# Patient Record
Sex: Female | Born: 1935
Health system: Southern US, Community
[De-identification: ages and names within clinical notes are randomized; demographics above are authoritative.]

## PROBLEM LIST (undated history)

## (undated) DIAGNOSIS — K279 Peptic ulcer, site unspecified, unspecified as acute or chronic, without hemorrhage or perforation: Secondary | ICD-10-CM

## (undated) DIAGNOSIS — M5136 Other intervertebral disc degeneration, lumbar region: Secondary | ICD-10-CM

## (undated) DIAGNOSIS — Z860100 Personal history of colon polyps, unspecified: Secondary | ICD-10-CM

## (undated) DIAGNOSIS — Z9889 Other specified postprocedural states: Secondary | ICD-10-CM

## (undated) DIAGNOSIS — F329 Major depressive disorder, single episode, unspecified: Secondary | ICD-10-CM

## (undated) DIAGNOSIS — T7840XA Allergy, unspecified, initial encounter: Secondary | ICD-10-CM

## (undated) DIAGNOSIS — F419 Anxiety disorder, unspecified: Secondary | ICD-10-CM

## (undated) DIAGNOSIS — Z9289 Personal history of other medical treatment: Secondary | ICD-10-CM

## (undated) DIAGNOSIS — E781 Pure hyperglyceridemia: Secondary | ICD-10-CM

## (undated) DIAGNOSIS — E538 Deficiency of other specified B group vitamins: Secondary | ICD-10-CM

## (undated) DIAGNOSIS — M797 Fibromyalgia: Secondary | ICD-10-CM

## (undated) DIAGNOSIS — F32A Depression, unspecified: Secondary | ICD-10-CM

## (undated) DIAGNOSIS — I2699 Other pulmonary embolism without acute cor pulmonale: Secondary | ICD-10-CM

## (undated) DIAGNOSIS — I5032 Chronic diastolic (congestive) heart failure: Secondary | ICD-10-CM

## (undated) DIAGNOSIS — R7301 Impaired fasting glucose: Secondary | ICD-10-CM

## (undated) DIAGNOSIS — R112 Nausea with vomiting, unspecified: Secondary | ICD-10-CM

## (undated) DIAGNOSIS — I10 Essential (primary) hypertension: Secondary | ICD-10-CM

## (undated) DIAGNOSIS — M169 Osteoarthritis of hip, unspecified: Secondary | ICD-10-CM

## (undated) DIAGNOSIS — I872 Venous insufficiency (chronic) (peripheral): Secondary | ICD-10-CM

## (undated) DIAGNOSIS — M1990 Unspecified osteoarthritis, unspecified site: Secondary | ICD-10-CM

## (undated) DIAGNOSIS — D649 Anemia, unspecified: Secondary | ICD-10-CM

## (undated) DIAGNOSIS — M47818 Spondylosis without myelopathy or radiculopathy, sacral and sacrococcygeal region: Secondary | ICD-10-CM

## (undated) HISTORY — DX: Other specified postprocedural states: Z98.890

## (undated) HISTORY — PX: ABDOMINAL HYSTERECTOMY: SHX81

## (undated) HISTORY — DX: Personal history of other medical treatment: Z92.89

## (undated) HISTORY — PX: JOINT REPLACEMENT: SHX530

## (undated) HISTORY — PX: FRACTURE SURGERY: SHX138

## (undated) HISTORY — PX: CHOLECYSTECTOMY: SHX55

## (undated) HISTORY — DX: Essential (primary) hypertension: I10

## (undated) HISTORY — DX: Anemia, unspecified: D64.9

## (undated) HISTORY — DX: Unspecified osteoarthritis, unspecified site: M19.90

## (undated) HISTORY — DX: Personal history of colon polyps, unspecified: Z86.0100

## (undated) HISTORY — PX: EXTERNAL FIXATION ANKLE FRACTURE: SHX1548

## (undated) HISTORY — DX: Venous insufficiency (chronic) (peripheral): I87.2

## (undated) HISTORY — PX: TONSILLECTOMY: SUR1361

## (undated) HISTORY — PX: TONSILLECTOMY: SHX5217

## (undated) HISTORY — DX: Peptic ulcer, site unspecified, unspecified as acute or chronic, without hemorrhage or perforation: K27.9

## (undated) HISTORY — DX: Other pulmonary embolism without acute cor pulmonale: I26.99

## (undated) HISTORY — DX: Personal history of colonic polyps: Z86.010

## (undated) HISTORY — PX: COLONOSCOPY W/ POLYPECTOMY: SHX1380

## (undated) HISTORY — DX: Deficiency of other specified B group vitamins: E53.8

## (undated) HISTORY — PX: EYE SURGERY: SHX253

## (undated) HISTORY — DX: Pure hyperglyceridemia: E78.1

## (undated) HISTORY — DX: Allergy, unspecified, initial encounter: T78.40XA

## (undated) HISTORY — DX: Impaired fasting glucose: R73.01

## (undated) MED FILL — Sodium Chloride IV Soln 0.9%: INTRAVENOUS | Qty: 250 | Status: AC

---

## 1983-07-07 HISTORY — PX: EXTERNAL FIXATION WRIST FRACTURE: SHX1553

## 2003-10-05 HISTORY — PX: CATARACT EXTRACTION: SUR2

## 2005-02-26 ENCOUNTER — Encounter: Payer: Self-pay | Admitting: Internal Medicine

## 2005-04-28 ENCOUNTER — Ambulatory Visit: Payer: Self-pay | Admitting: Internal Medicine

## 2005-05-13 ENCOUNTER — Ambulatory Visit: Payer: Self-pay | Admitting: Internal Medicine

## 2005-06-11 ENCOUNTER — Ambulatory Visit: Payer: Self-pay | Admitting: Internal Medicine

## 2005-06-30 ENCOUNTER — Ambulatory Visit: Payer: Self-pay | Admitting: Internal Medicine

## 2005-07-13 ENCOUNTER — Ambulatory Visit: Payer: Self-pay | Admitting: Internal Medicine

## 2005-08-14 ENCOUNTER — Ambulatory Visit: Payer: Self-pay | Admitting: Internal Medicine

## 2005-09-17 ENCOUNTER — Ambulatory Visit: Payer: Self-pay | Admitting: Internal Medicine

## 2005-12-16 ENCOUNTER — Ambulatory Visit: Payer: Self-pay | Admitting: Internal Medicine

## 2006-03-09 ENCOUNTER — Ambulatory Visit: Payer: Self-pay | Admitting: Internal Medicine

## 2006-03-29 ENCOUNTER — Ambulatory Visit: Payer: Self-pay | Admitting: Internal Medicine

## 2006-04-08 ENCOUNTER — Ambulatory Visit: Payer: Self-pay | Admitting: Internal Medicine

## 2006-05-14 ENCOUNTER — Ambulatory Visit: Payer: Self-pay | Admitting: Family Medicine

## 2006-07-26 ENCOUNTER — Ambulatory Visit: Payer: Self-pay | Admitting: Gynecology

## 2006-08-24 ENCOUNTER — Ambulatory Visit: Payer: Self-pay | Admitting: Family Medicine

## 2006-09-05 DIAGNOSIS — F4389 Other reactions to severe stress: Secondary | ICD-10-CM | POA: Insufficient documentation

## 2006-09-05 DIAGNOSIS — F438 Other reactions to severe stress: Secondary | ICD-10-CM

## 2006-09-10 ENCOUNTER — Ambulatory Visit: Payer: Self-pay | Admitting: Cardiology

## 2006-09-13 ENCOUNTER — Ambulatory Visit: Payer: Self-pay | Admitting: Internal Medicine

## 2006-09-30 ENCOUNTER — Encounter: Payer: Self-pay | Admitting: Internal Medicine

## 2006-09-30 DIAGNOSIS — E781 Pure hyperglyceridemia: Secondary | ICD-10-CM

## 2006-09-30 DIAGNOSIS — J301 Allergic rhinitis due to pollen: Secondary | ICD-10-CM

## 2006-09-30 DIAGNOSIS — I10 Essential (primary) hypertension: Secondary | ICD-10-CM

## 2006-09-30 DIAGNOSIS — E538 Deficiency of other specified B group vitamins: Secondary | ICD-10-CM

## 2006-09-30 DIAGNOSIS — Z8719 Personal history of other diseases of the digestive system: Secondary | ICD-10-CM

## 2006-10-06 ENCOUNTER — Encounter: Payer: Self-pay | Admitting: Internal Medicine

## 2006-10-06 ENCOUNTER — Ambulatory Visit: Payer: Self-pay

## 2006-10-07 ENCOUNTER — Ambulatory Visit: Payer: Self-pay

## 2006-10-12 ENCOUNTER — Ambulatory Visit: Payer: Self-pay | Admitting: Internal Medicine

## 2006-10-19 ENCOUNTER — Ambulatory Visit: Payer: Self-pay | Admitting: Gynecology

## 2006-10-19 ENCOUNTER — Inpatient Hospital Stay (HOSPITAL_COMMUNITY): Admission: RE | Admit: 2006-10-19 | Discharge: 2006-10-22 | Payer: Self-pay | Admitting: Gynecology

## 2006-10-19 ENCOUNTER — Encounter (INDEPENDENT_AMBULATORY_CARE_PROVIDER_SITE_OTHER): Payer: Self-pay | Admitting: Specialist

## 2006-10-25 ENCOUNTER — Ambulatory Visit: Payer: Self-pay | Admitting: Gynecology

## 2006-11-01 ENCOUNTER — Ambulatory Visit: Payer: Self-pay | Admitting: Gynecology

## 2006-11-15 ENCOUNTER — Ambulatory Visit: Payer: Self-pay | Admitting: Internal Medicine

## 2006-11-22 ENCOUNTER — Ambulatory Visit: Payer: Self-pay | Admitting: Gynecology

## 2006-11-30 ENCOUNTER — Encounter: Payer: Self-pay | Admitting: Internal Medicine

## 2006-12-03 ENCOUNTER — Encounter: Admission: RE | Admit: 2006-12-03 | Discharge: 2006-12-03 | Payer: Self-pay | Admitting: Orthopedic Surgery

## 2006-12-21 ENCOUNTER — Encounter: Admission: RE | Admit: 2006-12-21 | Discharge: 2006-12-21 | Payer: Self-pay | Admitting: Orthopedic Surgery

## 2007-05-20 ENCOUNTER — Ambulatory Visit: Payer: Self-pay | Admitting: Internal Medicine

## 2007-05-20 DIAGNOSIS — Z86010 Personal history of colonic polyps: Secondary | ICD-10-CM | POA: Insufficient documentation

## 2007-05-23 LAB — CONVERTED CEMR LAB
Albumin: 3.7 g/dL (ref 3.5–5.2)
BUN: 12 mg/dL (ref 6–23)
Calcium: 9.3 mg/dL (ref 8.4–10.5)
Eosinophils Absolute: 0.2 10*3/uL (ref 0.0–0.6)
Eosinophils Relative: 2.6 % (ref 0.0–5.0)
GFR calc Af Amer: 91 mL/min
GFR calc non Af Amer: 75 mL/min
Lymphocytes Relative: 19.2 % (ref 12.0–46.0)
MCV: 90.6 fL (ref 78.0–100.0)
Monocytes Relative: 6.1 % (ref 3.0–11.0)
Neutro Abs: 7 10*3/uL (ref 1.4–7.7)
Phosphorus: 3.4 mg/dL (ref 2.3–4.6)
Platelets: 278 10*3/uL (ref 150–400)
Potassium: 3.3 meq/L — ABNORMAL LOW (ref 3.5–5.1)
RBC: 4.12 M/uL (ref 3.87–5.11)
TSH: 0.91 microintl units/mL (ref 0.35–5.50)
WBC: 9.6 10*3/uL (ref 4.5–10.5)

## 2007-08-11 ENCOUNTER — Ambulatory Visit: Payer: Self-pay | Admitting: Internal Medicine

## 2007-08-11 DIAGNOSIS — M159 Polyosteoarthritis, unspecified: Secondary | ICD-10-CM

## 2007-08-11 DIAGNOSIS — I872 Venous insufficiency (chronic) (peripheral): Secondary | ICD-10-CM | POA: Insufficient documentation

## 2007-08-11 DIAGNOSIS — R7301 Impaired fasting glucose: Secondary | ICD-10-CM | POA: Insufficient documentation

## 2007-08-11 LAB — CONVERTED CEMR LAB
Albumin: 3.6 g/dL (ref 3.5–5.2)
BUN: 7 mg/dL (ref 6–23)
CO2: 34 meq/L — ABNORMAL HIGH (ref 19–32)
Creatinine, Ser: 0.8 mg/dL (ref 0.4–1.2)
Eosinophils Absolute: 0.3 10*3/uL (ref 0.0–0.6)
GFR calc non Af Amer: 75 mL/min
Hemoglobin: 12.5 g/dL (ref 12.0–15.0)
Lymphocytes Relative: 19 % (ref 12.0–46.0)
MCV: 87.7 fL (ref 78.0–100.0)
Monocytes Absolute: 0.7 10*3/uL (ref 0.2–0.7)
Monocytes Relative: 7.6 % (ref 3.0–11.0)
Neutro Abs: 6.8 10*3/uL (ref 1.4–7.7)
Phosphorus: 3.4 mg/dL (ref 2.3–4.6)
Platelets: 375 10*3/uL (ref 150–400)
Potassium: 3.2 meq/L — ABNORMAL LOW (ref 3.5–5.1)
Sodium: 144 meq/L (ref 135–145)

## 2007-08-12 ENCOUNTER — Ambulatory Visit: Payer: Self-pay | Admitting: Cardiology

## 2007-09-04 HISTORY — PX: TOTAL KNEE ARTHROPLASTY: SHX125

## 2007-09-14 ENCOUNTER — Encounter: Payer: Self-pay | Admitting: Internal Medicine

## 2007-09-22 ENCOUNTER — Inpatient Hospital Stay (HOSPITAL_COMMUNITY): Admission: RE | Admit: 2007-09-22 | Discharge: 2007-09-27 | Payer: Self-pay | Admitting: Orthopedic Surgery

## 2007-09-23 ENCOUNTER — Ambulatory Visit: Payer: Self-pay | Admitting: *Deleted

## 2007-09-23 ENCOUNTER — Encounter (INDEPENDENT_AMBULATORY_CARE_PROVIDER_SITE_OTHER): Payer: Self-pay | Admitting: Orthopedic Surgery

## 2007-09-27 ENCOUNTER — Encounter: Payer: Self-pay | Admitting: Internal Medicine

## 2007-10-10 ENCOUNTER — Ambulatory Visit: Payer: Self-pay | Admitting: Internal Medicine

## 2007-10-24 ENCOUNTER — Telehealth: Payer: Self-pay | Admitting: Internal Medicine

## 2007-10-25 ENCOUNTER — Ambulatory Visit: Payer: Self-pay | Admitting: Internal Medicine

## 2007-10-25 DIAGNOSIS — G479 Sleep disorder, unspecified: Secondary | ICD-10-CM | POA: Insufficient documentation

## 2007-10-26 LAB — CONVERTED CEMR LAB
ALT: 13 units/L (ref 0–35)
AST: 20 units/L (ref 0–37)
Alkaline Phosphatase: 88 units/L (ref 39–117)
BUN: 12 mg/dL (ref 6–23)
Bilirubin, Direct: 0.1 mg/dL (ref 0.0–0.3)
Creatinine, Ser: 1 mg/dL (ref 0.4–1.2)
Eosinophils Relative: 2.6 % (ref 0.0–5.0)
Glucose, Bld: 117 mg/dL — ABNORMAL HIGH (ref 70–99)
Lymphocytes Relative: 17.6 % (ref 12.0–46.0)
Monocytes Absolute: 0.7 10*3/uL (ref 0.1–1.0)
Monocytes Relative: 7.1 % (ref 3.0–12.0)
Neutrophils Relative %: 72.6 % (ref 43.0–77.0)
Phosphorus: 3.6 mg/dL (ref 2.3–4.6)
Platelets: 302 10*3/uL (ref 150–400)
Potassium: 3.8 meq/L (ref 3.5–5.1)
RDW: 13.6 % (ref 11.5–14.6)
Total Bilirubin: 0.7 mg/dL (ref 0.3–1.2)
WBC: 10.1 10*3/uL (ref 4.5–10.5)

## 2007-11-21 ENCOUNTER — Encounter: Admission: RE | Admit: 2007-11-21 | Discharge: 2007-11-21 | Payer: Self-pay | Admitting: Internal Medicine

## 2007-11-21 ENCOUNTER — Ambulatory Visit: Payer: Self-pay | Admitting: Internal Medicine

## 2007-11-21 DIAGNOSIS — R042 Hemoptysis: Secondary | ICD-10-CM | POA: Insufficient documentation

## 2007-11-21 LAB — CONVERTED CEMR LAB
BUN: 8 mg/dL (ref 6–23)
Basophils Absolute: 0 10*3/uL (ref 0.0–0.1)
Basophils Relative: 0.2 % (ref 0.0–1.0)
Calcium: 9.2 mg/dL (ref 8.4–10.5)
Creatinine, Ser: 0.9 mg/dL (ref 0.4–1.2)
Eosinophils Absolute: 0.2 10*3/uL (ref 0.0–0.7)
Eosinophils Relative: 2.6 % (ref 0.0–5.0)
HCT: 36.5 % (ref 36.0–46.0)
MCHC: 33.3 g/dL (ref 30.0–36.0)
MCV: 88.2 fL (ref 78.0–100.0)
Monocytes Absolute: 0.6 10*3/uL (ref 0.1–1.0)
Phosphorus: 3.6 mg/dL (ref 2.3–4.6)
Platelets: 339 10*3/uL (ref 150–400)
RBC: 4.13 M/uL (ref 3.87–5.11)
WBC: 9.1 10*3/uL (ref 4.5–10.5)

## 2007-12-22 ENCOUNTER — Ambulatory Visit: Payer: Self-pay | Admitting: Internal Medicine

## 2008-01-05 ENCOUNTER — Ambulatory Visit: Payer: Self-pay | Admitting: Internal Medicine

## 2008-01-09 LAB — CONVERTED CEMR LAB
Albumin: 3.3 g/dL — ABNORMAL LOW (ref 3.5–5.2)
BUN: 14 mg/dL (ref 6–23)
CO2: 30 meq/L (ref 19–32)
Calcium: 9.3 mg/dL (ref 8.4–10.5)
Chloride: 103 meq/L (ref 96–112)
Creatinine, Ser: 0.8 mg/dL (ref 0.4–1.2)
GFR calc Af Amer: 91 mL/min
GFR calc non Af Amer: 75 mL/min
Glucose, Bld: 116 mg/dL — ABNORMAL HIGH (ref 70–99)
Phosphorus: 4.2 mg/dL (ref 2.3–4.6)
Potassium: 3.3 meq/L — ABNORMAL LOW (ref 3.5–5.1)
Sodium: 141 meq/L (ref 135–145)

## 2008-02-03 ENCOUNTER — Ambulatory Visit (HOSPITAL_COMMUNITY): Admission: RE | Admit: 2008-02-03 | Discharge: 2008-02-03 | Payer: Self-pay | Admitting: Orthopedic Surgery

## 2008-02-03 ENCOUNTER — Encounter (INDEPENDENT_AMBULATORY_CARE_PROVIDER_SITE_OTHER): Payer: Self-pay | Admitting: Orthopedic Surgery

## 2008-02-03 ENCOUNTER — Ambulatory Visit: Payer: Self-pay | Admitting: Vascular Surgery

## 2008-05-25 ENCOUNTER — Ambulatory Visit: Payer: Self-pay | Admitting: Internal Medicine

## 2008-05-28 LAB — CONVERTED CEMR LAB
AST: 21 units/L (ref 0–37)
Albumin: 3.4 g/dL — ABNORMAL LOW (ref 3.5–5.2)
Alkaline Phosphatase: 63 units/L (ref 39–117)
Basophils Absolute: 0 10*3/uL (ref 0.0–0.1)
Basophils Relative: 0.4 % (ref 0.0–3.0)
CO2: 32 meq/L (ref 19–32)
Calcium: 9 mg/dL (ref 8.4–10.5)
Creatinine, Ser: 0.8 mg/dL (ref 0.4–1.2)
Eosinophils Relative: 6 % — ABNORMAL HIGH (ref 0.0–5.0)
GFR calc non Af Amer: 75 mL/min
Glucose, Bld: 121 mg/dL — ABNORMAL HIGH (ref 70–99)
HDL: 40.7 mg/dL (ref >39.0)
Lymphocytes Relative: 21.7 % (ref 12.0–46.0)
MCHC: 35.2 g/dL (ref 30.0–36.0)
Neutrophils Relative %: 65.8 % (ref 43.0–77.0)
RBC: 3.99 M/uL (ref 3.87–5.11)
Total CHOL/HDL Ratio: 4.3
VLDL: 46 mg/dL — ABNORMAL HIGH (ref 0–40)
WBC: 7.9 10*3/uL (ref 4.5–10.5)

## 2008-10-19 DIAGNOSIS — I517 Cardiomegaly: Secondary | ICD-10-CM

## 2008-10-22 ENCOUNTER — Ambulatory Visit: Payer: Self-pay | Admitting: Cardiovascular Disease

## 2008-10-22 ENCOUNTER — Encounter: Payer: Self-pay | Admitting: Cardiovascular Disease

## 2008-10-22 DIAGNOSIS — R0602 Shortness of breath: Secondary | ICD-10-CM

## 2008-11-06 ENCOUNTER — Encounter: Payer: Self-pay | Admitting: Cardiovascular Disease

## 2008-11-06 ENCOUNTER — Ambulatory Visit: Payer: Self-pay

## 2008-11-22 ENCOUNTER — Ambulatory Visit: Payer: Self-pay | Admitting: Internal Medicine

## 2008-11-26 LAB — CONVERTED CEMR LAB
CO2: 32 meq/L (ref 19–32)
Calcium: 9.2 mg/dL (ref 8.4–10.5)
Chloride: 107 meq/L (ref 96–112)
Potassium: 4 meq/L (ref 3.5–5.1)
Sodium: 142 meq/L (ref 135–145)

## 2009-05-27 ENCOUNTER — Ambulatory Visit: Payer: Self-pay | Admitting: Internal Medicine

## 2009-05-29 LAB — CONVERTED CEMR LAB
ALT: 17 units/L (ref 0–35)
AST: 23 units/L (ref 0–37)
Basophils Relative: 0.3 % (ref 0.0–3.0)
Bilirubin, Direct: 0.1 mg/dL (ref 0.0–0.3)
CO2: 32 meq/L (ref 19–32)
Calcium: 8.9 mg/dL (ref 8.4–10.5)
Chloride: 104 meq/L (ref 96–112)
Eosinophils Relative: 3.5 % (ref 0.0–5.0)
HCT: 39.6 % (ref 36.0–46.0)
Lymphs Abs: 1.8 10*3/uL (ref 0.7–4.0)
MCV: 94.4 fL (ref 78.0–100.0)
Monocytes Absolute: 0.6 10*3/uL (ref 0.1–1.0)
Monocytes Relative: 7.8 % (ref 3.0–12.0)
Potassium: 4 meq/L (ref 3.5–5.1)
RBC: 4.2 M/uL (ref 3.87–5.11)
Sodium: 143 meq/L (ref 135–145)
Total Protein: 6.2 g/dL (ref 6.0–8.3)
WBC: 8.3 10*3/uL (ref 4.5–10.5)

## 2009-06-05 HISTORY — PX: HEMIARTHROPLASTY SHOULDER FRACTURE: SUR653

## 2009-06-05 HISTORY — PX: FEMUR FRACTURE SURGERY: SHX633

## 2009-06-08 ENCOUNTER — Inpatient Hospital Stay (HOSPITAL_COMMUNITY): Admission: EM | Admit: 2009-06-08 | Discharge: 2009-06-14 | Payer: Self-pay | Admitting: Emergency Medicine

## 2009-06-10 ENCOUNTER — Telehealth: Payer: Self-pay | Admitting: Internal Medicine

## 2009-08-22 ENCOUNTER — Ambulatory Visit: Payer: Self-pay | Admitting: Internal Medicine

## 2009-08-22 DIAGNOSIS — S7290XA Unspecified fracture of unspecified femur, initial encounter for closed fracture: Secondary | ICD-10-CM | POA: Insufficient documentation

## 2009-10-21 ENCOUNTER — Encounter: Payer: Self-pay | Admitting: Internal Medicine

## 2009-11-18 ENCOUNTER — Ambulatory Visit: Payer: Self-pay | Admitting: Internal Medicine

## 2009-11-18 DIAGNOSIS — R21 Rash and other nonspecific skin eruption: Secondary | ICD-10-CM

## 2009-11-19 LAB — CONVERTED CEMR LAB
Albumin: 3.8 g/dL (ref 3.5–5.2)
Alkaline Phosphatase: 119 units/L — ABNORMAL HIGH (ref 39–117)
Basophils Absolute: 0 10*3/uL (ref 0.0–0.1)
Basophils Relative: 0.4 % (ref 0.0–3.0)
Bilirubin, Direct: 0.2 mg/dL (ref 0.0–0.3)
Creatinine, Ser: 0.8 mg/dL (ref 0.4–1.2)
Eosinophils Absolute: 0.5 10*3/uL (ref 0.0–0.7)
Glucose, Bld: 104 mg/dL — ABNORMAL HIGH (ref 70–99)
Lymphocytes Relative: 22.9 % (ref 12.0–46.0)
MCHC: 33.5 g/dL (ref 30.0–36.0)
MCV: 90.1 fL (ref 78.0–100.0)
Monocytes Absolute: 0.7 10*3/uL (ref 0.1–1.0)
Neutrophils Relative %: 64 % (ref 43.0–77.0)
Phosphorus: 3.5 mg/dL (ref 2.3–4.6)
Platelets: 282 10*3/uL (ref 150.0–400.0)
Potassium: 3.9 meq/L (ref 3.5–5.1)
RBC: 4.24 M/uL (ref 3.87–5.11)
Sodium: 142 meq/L (ref 135–145)
Total Bilirubin: 0.5 mg/dL (ref 0.3–1.2)

## 2010-01-13 ENCOUNTER — Ambulatory Visit: Payer: Self-pay | Admitting: Internal Medicine

## 2010-01-13 LAB — CONVERTED CEMR LAB
Bilirubin Urine: NEGATIVE
Blood in Urine, dipstick: NEGATIVE
Ketones, urine, test strip: NEGATIVE
Nitrite: NEGATIVE
Protein, U semiquant: NEGATIVE
Urobilinogen, UA: 0.2

## 2010-04-10 ENCOUNTER — Ambulatory Visit: Payer: Self-pay | Admitting: Cardiovascular Disease

## 2010-04-16 ENCOUNTER — Encounter: Payer: Self-pay | Admitting: Internal Medicine

## 2010-05-23 ENCOUNTER — Ambulatory Visit: Payer: Self-pay | Admitting: Internal Medicine

## 2010-06-03 ENCOUNTER — Encounter: Admission: RE | Admit: 2010-06-03 | Discharge: 2010-06-03 | Payer: Self-pay | Admitting: Orthopedic Surgery

## 2010-07-16 ENCOUNTER — Ambulatory Visit
Admission: RE | Admit: 2010-07-16 | Discharge: 2010-07-16 | Payer: Self-pay | Source: Home / Self Care | Attending: Family Medicine | Admitting: Family Medicine

## 2010-08-05 NOTE — Assessment & Plan Note (Signed)
Summary: 6 MONTH FOLLOW UP/[facility]   Vital Signs:  Patient profile:   76 year old female Weight:      256 pounds O2 Sat:      95 % on Room air Temp:     98.2 degrees F oral Pulse rate:   93 / minute Pulse rhythm:   regular BP sitting:   130 / 60  (left arm) Cuff size:   large  Vitals Entered By: Mervin Hack CMA Duncan Dull) (May 23, 2010 12:07 PM)  O2 Flow:  Room air CC: 6 month follow-up   History of Present Illness: "as good as I am going to get" Seeing Dr Carola Frost soon--will be getting right shoulder hardware out--should help mobility Still with pain in left femur Limited but thinks she is as good as it  is going to get Does try to walk around the house and walks her [employer]  No chest pain  No SOB edema is better  Chronic insomnia for a long time sleeps best in morning  5AM to 11AM or so temazepam does help some--uses every night  Allergies: 1)  Codeine Sulfate (Codeine Sulfate) 2)  Celebrex (Celecoxib) 3)  Erythromycin Base (Erythromycin Base) 4)  * Gold Shots 5)  * Pain Medications  Past History:  Past medical, surgical, family and social histories (including risk factors) reviewed for relevance to current acute and chronic problems.  Past Medical History: Reviewed history from 11/22/2008 and no changes required. Left ventricular hypertrophy Chronic venous insufficiency Hypertriglyceridemia Hypertension Impaired fasting glucose Osteoarthritis---------------------------------------------Dr Rendall Colonic polyps Gastric ulcer from NSAID Vitamin B12 deficiency Allergic rhinitis Sleep disorder  Past Surgical History: Reviewed history from 08/22/2009 and no changes required. Cholecystectomy 1977 Tonsillectomy 1952 Fx.  left wrist-fixation with pins 1985 Fx.  left ankle-fixation with pins later removed sec to infection 1985 Cataract removal OD 10/2003 LE doppler studies neg 12/06/02 Echo- EF 60% LVH 4/05 Adenosine Myo EF 70% Hysterectomy done 4/08  for breakthrough bleeding 3/09 Left TKR (Rendall) 12/10  Fractured right shoulder and left femur  Family History: Reviewed history from 05/20/2007 and no changes required. Dad died @74 ---COPD, asbestosis Mom died @84 --dementia CAD and HTN in family DM--maternal aunts Prostate cancer--brother--died 12/06/2006 Depression-mother Uterine cancer--mom (age 39)  2 brothers---1 has arthritis and asthma, hepatitis  Social History: Reviewed history from 05/20/2007 and no changes required. Retired  crossing Energy manager daughters Former Smoker--quit around Dec 05, 1993 Alcohol use-yes---rare Enjoys arts and crafts  Review of Systems       appetite is too good weight is up a few pounds  Physical Exam  General:  alert and normal appearance.   Neck:  supple, no masses, no thyromegaly, and no cervical lymphadenopathy.   Lungs:  normal respiratory effort, no intercostal retractions, no accessory muscle use, and normal breath sounds.   Heart:  normal rate, regular rhythm, no murmur, and no gallop.   Abdomen:  soft and non-tender.   Extremities:  no edema Psych:  normally interactive, good eye contact, not anxious appearing, and not depressed appearing.     Impression & Recommendations:  Problem # 1:  OSTEOARTHRITIS (ICD-715.90) Assessment Unchanged ongoing pain and disability discussed increasing her regular activity  The following medications were removed from the medication list:    Pentazocine-naloxone Hcl 50-0.5 Mg Tabs (Pentazocine-naloxone) .Marland Kitchen... As needed Her updated medication list for this problem includes:    Tramadol Hcl 50 Mg Tabs (Tramadol hcl) .Marland Kitchen... 1 by mouth three times a day as needed for pain    Tylenol Extra  Strength 500 Mg Tabs (Acetaminophen) .Marland Kitchen... Take 1-2 by mouth once daily as needed  Problem # 2:  HYPERTENSION (ICD-401.9) Assessment: Unchanged good control labs next time  Her updated medication list for this problem includes:    Triamterene-hctz 37.5-25 Mg  Tabs (Triamterene-hctz) .Marland Kitchen... Take 1 tablet by mouth once a day  BP today: 130/60 Prior BP: 122/70 (04/10/2010)  Prior 10 Yr Risk Heart Disease: Not enough information (11/15/2006)  Labs Reviewed: K+: 3.9 (11/18/2009) Creat: : 0.8 (11/18/2009)   Chol: 175 (05/25/2008)   HDL: 40.7 (05/25/2008)   LDL: DEL (05/25/2008)   TG: 232 (05/25/2008)  Problem # 3:  SLEEP DISORDER (ICD-780.50) Assessment: Comment Only does fair with the temazepam  Complete Medication List: 1)  Klor-con M20 20 Meq Tbcr (Potassium chloride crys cr) .... Take 2 by mouth every am 2)  Triamterene-hctz 37.5-25 Mg Tabs (Triamterene-hctz) .... Take 1 tablet by mouth once a day 3)  Tramadol Hcl 50 Mg Tabs (Tramadol hcl) .Marland Kitchen.. 1 by mouth three times a day as needed for pain 4)  Tylenol Extra Strength 500 Mg Tabs (Acetaminophen) .... Take 1-2 by mouth once daily as needed 5)  Temazepam 30 Mg Caps (Temazepam) .Marland Kitchen.. 1 cap at bedtime as needed 6)  Allegra Allergy 180 Mg Tabs (Fexofenadine hcl) .... Take 1 by mouth once daily  Patient Instructions: 1)  Please schedule a follow-up appointment in 6 months .  Prescriptions: ALLEGRA ALLERGY 180 MG TABS (FEXOFENADINE HCL) take 1 by mouth once daily  #90 x 3   Entered by:   Mervin Hack CMA (AAMA)   Authorized by:   Cindee Salt MD   Signed by:   Mervin Hack CMA (AAMA) on 05/23/2010   Method used:   [facility]        [facility] [id number]       [address]       [place], [place]  [address]       Ph: [phone] or [phone]       Fax: [phone]   RxID:   [id number]    Orders Added: 1)  Est. Patient Level IV [10932]    Current Allergies (reviewed today): CODEINE SULFATE (CODEINE SULFATE) CELEBREX (CELECOXIB) ERYTHROMYCIN BASE (ERYTHROMYCIN BASE) * GOLD SHOTS * PAIN MEDICATIONS

## 2010-08-05 NOTE — Assessment & Plan Note (Signed)
Summary: rov   Visit Type:  rov Primary Gamal Todisco:  Cindee Salt MD  CC:  None.  History of Present Illness: 76 yo WF with history of obesity, HTN and chronic lower extremity edema here today for a routine visit.   She has a remote history of tobacco abuse. She has had a normal myoview stress test in April of 2008. I saw her last spring and she had complaints of lower ext edema. Her echo showed normal LV function.   She is here today for follow up. She has chronic lower extremity edema but this is actually improved. She has not needed to use her Lasix. She has had no chest pain, SOB or palpitations. In December, she fell and broke her right shoulder and left femur. She has recovered from this. Echo May 2010 with normal LV function and no significant valvular abnormalities.   Current Medications (verified): 1)  Klor-Con M20 20 Meq Tbcr (Potassium Chloride Crys Cr) .... Take 2 By Mouth Every Am 2)  Triamterene-Hctz 37.5-25 Mg  Tabs (Triamterene-Hctz) .... Take 1 Tablet By Mouth Once A Day 3)  Fexofenadine Hcl 180 Mg Tabs (Fexofenadine Hcl) .Marland Kitchen.. 1 Daily As Needed For Allergies 4)  Tramadol Hcl 50 Mg Tabs (Tramadol Hcl) .Marland Kitchen.. 1 By Mouth Three Times A Day As Needed For Pain 5)  Pentazocine-Naloxone Hcl 50-0.5 Mg Tabs (Pentazocine-Naloxone) .... As Needed 6)  Tylenol Extra Strength 500 Mg Tabs (Acetaminophen) .... Take 1-2 By Mouth Once Daily As Needed 7)  Temazepam 30 Mg Caps (Temazepam) .Marland Kitchen.. 1 Cap At Bedtime As Needed  Allergies: 1)  Codeine Sulfate (Codeine Sulfate) 2)  Celebrex (Celecoxib) 3)  Erythromycin Base (Erythromycin Base) 4)  * Gold Shots 5)  * Pain Medications  Past History:  Past Medical History: Reviewed history from 11/22/2008 and no changes required. Left ventricular hypertrophy Chronic venous insufficiency Hypertriglyceridemia Hypertension Impaired fasting glucose Osteoarthritis---------------------------------------------Dr Rendall Colonic polyps Gastric  ulcer from NSAID Vitamin B12 deficiency Allergic rhinitis Sleep disorder  Social History: Reviewed history from 05/20/2007 and no changes required. Retired  crossing Energy manager daughters Former Smoker--quit around [employer] Alcohol use-yes---rare Enjoys arts and crafts  Review of Systems  The patient denies fatigue, malaise, fever, weight gain/loss, vision loss, decreased hearing, hoarseness, chest pain, palpitations, shortness of breath, prolonged cough, wheezing, sleep apnea, coughing up blood, abdominal pain, blood in stool, nausea, vomiting, diarrhea, heartburn, incontinence, blood in urine, muscle weakness, joint pain, leg swelling, rash, skin lesions, headache, fainting, dizziness, depression, anxiety, enlarged lymph nodes, easy bruising or bleeding, and environmental allergies.    Vital Signs:  Patient profile:   76 year old female Height:      62 inches Weight:      250.8 pounds BMI:     46.04 Pulse rate:   80 / minute Pulse rhythm:   irregular BP sitting:   122 / 70  (left arm) Cuff size:   large  Vitals Entered By: Danielle Rankin, CMA (April 10, 2010 3:37 PM)  Physical Exam  General:  General: Well developed, well nourished, NAD Musculoskeletal: Muscle strength 5/5 all ext Psychiatric: Mood and affect normal Neck: No JVD, no carotid bruits, no thyromegaly, no lymphadenopathy. Lungs:Clear bilaterally, no wheezes, rhonci, crackles CV: RRR no murmurs, gallops rubs Abdomen: soft, NT, ND, BS present Extremities: 1+ bilateral lower ext  edema, pulses 2+.    EKG  Procedure date:  04/10/2010  Findings:      NSR, rate 80 bpm. Poor R wave progression.   Impression &  Recommendations:  Problem # 1:  HYPERTENSION (ICD-401.9) BP is well controlled. She has stable lower ext edema. No further workup at this time.   Her updated medication list for this problem includes:    Triamterene-hctz 37.5-25 Mg Tabs (Triamterene-hctz) .Marland Kitchen... Take 1 tablet by mouth once a  day  Patient Instructions: 1)  Your physician recommends that you schedule a follow-up appointment in: 1 year 2)  Your physician recommends that you continue on your current medications as directed. Please refer to the Current Medication list given to you today.

## 2010-08-05 NOTE — Assessment & Plan Note (Signed)
Summary: 6 M F/U DLO   Vital Signs:  Patient profile:   76 year old female Weight:      261 pounds Temp:     98.1 degrees F oral Pulse rate:   76 / minute Pulse rhythm:   regular BP sitting:   110 / 60  (left arm) Cuff size:   large  Vitals Entered By: Mervin Hack CMA Duncan Dull) (Nov 18, 2009 12:14 PM) CC: 6 month follow-up   History of Present Illness: doesn't feel she has recovered from leg or shoulder fractures Ortho is considering going back in and removing hardware--limiting motion and may be adding to ongoing pain Taking tramadol mainly. Rarely uses stronger med (mostly at night)  Rough time lately at night generally does okay until past 2 nights  Having terrible rash on arms and legs very itchy cortisone creams no help has had same rash in past and prednisone has cleared (diagnosed with psoriaform rash)  No trouble with BP No chest pain No SOB  Allergies: 1)  Codeine Sulfate (Codeine Sulfate) 2)  Celebrex (Celecoxib) 3)  Erythromycin Base (Erythromycin Base) 4)  * Gold Shots 5)  * Pain Medications  Past History:  Past medical, surgical, family and social histories (including risk factors) reviewed for relevance to current acute and chronic problems.  Past Medical History: Reviewed history from 11/22/2008 and no changes required. Left ventricular hypertrophy Chronic venous insufficiency Hypertriglyceridemia Hypertension Impaired fasting glucose Osteoarthritis---------------------------------------------Dr Rendall Colonic polyps Gastric ulcer from NSAID Vitamin B12 deficiency Allergic rhinitis Sleep disorder  Past Surgical History: Reviewed history from 08/22/2009 and no changes required. Cholecystectomy 1977 Tonsillectomy 1952 Fx.  left wrist-fixation with pins 1985 Fx.  left ankle-fixation with pins later removed sec to infection 1985 Cataract removal OD 10/2003 LE doppler studies neg 12-03-02 Echo- EF 60% LVH 4/05 Adenosine Myo EF  70% Hysterectomy done 4/08 for breakthrough bleeding 3/09 Left TKR (Rendall) 12/10  Fractured right shoulder and left femur  Family History: Reviewed history from 05/20/2007 and no changes required. Dad died @74 ---COPD, asbestosis Mom died @84 --dementia CAD and HTN in family DM--maternal aunts Prostate cancer--brother--died 12-03-06 Depression-mother Uterine cancer--mom (age 4)  2 brothers---1 has arthritis and asthma, hepatitis  Social History: Reviewed history from 05/20/2007 and no changes required. 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   Neck:  supple, no masses, no thyromegaly, no carotid bruits, and no cervical lymphadenopathy.   Lungs:  normal respiratory effort and normal breath sounds.   Heart:  normal rate, regular rhythm, no murmur, and no gallop.   Extremities:  no sig edema Skin:  excoriated lesions on left arm and right leg very pruritic Psych:  normally interactive, good eye contact, not anxious appearing, and not depressed appearing.     Impression & Recommendations:  Problem # 1:  RASH AND OTHER NONSPECIFIC SKIN ERUPTION (ICD-782.1) Assessment New most likely neurodermatitis but I checked husband due to pruritic rash with widespread papules  my concern is scabies though given their both being involved  if permethrin doesn't work, will need derm eval will treat with prednisone for the itching and in case it is neuroderm  Problem # 2:  HYPERTENSION (ICD-401.9) Assessment: Unchanged  good control due for labs  The following medications were  removed from the medication list:    Furosemide 40 Mg Tabs (Furosemide) .Marland Kitchen... 1 daily as needed in morning for swelling in feet Her updated medication list  for this problem includes:    Triamterene-hctz 37.5-25 Mg Tabs (Triamterene-hctz) .Marland Kitchen... Take 1 tablet by mouth once a day  BP today: 110/60 Prior BP: 148/80 (08/22/2009)  Prior 10 Yr Risk Heart Disease: Not enough information (11/15/2006)  Labs Reviewed: K+: 4.0 (05/27/2009) Creat: : 1.0 (05/27/2009)   Chol: 175 (05/25/2008)   HDL: 40.7 (05/25/2008)   LDL: DEL (05/25/2008)   TG: 232 (05/25/2008)  Orders: TLB-Renal Function Panel (80069-RENAL) TLB-CBC Platelet - w/Differential (85025-CBCD) TLB-Hepatic/Liver Function Pnl (80076-HEPATIC) TLB-TSH (Thyroid Stimulating Hormone) (84443-TSH) Venipuncture (75643)  Problem # 3:  OSTEOARTHRITIS (ICD-715.90) Assessment: Deteriorated more ongoing pain from fractures follow up with ortho  Her updated medication list for this problem includes:    Tylenol Extra Strength 500 Mg Tabs (Acetaminophen) .Marland Kitchen... Take 1-2 by mouth once daily as needed    Tramadol Hcl 50 Mg Tabs (Tramadol hcl) .Marland Kitchen... 1 by mouth three times a day as needed for pain    Pentazocine-naloxone Hcl 50-0.5 Mg Tabs (Pentazocine-naloxone) .Marland Kitchen... Pt not sure of strength  Problem # 4:  VENOUS INSUFFICIENCY, CHRONIC (ICD-459.81) Assessment: Unchanged edema reasonably controlled  Complete Medication List: 1)  Klor-con M20 20 Meq Tbcr (Potassium chloride crys cr) .... Take 2 by mouth every am 2)  Triamterene-hctz 37.5-25 Mg Tabs (Triamterene-hctz) .... Take 1 tablet by mouth once a day 3)  Tylenol Extra Strength 500 Mg Tabs (Acetaminophen) .... Take 1-2 by mouth once daily as needed 4)  Fexofenadine Hcl 180 Mg Tabs (Fexofenadine hcl) .Marland Kitchen.. 1 daily as needed for allergies 5)  Tramadol Hcl 50 Mg Tabs (Tramadol hcl) .Marland Kitchen.. 1 by mouth three times a day as needed for pain 6)  Pentazocine-naloxone Hcl 50-0.5 Mg Tabs (Pentazocine-naloxone) .... Pt not sure of strength 7)  Permethrin 5 % Crea (Permethrin) .... Apply from neck down tonight and wash off tomorrow morning 8)  Prednisone 20 Mg Tabs  (Prednisone) .... 2 tablets daily for 1 week, then 1 tablet daily for 1 week for itching  Patient Instructions: 1)  Please schedule a follow-up appointment in 6 months .  Prescriptions: PREDNISONE 20 MG TABS (PREDNISONE) 2 tablets daily for 1 week, then 1 tablet daily for 1 week for itching  #21 x 0   Entered and Authorized by:   Cindee Salt MD   Signed by:   Cindee Salt MD on 11/18/2009   Method used:   [facility]        [facility] [id number]       [address]       [place], [place]  [address]       Ph: [phone] or [phone]       Fax: [phone]   RxID:   [id number] PERMETHRIN 5 % CREA (PERMETHRIN) apply from neck down tonight and wash off tomorrow morning  #1 bottle x 1   Entered and Authorized by:   Cindee Salt MD   Signed by:   Cindee Salt MD on 11/18/2009   Method used:   [facility]        [facility] [facility]ail)       [address]       [place], [place]  [address]       Ph: [phone] or [phone]       Fax: [phone]   RxID:  [id number]   Current Allergies (reviewed today): CODEINE SULFATE (CODEINE SULFATE) CELEBREX (CELECOXIB) ERYTHROMYCIN BASE (ERYTHROMYCIN BASE) * GOLD SHOTS * PAIN MEDICATIONS

## 2010-08-05 NOTE — Letter (Signed)
Summary: CMN for Bed Wheelchair & Commode/[facility]  CMN for Bed Wheelchair & Commode/[facility]   Imported By: Lanelle Bal 10/24/2009 08:16:33  _____________________________________________________________________  External Attachment:    Type:   Image     Comment:   External Document

## 2010-08-05 NOTE — Medication Information (Signed)
Summary: Order for Heavy Duty Wheelchair/[facility]  Order for Heavy Duty Wheelchair/[facility]   Imported By: Maryln Gottron 04/23/2010 14:57:34  _____________________________________________________________________  External Attachment:    Type:   Image     Comment:   External Document

## 2010-08-05 NOTE — Assessment & Plan Note (Signed)
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 Repaired by Dr Carola Frost to [facility] for rehab 1/9 - 1/28  Now getting home rehab through [facility] Not able to walk much Stands to tranfer Independent transferring onto toilet Dresses herself Can't shower because it is up the stairs at her home has hospital bed and bedside cammode  satisfied with tramadol for pain  Allergies: 1)  Codeine Sulfate (Codeine Sulfate) 2)  Celebrex (Celecoxib) 3)  Erythromycin Base (Erythromycin Base) 4)  * Gold Shots 5)  * Pain Medications  Past History:  Past medical, surgical, family and social histories (including risk factors) reviewed, and no changes noted (except as noted below).  Past Medical History: Reviewed history from 11/22/2008 and no changes required. Left ventricular hypertrophy Chronic venous insufficiency Hypertriglyceridemia Hypertension Impaired fasting glucose Osteoarthritis---------------------------------------------Dr Rendall Colonic polyps Gastric ulcer from NSAID Vitamin B12 deficiency Allergic rhinitis Sleep disorder  Past Surgical History: Cholecystectomy 1977 Tonsillectomy 1952 Fx.  left wrist-fixation with pins 1985 Fx.  left ankle-fixation with pins later removed sec to infection 1985 Cataract removal OD 10/2003 LE doppler studies neg 11/17/2002 Echo- EF 60% LVH 4/05 Adenosine Myo EF 70% Hysterectomy done 4/08 for breakthrough bleeding 3/09 Left TKR (Rendall) 12/10  Fractured right shoulder and left  femur  Family History: Reviewed history from 05/20/2007 and no changes required. Dad died @74 ---COPD, asbestosis Mom died @84 --dementia CAD and HTN in family DM--maternal aunts Prostate cancer--brother--died 11/17/06 Depression-mother Uterine cancer--mom (age 55)  2 brothers---1 has arthritis and asthma, hepatitis  Social History: Reviewed history from 05/20/2007 and no changes required. Retired  crossing Energy manager daughters Former Smoker--quit around 1993/11/16 Alcohol use-yes---rare Enjoys arts and crafts  Review of Systems       appetite is okay Lost 16# with the hospital and rehab stay some trouble sleeping ---hospital bed not comfortable  Physical Exam  General:  alert and normal appearance.   Neck:  supple, no masses, and no thyromegaly.   Lungs:  normal respiratory effort and normal breath sounds.   Heart:  normal rate, regular rhythm, no murmur, and no gallop.   Extremities:  thick calves without pitting Skin:  slight redness in distal part of left 3rd toe not warm or tender Psych:  normally interactive and good eye contact.     Impression & Recommendations:  Problem # 1:  FRACTURE, FEMUR (ICD-821.00) Assessment New still on home rehab slow improvement tramadol for pain   Problem # 2:  HYPERTENSION (ICD-401.9) Assessment: Unchanged reasonable control  no changes needed  Her updated medication list for this problem includes:    Triamterene-hctz 37.5-25 Mg Tabs (Triamterene-hctz) .Marland Kitchen... Take 1 tablet by mouth once a day    Furosemide 40 Mg Tabs (Furosemide) .Marland Kitchen... 1 daily as needed in morning for swelling in feet  BP today: 148/80 Prior BP: 150/88 (05/27/2009)  Prior 10 Yr Risk Heart Disease: Not enough information (11/15/2006)  Labs Reviewed:  K+: 4.0 (05/27/2009) Creat: : 1.0 (05/27/2009)   Chol: 175 (05/25/2008)   HDL: 40.7 (05/25/2008)   LDL: DEL (05/25/2008)   TG: 232 (05/25/2008)  Problem # 3:  VENOUS INSUFFICIENCY, CHRONIC  (ICD-459.81) Assessment: Unchanged stable mild edema  Complete Medication List: 1)  Klor-con M20 20 Meq Tbcr (Potassium chloride crys cr) .... Take 2 by mouth every am 2)  Triamterene-hctz 37.5-25 Mg Tabs (Triamterene-hctz) .... Take 1 tablet by mouth once a day 3)  Temazepam 15 Mg Caps (Temazepam) .Marland Kitchen.. 1-2 at bedtime as needed to help sleep 4)  Furosemide 40 Mg Tabs (Furosemide) .Marland Kitchen.. 1 daily as needed in morning for swelling in feet 5)  Tylenol Extra Strength 500 Mg Tabs (Acetaminophen) .... Take 1-2 by mouth once daily as needed 6)  Fexofenadine Hcl 180 Mg Tabs (Fexofenadine hcl) .Marland Kitchen.. 1 daily as needed for allergies 7)  Tramadol Hcl 50 Mg Tabs (Tramadol hcl) .Marland Kitchen.. 1 by mouth three times a day as needed for pain  Patient Instructions: 1)  Please schedule a follow-up appointment in 4 months .  2)  Okay to give new patient appt to grandson Ronaldo Miyamoto (he is on Medicaid)  Current Allergies (reviewed today): CODEINE SULFATE (CODEINE SULFATE) CELEBREX (CELECOXIB) ERYTHROMYCIN BASE (ERYTHROMYCIN BASE) * GOLD SHOTS * PAIN MEDICATIONS

## 2010-08-05 NOTE — Assessment & Plan Note (Signed)
Summary: SICK SINCE FRI/DLO   Vital Signs:  Patient profile:   76 year old female Weight:      251 pounds O2 Sat:      97 % on Room air Temp:     98.5 degrees F tympanic Pulse rate:   78 / minute Pulse rhythm:   regular BP sitting:   128 / 60  (left arm) Cuff size:   large  Vitals Entered By: Mervin Hack CMA (AAMA) (January 13, 2010 11:21 AM)  O2 Flow:  Room air CC: weak, nausea   History of Present Illness: "I just don't feel good" Weak, and doesn't want to do anything Nausea but still can eat Started 3 days ago  Fever--low grade--at first No cough or SOB Notes slight restriction in taking deep breath but can take on  No dysuria same urgency though  No rashes  Does not red mark on back of right ankle  No diarrhea but stomach feels off  Allergies: 1)  Codeine Sulfate (Codeine Sulfate) 2)  Celebrex (Celecoxib) 3)  Erythromycin Base (Erythromycin Base) 4)  * Gold Shots 5)  * Pain Medications  Past History:  Past medical, surgical, family and social histories (including risk factors) reviewed for relevance to current acute and chronic problems.  Past Medical History: Reviewed history from 11/22/2008 and no changes required. Left ventricular hypertrophy Chronic venous insufficiency Hypertriglyceridemia Hypertension Impaired fasting glucose Osteoarthritis---------------------------------------------Dr Rendall Colonic polyps Gastric ulcer from NSAID Vitamin B12 deficiency Allergic rhinitis Sleep disorder  Past Surgical History: Reviewed history from 08/22/2009 and no changes required. Cholecystectomy 1977 Tonsillectomy 1952 Fx.  left wrist-fixation with pins 1985 Fx.  left ankle-fixation with pins later removed sec to infection 1985 Cataract removal OD 10/2003 LE doppler studies neg 12-10-2002 Echo- EF 60% LVH 4/05 Adenosine Myo EF 70% Hysterectomy done 4/08 for breakthrough bleeding 3/09 Left TKR (Rendall) 12/10  Fractured right shoulder and left  femur  Family History: Reviewed history from 05/20/2007 and no changes required. Dad died @74 ---COPD, asbestosis Mom died @84 --dementia CAD and HTN in family DM--maternal aunts Prostate cancer--brother--died 2006-12-10 Depression-mother Uterine cancer--mom (age 77)  2 brothers---1 has arthritis and asthma, hepatitis  Social History: Reviewed history from 05/20/2007 and no changes required. Retired  crossing Energy manager daughters Former Smoker--quit around [employer] Alcohol use-yes---rare Enjoys arts and crafts  Review of Systems       Has lost 10# in the past 2 months---trying to limit sweets sleeping okay---still a night owl  Physical Exam  General:  alert.  NAD Mouth:  no erythema and no exudates.   Neck:  supple, no masses, and no cervical lymphadenopathy.   Lungs:  normal respiratory effort, no intercostal retractions, no accessory muscle use, and normal breath sounds.   Abdomen:  soft, non-tender, and normal bowel sounds.   Msk:  low back pain--chronic difficult to lie down on table Skin:  mild redness posterior right ankle---not infected Psych:  normally interactive, good eye contact, not anxious appearing, and not depressed appearing.     Impression & Recommendations:  Problem # 1:  NAUSEA (ICD-787.02) Assessment New  and generally feeling poorly exam is unrevealing urine negative onset 3 days ago---seems like self limited illness---there has been some intestinal infecitons locally  P: advised on supportive care     no Rx for now     consider reeval if not improving over the next couple of days  Orders: UA Dipstick w/o Micro (manual) (57846)  Complete Medication List: 1)  Klor-con M20 20 Meq Tbcr (  Potassium chloride crys cr) .... Take 2 by mouth every am 2)  Triamterene-hctz 37.5-25 Mg Tabs (Triamterene-hctz) .... Take 1 tablet by mouth once a day 3)  Fexofenadine Hcl 180 Mg Tabs (Fexofenadine hcl) .Marland Kitchen.. 1 daily as needed for allergies 4)  Tramadol Hcl 50  Mg Tabs (Tramadol hcl) .Marland Kitchen.. 1 by mouth three times a day as needed for pain 5)  Pentazocine-naloxone Hcl 50-0.5 Mg Tabs (Pentazocine-naloxone) .... Pt not sure of strength 6)  Tylenol Extra Strength 500 Mg Tabs (Acetaminophen) .... Take 1-2 by mouth once daily as needed  Patient Instructions: 1)  Please schedule a follow-up appointment as needed .   Current Allergies (reviewed today): CODEINE SULFATE (CODEINE SULFATE) CELEBREX (CELECOXIB) ERYTHROMYCIN BASE (ERYTHROMYCIN BASE) * GOLD SHOTS * PAIN MEDICATIONS  Laboratory Results   Urine Tests  Date/Time Received: January 13, 2010 12:04 PM Date/Time Reported: January 13, 2010 12:04 PM  Routine Urinalysis   Color: yellow Appearance: Hazy Glucose: negative   (Normal Range: Negative) Bilirubin: negative   (Normal Range: Negative) Ketone: negative   (Normal Range: Negative) Spec. Gravity: 1.015   (Normal Range: 1.003-1.035) Blood: negative   (Normal Range: Negative) pH: 6.5   (Normal Range: 5.0-8.0) Protein: negative   (Normal Range: Negative) Urobilinogen: 0.2   (Normal Range: 0-1) Nitrite: negative   (Normal Range: Negative) Leukocyte Esterace: negative   (Normal Range: Negative)

## 2010-08-07 NOTE — Assessment & Plan Note (Signed)
Summary: ??bronchitis/alc   Vital Signs:  Patient profile:   76 year old female Weight:      253.50 pounds O2 Sat:      96 % on Room air Temp:     97.9 degrees F oral Pulse rate:   80 / minute Pulse rhythm:   regular BP sitting:   134 / 62  (left arm) Cuff size:   large  Vitals Entered By: Selena Batten Dance CMA (AAMA) (July 16, 2010 9:33 AM)  O2 Flow:  Room air CC: ? Bronchitis x3 weeks   History of Present Illness: CC: recurrent bronchitis?  3wk h/o recurrent bronchitis, Started with chest congestion and nausea with some sinus pressure.  Also with chest tightness and chills.   myalgias.  s/p predpack she had laying at home, finished yesterday.  Also taking tylenol and tramadol for joint pains.  cough productive of green sputum.  + dull sinus headache.  delsym  No fevers, abd pain, v/d, rashes.  No ear pain/tooth pain.  + sick contacts at home - daughter, grandson  No smokers at home, no h/o asthma/COPD  Current Medications (verified): 1)  Klor-Con M20 20 Meq Tbcr (Potassium Chloride Crys Cr) .... Take 2 By Mouth Every Am 2)  Triamterene-Hctz 37.5-25 Mg  Tabs (Triamterene-Hctz) .... Take 1 Tablet By Mouth Once A Day 3)  Tramadol Hcl 50 Mg Tabs (Tramadol Hcl) .Marland Kitchen.. 1 By Mouth Three Times A Day As Needed For Pain 4)  Tylenol Extra Strength 500 Mg Tabs (Acetaminophen) .... Take 1-2 By Mouth Once Daily As Needed 5)  Temazepam 30 Mg Caps (Temazepam) .Marland Kitchen.. 1 Cap At Bedtime As Needed 6)  Allegra Allergy 180 Mg Tabs (Fexofenadine Hcl) .... Take 1 By Mouth Once Daily  Allergies: 1)  Codeine Sulfate (Codeine Sulfate) 2)  Celebrex (Celecoxib) 3)  Erythromycin Base (Erythromycin Base) 4)  * Gold Shots 5)  * Pain Medications  Past History:  Past Medical History: Last updated: 11/22/2008 Left ventricular hypertrophy Chronic venous insufficiency Hypertriglyceridemia Hypertension Impaired fasting glucose Osteoarthritis---------------------------------------------Dr Rendall Colonic  polyps Gastric ulcer from NSAID Vitamin B12 deficiency Allergic rhinitis Sleep disorder  Social History: Last updated: 05/20/2007 Retired  crossing Energy manager daughters Former Smoker--quit around 1995 Alcohol use-yes---rare Enjoys arts and crafts  Review of Systems       per HPI  Physical Exam  General:  alert Head:  Normocephalic and atraumatic without obvious abnormalities.  Eyes:  No corneal or conjunctival inflammation noted. EOMI. Perrla.  Ears:  TMs clear bilaterally Nose:  nares mildly congested Mouth:  MMM, no pharyngeal erythema/edema Neck:  supple, no masses, and no cervical lymphadenopathy.   Lungs:  normal respiratory effort, no intercostal retractions, no accessory muscle use, and normal breath sounds.   Heart:  normal rate, regular rhythm, no murmur, and no gallop.   Pulses:  2+ rad pulses, brisk cap refill   Impression & Recommendations:  Problem # 1:  ACUTE BRONCHITIS (ICD-466.0) Assessment New Take antibiotics.  Encouraged to push clear liquids, get enough rest, and take acetaminophen as needed.  To be seen in 5-7 days if no improvement, sooner if worse.  advised not to self medicate with left over meds such as steroids.  given recent steroids and duration of illness, treat with zpack.  Her updated medication list for this problem includes:    Zithromax Z-pak 250 Mg Tabs (Azithromycin) ..... Use as directed  Complete Medication List: 1)  Klor-con M20 20 Meq Tbcr (Potassium chloride crys cr) .... Take 2 by mouth  every am 2)  Triamterene-hctz 37.5-25 Mg Tabs (Triamterene-hctz) .... Take 1 tablet by mouth once a day 3)  Tramadol Hcl 50 Mg Tabs (Tramadol hcl) .Marland Kitchen.. 1 by mouth three times a day as needed for pain 4)  Tylenol Extra Strength 500 Mg Tabs (Acetaminophen) .... Take 1-2 by mouth once daily as needed 5)  Temazepam 30 Mg Caps (Temazepam) .Marland Kitchen.. 1 cap at bedtime as needed 6)  Allegra Allergy 180 Mg Tabs (Fexofenadine hcl) .... Take 1 by mouth once  daily 7)  Zithromax Z-pak 250 Mg Tabs (Azithromycin) .... Use as directed  Patient Instructions: 1)  Sounds like bronchitis. 2)  Treat with zpack as well as push fluids and plenty of rest. 3)  continue delsym for cough. 4)  return if fevers >101.5, worsening cough, shortness of breath. 5)  Good to see you todya, call clinci with quesitons. Prescriptions: ZITHROMAX Z-PAK 250 MG TABS (AZITHROMYCIN) use as directed  #1 x 0   Entered and Authorized by:   Eustaquio Boyden  MD   Signed by:   Eustaquio Boyden  MD on 07/16/2010   Method used:   [facility] to        [facility] [id number]       [address]       [place], [place]  [address]       Ph: [phone] or [phone]       Fax: [phone]   RxID:   [id number]    Orders Added: 1)  Est. Patient Level III [38756]    Current Allergies (reviewed today): CODEINE SULFATE (CODEINE SULFATE) CELEBREX (CELECOXIB) ERYTHROMYCIN BASE (ERYTHROMYCIN BASE) * GOLD SHOTS * PAIN MEDICATIONS  Appended Document: ??bronchitis/alc pt states has taken zpack prior without adverse event despite allergy to erythromycin  Appended Document: Orders Update    Clinical Lists Changes  Orders: Added new Service order of Prescription Created Electronically [phone]) - Signed      Appended Document: ??bronchitis/alc correction to [facility]: [facility]

## 2010-09-26 ENCOUNTER — Other Ambulatory Visit: Payer: Self-pay | Admitting: Internal Medicine

## 2010-10-07 LAB — URINE CULTURE: Colony Count: >=100000

## 2010-10-07 LAB — BASIC METABOLIC PANEL
BUN: 13 mg/dL (ref 6–23)
CO2: 27 mEq/L (ref 19–32)
CO2: 28 mEq/L (ref 19–32)
Calcium: 7.1 mg/dL — ABNORMAL LOW (ref 8.4–10.5)
Calcium: 7.6 mg/dL — ABNORMAL LOW (ref 8.4–10.5)
Calcium: 8.8 mg/dL (ref 8.4–10.5)
Chloride: 102 mEq/L (ref 96–112)
Creatinine, Ser: 0.65 mg/dL (ref 0.4–1.2)
Creatinine, Ser: 0.88 mg/dL (ref 0.4–1.2)
GFR calc Af Amer: >60 mL/min (ref >60)
GFR calc Af Amer: >60 mL/min (ref >60)
GFR calc non Af Amer: >60 mL/min (ref >60)
GFR calc non Af Amer: >60 mL/min (ref >60)
GFR calc non Af Amer: >60 mL/min (ref >60)
Glucose, Bld: 108 mg/dL — ABNORMAL HIGH (ref 70–99)
Glucose, Bld: 97 mg/dL (ref 70–99)
Potassium: 3.8 mEq/L (ref 3.5–5.1)
Sodium: 135 mEq/L (ref 135–145)
Sodium: 136 mEq/L (ref 135–145)
Sodium: 139 mEq/L (ref 135–145)

## 2010-10-07 LAB — TYPE AND SCREEN
ABO/RH(D): B POS
Antibody Screen: NEGATIVE

## 2010-10-07 LAB — POCT I-STAT 4, (NA,K, GLUC, HGB,HCT)
HCT: 28 % — ABNORMAL LOW (ref 36.0–46.0)
Sodium: 140 mEq/L (ref 135–145)

## 2010-10-07 LAB — CBC
HCT: 24.6 % — ABNORMAL LOW (ref 36.0–46.0)
HCT: 25.1 % — ABNORMAL LOW (ref 36.0–46.0)
HCT: 25.2 % — ABNORMAL LOW (ref 36.0–46.0)
HCT: 26.7 % — ABNORMAL LOW (ref 36.0–46.0)
HCT: 35.3 % — ABNORMAL LOW (ref 36.0–46.0)
Hemoglobin: 12.2 g/dL (ref 12.0–15.0)
Hemoglobin: 13.6 g/dL (ref 12.0–15.0)
Hemoglobin: 8.4 g/dL — ABNORMAL LOW (ref 12.0–15.0)
Hemoglobin: 8.5 g/dL — ABNORMAL LOW (ref 12.0–15.0)
Hemoglobin: 8.6 g/dL — ABNORMAL LOW (ref 12.0–15.0)
Hemoglobin: 9.1 g/dL — ABNORMAL LOW (ref 12.0–15.0)
MCHC: 34 g/dL (ref 30.0–36.0)
MCHC: 34.3 g/dL (ref 30.0–36.0)
MCHC: 34.4 g/dL (ref 30.0–36.0)
MCHC: 34.5 g/dL (ref 30.0–36.0)
MCV: 93.1 fL (ref 78.0–100.0)
MCV: 93.6 fL (ref 78.0–100.0)
Platelets: 167 10*3/uL (ref 150–400)
Platelets: 192 10*3/uL (ref 150–400)
Platelets: 207 10*3/uL (ref 150–400)
Platelets: 214 10*3/uL (ref 150–400)
RBC: 3.81 MIL/uL — ABNORMAL LOW (ref 3.87–5.11)
RBC: 4.27 MIL/uL (ref 3.87–5.11)
RDW: 14.1 % (ref 11.5–15.5)
RDW: 14.2 % (ref 11.5–15.5)
RDW: 14.3 % (ref 11.5–15.5)
RDW: 14.3 % (ref 11.5–15.5)
WBC: 10.7 10*3/uL — ABNORMAL HIGH (ref 4.0–10.5)
WBC: 14 10*3/uL — ABNORMAL HIGH (ref 4.0–10.5)
WBC: 16 10*3/uL — ABNORMAL HIGH (ref 4.0–10.5)

## 2010-10-07 LAB — GLUCOSE, CAPILLARY: Glucose-Capillary: 107 mg/dL — ABNORMAL HIGH (ref 70–99)

## 2010-10-07 LAB — COMPREHENSIVE METABOLIC PANEL
ALT: 24 U/L (ref 0–35)
ALT: 37 U/L — ABNORMAL HIGH (ref 0–35)
Albumin: 2.1 g/dL — ABNORMAL LOW (ref 3.5–5.2)
Alkaline Phosphatase: 65 U/L (ref 39–117)
Alkaline Phosphatase: 90 U/L (ref 39–117)
BUN: 13 mg/dL (ref 6–23)
BUN: 18 mg/dL (ref 6–23)
CO2: 27 mEq/L (ref 19–32)
Calcium: 7 mg/dL — ABNORMAL LOW (ref 8.4–10.5)
Calcium: 8.5 mg/dL (ref 8.4–10.5)
Chloride: 101 mEq/L (ref 96–112)
GFR calc non Af Amer: >60 mL/min (ref >60)
Glucose, Bld: 119 mg/dL — ABNORMAL HIGH (ref 70–99)
Glucose, Bld: 130 mg/dL — ABNORMAL HIGH (ref 70–99)
Glucose, Bld: 141 mg/dL — ABNORMAL HIGH (ref 70–99)
Potassium: 3.4 mEq/L — ABNORMAL LOW (ref 3.5–5.1)
Sodium: 132 mEq/L — ABNORMAL LOW (ref 135–145)
Sodium: 140 mEq/L (ref 135–145)
Total Bilirubin: 0.8 mg/dL (ref 0.3–1.2)
Total Protein: 4.2 g/dL — ABNORMAL LOW (ref 6.0–8.3)
Total Protein: 4.6 g/dL — ABNORMAL LOW (ref 6.0–8.3)

## 2010-10-07 LAB — PROTIME-INR
INR: 1.03 (ref 0.00–1.49)
INR: 2.04 — ABNORMAL HIGH (ref 0.00–1.49)
Prothrombin Time: 13.4 seconds (ref 11.6–15.2)
Prothrombin Time: 20.9 seconds — ABNORMAL HIGH (ref 11.6–15.2)
Prothrombin Time: 22.9 seconds — ABNORMAL HIGH (ref 11.6–15.2)

## 2010-10-07 LAB — APTT: aPTT: 26 seconds (ref 24–37)

## 2010-10-07 LAB — VITAMIN D 1,25 DIHYDROXY
Vitamin D 1, 25 (OH)2 Total: 57 pg/mL (ref 18–72)
Vitamin D3 1, 25 (OH)2: 16 pg/mL

## 2010-10-07 LAB — URINALYSIS, ROUTINE W REFLEX MICROSCOPIC
Bilirubin Urine: NEGATIVE
Glucose, UA: NEGATIVE mg/dL
Hgb urine dipstick: NEGATIVE
Nitrite: NEGATIVE
Specific Gravity, Urine: 1.024 (ref 1.005–1.030)
pH: 5 (ref 5.0–8.0)

## 2010-10-07 LAB — IRON AND TIBC
Iron: 25 ug/dL — ABNORMAL LOW (ref 42–135)
Saturation Ratios: 13 % — ABNORMAL LOW (ref 20–55)
TIBC: 194 ug/dL — ABNORMAL LOW (ref 250–470)
UIBC: 169 ug/dL

## 2010-10-07 LAB — FERRITIN: Ferritin: 134 ng/mL (ref 10–291)

## 2010-10-07 LAB — ABO/RH: ABO/RH(D): B POS

## 2010-10-07 LAB — FOLATE: Folate: 4 ng/mL

## 2010-10-07 LAB — RETICULOCYTES: RBC.: 2.85 MIL/uL — ABNORMAL LOW (ref 3.87–5.11)

## 2010-10-28 ENCOUNTER — Telehealth: Payer: Self-pay | Admitting: *Deleted

## 2010-10-28 NOTE — Telephone Encounter (Signed)
Pt has been taking her husbands temazepam 1 mg and now he is out.  Insurance will not pay for an early refill. Pt is asking if she can get her own script [facility], wants a 90 day supply.  She takes one at bedtime.

## 2010-10-29 NOTE — Telephone Encounter (Signed)
I have her listed on the 30mg  temazepam and him on the 15. Has she been taking one of his, in addition to hers??? She is already on the maximum dose and should not take more than 30  If she is out, and does okay with just her husband's 15mg , okay to change hers to 15mg ,   1-2 at bedtime prn to help sleep #180 x 0

## 2010-10-30 MED ORDER — TEMAZEPAM 15 MG PO CAPS: ORAL_CAPSULE | ORAL | Status: DC

## 2010-10-30 NOTE — Telephone Encounter (Signed)
rx was called into the pharmacy, spoke with patient and advised results.

## 2010-11-18 NOTE — Discharge Summary (Signed)
NAMEANALISSE, RANDLE               ACCOUNT NO.:  [id number]   MEDICAL RECORD NO.:  [id number]          PATIENT TYPE:  INP   LOCATION:  1520                         FACILITY:  [facility]   PHYSICIAN:  John L. Rendall, M.D.  DATE OF BIRTH:  02/25/1935   DATE OF ADMISSION:  09/22/2007  DATE OF DISCHARGE:  09/27/2007                               DISCHARGE SUMMARY   ADMISSION DIAGNOSES:  1. End-stage osteoarthritis left knee.  2. History of urinary incontinence.  3. Obesity.  4. Peripheral edema.   DISCHARGE DIAGNOSES:  1. End-stage osteoarthritis left knee status post left total knee      arthroplasty.  2. Acute blood loss anemia secondary to surgery.  3. Hypokalemia now resolved.  4. Left calf pain with negative Doppler.  5. Constipation now resolved.  6. Slow physical therapy progress.  7. History of urinary incontinence.  8. Obesity.  9. Peripheral edema.   SURGICAL PROCEDURES:  On September 22, 2007, Ms. Fortner underwent a left  total knee arthroplasty with computer navigation by Dr. Jonny Ruiz L.  Rendall  assisted by Arnoldo Morale, PA-C.  She had an LCS complete primary femoral  component cemented size standard left with an LCS complete RP insert  size standard 12.5 mm thickness.  A DePuy NBT keel tibial tray cemented  size 3 and LCS complete metal back patella size standard.   COMPLICATIONS:  None.   CONSULTANTS:  1. Physical therapy consult September 23, 2007.  2. Occupational therapy consult September 24, 2007.  3. Case management consult September 26, 2007.   HISTORY OF PRESENT ILLNESS:  This 76 year old white female patient  presented Dr. Priscille Kluver with a history of a painful left knee for quite a  while.  The pain has been a severe constant aching and stabbing with  swelling.  It has gotten worse and bothers her at night.  She has failed  conservative treatment and x-rays show end-stage arthritic changes of  the knee.  Because of this, she is presenting for a left knee  replacement.   HOSPITAL COURSE:  Ms. Rhodus tolerated her surgical procedure well  without immediate postoperative complications.  She was transferred to  the orthopedic floor.  Postop day #1, T-max was 97.7, vitals were  stable.  Hemoglobin 11, hematocrit 32.5, white count was elevated at  15.9.  Leg was neurovascular intact.  Pain was fairly well controlled  although she was having calf pain that was checked with a Doppler which  was negative for DVT.  Her potassium was low that was supplemented and  she was started on therapy per protocol.   Postop day #2, T-max 99.7, vitals stable.  Hemoglobin 10.6, hematocrit  31.1, white count 15.9.  She was switched to p.o. pain medications,  continued on therapy.   Over the next several days, she did make slow progress with therapy.  She remained afebrile, vitals were stable.  Hemoglobin/hematocrit  remained stable.  She had some difficulty with constipation that was  treated effectively with a laxative, but her white count has stayed a  little bit elevated at 11.9.   On March  24, we feel she is ready for transfer to skilled facility for  more rehab prior to going home.  She remains with a T-max of 99.7,  vitals stable.  Hemoglobin 9.3, hematocrit 26.9, white count 11.9.  Incisions well-approximated with staples, mild erythema, and she will be  started on Keflex due to the erythema and small amount of serous  drainage.  She is ready for transfer to the skilled facility later  today.   MEDICATIONS:  1. Lyrica 50 mg p.o. b.i.d. to be continued for one more week and then      discontinued.  2. Colace 100 mg p.o. b.i.d..  3. Senokot one tablet p.o. b.i.d. a.c..  4. Arixtra 2.5 mg subcu q. 8 p.m. with a last dose to be on September 28, 2007.  On March 26 she is to start one baby aspirin 81 mg p.o.      q.a.m..  5. Tenormin 25 mg p.o. q.a.m..  6. Maxzide 37.5/25 mg one tablet p.o. q.a.m.Marland Kitchen  7. K-Dur 20 mEq p.o. b.i.d. which should be continued for one more  day      and then discontinued.  8. Reglan 10 mg p.o. q.8 h p.r.n. for nausea.  9. Dilaudid 2 mg tabs 1-2 tablets p.o. q.4 h p.r.n. for pain.  10.Robaxin 500 mg 1-2 tablets p.o. q.6 h p.r.n. spasms.  11.Keflex 500 mg one tablet p.o. q.i.d. for a total of 5 days started      on March 24.   DISCHARGE INSTRUCTIONS:  1. Diet:  She can resume her regular diet.  2. Activity:  She can be out of bed weightbearing as tolerated on the      left leg with the use a walker.  She is to have home CPM 0-90      degrees 6-8 hours a day for a total of 2 weeks after surgery.  She      is to have home health PT per total knee rehab protocol.  3. Wound care:  Please clean the incision with Betadine once a day and      apply dry dressing.  Notify Dr. Priscille Kluver if temperature greater than      101.5, chills, pain unrelieved by pain medications or foul-smelling      drainage from the wound.  4. Follow-up:  She needs to follow up with Dr. Priscille Kluver in our office      on Tuesday October 04, 2007, and needs to call [phone] for that      appointment.   LABORATORY DATA:  On March 11, white count was 7.3, hemoglobin 12.5,  hematocrit 36.7 and platelets 289.  Went to  a hemoglobin low of 9.3,  hematocrit 26.9.  White count went to a high of 15.9 on March 20, and a  low of 11.9 on March 24.   Potassium dropped to a low of 3.4 on March 20.  It was then within  normal limits.  Glucose ranged from 110 on March 24 to a high of 155 on  March 11.  Albumin was low at 3.2 on March 11, total protein was low at  5.9.  These studies were not repeated during her hospitalization.   Calcium ranged from 8.4 on March 20 to 8.2 on March 22.  Urine culture  done on March 11 showed greater than 100,000 colonies per mil of  multiple bacterial and morphotypes.  Repeat urine culture showed no  growth for final report.  UA  done on March 19 showed negative nitrate,  small leukocyte esterase, 3-6 white cells, 0-2 red cells, rare   epithelials.  All other laboratory studies were within normal limits.      Legrand Pitts Duffy, P.A.      John L. Rendall, M.D.  Electronically Signed    KED/MEDQ  D:  09/27/2007  T:  09/27/2007  Job:  [id number]   cc:   [name], MD  [address] [place], [place] [address]

## 2010-11-18 NOTE — Op Note (Signed)
NAMEALITHEA, Chelsea Keller               ACCOUNT NO.:  [id number]   MEDICAL RECORD NO.:  [id number]          PATIENT TYPE:  INP   LOCATION:  1620                         FACILITY:  [facility]   PHYSICIAN:  John L. Rendall, M.D.  DATE OF BIRTH:  December 18, 1934   DATE OF PROCEDURE:  09/22/2007  DATE OF DISCHARGE:                               OPERATIVE REPORT   PREOPERATIVE DIAGNOSIS:  Osteoarthritis left knee.   POSTOPERATIVE DIAGNOSIS:  Osteoarthritis left knee.   SURGICAL PROCEDURES:  Left LCS total knee replacement with computer  navigation assistance.   SURGEON:  John L. Rendall, M.D.   ASSISTANT:  Legrand Pitts. Duffy, P.A.C.   ANESTHESIA:  General with femoral nerve block.   PATHOLOGY:  The patient has bare bone on the medial femoral condyle and  the undersurface of the patella with spurring on both sides of the  femoral condyle, medial and lateral.  She has tried all conservative  measures without success and has chronic pain.   PROCEDURE:  Under general anesthesia, the left leg was prepared with  DuraPrep and draped as a sterile field.  A proximal thigh sterile  tourniquet is used.  Leg is wrapped out with the Esmarch and the  tourniquet is used at 350 mm.  Midline incision is made.  The patella is  everted.  The knee is exposed.  A large ganglion is present at the  anterior aspect of the knee joint, right in front of the inner condylar  notch.  All of this is debrided in preparation for computer mapping.  Once debridement is done Schanz pins are placed in the superior medial  tibia through accessory punctures and in the inferior medial femur.  The  arrays are set up.  The femoral head is identified.  The medial and  lateral malleoli are identified.  Tibial mapping and then femoral  mapping are done and verified within 0.5 mm of reproducible accuracy.  Once this is completed, it is noted that the knee is 8 degrees of  flexion and 4.5 degree of varus at the start of the procedure.  Once  this is done, the computer is used to assist in planning the procedure.  A proximal tibial resection is carried out and verified within 1 degree  of anatomic accuracy.  The ligament balancing is then done and once that  is completed, anterior and posterior flare of the distal femur are  resected, again within 1 degree of accuracy.  The distal femoral cut is  then made and flexion and extension gaps are matched at 10 mm.  Debridement of the posterior recesses is then carried out and remnants  of the menisci and cruciates are removed as well as spurs off the back  of the condyle.  Once this is completed, the recessing guide is used.  The proximal tibia then was exposed with McHale and Homan retractors.  The tibia was sized at a 3 whereas the femur was a standard.  Once this  center hole with fins has been inserted on a trial basis, the 10 bearing  is inserted and the standard femur.  There is slight hyperextension to  the knee, but excellent alignment.  A 12.5 bearing gets rid of  hyperextension and the knee now is within 1 degree of anatomic alignment  and extends fully.  At this point, the patella is osteotomized and three  peg holes placed, the arrays are taken down.  Permanent components are  obtained and pressure spraying of the cortical surfaces is done.  Once  this is completed, the components are cemented in place.  Cement excess  is removed and the tourniquet is let down at an hour and 11 minutes when  the cement hardened.  The knee is then  flexed.  Excess cement about the joint is removed.  Minor debridement of  the joint is carried out.  Tourniquet let down did not stimulate a lot  of bleeding, but several vessels are cauterized.  Medium Hemovac drain  is then inserted and the knee is closed in layers with #1 Tycron, #1  Vicryl, 2-0 Vicryl and skin clips.      John L. Rendall, M.D.  Electronically Signed     JLR/MEDQ  D:  09/22/2007  T:  09/22/2007  Job:  [id number]

## 2010-11-18 NOTE — Assessment & Plan Note (Signed)
[facility]                            CARDIOLOGY OFFICE NOTE   TAYLORE, HINDE                        MRN:          [id number]  DATE:08/12/2007                            DOB:          30-Jul-1934    PRIMARY CARE PHYSICIAN:  Dr. Tillman Abide   REASON FOR VISIT:  Preoperative evaluation.   HISTORY OF PRESENT ILLNESS:  Ms. Sultan comes in for a preoperative  evaluation prior to planned left total knee replacement.  This will be  under general anesthesia and has not yet been scheduled.  I saw Ms.  Cryan back in March of last year also for a preoperative assessment  prior to a planned hysterectomy.  She has no history of obstructive  coronary artery disease although risk factors including hypertension,  previous tobacco use, and obesity.  I referred her for a Myoview which  was performed in April 2008 and was normal, demonstrating no ischemia  and an ejection fraction of 78%.  At this time Ms. Ledwith denies any  significant exertional chest pain.  She has stable NYHA class II dyspnea  on exertion.  Her electrocardiogram shows sinus rhythm with a single  premature ventricular complex, leftward axis, and nonspecific T-wave  changes.  No major changes noted in comparison to the previous tracing  from March of last year.   ALLERGIES:  CODEINE.   PRESENT MEDICATIONS:  1. Potassium 20 mg p.o. daily.  2. Triamterene/hydrochlorothiazide 37.5/25 mg p.o. daily.  3. Skelaxin 500 mg p.o. b.i.d. to t.i.d.  4. Tylenol p.r.n.   REVIEW OF SYSTEMS:  Those described in the history of present illness.  No palpitations or syncope.  No orthopnea.  She is limited by knee and  leg discomfort.   EXAMINATION:  Blood pressure 152/86, heart rate is 80, weight is 259  pounds which is down from 267 last year.  This is an obese woman in no  acute distress.  HEENT:  Conjunctivae and lids normal.  Oropharynx clear.  NECK:  Supple.  No elevated jugular venous  pressure, no loud bruits, no  thyromegaly is noted.  LUNGS:  Clear without labored breathing.  CARDIAC EXAM:  Reveals a regular rate and rhythm, somewhat distant heart  sounds.  No pericardial rub.  No S3 gallop or loud murmur.  ABDOMEN:  Obese.  Unable to palpate liver edge.  Bowel sounds present.  EXTREMITIES:  Exhibit trace edema.  No significant pitting edema.  Distal pulses 1+.  MUSCULOSKELETAL:  No kyphosis noted.  NEUROPSYCHIATRIC:  The patient alert and oriented x3.  Affect is  appropriate.   IMPRESSION AND RECOMMENDATIONS:  Preoperative assessment in a 76-year-  old woman with history of obesity, hypertension and no obvious  obstructive coronary artery disease based on available information.  She  had a reassuring Myoview in April 2008 demonstrating no ischemia and an  ejection fraction of 78%.  She is not reporting any angina or  progressive breathlessness.  I expect that she should be able to proceed  with planned knee replacement with an acceptable perioperative cardiac  risk.  No other additional cardiac  testing is planned at this time.  I  would like to have her on a perioperative beta blocker for general risk  reduction and will be starting atenolol 25 mg daily to be started  approximately a week before her surgery is planned and continue until  the prescription runs out.  If there are any cardiac issues around the  time of surgery, we can certainly see the patient in consultation as an  inpatient.     Jonelle Sidle, MD  Electronically Signed    SGM/MedQ  DD: 08/12/2007  DT: 08/13/2007  Job #: [id number]   cc:   [name], MD  Carlisle Beers. Rendall, M.D.

## 2010-11-20 ENCOUNTER — Encounter: Payer: Self-pay | Admitting: Internal Medicine

## 2010-11-21 NOTE — Assessment & Plan Note (Signed)
[facility]                            CARDIOLOGY OFFICE NOTE   Chelsea Keller, Chelsea Keller                        MRN:          [id number]  DATE:09/10/2006                            DOB:          [birth year]    PRIMARY CARE PHYSICIAN:  Dr. Tillman Abide.   REASON FOR VISIT:  Preoperative evaluation.   HISTORY OF PRESENT ILLNESS:  Chelsea Keller is a pleasant 75 year old woman  with no previously documented history of obstructive coronary artery  disease or myocardial infarction.  She has actually been evaluated in  the past by a cardiologist, Dr. Erskine Speed in [place], and back  in 2005 underwent both an echocardiogram demonstrating normal left  ventricular systolic function with mild left ventricular hypertrophy and  diastolic function as well as an adenosine nuclear stress test showing  no ischemia with an ejection fraction of 70%.  She was felt to have  fairly atypical and likely noncardiac chest comfort at that time and has  not subsequent evaluation.  Additional history includes hypertension but  no documented type 2 diabetes mellitus or hyperlipidemia.  She is being  considered for a hysterectomy, at this point not clear whether this will  be an abdominal hysterectomy or vaginal hysterectomy.  She has had  problems with menorrhagia.  From the perspective of symptoms she has a  very occasional fleeting, sharp, right-sided chest pain that is very  atypical in description as well as chronic stable NYHA Class II dyspnea  on exertion.  Her electrocardiogram today shows sinus rhythm with a left  rear axis.   ALLERGIES:  CODEINE AND PAIN MEDICATIONS.   PRESENT MEDICINES:  1. Triamterine hydrochlorothiazide 37.5/25 mg p.o. daily.  2. Klor-Con 20 mEq p.o. daily.  3. Lasix 40 mg p.o. daily p.r.n.   PAST MEDICAL HISTORY:  Is as outlined above.  The patient is also status  post left ankle surgery, cholecystectomy, and left wrist surgery.   SOCIAL  HISTORY:  The patient is married (her husband is a patient of  mine).  She has her prior tobacco use history but quit smoking 13 years  ago.  She drinks alcohol rarely.  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   FAMILY HISTORY:  Reviewed and noncontributory for premature  cardiovascular disease.  Her mother died at age 60 with dementia and  heart failure.  Father died age 5 with emphysema.  She has a brother  with colon cancer.   REVIEW OF SYSTEMS:  As described in history of present illness.  She  does have problems with anxiety.  She states that she has arthritic  discomfort and some urinary problems.  She has seasonal allergies.  She  has had no orthopnea or PND.  She states that she is under a substantial  amount of psychosocial stress due to some family problems.   EXAMINATION:  Blood pressure elevated at 170/77 today, although patient  states it is typically much better controlled.  Heart rate 82, weight is  267 pounds, height  is 5 feet 2 inches.  This is an obese woman in no  acute distress without active chest pain.  HEENT:  Conjunctivae, lids normal.  Oropharynx is clear.  NECK:  Supple without elevated jugular venous pressure or loud bruits,  no thyromegaly was noted.  LUNGS:  Clear without labored breathing.  CARDIO:  Reveals a regular rate and rhythm, no loud murmur or S3 gallop.  No pericardial rub.  ABDOMEN:  Obese, liver edge not palpable, bowel sounds present.  EXTREMITIES:  Exhibit trace edema, no marked pitting edema.  Distal  pulses are 1+.  SKIN:  Warm and dry.  MUSCULOSKELETAL:  No kyphosis noted.  NEUROPSYCHIATRIC:  The patient is alert and oriented x3.   IMPRESSION AND RECOMMENDATIONS:  1. Preoperative evaluation in a 76 year old woman with obesity,      hypertension, and prior tobacco use history.  She underwent what      sounds like a reassuring cardiac assessment in 2005, also in the       setting of a preoperative evaluation.  She has not had any followup      testing since that time.  Her electrocardiogram shows sinus rhythm      with a leftward axis.  I discussed this with her today and      recommended a 2-day (due to weight/size) adenosine Myoview for      further risk stratification.  She did not want to proceed with this      testing and refused to have anything done at this point.  I      attempted to explain to her the rationale behind the testing and      the methods involved, but she did not want to proceed at this      point.  I suspect that overall her perioperative cardiac risk would      most likely be relatively low based on her prior evaluations,      although this assessment is limited somewhat by the fact that she      has not had any follow-up ischemic testing in the last three years.      I have asked her to reconsider the matter and let us know.  We can      certainly schedule her testing if she agrees to proceed.  2. Hypertension.  Elevated today.  The patient states she is under a      lot of stress.  I have asked her to follow up with Dr. Alphonsus Sias for      this.     Jonelle Sidle, MD  Electronically Signed    SGM/MedQ  DD: 09/10/2006  DT: 09/10/2006  Job #: [id number]   cc:   Ginger Carne, MD  [name], MD

## 2010-11-21 NOTE — Discharge Summary (Signed)
NAMEFRANZISKA, Chelsea Keller               ACCOUNT NO.:  [id number]   MEDICAL RECORD NO.:  [id number]          PATIENT TYPE:  INP   LOCATION:  9320                          [facility]   PHYSICIAN:  Ginger Carne, MD  DATE OF BIRTH:  12-Jan-1935   DATE OF ADMISSION:  10/19/2006  DATE OF DISCHARGE:  10/22/2006                               DISCHARGE SUMMARY   REASON FOR HOSPITALIZATION:  Postmenopausal bleeding, genuine urinary  stress incontinence.   IN-HOSPITAL PROCEDURES:  Supracervical hysterectomy with preservation of  both tubes and ovaries and terminated Burch colposuspension.   FINAL DIAGNOSES:  1. Postmenopausal bleeding.  2. Genuine urinary stress incontinence.   HOSPITAL COURSE:  This patient is a 76 year old multiparous Caucasian  female with a 10-year history of postmenopausal bleeding which  demonstrated complex nonatypical hyperplasia of the endometrium.  The  patient is morbidly obese, weighing 274 pounds.  The patient has had a  longstanding history with appropriate workup of genuine urinary stress  incontinence.  Approximately one year prior to her surgery, the patient  was seen at which time she was advised that weight-loss would be  appropriate before attempting a Burch colposuspension.  Due to her obese  anatomy, it was inadvisable to perform a tension-free vaginal tape  procedure vaginally.  The patient was told prior to her surgery that an  attempt will be made at a Burch colposuspension; however, the anatomy  will dictate whether this procedure is possible.   The patient underwent aforementioned surgery on October 19, 2006.  Due to  the patient's obesity, a supracervical hysterectomy was safely  performed.  However, despite multiple attempts, a Burch colposuspension  could not be performed.  The patient's space of Retzius was extremely  deep, and it was difficult to safely access the demarcation of the  bladder and the vagina, in addition to concerns related  to bleeding that  may not be checked due to limitation in the ability to visualize said  space.  For this reason, the procedure was not performed.  Postoperatively, she did well.  She was afebrile, voided well.  Her  discharge hemoglobin and hematocrit was 30.5 and 10.2.  A Jackson-Pratt  drain in the subcutaneous tissue had minimal drainage on discharge.  Calves without tenderness.  Lungs clear.  The patient had a mild  productive cough which on chest x-ray revealed a focal alveolar density  in the right lower lobe.  Her white count was normal on discharge.   The patient was discharged with Dilaudid 2 mg one every 4 to 6 hours as  needed for pain, Augmentin 500 mg twice a day for community-acquired  pneumonia and Zofran 8 mg 1 every 4 to 6 hours as needed for nausea and  vomiting.  In addition, the patient will continue her home medications  including triamterene hydrochlorothiazide 37.5/25 one daily, potassium  tablets 1 daily (dose unknown).  She was advised to contact the office  for temperature elevation above 100.4 degrees Fahrenheit, increasing  incisional pain, abdominal pain, vaginal bleeding, genitourinary or  gastrointestinal complaints.  The patient was advised for care related  to her Jackson-Pratt drain.  She will return on April 21 to have said  drain removed and then 1 week later for her staples and retention  sutures to be removed.  All  questions answered to the satisfaction of said patient.  The patient  verbalized understanding of said instructions.  The patient also  verbalized this appointment and inability to perform Burch  colposuspension but was advised that due to her obesity it was unsafe to  pursue this part of the operation.      Ginger Carne, MD  Electronically Signed     SHB/MEDQ  D:  10/22/2006  T:  10/22/2006  Job:  [id number]

## 2010-11-21 NOTE — Op Note (Signed)
Chelsea Keller, Chelsea Keller               ACCOUNT NO.:  [id number]   MEDICAL RECORD NO.:  [id number]          PATIENT TYPE:  AMB   LOCATION:  SDC                           [facility]   PHYSICIAN:  Ginger Carne, MD  DATE OF BIRTH:  1935-05-09   DATE OF PROCEDURE:  10/19/2006  DATE OF DISCHARGE:                               OPERATIVE REPORT   PREOPERATIVE DIAGNOSIS:  Postmenopausal bleeding and genuine urinary  stress incontinence.   POSTOPERATIVE DIAGNOSIS:  Postmenopausal bleeding and genuine urinary  stress incontinence.   PROCEDURE:  Supracervical hysterectomy and aborted Burch colposuspension  secondary to difficult visualization due to obesity.   SURGEON:  Blima Rich, M.D.   ASSISTANTMichele Mcalpine D. Rose, M.D.   ESTIMATED BLOOD LOSS:  600 mL.   COMPLICATIONS:  None immediate.   ANESTHESIA:  General.   SPECIMEN:  Uterus and portion of cervix sent to pathology.   OPERATIVE FINDINGS:  The patient is morbidly obese with significant  central obesity.  A supracervical hysterectomy had to be performed due  to anatomical considerations.  Although the space of Retzius could be  visualized, there was not sufficient room, and the level of safety to  deal with potential bleeding was not present.  Although the space was  narrow and partially visualized, it was not visualized sufficiently to  account for any potential venous or arterial bleeding that would need to  be checked.  For this reason, it was determined to abandon the procedure  than take the risk of possible harm to the patient.   OPERATIVE PROCEDURE:  The patient was prepped and draped in usual  fashion and placed in a supine position.  Betadine solution was used for  antiseptic, and the patient was catheterized prior to procedure.  After  adequate general anesthesia, a vertical infraumbilical incision was made  and the abdomen opened.  Self-retaining retractors using a Bookwalter  system was utilized.  The ureters  were identified bilaterally.  The  round ligaments were clamped and ligated 0 Vicryl suture.  Left and  right infundibulopelvic ligaments were clamped and ligated with 0 Vicryl  suture.  The anterior and posterior peritoneal reflections were  dissected off the uterus using a standard Richardson fashion.  The  uterine vasculature was clamped and ligated with 0 Vicryl suture.  This  extended to the level of the cervix where the uterus was removed.  Due  to anatomical considerations and difficulty with visualization, this was  the safest approach to avoid heavy bleeding.  Following this, bleeding  points hemostatically checked and over closure of the cervix after  cauterizing its interior with a series of figure-of-eight 0 Vicryl  sutures.  Bleeding points hemostatically checked.  Blood clots removed.  An attempt at Burch colposuspension was made, but as described above,  due to safety considerations, the procedure was terminated.  No sutures  were placed on either side of the urethra.  Closure of the abdomen was  with a double loop PDS running suture incorporating the peritoneum and  fascia and then a series of through-and-through skin through  subcutaneous tissue  with 1-inch red Robinson tubing  was utilized to close the skin including skin staples.  A Jackson-Pratt  drain was also utilized for subcutaneous drainage.  The patient  tolerated the procedure well and returned to post-anesthesia recovery  room in excellent condition.      Ginger Carne, MD  Electronically Signed     SHB/MEDQ  D:  10/19/2006  T:  10/19/2006  Job:  [id number]

## 2010-11-21 NOTE — H&P (Signed)
Chelsea Keller, Chelsea Keller               ACCOUNT NO.:  [id number]   MEDICAL RECORD NO.:  [id number]          PATIENT TYPE:  AMB   LOCATION:  SDC                           [facility]   PHYSICIAN:  Chelsea Carne, MD  DATE OF BIRTH:  [birth year]   DATE OF ADMISSION:  DATE OF DISCHARGE:                              HISTORY & PHYSICAL   CHIEF COMPLAINT:  Metrorrhagia/slash postmenopausal bleeding and genuine  urinary stress incontinence.   HISTORY OF PRESENT ILLNESS:  This patient is a 76 year old Caucasian  female, gravida 4, para 4-0-0-4 with a 10-year history of postmenopausal  bleeding and genuine urinary stress incontinence.  She has had 2 or 3  dilation and curettage procedures in [place] as late as 2 years ago  demonstrating benign findings.  An endometrial biopsy performed in  January 2008 demonstrated an endometrial hyperplasia without atypia.  Her intrauterine cavity appeared normal on SIS transvaginal ultrasound  without polyps or without evidence for submucosal fibroids.  The patient  takes no medications to enhance her bleeding propensity and has no  personal family history of bleeding diaphyses.   Her incontinence has been longstanding and worsening over the past 8-10  years.  Her history is negative for symptoms of an overactive bladder,  urgency, frequent urination or postvoid dribbling.  She does not have  nocturia.  She has never had urological or bladder surgery in the past.  She does not have fecal incontinence.  She has no neurological,  musculoskeletal or disease states that would enhance her propensity for  incontinence and takes no medications that would worsen her incontinence  or cause incontinence.   OB-GYN HISTORY:  The patient has had four vaginal deliveries in the  past, the latest when she was at age 55.  She entered menopause about  79.  There is a normal Pap smear with past 12 months.   ALLERGIES:  CODEINE and ERYTHROMYCIN.   CURRENT  MEDICATIONS:  1. Hydrochlorothiazide one 25 mg tablet daily.  2. Potassium supplement.  3. K-Dur and Tylenol as needed.   PAST MEDICAL HISTORY:  1. Hypertension.  2. Obesity.  3. Arthritis.  4. One episode of pneumonia.   PAST SURGICAL HISTORY:  1. In 1978 she had a cholecystectomy.  2. She had pins in her ankle and fractured wrist in 1985 and 1980      respectively.   SOCIAL HISTORY:  The patient is a nonsmoker.  Denies alcohol or illicit  drug abuse.   REVIEW OF SYSTEMS:  His 14-point comprehensive review of systems is  negative.   FAMILY HISTORY:  Her mother has had ovarian cancer and hypertension.  Her father has had a myocardial infarction.   PHYSICAL EXAMINATION:  GENERAL:  This is a pleasant female in no acute  distress.  VITAL SIGNS:  Weight is 274 pounds, 5 feet 2 inches.  Blood pressure  147/85.  Pulse 88 and regular.  HEENT:  Grossly normal.  BREASTS:  Breast exam without masses, discharge, thickenings or  tenderness.  CHEST:  Clear to percussion and auscultation.  CARDIOVASCULAR:  Without murmurs or enlargements.  Regular rate and  rhythm.  EXTREMITIES, LYMPHATICS, SKIN, NEUROLOGICAL AND MUSCULOSKELETAL:  Within  normal limits.  ABDOMEN:  Demonstrates central obesity without gross hepatosplenomegaly.  PELVIC:  Normal Pap smear within the past 12 months.  EXTERNAL GENITALIA:  Vulva and vagina normal.  Cervix smooth without  erosions or lesions.  Uterus is small, anteverted and flexed.  Both  adnexa palpable, found to be normal.  There is no evidence of prolapse,  enterocele, anterior, or posterior vaginal compartment defects.   LABORATORY DATA:  Urinalysis was negative.  Residual urine volume 16 mL.  Positive cough stress test noted.   IMPRESSION:  Is postmenopausal bleeding and genuine urinary stress  incontinence.   PLAN:  The patient was given the option of having an endometrial  ablation technique which she declined.  She does not wish to live with   said bleeding in spite of her benign endometrial biopsy.  Because of the  patient's obesity and particularly long and narrow vagina, it  was  deemed to have unsafe to try and attempt a total vaginal hysterectomy  with bilateral salpingo-oophorectomy.  Nature of the abdominal  hysterectomy discussed in detail including complications.  Risks  including injuries to ureter, bowel and bladder, hemorrhage possibly  requiring blood transfusion, infection postoperatively of the skin, or  cuff cellulitis were discussed with the patient.  She understands  because of her size,  it is a possibility that she may develop a  pulmonary embolism related to same.  Appropriate precautions will be  taken, however.   Patient is a candidate for a Burch colposuspension, the nature of said  procedure discussed in detail.  Risks including possible injury to  bladder, postoperative urinary retention, urgency or failure of said  procedure in the near or far term were discussed and understood by said  patient.  She understands that her weight is an issue insofar as lower  success rate.  However, she is willing to take this risk.  The patient  understands that may be a potential for wound infection in the space of  Retzius and hemorrhage.  Ashby Dawes of said procedure again discussed in  detail.  All questions answered to satisfaction of said patient and the  patient verbalized understanding of same   The patient was cleared by Dr. Nona Dell for surgery.  She had a  normal stress nuclear study on October 07, 2006.      Chelsea Carne, MD  Electronically Signed     SHB/MEDQ  D:  10/17/2006  T:  10/17/2006  Job:  [id number]   cc:   Chelsea Schwalbe, MD  [address] [place], [place] [address]

## 2010-11-24 ENCOUNTER — Encounter: Payer: Self-pay | Admitting: Internal Medicine

## 2010-11-24 ENCOUNTER — Ambulatory Visit (INDEPENDENT_AMBULATORY_CARE_PROVIDER_SITE_OTHER): Payer: Medicare Other | Admitting: Internal Medicine

## 2010-11-24 VITALS — BP 148/76 | HR 88 | Temp 98.3°F | Wt 263.5 lb

## 2010-11-24 DIAGNOSIS — I872 Venous insufficiency (chronic) (peripheral): Secondary | ICD-10-CM

## 2010-11-24 DIAGNOSIS — I10 Essential (primary) hypertension: Secondary | ICD-10-CM

## 2010-11-24 DIAGNOSIS — L408 Other psoriasis: Secondary | ICD-10-CM

## 2010-11-24 DIAGNOSIS — G479 Sleep disorder, unspecified: Secondary | ICD-10-CM

## 2010-11-24 DIAGNOSIS — L409 Psoriasis, unspecified: Secondary | ICD-10-CM

## 2010-11-24 DIAGNOSIS — M1990 Unspecified osteoarthritis, unspecified site: Secondary | ICD-10-CM

## 2010-11-24 LAB — CBC WITH DIFFERENTIAL/PLATELET
Basophils Relative: 0.4 % (ref 0.0–3.0)
Eosinophils Relative: 3.7 % (ref 0.0–5.0)
HCT: 38.2 % (ref 36.0–46.0)
Hemoglobin: 13.1 g/dL (ref 12.0–15.0)
Lymphs Abs: 1.9 10*3/uL (ref 0.7–4.0)
MCV: 91.3 fl (ref 78.0–100.0)
Monocytes Absolute: 0.6 10*3/uL (ref 0.1–1.0)
Monocytes Relative: 6.2 % (ref 3.0–12.0)
Neutro Abs: 6.8 10*3/uL (ref 1.4–7.7)
Platelets: 268 10*3/uL (ref 150.0–400.0)
RBC: 4.18 Mil/uL (ref 3.87–5.11)
WBC: 9.7 10*3/uL (ref 4.5–10.5)

## 2010-11-24 LAB — HEPATIC FUNCTION PANEL
Albumin: 3.4 g/dL — ABNORMAL LOW (ref 3.5–5.2)
Total Bilirubin: 1 mg/dL (ref 0.3–1.2)

## 2010-11-24 LAB — BASIC METABOLIC PANEL
BUN: 18 mg/dL (ref 6–23)
Calcium: 9 mg/dL (ref 8.4–10.5)
Creatinine, Ser: 0.9 mg/dL (ref 0.4–1.2)
GFR: 67.63 mL/min (ref >60.00)

## 2010-11-24 LAB — TSH: TSH: 1.23 u[IU]/mL (ref 0.35–5.50)

## 2010-11-24 MED ORDER — TRIAMCINOLONE ACETONIDE 0.1 % EX CREA: TOPICAL_CREAM | Freq: Two times a day (BID) | CUTANEOUS | Status: DC

## 2010-11-24 NOTE — Progress Notes (Signed)
Subjective:    Patient ID: Chelsea Keller, female    DOB: 1935-02-14, 76 y.o.   MRN: [id number]  HPI Doing okay Ongoing pain in leg and shoulder "I think I am learning to live with it" Ibuprofen helps more than tylenol---400mg --"it gets me through the day" Does her shopping, cooks, housework. Daughter helps with cleaning and dishes due to her hand arthritis  Sleeps okay with the temazepam  Usually only needs 15mg   Psoriasis is "the worst I have had in my life" Has lots of sauves from the past Has tried some alcohol to dry it off  BP has been okay in general Doesn't check --"I can usually tell" No chest pain No SOB  Chronic edema--no worse  Current outpatient prescriptions:acetaminophen (TYLENOL) 500 MG tablet, Take 500-1,000 mg by mouth daily as needed.  , Disp: , Rfl: ;  fexofenadine (ALLEGRA) 180 MG tablet, Take 180 mg by mouth daily as needed. , Disp: , Rfl: ;  Ibuprofen (ADVIL) 200 MG CAPS, Take by mouth. Take 2 by mouth daily as needed , Disp: , Rfl: ;  potassium chloride SA (KLOR-CON M20) 20 MEQ tablet, Take 20 mEq by mouth 2 (two) times daily.  , Disp: , Rfl:  temazepam (RESTORIL) 15 MG capsule, Take 1-2 once daily at bedtime as needed to help sleep, Disp: 180 capsule, Rfl: 0;  traMADol (ULTRAM) 50 MG tablet, Take 50 mg by mouth 3 (three) times daily as needed.  , Disp: , Rfl: ;  triamterene-hydrochlorothiazide (MAXZIDE-25) 37.5-25 MG per tablet, TAKE 1 TABLET BY MOUTH ONCE A DAY, Disp: 90 tablet, Rfl: 3  Past Medical History  Diagnosis Date  . Left ventricular hypertrophy   . Chronic venous insufficiency   . Hypertriglyceridemia   . Hypertension   . Arthritis   . Allergy   . Impaired fasting glucose   . Hx of colonic polyps   . Gastric ulcer   . Vitamin B12 deficiency   . Sleep disorder     Past Surgical History  Procedure Date  . Cholecystectomy   . Tonsillectomy   . External fixation wrist fracture 1985    left with pins  . External fixation ankle fracture       Fx.  left ankle-fixation with pins later removed sec to infection 1985  . Cataract extraction 10/2003    OD  . Abdominal hysterectomy   . Hemiarthroplasty shoulder fracture 06/2009  . Femur fracture surgery 06/2009  . Total knee arthroplasty 03/09    left    Family History  Problem Relation Age of Onset  . Depression Mother   . Cancer Mother     uterine cancer  . Cancer Brother     prostate cancer  . Diabetes Maternal Aunt   . Arthritis Brother   . Asthma Brother     History   Social History  . Marital Status: Married    Spouse Name: N/A    Number of Children: 4  . Years of Education: N/A   Occupational History  . retired crossing guard    Social History Main Topics  . Smoking status: Former Smoker    Quit date: 07/06/1993  . Smokeless tobacco: Never Used  . Alcohol Use: Yes     rare  . Drug Use: No  . Sexually Active: Not on file   Other Topics Concern  . Not on file   Social History Narrative   Enjoys arts and crafts   Review of Systems Appetite is great Weight up a  few pounds Walks in grocery store and [employer]---not much exercise otherwise Plays with dog in yard     Objective:   Physical Exam  Constitutional: She appears well-developed and well-nourished. No distress.  Neck: Normal range of motion. Neck supple. No thyromegaly present.  Cardiovascular: Normal rate, regular rhythm and normal heart sounds.  Exam reveals no gallop.   No murmur heard.      Faint pedal pulses  Pulmonary/Chest: Effort normal and breath sounds normal. No respiratory distress. She has no wheezes. She has no rales.  Musculoskeletal: She exhibits no tenderness.       1-2+ edema to ankles  Lymphadenopathy:    She has no cervical adenopathy.  Neurological:       No focal weakness Trouble getting off table  Skin:       Psoriatic rash on lower right leg  Psychiatric: She has a normal mood and affect. Her behavior is normal. Judgment and thought content normal.           Assessment & Plan:

## 2010-12-16 ENCOUNTER — Other Ambulatory Visit: Payer: Self-pay | Admitting: Internal Medicine

## 2010-12-16 DIAGNOSIS — I10 Essential (primary) hypertension: Secondary | ICD-10-CM

## 2010-12-18 ENCOUNTER — Other Ambulatory Visit (INDEPENDENT_AMBULATORY_CARE_PROVIDER_SITE_OTHER): Payer: Medicare Other

## 2010-12-18 DIAGNOSIS — I10 Essential (primary) hypertension: Secondary | ICD-10-CM

## 2010-12-18 LAB — POTASSIUM: Potassium: 3.9 mEq/L (ref 3.5–5.1)

## 2011-02-13 ENCOUNTER — Other Ambulatory Visit: Payer: Self-pay | Admitting: Internal Medicine

## 2011-03-30 LAB — BASIC METABOLIC PANEL
BUN: 13
BUN: 14
BUN: 15
CO2: 25
CO2: 31
Calcium: 8.3 — ABNORMAL LOW
Calcium: 8.4
Chloride: 102
Chloride: 103
Chloride: 104
Creatinine, Ser: 0.75
Creatinine, Ser: 0.82
Creatinine, Ser: 0.94
GFR calc Af Amer: >60
GFR calc Af Amer: >60
GFR calc Af Amer: >60
GFR calc non Af Amer: 59 — ABNORMAL LOW
GFR calc non Af Amer: >60
Glucose, Bld: 145 — ABNORMAL HIGH
Potassium: 4
Potassium: 4.2

## 2011-03-30 LAB — CBC
HCT: 27 — ABNORMAL LOW
HCT: 31.1 — ABNORMAL LOW
MCHC: 34.2
MCHC: 34.4
MCHC: 34.5
MCV: 88.9
MCV: 88
MCV: 89
MCV: 89.5
Platelets: 289
Platelets: 223
Platelets: 238
Platelets: 245
RBC: 3.04 — ABNORMAL LOW
RBC: 3.47 — ABNORMAL LOW
RDW: 13.8
RDW: 14.2
WBC: 7.3
WBC: 12.7 — ABNORMAL HIGH
WBC: 15.9 — ABNORMAL HIGH
WBC: 15.9 — ABNORMAL HIGH

## 2011-03-30 LAB — URINALYSIS, ROUTINE W REFLEX MICROSCOPIC
Bilirubin Urine: NEGATIVE
Glucose, UA: NEGATIVE
Ketones, ur: NEGATIVE
Nitrite: NEGATIVE
Protein, ur: NEGATIVE
Specific Gravity, Urine: 1.023
Urobilinogen, UA: 1
pH: 5.5

## 2011-03-30 LAB — URINE MICROSCOPIC-ADD ON

## 2011-03-30 LAB — CROSSMATCH

## 2011-03-30 LAB — URINE CULTURE
Colony Count: >=100000
Colony Count: NO GROWTH
Colony Count: NO GROWTH
Culture: NO GROWTH
Culture: NO GROWTH
Special Requests: NEGATIVE
Special Requests: NEGATIVE
Special Requests: POSITIVE

## 2011-03-30 LAB — COMPREHENSIVE METABOLIC PANEL
ALT: 16
Alkaline Phosphatase: 72
CO2: 28
Chloride: 104
GFR calc non Af Amer: >60
Glucose, Bld: 155 — ABNORMAL HIGH
Potassium: 3.5
Sodium: 140

## 2011-03-30 LAB — PROTIME-INR
INR: 1
Prothrombin Time: 13.5

## 2011-03-30 LAB — DIFFERENTIAL
Basophils Relative: 0
Eosinophils Absolute: 0.3
Neutrophils Relative %: 70

## 2011-03-30 LAB — ABO/RH: ABO/RH(D): B POS

## 2011-06-01 ENCOUNTER — Encounter: Payer: Self-pay | Admitting: Internal Medicine

## 2011-06-01 ENCOUNTER — Ambulatory Visit (INDEPENDENT_AMBULATORY_CARE_PROVIDER_SITE_OTHER): Payer: Medicare Other | Admitting: Internal Medicine

## 2011-06-01 DIAGNOSIS — R609 Edema, unspecified: Secondary | ICD-10-CM

## 2011-06-01 DIAGNOSIS — M1990 Unspecified osteoarthritis, unspecified site: Secondary | ICD-10-CM

## 2011-06-01 DIAGNOSIS — I10 Essential (primary) hypertension: Secondary | ICD-10-CM

## 2011-06-01 DIAGNOSIS — F438 Other reactions to severe stress: Secondary | ICD-10-CM

## 2011-06-01 DIAGNOSIS — G479 Sleep disorder, unspecified: Secondary | ICD-10-CM

## 2011-06-01 NOTE — Assessment & Plan Note (Signed)
Stable with ulcers No changes

## 2011-06-01 NOTE — Progress Notes (Signed)
Subjective:    Patient ID: Chelsea Keller, female    DOB: 04/11/1935, 76 y.o.   MRN: [id number]  HPI Doing fair Ongoing right shoulder pain---the plates have been removed Plates remain in left femur Still has trouble getting around---feels it may be her weight Has not been careful with diet----is a regular stress eater  No headaches No chest pain No SOB Still does housework---no dyspnea with typical tasks  Mild edema---nothing striking Better in AM No ulcers---but still has psoriasis Tried cortisone cream--helped briefly Clindamycin cream has helped more (is alternating them)  Ongoing sleep problems Unable to get off the temazepam--tried off and then had a hard time Back to using one each night--but does try to skip nights at times  Current Outpatient Prescriptions on File Prior to Visit  Medication Sig Dispense Refill  . acetaminophen (TYLENOL) 500 MG tablet Take 500-1,000 mg by mouth daily as needed.        . fexofenadine (ALLEGRA) 180 MG tablet Take 180 mg by mouth daily as needed.       . Ibuprofen (ADVIL) 200 MG CAPS Take by mouth. Take 2 by mouth daily as needed       . KLOR-CON M20 20 MEQ tablet TAKE 1 TABLET TWICE DAILY  180 tablet  2  . temazepam (RESTORIL) 15 MG capsule Take 1-2 once daily at bedtime as needed to help sleep  180 capsule  0  . traMADol (ULTRAM) 50 MG tablet Take 100 mg by mouth daily.       Marland Kitchen triamterene-hydrochlorothiazide (MAXZIDE-25) 37.5-25 MG per tablet TAKE 1 TABLET BY MOUTH ONCE A DAY  90 tablet  3    Allergies  Allergen Reactions  . Celecoxib   . Codeine Sulfate   . Erythromycin Base     Past Medical History  Diagnosis Date  . Left ventricular hypertrophy   . Chronic venous insufficiency   . Hypertriglyceridemia   . Hypertension   . Arthritis   . Allergy   . Impaired fasting glucose   . Hx of colonic polyps   . Gastric ulcer   . Vitamin B12 deficiency   . Sleep disorder     Past Surgical History  Procedure Date  .  Cholecystectomy   . Tonsillectomy   . External fixation wrist fracture 1985    left with pins  . External fixation ankle fracture     Fx.  left ankle-fixation with pins later removed sec to infection 1985  . Cataract extraction 10/2003    OD  . Abdominal hysterectomy   . Hemiarthroplasty shoulder fracture 06/2009  . Femur fracture surgery 06/2009  . Total knee arthroplasty 03/09    left    Family History  Problem Relation Age of Onset  . Depression Mother   . Cancer Mother     uterine cancer  . Cancer Brother     prostate cancer  . Diabetes Maternal Aunt   . Arthritis Brother   . Asthma Brother     History   Social History  . Marital Status: Married    Spouse Name: N/A    Number of Children: 4  . Years of Education: N/A   Occupational History  . retired crossing guard    Social History Main Topics  . Smoking status: Former Smoker    Quit date: 07/06/1993  . Smokeless tobacco: Never Used  . Alcohol Use: Yes     rare  . Drug Use: No  . Sexually Active: Not on file  Other Topics Concern  . Not on file   Social History Narrative   Enjoys arts and crafts    Review of Systems Weight is up 6#--discussed Not depressed    Objective:   Physical Exam  Constitutional: Chelsea Keller appears well-developed. No distress.  Neck: Normal range of motion. Neck supple. No thyromegaly present.  Cardiovascular: Normal rate, regular rhythm and normal heart sounds.  Exam reveals no gallop.   No murmur heard. Pulmonary/Chest: Effort normal and breath sounds normal. No respiratory distress. Chelsea Keller has no wheezes.  Musculoskeletal: Chelsea Keller exhibits edema.       2+ tense edema  Lymphadenopathy:    Chelsea Keller has no cervical adenopathy.  Skin: Rash noted.       No ulcers Classic psoriatic rash on calves (and slight on scalp)  Psychiatric: Chelsea Keller has a normal mood and affect. Her behavior is normal. Judgment and thought content normal.          Assessment & Plan:

## 2011-06-01 NOTE — Assessment & Plan Note (Signed)
Still needs the temazepam regularly Discussed trying to skip days regularly

## 2011-06-01 NOTE — Assessment & Plan Note (Signed)
BP Readings from Last 3 Encounters:  06/01/11 156/74  11/24/10 148/76  07/16/10 134/62   Not optimal control but has been okay No changes for now  Lab Results  Component Value Date   CREATININE 0.9 11/24/2010

## 2011-06-01 NOTE — Assessment & Plan Note (Signed)
Ongoing issues Now worried about husband's aortic aneurysm which may not be able to be fixed Continues to stress eat---no easy answer

## 2011-06-01 NOTE — Assessment & Plan Note (Signed)
Uses the tramadol prn Ongoing pain in multiple sites

## 2011-06-04 ENCOUNTER — Ambulatory Visit (INDEPENDENT_AMBULATORY_CARE_PROVIDER_SITE_OTHER): Payer: Medicare Other | Admitting: Cardiovascular Disease

## 2011-06-04 ENCOUNTER — Other Ambulatory Visit: Payer: Self-pay

## 2011-06-04 ENCOUNTER — Encounter: Payer: Self-pay | Admitting: Cardiovascular Disease

## 2011-06-04 VITALS — BP 145/77 | HR 80 | Ht 62.0 in | Wt 269.0 lb

## 2011-06-04 DIAGNOSIS — R609 Edema, unspecified: Secondary | ICD-10-CM

## 2011-06-04 DIAGNOSIS — I10 Essential (primary) hypertension: Secondary | ICD-10-CM

## 2011-06-04 NOTE — Assessment & Plan Note (Signed)
Stable. She will use Lasix prn. Her LV function is known to be normal .

## 2011-06-04 NOTE — Assessment & Plan Note (Signed)
BP is elevated today. She does not want to add a new medication. She will follow her BP at home and let us know if it remains elevated.

## 2011-06-04 NOTE — Progress Notes (Signed)
History of Present Illness: 76 yo WF with history of obesity, HTN and chronic lower extremity edema here today for a routine visit.   She has a remote history of tobacco abuse. She has had a normal myoview stress test in April of 2008. I saw her last in October 2011. She has had prior complaints of lower ext edema. Her echo in May 2010 showed normal LV function and no significant valvular issues.   She is here today for follow up. She has chronic lower extremity edema but this is actually improved. She has not needed to use her Lasix. She has had no chest pain, SOB or palpitations.  Past Medical History  Diagnosis Date  . Left ventricular hypertrophy   . Chronic venous insufficiency   . Hypertriglyceridemia   . Hypertension   . Arthritis   . Allergy   . Impaired fasting glucose   . Hx of colonic polyps   . Gastric ulcer   . Vitamin B12 deficiency   . Sleep disorder     Past Surgical History  Procedure Date  . Cholecystectomy   . Tonsillectomy   . External fixation wrist fracture 1985    left with pins  . External fixation ankle fracture     Fx.  left ankle-fixation with pins later removed sec to infection 1985  . Cataract extraction 10/2003    OD  . Abdominal hysterectomy   . Hemiarthroplasty shoulder fracture 06/2009  . Femur fracture surgery 06/2009  . Total knee arthroplasty 03/09    left    Current Outpatient Prescriptions  Medication Sig Dispense Refill  . acetaminophen (TYLENOL) 500 MG tablet Take 500-1,000 mg by mouth daily as needed.        . clindamycin (CLEOCIN T) 1 % external solution Apply 1 application topically 2 (two) times daily.        . fexofenadine (ALLEGRA) 180 MG tablet Take 180 mg by mouth daily as needed.       . Ibuprofen (ADVIL) 200 MG CAPS Take by mouth. Take 2 by mouth daily as needed       . potassium chloride SA (KLOR-CON M20) 20 MEQ tablet        . temazepam (RESTORIL) 15 MG capsule Take 1-2 once daily at bedtime as needed to help sleep   180 capsule  0  . traMADol (ULTRAM) 50 MG tablet Take 100 mg by mouth daily.       Marland Kitchen triamterene-hydrochlorothiazide (MAXZIDE-25) 37.5-25 MG per tablet TAKE 1 TABLET BY MOUTH ONCE A DAY  90 tablet  3    Allergies  Allergen Reactions  . Celecoxib   . Codeine Sulfate   . Erythromycin Base     History   Social History  . Marital Status: Married    Spouse Name: N/A    Number of Children: 4  . Years of Education: N/A   Occupational History  . retired crossing guard    Social History Main Topics  . Smoking status: Former Smoker    Quit date: 07/06/1993  . Smokeless tobacco: Never Used  . Alcohol Use: Yes     rare  . Drug Use: No  . Sexually Active: Not on file   Other Topics Concern  . Not on file   Social History Narrative   Enjoys arts and crafts    Family History  Problem Relation Age of Onset  . Depression Mother   . Cancer Mother     uterine cancer  . Cancer  Brother     prostate cancer  . Diabetes Maternal Aunt   . Arthritis Brother   . Asthma Brother     Review of Systems:  As stated in the HPI and otherwise negative.   BP 145/77  Pulse 80  Ht 5\' 2"  (1.575 m)  Wt 269 lb (122.018 kg)  BMI 49.20 kg/m2  Physical Examination: General: Well developed, well nourished, NAD HEENT: OP clear, mucus membranes moist SKIN: warm, dry. No rashes. Neuro: No focal deficits Musculoskeletal: Muscle strength 5/5 all ext Psychiatric: Mood and affect normal Neck: No JVD, no carotid bruits, no thyromegaly, no lymphadenopathy. Lungs:Clear bilaterally, no wheezes, rhonci, crackles Cardiovascular: Regular rate and rhythm. No murmurs, gallops or rubs. Abdomen:Soft. Bowel sounds present. Non-tender.  Extremities: Trace bilateral lower extremity edema. Pulses are 2 + in the bilateral DP/PT.  EKG:NSR, rate 73 bpm. LAD. Low voltage.

## 2011-06-04 NOTE — Patient Instructions (Signed)
Your physician wants you to follow-up in: 12 months.  You will receive a reminder letter in the mail two months in advance. If you don't receive a letter, please call our office to schedule the follow-up appointment.  Your physician recommends that you continue on your current medications as directed. Please refer to the Current Medication list given to you today.   

## 2011-06-04 NOTE — Telephone Encounter (Signed)
Pt said she was seen by Dr Alphonsus Sias on 06/01/11 and thought Tramadol and Clindamycin external solution was going to be refilled at [facility]. Med was not sent in and pt request refill. Pt can be reached at [phone]. Pt said Friday would be OK for refill.

## 2011-06-05 ENCOUNTER — Telehealth: Payer: Self-pay | Admitting: Internal Medicine

## 2011-06-05 MED ORDER — CLINDAMYCIN PHOSPHATE 1 % EX SOLN: 1.0000 "application " | Freq: Two times a day (BID) | CUTANEOUS | Status: DC

## 2011-06-05 MED ORDER — TRAMADOL HCL 50 MG PO TABS: 50.0000 mg | ORAL_TABLET | Freq: Every day | ORAL | Status: DC

## 2011-06-05 NOTE — Telephone Encounter (Signed)
Patient advised as instructed via telephone.  Patient verified that she takes one tablet by mouth daily, sometimes two if she needs it but not often.

## 2011-06-05 NOTE — Telephone Encounter (Signed)
Please let her know that the prescriptions have been sent Verify the dose on the tramadol---I did not see a previous prescription

## 2011-06-05 NOTE — Telephone Encounter (Signed)
Left message on machine at home for patient to return call. 

## 2011-06-05 NOTE — Telephone Encounter (Signed)
See previous phone note.  This has already been taken care of.

## 2011-06-05 NOTE — Telephone Encounter (Signed)
Patient returned call from [facility] nurse.  Needs med today.  Call back is [phone]

## 2011-06-16 ENCOUNTER — Ambulatory Visit: Payer: Medicare Other | Admitting: Cardiovascular Disease

## 2011-06-19 ENCOUNTER — Ambulatory Visit (INDEPENDENT_AMBULATORY_CARE_PROVIDER_SITE_OTHER): Payer: Medicare Other | Admitting: Family Medicine

## 2011-06-19 ENCOUNTER — Encounter: Payer: Self-pay | Admitting: Family Medicine

## 2011-06-19 VITALS — BP 140/64 | HR 96 | Temp 98.8°F | Wt 272.0 lb

## 2011-06-19 DIAGNOSIS — J019 Acute sinusitis, unspecified: Secondary | ICD-10-CM

## 2011-06-19 MED ORDER — AMOXICILLIN-POT CLAVULANATE 875-125 MG PO TABS: 1.0000 | ORAL_TABLET | Freq: Two times a day (BID) | ORAL | Status: AC

## 2011-06-19 NOTE — Patient Instructions (Signed)
You have a sinus infection. Take medicine as prescribed: augmentin twice daily for 10 days Push fluids and plenty of rest. Nasal saline irrigation or neti pot to help drain sinuses. May use simple mucinex with plenty of fluid to help mobilize mucous. Let us know if fever >101.5, trouble opening/closing mouth, difficulty swallowing, or worsening - you may need to be seen again. 

## 2011-06-19 NOTE — Progress Notes (Signed)
  Subjective:    Patient ID: Chelsea Keller, female    DOB: 04/24/1935, 76 y.o.   MRN: [id number]  HPI CC: cough, congestion  2 wk h/o sinus congestion, cough.  Not significant chest congestion.  Cough overall dry.  + facial pressure and pain.  Blowing nose with green/yellow mucous.  + HA.  + PNdrainage and chills.  + body aches.  So far has tried Care[employer] adviser as well as tylenol cold.  No fevers.  No abd pain, nausea/vomiting, diarrhea.  No ST, fevers/chills.  + sick contacts at home.  No smokers at home.  No h/o asthma/copd.  Review of Systems Per HPI    Objective:   Physical Exam  Vitals reviewed. Constitutional: She appears well-developed and well-nourished. No distress.       Evidently congested  HENT:  Head: Normocephalic and atraumatic.  Right Ear: Hearing, tympanic membrane, external ear and ear canal normal.  Left Ear: Hearing, tympanic membrane, external ear and ear canal normal.  Nose: No mucosal edema or rhinorrhea. Right sinus exhibits no maxillary sinus tenderness and no frontal sinus tenderness. Left sinus exhibits no maxillary sinus tenderness and no frontal sinus tenderness.  Mouth/Throat: Uvula is midline, oropharynx is clear and moist and mucous membranes are normal. No oropharyngeal exudate, posterior oropharyngeal edema, posterior oropharyngeal erythema or tonsillar abscesses.       Mild sinus congestion  Eyes: Conjunctivae and EOM are normal. Pupils are equal, round, and reactive to light. No scleral icterus.  Neck: Normal range of motion. Neck supple.  Cardiovascular: Normal rate, regular rhythm and intact distal pulses.   Murmur (2/6 SEM) heard. Pulmonary/Chest: Effort normal and breath sounds normal. No respiratory distress. She has no wheezes. She has no rales.  Lymphadenopathy:    She has no cervical adenopathy.  Skin: Skin is warm and dry. No rash noted.       Assessment & Plan:

## 2011-06-19 NOTE — Assessment & Plan Note (Signed)
Anticipate bacterial given duration. Treat with augmentin. Update [place] if not improving as expected.

## 2011-07-06 ENCOUNTER — Encounter: Payer: Self-pay | Admitting: Internal Medicine

## 2011-07-06 ENCOUNTER — Ambulatory Visit (INDEPENDENT_AMBULATORY_CARE_PROVIDER_SITE_OTHER): Payer: Medicare Other | Admitting: Internal Medicine

## 2011-07-06 VITALS — BP 150/80 | HR 66 | Temp 97.9°F | Ht 62.0 in | Wt 269.0 lb

## 2011-07-06 DIAGNOSIS — J019 Acute sinusitis, unspecified: Secondary | ICD-10-CM

## 2011-07-06 MED ORDER — AMOXICILLIN-POT CLAVULANATE 875-125 MG PO TABS: 1.0000 | ORAL_TABLET | Freq: Two times a day (BID) | ORAL | Status: AC

## 2011-07-06 NOTE — Assessment & Plan Note (Signed)
Not clearly a recurrence May just be tired, etc after holidays, travel Discussed symptomatic care Will give Rx to [facility] if worsens---unlikely to need

## 2011-07-06 NOTE — Progress Notes (Signed)
Subjective:    Patient ID: Chelsea Keller, female    DOB: 1935/01/19, 76 y.o.   MRN: [id number]  HPI Did start to feel a little better before trip to [place] (12/23) Now starting to feel bad again Throat is scratchy but not sore Feels "washed out"  No sig sinus pressure or headache Doesn't have the heaviness but some stuffiness "Migraine in right eye" No cough Chronic post nasal drip  No SOB No fever  Current Outpatient Prescriptions on File Prior to Visit  Medication Sig Dispense Refill  . acetaminophen (TYLENOL) 500 MG tablet Take 500-1,000 mg by mouth daily as needed.        . clindamycin (CLEOCIN T) 1 % external solution Apply 1 application topically 2 (two) times daily.  30 mL  2  . fexofenadine (ALLEGRA) 180 MG tablet Take 180 mg by mouth daily as needed.       . Ibuprofen (ADVIL) 200 MG CAPS Take by mouth as needed.       . potassium chloride SA (KLOR-CON M20) 20 MEQ tablet Take 20 mEq by mouth daily.       . temazepam (RESTORIL) 15 MG capsule Take 1-2 once daily at bedtime as needed to help sleep  180 capsule  0  . traMADol (ULTRAM) 50 MG tablet Take 1 tablet (50 mg total) by mouth daily.  60 tablet  0  . triamterene-hydrochlorothiazide (MAXZIDE-25) 37.5-25 MG per tablet TAKE 1 TABLET BY MOUTH ONCE A DAY  90 tablet  3    Allergies  Allergen Reactions  . Celecoxib   . Codeine Sulfate   . Erythromycin Base     Past Medical History  Diagnosis Date  . Left ventricular hypertrophy   . Chronic venous insufficiency   . Hypertriglyceridemia   . Hypertension   . Arthritis   . Allergy   . Impaired fasting glucose   . Hx of colonic polyps   . Gastric ulcer   . Vitamin B12 deficiency   . Sleep disorder     Past Surgical History  Procedure Date  . Cholecystectomy   . Tonsillectomy   . External fixation wrist fracture 1985    left with pins  . External fixation ankle fracture     Fx.  left ankle-fixation with pins later removed sec to infection 1985  . Cataract  extraction 10/2003    OD  . Abdominal hysterectomy   . Hemiarthroplasty shoulder fracture 06/2009  . Femur fracture surgery 06/2009  . Total knee arthroplasty 03/09    left    Family History  Problem Relation Age of Onset  . Depression Mother   . Cancer Mother     uterine cancer  . Cancer Brother     prostate cancer  . Diabetes Maternal Aunt   . Arthritis Brother   . Asthma Brother     History   Social History  . Marital Status: Married    Spouse Name: N/A    Number of Children: 4  . Years of Education: N/A   Occupational History  . retired crossing guard    Social History Main Topics  . Smoking status: Former Smoker    Quit date: 07/06/1993  . Smokeless tobacco: Never Used  . Alcohol Use: Yes     rare  . Drug Use: No  . Sexually Active: Not on file   Other Topics Concern  . Not on file   Social History Narrative   Enjoys arts and crafts   Review of Systems  No vomiting Did have some nausea which has improved Eating fair now---appetite had been off     Objective:   Physical Exam  Constitutional: She appears well-developed and well-nourished. No distress.  HENT:  Mouth/Throat: Oropharynx is clear and moist. No oropharyngeal exudate.       No sinus tenderness Mild nasal inflammation Bleeding spot along left septum TMs normal  Neck: Normal range of motion.  Pulmonary/Chest: Effort normal and breath sounds normal. No respiratory distress. She has no wheezes. She has no rales.  Lymphadenopathy:    She has no cervical adenopathy.          Assessment & Plan:

## 2011-08-03 ENCOUNTER — Other Ambulatory Visit: Payer: Self-pay | Admitting: Internal Medicine

## 2011-08-03 NOTE — Telephone Encounter (Signed)
Pt called, would like Rx for Tramadol , 90 day supply.... [facility]. Pt call back # [phone]

## 2011-08-04 MED ORDER — TRAMADOL HCL 50 MG PO TABS: 50.0000 mg | ORAL_TABLET | Freq: Two times a day (BID) | ORAL | Status: DC | PRN

## 2011-08-04 NOTE — Telephone Encounter (Signed)
Okay #[id number] x 0 

## 2011-08-04 NOTE — Telephone Encounter (Signed)
rx sent to pharmacy by e-script  

## 2011-09-30 ENCOUNTER — Other Ambulatory Visit: Payer: Self-pay | Admitting: Internal Medicine

## 2011-09-30 ENCOUNTER — Other Ambulatory Visit: Payer: Self-pay

## 2011-09-30 NOTE — Telephone Encounter (Signed)
Last refilled 10/30/10

## 2011-09-30 NOTE — Telephone Encounter (Signed)
[facility] faxed request Temazepam 15 mg. Pt last seen 07/06/11.

## 2011-10-01 MED ORDER — TEMAZEPAM 15 MG PO CAPS: ORAL_CAPSULE | ORAL | Status: DC

## 2011-10-01 NOTE — Telephone Encounter (Signed)
Okay #[id number] x 0 

## 2011-10-01 NOTE — Telephone Encounter (Signed)
rx called into pharmacy

## 2011-10-13 ENCOUNTER — Ambulatory Visit (INDEPENDENT_AMBULATORY_CARE_PROVIDER_SITE_OTHER): Payer: Medicare Other | Admitting: Internal Medicine

## 2011-10-13 ENCOUNTER — Encounter: Payer: Self-pay | Admitting: Internal Medicine

## 2011-10-13 VITALS — BP 140/70 | HR 79 | Temp 98.3°F | Wt 249.0 lb

## 2011-10-13 DIAGNOSIS — M549 Dorsalgia, unspecified: Secondary | ICD-10-CM

## 2011-10-13 DIAGNOSIS — S239XXA Sprain of unspecified parts of thorax, initial encounter: Secondary | ICD-10-CM | POA: Diagnosis not present

## 2011-10-13 LAB — POCT URINALYSIS DIPSTICK
Ketones, UA: NEGATIVE
Leukocytes, UA: NEGATIVE
Nitrite, UA: NEGATIVE
Protein, UA: NEGATIVE
Urobilinogen, UA: NEGATIVE
pH, UA: 7

## 2011-10-13 NOTE — Progress Notes (Signed)
Subjective:    Patient ID: Chelsea Keller, female    DOB: [birth year], 77 y.o.   MRN: [id number]  HPI Having some pain along left back and flank since about 2 weeks ago No obvious trauma "gripping pain"--notices grabbing if she turns Tried 2 tylenol and tramadol tid to keep pain under control No rash there Radiates to other side---had pain along lateral right leg (but this has resolved) No radiation up back or down to left leg  Ongoing problems with rash Medications not helpful Psoriatic rash on right leg mostly Needs to see Dr Gwen Pounds again about this  Current Outpatient Prescriptions on File Prior to Visit  Medication Sig Dispense Refill  . acetaminophen (TYLENOL) 500 MG tablet Take 500-1,000 mg by mouth daily as needed.        . clindamycin (CLEOCIN T) 1 % external solution Apply 1 application topically 2 (two) times daily.  30 mL  2  . fexofenadine (ALLEGRA) 180 MG tablet Take 180 mg by mouth daily as needed.       . Ibuprofen (ADVIL) 200 MG CAPS Take by mouth as needed.       . potassium chloride SA (KLOR-CON M20) 20 MEQ tablet Take 20 mEq by mouth daily.       . temazepam (RESTORIL) 15 MG capsule Take 1-2 once daily at bedtime as needed to help sleep  180 capsule  0  . traMADol (ULTRAM) 50 MG tablet Take 1 tablet (50 mg total) by mouth 2 (two) times daily as needed.  180 tablet  0  . triamterene-hydrochlorothiazide (MAXZIDE-25) 37.5-25 MG per tablet TAKE 1 TABLET BY MOUTH ONCE A DAY  90 tablet  3    Allergies  Allergen Reactions  . Celecoxib   . Codeine Sulfate   . Erythromycin Base     Past Medical History  Diagnosis Date  . Left ventricular hypertrophy   . Chronic venous insufficiency   . Hypertriglyceridemia   . Hypertension   . Arthritis   . Allergy   . Impaired fasting glucose   . Hx of colonic polyps   . Gastric ulcer   . Vitamin B12 deficiency   . Sleep disorder     Past Surgical History  Procedure Date  . Cholecystectomy   . Tonsillectomy   .  External fixation wrist fracture 1985    left with pins  . External fixation ankle fracture     Fx.  left ankle-fixation with pins later removed sec to infection 1985  . Cataract extraction 10/2003    OD  . Abdominal hysterectomy   . Hemiarthroplasty shoulder fracture 06/2009  . Femur fracture surgery 06/2009  . Total knee arthroplasty 03/09    left    Family History  Problem Relation Age of Onset  . Depression Mother   . Cancer Mother     uterine cancer  . Cancer Brother     prostate cancer  . Diabetes Maternal Aunt   . Arthritis Brother   . Asthma Brother     History   Social History  . Marital Status: Married    Spouse Name: N/A    Number of Children: 4  . Years of Education: N/A   Occupational History  . retired crossing guard    Social History Main Topics  . Smoking status: Former Smoker    Quit date: 07/06/1993  . Smokeless tobacco: Never Used  . Alcohol Use: Yes     rare  . Drug Use: No  . Sexually Active:  Not on file   Other Topics Concern  . Not on file   Social History Narrative   Enjoys arts and crafts   Review of Systems No fever No dysuria, urgency or frequency Does note congestion in chest and postnasal drip Some wheezing    Objective:   Physical Exam  Constitutional: She appears well-developed and well-nourished. No distress.  Cardiovascular: Normal rate, regular rhythm and normal heart sounds.   Pulmonary/Chest: Effort normal and breath sounds normal. No respiratory distress. She has no wheezes. She has no rales.  Musculoskeletal:       Tenderness along T10-11 at left posterior axillary line No rib abnormalities Normal passive ROM at left shoulder  Skin:       No trunk rash          Assessment & Plan:

## 2011-10-13 NOTE — Assessment & Plan Note (Addendum)
Seems to be soft tissue injury She will try heat Continue tylenol Try to use tramadol only at night Will refer to PT if not better soon Urinalysis benign

## 2011-10-13 NOTE — Patient Instructions (Signed)
Please call for referral to physical therapy if your back is not getting better

## 2011-11-28 ENCOUNTER — Other Ambulatory Visit: Payer: Self-pay | Admitting: Internal Medicine

## 2011-12-02 ENCOUNTER — Encounter: Payer: Self-pay | Admitting: Internal Medicine

## 2011-12-02 ENCOUNTER — Ambulatory Visit (INDEPENDENT_AMBULATORY_CARE_PROVIDER_SITE_OTHER): Payer: Medicare Other | Admitting: Internal Medicine

## 2011-12-02 VITALS — BP 122/70 | HR 80 | Temp 98.4°F | Ht 62.0 in | Wt 272.0 lb

## 2011-12-02 DIAGNOSIS — I10 Essential (primary) hypertension: Secondary | ICD-10-CM | POA: Diagnosis not present

## 2011-12-02 DIAGNOSIS — Z6841 Body Mass Index (BMI) 40.0 and over, adult: Secondary | ICD-10-CM | POA: Insufficient documentation

## 2011-12-02 DIAGNOSIS — M1990 Unspecified osteoarthritis, unspecified site: Secondary | ICD-10-CM

## 2011-12-02 DIAGNOSIS — G478 Other sleep disorders: Secondary | ICD-10-CM

## 2011-12-02 DIAGNOSIS — R7301 Impaired fasting glucose: Secondary | ICD-10-CM | POA: Diagnosis not present

## 2011-12-02 DIAGNOSIS — E781 Pure hyperglyceridemia: Secondary | ICD-10-CM | POA: Diagnosis not present

## 2011-12-02 DIAGNOSIS — E538 Deficiency of other specified B group vitamins: Secondary | ICD-10-CM

## 2011-12-02 DIAGNOSIS — G479 Sleep disorder, unspecified: Secondary | ICD-10-CM

## 2011-12-02 LAB — CBC WITH DIFFERENTIAL/PLATELET
Basophils Relative: 0.4 % (ref 0.0–3.0)
Eosinophils Relative: 3.8 % (ref 0.0–5.0)
HCT: 39.6 % (ref 36.0–46.0)
Hemoglobin: 13.1 g/dL (ref 12.0–15.0)
Lymphs Abs: 2 10*3/uL (ref 0.7–4.0)
MCV: 92 fl (ref 78.0–100.0)
Monocytes Absolute: 0.7 10*3/uL (ref 0.1–1.0)
Monocytes Relative: 8 % (ref 3.0–12.0)
Neutro Abs: 5.2 10*3/uL (ref 1.4–7.7)
Platelets: 241 10*3/uL (ref 150.0–400.0)
RBC: 4.3 Mil/uL (ref 3.87–5.11)
WBC: 8.2 10*3/uL (ref 4.5–10.5)

## 2011-12-02 LAB — HEPATIC FUNCTION PANEL
ALT: 16 U/L (ref 0–35)
AST: 21 U/L (ref 0–37)
Albumin: 3.6 g/dL (ref 3.5–5.2)
Alkaline Phosphatase: 60 U/L (ref 39–117)

## 2011-12-02 LAB — LIPID PANEL
LDL Cholesterol: 88 mg/dL (ref 0–99)
Total CHOL/HDL Ratio: 3

## 2011-12-02 LAB — TSH: TSH: 0.82 u[IU]/mL (ref 0.35–5.50)

## 2011-12-02 LAB — BASIC METABOLIC PANEL
Chloride: 105 mEq/L (ref 96–112)
GFR: 74.3 mL/min (ref >60.00)
Potassium: 3.7 mEq/L (ref 3.5–5.1)
Sodium: 143 mEq/L (ref 135–145)

## 2011-12-02 LAB — HEMOGLOBIN A1C: Hgb A1c MFr Bld: 5.7 % (ref 4.6–6.5)

## 2011-12-02 MED ORDER — TRAMADOL HCL 50 MG PO TABS: 50.0000 mg | ORAL_TABLET | Freq: Two times a day (BID) | ORAL | Status: DC | PRN

## 2011-12-02 NOTE — Assessment & Plan Note (Signed)
BP Readings from Last 3 Encounters:  12/02/11 122/70  10/13/11 140/70  07/06/11 150/80   Okay control now Will check labs

## 2011-12-02 NOTE — Assessment & Plan Note (Signed)
Urged her to start [employer] on her smart phone

## 2011-12-02 NOTE — Patient Instructions (Signed)
Please start [employer]---you can do it on your smart phone

## 2011-12-02 NOTE — Assessment & Plan Note (Signed)
Uses the tramadol bid

## 2011-12-02 NOTE — Assessment & Plan Note (Signed)
Off supplements Will check level

## 2011-12-02 NOTE — Assessment & Plan Note (Signed)
Urged her to use as little temazepam as possible

## 2011-12-02 NOTE — Assessment & Plan Note (Signed)
Doesn't want meds LDL not bad Will recheck

## 2011-12-02 NOTE — Progress Notes (Signed)
Subjective:    Patient ID: Chelsea Keller, female    DOB: 1935-07-05, 77 y.o.   MRN: [id number]  HPI Here for follow up Reviewed temazepam----uses it 5/7 nights per week Discussed trying to cut down some  Back pain is better Knows she has arthritis there---and other places Uses tramadol prn--usually uses bid  Had 2 tick bites recently --about 2 weeks ago Worried because daughter may have had RMSF No rash and not sick Discussed that testing for Lyme not indicated  Doesn't check BP No headaches No chest pain No SOB No edema lately  Current Outpatient Prescriptions on File Prior to Visit  Medication Sig Dispense Refill  . acetaminophen (TYLENOL) 500 MG tablet Take 500-1,000 mg by mouth daily as needed.        . clindamycin (CLEOCIN T) 1 % external solution Apply 1 application topically 2 (two) times daily.  30 mL  2  . fexofenadine (ALLEGRA) 180 MG tablet Take 180 mg by mouth daily as needed.       . Ibuprofen (ADVIL) 200 MG CAPS Take by mouth as needed.       Marland Kitchen KLOR-CON M20 20 MEQ tablet TAKE 1 TABLET TWICE DAILY  180 tablet  2  . temazepam (RESTORIL) 15 MG capsule Take 1-2 once daily at bedtime as needed to help sleep  180 capsule  0  . triamterene-hydrochlorothiazide (MAXZIDE-25) 37.5-25 MG per tablet TAKE 1 TABLET BY MOUTH ONCE A DAY  90 tablet  3  . DISCONTD: potassium chloride SA (KLOR-CON M20) 20 MEQ tablet Take 20 mEq by mouth daily.         Allergies  Allergen Reactions  . Celecoxib   . Codeine Sulfate   . Erythromycin Base     Past Medical History  Diagnosis Date  . Left ventricular hypertrophy   . Chronic venous insufficiency   . Hypertriglyceridemia   . Hypertension   . Arthritis   . Allergy   . Impaired fasting glucose   . Hx of colonic polyps   . Gastric ulcer   . Vitamin B12 deficiency   . Sleep disorder     Past Surgical History  Procedure Date  . Cholecystectomy   . Tonsillectomy   . External fixation wrist fracture 1985    left with pins    . External fixation ankle fracture     Fx.  left ankle-fixation with pins later removed sec to infection 1985  . Cataract extraction 10/2003    OD  . Abdominal hysterectomy   . Hemiarthroplasty shoulder fracture 06/2009  . Femur fracture surgery 06/2009  . Total knee arthroplasty 03/09    left    Family History  Problem Relation Age of Onset  . Depression Mother   . Cancer Mother     uterine cancer  . Cancer Brother     prostate cancer  . Diabetes Maternal Aunt   . Arthritis Brother   . Asthma Brother     History   Social History  . Marital Status: Married    Spouse Name: N/A    Number of Children: 4  . Years of Education: N/A   Occupational History  . retired crossing guard    Social History Main Topics  . Smoking status: Former Smoker    Quit date: 07/06/1993  . Smokeless tobacco: Never Used  . Alcohol Use: Yes     rare  . Drug Use: No  . Sexually Active: Not on file   Other Topics Concern  .  Not on file   Social History Narrative   Enjoys arts and crafts   Review of Systems Weight has been stable---discussed obesity    Objective:   Physical Exam  Constitutional: She appears well-developed and well-nourished. No distress.  Neck: Normal range of motion. Neck supple.  Cardiovascular: Normal rate, regular rhythm and normal heart sounds.  Exam reveals no gallop.   No murmur heard. Pulmonary/Chest: Effort normal and breath sounds normal. No respiratory distress. She has no wheezes. She has no rales.  Musculoskeletal: She exhibits no edema and no tenderness.  Lymphadenopathy:    She has no cervical adenopathy.  Skin: No rash noted.  Psychiatric: She has a normal mood and affect. Her behavior is normal. Thought content normal.          Assessment & Plan:

## 2011-12-04 ENCOUNTER — Encounter: Payer: Self-pay | Admitting: *Deleted

## 2012-01-05 ENCOUNTER — Other Ambulatory Visit: Payer: Self-pay | Admitting: *Deleted

## 2012-01-05 MED ORDER — TRIAMTERENE-HCTZ 37.5-25 MG PO TABS: 1.0000 | ORAL_TABLET | Freq: Every day | ORAL | Status: DC

## 2012-02-10 DIAGNOSIS — M12519 Traumatic arthropathy, unspecified shoulder: Secondary | ICD-10-CM | POA: Diagnosis not present

## 2012-02-10 DIAGNOSIS — M19019 Primary osteoarthritis, unspecified shoulder: Secondary | ICD-10-CM | POA: Diagnosis not present

## 2012-02-17 DIAGNOSIS — M19019 Primary osteoarthritis, unspecified shoulder: Secondary | ICD-10-CM | POA: Diagnosis not present

## 2012-02-17 DIAGNOSIS — M12519 Traumatic arthropathy, unspecified shoulder: Secondary | ICD-10-CM | POA: Diagnosis not present

## 2012-02-17 DIAGNOSIS — T847XXA Infection and inflammatory reaction due to other internal orthopedic prosthetic devices, implants and grafts, initial encounter: Secondary | ICD-10-CM | POA: Diagnosis not present

## 2012-02-23 ENCOUNTER — Other Ambulatory Visit: Payer: Self-pay

## 2012-02-23 MED ORDER — POTASSIUM CHLORIDE CRYS ER 20 MEQ PO TBCR: 20.0000 meq | EXTENDED_RELEASE_TABLET | Freq: Two times a day (BID) | ORAL | Status: DC

## 2012-02-23 NOTE — Telephone Encounter (Signed)
Pt [facility] due to [facility] coverage. Pt notified while on phone done.

## 2012-03-10 ENCOUNTER — Telehealth: Payer: Self-pay | Admitting: Internal Medicine

## 2012-03-10 MED ORDER — LORAZEPAM 0.5 MG PO TABS: 0.2500 mg | ORAL_TABLET | Freq: Three times a day (TID) | ORAL | Status: DC | PRN

## 2012-03-10 NOTE — Telephone Encounter (Signed)
Pt's daughter is calling to let us know that Chelsea Keller has passed away and [name] was wanting to know if there was anything she could take to calm her down??

## 2012-03-10 NOTE — Telephone Encounter (Signed)
Okay lorazepam 0.5mg  1/2-1 tid prn for nerves #30 x 0

## 2012-03-10 NOTE — Telephone Encounter (Signed)
Spoke with patient and advised results rx called into pharmacy  

## 2012-03-16 DIAGNOSIS — M25569 Pain in unspecified knee: Secondary | ICD-10-CM | POA: Diagnosis not present

## 2012-04-05 ENCOUNTER — Telehealth: Payer: Self-pay | Admitting: Internal Medicine

## 2012-04-05 MED ORDER — TRIAMTERENE-HCTZ 37.5-25 MG PO TABS: 1.0000 | ORAL_TABLET | Freq: Every day | ORAL | Status: DC

## 2012-04-05 NOTE — Telephone Encounter (Signed)
The pt called the triage line hoping to get a refill of Triamterene-hydrochlorothiazide [facility].

## 2012-04-05 NOTE — Telephone Encounter (Signed)
rx sent to pharmacy by [facility] Left message that [facility]

## 2012-04-29 ENCOUNTER — Encounter: Payer: Self-pay | Admitting: Internal Medicine

## 2012-04-29 ENCOUNTER — Ambulatory Visit (INDEPENDENT_AMBULATORY_CARE_PROVIDER_SITE_OTHER): Payer: Medicare Other | Admitting: Internal Medicine

## 2012-04-29 VITALS — BP 160/80 | HR 85 | Temp 98.1°F | Wt 281.0 lb

## 2012-04-29 DIAGNOSIS — I10 Essential (primary) hypertension: Secondary | ICD-10-CM | POA: Diagnosis not present

## 2012-04-29 DIAGNOSIS — IMO0001 Reserved for inherently not codable concepts without codable children: Secondary | ICD-10-CM | POA: Diagnosis not present

## 2012-04-29 DIAGNOSIS — M1990 Unspecified osteoarthritis, unspecified site: Secondary | ICD-10-CM

## 2012-04-29 DIAGNOSIS — M791 Myalgia, unspecified site: Secondary | ICD-10-CM

## 2012-04-29 DIAGNOSIS — G479 Sleep disorder, unspecified: Secondary | ICD-10-CM

## 2012-04-29 MED ORDER — TRAMADOL HCL 50 MG PO TABS: 50.0000 mg | ORAL_TABLET | Freq: Three times a day (TID) | ORAL | Status: DC | PRN

## 2012-04-29 NOTE — Progress Notes (Signed)
Subjective:    Patient ID: Chelsea Keller, female    DOB: [birth year], 77 y.o.   MRN: [id number]  HPI Husband died almost 2 months ago She feels she is doing okay Trouble sleeping---but she relates this to "the pain factor" Pain in her entire body--but right shoulder is worst Saw Dr Beverlyn Roux recommended surgery for bone spurs. She is considering Pain in back and legs--wonders about fibromyalgia Tramadol does help the pain somewhat---uses 2 tylenol at the same time Now using tid regularly Feels it in muscles--but points just at right deltoids/biceps Doesn't feel she has problems with grieving  No chest pain Gets cough and SOB due to nasal congestion---when she lies down No problems getting up steps---other than her weight Gets regular dizziness----rotatory vertigo when supine No syncope  Current Outpatient Prescriptions on File Prior to Visit  Medication Sig Dispense Refill  . acetaminophen (TYLENOL) 500 MG tablet Take 500-1,000 mg by mouth daily as needed.        . fexofenadine (ALLEGRA) 180 MG tablet Take 180 mg by mouth daily as needed.       . Ibuprofen (ADVIL) 200 MG CAPS Take by mouth as needed.       . potassium chloride SA (KLOR-CON M20) 20 MEQ tablet Take 1 tablet (20 mEq total) by mouth 2 (two) times daily.  180 tablet  2  . traMADol (ULTRAM) 50 MG tablet Take 1 tablet (50 mg total) by mouth 2 (two) times daily as needed.  180 tablet  0  . triamterene-hydrochlorothiazide (MAXZIDE-25) 37.5-25 MG per tablet Take 1 each (1 tablet total) by mouth daily.  90 tablet  3    Allergies  Allergen Reactions  . Celecoxib   . Codeine Sulfate   . Erythromycin Base     Past Medical History  Diagnosis Date  . Left ventricular hypertrophy   . Chronic venous insufficiency   . Hypertriglyceridemia   . Hypertension   . Arthritis   . Allergy   . Impaired fasting glucose   . Hx of colonic polyps   . Gastric ulcer   . Vitamin B12 deficiency   . Sleep disorder     Past Surgical  History  Procedure Date  . Cholecystectomy   . Tonsillectomy   . External fixation wrist fracture 1985    left with pins  . External fixation ankle fracture     Fx.  left ankle-fixation with pins later removed sec to infection 1985  . Cataract extraction 10/2003    OD  . Abdominal hysterectomy   . Hemiarthroplasty shoulder fracture 06/2009  . Femur fracture surgery 06/2009  . Total knee arthroplasty 03/09    left    Family History  Problem Relation Age of Onset  . Depression Mother   . Cancer Mother     uterine cancer  . Cancer Brother     prostate cancer  . Diabetes Maternal Aunt   . Arthritis Brother   . Asthma Brother     History   Social History  . Marital Status: Widowed    Spouse Name: N/A    Number of Children: 4  . Years of Education: N/A   Occupational History  . retired crossing guard    Social History Main Topics  . Smoking status: Former Smoker    Quit date: 07/06/1993  . Smokeless tobacco: Never Used  . Alcohol Use: Yes     rare  . Drug Use: No  . Sexually Active: Not on file   Other Topics  Concern  . Not on file   Social History Narrative   Enjoys arts and crafts  '   Review of Systems No fevers Has gained a lot of weight--she eats when she is up at night No unilateral vision loss but gets ocular migraines No jaw claudication    Objective:   Physical Exam  Constitutional: No distress.       obese  Neck: Normal range of motion. Neck supple.  Cardiovascular: Normal rate, regular rhythm and normal heart sounds.  Exam reveals no gallop.   No murmur heard. Pulmonary/Chest: Effort normal and breath sounds normal. No respiratory distress. She has no wheezes. She has no rales.  Abdominal: Soft. There is no tenderness.  Musculoskeletal:       Limited abduction, no external rotation, decreased internal rotation of right shoulder  Thick calves without pitting  Lymphadenopathy:    She has no cervical adenopathy.  Psychiatric: She has a  normal mood and affect. Her behavior is normal.          Assessment & Plan:

## 2012-04-29 NOTE — Assessment & Plan Note (Signed)
Vague and mostly in right arm (and I think it may be secondary to the shoulder arthritis) Will check ESR though I don't think PMR is likely Needs to walk regularly if possible

## 2012-04-29 NOTE — Assessment & Plan Note (Signed)
Mostly in shoulder May need surgery on right shoulder

## 2012-04-29 NOTE — Assessment & Plan Note (Signed)
BP Readings from Last 3 Encounters:  04/29/12 160/80  12/02/11 122/70  10/13/11 140/70   Up today Had been okay No changes for now

## 2012-04-29 NOTE — Assessment & Plan Note (Signed)
Stopped temazepam due to concerns about becoming dependent Sleep issues are probably adding to myalgias Recommended she use it every other night

## 2012-04-30 LAB — SEDIMENTATION RATE: Sed Rate: 12 mm/hr (ref 0–22)

## 2012-05-02 ENCOUNTER — Encounter: Payer: Self-pay | Admitting: *Deleted

## 2012-06-14 ENCOUNTER — Encounter: Payer: Self-pay | Admitting: Cardiovascular Disease

## 2012-06-14 ENCOUNTER — Ambulatory Visit (INDEPENDENT_AMBULATORY_CARE_PROVIDER_SITE_OTHER): Payer: Medicare Other | Admitting: Cardiovascular Disease

## 2012-06-14 ENCOUNTER — Encounter: Payer: Self-pay | Admitting: Internal Medicine

## 2012-06-14 ENCOUNTER — Ambulatory Visit (INDEPENDENT_AMBULATORY_CARE_PROVIDER_SITE_OTHER): Payer: Medicare Other | Admitting: Internal Medicine

## 2012-06-14 VITALS — BP 158/80 | HR 101 | Temp 98.0°F | Wt 280.0 lb

## 2012-06-14 VITALS — BP 160/80 | HR 79 | Ht 62.0 in | Wt 278.0 lb

## 2012-06-14 DIAGNOSIS — I10 Essential (primary) hypertension: Secondary | ICD-10-CM | POA: Diagnosis not present

## 2012-06-14 DIAGNOSIS — E781 Pure hyperglyceridemia: Secondary | ICD-10-CM | POA: Diagnosis not present

## 2012-06-14 DIAGNOSIS — R609 Edema, unspecified: Secondary | ICD-10-CM | POA: Diagnosis not present

## 2012-06-14 DIAGNOSIS — Z0000 Encounter for general adult medical examination without abnormal findings: Secondary | ICD-10-CM | POA: Diagnosis not present

## 2012-06-14 DIAGNOSIS — R6889 Other general symptoms and signs: Secondary | ICD-10-CM | POA: Diagnosis not present

## 2012-06-14 DIAGNOSIS — R52 Pain, unspecified: Secondary | ICD-10-CM | POA: Diagnosis not present

## 2012-06-14 DIAGNOSIS — E538 Deficiency of other specified B group vitamins: Secondary | ICD-10-CM | POA: Diagnosis not present

## 2012-06-14 DIAGNOSIS — R4584 Anhedonia: Secondary | ICD-10-CM

## 2012-06-14 DIAGNOSIS — R7301 Impaired fasting glucose: Secondary | ICD-10-CM

## 2012-06-14 DIAGNOSIS — R600 Localized edema: Secondary | ICD-10-CM

## 2012-06-14 DIAGNOSIS — G479 Sleep disorder, unspecified: Secondary | ICD-10-CM

## 2012-06-14 LAB — HEMOGLOBIN A1C: Hgb A1c MFr Bld: 5.9 % (ref 4.6–6.5)

## 2012-06-14 LAB — CBC WITH DIFFERENTIAL/PLATELET
Basophils Absolute: 0 10*3/uL (ref 0.0–0.1)
Eosinophils Absolute: 0.4 10*3/uL (ref 0.0–0.7)
HCT: 39.1 % (ref 36.0–46.0)
Hemoglobin: 12.7 g/dL (ref 12.0–15.0)
Lymphs Abs: 3 10*3/uL (ref 0.7–4.0)
MCHC: 32.4 g/dL (ref 30.0–36.0)
Neutro Abs: 5.8 10*3/uL (ref 1.4–7.7)
RDW: 14.9 % — ABNORMAL HIGH (ref 11.5–14.6)

## 2012-06-14 LAB — BASIC METABOLIC PANEL
CO2: 28 mEq/L (ref 19–32)
Glucose, Bld: 111 mg/dL — ABNORMAL HIGH (ref 70–99)
Potassium: 4 mEq/L (ref 3.5–5.1)
Sodium: 140 mEq/L (ref 135–145)

## 2012-06-14 LAB — LIPID PANEL: HDL: 41.3 mg/dL (ref >39.00)

## 2012-06-14 LAB — HEPATIC FUNCTION PANEL
Albumin: 3.8 g/dL (ref 3.5–5.2)
Alkaline Phosphatase: 68 U/L (ref 39–117)
Total Protein: 6.7 g/dL (ref 6.0–8.3)

## 2012-06-14 MED ORDER — TEMAZEPAM 15 MG PO CAPS: 15.0000 mg | ORAL_CAPSULE | Freq: Every evening | ORAL | Status: DC | PRN

## 2012-06-14 MED ORDER — FUROSEMIDE 40 MG PO TABS: 40.0000 mg | ORAL_TABLET | Freq: Every day | ORAL | Status: DC

## 2012-06-14 NOTE — Progress Notes (Signed)
Subjective:    Patient ID: Chelsea Keller, female    DOB: [birth year], 77 y.o.   MRN: [id number]  HPI Here for Wellness visit and follow up Just sees Chelsea Keller Reviewed advanced directives No falls Has had mood problems Independent with ADLs and instrumental ADLs Hearing and vision are okay Doesn't note any memory issues  Working through things with husband's estate Had will from [place] so troubles with this Some mood issues---clearly anhedonic. Even stopped her arts and crafts Will go out but not alone Overeating, sleeping too much PHQ-9  -- 15 Discussed bereavement counseling from hospice SIL has just unexpectedly died also  No chest pain Stable DOE---very limited No palpitations  Ongoing arthritis in hands Uses just tylenol Limits her with her crafts Current Outpatient Prescriptions on File Prior to Visit  Medication Sig Dispense Refill  . acetaminophen (TYLENOL) 500 MG tablet Take 500-1,000 mg by mouth daily as needed.        . fexofenadine (ALLEGRA) 180 MG tablet Take 180 mg by mouth daily as needed.       . Ibuprofen (ADVIL) 200 MG CAPS Take by mouth as needed.       . potassium chloride SA (KLOR-CON M20) 20 MEQ tablet Take 1 tablet (20 mEq total) by mouth 2 (two) times daily.  180 tablet  2  . temazepam (RESTORIL) 15 MG capsule Take 15 mg by mouth at bedtime as needed.      . traMADol (ULTRAM) 50 MG tablet Take 1 tablet (50 mg total) by mouth 3 (three) times daily as needed.  270 tablet  0  . triamterene-hydrochlorothiazide (MAXZIDE-25) 37.5-25 MG per tablet Take 1 each (1 tablet total) by mouth daily.  90 tablet  3    Allergies  Allergen Reactions  . Celecoxib   . Codeine Sulfate   . Erythromycin Base     Past Medical History  Diagnosis Date  . Left ventricular hypertrophy   . Chronic venous insufficiency   . Hypertriglyceridemia   . Hypertension   . Arthritis   . Allergy   . Impaired fasting glucose   . Hx of colonic polyps   . Gastric ulcer   .  Vitamin B12 deficiency   . Sleep disorder     Past Surgical History  Procedure Date  . Cholecystectomy   . Tonsillectomy   . External fixation wrist fracture 1985    left with pins  . External fixation ankle fracture     Fx.  left ankle-fixation with pins later removed sec to infection 1985  . Cataract extraction 10/2003    OD  . Abdominal hysterectomy   . Hemiarthroplasty shoulder fracture 06/2009  . Femur fracture surgery 06/2009  . Total knee arthroplasty 03/09    left    Family History  Problem Relation Age of Onset  . Depression Mother   . Cancer Mother     uterine cancer  . Cancer Brother     prostate cancer  . Diabetes Maternal Aunt   . Arthritis Brother   . Asthma Brother     History   Social History  . Marital Status: Widowed    Spouse Name: N/A    Number of Children: 4  . Years of Education: N/A   Occupational History  . retired crossing guard    Social History Main Topics  . Smoking status: Former Smoker    Quit date: 07/06/1993  . Smokeless tobacco: Never Used  . Alcohol Use: Yes  Comment: rare  . Drug Use: No  . Sexually Active: Not on file   Other Topics Concern  . Not on file   Social History Narrative   No living willPlans to do health care POA forms---wants daughter CindyWould accept resuscitation attempts---no prolonged ventilationAbsolutely no feeding tube   Review of Systems Sleeps too much and often doesn't fall asleep till 4AM---then sleeps to 1PM Weight continues to rise Still with some muscle pain     Objective:   Physical Exam  Constitutional: She is oriented to person, place, and time. No distress.       obese  Neck: Normal range of motion. No thyromegaly present.  Cardiovascular: Normal rate, regular rhythm, normal heart sounds and intact distal pulses.  Exam reveals no gallop.   No murmur heard. Pulmonary/Chest: Effort normal and breath sounds normal. No respiratory distress. She has no wheezes. She has no rales.   Musculoskeletal: She exhibits edema. She exhibits no tenderness.       2+ non pitting edema  Lymphadenopathy:    She has no cervical adenopathy.  Neurological: She is alert and oriented to person, place, and time.       President-- "Obama, Bush, Clinton" [id number] [phone] Recall 2/3  Psychiatric: Thought content normal.       Normal speech No depression in visit          Assessment & Plan:

## 2012-06-14 NOTE — Assessment & Plan Note (Signed)
Uses the temazepam but tries to skip days

## 2012-06-14 NOTE — Assessment & Plan Note (Signed)
BP Readings from Last 3 Encounters:  06/14/12 158/80  04/29/12 160/80  12/02/11 122/70   Still up some Will work on fitness before increasing meds

## 2012-06-14 NOTE — Progress Notes (Signed)
History of Present Illness: 77 yo WF with history of obesity, HTN and chronic lower extremity edema here today for a routine visit. Chelsea Keller has a remote history of tobacco abuse. Chelsea Keller has had a normal myoview stress test in April of 2008. I saw her last in November 2012. Her echo in May 2010 showed normal LV function and no significant valvular issues.   Chelsea Keller is here today for follow up. Chelsea Keller has chronic lower extremity edema. Chelsea Keller has been eating poorly since her husbands death and has gained weight. Chelsea Keller has not been taking Lasix as prescribed.  Chelsea Keller has had no chest pain, SOB or palpitations.  Primary Care Physician: Alphonsus Sias  Last Lipid Profile:Lipid Panel     Component Value Date/Time   CHOL 183 06/14/2012 1034   TRIG 134.0 06/14/2012 1034   HDL 41.30 06/14/2012 1034   CHOLHDL 4 06/14/2012 1034   VLDL 26.8 06/14/2012 1034   LDLCALC 115* 06/14/2012 1034     Past Medical History  Diagnosis Date  . Left ventricular hypertrophy   . Chronic venous insufficiency   . Hypertriglyceridemia   . Hypertension   . Arthritis   . Allergy   . Impaired fasting glucose   . Hx of colonic polyps   . Gastric ulcer   . Vitamin B12 deficiency   . Sleep disorder     Past Surgical History  Procedure Date  . Cholecystectomy   . Tonsillectomy   . External fixation wrist fracture 1985    left with pins  . External fixation ankle fracture     Fx.  left ankle-fixation with pins later removed sec to infection 1985  . Cataract extraction 10/2003    OD  . Abdominal hysterectomy   . Hemiarthroplasty shoulder fracture 06/2009  . Femur fracture surgery 06/2009  . Total knee arthroplasty 03/09    left    Current Outpatient Prescriptions  Medication Sig Dispense Refill  . acetaminophen (TYLENOL) 500 MG tablet Take 500-1,000 mg by mouth daily as needed.        . fexofenadine (ALLEGRA) 180 MG tablet Take 180 mg by mouth daily as needed.       . Ibuprofen (ADVIL) 200 MG CAPS Take by mouth as needed.         . potassium chloride SA (KLOR-CON M20) 20 MEQ tablet Take 1 tablet (20 mEq total) by mouth 2 (two) times daily.  180 tablet  2  . temazepam (RESTORIL) 15 MG capsule Take 1 capsule (15 mg total) by mouth at bedtime as needed.  90 capsule  0  . traMADol (ULTRAM) 50 MG tablet Take 1 tablet (50 mg total) by mouth 3 (three) times daily as needed.  270 tablet  0  . triamterene-hydrochlorothiazide (MAXZIDE-25) 37.5-25 MG per tablet Take 1 each (1 tablet total) by mouth daily.  90 tablet  3    Allergies  Allergen Reactions  . Celecoxib   . Codeine Sulfate   . Erythromycin Base     History   Social History  . Marital Status: Widowed    Spouse Name: N/A    Number of Children: 4  . Years of Education: N/A   Occupational History  . retired crossing guard    Social History Main Topics  . Smoking status: Former Smoker    Quit date: 07/06/1993  . Smokeless tobacco: Never Used  . Alcohol Use: Yes     Comment: rare  . Drug Use: No  . Sexually Active: Not on file  Other Topics Concern  . Not on file   Social History Narrative   No living willPlans to do health care POA forms---wants daughter CindyWould accept resuscitation attempts---no prolonged ventilationAbsolutely no feeding tube    Family History  Problem Relation Age of Onset  . Depression Mother   . Cancer Mother     uterine cancer  . Cancer Brother     prostate cancer  . Diabetes Maternal Aunt   . Arthritis Brother   . Asthma Brother     Review of Systems:  As stated in the HPI and otherwise negative.   BP 160/80  Pulse 79  Ht 5\' 2"  (1.575 m)  Wt 278 lb (126.1 kg)  BMI 50.85 kg/m2  Physical Examination: General: Well developed, well nourished, NAD HEENT: OP clear, mucus membranes moist SKIN: warm, dry. No rashes. Neuro: No focal deficits Musculoskeletal: Muscle strength 5/5 all ext Psychiatric: Mood and affect normal Neck: No JVD, no carotid bruits, no thyromegaly, no lymphadenopathy. Lungs:Clear  bilaterally, no wheezes, rhonci, crackles Cardiovascular: Regular rate and rhythm. No murmurs, gallops or rubs. Abdomen:Soft. Bowel sounds present. Non-tender.  Extremities: 1+ bilateral lower extremity edema. Pulses are hard to palpate bilaterally secondary to edema.   EKG: NSR, rate 79 bpm. LAD. Poor R wave progression.   Assessment and Plan:   1. Lower extremity edema: Likely dependent edema. Bilateral. Will start Lasix 40 mg po Qdaily.   2. HTN: BP elevated today. Chelsea Keller does not wish to change her meds despite my attempts to do this. Chelsea Keller will check at home and let us know if it remains elevated.

## 2012-06-14 NOTE — Assessment & Plan Note (Signed)
I have personally reviewed the Medicare Annual Wellness questionnaire and have noted 1. The patient's medical and social history 2. Their use of alcohol, tobacco or illicit drugs 3. Their current medications and supplements 4. The patient's functional ability including ADL's, fall risks, home safety risks and hearing or visual             impairment. 5. Diet and physical activities 6. Evidence for depression or mood disorders  The patients weight, height, BMI and visual acuity have been recorded in the chart I have made referrals, counseling and provided education to the patient based review of the above and I have provided the pt with a written personalized care plan for preventive services.  I have provided you with a copy of your personalized plan for preventive services. Please take the time to review along with your updated medication list.  Doesn't want imms or cancer screening Discussed fitness---info given

## 2012-06-14 NOTE — Patient Instructions (Signed)
Please call hospice to set up appointment with a bereavement counselor.

## 2012-06-14 NOTE — Patient Instructions (Addendum)
Your physician wants you to follow-up in:6 months.  You will receive a reminder letter in the mail two months in advance. If you don't receive a letter, please call our office to schedule the follow-up appointment.   Your physician has recommended you make the following change in your medication:  Start furosemide 40 mg by mouth daily     

## 2012-06-14 NOTE — Assessment & Plan Note (Signed)
Stable but persistent

## 2012-06-14 NOTE — Assessment & Plan Note (Signed)
Will recheck labs 

## 2012-06-14 NOTE — Assessment & Plan Note (Signed)
Seems to be part of her adjustment to husband's death Advised her to call hospice for couseling

## 2012-07-07 ENCOUNTER — Encounter: Payer: Self-pay | Admitting: *Deleted

## 2012-07-13 ENCOUNTER — Telehealth: Payer: Self-pay | Admitting: Internal Medicine

## 2012-07-13 NOTE — Telephone Encounter (Signed)
Chelsea Keller received letter about lab results. Home phone not a working number.  Please call on mobile number.     Relayed information per EMR to pt.  Pt is not taking a supplement and she will start an oral supplement today and follow up in 6 wks.  No triage.

## 2012-07-14 NOTE — Telephone Encounter (Signed)
Spoke with patient and advised results  Lab appt scheduled  

## 2012-07-14 NOTE — Telephone Encounter (Signed)
Please set up the repeat B12 level in about 6 weeks

## 2012-08-16 ENCOUNTER — Other Ambulatory Visit (INDEPENDENT_AMBULATORY_CARE_PROVIDER_SITE_OTHER): Payer: Medicare Other

## 2012-08-16 DIAGNOSIS — E538 Deficiency of other specified B group vitamins: Secondary | ICD-10-CM

## 2012-09-23 ENCOUNTER — Encounter: Payer: Self-pay | Admitting: Internal Medicine

## 2012-09-23 ENCOUNTER — Ambulatory Visit (INDEPENDENT_AMBULATORY_CARE_PROVIDER_SITE_OTHER): Payer: Medicare Other | Admitting: Internal Medicine

## 2012-09-23 VITALS — BP 160/80 | HR 92 | Temp 98.2°F | Wt 282.0 lb

## 2012-09-23 DIAGNOSIS — I10 Essential (primary) hypertension: Secondary | ICD-10-CM

## 2012-09-23 MED ORDER — TEMAZEPAM 15 MG PO CAPS: 15.0000 mg | ORAL_CAPSULE | Freq: Every evening | ORAL | Status: DC | PRN

## 2012-09-23 MED ORDER — LOSARTAN POTASSIUM-HCTZ 100-25 MG PO TABS: 1.0000 | ORAL_TABLET | Freq: Every day | ORAL | Status: DC

## 2012-09-23 NOTE — Assessment & Plan Note (Signed)
BP Readings from Last 3 Encounters:  09/23/12 160/80  06/14/12 160/80  06/14/12 158/80   suboptimal control and having symptoms Will add ARB Watch potassium (labs next time)

## 2012-09-23 NOTE — Patient Instructions (Signed)
I recommend Drs Scientist, clinical (histocompatibility and immunogenetics) for dermatology (in [place])

## 2012-09-23 NOTE — Progress Notes (Signed)
Subjective:    Patient ID: Chelsea Keller, female    DOB: 1935/06/14, 78 y.o.   MRN: [id number]  HPI Concerned about her blood pressure Did feel the furosemide helps Now just using prn  Has some light headedness---wonders if this is related to BP Gets pulsating feeling in ears Gets headaches No chest pain DOE which is stable  Hasn't been checking BP  Current Outpatient Prescriptions on File Prior to Visit  Medication Sig Dispense Refill  . acetaminophen (TYLENOL) 500 MG tablet Take 500-1,000 mg by mouth daily as needed.        . fexofenadine (ALLEGRA) 180 MG tablet Take 180 mg by mouth daily as needed.       . Ibuprofen (ADVIL) 200 MG CAPS Take by mouth as needed.       . potassium chloride SA (KLOR-CON M20) 20 MEQ tablet Take 1 tablet (20 mEq total) by mouth 2 (two) times daily.  180 tablet  2  . temazepam (RESTORIL) 15 MG capsule Take 1 capsule (15 mg total) by mouth at bedtime as needed.  90 capsule  0  . traMADol (ULTRAM) 50 MG tablet Take 1 tablet (50 mg total) by mouth 3 (three) times daily as needed.  270 tablet  0  . triamterene-hydrochlorothiazide (MAXZIDE-25) 37.5-25 MG per tablet Take 1 each (1 tablet total) by mouth daily.  90 tablet  3   No current facility-administered medications on file prior to visit.    Allergies  Allergen Reactions  . Celecoxib   . Codeine Sulfate   . Erythromycin Base     Past Medical History  Diagnosis Date  . Left ventricular hypertrophy   . Chronic venous insufficiency   . Hypertriglyceridemia   . Hypertension   . Arthritis   . Allergy   . Impaired fasting glucose   . Hx of colonic polyps   . Gastric ulcer   . Vitamin B12 deficiency   . Sleep disorder     Past Surgical History  Procedure Laterality Date  . Cholecystectomy    . Tonsillectomy    . External fixation wrist fracture  1985    left with pins  . External fixation ankle fracture      Fx.  left ankle-fixation with pins later removed sec to infection 1985  .  Cataract extraction  10/2003    OD  . Abdominal hysterectomy    . Hemiarthroplasty shoulder fracture  06/2009  . Femur fracture surgery  06/2009  . Total knee arthroplasty  03/09    left    Family History  Problem Relation Age of Onset  . Depression Mother   . Cancer Mother     uterine cancer  . Cancer Brother     prostate cancer  . Diabetes Maternal Aunt   . Arthritis Brother   . Asthma Brother     History   Social History  . Marital Status: Widowed    Spouse Name: N/A    Number of Children: 4  . Years of Education: N/A   Occupational History  . retired crossing guard    Social History Main Topics  . Smoking status: Former Smoker    Quit date: 07/06/1993  . Smokeless tobacco: Never Used  . Alcohol Use: Yes     Comment: rare  . Drug Use: No  . Sexually Active: Not on file   Other Topics Concern  . Not on file   Social History Narrative   No living will   Plans to do  health care POA forms---wants daughter Arline Asp   Would accept resuscitation attempts---no prolonged ventilation   Absolutely no feeding tube   Review of Systems Doesn't sleep well--ongoing problem for many years. Night owl (often sleeps 8AM to 2PM) Weight is fairly stable Has tried to stop snacking at night     Objective:   Physical Exam  Constitutional: She appears well-developed and well-nourished. No distress.  Neck: Normal range of motion. No thyromegaly present.  Cardiovascular: Normal rate, regular rhythm and normal heart sounds.  Exam reveals no gallop.   No murmur heard. Pulmonary/Chest: Effort normal and breath sounds normal. No respiratory distress. She has no wheezes. She has no rales.  Musculoskeletal: She exhibits edema.  Thick 4+ edema without pitting (right) 3+ on left  Lymphadenopathy:    She has no cervical adenopathy.  Skin:  Psoriatic plaques on legs          Assessment & Plan:

## 2012-10-10 ENCOUNTER — Telehealth: Payer: Self-pay

## 2012-10-10 NOTE — Telephone Encounter (Signed)
Pt left v/m; she had not [facility] about new BP med. When I called pt back pt had [facility] with [facility] and med question taken care of.

## 2012-10-24 ENCOUNTER — Ambulatory Visit (INDEPENDENT_AMBULATORY_CARE_PROVIDER_SITE_OTHER): Payer: Medicare Other | Admitting: Internal Medicine

## 2012-10-24 ENCOUNTER — Encounter: Payer: Self-pay | Admitting: Internal Medicine

## 2012-10-24 VITALS — BP 128/70 | HR 91 | Temp 97.4°F | Wt 287.5 lb

## 2012-10-24 DIAGNOSIS — I10 Essential (primary) hypertension: Secondary | ICD-10-CM | POA: Diagnosis not present

## 2012-10-24 DIAGNOSIS — R51 Headache: Secondary | ICD-10-CM | POA: Diagnosis not present

## 2012-10-24 DIAGNOSIS — R519 Headache, unspecified: Secondary | ICD-10-CM | POA: Insufficient documentation

## 2012-10-24 LAB — BASIC METABOLIC PANEL
BUN: 17 mg/dL (ref 6–23)
Calcium: 9 mg/dL (ref 8.4–10.5)
Creatinine, Ser: 1 mg/dL (ref 0.4–1.2)
GFR: 60.06 mL/min (ref >60.00)

## 2012-10-24 MED ORDER — TRAMADOL HCL 50 MG PO TABS: 50.0000 mg | ORAL_TABLET | Freq: Three times a day (TID) | ORAL | Status: DC | PRN

## 2012-10-24 NOTE — Progress Notes (Signed)
Subjective:    Patient ID: Chelsea Keller, female    DOB: Aug 24, 1934, 78 y.o.   MRN: [id number]  HPI Has had a bad pounding in her ears for the past few days--worse at night Headache on top of head Hasn't checked BP at home--no cuff Concerned that her BP was too high Takes a real effort for her to walk---needs the rollator again Nauseated in past couple of days Not sleeping--- tired Falls asleep at 8AM and then sleeps into the afternoon Ran out of the temazepam--still waiting for it in the mail  Some eye itching and sneezing--relates to allergies Ran out of zyrtec  Current Outpatient Prescriptions on File Prior to Visit  Medication Sig Dispense Refill  . acetaminophen (TYLENOL) 500 MG tablet Take 500-1,000 mg by mouth daily as needed.        . Ibuprofen (ADVIL) 200 MG CAPS Take by mouth as needed.       Marland Kitchen losartan-hydrochlorothiazide (HYZAAR) 100-25 MG per tablet Take 1 tablet by mouth daily.  90 tablet  3  . potassium chloride SA (KLOR-CON M20) 20 MEQ tablet Take 1 tablet (20 mEq total) by mouth 2 (two) times daily.  180 tablet  2  . fexofenadine (ALLEGRA) 180 MG tablet Take 180 mg by mouth daily as needed.       . furosemide (LASIX) 40 MG tablet Take 40 mg by mouth daily as needed.      . temazepam (RESTORIL) 15 MG capsule Take 1 capsule (15 mg total) by mouth at bedtime as needed.  90 capsule  0  . traMADol (ULTRAM) 50 MG tablet Take 1 tablet (50 mg total) by mouth 3 (three) times daily as needed.  270 tablet  0   No current facility-administered medications on file prior to visit.    Allergies  Allergen Reactions  . Celecoxib   . Codeine Sulfate   . Erythromycin Base     Past Medical History  Diagnosis Date  . Left ventricular hypertrophy   . Chronic venous insufficiency   . Hypertriglyceridemia   . Hypertension   . Arthritis   . Allergy   . Impaired fasting glucose   . Hx of colonic polyps   . Gastric ulcer   . Vitamin B12 deficiency   . Sleep disorder      Past Surgical History  Procedure Laterality Date  . Cholecystectomy    . Tonsillectomy    . External fixation wrist fracture  1985    left with pins  . External fixation ankle fracture      Fx.  left ankle-fixation with pins later removed sec to infection 1985  . Cataract extraction  10/2003    OD  . Abdominal hysterectomy    . Hemiarthroplasty shoulder fracture  06/2009  . Femur fracture surgery  06/2009  . Total knee arthroplasty  03/09    left    Family History  Problem Relation Age of Onset  . Depression Mother   . Cancer Mother     uterine cancer  . Cancer Brother     prostate cancer  . Diabetes Maternal Aunt   . Arthritis Brother   . Asthma Brother     History   Social History  . Marital Status: Widowed    Spouse Name: N/A    Number of Children: 4  . Years of Education: N/A   Occupational History  . retired crossing guard    Social History Main Topics  . Smoking status: Former Smoker  Quit date: 07/06/1993  . Smokeless tobacco: Never Used  . Alcohol Use: Yes     Comment: rare  . Drug Use: No  . Sexually Active: Not on file   Other Topics Concern  . Not on file   Social History Narrative   No living will   Plans to do health care POA forms---wants daughter Arline Asp   Would accept resuscitation attempts---no prolonged ventilation   Absolutely no feeding tube   Review of Systems No chest pain Stable DOE---has gained more weight and relates to this    Objective:   Physical Exam  Constitutional: She appears well-developed. No distress.  Eyes: Conjunctivae are normal. Pupils are equal, round, and reactive to light.  Neck: Normal range of motion. Neck supple.  Cardiovascular: Normal rate, regular rhythm and normal heart sounds.  Exam reveals no gallop.   No murmur heard. Pulmonary/Chest: Effort normal and breath sounds normal. No respiratory distress. She has no wheezes. She has no rales.  Lymphadenopathy:    She has no cervical adenopathy.   Neurological:  Not ataxic but walks bent over No focal weakness          Assessment & Plan:

## 2012-10-24 NOTE — Assessment & Plan Note (Signed)
No clear etiology ?from allergies---will have her restart the zyrtec Waiting for the temazepam---she needs to get back to normal sleep schedule BP is now okay

## 2012-10-24 NOTE — Assessment & Plan Note (Signed)
BP Readings from Last 3 Encounters:  10/24/12 128/70  09/23/12 160/80  06/14/12 160/80   Much better now Will check labs

## 2012-11-07 ENCOUNTER — Ambulatory Visit (INDEPENDENT_AMBULATORY_CARE_PROVIDER_SITE_OTHER): Payer: Medicare Other | Admitting: Internal Medicine

## 2012-11-07 ENCOUNTER — Encounter: Payer: Self-pay | Admitting: Internal Medicine

## 2012-11-07 VITALS — BP 140/80 | HR 90 | Temp 98.3°F | Wt 289.0 lb

## 2012-11-07 DIAGNOSIS — G479 Sleep disorder, unspecified: Secondary | ICD-10-CM

## 2012-11-07 NOTE — Patient Instructions (Signed)
[facility] other [facility] there[facility]

## 2012-11-07 NOTE — Assessment & Plan Note (Signed)
Doing better again with sleep Still has mood issues but not MDD Not excited about meds Discussed trying her exercises from PT, [facility]

## 2012-11-07 NOTE — Progress Notes (Signed)
Subjective:    Patient ID: Chelsea Keller, female    DOB: [birth year], 78 y.o.   MRN: [id number]  HPI Headache did go away  Did finally get the temazepam Sleeping better---awakens by 10-10:30AM-----working towards earlier awakening Initiates sleep around 2:30AM Has always been a night owl Gets tired by 1-1:30AM but knows she won't go to sleep so she waits to go to bed  Winds up eating at night Weight up another 2# Some degree of boredom and depression Has busy summer coming up--- going to [employer] for granddaughter's 8th grade graduation Then has 2 weddings this summer (brother getting remarried in [place]) [place] graduating high school also  Not really exercising Trouble walking--since fall Legs and right arm are painful Tries to do some of the exercises from past PT  Current Outpatient Prescriptions on File Prior to Visit  Medication Sig Dispense Refill  . acetaminophen (TYLENOL) 500 MG tablet Take 500-1,000 mg by mouth daily as needed.        . fexofenadine (ALLEGRA) 180 MG tablet Take 180 mg by mouth daily as needed.       . furosemide (LASIX) 40 MG tablet Take 40 mg by mouth daily as needed.      . Ibuprofen (ADVIL) 200 MG CAPS Take by mouth as needed.       Marland Kitchen losartan-hydrochlorothiazide (HYZAAR) 100-25 MG per tablet Take 1 tablet by mouth daily.  90 tablet  3  . potassium chloride SA (KLOR-CON M20) 20 MEQ tablet Take 1 tablet (20 mEq total) by mouth 2 (two) times daily.  180 tablet  2  . temazepam (RESTORIL) 15 MG capsule Take 1 capsule (15 mg total) by mouth at bedtime as needed.  90 capsule  0  . traMADol (ULTRAM) 50 MG tablet Take 1 tablet (50 mg total) by mouth 3 (three) times daily as needed.  270 tablet  0  . vitamin B-12 (CYANOCOBALAMIN) 1000 MCG tablet Take 1,000 mcg by mouth daily.       No current facility-administered medications on file prior to visit.    Allergies  Allergen Reactions  . Celecoxib   . Codeine Sulfate   . Erythromycin Base     Past  Medical History  Diagnosis Date  . Left ventricular hypertrophy   . Chronic venous insufficiency   . Hypertriglyceridemia   . Hypertension   . Arthritis   . Allergy   . Impaired fasting glucose   . Hx of colonic polyps   . Gastric ulcer   . Vitamin B12 deficiency   . Sleep disorder     Past Surgical History  Procedure Laterality Date  . Cholecystectomy    . Tonsillectomy    . External fixation wrist fracture  1985    left with pins  . External fixation ankle fracture      Fx.  left ankle-fixation with pins later removed sec to infection 1985  . Cataract extraction  10/2003    OD  . Abdominal hysterectomy    . Hemiarthroplasty shoulder fracture  06/2009  . Femur fracture surgery  06/2009  . Total knee arthroplasty  03/09    left    Family History  Problem Relation Age of Onset  . Depression Mother   . Cancer Mother     uterine cancer  . Cancer Brother     prostate cancer  . Diabetes Maternal Aunt   . Arthritis Brother   . Asthma Brother     History   Social History  . Marital  Status: Widowed    Spouse Name: N/A    Number of Children: 4  . Years of Education: N/A   Occupational History  . retired crossing guard    Social History Main Topics  . Smoking status: Former Smoker    Quit date: 07/06/1993  . Smokeless tobacco: Never Used  . Alcohol Use: Yes     Comment: rare  . Drug Use: No  . Sexually Active: Not on file   Other Topics Concern  . Not on file   Social History Narrative   No living will   Plans to do health care POA forms---wants daughter Arline Asp   Would accept resuscitation attempts---no prolonged ventilation   Absolutely no feeding tube   Review of Systems Does have some depression---still misses [place] Frustrated with grandsons---some degree of anhedonia still Not clearly grieving anymore    Objective:   Physical Exam  Constitutional: She appears well-developed and well-nourished. No distress.  Psychiatric: She has a normal  mood and affect. Her behavior is normal.  Not overtly depressed          Assessment & Plan:

## 2012-12-13 ENCOUNTER — Ambulatory Visit: Payer: Medicare Other | Admitting: Internal Medicine

## 2012-12-13 ENCOUNTER — Other Ambulatory Visit: Payer: Self-pay

## 2012-12-13 MED ORDER — POTASSIUM CHLORIDE CRYS ER 20 MEQ PO TBCR: 20.0000 meq | EXTENDED_RELEASE_TABLET | Freq: Two times a day (BID) | ORAL | Status: DC

## 2012-12-13 NOTE — Telephone Encounter (Signed)
Pt left v/m requesting [facility]ill [facility];pt notified done.

## 2013-01-17 ENCOUNTER — Other Ambulatory Visit: Payer: Self-pay

## 2013-01-17 MED ORDER — TEMAZEPAM 15 MG PO CAPS: 15.0000 mg | ORAL_CAPSULE | Freq: Every evening | ORAL | Status: DC | PRN

## 2013-01-17 NOTE — Telephone Encounter (Signed)
Pt left v/m requesting [facility].Please advise.

## 2013-01-17 NOTE — Telephone Encounter (Signed)
Okay #[phone] x [phone] Will it fax there? If not, will have to print

## 2013-01-17 NOTE — Telephone Encounter (Signed)
Spoke with patient and advised rx ready for pick-up and it will be at the front desk. Yes it must be printed

## 2013-02-08 ENCOUNTER — Ambulatory Visit (INDEPENDENT_AMBULATORY_CARE_PROVIDER_SITE_OTHER): Payer: Medicare Other | Admitting: Family Medicine

## 2013-02-08 ENCOUNTER — Encounter: Payer: Self-pay | Admitting: Family Medicine

## 2013-02-08 VITALS — BP 150/70 | HR 107 | Temp 98.7°F | Wt 287.0 lb

## 2013-02-08 DIAGNOSIS — J329 Chronic sinusitis, unspecified: Secondary | ICD-10-CM | POA: Insufficient documentation

## 2013-02-08 MED ORDER — AMOXICILLIN-POT CLAVULANATE 875-125 MG PO TABS: 1.0000 | ORAL_TABLET | Freq: Two times a day (BID) | ORAL | Status: AC

## 2013-02-08 NOTE — Assessment & Plan Note (Signed)
Anticipate bacterial given duration and sxs progresion. Treat with augmentin 10d course, mucinex or guaifenesin. Update if not improving as expected.

## 2013-02-08 NOTE — Patient Instructions (Signed)
You have a sinus infection. Take medicine as prescribed: augmentin twice daily for 10 days Push fluids and plenty of rest. Nasal saline irrigation or neti pot to help drain sinuses. May use plain mucinex or immediate release guaifenesin with plenty of fluid to help mobilize mucous. Let us know if fever >101.5, trouble opening/closing mouth, difficulty swallowing, or worsening - you may need to be seen again. 

## 2013-02-08 NOTE — Progress Notes (Signed)
  Subjective:    Patient ID: Chelsea Keller, female    DOB: April 20, 1935, 78 y.o.   MRN: [id number]  HPI CC: congested, cough  1.5 wk h/o congestion/cough.  Chest discomfort with cough.  Fatigued.  Cough productive of green sputum.  Cheek pain on right.  Staying some dizziness.  Some PNDrainage.  Short winded and wheezing - attributes to weight.  Head > chest congestion.  No fevers/chills, ear or tooth pain, headache, ST.  So far has tried allergy med.  H/o allergic rhinitis. No asthma, COPD. No smokers at home.  Pt ex smoker - quit 1990s  No sick contacts at home.  Past Medical History  Diagnosis Date  . Left ventricular hypertrophy   . Chronic venous insufficiency   . Hypertriglyceridemia   . Hypertension   . Arthritis   . Allergy   . Impaired fasting glucose   . Hx of colonic polyps   . Gastric ulcer   . Vitamin B12 deficiency   . Sleep disorder      Review of Systems Per HPI    Objective:   Physical Exam  Nursing note and vitals reviewed. Constitutional: She appears well-developed and well-nourished. No distress.  Tired, nontoxic  HENT:  Head: Normocephalic and atraumatic.  Right Ear: Hearing, tympanic membrane, external ear and ear canal normal.  Left Ear: Hearing, tympanic membrane, external ear and ear canal normal.  Nose: No mucosal edema or rhinorrhea. Right sinus exhibits maxillary sinus tenderness. Right sinus exhibits no frontal sinus tenderness. Left sinus exhibits no maxillary sinus tenderness and no frontal sinus tenderness.  Mouth/Throat: Uvula is midline, oropharynx is clear and moist and mucous membranes are normal. No oropharyngeal exudate, posterior oropharyngeal edema, posterior oropharyngeal erythema or tonsillar abscesses.  Evidently congested  Eyes: Conjunctivae and EOM are normal. Pupils are equal, round, and reactive to light. No scleral icterus.  Neck: Normal range of motion. Neck supple.  Cardiovascular: Normal rate, regular rhythm and intact  distal pulses.   Murmur (2/6 SEM at LUSB) heard. Pulmonary/Chest: Effort normal and breath sounds normal. No respiratory distress. She has no wheezes. She has no rales.  Musculoskeletal: She exhibits no edema.  Lymphadenopathy:    She has cervical adenopathy (mild R AC LAD).  Skin: Skin is warm and dry. No rash noted.       Assessment & Plan:

## 2013-02-14 DIAGNOSIS — H40059 Ocular hypertension, unspecified eye: Secondary | ICD-10-CM | POA: Diagnosis not present

## 2013-02-21 ENCOUNTER — Encounter: Payer: Self-pay | Admitting: Internal Medicine

## 2013-02-21 ENCOUNTER — Encounter: Payer: Self-pay | Admitting: Radiology

## 2013-02-21 ENCOUNTER — Ambulatory Visit (INDEPENDENT_AMBULATORY_CARE_PROVIDER_SITE_OTHER): Payer: Medicare Other | Admitting: Internal Medicine

## 2013-02-21 VITALS — BP 150/60 | HR 106 | Temp 97.8°F | Wt 286.0 lb

## 2013-02-21 DIAGNOSIS — L408 Other psoriasis: Secondary | ICD-10-CM

## 2013-02-21 DIAGNOSIS — L409 Psoriasis, unspecified: Secondary | ICD-10-CM

## 2013-02-21 MED ORDER — TRIAMCINOLONE ACETONIDE 0.1 % EX CREA: TOPICAL_CREAM | Freq: Two times a day (BID) | CUTANEOUS | Status: AC

## 2013-02-21 NOTE — Patient Instructions (Signed)
Please use a moisturizing soap instead of the Safeguard. Use a lotion like cerave on your skin after bathing. Use the triamcinolone for healing and to control the itch. If the rash is not better in the next few weeks, set up an appointment with Dr Roseanne Kaufman (or [facility]) in [place] or call for a referral to another dermatologist.

## 2013-02-21 NOTE — Assessment & Plan Note (Signed)
With secondary neurodermatitis Has appearance that could be scabies but distribution suggests against  Please use moisturizing soap Cerave or other lotion after Triamcinolone cream To derm if can't control

## 2013-02-21 NOTE — Progress Notes (Signed)
Subjective:    Patient ID: Chelsea Keller, female    DOB: 03-Oct-1934, 78 y.o.   MRN: [id number]  HPI Has had a rash on her arms for a couple of weeks Started on left, then spread to right Intensely itchy  Doesn't use moisturizers on skin Safeguard soap  Some psoriasis on legs Has behind ears and in scalp  Current Outpatient Prescriptions on File Prior to Visit  Medication Sig Dispense Refill  . acetaminophen (TYLENOL) 500 MG tablet Take 500-1,000 mg by mouth daily as needed.        . fexofenadine (ALLEGRA) 180 MG tablet Take 180 mg by mouth daily as needed.       . furosemide (LASIX) 40 MG tablet Take 40 mg by mouth daily as needed.      . Ibuprofen (ADVIL) 200 MG CAPS Take by mouth as needed.       Marland Kitchen losartan-hydrochlorothiazide (HYZAAR) 100-25 MG per tablet Take 1 tablet by mouth daily.  90 tablet  3  . potassium chloride SA (KLOR-CON M20) 20 MEQ tablet Take 1 tablet (20 mEq total) by mouth 2 (two) times daily.  180 tablet  1  . temazepam (RESTORIL) 15 MG capsule Take 1 capsule (15 mg total) by mouth at bedtime as needed.  90 capsule  0  . traMADol (ULTRAM) 50 MG tablet Take 1 tablet (50 mg total) by mouth 3 (three) times daily as needed.  270 tablet  0  . vitamin B-12 (CYANOCOBALAMIN) 1000 MCG tablet Take 1,000 mcg by mouth daily.       No current facility-administered medications on file prior to visit.    Allergies  Allergen Reactions  . Celecoxib   . Codeine Sulfate   . Erythromycin Base     Past Medical History  Diagnosis Date  . Left ventricular hypertrophy   . Chronic venous insufficiency   . Hypertriglyceridemia   . Hypertension   . Arthritis   . Allergy   . Impaired fasting glucose   . Hx of colonic polyps   . Gastric ulcer   . Vitamin B12 deficiency   . Sleep disorder     Past Surgical History  Procedure Laterality Date  . Cholecystectomy    . Tonsillectomy    . External fixation wrist fracture  1985    left with pins  . External fixation ankle  fracture      Fx.  left ankle-fixation with pins later removed sec to infection 1985  . Cataract extraction  10/2003    OD  . Abdominal hysterectomy    . Hemiarthroplasty shoulder fracture  06/2009  . Femur fracture surgery  06/2009  . Total knee arthroplasty  03/09    left    Family History  Problem Relation Age of Onset  . Depression Mother   . Cancer Mother     uterine cancer  . Cancer Brother     prostate cancer  . Diabetes Maternal Aunt   . Arthritis Brother   . Asthma Brother     History   Social History  . Marital Status: Widowed    Spouse Name: N/A    Number of Children: 4  . Years of Education: N/A   Occupational History  . retired crossing guard    Social History Main Topics  . Smoking status: Former Smoker    Quit date: 07/06/1993  . Smokeless tobacco: Never Used  . Alcohol Use: Yes     Comment: rare  . Drug Use: No  .  Sexual Activity: Not on file   Other Topics Concern  . Not on file   Social History Narrative   No living will   Plans to do health care POA forms---wants daughter Arline Asp   Would accept resuscitation attempts---no prolonged ventilation   Absolutely no feeding tube   Review of Systems No fever No sick    Objective:   Physical Exam  Constitutional: She appears well-developed and well-nourished. No distress.  Skin:  Dry skin and excoriations on both forearms Slight excoriations on right hip Psoriatic plaques--mostly right leg          Assessment & Plan:

## 2013-02-24 ENCOUNTER — Ambulatory Visit: Payer: Medicare Other | Admitting: Internal Medicine

## 2013-03-10 ENCOUNTER — Ambulatory Visit (INDEPENDENT_AMBULATORY_CARE_PROVIDER_SITE_OTHER): Payer: Medicare Other | Admitting: Internal Medicine

## 2013-03-10 ENCOUNTER — Encounter: Payer: Self-pay | Admitting: Internal Medicine

## 2013-03-10 VITALS — BP 122/64 | HR 85 | Temp 98.0°F | Wt 283.0 lb

## 2013-03-10 DIAGNOSIS — J309 Allergic rhinitis, unspecified: Secondary | ICD-10-CM

## 2013-03-10 DIAGNOSIS — G479 Sleep disorder, unspecified: Secondary | ICD-10-CM

## 2013-03-10 DIAGNOSIS — M159 Polyosteoarthritis, unspecified: Secondary | ICD-10-CM

## 2013-03-10 DIAGNOSIS — I10 Essential (primary) hypertension: Secondary | ICD-10-CM | POA: Diagnosis not present

## 2013-03-10 DIAGNOSIS — Z6841 Body Mass Index (BMI) 40.0 and over, adult: Secondary | ICD-10-CM

## 2013-03-10 MED ORDER — TRIAMTERENE-HCTZ 37.5-25 MG PO TABS: 1.0000 | ORAL_TABLET | Freq: Every day | ORAL | Status: DC

## 2013-03-10 NOTE — Assessment & Plan Note (Signed)
Pain seems to her major issue She is going to go back to her orthopedist We want to avoid escalating narcotics at this point

## 2013-03-10 NOTE — Assessment & Plan Note (Signed)
Doing a little better with the temazepam

## 2013-03-10 NOTE — Assessment & Plan Note (Signed)
BP Readings from Last 3 Encounters:  03/10/13 122/64  02/21/13 150/60  02/08/13 150/70   Better May be having problems with the losartan Will try back on the dyazide

## 2013-03-10 NOTE — Progress Notes (Signed)
Subjective:    Patient ID: Chelsea Keller, female    DOB: 1934-10-21, 78 y.o.   MRN: [id number]  HPI Skin is not better She plans to call Dr Roseanne Kaufman for dermatology  Still never feels good Pain, allergies acting up, etc Thinks she needs to go back to Dr Marcello Fennel the orthopedist Hips and back hurt also Trouble walkiing Tramadol not helping pain--uses with tylenol at night  Still with DOE---relates to her obesity  Does sleep with a tramadol and temazepam (for 3 hours or so) Lots of daytime somnolence  Hears thump in her ears Concerned about the BP med---started after the losartan Wonders if hyzaar is causing the rash  Current Outpatient Prescriptions on File Prior to Visit  Medication Sig Dispense Refill  . acetaminophen (TYLENOL) 500 MG tablet Take 500-1,000 mg by mouth daily as needed.        . fexofenadine (ALLEGRA) 180 MG tablet Take 180 mg by mouth daily as needed.       . furosemide (LASIX) 40 MG tablet Take 40 mg by mouth daily as needed.      . Ibuprofen (ADVIL) 200 MG CAPS Take by mouth as needed.       . latanoprost (XALATAN) 0.005 % ophthalmic solution Place 1 drop into the right eye at bedtime.       Marland Kitchen losartan-hydrochlorothiazide (HYZAAR) 100-25 MG per tablet Take 1 tablet by mouth daily.  90 tablet  3  . potassium chloride SA (KLOR-CON M20) 20 MEQ tablet Take 1 tablet (20 mEq total) by mouth 2 (two) times daily.  180 tablet  1  . temazepam (RESTORIL) 15 MG capsule Take 1 capsule (15 mg total) by mouth at bedtime as needed.  90 capsule  0  . traMADol (ULTRAM) 50 MG tablet Take 1 tablet (50 mg total) by mouth 3 (three) times daily as needed.  270 tablet  0  . triamcinolone cream (KENALOG) 0.1 % Apply topically 2 (two) times daily.  45 g  3  . vitamin B-12 (CYANOCOBALAMIN) 1000 MCG tablet Take 1,000 mcg by mouth daily.       No current facility-administered medications on file prior to visit.    Allergies  Allergen Reactions  . Celecoxib   . Codeine Sulfate   .  Erythromycin Base     Past Medical History  Diagnosis Date  . Left ventricular hypertrophy   . Chronic venous insufficiency   . Hypertriglyceridemia   . Hypertension   . Arthritis   . Allergy   . Impaired fasting glucose   . Hx of colonic polyps   . Gastric ulcer   . Vitamin B12 deficiency   . Sleep disorder     Past Surgical History  Procedure Laterality Date  . Cholecystectomy    . Tonsillectomy    . External fixation wrist fracture  1985    left with pins  . External fixation ankle fracture      Fx.  left ankle-fixation with pins later removed sec to infection 1985  . Cataract extraction  10/2003    OD  . Abdominal hysterectomy    . Hemiarthroplasty shoulder fracture  06/2009  . Femur fracture surgery  06/2009  . Total knee arthroplasty  03/09    left    Family History  Problem Relation Age of Onset  . Depression Mother   . Cancer Mother     uterine cancer  . Cancer Brother     prostate cancer  . Diabetes Maternal Aunt   .  Arthritis Brother   . Asthma Brother     History   Social History  . Marital Status: Widowed    Spouse Name: N/A    Number of Children: 4  . Years of Education: N/A   Occupational History  . retired crossing guard    Social History Main Topics  . Smoking status: Former Smoker    Quit date: 07/06/1993  . Smokeless tobacco: Never Used  . Alcohol Use: Yes     Comment: rare  . Drug Use: No  . Sexual Activity: Not on file   Other Topics Concern  . Not on file   Social History Narrative   No living will   Plans to do health care POA forms---wants daughter Arline Asp   Would accept resuscitation attempts---no prolonged ventilation   Absolutely no feeding tube   Review of Systems Can't sleep on right side due to her right shoulder Trouble doing any exercise---not really trying Appetite is okay    Objective:   Physical Exam  Constitutional: She appears well-developed. No distress.  Neck: Normal range of motion. Neck supple.   Cardiovascular: Normal rate, regular rhythm and normal heart sounds.  Exam reveals no gallop.   No murmur heard. Pulmonary/Chest: Effort normal and breath sounds normal. No respiratory distress. She has no wheezes. She has no rales.  Abdominal: Soft. There is no tenderness.  Musculoskeletal: She exhibits edema.  2+ reedy edema in both calves  Lymphadenopathy:    She has no cervical adenopathy.  Psychiatric:  Mildly depressed and frustrated          Assessment & Plan:

## 2013-03-10 NOTE — Assessment & Plan Note (Signed)
Feels this is the cause of her DOE Nothing to clearly indicated CAD

## 2013-03-10 NOTE — Assessment & Plan Note (Signed)
Needs to go back on the fexofenadine

## 2013-03-22 DIAGNOSIS — M19019 Primary osteoarthritis, unspecified shoulder: Secondary | ICD-10-CM | POA: Diagnosis not present

## 2013-03-22 DIAGNOSIS — M12519 Traumatic arthropathy, unspecified shoulder: Secondary | ICD-10-CM | POA: Diagnosis not present

## 2013-03-22 DIAGNOSIS — M25569 Pain in unspecified knee: Secondary | ICD-10-CM | POA: Diagnosis not present

## 2013-04-11 ENCOUNTER — Other Ambulatory Visit: Payer: Self-pay

## 2013-04-11 NOTE — Telephone Encounter (Signed)
Pt left v/m requesting rx for temazepam and tramadol. Pt request cb when rx ready for pick up.

## 2013-04-12 DIAGNOSIS — H40009 Preglaucoma, unspecified, unspecified eye: Secondary | ICD-10-CM | POA: Diagnosis not present

## 2013-04-12 MED ORDER — TRAMADOL HCL 50 MG PO TABS: 50.0000 mg | ORAL_TABLET | Freq: Three times a day (TID) | ORAL | Status: DC | PRN

## 2013-04-12 MED ORDER — TEMAZEPAM 15 MG PO CAPS: 15.0000 mg | ORAL_CAPSULE | Freq: Every evening | ORAL | Status: DC | PRN

## 2013-04-12 NOTE — Telephone Encounter (Signed)
Spoke with patient and advised rx ready for pick-up and it will be at the front desk.  

## 2013-04-13 ENCOUNTER — Other Ambulatory Visit: Payer: Self-pay | Admitting: Orthopedic Surgery

## 2013-04-13 ENCOUNTER — Ambulatory Visit
Admission: RE | Admit: 2013-04-13 | Discharge: 2013-04-13 | Disposition: A | Discharge status: Home / Self Care | Payer: Medicare Other | Source: Ambulatory Visit | Attending: Orthopedic Surgery | Admitting: Orthopedic Surgery

## 2013-04-13 DIAGNOSIS — R222 Localized swelling, mass and lump, trunk: Secondary | ICD-10-CM

## 2013-05-08 ENCOUNTER — Encounter: Payer: Self-pay | Admitting: Internal Medicine

## 2013-05-08 ENCOUNTER — Ambulatory Visit (INDEPENDENT_AMBULATORY_CARE_PROVIDER_SITE_OTHER): Payer: Medicare Other | Admitting: Internal Medicine

## 2013-05-08 VITALS — BP 140/80 | HR 90 | Temp 97.5°F | Wt 285.0 lb

## 2013-05-08 DIAGNOSIS — R42 Dizziness and giddiness: Secondary | ICD-10-CM

## 2013-05-08 NOTE — Progress Notes (Signed)
Subjective:    Patient ID: Chelsea Keller, female    DOB: 1934-12-03, 78 y.o.   MRN: [id number]  HPI Here with daughter  Feels lightheaded---ongoing and "chalks it up to the allergies" When in [employer] this weekend--- "felt like I was sitting in a black hole"--everything was black for a split second Able to sit down and it passed Was leaning on cart and then stood up straight Had been up and shopping for some time Better after eating----hadn't eaten all day (this was at 6PM--though she usually doesn't get up for good until 3PM)  Right side of face feels funny--goes back months  Ongoing headaches-- not new. Like a dull ache Legs are weakness---more obvious on left but fairly long standing No trouble speaking  No chest pain Can feel her pulse in ears----going on for a long time (not fast) Same edema without change  Current Outpatient Prescriptions on File Prior to Visit  Medication Sig Dispense Refill  . acetaminophen (TYLENOL) 500 MG tablet Take 500-1,000 mg by mouth daily as needed.        . latanoprost (XALATAN) 0.005 % ophthalmic solution Place 1 drop into the right eye at bedtime.       . potassium chloride SA (KLOR-CON M20) 20 MEQ tablet Take 1 tablet (20 mEq total) by mouth 2 (two) times daily.  180 tablet  1  . temazepam (RESTORIL) 15 MG capsule Take 1 capsule (15 mg total) by mouth at bedtime as needed.  90 capsule  0  . traMADol (ULTRAM) 50 MG tablet Take 1 tablet (50 mg total) by mouth 3 (three) times daily as needed.  270 tablet  0  . triamcinolone cream (KENALOG) 0.1 % Apply topically 2 (two) times daily.  45 g  3  . triamterene-hydrochlorothiazide (MAXZIDE-25) 37.5-25 MG per tablet Take 1 tablet by mouth daily.  90 tablet  3  . vitamin B-12 (CYANOCOBALAMIN) 1000 MCG tablet Take 1,000 mcg by mouth daily.       No current facility-administered medications on file prior to visit.    Allergies  Allergen Reactions  . Celecoxib   . Codeine Sulfate   . Erythromycin Base      Past Medical History  Diagnosis Date  . Left ventricular hypertrophy   . Chronic venous insufficiency   . Hypertriglyceridemia   . Hypertension   . Arthritis   . Allergy   . Impaired fasting glucose   . Hx of colonic polyps   . Gastric ulcer   . Vitamin B12 deficiency   . Sleep disorder     Past Surgical History  Procedure Laterality Date  . Cholecystectomy    . Tonsillectomy    . External fixation wrist fracture  1985    left with pins  . External fixation ankle fracture      Fx.  left ankle-fixation with pins later removed sec to infection 1985  . Cataract extraction  10/2003    OD  . Abdominal hysterectomy    . Hemiarthroplasty shoulder fracture  06/2009  . Femur fracture surgery  06/2009  . Total knee arthroplasty  03/09    left    Family History  Problem Relation Age of Onset  . Depression Mother   . Cancer Mother     uterine cancer  . Cancer Brother     prostate cancer  . Diabetes Maternal Aunt   . Arthritis Brother   . Asthma Brother     History   Social History  . Marital  Status: Widowed    Spouse Name: N/A    Number of Children: 4  . Years of Education: N/A   Occupational History  . retired crossing guard    Social History Main Topics  . Smoking status: Former Smoker    Quit date: 07/06/1993  . Smokeless tobacco: Never Used  . Alcohol Use: Yes     Comment: rare  . Drug Use: No  . Sexual Activity: Not on file   Other Topics Concern  . Not on file   Social History Narrative   No living will   Plans to do health care POA forms---wants daughter Arline Asp   Would accept resuscitation attempts---no prolonged ventilation   Absolutely no feeding tube   Review of Systems Occasional gets spinning sensation--not during the episode Still not sleeping well--just can't get to sleep. Then up at 8AM briefly, then back to sleep Recent good time in [place] and visiting daughter in [place]    Objective:   Physical Exam  Constitutional:  She appears well-developed and well-nourished. No distress.  HENT:  Mouth/Throat: Oropharynx is clear and moist. No oropharyngeal exudate.  Eyes: Conjunctivae and EOM are normal. Pupils are equal, round, and reactive to light.  Neck: Normal range of motion. Neck supple. No thyromegaly present.  Cardiovascular: Normal rate, regular rhythm and normal heart sounds.  Exam reveals no gallop.   No murmur heard. Pulmonary/Chest: Effort normal and breath sounds normal. No respiratory distress. She has no wheezes. She has no rales.  Musculoskeletal: She exhibits edema.  2+ firm edema  Lymphadenopathy:    She has no cervical adenopathy.  Neurological: No cranial nerve deficit. She exhibits normal muscle tone. Coordination normal.  No focal weakness  Psychiatric: She has a normal mood and affect. Her behavior is normal.          Assessment & Plan:

## 2013-05-08 NOTE — Assessment & Plan Note (Signed)
Doesn't sound like TIA or neurologic event Nothing to suggest orthostasis or coronary ischemia Probably just related to not having eaten  Reassured about the benign findings Will observe only

## 2013-05-11 ENCOUNTER — Other Ambulatory Visit: Payer: Self-pay

## 2013-05-19 ENCOUNTER — Other Ambulatory Visit: Payer: Self-pay | Admitting: Internal Medicine

## 2013-06-13 ENCOUNTER — Ambulatory Visit: Payer: Medicare Other | Admitting: Internal Medicine

## 2013-06-30 ENCOUNTER — Other Ambulatory Visit: Payer: Self-pay

## 2013-06-30 MED ORDER — TEMAZEPAM 15 MG PO CAPS: 15.0000 mg | ORAL_CAPSULE | Freq: Every evening | ORAL | Status: DC | PRN

## 2013-06-30 NOTE — Telephone Encounter (Signed)
Spoke with patient and advised rx ready for pick-up and it will be at the front desk.  

## 2013-06-30 NOTE — Telephone Encounter (Signed)
Pt left v/m requesting written rx for temazepam. Call when ready for pick up.

## 2013-06-30 NOTE — Telephone Encounter (Signed)
Okay #90 x 0 

## 2013-08-24 ENCOUNTER — Other Ambulatory Visit: Payer: Self-pay

## 2013-08-24 NOTE — Telephone Encounter (Signed)
Okay #270 x 0 Can't be sent electronically ---does she want to mail it or does she have a form for [place] to fax it?

## 2013-08-24 NOTE — Telephone Encounter (Signed)
Pt left v/m [facility].Please advise. Pt request cb when refilled.

## 2013-08-25 MED ORDER — TRAMADOL HCL 50 MG PO TABS: 50.0000 mg | ORAL_TABLET | Freq: Three times a day (TID) | ORAL | Status: DC | PRN

## 2013-08-25 NOTE — Telephone Encounter (Signed)
Status?

## 2013-08-25 NOTE — Telephone Encounter (Signed)
Spoke with patient and advised rx ready for pick-up and it will be at the front desk.  

## 2013-08-28 ENCOUNTER — Encounter: Payer: Self-pay | Admitting: Internal Medicine

## 2013-09-04 ENCOUNTER — Encounter: Payer: Self-pay | Admitting: Internal Medicine

## 2013-09-04 ENCOUNTER — Ambulatory Visit (INDEPENDENT_AMBULATORY_CARE_PROVIDER_SITE_OTHER): Payer: Medicare Other | Admitting: Internal Medicine

## 2013-09-04 VITALS — BP 138/70 | HR 79 | Temp 98.5°F | Ht 62.0 in | Wt 279.0 lb

## 2013-09-04 DIAGNOSIS — R7301 Impaired fasting glucose: Secondary | ICD-10-CM

## 2013-09-04 DIAGNOSIS — Z0000 Encounter for general adult medical examination without abnormal findings: Secondary | ICD-10-CM

## 2013-09-04 DIAGNOSIS — E781 Pure hyperglyceridemia: Secondary | ICD-10-CM | POA: Diagnosis not present

## 2013-09-04 DIAGNOSIS — R42 Dizziness and giddiness: Secondary | ICD-10-CM | POA: Diagnosis not present

## 2013-09-04 DIAGNOSIS — Z79899 Other long term (current) drug therapy: Secondary | ICD-10-CM | POA: Diagnosis not present

## 2013-09-04 DIAGNOSIS — M159 Polyosteoarthritis, unspecified: Secondary | ICD-10-CM | POA: Diagnosis not present

## 2013-09-04 DIAGNOSIS — G479 Sleep disorder, unspecified: Secondary | ICD-10-CM

## 2013-09-04 DIAGNOSIS — I10 Essential (primary) hypertension: Secondary | ICD-10-CM

## 2013-09-04 LAB — COMPREHENSIVE METABOLIC PANEL
ALT: 13 U/L (ref 0–35)
AST: 19 U/L (ref 0–37)
Albumin: 3.6 g/dL (ref 3.5–5.2)
Alkaline Phosphatase: 68 U/L (ref 39–117)
BUN: 14 mg/dL (ref 6–23)
CHLORIDE: 103 meq/L (ref 96–112)
CO2: 29 mEq/L (ref 19–32)
Calcium: 9.2 mg/dL (ref 8.4–10.5)
Creatinine, Ser: 0.8 mg/dL (ref 0.4–1.2)
GFR: 71.88 mL/min (ref >60.00)
Glucose, Bld: 96 mg/dL (ref 70–99)
Potassium: 3.8 mEq/L (ref 3.5–5.1)
SODIUM: 140 meq/L (ref 135–145)
TOTAL PROTEIN: 6.6 g/dL (ref 6.0–8.3)
Total Bilirubin: 0.8 mg/dL (ref 0.3–1.2)

## 2013-09-04 LAB — LIPID PANEL
Cholesterol: 166 mg/dL (ref 0–200)
HDL: 46.1 mg/dL (ref >39.00)
LDL Cholesterol: 91 mg/dL (ref 0–99)
Total CHOL/HDL Ratio: 4
Triglycerides: 145 mg/dL (ref 0.0–149.0)
VLDL: 29 mg/dL (ref 0.0–40.0)

## 2013-09-04 LAB — CBC WITH DIFFERENTIAL/PLATELET
Basophils Absolute: 0.1 10*3/uL (ref 0.0–0.1)
Basophils Relative: 0.6 % (ref 0.0–3.0)
EOS ABS: 0.5 10*3/uL (ref 0.0–0.7)
Eosinophils Relative: 3.9 % (ref 0.0–5.0)
HEMATOCRIT: 34.8 % — AB (ref 36.0–46.0)
Hemoglobin: 10.8 g/dL — ABNORMAL LOW (ref 12.0–15.0)
LYMPHS ABS: 3.1 10*3/uL (ref 0.7–4.0)
LYMPHS PCT: 26.4 % (ref 12.0–46.0)
MCHC: 31.1 g/dL (ref 30.0–36.0)
MCV: 83.1 fl (ref 78.0–100.0)
MONOS PCT: 6.8 % (ref 3.0–12.0)
Monocytes Absolute: 0.8 10*3/uL (ref 0.1–1.0)
Neutro Abs: 7.3 10*3/uL (ref 1.4–7.7)
Neutrophils Relative %: 62.3 % (ref 43.0–77.0)
PLATELETS: 359 10*3/uL (ref 150.0–400.0)
RBC: 4.19 Mil/uL (ref 3.87–5.11)
RDW: 16.3 % — ABNORMAL HIGH (ref 11.5–14.6)
WBC: 11.7 10*3/uL — ABNORMAL HIGH (ref 4.5–10.5)

## 2013-09-04 LAB — HEMOGLOBIN A1C: Hgb A1c MFr Bld: 6.1 % (ref 4.6–6.5)

## 2013-09-04 LAB — TSH: TSH: 2.6 u[IU]/mL (ref 0.35–5.50)

## 2013-09-04 LAB — T4, FREE: FREE T4: 0.7 ng/dL (ref 0.60–1.60)

## 2013-09-04 MED ORDER — TEMAZEPAM 15 MG PO CAPS: 15.0000 mg | ORAL_CAPSULE | Freq: Every evening | ORAL | Status: DC | PRN

## 2013-09-04 NOTE — Progress Notes (Signed)
Subjective:    Patient ID: Chelsea Keller, female    DOB: 1935/01/31, 79 y.o.   MRN: [id number]  HPI Here for Medicare wellness and follow up Didn't do the form Sees Dr Chelsea Keller for her eyes, Dr Chelsea Keller (orthopedist) due to leg problems, and is due to see Dr Chelsea Keller Has occasional depressed mood--mostly from limitations with finances and physical restrictions. 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 Rare alcohol No exercise---having trouble even walking so going to ortho Still independent with ADLs---she helps her daughter out a little with household tasks Has trouble with vision on right--- ?eyelash, glaucoma Hearing is fine No apparent cognitive issues of note Reviewed advanced directives, meds, etc Requests no immunizations or cancer screening  Concerned about the controlled substance agreement Has been out of the tramadol Ongoing pain and does use it when she has it Has been increasing her dose of the temazepam---taking 2 a night at times Discussed my concerns about this Can just use tylenol extra strength as well---encouraged this  Has had some chest pain Feels on the surface just at left costal margin at T5 or so No palpitations--but can feel pulse in ears Some breathing problems---relates to nasal blockage Limited exercise tolerance but no change Still gets dizziness--describes rotatory vertigo (has some ear problems)  Uses the tramadol every night Also sometimes in the day---mostly knee pain  Current Outpatient Prescriptions on File Prior to Visit  Medication Sig Dispense Refill  . acetaminophen (TYLENOL) 500 MG tablet Take 500-1,000 mg by mouth daily as needed.        Marland Kitchen KLOR-CON M20 20 MEQ tablet TAKE 1 TABLET TWICE A DAY  180 tablet  3  . latanoprost (XALATAN) 0.005 % ophthalmic solution Place 1 drop into the right eye at bedtime.       . temazepam (RESTORIL) 15 MG capsule Take 1 capsule (15 mg total) by mouth at bedtime as  needed.  90 capsule  0  . traMADol (ULTRAM) 50 MG tablet Take 1 tablet (50 mg total) by mouth 3 (three) times daily as needed.  270 tablet  0  . triamcinolone cream (KENALOG) 0.1 % Apply topically 2 (two) times daily.  45 g  3  . triamterene-hydrochlorothiazide (MAXZIDE-25) 37.5-25 MG per tablet Take 1 tablet by mouth daily.  90 tablet  3  . vitamin B-12 (CYANOCOBALAMIN) 1000 MCG tablet Take 1,000 mcg by mouth daily.       No current facility-administered medications on file prior to visit.    Allergies  Allergen Reactions  . Celecoxib   . Codeine Sulfate   . Erythromycin Base     Past Medical History  Diagnosis Date  . Left ventricular hypertrophy   . Chronic venous insufficiency   . Hypertriglyceridemia   . Hypertension   . Arthritis   . Allergy   . Impaired fasting glucose   . Hx of colonic polyps   . Gastric ulcer   . Vitamin B12 deficiency   . Sleep disorder     Past Surgical History  Procedure Laterality Date  . Cholecystectomy    . Tonsillectomy    . External fixation wrist fracture  1985    left with pins  . External fixation ankle fracture      Fx.  left ankle-fixation with pins later removed sec to infection 1985  . Cataract extraction  10/2003    OD  . Abdominal hysterectomy    . Hemiarthroplasty shoulder fracture  06/2009  .  Femur fracture surgery  06/2009  . Total knee arthroplasty  03/09    left    Family History  Problem Relation Age of Onset  . Depression Mother   . Cancer Mother     uterine cancer  . Cancer Brother     prostate cancer  . Diabetes Maternal Aunt   . Arthritis Brother   . Asthma Brother     History   Social History  . Marital Status: Widowed    Spouse Name: N/A    Number of Children: 4  . 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 Smoking status: Former Smoker    Quit date: 07/06/1993  . Smokeless tobacco: Never  Used  . Alcohol Use: Yes     Comment: rare  . Drug Use: No  . Sexual Activity: Not on file   Other Topics Concern  . Not on file   Social History Narrative   No living will   Plans to do health care POA forms---wants daughter Chelsea Keller   Would accept resuscitation attempts---no prolonged ventilation   Absolutely no feeding tube   Review of Systems Still with sleep problems Voids okay No trouble with bowels Appetite is fine Weight is down a few pounds    Objective:   Physical Exam  Constitutional: She is oriented to person, place, and time. She appears well-developed. No distress.  HENT:  Mouth/Throat: Oropharynx is clear and moist. No oropharyngeal exudate.  Eyes: Conjunctivae are normal. Pupils are equal, round, and reactive to light.  Neck: Normal range of motion. Neck supple. No thyromegaly present.  Cardiovascular: Normal rate, regular rhythm, normal heart sounds and intact distal pulses.  Exam reveals no gallop.   No murmur heard. Pulmonary/Chest: Effort normal and breath sounds normal. No respiratory distress. She has no wheezes. She has no rales. She exhibits tenderness.  Tender ~T5 at costal margin  Abdominal: Soft. There is no tenderness.  Musculoskeletal:  Thick calves without pitting  Lymphadenopathy:    She has no cervical adenopathy.  Neurological: She is alert and oriented to person, place, and time.  President-- "Ramonita Lab, [place]" [phone] [id number] Recall 3/3  Skin:  Scattered psoriatic plaques--esp on legs  Psychiatric: She has a normal mood and affect. Her behavior is normal.          Assessment & Plan:

## 2013-09-04 NOTE — Assessment & Plan Note (Signed)
Mostly her knees Uses the tramadol Seeing Dr Ginette Pitman

## 2013-09-04 NOTE — Assessment & Plan Note (Signed)
I have personally reviewed the Medicare Annual Wellness questionnaire and have noted 1. The patient's medical and social history 2. Their use of alcohol, tobacco or illicit drugs 3. Their current medications and supplements 4. The patient's functional ability including ADL's, fall risks, home safety risks and hearing or visual             impairment. 5. Diet and physical activities 6. Evidence for depression or mood disorders  The patients weight, height, BMI and visual acuity have been recorded in the chart I have made referrals, counseling and provided education to the patient based review of the above and I have provided the pt with a written personalized care plan for preventive services.  I have provided you with a copy of your personalized plan for preventive services. Please take the time to review along with your updated medication list.  Prefers no preventative care

## 2013-09-04 NOTE — Assessment & Plan Note (Signed)
Urged her to limit the temazepam use

## 2013-09-04 NOTE — Assessment & Plan Note (Signed)
Sounds like vertigo Does have cardiology eval coming up though

## 2013-09-04 NOTE — Assessment & Plan Note (Signed)
BP Readings from Last 3 Encounters:  09/04/13 138/70  05/08/13 140/80  03/10/13 122/64   Good control No changes needed

## 2013-09-04 NOTE — Progress Notes (Signed)
Pre visit review using our clinic review tool, if applicable. No additional management support is needed unless otherwise documented below in the visit note. 

## 2013-09-05 ENCOUNTER — Telehealth: Payer: Self-pay | Admitting: Internal Medicine

## 2013-09-05 DIAGNOSIS — H40009 Preglaucoma, unspecified, unspecified eye: Secondary | ICD-10-CM | POA: Diagnosis not present

## 2013-09-05 NOTE — Telephone Encounter (Signed)
Relevant patient education assigned to patient using Emmi. ° °

## 2013-09-06 DIAGNOSIS — M25569 Pain in unspecified knee: Secondary | ICD-10-CM | POA: Diagnosis not present

## 2013-09-06 DIAGNOSIS — T847XXA Infection and inflammatory reaction due to other internal orthopedic prosthetic devices, implants and grafts, initial encounter: Secondary | ICD-10-CM | POA: Diagnosis not present

## 2013-09-09 DIAGNOSIS — H169 Unspecified keratitis: Secondary | ICD-10-CM | POA: Diagnosis not present

## 2013-09-11 ENCOUNTER — Encounter: Payer: Medicare Other | Admitting: Internal Medicine

## 2013-09-20 ENCOUNTER — Encounter: Payer: Self-pay | Admitting: Internal Medicine

## 2013-09-25 ENCOUNTER — Encounter (HOSPITAL_COMMUNITY): Payer: Self-pay | Admitting: Pharmacy Technician

## 2013-09-26 NOTE — Pre-Procedure Instructions (Signed)
Chelsea Keller  09/26/2013   Your procedure is scheduled on:  Thurs, April 2 @ 8:00 AM  Report to [facility]  at 6:00 AM.  Call this number if you have problems the morning of surgery: [phone]   Remember:   Do not eat food or drink liquids after midnight.   Take these medicines the morning of surgery with A SIP OF WATER: Tramadol(Ultram-if needed)               No Goody's,BC's,Aleve,Aspirin,Ibuprofen,Fish Oil,or any Herbal Medications   Do not wear jewelry, make-up or nail polish.  Do not wear lotions, powders, or perfumes. You may wear deodorant.  Do not shave 48 hours prior to surgery.   Do not bring valuables to the hospital.  [facility] is not responsible                  for any belongings or valuables.               Contacts, dentures or bridgework may not be worn into surgery.  Leave suitcase in the car. After surgery it may be brought to your room.  For patients admitted to the hospital, discharge time is determined by your                treatment team.               Patients discharged the day of surgery will not be allowed to drive  home.    Special Instructions:  [facility] - Preparing for Surgery  Before surgery, you can play an important role.  Because skin is not sterile, your skin needs to be as free of germs as possible.  You can reduce the number of germs on you skin by washing with CHG (chlorahexidine gluconate) soap before surgery.  CHG is an antiseptic cleaner which kills germs and bonds with the skin to continue killing germs even after washing.  Please DO NOT use if you have an allergy to CHG or antibacterial soaps.  If your skin becomes reddened/irritated stop using the CHG and inform your nurse when you arrive at Short Stay.  Do not shave (including legs and underarms) for at least 48 hours prior to the first CHG shower.  You may shave your face.  Please follow these instructions carefully:   1.  Shower with CHG Soap the night before  surgery and the                                morning of Surgery.  2.  If you choose to wash your hair, wash your hair first as usual with your       normal shampoo.  3.  After you shampoo, rinse your hair and body thoroughly to remove the                      Shampoo.  4.  Use CHG as you would any other liquid soap.  You can apply chg directly       to the skin and wash gently with scrungie or a clean washcloth.  5.  Apply the CHG Soap to your body ONLY FROM THE NECK DOWN.        Do not use on open wounds or open sores.  Avoid contact with your eyes,       ears, mouth and genitals (private parts).  Wash genitals (private parts)       with your normal soap.  6.  Wash thoroughly, paying special attention to the area where your surgery        will be performed.  7.  Thoroughly rinse your body with warm water from the neck down.  8.  DO NOT shower/wash with your normal soap after using and rinsing off       the CHG Soap.  9.  Pat yourself dry with a clean towel.            10.  Wear clean pajamas.            11.  Place clean sheets on your bed the night of your first shower and do not        sleep with pets.  Day of Surgery  Do not apply any lotions/deoderants the morning of surgery.  Please wear clean clothes to the hospital/surgery center.     Please read over the following fact sheets that you were given: Pain Booklet, Coughing and Deep Breathing and Surgical Site Infection Prevention

## 2013-09-27 ENCOUNTER — Encounter (HOSPITAL_COMMUNITY): Payer: Self-pay

## 2013-09-27 ENCOUNTER — Encounter (HOSPITAL_COMMUNITY)
Admission: RE | Admit: 2013-09-27 | Discharge: 2013-09-27 | Disposition: A | Discharge status: Home / Self Care | Payer: Medicare Other | Source: Ambulatory Visit | Attending: Orthopedic Surgery | Admitting: Orthopedic Surgery

## 2013-09-27 ENCOUNTER — Encounter (HOSPITAL_COMMUNITY)
Admission: RE | Admit: 2013-09-27 | Discharge: 2013-09-27 | Disposition: A | Discharge status: Home / Self Care | Payer: Medicare Other | Source: Ambulatory Visit | Attending: Anesthesiology | Admitting: Anesthesiology

## 2013-09-27 DIAGNOSIS — Z01812 Encounter for preprocedural laboratory examination: Secondary | ICD-10-CM | POA: Insufficient documentation

## 2013-09-27 DIAGNOSIS — Z01810 Encounter for preprocedural cardiovascular examination: Secondary | ICD-10-CM | POA: Diagnosis not present

## 2013-09-27 DIAGNOSIS — Z01818 Encounter for other preprocedural examination: Secondary | ICD-10-CM | POA: Insufficient documentation

## 2013-09-27 HISTORY — DX: Fibromyalgia: M79.7

## 2013-09-27 HISTORY — DX: Other specified postprocedural states: Z98.890

## 2013-09-27 HISTORY — DX: Other specified postprocedural states: R11.2

## 2013-09-27 LAB — CBC
HEMATOCRIT: 34.9 % — AB (ref 36.0–46.0)
HEMOGLOBIN: 10.7 g/dL — AB (ref 12.0–15.0)
MCH: 25.8 pg — AB (ref 26.0–34.0)
MCHC: 30.7 g/dL (ref 30.0–36.0)
MCV: 84.1 fL (ref 78.0–100.0)
Platelets: 330 10*3/uL (ref 150–400)
RBC: 4.15 MIL/uL (ref 3.87–5.11)
RDW: 16.5 % — AB (ref 11.5–15.5)
WBC: 11 10*3/uL — ABNORMAL HIGH (ref 4.0–10.5)

## 2013-09-27 LAB — BASIC METABOLIC PANEL
BUN: 15 mg/dL (ref 6–23)
CHLORIDE: 102 meq/L (ref 96–112)
CO2: 29 meq/L (ref 19–32)
CREATININE: 0.88 mg/dL (ref 0.50–1.10)
Calcium: 9.4 mg/dL (ref 8.4–10.5)
GFR calc Af Amer: 72 mL/min — ABNORMAL LOW (ref >90)
GFR calc non Af Amer: 62 mL/min — ABNORMAL LOW (ref >90)
GLUCOSE: 111 mg/dL — AB (ref 70–99)
Potassium: 4 mEq/L (ref 3.7–5.3)
Sodium: 144 mEq/L (ref 137–147)

## 2013-10-04 NOTE — H&P (Signed)
[facility] H&P   Chief Complaint: symptomatic HW L femur  HPI:   79 y/o female well known to OTS after fixation of periprosthetic L distal femur fx. Pt has done well since surgery but does have symptomatic HW. Presents today for [facility].   Past Medical History  Diagnosis Date  . Left ventricular hypertrophy   . Chronic venous insufficiency   . Hypertriglyceridemia   . Hypertension   . Arthritis   . Allergy   . Impaired fasting glucose   . Hx of colonic polyps   . Gastric ulcer   . Vitamin B12 deficiency   . Sleep disorder   . PONV (postoperative nausea and vomiting)   . Shortness of breath     due to weigh  . Blood dyscrasia     bleed freely, hard to stop  . Fibromyalgia     constant pain    Past Surgical History  Procedure Laterality Date  . Cholecystectomy    . Tonsillectomy    . External fixation wrist fracture  1985    left with pins  . External fixation ankle fracture      Fx.  left ankle-fixation with pins later removed sec to infection 1985  . Cataract extraction  10/2003    OD  . Abdominal hysterectomy    . Hemiarthroplasty shoulder fracture  06/2009  . Femur fracture surgery  06/2009  . Total knee arthroplasty  03/09    left  . Tonsillectomy    . Fracture surgery    . Eye surgery    . Joint replacement    . Colonoscopy w/ polypectomy      Family History  Problem Relation Age of Onset  . Depression Mother   . Cancer Mother     uterine cancer  . Cancer Brother     prostate cancer  . Diabetes Maternal Aunt   . Arthritis Brother   . Asthma Brother    Social History:  reports that she quit smoking about 20 years ago. Her smoking use included Cigarettes. She has a 40 pack-year smoking history. She has never used smokeless tobacco. She reports that she drinks alcohol. She reports that she does not use illicit drugs.  Allergies:  Allergies  Allergen Reactions  . Codeine Sulfate Shortness Of Breath  . Celecoxib Other (See Comments)     Pt bled out  . Erythromycin Base Nausea And Vomiting   Current Outpatient Prescriptions on File Prior to Visit   Medication  Sig  Dispense  Refill   .  acetaminophen (TYLENOL) 500 MG tablet  Take 500-1,000 mg by mouth daily as needed.           Marland Kitchen  KLOR-CON M20 20 MEQ tablet  TAKE 1 TABLET TWICE A DAY   180 tablet   3   .  latanoprost (XALATAN) 0.005 % ophthalmic solution  Place 1 drop into the right eye at bedtime.          .  temazepam (RESTORIL) 15 MG capsule  Take 1 capsule (15 mg total) by mouth at bedtime as needed.   90 capsule   0   .  traMADol (ULTRAM) 50 MG tablet  Take 1 tablet (50 mg total) by mouth 3 (three) times daily as needed.   270 tablet   0   .  triamcinolone cream (KENALOG) 0.1 %  Apply topically 2 (two) times daily.   45 g   3   .  triamterene-hydrochlorothiazide (MAXZIDE-25) 37.5-25 MG  per tablet  Take 1 tablet by mouth daily.   90 tablet   3   .  vitamin B-12 (CYANOCOBALAMIN) 1000 MCG tablet  Take 1,000 mcg by mouth daily.            No results found for this or any previous visit (from the past 48 hour(s)). No results found.  Review of Systems  Constitutional: Negative for fever and chills.  Respiratory: Negative for shortness of breath.   Cardiovascular: Negative for chest pain and palpitations.  Gastrointestinal: Negative for nausea and vomiting.  Musculoskeletal:       L leg pain  R knee pain   Neurological: Negative for headaches.  Psychiatric/Behavioral: Positive for depression. The patient has insomnia.    AF VSS at office   Physical Exam  Constitutional: She is cooperative.  Cardiovascular: Normal rate, regular rhythm, S1 normal and S2 normal.   Respiratory: Effort normal and breath sounds normal. She has no wheezes. She has no rhonchi.  GI:  Obese, soft, NTND  Musculoskeletal:  Left Lower Extremity   Surgical wounds well healed   Distal motor and sensory functions intact   Ext warm   + DP pulse    No DCT    Good knee ROM       Assessment/Plan  79 y/o female s/p ORIF periprosthetic L distal femur fx with symptomatic HW   OR for ROM L distal femur Outpt vs overnight observation Risks and benefits of surgery reviewed, pt wishes to proceed  Jari Pigg, PA-C [facility] [phone] (P)  10/04/2013, 9:47 PM

## 2013-10-05 ENCOUNTER — Observation Stay (HOSPITAL_COMMUNITY)
Admission: RE | Admit: 2013-10-05 | Discharge: 2013-10-06 | Disposition: A | Discharge status: Home / Self Care | Payer: Medicare Other | Source: Ambulatory Visit | Attending: Orthopedic Surgery | Admitting: Orthopedic Surgery

## 2013-10-05 ENCOUNTER — Encounter (HOSPITAL_COMMUNITY)
Admission: RE | Disposition: A | Discharge status: Home / Self Care | Payer: Self-pay | Source: Ambulatory Visit | Attending: Orthopedic Surgery

## 2013-10-05 ENCOUNTER — Ambulatory Visit (HOSPITAL_COMMUNITY): Payer: Medicare Other

## 2013-10-05 ENCOUNTER — Encounter (HOSPITAL_COMMUNITY): Payer: Medicare Other | Admitting: Anesthesiology

## 2013-10-05 ENCOUNTER — Ambulatory Visit (HOSPITAL_COMMUNITY): Payer: Medicare Other | Admitting: Anesthesiology

## 2013-10-05 ENCOUNTER — Encounter (HOSPITAL_COMMUNITY): Payer: Self-pay | Admitting: *Deleted

## 2013-10-05 DIAGNOSIS — I739 Peripheral vascular disease, unspecified: Secondary | ICD-10-CM | POA: Insufficient documentation

## 2013-10-05 DIAGNOSIS — Z8711 Personal history of peptic ulcer disease: Secondary | ICD-10-CM | POA: Diagnosis not present

## 2013-10-05 DIAGNOSIS — Z87891 Personal history of nicotine dependence: Secondary | ICD-10-CM | POA: Insufficient documentation

## 2013-10-05 DIAGNOSIS — I872 Venous insufficiency (chronic) (peripheral): Secondary | ICD-10-CM | POA: Insufficient documentation

## 2013-10-05 DIAGNOSIS — I517 Cardiomegaly: Secondary | ICD-10-CM | POA: Diagnosis not present

## 2013-10-05 DIAGNOSIS — Y831 Surgical operation with implant of artificial internal device as the cause of abnormal reaction of the patient, or of later complication, without mention of misadventure at the time of the procedure: Secondary | ICD-10-CM | POA: Insufficient documentation

## 2013-10-05 DIAGNOSIS — Z96619 Presence of unspecified artificial shoulder joint: Secondary | ICD-10-CM | POA: Insufficient documentation

## 2013-10-05 DIAGNOSIS — IMO0001 Reserved for inherently not codable concepts without codable children: Secondary | ICD-10-CM | POA: Insufficient documentation

## 2013-10-05 DIAGNOSIS — Z860100 Personal history of colon polyps, unspecified: Secondary | ICD-10-CM | POA: Insufficient documentation

## 2013-10-05 DIAGNOSIS — Z6841 Body Mass Index (BMI) 40.0 and over, adult: Secondary | ICD-10-CM | POA: Insufficient documentation

## 2013-10-05 DIAGNOSIS — T8484XA Pain due to internal orthopedic prosthetic devices, implants and grafts, initial encounter: Secondary | ICD-10-CM | POA: Diagnosis present

## 2013-10-05 DIAGNOSIS — Z96659 Presence of unspecified artificial knee joint: Secondary | ICD-10-CM | POA: Insufficient documentation

## 2013-10-05 DIAGNOSIS — M159 Polyosteoarthritis, unspecified: Secondary | ICD-10-CM | POA: Diagnosis present

## 2013-10-05 DIAGNOSIS — M1710 Unilateral primary osteoarthritis, unspecified knee: Secondary | ICD-10-CM | POA: Insufficient documentation

## 2013-10-05 DIAGNOSIS — E538 Deficiency of other specified B group vitamins: Secondary | ICD-10-CM | POA: Diagnosis present

## 2013-10-05 DIAGNOSIS — E781 Pure hyperglyceridemia: Secondary | ICD-10-CM | POA: Diagnosis present

## 2013-10-05 DIAGNOSIS — I10 Essential (primary) hypertension: Secondary | ICD-10-CM | POA: Diagnosis not present

## 2013-10-05 DIAGNOSIS — IMO0002 Reserved for concepts with insufficient information to code with codable children: Secondary | ICD-10-CM | POA: Diagnosis not present

## 2013-10-05 DIAGNOSIS — T8489XA Other specified complication of internal orthopedic prosthetic devices, implants and grafts, initial encounter: Secondary | ICD-10-CM | POA: Diagnosis not present

## 2013-10-05 DIAGNOSIS — Z472 Encounter for removal of internal fixation device: Secondary | ICD-10-CM | POA: Diagnosis not present

## 2013-10-05 DIAGNOSIS — M25569 Pain in unspecified knee: Secondary | ICD-10-CM | POA: Diagnosis not present

## 2013-10-05 DIAGNOSIS — T847XXA Infection and inflammatory reaction due to other internal orthopedic prosthetic devices, implants and grafts, initial encounter: Secondary | ICD-10-CM | POA: Diagnosis not present

## 2013-10-05 HISTORY — PX: HARDWARE REMOVAL: SHX979

## 2013-10-05 HISTORY — PX: STERIOD INJECTION: SHX5046

## 2013-10-05 SURGERY — REMOVAL, HARDWARE
Anesthesia: General | Site: Leg Upper | Laterality: Right

## 2013-10-05 MED ORDER — CEFAZOLIN SODIUM 1-5 GM-% IV SOLN
INTRAVENOUS | Status: AC
  Filled 2013-10-05: qty 50

## 2013-10-05 MED ORDER — TEMAZEPAM 15 MG PO CAPS: 15.0000 mg | ORAL_CAPSULE | Freq: Every evening | ORAL | Status: DC | PRN

## 2013-10-05 MED ORDER — METOCLOPRAMIDE HCL 5 MG/ML IJ SOLN: 5.0000 mg | Freq: Three times a day (TID) | INTRAMUSCULAR | Status: DC | PRN

## 2013-10-05 MED ORDER — CEFAZOLIN SODIUM 1-5 GM-% IV SOLN
1.0000 g | Freq: Four times a day (QID) | INTRAVENOUS | Status: AC
  Administered 2013-10-05 – 2013-10-06 (×3): 1 g via INTRAVENOUS
  Filled 2013-10-05 (×3): qty 50

## 2013-10-05 MED ORDER — FENTANYL CITRATE 0.05 MG/ML IJ SOLN
INTRAMUSCULAR | Status: AC
  Filled 2013-10-05: qty 5

## 2013-10-05 MED ORDER — POTASSIUM CHLORIDE CRYS ER 20 MEQ PO TBCR
20.0000 meq | EXTENDED_RELEASE_TABLET | Freq: Two times a day (BID) | ORAL | Status: DC
  Administered 2013-10-05 – 2013-10-06 (×2): 20 meq via ORAL
  Filled 2013-10-05 (×4): qty 1

## 2013-10-05 MED ORDER — OXYCODONE HCL 5 MG PO TABS: 5.0000 mg | ORAL_TABLET | ORAL | Status: DC | PRN

## 2013-10-05 MED ORDER — LIDOCAINE HCL (CARDIAC) 20 MG/ML IV SOLN
INTRAVENOUS | Status: AC
  Filled 2013-10-05: qty 5

## 2013-10-05 MED ORDER — METHYLPREDNISOLONE ACETATE 40 MG/ML IJ SUSP
INTRAMUSCULAR | Status: AC
  Filled 2013-10-05: qty 1

## 2013-10-05 MED ORDER — ONDANSETRON HCL 4 MG/2ML IJ SOLN
INTRAMUSCULAR | Status: DC | PRN
  Administered 2013-10-05: 4 mg via INTRAVENOUS

## 2013-10-05 MED ORDER — LATANOPROST 0.005 % OP SOLN
1.0000 [drp] | Freq: Every day | OPHTHALMIC | Status: DC
  Administered 2013-10-05: 1 [drp] via OPHTHALMIC
  Filled 2013-10-05: qty 2.5

## 2013-10-05 MED ORDER — BUPIVACAINE-EPINEPHRINE PF 0.5-1:200000 % IJ SOLN
INTRAMUSCULAR | Status: DC | PRN
  Administered 2013-10-05: 30 mL

## 2013-10-05 MED ORDER — NEOSTIGMINE METHYLSULFATE 1 MG/ML IJ SOLN
INTRAMUSCULAR | Status: AC
  Filled 2013-10-05: qty 10

## 2013-10-05 MED ORDER — CEFAZOLIN SODIUM-DEXTROSE 2-3 GM-% IV SOLR
INTRAVENOUS | Status: AC
Start: 2013-10-05 — End: 2013-10-05
  Administered 2013-10-05: 3 g via INTRAVENOUS
  Filled 2013-10-05: qty 50

## 2013-10-05 MED ORDER — ROCURONIUM BROMIDE 100 MG/10ML IV SOLN
INTRAVENOUS | Status: DC | PRN
  Administered 2013-10-05: 50 mg via INTRAVENOUS

## 2013-10-05 MED ORDER — PROPOFOL 10 MG/ML IV BOLUS
INTRAVENOUS | Status: DC | PRN
  Administered 2013-10-05: 200 mg via INTRAVENOUS

## 2013-10-05 MED ORDER — NEOSTIGMINE METHYLSULFATE 1 MG/ML IJ SOLN
INTRAMUSCULAR | Status: DC | PRN
  Administered 2013-10-05: 5 mg via INTRAVENOUS

## 2013-10-05 MED ORDER — POTASSIUM CHLORIDE IN NACL 20-0.9 MEQ/L-% IV SOLN
INTRAVENOUS | Status: DC
  Administered 2013-10-05: 20 mL/h via INTRAVENOUS
  Filled 2013-10-05: qty 1000

## 2013-10-05 MED ORDER — ONDANSETRON HCL 4 MG PO TABS: 4.0000 mg | ORAL_TABLET | Freq: Four times a day (QID) | ORAL | Status: DC | PRN

## 2013-10-05 MED ORDER — TRAMADOL HCL 50 MG PO TABS: 50.0000 mg | ORAL_TABLET | Freq: Three times a day (TID) | ORAL | Status: DC | PRN

## 2013-10-05 MED ORDER — VITAMIN B-12 1000 MCG PO TABS
1000.0000 ug | ORAL_TABLET | Freq: Every day | ORAL | Status: DC
  Administered 2013-10-05 – 2013-10-06 (×2): 1000 ug via ORAL
  Filled 2013-10-05 (×2): qty 1

## 2013-10-05 MED ORDER — METHYLPREDNISOLONE ACETATE 40 MG/ML IJ SUSP
INTRAMUSCULAR | Status: DC | PRN
  Administered 2013-10-05 (×2): 40 mg

## 2013-10-05 MED ORDER — FENTANYL CITRATE 0.05 MG/ML IJ SOLN
INTRAMUSCULAR | Status: AC
  Filled 2013-10-05: qty 2

## 2013-10-05 MED ORDER — PROPOFOL 10 MG/ML IV BOLUS
INTRAVENOUS | Status: AC
  Filled 2013-10-05: qty 20

## 2013-10-05 MED ORDER — MIDAZOLAM HCL 2 MG/2ML IJ SOLN
INTRAMUSCULAR | Status: AC
  Filled 2013-10-05: qty 2

## 2013-10-05 MED ORDER — GLYCOPYRROLATE 0.2 MG/ML IJ SOLN
INTRAMUSCULAR | Status: AC
  Filled 2013-10-05: qty 3

## 2013-10-05 MED ORDER — MIDAZOLAM HCL 5 MG/5ML IJ SOLN
INTRAMUSCULAR | Status: DC | PRN
  Administered 2013-10-05: 2 mg via INTRAVENOUS

## 2013-10-05 MED ORDER — GLYCOPYRROLATE 0.2 MG/ML IJ SOLN
INTRAMUSCULAR | Status: DC | PRN
  Administered 2013-10-05: .8 mg via INTRAVENOUS

## 2013-10-05 MED ORDER — ALBUTEROL SULFATE HFA 108 (90 BASE) MCG/ACT IN AERS
INHALATION_SPRAY | RESPIRATORY_TRACT | Status: DC | PRN
  Administered 2013-10-05 (×2): 4 via RESPIRATORY_TRACT

## 2013-10-05 MED ORDER — METHOCARBAMOL 100 MG/ML IJ SOLN
500.0000 mg | Freq: Four times a day (QID) | INTRAVENOUS | Status: DC | PRN
  Filled 2013-10-05: qty 5

## 2013-10-05 MED ORDER — ONDANSETRON HCL 4 MG/2ML IJ SOLN
INTRAMUSCULAR | Status: AC
  Filled 2013-10-05: qty 2

## 2013-10-05 MED ORDER — METOCLOPRAMIDE HCL 10 MG PO TABS: 5.0000 mg | ORAL_TABLET | Freq: Three times a day (TID) | ORAL | Status: DC | PRN

## 2013-10-05 MED ORDER — ONDANSETRON HCL 4 MG/2ML IJ SOLN: 4.0000 mg | Freq: Four times a day (QID) | INTRAMUSCULAR | Status: DC | PRN

## 2013-10-05 MED ORDER — TRAMADOL HCL 50 MG PO TABS
ORAL_TABLET | ORAL | Status: AC
Start: 2013-10-05 — End: 2013-10-06
  Filled 2013-10-05: qty 1

## 2013-10-05 MED ORDER — MORPHINE SULFATE 2 MG/ML IJ SOLN: 1.0000 mg | INTRAMUSCULAR | Status: DC | PRN

## 2013-10-05 MED ORDER — FENTANYL CITRATE 0.05 MG/ML IJ SOLN
INTRAMUSCULAR | Status: DC | PRN
  Administered 2013-10-05: 50 ug via INTRAVENOUS
  Administered 2013-10-05: 100 ug via INTRAVENOUS
  Administered 2013-10-05 (×2): 50 ug via INTRAVENOUS

## 2013-10-05 MED ORDER — LIDOCAINE HCL (CARDIAC) 20 MG/ML IV SOLN
INTRAVENOUS | Status: DC | PRN
  Administered 2013-10-05: 80 mg via INTRAVENOUS
  Administered 2013-10-05: 20 mg via INTRAVENOUS

## 2013-10-05 MED ORDER — TRIAMTERENE-HCTZ 37.5-25 MG PO TABS
1.0000 | ORAL_TABLET | Freq: Every day | ORAL | Status: DC
  Administered 2013-10-05 – 2013-10-06 (×2): 1 via ORAL
  Filled 2013-10-05 (×2): qty 1

## 2013-10-05 MED ORDER — LACTATED RINGERS IV SOLN
INTRAVENOUS | Status: DC | PRN
  Administered 2013-10-05: 07:00:00 via INTRAVENOUS

## 2013-10-05 MED ORDER — 0.9 % SODIUM CHLORIDE (POUR BTL) OPTIME
TOPICAL | Status: DC | PRN
  Administered 2013-10-05: 1000 mL

## 2013-10-05 MED ORDER — ROCURONIUM BROMIDE 50 MG/5ML IV SOLN
INTRAVENOUS | Status: AC
  Filled 2013-10-05: qty 1

## 2013-10-05 MED ORDER — BUPIVACAINE-EPINEPHRINE (PF) 0.5% -1:200000 IJ SOLN
INTRAMUSCULAR | Status: AC
  Filled 2013-10-05: qty 10

## 2013-10-05 MED ORDER — FENTANYL CITRATE 0.05 MG/ML IJ SOLN
25.0000 ug | INTRAMUSCULAR | Status: DC | PRN
  Administered 2013-10-05: 50 ug via INTRAVENOUS
  Administered 2013-10-05: 25 ug via INTRAVENOUS
  Administered 2013-10-05: 50 ug via INTRAVENOUS
  Administered 2013-10-05: 25 ug via INTRAVENOUS

## 2013-10-05 MED ORDER — METHOCARBAMOL 500 MG PO TABS
500.0000 mg | ORAL_TABLET | Freq: Four times a day (QID) | ORAL | Status: DC | PRN
  Filled 2013-10-05: qty 1

## 2013-10-05 MED ORDER — TRAMADOL HCL 50 MG PO TABS
50.0000 mg | ORAL_TABLET | Freq: Once | ORAL | Status: AC
  Administered 2013-10-05: 50 mg via ORAL

## 2013-10-05 SURGICAL SUPPLY — 68 items
BANDAGE ELASTIC 4 VELCRO ST LF (GAUZE/BANDAGES/DRESSINGS) ×4 IMPLANT
BANDAGE ELASTIC 6 VELCRO ST LF (GAUZE/BANDAGES/DRESSINGS) ×4 IMPLANT
BANDAGE ESMARK 6X9 LF (GAUZE/BANDAGES/DRESSINGS) ×2 IMPLANT
BANDAGE GAUZE ELAST BULKY 4 IN (GAUZE/BANDAGES/DRESSINGS) ×4 IMPLANT
BNDG CMPR 9X6 STRL LF SNTH (GAUZE/BANDAGES/DRESSINGS) ×2
BNDG COHESIVE 6X5 TAN STRL LF (GAUZE/BANDAGES/DRESSINGS) ×4 IMPLANT
BNDG ESMARK 6X9 LF (GAUZE/BANDAGES/DRESSINGS) ×4
BRUSH SCRUB DISP (MISCELLANEOUS) ×8 IMPLANT
CLEANER TIP ELECTROSURG 2X2 (MISCELLANEOUS) ×4 IMPLANT
CLOSURE WOUND 1/2 X4 (GAUZE/BANDAGES/DRESSINGS)
COVER SURGICAL LIGHT HANDLE (MISCELLANEOUS) ×8 IMPLANT
CUFF TOURNIQUET SINGLE 18IN (TOURNIQUET CUFF) IMPLANT
CUFF TOURNIQUET SINGLE 24IN (TOURNIQUET CUFF) IMPLANT
CUFF TOURNIQUET SINGLE 34IN LL (TOURNIQUET CUFF) IMPLANT
DRAPE C-ARM 42X72 X-RAY (DRAPES) IMPLANT
DRAPE C-ARMOR (DRAPES) ×4 IMPLANT
DRAPE OEC MINIVIEW 54X84 (DRAPES) ×4 IMPLANT
DRAPE U-SHAPE 47X51 STRL (DRAPES) ×4 IMPLANT
DRSG ADAPTIC 3X8 NADH LF (GAUZE/BANDAGES/DRESSINGS) ×2 IMPLANT
DRSG MEPILEX BORDER 4X12 (GAUZE/BANDAGES/DRESSINGS) ×2 IMPLANT
ELECT REM PT RETURN 9FT ADLT (ELECTROSURGICAL) ×4
ELECTRODE REM PT RTRN 9FT ADLT (ELECTROSURGICAL) ×2 IMPLANT
EVACUATOR 1/8 PVC DRAIN (DRAIN) IMPLANT
GLOVE BIO SURGEON STRL SZ7 (GLOVE) ×4 IMPLANT
GLOVE BIO SURGEON STRL SZ7.5 (GLOVE) ×4 IMPLANT
GLOVE BIO SURGEON STRL SZ8 (GLOVE) ×4 IMPLANT
GLOVE BIOGEL PI IND STRL 7.5 (GLOVE) ×2 IMPLANT
GLOVE BIOGEL PI IND STRL 8 (GLOVE) ×2 IMPLANT
GLOVE BIOGEL PI INDICATOR 7.5 (GLOVE) ×6
GLOVE BIOGEL PI INDICATOR 8 (GLOVE) ×2
GOWN STRL REUS W/ TWL LRG LVL3 (GOWN DISPOSABLE) ×4 IMPLANT
GOWN STRL REUS W/ TWL XL LVL3 (GOWN DISPOSABLE) ×2 IMPLANT
GOWN STRL REUS W/TWL LRG LVL3 (GOWN DISPOSABLE) ×12
GOWN STRL REUS W/TWL XL LVL3 (GOWN DISPOSABLE) ×4
KIT BASIN OR (CUSTOM PROCEDURE TRAY) ×4 IMPLANT
KIT ROOM TURNOVER OR (KITS) ×4 IMPLANT
MANIFOLD NEPTUNE II (INSTRUMENTS) ×4 IMPLANT
NDL HYPO 25GX1X1/2 BEV (NEEDLE) IMPLANT
NEEDLE 22X1 1/2 (OR ONLY) (NEEDLE) IMPLANT
NEEDLE HYPO 25GX1X1/2 BEV (NEEDLE) ×8 IMPLANT
NS IRRIG 1000ML POUR BTL (IV SOLUTION) ×4 IMPLANT
PACK ORTHO EXTREMITY (CUSTOM PROCEDURE TRAY) ×4 IMPLANT
PAD ARMBOARD 7.5X6 YLW CONV (MISCELLANEOUS) ×8 IMPLANT
PADDING CAST COTTON 6X4 STRL (CAST SUPPLIES) ×6 IMPLANT
SPONGE GAUZE 4X4 12PLY (GAUZE/BANDAGES/DRESSINGS) ×2 IMPLANT
SPONGE LAP 18X18 X RAY DECT (DISPOSABLE) ×4 IMPLANT
SPONGE SCRUB IODOPHOR (GAUZE/BANDAGES/DRESSINGS) ×4 IMPLANT
STAPLER VISISTAT 35W (STAPLE) IMPLANT
STOCKINETTE IMPERVIOUS LG (DRAPES) ×4 IMPLANT
STRIP CLOSURE SKIN 1/2X4 (GAUZE/BANDAGES/DRESSINGS) IMPLANT
SUCTION FRAZIER TIP 10 FR DISP (SUCTIONS) IMPLANT
SUT ETHILON 2 0 PSLX (SUTURE) ×4 IMPLANT
SUT ETHILON 3 0 PS 1 (SUTURE) ×2 IMPLANT
SUT PDS AB 2-0 CT1 27 (SUTURE) IMPLANT
SUT VIC AB 0 CT1 27 (SUTURE) ×4
SUT VIC AB 0 CT1 27XBRD ANBCTR (SUTURE) IMPLANT
SUT VIC AB 1 CT1 27 (SUTURE) ×4
SUT VIC AB 1 CT1 27XBRD ANBCTR (SUTURE) IMPLANT
SUT VIC AB 2-0 CT1 27 (SUTURE) ×4
SUT VIC AB 2-0 CT1 TAPERPNT 27 (SUTURE) IMPLANT
SYR CONTROL 10ML LL (SYRINGE) ×4 IMPLANT
TOWEL OR 17X24 6PK STRL BLUE (TOWEL DISPOSABLE) ×8 IMPLANT
TOWEL OR 17X26 10 PK STRL BLUE (TOWEL DISPOSABLE) ×8 IMPLANT
TUBE CONNECTING 12'X1/4 (SUCTIONS) ×1
TUBE CONNECTING 12X1/4 (SUCTIONS) ×3 IMPLANT
UNDERPAD 30X30 INCONTINENT (UNDERPADS AND DIAPERS) ×4 IMPLANT
WATER STERILE IRR 1000ML POUR (IV SOLUTION) ×4 IMPLANT
YANKAUER SUCT BULB TIP NO VENT (SUCTIONS) ×4 IMPLANT

## 2013-10-05 NOTE — Op Note (Signed)
NAMESOUA, LENK               ACCOUNT NO.:  [id number]  MEDICAL RECORD NO.:  [id number]  LOCATION:  [facility]                         FACILITY:  [place]  PHYSICIAN:  Astrid Divine. Marcelino Scot, M.D. DATE OF BIRTH:  [birth year]  DATE OF PROCEDURE:  10/05/2013 DATE OF DISCHARGE:                              OPERATIVE REPORT   PREOPERATIVE DIAGNOSES: 1. Symptomatic hardware, left femur. 2. Right knee arthritis.  POSTOPERATIVE DIAGNOSES: 1. Symptomatic hardware, left femur. 2. Right knee arthritis.  PROCEDURE: 1. Hardware removal, left distal femur. 2. Steroid injection, right knee.  SURGEON:  Astrid Divine. Marcelino Scot, MD  ASSISTANT:  None.  ANESTHESIA:  General.  COMPLICATIONS:  None.  TOURNIQUET:  None.  DISPOSITION:  To PACU.  CONDITION:  Stable.  BRIEF SUMMARY AND INDICATION FOR PROCEDURE:  Chelsea Keller is a very pleasant 79 year old female, who underwent repair of a peri implant distal femur fracture after TKA.  Despite years of attempted management of her lateral knee pain over the plate, the patient had persistent symptoms and difficulty with everyday activities secondary to this.  She also has right knee arthritis and has requested intra-articular injection while she is asleep.  She understood the risks of procedure include failure to eliminate symptoms, possibility of occult nonunion or new fracture that could occur through the old hardware on holes.  She also understood the risk of infection, nerve injury, vessel injury, DVT, loss of motion, and others.  She did wish to proceed.  BRIEF DESCRIPTION OF PROCEDURE:  [name] was given 2 g of Ancef preoperatively and taken to the operating room where general anesthesia was induced.  The right knee was scrubbed and painted, and then an intra- articular injection of 80 mg of Kenalog and 4 mL of Marcaine injected. The left lower extremity was then prepped and draped in usual sterile fashion.  The old lateral incision was remade  where a separate percutaneous incision extended proximally.  The new incision was curved to incorporate.  Dissection was carried down to the plate.  All screw heads were exposed and then the screws were removed without complication.  C-arm confirmed removal of all hardware with no evidence of occult nonunion or other bony defect.  Wound was irrigated thoroughly and closed in standard layered fashion with #1 Vicryl, 0 Vicryl, 2-0 Vicryl, 3-0 nylon using combination of horizontal mattress and simple sutures.  The patient then had an application of a sterile gently compressive dressing, was taken to PACU in stable condition.  PROGNOSIS:  [name] will be weightbearing as tolerated bilaterally. She is planning to undergo right total knee arthroplasty once she recovers from removal of hardware on the left, and I anticipate something in the next 6 months or so.  We are hopeful that injection will provide adequate relief to recover her activities of daily living.     Astrid Divine. Marcelino Scot, M.D.     MHH/MEDQ  D:  10/05/2013  T:  10/05/2013  Job:  [id number]

## 2013-10-05 NOTE — Progress Notes (Signed)
Report given to [employer] as caregiver

## 2013-10-05 NOTE — Anesthesia Postprocedure Evaluation (Signed)
  Anesthesia Post-op Note  Patient: Chelsea Keller  Procedure(s) Performed: Procedure(s): HARDWARE REMOVAL LEFT DISTAL FEMUR (Left) STEROID INJECTION (Right)  Patient Location: PACU  Anesthesia Type:General  Level of Consciousness: awake  Airway and Oxygen Therapy: Patient Spontanous Breathing  Post-op Pain: mild  Post-op Assessment: Post-op Vital signs reviewed  Post-op Vital Signs: Reviewed  Complications: No apparent anesthesia complications

## 2013-10-05 NOTE — H&P (Signed)
I have seen and examined the patient. I agree with the findings above.  Symptomatic hardware left femur, endstage DJD right knee for [facility] and injection, respectively.  I discussed with the patient the risks and benefits of surgery, including the possibility of infection, nerve injury, vessel injury, wound breakdown, DVT/ PE, loss of motion, failure to resolve symptoms, and need for further surgery among others.  She understood these risks and wished to proceed.   Rozanna Box, MD 10/05/2013 7:54 AM

## 2013-10-05 NOTE — Transfer of Care (Signed)
Immediate Anesthesia Transfer of Care Note  Patient: Chelsea Keller  Procedure(s) Performed: Procedure(s): HARDWARE REMOVAL LEFT DISTAL FEMUR (Left) STEROID INJECTION (Right)  Patient Location: PACU  Anesthesia Type:General  Level of Consciousness: awake, alert  and oriented  Airway & Oxygen Therapy: Patient Spontanous Breathing and Patient connected to face mask oxygen  Post-op Assessment: Report given to PACU RN and Post -op Vital signs reviewed and stable  Post vital signs: Reviewed and stable  Complications: No apparent anesthesia complications

## 2013-10-05 NOTE — Addendum Note (Signed)
Addendum created 10/05/13 1444 by Finis Bud, MD   Modules edited: Clinical Notes   Clinical Notes:  File: [id number]

## 2013-10-05 NOTE — Brief Op Note (Signed)
10/05/2013  10:21 AM  PATIENT:  Chelsea Keller  79 y.o. female  PRE-OPERATIVE DIAGNOSIS:  Symptomatic hardware left femur, right knee arthritis  POST-OPERATIVE DIAGNOSIS:  Symptomatic hardware left femur, right knee arthritis  PROCEDURE:  Procedure(s): 1. HARDWARE REMOVAL LEFT DISTAL FEMUR (Left) 2. STEROID INJECTION (Right)  SURGEON:  Surgeon(s) and Role:    * Rozanna Box, MD - Primary  ANESTHESIA:   general  I/O:  Total I/O In: 1000 [I.V.:1000] Out: 70 [Blood:70]  SPECIMEN:  No Specimen  TOURNIQUET:  * No tourniquets in log *  DICTATION: .Other Dictation: Dictation Number [id number]

## 2013-10-05 NOTE — Anesthesia Preprocedure Evaluation (Addendum)
Anesthesia Evaluation  Patient identified by MRN, date of birth, ID band Patient awake    Reviewed: Allergy & Precautions, H&P , NPO status , Patient's Chart, lab work & pertinent test results  History of Anesthesia Complications (+) PONV and history of anesthetic complications  Airway Mallampati: II TM Distance: >3 FB Neck ROM: Full    Dental  (+) Dental Advisory Given, Edentulous Upper, Edentulous Lower   Pulmonary shortness of breath and with exertion, former smoker,  breath sounds clear to auscultation        Cardiovascular hypertension, Pt. on medications + Peripheral Vascular Disease Rhythm:Regular Rate:Normal     Neuro/Psych negative psych ROS   GI/Hepatic negative GI ROS, Neg liver ROS, PUD,   Endo/Other  negative endocrine ROSMorbid obesity  Renal/GU negative Renal ROS     Musculoskeletal  (+) Fibromyalgia -  Abdominal   Peds  Hematology   Anesthesia Other Findings   Reproductive/Obstetrics                        Anesthesia Physical Anesthesia Plan  ASA: III  Anesthesia Plan: General   Post-op Pain Management:    Induction: Intravenous  Airway Management Planned: Oral ETT  Additional Equipment:   Intra-op Plan:   Post-operative Plan: Extubation in OR  Informed Consent: I have reviewed the patients History and Physical, chart, labs and discussed the procedure including the risks, benefits and alternatives for the proposed anesthesia with the patient or authorized representative who has indicated his/her understanding and acceptance.   Dental advisory given  Plan Discussed with: CRNA, Anesthesiologist and Surgeon  Anesthesia Plan Comments:         Anesthesia Quick Evaluation

## 2013-10-05 NOTE — Discharge Instructions (Signed)
Orthopaedic Trauma Service Discharge Instructions   General Discharge Instructions  WEIGHT BEARING STATUS: Weightbear as tolerated Bilaterally   RANGE OF MOTION/ACTIVITY: Range of motion as tolerated L knee, activity as tolerated   Wound Care: daily dressing changes starting on 10/08/2013. See instructions below   Diet: as you were eating previously.  Can use over the counter stool softeners and bowel preparations, such as Miralax, to help with bowel movements.  Narcotics can be constipating.  Be sure to drink plenty of fluids  STOP SMOKING OR USING NICOTINE PRODUCTS!!!!  As discussed nicotine severely impairs your body's ability to heal surgical and traumatic wounds but also impairs bone healing.  Wounds and bone heal by forming microscopic blood vessels (angiogenesis) and nicotine is a vasoconstrictor (essentially, shrinks blood vessels).  Therefore, if vasoconstriction occurs to these microscopic blood vessels they essentially disappear and are unable to deliver necessary nutrients to the healing tissue.  This is one modifiable factor that you can do to dramatically increase your chances of healing your injury.    (This means no smoking, no nicotine gum, patches, etc)  DO NOT USE NONSTEROIDAL ANTI-INFLAMMATORY DRUGS (NSAID'S)  Using products such as Advil (ibuprofen), Aleve (naproxen), Motrin (ibuprofen) for additional pain control during fracture healing can delay and/or prevent the healing response.  If you would like to take over the counter (OTC) medication, Tylenol (acetaminophen) is ok.  However, some narcotic medications that are given for pain control contain acetaminophen as well. Therefore, you should not exceed more than 4000 mg of tylenol in a day if you do not have liver disease.  Also note that there are may OTC medicines, such as cold medicines and allergy medicines that my contain tylenol as well.  If you have any questions about medications and/or interactions please ask your  doctor/PA or your pharmacist.   PAIN MEDICATION USE AND EXPECTATIONS  You have likely been given narcotic medications to help control your pain.  After a traumatic event that results in an fracture (broken bone) with or without surgery, it is ok to use narcotic pain medications to help control one's pain.  We understand that everyone responds to pain differently and each individual patient will be evaluated on a regular basis for the continued need for narcotic medications. Ideally, narcotic medication use should last no more than 6-8 weeks (coinciding with fracture healing).   As a patient it is your responsibility as well to monitor narcotic medication use and report the amount and frequency you use these medications when you come to your office visit.   We would also advise that if you are using narcotic medications, you should take a dose prior to therapy to maximize you participation.  IF YOU ARE ON NARCOTIC MEDICATIONS IT IS NOT PERMISSIBLE TO OPERATE A MOTOR VEHICLE (MOTORCYCLE/CAR/TRUCK/MOPED) OR HEAVY MACHINERY DO NOT MIX NARCOTICS WITH OTHER CNS (CENTRAL NERVOUS SYSTEM) DEPRESSANTS SUCH AS ALCOHOL       ICE AND ELEVATE INJURED/OPERATIVE EXTREMITY  Using ice and elevating the injured extremity above your heart can help with swelling and pain control.  Icing in a pulsatile fashion, such as 20 minutes on and 20 minutes off, can be followed.    Do not place ice directly on skin. Make sure there is a barrier between to skin and the ice pack.    Using frozen items such as frozen peas works well as the conform nicely to the are that needs to be iced.  USE AN ACE WRAP OR TED HOSE FOR SWELLING  CONTROL  In addition to icing and elevation, Ace wraps or TED hose are used to help limit and resolve swelling.  It is recommended to use Ace wraps or TED hose until you are informed to stop.    When using Ace Wraps start the wrapping distally (farthest away from the body) and wrap proximally (closer to  the body)   Example: If you had surgery on your leg or thing and you do not have a splint on, start the ace wrap at the toes and work your way up to the thigh        If you had surgery on your upper extremity and do not have a splint on, start the ace wrap at your fingers and work your way up to the upper arm  IF YOU ARE IN A SPLINT OR CAST DO NOT [place]   If your splint gets wet for any reason please contact the office immediately. You may shower in your splint or cast as long as you keep it dry.  This can be done by wrapping in a cast cover or garbage back (or similar)  Do Not stick any thing down your splint or cast such as pencils, money, or hangers to try and scratch yourself with.  If you feel itchy take benadryl as prescribed on the bottle for itching  IF YOU ARE IN A CAM BOOT (BLACK BOOT)  You may remove boot periodically. Perform daily dressing changes as noted below.  Wash the liner of the boot regularly and wear a sock when wearing the boot. It is recommended that you sleep in the boot until told otherwise  CALL THE OFFICE WITH ANY QUESTIONS OR CONCERTS: [phone]     Discharge Pin Site Instructions  Dress pins daily with Kerlix roll starting on POD 2. Wrap the Kerlix so that it tamps the skin down around the pin-skin interface to prevent/limit motion of the skin relative to the pin.  (Pin-skin motion is the primary cause of pain and infection related to external fixator pin sites).  Remove any crust or coagulum that may obstruct drainage with a saline moistened gauze or soap and water.  After POD 3, if there is no discernable drainage on the pin site dressing, the interval for change can by increased to every other day.  You may shower with the fixator, cleaning all pin sites gently with soap and water.  If you have a surgical wound this needs to be completely dry and without drainage before showering.  The extremity can be lifted by the fixator to facilitate  wound care and transfers.  Notify the office/Doctor if you experience increasing drainage, redness, or pain from a pin site, or if you notice purulent (thick, snot-like) drainage.  Discharge Wound Care Instructions  Do NOT apply any ointments, solutions or lotions to pin sites or surgical wounds.  These prevent needed drainage and even though solutions like hydrogen peroxide kill bacteria, they also damage cells lining the pin sites that help fight infection.  Applying lotions or ointments can keep the wounds moist and can cause them to breakdown and open up as well. This can increase the risk for infection. When in doubt call the office.  Surgical incisions should be dressed daily.  If any drainage is noted, use one layer of adaptic, then gauze, Kerlix, and an ace wrap.  Once the incision is completely dry and without drainage, it may be left open to air out.  Showering may  begin 36-48 hours later.  Cleaning gently with soap and water.  Traumatic wounds should be dressed daily as well.    One layer of adaptic, gauze, Kerlix, then ace wrap.  The adaptic can be discontinued once the draining has ceased    If you have a wet to dry dressing: wet the gauze with saline the squeeze as much saline out so the gauze is moist (not soaking wet), place moistened gauze over wound, then place a dry gauze over the moist one, followed by Kerlix wrap, then ace wrap.

## 2013-10-05 NOTE — Progress Notes (Signed)
Orthopedic Tech Progress Note Patient Details:  Chelsea Keller 11-22-1934 [id number] Patient stayed she was unable to use frame. Patient ID: Chelsea Keller, female   DOB: 1934-07-09, 79 y.o.   MRN: [id number]   Chelsea Keller 10/05/2013, 8:13 PM

## 2013-10-06 NOTE — Progress Notes (Signed)
Patient discharged home accompanied by daughter. Discharge instructions reviewed with patient and understanding stated. Will follow up as instructed.

## 2013-10-06 NOTE — Evaluation (Signed)
Physical Therapy Evaluation Patient Details Name: Chelsea Keller MRN: [id number] DOB: 10-Jan-1935 Today's Date: 10/06/2013   History of Present Illness  79 y/o female well known to OTS after fixation of periprosthetic L distal femur fx. Pt has done well since surgery but does have symptomatic HW.  Now s/p ROH LLE and injection Rt knee.   Clinical Impression  Patient is functioning at supervision level with mobility and gait.  Will have 24 hour assist at home.  Has all equipment needed.  No further acute PT needs identified - patient ready for discharge from PT perspective.    Follow Up Recommendations No PT follow up;Supervision/Assistance - 24 hour    Equipment Recommendations  None recommended by PT    Recommendations for Other Services       Precautions / Restrictions Precautions Precautions: Fall Restrictions Weight Bearing Restrictions: Yes RLE Weight Bearing: Weight bearing as tolerated LLE Weight Bearing: Weight bearing as tolerated      Mobility  Bed Mobility Overal bed mobility: Modified Independent             General bed mobility comments: Use of bed rail  Transfers Overall transfer level: Modified independent Equipment used: Rolling walker (2 wheeled)             General transfer comment: Patient using proper hand placement and technique from bed and toilet.  Good balance in standing  Ambulation/Gait Ambulation/Gait assistance: Supervision Ambulation Distance (Feet): 32 Feet Assistive device: Rolling walker (2 wheeled) Gait Pattern/deviations: Step-through pattern Gait velocity: slow Gait velocity interpretation: Below normal speed for age/gender General Gait Details: Verbal cues to stand upright and stay close to RW.  Patient places RW too far ahead of herself at times.  Stairs            Wheelchair Mobility    Modified Rankin (Stroke Patients Only)       Balance                                              Pertinent Vitals/Pain     Home Living Family/patient expects to be discharged to:: Private residence Living Arrangements: Children Available Help at Discharge: Family;Available 24 hours/day Type of Home: House Home Access: Level entry     Home Layout: Two level;Bed/bath upstairs Home Equipment: Walker - 4 wheels;Cane - single point;Tub bench;Bedside commode      Prior Function Level of Independence: Independent with assistive device(s)         Comments: Uses cane     Hand Dominance        Extremity/Trunk Assessment   Upper Extremity Assessment: Overall WFL for tasks assessed           Lower Extremity Assessment: Generalized weakness;LLE deficits/detail   LLE Deficits / Details: Strength grossly 3-/5     Communication   Communication: No difficulties  Cognition Arousal/Alertness: Awake/alert Behavior During Therapy: WFL for tasks assessed/performed Overall Cognitive Status: Within Functional Limits for tasks assessed                      General Comments      Exercises        Assessment/Plan    PT Assessment Patent does not need any further PT services  PT Diagnosis     PT Problem List    PT Treatment Interventions     PT Goals (  Current goals can be found in the Care Plan section)      Frequency     Barriers to discharge        Co-evaluation               End of Session Equipment Utilized During Treatment: Gait belt Activity Tolerance: Patient limited by fatigue Patient left: in bed;with call bell/phone within reach Nurse Communication: Mobility status (Ready for discharge)    Functional Assessment Tool Used: Clinical judgement Functional Limitation: Mobility: Walking and moving around Mobility: Walking and Moving Around Current Status (Q7341): At least 1 percent but less than 20 percent impaired, limited or restricted Mobility: Walking and Moving Around Goal Status [phone]): At least 1 percent but less than 20  percent impaired, limited or restricted Mobility: Walking and Moving Around Discharge Status [phone]): At least 1 percent but less than 20 percent impaired, limited or restricted    Time: 0942-1007 PT Time Calculation (min): 25 min   Charges:   PT Evaluation $Initial PT Evaluation Tier I: 1 Procedure PT Treatments $Gait Training: 8-22 mins   PT G Codes:   Functional Assessment Tool Used: Clinical judgement Functional Limitation: Mobility: Walking and moving around    Despina Pole 10/06/2013, 10:30 AM Carita Pian. Sanjuana Kava, [place] Pager [phone]

## 2013-10-06 NOTE — Progress Notes (Signed)
[facility]edic [facility]uma [facility] Progress Note  Subjective  Doing well Ready to go home No issues with breathing    Objective   BP 155/60  Pulse 82  Temp(Src) 98.7 F (37.1 C) (Oral)  Resp 18  Wt 127.461 kg (281 lb)  SpO2 99%  Intake/Output     04/02 0701 - 04/03 0700 04/03 0701 - 04/04 0700   P.O. 240    I.V. (mL/kg) 1240 (9.7)    Total Intake(mL/kg) 1480 (11.6)    Blood 70    Total Output 70     Net +1410          Urine Occurrence 2 x        Exam  Gen: awake and alert, NAD, eating breakfast Lungs: clear, unlabored Cardiac: RRR Abd: soft, NTND, +BS Ext:      Left Lower Extremity  Dressings C/D/I  Distal motor and sensory functions intact  Ext warm  + DP pulse  Swelling stable    Assessment and Plan   POD/HD#: 1   79 y/o female s/p ROH L distal femur and R knee steroid injection for endstage DJD  Pt admitted overnight for observation due to desats in Pacu Much better this am Stable Ready to go home WBAT B LEX Dressing changes starting in 2 days Follow up in 2 weeks with ortho Resume home meds     Jari Pigg, PA-C [facility] [phone] (P) 10/06/2013 8:40 AM

## 2013-10-06 NOTE — Discharge Summary (Signed)
Orthopaedic Trauma Service (OTS)  Patient ID: Chelsea Keller MRN: [id number] DOB/AGE: 1935-01-16 79 y.o.  Admit date: 10/05/2013 Discharge date: 10/06/2013  Admission Diagnoses: Symptomatic hardware left femur DJD right knee B12 deficiency Morbid obesity HTN Left ventricular hypertrophy Hypertriglyceridemia  Discharge Diagnoses:  Active Problems:   B12 DEFICIENCY   HYPERTRIGLYCERIDEMIA   HYPERTENSION   VENTRICULAR HYPERTROPHY, LEFT   Osteoarthritis, multiple sites   Morbid obesity with BMI of 40.0-44.9, adult   Painful orthopaedic hardware   Procedures Performed: 10/05/2013- Dr. Marcelino Scot  1. Hardware removal, left distal femur. 2. Steroid injection, right knee.   Discharged Condition: good  Hospital Course:   Admitted overnight for pain control and observation due to desaturations in the PACU. Patient did well overnight without any issues. In the morning she was doing very well and stable for discharge to home.  Consults: None  Significant Diagnostic Studies: None  Treatments: IV hydration, antibiotics: Ancef, analgesia: Morphine and oxycodone, Ultram, therapies: PT and RN and surgery: As above  Discharge Exam:    Orthopaedic Trauma Service Progress Note  Subjective  Doing well Ready to go home No issues with breathing    Objective   BP 155/60  Pulse 82  Temp(Src) 98.7 F (37.1 C) (Oral)  Resp 18  Wt 127.461 kg (281 lb)  SpO2 99%  Intake/Output     04/02 0701 - 04/03 0700 04/03 0701 - 04/04 0700    P.O. 240     I.V. (mL/kg) 1240 (9.7)     Total Intake(mL/kg) 1480 (11.6)     Blood 70     Total Output 70      Net +1410            Urine Occurrence 2 x         Exam  Gen: awake and alert, NAD, eating breakfast Lungs: clear, unlabored Cardiac: RRR Abd: soft, NTND, +BS Ext:       Left Lower Extremity             Dressings C/D/I             Distal motor and sensory functions intact             Ext warm             + DP pulse     Swelling stable    Assessment and Plan   POD/HD#: 1   79 y/o female s/p ROH L distal femur and R knee steroid injection for endstage DJD  Pt admitted overnight for observation due to desats in Pacu Much better this am Stable Ready to go home WBAT B LEX Dressing changes starting in 2 days Follow up in 2 weeks with ortho Resume home meds     Jari Pigg, PA-C [facility] [phone] (P) 10/06/2013 8:40 AM   Disposition:   Discharge Orders   Future Appointments Provider Department Dept Phone   03/07/2014 12:00 PM Venia Carbon, MD [facility] [phone]   Future Orders Complete By Expires   Call MD / Call 911  As directed    Comments:     If you experience chest pain or shortness of breath, CALL 911 and be transported to the hospital emergency room.  If you develope a fever above 101 F, pus (white drainage) or increased drainage or redness at the wound, or calf pain, call your surgeon's office.   Constipation Prevention  As directed    Comments:  Drink plenty of fluids.  Prune juice may be helpful.  You may use a stool softener, such as Colace (over the counter) 100 mg twice a day.  Use MiraLax (over the counter) for constipation as needed.   Diet - low sodium heart healthy  As directed    Discharge instructions  As directed    Comments:     Orthopaedic Trauma Service Discharge Instructions   General Discharge Instructions  WEIGHT BEARING STATUS: Weightbear as tolerated Bilaterally   RANGE OF MOTION/ACTIVITY: Range of motion as tolerated L knee, activity as tolerated   Wound Care: daily dressing changes starting on 10/08/2013. See instructions below   Diet: as you were eating previously.  Can use over the counter stool softeners and bowel preparations, such as Miralax, to help with bowel movements.  Narcotics can be constipating.  Be sure to drink plenty of fluids  STOP SMOKING OR USING NICOTINE PRODUCTS!!!!  As  discussed nicotine severely impairs your body's ability to heal surgical and traumatic wounds but also impairs bone healing.  Wounds and bone heal by forming microscopic blood vessels (angiogenesis) and nicotine is a vasoconstrictor (essentially, shrinks blood vessels).  Therefore, if vasoconstriction occurs to these microscopic blood vessels they essentially disappear and are unable to deliver necessary nutrients to the healing tissue.  This is one modifiable factor that you can do to dramatically increase your chances of healing your injury.    (This means no smoking, no nicotine gum, patches, etc)  DO NOT USE NONSTEROIDAL ANTI-INFLAMMATORY DRUGS (NSAID'S)  Using products such as Advil (ibuprofen), Aleve (naproxen), Motrin (ibuprofen) for additional pain control during fracture healing can delay and/or prevent the healing response.  If you would like to take over the counter (OTC) medication, Tylenol (acetaminophen) is ok.  However, some narcotic medications that are given for pain control contain acetaminophen as well. Therefore, you should not exceed more than 4000 mg of tylenol in a day if you do not have liver disease.  Also note that there are may OTC medicines, such as cold medicines and allergy medicines that my contain tylenol as well.  If you have any questions about medications and/or interactions please ask your doctor/PA or your pharmacist.   PAIN MEDICATION USE AND EXPECTATIONS  You have likely been given narcotic medications to help control your pain.  After a traumatic event that results in an fracture (broken bone) with or without surgery, it is ok to use narcotic pain medications to help control one's pain.  We understand that everyone responds to pain differently and each individual patient will be evaluated on a regular basis for the continued need for narcotic medications. Ideally, narcotic medication use should last no more than 6-8 weeks (coinciding with fracture healing).   As a  patient it is your responsibility as well to monitor narcotic medication use and report the amount and frequency you use these medications when you come to your office visit.   We would also advise that if you are using narcotic medications, you should take a dose prior to therapy to maximize you participation.  IF YOU ARE ON NARCOTIC MEDICATIONS IT IS NOT PERMISSIBLE TO OPERATE A MOTOR VEHICLE (MOTORCYCLE/CAR/TRUCK/MOPED) OR HEAVY MACHINERY DO NOT MIX NARCOTICS WITH OTHER CNS (CENTRAL NERVOUS SYSTEM) DEPRESSANTS SUCH AS ALCOHOL       ICE AND ELEVATE INJURED/OPERATIVE EXTREMITY  Using ice and elevating the injured extremity above your heart can help with swelling and pain control.  Icing in a pulsatile fashion, such as 20  minutes on and 20 minutes off, can be followed.    Do not place ice directly on skin. Make sure there is a barrier between to skin and the ice pack.    Using frozen items such as frozen peas works well as the conform nicely to the are that needs to be iced.  USE AN ACE WRAP OR TED HOSE FOR SWELLING CONTROL  In addition to icing and elevation, Ace wraps or TED hose are used to help limit and resolve swelling.  It is recommended to use Ace wraps or TED hose until you are informed to stop.    When using Ace Wraps start the wrapping distally (farthest away from the body) and wrap proximally (closer to the body)   Example: If you had surgery on your leg or thing and you do not have a splint on, start the ace wrap at the toes and work your way up to the thigh        If you had surgery on your upper extremity and do not have a splint on, start the ace wrap at your fingers and work your way up to the upper arm  IF YOU ARE IN A SPLINT OR CAST DO NOT [place]   If your splint gets wet for any reason please contact the office immediately. You may shower in your splint or cast as long as you keep it dry.  This can be done by wrapping in a cast cover or garbage back (or  similar)  Do Not stick any thing down your splint or cast such as pencils, money, or hangers to try and scratch yourself with.  If you feel itchy take benadryl as prescribed on the bottle for itching  IF YOU ARE IN A CAM BOOT (BLACK BOOT)  You may remove boot periodically. Perform daily dressing changes as noted below.  Wash the liner of the boot regularly and wear a sock when wearing the boot. It is recommended that you sleep in the boot until told otherwise  CALL THE OFFICE WITH ANY QUESTIONS OR CONCERTS: [phone]     Discharge Pin Site Instructions  Dress pins daily with Kerlix roll starting on POD 2. Wrap the Kerlix so that it tamps the skin down around the pin-skin interface to prevent/limit motion of the skin relative to the pin.  (Pin-skin motion is the primary cause of pain and infection related to external fixator pin sites).  Remove any crust or coagulum that may obstruct drainage with a saline moistened gauze or soap and water.  After POD 3, if there is no discernable drainage on the pin site dressing, the interval for change can by increased to every other day.  You may shower with the fixator, cleaning all pin sites gently with soap and water.  If you have a surgical wound this needs to be completely dry and without drainage before showering.  The extremity can be lifted by the fixator to facilitate wound care and transfers.  Notify the office/Doctor if you experience increasing drainage, redness, or pain from a pin site, or if you notice purulent (thick, snot-like) drainage.  Discharge Wound Care Instructions  Do NOT apply any ointments, solutions or lotions to pin sites or surgical wounds.  These prevent needed drainage and even though solutions like hydrogen peroxide kill bacteria, they also damage cells lining the pin sites that help fight infection.  Applying lotions or ointments can keep the wounds moist and can cause them to  breakdown and open up as well. This can  increase the risk for infection. When in doubt call the office.  Surgical incisions should be dressed daily.  If any drainage is noted, use one layer of adaptic, then gauze, Kerlix, and an ace wrap.  Once the incision is completely dry and without drainage, it may be left open to air out.  Showering may begin 36-48 hours later.  Cleaning gently with soap and water.  Traumatic wounds should be dressed daily as well.    One layer of adaptic, gauze, Kerlix, then ace wrap.  The adaptic can be discontinued once the draining has ceased    If you have a wet to dry dressing: wet the gauze with saline the squeeze as much saline out so the gauze is moist (not soaking wet), place moistened gauze over wound, then place a dry gauze over the moist one, followed by Kerlix wrap, then ace wrap.   Discharge patient  As directed    Increase activity slowly as tolerated  As directed    Weight bearing as tolerated  As directed    Questions:     Laterality:     Extremity:         Medication List         acetaminophen 500 MG tablet  Commonly known as:  TYLENOL  Take 500-1,000 mg by mouth daily as needed for mild pain.     latanoprost 0.005 % ophthalmic solution  Commonly known as:  XALATAN  Place 1 drop into the right eye at bedtime.     oxyCODONE 5 MG immediate release tablet  Commonly known as:  ROXICODONE  Take 1-2 tablets (5-10 mg total) by mouth every 3 (three) hours as needed for breakthrough pain.     potassium chloride SA 20 MEQ tablet  Commonly known as:  K-DUR,KLOR-CON  Take 20 mEq by mouth 2 (two) times daily.     temazepam 15 MG capsule  Commonly known as:  RESTORIL  Take 15-30 mg by mouth at bedtime as needed for sleep.     traMADol 50 MG tablet  Commonly known as:  ULTRAM  Take 50 mg by mouth 3 (three) times daily as needed for moderate pain.     triamcinolone cream 0.1 %  Commonly known as:  KENALOG  Apply topically 2 (two) times daily.     triamterene-hydrochlorothiazide  37.5-25 MG per tablet  Commonly known as:  MAXZIDE-25  Take 1 tablet by mouth daily.     vitamin B-12 1000 MCG tablet  Commonly known as:  CYANOCOBALAMIN  Take 1,000 mcg by mouth daily.           Follow-up Information   Follow up with HANDY,MICHAEL H, MD. Schedule an appointment as soon as possible for a visit in 2 weeks.   Specialty:  Orthopedic Surgery   Contact information:   [address] [place] [address] [phone]       Discharge Instructions and Plan:  Patient has no formal restrictions Weight-bear as tolerated bilateral lower extremities Dressing changes starting in 2 days, instructions include discharge paperwork Patient will followup with orthopedics in 2 weeks for reevaluation and removal of sutures. The plan would be for total knee replacement to the right knee in June and possibly longer if the steroid injection provides her adequate relief. She will be referred out to a total joint specialist for this. Patient can resume prehospital diet Patient does not require any pharmacologic DVT prophylaxis as  she is ambulatory  Signed:  Jari Pigg, PA-C [facility] [phone] (P) 10/06/2013, 8:46 AM

## 2013-10-09 ENCOUNTER — Encounter (HOSPITAL_COMMUNITY): Payer: Self-pay | Admitting: Orthopedic Surgery

## 2013-10-09 ENCOUNTER — Telehealth: Payer: Self-pay | Admitting: *Deleted

## 2013-10-09 NOTE — Telephone Encounter (Signed)
Spoke with patient and she's doing ok she's now taking showers and changing bandages everyday

## 2013-10-09 NOTE — Telephone Encounter (Signed)
Okay, good to hear 

## 2013-10-09 NOTE — Telephone Encounter (Signed)
From: Viviana Simpler I              Sent: Friday October 06, 2013           Please check on her on Monday Had orthopedic surgery and was briefly in the hospital   ----- Message -----    From: Rozanna Box, MD    Sent: 10/06/2013   9:14 AM      To: Venia Carbon, MD                     Please check on her on Monday      Had orthopedic surgery and was briefly in the hospital      ----- Message -----         From: Viviana Simpler I         Sent: 10/06/2013   9:14 AM           To: Venia Carbon, MD

## 2013-10-31 DIAGNOSIS — IMO0002 Reserved for concepts with insufficient information to code with codable children: Secondary | ICD-10-CM | POA: Diagnosis not present

## 2013-10-31 DIAGNOSIS — M25569 Pain in unspecified knee: Secondary | ICD-10-CM | POA: Diagnosis not present

## 2013-10-31 DIAGNOSIS — M25469 Effusion, unspecified knee: Secondary | ICD-10-CM | POA: Diagnosis not present

## 2013-10-31 DIAGNOSIS — M1710 Unilateral primary osteoarthritis, unspecified knee: Secondary | ICD-10-CM | POA: Diagnosis not present

## 2013-11-03 ENCOUNTER — Encounter: Payer: Self-pay | Admitting: Internal Medicine

## 2013-11-03 ENCOUNTER — Ambulatory Visit (INDEPENDENT_AMBULATORY_CARE_PROVIDER_SITE_OTHER): Payer: Medicare Other | Admitting: Internal Medicine

## 2013-11-03 VITALS — BP 140/68 | HR 91 | Temp 98.3°F | Wt 281.0 lb

## 2013-11-03 DIAGNOSIS — F39 Unspecified mood [affective] disorder: Secondary | ICD-10-CM | POA: Insufficient documentation

## 2013-11-03 DIAGNOSIS — F329 Major depressive disorder, single episode, unspecified: Secondary | ICD-10-CM | POA: Diagnosis not present

## 2013-11-03 MED ORDER — FLUOXETINE HCL 10 MG PO CAPS: 10.0000 mg | ORAL_CAPSULE | Freq: Every day | ORAL | Status: DC

## 2013-11-03 NOTE — Progress Notes (Signed)
Pre visit review using our clinic review tool, if applicable. No additional management support is needed unless otherwise documented below in the visit note. 

## 2013-11-03 NOTE — Patient Instructions (Signed)
Please start the fluoxetine. You may feel a little jittery or anxious at first--but this should fade away. Stop if you have severe irritability or notice any suicidal thoughts.

## 2013-11-03 NOTE — Assessment & Plan Note (Signed)
She is not interested in trying counseling Discussed at length---she is concerned about meds but willing to try  Will try low dose fluoxetine-- 10mg 

## 2013-11-03 NOTE — Progress Notes (Signed)
Subjective:    Patient ID: Chelsea Keller, female    DOB: 02-18-1935, 79 y.o.   MRN: [id number]  HPI Had surgery earlier this month to remove plate from left thigh  Recent visit with Dr Chelsea Keller to discuss right TKR He told her she needs to lose 50# before doing this Still with psoriasis over the knee--so can't make incision over it  "I can't remember ever feeling this bad in my life" Wonders if she might be gluten sensitive Told her she didn't have symptoms of sprue Might benefit from [employer] to depression "I am not where I want to be....lonely here" --- despite living with daughter No friends like in Chelsea Keller think she is grieving Misses her "Chelsea Keller" from church No sig thoughts of death and no suicidal ideation Still not sleeping--off and on the temazepam  Current Outpatient Prescriptions on File Prior to Visit  Medication Sig Dispense Refill  . acetaminophen (TYLENOL) 500 MG tablet Take 500-1,000 mg by mouth daily as needed for mild pain.       Marland Kitchen latanoprost (XALATAN) 0.005 % ophthalmic solution Place 1 drop into the right eye at bedtime.       Marland Kitchen oxyCODONE (ROXICODONE) 5 MG immediate release tablet Take 1-2 tablets (5-10 mg total) by mouth every 3 (three) hours as needed for breakthrough pain.  30 tablet  0  . potassium chloride SA (K-DUR,KLOR-CON) 20 MEQ tablet Take 20 mEq by mouth 2 (two) times daily.      . temazepam (RESTORIL) 15 MG capsule Take 15-30 mg by mouth at bedtime as needed for sleep.      . traMADol (ULTRAM) 50 MG tablet Take 50 mg by mouth 3 (three) times daily as needed for moderate pain.      Marland Kitchen triamcinolone cream (KENALOG) 0.1 % Apply topically 2 (two) times daily.  45 g  3  . triamterene-hydrochlorothiazide (MAXZIDE-25) 37.5-25 MG per tablet Take 1 tablet by mouth daily.  90 tablet  3  . vitamin B-12 (CYANOCOBALAMIN) 1000 MCG tablet Take 1,000 mcg by mouth daily.       No current facility-administered medications on file prior to  visit.    Allergies  Allergen Reactions  . Codeine Sulfate Shortness Of Breath  . Celecoxib Other (See Comments)    Pt bled out  . Erythromycin Base Nausea And Vomiting    Past Medical History  Diagnosis Date  . Left ventricular hypertrophy   . Chronic venous insufficiency   . Hypertriglyceridemia   . Hypertension   . Arthritis   . Allergy   . Impaired fasting glucose   . Hx of colonic polyps   . Gastric ulcer   . Vitamin B12 deficiency   . Sleep disorder   . PONV (postoperative nausea and vomiting)   . Shortness of breath     due to weigh  . Blood dyscrasia     bleed freely, hard to stop  . Fibromyalgia     constant pain    Past Surgical History  Procedure Laterality Date  . Cholecystectomy    . Tonsillectomy    . External fixation wrist fracture  1985    left with pins  . External fixation ankle fracture      Fx.  left ankle-fixation with pins later removed sec to infection 1985  . Cataract extraction  10/2003    OD  . Abdominal hysterectomy    . Hemiarthroplasty shoulder fracture  06/2009  . Femur fracture surgery  06/2009  . Total knee arthroplasty  03/09    left  . Tonsillectomy    . Fracture surgery    . Eye surgery    . Joint replacement    . Colonoscopy w/ polypectomy    . Hardware removal Left 10/05/2013    Procedure: HARDWARE REMOVAL LEFT DISTAL FEMUR;  Surgeon: Rozanna Box, MD;  Location: [facility];  Service: Orthopedics;  Laterality: Left;  . Steriod injection Right 10/05/2013    Procedure: STEROID INJECTION;  Surgeon: Rozanna Box, MD;  Location: [facility];  Service: Orthopedics;  Laterality: Right;    Family History  Problem Relation Age of Onset  . Depression Mother   . Cancer Mother     uterine cancer  . Cancer Brother     prostate cancer  . Diabetes Maternal Aunt   . Arthritis Brother   . Asthma Brother     History   Social History  . Marital Status: Widowed    Spouse Name: N/A    Number of Children: 4  . Years of Education: N/A    Occupational History  . 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 Smoking status: Former Smoker -- 1.00 packs/day for 40 years    Types: Cigarettes    Quit date: 07/06/1993  . Smokeless tobacco: Never Used  . Alcohol Use: Yes     Comment: rare  . Drug Use: No  . Sexual Activity: Not on file   Other Topics Concern  . Not on file   Social History Narrative   No living will   Plans to do health care POA forms---wants daughter Chelsea Keller   Would accept resuscitation attempts---no prolonged ventilation   Absolutely no feeding tube   Review of Systems Appetite is still fine Weight stable or down slightly    Objective:   Physical Exam  Psychiatric:  Some depressed mood Appropriate affect Normal appearance and though processes          Assessment & Plan:

## 2013-11-22 ENCOUNTER — Encounter: Payer: Self-pay | Admitting: Internal Medicine

## 2013-11-22 ENCOUNTER — Ambulatory Visit (INDEPENDENT_AMBULATORY_CARE_PROVIDER_SITE_OTHER): Payer: Medicare Other | Admitting: Internal Medicine

## 2013-11-22 VITALS — BP 148/70 | HR 100 | Temp 98.2°F | Wt 281.0 lb

## 2013-11-22 DIAGNOSIS — L409 Psoriasis, unspecified: Secondary | ICD-10-CM

## 2013-11-22 DIAGNOSIS — L408 Other psoriasis: Secondary | ICD-10-CM | POA: Diagnosis not present

## 2013-11-22 DIAGNOSIS — F329 Major depressive disorder, single episode, unspecified: Secondary | ICD-10-CM | POA: Diagnosis not present

## 2013-11-22 MED ORDER — TEMAZEPAM 15 MG PO CAPS: 15.0000 mg | ORAL_CAPSULE | Freq: Every evening | ORAL | Status: DC | PRN

## 2013-11-22 NOTE — Progress Notes (Signed)
Subjective:    Patient ID: Chelsea Keller, female    DOB: 10-13-1934, 79 y.o.   MRN: [id number]  HPI Doing some better Not sure she likes the way she feels on the med--- "like I don't care...lethargic" Still tired Hasn't looked into the [place] yet  Aches and pains are better  Current Outpatient Prescriptions on File Prior to Visit  Medication Sig Dispense Refill  . acetaminophen (TYLENOL) 500 MG tablet Take 500-1,000 mg by mouth daily as needed for mild pain.       Marland Kitchen FLUoxetine (PROZAC) 10 MG capsule Take 1 capsule (10 mg total) by mouth daily.  30 capsule  3  . latanoprost (XALATAN) 0.005 % ophthalmic solution Place 1 drop into the right eye at bedtime.       Marland Kitchen oxyCODONE (ROXICODONE) 5 MG immediate release tablet Take 1-2 tablets (5-10 mg total) by mouth every 3 (three) hours as needed for breakthrough pain.  30 tablet  0  . potassium chloride SA (K-DUR,KLOR-CON) 20 MEQ tablet Take 20 mEq by mouth 2 (two) times daily.      . temazepam (RESTORIL) 15 MG capsule Take 15-30 mg by mouth at bedtime as needed for sleep.      . traMADol (ULTRAM) 50 MG tablet Take 50 mg by mouth 3 (three) times daily as needed for moderate pain.      Marland Kitchen triamcinolone cream (KENALOG) 0.1 % Apply topically 2 (two) times daily.  45 g  3  . triamterene-hydrochlorothiazide (MAXZIDE-25) 37.5-25 MG per tablet Take 1 tablet by mouth daily.  90 tablet  3  . vitamin B-12 (CYANOCOBALAMIN) 1000 MCG tablet Take 1,000 mcg by mouth daily.       No current facility-administered medications on file prior to visit.    Allergies  Allergen Reactions  . Codeine Sulfate Shortness Of Breath  . Celecoxib Other (See Comments)    Pt bled out  . Erythromycin Base Nausea And Vomiting    Past Medical History  Diagnosis Date  . Left ventricular hypertrophy   . Chronic venous insufficiency   . Hypertriglyceridemia   . Hypertension   . Arthritis   . Allergy   . Impaired fasting glucose   . Hx of colonic polyps   .  Gastric ulcer   . Vitamin B12 deficiency   . Sleep disorder   . PONV (postoperative nausea and vomiting)   . Shortness of breath     due to weigh  . Blood dyscrasia     bleed freely, hard to stop  . Fibromyalgia     constant pain    Past Surgical History  Procedure Laterality Date  . Cholecystectomy    . Tonsillectomy    . External fixation wrist fracture  1985    left with pins  . External fixation ankle fracture      Fx.  left ankle-fixation with pins later removed sec to infection 1985  . Cataract extraction  10/2003    OD  . Abdominal hysterectomy    . Hemiarthroplasty shoulder fracture  06/2009  . Femur fracture surgery  06/2009  . Total knee arthroplasty  03/09    left  . Tonsillectomy    . Fracture surgery    . Eye surgery    . Joint replacement    . Colonoscopy w/ polypectomy    . Hardware removal Left 10/05/2013    Procedure: HARDWARE REMOVAL LEFT DISTAL FEMUR;  Surgeon: Rozanna Box, MD;  Location: [facility];  Service: Orthopedics;  Laterality: Left;  . Steriod injection Right 10/05/2013    Procedure: STEROID INJECTION;  Surgeon: Rozanna Box, MD;  Location: [facility];  Service: Orthopedics;  Laterality: Right;    Family History  Problem Relation Age of Onset  . Depression Mother   . Cancer Mother     uterine cancer  . Cancer Brother     prostate cancer  . Diabetes Maternal Aunt   . Arthritis Brother   . Asthma Brother     History   Social History  . Marital Status: Widowed    Spouse Name: N/A    Number of Children: 4  . 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 Smoking status: Former Smoker -- 1.00 packs/day for 40 years    Types: Cigarettes    Quit date: 07/06/1993  . Smokeless tobacco: Never Used  . Alcohol Use: Yes     Comment: rare  . Drug Use: No  . Sexual Activity: Not on file   Other Topics Concern  . Not on file   Social History  Narrative   No living will   Plans to do health care POA forms---wants daughter [name]   Would accept resuscitation attempts---no prolonged ventilation   Absolutely no feeding tube   Review of Systems Still likes to stay up late and watch TV. Gets up late-- 11AM usually. Discussed getting out of bed at regular time Appetite is about the same--weight is stable    Objective:   Physical Exam  Psychiatric:  Brighter now Appropriate affect Speech is normal          Assessment & Plan:

## 2013-11-22 NOTE — Progress Notes (Signed)
Pre visit review using our clinic review tool, if applicable. No additional management support is needed unless otherwise documented below in the visit note. 

## 2013-11-22 NOTE — Assessment & Plan Note (Signed)
Inconsistent with steroid cream Does respond

## 2013-11-22 NOTE — Assessment & Plan Note (Signed)
Some improvement Feels different on the med but not clearly a side effect Will not increase med for now Recheck 2 months

## 2013-11-30 ENCOUNTER — Other Ambulatory Visit: Payer: Self-pay

## 2013-11-30 MED ORDER — FLUOXETINE HCL 10 MG PO CAPS: 10.0000 mg | ORAL_CAPSULE | Freq: Every day | ORAL | Status: DC

## 2013-11-30 NOTE — Telephone Encounter (Signed)
Pt left v/m [facility]; pt has refills available at local pharmacy; pt already has appt scheduled with Dr Silvio Pate 01/24/14. Pt presently has 3 pills. Pt request cb.

## 2013-11-30 NOTE — Telephone Encounter (Signed)
rx sent to pharmacy by e-script  

## 2014-01-18 ENCOUNTER — Other Ambulatory Visit: Payer: Self-pay | Admitting: Internal Medicine

## 2014-01-24 ENCOUNTER — Encounter: Payer: Self-pay | Admitting: Internal Medicine

## 2014-01-24 ENCOUNTER — Ambulatory Visit (INDEPENDENT_AMBULATORY_CARE_PROVIDER_SITE_OTHER): Payer: Medicare Other | Admitting: Internal Medicine

## 2014-01-24 VITALS — BP 140/70 | HR 102 | Temp 98.2°F | Wt 284.0 lb

## 2014-01-24 DIAGNOSIS — IMO0002 Reserved for concepts with insufficient information to code with codable children: Secondary | ICD-10-CM | POA: Diagnosis not present

## 2014-01-24 DIAGNOSIS — F324 Major depressive disorder, single episode, in partial remission: Secondary | ICD-10-CM

## 2014-01-24 NOTE — Assessment & Plan Note (Signed)
Finally seems to be in remission Will continue the medication for now Consider wean at next visit March 2016

## 2014-01-24 NOTE — Progress Notes (Signed)
Pre visit review using our clinic review tool, if applicable. No additional management support is needed unless otherwise documented below in the visit note. 

## 2014-01-24 NOTE — Progress Notes (Signed)
Subjective:    Patient ID: Chelsea Keller, female    DOB: 05-09-1935, 79 y.o.   MRN: [id number]  HPI Mood is clearly better Still stressed with living with daughter and her family---financially tied there for now Things are better there Daughter does make sure she gets out at times  Would like to travel--- [place] for friends, [place] for brother  Occasional severe arthritis pain Will rarely need oxycodone  Tends to dwell on the past Will bear a grudge Tries to work through this Current Outpatient Prescriptions on File Prior to Visit  Medication Sig Dispense Refill  . acetaminophen (TYLENOL) 500 MG tablet Take 500-1,000 mg by mouth daily as needed for mild pain.       Marland Kitchen FLUoxetine (PROZAC) 10 MG capsule Take 1 capsule (10 mg total) by mouth daily.  90 capsule  0  . latanoprost (XALATAN) 0.005 % ophthalmic solution Place 1 drop into the right eye at bedtime.       Marland Kitchen oxyCODONE (ROXICODONE) 5 MG immediate release tablet Take 1-2 tablets (5-10 mg total) by mouth every 3 (three) hours as needed for breakthrough pain.  30 tablet  0  . potassium chloride SA (K-DUR,KLOR-CON) 20 MEQ tablet Take 20 mEq by mouth 2 (two) times daily.      . temazepam (RESTORIL) 15 MG capsule Take 1-2 capsules (15-30 mg total) by mouth at bedtime as needed for sleep.  90 capsule  0  . traMADol (ULTRAM) 50 MG tablet Take 50 mg by mouth 3 (three) times daily as needed for moderate pain.      Marland Kitchen triamcinolone cream (KENALOG) 0.1 % Apply topically 2 (two) times daily.  45 g  3  . triamterene-hydrochlorothiazide (MAXZIDE-25) 37.5-25 MG per tablet TAKE 1 TABLET DAILY  90 tablet  1  . vitamin B-12 (CYANOCOBALAMIN) 1000 MCG tablet Take 1,000 mcg by mouth daily.       No current facility-administered medications on file prior to visit.    Allergies  Allergen Reactions  . Codeine Sulfate Shortness Of Breath  . Celecoxib Other (See Comments)    Pt bled out  . Erythromycin Base Nausea And Vomiting    Past Medical History   Diagnosis Date  . Left ventricular hypertrophy   . Chronic venous insufficiency   . Hypertriglyceridemia   . Hypertension   . Arthritis   . Allergy   . Impaired fasting glucose   . Hx of colonic polyps   . Gastric ulcer   . Vitamin B12 deficiency   . Sleep disorder   . PONV (postoperative nausea and vomiting)   . Shortness of breath     due to weigh  . Blood dyscrasia     bleed freely, hard to stop  . Fibromyalgia     constant pain    Past Surgical History  Procedure Laterality Date  . Cholecystectomy    . Tonsillectomy    . External fixation wrist fracture  1985    left with pins  . External fixation ankle fracture      Fx.  left ankle-fixation with pins later removed sec to infection 1985  . Cataract extraction  10/2003    OD  . Abdominal hysterectomy    . Hemiarthroplasty shoulder fracture  06/2009  . Femur fracture surgery  06/2009  . Total knee arthroplasty  03/09    left  . Tonsillectomy    . Fracture surgery    . Eye surgery    . Joint replacement    .  Colonoscopy w/ polypectomy    . Hardware removal Left 10/05/2013    Procedure: HARDWARE REMOVAL LEFT DISTAL FEMUR;  Surgeon: Rozanna Box, MD;  Location: [facility];  Service: Orthopedics;  Laterality: Left;  . Steriod injection Right 10/05/2013    Procedure: STEROID INJECTION;  Surgeon: Rozanna Box, MD;  Location: [facility];  Service: Orthopedics;  Laterality: Right;    Family History  Problem Relation Age of Onset  . Depression Mother   . Cancer Mother     uterine cancer  . Cancer Brother     prostate cancer  . Diabetes Maternal Aunt   . Arthritis Brother   . Asthma Brother     History   Social History  . Marital Status: Widowed    Spouse Name: N/A    Number of Children: 4  . 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 Smoking status: Former Smoker -- 1.00 packs/day for 40 years    Types:  Cigarettes    Quit date: 07/06/1993  . Smokeless tobacco: Never Used  . Alcohol Use: Yes     Comment: rare  . Drug Use: No  . Sexual Activity: Not on file   Other Topics Concern  . Not on file   Social History Narrative   No living will   Plans to do health care POA forms---wants daughter [name]   Would accept resuscitation attempts---no prolonged ventilation   Absolutely no feeding tube   Review of Systems Appetite is fine Weight is up 3# Sleeping fairly well--- does need the temazepam though. Did poorly for a while when off it Trying to take every other night-- uses tylenol at times    Objective:   Physical Exam  Constitutional: She appears well-developed and well-nourished. No distress.  Psychiatric:  Smiling Mood is good Appropriate affect          Assessment & Plan:

## 2014-02-09 ENCOUNTER — Other Ambulatory Visit: Payer: Self-pay | Admitting: Internal Medicine

## 2014-03-07 ENCOUNTER — Ambulatory Visit: Payer: Medicare Other | Admitting: Internal Medicine

## 2014-03-10 ENCOUNTER — Encounter: Payer: Self-pay | Admitting: Family Medicine

## 2014-03-10 ENCOUNTER — Ambulatory Visit (INDEPENDENT_AMBULATORY_CARE_PROVIDER_SITE_OTHER): Payer: Medicare Other | Admitting: Family Medicine

## 2014-03-10 VITALS — BP 120/60 | HR 104 | Temp 98.1°F | Ht 62.0 in | Wt 283.5 lb

## 2014-03-10 DIAGNOSIS — L02419 Cutaneous abscess of limb, unspecified: Secondary | ICD-10-CM

## 2014-03-10 DIAGNOSIS — R609 Edema, unspecified: Secondary | ICD-10-CM

## 2014-03-10 DIAGNOSIS — R7301 Impaired fasting glucose: Secondary | ICD-10-CM | POA: Diagnosis not present

## 2014-03-10 DIAGNOSIS — L03119 Cellulitis of unspecified part of limb: Secondary | ICD-10-CM

## 2014-03-10 DIAGNOSIS — I872 Venous insufficiency (chronic) (peripheral): Secondary | ICD-10-CM

## 2014-03-10 DIAGNOSIS — L03116 Cellulitis of left lower limb: Secondary | ICD-10-CM | POA: Insufficient documentation

## 2014-03-10 DIAGNOSIS — Z6841 Body Mass Index (BMI) 40.0 and over, adult: Secondary | ICD-10-CM

## 2014-03-10 LAB — GLUCOSE, FINGERSTICK (STAT): GLUCOSE, FINGERSTICK: 186

## 2014-03-10 MED ORDER — CEPHALEXIN 500 MG PO CAPS: 500.0000 mg | ORAL_CAPSULE | Freq: Four times a day (QID) | ORAL | Status: DC

## 2014-03-10 NOTE — Progress Notes (Signed)
   Subjective:    Patient ID: Chelsea Keller, female    DOB: Aug 24, 1934, 79 y.o.   MRN: [id number]  HPI Chelsea Keller is a 79 year old female who comes into the Saturday clinic accompanied by her daughter for evaluation of a infection in her left second toe  Patient has a history of underlying, glucose intolerance,,,... Blood sugar today 186.... Obesity venous insufficiency  She traumatized the great toe 3 days ago and noticed some swelling and redness in the second toe 2 days ago.   Review of Systems    no previous history of infection or trauma of her feet Objective:   Physical Exam Well-developed well-nourished female morbidly obese 283 pounds 5 foot 2 inches tall. Both legs are swollen venous insufficiency. Trauma to the left great nail  with a subungual hematoma.  There is redness of the dorsum of the second toe. No pus       Assessment & Plan:  Cellulitis left second toe in the setting of underlying obesity venous insufficiency and diabetes...Marland KitchenMarland Kitchen Plan....... Strict diet, elevation of her left foot, oral antibiotics, followup with Dr. Carlean Jews. On Tuesday

## 2014-03-10 NOTE — Progress Notes (Signed)
Pre visit review using our clinic review tool, if applicable. No additional management support is needed unless otherwise documented below in the visit note. 

## 2014-03-10 NOTE — Patient Instructions (Signed)
Rest at home  Elevation of your left foot  Keflex 500 mg..... 2 twice daily  Call and see your doctor on Tuesday for followup  Strict diet....... No sugar no fat

## 2014-03-14 ENCOUNTER — Encounter: Payer: Self-pay | Admitting: Internal Medicine

## 2014-03-14 ENCOUNTER — Ambulatory Visit (INDEPENDENT_AMBULATORY_CARE_PROVIDER_SITE_OTHER): Payer: Medicare Other | Admitting: Internal Medicine

## 2014-03-14 VITALS — HR 78 | Temp 97.8°F | Wt 278.0 lb

## 2014-03-14 DIAGNOSIS — L02419 Cutaneous abscess of limb, unspecified: Secondary | ICD-10-CM

## 2014-03-14 DIAGNOSIS — L03116 Cellulitis of left lower limb: Secondary | ICD-10-CM

## 2014-03-14 DIAGNOSIS — L03119 Cellulitis of unspecified part of limb: Secondary | ICD-10-CM

## 2014-03-14 MED ORDER — TEMAZEPAM 15 MG PO CAPS: 15.0000 mg | ORAL_CAPSULE | Freq: Every evening | ORAL | Status: DC | PRN

## 2014-03-14 MED ORDER — LEVOFLOXACIN 500 MG PO TABS: 500.0000 mg | ORAL_TABLET | Freq: Every day | ORAL | Status: DC

## 2014-03-14 NOTE — Progress Notes (Signed)
Pre visit review using our clinic review tool, if applicable. No additional management support is needed unless otherwise documented below in the visit note. 

## 2014-03-14 NOTE — Assessment & Plan Note (Signed)
Not better Will change to levaquin Consider adding augmentin if it doesn't respond X-ray to check bones if persists

## 2014-03-14 NOTE — Progress Notes (Signed)
Subjective:    Patient ID: Chelsea Keller, female    DOB: April 18, 1935, 79 y.o.   MRN: [id number]  HPI Here with daughter  Reviewed the note The lead (line) for dog got caught under right great toe about 3 weeks ago Nail damaged 2nd toe started getting painful and red about 1 week ago No fever No chills or sweats  Redness may be spreading to great toe also Some bleeding at great toe nail (that was damaged in incident)  Current Outpatient Prescriptions on File Prior to Visit  Medication Sig Dispense Refill  . acetaminophen (TYLENOL) 500 MG tablet Take 500-1,000 mg by mouth daily as needed for mild pain.       . cephALEXin (KEFLEX) 500 MG capsule Take 1 capsule (500 mg total) by mouth 4 (four) times daily.  40 capsule  1  . FLUoxetine (PROZAC) 10 MG capsule TAKE 1 CAPSULE DAILY  90 capsule  0  . latanoprost (XALATAN) 0.005 % ophthalmic solution Place 1 drop into the right eye at bedtime.       Marland Kitchen oxyCODONE (ROXICODONE) 5 MG immediate release tablet Take 1-2 tablets (5-10 mg total) by mouth every 3 (three) hours as needed for breakthrough pain.  30 tablet  0  . potassium chloride SA (K-DUR,KLOR-CON) 20 MEQ tablet Take 20 mEq by mouth 2 (two) times daily.      . temazepam (RESTORIL) 15 MG capsule Take 1-2 capsules (15-30 mg total) by mouth at bedtime as needed for sleep.  90 capsule  0  . traMADol (ULTRAM) 50 MG tablet Take 50 mg by mouth 3 (three) times daily as needed for moderate pain.      Marland Kitchen triamterene-hydrochlorothiazide (MAXZIDE-25) 37.5-25 MG per tablet TAKE 1 TABLET DAILY  90 tablet  1  . vitamin B-12 (CYANOCOBALAMIN) 1000 MCG tablet Take 1,000 mcg by mouth daily.       No current facility-administered medications on file prior to visit.    Allergies  Allergen Reactions  . Codeine Sulfate Shortness Of Breath  . Celecoxib Other (See Comments)    Pt bled out  . Erythromycin Base Nausea And Vomiting    Past Medical History  Diagnosis Date  . Left ventricular hypertrophy     . Chronic venous insufficiency   . Hypertriglyceridemia   . Hypertension   . Arthritis   . Allergy   . Impaired fasting glucose   . Hx of colonic polyps   . Gastric ulcer   . Vitamin B12 deficiency   . Sleep disorder   . PONV (postoperative nausea and vomiting)   . Shortness of breath     due to weigh  . Blood dyscrasia     bleed freely, hard to stop  . Fibromyalgia     constant pain    Past Surgical History  Procedure Laterality Date  . Cholecystectomy    . Tonsillectomy    . External fixation wrist fracture  1985    left with pins  . External fixation ankle fracture      Fx.  left ankle-fixation with pins later removed sec to infection 1985  . Cataract extraction  10/2003    OD  . Abdominal hysterectomy    . Hemiarthroplasty shoulder fracture  06/2009  . Femur fracture surgery  06/2009  . Total knee arthroplasty  03/09    left  . Tonsillectomy    . Fracture surgery    . Eye surgery    . Joint replacement    . Colonoscopy  w/ polypectomy    . Hardware removal Left 10/05/2013    Procedure: HARDWARE REMOVAL LEFT DISTAL FEMUR;  Surgeon: Rozanna Box, MD;  Location: [facility];  Service: Orthopedics;  Laterality: Left;  . Steriod injection Right 10/05/2013    Procedure: STEROID INJECTION;  Surgeon: Rozanna Box, MD;  Location: [facility];  Service: Orthopedics;  Laterality: Right;    Family History  Problem Relation Age of Onset  . Depression Mother   . Cancer Mother     uterine cancer  . Cancer Brother     prostate cancer  . Diabetes Maternal Aunt   . Arthritis Brother   . Asthma Brother     History   Social History  . Marital Status: Widowed    Spouse Name: N/A    Number of Children: 4  . 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 Smoking status: Former Smoker -- 1.00 packs/day for 40 years    Types: Cigarettes    Quit date: 07/06/1993  . Smokeless  tobacco: Never Used  . Alcohol Use: Yes     Comment: rare  . Drug Use: No  . Sexual Activity: Not on file   Other Topics Concern  . Not on file   Social History Narrative   No living will   Plans to do health care POA forms---wants daughter Chelsea Keller   Would accept resuscitation attempts---no prolonged ventilation   Absolutely no feeding tube   Review of Systems Some loose stools Appetite is fine-- has tried to improve her eating     Objective:   Physical Exam  Constitutional: She appears well-developed and well-nourished. No distress.  Skin:  Left 2nd toe is red and warm. Mildly tender Some pus under left great toenail but not really inflamed          Assessment & Plan:

## 2014-03-20 ENCOUNTER — Telehealth: Payer: Self-pay

## 2014-03-20 NOTE — Telephone Encounter (Signed)
Pt left v/m; pt said toe is doing better but not completely cleared up; definite improvement and pt is almost out of med. Pt request cb.Please advise. [facility]

## 2014-03-21 MED ORDER — LEVOFLOXACIN 500 MG PO TABS: 500.0000 mg | ORAL_TABLET | Freq: Every day | ORAL | Status: DC

## 2014-03-21 NOTE — Telephone Encounter (Signed)
She wasn't on prednisone, she was on Levaquin extend that one? Or just send in prednisone?

## 2014-03-21 NOTE — Telephone Encounter (Signed)
Spoke with patient and advised results rx sent to pharmacy by e-script appt scheduled 

## 2014-03-21 NOTE — Telephone Encounter (Signed)
Have her extend the prednisone at 20mg  daily for another 10 days #10 x 0 Have her set up another appt next week for me to recheck and decide on ongoing Rx

## 2014-03-21 NOTE — Telephone Encounter (Signed)
Sorry, got her mixed up with similar situation where there was gout also Go ahead and give another #7 of the levaquin Set her up for follow up next week

## 2014-03-27 ENCOUNTER — Ambulatory Visit (INDEPENDENT_AMBULATORY_CARE_PROVIDER_SITE_OTHER): Payer: Medicare Other | Admitting: Internal Medicine

## 2014-03-27 ENCOUNTER — Encounter: Payer: Self-pay | Admitting: Internal Medicine

## 2014-03-27 VITALS — BP 140/51 | HR 83 | Temp 97.5°F | Wt 283.0 lb

## 2014-03-27 DIAGNOSIS — L03119 Cellulitis of unspecified part of limb: Secondary | ICD-10-CM | POA: Diagnosis not present

## 2014-03-27 DIAGNOSIS — F320 Major depressive disorder, single episode, mild: Secondary | ICD-10-CM

## 2014-03-27 DIAGNOSIS — L02419 Cutaneous abscess of limb, unspecified: Secondary | ICD-10-CM | POA: Diagnosis not present

## 2014-03-27 DIAGNOSIS — L03116 Cellulitis of left lower limb: Secondary | ICD-10-CM

## 2014-03-27 NOTE — Progress Notes (Signed)
Subjective:    Patient ID: Chelsea Keller, female    DOB: 01-Aug-1934, 79 y.o.   MRN: [id number]  HPI Finally better Had to take an extra week of the levaquin  2nd toe still hurts at times--sharp pain when putting weight on it Great toe no longer has pus No pain  No fever  Stopped fluoxetine about 4 days ago Wasn't feeling right More energy now and feels better Not feeling depressed regularly--more like mild dissatisfaction with things Unhappy about her lack of energy  Current Outpatient Prescriptions on File Prior to Visit  Medication Sig Dispense Refill  . acetaminophen (TYLENOL) 500 MG tablet Take 500-1,000 mg by mouth daily as needed for mild pain.       Marland Kitchen FLUoxetine (PROZAC) 10 MG capsule TAKE 1 CAPSULE DAILY  90 capsule  0  . latanoprost (XALATAN) 0.005 % ophthalmic solution Place 1 drop into the right eye at bedtime.       Marland Kitchen oxyCODONE (ROXICODONE) 5 MG immediate release tablet Take 1-2 tablets (5-10 mg total) by mouth every 3 (three) hours as needed for breakthrough pain.  30 tablet  0  . potassium chloride SA (K-DUR,KLOR-CON) 20 MEQ tablet Take 20 mEq by mouth 2 (two) times daily.      . temazepam (RESTORIL) 15 MG capsule Take 1-2 capsules (15-30 mg total) by mouth at bedtime as needed for sleep.  90 capsule  0  . traMADol (ULTRAM) 50 MG tablet Take 50 mg by mouth 3 (three) times daily as needed for moderate pain.      Marland Kitchen triamterene-hydrochlorothiazide (MAXZIDE-25) 37.5-25 MG per tablet TAKE 1 TABLET DAILY  90 tablet  1  . vitamin B-12 (CYANOCOBALAMIN) 1000 MCG tablet Take 1,000 mcg by mouth daily.       No current facility-administered medications on file prior to visit.    Allergies  Allergen Reactions  . Codeine Sulfate Shortness Of Breath  . Celecoxib Other (See Comments)    Pt bled out  . Erythromycin Base Nausea And Vomiting    Past Medical History  Diagnosis Date  . Left ventricular hypertrophy   . Chronic venous insufficiency   . Hypertriglyceridemia     . Hypertension   . Arthritis   . Allergy   . Impaired fasting glucose   . Hx of colonic polyps   . Gastric ulcer   . Vitamin B12 deficiency   . Sleep disorder   . PONV (postoperative nausea and vomiting)   . Shortness of breath     due to weigh  . Blood dyscrasia     bleed freely, hard to stop  . Fibromyalgia     constant pain    Past Surgical History  Procedure Laterality Date  . Cholecystectomy    . Tonsillectomy    . External fixation wrist fracture  1985    left with pins  . External fixation ankle fracture      Fx.  left ankle-fixation with pins later removed sec to infection 1985  . Cataract extraction  10/2003    OD  . Abdominal hysterectomy    . Hemiarthroplasty shoulder fracture  06/2009  . Femur fracture surgery  06/2009  . Total knee arthroplasty  03/09    left  . Tonsillectomy    . Fracture surgery    . Eye surgery    . Joint replacement    . Colonoscopy w/ polypectomy    . Hardware removal Left 10/05/2013    Procedure: HARDWARE REMOVAL LEFT DISTAL FEMUR;  Surgeon: Rozanna Box, MD;  Location: [facility];  Service: Orthopedics;  Laterality: Left;  . Steriod injection Right 10/05/2013    Procedure: STEROID INJECTION;  Surgeon: Rozanna Box, MD;  Location: [facility];  Service: Orthopedics;  Laterality: Right;    Family History  Problem Relation Age of Onset  . Depression Mother   . Cancer Mother     uterine cancer  . Cancer Brother     prostate cancer  . Diabetes Maternal Aunt   . Arthritis Brother   . Asthma Brother     History   Social History  . Marital Status: Widowed    Spouse Name: N/A    Number of Children: 4  . 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 Smoking status: Former Smoker -- 1.00 packs/day for 40 years    Types: Cigarettes    Quit date: 07/06/1993  . Smokeless tobacco: Never Used  . Alcohol Use: Yes     Comment: rare  .  Drug Use: No  . Sexual Activity: Not on file   Other Topics Concern  . Not on file   Social History Narrative   No living will   Plans to do health care POA forms---wants daughter [name]   Would accept resuscitation attempts---no prolonged ventilation   Absolutely no feeding tube   Review of Systems Did have mild loose stools with the antibiotic Appetite is okay      Objective:   Physical Exam  Skin:  Fungal nail bed in left 1st toenail but no inflammation, pus or tenderness 2nd toe is normal  Psychiatric: She has a normal mood and affect. Her behavior is normal.          Assessment & Plan:

## 2014-03-27 NOTE — Progress Notes (Signed)
Pre visit review using our clinic review tool, if applicable. No additional management support is needed unless otherwise documented below in the visit note. 

## 2014-03-27 NOTE — Assessment & Plan Note (Signed)
Resolved Will just observe

## 2014-03-27 NOTE — Assessment & Plan Note (Signed)
Still ruminates about husband's infidelity around 1979---wonders if he really loved her, etc Now he is gone The MDD symptoms are better---more like dysthymia now She stopped the fluoxetine (sooner than I would have recommended)--- we will just see how she does without it at this point

## 2014-04-20 ENCOUNTER — Other Ambulatory Visit: Payer: Self-pay

## 2014-04-21 ENCOUNTER — Other Ambulatory Visit: Payer: Self-pay | Admitting: Internal Medicine

## 2014-05-09 ENCOUNTER — Ambulatory Visit (INDEPENDENT_AMBULATORY_CARE_PROVIDER_SITE_OTHER): Payer: Medicare Other | Admitting: Internal Medicine

## 2014-05-09 ENCOUNTER — Ambulatory Visit (INDEPENDENT_AMBULATORY_CARE_PROVIDER_SITE_OTHER)
Admission: RE | Admit: 2014-05-09 | Discharge: 2014-05-09 | Disposition: A | Discharge status: Home / Self Care | Payer: Medicare Other | Source: Ambulatory Visit | Attending: Internal Medicine | Admitting: Internal Medicine

## 2014-05-09 ENCOUNTER — Encounter: Payer: Self-pay | Admitting: Internal Medicine

## 2014-05-09 VITALS — BP 132/68 | HR 87 | Temp 97.9°F | Resp 18 | Wt 290.8 lb

## 2014-05-09 DIAGNOSIS — R918 Other nonspecific abnormal finding of lung field: Secondary | ICD-10-CM | POA: Diagnosis not present

## 2014-05-09 DIAGNOSIS — R0609 Other forms of dyspnea: Secondary | ICD-10-CM | POA: Insufficient documentation

## 2014-05-09 DIAGNOSIS — I5032 Chronic diastolic (congestive) heart failure: Secondary | ICD-10-CM | POA: Insufficient documentation

## 2014-05-09 DIAGNOSIS — I5031 Acute diastolic (congestive) heart failure: Secondary | ICD-10-CM

## 2014-05-09 DIAGNOSIS — R0600 Dyspnea, unspecified: Secondary | ICD-10-CM | POA: Diagnosis not present

## 2014-05-09 NOTE — Patient Instructions (Signed)
Restart the furosemide 40mg  daily

## 2014-05-09 NOTE — Assessment & Plan Note (Addendum)
Subacute over 3 weeks or so No fever or clear signs of infection EKG doesn't show ischemia CXR doesn't show infection --- will await official read

## 2014-05-09 NOTE — Assessment & Plan Note (Signed)
2010 echo showed normal EF and no signs of ischemia now I don't think her symptoms are pulmonary Will have her restart the furosemide 40 daily I will recheck her in 2 weeks but she should check back in with Dr Angelena Form

## 2014-05-09 NOTE — Progress Notes (Signed)
Subjective:    Patient ID: Chelsea Keller, female    DOB: 02/18/1935, 79 y.o.   MRN: [id number]  HPI Here with daughter "I can't breathe"  SOB just walking a few feet Started around 3 weeks ago Some congestion in chest and wheezing--nothing new No fever  Gets a brief pain along lower sternum that goes to back ("like a dot") Only with cough  Feels her leg swelling is worse Does prop herself on pillows at night No PND Doesn't sleep soundly--will sleep 2 hours, then is "up" Hasn't taken the lasix, that Dr Angelena Form gave her, in some time  Current Outpatient Prescriptions on File Prior to Visit  Medication Sig Dispense Refill  . acetaminophen (TYLENOL) 500 MG tablet Take 500-1,000 mg by mouth daily as needed for mild pain.     Marland Kitchen KLOR-CON M20 20 MEQ tablet TAKE 1 TABLET TWICE A DAY 180 tablet 2  . latanoprost (XALATAN) 0.005 % ophthalmic solution Place 1 drop into the right eye at bedtime.     Marland Kitchen oxyCODONE (ROXICODONE) 5 MG immediate release tablet Take 1-2 tablets (5-10 mg total) by mouth every 3 (three) hours as needed for breakthrough pain. 30 tablet 0  . temazepam (RESTORIL) 15 MG capsule Take 1-2 capsules (15-30 mg total) by mouth at bedtime as needed for sleep. 90 capsule 0  . traMADol (ULTRAM) 50 MG tablet Take 50 mg by mouth 3 (three) times daily as needed for moderate pain.    Marland Kitchen triamterene-hydrochlorothiazide (MAXZIDE-25) 37.5-25 MG per tablet TAKE 1 TABLET DAILY 90 tablet 1  . vitamin B-12 (CYANOCOBALAMIN) 1000 MCG tablet Take 1,000 mcg by mouth daily.     No current facility-administered medications on file prior to visit.    Allergies  Allergen Reactions  . Codeine Sulfate Shortness Of Breath  . Celecoxib Other (See Comments)    Pt bled out  . Erythromycin Base Nausea And Vomiting    Past Medical History  Diagnosis Date  . Left ventricular hypertrophy   . Chronic venous insufficiency   . Hypertriglyceridemia   . Hypertension   . Arthritis   . Allergy   .  Impaired fasting glucose   . Hx of colonic polyps   . Gastric ulcer   . Vitamin B12 deficiency   . Sleep disorder   . PONV (postoperative nausea and vomiting)   . Shortness of breath     due to weigh  . Blood dyscrasia     bleed freely, hard to stop  . Fibromyalgia     constant pain    Past Surgical History  Procedure Laterality Date  . Cholecystectomy    . Tonsillectomy    . External fixation wrist fracture  1985    left with pins  . External fixation ankle fracture      Fx.  left ankle-fixation with pins later removed sec to infection 1985  . Cataract extraction  10/2003    OD  . Abdominal hysterectomy    . Hemiarthroplasty shoulder fracture  06/2009  . Femur fracture surgery  06/2009  . Total knee arthroplasty  03/09    left  . Tonsillectomy    . Fracture surgery    . Eye surgery    . Joint replacement    . Colonoscopy w/ polypectomy    . Hardware removal Left 10/05/2013    Procedure: HARDWARE REMOVAL LEFT DISTAL FEMUR;  Surgeon: Rozanna Box, MD;  Location: [facility];  Service: Orthopedics;  Laterality: Left;  . Steriod injection Right  10/05/2013    Procedure: STEROID INJECTION;  Surgeon: Rozanna Box, MD;  Location: [facility];  Service: Orthopedics;  Laterality: Right;    Family History  Problem Relation Age of Onset  . Depression Mother   . Cancer Mother     uterine cancer  . Cancer Brother     prostate cancer  . Diabetes Maternal Aunt   . Arthritis Brother   . Asthma Brother     History   Social History  . Marital Status: Widowed    Spouse Name: N/A    Number of Children: 4  . 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 Smoking status: Former Smoker -- 1.00 packs/day for 40 years    Types: Cigarettes    Quit date: 07/06/1993  . Smokeless tobacco: Never Used  . Alcohol Use: Yes     Comment: rare  . Drug Use: No  . Sexual Activity: Not on file    Other Topics Concern  . Not on file   Social History Narrative   No living will   Plans to do health care POA forms---wants daughter Chelsea Keller   Would accept resuscitation attempts---no prolonged ventilation   Absolutely no feeding tube   Review of Systems No vomiting or diarrhea No acid reflux symptoms--but some nausea Still eating okay---weight is up some more    Objective:   Physical Exam  Constitutional: She appears well-developed. No distress.  Neck: Normal range of motion. Neck supple. No thyromegaly present.  Cardiovascular: Normal rate, regular rhythm and normal heart sounds.  Exam reveals no gallop.   No murmur heard. Pulmonary/Chest: Effort normal and breath sounds normal. No respiratory distress. She has no wheezes. She has no rales.  No dullness  Musculoskeletal:  Loose left great toenail removed without incident  3+ tense edema in calves No pitting  Lymphadenopathy:    She has no cervical adenopathy.  Psychiatric: She has a normal mood and affect. Her behavior is normal.          Assessment & Plan:

## 2014-05-09 NOTE — Progress Notes (Signed)
Pre visit review using our clinic review tool, if applicable. No additional management support is needed unless otherwise documented below in the visit note. 

## 2014-05-11 ENCOUNTER — Telehealth: Payer: Self-pay | Admitting: *Deleted

## 2014-05-11 ENCOUNTER — Other Ambulatory Visit: Payer: Self-pay | Admitting: *Deleted

## 2014-05-11 NOTE — Telephone Encounter (Signed)
Received a refill from [facility] for fluoxetine. Med was removed from med list on 05/09/14 with a note that pt had not taken med in over 30 days. I called pt for verification, and she said she is no longer taken med. Refill request denied electronically.

## 2014-05-11 NOTE — Telephone Encounter (Signed)
Received message from Dr. Angelena Form requesting I call pt to see how she is feeling.  Pt recently saw Dr. Silvio Pate with complaints of shortness of breath.  I placed call to pt and left message to call back.

## 2014-05-16 ENCOUNTER — Other Ambulatory Visit: Payer: Self-pay | Admitting: *Deleted

## 2014-05-16 NOTE — Telephone Encounter (Signed)
Ok to fill? Wasn't on active med list. Please advise

## 2014-05-16 NOTE — Telephone Encounter (Signed)
.  left message to have patient return my call.  

## 2014-05-16 NOTE — Telephone Encounter (Signed)
She told me she stopped it a few months ago If she restarted, it is okay to refill x 1 year

## 2014-05-16 NOTE — Telephone Encounter (Signed)
Spoke with patient and she does not take this medication anymore

## 2014-05-22 ENCOUNTER — Telehealth: Payer: Self-pay | Admitting: Cardiovascular Disease

## 2014-05-22 NOTE — Telephone Encounter (Signed)
Left another message for pt to call office.

## 2014-05-22 NOTE — Telephone Encounter (Signed)
Spoke with pt. She reports shortness of breath has improved some but is not back to normal. She is following up with Dr. Silvio Pate tomorrow. She is past due for follow up with Dr. Angelena Form and would like to schedule appt.  Dr. Angelena Form does not have any openings until January. I offered pt earlier appt with PA but she would like to wait until January to see Dr. Angelena Form.  She is unable to schedule at this time and will check her schedule and call us tomorrow to make appt.

## 2014-05-23 ENCOUNTER — Ambulatory Visit (INDEPENDENT_AMBULATORY_CARE_PROVIDER_SITE_OTHER): Payer: Medicare Other | Admitting: Internal Medicine

## 2014-05-23 ENCOUNTER — Encounter: Payer: Self-pay | Admitting: Internal Medicine

## 2014-05-23 VITALS — BP 140/60 | HR 96 | Temp 97.7°F | Wt 282.0 lb

## 2014-05-23 DIAGNOSIS — I5032 Chronic diastolic (congestive) heart failure: Secondary | ICD-10-CM | POA: Diagnosis not present

## 2014-05-23 LAB — RENAL FUNCTION PANEL
Albumin: 3.5 g/dL (ref 3.5–5.2)
BUN: 16 mg/dL (ref 6–23)
CHLORIDE: 105 meq/L (ref 96–112)
CO2: 28 meq/L (ref 19–32)
Calcium: 8.8 mg/dL (ref 8.4–10.5)
Creatinine, Ser: 0.9 mg/dL (ref 0.4–1.2)
GFR: 62.82 mL/min (ref >60.00)
Glucose, Bld: 113 mg/dL — ABNORMAL HIGH (ref 70–99)
Phosphorus: 3.5 mg/dL (ref 2.3–4.6)
Potassium: 4.1 mEq/L (ref 3.5–5.1)
Sodium: 141 mEq/L (ref 135–145)
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
## 2014-05-23 NOTE — Progress Notes (Signed)
Pre visit review using our clinic review tool, if applicable. No additional management support is needed unless otherwise documented below in the visit note. 

## 2014-05-23 NOTE — Patient Instructions (Addendum)
Please check your weight daily and be sure to take the furosemide if you are up more than 3-4 pounds from your usual.  Low-Sodium Eating Plan Sodium raises blood pressure and causes water to be held in the body. Getting less sodium from food will help lower your blood pressure, reduce any swelling, and protect your heart, liver, and kidneys. We get sodium by adding salt (sodium chloride) to food. Most of our sodium comes from canned, boxed, and frozen foods. Restaurant foods, fast foods, and pizza are also very high in sodium. Even if you take medicine to lower your blood pressure or to reduce fluid in your body, getting less sodium from your food is important. WHAT IS MY PLAN? Most people should limit their sodium intake to 2,300 mg a day. Your health care provider recommends that you limit your sodium intake to __________ a day.  WHAT DO I NEED TO KNOW ABOUT THIS EATING PLAN? For the low-sodium eating plan, you will follow these general guidelines:  Choose foods with a % Daily Value for sodium of less than 5% (as listed on the food label).   Use salt-free seasonings or herbs instead of table salt or sea salt.   Check with your health care provider or pharmacist before using salt substitutes.   Eat fresh foods.  Eat more vegetables and fruits.  Limit canned vegetables. If you do use them, rinse them well to decrease the sodium.   Limit cheese to 1 oz (28 g) per day.   Eat lower-sodium products, often labeled as "lower sodium" or "no salt added."  Avoid foods that contain monosodium glutamate (MSG). MSG is sometimes added to [place] food and some canned foods.  Check food labels (Nutrition Facts labels) on foods to learn how much sodium is in one serving.  Eat more home-cooked food and less restaurant, buffet, and fast food.  When eating at a restaurant, ask that your food be prepared with less salt or none, if possible.  HOW DO I READ FOOD LABELS FOR SODIUM  INFORMATION? The Nutrition Facts label lists the amount of sodium in one serving of the food. If you eat more than one serving, you must multiply the listed amount of sodium by the number of servings. Food labels may also identify foods as:  Sodium free--Less than 5 mg in a serving.  Very low sodium--35 mg or less in a serving.  Low sodium--140 mg or less in a serving.  Light in sodium--50% less sodium in a serving. For example, if a food that usually has 300 mg of sodium is changed to become light in sodium, it will have 150 mg of sodium.  Reduced sodium--25% less sodium in a serving. For example, if a food that usually has 400 mg of sodium is changed to reduced sodium, it will have 300 mg of sodium. WHAT FOODS CAN I EAT? Grains Low-sodium cereals, including oats, puffed wheat and rice, and shredded wheat cereals. Low-sodium crackers. Unsalted rice and pasta. Lower-sodium bread.  Vegetables Frozen or fresh vegetables. Low-sodium or reduced-sodium canned vegetables. Low-sodium or reduced-sodium tomato sauce and paste. Low-sodium or reduced-sodium tomato and vegetable juices.  Fruits Fresh, frozen, and canned fruit. Fruit juice.  Meat and Other Protein Products Low-sodium canned tuna and salmon. Fresh or frozen meat, poultry, seafood, and fish. Lamb. Unsalted nuts. Dried beans, peas, and lentils without added salt. Unsalted canned beans. Homemade soups without salt. Eggs.  Dairy Milk. Soy milk. Ricotta cheese. Low-sodium or reduced-sodium cheeses. Yogurt.  Condiments  Fresh and dried herbs and spices. Salt-free seasonings. Onion and garlic powders. Low-sodium varieties of mustard and ketchup. Lemon juice.  Fats and Oils Reduced-sodium salad dressings. Unsalted butter.  Other Unsalted popcorn and pretzels.  The items listed above may not be a complete list of recommended foods or beverages. Contact your dietitian for more options. WHAT FOODS ARE NOT  RECOMMENDED? Grains Instant hot cereals. Bread stuffing, pancake, and biscuit mixes. Croutons. Seasoned rice or pasta mixes. Noodle soup cups. Boxed or frozen macaroni and cheese. Self-rising flour. Regular salted crackers. Vegetables Regular canned vegetables. Regular canned tomato sauce and paste. Regular tomato and vegetable juices. Frozen vegetables in sauces. Salted french fries. Olives. Angie Fava. Relishes. Sauerkraut. Salsa. Meat and Other Protein Products Salted, canned, smoked, spiced, or pickled meats, seafood, or fish. Bacon, ham, sausage, hot dogs, corned beef, chipped beef, and packaged luncheon meats. Salt pork. Jerky. Pickled herring. Anchovies, regular canned tuna, and sardines. Salted nuts. Dairy Processed cheese and cheese spreads. Cheese curds. Blue cheese and cottage cheese. Buttermilk.  Condiments Onion and garlic salt, seasoned salt, table salt, and sea salt. Canned and packaged gravies. Worcestershire sauce. Tartar sauce. Barbecue sauce. Teriyaki sauce. Soy sauce, including reduced sodium. Steak sauce. Fish sauce. Oyster sauce. Cocktail sauce. Horseradish. Regular ketchup and mustard. Meat flavorings and tenderizers. Bouillon cubes. Hot sauce. Tabasco sauce. Marinades. Taco seasonings. Relishes. Fats and Oils Regular salad dressings. Salted butter. Margarine. Ghee. Bacon fat.  Other Potato and tortilla chips. Corn chips and puffs. Salted popcorn and pretzels. Canned or dried soups. Pizza. Frozen entrees and pot pies.  The items listed above may not be a complete list of foods and beverages to avoid. Contact your dietitian for more information. Document Released: 12/12/2001 Document Revised: 06/27/2013 Document Reviewed: 04/26/2013 [facility] Patient Information 2015 [place], [place]. This information is not intended to replace advice given to you by your health care provider. Make sure you discuss any questions you have with your health care provider.

## 2014-05-23 NOTE — Assessment & Plan Note (Signed)
Better with some furosemide Hard for her to take and go out--but needs to be more regular with it Asked her tom onitor her weight also

## 2014-07-17 ENCOUNTER — Other Ambulatory Visit: Payer: Self-pay | Admitting: Internal Medicine

## 2014-07-23 ENCOUNTER — Ambulatory Visit (INDEPENDENT_AMBULATORY_CARE_PROVIDER_SITE_OTHER): Payer: Medicare Other | Admitting: Internal Medicine

## 2014-07-23 ENCOUNTER — Encounter: Payer: Self-pay | Admitting: Internal Medicine

## 2014-07-23 VITALS — BP 144/84 | HR 68 | Temp 98.2°F | Ht 62.0 in | Wt 275.0 lb

## 2014-07-23 DIAGNOSIS — J0100 Acute maxillary sinusitis, unspecified: Secondary | ICD-10-CM

## 2014-07-23 DIAGNOSIS — F321 Major depressive disorder, single episode, moderate: Secondary | ICD-10-CM

## 2014-07-23 DIAGNOSIS — I5032 Chronic diastolic (congestive) heart failure: Secondary | ICD-10-CM | POA: Diagnosis not present

## 2014-07-23 DIAGNOSIS — J301 Allergic rhinitis due to pollen: Secondary | ICD-10-CM

## 2014-07-23 MED ORDER — TEMAZEPAM 15 MG PO CAPS: 15.0000 mg | ORAL_CAPSULE | Freq: Every evening | ORAL | Status: DC | PRN

## 2014-07-23 MED ORDER — AMOXICILLIN 500 MG PO TABS: 1000.0000 mg | ORAL_TABLET | Freq: Two times a day (BID) | ORAL | Status: DC

## 2014-07-23 NOTE — Patient Instructions (Signed)
Please try fexofenadine 180mg  daily or cetirizine 10mg  daily for the allergies.  Make sure you take the furosemide every day!!

## 2014-07-23 NOTE — Assessment & Plan Note (Signed)
Will set her up with counselor here

## 2014-07-23 NOTE — Progress Notes (Signed)
Pre visit review using our clinic review tool, if applicable. No additional management support is needed unless otherwise documented below in the visit note. 

## 2014-07-23 NOTE — Progress Notes (Signed)
Subjective:    Patient ID: Chelsea Keller, female    DOB: 10-Oct-1934, 80 y.o.   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 Hasn't been on nasal steroid due to nosebleeds Only takes occasional benedryl---discussed that this is not a good option  Depression continues Is ready to speak to a counselor Needs referral  Current Outpatient Prescriptions on File Prior to Visit  Medication Sig Dispense Refill  . acetaminophen (TYLENOL) 500 MG tablet Take 500-1,000 mg by mouth daily as needed for mild pain.     . furosemide (LASIX) 40 MG tablet Take 40 mg by mouth.    Marland Kitchen KLOR-CON M20 20 MEQ tablet TAKE 1 TABLET TWICE A DAY 180 tablet 2  . temazepam (RESTORIL) 15 MG capsule Take 1-2 capsules (15-30 mg total) by mouth at bedtime as needed for sleep. 90 capsule 0  . traMADol (ULTRAM) 50 MG tablet Take 50 mg by mouth 3 (three) times daily as needed for moderate pain.    Marland Kitchen triamterene-hydrochlorothiazide (MAXZIDE-25) 37.5-25 MG per tablet TAKE 1 TABLET DAILY 90 tablet 0  . vitamin B-12 (CYANOCOBALAMIN) 1000 MCG tablet Take 1,000 mcg by mouth daily.    Marland Kitchen latanoprost (XALATAN) 0.005 % ophthalmic solution Place 1 drop into the right eye at bedtime.      No current facility-administered medications on file prior to visit.    Allergies  Allergen Reactions  . Codeine Sulfate Shortness Of Breath  . Celecoxib Other (See Comments)    Pt bled out  . Erythromycin Base Nausea And Vomiting    Past Medical History  Diagnosis Date    . Left ventricular hypertrophy   . Chronic venous insufficiency   . Hypertriglyceridemia   . Hypertension   . Arthritis   . Allergy   . Impaired fasting glucose   . Hx of colonic polyps   . Gastric ulcer   . Vitamin B12 deficiency   . Sleep disorder   . PONV (postoperative nausea and vomiting)   . Shortness of breath     due to weigh  . Blood dyscrasia     bleed freely, hard to stop  . Fibromyalgia     constant pain    Past Surgical History  Procedure Laterality Date  . Cholecystectomy    . Tonsillectomy    . External fixation wrist fracture  1985    left with pins  . External fixation ankle fracture      Fx.  left ankle-fixation with pins later removed sec to infection 1985  . Cataract extraction  10/2003    OD  . Abdominal hysterectomy    . Hemiarthroplasty shoulder fracture  06/2009  . Femur fracture surgery  06/2009  . Total knee arthroplasty  03/09    left  . Tonsillectomy    . Fracture surgery    . Eye surgery    . Joint replacement    . Colonoscopy w/ polypectomy    . Hardware removal Left 10/05/2013    Procedure: HARDWARE REMOVAL LEFT DISTAL FEMUR;  Surgeon: Rozanna Box, MD;  Location: [place];  Service: Orthopedics;  Laterality: Left;  . Steriod injection Right 10/05/2013    Procedure: STEROID INJECTION;  Surgeon: Rozanna Box, MD;  Location: [place];  Service: Orthopedics;  Laterality: Right;    Family History  Problem Relation Age of Onset  . Depression Mother   . Cancer Mother     uterine cancer  . Cancer Brother     prostate cancer  . Diabetes Maternal Aunt   . Arthritis Brother   . Asthma Brother     History   Social History  . Marital Status: Widowed    Spouse Name: N/A    Number of Children: 4  . 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 Smoking status: Former Smoker -- 1.00 packs/day for 40 years    Types: Cigarettes    Quit  date: 07/06/1993  . Smokeless tobacco: Never Used  . Alcohol Use: 0.0 oz/week    0 Not specified per week     Comment: rare  . Drug Use: No  . Sexual Activity: Not on file   Other Topics Concern  . Not on file   Social History Narrative   No living will   Plans to do health care POA forms---wants daughter [name]   Would accept resuscitation attempts---no prolonged ventilation   Absolutely no feeding tube   Review of Systems Weight seems stable Does have edema--- hasn't been taking the furosemide regularly (because it keeps her going to bathroom even at night after AM dose)    Objective:   Physical Exam  Constitutional: She appears well-developed. No distress.  HENT:  Mouth/Throat: Oropharynx is clear and moist. No oropharyngeal exudate.  Mild maxillary tenderness Moderate nasal inflammation TMs normal  Neck: Normal range of motion. Neck supple.  Cardiovascular: Normal rate, regular rhythm and normal heart sounds.  Exam reveals no gallop.   No murmur heard. Pulmonary/Chest: Effort normal and breath sounds normal. No respiratory distress. She has no wheezes. She has no rales.  Musculoskeletal:  1-2+ tense edema in calves  Lymphadenopathy:    She has no cervical adenopathy.          Assessment & Plan:

## 2014-07-23 NOTE — Assessment & Plan Note (Signed)
Infection is not clear cut but ongoing cough and drainage Will try amoxil

## 2014-07-23 NOTE — Assessment & Plan Note (Signed)
Really needs to take the furosemide Discussed taking it early and dealing with the polyuria

## 2014-07-23 NOTE — Assessment & Plan Note (Signed)
Discussed better choices in antihistamines Should consider montelukast

## 2014-08-03 ENCOUNTER — Observation Stay (HOSPITAL_COMMUNITY)
Admission: AD | Admit: 2014-08-03 | Discharge: 2014-08-07 | Disposition: A | Discharge status: Home / Self Care | Payer: Medicare Other | Source: Ambulatory Visit | Attending: Cardiovascular Disease | Admitting: Cardiovascular Disease

## 2014-08-03 ENCOUNTER — Encounter: Payer: Self-pay | Admitting: Cardiovascular Disease

## 2014-08-03 ENCOUNTER — Encounter (HOSPITAL_COMMUNITY): Payer: Self-pay | Admitting: *Deleted

## 2014-08-03 ENCOUNTER — Observation Stay (HOSPITAL_COMMUNITY): Payer: Medicare Other

## 2014-08-03 ENCOUNTER — Ambulatory Visit (INDEPENDENT_AMBULATORY_CARE_PROVIDER_SITE_OTHER): Payer: Medicare Other | Admitting: Cardiovascular Disease

## 2014-08-03 VITALS — BP 118/56 | HR 99 | Ht 62.0 in | Wt 264.0 lb

## 2014-08-03 DIAGNOSIS — F329 Major depressive disorder, single episode, unspecified: Secondary | ICD-10-CM | POA: Diagnosis not present

## 2014-08-03 DIAGNOSIS — I517 Cardiomegaly: Secondary | ICD-10-CM | POA: Diagnosis not present

## 2014-08-03 DIAGNOSIS — M797 Fibromyalgia: Secondary | ICD-10-CM | POA: Insufficient documentation

## 2014-08-03 DIAGNOSIS — I509 Heart failure, unspecified: Secondary | ICD-10-CM | POA: Diagnosis not present

## 2014-08-03 DIAGNOSIS — I872 Venous insufficiency (chronic) (peripheral): Secondary | ICD-10-CM | POA: Diagnosis present

## 2014-08-03 DIAGNOSIS — Z6841 Body Mass Index (BMI) 40.0 and over, adult: Secondary | ICD-10-CM | POA: Diagnosis not present

## 2014-08-03 DIAGNOSIS — E876 Hypokalemia: Secondary | ICD-10-CM | POA: Diagnosis not present

## 2014-08-03 DIAGNOSIS — M1990 Unspecified osteoarthritis, unspecified site: Secondary | ICD-10-CM | POA: Diagnosis not present

## 2014-08-03 DIAGNOSIS — I5033 Acute on chronic diastolic (congestive) heart failure: Secondary | ICD-10-CM

## 2014-08-03 DIAGNOSIS — I10 Essential (primary) hypertension: Secondary | ICD-10-CM

## 2014-08-03 DIAGNOSIS — D509 Iron deficiency anemia, unspecified: Secondary | ICD-10-CM

## 2014-08-03 DIAGNOSIS — R42 Dizziness and giddiness: Secondary | ICD-10-CM | POA: Diagnosis not present

## 2014-08-03 DIAGNOSIS — R0600 Dyspnea, unspecified: Secondary | ICD-10-CM

## 2014-08-03 DIAGNOSIS — F32A Depression, unspecified: Secondary | ICD-10-CM

## 2014-08-03 DIAGNOSIS — M159 Polyosteoarthritis, unspecified: Secondary | ICD-10-CM | POA: Diagnosis present

## 2014-08-03 HISTORY — DX: Anxiety disorder, unspecified: F41.9

## 2014-08-03 HISTORY — DX: Depression, unspecified: F32.A

## 2014-08-03 HISTORY — DX: Chronic diastolic (congestive) heart failure: I50.32

## 2014-08-03 HISTORY — DX: Major depressive disorder, single episode, unspecified: F32.9

## 2014-08-03 LAB — BASIC METABOLIC PANEL
Anion gap: 11 (ref 5–15)
BUN: 30 mg/dL — ABNORMAL HIGH (ref 6–23)
CO2: 31 mmol/L (ref 19–32)
CREATININE: 1.31 mg/dL — AB (ref 0.50–1.10)
Calcium: 8.8 mg/dL (ref 8.4–10.5)
Chloride: 92 mmol/L — ABNORMAL LOW (ref 96–112)
GFR calc Af Amer: 44 mL/min — ABNORMAL LOW (ref >90)
GFR, EST NON AFRICAN AMERICAN: 38 mL/min — AB (ref >90)
Glucose, Bld: 121 mg/dL — ABNORMAL HIGH (ref 70–99)
POTASSIUM: 2.8 mmol/L — AB (ref 3.5–5.1)
Sodium: 134 mmol/L — ABNORMAL LOW (ref 135–145)

## 2014-08-03 LAB — CBC WITH DIFFERENTIAL/PLATELET
Basophils Absolute: 0.1 10*3/uL (ref 0.0–0.1)
Basophils Relative: 1 % (ref 0–1)
Eosinophils Absolute: 0.2 10*3/uL (ref 0.0–0.7)
Eosinophils Relative: 2 % (ref 0–5)
HEMATOCRIT: 30.8 % — AB (ref 36.0–46.0)
HEMOGLOBIN: 8.8 g/dL — AB (ref 12.0–15.0)
LYMPHS PCT: 21 % (ref 12–46)
Lymphs Abs: 2.5 10*3/uL (ref 0.7–4.0)
MCH: 19.6 pg — AB (ref 26.0–34.0)
MCHC: 28.6 g/dL — ABNORMAL LOW (ref 30.0–36.0)
MCV: 68.8 fL — ABNORMAL LOW (ref 78.0–100.0)
Monocytes Absolute: 1.1 10*3/uL — ABNORMAL HIGH (ref 0.1–1.0)
Monocytes Relative: 9 % (ref 3–12)
NEUTROS ABS: 7.9 10*3/uL — AB (ref 1.7–7.7)
Neutrophils Relative %: 67 % (ref 43–77)
PLATELETS: 373 10*3/uL (ref 150–400)
RBC: 4.48 MIL/uL (ref 3.87–5.11)
RDW: 19 % — AB (ref 11.5–15.5)
WBC: 11.8 10*3/uL — AB (ref 4.0–10.5)

## 2014-08-03 LAB — COMPREHENSIVE METABOLIC PANEL
ALT: 14 U/L (ref 0–35)
AST: 30 U/L (ref 0–37)
Albumin: 3.5 g/dL (ref 3.5–5.2)
Alkaline Phosphatase: 89 U/L (ref 39–117)
Anion gap: 11 (ref 5–15)
BUN: 26 mg/dL — ABNORMAL HIGH (ref 6–23)
CO2: 32 mmol/L (ref 19–32)
Calcium: 9.5 mg/dL (ref 8.4–10.5)
Chloride: 93 mmol/L — ABNORMAL LOW (ref 96–112)
Creatinine, Ser: 1.32 mg/dL — ABNORMAL HIGH (ref 0.50–1.10)
GFR calc Af Amer: 44 mL/min — ABNORMAL LOW (ref >90)
GFR, EST NON AFRICAN AMERICAN: 38 mL/min — AB (ref >90)
GLUCOSE: 114 mg/dL — AB (ref 70–99)
Potassium: 2.8 mmol/L — ABNORMAL LOW (ref 3.5–5.1)
Sodium: 136 mmol/L (ref 135–145)
TOTAL PROTEIN: 6.9 g/dL (ref 6.0–8.3)
Total Bilirubin: 1 mg/dL (ref 0.3–1.2)

## 2014-08-03 LAB — TROPONIN I
Troponin I: <0.03 ng/mL (ref <0.031)
Troponin I: <0.03 ng/mL (ref <0.031)

## 2014-08-03 LAB — BRAIN NATRIURETIC PEPTIDE: B NATRIURETIC PEPTIDE 5: 42.5 pg/mL (ref 0.0–100.0)

## 2014-08-03 LAB — D-DIMER, QUANTITATIVE: D-Dimer, Quant: 2.19 ug/mL-FEU — ABNORMAL HIGH (ref 0.00–0.48)

## 2014-08-03 LAB — TSH: TSH: 1.544 u[IU]/mL (ref 0.350–4.500)

## 2014-08-03 MED ORDER — POTASSIUM CHLORIDE CRYS ER 20 MEQ PO TBCR
20.0000 meq | EXTENDED_RELEASE_TABLET | Freq: Two times a day (BID) | ORAL | Status: DC
  Administered 2014-08-03: 20 meq via ORAL
  Filled 2014-08-03 (×3): qty 1

## 2014-08-03 MED ORDER — POTASSIUM CHLORIDE CRYS ER 20 MEQ PO TBCR
40.0000 meq | EXTENDED_RELEASE_TABLET | Freq: Once | ORAL | Status: AC
  Administered 2014-08-04: 40 meq via ORAL
  Filled 2014-08-03: qty 2

## 2014-08-03 MED ORDER — LATANOPROST 0.005 % OP SOLN
1.0000 [drp] | Freq: Every day | OPHTHALMIC | Status: DC
  Filled 2014-08-03: qty 2.5

## 2014-08-03 MED ORDER — TEMAZEPAM 15 MG PO CAPS: 15.0000 mg | ORAL_CAPSULE | Freq: Every evening | ORAL | Status: DC | PRN

## 2014-08-03 MED ORDER — ENOXAPARIN SODIUM 40 MG/0.4ML ~~LOC~~ SOLN
40.0000 mg | SUBCUTANEOUS | Status: DC
  Administered 2014-08-03 – 2014-08-06 (×4): 40 mg via SUBCUTANEOUS
  Filled 2014-08-03 (×5): qty 0.4

## 2014-08-03 MED ORDER — TRIAMTERENE-HCTZ 37.5-25 MG PO TABS
1.0000 | ORAL_TABLET | Freq: Every day | ORAL | Status: DC
  Filled 2014-08-03: qty 1

## 2014-08-03 MED ORDER — ONDANSETRON HCL 4 MG/2ML IJ SOLN: 4.0000 mg | Freq: Four times a day (QID) | INTRAMUSCULAR | Status: DC | PRN

## 2014-08-03 MED ORDER — ACETAMINOPHEN 325 MG PO TABS
650.0000 mg | ORAL_TABLET | ORAL | Status: DC | PRN
  Administered 2014-08-03 – 2014-08-06 (×4): 650 mg via ORAL
  Filled 2014-08-03 (×4): qty 2

## 2014-08-03 MED ORDER — FUROSEMIDE 40 MG PO TABS
40.0000 mg | ORAL_TABLET | Freq: Two times a day (BID) | ORAL | Status: DC
  Administered 2014-08-03 – 2014-08-05 (×4): 40 mg via ORAL
  Filled 2014-08-03 (×11): qty 1

## 2014-08-03 MED ORDER — LIVING BETTER WITH HEART FAILURE BOOK
Freq: Once | Status: AC
  Administered 2014-08-03: 15:00:00

## 2014-08-03 NOTE — Progress Notes (Signed)
History of Present Illness: 80 yo WF with history of obesity, HTN and chronic lower extremity edema here today for a routine visit. She has a remote history of tobacco abuse. She has had a normal myoview stress test in April of 2008. Her echo in May 2010 showed normal LV function and no significant valvular issues. She was last seen here in 2013.   She is here today for follow up. She tells me that she has had progressive failure to thrive with weakness, dizziness, dyspnea for the last 3 months. She was seen in primary care but chest x-ray was ok. She has chronic lower extremity edema and had been off of her Lasix. She restarted one week ago and has been losing weight but is feeling worse. She notes occasional chest pains but this is not daily. She endorses a cough productive of clear sputum. She becomes dyspneic with minimal exertion. No energy. She feels dizzy and has been near syncopal. No episodes of tachycardia or racing heart.   Primary Care Physician: Silvio Pate  Last Lipid Profile:Lipid Panel     Component Value Date/Time   CHOL 166 09/04/2013 1134   TRIG 145.0 09/04/2013 1134   HDL 46.10 09/04/2013 1134   CHOLHDL 4 09/04/2013 1134   VLDL 29.0 09/04/2013 1134   [place] 91 09/04/2013 1134    Past Medical History  Diagnosis Date  . Left ventricular hypertrophy   . Chronic venous insufficiency   . Hypertriglyceridemia   . Hypertension   . Arthritis   . Allergy   . Impaired fasting glucose   . Hx of colonic polyps   . Gastric ulcer   . Vitamin B12 deficiency   . Sleep disorder   . PONV (postoperative nausea and vomiting)   . Shortness of breath     due to weigh  . Blood dyscrasia     bleed freely, hard to stop  . Fibromyalgia     constant pain    Past Surgical History  Procedure Laterality Date  . Cholecystectomy    . Tonsillectomy    . External fixation wrist fracture  1985    left with pins  . External fixation ankle fracture      Fx.  left ankle-fixation with  pins later removed sec to infection 1985  . Cataract extraction  10/2003    OD  . Abdominal hysterectomy    . Hemiarthroplasty shoulder fracture  06/2009  . Femur fracture surgery  06/2009  . Total knee arthroplasty  03/09    left  . Tonsillectomy    . Fracture surgery    . Eye surgery    . Joint replacement    . Colonoscopy w/ polypectomy    . Hardware removal Left 10/05/2013    Procedure: HARDWARE REMOVAL LEFT DISTAL FEMUR;  Surgeon: Rozanna Box, MD;  Location: [place];  Service: Orthopedics;  Laterality: Left;  . Steriod injection Right 10/05/2013    Procedure: STEROID INJECTION;  Surgeon: Rozanna Box, MD;  Location: [place];  Service: Orthopedics;  Laterality: Right;    Current Outpatient Prescriptions  Medication Sig Dispense Refill  . acetaminophen (TYLENOL) 500 MG tablet Take 500-1,000 mg by mouth daily as needed for mild pain.     Marland Kitchen amoxicillin (AMOXIL) 500 MG tablet Take 2 tablets (1,000 mg total) by mouth 2 (two) times daily. 40 tablet 0  . furosemide (LASIX) 40 MG tablet Take 40 mg by mouth.    Marland Kitchen KLOR-CON M20 20 MEQ tablet TAKE  1 TABLET TWICE A DAY 180 tablet 2  . latanoprost (XALATAN) 0.005 % ophthalmic solution Place 1 drop into the right eye at bedtime.     . temazepam (RESTORIL) 15 MG capsule Take 1-2 capsules (15-30 mg total) by mouth at bedtime as needed for sleep. 90 capsule 0  . traMADol (ULTRAM) 50 MG tablet Take 50 mg by mouth 3 (three) times daily as needed for moderate pain.    Marland Kitchen triamterene-hydrochlorothiazide (MAXZIDE-25) 37.5-25 MG per tablet TAKE 1 TABLET DAILY 90 tablet 0  . vitamin B-12 (CYANOCOBALAMIN) 1000 MCG tablet Take 1,000 mcg by mouth daily.     No current facility-administered medications for this visit.    Allergies  Allergen Reactions  . Codeine Sulfate Shortness Of Breath  . Celecoxib Other (See Comments)    Pt bled out  . Erythromycin Base Nausea And Vomiting    History   Social History  . Marital Status: Widowed    Spouse Name:  N/A    Number of Children: 4  . 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 Smoking status: Former Smoker -- 1.00 packs/day for 40 years    Types: Cigarettes    Quit date: 07/06/1993  . Smokeless tobacco: Never Used  . Alcohol Use: 0.0 oz/week    0 Not specified per week     Comment: rare  . Drug Use: No  . Sexual Activity: Not on file   Other Topics Concern  . Not on file   Social History Narrative   No living will   Plans to do health care POA forms---wants daughter [name]   Would accept resuscitation attempts---no prolonged ventilation   Absolutely no feeding tube    Family History  Problem Relation Age of Onset  . Depression Mother   . Cancer Mother     uterine cancer  . Cancer Brother     prostate cancer  . Diabetes Maternal Aunt   . Arthritis Brother   . Asthma Brother     Review of Systems:  As stated in the HPI and otherwise negative.   BP 118/56 mmHg  Pulse 99  Ht 5\' 2"  (1.575 m)  Wt 264 lb (119.75 kg)  BMI 48.27 kg/m2  SpO2 91%  Physical Examination: General: Well developed, well nourished, NAD HEENT: OP clear, mucus membranes moist SKIN: warm, dry. No rashes. Neuro: No focal deficits Musculoskeletal: Muscle strength 5/5 all ext Psychiatric: Mood and affect normal Neck: No JVD, no carotid bruits, no thyromegaly, no lymphadenopathy. Lungs:Clear bilaterally, no wheezes, rhonci, crackles Cardiovascular: Regular rate and rhythm. No murmurs, gallops or rubs. Abdomen:Soft. Bowel sounds present. Non-tender.  Extremities: 1+ bilateral lower extremity edema. Pulses are hard to palpate bilaterally secondary to edema.    Assessment and Plan:   1. Dyspnea/fatigue/chest pain/Dizziness: She tells me today that she feels "terrible like I am going to die". She is adamant that she cannot go home today. Her BP is in a normal range. She is actually down 11 lbs in  last 10 days since resuming Lasix. Her lungs are clear. She is having some chest pain but this does not seem to be her main complaint. She is unable to ambulate without severe dyspnea and dizziness. Will admit to telemetry unit and follow on the monitor. Will check CMET, CBC, TSH, BNP, d-dimer. If her d-dimer is elevated, will need CTA to exclude  PE. Will check chest x-ray. Repeat echocardiogram. I do not think this is ACS. Will cycle cardiac markers. I do not think a cath is indicated.   2. Acute on chronic diastolic CHF: Will continue Lasix for now. I think she is volume overloaded but this does not explain her symptoms. Repeat echo as above.   1. Lower extremity edema: Likely dependent edema. Continue Lasix 40 mg po Qdaily.   2. HTN: BP elevated today. She does not wish to change her meds despite my attempts to do this. She will check at home and let us know if it remains elevated.   3. HTN: BP controlled.   4. Fibromyalgia: She has been depressed for several years following her husbands death. Some of this may be related to her depression and fibromyalgia.

## 2014-08-03 NOTE — H&P (Signed)
Expand All Collapse All      History of Present Illness: 80 yo WF with history of obesity, HTN and chronic lower extremity edema here today for a routine visit. She has a remote history of tobacco abuse. She has had a normal myoview stress test in April of 2008. Her echo in May 2010 showed normal LV function and no significant valvular issues. She was last seen here in 2013.   She is here today for follow up. She tells me that she has had progressive failure to thrive with weakness, dizziness, dyspnea for the last 3 months. She was seen in primary care but chest x-ray was ok. She has chronic lower extremity edema and had been off of her Lasix. She restarted one week ago and has been losing weight but is feeling worse. She notes occasional chest pains but this is not daily. She endorses a cough productive of clear sputum. She becomes dyspneic with minimal exertion. No energy. She feels dizzy and has been near syncopal. No episodes of tachycardia or racing heart.   Primary Care Physician: Silvio Pate  Last Lipid Profile:Lipid Panel   Labs (Brief)       Component Value Date/Time   CHOL 166 09/04/2013 1134   TRIG 145.0 09/04/2013 1134   HDL 46.10 09/04/2013 1134   CHOLHDL 4 09/04/2013 1134   VLDL 29.0 09/04/2013 1134   [place] 91 09/04/2013 1134      Past Medical History  Diagnosis Date  . Left ventricular hypertrophy   . Chronic venous insufficiency   . Hypertriglyceridemia   . Hypertension   . Arthritis   . Allergy   . Impaired fasting glucose   . Hx of colonic polyps   . Gastric ulcer   . Vitamin B12 deficiency   . Sleep disorder   . PONV (postoperative nausea and vomiting)   . Shortness of breath     due to weigh  . Blood dyscrasia     bleed freely, hard to stop  . Fibromyalgia     constant pain    Past Surgical History  Procedure Laterality Date  . Cholecystectomy      . Tonsillectomy    . External fixation wrist fracture  1985    left with pins  . External fixation ankle fracture      Fx. left ankle-fixation with pins later removed sec to infection 1985  . Cataract extraction  10/2003    OD  . Abdominal hysterectomy    . Hemiarthroplasty shoulder fracture  06/2009  . Femur fracture surgery  06/2009  . Total knee arthroplasty  03/09    left  . Tonsillectomy    . Fracture surgery    . Eye surgery    . Joint replacement    . Colonoscopy w/ polypectomy    . Hardware removal Left 10/05/2013    Procedure: HARDWARE REMOVAL LEFT DISTAL FEMUR; Surgeon: Rozanna Box, MD; Location: [place]; Service: Orthopedics; Laterality: Left;  . Steriod injection Right 10/05/2013    Procedure: STEROID INJECTION; Surgeon: Rozanna Box, MD; Location: [place]; Service: Orthopedics; Laterality: Right;    Current Outpatient Prescriptions  Medication Sig Dispense Refill  . acetaminophen (TYLENOL) 500 MG tablet Take 500-1,000 mg by mouth daily as needed for mild pain.     Marland Kitchen amoxicillin (AMOXIL) 500 MG tablet Take 2 tablets (1,000 mg total) by mouth 2 (two) times daily. 40 tablet 0  . furosemide (LASIX) 40 MG tablet Take 40 mg by mouth.    Marland Kitchen  KLOR-CON M20 20 MEQ tablet TAKE 1 TABLET TWICE A DAY 180 tablet 2  . latanoprost (XALATAN) 0.005 % ophthalmic solution Place 1 drop into the right eye at bedtime.     . temazepam (RESTORIL) 15 MG capsule Take 1-2 capsules (15-30 mg total) by mouth at bedtime as needed for sleep. 90 capsule 0  . traMADol (ULTRAM) 50 MG tablet Take 50 mg by mouth 3 (three) times daily as needed for moderate pain.    Marland Kitchen triamterene-hydrochlorothiazide (MAXZIDE-25) 37.5-25 MG per tablet TAKE 1 TABLET DAILY 90 tablet 0  . vitamin B-12 (CYANOCOBALAMIN) 1000 MCG tablet Take 1,000 mcg by mouth daily.     No current  facility-administered medications for this visit.    Allergies  Allergen Reactions  . Codeine Sulfate Shortness Of Breath  . Celecoxib Other (See Comments)    Pt bled out  . Erythromycin Base Nausea And Vomiting    History   Social History  . Marital Status: Widowed    Spouse Name: N/A    Number of Children: 4  . 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 Smoking status: Former Smoker -- 1.00 packs/day for 40 years    Types: Cigarettes    Quit date: 07/06/1993  . Smokeless tobacco: Never Used  . Alcohol Use: 0.0 oz/week    0 Not specified per week     Comment: rare  . Drug Use: No  . Sexual Activity: Not on file   Other Topics Concern  . Not on file   Social History Narrative   No living will   Plans to do health care POA forms---wants daughter [name]   Would accept resuscitation attempts---no prolonged ventilation   Absolutely no feeding tube    Family History  Problem Relation Age of Onset  . Depression Mother   . Cancer Mother     uterine cancer  . Cancer Brother     prostate cancer  . Diabetes Maternal Aunt   . Arthritis Brother   . Asthma Brother     Review of Systems: As stated in the HPI and otherwise negative.   BP 118/56 mmHg  Pulse 99  Ht 5\' 2"  (1.575 m)  Wt 264 lb (119.75 kg)  BMI 48.27 kg/m2  SpO2 91%  Physical Examination: General: Well developed, well nourished, NAD  HEENT: OP clear, mucus membranes moist  SKIN: warm, dry. No rashes. Neuro: No focal deficits  Musculoskeletal: Muscle strength 5/5 all ext  Psychiatric: Mood and affect normal  Neck: No JVD, no carotid bruits, no thyromegaly, no lymphadenopathy.  Lungs:Clear bilaterally, no wheezes, rhonci, crackles Cardiovascular: Regular rate and  rhythm. No murmurs, gallops or rubs. Abdomen:Soft. Bowel sounds present. Non-tender.  Extremities: 1+ bilateral lower extremity edema. Pulses are hard to palpate bilaterally secondary to edema.    Assessment and Plan:   1. Dyspnea/fatigue/chest pain/Dizziness: She tells me today that she feels "terrible like I am going to die". She is adamant that she cannot go home today. Her BP is in a normal range. She is actually down 11 lbs in last 10 days since resuming Lasix. Her lungs are clear. She is having some chest pain but this does not seem to be her main complaint. She is unable to ambulate without severe dyspnea and dizziness. Will admit to telemetry unit and follow on the monitor. Will check CMET, CBC, TSH, BNP,  d-dimer. If her d-dimer is elevated, will need CTA to exclude PE. Will check chest x-ray. Repeat echocardiogram. I do not think this is ACS. Will cycle cardiac markers. I do not think a cath is indicated.   2. Acute on chronic diastolic CHF: Will continue Lasix for now. I think she is volume overloaded but this does not explain her symptoms. Repeat echo as above.   1. Lower extremity edema: Likely dependent edema. Continue Lasix 40 mg po Qdaily.   2. HTN: BP elevated today. She does not wish to change her meds despite my attempts to do this. She will check at home and let us know if it remains elevated.   3. HTN: BP controlled.   4. Fibromyalgia: She has been depressed for several years following her husbands death. Some of this may be related to her depression and fibromyalgia.

## 2014-08-03 NOTE — Progress Notes (Signed)
Chaplain initiated initial visit.  Patient's main concern was finding out cause of current health issues. Lyn Henri Chaplain  08/03/2014 3:42 PM

## 2014-08-03 NOTE — Patient Instructions (Signed)
You are being admitted to [place] through [facility] [place] [facility]) and they will direct you to the admitting office.

## 2014-08-03 NOTE — Progress Notes (Signed)
Pt arrived to unit at 1240 as a direct admit from Dr Camillia Herter office. Pt was greeted by unit NT Cari and other unit personal.

## 2014-08-04 DIAGNOSIS — E876 Hypokalemia: Secondary | ICD-10-CM | POA: Diagnosis not present

## 2014-08-04 DIAGNOSIS — Z6841 Body Mass Index (BMI) 40.0 and over, adult: Secondary | ICD-10-CM | POA: Diagnosis not present

## 2014-08-04 DIAGNOSIS — I5033 Acute on chronic diastolic (congestive) heart failure: Secondary | ICD-10-CM | POA: Diagnosis not present

## 2014-08-04 DIAGNOSIS — I10 Essential (primary) hypertension: Secondary | ICD-10-CM | POA: Diagnosis not present

## 2014-08-04 DIAGNOSIS — F329 Major depressive disorder, single episode, unspecified: Secondary | ICD-10-CM | POA: Diagnosis not present

## 2014-08-04 LAB — BASIC METABOLIC PANEL
ANION GAP: 9 (ref 5–15)
BUN: 29 mg/dL — AB (ref 6–23)
CO2: 33 mmol/L — ABNORMAL HIGH (ref 19–32)
Calcium: 9.3 mg/dL (ref 8.4–10.5)
Chloride: 94 mmol/L — ABNORMAL LOW (ref 96–112)
Creatinine, Ser: 1.29 mg/dL — ABNORMAL HIGH (ref 0.50–1.10)
GFR, EST AFRICAN AMERICAN: 45 mL/min — AB (ref >90)
GFR, EST NON AFRICAN AMERICAN: 39 mL/min — AB (ref >90)
GLUCOSE: 144 mg/dL — AB (ref 70–99)
Potassium: 2.8 mmol/L — ABNORMAL LOW (ref 3.5–5.1)
SODIUM: 136 mmol/L (ref 135–145)

## 2014-08-04 LAB — MAGNESIUM: Magnesium: 1.8 mg/dL (ref 1.5–2.5)

## 2014-08-04 LAB — CBC
HEMATOCRIT: 30.7 % — AB (ref 36.0–46.0)
Hemoglobin: 8.7 g/dL — ABNORMAL LOW (ref 12.0–15.0)
MCH: 19.8 pg — ABNORMAL LOW (ref 26.0–34.0)
MCHC: 28.3 g/dL — AB (ref 30.0–36.0)
MCV: 69.9 fL — ABNORMAL LOW (ref 78.0–100.0)
Platelets: 371 10*3/uL (ref 150–400)
RBC: 4.39 MIL/uL (ref 3.87–5.11)
RDW: 18.4 % — ABNORMAL HIGH (ref 11.5–15.5)
WBC: 12.6 10*3/uL — ABNORMAL HIGH (ref 4.0–10.5)

## 2014-08-04 LAB — TROPONIN I

## 2014-08-04 MED ORDER — POTASSIUM CHLORIDE CRYS ER 20 MEQ PO TBCR
40.0000 meq | EXTENDED_RELEASE_TABLET | Freq: Two times a day (BID) | ORAL | Status: AC
  Administered 2014-08-04 (×2): 40 meq via ORAL
  Filled 2014-08-04: qty 2

## 2014-08-04 MED ORDER — SPIRONOLACTONE 25 MG PO TABS
25.0000 mg | ORAL_TABLET | Freq: Every day | ORAL | Status: DC
  Administered 2014-08-04 – 2014-08-07 (×4): 25 mg via ORAL
  Filled 2014-08-04 (×4): qty 1

## 2014-08-04 NOTE — Progress Notes (Signed)
Patient Name: Chelsea Keller Date of Encounter: 08/04/2014  Active Problems:   Acute on chronic diastolic CHF (congestive heart failure)   Hypokalemia   Length of Stay: 1  SUBJECTIVE  A little better since admission, less dyspneic. Oxygen helped a lot. Edema essentially gone. Weak and dizzy when she gets up to bedside commode. K remains very low.  CURRENT MEDS . enoxaparin (LOVENOX) injection  40 mg Subcutaneous Q24H  . furosemide  40 mg Oral BID  . latanoprost  1 drop Right Eye QHS  . potassium chloride SA  40 mEq Oral BID  . spironolactone  25 mg Oral Daily    OBJECTIVE   Intake/Output Summary (Last 24 hours) at 08/04/14 0915 Last data filed at 08/04/14 0845  Gross per 24 hour  Intake    820 ml  Output   2150 ml  Net  -1330 ml   Filed Weights   08/03/14 1250 08/04/14 0606  Weight: 260 lb 12.9 oz (118.3 kg) 259 lb 14.2 oz (117.883 kg)    PHYSICAL EXAM Filed Vitals:   08/03/14 1250 08/03/14 2151 08/04/14 0203 08/04/14 0606  BP: 138/66 151/95 146/67 144/62  Pulse: 102 102 100 98  Temp: 97.5 F (36.4 C) 97.6 F (36.4 C) 97.9 F (36.6 C) 97.5 F (36.4 C)  TempSrc: Oral Oral Oral Oral  Resp: 22 24 20 20   Height: 5' (1.524 m)     Weight: 260 lb 12.9 oz (118.3 kg)   259 lb 14.2 oz (117.883 kg)  SpO2: 99% 100% 100% 100%   General: Alert, oriented x3, no distress, morbidly obese Head: no evidence of trauma, PERRL, EOMI, no exophtalmos or lid lag, no myxedema, no xanthelasma; normal ears, nose and oropharynx Neck: normal jugular venous pulsations and no hepatojugular reflux; brisk carotid pulses without delay and no carotid bruits Chest: clear to auscultation, no signs of consolidation by percussion or palpation, normal fremitus, symmetrical and full respiratory excursions Cardiovascular: normal position and quality of the apical impulse, regular rhythm, normal first and second heart sounds, no rubs or gallops, no murmur Abdomen: no tenderness or distention, no  masses by palpation, no abnormal pulsatility or arterial bruits, normal bowel sounds, no hepatosplenomegaly Extremities: no clubbing, cyanosis or edema; 2+ radial, ulnar and brachial pulses bilaterally; 2+ right femoral, posterior tibial and dorsalis pedis pulses; 2+ left femoral, posterior tibial and dorsalis pedis pulses; no subclavian or femoral bruits Neurological: grossly nonfocal  LABS  CBC  Recent Labs  08/03/14 1251 08/04/14 0033  WBC 11.8* 12.6*  NEUTROABS 7.9*  --   HGB 8.8* 8.7*  HCT 30.8* 30.7*  MCV 68.8* 69.9*  PLT 373 469   Basic Metabolic Panel  Recent Labs  08/03/14 2218 08/04/14 0033  NA 134* 136  K 2.8* 2.8*  CL 92* 94*  CO2 31 33*  GLUCOSE 121* 144*  BUN 30* 29*  CREATININE 1.31* 1.29*  CALCIUM 8.8 9.3   Liver Function Tests  Recent Labs  08/03/14 1251  AST 30  ALT 14  ALKPHOS 89  BILITOT 1.0  PROT 6.9  ALBUMIN 3.5   No results for input(s): LIPASE, AMYLASE in the last 72 hours. Cardiac Enzymes  Recent Labs  08/03/14 1251 08/03/14 1851 08/04/14 0033  TROPONINI <0.03 <0.03 <0.03   BNP Invalid input(s): POCBNP D-Dimer  Recent Labs  08/03/14 1251  DDIMER 2.19*   Hemoglobin A1C No results for input(s): HGBA1C in the last 72 hours. Fasting Lipid Panel No results for input(s): CHOL, HDL, LDLCALC, TRIG, CHOLHDL, LDLDIRECT  in the last 72 hours. Thyroid Function Tests  Recent Labs  08/03/14 1330  TSH 1.544    Radiology Studies Imaging results have been reviewed and Dg Chest 2 View  08/03/2014   CLINICAL DATA:  Congestive heart failure.  EXAM: CHEST  2 VIEW  COMPARISON:  05/09/2014  FINDINGS: Moderate lateral view degradation secondary to patient size and overlying soft tissues. Marked degenerative changes of the right glenohumeral joint. Numerous leads and wires project over the chest. Midline trachea. Cardiomegaly accentuated by AP portable technique. Atherosclerosis in the transverse aorta. No pleural effusion or pneumothorax.  No congestive failure. Low lung volumes with resultant pulmonary interstitial prominence. No lobar consolidation.  IMPRESSION: Cardiomegaly and low lung volumes, without congestive failure or acute disease.   Electronically Signed   By: Abigail Miyamoto M.D.   On: 08/03/2014 16:06    TELE NSR, PACs  ECG NSR, nonspecific ST changes  ASSESSMENT AND PLAN 1. Acute on chronic diastolic HF - improved, probably at/near euvolemia. Echo today. 2. Severe hypokalemia - probable cause of her weakness and "feeling like I am gonna die". Replace. Stop triamterene HCTZ. Start spironolactone. Borderline abnormal renal function (GFR around 40-50). 3. HTN - BP still rather high. Will likely fluctuate a lot with diuretic changes.   Sanda Klein, MD, [facility] [phone] office [phone] pager 08/04/2014 9:15 AM

## 2014-08-04 NOTE — Progress Notes (Signed)
UR completed 

## 2014-08-04 NOTE — Progress Notes (Signed)
  Echocardiogram 2D Echocardiogram has been performed.  Chelsea Keller 08/04/2014, 3:41 PM

## 2014-08-05 DIAGNOSIS — I10 Essential (primary) hypertension: Secondary | ICD-10-CM | POA: Diagnosis not present

## 2014-08-05 DIAGNOSIS — F329 Major depressive disorder, single episode, unspecified: Secondary | ICD-10-CM | POA: Diagnosis not present

## 2014-08-05 DIAGNOSIS — Z6841 Body Mass Index (BMI) 40.0 and over, adult: Secondary | ICD-10-CM | POA: Diagnosis not present

## 2014-08-05 DIAGNOSIS — I5033 Acute on chronic diastolic (congestive) heart failure: Secondary | ICD-10-CM | POA: Diagnosis not present

## 2014-08-05 DIAGNOSIS — E876 Hypokalemia: Secondary | ICD-10-CM | POA: Diagnosis not present

## 2014-08-05 LAB — BASIC METABOLIC PANEL
ANION GAP: 10 (ref 5–15)
BUN: 36 mg/dL — ABNORMAL HIGH (ref 6–23)
CO2: 31 mmol/L (ref 19–32)
Calcium: 8.9 mg/dL (ref 8.4–10.5)
Chloride: 96 mmol/L (ref 96–112)
Creatinine, Ser: 1.32 mg/dL — ABNORMAL HIGH (ref 0.50–1.10)
GFR calc non Af Amer: 38 mL/min — ABNORMAL LOW (ref >90)
GFR, EST AFRICAN AMERICAN: 44 mL/min — AB (ref >90)
Glucose, Bld: 157 mg/dL — ABNORMAL HIGH (ref 70–99)
POTASSIUM: 2.9 mmol/L — AB (ref 3.5–5.1)
Sodium: 137 mmol/L (ref 135–145)

## 2014-08-05 LAB — POTASSIUM: POTASSIUM: 3.8 mmol/L (ref 3.5–5.1)

## 2014-08-05 MED ORDER — POTASSIUM CHLORIDE CRYS ER 20 MEQ PO TBCR
40.0000 meq | EXTENDED_RELEASE_TABLET | Freq: Three times a day (TID) | ORAL | Status: AC
  Administered 2014-08-05 (×3): 40 meq via ORAL
  Filled 2014-08-05 (×2): qty 2

## 2014-08-05 MED ORDER — POTASSIUM CHLORIDE CRYS ER 20 MEQ PO TBCR
40.0000 meq | EXTENDED_RELEASE_TABLET | Freq: Two times a day (BID) | ORAL | Status: AC
  Administered 2014-08-05 (×2): 40 meq via ORAL
  Filled 2014-08-05: qty 2

## 2014-08-05 NOTE — Progress Notes (Signed)
Patient Name: Chelsea Keller Date of Encounter: 08/05/2014  Active Problems:   Acute on chronic diastolic CHF (congestive heart failure)   Hypokalemia   Length of Stay: 2  SUBJECTIVE Not really feeling any better. No orthopnea. Very weak. Remains severely hypokalemic despite potassium supplementation and spironolactone. Normal magnesium level. Echo shows preserved left ventricular ejection fraction and Doppler analysis does not show evidence of residual fluid overload  CURRENT MEDS . enoxaparin (LOVENOX) injection  40 mg Subcutaneous Q24H  . furosemide  40 mg Oral BID  . latanoprost  1 drop Right Eye QHS  . potassium chloride  40 mEq Oral BID  . spironolactone  25 mg Oral Daily    OBJECTIVE   Intake/Output Summary (Last 24 hours) at 08/05/14 0815 Last data filed at 08/05/14 0645  Gross per 24 hour  Intake    930 ml  Output    950 ml  Net    -20 ml   Filed Weights   08/03/14 1250 08/04/14 0606 08/05/14 0640  Weight: 260 lb 12.9 oz (118.3 kg) 259 lb 14.2 oz (117.883 kg) 258 lb 9.6 oz (117.3 kg)    PHYSICAL EXAM Filed Vitals:   08/04/14 2000 08/04/14 2102 08/05/14 0217 08/05/14 0640  BP:  132/59 121/45 134/46  Pulse:  99 95 95  Temp:  97.9 F (36.6 C) 98.2 F (36.8 C) 97.7 F (36.5 C)  TempSrc:  Oral Oral Oral  Resp: 19 20 20 20   Height:      Weight:    258 lb 9.6 oz (117.3 kg)  SpO2:  98% 100% 93%   General: Alert, oriented x3, no distress, morbidly obese Head: no evidence of trauma, PERRL, EOMI, no exophtalmos or lid lag, no myxedema, no xanthelasma; normal ears, nose and oropharynx Neck: normal jugular venous pulsations and no hepatojugular reflux; brisk carotid pulses without delay and no carotid bruits Chest: clear to auscultation, no signs of consolidation by percussion or palpation, normal fremitus, symmetrical and full respiratory excursions Cardiovascular: normal position and quality of the apical impulse, regular rhythm, normal first and second heart  sounds, no rubs or gallops, no murmur Abdomen: no tenderness or distention, no masses by palpation, no abnormal pulsatility or arterial bruits, normal bowel sounds, no hepatosplenomegaly Extremities: no clubbing, cyanosis or edema; 2+ radial, ulnar and brachial pulses bilaterally; 2+ right femoral, posterior tibial and dorsalis pedis pulses; 2+ left femoral, posterior tibial and dorsalis pedis pulses; no subclavian or femoral bruits Neurological: grossly nonfocal  LABS  CBC  Recent Labs  08/03/14 1251 08/04/14 0033  WBC 11.8* 12.6*  NEUTROABS 7.9*  --   HGB 8.8* 8.7*  HCT 30.8* 30.7*  MCV 68.8* 69.9*  PLT 373 720   Basic Metabolic Panel  Recent Labs  08/04/14 0033 08/05/14 0323  NA 136 137  K 2.8* 2.9*  CL 94* 96  CO2 33* 31  GLUCOSE 144* 157*  BUN 29* 36*  CREATININE 1.29* 1.32*  CALCIUM 9.3 8.9  MG 1.8  --    Liver Function Tests  Recent Labs  08/03/14 1251  AST 30  ALT 14  ALKPHOS 89  BILITOT 1.0  PROT 6.9  ALBUMIN 3.5   No results for input(s): LIPASE, AMYLASE in the last 72 hours. Cardiac Enzymes  Recent Labs  08/03/14 1251 08/03/14 1851 08/04/14 0033  TROPONINI <0.03 <0.03 <0.03   BNP Invalid input(s): POCBNP D-Dimer  Recent Labs  08/03/14 1251  DDIMER 2.19*   Hemoglobin A1C No results for input(s): HGBA1C in the last  72 hours. Fasting Lipid Panel No results for input(s): CHOL, HDL, LDLCALC, TRIG, CHOLHDL, LDLDIRECT in the last 72 hours. Thyroid Function Tests  Recent Labs  08/03/14 1330  TSH 1.544    Radiology Studies Dg Chest 2 View  08/03/2014   CLINICAL DATA:  Congestive heart failure.  EXAM: CHEST  2 VIEW  COMPARISON:  05/09/2014  FINDINGS: Moderate lateral view degradation secondary to patient size and overlying soft tissues. Marked degenerative changes of the right glenohumeral joint. Numerous leads and wires project over the chest. Midline trachea. Cardiomegaly accentuated by AP portable technique. Atherosclerosis in the  transverse aorta. No pleural effusion or pneumothorax. No congestive failure. Low lung volumes with resultant pulmonary interstitial prominence. No lobar consolidation.  IMPRESSION: Cardiomegaly and low lung volumes, without congestive failure or acute disease.   Electronically Signed   By: Abigail Miyamoto M.D.   On: 08/03/2014 16:06    TELE NSR  ASSESSMENT AND PLAN Discontinue furosemide temporarily, continue potassium supplementation and daily checks on potassium. Can probably allow 3-4 pounds of fluid gain, taking into account mild worsening of renal function and the echocardiographic findings.   Sanda Klein, MD, [facility] [phone] office [phone] pager 08/05/2014 8:15 AM

## 2014-08-05 NOTE — Progress Notes (Signed)
Utilization Review Completed.   Corde Antonini, RN, BSN Nurse Case Manager  

## 2014-08-06 ENCOUNTER — Observation Stay (HOSPITAL_COMMUNITY): Payer: Medicare Other

## 2014-08-06 ENCOUNTER — Encounter (HOSPITAL_COMMUNITY): Payer: Self-pay | Admitting: Physician Assistant

## 2014-08-06 DIAGNOSIS — I10 Essential (primary) hypertension: Secondary | ICD-10-CM | POA: Diagnosis not present

## 2014-08-06 DIAGNOSIS — R600 Localized edema: Secondary | ICD-10-CM

## 2014-08-06 DIAGNOSIS — I5033 Acute on chronic diastolic (congestive) heart failure: Secondary | ICD-10-CM | POA: Diagnosis not present

## 2014-08-06 DIAGNOSIS — F329 Major depressive disorder, single episode, unspecified: Secondary | ICD-10-CM | POA: Diagnosis not present

## 2014-08-06 DIAGNOSIS — E876 Hypokalemia: Secondary | ICD-10-CM | POA: Diagnosis not present

## 2014-08-06 DIAGNOSIS — Z6841 Body Mass Index (BMI) 40.0 and over, adult: Secondary | ICD-10-CM | POA: Diagnosis not present

## 2014-08-06 DIAGNOSIS — R0600 Dyspnea, unspecified: Secondary | ICD-10-CM | POA: Diagnosis not present

## 2014-08-06 LAB — BASIC METABOLIC PANEL
Anion gap: 8 (ref 5–15)
BUN: 37 mg/dL — ABNORMAL HIGH (ref 6–23)
CALCIUM: 8.9 mg/dL (ref 8.4–10.5)
CO2: 28 mmol/L (ref 19–32)
Chloride: 101 mmol/L (ref 96–112)
Creatinine, Ser: 1.26 mg/dL — ABNORMAL HIGH (ref 0.50–1.10)
GFR calc Af Amer: 46 mL/min — ABNORMAL LOW (ref >90)
GFR calc non Af Amer: 40 mL/min — ABNORMAL LOW (ref >90)
Glucose, Bld: 128 mg/dL — ABNORMAL HIGH (ref 70–99)
Potassium: 4.1 mmol/L (ref 3.5–5.1)
Sodium: 137 mmol/L (ref 135–145)

## 2014-08-06 MED ORDER — ONDANSETRON HCL 8 MG PO TABS
8.0000 mg | ORAL_TABLET | Freq: Once | ORAL | Status: AC
  Administered 2014-08-06: 8 mg via ORAL
  Filled 2014-08-06: qty 2
  Filled 2014-08-06: qty 1

## 2014-08-06 MED ORDER — IOHEXOL 350 MG/ML SOLN
80.0000 mL | Freq: Once | INTRAVENOUS | Status: AC | PRN
  Administered 2014-08-06: 80 mL via INTRAVENOUS

## 2014-08-06 MED ORDER — PREDNISONE 50 MG PO TABS
60.0000 mg | ORAL_TABLET | Freq: Every day | ORAL | Status: DC
  Filled 2014-08-06: qty 1

## 2014-08-06 MED ORDER — ONDANSETRON HCL 8 MG PO TABS: 16.0000 mg | ORAL_TABLET | Freq: Once | ORAL | Status: DC

## 2014-08-06 NOTE — Progress Notes (Signed)
UR completed 

## 2014-08-06 NOTE — Care Management Note (Unsigned)
    Page 1 of 1   08/06/2014     4:39:59 PM CARE MANAGEMENT NOTE 08/06/2014  Patient:  Chelsea Keller, Chelsea Keller   Account Number:  [id number]  Date Initiated:  08/06/2014  Documentation initiated by:  Skyy Nilan  Subjective/Objective Assessment:   Pt adm on 08/03/14 with CHF.  PTA, pt resides at home with granddaughter and 2 sons.     Action/Plan:   Will follow for dc needs as pt progresses.   Anticipated DC Date:  08/08/2014   Anticipated DC Plan:  [place]  CM consult      Choice offered to / List presented to:             Status of service:  In process, will continue to follow Medicare Important Message given?  YES (If response is "NO", the following Medicare IM given date fields will be blank) Date Medicare IM given:  08/06/2014 Medicare IM given by:  Elka Satterfield Date Additional Medicare IM given:   Additional Medicare IM given by:    Discharge Disposition:    Per UR Regulation:  Reviewed for med. necessity/level of care/duration of stay  If discussed at [place] of Stay Meetings, dates discussed:    Comments:

## 2014-08-06 NOTE — Progress Notes (Signed)
Patient Name: Chelsea Keller Date of Encounter: 08/06/2014     Active Problems:   Acute on chronic diastolic CHF (congestive heart failure)   Hypokalemia    SUBJECTIVE  No further CP or SOB. Some DOE and dizziness with ambulation.   CURRENT MEDS . enoxaparin (LOVENOX) injection  40 mg Subcutaneous Q24H  . furosemide  40 mg Oral BID  . latanoprost  1 drop Right Eye QHS  . spironolactone  25 mg Oral Daily    OBJECTIVE  Filed Vitals:   08/05/14 0640 08/05/14 1408 08/05/14 2019 08/06/14 0615  BP: 134/46 161/44 108/43 148/60  Pulse: 95 96 99 83  Temp: 97.7 F (36.5 C) 97.8 F (36.6 C) 97.9 F (36.6 C) 98 F (36.7 C)  TempSrc: Oral Oral Oral Oral  Resp: 20 18 18 17   Height:      Weight: 258 lb 9.6 oz (117.3 kg)   262 lb 2 oz (118.9 kg)  SpO2: 93% 100% 100% 100%    Intake/Output Summary (Last 24 hours) at 08/06/14 1224 Last data filed at 08/06/14 0817  Gross per 24 hour  Intake   1230 ml  Output    500 ml  Net    730 ml   Filed Weights   08/04/14 0606 08/05/14 0640 08/06/14 0615  Weight: 259 lb 14.2 oz (117.883 kg) 258 lb 9.6 oz (117.3 kg) 262 lb 2 oz (118.9 kg)    PHYSICAL EXAM  General: Pleasant, NAD. Obese  Neuro: Alert and oriented X 3. Moves all extremities spontaneously. Psych: Normal affect. HEENT:  Normal  Neck: Supple without bruits or JVD. Lungs:  Resp regular and unlabored, CTA. Heart: RRR no s3, s4, or murmurs. Abdomen: Soft, non-tender, non-distended, BS + x 4.  Extremities: No clubbing, cyanosis. DP/PT/Radials 2+ and equal bilaterally. 1+ edema. Chronic venous stasis   Accessory Clinical Findings  CBC  Recent Labs  08/03/14 1251 08/04/14 0033  WBC 11.8* 12.6*  NEUTROABS 7.9*  --   HGB 8.8* 8.7*  HCT 30.8* 30.7*  MCV 68.8* 69.9*  PLT 373 244   Basic Metabolic Panel  Recent Labs  08/04/14 0033 08/05/14 0323 08/05/14 1243 08/06/14 0351  NA 136 137  --  137  K 2.8* 2.9* 3.8 4.1  CL 94* 96  --  101  CO2 33* 31  --  28    GLUCOSE 144* 157*  --  128*  BUN 29* 36*  --  37*  CREATININE 1.29* 1.32*  --  1.26*  CALCIUM 9.3 8.9  --  8.9  MG 1.8  --   --   --    Liver Function Tests  Recent Labs  08/03/14 1251  AST 30  ALT 14  ALKPHOS 89  BILITOT 1.0  PROT 6.9  ALBUMIN 3.5   No results for input(s): LIPASE, AMYLASE in the last 72 hours. Cardiac Enzymes  Recent Labs  08/03/14 1251 08/03/14 1851 08/04/14 0033  TROPONINI <0.03 <0.03 <0.03   BNP Invalid input(s): POCBNP D-Dimer  Recent Labs  08/03/14 1251  DDIMER 2.19*    Thyroid Function Tests  Recent Labs  08/03/14 1330  TSH 1.544    TELE  NSR  With freq PVCS  Radiology/Studies  Dg Chest 2 View  08/03/2014   CLINICAL DATA:  Congestive heart failure.  EXAM: CHEST  2 VIEW  COMPARISON:  05/09/2014  FINDINGS: Moderate lateral view degradation secondary to patient size and overlying soft tissues. Marked degenerative changes of the right glenohumeral joint. Numerous leads and  wires project over the chest. Midline trachea. Cardiomegaly accentuated by AP portable technique. Atherosclerosis in the transverse aorta. No pleural effusion or pneumothorax. No congestive failure. Low lung volumes with resultant pulmonary interstitial prominence. No lobar consolidation.  IMPRESSION: Cardiomegaly and low lung volumes, without congestive failure or acute disease.   Electronically Signed   By: Abigail Miyamoto M.D.   On: 08/03/2014 16:06    ASSESSMENT AND PLAN  Chelsea Keller is a 80 y.o. female with a history of HTN, chronic diastolic CHF, LE edema, fibromyalgia and depression who was admitted from the office on 08/03/14 for further evaluation of dyspnea/fatigue/chest pain/dizziness   Dyspnea/fatigue/chest pain/dizziness:  she felt "terrible like I am going to die" and was adamant that she needed to be admitted. Still with dyspnea on exertion and dizziness.   Elevated DDimer- 2.19. Will need a CTA to r/o PE in the setting of continue SOB. Creat 1.28.  She states that she has a contrast allergy as it causes her to throw up. Will give her zofran prior to contrast material.  Acute on chronic diastolic CHF: However, BNP normal and no evidence of CHF on CXR -- Echo shows preserved left ventricular ejection fraction and Doppler analysis does not show evidence of residual fluid overload -- Lasix was held due to rising creat and ECHO with no evidence of residual fluid overload.  Lower extremity edema: Chronic and likely dependent edema.   HTN: better controlled today.   Severe hypokalemia - probable cause of her weakness and "feeling like I am gonna die". Replaced.  -- Triamterene HCTZ discontinued and started on spironolactone. Now WNL and she is feeling better.   Fibromyalgia: She has been depressed for several years following her husbands death. Some of this may be related to her depression and fibromyalgia.     Chelsea Pimple PA-C  Pager [phone]    Patient seen and examined. Agree with assessment and plan. Echo with normal LV systolic function with grade 1 diastolic dysfunction.  D dimer increased.  No evidence for S1Q3 on ECG.  With significant dyspnea will plan for CT chest. Doubt contrast allergy ? If prior nausea related to a nuclear myoview study, not contrast.  Also c/o leg cramping; tense edema LLE in calf and less on R; will order LE venous doppler. Pt has significant microcytic anemia with MCV 68; check iron studies ? hemoglobinaphy ? thallasemia trait    Chelsea Sine, MD, [facility] 08/06/2014 1:15 PM

## 2014-08-07 ENCOUNTER — Telehealth: Payer: Self-pay | Admitting: Cardiovascular Disease

## 2014-08-07 DIAGNOSIS — E876 Hypokalemia: Secondary | ICD-10-CM | POA: Diagnosis not present

## 2014-08-07 DIAGNOSIS — I872 Venous insufficiency (chronic) (peripheral): Secondary | ICD-10-CM

## 2014-08-07 DIAGNOSIS — I5033 Acute on chronic diastolic (congestive) heart failure: Secondary | ICD-10-CM | POA: Diagnosis not present

## 2014-08-07 DIAGNOSIS — Z6841 Body Mass Index (BMI) 40.0 and over, adult: Secondary | ICD-10-CM | POA: Diagnosis not present

## 2014-08-07 DIAGNOSIS — I10 Essential (primary) hypertension: Secondary | ICD-10-CM

## 2014-08-07 DIAGNOSIS — D509 Iron deficiency anemia, unspecified: Secondary | ICD-10-CM

## 2014-08-07 DIAGNOSIS — F329 Major depressive disorder, single episode, unspecified: Secondary | ICD-10-CM

## 2014-08-07 DIAGNOSIS — F32A Depression, unspecified: Secondary | ICD-10-CM

## 2014-08-07 LAB — BASIC METABOLIC PANEL
Anion gap: 9 (ref 5–15)
BUN: 27 mg/dL — AB (ref 6–23)
CALCIUM: 8.7 mg/dL (ref 8.4–10.5)
CHLORIDE: 103 mmol/L (ref 96–112)
CO2: 27 mmol/L (ref 19–32)
Creatinine, Ser: 1.13 mg/dL — ABNORMAL HIGH (ref 0.50–1.10)
GFR calc Af Amer: 53 mL/min — ABNORMAL LOW (ref >90)
GFR, EST NON AFRICAN AMERICAN: 46 mL/min — AB (ref >90)
GLUCOSE: 107 mg/dL — AB (ref 70–99)
Potassium: 4 mmol/L (ref 3.5–5.1)
Sodium: 139 mmol/L (ref 135–145)

## 2014-08-07 LAB — RETICULOCYTES
RBC.: 4.31 MIL/uL (ref 3.87–5.11)
Retic Count, Absolute: 125 10*3/uL (ref 19.0–186.0)
Retic Ct Pct: 2.9 % (ref 0.4–3.1)

## 2014-08-07 LAB — CBC
HCT: 31.3 % — ABNORMAL LOW (ref 36.0–46.0)
Hemoglobin: 8.6 g/dL — ABNORMAL LOW (ref 12.0–15.0)
MCH: 20 pg — ABNORMAL LOW (ref 26.0–34.0)
MCHC: 27.5 g/dL — ABNORMAL LOW (ref 30.0–36.0)
MCV: 72.6 fL — AB (ref 78.0–100.0)
Platelets: 375 10*3/uL (ref 150–400)
RBC: 4.31 MIL/uL (ref 3.87–5.11)
RDW: 19.3 % — ABNORMAL HIGH (ref 11.5–15.5)
WBC: 13 10*3/uL — ABNORMAL HIGH (ref 4.0–10.5)

## 2014-08-07 LAB — IRON AND TIBC
IRON: 23 ug/dL — AB (ref 42–145)
SATURATION RATIOS: 6 % — AB (ref 20–55)
TIBC: 410 ug/dL (ref 250–470)
UIBC: 387 ug/dL (ref 125–400)

## 2014-08-07 LAB — VITAMIN B12: Vitamin B-12: 1902 pg/mL — ABNORMAL HIGH (ref 211–911)

## 2014-08-07 LAB — FOLATE: Folate: 7.8 ng/mL

## 2014-08-07 LAB — FERRITIN: Ferritin: 8 ng/mL — ABNORMAL LOW (ref 10–291)

## 2014-08-07 MED ORDER — FUROSEMIDE 40 MG PO TABS: 40.0000 mg | ORAL_TABLET | Freq: Every day | ORAL | Status: DC

## 2014-08-07 MED ORDER — SPIRONOLACTONE 25 MG PO TABS: 25.0000 mg | ORAL_TABLET | Freq: Every day | ORAL | Status: DC

## 2014-08-07 NOTE — Discharge Summary (Signed)
Discharge Summary   Patient ID: Chelsea Keller,  MRN: [id number], DOB/AGE: [birth year] 80 y.o.  Admit date: 08/03/2014 Discharge date: 08/07/2014  Primary Care Provider: Viviana Keller Primary Cardiologist: C. Angelena Form, MD   Discharge Diagnoses Principal Problem:   Acute on chronic diastolic CHF (congestive heart failure)  **Discharge wt of 263 lbs.  Active Problems:   Hypokalemia  **Spironolactone added this admission with K+ of 4.0 on discharge.   Essential hypertension   Morbid obesity with BMI of 40.0-44.9, adult   Depression   Microcytic hypochromic anemia  **Iron studies pending @ discharge.   Venous (peripheral) insufficiency   Osteoarthritis, multiple sites  Allergies Allergies  Allergen Reactions  . Codeine Sulfate Shortness Of Breath  . Celecoxib Other (See Comments)    Pt bled out  . Contrast Media [Iodinated Diagnostic Agents] Other (See Comments)    "Went numb, nausea and vomiting"  . Erythromycin Base Nausea And Vomiting   Procedures  2D Echocardiogram 1.30.2016  Study Conclusions  - Left ventricle: The cavity size was normal. There was mild   concentric hypertrophy. Systolic function was normal. Wall motion   was normal; there were no regional wall motion abnormalities.   Doppler parameters are consistent with abnormal left ventricular   relaxation (grade 1 diastolic dysfunction). - Mitral valve: Calcified annulus. _____________  CT Angio of the Chest with Contrast  2.1.2016  IMPRESSION: Limited exam due to body habitus and respiratory motion. No large or central pulmonary emboli identified as discussed above. Single minimally enlarged subcarinal lymph node. _____________   History of Present Illness  80 y/o female with a h/o morbid obesity, HTN, and chronic venous insufficiency, who was seen in clinic on 1/29 with complaints of weakness, dyspnea, and failure to thrive. She had been taking lasix at home with weight reduction but this did not  improve her dyspnea any.  She also reported occasional chest pain.  She was admitted for further evaluation.  Hospital Course  Following admission, she was found to have mild renal insufficiency with a creatinine of 1.32 and also hypokalemia with a potassium of 2.8.  Her lasix was initially continued but then held beginning on 1/31.  Spironolactone was added with stabilization of potassium levels.  Echocardiography was performed on 1/30 and showed normal LV function with grade 1 diastolic dysfunction.  Her d dimer was elevated at 2.19 and given dyspnea, CTA of the chest with contrast was undertaken and was negative for PE.  Chelsea Keller has been found to have significant microcytic/hypochromic anemia with a hemoglobin of 8.7, Hct of 30.7, MCV of 69.9, and MCHC of 28.3.  She denies a recent h/o BRBPR or melena.  Her last colonoscopy was > 10 yrs ago.  We have sent off iron studies and these are pending at the time of discharge.    She initially complained of dizziness upon ambulating however this has improved.  Heart rate and blood pressure have been stable.  We will plan to discharge her today in good condition.  We are resuming lasix 40 mg daily at discharge and have arranged for early follow-up next week at which time we'll plan to re-evaluate her blood chemistry and f/u iron studies that were ordered this morning.  Discharge Vitals Blood pressure 121/56, pulse 87, temperature 97.9 F (36.6 C), temperature source Oral, resp. rate 17, height 5' (1.524 m), weight 263 lb 14.3 oz (119.7 kg), SpO2 95 %.  Filed Weights   08/05/14 0640 08/06/14 0615 08/07/14 0601  Weight: 258 lb 9.6  oz (117.3 kg) 262 lb 2 oz (118.9 kg) 263 lb 14.3 oz (119.7 kg)   Labs  CBC Lab Results  Component Value Date   WBC 13.0* 08/07/2014   HGB 8.6* 08/07/2014   HCT 31.3* 08/07/2014   MCV 72.6* 08/07/2014   PLT 375 00/92/3300    Basic Metabolic Panel  Recent Labs  08/06/14 0351 08/07/14 0346  NA 137 139  K 4.1 4.0   CL 101 103  CO2 28 27  GLUCOSE 128* 107*  BUN 37* 27*  CREATININE 1.26* 1.13*  CALCIUM 8.9 8.7   Liver Function Tests Lab Results  Component Value Date   ALT 14 08/03/2014   AST 30 08/03/2014   ALKPHOS 89 08/03/2014   BILITOT 1.0 08/03/2014    Cardiac Enzymes Lab Results  Component Value Date   TROPONINI <0.03 08/04/2014   D-Dimer Lab Results  Component Value Date   DDIMER 2.19* 08/03/2014   Thyroid Function Tests Lab Results  Component Value Date   TSH 1.544 08/03/2014    Disposition  Pt is being discharged home today in good condition.  Follow-up Plans & Appointments      Follow-up Information    Follow up with Chelsea Simpler, MD On 78/22/2016.   Specialties:  Internal Medicine, Pediatrics   Why:  8:30 AM   Contact information:   [place] [address] [phone]       Follow up with Chelsea Dopp, PA-C On 08/14/2014.   Specialty:  Physician Assistant   Why:  8:00 AM - Dr. Camillia Herter PA.   Contact information:   [address] [place] [address] [place] [address] [phone]      Discharge Medications    Medication List    STOP taking these medications        KLOR-CON M20 20 MEQ tablet  Generic drug:  potassium chloride SA     triamterene-hydrochlorothiazide 37.5-25 MG per tablet  Commonly known as:  MAXZIDE-25      TAKE these medications        acetaminophen 500 MG tablet  Commonly known as:  TYLENOL  Take 1,000 mg by mouth at bedtime as needed for mild pain or moderate pain.     furosemide 40 MG tablet  Commonly known as:  LASIX  Take 1 tablet (40 mg total) by mouth daily.     spironolactone 25 MG tablet  Commonly known as:  ALDACTONE  Take 1 tablet (25 mg total) by mouth daily.     temazepam 15 MG capsule  Commonly known as:  RESTORIL  Take 1-2 capsules (15-30 mg total) by mouth at bedtime as needed for sleep.     traMADol 50 MG tablet  Commonly known as:  ULTRAM  Take 50 mg by mouth at bedtime as  needed for moderate pain or severe pain.     vitamin B-12 1000 MCG tablet  Commonly known as:  CYANOCOBALAMIN  Take 1,000 mcg by mouth daily.       Outstanding Labs/Studies  BMET in 1 week.  F/U iron studies ordered this morning.  Duration of Discharge Encounter   Greater than 30 minutes including physician time.  Signed, Murray Hodgkins NP 08/07/2014, 11:53 AM

## 2014-08-07 NOTE — Progress Notes (Signed)
Discharge instructions given and explained to pt.  Pt verbalized understanding of all orders/instructions.  Pt denies any questions at this time.  IV removed. VSS. Pt awaiting ride from family. Graceann Congress

## 2014-08-07 NOTE — Progress Notes (Signed)
Patient Name: Chelsea Keller Date of Encounter: 08/07/2014   Principal Problem:   Acute on chronic diastolic CHF (congestive heart failure) Active Problems:   Hypokalemia   Essential hypertension   Morbid obesity with BMI of 40.0-44.9, adult   Depression   Microcytic hypochromic anemia   Venous (peripheral) insufficiency   Osteoarthritis, multiple sites    SUBJECTIVE  No dyspnea or chest pain.  Hasn't been sleeping well - bed uncomfortable, disruptions, etc.  Eager to go home.  CURRENT MEDS . enoxaparin (LOVENOX) injection  40 mg Subcutaneous Q24H  . furosemide  40 mg Oral BID  . latanoprost  1 drop Right Eye QHS  . spironolactone  25 mg Oral Daily   OBJECTIVE  Filed Vitals:   08/06/14 1518 08/06/14 2135 08/06/14 2150 08/07/14 0601  BP: 155/64 160/55 118/59 121/56  Pulse: 93 95 78 87  Temp: 98.1 F (36.7 C) 97.9 F (36.6 C) 98.1 F (36.7 C) 97.9 F (36.6 C)  TempSrc: Oral Oral Oral Oral  Resp: 18 18 18 17   Height:      Weight:    263 lb 14.3 oz (119.7 kg)  SpO2: 98% 95% 96% 95%    Intake/Output Summary (Last 24 hours) at 08/07/14 0952 Last data filed at 08/07/14 0916  Gross per 24 hour  Intake   1620 ml  Output    600 ml  Net   1020 ml   Filed Weights   08/05/14 0640 08/06/14 0615 08/07/14 0601  Weight: 258 lb 9.6 oz (117.3 kg) 262 lb 2 oz (118.9 kg) 263 lb 14.3 oz (119.7 kg)   PHYSICAL EXAM  General: Pleasant, NAD. Neuro: Alert and oriented X 3. Moves all extremities spontaneously. Psych: Normal affect. HEENT:  Normal  Neck: Supple without bruits.  Difficult to assess jvp 2/2 girth. Lungs:  Resp regular and unlabored, CTA. Heart: RRR no s3, s4, or murmurs. Abdomen: Soft, non-tender, non-distended, BS + x 4.  Extremities: No clubbing, cyanosis.  Trace to 1+ bilat LE edema. DP/PT/Radials 2+ and equal bilaterally.  Accessory Clinical Findings  CBC Lab Results  Component Value Date   WBC 12.6* 08/04/2014   HGB 8.7* 08/04/2014   HCT 30.7*  08/04/2014   MCV 69.9* 08/04/2014   PLT 371 29/52/8413    Basic Metabolic Panel  Recent Labs  08/06/14 0351 08/07/14 0346  NA 137 139  K 4.1 4.0  CL 101 103  CO2 28 27  GLUCOSE 128* 107*  BUN 37* 27*  CREATININE 1.26* 1.13*  CALCIUM 8.9 8.7   TELE  rsr  Radiology/Studies  Dg Chest 2 View  08/03/2014   CLINICAL DATA:  Congestive heart failure.  EXAM: CHEST  2 VIEW  COMPARISON:  05/09/2014  FINDINGS: Moderate lateral view degradation secondary to patient size and overlying soft tissues. Marked degenerative changes of the right glenohumeral joint. Numerous leads and wires project over the chest. Midline trachea. Cardiomegaly accentuated by AP portable technique. Atherosclerosis in the transverse aorta. No pleural effusion or pneumothorax. No congestive failure. Low lung volumes with resultant pulmonary interstitial prominence. No lobar consolidation.  IMPRESSION: Cardiomegaly and low lung volumes, without congestive failure or acute disease.   Electronically Signed   By: Abigail Miyamoto M.D.   On: 08/03/2014 16:06   Ct Angio Chest Pe W/cm &/or Wo Cm  08/06/2014   CLINICAL DATA:  Dyspnea, question pulmonary embolism, history hypertension, chronic diastolic CHF, chronic lower extremity edema, former smoker  EXAM: CT ANGIOGRAPHY CHEST WITH CONTRAST  TECHNIQUE: Multidetector CT  imaging of the chest was performed using the standard protocol during bolus administration of intravenous contrast. Multiplanar CT image reconstructions and MIPs were obtained to evaluate the vascular anatomy.  CONTRAST:  61mL OMNIPAQUE IOHEXOL 350 MG/ML SOLN IV  COMPARISON:  04/13/2013 CT chest  FINDINGS: Image quality degraded secondary to body habitus and respiratory motion.  Aorta normal caliber without gross aneurysm or dissection.  Pulmonary arteries with adequately opacified.  No large or central pulmonary emboli seen.  Due to degraded image quality, unable to exclude small and peripheral emboli by this exam.   Minimally enlarged subcarinal lymph node 11 mm short axis.  No additional thoracic adenopathy.  Post cholecystectomy.  Remaining visualized portion of upper abdomen unremarkable.  Scattered respiratory motion artifacts.  No gross infiltrate, pleural effusion or pneumothorax.  Endplate spur formation thoracic spine.  Review of the MIP images confirms the above findings.  IMPRESSION: Limited exam due to body habitus and respiratory motion.  No large or central pulmonary emboli identified as discussed above.  Single minimally enlarged subcarinal lymph node.   Electronically Signed   By: Lavonia Dana M.D.   On: 08/06/2014 17:00   2D Echocardiogram 1.30.2016  Study Conclusions  - Left ventricle: The cavity size was normal. There was mild   concentric hypertrophy. Systolic function was normal. Wall motion   was normal; there were no regional wall motion abnormalities.   Doppler parameters are consistent with abnormal left ventricular   relaxation (grade 1 diastolic dysfunction). - Mitral valve: Calcified annulus. _____________   ASSESSMENT AND PLAN  1.  Acute on chronic diastolic chf:  Nl LV fxn by echo this admission.  Volume stable, though somewhat difficult to judge in setting of morbid obesity.  She has been ambulating and says that her breathing has been improved.  Denies any further dizziness this AM.  K stable this morning.  Spironolactone added 1/30.  Lasix held since 1/31 PM.  Prev home dose of triamterene hctz d/c'd.  No longer on K supplementation.  HR stable.  BP improved.  Ambulate this AM with plan for d/c and early f/u next week.  Cont spiro.  Resume lasix 40mg  daily (prev home dose).    2.  HTN:  BP better this am. Cont spiro.  Resume lasix.  3.  Hypokalemia:  Stable on spironolactone.  Will need early f/u in setting of resumption of lasix.  4.  Morbid Obesity:  Likely a major factor behind her symptoms of dyspnea and deconditioning.  We discussed the importance of increasing daily  activities and exercise along with calorie restriction.  5.  Microcytic/hypochromic anemia:  Hgb 8.7 on 1/30.  Was in 10 range in 09/2013 and 12-13 in 2012/2013, and 8-9 range in 06/2009 (documented ABL anemia following hip surgery).  She denies melena or brbpr.  She says her last colonoscopy was > 10 yrs ago in [place].  She has not seen GI here.  She will need an outpt referral.  Check iron studies.  6.  Elevated d dimer:  CTA neg for PE.  Signed, Murray Hodgkins NP  Patient seen and examined. Agree with assessment and plan. Breathing well. F/u CBC being drawn and iron studies. If iron studies are normal recommend assessment for hemoglobinathy ? Thal trait.  Will start oral lasix daily. F/U lab next week and PA f/u with transition of care.   Troy Sine, MD, [facility] 08/07/2014 10:37 AM

## 2014-08-07 NOTE — Progress Notes (Signed)
Pt discharged to home via w/c volunteer in no s/s of distress. Graceann Congress

## 2014-08-07 NOTE — Telephone Encounter (Signed)
New Msg          Pt has TCM appt on 08/14/14 with Richardson Dopp per Ignacia Bayley.

## 2014-08-07 NOTE — Discharge Instructions (Signed)
***  PLEASE REMEMBER TO BRING ALL OF YOUR MEDICATIONS TO EACH OF YOUR FOLLOW-UP OFFICE VISITS.  

## 2014-08-08 ENCOUNTER — Other Ambulatory Visit: Payer: Self-pay | Admitting: Internal Medicine

## 2014-08-08 DIAGNOSIS — D509 Iron deficiency anemia, unspecified: Secondary | ICD-10-CM

## 2014-08-08 NOTE — Telephone Encounter (Signed)
Patient contacted regarding discharge from [facility] on August 07, 2014.  Patient understands to follow up with provider Ignacia Bayley, NP on February 9, 16 at 8:00 AM at [facility] location. Patient understands discharge instructions? yes Patient understands medications and regiment? yes Patient understands to bring all medications to this visit? yes

## 2014-08-13 NOTE — Progress Notes (Signed)
Cardiology Office Note   Date:  08/14/2014   ID:  Chelsea Keller, DOB Mar 28, 1935, MRN [id number]  PCP:  Chelsea Simpler, MD  Cardiologist:  Dr. Lauree Chandler     Chief Complaint  Patient presents with  . Hospitalization Follow-up  . Congestive Heart Failure  . Anemia     History of Present Illness: Chelsea Keller is a 80 y.o. female with a hx of obesity, HTN and chronic lower extremity edema. She has a remote history of tobacco abuse. She has had a normal myoview stress test in April of 2008. Her echo in May 2010 showed normal LV function and no significant valvular issues.   Seen by Dr. Lauree Chandler in 2013.  She was then admitted from the office 1/29 with c/o progressive failure to thrive with weakness, dizziness, dyspnea for the last 3 months. She had a sensation of impending doom.  She appeared to be volume overloaded and was diuresed.  She had an elevated creatinine and low K+ (2.8).  K+ was supplemented and she was placed on Spironolactone.  BNP was normal (42.5).  CEs remained negative.  Echo demonstrated normal LVF.  DDimer was elevated but the CT was negative for PE.   She was found to be anemic with Hgb of 8.7 (microcytic hypochromic).  Fe studies were pending at DC (Fe 23, TIBC 410, Ferritin 8, Folate 7.8, B12 1902).    She tells me that she feels the same as she did when she went to the hospital last week.   She remains short of breath with minimal activity. She is NYHA 3. She sleeps on 2 pillows chronically without change. She denies PND. She has chronic LE edema. This is unchanged. It is usually better in the mornings. She  has chest discomfort. This is described as a tightness. She's had symptoms for over a year without significant change. She denies exertional symptoms, associated radiation, nausea, diaphoresis. She denies syncope.   She denies melena, hematochezia.   Studies/Reports Reviewed Today: Echocardiogram 08/04/14 - Mild concentric hypertrophy.  Systolic function was normal. Wall motion was normal. Grade 1 diastolic dysfunction - Mitral valve: Calcified annulus.  Chest CTA 08/06/14 Limited exam due to body habitus and respiratory motion. No large or central pulmonary emboli identified as discussed above. Single minimally enlarged subcarinal lymph node.   Past Medical History  Diagnosis Date  . Chronic venous insufficiency     chronic LE edema  . Hypertriglyceridemia   . Hypertension   . Arthritis   . Allergy   . Impaired fasting glucose   . Hx of colonic polyps   . PUD (peptic ulcer disease)     hx of gastric ulcer  . Vitamin B12 deficiency   . Fibromyalgia     constant pain  . Anxiety   . Depression   . Chronic diastolic CHF (congestive heart failure)     a. Echo 1/16:  mild LVH, EF normal, grade 1 DD, MAC  . Hx of cardiovascular stress test     a. Nuclear study in 2008 normal  . Hx of cardiac catheterization     a. LHC in [place] "ok" per patient with mild plaque in a single vessel - records not available    Past Surgical History  Procedure Laterality Date  . Cholecystectomy    . Tonsillectomy    . External fixation wrist fracture  1985    left with pins  . External fixation ankle fracture      Fx.  left  ankle-fixation with pins later removed sec to infection 1985  . Cataract extraction  10/2003    OD  . Abdominal hysterectomy    . Hemiarthroplasty shoulder fracture  06/2009  . Femur fracture surgery  06/2009  . Total knee arthroplasty  03/09    left  . Tonsillectomy    . Fracture surgery    . Eye surgery    . Joint replacement    . Colonoscopy w/ polypectomy    . Hardware removal Left 10/05/2013    Procedure: HARDWARE REMOVAL LEFT DISTAL FEMUR;  Surgeon: Rozanna Box, MD;  Location: [place];  Service: Orthopedics;  Laterality: Left;  . Steriod injection Right 10/05/2013    Procedure: STEROID INJECTION;  Surgeon: Rozanna Box, MD;  Location: [place];  Service: Orthopedics;  Laterality: Right;     Current  Outpatient Prescriptions  Medication Sig Dispense Refill  . acetaminophen (TYLENOL) 500 MG tablet Take 1,000 mg by mouth at bedtime as needed for mild pain or moderate pain.     . furosemide (LASIX) 40 MG tablet Take 1 tablet (40 mg total) by mouth daily. 30 tablet   . spironolactone (ALDACTONE) 25 MG tablet Take 1 tablet (25 mg total) by mouth daily. 30 tablet 6  . temazepam (RESTORIL) 15 MG capsule Take 1-2 capsules (15-30 mg total) by mouth at bedtime as needed for sleep. 90 capsule 0  . traMADol (ULTRAM) 50 MG tablet Take 50 mg by mouth at bedtime as needed for moderate pain or severe pain.     . vitamin B-12 (CYANOCOBALAMIN) 1000 MCG tablet Take 1,000 mcg by mouth daily.     No current facility-administered medications for this visit.    Allergies:   Codeine sulfate; Celecoxib; Contrast media; and Erythromycin base    Social History:  The patient  reports that she quit smoking about 21 years ago. Her smoking use included Cigarettes. She has a 40 pack-year smoking history. She has never used smokeless tobacco. She reports that she does not drink alcohol or use illicit drugs.   Family History:  The patient's family history includes Arthritis in her brother; Asthma in her brother; Cancer in her brother and mother; Depression in her mother; Diabetes in her maternal aunt; Heart attack in her father; Stroke in her maternal grandmother.    ROS:  Please see the history of present illness.   Otherwise, review of systems are positive for fatigue, depression, back pain, myalgias, psoriasis plaques, easy bruising, dizziness.   All other systems are reviewed and negative.    PHYSICAL EXAM: VS:  BP 132/60 mmHg  Pulse 84  Ht 5' (1.524 m)  Wt 276 lb (125.193 kg)  BMI 53.90 kg/m2    Wt Readings from Last 3 Encounters:  08/14/14 276 lb (125.193 kg)  08/07/14 263 lb 14.3 oz (119.7 kg)  08/03/14 264 lb (119.75 kg)     GEN: Well nourished, well developed, in no acute distress HEENT:  normal Neck: I cannot appreciate JVD, no carotid bruits, no masses Cardiac:  Normal S1/S2, RRR; no murmur, no rubs or gallops, 1+ edema  Respiratory:  clear to auscultation bilaterally, no wheezing, rhonchi or rales. GI: soft, nontender, nondistended, + BS MS: no deformity or atrophy Skin: warm and dry  Neuro:  CNs II-XII intact, Strength and sensation are intact Psych: Normal affect   EKG:  EKG is ordered today.  It demonstrates:   NSR, HR 84, left axis deviation, low voltage, no change from prior tracing   Recent  Labs: 08/03/2014: ALT 14; B Natriuretic Peptide 42.5; TSH 1.544 08/04/2014: Magnesium 1.8 08/07/2014: BUN 27*; Creatinine 1.13*; Hemoglobin 8.6*; Platelets 375; Potassium 4.0; Sodium 139    Lipid Panel    Component Value Date/Time   CHOL 166 09/04/2013 1134   TRIG 145.0 09/04/2013 1134   HDL 46.10 09/04/2013 1134   CHOLHDL 4 09/04/2013 1134   VLDL 29.0 09/04/2013 1134   LDLCALC 91 09/04/2013 1134   LDLDIRECT 86.2 05/25/2008 1115      ASSESSMENT AND PLAN:  1.  Chronic Diastolic CHF:  Overall, her volume appears to be stable. I believe that her shortness of breath is likely attributed to her significant anemia. Weights in our office appear to be unreliable. She is now on Lasix 40 mg and spironolactone 25 mg daily.    -  Labs today: Basic metabolic panel, BNP    -  If BNP significantly elevated, adjust Lasix. 2.  Iron Deficiency Anemia:  In looking at her hemoglobin, this is significantly changed since 2012.    -  Start iron sulfate 325 mg 3 times a day.    -  Start Protonix 40 mg daily.    -  Refer to GI.  Defer decision regarding whether to continue PPI long-term.    -  Check follow-up CBC. 3.  Hypokalemia:  Check basic metabolic panel.  Continue spironolactone. 4.  Hypertension:  Controlled. 5.  Chest pain: Atypical. She tells me she had a cardiac catheterization in [place] in 2004. She was told she had minimal plaque. She had a negative nuclear stress test in 2008.  Aggressive invasive evaluation would be limited by her current anemia.  I am suspicious of a GI etiology.  I would hold off on any further testing at this time. Start PPI as outlined above.  Current medicines are reviewed at length with the patient today.  The patient does not have concerns regarding medicines.  The following changes have been made:  As above.    Labs/ tests ordered today include:  Orders Placed This Encounter  Procedures  . Basic Metabolic Panel (BMET)  . CBC w/Diff  . B Nat Peptide  . Ambulatory referral to Gastroenterology  . EKG 12-Lead     Disposition:   FU with Dr. Lauree Chandler  in 3 weeks.   Signed, Chelsea Keller, MHS 08/14/2014 8:45 AM    [facility] [place], [place],   [address] Phone: [phone]; Fax: [phone]

## 2014-08-14 ENCOUNTER — Encounter: Payer: Self-pay | Admitting: Physician Assistant

## 2014-08-14 ENCOUNTER — Telehealth: Payer: Self-pay | Admitting: *Deleted

## 2014-08-14 ENCOUNTER — Ambulatory Visit (INDEPENDENT_AMBULATORY_CARE_PROVIDER_SITE_OTHER): Payer: Medicare Other | Admitting: Physician Assistant

## 2014-08-14 VITALS — BP 132/60 | HR 84 | Ht 60.0 in | Wt 276.0 lb

## 2014-08-14 DIAGNOSIS — I10 Essential (primary) hypertension: Secondary | ICD-10-CM

## 2014-08-14 DIAGNOSIS — I5032 Chronic diastolic (congestive) heart failure: Secondary | ICD-10-CM

## 2014-08-14 DIAGNOSIS — R0602 Shortness of breath: Secondary | ICD-10-CM | POA: Diagnosis not present

## 2014-08-14 DIAGNOSIS — E876 Hypokalemia: Secondary | ICD-10-CM | POA: Diagnosis not present

## 2014-08-14 DIAGNOSIS — D509 Iron deficiency anemia, unspecified: Secondary | ICD-10-CM

## 2014-08-14 DIAGNOSIS — R079 Chest pain, unspecified: Secondary | ICD-10-CM

## 2014-08-14 LAB — BASIC METABOLIC PANEL
BUN: 16 mg/dL (ref 6–23)
CO2: 30 mEq/L (ref 19–32)
Calcium: 9 mg/dL (ref 8.4–10.5)
Chloride: 102 mEq/L (ref 96–112)
Creatinine, Ser: 0.86 mg/dL (ref 0.40–1.20)
GFR: 67.86 mL/min (ref >60.00)
Glucose, Bld: 107 mg/dL — ABNORMAL HIGH (ref 70–99)
POTASSIUM: 3.1 meq/L — AB (ref 3.5–5.1)
Sodium: 139 mEq/L (ref 135–145)

## 2014-08-14 LAB — CBC WITH DIFFERENTIAL/PLATELET
BASOS ABS: 0.1 10*3/uL (ref 0.0–0.1)
Basophils Relative: 0.5 % (ref 0.0–3.0)
EOS ABS: 0.6 10*3/uL (ref 0.0–0.7)
Eosinophils Relative: 5.1 % — ABNORMAL HIGH (ref 0.0–5.0)
HEMATOCRIT: 26.1 % — AB (ref 36.0–46.0)
LYMPHS ABS: 2.2 10*3/uL (ref 0.7–4.0)
Lymphocytes Relative: 19.9 % (ref 12.0–46.0)
MCHC: 31.1 g/dL (ref 30.0–36.0)
MCV: 66.1 fl — ABNORMAL LOW (ref 78.0–100.0)
Monocytes Absolute: 1 10*3/uL (ref 0.1–1.0)
Monocytes Relative: 9.4 % (ref 3.0–12.0)
NEUTROS ABS: 7.2 10*3/uL (ref 1.4–7.7)
Neutrophils Relative %: 65.1 % (ref 43.0–77.0)
Platelets: 426 10*3/uL — ABNORMAL HIGH (ref 150.0–400.0)
RBC: 3.95 Mil/uL (ref 3.87–5.11)
RDW: 20.8 % — AB (ref 11.5–15.5)
WBC: 11 10*3/uL — ABNORMAL HIGH (ref 4.0–10.5)

## 2014-08-14 LAB — BRAIN NATRIURETIC PEPTIDE: Pro B Natriuretic peptide (BNP): 58 pg/mL (ref 0.0–100.0)

## 2014-08-14 MED ORDER — POTASSIUM CHLORIDE CRYS ER 20 MEQ PO TBCR: 20.0000 meq | EXTENDED_RELEASE_TABLET | Freq: Two times a day (BID) | ORAL | Status: DC

## 2014-08-14 MED ORDER — IRON 325 (65 FE) MG PO TABS: 325.0000 mg | ORAL_TABLET | Freq: Three times a day (TID) | ORAL | Status: DC

## 2014-08-14 MED ORDER — PANTOPRAZOLE SODIUM 40 MG PO TBEC: 40.0000 mg | DELAYED_RELEASE_TABLET | Freq: Every day | ORAL | Status: DC

## 2014-08-14 NOTE — Telephone Encounter (Signed)
pt notified about lab results and to start K+ 20 meq bid, bmet 2/17, make sure to keep her f/u w/GI. Pt verbalized understanding to plan of care.

## 2014-08-14 NOTE — Patient Instructions (Signed)
Your physician has recommended you make the following change in your medication:  1. START IRON SUPPLEMENT 325 MG 1 TABLET THREE TIMES DAILY 2. START PROTONIX 40 MG 1 CAP DAILY # 30 X 1 , YOU WILL NEED TO DISCUSS WITH GI IN REGARDS IF YOU WILL NEED TO CONTINUE ON PROTONIX  You have been referred to DR. DANIEL JACOBS WITH GASTROENTEROLOGY ASAP PER SCOTT WEAVER, PAC; DX IRON DEF ANEMIA  LAB WORK TODAY; BMET, CBC W/DIFF, BNP  FOLLOW UP WITH DR. Angelena Form IN ABOUT 3 WEEKS OR SCOTT WEAVER,PAC SAME DAY DR. Angelena Form IS IN THE OFFICE

## 2014-08-16 ENCOUNTER — Other Ambulatory Visit: Payer: Self-pay

## 2014-08-16 ENCOUNTER — Encounter: Payer: Self-pay | Admitting: Nurse Practitioner

## 2014-08-16 ENCOUNTER — Ambulatory Visit (HOSPITAL_COMMUNITY)
Admission: RE | Admit: 2014-08-16 | Discharge: 2014-08-16 | Disposition: A | Discharge status: Home / Self Care | Payer: Medicare Other | Source: Ambulatory Visit | Attending: Gastroenterology | Admitting: Gastroenterology

## 2014-08-16 ENCOUNTER — Ambulatory Visit (INDEPENDENT_AMBULATORY_CARE_PROVIDER_SITE_OTHER): Payer: Medicare Other | Admitting: Nurse Practitioner

## 2014-08-16 VITALS — BP 152/57 | HR 89 | Temp 98.0°F | Resp 24

## 2014-08-16 DIAGNOSIS — Z86010 Personal history of colonic polyps: Secondary | ICD-10-CM

## 2014-08-16 DIAGNOSIS — Z6841 Body Mass Index (BMI) 40.0 and over, adult: Secondary | ICD-10-CM | POA: Insufficient documentation

## 2014-08-16 DIAGNOSIS — D62 Acute posthemorrhagic anemia: Secondary | ICD-10-CM | POA: Insufficient documentation

## 2014-08-16 DIAGNOSIS — D509 Iron deficiency anemia, unspecified: Secondary | ICD-10-CM | POA: Diagnosis not present

## 2014-08-16 LAB — PREPARE RBC (CROSSMATCH)

## 2014-08-16 MED ORDER — FUROSEMIDE 10 MG/ML IJ SOLN
20.0000 mg | Freq: Once | INTRAMUSCULAR | Status: AC
  Administered 2014-08-16: 20 mg via INTRAVENOUS
  Filled 2014-08-16: qty 2

## 2014-08-16 MED ORDER — SODIUM CHLORIDE 0.9 % IV SOLN
Freq: Once | INTRAVENOUS | Status: AC
  Administered 2014-08-16: 11:00:00 via INTRAVENOUS

## 2014-08-16 NOTE — Progress Notes (Signed)
Patient ID: Chelsea Keller, female   DOB: March 29, 1935, 80 y.o.   MRN: [id number]  Diagnosis of anemia. Pt arrived via wheelchair. Alert, reports weakness and dyspnea; IV access obtained, type and cross done.  Two units of blood transfused. Lasix given in between units of blood with positive result.   Pt tolerated procedure well, VS stable through out procedure and post-procedure.   Upon completion discharged to home. [name], Eustaquio Maize

## 2014-08-16 NOTE — Progress Notes (Signed)
HPI :  Patient is a 80 year old female referred by Cardiology, Richardson Dopp, P.A for anemia.   Patient has a history of congestive heart failure. She was hospitalized late last month with weakness and dyspnea. D-dimer was elevated but CTA of the chest with contrast was negative for pulmonary embolism. Echocardiogram showed a normal LV function with grade 1 diastolic dysfunction. During that admission patient was found to have microcytic anemia.  Her hgb had dropped from 10.7 in March 2015 to 8.8 with an MCV of 68. At hospital follow-up few days until cardiology started patient on a PPI and iron. Most recent hemoglobin stable at 8.6.   Patient had no overt bleeding or black stools. She has a history of colon polyps for which she underwent several colonoscopies while living in [place]. Her last colonoscopy was 12-15 years ago. Patient moved [facility] proximally 10 years ago, she has not had surveillance colonoscopies. She gives a very remote history of a bleeding gastric ulcer. No NSAID use. Patient has no abdominal pain. Some nausea but not debilitating. No unusual weight loss.  Patient is in a wheelchair. Normally she is ambulatory she has been too weak to ambulate very far over the last several weeks. She gets short of breath with minimal exertion  Past Medical History  Diagnosis Date  . Chronic venous insufficiency     chronic LE edema  . Hypertriglyceridemia   . Hypertension   . Arthritis   . Allergy   . Impaired fasting glucose   . Hx of colonic polyps   . PUD (peptic ulcer disease)     hx of gastric ulcer  . Vitamin B12 deficiency   . Fibromyalgia     constant pain  . Anxiety   . Depression   . Chronic diastolic CHF (congestive heart failure)     a. Echo 1/16:  mild LVH, EF normal, grade 1 DD, MAC  . Hx of cardiovascular stress test     a. Nuclear study in 2008 normal  . Hx of cardiac catheterization     a. LHC in [place] "ok" per patient with mild plaque in a single  vessel - records not available     Family History  Problem Relation Age of Onset  . Depression Mother   . Cancer Mother     uterine cancer  . Cancer Brother     prostate cancer  . Diabetes Maternal Aunt   . Arthritis Brother   . Asthma Brother   . Stroke Maternal Grandmother   . Heart attack Father    History  Substance Use Topics  . Smoking status: Former Smoker -- 1.00 packs/day for 40 years    Types: Cigarettes    Quit date: 07/06/1993  . Smokeless tobacco: Never Used  . Alcohol Use: No   Current Outpatient Prescriptions  Medication Sig Dispense Refill  . acetaminophen (TYLENOL) 500 MG tablet Take 1,000 mg by mouth at bedtime as needed for mild pain or moderate pain.     . Ferrous Sulfate (IRON) 325 (65 FE) MG TABS Take 325 mg by mouth 3 (three) times daily. 90 each 0  . furosemide (LASIX) 40 MG tablet Take 1 tablet (40 mg total) by mouth daily. 30 tablet   . pantoprazole (PROTONIX) 40 MG tablet Take 1 tablet (40 mg total) by mouth daily. 30 tablet 1  . potassium chloride SA (K-DUR,KLOR-CON) 20 MEQ tablet Take 1 tablet (20 mEq total) by mouth 2 (two) times daily.    Marland Kitchen  spironolactone (ALDACTONE) 25 MG tablet Take 1 tablet (25 mg total) by mouth daily. 30 tablet 6  . temazepam (RESTORIL) 15 MG capsule Take 1-2 capsules (15-30 mg total) by mouth at bedtime as needed for sleep. 90 capsule 0  . traMADol (ULTRAM) 50 MG tablet Take 50 mg by mouth at bedtime as needed for moderate pain or severe pain.     . vitamin B-12 (CYANOCOBALAMIN) 1000 MCG tablet Take 1,000 mcg by mouth daily.     No current facility-administered medications for this visit.   Allergies  Allergen Reactions  . Codeine Sulfate Shortness Of Breath  . Celecoxib Other (See Comments)    Pt bled out  . Contrast Media [Iodinated Diagnostic Agents] Other (See Comments)    "Went numb, nausea and vomiting"  . Erythromycin Base Nausea And Vomiting     Review of Systems: Multiple positives:  sinus /  allergy,  anxiety, arthritis, cough, depression, fatigue, itching, muscle pain, nosebleeds, shortness of breath, skin rash, sleep problems, swelling of the feet and legs, excessive urination, frequency of urination, and urine leakage. All systems reviewed and negative except where noted in HPI.    Physical Exam: BP 144/78 mmHg  Pulse 88  Ht 5' (1.524 m)  Wt 276 lb (125.193 kg)  BMI 53.90 kg/m2 Constitutional: Pleasant, morbidly obese, white female in a wheelchair in no acute distress. HEENT: Normocephalic and atraumatic. Conjunctivae are normal. No scleral icterus. Neck supple.  Cardiovascular: Normal rate, regular rhythm.  Pulmonary/chest: Effort normal and breath sounds normal. No wheezing, rales or rhonchi. Abdominal: Suboptimal exam, patient is in a wheelchair today  Abdomen is soft, obese, nontender. Bowel sounds active throughout.  Extremities: Bilateral lower extremities with pitting edema, skin discoloration / thickening.  Lymphadenopathy: No cervical adenopathy noted. Neurological: Alert and oriented to person place and time. Skin: Skin is warm and dry. No rashes noted. Psychiatric: Normal mood and affect. Behavior is normal.   ASSESSMENT AND PLAN:    80 year old female with new microcytic anemia (ferritin 8, hgb in 8 range). No overt bleeding. Mild nausea, no other GI symptoms.    Patient needs blood. She is symptomatic with shortness or breath with mild exertion. Will arrange for 2 units of blood + lasix.  Oral iron recently started. Continue iron. Patient plans to start MiraLAX for iron induced constipation   For further evaluation of anemia patient will be scheduled for both EGD and colonoscopy at hospital with MAC. She gives a history of recurrent colon polyps in [place] greater than 10 years ago. We are requesting those records as it sounds like patient multiple polyps were found at time of last colonoscopy. The risks, benefits, and alternatives to EGD and colonoscopy with possible  biopsy and possible polypectomy were discussed with the patient and she consents to proceed.    I will see the patient back prior to procedures to make sure she is medically stable to proceed. We will discuss prep at that time. Patient tells me she's never been able to complete a prep secondary to vomiting. She will need 2 days of clear liquids and Reglan for the procedure. Will discuss further at next visit.

## 2014-08-16 NOTE — Progress Notes (Signed)
Reviewed and agree with management plan.  Dilyn Smiles T. Olivier Frayre, MD FACG 

## 2014-08-16 NOTE — Patient Instructions (Addendum)
Please go to [facility] on the 3rd floor -  for 2 units of blood today You have been scheduled for an upper endoscopy and colonoscopy for 10/15/14 9:30 with Dr. Fuller Plan at [facility] your iron I will contact you in a few weeks to schedule a follow up with Tye Savoy RNP - you will be given instructions for your upcoming procedure at that time.

## 2014-08-17 LAB — TYPE AND SCREEN
ABO/RH(D): B POS
ANTIBODY SCREEN: NEGATIVE
UNIT DIVISION: 0
Unit division: 0

## 2014-08-22 ENCOUNTER — Other Ambulatory Visit (INDEPENDENT_AMBULATORY_CARE_PROVIDER_SITE_OTHER): Payer: Medicare Other | Admitting: *Deleted

## 2014-08-22 DIAGNOSIS — I5032 Chronic diastolic (congestive) heart failure: Secondary | ICD-10-CM

## 2014-08-22 LAB — BASIC METABOLIC PANEL
BUN: 11 mg/dL (ref 6–23)
CHLORIDE: 102 meq/L (ref 96–112)
CO2: 29 meq/L (ref 19–32)
CREATININE: 0.89 mg/dL (ref 0.40–1.20)
Calcium: 9.3 mg/dL (ref 8.4–10.5)
GFR: 65.23 mL/min (ref >60.00)
Glucose, Bld: 145 mg/dL — ABNORMAL HIGH (ref 70–99)
Potassium: 3.4 mEq/L — ABNORMAL LOW (ref 3.5–5.1)
Sodium: 139 mEq/L (ref 135–145)

## 2014-08-24 ENCOUNTER — Telehealth: Payer: Self-pay | Admitting: *Deleted

## 2014-08-24 DIAGNOSIS — I5032 Chronic diastolic (congestive) heart failure: Secondary | ICD-10-CM

## 2014-08-24 DIAGNOSIS — E876 Hypokalemia: Secondary | ICD-10-CM

## 2014-08-24 MED ORDER — POTASSIUM CHLORIDE CRYS ER 20 MEQ PO TBCR: 40.0000 meq | EXTENDED_RELEASE_TABLET | Freq: Two times a day (BID) | ORAL | Status: DC

## 2014-08-24 NOTE — Telephone Encounter (Signed)
pt notified about lab results and to increase K+ to 40 meq bid, bmet in 1 week. pt wants to know can she get lab on 3/2 when she see's PA. I advised will d/w Scott and cb to let her know, pt said ok and thank you

## 2014-09-03 ENCOUNTER — Telehealth: Payer: Self-pay

## 2014-09-03 ENCOUNTER — Telehealth: Payer: Self-pay | Admitting: Cardiology

## 2014-09-03 NOTE — Telephone Encounter (Signed)
Left message for patient to call back  

## 2014-09-03 NOTE — Telephone Encounter (Signed)
appt scheduled with the patient for 09/11/14 10:30 with Tye Savoy RNP

## 2014-09-03 NOTE — Telephone Encounter (Signed)
-----   Message from Kellie Moor, RN sent at 08/16/2014  9:31 AM EST ----- Needs follow up with Tye Savoy RNP.  Needs given instructions for endo/colon at [facility] on 10/15/14 with Dr. Fuller Plan for 9:30 arrive at 8:00

## 2014-09-03 NOTE — Telephone Encounter (Signed)
Paged by Daughter regarding patient. Daughter reports that patient has been feeling run down and has been having trouble breathing. Worse with exertion. Temp to 102.74F. I recommended that the patient be take to the ER to be evaluated tonight. Daughter is in agreement.

## 2014-09-04 ENCOUNTER — Encounter: Payer: Self-pay | Admitting: Internal Medicine

## 2014-09-04 ENCOUNTER — Telehealth: Payer: Self-pay | Admitting: Nurse Practitioner

## 2014-09-04 ENCOUNTER — Ambulatory Visit (INDEPENDENT_AMBULATORY_CARE_PROVIDER_SITE_OTHER): Payer: Medicare Other | Admitting: Internal Medicine

## 2014-09-04 ENCOUNTER — Ambulatory Visit (INDEPENDENT_AMBULATORY_CARE_PROVIDER_SITE_OTHER)
Admission: RE | Admit: 2014-09-04 | Discharge: 2014-09-04 | Disposition: A | Discharge status: Home / Self Care | Payer: Medicare Other | Source: Ambulatory Visit | Attending: Internal Medicine | Admitting: Internal Medicine

## 2014-09-04 VITALS — BP 128/70 | HR 80 | Temp 98.0°F | Resp 18 | Wt 272.8 lb

## 2014-09-04 DIAGNOSIS — R509 Fever, unspecified: Secondary | ICD-10-CM | POA: Insufficient documentation

## 2014-09-04 DIAGNOSIS — I5032 Chronic diastolic (congestive) heart failure: Secondary | ICD-10-CM | POA: Diagnosis not present

## 2014-09-04 DIAGNOSIS — D509 Iron deficiency anemia, unspecified: Secondary | ICD-10-CM | POA: Diagnosis not present

## 2014-09-04 LAB — POCT URINALYSIS DIPSTICK
BILIRUBIN UA: NEGATIVE
Blood, UA: NEGATIVE
GLUCOSE UA: NEGATIVE
Ketones, UA: NEGATIVE
LEUKOCYTES UA: NEGATIVE
NITRITE UA: NEGATIVE
Protein, UA: NEGATIVE
Spec Grav, UA: 1.02
UROBILINOGEN UA: NEGATIVE
pH, UA: 6

## 2014-09-04 LAB — CBC WITH DIFFERENTIAL/PLATELET
BASOS ABS: 0 10*3/uL (ref 0.0–0.1)
Basophils Relative: 0.4 % (ref 0.0–3.0)
Eosinophils Absolute: 0.1 10*3/uL (ref 0.0–0.7)
Eosinophils Relative: 1.5 % (ref 0.0–5.0)
HCT: 34.3 % — ABNORMAL LOW (ref 36.0–46.0)
Hemoglobin: 11 g/dL — ABNORMAL LOW (ref 12.0–15.0)
LYMPHS PCT: 17.9 % (ref 12.0–46.0)
Lymphs Abs: 1.5 10*3/uL (ref 0.7–4.0)
MCHC: 32.2 g/dL (ref 30.0–36.0)
MCV: 77.5 fl — ABNORMAL LOW (ref 78.0–100.0)
Monocytes Absolute: 0.8 10*3/uL (ref 0.1–1.0)
Monocytes Relative: 9.3 % (ref 3.0–12.0)
Neutro Abs: 6 10*3/uL (ref 1.4–7.7)
Neutrophils Relative %: 70.9 % (ref 43.0–77.0)
PLATELETS: 292 10*3/uL (ref 150.0–400.0)
RBC: 4.42 Mil/uL (ref 3.87–5.11)
RDW: 31 % — AB (ref 11.5–15.5)
WBC: 8.5 10*3/uL (ref 4.0–10.5)

## 2014-09-04 LAB — RENAL FUNCTION PANEL
Albumin: 3.4 g/dL — ABNORMAL LOW (ref 3.5–5.2)
BUN: 13 mg/dL (ref 6–23)
CHLORIDE: 102 meq/L (ref 96–112)
CO2: 30 mEq/L (ref 19–32)
CREATININE: 1.07 mg/dL (ref 0.40–1.20)
Calcium: 8.9 mg/dL (ref 8.4–10.5)
GFR: 52.73 mL/min — ABNORMAL LOW (ref >60.00)
Glucose, Bld: 109 mg/dL — ABNORMAL HIGH (ref 70–99)
PHOSPHORUS: 3.2 mg/dL (ref 2.3–4.6)
Potassium: 3.9 mEq/L (ref 3.5–5.1)
Sodium: 136 mEq/L (ref 135–145)

## 2014-09-04 NOTE — Progress Notes (Signed)
Pre visit review using our clinic review tool, if applicable. No additional management support is needed unless otherwise documented below in the visit note. 

## 2014-09-04 NOTE — Progress Notes (Signed)
Cardiology Office Note   Date:  09/05/2014   ID:  Chelsea Keller, DOB 1934-07-21, MRN [id number]  PCP:  Viviana Simpler, MD  Cardiologist:  Dr. Lauree Chandler     Chief Complaint  Patient presents with  . Congestive Heart Failure     History of Present Illness: Chelsea Keller is a 80 y.o. female with a hx of obesity, HTN and chronic lower extremity edema. She has a remote history of tobacco abuse. She has had a normal myoview stress test in April of 2008. Her echo in May 2010 showed normal LV function and no significant valvular issues.   Seen by Dr. Lauree Chandler in 2013.  She was then admitted from the office 1/29 with c/o progressive failure to thrive with weakness, dizziness, dyspnea for the last 3 months. She had a sensation of impending doom.  She appeared to be volume overloaded and was diuresed.  She had an elevated creatinine and low K+ (2.8).  K+ was supplemented and she was placed on Spironolactone.  BNP was normal (42.5).  CEs remained negative.  Echo demonstrated normal LVF.  DDimer was elevated but the CT was negative for PE.   She was found to be anemic with Hgb of 8.7 (microcytic hypochromic).  Fe studies were pending at DC (Fe 23, TIBC 410, Ferritin 8, Folate 7.8, B12 1902).    I saw her in FU 08/14/13. She remained SOB but I suspected this was related to worsening iron deficiency anemia.  She was referred to GI and had a transfusion with PRBCs.  EGD and colonoscopy are pending.  She returns for FU.  She had significant improvement in her breathing after her transfusion. However, over the last week or so she notes getting worse. She feels similar to how she felt prior to her transfusion. Hemoglobin drawn yesterday by PCP was in the 11 range. She denies any significant weight gain. She denies chest pain. She denies syncope. She denies any significant increase in LE edema. She denies PND.   Studies/Reports Reviewed Today:  Echocardiogram 08/04/14 - Mild concentric  hypertrophy. Systolic function was normal. Wall motion was normal. Grade 1 diastolic dysfunction - Mitral valve: Calcified annulus.  Chest CTA 08/06/14 Limited exam due to body habitus and respiratory motion. No large or central pulmonary emboli identified as discussed above. Single minimally enlarged subcarinal lymph node.   Past Medical History  Diagnosis Date  . Chronic venous insufficiency     chronic LE edema  . Hypertriglyceridemia   . Hypertension   . Arthritis   . Allergy   . Impaired fasting glucose   . Hx of colonic polyps   . PUD (peptic ulcer disease)     hx of gastric ulcer  . Vitamin B12 deficiency   . Fibromyalgia     constant pain  . Anxiety   . Depression   . Chronic diastolic CHF (congestive heart failure)     a. Echo 1/16:  mild LVH, EF normal, grade 1 DD, MAC  . Hx of cardiovascular stress test     a. Nuclear study in 2008 normal  . Hx of cardiac catheterization     a. LHC in [place] "ok" per patient with mild plaque in a single vessel - records not available    Past Surgical History  Procedure Laterality Date  . Cholecystectomy    . Tonsillectomy    . External fixation wrist fracture  1985    left with pins  . External fixation ankle fracture  Fx.  left ankle-fixation with pins later removed sec to infection 1985  . Cataract extraction  10/2003    OD  . Abdominal hysterectomy    . Hemiarthroplasty shoulder fracture  06/2009  . Femur fracture surgery  06/2009  . Total knee arthroplasty  03/09    left  . Tonsillectomy    . Fracture surgery    . Eye surgery    . Joint replacement    . Colonoscopy w/ polypectomy    . Hardware removal Left 10/05/2013    Procedure: HARDWARE REMOVAL LEFT DISTAL FEMUR;  Surgeon: Rozanna Box, MD;  Location: [facility];  Service: Orthopedics;  Laterality: Left;  . Steriod injection Right 10/05/2013    Procedure: STEROID INJECTION;  Surgeon: Rozanna Box, MD;  Location: [facility];  Service: Orthopedics;  Laterality: Right;      Current Outpatient Prescriptions  Medication Sig Dispense Refill  . acetaminophen (TYLENOL) 500 MG tablet Take 1,000 mg by mouth at bedtime as needed for mild pain or moderate pain.     . Ferrous Sulfate (IRON) 325 (65 FE) MG TABS Take 325 mg by mouth 3 (three) times daily. 90 each 0  . furosemide (LASIX) 40 MG tablet Take 1 tablet (40 mg total) by mouth daily. 30 tablet   . pantoprazole (PROTONIX) 40 MG tablet Take 1 tablet (40 mg total) by mouth daily. 30 tablet 1  . potassium chloride SA (K-DUR,KLOR-CON) 20 MEQ tablet Take 2 tablets (40 mEq total) by mouth 2 (two) times daily. 120 tablet 11  . spironolactone (ALDACTONE) 25 MG tablet Take 1 tablet (25 mg total) by mouth daily. 30 tablet 6  . temazepam (RESTORIL) 15 MG capsule Take 1-2 capsules (15-30 mg total) by mouth at bedtime as needed for sleep. 90 capsule 0  . traMADol (ULTRAM) 50 MG tablet Take 50 mg by mouth at bedtime as needed for moderate pain or severe pain.     . vitamin B-12 (CYANOCOBALAMIN) 1000 MCG tablet Take 1,000 mcg by mouth daily.     No current facility-administered medications for this visit.    Allergies:   Codeine sulfate; Celecoxib; Contrast media; and Erythromycin base    Social History:  The patient  reports that she quit smoking about 21 years ago. Her smoking use included Cigarettes. She has a 40 pack-year smoking history. She has never used smokeless tobacco. She reports that she does not drink alcohol or use illicit drugs.   Family History:  The patient's family history includes Arthritis in her brother; Asthma in her brother; Cancer in her brother and mother; Depression in her mother; Diabetes in her maternal aunt; Heart attack in her father; Stroke in her maternal grandmother.    ROS:  Please see the history of present illness.    Review of Systems  Constitution: Positive for fever and malaise/fatigue.  Cardiovascular: Positive for leg swelling.  Respiratory: Positive for shortness of breath and  wheezing.   Hematologic/Lymphatic: Bruises/bleeds easily.  Skin: Positive for rash.  Musculoskeletal: Positive for back pain, joint pain and muscle weakness.  Gastrointestinal: Positive for nausea.  Genitourinary:       Difficult urination  Neurological: Positive for dizziness and loss of balance.  Psychiatric/Behavioral: Positive for depression. The patient is nervous/anxious.   All other systems reviewed and are negative.    PHYSICAL EXAM: VS:  BP 130/60 mmHg  Pulse 76  Ht 5' (1.524 m)  Wt 273 lb (123.832 kg)  BMI 53.32 kg/m2    Wt Readings from  Last 3 Encounters:  09/05/14 273 lb (123.832 kg)  09/04/14 272 lb 12.8 oz (123.741 kg)  08/16/14 276 lb (125.193 kg)     GEN: Well nourished, well developed, in no acute distress HEENT: normal Neck: I cannot appreciate JVD, no masses Cardiac:  Normal S1/S2, RRR; no murmur, no rubs or gallops, trace-1+ edema  Respiratory:  Decreased breath sounds bilaterally, no wheezing, rhonchi or rales. GI: soft, nontender, nondistended, + BS MS: no deformity or atrophy Skin: warm and dry  Neuro:  CNs II-XII intact, Strength and sensation are intact Psych: Normal affect   EKG:  EKG is not ordered today.  It demonstrates:   n/a                                    Recent Labs: 08/03/2014: ALT 14; B Natriuretic Peptide 42.5; TSH 1.544 08/04/2014: Magnesium 1.8 08/14/2014: Pro B Natriuretic peptide (BNP) 58.0 09/04/2014: BUN 13; Creatinine 1.07; Hemoglobin 11.0*; Platelets 292.0; Potassium 3.9; Sodium 136    Lipid Panel    Component Value Date/Time   CHOL 166 09/04/2013 1134   TRIG 145.0 09/04/2013 1134   HDL 46.10 09/04/2013 1134   CHOLHDL 4 09/04/2013 1134   VLDL 29.0 09/04/2013 1134   LDLCALC 91 09/04/2013 1134   LDLDIRECT 86.2 05/25/2008 1115      ASSESSMENT AND PLAN:  1.  Chronic Diastolic CHF:  Overall, volume appears stable. Continue current therapy. 2.  Iron Deficiency Anemia:  She continues to be symptomatic. Recent hemoglobin  was much improved. However, question if she is starting to have worsening anemia based upon her symptoms. She sees GI soon. 3.  Hypokalemia:  Recent potassium normal 4.  Hypertension:   Controlled.   Current medicines are reviewed at length with the patient today.  The patient has concerns regarding medicines.  The following changes have been made:  As above.  Labs/ tests ordered today include:  No orders of the defined types were placed in this encounter.    Disposition:   FU with Dr. Lauree Chandler 3 mos.    Signed, Versie Starks, MHS 09/05/2014 8:48 AM    [facility] [address], [place],   [address] Phone: [phone]; Fax: [phone]

## 2014-09-04 NOTE — Assessment & Plan Note (Signed)
Will recheck CBC after transfusion Has GI follow up Considering small bowel study

## 2014-09-04 NOTE — Telephone Encounter (Signed)
Patient's daughter reports that she is having SOB and a fever of 102..  She is advised that she needs to see her primary care today for fever and SOB.  Daughter thinks that she should be seen here for the SOB.  I explained that while she was seen her for her anemia, she has been transfused with 2 units of blood.  I explained that the SOB combined with the fever points more to a resp issue and not her anemia.  She is again advised that she should see her primary care today.  They are also asking about having a blood count checked.  Chelsea Bloodgood do you want to get a CBC if they do not check one with her primary care?  She has follow up with you on Monday of next week

## 2014-09-04 NOTE — Assessment & Plan Note (Addendum)
Has dyspnea like with the CHF but fluid seems down still Mild urinary symptoms but urinalysis is fine CXR doesn't show pneumonia and CHF better from most recent film Will hold off on any Rx since no clear bacterial source

## 2014-09-04 NOTE — Progress Notes (Signed)
Subjective:    Patient ID: Chelsea Keller, female    DOB: 10/26/1934, 80 y.o.   MRN: [id number]  HPI Here with daughter "I feel like I am back at the beginning" Temp to 102.8 last night Considered the ER Did come down on its own to 100.5 last night Took some tylenol this morning  Went to [place] this past weekend-- Friday to Sunday Never feels fine despite this Breathing is getting worse again--like she is worsening  Slight cough---slight clear sputum No chest pain No palpitations Some dizziness  Also with urinary urgency, itching and some dribbling Worried about UTI  Current Outpatient Prescriptions on File Prior to Visit  Medication Sig Dispense Refill  . acetaminophen (TYLENOL) 500 MG tablet Take 1,000 mg by mouth at bedtime as needed for mild pain or moderate pain.     . Ferrous Sulfate (IRON) 325 (65 FE) MG TABS Take 325 mg by mouth 3 (three) times daily. 90 each 0  . furosemide (LASIX) 40 MG tablet Take 1 tablet (40 mg total) by mouth daily. 30 tablet   . pantoprazole (PROTONIX) 40 MG tablet Take 1 tablet (40 mg total) by mouth daily. 30 tablet 1  . potassium chloride SA (K-DUR,KLOR-CON) 20 MEQ tablet Take 2 tablets (40 mEq total) by mouth 2 (two) times daily. 120 tablet 11  . spironolactone (ALDACTONE) 25 MG tablet Take 1 tablet (25 mg total) by mouth daily. 30 tablet 6  . temazepam (RESTORIL) 15 MG capsule Take 1-2 capsules (15-30 mg total) by mouth at bedtime as needed for sleep. 90 capsule 0  . traMADol (ULTRAM) 50 MG tablet Take 50 mg by mouth at bedtime as needed for moderate pain or severe pain.     . vitamin B-12 (CYANOCOBALAMIN) 1000 MCG tablet Take 1,000 mcg by mouth daily.     No current facility-administered medications on file prior to visit.    Allergies  Allergen Reactions  . Codeine Sulfate Shortness Of Breath  . Celecoxib Other (See Comments)    Pt bled out  . Contrast Media [Iodinated Diagnostic Agents] Other (See Comments)    "Went numb,  nausea and vomiting"  . Erythromycin Base Nausea And Vomiting    Past Medical History  Diagnosis Date  . Chronic venous insufficiency     chronic LE edema  . Hypertriglyceridemia   . Hypertension   . Arthritis   . Allergy   . Impaired fasting glucose   . Hx of colonic polyps   . PUD (peptic ulcer disease)     hx of gastric ulcer  . Vitamin B12 deficiency   . Fibromyalgia     constant pain  . Anxiety   . Depression   . Chronic diastolic CHF (congestive heart failure)     a. Echo 1/16:  mild LVH, EF normal, grade 1 DD, MAC  . Hx of cardiovascular stress test     a. Nuclear study in 2008 normal  . Hx of cardiac catheterization     a. LHC in [place] "ok" per patient with mild plaque in a single vessel - records not available    Past Surgical History  Procedure Laterality Date  . Cholecystectomy    . Tonsillectomy    . External fixation wrist fracture  1985    left with pins  . External fixation ankle fracture      Fx.  left ankle-fixation with pins later removed sec to infection 1985  . Cataract extraction  10/2003    OD  .  Abdominal hysterectomy    . Hemiarthroplasty shoulder fracture  06/2009  . Femur fracture surgery  06/2009  . Total knee arthroplasty  03/09    left  . Tonsillectomy    . Fracture surgery    . Eye surgery    . Joint replacement    . Colonoscopy w/ polypectomy    . Hardware removal Left 10/05/2013    Procedure: HARDWARE REMOVAL LEFT DISTAL FEMUR;  Surgeon: Rozanna Box, MD;  Location: [facility];  Service: Orthopedics;  Laterality: Left;  . Steriod injection Right 10/05/2013    Procedure: STEROID INJECTION;  Surgeon: Rozanna Box, MD;  Location: [facility];  Service: Orthopedics;  Laterality: Right;    Family History  Problem Relation Age of Onset  . Depression Mother   . Cancer Mother     uterine cancer  . Cancer Brother     prostate cancer  . Diabetes Maternal Aunt   . Arthritis Brother   . Asthma Brother   . Stroke Maternal Grandmother   . Heart  attack Father     History   Social History  . Marital Status: Widowed    Spouse Name: N/A  . Number of Children: 4  . 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 Smoking status: Former Smoker -- 1.00 packs/day for 40 years    Types: Cigarettes    Quit date: 07/06/1993  . Smokeless tobacco: Never Used  . Alcohol Use: No  . Drug Use: No  . Sexual Activity: Not on file   Other Topics Concern  . Not on file   Social History Narrative   No living will   Plans to do health care POA forms---wants daughter Chelsea Keller   Would accept resuscitation attempts---no prolonged ventilation   Absolutely no feeding tube   Review of Systems Rash along nose and cheeks just started No vomiting. Nausea at times--not eating well Sleeps okay    Objective:   Physical Exam  Constitutional: No distress.  HENT:  Mouth/Throat: Oropharynx is clear and moist. No oropharyngeal exudate.  No sinus tenderness Slight nasal congestion TMs fine  Neck: Normal range of motion. Neck supple.  Pulmonary/Chest: Effort normal and breath sounds normal. No respiratory distress. She has no wheezes. She has no rales.  Abdominal: Soft. There is no tenderness.  Musculoskeletal:  Thick calves without pitting  Lymphadenopathy:    She has no cervical adenopathy.          Assessment & Plan:

## 2014-09-04 NOTE — Assessment & Plan Note (Addendum)
Seems compensated She is concerned about the anemia also Wonders about the aldactone--- explained it helps with fluid management

## 2014-09-05 ENCOUNTER — Ambulatory Visit (INDEPENDENT_AMBULATORY_CARE_PROVIDER_SITE_OTHER): Payer: Medicare Other | Admitting: Physician Assistant

## 2014-09-05 ENCOUNTER — Other Ambulatory Visit (INDEPENDENT_AMBULATORY_CARE_PROVIDER_SITE_OTHER): Payer: Medicare Other

## 2014-09-05 ENCOUNTER — Encounter: Payer: Self-pay | Admitting: Physician Assistant

## 2014-09-05 VITALS — BP 130/60 | HR 76 | Ht 60.0 in | Wt 273.0 lb

## 2014-09-05 DIAGNOSIS — E876 Hypokalemia: Secondary | ICD-10-CM | POA: Diagnosis not present

## 2014-09-05 DIAGNOSIS — I10 Essential (primary) hypertension: Secondary | ICD-10-CM | POA: Diagnosis not present

## 2014-09-05 DIAGNOSIS — D509 Iron deficiency anemia, unspecified: Secondary | ICD-10-CM

## 2014-09-05 DIAGNOSIS — I5032 Chronic diastolic (congestive) heart failure: Secondary | ICD-10-CM

## 2014-09-05 NOTE — Patient Instructions (Signed)
Schedule follow up with Dr. Lauree Chandler in 3 months.

## 2014-09-05 NOTE — Telephone Encounter (Signed)
Chelsea Keller, agree with your response. She did see PCP yesterday, no clear cut source for fever and repeat hgb up to 11.0.  Thanks

## 2014-09-11 ENCOUNTER — Ambulatory Visit (INDEPENDENT_AMBULATORY_CARE_PROVIDER_SITE_OTHER): Payer: Medicare Other | Admitting: Nurse Practitioner

## 2014-09-11 ENCOUNTER — Other Ambulatory Visit (INDEPENDENT_AMBULATORY_CARE_PROVIDER_SITE_OTHER): Payer: Medicare Other

## 2014-09-11 ENCOUNTER — Encounter: Payer: Self-pay | Admitting: Nurse Practitioner

## 2014-09-11 VITALS — BP 102/60 | HR 80 | Ht 60.0 in | Wt 272.2 lb

## 2014-09-11 DIAGNOSIS — Z86010 Personal history of colonic polyps: Secondary | ICD-10-CM

## 2014-09-11 DIAGNOSIS — D509 Iron deficiency anemia, unspecified: Secondary | ICD-10-CM

## 2014-09-11 LAB — IGA: IgA: 234 mg/dL (ref 68–378)

## 2014-09-11 MED ORDER — METOCLOPRAMIDE HCL 10 MG PO TABS: 10.0000 mg | ORAL_TABLET | Freq: Four times a day (QID) | ORAL | Status: DC

## 2014-09-11 MED ORDER — METOCLOPRAMIDE HCL 10 MG PO TABS: 10.0000 mg | ORAL_TABLET | Freq: Once | ORAL | Status: DC

## 2014-09-11 MED ORDER — ONDANSETRON HCL 4 MG PO TABS: 4.0000 mg | ORAL_TABLET | Freq: Four times a day (QID) | ORAL | Status: DC | PRN

## 2014-09-11 NOTE — Patient Instructions (Addendum)
You have been scheduled for an endoscopy and colonoscopy. Please follow the written instructions given to you at your visit today. Please pick up your prep supplies at the pharmacy within the next 1-3 days. If you use inhalers (even only as needed), please bring them with you on the day of your procedure.  Please have nothing to eat or drink after 2:00 AM.  We have sent the following medications to your pharmacy for you to pick up at your convenience: Zofran, reglan  Your physician has requested that you go to the basement for the following lab work before leaving today: IGA, TTG  I appreciate the opportunity to care for you.

## 2014-09-11 NOTE — Progress Notes (Signed)
     History of Present Illness:   Patient is a 80 year old female who I saw last month for microcytic anemia with an acute drop in hemoglobin in the absence of any overt bleeding. Patient gave a remote history of colon polyps ([place]). She has a remote history of gastric ulcers as well. Patient was quite symptomatic from her anemia when I saw her in clinic. I felt she would benefit from a blood transfusion followed by upper endoscopy and colonoscopy for evaluation of anemia. Following 2 units of blood her hemoglobin rose appropriately to 11. Patient is here today for a brief follow-up prior to endoscopic procedures.   Patient feels stronger after blood transfusion. She is able to ambulate with a rolling walker now. Still no overt bleeding. She is now on iron and stools are dark.   Current Medications, Allergies, Past Medical History, Past Surgical History, Family History and Social History were reviewed in [employer] record. Physical Exam: General: Pleasant, obese, white, female in no acute distress Head: Normocephalic and atraumatic Eyes:  sclerae anicteric, conjunctiva pink  Ears: Normal auditory acuity Lungs: Clear throughout to auscultation Heart: Regular rate and rhythm Abdomen: limited exam, patient in chair. Abdomen soft, non distended, non-tender. Normal bowel sounds Rectal: brown, heme positive stool Musculoskeletal: Symmetrical with no gross deformities  Extremities: No edema  Neurological: Alert oriented x 4, grossly nonfocal Psychological:  Alert and cooperative. Normal mood and affect  Assessment and Recommendations:  74. 80 year old female with iron deficiency anemia, guaiac positive stool. She had an appropriate rise in hemoglobin following 2 units of blood. Patient looks and feels better than when I saw her at time of our last visit in February. She is scheduled for EGD / colonoscopy 10/15/14.  The risks, benefits, and alternatives to EGD and  colonoscopy with possible biopsy and possible polypectomy were discussed with the patient and she consents to proceed.   Patient reports problems tolerating colon preps secondary to nausea and vomiting. For bowel prep patient will be on clear liquids for 2 days prior to procedure. Her bowel prep will include Dulcolax tablets, Mg+ citrate evening before procedure. Will give Reglan prior to starting prep and she can take Zofran as needed. Stop iron one week prior to procedure.   Iron malabsorption unlikely but family member present gives a family history of iron malabsorption problems. Will check TTG / Ig A  2. Multiple medical problems, morbid obesity    CC: Kela Millin, P.A

## 2014-09-12 LAB — TISSUE TRANSGLUTAMINASE, IGA: Tissue Transglutaminase Ab, IgA: 5 U/mL — ABNORMAL HIGH (ref <4)

## 2014-09-12 NOTE — Progress Notes (Signed)
Reviewed and agree with management plan.  Malcolm T. Stark, MD FACG 

## 2014-09-20 ENCOUNTER — Encounter: Payer: Self-pay | Admitting: Gastroenterology

## 2014-09-25 ENCOUNTER — Encounter: Payer: Medicare Other | Admitting: Internal Medicine

## 2014-09-26 ENCOUNTER — Encounter: Payer: Self-pay | Admitting: Internal Medicine

## 2014-09-26 ENCOUNTER — Ambulatory Visit (INDEPENDENT_AMBULATORY_CARE_PROVIDER_SITE_OTHER): Payer: Medicare Other | Admitting: Internal Medicine

## 2014-09-26 VITALS — BP 140/70 | HR 87 | Temp 98.1°F | Wt 282.0 lb

## 2014-09-26 DIAGNOSIS — L03113 Cellulitis of right upper limb: Secondary | ICD-10-CM

## 2014-09-26 MED ORDER — CEPHALEXIN 500 MG PO CAPS: 500.0000 mg | ORAL_CAPSULE | Freq: Four times a day (QID) | ORAL | Status: DC

## 2014-09-26 NOTE — Progress Notes (Signed)
Pre visit review using our clinic review tool, if applicable. No additional management support is needed unless otherwise documented below in the visit note. 

## 2014-09-26 NOTE — Assessment & Plan Note (Addendum)
Seems like classic bacterial cellulitis Low risk for MRSA Will use cephalexin---change if doesn't respond If worsens over weekend, would change to clindamycin 300mg  tid for a week

## 2014-09-26 NOTE — Progress Notes (Signed)
Subjective:    Patient ID: Chelsea Keller, female    DOB: 08/15/1934, 80 y.o.   MRN: [id number]  HPI Reviewed visits to cardiologist and hematology Has skipped furosemide for the last few days  Knows that this is why her weight is up 10# Trouble when she is going to be out for a while Breathing is still okay She will be able to take it more regularly in the next week  Has a concerning red area on lower right arm Noticed it first around 5 days ago Awoke with redness without trauma Warm and tender Red spots that look like outbreak of her psoriasis at top of red area  Current Outpatient Prescriptions on File Prior to Visit  Medication Sig Dispense Refill  . acetaminophen (TYLENOL) 500 MG tablet Take 1,000 mg by mouth at bedtime as needed for mild pain or moderate pain.     . Ferrous Sulfate (IRON) 325 (65 FE) MG TABS Take 325 mg by mouth 3 (three) times daily. 90 each 0  . furosemide (LASIX) 40 MG tablet Take 1 tablet (40 mg total) by mouth daily. 30 tablet   . metoCLOPramide (REGLAN) 10 MG tablet Take 1 tablet (10 mg total) by mouth once. Take prior to your magnesium citrate prep 1 tablet 0  . ondansetron (ZOFRAN) 4 MG tablet Take 1 tablet (4 mg total) by mouth every 6 (six) hours as needed for nausea. 10 tablet 0  . pantoprazole (PROTONIX) 40 MG tablet Take 1 tablet (40 mg total) by mouth daily. 30 tablet 1  . potassium chloride SA (K-DUR,KLOR-CON) 20 MEQ tablet Take 2 tablets (40 mEq total) by mouth 2 (two) times daily. 120 tablet 11  . spironolactone (ALDACTONE) 25 MG tablet Take 1 tablet (25 mg total) by mouth daily. 30 tablet 6  . temazepam (RESTORIL) 15 MG capsule Take 1-2 capsules (15-30 mg total) by mouth at bedtime as needed for sleep. 90 capsule 0  . traMADol (ULTRAM) 50 MG tablet Take 50 mg by mouth at bedtime as needed for moderate pain or severe pain.     . vitamin B-12 (CYANOCOBALAMIN) 1000 MCG tablet Take 1,000 mcg by mouth daily.     No current facility-administered  medications on file prior to visit.    Allergies  Allergen Reactions  . Codeine Sulfate Shortness Of Breath  . Celecoxib Other (See Comments)    Pt bled out  . Contrast Media [Iodinated Diagnostic Agents] Other (See Comments)    "Went numb, nausea and vomiting"  . Erythromycin Base Nausea And Vomiting    Past Medical History  Diagnosis Date  . Chronic venous insufficiency     chronic LE edema  . Hypertriglyceridemia   . Hypertension   . Arthritis   . Allergy   . Impaired fasting glucose   . Hx of colonic polyps   . PUD (peptic ulcer disease)     hx of gastric ulcer  . Vitamin B12 deficiency   . Fibromyalgia     constant pain  . Anxiety   . Depression   . Chronic diastolic CHF (congestive heart failure)     a. Echo 1/16:  mild LVH, EF normal, grade 1 DD, MAC  . Hx of cardiovascular stress test     a. Nuclear study in 2008 normal  . Hx of cardiac catheterization     a. LHC in [place] "ok" per patient with mild plaque in a single vessel - records not available  . Anemia  Past Surgical History  Procedure Laterality Date  . Cholecystectomy    . Tonsillectomy    . External fixation wrist fracture  1985    left with pins  . External fixation ankle fracture      Fx.  left ankle-fixation with pins later removed sec to infection 1985  . Cataract extraction  10/2003    OD  . Abdominal hysterectomy    . Hemiarthroplasty shoulder fracture  06/2009  . Femur fracture surgery  06/2009  . Total knee arthroplasty  03/09    left  . Tonsillectomy    . Fracture surgery    . Eye surgery    . Joint replacement    . Colonoscopy w/ polypectomy    . Hardware removal Left 10/05/2013    Procedure: HARDWARE REMOVAL LEFT DISTAL FEMUR;  Surgeon: Rozanna Box, MD;  Location: [facility];  Service: Orthopedics;  Laterality: Left;  . Steriod injection Right 10/05/2013    Procedure: STEROID INJECTION;  Surgeon: Rozanna Box, MD;  Location: [facility];  Service: Orthopedics;  Laterality: Right;     Family History  Problem Relation Age of Onset  . Depression Mother   . Cancer Mother     uterine cancer  . Cancer Brother     prostate cancer  . Diabetes Maternal Aunt   . Arthritis Brother   . Asthma Brother   . Stroke Maternal Grandmother   . Heart attack Father     History   Social History  . Marital Status: Widowed    Spouse Name: N/A  . Number of Children: 4  . 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 Smoking status: Former Smoker -- 1.00 packs/day for 40 years    Types: Cigarettes    Quit date: 07/06/1993  . Smokeless tobacco: Never Used  . Alcohol Use: No  . Drug Use: No  . Sexual Activity: Not on file   Other Topics Concern  . Not on file   Social History Narrative   No living will   Plans to do health care POA forms---wants daughter [name]   Would accept resuscitation attempts---no prolonged ventilation   Absolutely no feeding tube   Review of Systems No fever No nausea or vomiting    Objective:   Physical Exam  Constitutional: She appears well-developed and well-nourished. No distress.  Skin:  Irregular ~7 x 10cm red, warm and tender area on distal right arm from elbow toward medial arm Several excoriated superficially open areas there          Assessment & Plan:

## 2014-10-01 ENCOUNTER — Encounter (HOSPITAL_COMMUNITY): Payer: Self-pay | Admitting: *Deleted

## 2014-10-15 ENCOUNTER — Ambulatory Visit (HOSPITAL_COMMUNITY): Payer: Medicare Other | Admitting: Anesthesiology

## 2014-10-15 ENCOUNTER — Ambulatory Visit (HOSPITAL_COMMUNITY)
Admission: RE | Admit: 2014-10-15 | Discharge: 2014-10-15 | Disposition: A | Discharge status: Home / Self Care | Payer: Medicare Other | Source: Ambulatory Visit | Attending: Gastroenterology | Admitting: Gastroenterology

## 2014-10-15 ENCOUNTER — Encounter (HOSPITAL_COMMUNITY): Payer: Self-pay | Admitting: Anesthesiology

## 2014-10-15 ENCOUNTER — Encounter (HOSPITAL_COMMUNITY)
Admission: RE | Disposition: A | Discharge status: Home / Self Care | Payer: Self-pay | Source: Ambulatory Visit | Attending: Gastroenterology

## 2014-10-15 DIAGNOSIS — K648 Other hemorrhoids: Secondary | ICD-10-CM | POA: Diagnosis not present

## 2014-10-15 DIAGNOSIS — D125 Benign neoplasm of sigmoid colon: Secondary | ICD-10-CM

## 2014-10-15 DIAGNOSIS — Z8711 Personal history of peptic ulcer disease: Secondary | ICD-10-CM | POA: Diagnosis not present

## 2014-10-15 DIAGNOSIS — Z860100 Personal history of colon polyps, unspecified: Secondary | ICD-10-CM

## 2014-10-15 DIAGNOSIS — M1990 Unspecified osteoarthritis, unspecified site: Secondary | ICD-10-CM | POA: Diagnosis not present

## 2014-10-15 DIAGNOSIS — D128 Benign neoplasm of rectum: Secondary | ICD-10-CM

## 2014-10-15 DIAGNOSIS — I739 Peripheral vascular disease, unspecified: Secondary | ICD-10-CM | POA: Insufficient documentation

## 2014-10-15 DIAGNOSIS — I10 Essential (primary) hypertension: Secondary | ICD-10-CM | POA: Insufficient documentation

## 2014-10-15 DIAGNOSIS — K621 Rectal polyp: Secondary | ICD-10-CM | POA: Diagnosis not present

## 2014-10-15 DIAGNOSIS — D123 Benign neoplasm of transverse colon: Secondary | ICD-10-CM

## 2014-10-15 DIAGNOSIS — Z6841 Body Mass Index (BMI) 40.0 and over, adult: Secondary | ICD-10-CM | POA: Insufficient documentation

## 2014-10-15 DIAGNOSIS — K5730 Diverticulosis of large intestine without perforation or abscess without bleeding: Secondary | ICD-10-CM | POA: Diagnosis not present

## 2014-10-15 DIAGNOSIS — R195 Other fecal abnormalities: Secondary | ICD-10-CM | POA: Diagnosis not present

## 2014-10-15 DIAGNOSIS — D509 Iron deficiency anemia, unspecified: Secondary | ICD-10-CM | POA: Diagnosis not present

## 2014-10-15 DIAGNOSIS — I509 Heart failure, unspecified: Secondary | ICD-10-CM | POA: Diagnosis not present

## 2014-10-15 DIAGNOSIS — K5520 Angiodysplasia of colon without hemorrhage: Secondary | ICD-10-CM | POA: Diagnosis not present

## 2014-10-15 DIAGNOSIS — D124 Benign neoplasm of descending colon: Secondary | ICD-10-CM | POA: Insufficient documentation

## 2014-10-15 DIAGNOSIS — Z79899 Other long term (current) drug therapy: Secondary | ICD-10-CM | POA: Diagnosis not present

## 2014-10-15 DIAGNOSIS — M797 Fibromyalgia: Secondary | ICD-10-CM | POA: Diagnosis not present

## 2014-10-15 DIAGNOSIS — Z87891 Personal history of nicotine dependence: Secondary | ICD-10-CM | POA: Diagnosis not present

## 2014-10-15 DIAGNOSIS — K2960 Other gastritis without bleeding: Secondary | ICD-10-CM | POA: Diagnosis not present

## 2014-10-15 HISTORY — PX: ESOPHAGOGASTRODUODENOSCOPY (EGD) WITH PROPOFOL: SHX5813

## 2014-10-15 HISTORY — PX: COLONOSCOPY WITH PROPOFOL: SHX5780

## 2014-10-15 LAB — BASIC METABOLIC PANEL
Anion gap: 10 (ref 5–15)
BUN: 10 mg/dL (ref 6–23)
CHLORIDE: 107 mmol/L (ref 96–112)
CO2: 24 mmol/L (ref 19–32)
Calcium: 9.2 mg/dL (ref 8.4–10.5)
Creatinine, Ser: 0.85 mg/dL (ref 0.50–1.10)
GFR calc Af Amer: 75 mL/min — ABNORMAL LOW (ref >90)
GFR calc non Af Amer: 64 mL/min — ABNORMAL LOW (ref >90)
Glucose, Bld: 107 mg/dL — ABNORMAL HIGH (ref 70–99)
POTASSIUM: 3.8 mmol/L (ref 3.5–5.1)
Sodium: 141 mmol/L (ref 135–145)

## 2014-10-15 SURGERY — COLONOSCOPY WITH PROPOFOL
Anesthesia: Monitor Anesthesia Care

## 2014-10-15 MED ORDER — LACTATED RINGERS IV SOLN
INTRAVENOUS | Status: DC | PRN
  Administered 2014-10-15 (×2): via INTRAVENOUS

## 2014-10-15 MED ORDER — BUTAMBEN-TETRACAINE-BENZOCAINE 2-2-14 % EX AERO
INHALATION_SPRAY | CUTANEOUS | Status: DC | PRN
Start: 2014-10-15 — End: 2014-10-15
  Administered 2014-10-15: 2 via TOPICAL

## 2014-10-15 MED ORDER — LACTATED RINGERS IV SOLN: INTRAVENOUS | Status: DC

## 2014-10-15 MED ORDER — PROPOFOL INFUSION 10 MG/ML OPTIME
INTRAVENOUS | Status: DC | PRN
  Administered 2014-10-15: 120 ug/kg/min via INTRAVENOUS

## 2014-10-15 MED ORDER — PROPOFOL 10 MG/ML IV BOLUS
INTRAVENOUS | Status: AC
  Filled 2014-10-15: qty 20

## 2014-10-15 MED ORDER — SODIUM CHLORIDE 0.9 % IV SOLN: INTRAVENOUS | Status: DC

## 2014-10-15 MED ORDER — LIDOCAINE HCL (CARDIAC) 20 MG/ML IV SOLN
INTRAVENOUS | Status: AC
  Filled 2014-10-15: qty 5

## 2014-10-15 MED ORDER — ONDANSETRON HCL 4 MG/2ML IJ SOLN: INTRAMUSCULAR | Status: DC | PRN

## 2014-10-15 SURGICAL SUPPLY — 25 items

## 2014-10-15 NOTE — Op Note (Signed)
[facility] [place], [address]   ENDOSCOPY PROCEDURE REPORT  PATIENT: Chelsea Keller, Chelsea Keller  MR#: [id number] BIRTHDATE: 05/27/1935 , 42  yrs. old GENDER: female ENDOSCOPIST: Ladene Artist, MD, [facility] REFERRED BY:  Viviana Simpler PROCEDURE DATE:  10/15/2014 PROCEDURE:  EGD w/ biopsy ASA CLASS:     Class III INDICATIONS:  iron deficiency anemia and heme positive stool. MEDICATIONS: Monitored anesthesia care and Residual sedation present  TOPICAL ANESTHETIC: DESCRIPTION OF PROCEDURE: After the risks benefits and alternatives of the procedure were thoroughly explained, informed consent was obtained.  The Pentax Gastroscope [id number] endoscope was introduced through the mouth and advanced to the second portion of the duodenum , Without limitations.  The instrument was slowly withdrawn as the mucosa was fully examined.    ESOPHAGUS: The mucosa of the esophagus appeared normal. STOMACH: Gastritis, mild nonerosive, was found in the gastric antrum.   The stomach otherwise appeared normal. DUODENUM: The duodenal mucosa showed no abnormalities in the bulb and 2nd part of the duodenum.  Cold forceps biopsies were taken in the bulb and second portion.  Retroflexed views revealed no abnormalities.     The scope was then withdrawn from the patient and the procedure completed.  COMPLICATIONS: There were no immediate complications.  ENDOSCOPIC IMPRESSION: 1.    Mild gastritis in the gastric antrum 2.   The EGD otherwise appeared normal  RECOMMENDATIONS: 1.  Await pathology results 2.  Anti-reflux regimen 3.  Continue PPI   eSigned:  Ladene Artist, MD, [facility] 10/15/2014 10:29 AM

## 2014-10-15 NOTE — H&P (Signed)
History of Present Illness:   Patient is a 80 year old female who I saw last month for microcytic anemia with an acute drop in hemoglobin in the absence of any overt bleeding. Patient gave a remote history of colon polyps ([place]). She has a remote history of gastric ulcers as well. Patient was quite symptomatic from her anemia when I saw her in clinic. I felt she would benefit from a blood transfusion followed by upper endoscopy and colonoscopy for evaluation of anemia. Following 2 units of blood her hemoglobin rose appropriately to 11. Patient feels stronger after blood transfusion. She is able to ambulate with a rolling walker now. Still no overt bleeding. She is now on iron and stools are dark.   Current Medications, Allergies, Past Medical History, Past Surgical History, Family History and Social History were reviewed in [employer] record. Physical Exam: General: Pleasant, obese, white, female in no acute distress Head: Normocephalic and atraumatic Eyes: sclerae anicteric, conjunctiva pink  Ears: Normal auditory acuity Lungs: Clear throughout to auscultation Heart: Regular rate and rhythm Abdomen: limited exam, patient in chair. Abdomen soft, non distended, non-tender. Normal bowel sounds Rectal: brown, heme positive stool Musculoskeletal: Symmetrical with no gross deformities  Extremities: No edema  Neurological: Alert oriented x 4, grossly nonfocal Psychological: Alert and cooperative. Normal mood and affect  Assessment and Recommendations:  48. 80 year old female with iron deficiency anemia, heme positive stool. She had an appropriate rise in hemoglobin following 2 units of blood. She is scheduled for EGD / colonoscopy. The risks, benefits, and alternatives to EGD and colonoscopy with possible biopsy and possible polypectomy were discussed with the patient and she consents to proceed.   Patient reports problems tolerating colon preps secondary to  nausea and vomiting. For bowel prep patient will be on clear liquids for 2 days prior to procedure. Her bowel prep will include Dulcolax tablets, Mg+ citrate evening before procedure. Will give Reglan prior to starting prep and she can take Zofran as needed. Stop iron one week prior to procedure.   Iron malabsorption unlikely but family member present gives a family history of iron malabsorption problems. Will check tTG / IgA  2. Multiple medical problems, morbid obesity

## 2014-10-15 NOTE — Op Note (Signed)
[facility] [place], [address]   COLONOSCOPY PROCEDURE REPORT  PATIENT: Chelsea Keller, Chelsea Keller  MR#: [id number] BIRTHDATE: 22-May-1935 , 85  yrs. old GENDER: female ENDOSCOPIST: Ladene Artist, MD, [facility] REFERRED VE:HMCNOB, Richard PROCEDURE DATE:  10/15/2014 PROCEDURE:   Colonoscopy, diagnostic and Colonoscopy with snare polypectomy First Screening Colonoscopy - Avg.  risk and is 50 yrs.  old or older - No.  Prior Negative Screening - Now for repeat screening. N/A  History of Adenoma - Now for follow-up colonoscopy & has been > or = to 3 yrs.  Yes hx of adenoma.  Has been 3 or more years since last colonoscopy. ASA CLASS:   Class III INDICATIONS:Unexplained iron deficiency anemia, Evaluation of unexplained GI bleeding, PH Colon Adenoma, heme-positive stool, and iron deficiency anemia. MEDICATIONS: Monitored anesthesia care DESCRIPTION OF PROCEDURE:   After the risks benefits and alternatives of the procedure were thoroughly explained, informed consent was obtained.  The digital rectal exam revealed no abnormalities of the rectum.   The Pentax Ped Colon [id number] endoscope was introduced through the anus and advanced to the cecum, which was identified by both the appendix and ileocecal valve. No adverse events experienced.   The quality of the prep was adequate after extensive rinsing and suctioning. The instrument was then slowly withdrawn as the colon was fully examined.    COLON FINDINGS: Four polyps, 2 pedunculated and 2 sessile, measuring 7-10 mm in size were found in the sigmoid colon.  Polypectomies were performed with a cold snare for the 2 sessile polyps and using snare cautery for the pedunculated.  The resection was complete, the polyp tissue was completely retrieved and sent to histology. Three sessile polyps measuring 6-7 mm in size were found in the descending colon, transverse colon, and rectum.  Polypectomies were performed with a cold  snare.  The resection was complete, the polyp tissue was completely retrieved and sent to histology. Three 5-6 mm angiodysplastic lesions were found in the transverse colon and ascending colon.   There was mild diverticulosis noted in the transverse colon and ascending colon.   There was moderate diverticulosis noted in the sigmoid colon and descending colon. The examination was otherwise normal.  Retroflexed views revealed internal Grade I hemorrhoids. The time to cecum = 2.7 Withdrawal time = 21.8   The scope was withdrawn and the procedure completed. COMPLICATIONS: There were no immediate complications.  ENDOSCOPIC IMPRESSION: 1.   Four polyps in the sigmoid colon; polypectomies performed with a cold snare and using snare cautery 2.   Three sessile polyps in the descending, transverse, and rectum; polypectomies performed with a cold snare 3.   Three angiodysplastic lesions in the transverse colon and ascending colon 4.   Mild diverticulosis in the transverse colon and ascending colon  5.   Moderate diverticulosis in the sigmoid colon and descending colon 6.   Grade l internal hemorrhoids  RECOMMENDATIONS: 1.  Await pathology results 2.  Hold Aspirin and all other NSAIDS for 2 weeks. 3.  High fiber diet with liberal fluid intake. 4.  Repeat Colonoscopy in 3 years with a more extensive bowel prep. 5.  Fe deficiency likely from AVMs which will lead to a chronic/recurrent Fe deficiency-requires close monitoring by her PCP  eSigned:  Ladene Artist, MD, [facility] 10/15/2014 10:21 AM      PATIENT NAME:  Chelsea Keller, Chelsea Keller MR#: [id number]

## 2014-10-15 NOTE — Discharge Instructions (Signed)
Colonoscopy, Care After °These instructions give you information on caring for yourself after your procedure. Your doctor may also give you more specific instructions. Call your doctor if you have any problems or questions after your procedure. °HOME CARE °· Do not drive for 24 hours. °· Do not sign important papers or use machinery for 24 hours. °· You may shower. °· You may go back to your usual activities, but go slower for the first 24 hours. °· Take rest breaks often during the first 24 hours. °· Walk around or use warm packs on your belly (abdomen) if you have belly cramping or gas. °· Drink enough fluids to keep your pee (urine) clear or pale yellow. °· Resume your normal diet. Avoid heavy or fried foods. °· Avoid drinking alcohol for 24 hours or as told by your doctor. °· Only take medicines as told by your doctor. °If a tissue sample (biopsy) was taken during the procedure:  °· Do not take aspirin or blood thinners for 7 days, or as told by your doctor. °· Do not drink alcohol for 7 days, or as told by your doctor. °· Eat soft foods for the first 24 hours. °GET HELP IF: °You still have a small amount of blood in your poop (stool) 2-3 days after the procedure. °GET HELP RIGHT AWAY IF: °· You have more than a small amount of blood in your poop. °· You see clumps of tissue (blood clots) in your poop. °· Your belly is puffy (swollen). °· You feel sick to your stomach (nauseous) or throw up (vomit). °· You have a fever. °· You have belly pain that gets worse and medicine does not help. °MAKE SURE YOU: °· Understand these instructions. °· Will watch your condition. °· Will get help right away if you are not doing well or get worse. °Document Released: 07/25/2010 Document Revised: 06/27/2013 Document Reviewed: 02/27/2013 °ExitCare® Patient Information ©2015 [employer]. This information is not intended to replace advice given to you by your health care provider. Make sure you discuss any questions you have with  your health care provider. ° °

## 2014-10-15 NOTE — Transfer of Care (Signed)
Immediate Anesthesia Transfer of Care Note  Patient: Chelsea Keller  Procedure(s) Performed: Procedure(s): COLONOSCOPY WITH PROPOFOL (N/A) ESOPHAGOGASTRODUODENOSCOPY (EGD) WITH PROPOFOL (N/A)  Patient Location: PACU  Anesthesia Type:MAC  Level of Consciousness: sedated  Airway & Oxygen Therapy: Patient Spontanous Breathing and Patient connected to nasal cannula oxygen  Post-op Assessment: Post -op Vital signs reviewed and stable  Post vital signs: Reviewed and stable  Last Vitals:  Filed Vitals:   10/15/14 0846  BP: 180/70  Pulse: 80  Temp: 37.1 C  Resp: 18    Complications: No apparent anesthesia complications

## 2014-10-15 NOTE — Anesthesia Preprocedure Evaluation (Signed)
Anesthesia Evaluation  Patient identified by MRN, date of birth, ID band Patient awake    Reviewed: Allergy & Precautions, H&P , NPO status , Patient's Chart, lab work & pertinent test results  History of Anesthesia Complications (+) PONV and history of anesthetic complications  Airway Mallampati: II  TM Distance: >3 FB Neck ROM: Full    Dental  (+) Dental Advisory Given, Edentulous Upper, Edentulous Lower   Pulmonary shortness of breath and with exertion, former smoker,  breath sounds clear to auscultation        Cardiovascular hypertension, Pt. on medications + Peripheral Vascular Disease and +CHF Rhythm:Regular Rate:Normal     Neuro/Psych negative psych ROS   GI/Hepatic negative GI ROS, Neg liver ROS, PUD,   Endo/Other  Morbid obesity  Renal/GU negative Renal ROS     Musculoskeletal  (+) Arthritis -, Fibromyalgia -  Abdominal (+) + obese,   Peds  Hematology  (+) anemia ,   Anesthesia Other Findings   Reproductive/Obstetrics                             Anesthesia Physical  Anesthesia Plan  ASA: III  Anesthesia Plan: MAC   Post-op Pain Management:    Induction: Intravenous  Airway Management Planned:   Additional Equipment:   Intra-op Plan:   Post-operative Plan:   Informed Consent: I have reviewed the patients History and Physical, chart, labs and discussed the procedure including the risks, benefits and alternatives for the proposed anesthesia with the patient or authorized representative who has indicated his/her understanding and acceptance.   Dental advisory given  Plan Discussed with: CRNA  Anesthesia Plan Comments:         Anesthesia Quick Evaluation

## 2014-10-15 NOTE — Anesthesia Postprocedure Evaluation (Signed)
Anesthesia Post Note  Patient: Chelsea Keller  Procedure(s) Performed: Procedure(s) (LRB): COLONOSCOPY WITH PROPOFOL (N/A) ESOPHAGOGASTRODUODENOSCOPY (EGD) WITH PROPOFOL (N/A)  Anesthesia type: MAC  Patient location: PACU  Post pain: Pain level controlled  Post assessment: Post-op Vital signs reviewed  Last Vitals: BP 136/63 mmHg  Pulse 80  Temp(Src) 36.4 C (Oral)  Resp 17  Ht 5' (1.524 m)  Wt 278 lb (126.1 kg)  BMI 54.29 kg/m2  SpO2 99%  Post vital signs: Reviewed  Level of consciousness: awake  Complications: No apparent anesthesia complications

## 2014-10-16 ENCOUNTER — Encounter: Payer: Self-pay | Admitting: Gastroenterology

## 2014-10-16 ENCOUNTER — Encounter (HOSPITAL_COMMUNITY): Payer: Self-pay | Admitting: Gastroenterology

## 2014-12-13 ENCOUNTER — Encounter: Payer: Self-pay | Admitting: Cardiovascular Disease

## 2014-12-13 ENCOUNTER — Ambulatory Visit (INDEPENDENT_AMBULATORY_CARE_PROVIDER_SITE_OTHER): Payer: Medicare Other | Admitting: Cardiovascular Disease

## 2014-12-13 VITALS — BP 102/60 | HR 87 | Ht 60.0 in | Wt 278.1 lb

## 2014-12-13 DIAGNOSIS — D500 Iron deficiency anemia secondary to blood loss (chronic): Secondary | ICD-10-CM

## 2014-12-13 DIAGNOSIS — I5032 Chronic diastolic (congestive) heart failure: Secondary | ICD-10-CM

## 2014-12-13 DIAGNOSIS — I10 Essential (primary) hypertension: Secondary | ICD-10-CM | POA: Diagnosis not present

## 2014-12-13 LAB — CBC WITH DIFFERENTIAL/PLATELET
Basophils Absolute: 0 10*3/uL (ref 0.0–0.1)
Basophils Relative: 0.6 % (ref 0.0–3.0)
Eosinophils Absolute: 1.2 10*3/uL — ABNORMAL HIGH (ref 0.0–0.7)
Eosinophils Relative: 13.2 % — ABNORMAL HIGH (ref 0.0–5.0)
HCT: 36 % (ref 36.0–46.0)
Hemoglobin: 11.9 g/dL — ABNORMAL LOW (ref 12.0–15.0)
Lymphocytes Relative: 24.2 % (ref 12.0–46.0)
Lymphs Abs: 2.1 10*3/uL (ref 0.7–4.0)
MCHC: 32.9 g/dL (ref 30.0–36.0)
MCV: 86.6 fl (ref 78.0–100.0)
MONO ABS: 0.7 10*3/uL (ref 0.1–1.0)
Monocytes Relative: 7.6 % (ref 3.0–12.0)
Neutro Abs: 4.8 10*3/uL (ref 1.4–7.7)
Neutrophils Relative %: 54.4 % (ref 43.0–77.0)
Platelets: 297 10*3/uL (ref 150.0–400.0)
RBC: 4.16 Mil/uL (ref 3.87–5.11)
RDW: 14.8 % (ref 11.5–15.5)
WBC: 8.8 10*3/uL (ref 4.0–10.5)

## 2014-12-13 LAB — BASIC METABOLIC PANEL
BUN: 12 mg/dL (ref 6–23)
CHLORIDE: 107 meq/L (ref 96–112)
CO2: 28 mEq/L (ref 19–32)
Calcium: 9.1 mg/dL (ref 8.4–10.5)
Creatinine, Ser: 0.85 mg/dL (ref 0.40–1.20)
GFR: 68.73 mL/min (ref >60.00)
GLUCOSE: 106 mg/dL — AB (ref 70–99)
POTASSIUM: 3.8 meq/L (ref 3.5–5.1)
Sodium: 140 mEq/L (ref 135–145)

## 2014-12-13 MED ORDER — FUROSEMIDE 40 MG PO TABS: 40.0000 mg | ORAL_TABLET | Freq: Every day | ORAL | Status: DC

## 2014-12-13 NOTE — Patient Instructions (Signed)
Medication Instructions:  Your physician recommends that you continue on your current medications as directed. Please refer to the Current Medication list given to you today.   Labwork: Lab work to be done today--CBC, BMP.   Testing/Procedures: none  Follow-Up: Your physician wants you to follow-up in: 12 months.  You will receive a reminder letter in the mail two months in advance. If you don't receive a letter, please call our office to schedule the follow-up appointment.

## 2014-12-13 NOTE — Progress Notes (Signed)
0  Chief Complaint  Patient presents with  . Fatigue    History of Present Illness: 80 yo WF with history of obesity, HTN and chronic lower extremity edema here today for cardiac follow up. She has a remote history of tobacco abuse. She has had a normal myoview stress test in April of 2008. Her echo in May 2010 showed normal LV function and no significant valvular issues. I saw her in January 2016 and she c/o weakness, dizziness, dyspnea.  She was seen in primary care but chest x-ray was ok. She has chronic lower extremity edema and had been off of her Lasix. She endorsed a cough productive of clear sputum. She was admitted to [facility] for diuresis and aldactone was added. Echo January 2016 with normal LV systolic function. CTA chest negative for PE (elevated d-dimer). She was found to be anemic with Hgb of 8.7 (microcytic hypochromic). She was referred to GI and underwent EGD and colonoscopy 10/15/14. This showed gastritis, AVMs in the colon, polyps and divertculosis, hemorrhoids. Last seen in our office march 2016 by Richardson Dopp, PA-C. Her dyspnea was felt to be secondary to her obesity, deconditioning and anemia.   She is here today for follow up. She has not been taking her Lasix or aldactone.   Primary Care Physician: Silvio Pate  Last Lipid Profile:Lipid Panel     Component Value Date/Time   CHOL 166 09/04/2013 1134   TRIG 145.0 09/04/2013 1134   HDL 46.10 09/04/2013 1134   CHOLHDL 4 09/04/2013 1134   VLDL 29.0 09/04/2013 1134   [place] 91 09/04/2013 1134    Past Medical History  Diagnosis Date  . Chronic venous insufficiency     chronic LE edema  . Hypertriglyceridemia   . Hypertension   . Arthritis   . Allergy   . Impaired fasting glucose   . Hx of colonic polyps   . PUD (peptic ulcer disease)     hx of gastric ulcer  . Vitamin B12 deficiency   . Fibromyalgia     constant pain  . Anxiety   . Depression   . Chronic diastolic CHF (congestive heart failure)     a. Echo 1/16:   mild LVH, EF normal, grade 1 DD, MAC  . Hx of cardiovascular stress test     a. Nuclear study in 2008 normal  . Hx of cardiac catheterization     a. LHC in [place] "ok" per patient with mild plaque in a single vessel - records not available  . Anemia   . PONV (postoperative nausea and vomiting)     Past Surgical History  Procedure Laterality Date  . Cholecystectomy    . Tonsillectomy    . External fixation wrist fracture  1985    left with pins  . External fixation ankle fracture      Fx.  left ankle-fixation with pins later removed sec to infection 1985  . Cataract extraction  10/2003    OD  . Abdominal hysterectomy    . Hemiarthroplasty shoulder fracture  06/2009  . Femur fracture surgery  06/2009  . Total knee arthroplasty  03/09    left  . Tonsillectomy    . Fracture surgery    . Eye surgery    . Joint replacement    . Colonoscopy w/ polypectomy    . Hardware removal Left 10/05/2013    Procedure: HARDWARE REMOVAL LEFT DISTAL FEMUR;  Surgeon: Rozanna Box, MD;  Location: [place];  Service: Orthopedics;  Laterality: Left;  .  Steriod injection Right 10/05/2013    Procedure: STEROID INJECTION;  Surgeon: Rozanna Box, MD;  Location: [place];  Service: Orthopedics;  Laterality: Right;  . Colonoscopy with propofol N/A 10/15/2014    Procedure: COLONOSCOPY WITH PROPOFOL;  Surgeon: Ladene Artist, MD;  [facility];  Service: Endoscopy;  Laterality: N/A;  . Esophagogastroduodenoscopy (egd) with propofol N/A 10/15/2014    Procedure: ESOPHAGOGASTRODUODENOSCOPY (EGD) WITH PROPOFOL;  Surgeon: Ladene Artist, MD;  [facility];  Service: Endoscopy;  Laterality: N/A;    Current Outpatient Prescriptions  Medication Sig Dispense Refill  . acetaminophen (TYLENOL) 500 MG tablet Take 1,000 mg by mouth at bedtime as needed for mild pain or moderate pain.     . Ferrous Sulfate (IRON) 325 (65 FE) MG TABS Take 325 mg by mouth 3 (three) times daily. 90 each 0  . furosemide (LASIX)  40 MG tablet Take 1 tablet (40 mg total) by mouth daily. 30 tablet   . potassium chloride SA (K-DUR,KLOR-CON) 20 MEQ tablet Take 2 tablets (40 mEq total) by mouth 2 (two) times daily. (Patient taking differently: Take 20 mEq by mouth 3 (three) times daily. ) 120 tablet 11  . temazepam (RESTORIL) 15 MG capsule Take 1-2 capsules (15-30 mg total) by mouth at bedtime as needed for sleep. 90 capsule 0  . vitamin B-12 (CYANOCOBALAMIN) 1000 MCG tablet Take 1,000 mcg by mouth daily.     No current facility-administered medications for this visit.    Allergies  Allergen Reactions  . Codeine Sulfate Shortness Of Breath  . Celecoxib Other (See Comments)    Pt bled out  . Contrast Media [Iodinated Diagnostic Agents] Other (See Comments)    "Went numb, nausea and vomiting"  . Erythromycin Base Nausea And Vomiting    History   Social History  . Marital Status: Widowed    Spouse Name: N/A  . Number of Children: 4  . 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 Smoking status: Former Smoker -- 1.00 packs/day for 40 years    Types: Cigarettes    Quit date: 07/06/1993  . Smokeless tobacco: Never Used  . Alcohol Use: No  . Drug Use: No  . Sexual Activity: Not on file   Other Topics Concern  . Not on file   Social History Narrative   No living will   Plans to do health care POA forms---wants daughter [name]   Would accept resuscitation attempts---no prolonged ventilation   Absolutely no feeding tube    Family History  Problem Relation Age of Onset  . Depression Mother   . Cancer Mother     uterine cancer  . Cancer Brother     prostate cancer  . Diabetes Maternal Aunt   . Arthritis Brother   . Asthma Brother   . Stroke Maternal Grandmother   . Heart attack Father     Review of Systems:  As stated in the HPI and otherwise negative.   BP 102/60 mmHg  Pulse 87  Ht 5' (1.524 m)   Wt 278 lb 1.9 oz (126.154 kg)  BMI 54.32 kg/m2  Physical Examination: General: Well developed, well nourished, NAD HEENT: OP clear, mucus membranes moist SKIN: warm, dry. No rashes. Neuro: No focal deficits Musculoskeletal: Muscle strength 5/5 all ext Psychiatric: Mood and affect normal Neck: No JVD, no carotid bruits, no thyromegaly, no lymphadenopathy.  Lungs:Clear bilaterally, no wheezes, rhonci, crackles Cardiovascular: Regular rate and rhythm. No murmurs, gallops or rubs. Abdomen:Soft. Bowel sounds present. Non-tender.  Extremities: 1+ bilateral lower extremity edema. Pulses are hard to palpate bilaterally secondary to edema.   Echo January 2016:  Left ventricle: The cavity size was normal. There was mild concentric hypertrophy. Systolic function was normal. Wall motion was normal; there were no regional wall motion abnormalities. Doppler parameters are consistent with abnormal left ventricular relaxation (grade 1 diastolic dysfunction). - Mitral valve: Calcified annulus.  EKG:  EKG is not ordered today. The ekg ordered today demonstrates   Recent Labs: 08/03/2014: ALT 14; B Natriuretic Peptide 42.5; TSH 1.544 08/04/2014: Magnesium 1.8 08/14/2014: Pro B Natriuretic peptide (BNP) 58.0 09/04/2014: Hemoglobin 11.0*; Platelets 292.0 10/15/2014: BUN 10; Creatinine, Ser 0.85; Potassium 3.8; Sodium 141   Lipid Panel    Component Value Date/Time   CHOL 166 09/04/2013 1134   TRIG 145.0 09/04/2013 1134   HDL 46.10 09/04/2013 1134   CHOLHDL 4 09/04/2013 1134   VLDL 29.0 09/04/2013 1134   LDLCALC 91 09/04/2013 1134   LDLDIRECT 86.2 05/25/2008 1115     Wt Readings from Last 3 Encounters:  12/13/14 278 lb 1.9 oz (126.154 kg)  10/15/14 278 lb (126.1 kg)  09/26/14 282 lb (127.914 kg)     Other studies Reviewed: Additional studies/ records that were reviewed today include:  Review of the above records demonstrates:    Assessment and Plan:   1. Chronic diastolic CHF:.  Echo January 2016 with normal LV function.   Will continue Lasix for now. Check BMET today.   2. HTN: BP is controlled.    3. Anemia: Managed in primary care. Will check CBC today.   Current medicines are reviewed at length with the patient today.  The patient does not have concerns regarding medicines.  The following changes have been made:  no change  Labs/ tests ordered today include:  No orders of the defined types were placed in this encounter.     Disposition:   FU with me in 12  months   Signed, Lauree Chandler, MD 12/13/2014 11:04 AM    [facility] [place], [place], [place]  [address] Phone: [phone]; Fax: [phone]

## 2014-12-20 ENCOUNTER — Encounter: Payer: Self-pay | Admitting: Internal Medicine

## 2014-12-20 ENCOUNTER — Ambulatory Visit (INDEPENDENT_AMBULATORY_CARE_PROVIDER_SITE_OTHER): Payer: Medicare Other | Admitting: Internal Medicine

## 2014-12-20 VITALS — BP 138/60 | HR 93 | Temp 98.2°F | Ht 60.0 in | Wt 278.0 lb

## 2014-12-20 DIAGNOSIS — G479 Sleep disorder, unspecified: Secondary | ICD-10-CM

## 2014-12-20 DIAGNOSIS — Z6841 Body Mass Index (BMI) 40.0 and over, adult: Secondary | ICD-10-CM

## 2014-12-20 DIAGNOSIS — I5032 Chronic diastolic (congestive) heart failure: Secondary | ICD-10-CM

## 2014-12-20 DIAGNOSIS — D509 Iron deficiency anemia, unspecified: Secondary | ICD-10-CM

## 2014-12-20 DIAGNOSIS — F325 Major depressive disorder, single episode, in full remission: Secondary | ICD-10-CM

## 2014-12-20 DIAGNOSIS — Z0000 Encounter for general adult medical examination without abnormal findings: Secondary | ICD-10-CM

## 2014-12-20 MED ORDER — POTASSIUM CHLORIDE CRYS ER 20 MEQ PO TBCR: 40.0000 meq | EXTENDED_RELEASE_TABLET | Freq: Every day | ORAL | Status: DC

## 2014-12-20 MED ORDER — TRIAMCINOLONE ACETONIDE 0.1 % EX CREA: 1.0000 "application " | TOPICAL_CREAM | Freq: Two times a day (BID) | CUTANEOUS | Status: DC | PRN

## 2014-12-20 MED ORDER — TEMAZEPAM 15 MG PO CAPS: 15.0000 mg | ORAL_CAPSULE | Freq: Every evening | ORAL | Status: DC | PRN

## 2014-12-20 MED ORDER — TRAMADOL HCL 50 MG PO TABS: 50.0000 mg | ORAL_TABLET | Freq: Every evening | ORAL | Status: DC | PRN

## 2014-12-20 NOTE — Assessment & Plan Note (Signed)
Asked her not to use the benedryl

## 2014-12-20 NOTE — Assessment & Plan Note (Signed)
Really plans to work on lifestyle

## 2014-12-20 NOTE — Assessment & Plan Note (Signed)
Hemoglobin almost normal Off iron now Stopped the protonix also

## 2014-12-20 NOTE — Assessment & Plan Note (Signed)
Seems to be in remission now No Rx Never went to psychologist

## 2014-12-20 NOTE — Assessment & Plan Note (Signed)
I have personally reviewed the Medicare Annual Wellness questionnaire and have noted 1. The patient's medical and social history 2. Their use of alcohol, tobacco or illicit drugs 3. Their current medications and supplements 4. The patient's functional ability including ADL's, fall risks, home safety risks and hearing or visual             impairment. 5. Diet and physical activities 6. Evidence for depression or mood disorders  The patients weight, height, BMI and visual acuity have been recorded in the chart I have made referrals, counseling and provided education to the patient based review of the above and I have provided the pt with a written personalized care plan for preventive services.  I have provided you with a copy of your personalized plan for preventive services. Please take the time to review along with your updated medication list.  She does not want any immunizations or mammograms Discussed fitness, etc

## 2014-12-20 NOTE — Progress Notes (Signed)
Subjective:    Patient ID: Chelsea Keller, female    DOB: 19-Jun-1935, 80 y.o.   MRN: [id number]  HPI Here for Medicare wellness and follow up of chronic medical conditions Reviewed her form and advanced directives Only sees Dr Angelena Form No tobacco or alcohol Tries to walk but not very active No falls Independent with instrumental ADLs Vision okay--thinks she needs new glasses. Hearing is very good No apparent cognitive problems Did have the hospitalization earlier this year---reviewed that. Hgb is back near normal again  Breathing is about the same Some DOE--- relates to her weight Only taking furosemide once in a while--actually none in past 3 months Weighs herself regularly--fairly stable Sleeps on 3 pillows---no PND though Swelling is stable---fairly good in AM No chest pain No palpitations  Still gets down at times Looked into seeing psychologist--but didn't think her insurance would cover it well No daily symptoms Not anhedonic Granddaughter got married in [place]---very nice!  Sleeping better but not great Hasn't needed the temazepam in many months Does use tylenol PM instead-- will go back to the temazepam   Lots of knee pain Ankles hurt also Ran out of tramadol--hasn't used in a while but wants a refill  Trying to improve diet Weight is stable  Current Outpatient Prescriptions on File Prior to Visit  Medication Sig Dispense Refill  . acetaminophen (TYLENOL) 500 MG tablet Take 1,000 mg by mouth at bedtime as needed for mild pain or moderate pain.     . Ferrous Sulfate (IRON) 325 (65 FE) MG TABS Take 325 mg by mouth 3 (three) times daily. 90 each 0  . furosemide (LASIX) 40 MG tablet Take 1 tablet (40 mg total) by mouth daily. 90 tablet 3  . temazepam (RESTORIL) 15 MG capsule Take 1-2 capsules (15-30 mg total) by mouth at bedtime as needed for sleep. 90 capsule 0  . vitamin B-12 (CYANOCOBALAMIN) 1000 MCG tablet Take 1,000 mcg by mouth daily.     No current  facility-administered medications on file prior to visit.    Allergies  Allergen Reactions  . Codeine Sulfate Shortness Of Breath  . Celecoxib Other (See Comments)    Pt bled out  . Contrast Media [Iodinated Diagnostic Agents] Other (See Comments)    "Went numb, nausea and vomiting"  . Erythromycin Base Nausea And Vomiting    Past Medical History  Diagnosis Date  . Chronic venous insufficiency     chronic LE edema  . Hypertriglyceridemia   . Hypertension   . Arthritis   . Allergy   . Impaired fasting glucose   . Hx of colonic polyps   . PUD (peptic ulcer disease)     hx of gastric ulcer  . Vitamin B12 deficiency   . Fibromyalgia     constant pain  . Anxiety   . Depression   . Chronic diastolic CHF (congestive heart failure)     a. Echo 1/16:  mild LVH, EF normal, grade 1 DD, MAC  . Hx of cardiovascular stress test     a. Nuclear study in 2008 normal  . Hx of cardiac catheterization     a. LHC in [place] "ok" per patient with mild plaque in a single vessel - records not available  . Anemia   . PONV (postoperative nausea and vomiting)     Past Surgical History  Procedure Laterality Date  . Cholecystectomy    . Tonsillectomy    . External fixation wrist fracture  1985    left with  pins  . External fixation ankle fracture      Fx.  left ankle-fixation with pins later removed sec to infection 1985  . Cataract extraction  10/2003    OD  . Abdominal hysterectomy    . Hemiarthroplasty shoulder fracture  06/2009  . Femur fracture surgery  06/2009  . Total knee arthroplasty  03/09    left  . Tonsillectomy    . Fracture surgery    . Eye surgery    . Joint replacement    . Colonoscopy w/ polypectomy    . Hardware removal Left 10/05/2013    Procedure: HARDWARE REMOVAL LEFT DISTAL FEMUR;  Surgeon: Rozanna Box, MD;  Location: [place];  Service: Orthopedics;  Laterality: Left;  . Steriod injection Right 10/05/2013    Procedure: STEROID INJECTION;  Surgeon: Rozanna Box, MD;   Location: [place];  Service: Orthopedics;  Laterality: Right;  . Colonoscopy with propofol N/A 10/15/2014    Procedure: COLONOSCOPY WITH PROPOFOL;  Surgeon: Ladene Artist, MD;  [facility];  Service: Endoscopy;  Laterality: N/A;  . Esophagogastroduodenoscopy (egd) with propofol N/A 10/15/2014    Procedure: ESOPHAGOGASTRODUODENOSCOPY (EGD) WITH PROPOFOL;  Surgeon: Ladene Artist, MD;  [facility];  Service: Endoscopy;  Laterality: N/A;    Family History  Problem Relation Age of Onset  . Depression Mother   . Cancer Mother     uterine cancer  . Cancer Brother     prostate cancer  . Diabetes Maternal Aunt   . Arthritis Brother   . Asthma Brother   . Stroke Maternal Grandmother   . Heart attack Father     History   Social History  . Marital Status: Widowed    Spouse Name: N/A  . Number of Children: 4  . 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 Smoking status: Former Smoker -- 1.00 packs/day for 40 years    Types: Cigarettes    Quit date: 07/06/1993  . Smokeless tobacco: Never Used  . Alcohol Use: No  . Drug Use: No  . Sexual Activity: Not on file   Other Topics Concern  . Not on file   Social History Narrative   No living will   Plans to do health care POA forms---wants daughter Chelsea Keller   Would accept resuscitation attempts---no prolonged ventilation   Absolutely no feeding tube   Review of Systems Full dentures Bowels are loose --since colonoscopy. Some urgency but only going bid Polyps found again Frequent nocturia--- not much of a problem in daytime Rash on left arm and legs--triamcinolone helps    Objective:   Physical Exam  Constitutional: She is oriented to person, place, and time. She appears well-developed. No distress.  HENT:  Mouth/Throat: Oropharynx is clear and moist. No oropharyngeal exudate.  Neck: Normal range of motion.  Neck supple. No thyromegaly present.  Cardiovascular: Normal rate, regular rhythm, normal heart sounds and intact distal pulses.  Exam reveals no gallop.   No murmur heard. Pulmonary/Chest: Effort normal and breath sounds normal. No respiratory distress. She has no wheezes. She has no rales.  Abdominal: Soft. There is no tenderness.  Musculoskeletal:  2+ tense edema with mild venous stasis changes  Lymphadenopathy:    She has no cervical adenopathy.  Neurological: She is alert and oriented to person, place, and time.  President-- "Elyn Peers, Maudie Flakes, [place]"  [id number] Recall 3/3  Skin:  Excoriated lesions on left arm>right arm>legs   Psychiatric: She has a normal mood and affect. Her behavior is normal.          Assessment & Plan:

## 2014-12-20 NOTE — Progress Notes (Signed)
Pre visit review using our clinic review tool, if applicable. No additional management support is needed unless otherwise documented below in the visit note. 

## 2014-12-20 NOTE — Assessment & Plan Note (Signed)
Compensated Hasn't been using the furosemide but urged her to use it if weight goes up

## 2014-12-31 ENCOUNTER — Other Ambulatory Visit: Payer: Self-pay

## 2015-02-15 ENCOUNTER — Encounter: Payer: Self-pay | Admitting: Internal Medicine

## 2015-02-15 ENCOUNTER — Ambulatory Visit (INDEPENDENT_AMBULATORY_CARE_PROVIDER_SITE_OTHER): Payer: Medicare Other | Admitting: Internal Medicine

## 2015-02-15 VITALS — BP 158/80 | HR 99 | Temp 97.5°F | Wt 275.0 lb

## 2015-02-15 DIAGNOSIS — J0100 Acute maxillary sinusitis, unspecified: Secondary | ICD-10-CM | POA: Insufficient documentation

## 2015-02-15 MED ORDER — AMOXICILLIN 500 MG PO TABS: 1000.0000 mg | ORAL_TABLET | Freq: Two times a day (BID) | ORAL | Status: DC

## 2015-02-15 NOTE — Progress Notes (Signed)
Subjective:    Patient ID: Chelsea Keller, female    DOB: 1935-04-16, 80 y.o.   MRN: [id number]  HPI Here with daughter for respiratory symptoms  Started about a week ago with right sided sore throat At first she thought it was allergies--but then realized she was sick Grandson with similar symptoms Cough for 3-4 days--dry  Not really congested in head Runny in eyes Mild rhinorrhea but she has PND No ear pain No fever Does have some SOB--mostly exertional  Tylenol not especially helpful Took some left over amoxicillin---thought it may have helped  Current Outpatient Prescriptions on File Prior to Visit  Medication Sig Dispense Refill  . acetaminophen (TYLENOL) 500 MG tablet Take 1,000 mg by mouth at bedtime as needed for mild pain or moderate pain.     . Ferrous Sulfate (IRON) 325 (65 FE) MG TABS Take 325 mg by mouth 3 (three) times daily. 90 each 0  . furosemide (LASIX) 40 MG tablet Take 1 tablet (40 mg total) by mouth daily. 90 tablet 3  . potassium chloride SA (K-DUR,KLOR-CON) 20 MEQ tablet Take 2 tablets (40 mEq total) by mouth daily. 180 tablet 3  . temazepam (RESTORIL) 15 MG capsule Take 1-2 capsules (15-30 mg total) by mouth at bedtime as needed for sleep. 90 capsule 0  . traMADol (ULTRAM) 50 MG tablet Take 1 tablet (50 mg total) by mouth at bedtime as needed for moderate pain or severe pain. 90 tablet 0  . triamcinolone cream (KENALOG) 0.1 % Apply 1 application topically 2 (two) times daily as needed. 453.6 g 1  . vitamin B-12 (CYANOCOBALAMIN) 1000 MCG tablet Take 1,000 mcg by mouth daily.     No current facility-administered medications on file prior to visit.    Allergies  Allergen Reactions  . Codeine Sulfate Shortness Of Breath  . Celecoxib Other (See Comments)    Pt bled out  . Contrast Media [Iodinated Diagnostic Agents] Other (See Comments)    "Went numb, nausea and vomiting"  . Erythromycin Base Nausea And Vomiting    Past Medical History  Diagnosis  Date  . Chronic venous insufficiency     chronic LE edema  . Hypertriglyceridemia   . Hypertension   . Arthritis   . Allergy   . Impaired fasting glucose   . Hx of colonic polyps   . PUD (peptic ulcer disease)     hx of gastric ulcer  . Vitamin B12 deficiency   . Fibromyalgia     constant pain  . Anxiety   . Depression   . Chronic diastolic CHF (congestive heart failure)     a. Echo 1/16:  mild LVH, EF normal, grade 1 DD, MAC  . Hx of cardiovascular stress test     a. Nuclear study in 2008 normal  . Hx of cardiac catheterization     a. LHC in [place] "ok" per patient with mild plaque in a single vessel - records not available  . Anemia   . PONV (postoperative nausea and vomiting)     Past Surgical History  Procedure Laterality Date  . Cholecystectomy    . Tonsillectomy    . External fixation wrist fracture  1985    left with pins  . External fixation ankle fracture      Fx.  left ankle-fixation with pins later removed sec to infection 1985  . Cataract extraction  10/2003    OD  . Abdominal hysterectomy    . Hemiarthroplasty shoulder fracture  06/2009  .  Femur fracture surgery  06/2009  . Total knee arthroplasty  03/09    left  . Tonsillectomy    . Fracture surgery    . Eye surgery    . Joint replacement    . Colonoscopy w/ polypectomy    . Hardware removal Left 10/05/2013    Procedure: HARDWARE REMOVAL LEFT DISTAL FEMUR;  Surgeon: Rozanna Box, MD;  Location: [place];  Service: Orthopedics;  Laterality: Left;  . Steriod injection Right 10/05/2013    Procedure: STEROID INJECTION;  Surgeon: Rozanna Box, MD;  Location: [place];  Service: Orthopedics;  Laterality: Right;  . Colonoscopy with propofol N/A 10/15/2014    Procedure: COLONOSCOPY WITH PROPOFOL;  Surgeon: Ladene Artist, MD;  [facility];  Service: Endoscopy;  Laterality: N/A;  . Esophagogastroduodenoscopy (egd) with propofol N/A 10/15/2014    Procedure: ESOPHAGOGASTRODUODENOSCOPY (EGD) WITH PROPOFOL;   Surgeon: Ladene Artist, MD;  [facility];  Service: Endoscopy;  Laterality: N/A;    Family History  Problem Relation Age of Onset  . Depression Mother   . Cancer Mother     uterine cancer  . Cancer Brother     prostate cancer  . Diabetes Maternal Aunt   . Arthritis Brother   . Asthma Brother   . Stroke Maternal Grandmother   . Heart attack Father     Social History   Social History  . Marital Status: Widowed    Spouse Name: N/A  . Number of Children: 4  . 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 Smoking status: Former Smoker -- 1.00 packs/day for 40 years    Types: Cigarettes    Quit date: 07/06/1993  . Smokeless tobacco: Never Used  . Alcohol Use: No  . Drug Use: No  . Sexual Activity: Not on file   Other Topics Concern  . Not on file   Social History Narrative   No living will   Plans to do health care POA forms---wants daughter [name]   Would accept resuscitation attempts---no prolonged ventilation   Absolutely no feeding tube   Review of Systems Chronic rash on arms and legs No nausea or vomiting Appetite is okay Some bleeding from left nare now    Objective:   Physical Exam  Constitutional: She appears well-developed and well-nourished. No distress.  HENT:  Mild maxillary tenderness Mild pharyngeal injection without exudates Mild nasal inflammation TMs normal  Neck: Normal range of motion. Neck supple. No thyromegaly present.  Pulmonary/Chest: Effort normal and breath sounds normal. No respiratory distress. She has no wheezes. She has no rales.  Lymphadenopathy:    She has no cervical adenopathy.          Assessment & Plan:

## 2015-02-15 NOTE — Assessment & Plan Note (Signed)
Still seems like a viral infection Discussed symptomatic Rx Amoxil Rx for if she worsens

## 2015-02-15 NOTE — Progress Notes (Signed)
Pre visit review using our clinic review tool, if applicable. No additional management support is needed unless otherwise documented below in the visit note. 

## 2015-04-23 ENCOUNTER — Other Ambulatory Visit: Payer: Self-pay | Admitting: *Deleted

## 2015-04-23 NOTE — Telephone Encounter (Signed)
Last filled both prescriptions #90 12/20/14.  Patient requests printed prescriptions to mail to [facility].

## 2015-04-23 NOTE — Telephone Encounter (Signed)
Approved: okay to prepare #90 x 0 for both I will sign in the morning

## 2015-04-24 ENCOUNTER — Telehealth: Payer: Self-pay

## 2015-04-24 MED ORDER — TRAMADOL HCL 50 MG PO TABS: 50.0000 mg | ORAL_TABLET | Freq: Every evening | ORAL | Status: DC | PRN

## 2015-04-24 MED ORDER — TEMAZEPAM 15 MG PO CAPS: 15.0000 mg | ORAL_CAPSULE | Freq: Every evening | ORAL | Status: DC | PRN

## 2015-04-24 NOTE — Telephone Encounter (Signed)
Patient left voicemail requesting lab results. I attempted to contact patient to get more information, but was unable to reach. Will try again later.

## 2015-04-24 NOTE — Telephone Encounter (Signed)
Spoke with patient and advised rx ready for pick-up and it will be at the front desk.  

## 2015-04-26 ENCOUNTER — Telehealth: Payer: Self-pay | Admitting: Internal Medicine

## 2015-04-26 NOTE — Telephone Encounter (Signed)
Pt also asking if she can get her blood count checked? Pt states Dr. Angelena Form told her to come here and let Dr.Letvak check it. Pt states she has that " funny" feeling like she did before when she needed a transfusion

## 2015-04-26 NOTE — Telephone Encounter (Signed)
Pt dropped off a renewal for parking placard. Best number to contact pt is [phone]. Placing ppw in Dr. Alla German tower.

## 2015-04-27 NOTE — Telephone Encounter (Signed)
You can set her up a lab appt and do CBC if she wishes.

## 2015-04-29 ENCOUNTER — Other Ambulatory Visit: Payer: Self-pay | Admitting: Internal Medicine

## 2015-04-29 DIAGNOSIS — R7989 Other specified abnormal findings of blood chemistry: Secondary | ICD-10-CM

## 2015-04-29 NOTE — Telephone Encounter (Signed)
Spoke with patient and advised results  Lab appt scheduled  

## 2015-04-30 ENCOUNTER — Other Ambulatory Visit (INDEPENDENT_AMBULATORY_CARE_PROVIDER_SITE_OTHER): Payer: Medicare Other

## 2015-04-30 DIAGNOSIS — R7989 Other specified abnormal findings of blood chemistry: Secondary | ICD-10-CM

## 2015-04-30 LAB — CBC WITH DIFFERENTIAL/PLATELET
BASOS ABS: 0.1 10*3/uL (ref 0.0–0.1)
Basophils Relative: 0.7 % (ref 0.0–3.0)
Eosinophils Absolute: 0.7 10*3/uL (ref 0.0–0.7)
Eosinophils Relative: 7.2 % — ABNORMAL HIGH (ref 0.0–5.0)
HCT: 37.6 % (ref 36.0–46.0)
Hemoglobin: 12.1 g/dL (ref 12.0–15.0)
Lymphocytes Relative: 27.7 % (ref 12.0–46.0)
Lymphs Abs: 2.6 10*3/uL (ref 0.7–4.0)
MCHC: 32.3 g/dL (ref 30.0–36.0)
MCV: 84.6 fl (ref 78.0–100.0)
MONOS PCT: 9.4 % (ref 3.0–12.0)
Monocytes Absolute: 0.9 10*3/uL (ref 0.1–1.0)
NEUTROS ABS: 5.1 10*3/uL (ref 1.4–7.7)
Neutrophils Relative %: 55 % (ref 43.0–77.0)
Platelets: 299 10*3/uL (ref 150.0–400.0)
RBC: 4.44 Mil/uL (ref 3.87–5.11)
RDW: 16.4 % — ABNORMAL HIGH (ref 11.5–15.5)
WBC: 9.3 10*3/uL (ref 4.0–10.5)

## 2015-08-01 ENCOUNTER — Encounter: Payer: Self-pay | Admitting: Internal Medicine

## 2015-08-01 ENCOUNTER — Ambulatory Visit (INDEPENDENT_AMBULATORY_CARE_PROVIDER_SITE_OTHER): Payer: Medicare Other | Admitting: Internal Medicine

## 2015-08-01 VITALS — BP 150/80 | HR 80 | Temp 98.0°F | Wt 282.0 lb

## 2015-08-01 DIAGNOSIS — M542 Cervicalgia: Secondary | ICD-10-CM | POA: Diagnosis not present

## 2015-08-01 MED ORDER — TRAMADOL HCL 50 MG PO TABS: 50.0000 mg | ORAL_TABLET | Freq: Every evening | ORAL | Status: DC | PRN

## 2015-08-01 MED ORDER — TIZANIDINE HCL 2 MG PO TABS: 2.0000 mg | ORAL_TABLET | Freq: Three times a day (TID) | ORAL | Status: DC | PRN

## 2015-08-01 MED ORDER — TEMAZEPAM 15 MG PO CAPS: 15.0000 mg | ORAL_CAPSULE | Freq: Every evening | ORAL | Status: DC | PRN

## 2015-08-01 NOTE — Progress Notes (Signed)
Subjective:    Patient ID: Chelsea Keller, female    DOB: 03/02/1935, 80 y.o.   MRN: [id number]  HPI Here with daughter due to pain in her head Day 3 now--- "and it is bad" Feels it inside of both ears but around the head also Occiput also Really seems it is the entire head  Excruciating pain--"like I can't focus" Came out of nowhere --awoke with it Tuesday (1/24) morning Still doesn't sleep well--up every 1-2 hours due to urinary frequency Had been in bed Has "[employer]" for several months-- likes it  Hard to move her head at all Stiff in neck  Taken tramadol--doubled up to 100mg  bid. Takes the edge off briefly Hasn't tried heat or other physical modalities  Current Outpatient Prescriptions on File Prior to Visit  Medication Sig Dispense Refill  . acetaminophen (TYLENOL) 500 MG tablet Take 1,000 mg by mouth at bedtime as needed for mild pain or moderate pain.     . furosemide (LASIX) 40 MG tablet Take 1 tablet (40 mg total) by mouth daily. 90 tablet 3  . potassium chloride SA (K-DUR,KLOR-CON) 20 MEQ tablet Take 2 tablets (40 mEq total) by mouth daily. 180 tablet 3  . temazepam (RESTORIL) 15 MG capsule Take 1-2 capsules (15-30 mg total) by mouth at bedtime as needed for sleep. 90 capsule 0  . traMADol (ULTRAM) 50 MG tablet Take 1 tablet (50 mg total) by mouth at bedtime as needed for moderate pain or severe pain. 90 tablet 0  . triamcinolone cream (KENALOG) 0.1 % Apply 1 application topically 2 (two) times daily as needed. 453.6 g 1  . vitamin B-12 (CYANOCOBALAMIN) 1000 MCG tablet Take 1,000 mcg by mouth daily.     No current facility-administered medications on file prior to visit.    Allergies  Allergen Reactions  . Codeine Sulfate Shortness Of Breath  . Celecoxib Other (See Comments)    Pt bled out  . Contrast Media [Iodinated Diagnostic Agents] Other (See Comments)    "Went numb, nausea and vomiting"  . Erythromycin Base Nausea And Vomiting    Past Medical History    Diagnosis Date  . Chronic venous insufficiency     chronic LE edema  . Hypertriglyceridemia   . Hypertension   . Arthritis   . Allergy   . Impaired fasting glucose   . Hx of colonic polyps   . PUD (peptic ulcer disease)     hx of gastric ulcer  . Vitamin B12 deficiency   . Fibromyalgia     constant pain  . Anxiety   . Depression   . Chronic diastolic CHF (congestive heart failure) (HCC)     a. Echo 1/16:  mild LVH, EF normal, grade 1 DD, MAC  . Hx of cardiovascular stress test     a. Nuclear study in 2008 normal  . Hx of cardiac catheterization     a. LHC in [place] "ok" per patient with mild plaque in a single vessel - records not available  . Anemia   . PONV (postoperative nausea and vomiting)     Past Surgical History  Procedure Laterality Date  . Cholecystectomy    . Tonsillectomy    . External fixation wrist fracture  1985    left with pins  . External fixation ankle fracture      Fx.  left ankle-fixation with pins later removed sec to infection 1985  . Cataract extraction  10/2003    OD  . Abdominal hysterectomy    .  Hemiarthroplasty shoulder fracture  06/2009  . Femur fracture surgery  06/2009  . Total knee arthroplasty  03/09    left  . Tonsillectomy    . Fracture surgery    . Eye surgery    . Joint replacement    . Colonoscopy w/ polypectomy    . Hardware removal Left 10/05/2013    Procedure: HARDWARE REMOVAL LEFT DISTAL FEMUR;  Surgeon: Rozanna Box, MD;  Location: [place];  Service: Orthopedics;  Laterality: Left;  . Steriod injection Right 10/05/2013    Procedure: STEROID INJECTION;  Surgeon: Rozanna Box, MD;  Location: [place];  Service: Orthopedics;  Laterality: Right;  . Colonoscopy with propofol N/A 10/15/2014    Procedure: COLONOSCOPY WITH PROPOFOL;  Surgeon: Ladene Artist, MD;  Location: [facility];  Service: Endoscopy;  Laterality: N/A;  . Esophagogastroduodenoscopy (egd) with propofol N/A 10/15/2014    Procedure: ESOPHAGOGASTRODUODENOSCOPY (EGD)  WITH PROPOFOL;  Surgeon: Ladene Artist, MD;  Location: [facility];  Service: Endoscopy;  Laterality: N/A;    Family History  Problem Relation Age of Onset  . Depression Mother   . Cancer Mother     uterine cancer  . Cancer Brother     prostate cancer  . Diabetes Maternal Aunt   . Arthritis Brother   . Asthma Brother   . Stroke Maternal Grandmother   . Heart attack Father     Social History   Social History  . Marital Status: Widowed    Spouse Name: N/A  . Number of Children: 4  . 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 Smoking status: Former Smoker -- 1.00 packs/day for 40 years    Types: Cigarettes    Quit date: 07/06/1993  . Smokeless tobacco: Never Used  . Alcohol Use: No  . Drug Use: No  . Sexual Activity: Not on file   Other Topics Concern  . Not on file   Social History Narrative   No living will   Plans to do health care POA forms---wants daughter [name]   Would accept resuscitation attempts---no prolonged ventilation   Absolutely no feeding tube   Review of Systems No vision changes--no diplopia or unilateral vision loss Eating okay--doesn't seem to involve TMJ. Though some pain in jaw yesterday No fever    Objective:   Physical Exam  HENT:  No temporal or parietal tenderness  Eyes: Pupils are equal, round, and reactive to light.  Neck: No thyromegaly present.  Clear spasm around neck Tenderness in traps and anteriorly Marked restriction of motion in all spheres  Lymphadenopathy:    She has no cervical adenopathy.  Neurological:  No weakness in arms          Assessment & Plan:

## 2015-08-01 NOTE — Assessment & Plan Note (Signed)
Actually not really in her head---only neck Clearly seems to be spasm related Discussed heat, consider massage Will try limited muscle relaxers

## 2015-08-23 ENCOUNTER — Telehealth: Payer: Self-pay | Admitting: *Deleted

## 2015-08-23 NOTE — Telephone Encounter (Signed)
Spoke with patient and advised that temazepam was not approved, pt already knew and spoke with [facility] and calling some numbers to see why it can't be approved.per pt she will call back to let me know what happened.

## 2015-12-16 ENCOUNTER — Other Ambulatory Visit: Payer: Self-pay | Admitting: Internal Medicine

## 2015-12-23 ENCOUNTER — Encounter: Payer: Self-pay | Admitting: Internal Medicine

## 2015-12-23 ENCOUNTER — Ambulatory Visit (INDEPENDENT_AMBULATORY_CARE_PROVIDER_SITE_OTHER): Payer: Medicare Other | Admitting: Internal Medicine

## 2015-12-23 VITALS — BP 132/70 | HR 89 | Temp 98.1°F | Ht 60.0 in | Wt 283.0 lb

## 2015-12-23 DIAGNOSIS — I10 Essential (primary) hypertension: Secondary | ICD-10-CM | POA: Diagnosis not present

## 2015-12-23 DIAGNOSIS — Z0000 Encounter for general adult medical examination without abnormal findings: Secondary | ICD-10-CM | POA: Diagnosis not present

## 2015-12-23 DIAGNOSIS — I872 Venous insufficiency (chronic) (peripheral): Secondary | ICD-10-CM | POA: Diagnosis not present

## 2015-12-23 DIAGNOSIS — F39 Unspecified mood [affective] disorder: Secondary | ICD-10-CM

## 2015-12-23 DIAGNOSIS — Z6841 Body Mass Index (BMI) 40.0 and over, adult: Secondary | ICD-10-CM | POA: Diagnosis not present

## 2015-12-23 DIAGNOSIS — I5032 Chronic diastolic (congestive) heart failure: Secondary | ICD-10-CM

## 2015-12-23 LAB — COMPREHENSIVE METABOLIC PANEL
ALT: 12 U/L (ref 0–35)
AST: 17 U/L (ref 0–37)
Albumin: 3.7 g/dL (ref 3.5–5.2)
Alkaline Phosphatase: 70 U/L (ref 39–117)
BUN: 14 mg/dL (ref 6–23)
CO2: 24 mEq/L (ref 19–32)
Calcium: 9.1 mg/dL (ref 8.4–10.5)
Chloride: 106 mEq/L (ref 96–112)
Creatinine, Ser: 0.87 mg/dL (ref 0.40–1.20)
GFR: 66.73 mL/min (ref >60.00)
Glucose, Bld: 108 mg/dL — ABNORMAL HIGH (ref 70–99)
POTASSIUM: 4.1 meq/L (ref 3.5–5.1)
Sodium: 141 mEq/L (ref 135–145)
Total Bilirubin: 0.8 mg/dL (ref 0.2–1.2)
Total Protein: 6.5 g/dL (ref 6.0–8.3)

## 2015-12-23 LAB — CBC WITH DIFFERENTIAL/PLATELET
Basophils Absolute: 0 10*3/uL (ref 0.0–0.1)
Basophils Relative: 0.4 % (ref 0.0–3.0)
EOS ABS: 0.6 10*3/uL (ref 0.0–0.7)
EOS PCT: 5.4 % — AB (ref 0.0–5.0)
HEMATOCRIT: 35.7 % — AB (ref 36.0–46.0)
Hemoglobin: 11.5 g/dL — ABNORMAL LOW (ref 12.0–15.0)
Lymphocytes Relative: 19.7 % (ref 12.0–46.0)
Lymphs Abs: 2 10*3/uL (ref 0.7–4.0)
MCHC: 32.2 g/dL (ref 30.0–36.0)
MCV: 80.3 fl (ref 78.0–100.0)
Monocytes Absolute: 0.9 10*3/uL (ref 0.1–1.0)
Monocytes Relative: 9.1 % (ref 3.0–12.0)
NEUTROS PCT: 65.4 % (ref 43.0–77.0)
Neutro Abs: 6.8 10*3/uL (ref 1.4–7.7)
PLATELETS: 274 10*3/uL (ref 150.0–400.0)
RBC: 4.45 Mil/uL (ref 3.87–5.11)
RDW: 17.1 % — AB (ref 11.5–15.5)
WBC: 10.4 10*3/uL (ref 4.0–10.5)

## 2015-12-23 LAB — T4, FREE: Free T4: 0.68 ng/dL (ref 0.60–1.60)

## 2015-12-23 MED ORDER — TEMAZEPAM 15 MG PO CAPS: 15.0000 mg | ORAL_CAPSULE | Freq: Every evening | ORAL | Status: DC | PRN

## 2015-12-23 NOTE — Assessment & Plan Note (Signed)
I have personally reviewed the Medicare Annual Wellness questionnaire and have noted 1. The patient's medical and social history 2. Their use of alcohol, tobacco or illicit drugs 3. Their current medications and supplements 4. The patient's functional ability including ADL's, fall risks, home safety risks and hearing or visual             impairment. 5. Diet and physical activities 6. Evidence for depression or mood disorders  The patients weight, height, BMI and visual acuity have been recorded in the chart I have made referrals, counseling and provided education to the patient based review of the above and I have provided the pt with a written personalized care plan for preventive services.  I have provided you with a copy of your personalized plan for preventive services. Please take the time to review along with your updated medication list.  Prefers no vaccines No cancer screening due to age Unable to exercise--discussed working on healthy eating

## 2015-12-23 NOTE — Assessment & Plan Note (Signed)
Urged her to do what she can--though not able to exercise

## 2015-12-23 NOTE — Assessment & Plan Note (Signed)
No longer depressed Frustration with pain and disability No meds--but will refill temazepam for sleep if she requests (she will have to pay for it)

## 2015-12-23 NOTE — Assessment & Plan Note (Signed)
Weight/fluid stable without the furosemide Stable DOE--may be more related to weight

## 2015-12-23 NOTE — Assessment & Plan Note (Signed)
BP Readings from Last 3 Encounters:  12/23/15 132/70  08/01/15 150/80  02/15/15 158/80   Good control

## 2015-12-23 NOTE — Progress Notes (Signed)
Pre visit review using our clinic review tool, if applicable. No additional management support is needed unless otherwise documented below in the visit note. 

## 2015-12-23 NOTE — Assessment & Plan Note (Signed)
Stable edema Better in AM

## 2015-12-23 NOTE — Progress Notes (Signed)
Subjective:    Patient ID: Chelsea Keller, female    DOB: January 17, 1935, 81 y.o.   MRN: [id number]  HPI Here for Medicare wellness and follow up of chronic health conditions Reviewed form and advanced directives Reviewed other doctors--cardiology and ortho No tobacco or alcohol No exercise Hasn't fallen No major mood issues Independent with instrumental ADLs No apparent cognitive issues  Feels like she has a head cold Just visiting daughter in [place]---very cold in her home Started 2 days ago No fever Dry cough but no change in chronic dyspnea Hasn't been taking the furosemide--worsens the incontinence (and fluid status has been okay)  DOE is stable---only walking for a while Has steep flight of steps to walk in house--but only once a day No chest pain No palpitations Stable edema---better in AM Some dizziness--can occur even in bed (?vestibular)  Not sleeping well--mind keeps going. Uses left over temazepam sparingly Up every 90 minutes to use the bathroom Not really depressed now---"my kids keep me busy" Very limited but doesn't get worried about it  Trying to be careful with eating Not able to exercise Weight is up slightly though Up most of the night and then sleeps in day---affects metabolism   Current Outpatient Prescriptions on File Prior to Visit  Medication Sig Dispense Refill  . acetaminophen (TYLENOL) 500 MG tablet Take 1,000 mg by mouth at bedtime as needed for mild pain or moderate pain.     . potassium chloride SA (K-DUR,KLOR-CON) 20 MEQ tablet TAKE 2 TABLETS (40 MEQ TOTAL) DAILY 180 tablet 0  . traMADol (ULTRAM) 50 MG tablet Take 1 tablet (50 mg total) by mouth at bedtime as needed for moderate pain or severe pain. 90 tablet 0  . triamcinolone cream (KENALOG) 0.1 % Apply 1 application topically 2 (two) times daily as needed. 453.6 g 1  . vitamin B-12 (CYANOCOBALAMIN) 1000 MCG tablet Take 1,000 mcg by mouth daily.    . furosemide (LASIX) 40 MG tablet Take 1  tablet (40 mg total) by mouth daily. (Patient not taking: Reported on 12/23/2015) 90 tablet 3   No current facility-administered medications on file prior to visit.    Allergies  Allergen Reactions  . Codeine Sulfate Shortness Of Breath  . Celecoxib Other (See Comments)    Pt bled out  . Contrast Media [Iodinated Diagnostic Agents] Other (See Comments)    "Went numb, nausea and vomiting"  . Erythromycin Base Nausea And Vomiting    Past Medical History  Diagnosis Date  . Chronic venous insufficiency     chronic LE edema  . Hypertriglyceridemia   . Hypertension   . Arthritis   . Allergy   . Impaired fasting glucose   . Hx of colonic polyps   . PUD (peptic ulcer disease)     hx of gastric ulcer  . Vitamin B12 deficiency   . Fibromyalgia     constant pain  . Anxiety   . Depression   . Chronic diastolic CHF (congestive heart failure) (HCC)     a. Echo 1/16:  mild LVH, EF normal, grade 1 DD, MAC  . Hx of cardiovascular stress test     a. Nuclear study in 2008 normal  . Hx of cardiac catheterization     a. [facility] in [place] "ok" per patient with mild plaque in a single vessel - records not available  . Anemia   . PONV (postoperative nausea and vomiting)     Past Surgical History  Procedure Laterality Date  . Cholecystectomy    .  Tonsillectomy    . External fixation wrist fracture  1985    left with pins  . External fixation ankle fracture      Fx.  left ankle-fixation with pins later removed sec to infection 1985  . Cataract extraction  10/2003    OD  . Abdominal hysterectomy    . Hemiarthroplasty shoulder fracture  06/2009  . Femur fracture surgery  06/2009  . Total knee arthroplasty  03/09    left  . Tonsillectomy    . Fracture surgery    . Eye surgery    . Joint replacement    . Colonoscopy w/ polypectomy    . Hardware removal Left 10/05/2013    Procedure: HARDWARE REMOVAL LEFT DISTAL FEMUR;  Surgeon: Rozanna Box, MD;  Location: [place];  Service: Orthopedics;   Laterality: Left;  . Steriod injection Right 10/05/2013    Procedure: STEROID INJECTION;  Surgeon: Rozanna Box, MD;  Location: [place];  Service: Orthopedics;  Laterality: Right;  . Colonoscopy with propofol N/A 10/15/2014    Procedure: COLONOSCOPY WITH PROPOFOL;  Surgeon: Ladene Artist, MD;  [facility];  Service: Endoscopy;  Laterality: N/A;  . Esophagogastroduodenoscopy (egd) with propofol N/A 10/15/2014    Procedure: ESOPHAGOGASTRODUODENOSCOPY (EGD) WITH PROPOFOL;  Surgeon: Ladene Artist, MD;  [facility];  Service: Endoscopy;  Laterality: N/A;    Family History  Problem Relation Age of Onset  . Depression Mother   . Cancer Mother     uterine cancer  . Cancer Brother     prostate cancer  . Diabetes Maternal Aunt   . Arthritis Brother   . Asthma Brother   . Stroke Maternal Grandmother   . Heart attack Father     Social History   Social History  . Marital Status: Widowed    Spouse Name: N/A  . Number of Children: 4  . 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 Smoking status: Former Smoker -- 1.00 packs/day for 40 years    Types: Cigarettes    Quit date: 07/06/1993  . Smokeless tobacco: Never Used  . Alcohol Use: No  . Drug Use: No  . Sexual Activity: Not on file   Other Topics Concern  . Not on file   Social History Narrative   No living will   Plans to do health care POA forms---wants daughter [name]   Would accept resuscitation attempts---no prolonged ventilation   Absolutely no feeding tube   Review of Systems Chronic left ankle pain since injury 30 years ago. Didn't want fusion then--but will be going back to Dr Ginette Pitman to reevaluate this. Also with hip pain. Tylenol doesn't do much and uses tramadol (only "takes the edge of it") Wears seat belt Appetite is fine Full dentures Bowels are okay--no blood Chronic psoriatic rash----she  uses creams but it never goes away    Objective:   Physical Exam  Constitutional: She is oriented to person, place, and time. She appears well-developed. No distress.  HENT:  Mouth/Throat: Oropharynx is clear and moist. No oropharyngeal exudate.  Neck: Normal range of motion. Neck supple. No thyromegaly present.  Cardiovascular: Normal rate and normal heart sounds.   Faint pedal pulses  Pulmonary/Chest: Effort normal and breath sounds normal. No respiratory distress. She has no wheezes. She has no rales.  Abdominal: Soft. There is no tenderness.  Musculoskeletal:  She exhibits no tenderness.  2+ non pitting edema--up to knees  Lymphadenopathy:    She has no cervical adenopathy.  Neurological: She is alert and oriented to person, place, and time.  President-- "Daisy Floro, Obama, Bush" [phone] [id number] Recall 3/3  Skin: No erythema.  Scattered psoriatic rash  Psychiatric: She has a normal mood and affect. Her behavior is normal.          Assessment & Plan:

## 2015-12-30 ENCOUNTER — Other Ambulatory Visit: Payer: Self-pay | Admitting: Internal Medicine

## 2015-12-30 NOTE — Telephone Encounter (Signed)
I spoke to patient. She said she has not needed the medication since January. This morning, she woke up with bad right hip pain. She had 1 tizanidine left. She has an appointment with Dr Marcelino Scot on Wednesday. She was hoping another provider would approve a refill. She had her yearly exam on the 19th. Will you refill in Dr Alla German absence?

## 2015-12-30 NOTE — Telephone Encounter (Signed)
Sent. Thanks.   

## 2016-01-01 DIAGNOSIS — M19072 Primary osteoarthritis, left ankle and foot: Secondary | ICD-10-CM | POA: Diagnosis not present

## 2016-01-01 DIAGNOSIS — M1611 Unilateral primary osteoarthritis, right hip: Secondary | ICD-10-CM | POA: Diagnosis not present

## 2016-01-01 DIAGNOSIS — M25561 Pain in right knee: Secondary | ICD-10-CM | POA: Diagnosis not present

## 2016-01-09 ENCOUNTER — Ambulatory Visit
Admission: RE | Admit: 2016-01-09 | Discharge: 2016-01-09 | Disposition: A | Discharge status: Home / Self Care | Payer: Medicare Other | Source: Ambulatory Visit | Attending: Orthopedic Surgery | Admitting: Orthopedic Surgery

## 2016-01-09 ENCOUNTER — Other Ambulatory Visit: Payer: Self-pay | Admitting: Orthopedic Surgery

## 2016-01-09 DIAGNOSIS — M1611 Unilateral primary osteoarthritis, right hip: Secondary | ICD-10-CM

## 2016-01-09 MED ORDER — IOPAMIDOL (ISOVUE-M 200) INJECTION 41%
1.0000 mL | Freq: Once | INTRAMUSCULAR | Status: AC
  Administered 2016-01-09: 1 mL via INTRA_ARTICULAR

## 2016-01-09 MED ORDER — METHYLPREDNISOLONE ACETATE 40 MG/ML INJ SUSP (RADIOLOG
120.0000 mg | Freq: Once | INTRAMUSCULAR | Status: AC
Start: 2016-01-09 — End: 2016-01-09
  Administered 2016-01-09: 120 mg via INTRA_ARTICULAR

## 2016-03-13 ENCOUNTER — Ambulatory Visit (INDEPENDENT_AMBULATORY_CARE_PROVIDER_SITE_OTHER): Payer: Medicare Other | Admitting: Internal Medicine

## 2016-03-13 ENCOUNTER — Encounter: Payer: Self-pay | Admitting: Internal Medicine

## 2016-03-13 DIAGNOSIS — J0100 Acute maxillary sinusitis, unspecified: Secondary | ICD-10-CM

## 2016-03-13 MED ORDER — AMOXICILLIN 500 MG PO TABS: 1000.0000 mg | ORAL_TABLET | Freq: Two times a day (BID) | ORAL | 0 refills | Status: AC

## 2016-03-13 NOTE — Progress Notes (Signed)
Pre visit review using our clinic review tool, if applicable. No additional management support is needed unless otherwise documented below in the visit note. 

## 2016-03-13 NOTE — Progress Notes (Signed)
Subjective:    Patient ID: Chelsea Keller, female    DOB: 12-11-1934, 81 y.o.   MRN: [id number]  HPI Here due to cough and fatigue  Dry cough started about a week ago Now worsened-- some green sputum Some nausea and increased stools--- though not diarrhea Very fatigued-- exhausted  Feels drainage (PND) Frontal headache-- trying some tylenol (and benedryl at night). Occ advil for headache No fever No night sweats or chills Occasional SOB-- nothing new  Current Outpatient Prescriptions on File Prior to Visit  Medication Sig Dispense Refill  . acetaminophen (TYLENOL) 500 MG tablet Take 1,000 mg by mouth at bedtime as needed for mild pain or moderate pain.     . potassium chloride SA (K-DUR,KLOR-CON) 20 MEQ tablet TAKE 2 TABLETS (40 MEQ TOTAL) DAILY 180 tablet 0  . triamcinolone cream (KENALOG) 0.1 % Apply 1 application topically 2 (two) times daily as needed. 453.6 g 1  . vitamin B-12 (CYANOCOBALAMIN) 1000 MCG tablet Take 1,000 mcg by mouth daily.    . furosemide (LASIX) 40 MG tablet Take 1 tablet (40 mg total) by mouth daily. (Patient not taking: Reported on 03/13/2016) 90 tablet 3  . temazepam (RESTORIL) 15 MG capsule Take 1 capsule (15 mg total) by mouth at bedtime as needed for sleep. (Patient not taking: Reported on 03/13/2016) 30 capsule 0  . tiZANidine (ZANAFLEX) 2 MG tablet TAKE 1 TABLET (2 MG TOTAL) BY MOUTH EVERY 8 (EIGHT) HOURS AS NEEDED FOR MUSCLE SPASMS. (Patient not taking: Reported on 03/13/2016) 30 tablet 0  . traMADol (ULTRAM) 50 MG tablet Take 1 tablet (50 mg total) by mouth at bedtime as needed for moderate pain or severe pain. (Patient not taking: Reported on 03/13/2016) 90 tablet 0   No current facility-administered medications on file prior to visit.     Allergies  Allergen Reactions  . Codeine Sulfate Shortness Of Breath  . Celecoxib Other (See Comments)    Pt bled out  . Contrast Media [Iodinated Diagnostic Agents] Other (See Comments)    "Went numb, nausea and  vomiting"  . Erythromycin Base Nausea And Vomiting    Past Medical History:  Diagnosis Date  . Allergy   . Anemia   . Anxiety   . Arthritis   . Chronic diastolic CHF (congestive heart failure) (HCC)    a. Echo 1/16:  mild LVH, EF normal, grade 1 DD, MAC  . Chronic venous insufficiency    chronic LE edema  . Depression   . Fibromyalgia    constant pain  . Hx of cardiac catheterization    a. [facility] in [place] "ok" per patient with mild plaque in a single vessel - records not available  . Hx of cardiovascular stress test    a. Nuclear study in 2008 normal  . Hx of colonic polyps   . Hypertension   . Hypertriglyceridemia   . Impaired fasting glucose   . PONV (postoperative nausea and vomiting)   . PUD (peptic ulcer disease)    hx of gastric ulcer  . Vitamin B12 deficiency     Past Surgical History:  Procedure Laterality Date  . ABDOMINAL HYSTERECTOMY    . CATARACT EXTRACTION  10/2003   OD  . CHOLECYSTECTOMY    . COLONOSCOPY W/ POLYPECTOMY    . COLONOSCOPY WITH PROPOFOL N/A 10/15/2014   Procedure: COLONOSCOPY WITH PROPOFOL;  Surgeon: Ladene Artist, MD;  [facility];  Service: Endoscopy;  Laterality: N/A;  . ESOPHAGOGASTRODUODENOSCOPY (EGD) WITH PROPOFOL N/A 10/15/2014  Procedure: ESOPHAGOGASTRODUODENOSCOPY (EGD) WITH PROPOFOL;  Surgeon: Ladene Artist, MD;  [facility];  Service: Endoscopy;  Laterality: N/A;  . EXTERNAL FIXATION ANKLE FRACTURE     Fx.  left ankle-fixation with pins later removed sec to infection 1985  . EXTERNAL FIXATION WRIST FRACTURE  1985   left with pins  . EYE SURGERY    . FEMUR FRACTURE SURGERY  06/2009  . FRACTURE SURGERY    . HARDWARE REMOVAL Left 10/05/2013   Procedure: HARDWARE REMOVAL LEFT DISTAL FEMUR;  Surgeon: Rozanna Box, MD;  Location: [place];  Service: Orthopedics;  Laterality: Left;  . HEMIARTHROPLASTY SHOULDER FRACTURE  06/2009  . JOINT REPLACEMENT    . STERIOD INJECTION Right 10/05/2013   Procedure: STEROID  INJECTION;  Surgeon: Rozanna Box, MD;  Location: [place];  Service: Orthopedics;  Laterality: Right;  . TONSILLECTOMY    . TONSILLECTOMY    . TOTAL KNEE ARTHROPLASTY  03/09   left    Family History  Problem Relation Age of Onset  . Depression Mother   . Cancer Mother     uterine cancer  . Cancer Brother     prostate cancer  . Diabetes Maternal Aunt   . Arthritis Brother   . Asthma Brother   . Stroke Maternal Grandmother   . Heart attack Father     Social History   Social History  . Marital status: Widowed    Spouse name: N/A  . Number of children: 4  . Years of education: N/A   Occupational History  . 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 Smoking status: Former Smoker    Packs/day: 1.00    Years: 40.00    Types: Cigarettes    Quit date: 07/06/1993  . Smokeless tobacco: Never Used  . Alcohol use No  . Drug use: No  . Sexual activity: Not on file   Other Topics Concern  . Not on file   Social History Narrative   No living will   Plans to do health care POA forms---wants daughter [name]   Would accept resuscitation attempts---no prolonged ventilation   Absolutely no feeding tube   Review of Systems Appetite is okay Weight up 6#--not holding onto fluid Avoids the furosemide--due to incontinence    Objective:   Physical Exam  Constitutional: She appears well-developed and well-nourished. No distress.  HENT:  Mouth/Throat: Oropharynx is clear and moist. No oropharyngeal exudate.  Mild maxillary tenderness Mild nasal inflammation TMs normal  Neck: Normal range of motion. Neck supple. No thyromegaly present.  Pulmonary/Chest: Effort normal and breath sounds normal. No respiratory distress. She has no wheezes. She has no rales.  Lymphadenopathy:    She has no cervical adenopathy.  Skin: No rash noted.          Assessment & Plan:

## 2016-03-13 NOTE — Assessment & Plan Note (Signed)
Sick for a week and worsening Discussed supportive care Will Rx with amoxil

## 2016-03-15 ENCOUNTER — Other Ambulatory Visit: Payer: Self-pay | Admitting: Internal Medicine

## 2016-06-01 DIAGNOSIS — M19072 Primary osteoarthritis, left ankle and foot: Secondary | ICD-10-CM | POA: Diagnosis not present

## 2016-06-03 ENCOUNTER — Other Ambulatory Visit: Payer: Self-pay | Admitting: Orthopedic Surgery

## 2016-06-03 DIAGNOSIS — M1612 Unilateral primary osteoarthritis, left hip: Secondary | ICD-10-CM

## 2016-06-08 ENCOUNTER — Other Ambulatory Visit: Payer: Medicare Other

## 2016-07-16 ENCOUNTER — Encounter (HOSPITAL_COMMUNITY): Payer: Self-pay | Admitting: Emergency Medicine

## 2016-07-16 DIAGNOSIS — R51 Headache: Secondary | ICD-10-CM | POA: Insufficient documentation

## 2016-07-16 DIAGNOSIS — R0981 Nasal congestion: Secondary | ICD-10-CM | POA: Insufficient documentation

## 2016-07-16 DIAGNOSIS — Z79899 Other long term (current) drug therapy: Secondary | ICD-10-CM | POA: Insufficient documentation

## 2016-07-16 DIAGNOSIS — I110 Hypertensive heart disease with heart failure: Secondary | ICD-10-CM | POA: Insufficient documentation

## 2016-07-16 DIAGNOSIS — J0100 Acute maxillary sinusitis, unspecified: Secondary | ICD-10-CM | POA: Insufficient documentation

## 2016-07-16 DIAGNOSIS — Z96652 Presence of left artificial knee joint: Secondary | ICD-10-CM | POA: Diagnosis not present

## 2016-07-16 DIAGNOSIS — I5032 Chronic diastolic (congestive) heart failure: Secondary | ICD-10-CM | POA: Diagnosis not present

## 2016-07-16 DIAGNOSIS — Z87891 Personal history of nicotine dependence: Secondary | ICD-10-CM | POA: Diagnosis not present

## 2016-07-16 LAB — CBC
HCT: 38.9 % (ref 36.0–46.0)
Hemoglobin: 12.3 g/dL (ref 12.0–15.0)
MCH: 26.6 pg (ref 26.0–34.0)
MCHC: 31.6 g/dL (ref 30.0–36.0)
MCV: 84 fL (ref 78.0–100.0)
Platelets: 296 10*3/uL (ref 150–400)
RBC: 4.63 MIL/uL (ref 3.87–5.11)
RDW: 15.7 % — ABNORMAL HIGH (ref 11.5–15.5)
WBC: 9 10*3/uL (ref 4.0–10.5)

## 2016-07-16 LAB — BASIC METABOLIC PANEL
Anion gap: 9 (ref 5–15)
BUN: 9 mg/dL (ref 6–20)
CALCIUM: 9.3 mg/dL (ref 8.9–10.3)
CO2: 26 mmol/L (ref 22–32)
CREATININE: 0.91 mg/dL (ref 0.44–1.00)
Chloride: 105 mmol/L (ref 101–111)
GFR calc non Af Amer: 58 mL/min — ABNORMAL LOW (ref >60)
Glucose, Bld: 110 mg/dL — ABNORMAL HIGH (ref 65–99)
Potassium: 3.8 mmol/L (ref 3.5–5.1)
SODIUM: 140 mmol/L (ref 135–145)

## 2016-07-16 MED ORDER — IBUPROFEN 400 MG PO TABS
400.0000 mg | ORAL_TABLET | Freq: Once | ORAL | Status: AC
  Administered 2016-07-16: 400 mg via ORAL

## 2016-07-16 MED ORDER — ONDANSETRON 4 MG PO TBDP
4.0000 mg | ORAL_TABLET | Freq: Once | ORAL | Status: AC
  Administered 2016-07-16: 4 mg via ORAL

## 2016-07-16 MED ORDER — ONDANSETRON 4 MG PO TBDP
ORAL_TABLET | ORAL | Status: AC
  Filled 2016-07-16: qty 1

## 2016-07-16 MED ORDER — IBUPROFEN 400 MG PO TABS
ORAL_TABLET | ORAL | Status: AC
  Filled 2016-07-16: qty 1

## 2016-07-16 NOTE — ED Triage Notes (Addendum)
Pt presents to ED for assessment of headache (all over head) x 4 days.  Pt denies hx of hypertension, BP 182/89 at triage.  Pt denies any chest pain, shortness of breath.  Pt c/o nausea, denies v/d.  Pt complains of nasal congestion with cough, and green phlegm when she blows her nose.  Pt sts recent intermittent nose bleeds.

## 2016-07-17 ENCOUNTER — Emergency Department (HOSPITAL_COMMUNITY): Payer: Medicare Other

## 2016-07-17 ENCOUNTER — Emergency Department (HOSPITAL_COMMUNITY)
Admission: EM | Admit: 2016-07-17 | Discharge: 2016-07-17 | Disposition: A | Discharge status: Home / Self Care | Payer: Medicare Other | Attending: Emergency Medicine | Admitting: Emergency Medicine

## 2016-07-17 ENCOUNTER — Encounter (HOSPITAL_COMMUNITY): Payer: Self-pay | Admitting: Emergency Medicine

## 2016-07-17 DIAGNOSIS — R51 Headache: Secondary | ICD-10-CM | POA: Diagnosis not present

## 2016-07-17 DIAGNOSIS — J0100 Acute maxillary sinusitis, unspecified: Secondary | ICD-10-CM

## 2016-07-17 DIAGNOSIS — R519 Headache, unspecified: Secondary | ICD-10-CM

## 2016-07-17 MED ORDER — AMOXICILLIN-POT CLAVULANATE 875-125 MG PO TABS: 1.0000 | ORAL_TABLET | Freq: Two times a day (BID) | ORAL | 0 refills | Status: DC

## 2016-07-17 NOTE — ED Notes (Signed)
Pt returned from CT °

## 2016-07-17 NOTE — ED Provider Notes (Signed)
[name] DEPT Provider Note   CSN: [id number] Arrival date & time: 07/16/16  2017     History   Chief Complaint Chief Complaint  Patient presents with  . Headache  . Nasal Congestion    HPI Chelsea Keller is a 82 y.o. female.  HPI   82 year old female with history of allergic rhinitis, sinusitis, hypertension, CHF presenting to the ED complaining of headache. Patient report having sinus headache ongoing for more than a month. Describe pain as a throbbing aching sensation to both temporal region that is waxing and waning. For the past 2 weeks she has had nasal congestion, runny nose, tenderness to her upper gumline that feels like tooth aches but she has dentures. She also complaining of occasional dizziness. She is taking Tylenol without adequate relief. She denies fever, chills, ear pain, neck stiffness, chest pain, shortness of breath, focal numbness or weakness, or rash.  Past Medical History:  Diagnosis Date  . Allergy   . Anemia   . Anxiety   . Arthritis   . Chronic diastolic CHF (congestive heart failure) (HCC)    a. Echo 1/16:  mild LVH, EF normal, grade 1 DD, MAC  . Chronic venous insufficiency    chronic LE edema  . Depression   . Fibromyalgia    constant pain  . Hx of cardiac catheterization    a. LHC in [place] "ok" per patient with mild plaque in a single vessel - records not available  . Hx of cardiovascular stress test    a. Nuclear study in 2008 normal  . Hx of colonic polyps   . Hypertension   . Hypertriglyceridemia   . Impaired fasting glucose   . PONV (postoperative nausea and vomiting)   . PUD (peptic ulcer disease)    hx of gastric ulcer  . Vitamin B12 deficiency     Patient Active Problem List   Diagnosis Date Noted  . Acute maxillary sinusitis 02/15/2015  . Chronic diastolic congestive heart failure ([place]) 05/09/2014  . Mood disorder ([place]) 11/03/2013  . Psoriasis 02/21/2013  . Routine general medical examination at a health care facility  06/14/2012  . BMI 50.0-59.9, adult ([place]) 12/02/2011  . VENTRICULAR HYPERTROPHY, LEFT 10/19/2008  . Sleep disturbance 10/25/2007  . Venous (peripheral) insufficiency 08/11/2007  . Osteoarthritis, multiple sites 08/11/2007  . IMPAIRED FASTING GLUCOSE 08/11/2007  . COLONIC POLYPS, HX OF 05/20/2007  . B12 DEFICIENCY 09/30/2006  . Essential hypertension 09/30/2006  . Allergic rhinitis due to pollen 09/30/2006    Past Surgical History:  Procedure Laterality Date  . ABDOMINAL HYSTERECTOMY    . CATARACT EXTRACTION  10/2003   OD  . CHOLECYSTECTOMY    . COLONOSCOPY W/ POLYPECTOMY    . COLONOSCOPY WITH PROPOFOL N/A 10/15/2014   Procedure: COLONOSCOPY WITH PROPOFOL;  Surgeon: Ladene Artist, MD;  Location: [facility];  Service: Endoscopy;  Laterality: N/A;  . ESOPHAGOGASTRODUODENOSCOPY (EGD) WITH PROPOFOL N/A 10/15/2014   Procedure: ESOPHAGOGASTRODUODENOSCOPY (EGD) WITH PROPOFOL;  Surgeon: Ladene Artist, MD;  Location: [facility];  Service: Endoscopy;  Laterality: N/A;  . EXTERNAL FIXATION ANKLE FRACTURE     Fx.  left ankle-fixation with pins later removed sec to infection 1985  . EXTERNAL FIXATION WRIST FRACTURE  1985   left with pins  . EYE SURGERY    . FEMUR FRACTURE SURGERY  06/2009  . FRACTURE SURGERY    . HARDWARE REMOVAL Left 10/05/2013   Procedure: HARDWARE REMOVAL LEFT DISTAL FEMUR;  Surgeon: Rozanna Box, MD;  Location: [facility]  OR;  Service: Orthopedics;  Laterality: Left;  . HEMIARTHROPLASTY SHOULDER FRACTURE  06/2009  . JOINT REPLACEMENT    . STERIOD INJECTION Right 10/05/2013   Procedure: STEROID INJECTION;  Surgeon: Rozanna Box, MD;  Location: [facility];  Service: Orthopedics;  Laterality: Right;  . TONSILLECTOMY    . TONSILLECTOMY    . TOTAL KNEE ARTHROPLASTY  03/09   left    OB History    No data available       Home Medications    Prior to Admission medications   Medication Sig Start Date End Date Taking? Authorizing Provider  acetaminophen (TYLENOL) 500 MG  tablet Take 1,000 mg by mouth at bedtime as needed for mild pain or moderate pain.     Historical Provider, MD  furosemide (LASIX) 40 MG tablet Take 1 tablet (40 mg total) by mouth daily. Patient not taking: Reported on 03/13/2016 12/13/14   Burnell Blanks, MD  potassium chloride SA (K-DUR,KLOR-CON) 20 MEQ tablet TAKE 2 TABLETS (40 MEQ TOTAL) DAILY 03/16/16   Venia Carbon, MD  temazepam (RESTORIL) 15 MG capsule Take 1 capsule (15 mg total) by mouth at bedtime as needed for sleep. Patient not taking: Reported on 03/13/2016 12/23/15   Venia Carbon, MD  tiZANidine (ZANAFLEX) 2 MG tablet TAKE 1 TABLET (2 MG TOTAL) BY MOUTH EVERY 8 (EIGHT) HOURS AS NEEDED FOR MUSCLE SPASMS. Patient not taking: Reported on 03/13/2016 12/30/15   Tonia Ghent, MD  traMADol (ULTRAM) 50 MG tablet Take 1 tablet (50 mg total) by mouth at bedtime as needed for moderate pain or severe pain. Patient not taking: Reported on 03/13/2016 08/01/15   Venia Carbon, MD  triamcinolone cream (KENALOG) 0.1 % Apply 1 application topically 2 (two) times daily as needed. 12/20/14   Venia Carbon, MD  vitamin B-12 (CYANOCOBALAMIN) 1000 MCG tablet Take 1,000 mcg by mouth daily.    Historical Provider, MD    Family History Family History  Problem Relation Age of Onset  . Depression Mother   . Cancer Mother     uterine cancer  . Heart attack Father   . Cancer Brother     prostate cancer  . Diabetes Maternal Aunt   . Arthritis Brother   . Asthma Brother   . Stroke Maternal Grandmother     Social History Social History  Substance Use Topics  . Smoking status: Former Smoker    Packs/day: 1.00    Years: 40.00    Types: Cigarettes    Quit date: 07/06/1993  . Smokeless tobacco: Never Used  . Alcohol use No     Allergies   Codeine sulfate; Celecoxib; Contrast media [iodinated diagnostic agents]; and Erythromycin base   Review of Systems Review of Systems  All other systems reviewed and are negative.    Physical  Exam Updated Vital Signs BP 179/78   Pulse 78   Temp 98 F (36.7 C)   Resp 18   Ht _0  (1.549 m)   Wt 131.5 kg   SpO2 98%   BMI 54.80 kg/m   Physical Exam  Constitutional: She is oriented to person, place, and time. She appears well-developed and well-nourished. No distress.  HENT:  Head: Atraumatic.  Ears: Effusion noted to bilateral TM without erythema Nose: Normal nares Throat: Uvula is midline no tonsillar enlargement or exudates, no trismus patient is edentulous  Tenderness to percussion to maxillary sinus  Eyes: Conjunctivae are normal.  Neck: Neck supple.  No neck stiffness  Cardiovascular: Normal rate and regular rhythm.   Neurological: She is alert and oriented to person, place, and time. She has normal strength. No cranial nerve deficit or sensory deficit. GCS eye subscore is 4. GCS verbal subscore is 5. GCS motor subscore is 6.  Skin: No rash noted.  Psychiatric: She has a normal mood and affect.  Nursing note and vitals reviewed.    ED Treatments / Results  Labs (all labs ordered are listed, but only abnormal results are displayed) Labs Reviewed  CBC - Abnormal; Notable for the following:       Result Value   RDW 15.7 (*)    All other components within normal limits  BASIC METABOLIC PANEL - Abnormal; Notable for the following:    Glucose, Bld 110 (*)    GFR calc non Af Amer 58 (*)    All other components within normal limits    EKG  EKG Interpretation None       Radiology No results found.  Procedures Procedures (including critical care time)  Medications Ordered in ED Medications  ibuprofen (ADVIL,MOTRIN) 400 MG tablet (not administered)  ondansetron (ZOFRAN-ODT) 4 MG disintegrating tablet (not administered)  ondansetron (ZOFRAN-ODT) disintegrating tablet 4 mg (4 mg Oral Given 07/16/16 2053)  ibuprofen (ADVIL,MOTRIN) tablet 400 mg (400 mg Oral Given 07/16/16 2052)     Initial Impression / Assessment and Plan / ED Course  I have  reviewed the triage vital signs and the nursing notes.  Pertinent labs & imaging results that were available during my care of the patient were reviewed by me and considered in my medical decision making (see chart for details).  Clinical Course     BP 168/71   Pulse 77   Temp 98 F (36.7 C)   Resp 18   Ht _0  (1.549 m)   Wt 131.5 kg   SpO2 90%   BMI 54.80 kg/m    Final Clinical Impressions(s) / ED Diagnoses   Final diagnoses:  Sinus headache  Subacute maxillary sinusitis    New Prescriptions New Prescriptions   AMOXICILLIN-CLAVULANATE (AUGMENTIN) 875-125 MG TABLET    Take 1 tablet by mouth 2 (two) times daily. One po bid x 7 days   3:47 AM Patient here with sinus congestion, sinus headache. This has been ongoing for the past several weeks. She has history of maxillary sinusitis in the past. She has no red flags concerning for meningitis, subarachnoid hemorrhage, or stroke.  Suspect sinusitis as the cause of her headache. Blood work today is unremarkable, normal head CT scan. Will prescribe a course of Augmentin as treatment. Encouraged patient to follow-up with PCP or ENT for further care.  Care discussed with Dr. Venora Maples.   Domenic Moras, PA-C 07/17/16 [place], MD 07/17/16 [phone]

## 2016-07-17 NOTE — ED Notes (Signed)
Patient transported to CT 

## 2016-07-27 ENCOUNTER — Ambulatory Visit (INDEPENDENT_AMBULATORY_CARE_PROVIDER_SITE_OTHER): Payer: Medicare Other | Admitting: Cardiovascular Disease

## 2016-07-27 ENCOUNTER — Encounter: Payer: Self-pay | Admitting: Cardiovascular Disease

## 2016-07-27 VITALS — BP 152/88 | HR 83 | Ht 62.0 in | Wt 288.0 lb

## 2016-07-27 DIAGNOSIS — I10 Essential (primary) hypertension: Secondary | ICD-10-CM

## 2016-07-27 DIAGNOSIS — I5032 Chronic diastolic (congestive) heart failure: Secondary | ICD-10-CM

## 2016-07-27 MED ORDER — TRIAMTERENE-HCTZ 37.5-25 MG PO TABS: 1.0000 | ORAL_TABLET | Freq: Every day | ORAL | 3 refills | Status: DC

## 2016-07-27 MED ORDER — FUROSEMIDE 40 MG PO TABS: 40.0000 mg | ORAL_TABLET | Freq: Every day | ORAL | 3 refills | Status: DC

## 2016-07-27 MED ORDER — TRIAMTERENE-HCTZ 37.5-25 MG PO TABS: 1.0000 | ORAL_TABLET | Freq: Every day | ORAL | 6 refills | Status: DC

## 2016-07-27 NOTE — Progress Notes (Signed)
0  Chief Complaint  Patient presents with  . Annual Exam  . Edema    rignt and left foot    History of Present Illness: 82 yo WF with history of obesity, HTN and chronic lower extremity edema here today for cardiac follow up. She has a remote history of tobacco abuse. She has had a normal myoview stress test in April of 2008. Her echo in May 2010 showed normal LV function and no significant valvular issues. I saw her in January 2016 and she c/o weakness, dizziness, dyspnea.  She was seen in primary care but chest x-ray was ok. She has chronic lower extremity edema and had been off of her Lasix. She endorsed a cough productive of clear sputum. She was admitted to [facility] for diuresis and aldactone was added. Echo January 2016 with normal LV systolic function. CTA chest negative for PE (elevated d-dimer). She was found to be anemic with Hgb of 8.7 (microcytic hypochromic). She was referred to GI and underwent EGD and colonoscopy 10/15/14. This showed gastritis, AVMs in the colon, polyps and divertculosis, hemorrhoids. Her chronic dyspnea is felt to be due to her obesity, deconditioning and anemia.   She is here today for follow up. She stopped taking lasix. She has more LE edema. No chest pain or dyspnea. Taken off of maxzide in 2016 while hospitalized with anemia, BP was low.   Primary Care Physician: Viviana Simpler, MD  Past Medical History:  Diagnosis Date  . Allergy   . Anemia   . Anxiety   . Arthritis   . Chronic diastolic CHF (congestive heart failure) (HCC)    a. Echo 1/16:  mild LVH, EF normal, grade 1 DD, MAC  . Chronic venous insufficiency    chronic LE edema  . Depression   . Fibromyalgia    constant pain  . Hx of cardiac catheterization    a. LHC in [place] "ok" per patient with mild plaque in a single vessel - records not available  . Hx of cardiovascular stress test    a. Nuclear study in 2008 normal  . Hx of colonic polyps   . Hypertension   . Hypertriglyceridemia   . Impaired  fasting glucose   . PONV (postoperative nausea and vomiting)   . PUD (peptic ulcer disease)    hx of gastric ulcer  . Vitamin B12 deficiency     Past Surgical History:  Procedure Laterality Date  . ABDOMINAL HYSTERECTOMY    . CATARACT EXTRACTION  10/2003   OD  . CHOLECYSTECTOMY    . COLONOSCOPY W/ POLYPECTOMY    . COLONOSCOPY WITH PROPOFOL N/A 10/15/2014   Procedure: COLONOSCOPY WITH PROPOFOL;  Surgeon: Ladene Artist, MD;  [facility];  Service: Endoscopy;  Laterality: N/A;  . ESOPHAGOGASTRODUODENOSCOPY (EGD) WITH PROPOFOL N/A 10/15/2014   Procedure: ESOPHAGOGASTRODUODENOSCOPY (EGD) WITH PROPOFOL;  Surgeon: Ladene Artist, MD;  Location: [facility];  Service: Endoscopy;  Laterality: N/A;  . EXTERNAL FIXATION ANKLE FRACTURE     Fx.  left ankle-fixation with pins later removed sec to infection 1985  . EXTERNAL FIXATION WRIST FRACTURE  1985   left with pins  . EYE SURGERY    . FEMUR FRACTURE SURGERY  06/2009  . FRACTURE SURGERY    . HARDWARE REMOVAL Left 10/05/2013   Procedure: HARDWARE REMOVAL LEFT DISTAL FEMUR;  Surgeon: Rozanna Box, MD;  Location: [facility];  Service: Orthopedics;  Laterality: Left;  . HEMIARTHROPLASTY SHOULDER FRACTURE  06/2009  . JOINT REPLACEMENT    .  STERIOD INJECTION Right 10/05/2013   Procedure: STEROID INJECTION;  Surgeon: Rozanna Box, MD;  Location: [facility];  Service: Orthopedics;  Laterality: Right;  . TONSILLECTOMY    . TONSILLECTOMY    . TOTAL KNEE ARTHROPLASTY  03/09   left    Current Outpatient Prescriptions  Medication Sig Dispense Refill  . acetaminophen (TYLENOL) 500 MG tablet Take 1,000 mg by mouth at bedtime as needed for mild pain or moderate pain.     . furosemide (LASIX) 40 MG tablet Take 1 tablet (40 mg total) by mouth daily. 90 tablet 3  . ibuprofen (ADVIL,MOTRIN) 200 MG tablet Take 400 mg by mouth every 6 (six) hours as needed for mild pain.    Marland Kitchen triamterene-hydrochlorothiazide (MAXZIDE-25) 37.5-25 MG tablet Take 1 tablet  by mouth daily. 90 tablet 3  . vitamin B-12 (CYANOCOBALAMIN) 1000 MCG tablet Take 1,000 mcg by mouth daily.     No current facility-administered medications for this visit.     Allergies  Allergen Reactions  . Codeine Sulfate Shortness Of Breath  . Celecoxib Other (See Comments)    Pt bled out  . Contrast Media [Iodinated Diagnostic Agents] Other (See Comments)    "Went numb, nausea and vomiting"  . Erythromycin Base Nausea And Vomiting    Social History   Social History  . Marital status: Widowed    Spouse name: N/A  . Number of children: 4  . Years of education: N/A   Occupational History  . 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 Smoking status: Former Smoker    Packs/day: 1.00    Years: 40.00    Types: Cigarettes    Quit date: 07/06/1993  . Smokeless tobacco: Never Used  . Alcohol use No  . Drug use: No  . Sexual activity: Not on file   Other Topics Concern  . Not on file   Social History Narrative   No living will   Plans to do health care POA forms---wants daughter [name]   Would accept resuscitation attempts---no prolonged ventilation   Absolutely no feeding tube    Family History  Problem Relation Age of Onset  . Depression Mother   . Cancer Mother     uterine cancer  . Heart attack Father   . Cancer Brother     prostate cancer  . Diabetes Maternal Aunt   . Arthritis Brother   . Asthma Brother   . Stroke Maternal Grandmother     Review of Systems:  As stated in the HPI and otherwise negative.   BP (!) 152/88   Pulse 83   Ht 5' 2"  (1.575 m)   Wt 288 lb (130.6 kg)   BMI 52.68 kg/m   Physical Examination: General: Well developed, well nourished, NAD  HEENT: OP clear, mucus membranes moist  SKIN: warm, dry. No rashes. Neuro: No focal deficits  Musculoskeletal: Muscle strength 5/5 all ext  Psychiatric: Mood and affect normal  Neck: No JVD, no carotid bruits, no thyromegaly, no  lymphadenopathy.  Lungs:Clear bilaterally, no wheezes, rhonci, crackles Cardiovascular: Regular rate and rhythm. No murmurs, gallops or rubs. Abdomen:Soft. Bowel sounds present. Non-tender.  Extremities: 1+ bilateral lower extremity edema. Pulses are hard to palpate bilaterally secondary to edema.   Echo January 2016:  Left ventricle: The cavity size was normal. There was mild concentric hypertrophy. Systolic function was normal. Wall motion was normal; there were no regional wall motion abnormalities. Doppler  parameters are consistent with abnormal left ventricular relaxation (grade 1 diastolic dysfunction). - Mitral valve: Calcified annulus.  EKG:  EKG is ordered today. The ekg ordered today demonstrates NSR, rate 83 bpm.   Recent Labs: 12/23/2015: ALT 12 07/16/2016: BUN 9; Creatinine, Ser 0.91; Hemoglobin 12.3; Platelets 296; Potassium 3.8; Sodium 140   Lipid Panel    Component Value Date/Time   CHOL 166 09/04/2013 1134   TRIG 145.0 09/04/2013 1134   HDL 46.10 09/04/2013 1134   CHOLHDL 4 09/04/2013 1134   VLDL 29.0 09/04/2013 1134   LDLCALC 91 09/04/2013 1134   LDLDIRECT 86.2 05/25/2008 1115     Wt Readings from Last 3 Encounters:  07/27/16 288 lb (130.6 kg)  07/16/16 290 lb (131.5 kg)  03/13/16 290 lb (131.5 kg)     Other studies Reviewed: Additional studies/ records that were reviewed today include:  Review of the above records demonstrates:    Assessment and Plan:   1. Chronic diastolic CHF:. Echo January 2016 with normal LV function. Volume status is up some. She  Has not been taking Lasix for the last year. Will restart Lasix 40 mg daily now.    2. HTN: BP is elevated today and was elevated in the ED last week. Will restart Maxzide 37.5/25. BMET one week.   Current medicines are reviewed at length with the patient today.  The patient does not have concerns regarding medicines.  The following changes have been made:  no change  Labs/ tests ordered  today include:   Orders Placed This Encounter  Procedures  . Basic Metabolic Panel (BMET)  . EKG 12-Lead     Disposition:   FU with me in 12  months   Signed, Lauree Chandler, MD 07/28/2016 7:39 AM    [facility] [place], [place], [place]  [address] Phone: [phone]; Fax: [phone]

## 2016-07-27 NOTE — Patient Instructions (Addendum)
Medication Instructions:  Your physician has recommended you make the following change in your medication:  Start Maxzide 37.5/25 mg by mouth daily. Start furosemide 40 mg by mouth daily.    Labwork: Your physician recommends that you return for lab work on January 26,2018.  The lab is open from 7:30 AM to 4:45 PM   Testing/Procedures: none  Follow-Up: Your physician recommends that you schedule a follow-up appointment in: 12 months.  Please call our office in about 9 months to schedule this appointment    Any Other Special Instructions Will Be Listed Below (If Applicable).     If you need a refill on your cardiac medications before your next appointment, please call your pharmacy.

## 2016-07-31 ENCOUNTER — Other Ambulatory Visit: Payer: Medicare Other

## 2016-08-07 ENCOUNTER — Other Ambulatory Visit: Payer: Medicare Other | Admitting: *Deleted

## 2016-08-07 DIAGNOSIS — I5032 Chronic diastolic (congestive) heart failure: Secondary | ICD-10-CM

## 2016-08-07 DIAGNOSIS — I10 Essential (primary) hypertension: Secondary | ICD-10-CM

## 2016-08-07 LAB — BASIC METABOLIC PANEL
BUN/Creatinine Ratio: 16 (ref 12–28)
BUN: 16 mg/dL (ref 8–27)
CALCIUM: 9.5 mg/dL (ref 8.7–10.3)
CHLORIDE: 99 mmol/L (ref 96–106)
CO2: 24 mmol/L (ref 18–29)
CREATININE: 0.98 mg/dL (ref 0.57–1.00)
GFR, EST AFRICAN AMERICAN: 63 mL/min/{1.73_m2} (ref >59)
GFR, EST NON AFRICAN AMERICAN: 55 mL/min/{1.73_m2} — AB (ref >59)
Glucose: 106 mg/dL — ABNORMAL HIGH (ref 65–99)
Potassium: 4.1 mmol/L (ref 3.5–5.2)
Sodium: 142 mmol/L (ref 134–144)

## 2016-08-10 ENCOUNTER — Telehealth: Payer: Self-pay | Admitting: Cardiovascular Disease

## 2016-08-10 NOTE — Telephone Encounter (Signed)
PT AWARE OF LAB RESULTS./CY 

## 2016-08-10 NOTE — Telephone Encounter (Signed)
New message    Pt verbalized that she is returning call for lab results

## 2016-09-08 ENCOUNTER — Telehealth: Payer: Self-pay | Admitting: Internal Medicine

## 2016-09-08 NOTE — Telephone Encounter (Signed)
Please call to check on pt tomorrow. 

## 2016-09-08 NOTE — Telephone Encounter (Signed)
Patient Name: Chelsea Keller  DOB: 28-Dec-1934    Initial Comment Caller states she is having issues with BP, dull headache, nose bleeds, dizziness. Don't have the means to take BP, but she can tell it is up. She is asking to make an appt to be seen today.    Nurse Assessment  Nurse: Leilani Merl, RN, Heather Date/Time (Eastern Time): 09/08/2016 9:55:04 AM  Confirm and document reason for call. If symptomatic, describe symptoms. ---Caller states she is having issues with BP, dull headache, nose bleeds, dizziness. Don't have the means to take BP, but she can tell it is up. She is asking to make an appt to be seen today.  Does the patient have any new or worsening symptoms? ---Yes  Will a triage be completed? ---Yes  Related visit to physician within the last 2 weeks? ---No  Does the PT have any chronic conditions? (i.e. diabetes, asthma, etc.) ---Yes  List chronic conditions. ---See MR  Is this a behavioral health or substance abuse call? ---No     Guidelines    Guideline Title Affirmed Question Affirmed Notes  Dizziness - Lightheadedness Difficulty breathing    Final Disposition User   Go to ED Now [place], RN, [employer] called back.   Referrals  [facility] - ED   Disagree/Comply: Comply

## 2016-09-09 NOTE — Telephone Encounter (Signed)
Spoke to pt. She said she is feeling a lot better. Slept a lot yesterday.

## 2016-11-16 DIAGNOSIS — M19072 Primary osteoarthritis, left ankle and foot: Secondary | ICD-10-CM | POA: Diagnosis not present

## 2016-12-24 ENCOUNTER — Encounter: Payer: Medicare Other | Admitting: Internal Medicine

## 2016-12-28 ENCOUNTER — Encounter: Payer: Medicare Other | Admitting: Internal Medicine

## 2017-01-16 ENCOUNTER — Encounter (HOSPITAL_COMMUNITY): Payer: Self-pay | Admitting: Emergency Medicine

## 2017-01-16 ENCOUNTER — Emergency Department (HOSPITAL_COMMUNITY)
Admission: EM | Admit: 2017-01-16 | Discharge: 2017-01-16 | Disposition: A | Discharge status: Home / Self Care | Payer: Medicare Other | Attending: Emergency Medicine | Admitting: Emergency Medicine

## 2017-01-16 DIAGNOSIS — R609 Edema, unspecified: Secondary | ICD-10-CM

## 2017-01-16 DIAGNOSIS — I110 Hypertensive heart disease with heart failure: Secondary | ICD-10-CM | POA: Diagnosis not present

## 2017-01-16 DIAGNOSIS — R2243 Localized swelling, mass and lump, lower limb, bilateral: Secondary | ICD-10-CM | POA: Diagnosis not present

## 2017-01-16 DIAGNOSIS — R600 Localized edema: Secondary | ICD-10-CM | POA: Diagnosis not present

## 2017-01-16 DIAGNOSIS — Z96652 Presence of left artificial knee joint: Secondary | ICD-10-CM | POA: Diagnosis not present

## 2017-01-16 DIAGNOSIS — F329 Major depressive disorder, single episode, unspecified: Secondary | ICD-10-CM | POA: Insufficient documentation

## 2017-01-16 DIAGNOSIS — Z9049 Acquired absence of other specified parts of digestive tract: Secondary | ICD-10-CM | POA: Insufficient documentation

## 2017-01-16 DIAGNOSIS — I5032 Chronic diastolic (congestive) heart failure: Secondary | ICD-10-CM | POA: Diagnosis not present

## 2017-01-16 DIAGNOSIS — Z79899 Other long term (current) drug therapy: Secondary | ICD-10-CM | POA: Diagnosis not present

## 2017-01-16 DIAGNOSIS — Z87891 Personal history of nicotine dependence: Secondary | ICD-10-CM | POA: Diagnosis not present

## 2017-01-16 DIAGNOSIS — F419 Anxiety disorder, unspecified: Secondary | ICD-10-CM | POA: Diagnosis not present

## 2017-01-16 LAB — BASIC METABOLIC PANEL
ANION GAP: 9 (ref 5–15)
BUN: 15 mg/dL (ref 6–20)
CALCIUM: 9.3 mg/dL (ref 8.9–10.3)
CO2: 24 mmol/L (ref 22–32)
Chloride: 107 mmol/L (ref 101–111)
Creatinine, Ser: 1 mg/dL (ref 0.44–1.00)
GFR calc Af Amer: >60 mL/min (ref >60)
GFR, EST NON AFRICAN AMERICAN: 52 mL/min — AB (ref >60)
GLUCOSE: 118 mg/dL — AB (ref 65–99)
POTASSIUM: 4.5 mmol/L (ref 3.5–5.1)
SODIUM: 140 mmol/L (ref 135–145)

## 2017-01-16 LAB — CBC WITH DIFFERENTIAL/PLATELET
BASOS ABS: 0.1 10*3/uL (ref 0.0–0.1)
Basophils Relative: 1 %
EOS ABS: 0.5 10*3/uL (ref 0.0–0.7)
EOS PCT: 5 %
HCT: 38.8 % (ref 36.0–46.0)
Hemoglobin: 11.8 g/dL — ABNORMAL LOW (ref 12.0–15.0)
LYMPHS PCT: 26 %
Lymphs Abs: 2.4 10*3/uL (ref 0.7–4.0)
MCH: 26 pg (ref 26.0–34.0)
MCHC: 30.4 g/dL (ref 30.0–36.0)
MCV: 85.5 fL (ref 78.0–100.0)
MONO ABS: 0.7 10*3/uL (ref 0.1–1.0)
Monocytes Relative: 7 %
Neutro Abs: 5.7 10*3/uL (ref 1.7–7.7)
Neutrophils Relative %: 61 %
PLATELETS: 285 10*3/uL (ref 150–400)
RBC: 4.54 MIL/uL (ref 3.87–5.11)
RDW: 16.3 % — AB (ref 11.5–15.5)
WBC: 9.3 10*3/uL (ref 4.0–10.5)

## 2017-01-16 NOTE — ED Triage Notes (Signed)
Pt to ED c/o significant amount of weeping from her L lower leg x 24 hours. Pt states her R ankle is usually the one that weeps and it's never happened from the left, nor has it ever been this much. Non-adherent pad and gauze soaked with fluid upon arrival, skin still weeping. She called her PCP's on-call nurse and she suggested she come to ED. Pt denies any other symptoms - ankles are swollen bilaterally, but pt reports this is normal. Denies pain/SOB. Pt ambulatory using rolling walker/cane - steady gait.

## 2017-01-16 NOTE — ED Notes (Signed)
Pt verbalized understanding discharge instructions and denies any further needs or questions at this time. VS stable, ambulatory and steady gait.   

## 2017-01-16 NOTE — ED Provider Notes (Signed)
[facility] DEPT Provider Note   CSN: [id number] Arrival date & time: 01/16/17  1607     History   Chief Complaint Chief Complaint  Patient presents with  . Leg Swelling    Weeping    HPI Chelsea Keller is a 82 y.o. female.  The history is provided by the patient.   CC: Leg swelling  Onset/Duration: Years Timing: Constant/worsening Location: Bilateral lower extremity Quality: Peripheral edema Severity: Moderate Modifying Factors:  Improved by: Nothing  Worsened by: Noncompliance with Lasix Associated Signs/Symptoms:  Pertinent (+): Clear fluid weeping  Pertinent (-): Chest pain, shortness of breath, dyspnea on exertion, erythema, leg pain Context: Patient with a history of peripheral edema and chronic diastolic heart failure noncompliant with Lasix.  Past Medical History:  Diagnosis Date  . Allergy   . Anemia   . Anxiety   . Arthritis   . Chronic diastolic CHF (congestive heart failure) (HCC)    a. Echo 1/16:  mild LVH, EF normal, grade 1 DD, MAC  . Chronic venous insufficiency    chronic LE edema  . Depression   . Fibromyalgia    constant pain  . Hx of cardiac catheterization    a. LHC in [place] "ok" per patient with mild plaque in a single vessel - records not available  . Hx of cardiovascular stress test    a. Nuclear study in 2008 normal  . Hx of colonic polyps   . Hypertension   . Hypertriglyceridemia   . Impaired fasting glucose   . PONV (postoperative nausea and vomiting)   . PUD (peptic ulcer disease)    hx of gastric ulcer  . Vitamin B12 deficiency     Patient Active Problem List   Diagnosis Date Noted  . Acute maxillary sinusitis 02/15/2015  . Chronic diastolic congestive heart failure ([place]) 05/09/2014  . Mood disorder ([place]) 11/03/2013  . Psoriasis 02/21/2013  . Routine general medical examination at a health care facility 06/14/2012  . BMI 50.0-59.9, adult ([place]) 12/02/2011  . VENTRICULAR HYPERTROPHY, LEFT 10/19/2008  . Sleep disturbance  10/25/2007  . Venous (peripheral) insufficiency 08/11/2007  . Osteoarthritis, multiple sites 08/11/2007  . IMPAIRED FASTING GLUCOSE 08/11/2007  . COLONIC POLYPS, HX OF 05/20/2007  . B12 DEFICIENCY 09/30/2006  . Essential hypertension 09/30/2006  . Allergic rhinitis due to pollen 09/30/2006    Past Surgical History:  Procedure Laterality Date  . ABDOMINAL HYSTERECTOMY    . CATARACT EXTRACTION  10/2003   OD  . CHOLECYSTECTOMY    . COLONOSCOPY W/ POLYPECTOMY    . COLONOSCOPY WITH PROPOFOL N/A 10/15/2014   Procedure: COLONOSCOPY WITH PROPOFOL;  Surgeon: Ladene Artist, MD;  Location: [facility];  Service: Endoscopy;  Laterality: N/A;  . ESOPHAGOGASTRODUODENOSCOPY (EGD) WITH PROPOFOL N/A 10/15/2014   Procedure: ESOPHAGOGASTRODUODENOSCOPY (EGD) WITH PROPOFOL;  Surgeon: Ladene Artist, MD;  Location: [facility];  Service: Endoscopy;  Laterality: N/A;  . EXTERNAL FIXATION ANKLE FRACTURE     Fx.  left ankle-fixation with pins later removed sec to infection 1985  . EXTERNAL FIXATION WRIST FRACTURE  1985   left with pins  . EYE SURGERY    . FEMUR FRACTURE SURGERY  06/2009  . FRACTURE SURGERY    . HARDWARE REMOVAL Left 10/05/2013   Procedure: HARDWARE REMOVAL LEFT DISTAL FEMUR;  Surgeon: Rozanna Box, MD;  Location: [facility];  Service: Orthopedics;  Laterality: Left;  . HEMIARTHROPLASTY SHOULDER FRACTURE  06/2009  . JOINT REPLACEMENT    . STERIOD INJECTION Right 10/05/2013  Procedure: STEROID INJECTION;  Surgeon: Rozanna Box, MD;  Location: [facility];  Service: Orthopedics;  Laterality: Right;  . TONSILLECTOMY    . TONSILLECTOMY    . TOTAL KNEE ARTHROPLASTY  03/09   left    OB History    No data available       Home Medications    Prior to Admission medications   Medication Sig Start Date End Date Taking? Authorizing Provider  acetaminophen (TYLENOL) 500 MG tablet Take 1,000 mg by mouth at bedtime as needed for mild pain or moderate pain.     [provider]    furosemide (LASIX) 40 MG tablet Take 1 tablet (40 mg total) by mouth daily. 07/27/16 10/25/16  Burnell Blanks, MD  ibuprofen (ADVIL,MOTRIN) 200 MG tablet Take 400 mg by mouth every 6 (six) hours as needed for mild pain.    [provider]  triamterene-hydrochlorothiazide (MAXZIDE-25) 37.5-25 MG tablet Take 1 tablet by mouth daily. 07/27/16   Burnell Blanks, MD  vitamin B-12 (CYANOCOBALAMIN) 1000 MCG tablet Take 1,000 mcg by mouth daily.    [provider]    Family History Family History  Problem Relation Age of Onset  . Depression Mother   . Cancer Mother        uterine cancer  . Heart attack Father   . Cancer Brother        prostate cancer  . Diabetes Maternal Aunt   . Arthritis Brother   . Asthma Brother   . Stroke Maternal Grandmother     Social History Social History  Substance Use Topics  . Smoking status: Former Smoker    Packs/day: 1.00    Years: 40.00    Types: Cigarettes    Quit date: 07/06/1993  . Smokeless tobacco: Never Used  . Alcohol use No     Allergies   Codeine sulfate; Celecoxib; Contrast media [iodinated diagnostic agents]; and Erythromycin base   Review of Systems Review of Systems All other systems are reviewed and are negative for acute change except as noted in the HPI   Physical Exam Updated Vital Signs BP (!) 144/69   Pulse 74   Temp 97.8 F (36.6 C) (Oral)   Resp 16   Ht _0  (1.575 m)   Wt 124.7 kg (275 lb)   SpO2 100%   BMI 50.30 kg/m   Physical Exam  Constitutional: She is oriented to person, place, and time. She appears well-developed and well-nourished. No distress.  HENT:  Head: Normocephalic and atraumatic.  Nose: Nose normal.  Eyes: Pupils are equal, round, and reactive to light. Conjunctivae and EOM are normal. Right eye exhibits no discharge. Left eye exhibits no discharge. No scleral icterus.  Neck: Normal range of motion. Neck supple.  Cardiovascular: Normal rate and regular rhythm.   Exam reveals no gallop and no friction rub.   No murmur heard. Pulmonary/Chest: Effort normal and breath sounds normal. No stridor. No respiratory distress. She has no rales.  Abdominal: Soft. She exhibits no distension. There is no tenderness.  Musculoskeletal: She exhibits no edema or tenderness.  2+ bilateral lower extremity edema  Neurological: She is alert and oriented to person, place, and time.  Skin: Skin is warm and dry. No rash noted. She is not diaphoretic. No erythema.  Psychiatric: She has a normal mood and affect.  Vitals reviewed.    ED Treatments / Results  Labs (all labs ordered are listed, but only abnormal results are displayed) Labs Reviewed  CBC  WITH DIFFERENTIAL/PLATELET - Abnormal; Notable for the following:       Result Value   Hemoglobin 11.8 (*)    RDW 16.3 (*)    All other components within normal limits  BASIC METABOLIC PANEL - Abnormal; Notable for the following:    Glucose, Bld 118 (*)    GFR calc non Af Amer 52 (*)    All other components within normal limits    EKG  EKG Interpretation None       Radiology No results found.  Procedures Procedures (including critical care time)  Medications Ordered in ED Medications - No data to display   Initial Impression / Assessment and Plan / ED Course  I have reviewed the triage vital signs and the nursing notes.  Pertinent labs & imaging results that were available during my care of the patient were reviewed by me and considered in my medical decision making (see chart for details).     Peripheral edema and a noncompliant patient. No evidence of cellulitis. Low suspicion for DVT. No evidence to suggest CHF exacerbation.  Recommended patient start taking her Lasix and follow up with her primary care provider.  The patient is safe for discharge with strict return precautions.   Final Clinical Impressions(s) / ED Diagnoses   Final diagnoses:  Peripheral edema   Disposition:  Discharge  Condition: Good  I have discussed the results, Dx and Tx plan with the patient who expressed understanding and agree(s) with the plan. Discharge instructions discussed at great length. The patient was given strict return precautions who verbalized understanding of the instructions. No further questions at time of discharge.    Discharge Medication List as of 01/16/2017  9:40 PM      Follow Up: Venia Carbon, MD [place] [id number] [phone]  Schedule an appointment as soon as possible for a visit in 2 weeks If symptoms do not improve or  worsen      Cardama, Grayce Sessions, MD 01/17/17 0050

## 2017-01-18 ENCOUNTER — Telehealth: Payer: Self-pay

## 2017-01-18 NOTE — Telephone Encounter (Signed)
PLEASE NOTE: All timestamps contained within this report are represented as [place] Standard Time. CONFIDENTIALTY NOTICE: This fax transmission is intended only for the addressee. It contains information that is legally privileged, confidential or otherwise protected from use or disclosure. If you are not the intended recipient, you are strictly prohibited from reviewing, disclosing, copying using or disseminating any of this information or taking any action in reliance on or regarding this information. If you have received this fax in error, please notify us immediately by telephone so that we can arrange for its return to [place]. Phone: [phone], Toll-Free: [phone], Fax: [phone] Page: 1 of 1 Call Id: [id number] [place] Patient Name: Chelsea Keller Gender: Female DOB: December 13, 1934 Age: 82 Y 2 M 20 D Return Phone Number: [phone] (Primary), [phone] (Secondary) City/State/Zip: [place] Client Friona Night - Client Client Site [facility] - Night Physician Viviana Simpler - MD Who Is Calling Patient / Member / Family / Caregiver Call Type Triage / Clinical Relationship To Patient Self Return Phone Number [phone] (Primary) Chief Complaint Feet swelling Reason for Call Symptomatic / Request for Health Information Initial Comment Very swollen feet and ankles to the point that they are leaking Nurse Assessment Nurse: Junius Creamer, RN, Hilda Blades Date/Time (Eastern Time): 01/15/2017 11:35:27 PM Confirm and document reason for call. If symptomatic, describe symptoms. ---caller states very swollen feet and ankles to the point that they are leaking on the left. denies pain. states takes fluid medication but is lazy about taking it due to incontinence. swelling got worse after dinner today. Does the PT have any chronic conditions? (i.e.  diabetes, asthma, etc.) ---Yes List chronic conditions. ---htn Guidelines Guideline Title Affirmed Question Leg Swelling and Edema SEVERE leg swelling (e.g., swelling extends above knee, entire leg is swollen, weeping fluid) Disp. Time Eilene Ghazi Time) Disposition Final User 01/15/2017 11:43:56 PM See Physician within 4 Hours (or PCP triage) Yes Junius Creamer, RN, [facility] - ED Care Advice Given Per Guideline SEE PHYSICIAN WITHIN 4 HOURS (or PCP triage): * IF OFFICE WILL BE CLOSED AND NO PCP TRIAGE: You need to be seen within the next 3 or 4 hours. A nearby Urgent Care Center is often a good source of care. Another choice is to go to the ER. Go sooner if you become worse. CALL BACK IF: * You become worse. CARE ADVICE given per Leg Swelling and Edema (Adult) guideline.

## 2017-01-18 NOTE — Telephone Encounter (Signed)
Evaluation was benign No CHF, DVT, infection Will review at her visit later this week

## 2017-01-18 NOTE — Telephone Encounter (Signed)
Per chart review tab pt seen [address] on 01/16/17. Pt has CPX scheduled on 01/19/17 also.

## 2017-01-19 ENCOUNTER — Encounter: Payer: Self-pay | Admitting: Internal Medicine

## 2017-01-19 ENCOUNTER — Ambulatory Visit (INDEPENDENT_AMBULATORY_CARE_PROVIDER_SITE_OTHER): Payer: Medicare Other | Admitting: Internal Medicine

## 2017-01-19 VITALS — BP 132/70 | HR 86 | Temp 97.8°F | Ht 62.0 in | Wt 282.0 lb

## 2017-01-19 DIAGNOSIS — Z0000 Encounter for general adult medical examination without abnormal findings: Secondary | ICD-10-CM | POA: Diagnosis not present

## 2017-01-19 DIAGNOSIS — I5032 Chronic diastolic (congestive) heart failure: Secondary | ICD-10-CM

## 2017-01-19 DIAGNOSIS — Z6841 Body Mass Index (BMI) 40.0 and over, adult: Secondary | ICD-10-CM

## 2017-01-19 DIAGNOSIS — F39 Unspecified mood [affective] disorder: Secondary | ICD-10-CM | POA: Diagnosis not present

## 2017-01-19 DIAGNOSIS — Z7189 Other specified counseling: Secondary | ICD-10-CM | POA: Diagnosis not present

## 2017-01-19 DIAGNOSIS — I872 Venous insufficiency (chronic) (peripheral): Secondary | ICD-10-CM

## 2017-01-19 DIAGNOSIS — I10 Essential (primary) hypertension: Secondary | ICD-10-CM

## 2017-01-19 MED ORDER — FUROSEMIDE 40 MG PO TABS: 40.0000 mg | ORAL_TABLET | Freq: Every day | ORAL | 3 refills | Status: DC

## 2017-01-19 NOTE — Assessment & Plan Note (Signed)
Needs the lasix

## 2017-01-19 NOTE — Progress Notes (Signed)
Subjective:    Patient ID: Chelsea Keller, female    DOB: [birth year], 82 y.o.   MRN: [id number]  HPI Here for Medicare wellness visit and follow up of chronic health conditions Daughter is here Reviewed form and advanced directives Reviewed other doctors No alcohol or tobacco Not able to exercise---walks and does stairs in house No falls Vision is fading--needs to be rechecked Hearing is okay No apparent memory issues She cooks and does laundry. Just doesn't clean. Walks with rollator all the time now  Reviewed recent ER visit Chronic edema but it was weeping and worse Happened (as usual) after travelling (to graduations) She doesn't take the furosemide then Stable DOE--like going up her stairs. Some wheezing No chest pain No palpitations Some dizziness but no syncope--often just when lying down Props in bed--no PND like that  Terrible urine incontinence Has to wear protection and it bothers her terribly  No persistent depression "Comes and goes" Not anhedonic--enjoys family, etc Feels her anxiety is better  Wants to lose weight but no specific plan Hasn't lost anything Daughter does note she likes to snack at night-- like ice cream  Current Outpatient Prescriptions on File Prior to Visit  Medication Sig Dispense Refill  . acetaminophen (TYLENOL) 500 MG tablet Take 1,000 mg by mouth at bedtime as needed for mild pain or moderate pain.     Marland Kitchen ibuprofen (ADVIL,MOTRIN) 200 MG tablet Take 400 mg by mouth every 6 (six) hours as needed for mild pain.    Marland Kitchen triamterene-hydrochlorothiazide (MAXZIDE-25) 37.5-25 MG tablet Take 1 tablet by mouth daily. 90 tablet 3  . vitamin B-12 (CYANOCOBALAMIN) 1000 MCG tablet Take 1,000 mcg by mouth daily.    . furosemide (LASIX) 40 MG tablet Take 1 tablet (40 mg total) by mouth daily. 90 tablet 3   No current facility-administered medications on file prior to visit.     Allergies  Allergen Reactions  . Codeine Sulfate Shortness Of Breath    . Celecoxib Other (See Comments)    Pt bled out  . Contrast Media [Iodinated Diagnostic Agents] Other (See Comments)    "Went numb, nausea and vomiting"  Recently had contrast and had no problems, stated the first time she had it caused N/V but she has had it since and was fine   . Erythromycin Base Nausea And Vomiting    Past Medical History:  Diagnosis Date  . Allergy   . Anemia   . Anxiety   . Arthritis   . Chronic diastolic CHF (congestive heart failure) (HCC)    a. Echo 1/16:  mild LVH, EF normal, grade 1 DD, MAC  . Chronic venous insufficiency    chronic LE edema  . Depression   . Fibromyalgia    constant pain  . Hx of cardiac catheterization    a. LHC in [place] "ok" per patient with mild plaque in a single vessel - records not available  . Hx of cardiovascular stress test    a. Nuclear study in 2008 normal  . Hx of colonic polyps   . Hypertension   . Hypertriglyceridemia   . Impaired fasting glucose   . PONV (postoperative nausea and vomiting)   . PUD (peptic ulcer disease)    hx of gastric ulcer  . Vitamin B12 deficiency     Past Surgical History:  Procedure Laterality Date  . ABDOMINAL HYSTERECTOMY    . CATARACT EXTRACTION  10/2003   OD  . CHOLECYSTECTOMY    . COLONOSCOPY W/ POLYPECTOMY    .  COLONOSCOPY WITH PROPOFOL N/A 10/15/2014   Procedure: COLONOSCOPY WITH PROPOFOL;  Surgeon: Ladene Artist, MD;  [facility];  Service: Endoscopy;  Laterality: N/A;  . ESOPHAGOGASTRODUODENOSCOPY (EGD) WITH PROPOFOL N/A 10/15/2014   Procedure: ESOPHAGOGASTRODUODENOSCOPY (EGD) WITH PROPOFOL;  Surgeon: Ladene Artist, MD;  [facility];  Service: Endoscopy;  Laterality: N/A;  . EXTERNAL FIXATION ANKLE FRACTURE     Fx.  left ankle-fixation with pins later removed sec to infection 1985  . EXTERNAL FIXATION WRIST FRACTURE  1985   left with pins  . EYE SURGERY    . FEMUR FRACTURE SURGERY  06/2009  . FRACTURE SURGERY    . HARDWARE REMOVAL Left 10/05/2013    Procedure: HARDWARE REMOVAL LEFT DISTAL FEMUR;  Surgeon: Rozanna Box, MD;  Location: [facility];  Service: Orthopedics;  Laterality: Left;  . HEMIARTHROPLASTY SHOULDER FRACTURE  06/2009  . JOINT REPLACEMENT    . STERIOD INJECTION Right 10/05/2013   Procedure: STEROID INJECTION;  Surgeon: Rozanna Box, MD;  Location: [facility];  Service: Orthopedics;  Laterality: Right;  . TONSILLECTOMY    . TONSILLECTOMY    . TOTAL KNEE ARTHROPLASTY  03/09   left    Family History  Problem Relation Age of Onset  . Depression Mother   . Cancer Mother        uterine cancer  . Heart attack Father   . Cancer Brother        prostate cancer  . Diabetes Maternal Aunt   . Arthritis Brother   . Asthma Brother   . Stroke Maternal Grandmother     Social History   Social History  . Marital status: Widowed    Spouse name: N/A  . Number of children: 4  . Years of education: N/A   Occupational History  . 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 Smoking status: Former Smoker    Packs/day: 1.00    Years: 40.00    Types: Cigarettes    Quit date: 07/06/1993  . Smokeless tobacco: Never Used  . Alcohol use No  . Drug use: No  . Sexual activity: Not on file   Other Topics Concern  . Not on file   Social History Narrative   No living will   Plans to do health care POA forms---wants daughter [name]   Would accept resuscitation attempts---no prolonged ventilation   Absolutely no feeding tube   Review of Systems Appetite is fine Sleeps fairly well--some trouble initiating (tends to go to sleep 4AM and sleep till noon) Had shot in foot to help pain--only helped for 2 weeks Chronic joint pains all over--- tylenol helps slightly--rare advil Bowels are fine. No blood Chronic psoriasis--uses cream. No chronic venous ulcers No heartburn or dysphagia Full dentures--wears them all the time    Objective:   Physical Exam  Constitutional: She is oriented to  person, place, and time. She appears well-nourished. No distress.  HENT:  Mouth/Throat: Oropharynx is clear and moist. No oropharyngeal exudate.  Neck: No thyromegaly present.  Cardiovascular: Normal rate, regular rhythm, normal heart sounds and intact distal pulses.  Exam reveals no gallop.   No murmur heard. 1+ pulse right foot, faint on left  Pulmonary/Chest: Effort normal and breath sounds normal. No respiratory distress. She has no wheezes. She has no rales.  Abdominal: Soft. There is no tenderness.  Musculoskeletal:  2+ edema in calves--some in ankles  Lymphadenopathy:  She has no cervical adenopathy.  Neurological: She is alert and oriented to person, place, and time.  President --- "Dwaine Deter, Bush" [phone] [id number] Recall 3/3  Skin:  Stasis changes bilateral calves Slight psoriatic lesions on right calf No ulcers  Psychiatric: She has a normal mood and affect. Her behavior is normal.          Assessment & Plan:

## 2017-01-19 NOTE — Assessment & Plan Note (Signed)
See social history Blank forms given again 

## 2017-01-19 NOTE — Assessment & Plan Note (Addendum)
Discussed consistent changes to help this Even to work towards losing 10#

## 2017-01-19 NOTE — Assessment & Plan Note (Signed)
I have personally reviewed the Medicare Annual Wellness questionnaire and have noted 1. The patient's medical and social history 2. Their use of alcohol, tobacco or illicit drugs 3. Their current medications and supplements 4. The patient's functional ability including ADL's, fall risks, home safety risks and hearing or visual             impairment. 5. Diet and physical activities 6. Evidence for depression or mood disorders  The patients weight, height, BMI and visual acuity have been recorded in the chart I have made referrals, counseling and provided education to the patient based review of the above and I have provided the pt with a written personalized care plan for preventive services.  I have provided you with a copy of your personalized plan for preventive services. Please take the time to review along with your updated medication list.  She prefers no vaccines No cancer screening due to age Discussed fitness, healthy eating

## 2017-01-19 NOTE — Assessment & Plan Note (Signed)
Mild symptoms now No Rx needed

## 2017-01-19 NOTE — Assessment & Plan Note (Signed)
Some DOE which may be fluid related (discussed needing to take the furosemide more regularly) No overt CHF

## 2017-01-19 NOTE — Assessment & Plan Note (Signed)
BP Readings from Last 3 Encounters:  01/19/17 132/70  01/16/17 140/74  07/27/16 (!) 152/88   Good control

## 2017-02-11 ENCOUNTER — Encounter: Payer: Self-pay | Admitting: Internal Medicine

## 2017-02-11 ENCOUNTER — Ambulatory Visit (INDEPENDENT_AMBULATORY_CARE_PROVIDER_SITE_OTHER): Payer: Medicare Other | Admitting: Internal Medicine

## 2017-02-11 VITALS — BP 114/70 | HR 93 | Temp 97.7°F | Wt 275.0 lb

## 2017-02-11 DIAGNOSIS — R350 Frequency of micturition: Secondary | ICD-10-CM

## 2017-02-11 DIAGNOSIS — R1032 Left lower quadrant pain: Secondary | ICD-10-CM | POA: Diagnosis not present

## 2017-02-11 LAB — POC URINALSYSI DIPSTICK (AUTOMATED)
Bilirubin, UA: NEGATIVE
Blood, UA: NEGATIVE
GLUCOSE UA: NEGATIVE
KETONES UA: NEGATIVE
LEUKOCYTES UA: NEGATIVE
Nitrite, UA: NEGATIVE
PROTEIN UA: NEGATIVE
Spec Grav, UA: >=1.03 — AB (ref 1.010–1.025)
Urobilinogen, UA: 0.2 E.U./dL
pH, UA: 6 (ref 5.0–8.0)

## 2017-02-11 NOTE — Assessment & Plan Note (Signed)
Pain seems isolated to abdominal pannus Urinalysis is benign Still eating okay--though less Nothing to suggest kidney stone or diverticulitis If ongoing pain, would consider CT scan

## 2017-02-11 NOTE — Progress Notes (Signed)
Subjective:    Patient ID: Chelsea Keller, female    DOB: 08/03/1934, 82 y.o.   MRN: [id number]  HPI Here with daughter due to urinary problems and LLQ abdominal discomfort  Started 2 days ago--out of the blue-- got pain along left flank (actually lateral groin) Has continued since then "grabbing constant pain" Bowels fairly regular--but not as much as in the past  Ongoing urgency-- "my brain is telling me I have to go but I don't have to" Did set up appt with Dr Zigmund Daniel No dysuria   Current Outpatient Prescriptions on File Prior to Visit  Medication Sig Dispense Refill  . acetaminophen (TYLENOL) 500 MG tablet Take 1,000 mg by mouth at bedtime as needed for mild pain or moderate pain.     . furosemide (LASIX) 40 MG tablet Take 1 tablet (40 mg total) by mouth daily. 90 tablet 3  . ibuprofen (ADVIL,MOTRIN) 200 MG tablet Take 400 mg by mouth every 6 (six) hours as needed for mild pain.    . potassium chloride SA (K-DUR,KLOR-CON) 20 MEQ tablet Take 20 mEq by mouth 2 (two) times daily.    Marland Kitchen triamterene-hydrochlorothiazide (MAXZIDE-25) 37.5-25 MG tablet Take 1 tablet by mouth daily. 90 tablet 3  . vitamin B-12 (CYANOCOBALAMIN) 1000 MCG tablet Take 1,000 mcg by mouth daily.     No current facility-administered medications on file prior to visit.     Allergies  Allergen Reactions  . Codeine Sulfate Shortness Of Breath  . Celecoxib Other (See Comments)    Pt bled out  . Contrast Media [Iodinated Diagnostic Agents] Other (See Comments)    "Went numb, nausea and vomiting"  Recently had contrast and had no problems, stated the first time she had it caused N/V but she has had it since and was fine   . Erythromycin Base Nausea And Vomiting    Past Medical History:  Diagnosis Date  . Allergy   . Anemia   . Anxiety   . Arthritis   . Chronic diastolic CHF (congestive heart failure) (HCC)    a. Echo 1/16:  mild LVH, EF normal, grade 1 DD, MAC  . Chronic venous insufficiency    chronic LE edema  . Depression   . Fibromyalgia    constant pain  . Hx of cardiac catheterization    a. LHC in [place] "ok" per patient with mild plaque in a single vessel - records not available  . Hx of cardiovascular stress test    a. Nuclear study in 2008 normal  . Hx of colonic polyps   . Hypertension   . Hypertriglyceridemia   . Impaired fasting glucose   . PONV (postoperative nausea and vomiting)   . PUD (peptic ulcer disease)    hx of gastric ulcer  . Vitamin B12 deficiency     Past Surgical History:  Procedure Laterality Date  . ABDOMINAL HYSTERECTOMY    . CATARACT EXTRACTION  10/2003   OD  . CHOLECYSTECTOMY    . COLONOSCOPY W/ POLYPECTOMY    . COLONOSCOPY WITH PROPOFOL N/A 10/15/2014   Procedure: COLONOSCOPY WITH PROPOFOL;  Surgeon: Ladene Artist, MD;  Location: [facility];  Service: Endoscopy;  Laterality: N/A;  . ESOPHAGOGASTRODUODENOSCOPY (EGD) WITH PROPOFOL N/A 10/15/2014   Procedure: ESOPHAGOGASTRODUODENOSCOPY (EGD) WITH PROPOFOL;  Surgeon: Ladene Artist, MD;  Location: [facility];  Service: Endoscopy;  Laterality: N/A;  . EXTERNAL FIXATION ANKLE FRACTURE     Fx.  left ankle-fixation with pins later removed sec to infection 1985  .  EXTERNAL FIXATION WRIST FRACTURE  1985   left with pins  . EYE SURGERY    . FEMUR FRACTURE SURGERY  06/2009  . FRACTURE SURGERY    . HARDWARE REMOVAL Left 10/05/2013   Procedure: HARDWARE REMOVAL LEFT DISTAL FEMUR;  Surgeon: Rozanna Box, MD;  Location: [facility];  Service: Orthopedics;  Laterality: Left;  . HEMIARTHROPLASTY SHOULDER FRACTURE  06/2009  . JOINT REPLACEMENT    . STERIOD INJECTION Right 10/05/2013   Procedure: STEROID INJECTION;  Surgeon: Rozanna Box, MD;  Location: [facility];  Service: Orthopedics;  Laterality: Right;  . TONSILLECTOMY    . TONSILLECTOMY    . TOTAL KNEE ARTHROPLASTY  03/09   left    Family History  Problem Relation Age of Onset  . Depression Mother   . Cancer Mother        uterine cancer  .  Heart attack Father   . Cancer Brother        prostate cancer  . Diabetes Maternal Aunt   . Arthritis Brother   . Asthma Brother   . Stroke Maternal Grandmother     Social History   Social History  . Marital status: Widowed    Spouse name: N/A  . Number of children: 4  . Years of education: N/A   Occupational History  . 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 Smoking status: Former Smoker    Packs/day: 1.00    Years: 40.00    Types: Cigarettes    Quit date: 07/06/1993  . Smokeless tobacco: Never Used  . Alcohol use No  . Drug use: No  . Sexual activity: Not on file   Other Topics Concern  . Not on file   Social History Narrative   No living will   Plans to do health care POA forms---wants daughter [name]   Would accept resuscitation attempts---no prolonged ventilation   Absolutely no feeding tube   Review of Systems Taking the furosemide--lost 7# since last visit Some nausea but no vomiting Decreased appetite---but usually only one meal a day plus snacks     Objective:   Physical Exam  Abdominal: Soft. She exhibits no distension. There is no rebound and no guarding.  Tenderness within left abdominal pannus No apparent hernia No masses  Musculoskeletal:  No CVA tenderness          Assessment & Plan:

## 2017-02-11 NOTE — Patient Instructions (Signed)
Let me know next week if you are still having the pain.

## 2017-04-02 ENCOUNTER — Inpatient Hospital Stay (HOSPITAL_COMMUNITY)
Admission: EM | Admit: 2017-04-02 | Discharge: 2017-04-05 | DRG: 683 | Disposition: A | Discharge status: Home / Self Care | Payer: Medicare Other | Attending: Family Medicine | Admitting: Family Medicine

## 2017-04-02 ENCOUNTER — Encounter (HOSPITAL_COMMUNITY): Payer: Self-pay | Admitting: Emergency Medicine

## 2017-04-02 DIAGNOSIS — Z881 Allergy status to other antibiotic agents status: Secondary | ICD-10-CM

## 2017-04-02 DIAGNOSIS — R52 Pain, unspecified: Secondary | ICD-10-CM | POA: Diagnosis present

## 2017-04-02 DIAGNOSIS — M5432 Sciatica, left side: Secondary | ICD-10-CM | POA: Diagnosis not present

## 2017-04-02 DIAGNOSIS — I10 Essential (primary) hypertension: Secondary | ICD-10-CM | POA: Diagnosis present

## 2017-04-02 DIAGNOSIS — M797 Fibromyalgia: Secondary | ICD-10-CM | POA: Diagnosis present

## 2017-04-02 DIAGNOSIS — Z79899 Other long term (current) drug therapy: Secondary | ICD-10-CM

## 2017-04-02 DIAGNOSIS — R42 Dizziness and giddiness: Secondary | ICD-10-CM | POA: Diagnosis present

## 2017-04-02 DIAGNOSIS — D72829 Elevated white blood cell count, unspecified: Secondary | ICD-10-CM | POA: Diagnosis present

## 2017-04-02 DIAGNOSIS — N1831 Chronic kidney disease, stage 3a: Secondary | ICD-10-CM | POA: Diagnosis present

## 2017-04-02 DIAGNOSIS — D519 Vitamin B12 deficiency anemia, unspecified: Secondary | ICD-10-CM | POA: Diagnosis present

## 2017-04-02 DIAGNOSIS — N183 Chronic kidney disease, stage 3 unspecified: Secondary | ICD-10-CM | POA: Diagnosis present

## 2017-04-02 DIAGNOSIS — Z6841 Body Mass Index (BMI) 40.0 and over, adult: Secondary | ICD-10-CM

## 2017-04-02 DIAGNOSIS — Z888 Allergy status to other drugs, medicaments and biological substances status: Secondary | ICD-10-CM

## 2017-04-02 DIAGNOSIS — E669 Obesity, unspecified: Secondary | ICD-10-CM | POA: Diagnosis present

## 2017-04-02 DIAGNOSIS — E876 Hypokalemia: Secondary | ICD-10-CM | POA: Diagnosis present

## 2017-04-02 DIAGNOSIS — M47816 Spondylosis without myelopathy or radiculopathy, lumbar region: Secondary | ICD-10-CM | POA: Diagnosis present

## 2017-04-02 DIAGNOSIS — M5442 Lumbago with sciatica, left side: Secondary | ICD-10-CM | POA: Diagnosis present

## 2017-04-02 DIAGNOSIS — E781 Pure hyperglyceridemia: Secondary | ICD-10-CM | POA: Diagnosis present

## 2017-04-02 DIAGNOSIS — Z96619 Presence of unspecified artificial shoulder joint: Secondary | ICD-10-CM | POA: Diagnosis present

## 2017-04-02 DIAGNOSIS — Z87891 Personal history of nicotine dependence: Secondary | ICD-10-CM

## 2017-04-02 DIAGNOSIS — M48061 Spinal stenosis, lumbar region without neurogenic claudication: Secondary | ICD-10-CM | POA: Diagnosis not present

## 2017-04-02 DIAGNOSIS — I5032 Chronic diastolic (congestive) heart failure: Secondary | ICD-10-CM | POA: Diagnosis present

## 2017-04-02 DIAGNOSIS — E538 Deficiency of other specified B group vitamins: Secondary | ICD-10-CM | POA: Diagnosis present

## 2017-04-02 DIAGNOSIS — E871 Hypo-osmolality and hyponatremia: Secondary | ICD-10-CM | POA: Diagnosis not present

## 2017-04-02 DIAGNOSIS — I130 Hypertensive heart and chronic kidney disease with heart failure and stage 1 through stage 4 chronic kidney disease, or unspecified chronic kidney disease: Secondary | ICD-10-CM | POA: Diagnosis present

## 2017-04-02 DIAGNOSIS — D649 Anemia, unspecified: Secondary | ICD-10-CM | POA: Diagnosis present

## 2017-04-02 DIAGNOSIS — N179 Acute kidney failure, unspecified: Secondary | ICD-10-CM | POA: Diagnosis not present

## 2017-04-02 DIAGNOSIS — Z885 Allergy status to narcotic agent status: Secondary | ICD-10-CM

## 2017-04-02 DIAGNOSIS — M25552 Pain in left hip: Secondary | ICD-10-CM | POA: Diagnosis present

## 2017-04-02 DIAGNOSIS — Z91041 Radiographic dye allergy status: Secondary | ICD-10-CM

## 2017-04-02 DIAGNOSIS — M5489 Other dorsalgia: Secondary | ICD-10-CM | POA: Diagnosis not present

## 2017-04-02 DIAGNOSIS — T380X5A Adverse effect of glucocorticoids and synthetic analogues, initial encounter: Secondary | ICD-10-CM | POA: Diagnosis present

## 2017-04-02 DIAGNOSIS — Z96652 Presence of left artificial knee joint: Secondary | ICD-10-CM | POA: Diagnosis present

## 2017-04-02 DIAGNOSIS — M5416 Radiculopathy, lumbar region: Secondary | ICD-10-CM

## 2017-04-02 DIAGNOSIS — M1612 Unilateral primary osteoarthritis, left hip: Secondary | ICD-10-CM | POA: Diagnosis not present

## 2017-04-02 DIAGNOSIS — D638 Anemia in other chronic diseases classified elsewhere: Secondary | ICD-10-CM | POA: Diagnosis present

## 2017-04-02 DIAGNOSIS — Z90710 Acquired absence of both cervix and uterus: Secondary | ICD-10-CM

## 2017-04-02 DIAGNOSIS — Z8249 Family history of ischemic heart disease and other diseases of the circulatory system: Secondary | ICD-10-CM

## 2017-04-02 DIAGNOSIS — E860 Dehydration: Secondary | ICD-10-CM | POA: Diagnosis present

## 2017-04-02 DIAGNOSIS — R55 Syncope and collapse: Secondary | ICD-10-CM | POA: Diagnosis present

## 2017-04-02 MED ORDER — DEXAMETHASONE SODIUM PHOSPHATE 10 MG/ML IJ SOLN
10.0000 mg | Freq: Once | INTRAMUSCULAR | Status: AC
  Administered 2017-04-03: 10 mg via INTRAVENOUS
  Filled 2017-04-02: qty 1

## 2017-04-02 MED ORDER — ONDANSETRON HCL 4 MG/2ML IJ SOLN
4.0000 mg | Freq: Once | INTRAMUSCULAR | Status: AC
  Administered 2017-04-03: 4 mg via INTRAVENOUS
  Filled 2017-04-02: qty 2

## 2017-04-02 MED ORDER — MORPHINE SULFATE (PF) 4 MG/ML IV SOLN
2.0000 mg | Freq: Once | INTRAVENOUS | Status: AC
  Administered 2017-04-03: 2 mg via INTRAVENOUS
  Filled 2017-04-02: qty 1

## 2017-04-02 NOTE — ED Provider Notes (Signed)
[facility] DEPT Provider Note   CSN: [id number] Arrival date & time: 04/02/17  2255     History   Chief Complaint Chief Complaint  Patient presents with  . Hip Pain    left  . Sciatica    HPI Chelsea Keller is a 82 y.o. female.  Patient presents to the emergency department for evaluation of left lower back, buttocks/hip area pain. She denies injury. She reports that pain began 2 days ago. At first it was mild, but has progressively worsened. Tonight the pain is severe, constant. Pain worsens with movement of the hip, causing her to have trouble bearing weight because of increased pain. She has a slight tingling in the leg, no overt weakness. She has not had any urinary symptoms, change in bowel or bladder function.      Past Medical History:  Diagnosis Date  . Allergy   . Anemia   . Anxiety   . Arthritis   . Chronic diastolic CHF (congestive heart failure) (HCC)    a. Echo 1/16:  mild LVH, EF normal, grade 1 DD, MAC  . Chronic venous insufficiency    chronic LE edema  . Depression   . Fibromyalgia    constant pain  . Hx of cardiac catheterization    a. LHC in [place] "ok" per patient with mild plaque in a single vessel - records not available  . Hx of cardiovascular stress test    a. Nuclear study in 2008 normal  . Hx of colonic polyps   . Hypertension   . Hypertriglyceridemia   . Impaired fasting glucose   . PONV (postoperative nausea and vomiting)   . PUD (peptic ulcer disease)    hx of gastric ulcer  . Vitamin B12 deficiency     Patient Active Problem List   Diagnosis Date Noted  . Acute kidney injury ([place]) 04/03/2017  . Intractable pain-left hip 04/03/2017  . Fibromyalgia 04/03/2017  . Anemia 04/03/2017  . Chronic diastolic CHF (congestive heart failure) ([place]) 04/03/2017  . Hypertension 04/03/2017  . Hypertriglyceridemia 04/03/2017  . Vitamin B12 deficiency 04/03/2017  . LLQ pain 02/11/2017  . Advance directive discussed with patient 01/19/2017  .  Chronic diastolic congestive heart failure ([place]) 05/09/2014  . Mood disorder ([place]) 11/03/2013  . Psoriasis 02/21/2013  . Routine general medical examination at a health care facility 06/14/2012  . BMI 50.0-59.9, adult ([place]) 12/02/2011  . VENTRICULAR HYPERTROPHY, LEFT 10/19/2008  . Sleep disturbance 10/25/2007  . Venous (peripheral) insufficiency 08/11/2007  . Osteoarthritis, multiple sites 08/11/2007  . IMPAIRED FASTING GLUCOSE 08/11/2007  . COLONIC POLYPS, HX OF 05/20/2007  . B12 DEFICIENCY 09/30/2006  . Essential hypertension 09/30/2006  . Allergic rhinitis due to pollen 09/30/2006    Past Surgical History:  Procedure Laterality Date  . ABDOMINAL HYSTERECTOMY    . CATARACT EXTRACTION  10/2003   OD  . CHOLECYSTECTOMY    . COLONOSCOPY W/ POLYPECTOMY    . COLONOSCOPY WITH PROPOFOL N/A 10/15/2014   Procedure: COLONOSCOPY WITH PROPOFOL;  Surgeon: Ladene Artist, MD;  Location: [facility];  Service: Endoscopy;  Laterality: N/A;  . ESOPHAGOGASTRODUODENOSCOPY (EGD) WITH PROPOFOL N/A 10/15/2014   Procedure: ESOPHAGOGASTRODUODENOSCOPY (EGD) WITH PROPOFOL;  Surgeon: Ladene Artist, MD;  Location[facility];  Service: Endoscopy;  Laterality: N/A;  . EXTERNAL FIXATION ANKLE FRACTURE     Fx.  left ankle-fixation with pins later removed sec to infection 1985  . EXTERNAL FIXATION WRIST FRACTURE  1985   left with pins  . EYE SURGERY    .  FEMUR FRACTURE SURGERY  06/2009  . FRACTURE SURGERY    . HARDWARE REMOVAL Left 10/05/2013   Procedure: HARDWARE REMOVAL LEFT DISTAL FEMUR;  Surgeon: Rozanna Box, MD;  Location: [facility];  Service: Orthopedics;  Laterality: Left;  . HEMIARTHROPLASTY SHOULDER FRACTURE  06/2009  . JOINT REPLACEMENT    . STERIOD INJECTION Right 10/05/2013   Procedure: STEROID INJECTION;  Surgeon: Rozanna Box, MD;  Location: [facility];  Service: Orthopedics;  Laterality: Right;  . TONSILLECTOMY    . TONSILLECTOMY    . TOTAL KNEE ARTHROPLASTY  03/09   left    OB History      No data available       Home Medications    Prior to Admission medications   Medication Sig Start Date End Date Taking? Authorizing Provider  acetaminophen (TYLENOL) 500 MG tablet Take 1,000 mg by mouth at bedtime as needed for mild pain or moderate pain.    Yes [provider]  furosemide (LASIX) 40 MG tablet Take 1 tablet (40 mg total) by mouth daily. 01/19/17 04/19/17 Yes Venia Carbon, MD  ibuprofen (ADVIL,MOTRIN) 200 MG tablet Take 400 mg by mouth every 6 (six) hours as needed for mild pain.   Yes [provider]  potassium chloride SA (K-DUR,KLOR-CON) 20 MEQ tablet Take 20 mEq by mouth 2 (two) times daily.   Yes [provider]  triamterene-hydrochlorothiazide (MAXZIDE-25) 37.5-25 MG tablet Take 1 tablet by mouth daily. 07/27/16  Yes Burnell Blanks, MD  vitamin B-12 (CYANOCOBALAMIN) 1000 MCG tablet Take 1,000 mcg by mouth daily.   Yes [provider]    Family History Family History  Problem Relation Age of Onset  . Depression Mother   . Cancer Mother        uterine cancer  . Heart attack Father   . Cancer Brother        prostate cancer  . Diabetes Maternal Aunt   . Arthritis Brother   . Asthma Brother   . Stroke Maternal Grandmother     Social History Social History  Substance Use Topics  . Smoking status: Former Smoker    Packs/day: 1.00    Years: 40.00    Types: Cigarettes    Quit date: 07/06/1993  . Smokeless tobacco: Never Used  . Alcohol use No     Allergies   Codeine sulfate; Celecoxib; Contrast media [iodinated diagnostic agents]; and Erythromycin base   Review of Systems Review of Systems  Musculoskeletal: Positive for back pain.  All other systems reviewed and are negative.    Physical Exam Updated Vital Signs BP 119/62 (BP Location: Right Arm)   Pulse 70   Temp 98 F (36.7 C) (Oral)   Resp 16   Ht _0  (1.575 m)   Wt 119.7 kg (264 lb)   SpO2 97%   BMI 48.29 kg/m   Physical Exam   Constitutional: She is oriented to person, place, and time. She appears well-developed and well-nourished. No distress.  HENT:  Head: Normocephalic and atraumatic.  Right Ear: Hearing normal.  Left Ear: Hearing normal.  Nose: Nose normal.  Mouth/Throat: Oropharynx is clear and moist and mucous membranes are normal.  Eyes: Pupils are equal, round, and reactive to light. Conjunctivae and EOM are normal.  Neck: Normal range of motion. Neck supple.  Cardiovascular: Regular rhythm, S1 normal and S2 normal.  Exam reveals no gallop and no friction rub.   No murmur heard. Pulses:      Dorsalis pedis  pulses are 1+ on the right side, and 1+ on the left side.  Pulmonary/Chest: Effort normal and breath sounds normal. No respiratory distress. She exhibits no tenderness.  Abdominal: Soft. Normal appearance and bowel sounds are normal. There is no hepatosplenomegaly. There is no tenderness. There is no rebound, no guarding, no tenderness at McBurney's point and negative Murphy's sign. No hernia.  Musculoskeletal:       Left hip: She exhibits decreased range of motion (due to pain with movement).       Lumbar back: She exhibits tenderness.       Back:  Neurological: She is alert and oriented to person, place, and time. She has normal strength. No cranial nerve deficit or sensory deficit. Coordination normal. GCS eye subscore is 4. GCS verbal subscore is 5. GCS motor subscore is 6.  Skin: Skin is warm, dry and intact. No rash noted. No cyanosis.  Psychiatric: She has a normal mood and affect. Her speech is normal and behavior is normal. Thought content normal.  Nursing note and vitals reviewed.    ED Treatments / Results  Labs (all labs ordered are listed, but only abnormal results are displayed) Labs Reviewed  CBC - Abnormal; Notable for the following:       Result Value   WBC 11.6 (*)    Hemoglobin 11.0 (*)    HCT 34.9 (*)    RDW 16.5 (*)    All other components within normal limits  BASIC  METABOLIC PANEL - Abnormal; Notable for the following:    Sodium 134 (*)    Potassium 2.9 (*)    Chloride 97 (*)    Glucose, Bld 131 (*)    BUN 31 (*)    Creatinine, Ser 1.42 (*)    Calcium 8.5 (*)    GFR calc non Af Amer 34 (*)    GFR calc Af Amer 39 (*)    All other components within normal limits    EKG  EKG Interpretation None       Radiology Dg Lumbar Spine Complete  Result Date: 04/03/2017 CLINICAL DATA:  Left buttock pain radiating down left leg. EXAM: LUMBAR SPINE - COMPLETE 4+ VIEW COMPARISON:  None FINDINGS: There is normal lumbar lordosis with normal lumbar segmentation. Mild physiologic wedging of the T12 and L1 vertebral bodies at the thoracolumbar junction. Facet joint space narrowing, hypertrophy and sclerosis is identified from L3 through S1 with moderate disc space narrowing at L4-5 and L5-S1. No pars defects. No acute appearing fracture. Disc pathology would be better assessed with MRI. Atherosclerosis of the overlying abdominal aorta and iliac vessels without aneurysm. Osteoarthritic sclerosis about both SI joints. Numerous surgical clips are seen right upper quadrant of the abdomen. IMPRESSION: 1. Lumbar spondylosis with moderate disc space narrowing at L4-5 and L5-S1 and associated facet hypertrophy and sclerosis consistent with facet arthropathy from L3 through S1. 2. No acute lumbar fracture or bone destruction. 3. Osteoarthritis of both SI joints. Electronically Signed   By: Ashley Royalty M.D.   On: 04/03/2017 02:18   Dg Hip Unilat W Or Wo Pelvis 2-3 Views Left  Result Date: 04/03/2017 CLINICAL DATA:  Awoke with left hip pain. EXAM: DG HIP (WITH OR WITHOUT PELVIS) 2-3V LEFT COMPARISON:  None. FINDINGS: There is no evidence of hip fracture or dislocation. Minimal acetabular spurring is normal for age. There is no evidence of hip arthropathy or other focal bone abnormality. Sclerosis about bilateral sacroiliac joints can be seen with sacroiliitis. IMPRESSION: Mild for  age left hip osteoarthritis. Bilateral sacroiliac joints scleroses may represent sacroiliitis. No evidence of acute osseous abnormality. Electronically Signed   By: Jeb Levering M.D.   On: 04/03/2017 02:16    Procedures Procedures (including critical care time)  Medications Ordered in ED Medications  0.9 %  sodium chloride infusion (not administered)  methylPREDNISolone sodium succinate (SOLU-MEDROL) 40 mg/mL injection 40 mg (not administered)  morphine 4 MG/ML injection 1 mg (not administered)  traMADol (ULTRAM) tablet 50 mg (not administered)  potassium chloride SA (K-DUR,KLOR-CON) CR tablet 40 mEq (not administered)  ondansetron (ZOFRAN) injection 4 mg (4 mg Intravenous Given 04/03/17 0012)  morphine 4 MG/ML injection 2 mg (2 mg Intravenous Given 04/03/17 0012)  dexamethasone (DECADRON) injection 10 mg (10 mg Intravenous Given 04/03/17 0012)  oxyCODONE-acetaminophen (PERCOCET/ROXICET) 5-325 MG per tablet 0.5 tablet (0.5 tablets Oral Given 04/03/17 0249)     Initial Impression / Assessment and Plan / ED Course  I have reviewed the triage vital signs and the nursing notes.  Pertinent labs & imaging results that were available during my care of the patient were reviewed by me and considered in my medical decision making (see chart for details).     Patient presents to the emergency department for evaluation of left-sided low back pain into the left posterior hip and buttock area. Pain started several days ago and has slowly and progressively worsened. There was no injury. Patient unable to ambulate now because of severe pain with moving the left leg. She does not have any decreased strength or sensation in the leg. She does have a history of urinary incontinence the last year, no change. No saddle anesthesia.  Final Clinical Impressions(s) / ED Diagnoses   Final diagnoses:  Left sided sciatica    New Prescriptions New Prescriptions   No medications on file     Orpah Greek, MD 04/03/17 0730

## 2017-04-02 NOTE — ED Notes (Signed)
Nurse starting IV and drawing labs. 

## 2017-04-02 NOTE — ED Triage Notes (Signed)
Per EMS, pt from home. Pt woke up Weds w/ left buttock pain going down left leg. Pt reports similar pain in past that resolved with ibuprofen. Pt reports today it became worse and was non-ambulatory. Pt reports taking nabumetone at 1800, 400 ibuprofen and tylenol at 2000. Hx of CHF.

## 2017-04-03 ENCOUNTER — Observation Stay (HOSPITAL_COMMUNITY): Payer: Medicare Other

## 2017-04-03 ENCOUNTER — Emergency Department (HOSPITAL_COMMUNITY): Payer: Medicare Other

## 2017-04-03 DIAGNOSIS — M5416 Radiculopathy, lumbar region: Secondary | ICD-10-CM

## 2017-04-03 DIAGNOSIS — R52 Pain, unspecified: Secondary | ICD-10-CM | POA: Diagnosis present

## 2017-04-03 DIAGNOSIS — N179 Acute kidney failure, unspecified: Secondary | ICD-10-CM | POA: Diagnosis present

## 2017-04-03 DIAGNOSIS — I10 Essential (primary) hypertension: Secondary | ICD-10-CM | POA: Diagnosis not present

## 2017-04-03 DIAGNOSIS — I5032 Chronic diastolic (congestive) heart failure: Secondary | ICD-10-CM | POA: Diagnosis not present

## 2017-04-03 DIAGNOSIS — M5432 Sciatica, left side: Secondary | ICD-10-CM

## 2017-04-03 DIAGNOSIS — E538 Deficiency of other specified B group vitamins: Secondary | ICD-10-CM | POA: Diagnosis present

## 2017-04-03 DIAGNOSIS — M47816 Spondylosis without myelopathy or radiculopathy, lumbar region: Secondary | ICD-10-CM | POA: Diagnosis not present

## 2017-04-03 DIAGNOSIS — M1612 Unilateral primary osteoarthritis, left hip: Secondary | ICD-10-CM | POA: Diagnosis not present

## 2017-04-03 DIAGNOSIS — M48061 Spinal stenosis, lumbar region without neurogenic claudication: Secondary | ICD-10-CM | POA: Diagnosis not present

## 2017-04-03 DIAGNOSIS — N1831 Chronic kidney disease, stage 3a: Secondary | ICD-10-CM | POA: Diagnosis present

## 2017-04-03 DIAGNOSIS — E876 Hypokalemia: Secondary | ICD-10-CM | POA: Diagnosis not present

## 2017-04-03 DIAGNOSIS — N183 Chronic kidney disease, stage 3 unspecified: Secondary | ICD-10-CM | POA: Diagnosis present

## 2017-04-03 DIAGNOSIS — M797 Fibromyalgia: Secondary | ICD-10-CM | POA: Diagnosis present

## 2017-04-03 DIAGNOSIS — E781 Pure hyperglyceridemia: Secondary | ICD-10-CM | POA: Diagnosis present

## 2017-04-03 LAB — BASIC METABOLIC PANEL
ANION GAP: 11 (ref 5–15)
BUN: 31 mg/dL — ABNORMAL HIGH (ref 6–20)
CHLORIDE: 97 mmol/L — AB (ref 101–111)
CO2: 26 mmol/L (ref 22–32)
Calcium: 8.5 mg/dL — ABNORMAL LOW (ref 8.9–10.3)
Creatinine, Ser: 1.42 mg/dL — ABNORMAL HIGH (ref 0.44–1.00)
GFR calc Af Amer: 39 mL/min — ABNORMAL LOW (ref >60)
GFR calc non Af Amer: 34 mL/min — ABNORMAL LOW (ref >60)
GLUCOSE: 131 mg/dL — AB (ref 65–99)
Potassium: 2.9 mmol/L — ABNORMAL LOW (ref 3.5–5.1)
Sodium: 134 mmol/L — ABNORMAL LOW (ref 135–145)

## 2017-04-03 LAB — URINALYSIS, ROUTINE W REFLEX MICROSCOPIC
Bacteria, UA: NONE SEEN
Bilirubin Urine: NEGATIVE
GLUCOSE, UA: NEGATIVE mg/dL
HGB URINE DIPSTICK: NEGATIVE
KETONES UR: NEGATIVE mg/dL
Nitrite: NEGATIVE
PROTEIN: NEGATIVE mg/dL
Specific Gravity, Urine: 1.017 (ref 1.005–1.030)
pH: 5 (ref 5.0–8.0)

## 2017-04-03 LAB — CBC
HEMATOCRIT: 34.9 % — AB (ref 36.0–46.0)
HEMOGLOBIN: 11 g/dL — AB (ref 12.0–15.0)
MCH: 26.8 pg (ref 26.0–34.0)
MCHC: 31.5 g/dL (ref 30.0–36.0)
MCV: 84.9 fL (ref 78.0–100.0)
Platelets: 341 10*3/uL (ref 150–400)
RBC: 4.11 MIL/uL (ref 3.87–5.11)
RDW: 16.5 % — ABNORMAL HIGH (ref 11.5–15.5)
WBC: 11.6 10*3/uL — ABNORMAL HIGH (ref 4.0–10.5)

## 2017-04-03 LAB — MAGNESIUM: MAGNESIUM: 2.1 mg/dL (ref 1.7–2.4)

## 2017-04-03 MED ORDER — ACETAMINOPHEN 325 MG PO TABS: 650.0000 mg | ORAL_TABLET | Freq: Four times a day (QID) | ORAL | Status: DC | PRN

## 2017-04-03 MED ORDER — POTASSIUM CHLORIDE CRYS ER 20 MEQ PO TBCR
40.0000 meq | EXTENDED_RELEASE_TABLET | Freq: Two times a day (BID) | ORAL | Status: AC
  Administered 2017-04-03 (×2): 40 meq via ORAL
  Filled 2017-04-03 (×2): qty 2

## 2017-04-03 MED ORDER — MORPHINE SULFATE (PF) 4 MG/ML IV SOLN: 1.0000 mg | INTRAVENOUS | Status: DC | PRN

## 2017-04-03 MED ORDER — SODIUM CHLORIDE 0.9 % IV SOLN
INTRAVENOUS | Status: DC
  Administered 2017-04-03: 22:00:00 via INTRAVENOUS
  Administered 2017-04-03: 100 mL/h via INTRAVENOUS
  Administered 2017-04-04: 09:00:00 via INTRAVENOUS

## 2017-04-03 MED ORDER — OXYCODONE-ACETAMINOPHEN 5-325 MG PO TABS
0.5000 | ORAL_TABLET | Freq: Once | ORAL | Status: AC
Start: 2017-04-03 — End: 2017-04-03
  Administered 2017-04-03: 0.5 via ORAL
  Filled 2017-04-03: qty 1

## 2017-04-03 MED ORDER — OXYCODONE-ACETAMINOPHEN 5-325 MG PO TABS
0.5000 | ORAL_TABLET | ORAL | Status: DC | PRN
  Administered 2017-04-03 – 2017-04-04 (×3): 1 via ORAL
  Filled 2017-04-03 (×3): qty 1

## 2017-04-03 MED ORDER — ONDANSETRON HCL 4 MG/2ML IJ SOLN: 4.0000 mg | Freq: Four times a day (QID) | INTRAMUSCULAR | Status: DC | PRN

## 2017-04-03 MED ORDER — ENOXAPARIN SODIUM 40 MG/0.4ML ~~LOC~~ SOLN
40.0000 mg | SUBCUTANEOUS | Status: DC
  Administered 2017-04-03: 40 mg via SUBCUTANEOUS
  Filled 2017-04-03 (×2): qty 0.4

## 2017-04-03 MED ORDER — POTASSIUM CHLORIDE CRYS ER 20 MEQ PO TBCR
20.0000 meq | EXTENDED_RELEASE_TABLET | Freq: Two times a day (BID) | ORAL | Status: DC
  Administered 2017-04-04 – 2017-04-05 (×3): 20 meq via ORAL
  Filled 2017-04-03 (×3): qty 1

## 2017-04-03 MED ORDER — ONDANSETRON HCL 4 MG PO TABS: 4.0000 mg | ORAL_TABLET | Freq: Four times a day (QID) | ORAL | Status: DC | PRN

## 2017-04-03 MED ORDER — TRAMADOL HCL 50 MG PO TABS: 50.0000 mg | ORAL_TABLET | Freq: Four times a day (QID) | ORAL | Status: DC | PRN

## 2017-04-03 MED ORDER — ACETAMINOPHEN 650 MG RE SUPP: 650.0000 mg | Freq: Four times a day (QID) | RECTAL | Status: DC | PRN

## 2017-04-03 MED ORDER — METHYLPREDNISOLONE SODIUM SUCC 40 MG IJ SOLR
40.0000 mg | Freq: Two times a day (BID) | INTRAMUSCULAR | Status: DC
  Administered 2017-04-03 – 2017-04-05 (×5): 40 mg via INTRAVENOUS
  Filled 2017-04-03 (×5): qty 1

## 2017-04-03 NOTE — ED Notes (Signed)
Hospitalist at bedside 

## 2017-04-03 NOTE — ED Notes (Signed)
Pt requesting additional pain meds.

## 2017-04-03 NOTE — ED Notes (Signed)
Pt to xray via stretcher

## 2017-04-03 NOTE — Progress Notes (Signed)
Attempted to get report on pt. Number left for report to be called to this RN.

## 2017-04-03 NOTE — Progress Notes (Signed)
Received report on pt.

## 2017-04-03 NOTE — Progress Notes (Signed)
Chelsea Keller is a 82 y.o. female patient admitted from ED awake, alert - oriented  X 4 - no acute distress noted.  VSS - Blood pressure 135/64, pulse 70, temperature 97.7 F (36.5 C), temperature source Oral, resp. rate 16, height 5\' 2"  (1.575 m), weight 119.7 kg (264 lb), SpO2 94 %.    IV in place, occlusive dsg intact without redness.  Orientation to room, and floor completed with information packet given to patient/family.  Patient declined safety video at this time.  Admission INP armband ID verified with patient/family, and in place.   SR up x 2, fall assessment complete, with patient and family able to verbalize understanding of risk associated with falls, and verbalized understanding to call nsg before up out of bed.  Call light within reach, patient able to voice, and demonstrate understanding.  Skin, clean-dry- intact. Pt has MASD on groin, buttocks, and under abd folds. Pt also has psoriasis on bilateral lower legs. Skin is without any other evidence of bruising, or skin tears.   No evidence of skin break down noted on exam.     Will cont to eval and treat per MD orders.  Celine Ahr, RN 04/03/2017 9:21 AM

## 2017-04-03 NOTE — H&P (Signed)
History and Physical    Chelsea Keller OFB:[id number] DOB: 05/25/1935 DOA: 04/02/2017   PCP: Venia Carbon, MD   Attending physician: Memon  Patient coming from/Resides with: Private residence  Chief Complaint: Severe left hip/low back pain with inability to walk  HPI: Chelsea Keller is a 82 y.o. female with medical history significant for hypertension, B12 anemia, chronic diastolic heart failure, chronic kidney disease stage III hypertriglyceridemia fibromyalgia. Patient reports abrupt onset of above-stated pain that awakened her from sleep. She attempted to ambulate but was unable. In the past she has had similar pain that was successfully treated with NSAIDs. She has remote history of sciatica and reports the symptoms are similar. In the ER plain x-rays reveal no definitive etiology to her pain although it was discovered she had significant lumbar osteoarthritis. She had mild hyponatremia w/ hypokalemia (K+ 2.9) and an acute kidney injury. She has been given multiple pain medications and IV Decadron but was unable to ambulate safely in the ER so she will be placed in observation status for acute kidney injury and intractable pain.  ED Course:  Vital Signs: BP (!) 110/54   Pulse 68   Temp 98 F (36.7 C) (Oral)   Resp 16   Ht _0  (1.575 m)   Wt 119.7 kg (264 lb)   SpO2 99%   BMI 48.29 kg/m  DG lumbar spine: Lumbar spondylosis with moderate disc space narrowing at L4-5 and L5-S1 consistent with facet arthropathy; no acute lumbar fracture, bone destruction. Noted osteoarthritis both SI joints. DG left hip: Osteoarthritis in bilateral SI joint sclerosis possibly consistent with sacroiliitis Lab data: Sodium 134, potassium 2.9, chloride 97, CO2 26, glucose 131, BUN 81, creatinine 1.42, anion gap 11, white count 11,600, hemoglobin 11, platelets 341,000 Medications and treatments: Zofran 4 mg IV 1, morphine 2 mg IV 1, Decadron 10 mg IV 1, Percocet 5-3 25 one half tablet 1  Review of  Systems:  In addition to the HPI above,  No Fever-chills, myalgias or other constitutional symptoms No Headache, changes with Vision or hearing, new weakness, tingling, numbness in any extremity, dysarthria or word finding difficulty, tremors or seizure activity No problems swallowing food or Liquids, indigestion/reflux, choking or coughing while eating, abdominal pain with or after eating No Chest pain, Cough or Shortness of Breath, palpitations, orthopnea or DOE No Abdominal pain, N/V, melena,hematochezia, dark tarry stools, constipation No dysuria, malodorous urine, hematuria or flank pain No new skin rashes, lesions, masses or bruises, No new joint pains, aches, swelling or redness No recent unintentional weight gain or loss No polyuria, polydypsia or polyphagia   Past Medical History:  Diagnosis Date  . Allergy   . Anemia   . Anxiety   . Arthritis   . Chronic diastolic CHF (congestive heart failure) (HCC)    a. Echo 1/16:  mild LVH, EF normal, grade 1 DD, MAC  . Chronic venous insufficiency    chronic LE edema  . Depression   . Fibromyalgia    constant pain  . Hx of cardiac catheterization    a. [facility] in [place] "ok" per patient with mild plaque in a single vessel - records not available  . Hx of cardiovascular stress test    a. Nuclear study in 2008 normal  . Hx of colonic polyps   . Hypertension   . Hypertriglyceridemia   . Impaired fasting glucose   . PONV (postoperative nausea and vomiting)   . PUD (peptic ulcer disease)    hx of gastric  ulcer  . Vitamin B12 deficiency     Past Surgical History:  Procedure Laterality Date  . ABDOMINAL HYSTERECTOMY    . CATARACT EXTRACTION  10/2003   OD  . CHOLECYSTECTOMY    . COLONOSCOPY W/ POLYPECTOMY    . COLONOSCOPY WITH PROPOFOL N/A 10/15/2014   Procedure: COLONOSCOPY WITH PROPOFOL;  Surgeon: Ladene Artist, MD;  Location: [facility];  Service: Endoscopy;  Laterality: N/A;  . ESOPHAGOGASTRODUODENOSCOPY (EGD) WITH PROPOFOL N/A  10/15/2014   Procedure: ESOPHAGOGASTRODUODENOSCOPY (EGD) WITH PROPOFOL;  Surgeon: Ladene Artist, MD;  Location: [facility];  Service: Endoscopy;  Laterality: N/A;  . EXTERNAL FIXATION ANKLE FRACTURE     Fx.  left ankle-fixation with pins later removed sec to infection 1985  . EXTERNAL FIXATION WRIST FRACTURE  1985   left with pins  . EYE SURGERY    . FEMUR FRACTURE SURGERY  06/2009  . FRACTURE SURGERY    . HARDWARE REMOVAL Left 10/05/2013   Procedure: HARDWARE REMOVAL LEFT DISTAL FEMUR;  Surgeon: Rozanna Box, MD;  Location: [facility];  Service: Orthopedics;  Laterality: Left;  . HEMIARTHROPLASTY SHOULDER FRACTURE  06/2009  . JOINT REPLACEMENT    . STERIOD INJECTION Right 10/05/2013   Procedure: STEROID INJECTION;  Surgeon: Rozanna Box, MD;  Location: [facility];  Service: Orthopedics;  Laterality: Right;  . TONSILLECTOMY    . TONSILLECTOMY    . TOTAL KNEE ARTHROPLASTY  03/09   left    Social History   Social History  . Marital status: Widowed    Spouse name: N/A  . Number of children: 4  . Years of education: N/A   Occupational History  . 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 Smoking status: Former Smoker    Packs/day: 1.00    Years: 40.00    Types: Cigarettes    Quit date: 07/06/1993  . Smokeless tobacco: Never Used  . Alcohol use No  . Drug use: No  . Sexual activity: Not on file   Other Topics Concern  . Not on file   Social History Narrative   No living will   Plans to do health care POA forms---wants daughter [name]   Would accept resuscitation attempts---no prolonged ventilation   Absolutely no feeding tube    Mobility: RW/Cane Work history: Not obtained   Allergies  Allergen Reactions  . Codeine Sulfate Shortness Of Breath  . Celecoxib Other (See Comments)    Pt bled out  . Contrast Media [Iodinated Diagnostic Agents] Other (See Comments)    "Went numb, nausea and vomiting"  Recently had  contrast and had no problems, stated the first time she had it caused N/V but she has had it since and was fine   . Erythromycin Base Nausea And Vomiting    Family History  Problem Relation Age of Onset  . Depression Mother   . Cancer Mother        uterine cancer  . Heart attack Father   . Cancer Brother        prostate cancer  . Diabetes Maternal Aunt   . Arthritis Brother   . Asthma Brother   . Stroke Maternal Grandmother      Prior to Admission medications   Medication Sig Start Date End Date Taking? Authorizing Provider  acetaminophen (TYLENOL) 500 MG tablet Take 1,000 mg by mouth at bedtime as needed for mild pain or moderate pain.  Yes [provider]  furosemide (LASIX) 40 MG tablet Take 1 tablet (40 mg total) by mouth daily. 01/19/17 04/19/17 Yes Venia Carbon, MD  ibuprofen (ADVIL,MOTRIN) 200 MG tablet Take 400 mg by mouth every 6 (six) hours as needed for mild pain.   Yes [provider]  potassium chloride SA (K-DUR,KLOR-CON) 20 MEQ tablet Take 20 mEq by mouth 2 (two) times daily.   Yes [provider]  triamterene-hydrochlorothiazide (MAXZIDE-25) 37.5-25 MG tablet Take 1 tablet by mouth daily. 07/27/16  Yes Burnell Blanks, MD  vitamin B-12 (CYANOCOBALAMIN) 1000 MCG tablet Take 1,000 mcg by mouth daily.   Yes [provider]    Physical Exam: Vitals:   04/03/17 0600 04/03/17 0630 04/03/17 0723 04/03/17 0730  BP: 126/61 120/66 119/62 (!) 110/54  Pulse: 76 73 70 68  Resp: _0 Temp:      TempSrc:      SpO2: 100% 94% 97% 99%  Weight:      Height:          Constitutional: NAD, calm, uncomfortable Eyes: PERRL, lids and conjunctivae normal ENMT: Mucous membranes are moist. Posterior pharynx clear of any exudate or lesions.Normal dentition.  Neck: normal, supple, no masses, no thyromegaly Respiratory: clear to auscultation bilaterally, no wheezing, no crackles. Normal respiratory effort. No accessory muscle  use.  Cardiovascular: Regular rate and rhythm, no murmurs / rubs / gallops. No extremity edema. 2+ pedal pulses. No carotid bruits.  Abdomen: no tenderness, no masses palpated. No hepatosplenomegaly. Bowel sounds positive.  Musculoskeletal: no clubbing / cyanosis. No joint deformity upper and lower extremities. Good ROM, no contractures. Normal muscle tone.  Skin: no rashes, lesions, ulcers. No induration Neurologic: CN 2-12 grossly intact. Sensation intact, DTR normal. Strength 5/5 upper extremities. 4+/5 lower extremities with deficit directly related to pain response. Positive SLR LLE. Tender to palpation over left lateral hip. Psychiatric: Normal judgment and insight. Alert and oriented x 3. Normal mood.    Labs on Admission: I have personally reviewed following labs and imaging studies  CBC:  Recent Labs Lab 04/02/17 2332  WBC 11.6*  HGB 11.0*  HCT 34.9*  MCV 84.9  PLT 893   Basic Metabolic Panel:  Recent Labs Lab 04/02/17 2332  NA 134*  K 2.9*  CL 97*  CO2 26  GLUCOSE 131*  BUN 31*  CREATININE 1.42*  CALCIUM 8.5*   GFR: Estimated Creatinine Clearance: 38.9 mL/min (A) (by C-G formula based on SCr of 1.42 mg/dL (H)). Liver Function Tests: No results for input(s): AST, ALT, ALKPHOS, BILITOT, PROT, ALBUMIN in the last 168 hours. No results for input(s): LIPASE, AMYLASE in the last 168 hours. No results for input(s): AMMONIA in the last 168 hours. Coagulation Profile: No results for input(s): INR, PROTIME in the last 168 hours. Cardiac Enzymes: No results for input(s): CKTOTAL, CKMB, CKMBINDEX, TROPONINI in the last 168 hours. BNP (last 3 results) No results for input(s): PROBNP in the last 8760 hours. HbA1C: No results for input(s): HGBA1C in the last 72 hours. CBG: No results for input(s): GLUCAP in the last 168 hours. Lipid Profile: No results for input(s): CHOL, HDL, LDLCALC, TRIG, CHOLHDL, LDLDIRECT in the last 72 hours. Thyroid Function Tests: No results  for input(s): TSH, T4TOTAL, FREET4, T3FREE, THYROIDAB in the last 72 hours. Anemia Panel: No results for input(s): VITAMINB12, FOLATE, FERRITIN, TIBC, IRON, RETICCTPCT in the last 72 hours. Urine analysis:    Component Value Date/Time   COLORURINE yellow 01/13/2010 1056  APPEARANCEUR Hazy 01/13/2010 1056   LABSPEC 1.015 01/13/2010 1056   PHURINE 6.5 01/13/2010 1056   GLUCOSEU NEGATIVE 06/08/2009 1720   HGBUR negative 01/13/2010 1056   BILIRUBINUR negative 02/11/2017 1709   KETONESUR NEGATIVE 06/08/2009 1720   PROTEINUR negative 02/11/2017 1709   PROTEINUR NEGATIVE 06/08/2009 1720   UROBILINOGEN 0.2 02/11/2017 1709   UROBILINOGEN 0.2 01/13/2010 1056   NITRITE negative 02/11/2017 1709   NITRITE negative 01/13/2010 1056   LEUKOCYTESUR Negative 02/11/2017 1709   Sepsis Labs: _0 (procalcitonin:4,lacticidven:4) )No results found for this or any previous visit (from the past 240 hour(s)).   Radiological Exams on Admission: Dg Lumbar Spine Complete  Result Date: 04/03/2017 CLINICAL DATA:  Left buttock pain radiating down left leg. EXAM: LUMBAR SPINE - COMPLETE 4+ VIEW COMPARISON:  None FINDINGS: There is normal lumbar lordosis with normal lumbar segmentation. Mild physiologic wedging of the T12 and L1 vertebral bodies at the thoracolumbar junction. Facet joint space narrowing, hypertrophy and sclerosis is identified from L3 through S1 with moderate disc space narrowing at L4-5 and L5-S1. No pars defects. No acute appearing fracture. Disc pathology would be better assessed with MRI. Atherosclerosis of the overlying abdominal aorta and iliac vessels without aneurysm. Osteoarthritic sclerosis about both SI joints. Numerous surgical clips are seen right upper quadrant of the abdomen. IMPRESSION: 1. Lumbar spondylosis with moderate disc space narrowing at L4-5 and L5-S1 and associated facet hypertrophy and sclerosis consistent with facet arthropathy from L3 through S1. 2. No acute lumbar  fracture or bone destruction. 3. Osteoarthritis of both SI joints. Electronically Signed   By: Ashley Royalty M.D.   On: 04/03/2017 02:18   Dg Hip Unilat W Or Wo Pelvis 2-3 Views Left  Result Date: 04/03/2017 CLINICAL DATA:  Awoke with left hip pain. EXAM: DG HIP (WITH OR WITHOUT PELVIS) 2-3V LEFT COMPARISON:  None. FINDINGS: There is no evidence of hip fracture or dislocation. Minimal acetabular spurring is normal for age. There is no evidence of hip arthropathy or other focal bone abnormality. Sclerosis about bilateral sacroiliac joints can be seen with sacroiliitis. IMPRESSION: Mild for age left hip osteoarthritis. Bilateral sacroiliac joints scleroses may represent sacroiliitis. No evidence of acute osseous abnormality. Electronically Signed   By: Jeb Levering M.D.   On: 04/03/2017 02:16    EKG: (Independently reviewed) sinus rhythm with ventricular rate 83 bpm, QTC 465 ms, no acute ischemic changes  Assessment/Plan Principal Problem:   Acute kidney injury on CKD (chronic kidney disease) stage 3, GFR 30-59 ml/min  -Incidental finding of acute kidney injury in setting of utilization of combination thiazide and loop diuretics for hypertensive treatment -Hold offending meds -IV fluids: Normal saline at 100/hr -Follow labs  Active Problems:   Intractable pain-left hip/ Fibromyalgia -Unable to mobilize -Plain films question sacroiliitis-Solu-Medrol 40 mg IV every 12 hours -MR left hip and lumbar spine -Very low-dose morphine IV-patient desatted with only 2 mg in ER -Ultram prn-if ineffective can attempt low-dose Oxy IR -Unable to utilize NSAIDs in setting of AKI -PT/OT evaluation in a.m.    Acute hypokalemia -On potassium replacement with thiazide diuretic at home -Patient reporting leg cramps prior to arrival -Kdur 40 mEq every 12 hours 2 doses and resume home dose      Hypertension /Chronic diastolic CHF (congestive heart failure)  -Patient only on diuretic therapy for blood  pressure management  -She is reluctant to stop diuretics in favor of alternative agents -especially reluctant to utilize beta blockers  -No prior heart failure exacerbation so likely can  follow daily weights at home and utilize Lasix  Prn -Last echo in 2016 -repeat this admission -Patient reporting several months of dizziness with standing and they're syncope which likely supports supposition she is over diuresed     Anemia/ Vitamin B12 deficiency -Hemoglobin stable and at baseline     Hypertriglyceridemia -Not all medications prior to admission        DVT prophylaxis: Lovenox  Code Status: Full  Family Communication:  daughter  Disposition Plan: home  Consults called:  none     Thomasine Klutts L. ANP-BC [facility] Pager [phone]   If 7PM-7AM, please contact night-coverage [email or web address] Password [email or web address]  04/03/2017, 8:04 AM

## 2017-04-04 DIAGNOSIS — E871 Hypo-osmolality and hyponatremia: Secondary | ICD-10-CM | POA: Diagnosis present

## 2017-04-04 DIAGNOSIS — E538 Deficiency of other specified B group vitamins: Secondary | ICD-10-CM | POA: Diagnosis present

## 2017-04-04 DIAGNOSIS — R52 Pain, unspecified: Secondary | ICD-10-CM | POA: Diagnosis present

## 2017-04-04 DIAGNOSIS — N179 Acute kidney failure, unspecified: Secondary | ICD-10-CM | POA: Diagnosis not present

## 2017-04-04 DIAGNOSIS — M25552 Pain in left hip: Secondary | ICD-10-CM | POA: Diagnosis present

## 2017-04-04 DIAGNOSIS — M5432 Sciatica, left side: Secondary | ICD-10-CM | POA: Diagnosis present

## 2017-04-04 DIAGNOSIS — T380X5A Adverse effect of glucocorticoids and synthetic analogues, initial encounter: Secondary | ICD-10-CM | POA: Diagnosis present

## 2017-04-04 DIAGNOSIS — I130 Hypertensive heart and chronic kidney disease with heart failure and stage 1 through stage 4 chronic kidney disease, or unspecified chronic kidney disease: Secondary | ICD-10-CM | POA: Diagnosis present

## 2017-04-04 DIAGNOSIS — Z90710 Acquired absence of both cervix and uterus: Secondary | ICD-10-CM | POA: Diagnosis not present

## 2017-04-04 DIAGNOSIS — D519 Vitamin B12 deficiency anemia, unspecified: Secondary | ICD-10-CM | POA: Diagnosis present

## 2017-04-04 DIAGNOSIS — Z6841 Body Mass Index (BMI) 40.0 and over, adult: Secondary | ICD-10-CM | POA: Diagnosis not present

## 2017-04-04 DIAGNOSIS — N183 Chronic kidney disease, stage 3 (moderate): Secondary | ICD-10-CM | POA: Diagnosis present

## 2017-04-04 DIAGNOSIS — Z87891 Personal history of nicotine dependence: Secondary | ICD-10-CM | POA: Diagnosis not present

## 2017-04-04 DIAGNOSIS — E860 Dehydration: Secondary | ICD-10-CM | POA: Diagnosis present

## 2017-04-04 DIAGNOSIS — I5032 Chronic diastolic (congestive) heart failure: Secondary | ICD-10-CM | POA: Diagnosis present

## 2017-04-04 DIAGNOSIS — M47816 Spondylosis without myelopathy or radiculopathy, lumbar region: Secondary | ICD-10-CM | POA: Diagnosis present

## 2017-04-04 DIAGNOSIS — D638 Anemia in other chronic diseases classified elsewhere: Secondary | ICD-10-CM | POA: Diagnosis present

## 2017-04-04 DIAGNOSIS — Z96652 Presence of left artificial knee joint: Secondary | ICD-10-CM | POA: Diagnosis present

## 2017-04-04 DIAGNOSIS — M5442 Lumbago with sciatica, left side: Secondary | ICD-10-CM | POA: Diagnosis present

## 2017-04-04 DIAGNOSIS — M797 Fibromyalgia: Secondary | ICD-10-CM | POA: Diagnosis present

## 2017-04-04 DIAGNOSIS — E876 Hypokalemia: Secondary | ICD-10-CM | POA: Diagnosis present

## 2017-04-04 DIAGNOSIS — D72829 Elevated white blood cell count, unspecified: Secondary | ICD-10-CM | POA: Diagnosis present

## 2017-04-04 DIAGNOSIS — E669 Obesity, unspecified: Secondary | ICD-10-CM | POA: Diagnosis present

## 2017-04-04 DIAGNOSIS — E781 Pure hyperglyceridemia: Secondary | ICD-10-CM | POA: Diagnosis present

## 2017-04-04 DIAGNOSIS — D649 Anemia, unspecified: Secondary | ICD-10-CM | POA: Diagnosis present

## 2017-04-04 LAB — CBC
HEMATOCRIT: 37.1 % (ref 36.0–46.0)
Hemoglobin: 11.3 g/dL — ABNORMAL LOW (ref 12.0–15.0)
MCH: 26.5 pg (ref 26.0–34.0)
MCHC: 30.5 g/dL (ref 30.0–36.0)
MCV: 87.1 fL (ref 78.0–100.0)
PLATELETS: 371 10*3/uL (ref 150–400)
RBC: 4.26 MIL/uL (ref 3.87–5.11)
RDW: 16.7 % — AB (ref 11.5–15.5)
WBC: 16.7 10*3/uL — AB (ref 4.0–10.5)

## 2017-04-04 LAB — COMPREHENSIVE METABOLIC PANEL
ALBUMIN: 2.8 g/dL — AB (ref 3.5–5.0)
ALK PHOS: 85 U/L (ref 38–126)
ALT: 28 U/L (ref 14–54)
AST: 35 U/L (ref 15–41)
Anion gap: 7 (ref 5–15)
BILIRUBIN TOTAL: 0.6 mg/dL (ref 0.3–1.2)
BUN: 29 mg/dL — AB (ref 6–20)
CALCIUM: 8.6 mg/dL — AB (ref 8.9–10.3)
CO2: 30 mmol/L (ref 22–32)
CREATININE: 1.39 mg/dL — AB (ref 0.44–1.00)
Chloride: 103 mmol/L (ref 101–111)
GFR calc Af Amer: 40 mL/min — ABNORMAL LOW (ref >60)
GFR, EST NON AFRICAN AMERICAN: 35 mL/min — AB (ref >60)
GLUCOSE: 197 mg/dL — AB (ref 65–99)
POTASSIUM: 3.6 mmol/L (ref 3.5–5.1)
SODIUM: 140 mmol/L (ref 135–145)
TOTAL PROTEIN: 5.5 g/dL — AB (ref 6.5–8.1)

## 2017-04-04 NOTE — Evaluation (Signed)
Physical Therapy Evaluation Patient Details Name: Chelsea Keller MRN: [id number] DOB: 07/09/1934 Today's Date: 04/04/2017   History of Present Illness   82 yo pt presents to the hospital with worsening left back/hip pain that radiates into her leg. She has also been feeling weak, dizzy for the last several weeks. She is on diuretics for LE edema. It appears that she is likely dehydrated and has hypokalemia from diuretics.   Clinical Impression  Pt presents at/near baseline functional level, requiring at least close supervision for most mobility activities due to obesity, chronic pain, and severe incontinence.  Reports less pain in legs and able to walk 100 feet with walker. Has help at home, though asserts desire to maintain independence AND asks for scooter to facilitate mobility.  Counseled pt on risks/benefits of scooters.  Pt has 16 steps to access bedroom/bathroom and has been navigating with some difficulty but denies need for physical help or stumbles/falls.  Recommend HHPT for d/c plan. No further acute PT needs.    Follow Up Recommendations Home health PT    Equipment Recommendations  None recommended by PT    Recommendations for Other Services       Precautions / Restrictions Precautions Precautions: Fall Precaution Comments: bed alarm Restrictions Weight Bearing Restrictions: No      Mobility  Bed Mobility Overal bed mobility: Needs Assistance Bed Mobility: Supine to Sit     Supine to sit: HOB elevated;Min assist     General bed mobility comments: needs assist of pad to complete pivot to EOB  Transfers Overall transfer level: Needs assistance Equipment used: Rolling walker (2 wheeled) Transfers: Sit to/from Stand Sit to Stand: Min guard         General transfer comment: sits EOB for several minutes to clear head, then stands with/without support to pull up underpants; sits to chair with assist of sink/armrests  Ambulation/Gait Ambulation/Gait assistance:  Min guard Ambulation Distance (Feet): 100 Feet Assistive device: Rolling walker (2 wheeled) Gait Pattern/deviations: Step-through pattern;Decreased stride length;Trunk flexed;Narrow base of support Gait velocity: very slow Gait velocity interpretation: Below normal speed for age/gender General Gait Details: slow pace, frequent standing rest breaks, leans over RW occasionally to rest back; says legs feel good today, likely due to reduced edema  Stairs            Wheelchair Mobility    Modified Rankin (Stroke Patients Only)       Balance Overall balance assessment: Needs assistance Sitting-balance support: No upper extremity supported;Feet supported Sitting balance-Leahy Scale: Good     Standing balance support: No upper extremity supported;During functional activity Standing balance-Leahy Scale: Fair Standing balance comment: able to pull up underpants without holding RW                             Pertinent Vitals/Pain Pain Assessment: 0-10 Pain Score: 5  Pain Location: right buttock bruise; says legs feel good today Pain Descriptors / Indicators: Sore Pain Intervention(s): Monitored during session;Repositioned    Home Living Family/patient expects to be discharged to:: Private residence Living Arrangements: Children Available Help at Discharge: Family;Available 24 hours/day Type of Home: House Home Access: Level entry     Home Layout: Two level Home Equipment: Bedside commode;Tub bench;Walker - 4 wheels;Cane - single point Additional Comments: does not like to use BSC, says she can't pee/poop on elevated seat    Prior Function Level of Independence: Independent with assistive device(s)           Comments: cane in the home, RW for outside; wants scooter but also strongly desires to maintain independence     Hand Dominance        Extremity/Trunk Assessment   Upper Extremity Assessment Upper Extremity Assessment: Defer to OT evaluation  (right shoulder limited from previous fx)    Lower Extremity Assessment Lower Extremity Assessment: Generalized weakness    Cervical / Trunk Assessment Cervical / Trunk Assessment: Normal  Communication   Communication: No difficulties  Cognition Arousal/Alertness: Awake/alert Behavior During Therapy: WFL for tasks assessed/performed Overall Cognitive Status: Within Functional Limits for tasks assessed                                 General Comments: clear and coherent recounting of health issues      General Comments      Exercises     Assessment/Plan    PT Assessment All further PT needs can be met in the next venue of care  PT Problem List Decreased strength;Decreased range of motion;Decreased activity tolerance;Decreased balance;Decreased mobility;Cardiopulmonary status limiting activity;Obesity;Pain       PT Treatment Interventions      PT Goals (Current goals can be found in the Care Plan section)  Acute Rehab PT Goals Patient Stated Goal: maintain independence PT Goal Formulation: All assessment and education complete, DC therapy    Frequency     Barriers to discharge        Co-evaluation               AM-PAC PT "6 Clicks" Daily Activity  Outcome Measure Difficulty turning over in bed (including adjusting bedclothes, sheets and blankets)?: A Little Difficulty moving from lying on back to sitting on the side of the bed? : A Little Difficulty sitting down on and standing up from a chair with arms (e.g., wheelchair, bedside commode, etc,.)?: A Little Help needed moving to and from a bed to chair (including a wheelchair)?: A Little Help needed walking in hospital room?: A Little Help needed climbing 3-5 steps with a railing? : A Lot 6 Click Score: 17    End of Session Equipment Utilized During Treatment: Gait belt Activity Tolerance: Patient tolerated treatment well Patient left: in chair;with nursing/sitter in room;with call  bell/phone within reach (sitting at sink with NT to wash) Nurse Communication: Mobility status PT Visit Diagnosis: Unsteadiness on feet (R26.81)    Time: 0086-7619 PT Time Calculation (min) (ACUTE ONLY): 43 min   Charges:   PT Evaluation $PT Eval Moderate Complexity: 1 Mod PT Treatments $Gait Training: 8-22 mins $Therapeutic Activity: 8-22 mins   PT G Codes:   PT G-Codes **NOT FOR INPATIENT CLASS** Functional Assessment Tool Used: AM-PAC 6 Clicks Basic Mobility Functional Limitation: Mobility: Walking and moving around Mobility: Walking and Moving Around Current Status (J0932): At least 40 percent but less than 60 percent impaired, limited or restricted Mobility: Walking and Moving Around Goal Status [phone]): At least 20 percent but less than 40 percent impaired, limited or restricted Mobility: Walking and Moving Around Discharge Status [phone]): At least 40 percent but less than 60 percent impaired, limited or restricted    Kearney Hard, PT, DPT, MS Board Certified Geriatric Physical Therapist  Herbie Drape 04/04/2017, 11:29 AM

## 2017-04-04 NOTE — Progress Notes (Signed)
PROGRESS NOTE    Chelsea Keller  WUJ:[id number] DOB: 09-18-1934 DOA: 04/02/2017 PCP: Venia Carbon, MD   Brief Narrative:82 y.o. female with medical history significant for hypertension, B12 anemia, chronic diastolic heart failure, chronic kidney disease stage III hypertriglyceridemia fibromyalgia. Patient reports abrupt onset of above-stated pain that awakened her from sleep. She attempted to ambulate but was unable. In the past she has had similar pain that was successfully treated with NSAIDs. She has remote history of sciatica and reports the symptoms are similar. In the ER plain x-rays reveal no definitive etiology to her pain although it was discovered she had significant lumbar osteoarthritis. She had mild hyponatremia w/ hypokalemia (K+ 2.9) and an acute kidney injury. She has been given multiple pain medications and IV Decadron but was unable to ambulate safely in the ER so she will be placed in observation status for acute kidney injury and intractable pain.  ED Course:  Vital Signs: BP (!) 110/54   Pulse 68   Temp 98 F (36.7 C) (Oral)   Resp 16   Ht 5\' 2"  (1.575 m)   Wt 119.7 kg (264 lb)   SpO2 99%   BMI 48.29 kg/m  DG lumbar spine: Lumbar spondylosis with moderate disc space narrowing at L4-5 and L5-S1 consistent with facet arthropathy; no acute lumbar fracture, bone destruction. Noted osteoarthritis both SI joints. DG left hip: Osteoarthritis in bilateral SI joint sclerosis possibly consistent with sacroiliitis Lab data: Sodium 134, potassium 2.9, chloride 97, CO2 26, glucose 131, BUN 81, creatinine 1.42, anion gap 11, white count 11,600, hemoglobin 11, platelets 341,000 Medications and treatments: Zofran 4 mg IV 1, morphine 2 mg IV 1, Decadron 10 mg IV 1, Percocet 5-3 25 one half tablet 1   Assessment & Plan:   Principal Problem:   Acute kidney injury ([place]) Active Problems:   Intractable pain-left hip   Fibromyalgia   Anemia   Chronic diastolic CHF  (congestive heart failure) (HCC)   Hypertension   Hypertriglyceridemia   Vitamin B12 deficiency   CKD (chronic kidney disease) stage 3, GFR 30-59 ml/min (HCC)   Acute hypokalemia   Left sided sciatica  Back pain secondary to sciatica mri L3 l4 DISC BULGE with DJD.Patient lives at home with family.but she has to climb 16 stairs to get to her bed room.pt/ot consult.plan dc home tomorrow wit home pt and ot. Hypokalemia resolved. CKD better. Leukocytosis secondary to decadron which was started for the back pain. DVT prophylaxis: lovenox Code Status:full Family Communication:none Disposition Plan:home tomorrow.   Consultants: none   Procedures:none  Antimicrobials:  none  Subjective:feels better   Objective: Vitals:   04/03/17 1502 04/03/17 2048 04/04/17 0457 04/04/17 1510  BP: (!) 157/52 (!) 152/58 (!) 122/53 (!) 144/68  Pulse: 83 84 68 75  Resp: 20  18 20   Temp: 97.7 F (36.5 C) 98.1 F (36.7 C) 97.7 F (36.5 C) 98.7 F (37.1 C)  TempSrc: Oral Oral Oral   SpO2: 96% 98% 97% 100%  Weight:   125.1 kg (275 lb 14.4 oz)   Height:        Intake/Output Summary (Last 24 hours) at 04/04/17 1700 Last data filed at 04/04/17 0458  Gross per 24 hour  Intake          1653.34 ml  Output              700 ml  Net           953.34 ml   [employer]  04/02/17 2314 04/03/17 1042 04/04/17 0457  Weight: 119.7 kg (264 lb) 120.4 kg (265 lb 8 oz) 125.1 kg (275 lb 14.4 oz)    Examination:  General exam: Appears calm and comfortable  Respiratory system: Clear to auscultation. Respiratory effort normal. Cardiovascular system: S1 & S2 heard, RRR. No JVD, murmurs, rubs, gallops or clicks. No pedal edema. Gastrointestinal system: Abdomen is nondistended, soft and nontender. No organomegaly or masses felt. Normal bowel sounds heard. Central nervous system: Alert and oriented. No focal neurological deficits. Extremities: Symmetric 5 x 5 power. Skin: No rashes, lesions or  ulcers Psychiatry: Judgement and insight appear normal. Mood & affect appropriate.     Data Reviewed: I have personally reviewed following labs and imaging studies  CBC:  Recent Labs Lab 04/02/17 2332 04/04/17 0414  WBC 11.6* 16.7*  HGB 11.0* 11.3*  HCT 34.9* 37.1  MCV 84.9 87.1  PLT 341 720   Basic Metabolic Panel:  Recent Labs Lab 04/02/17 2332 04/03/17 0955 04/04/17 0414  NA 134*  --  140  K 2.9*  --  3.6  CL 97*  --  103  CO2 26  --  30  GLUCOSE 131*  --  197*  BUN 31*  --  29*  CREATININE 1.42*  --  1.39*  CALCIUM 8.5*  --  8.6*  MG  --  2.1  --    GFR: Estimated Creatinine Clearance: 40.8 mL/min (A) (by C-G formula based on SCr of 1.39 mg/dL (H)). Liver Function Tests:  Recent Labs Lab 04/04/17 0414  AST 35  ALT 28  ALKPHOS 85  BILITOT 0.6  PROT 5.5*  ALBUMIN 2.8*   No results for input(s): LIPASE, AMYLASE in the last 168 hours. No results for input(s): AMMONIA in the last 168 hours. Coagulation Profile: No results for input(s): INR, PROTIME in the last 168 hours. Cardiac Enzymes: No results for input(s): CKTOTAL, CKMB, CKMBINDEX, TROPONINI in the last 168 hours. BNP (last 3 results) No results for input(s): PROBNP in the last 8760 hours. HbA1C: No results for input(s): HGBA1C in the last 72 hours. CBG: No results for input(s): GLUCAP in the last 168 hours. Lipid Profile: No results for input(s): CHOL, HDL, LDLCALC, TRIG, CHOLHDL, LDLDIRECT in the last 72 hours. Thyroid Function Tests: No results for input(s): TSH, T4TOTAL, FREET4, T3FREE, THYROIDAB in the last 72 hours. Anemia Panel: No results for input(s): VITAMINB12, FOLATE, FERRITIN, TIBC, IRON, RETICCTPCT in the last 72 hours. Sepsis Labs: No results for input(s): PROCALCITON, LATICACIDVEN in the last 168 hours.  No results found for this or any previous visit (from the past 240 hour(s)).       Radiology Studies: Dg Lumbar Spine Complete  Result Date: 04/03/2017 CLINICAL  DATA:  Left buttock pain radiating down left leg. EXAM: LUMBAR SPINE - COMPLETE 4+ VIEW COMPARISON:  None FINDINGS: There is normal lumbar lordosis with normal lumbar segmentation. Mild physiologic wedging of the T12 and L1 vertebral bodies at the thoracolumbar junction. Facet joint space narrowing, hypertrophy and sclerosis is identified from L3 through S1 with moderate disc space narrowing at L4-5 and L5-S1. No pars defects. No acute appearing fracture. Disc pathology would be better assessed with MRI. Atherosclerosis of the overlying abdominal aorta and iliac vessels without aneurysm. Osteoarthritic sclerosis about both SI joints. Numerous surgical clips are seen right upper quadrant of the abdomen. IMPRESSION: 1. Lumbar spondylosis with moderate disc space narrowing at L4-5 and L5-S1 and associated facet hypertrophy and sclerosis consistent with facet arthropathy from L3 through  S1. 2. No acute lumbar fracture or bone destruction. 3. Osteoarthritis of both SI joints. Electronically Signed   By: Ashley Royalty M.D.   On: 04/03/2017 02:18   Mr Lumbar Spine Wo Contrast  Result Date: 04/03/2017 CLINICAL DATA:  Initial evaluation for degenerative disc disease. EXAM: MRI LUMBAR SPINE WITHOUT CONTRAST TECHNIQUE: Multiplanar, multisequence MR imaging of the lumbar spine was performed. No intravenous contrast was administered. COMPARISON:  Prior radiograph from earlier the same day. FINDINGS: Segmentation: Normal segmentation. Lowest well-formed disc labeled the L5-S1 level. Alignment: Trace anterolisthesis of L3 on L4 and L4 on L5. Vertebral bodies otherwise normally aligned with preservation of the normal lumbar lordosis. Vertebrae: Vertebral body heights maintained. No evidence for acute or chronic fracture. Bone marrow signal intensity mildly heterogeneous but within normal limits. No worrisome osseous lesions. Small benign hemangioma noted within the L4 vertebral body. No abnormal marrow edema. Conus medullaris:  Extends to the L1-2 level and appears normal. Paraspinal and other soft tissues: Paraspinous soft tissues within normal limits. T2 hyperintense cysts noted within left kidney. Visualized visceral structures otherwise unremarkable. Disc levels: L1-2:  Unremarkable. L2-3: Minimal annular disc bulge with disc desiccation. Mild facet and ligamentum flavum hypertrophy. No significant canal stenosis. Mild left L2 foraminal narrowing without impingement. L3-4: Trace anterolisthesis. Minimal diffuse disc bulge. Advanced facet arthrosis. Resultant moderate canal and bilateral subarticular stenosis. Suspected superimposed broad right extraforaminal/far lateral disc protrusion, contacting the exiting right L3 nerve root without frank impingement (series 7, image 20). Mild bilateral L3 foraminal narrowing, right slightly worse than left. L4-5: Trace anterolisthesis. Diffuse disc bulge with disc desiccation and intervertebral disc space narrowing. Superimposed central disc protrusion indents the ventral thecal sac. Moderate facet arthrosis with probable ankylosis of the L4-5 facets. Resultant mild canal with mild to moderate bilateral subarticular stenosis. No significant foraminal encroachment. L5-S1: Chronic degenerative intervertebral disc space narrowing with disc desiccation and reactive endplate changes. Mild facet hypertrophy. Posterior disc osteophyte minimally flattens the ventral thecal sac without significant stenosis or neural impingement. Foramina remain patent. IMPRESSION: 1. Trace anterolisthesis of L3 on L4 with associated disc bulge and advanced facet arthropathy, resulting in moderate canal and bilateral subarticular stenosis. 2. Broad right extraforaminal disc protrusion at L3-4, closely approximating and potentially irritating the exiting right L3 nerve root. 3. Additional fairly mild degenerative changes for patient age without significant stenosis or evidence for neural impingement. Electronically Signed    By: Jeannine Boga M.D.   On: 04/03/2017 17:27   Mr Hip Left Wo Contrast  Result Date: 04/03/2017 CLINICAL DATA:  Left hip arthritis. EXAM: MR OF THE LEFT HIP WITHOUT CONTRAST TECHNIQUE: Multiplanar, multisequence MR imaging was performed. No intravenous contrast was administered. COMPARISON:  04/03/2017 FINDINGS: Bones: Sclerosis about both SI joints consistent with osteoarthritis or stigmata of chronic sacroiliitis. Minimal endplate spurring is seen of the included lumbar spine consistent with lumbar spondylosis. This is associated with mild moderate disc space narrowing from L2 caudad through S1. Articular cartilage and labrum Articular cartilage: No focal chondral defect. Mild chondral thinning of the acetabular and femoral head cartilage bilaterally with slight joint space narrowing. Labrum: No labral tear is identified given lack of joint distention. Joint or bursal effusion Joint effusion:  No joint effusions. Bursae:  No bursal fluid collections. Muscles and tendons Muscles and tendons: Gluteal muscle strains adjacent to the greater trochanters bilaterally. Other findings Miscellaneous: There is mild-to-moderate subcutaneous edema about the pelvis with trace fluid adjacent to the left SI joint. IMPRESSION: 1. Sclerosis of both SI  joints consistent osteoarthritis or stigmata of chronic sacroiliitis. 2. No acute fracture or bone destruction of the bony pelvis and hips. 3. Gluteal muscle edema consistent with muscle strain adjacent to the greater trochanters bilaterally. 4. Mild soft tissue anasarca. 5. Slight joint space narrowing of both hips with probable mild chondral thinning. No focal chondral defects. Electronically Signed   By: Ashley Royalty M.D.   On: 04/03/2017 19:34   Dg Hip Unilat W Or Wo Pelvis 2-3 Views Left  Result Date: 04/03/2017 CLINICAL DATA:  Awoke with left hip pain. EXAM: DG HIP (WITH OR WITHOUT PELVIS) 2-3V LEFT COMPARISON:  None. FINDINGS: There is no evidence of hip fracture  or dislocation. Minimal acetabular spurring is normal for age. There is no evidence of hip arthropathy or other focal bone abnormality. Sclerosis about bilateral sacroiliac joints can be seen with sacroiliitis. IMPRESSION: Mild for age left hip osteoarthritis. Bilateral sacroiliac joints scleroses may represent sacroiliitis. No evidence of acute osseous abnormality. Electronically Signed   By: Jeb Levering M.D.   On: 04/03/2017 02:16        Scheduled Meds: . methylPREDNISolone (SOLU-MEDROL) injection  40 mg Intravenous BID  . potassium chloride SA  20 mEq Oral BID   Continuous Infusions: . sodium chloride 100 mL/hr at 04/04/17 0914     LOS: 0 days     Georgette Shell, MD [facility]  If 7PM-7AM, please contact night-coverage [email or web address] Password [email or web address] 04/04/2017, 5:00 PM

## 2017-04-04 NOTE — Care Management Note (Signed)
Case Management Note  Patient Details  Name: Chelsea Keller MRN: [id number] Date of Birth: 1935/04/18  Subjective/Objective:       Presents with AKI/ pain, hx of hypertension, B12 anemia, chronic diastolic heart failure, chronic kidney disease stage IIIhypertriglyceridemia fibromyalgia. Resides with daughter, [name] and grandchildren. States independent with ADL's PTA. DME: cane, walkers x 2.  Rhoda Waldvogel (Daughter)     [phone]      PCP:  Viviana Simpler  Action/Plan: Plan is to d/c to home when medically stable. CM to f/u with d/c needs.  Expected Discharge Date:  04/05/2017               Expected Discharge Plan:  [place]  In-House Referral:     Discharge planning Services  CM Consult  Post Acute Care Choice:    Choice offered to:  Patient  DME Arranged:    DME Agency:     HH Arranged:  PT, OT HH Agency:  [place]  Status of Service:  In process, will continue to follow  If discussed at Long Length of Stay Meetings, dates discussed:    Additional Comments:  Sharin Mons, RN 04/04/2017, 6:33 PM

## 2017-04-04 NOTE — Evaluation (Signed)
Occupational Therapy Evaluation Patient Details Name: Chelsea Keller MRN: [id number] DOB: 1934-12-18 Today's Date: 04/04/2017    History of Present Illness 82 yo pt presents to the hospital with worsening left back/hip pain that radiates into her leg. She has also been feeling weak, dizzy for the last several weeks. She is on diuretics for LE edema. It appears that she is likely dehydrated and has hypokalemia from diuretics.   Clinical Impression   PTA, pt was living with her daughter and was performing her BADLs and cooking simple meals. Pt currently requiring Min A for LB ADLs and Min Guard for functional mobility with RW. Pt reporting that she has significant difficulty with toilet hygiene after BM and is motivated to participate in therapy to increase and maintain independence. Recommend dc home with HHOT to optimize safety and independence with ADLs and functional mobility. All acute OT needs met and further OT needs may be address with HHOT. Will sign off. Thank you.     Follow Up Recommendations  Home health OT;Supervision/Assistance - 24 hour    Equipment Recommendations  None recommended by OT    Recommendations for Other Services       Precautions / Restrictions Precautions Precautions: Fall Precaution Comments: bed alarm Restrictions Weight Bearing Restrictions: No      Mobility Bed Mobility Overal bed mobility: Needs Assistance Bed Mobility: Supine to Sit     Supine to sit: HOB elevated;Min guard     General bed mobility comments: Min Guard for safety. use of bed rails and elevated HOB to exit bed  Transfers Overall transfer level: Needs assistance Equipment used: Rolling walker (2 wheeled) Transfers: Sit to/from Stand Sit to Stand: Min guard         General transfer comment: Min Guard for safety    Balance Overall balance assessment: Needs assistance Sitting-balance support: No upper extremity supported;Feet supported Sitting balance-Leahy Scale:  Good     Standing balance support: No upper extremity supported;During functional activity Standing balance-Leahy Scale: Fair Standing balance comment: able to pull up underpants without holding RW                           ADL either performed or assessed with clinical judgement   ADL Overall ADL's : Needs assistance/impaired Eating/Feeding: Set up;Sitting   Grooming: Wash/dry hands;Min guard;Standing Grooming Details (indicate cue type and reason): Min Guard for safety Upper Body Bathing: Minimal assistance;Sitting   Lower Body Bathing: Minimal assistance;Sit to/from stand   Upper Body Dressing : Minimal assistance;Sitting   Lower Body Dressing: Minimal assistance;Sit to/from stand Lower Body Dressing Details (indicate cue type and reason): Min A to don sandals  Toilet Transfer: Solicitor;Ambulation;RW;Grab bars   Toileting- Clothing Manipulation and Hygiene: Min guard;Sit to/from stand Toileting - Clothing Manipulation Details (indicate cue type and reason): Pt performed toilet hygiene after urination with Min guard for safety. Pt discribing how she has significant difficulty with cleaning after a BM.     Functional mobility during ADLs: Min guard;Rolling walker General ADL Comments: Pt with difficulties with LB ADLs, bathing, and toilet hygiene. Pt motivated to participate in therapy and increase independence.      Vision         Perception     Praxis      Pertinent Vitals/Pain Pain Assessment: 0-10 Pain Score: 5  Pain Location: right buttock bruise; says legs feel good today Pain Descriptors / Indicators: Sore Pain Intervention(s): Monitored during session;Limited activity  within patient's tolerance;Repositioned     Hand Dominance     Extremity/Trunk Assessment Upper Extremity Assessment Upper Extremity Assessment: Generalized weakness;RUE deficits/detail RUE Deficits / Details: Limited ROM of R shoulder due to previous fx. Pt with  difficulty performing toilet hygiene due to decreased ROM RUE Coordination: decreased gross motor   Lower Extremity Assessment Lower Extremity Assessment: Generalized weakness   Cervical / Trunk Assessment Cervical / Trunk Assessment: Normal;Other exceptions Cervical / Trunk Exceptions: Increased body habitus   Communication Communication Communication: No difficulties   Cognition Arousal/Alertness: Awake/alert Behavior During Therapy: WFL for tasks assessed/performed Overall Cognitive Status: Within Functional Limits for tasks assessed                                     General Comments       Exercises     Shoulder Instructions      Home Living Family/patient expects to be discharged to:: Private residence Living Arrangements: Children Available Help at Discharge: Family;Available 24 hours/day Type of Home: House Home Access: Level entry     Home Layout: Two level Alternate Level Stairs-Number of Steps: 16 Alternate Level Stairs-Rails: Right Bathroom Shower/Tub: Tub/shower unit;Curtain   Bathroom Toilet: Standard     Home Equipment: Bedside commode;Tub bench;Walker - 4 wheels;Cane - single point;Grab bars - tub/shower   Additional Comments: does not like to use BSC, says she can't pee/poop on elevated seat      Prior Functioning/Environment Level of Independence: Independent with assistive device(s)        Comments: ADLs, cooking simple meals, and driving short distances        OT Problem List: Decreased strength;Decreased range of motion;Decreased activity tolerance;Impaired balance (sitting and/or standing);Decreased safety awareness;Decreased knowledge of use of DME or AE;Decreased knowledge of precautions;Pain      OT Treatment/Interventions:      OT Goals(Current goals can be found in the care plan section) Acute Rehab OT Goals Patient Stated Goal: maintain independence OT Goal Formulation: With patient Time For Goal Achievement:  04/18/17 Potential to Achieve Goals: Good  OT Frequency:     Barriers to D/C:            Co-evaluation              AM-PAC PT "6 Clicks" Daily Activity     Outcome Measure Help from another person eating meals?: None Help from another person taking care of personal grooming?: A Little Help from another person toileting, which includes using toliet, bedpan, or urinal?: A Lot Help from another person bathing (including washing, rinsing, drying)?: A Little Help from another person to put on and taking off regular upper body clothing?: A Little Help from another person to put on and taking off regular lower body clothing?: A Little 6 Click Score: 18   End of Session Equipment Utilized During Treatment: Rolling walker Nurse Communication: Mobility status;Other (comment);Patient requests pain meds ([place] placement)  Activity Tolerance: Patient tolerated treatment well;Patient limited by pain;Patient limited by fatigue Patient left: in bed;with call bell/phone within reach  OT Visit Diagnosis: Unsteadiness on feet (R26.81);Other abnormalities of gait and mobility (R26.89);Muscle weakness (generalized) (M62.81)                Time: 1434-1500 OT Time Calculation (min): 26 min Charges:  OT General Charges $OT Visit: 1 Visit OT Evaluation $OT Eval Moderate Complexity: 1 Mod OT Treatments $Self Care/Home Management : 8-22 mins  G-Codes: OT G-codes **NOT FOR INPATIENT CLASS** Functional Assessment Tool Used: Clinical judgement Functional Limitation: Self care Self Care Current Status (S3159): At least 1 percent but less than 20 percent impaired, limited or restricted Self Care Goal Status (Y5859): 0 percent impaired, limited or restricted Self Care Discharge Status [phone]): At least 1 percent but less than 20 percent impaired, limited or restricted   [place], OTR/L Acute Rehab Pager: [phone] Office: [place] 04/04/2017, 3:40 PM

## 2017-04-05 ENCOUNTER — Inpatient Hospital Stay (HOSPITAL_COMMUNITY): Payer: Medicare Other

## 2017-04-05 DIAGNOSIS — N179 Acute kidney failure, unspecified: Principal | ICD-10-CM

## 2017-04-05 MED ORDER — SENNA 8.6 MG PO TABS: 1.0000 | ORAL_TABLET | Freq: Every day | ORAL | 0 refills | Status: DC

## 2017-04-05 MED ORDER — OXYCODONE-ACETAMINOPHEN 5-325 MG PO TABS: 0.5000 | ORAL_TABLET | ORAL | 0 refills | Status: DC | PRN

## 2017-04-05 MED ORDER — MAGNESIUM CITRATE PO SOLN
1.0000 | Freq: Once | ORAL | 0 refills | Status: AC
Start: 2017-04-05 — End: 2017-04-05

## 2017-04-05 NOTE — Discharge Summary (Signed)
Physician Discharge Summary  Chelsea Keller JFH:[id number] DOB: 1935-06-01 DOA: 04/02/2017  PCP: Venia Carbon, MD  Admit date: 04/02/2017 Discharge date: 04/05/2017  Time spent: 25 minutes  Recommendations for Outpatient Follow-up:  1. Need slabs 1 week 2. Holding Lasix this admit-would not use for now as slight AKI on admit 3. You will need labs 1 week   Discharge Diagnoses:  Principal Problem:   Acute kidney injury ([place]) Active Problems:   Intractable pain-left hip   Fibromyalgia   Anemia   Chronic diastolic CHF (congestive heart failure) (HCC)   Hypertension   Hypertriglyceridemia   Vitamin B12 deficiency   CKD (chronic kidney disease) stage 3, GFR 30-59 ml/min (HCC)   Acute hypokalemia   Left sided sciatica   Pain   Discharge Condition: good  Diet recommendation: hh low salt  Filed Weights   04/02/17 2314 04/03/17 1042 04/04/17 0457  Weight: 119.7 kg (264 lb) 120.4 kg (265 lb 8 oz) 125.1 kg (275 lb 14.4 oz)    History of present illness:   82 y.o. female  Hypertension,  B12 anemia,  chronic diastolic heart failure,  Nl Stress myov 4/08-chr baseline dysp-NYHA 2 sympt  chronic kidney disease stage III  Hypertriglyceridemia Prior Tobacco  fibromyalgia.   Patient reports abrupt onset of above-stated pain that awakened her from sleep.  She attempted to ambulate but was unable.  In the past she has had similar pain that was successfully treated with NSAIDs.  She has remote history of sciatica and reports the symptoms are similar.   In the ER plain x-rays reveal no definitive etiology to her pain although it was discovered she had significant lumbar osteoarthritis. She had mild hyponatremia w/ hypokalemia (K+ 2.9) and an acute kidney injury. She has been given multiple pain medications and IV Decadron but was unable to ambulate safely in the ER so she will be placed in observation status for acute kidney injury and intractable pain.  Eventually she ambulated  well with prn OXY-scrpt was given [facility] had AKI this admission-her Lasix was held and it improved [facility] will need close follow up as an OP  She will require OP discussion about Lasix and pain control Would suggest bariatric referral   Discharge Exam: Vitals:   04/04/17 2048 04/05/17 0536  BP: (!) 150/69 132/82  Pulse: 75 65  Resp: 18 20  Temp: 97.8 F (36.6 C) (!) 97.4 F (36.3 C)  SpO2: 97% 97%    General: pleasant obese NT Nd No rebound no gaurding Cardiovascular: s1 s 2no m/r/g Respiratory: clear no added sound  Discharge Instructions   Discharge Instructions    Diet - low sodium heart healthy    Complete by:  As directed    Discharge instructions    Complete by:  As directed    Your pain should improve gradually We have ordered Home health on d/c home Would recommend that you get close follow-up as an OP with your primary MD Limited Rx of Opiates will be given to you--you should not be needing more than what has been scribed to you Please hold off your lasix for about 3-4 days until further labs--your kidneys were a little dry this admission and the dose may need adjustment when you see your primary MD   Increase activity slowly    Complete by:  As directed      Current Discharge Medication List    START taking these medications   Details  magnesium citrate SOLN Take 296 mLs (1  Bottle total) by mouth once. Qty: 195 mL, Refills: 0    oxyCODONE-acetaminophen (PERCOCET/ROXICET) 5-325 MG tablet Take 0.5-1 tablets by mouth every 4 (four) hours as needed for moderate pain or severe pain. Qty: 30 tablet, Refills: 0    senna (SENOKOT) 8.6 MG TABS tablet Take 1 tablet (8.6 mg total) by mouth daily. Qty: 120 each, Refills: 0      CONTINUE these medications which have NOT CHANGED   Details  acetaminophen (TYLENOL) 500 MG tablet Take 1,000 mg by mouth at bedtime as needed for mild pain or moderate pain.     potassium chloride SA (K-DUR,KLOR-CON) 20 MEQ tablet Take 20  mEq by mouth 2 (two) times daily.    triamterene-hydrochlorothiazide (MAXZIDE-25) 37.5-25 MG tablet Take 1 tablet by mouth daily. Qty: 90 tablet, Refills: 3    vitamin B-12 (CYANOCOBALAMIN) 1000 MCG tablet Take 1,000 mcg by mouth daily.      STOP taking these medications     furosemide (LASIX) 40 MG tablet      ibuprofen (ADVIL,MOTRIN) 200 MG tablet        Allergies  Allergen Reactions  . Codeine Sulfate Shortness Of Breath  . Celecoxib Other (See Comments)    Pt bled out  . Contrast Media [Iodinated Diagnostic Agents] Other (See Comments)    "Went numb, nausea and vomiting"  Recently had contrast and had no problems, stated the first time she had it caused N/V but she has had it since and was fine   . Erythromycin Base Nausea And Vomiting   Follow-up Information    [facility] Follow [facility].   Why:  home health services arranged, office will call and set up home visits Contact information: [address] [place] [address] [phone]            The results of significant diagnostics from this hospitalization (including imaging, microbiology, ancillary and laboratory) are listed below for reference.    Significant Diagnostic Studies: Dg Lumbar Spine Complete  Result Date: 04/03/2017 CLINICAL DATA:  Left buttock pain radiating down left leg. EXAM: LUMBAR SPINE - COMPLETE 4+ VIEW COMPARISON:  None FINDINGS: There is normal lumbar lordosis with normal lumbar segmentation. Mild physiologic wedging of the T12 and L1 vertebral bodies at the thoracolumbar junction. Facet joint space narrowing, hypertrophy and sclerosis is identified from L3 through S1 with moderate disc space narrowing at L4-5 and L5-S1. No pars defects. No acute appearing fracture. Disc pathology would be better assessed with MRI. Atherosclerosis of the overlying abdominal aorta and iliac vessels without aneurysm. Osteoarthritic sclerosis about both SI joints. Numerous surgical  clips are seen right upper quadrant of the abdomen. IMPRESSION: 1. Lumbar spondylosis with moderate disc space narrowing at L4-5 and L5-S1 and associated facet hypertrophy and sclerosis consistent with facet arthropathy from L3 through S1. 2. No acute lumbar fracture or bone destruction. 3. Osteoarthritis of both SI joints. Electronically Signed   By: Ashley Royalty M.D.   On: 04/03/2017 02:18   Mr Lumbar Spine Wo Contrast  Result Date: 04/03/2017 CLINICAL DATA:  Initial evaluation for degenerative disc disease. EXAM: MRI LUMBAR SPINE WITHOUT CONTRAST TECHNIQUE: Multiplanar, multisequence MR imaging of the lumbar spine was performed. No intravenous contrast was administered. COMPARISON:  Prior radiograph from earlier the same day. FINDINGS: Segmentation: Normal segmentation. Lowest well-formed disc labeled the L5-S1 level. Alignment: Trace anterolisthesis of L3 on L4 and L4 on L5. Vertebral bodies otherwise normally aligned with preservation of the normal lumbar lordosis. Vertebrae: Vertebral body  heights maintained. No evidence for acute or chronic fracture. Bone marrow signal intensity mildly heterogeneous but within normal limits. No worrisome osseous lesions. Small benign hemangioma noted within the L4 vertebral body. No abnormal marrow edema. Conus medullaris: Extends to the L1-2 level and appears normal. Paraspinal and other soft tissues: Paraspinous soft tissues within normal limits. T2 hyperintense cysts noted within left kidney. Visualized visceral structures otherwise unremarkable. Disc levels: L1-2:  Unremarkable. L2-3: Minimal annular disc bulge with disc desiccation. Mild facet and ligamentum flavum hypertrophy. No significant canal stenosis. Mild left L2 foraminal narrowing without impingement. L3-4: Trace anterolisthesis. Minimal diffuse disc bulge. Advanced facet arthrosis. Resultant moderate canal and bilateral subarticular stenosis. Suspected superimposed broad right extraforaminal/far lateral  disc protrusion, contacting the exiting right L3 nerve root without frank impingement (series 7, image 20). Mild bilateral L3 foraminal narrowing, right slightly worse than left. L4-5: Trace anterolisthesis. Diffuse disc bulge with disc desiccation and intervertebral disc space narrowing. Superimposed central disc protrusion indents the ventral thecal sac. Moderate facet arthrosis with probable ankylosis of the L4-5 facets. Resultant mild canal with mild to moderate bilateral subarticular stenosis. No significant foraminal encroachment. L5-S1: Chronic degenerative intervertebral disc space narrowing with disc desiccation and reactive endplate changes. Mild facet hypertrophy. Posterior disc osteophyte minimally flattens the ventral thecal sac without significant stenosis or neural impingement. Foramina remain patent. IMPRESSION: 1. Trace anterolisthesis of L3 on L4 with associated disc bulge and advanced facet arthropathy, resulting in moderate canal and bilateral subarticular stenosis. 2. Broad right extraforaminal disc protrusion at L3-4, closely approximating and potentially irritating the exiting right L3 nerve root. 3. Additional fairly mild degenerative changes for patient age without significant stenosis or evidence for neural impingement. Electronically Signed   By: Jeannine Boga M.D.   On: 04/03/2017 17:27   Mr Hip Left Wo Contrast  Result Date: 04/03/2017 CLINICAL DATA:  Left hip arthritis. EXAM: MR OF THE LEFT HIP WITHOUT CONTRAST TECHNIQUE: Multiplanar, multisequence MR imaging was performed. No intravenous contrast was administered. COMPARISON:  04/03/2017 FINDINGS: Bones: Sclerosis about both SI joints consistent with osteoarthritis or stigmata of chronic sacroiliitis. Minimal endplate spurring is seen of the included lumbar spine consistent with lumbar spondylosis. This is associated with mild moderate disc space narrowing from L2 caudad through S1. Articular cartilage and labrum Articular  cartilage: No focal chondral defect. Mild chondral thinning of the acetabular and femoral head cartilage bilaterally with slight joint space narrowing. Labrum: No labral tear is identified given lack of joint distention. Joint or bursal effusion Joint effusion:  No joint effusions. Bursae:  No bursal fluid collections. Muscles and tendons Muscles and tendons: Gluteal muscle strains adjacent to the greater trochanters bilaterally. Other findings Miscellaneous: There is mild-to-moderate subcutaneous edema about the pelvis with trace fluid adjacent to the left SI joint. IMPRESSION: 1. Sclerosis of both SI joints consistent osteoarthritis or stigmata of chronic sacroiliitis. 2. No acute fracture or bone destruction of the bony pelvis and hips. 3. Gluteal muscle edema consistent with muscle strain adjacent to the greater trochanters bilaterally. 4. Mild soft tissue anasarca. 5. Slight joint space narrowing of both hips with probable mild chondral thinning. No focal chondral defects. Electronically Signed   By: Ashley Royalty M.D.   On: 04/03/2017 19:34   Dg Hip Unilat W Or Wo Pelvis 2-3 Views Left  Result Date: 04/03/2017 CLINICAL DATA:  Awoke with left hip pain. EXAM: DG HIP (WITH OR WITHOUT PELVIS) 2-3V LEFT COMPARISON:  None. FINDINGS: There is no evidence of hip fracture or dislocation.  Minimal acetabular spurring is normal for age. There is no evidence of hip arthropathy or other focal bone abnormality. Sclerosis about bilateral sacroiliac joints can be seen with sacroiliitis. IMPRESSION: Mild for age left hip osteoarthritis. Bilateral sacroiliac joints scleroses may represent sacroiliitis. No evidence of acute osseous abnormality. Electronically Signed   By: Jeb Levering M.D.   On: 04/03/2017 02:16    Microbiology: No results found for this or any previous visit (from the past 240 hour(s)).   Labs: Basic Metabolic Panel:  Recent Labs Lab 04/02/17 2332 04/03/17 0955 04/04/17 0414  NA 134*  --  140   K 2.9*  --  3.6  CL 97*  --  103  CO2 26  --  30  GLUCOSE 131*  --  197*  BUN 31*  --  29*  CREATININE 1.42*  --  1.39*  CALCIUM 8.5*  --  8.6*  MG  --  2.1  --    Liver Function Tests:  Recent Labs Lab 04/04/17 0414  AST 35  ALT 28  ALKPHOS 85  BILITOT 0.6  PROT 5.5*  ALBUMIN 2.8*   No results for input(s): LIPASE, AMYLASE in the last 168 hours. No results for input(s): AMMONIA in the last 168 hours. CBC:  Recent Labs Lab 04/02/17 2332 04/04/17 0414  WBC 11.6* 16.7*  HGB 11.0* 11.3*  HCT 34.9* 37.1  MCV 84.9 87.1  PLT 341 371   Cardiac Enzymes: No results for input(s): CKTOTAL, CKMB, CKMBINDEX, TROPONINI in the last 168 hours. BNP: BNP (last 3 results) No results for input(s): BNP in the last 8760 hours.  ProBNP (last 3 results) No results for input(s): PROBNP in the last 8760 hours.  CBG: No results for input(s): GLUCAP in the last 168 hours.     SignedNita Sells MD   [facility] 04/05/2017, 10:09 AM

## 2017-04-05 NOTE — Consult Note (Signed)
[facility] CM Primary Care Navigator  04/05/2017  Chelsea Keller [birth year] [id number]    Met with patient and daughter Chelsea Keller) at the bedside to identify possible discharge needs.  Patient states having "excruciating lower back pain radiating to left hip causing difficulty walking" that resulted to this admission. [facility].   Patient shared using [facility] service and [facility] in [place] to obtain medications without difficulty.  Patientstates managing her medications athome straight out of the containers.   Patient mentioned that she drives prior to admission but daughter can provide transportation to her doctors'appointments after discharge.  Patient and daughter lives together. Daughter will be herprimary caregiver at home but also has grandsons who will assist with care if needed.  Anticipated discharge plan is home with home health services per patient.  Patient and daughter voiced understanding to call primary care provider's office for a post discharge follow-up appointment within a week or sooner if needs arise.Patient letter (with PCP's contact number) was provided as their reminder.  Explained to patient and daughterregardingTHN CM services available for herbut she denies any and states "not having any issues for right now."  Daughter states that Dr. Angelena Form (cardiologist) is whom patient follows up with if needing help to manage HF. Patient and daughter expressed awarenessto seek referral from primary care provider to [facility] care management if deemed necessaryfor services in the nearfuture.  [facility] care management information provided for future needs that may arise  However, patient had only opted and verbally agreed to [facility] follow-up calls to monitor herrecovery at home.  Referral made for [facility] General calls after discharge.     For questions, please contact:  Dannielle Huh, BSN, RN- [facility] Primary Care Navigator  Telephone: [phone] [place]

## 2017-04-05 NOTE — Progress Notes (Signed)
Patient discharge teaching given, including activity, diet, follow-up appoints, and medications. Patient verbalized understanding of all discharge instructions. IV access was d/c'd. Vitals are stable. Skin is intact except as charted in most recent assessments. Discharge awaiting daughters arrival to take patient home. Pt to be escorted out by NT, to be driven home by family.

## 2017-04-06 ENCOUNTER — Encounter: Payer: Medicare Other | Admitting: Internal Medicine

## 2017-04-06 DIAGNOSIS — F329 Major depressive disorder, single episode, unspecified: Secondary | ICD-10-CM | POA: Diagnosis not present

## 2017-04-06 DIAGNOSIS — D513 Other dietary vitamin B12 deficiency anemia: Secondary | ICD-10-CM | POA: Diagnosis not present

## 2017-04-06 DIAGNOSIS — M25552 Pain in left hip: Secondary | ICD-10-CM | POA: Diagnosis not present

## 2017-04-06 DIAGNOSIS — F419 Anxiety disorder, unspecified: Secondary | ICD-10-CM | POA: Diagnosis not present

## 2017-04-06 DIAGNOSIS — Z87891 Personal history of nicotine dependence: Secondary | ICD-10-CM | POA: Diagnosis not present

## 2017-04-06 DIAGNOSIS — M797 Fibromyalgia: Secondary | ICD-10-CM | POA: Diagnosis not present

## 2017-04-06 DIAGNOSIS — I5032 Chronic diastolic (congestive) heart failure: Secondary | ICD-10-CM | POA: Diagnosis not present

## 2017-04-06 DIAGNOSIS — I872 Venous insufficiency (chronic) (peripheral): Secondary | ICD-10-CM | POA: Diagnosis not present

## 2017-04-06 DIAGNOSIS — M1990 Unspecified osteoarthritis, unspecified site: Secondary | ICD-10-CM | POA: Diagnosis not present

## 2017-04-06 DIAGNOSIS — N183 Chronic kidney disease, stage 3 (moderate): Secondary | ICD-10-CM | POA: Diagnosis not present

## 2017-04-06 DIAGNOSIS — M5432 Sciatica, left side: Secondary | ICD-10-CM | POA: Diagnosis not present

## 2017-04-06 DIAGNOSIS — I130 Hypertensive heart and chronic kidney disease with heart failure and stage 1 through stage 4 chronic kidney disease, or unspecified chronic kidney disease: Secondary | ICD-10-CM | POA: Diagnosis not present

## 2017-04-06 DIAGNOSIS — E781 Pure hyperglyceridemia: Secondary | ICD-10-CM | POA: Diagnosis not present

## 2017-04-06 LAB — URINE CULTURE

## 2017-04-08 DIAGNOSIS — M25552 Pain in left hip: Secondary | ICD-10-CM | POA: Diagnosis not present

## 2017-04-08 DIAGNOSIS — N183 Chronic kidney disease, stage 3 (moderate): Secondary | ICD-10-CM | POA: Diagnosis not present

## 2017-04-08 DIAGNOSIS — I5032 Chronic diastolic (congestive) heart failure: Secondary | ICD-10-CM | POA: Diagnosis not present

## 2017-04-08 DIAGNOSIS — D513 Other dietary vitamin B12 deficiency anemia: Secondary | ICD-10-CM | POA: Diagnosis not present

## 2017-04-08 DIAGNOSIS — I130 Hypertensive heart and chronic kidney disease with heart failure and stage 1 through stage 4 chronic kidney disease, or unspecified chronic kidney disease: Secondary | ICD-10-CM | POA: Diagnosis not present

## 2017-04-08 DIAGNOSIS — M5432 Sciatica, left side: Secondary | ICD-10-CM | POA: Diagnosis not present

## 2017-04-09 ENCOUNTER — Telehealth: Payer: Self-pay | Admitting: Internal Medicine

## 2017-04-09 NOTE — Telephone Encounter (Signed)
That is okay.

## 2017-04-09 NOTE — Telephone Encounter (Signed)
Left verbal order on vm for pt.

## 2017-04-09 NOTE — Telephone Encounter (Signed)
Caller Name:Esther Relationship to Patient:[facility] number:[phone] Pharmacy:  Reason for call:  Verbal orders for OT 2 week 1

## 2017-04-10 ENCOUNTER — Other Ambulatory Visit: Payer: Self-pay | Admitting: Internal Medicine

## 2017-04-10 MED ORDER — SULFAMETHOXAZOLE-TRIMETHOPRIM 800-160 MG PO TABS: 1.0000 | ORAL_TABLET | Freq: Two times a day (BID) | ORAL | 0 refills | Status: DC

## 2017-04-12 DIAGNOSIS — N183 Chronic kidney disease, stage 3 (moderate): Secondary | ICD-10-CM | POA: Diagnosis not present

## 2017-04-12 DIAGNOSIS — I5032 Chronic diastolic (congestive) heart failure: Secondary | ICD-10-CM | POA: Diagnosis not present

## 2017-04-12 DIAGNOSIS — M25552 Pain in left hip: Secondary | ICD-10-CM | POA: Diagnosis not present

## 2017-04-12 DIAGNOSIS — M25561 Pain in right knee: Secondary | ICD-10-CM | POA: Diagnosis not present

## 2017-04-12 DIAGNOSIS — M19072 Primary osteoarthritis, left ankle and foot: Secondary | ICD-10-CM | POA: Diagnosis not present

## 2017-04-12 DIAGNOSIS — D513 Other dietary vitamin B12 deficiency anemia: Secondary | ICD-10-CM | POA: Diagnosis not present

## 2017-04-12 DIAGNOSIS — M5432 Sciatica, left side: Secondary | ICD-10-CM | POA: Diagnosis not present

## 2017-04-12 DIAGNOSIS — I130 Hypertensive heart and chronic kidney disease with heart failure and stage 1 through stage 4 chronic kidney disease, or unspecified chronic kidney disease: Secondary | ICD-10-CM | POA: Diagnosis not present

## 2017-04-13 DIAGNOSIS — I5032 Chronic diastolic (congestive) heart failure: Secondary | ICD-10-CM | POA: Diagnosis not present

## 2017-04-13 DIAGNOSIS — M25552 Pain in left hip: Secondary | ICD-10-CM | POA: Diagnosis not present

## 2017-04-13 DIAGNOSIS — I130 Hypertensive heart and chronic kidney disease with heart failure and stage 1 through stage 4 chronic kidney disease, or unspecified chronic kidney disease: Secondary | ICD-10-CM | POA: Diagnosis not present

## 2017-04-13 DIAGNOSIS — M5432 Sciatica, left side: Secondary | ICD-10-CM | POA: Diagnosis not present

## 2017-04-13 DIAGNOSIS — N183 Chronic kidney disease, stage 3 (moderate): Secondary | ICD-10-CM | POA: Diagnosis not present

## 2017-04-13 DIAGNOSIS — D513 Other dietary vitamin B12 deficiency anemia: Secondary | ICD-10-CM | POA: Diagnosis not present

## 2017-04-15 DIAGNOSIS — M5432 Sciatica, left side: Secondary | ICD-10-CM | POA: Diagnosis not present

## 2017-04-15 DIAGNOSIS — M25552 Pain in left hip: Secondary | ICD-10-CM | POA: Diagnosis not present

## 2017-04-15 DIAGNOSIS — N183 Chronic kidney disease, stage 3 (moderate): Secondary | ICD-10-CM | POA: Diagnosis not present

## 2017-04-15 DIAGNOSIS — I5032 Chronic diastolic (congestive) heart failure: Secondary | ICD-10-CM | POA: Diagnosis not present

## 2017-04-15 DIAGNOSIS — I130 Hypertensive heart and chronic kidney disease with heart failure and stage 1 through stage 4 chronic kidney disease, or unspecified chronic kidney disease: Secondary | ICD-10-CM | POA: Diagnosis not present

## 2017-04-15 DIAGNOSIS — D513 Other dietary vitamin B12 deficiency anemia: Secondary | ICD-10-CM | POA: Diagnosis not present

## 2017-04-20 DIAGNOSIS — M5432 Sciatica, left side: Secondary | ICD-10-CM | POA: Diagnosis not present

## 2017-04-20 DIAGNOSIS — N183 Chronic kidney disease, stage 3 (moderate): Secondary | ICD-10-CM | POA: Diagnosis not present

## 2017-04-20 DIAGNOSIS — I130 Hypertensive heart and chronic kidney disease with heart failure and stage 1 through stage 4 chronic kidney disease, or unspecified chronic kidney disease: Secondary | ICD-10-CM | POA: Diagnosis not present

## 2017-04-20 DIAGNOSIS — D513 Other dietary vitamin B12 deficiency anemia: Secondary | ICD-10-CM | POA: Diagnosis not present

## 2017-04-20 DIAGNOSIS — M25552 Pain in left hip: Secondary | ICD-10-CM | POA: Diagnosis not present

## 2017-04-20 DIAGNOSIS — I5032 Chronic diastolic (congestive) heart failure: Secondary | ICD-10-CM | POA: Diagnosis not present

## 2017-04-22 DIAGNOSIS — I130 Hypertensive heart and chronic kidney disease with heart failure and stage 1 through stage 4 chronic kidney disease, or unspecified chronic kidney disease: Secondary | ICD-10-CM | POA: Diagnosis not present

## 2017-04-22 DIAGNOSIS — M5432 Sciatica, left side: Secondary | ICD-10-CM | POA: Diagnosis not present

## 2017-04-22 DIAGNOSIS — M25552 Pain in left hip: Secondary | ICD-10-CM | POA: Diagnosis not present

## 2017-04-22 DIAGNOSIS — D513 Other dietary vitamin B12 deficiency anemia: Secondary | ICD-10-CM | POA: Diagnosis not present

## 2017-04-22 DIAGNOSIS — I5032 Chronic diastolic (congestive) heart failure: Secondary | ICD-10-CM | POA: Diagnosis not present

## 2017-04-22 DIAGNOSIS — N183 Chronic kidney disease, stage 3 (moderate): Secondary | ICD-10-CM | POA: Diagnosis not present

## 2017-04-27 DIAGNOSIS — I5032 Chronic diastolic (congestive) heart failure: Secondary | ICD-10-CM | POA: Diagnosis not present

## 2017-04-27 DIAGNOSIS — N183 Chronic kidney disease, stage 3 (moderate): Secondary | ICD-10-CM | POA: Diagnosis not present

## 2017-04-27 DIAGNOSIS — I130 Hypertensive heart and chronic kidney disease with heart failure and stage 1 through stage 4 chronic kidney disease, or unspecified chronic kidney disease: Secondary | ICD-10-CM | POA: Diagnosis not present

## 2017-04-27 DIAGNOSIS — M5432 Sciatica, left side: Secondary | ICD-10-CM | POA: Diagnosis not present

## 2017-04-27 DIAGNOSIS — M25552 Pain in left hip: Secondary | ICD-10-CM | POA: Diagnosis not present

## 2017-04-27 DIAGNOSIS — D513 Other dietary vitamin B12 deficiency anemia: Secondary | ICD-10-CM | POA: Diagnosis not present

## 2017-04-29 DIAGNOSIS — I5032 Chronic diastolic (congestive) heart failure: Secondary | ICD-10-CM | POA: Diagnosis not present

## 2017-04-29 DIAGNOSIS — M5432 Sciatica, left side: Secondary | ICD-10-CM | POA: Diagnosis not present

## 2017-04-29 DIAGNOSIS — M25552 Pain in left hip: Secondary | ICD-10-CM | POA: Diagnosis not present

## 2017-04-29 DIAGNOSIS — D513 Other dietary vitamin B12 deficiency anemia: Secondary | ICD-10-CM | POA: Diagnosis not present

## 2017-04-29 DIAGNOSIS — N183 Chronic kidney disease, stage 3 (moderate): Secondary | ICD-10-CM | POA: Diagnosis not present

## 2017-04-29 DIAGNOSIS — I130 Hypertensive heart and chronic kidney disease with heart failure and stage 1 through stage 4 chronic kidney disease, or unspecified chronic kidney disease: Secondary | ICD-10-CM | POA: Diagnosis not present

## 2017-05-05 DIAGNOSIS — M5432 Sciatica, left side: Secondary | ICD-10-CM | POA: Diagnosis not present

## 2017-05-05 DIAGNOSIS — M25552 Pain in left hip: Secondary | ICD-10-CM | POA: Diagnosis not present

## 2017-05-05 DIAGNOSIS — N183 Chronic kidney disease, stage 3 (moderate): Secondary | ICD-10-CM | POA: Diagnosis not present

## 2017-05-05 DIAGNOSIS — D513 Other dietary vitamin B12 deficiency anemia: Secondary | ICD-10-CM | POA: Diagnosis not present

## 2017-05-05 DIAGNOSIS — I130 Hypertensive heart and chronic kidney disease with heart failure and stage 1 through stage 4 chronic kidney disease, or unspecified chronic kidney disease: Secondary | ICD-10-CM | POA: Diagnosis not present

## 2017-05-05 DIAGNOSIS — I5032 Chronic diastolic (congestive) heart failure: Secondary | ICD-10-CM | POA: Diagnosis not present

## 2017-05-11 DIAGNOSIS — I130 Hypertensive heart and chronic kidney disease with heart failure and stage 1 through stage 4 chronic kidney disease, or unspecified chronic kidney disease: Secondary | ICD-10-CM | POA: Diagnosis not present

## 2017-05-11 DIAGNOSIS — M25552 Pain in left hip: Secondary | ICD-10-CM | POA: Diagnosis not present

## 2017-05-11 DIAGNOSIS — D513 Other dietary vitamin B12 deficiency anemia: Secondary | ICD-10-CM | POA: Diagnosis not present

## 2017-05-11 DIAGNOSIS — I5032 Chronic diastolic (congestive) heart failure: Secondary | ICD-10-CM | POA: Diagnosis not present

## 2017-05-11 DIAGNOSIS — M5432 Sciatica, left side: Secondary | ICD-10-CM | POA: Diagnosis not present

## 2017-05-11 DIAGNOSIS — N183 Chronic kidney disease, stage 3 (moderate): Secondary | ICD-10-CM | POA: Diagnosis not present

## 2017-05-26 DIAGNOSIS — Z6841 Body Mass Index (BMI) 40.0 and over, adult: Secondary | ICD-10-CM | POA: Diagnosis not present

## 2017-05-26 DIAGNOSIS — N3281 Overactive bladder: Secondary | ICD-10-CM | POA: Diagnosis not present

## 2017-05-26 DIAGNOSIS — R600 Localized edema: Secondary | ICD-10-CM | POA: Diagnosis not present

## 2017-05-26 DIAGNOSIS — R351 Nocturia: Secondary | ICD-10-CM | POA: Diagnosis not present

## 2017-05-26 DIAGNOSIS — N3946 Mixed incontinence: Secondary | ICD-10-CM | POA: Diagnosis not present

## 2017-05-26 DIAGNOSIS — R32 Unspecified urinary incontinence: Secondary | ICD-10-CM | POA: Insufficient documentation

## 2017-06-01 ENCOUNTER — Ambulatory Visit (INDEPENDENT_AMBULATORY_CARE_PROVIDER_SITE_OTHER): Payer: Medicare Other | Admitting: Family Medicine

## 2017-06-01 ENCOUNTER — Encounter: Payer: Self-pay | Admitting: Family Medicine

## 2017-06-01 ENCOUNTER — Telehealth: Payer: Self-pay | Admitting: Internal Medicine

## 2017-06-01 VITALS — BP 136/80 | HR 83 | Temp 97.6°F | Wt 277.0 lb

## 2017-06-01 DIAGNOSIS — R109 Unspecified abdominal pain: Secondary | ICD-10-CM | POA: Diagnosis not present

## 2017-06-01 LAB — CBC WITH DIFFERENTIAL/PLATELET
BASOS ABS: 0.1 10*3/uL (ref 0.0–0.1)
BASOS PCT: 0.9 % (ref 0.0–3.0)
EOS PCT: 4.2 % (ref 0.0–5.0)
Eosinophils Absolute: 0.4 10*3/uL (ref 0.0–0.7)
HCT: 35.6 % — ABNORMAL LOW (ref 36.0–46.0)
Hemoglobin: 11.3 g/dL — ABNORMAL LOW (ref 12.0–15.0)
LYMPHS ABS: 2.1 10*3/uL (ref 0.7–4.0)
Lymphocytes Relative: 20 % (ref 12.0–46.0)
MCHC: 31.7 g/dL (ref 30.0–36.0)
MCV: 84.8 fl (ref 78.0–100.0)
MONOS PCT: 9.3 % (ref 3.0–12.0)
Monocytes Absolute: 1 10*3/uL (ref 0.1–1.0)
NEUTROS ABS: 7 10*3/uL (ref 1.4–7.7)
NEUTROS PCT: 65.6 % (ref 43.0–77.0)
PLATELETS: 300 10*3/uL (ref 150.0–400.0)
RBC: 4.2 Mil/uL (ref 3.87–5.11)
RDW: 17.8 % — AB (ref 11.5–15.5)
WBC: 10.7 10*3/uL — ABNORMAL HIGH (ref 4.0–10.5)

## 2017-06-01 LAB — COMPREHENSIVE METABOLIC PANEL
ALT: 8 U/L (ref 0–35)
AST: 14 U/L (ref 0–37)
Albumin: 3.7 g/dL (ref 3.5–5.2)
Alkaline Phosphatase: 81 U/L (ref 39–117)
BILIRUBIN TOTAL: 0.5 mg/dL (ref 0.2–1.2)
BUN: 22 mg/dL (ref 6–23)
CO2: 28 meq/L (ref 19–32)
Calcium: 9.3 mg/dL (ref 8.4–10.5)
Chloride: 103 mEq/L (ref 96–112)
Creatinine, Ser: 1.06 mg/dL (ref 0.40–1.20)
GFR: 52.93 mL/min — AB (ref >60.00)
GLUCOSE: 119 mg/dL — AB (ref 70–99)
Potassium: 3.8 mEq/L (ref 3.5–5.1)
Sodium: 140 mEq/L (ref 135–145)
Total Protein: 6.3 g/dL (ref 6.0–8.3)

## 2017-06-01 MED ORDER — FLUCONAZOLE 100 MG PO TABS: 200.0000 mg | ORAL_TABLET | ORAL | 0 refills | Status: DC

## 2017-06-01 MED ORDER — SULFAMETHOXAZOLE-TRIMETHOPRIM 800-160 MG PO TABS: 1.0000 | ORAL_TABLET | Freq: Two times a day (BID) | ORAL | 0 refills | Status: DC

## 2017-06-01 NOTE — Progress Notes (Signed)
BP 136/80 (BP Location: Left Arm, Patient Position: Sitting, Cuff Size: Normal)   Pulse 83   Temp 97.6 F (36.4 C) (Oral)   Wt 277 lb (125.6 kg)   SpO2 98%   BMI 50.66 kg/m    CC: L side pain Subjective:    Patient ID: Chelsea Keller, female    DOB: Mar 18, 1935, 82 y.o.   MRN: [id number]  HPI: Chelsea Keller is a 82 y.o. female presenting on 06/01/2017 for Flank Pain (right side, radiates to back. Last night pt was sitting at computer at home and went to swing around to get up and felt a sharp pain. Taken 1/2 tab of oxycodone, no help)   Here alone today, ambulates with rollator. While sitting at computer this morning 3:30am went to get up to go to bed and felt tightness and excruciating pain RUQ with radiation to mid back. Treated at home with oxycodone which has not helped. Pain is ongoing. Denies associated fevers/chills, nausea/vomiting, diarrhea or blood in stool. Denies dysuria, hematuria or new urgency. No cough, dyspnea, wheezing. Denies inciting trauma/injury or falls. No skin rash. Pain worse with deep breath or lifting right arm.   Intermittent LLQ and L flank pain since 02/2017 - pain seemed isolated to abdominal pannus. Plan at that time was CT if ongoing pain.   Recent hospitalization for L sided sciatica and found to have mild AKI.   Recently established with Dr Zigmund Daniel [facility]) for severe urinary leakage urge incontinence.   S/p cholecystectomy and hysterectomy.   Relevant past medical, surgical, family and social history reviewed and updated as indicated. Interim medical history since our last visit reviewed. Allergies and medications reviewed and updated. Outpatient Medications Prior to Visit  Medication Sig Dispense Refill  . acetaminophen (TYLENOL) 500 MG tablet Take 1,000 mg by mouth at bedtime as needed for mild pain or moderate pain.     Marland Kitchen oxyCODONE-acetaminophen (PERCOCET/ROXICET) 5-325 MG tablet Take 0.5-1 tablets by mouth every 4 (four) hours as needed for  moderate pain or severe pain. 30 tablet 0  . potassium chloride SA (K-DUR,KLOR-CON) 20 MEQ tablet Take 20 mEq by mouth 2 (two) times daily.    Marland Kitchen senna (SENOKOT) 8.6 MG TABS tablet Take 1 tablet (8.6 mg total) by mouth daily. 120 each 0  . triamterene-hydrochlorothiazide (MAXZIDE-25) 37.5-25 MG tablet Take 1 tablet by mouth daily. 90 tablet 3  . vitamin B-12 (CYANOCOBALAMIN) 1000 MCG tablet Take 1,000 mcg by mouth daily.    Marland Kitchen sulfamethoxazole-trimethoprim (BACTRIM DS,SEPTRA DS) 800-160 MG tablet Take 1 tablet by mouth 2 (two) times daily. 6 tablet 0   No facility-administered medications prior to visit.      Per HPI unless specifically indicated in ROS section below Review of Systems     Objective:    BP 136/80 (BP Location: Left Arm, Patient Position: Sitting, Cuff Size: Normal)   Pulse 83   Temp 97.6 F (36.4 C) (Oral)   Wt 277 lb (125.6 kg)   SpO2 98%   BMI 50.66 kg/m   Wt Readings from Last 3 Encounters:  06/01/17 277 lb (125.6 kg)  04/04/17 275 lb 14.4 oz (125.1 kg)  02/11/17 275 lb (124.7 kg)    Physical Exam  Constitutional: She appears well-developed and well-nourished. No distress.  HENT:  Mouth/Throat: Oropharynx is clear and moist. No oropharyngeal exudate.  Cardiovascular: Normal rate, regular rhythm, normal heart sounds and intact distal pulses.  No murmur heard. Pulmonary/Chest: Effort normal and breath sounds normal. No respiratory distress. She  has no wheezes. She has no rales.  Abdominal: Soft. Normal appearance and bowel sounds are normal. She exhibits no distension and no mass. There is no hepatosplenomegaly. There is tenderness (pain seems superficial) in the right upper quadrant. There is no rigidity, no rebound, no guarding, no CVA tenderness and negative Murphy's sign.  Musculoskeletal: She exhibits no edema.  Skin: Skin is warm and dry. Rash noted. There is erythema.     Painful erythematous rash along lateral pannus of R breast No vesicular  lesions Mild tender induration without erythema to soft tissue R mid thoracic back and R upper abdominal wall  Psychiatric: She has a normal mood and affect.  Nursing note and vitals reviewed.     Assessment & Plan:   Problem List Items Addressed This Visit    Right sided abdominal pain - Primary    Sudden onset right abdominal pain that seems rather superficial isolated to skin - anticipate pain coming from evident R lateral side intertrigo as well as possible mild panniculitis. Treat for both with diflucan course, bactrim, clotrimazole topically. Update if not improving. Given location and difficult exam, check CBC, CMP for further eval. Pt agrees with plan.       Relevant Orders   Comprehensive metabolic panel   CBC with Differential/Platelet       Follow up plan: Return if symptoms worsen or fail to improve.  Ria Bush, MD

## 2017-06-01 NOTE — Telephone Encounter (Signed)
Form done No charge 

## 2017-06-01 NOTE — Patient Instructions (Addendum)
You have intertrigo - fungal infection - and you may also have skin infection.  Treat with diflucan 2 tablets weekly for 4 weeks and bactrim 1 week course. Use topical clotrimazole (lotrimin) to red skin rash on right side.  Let us know if not improving with this.

## 2017-06-01 NOTE — Telephone Encounter (Signed)
Patient dropped off a Disability Parking form for Dr. Silvio Pate to fill out. I will put in RX tower.

## 2017-06-01 NOTE — Assessment & Plan Note (Signed)
Sudden onset right abdominal pain that seems rather superficial isolated to skin - anticipate pain coming from evident R lateral side intertrigo as well as possible mild panniculitis. Treat for both with diflucan course, bactrim, clotrimazole topically. Update if not improving. Given location and difficult exam, check CBC, CMP for further eval. Pt agrees with plan.

## 2017-06-01 NOTE — Telephone Encounter (Signed)
Patient notified form is ready for pick up. °

## 2017-06-05 DIAGNOSIS — I2699 Other pulmonary embolism without acute cor pulmonale: Secondary | ICD-10-CM

## 2017-06-05 HISTORY — DX: Other pulmonary embolism without acute cor pulmonale: I26.99

## 2017-06-10 ENCOUNTER — Other Ambulatory Visit: Payer: Self-pay | Admitting: Internal Medicine

## 2017-06-22 ENCOUNTER — Telehealth: Payer: Self-pay | Admitting: *Deleted

## 2017-06-22 ENCOUNTER — Emergency Department (HOSPITAL_COMMUNITY): Payer: Medicare Other

## 2017-06-22 ENCOUNTER — Telehealth: Payer: Self-pay | Admitting: Cardiovascular Disease

## 2017-06-22 ENCOUNTER — Encounter (HOSPITAL_COMMUNITY): Payer: Self-pay | Admitting: *Deleted

## 2017-06-22 DIAGNOSIS — R0602 Shortness of breath: Secondary | ICD-10-CM | POA: Diagnosis not present

## 2017-06-22 DIAGNOSIS — M25552 Pain in left hip: Secondary | ICD-10-CM | POA: Diagnosis present

## 2017-06-22 DIAGNOSIS — Z881 Allergy status to other antibiotic agents status: Secondary | ICD-10-CM

## 2017-06-22 DIAGNOSIS — L89152 Pressure ulcer of sacral region, stage 2: Secondary | ICD-10-CM | POA: Diagnosis present

## 2017-06-22 DIAGNOSIS — M109 Gout, unspecified: Secondary | ICD-10-CM | POA: Diagnosis present

## 2017-06-22 DIAGNOSIS — Z87891 Personal history of nicotine dependence: Secondary | ICD-10-CM

## 2017-06-22 DIAGNOSIS — D649 Anemia, unspecified: Secondary | ICD-10-CM | POA: Diagnosis present

## 2017-06-22 DIAGNOSIS — D72829 Elevated white blood cell count, unspecified: Secondary | ICD-10-CM | POA: Diagnosis present

## 2017-06-22 DIAGNOSIS — I2699 Other pulmonary embolism without acute cor pulmonale: Secondary | ICD-10-CM | POA: Diagnosis not present

## 2017-06-22 DIAGNOSIS — Z888 Allergy status to other drugs, medicaments and biological substances status: Secondary | ICD-10-CM

## 2017-06-22 DIAGNOSIS — I130 Hypertensive heart and chronic kidney disease with heart failure and stage 1 through stage 4 chronic kidney disease, or unspecified chronic kidney disease: Secondary | ICD-10-CM | POA: Diagnosis not present

## 2017-06-22 DIAGNOSIS — Z6841 Body Mass Index (BMI) 40.0 and over, adult: Secondary | ICD-10-CM

## 2017-06-22 DIAGNOSIS — T380X5A Adverse effect of glucocorticoids and synthetic analogues, initial encounter: Secondary | ICD-10-CM | POA: Diagnosis present

## 2017-06-22 DIAGNOSIS — G8929 Other chronic pain: Secondary | ICD-10-CM | POA: Diagnosis present

## 2017-06-22 DIAGNOSIS — N183 Chronic kidney disease, stage 3 (moderate): Secondary | ICD-10-CM | POA: Diagnosis present

## 2017-06-22 DIAGNOSIS — M797 Fibromyalgia: Secondary | ICD-10-CM | POA: Diagnosis present

## 2017-06-22 DIAGNOSIS — Z885 Allergy status to narcotic agent status: Secondary | ICD-10-CM

## 2017-06-22 DIAGNOSIS — L409 Psoriasis, unspecified: Secondary | ICD-10-CM | POA: Diagnosis present

## 2017-06-22 DIAGNOSIS — I5031 Acute diastolic (congestive) heart failure: Secondary | ICD-10-CM | POA: Diagnosis not present

## 2017-06-22 DIAGNOSIS — E785 Hyperlipidemia, unspecified: Secondary | ICD-10-CM | POA: Diagnosis present

## 2017-06-22 DIAGNOSIS — M1990 Unspecified osteoarthritis, unspecified site: Secondary | ICD-10-CM | POA: Diagnosis present

## 2017-06-22 DIAGNOSIS — I5032 Chronic diastolic (congestive) heart failure: Secondary | ICD-10-CM | POA: Diagnosis not present

## 2017-06-22 DIAGNOSIS — I110 Hypertensive heart disease with heart failure: Secondary | ICD-10-CM | POA: Diagnosis not present

## 2017-06-22 DIAGNOSIS — M5432 Sciatica, left side: Secondary | ICD-10-CM | POA: Diagnosis present

## 2017-06-22 DIAGNOSIS — Z96652 Presence of left artificial knee joint: Secondary | ICD-10-CM | POA: Diagnosis present

## 2017-06-22 DIAGNOSIS — I872 Venous insufficiency (chronic) (peripheral): Secondary | ICD-10-CM | POA: Diagnosis present

## 2017-06-22 LAB — CBC
HEMATOCRIT: 35.9 % — AB (ref 36.0–46.0)
HEMOGLOBIN: 11.2 g/dL — AB (ref 12.0–15.0)
MCH: 26.7 pg (ref 26.0–34.0)
MCHC: 31.2 g/dL (ref 30.0–36.0)
MCV: 85.5 fL (ref 78.0–100.0)
Platelets: 281 10*3/uL (ref 150–400)
RBC: 4.2 MIL/uL (ref 3.87–5.11)
RDW: 16.1 % — AB (ref 11.5–15.5)
WBC: 10.9 10*3/uL — AB (ref 4.0–10.5)

## 2017-06-22 LAB — BASIC METABOLIC PANEL
Anion gap: 8 (ref 5–15)
BUN: 13 mg/dL (ref 6–20)
CHLORIDE: 104 mmol/L (ref 101–111)
CO2: 27 mmol/L (ref 22–32)
Calcium: 9.3 mg/dL (ref 8.9–10.3)
Creatinine, Ser: 1 mg/dL (ref 0.44–1.00)
GFR calc Af Amer: >60 mL/min (ref >60)
GFR calc non Af Amer: 52 mL/min — ABNORMAL LOW (ref >60)
GLUCOSE: 106 mg/dL — AB (ref 65–99)
POTASSIUM: 3.7 mmol/L (ref 3.5–5.1)
Sodium: 139 mmol/L (ref 135–145)

## 2017-06-22 LAB — I-STAT TROPONIN, ED: Troponin i, poc: 0.03 ng/mL (ref 0.00–0.08)

## 2017-06-22 NOTE — Telephone Encounter (Signed)
I spoke with patient's daughter, [name]. [name] states for the past week pt has increasing shortness of breath, lower extremity edema- right worse than left, right weeping and shiny. [name] does not know pt's weight, heart rate or BP, pt not currently taking lasix due to abnormal lab in past.    I reviewed with Dr Lauralyn Primes should follow-up with primary care, cardiology will be available if needed.

## 2017-06-22 NOTE — ED Notes (Signed)
Due to xray results, acuity level upgraded

## 2017-06-22 NOTE — ED Triage Notes (Signed)
Pt reports shortness of breath that has become worse over the past week. Increased fluid in her legs. Reports when she takes a deep breath she has pain in the left side. +cough. Hx of CHF

## 2017-06-22 NOTE — Telephone Encounter (Signed)
Okay to add on Thursday or Monday (I will add after noon if I am allowed)

## 2017-06-22 NOTE — Telephone Encounter (Signed)
Chelsea Keller is aware of Dr Thompson Caul recommendation to follow-up with primary care.

## 2017-06-22 NOTE — Telephone Encounter (Signed)
Copied from [place] [id number]. Topic: Appointment Scheduling - Prior Auth Required for Appointment >> Jun 22, 2017  9:06 AM [name] wrote: Pt's daughter Chelsea Keller called and wants to know if she could bring her mother in before the weekend to see Chelsea Keller from a follow up appt on 11/27. She saw Chelsea Keller that day bc she could not see Chelsea. Silvio Keller. I called the office to see if I could have approval to use the [place] slots on Thursday. I did not get anyone. Daughter said it was okay for someone to call her back if she could please be worked in, she said she doesn't want to end up in the ER with her mom before the hoildays. Her number is [phone] (work) can be reached there until 4:30, and after call her cell [phone]. I did not place her with another Chelsea as she is requesting Chelsea Keller (pcp)

## 2017-06-22 NOTE — Telephone Encounter (Signed)
Pt c/o swelling: STAT is pt has developed SOB within 24 hours  How much weight have you gained and in what time span? Week    If swelling, where is the swelling located? Legs  1) Are you currently taking a fluid pill? yes  2) Are you currently SOB? yes  3) Do you have a log of your daily weights (if so, list)? no  4) Have you gained 3 pounds in a day or 5 pounds in a week? Unknown   5) Have you traveled recently? no

## 2017-06-22 NOTE — Telephone Encounter (Signed)
I spoke to patient's daughter.  Patient's leg is weeping and she's having a hard time breathing.  [name] said she'll check on patient after work and take her to the ER if she feels patient needs to be sooner.  Cindy scheduled appointment on 06/24/17 at 2:00.

## 2017-06-23 ENCOUNTER — Observation Stay (HOSPITAL_BASED_OUTPATIENT_CLINIC_OR_DEPARTMENT_OTHER)
Admit: 2017-06-23 | Discharge: 2017-06-23 | Disposition: A | Discharge status: Home / Self Care | Payer: Medicare Other | Attending: Physician Assistant | Admitting: Physician Assistant

## 2017-06-23 ENCOUNTER — Other Ambulatory Visit: Payer: Self-pay

## 2017-06-23 ENCOUNTER — Emergency Department (HOSPITAL_COMMUNITY): Payer: Medicare Other

## 2017-06-23 ENCOUNTER — Observation Stay (HOSPITAL_BASED_OUTPATIENT_CLINIC_OR_DEPARTMENT_OTHER): Payer: Medicare Other

## 2017-06-23 ENCOUNTER — Inpatient Hospital Stay (HOSPITAL_COMMUNITY)
Admission: EM | Admit: 2017-06-23 | Discharge: 2017-06-28 | DRG: 176 | Disposition: A | Discharge status: Skilled Nursing Facility | Payer: Medicare Other | Attending: Internal Medicine | Admitting: Internal Medicine

## 2017-06-23 ENCOUNTER — Encounter (HOSPITAL_COMMUNITY): Payer: Self-pay | Admitting: Physician Assistant

## 2017-06-23 DIAGNOSIS — F39 Unspecified mood [affective] disorder: Secondary | ICD-10-CM | POA: Diagnosis present

## 2017-06-23 DIAGNOSIS — M159 Polyosteoarthritis, unspecified: Secondary | ICD-10-CM | POA: Diagnosis present

## 2017-06-23 DIAGNOSIS — I130 Hypertensive heart and chronic kidney disease with heart failure and stage 1 through stage 4 chronic kidney disease, or unspecified chronic kidney disease: Secondary | ICD-10-CM | POA: Diagnosis not present

## 2017-06-23 DIAGNOSIS — I361 Nonrheumatic tricuspid (valve) insufficiency: Secondary | ICD-10-CM | POA: Diagnosis not present

## 2017-06-23 DIAGNOSIS — N183 Chronic kidney disease, stage 3 unspecified: Secondary | ICD-10-CM | POA: Diagnosis present

## 2017-06-23 DIAGNOSIS — E781 Pure hyperglyceridemia: Secondary | ICD-10-CM | POA: Diagnosis present

## 2017-06-23 DIAGNOSIS — I2699 Other pulmonary embolism without acute cor pulmonale: Secondary | ICD-10-CM | POA: Diagnosis not present

## 2017-06-23 DIAGNOSIS — L8990 Pressure ulcer of unspecified site, unspecified stage: Secondary | ICD-10-CM

## 2017-06-23 DIAGNOSIS — I5032 Chronic diastolic (congestive) heart failure: Secondary | ICD-10-CM | POA: Diagnosis not present

## 2017-06-23 DIAGNOSIS — E538 Deficiency of other specified B group vitamins: Secondary | ICD-10-CM | POA: Diagnosis present

## 2017-06-23 DIAGNOSIS — M5432 Sciatica, left side: Secondary | ICD-10-CM | POA: Diagnosis not present

## 2017-06-23 DIAGNOSIS — M797 Fibromyalgia: Secondary | ICD-10-CM | POA: Diagnosis not present

## 2017-06-23 DIAGNOSIS — I5031 Acute diastolic (congestive) heart failure: Secondary | ICD-10-CM

## 2017-06-23 DIAGNOSIS — R7301 Impaired fasting glucose: Secondary | ICD-10-CM | POA: Diagnosis present

## 2017-06-23 DIAGNOSIS — I517 Cardiomegaly: Secondary | ICD-10-CM | POA: Diagnosis present

## 2017-06-23 DIAGNOSIS — E785 Hyperlipidemia, unspecified: Secondary | ICD-10-CM | POA: Diagnosis not present

## 2017-06-23 DIAGNOSIS — L89152 Pressure ulcer of sacral region, stage 2: Secondary | ICD-10-CM | POA: Diagnosis not present

## 2017-06-23 DIAGNOSIS — Z6841 Body Mass Index (BMI) 40.0 and over, adult: Secondary | ICD-10-CM

## 2017-06-23 DIAGNOSIS — I10 Essential (primary) hypertension: Secondary | ICD-10-CM

## 2017-06-23 DIAGNOSIS — Z888 Allergy status to other drugs, medicaments and biological substances status: Secondary | ICD-10-CM | POA: Diagnosis not present

## 2017-06-23 DIAGNOSIS — N1831 Chronic kidney disease, stage 3a: Secondary | ICD-10-CM | POA: Diagnosis present

## 2017-06-23 DIAGNOSIS — I872 Venous insufficiency (chronic) (peripheral): Secondary | ICD-10-CM | POA: Diagnosis present

## 2017-06-23 DIAGNOSIS — R52 Pain, unspecified: Secondary | ICD-10-CM

## 2017-06-23 DIAGNOSIS — L409 Psoriasis, unspecified: Secondary | ICD-10-CM | POA: Diagnosis present

## 2017-06-23 DIAGNOSIS — M5416 Radiculopathy, lumbar region: Secondary | ICD-10-CM | POA: Diagnosis present

## 2017-06-23 DIAGNOSIS — Z885 Allergy status to narcotic agent status: Secondary | ICD-10-CM | POA: Diagnosis not present

## 2017-06-23 HISTORY — DX: Other pulmonary embolism without acute cor pulmonale: I26.99

## 2017-06-23 LAB — URINALYSIS, ROUTINE W REFLEX MICROSCOPIC
Bilirubin Urine: NEGATIVE
Glucose, UA: NEGATIVE mg/dL
HGB URINE DIPSTICK: NEGATIVE
Ketones, ur: NEGATIVE mg/dL
Leukocytes, UA: NEGATIVE
Nitrite: NEGATIVE
PROTEIN: NEGATIVE mg/dL
Specific Gravity, Urine: 1.021 (ref 1.005–1.030)
pH: 5 (ref 5.0–8.0)

## 2017-06-23 LAB — PROTIME-INR
INR: 1.35
Prothrombin Time: 16.5 seconds — ABNORMAL HIGH (ref 11.4–15.2)

## 2017-06-23 LAB — HEMOGLOBIN A1C
HEMOGLOBIN A1C: 5.6 % (ref 4.8–5.6)
Mean Plasma Glucose: 114.02 mg/dL

## 2017-06-23 LAB — LIPID PANEL
CHOL/HDL RATIO: 4 ratio
Cholesterol: 177 mg/dL (ref 0–200)
HDL: 44 mg/dL (ref >40)
LDL CALC: 117 mg/dL — AB (ref 0–99)
Triglycerides: 80 mg/dL (ref <150)
VLDL: 16 mg/dL (ref 0–40)

## 2017-06-23 LAB — HEPARIN LEVEL (UNFRACTIONATED): Heparin Unfractionated: 0.43 IU/mL (ref 0.30–0.70)

## 2017-06-23 LAB — ECHOCARDIOGRAM COMPLETE

## 2017-06-23 LAB — I-STAT TROPONIN, ED: TROPONIN I, POC: 0 ng/mL (ref 0.00–0.08)

## 2017-06-23 LAB — BRAIN NATRIURETIC PEPTIDE: B NATRIURETIC PEPTIDE 5: 79.5 pg/mL (ref 0.0–100.0)

## 2017-06-23 LAB — D-DIMER, QUANTITATIVE: D-Dimer, Quant: 8.48 ug/mL-FEU — ABNORMAL HIGH (ref 0.00–0.50)

## 2017-06-23 LAB — CBG MONITORING, ED: Glucose-Capillary: 113 mg/dL — ABNORMAL HIGH (ref 65–99)

## 2017-06-23 MED ORDER — BISACODYL 10 MG RE SUPP: 10.0000 mg | Freq: Every day | RECTAL | Status: DC | PRN

## 2017-06-23 MED ORDER — IOPAMIDOL (ISOVUE-370) INJECTION 76%
INTRAVENOUS | Status: AC
  Administered 2017-06-23: 100 mL
  Filled 2017-06-23: qty 100

## 2017-06-23 MED ORDER — NITROGLYCERIN 2 % TD OINT
0.5000 [in_us] | TOPICAL_OINTMENT | Freq: Once | TRANSDERMAL | Status: AC
Start: 2017-06-23 — End: 2017-06-23
  Administered 2017-06-23: 0.5 [in_us] via TOPICAL
  Filled 2017-06-23: qty 1

## 2017-06-23 MED ORDER — POTASSIUM CHLORIDE CRYS ER 20 MEQ PO TBCR
40.0000 meq | EXTENDED_RELEASE_TABLET | Freq: Every day | ORAL | Status: DC
  Administered 2017-06-23 – 2017-06-28 (×6): 40 meq via ORAL
  Filled 2017-06-23 (×6): qty 2

## 2017-06-23 MED ORDER — FUROSEMIDE 10 MG/ML IJ SOLN
40.0000 mg | Freq: Once | INTRAMUSCULAR | Status: AC
  Administered 2017-06-23: 40 mg via INTRAVENOUS
  Filled 2017-06-23: qty 4

## 2017-06-23 MED ORDER — ONDANSETRON HCL 4 MG/2ML IJ SOLN: 4.0000 mg | Freq: Four times a day (QID) | INTRAMUSCULAR | Status: DC | PRN

## 2017-06-23 MED ORDER — SENNOSIDES-DOCUSATE SODIUM 8.6-50 MG PO TABS
1.0000 | ORAL_TABLET | Freq: Every evening | ORAL | Status: DC | PRN
  Administered 2017-06-26 – 2017-06-27 (×2): 1 via ORAL
  Filled 2017-06-23 (×2): qty 1

## 2017-06-23 MED ORDER — ACETAMINOPHEN 325 MG PO TABS
650.0000 mg | ORAL_TABLET | Freq: Once | ORAL | Status: AC
  Administered 2017-06-23: 650 mg via ORAL
  Filled 2017-06-23: qty 2

## 2017-06-23 MED ORDER — HEPARIN BOLUS VIA INFUSION
4900.0000 [IU] | Freq: Once | INTRAVENOUS | Status: AC
  Administered 2017-06-23: 4900 [IU] via INTRAVENOUS
  Filled 2017-06-23: qty 4900

## 2017-06-23 MED ORDER — ACETAMINOPHEN 650 MG RE SUPP: 650.0000 mg | Freq: Four times a day (QID) | RECTAL | Status: DC | PRN

## 2017-06-23 MED ORDER — TRIAMTERENE-HCTZ 37.5-25 MG PO TABS
1.0000 | ORAL_TABLET | Freq: Every day | ORAL | Status: DC
  Administered 2017-06-23 – 2017-06-28 (×6): 1 via ORAL
  Filled 2017-06-23 (×6): qty 1

## 2017-06-23 MED ORDER — ACETAMINOPHEN 325 MG PO TABS
650.0000 mg | ORAL_TABLET | Freq: Four times a day (QID) | ORAL | Status: DC | PRN
  Administered 2017-06-23 – 2017-06-24 (×3): 650 mg via ORAL
  Filled 2017-06-23 (×3): qty 2

## 2017-06-23 MED ORDER — HEPARIN (PORCINE) IN NACL 100-0.45 UNIT/ML-% IJ SOLN
1600.0000 [IU]/h | INTRAMUSCULAR | Status: DC
  Administered 2017-06-23: 1400 [IU]/h via INTRAVENOUS
  Administered 2017-06-24: 1600 [IU]/h via INTRAVENOUS
  Administered 2017-06-24: 1400 [IU]/h via INTRAVENOUS
  Filled 2017-06-23 (×3): qty 250

## 2017-06-23 MED ORDER — ONDANSETRON HCL 4 MG PO TABS
4.0000 mg | ORAL_TABLET | Freq: Four times a day (QID) | ORAL | Status: DC | PRN
Start: 2017-06-23 — End: 2017-06-28

## 2017-06-23 NOTE — H&P (Signed)
History and Physical    Chelsea Keller WFU:[id number] DOB: 11/22/1934 DOA: 06/23/2017   PCP: Venia Carbon, MD   Patient coming from:  Home    Chief Complaint: Shortness of breath  HPI: Chelsea Keller is a 82 y.o. female with medical history significant for hypertension, hyperlipidemia, morbid obesity, chronic lower extremity swelling, congestive heart failure, presenting with 1 week of worsening shortness of breath, associated with worsening bilateral lower extremity swelling. The patient reports that she always has swelling in her legs, which limits her mobility, and has chronic "redness in her legs ", although over the last week, the swelling has worsened, and she reports bilateral right greater than left calf pain. In addition, she has reported increasing fatigue, with limited mobility. Her shortness of breath was especially extraction, improving with rest, and is accompanied by intermittent pain on the left side of her chest, reproducible. No chest pain at this time. She denies any fevers, chills, or hemoptysis.  Denies pre-syncopal episodes, palpitations or hemoptysis  Denies history of cancer. Denies tobacco use. Patient denies taking hormone replacement therapy. Denies a history or family history of peripartum thromboembolic event or history of recurrent miscarriages.  Never had a hematological evaluation prior to this admission or bone marrow biopsy. No recent long distance trips. Denies any recent infections or sick contacts. Of note, her daughter has been diagnosed with PE as well, status post filter, these occurring during her pregnancy. No other family history of PE, DVT or strokes.  .   ED Course:  BP (!) 170/85   Pulse 79   Temp 98.2 F (36.8 C) (Oral)   Resp (!) 21   Ht 5' 2"  (1.575 m)   Wt 123.4 kg (272 lb)   SpO2 92%   BMI 49.75 kg/m    BNP 79.5 D-dimer 8.48 Troponin is negative  white count 10.9 hemoglobin 11.2 glucose 106 EKG normal sinus rhythm Chest x-ray  cardiomegaly with vascular congestion, no edema or pneumonia   given Lasix 40 mg IV 1   received Nitropaste   patient reporting per pharmacy CT and you shows extensive bilateral PE without right heart strain   Review of Systems: As per HPI otherwise 10 point review of systems negative.   Past Medical History:  Diagnosis Date  . Allergy   . Anemia   . Anxiety   . Arthritis   . Chronic diastolic CHF (congestive heart failure) (HCC)    a. Echo 1/16:  mild LVH, EF normal, grade 1 DD, MAC  . Chronic venous insufficiency    chronic LE edema  . Depression   . Fibromyalgia    constant pain  . Hx of cardiac catheterization    a. LHC in [place] "ok" per patient with mild plaque in a single vessel - records not available  . Hx of cardiovascular stress test    a. Nuclear study in 2008 normal  . Hx of colonic polyps   . Hypertension   . Hypertriglyceridemia   . Impaired fasting glucose   . PONV (postoperative nausea and vomiting)   . PUD (peptic ulcer disease)    hx of gastric ulcer  . Vitamin B12 deficiency     Past Surgical History:  Procedure Laterality Date  . ABDOMINAL HYSTERECTOMY    . CATARACT EXTRACTION  10/2003   OD  . CHOLECYSTECTOMY    . COLONOSCOPY W/ POLYPECTOMY    . COLONOSCOPY WITH PROPOFOL N/A 10/15/2014   Procedure: COLONOSCOPY WITH PROPOFOL;  Surgeon: Ladene Artist, MD;  Location: [facility];  Service: Endoscopy;  Laterality: N/A;  . ESOPHAGOGASTRODUODENOSCOPY (EGD) WITH PROPOFOL N/A 10/15/2014   Procedure: ESOPHAGOGASTRODUODENOSCOPY (EGD) WITH PROPOFOL;  Surgeon: Ladene Artist, MD;  Location: [facility];  Service: Endoscopy;  Laterality: N/A;  . EXTERNAL FIXATION ANKLE FRACTURE     Fx.  left ankle-fixation with pins later removed sec to infection 1985  . EXTERNAL FIXATION WRIST FRACTURE  1985   left with pins  . EYE SURGERY    . FEMUR FRACTURE SURGERY  06/2009  . FRACTURE SURGERY    . HARDWARE REMOVAL Left 10/05/2013   Procedure: HARDWARE REMOVAL LEFT  DISTAL FEMUR;  Surgeon: Rozanna Box, MD;  Location: [facility];  Service: Orthopedics;  Laterality: Left;  . HEMIARTHROPLASTY SHOULDER FRACTURE  06/2009  . JOINT REPLACEMENT    . STERIOD INJECTION Right 10/05/2013   Procedure: STEROID INJECTION;  Surgeon: Rozanna Box, MD;  Location: [facility];  Service: Orthopedics;  Laterality: Right;  . TONSILLECTOMY    . TONSILLECTOMY    . TOTAL KNEE ARTHROPLASTY  03/09   left    Social History Social History   Socioeconomic History  . Marital status: Widowed    Spouse name: Not on file  . Number of children: 4  . Years of education: Not on file  . Highest education level: Not on file  Social Needs  . Financial resource strain: Not on file  . Food insecurity - worry: Not on file  . Food insecurity - inability: Not on file  . Transportation needs - medical: Not on file  . Transportation needs - non-medical: Not on file  Occupational History  . Occupation: retired crossing guard  . 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 Smoking status: Former Smoker    Packs/day: 1.00    Years: 40.00    Pack years: 40.00    Types: Cigarettes    Last attempt to quit: 07/06/1993    Years since quitting: 23.9  . Smokeless tobacco: Never Used  Substance and Sexual Activity  . Alcohol use: No    Alcohol/week: 0.0 oz  . Drug use: No  . Sexual activity: Not on file  Other Topics Concern  . Not on file  Social History Narrative   No living will   Plans to do health care POA forms---wants daughter [name]   Would accept resuscitation attempts---no prolonged ventilation   Absolutely no feeding tube     Allergies  Allergen Reactions  . Codeine Sulfate Shortness Of Breath  . Celecoxib Other (See Comments)    Pt bled out  . Erythromycin Base Nausea And Vomiting    Family History  Problem Relation Age of Onset  . Depression Mother   . Cancer Mother        uterine cancer  . Heart attack Father   . Cancer Brother        prostate  cancer  . Diabetes Maternal Aunt   . Arthritis Brother   . Asthma Brother   . Stroke Maternal Grandmother       Prior to Admission medications   Medication Sig Start Date End Date Taking? Authorizing Provider  acetaminophen (TYLENOL) 500 MG tablet Take 1,000 mg by mouth at bedtime as needed for mild pain or moderate pain.    Yes [provider]  potassium chloride SA (K-DUR,KLOR-CON) 20 MEQ tablet TAKE 2 TABLETS (40 MEQ TOTAL) DAILY 06/10/17  Yes Venia Carbon, MD  triamterene-hydrochlorothiazide (MAXZIDE-25) 37.5-25 MG tablet Take  1 tablet by mouth daily. 07/27/16  Yes Burnell Blanks, MD  fluconazole (DIFLUCAN) 100 MG tablet Take 2 tablets (200 mg total) by mouth once a week. Patient not taking: Reported on 06/22/2017 06/01/17   Ria Bush, MD  oxyCODONE-acetaminophen (PERCOCET/ROXICET) 5-325 MG tablet Take 0.5-1 tablets by mouth every 4 (four) hours as needed for moderate pain or severe pain. Patient not taking: Reported on 06/22/2017 04/05/17   Nita Sells, MD  senna (SENOKOT) 8.6 MG TABS tablet Take 1 tablet (8.6 mg total) by mouth daily. Patient not taking: Reported on 06/22/2017 04/05/17   Nita Sells, MD  sulfamethoxazole-trimethoprim (BACTRIM DS,SEPTRA DS) 800-160 MG tablet Take 1 tablet by mouth 2 (two) times daily. Patient not taking: Reported on 06/22/2017 06/01/17   Ria Bush, MD    Physical Exam:  Vitals:   06/23/17 0745 06/23/17 0830 06/23/17 0900 06/23/17 0930  BP:  (!) 168/63 (!) 147/82 (!) 170/85  Pulse:  88 82 79  Resp: (!) 21 18 (!) 22 (!) 21  Temp:      TempSrc:      SpO2:  100% 95% 92%  Weight:      Height:       Constitutional: NAD, appears comfortable, however she is anxious.  Eyes: PERRL, lids and conjunctivae normal ENMT: Mucous membranes are moist, without exudate or lesions  Neck: normal, supple, no masses, no thyromegaly Respiratory: clear to auscultation bilaterally, no wheezing, no crackles.  Normal respiratory effort  Cardiovascular: Tachycardia rate and rhythm with occasional ectopic beats, no murmurs, rubs or gallops. Chronic bilateral 2+ lower extremity edema, with chronic erythematosus changes.. 2+ pedal pulses. No carotid bruits.  Abdomen: Soft, non tender, morbidly obese No hepatosplenomegaly. Bowel sounds positive.  Musculoskeletal: no clubbing / cyanosis. Moves all extremities Skin: no jaundice, No lesions.  Neurologic: Sensation intact  Strength equal in all extremities Psychiatric:   Alert and oriented x 3. Anxious mood.     Labs on Admission: I have personally reviewed following labs and imaging studies  CBC: Recent Labs  Lab 06/22/17 2016  WBC 10.9*  HGB 11.2*  HCT 35.9*  MCV 85.5  PLT 937    Basic Metabolic Panel: Recent Labs  Lab 06/22/17 2016  NA 139  K 3.7  CL 104  CO2 27  GLUCOSE 106*  BUN 13  CREATININE 1.00  CALCIUM 9.3    GFR: Estimated Creatinine Clearance: 56.2 mL/min (by C-G formula based on SCr of 1 mg/dL).  Liver Function Tests: No results for input(s): AST, ALT, ALKPHOS, BILITOT, PROT, ALBUMIN in the last 168 hours. No results for input(s): LIPASE, AMYLASE in the last 168 hours. No results for input(s): AMMONIA in the last 168 hours.  Coagulation Profile: No results for input(s): INR, PROTIME in the last 168 hours.  Cardiac Enzymes: No results for input(s): CKTOTAL, CKMB, CKMBINDEX, TROPONINI in the last 168 hours.  BNP (last 3 results) No results for input(s): PROBNP in the last 8760 hours.  HbA1C: No results for input(s): HGBA1C in the last 72 hours.  CBG: Recent Labs  Lab 06/23/17 0942  GLUCAP 113*    Lipid Profile: No results for input(s): CHOL, HDL, LDLCALC, TRIG, CHOLHDL, LDLDIRECT in the last 72 hours.  Thyroid Function Tests: No results for input(s): TSH, T4TOTAL, FREET4, T3FREE, THYROIDAB in the last 72 hours.  Anemia Panel: No results for input(s): VITAMINB12, FOLATE, FERRITIN, TIBC, IRON,  RETICCTPCT in the last 72 hours.  Urine analysis:    Component Value Date/Time   COLORURINE YELLOW 04/03/2017  [place] 04/03/2017 1110   LABSPEC 1.017 04/03/2017 1110   PHURINE 5.0 04/03/2017 1110   GLUCOSEU NEGATIVE 04/03/2017 1110   HGBUR NEGATIVE 04/03/2017 1110   HGBUR negative 01/13/2010 1056   BILIRUBINUR NEGATIVE 04/03/2017 1110   BILIRUBINUR negative 02/11/2017 1709   KETONESUR NEGATIVE 04/03/2017 1110   PROTEINUR NEGATIVE 04/03/2017 1110   UROBILINOGEN 0.2 02/11/2017 1709   UROBILINOGEN 0.2 01/13/2010 1056   NITRITE NEGATIVE 04/03/2017 1110   LEUKOCYTESUR SMALL (A) 04/03/2017 1110    Sepsis Labs: @LABRCNTIP (procalcitonin:4,lacticidven:4) )No results found for this or any previous visit (from the past 240 hour(s)).   Radiological Exams on Admission: Dg Chest 2 View  Result Date: 06/22/2017 CLINICAL DATA:  Shortness of breath EXAM: CHEST  2 VIEW COMPARISON:  09/04/2014 FINDINGS: Chronic cardiomegaly. Prominent main pulmonary artery contour. Vascular congestion. No Kerley lines or effusion. No air bronchogram or pneumothorax. IMPRESSION: Cardiomegaly and vascular congestion.  No edema or pneumonia. Electronically Signed   By: Monte Fantasia M.D.   On: 06/22/2017 21:00   Ct Angio Chest Pe W And/or Wo Contrast  Result Date: 06/23/2017 CLINICAL DATA:  Short of breath EXAM: CT ANGIOGRAPHY CHEST WITH CONTRAST TECHNIQUE: Multidetector CT imaging of the chest was performed using the standard protocol during bolus administration of intravenous contrast. Multiplanar CT image reconstructions and MIPs were obtained to evaluate the vascular anatomy. CONTRAST:  121m ISOVUE-370 IOPAMIDOL (ISOVUE-370) INJECTION 76% COMPARISON:  06/22/2017 FINDINGS: Cardiovascular: Extensive pulmonary embolism. Large volume pulmonary embolism with clot in the upper and lower lobes bilaterally. Pulmonary arteries mildly enlarged. RV/LV ratio 0.8. Negative for right heart strain. Coronary  calcification. Atherosclerotic calcification aortic arch without aneurysm or dissection. Heart size upper normal. Mediastinum/Nodes: Negative Lungs/Pleura: Lungs are clear.  No effusion. Upper Abdomen: Negative Musculoskeletal: Negative Review of the MIP images confirms the above findings. IMPRESSION: Extensive bilateral pulmonary embolism without right heart strain. The lungs are clear Aortic Atherosclerosis (ICD10-I70.0). These results were called by telephone at the time of interpretation on 06/23/2017 at 8:42 am to Dr. PJohnney Killian who verbally acknowledged these results. Electronically Signed   By: CFranchot GalloM.D.   On: 06/23/2017 08:43    EKG: Independently reviewed.  Assessment/Plan Active Problems:   Bilateral pulmonary embolism (HCC)   Essential hypertension   VENTRICULAR HYPERTROPHY, LEFT   Venous (peripheral) insufficiency   Osteoarthritis, multiple sites   Impaired fasting glucose   BMI 50.0-59.9, adult (HCC)   Psoriasis   Mood disorder (HCC)   Chronic diastolic congestive heart failure (HCC)   Chronic diastolic CHF (congestive heart failure) (HCC)   Hypertriglyceridemia   Vitamin B12 deficiency   CKD (chronic kidney disease) stage 3, GFR 30-59 ml/min (HCC)   Left sided sciatica     Pulmonary Embolism, Bilateral. Admitted w SOB without desating on RA. Abnormal CTA with  PE without  R heart strain, no edema or pneumonia Tn neg. EKG SR no apparent ACS. D-dimer on presentation 8.48. White count 10.9. BNP 79.5 Placed on Heparin drip by pharmacy. She also received one-time Nitropaste, due to her initial chest pain, currently chest pain free. Risk factors include morbid obesity, hypertension, hypercholesterolemia, family history of PE, decreased mobility Admit to tele obs O2 as needed Pharmacy consult to manage Heparin drip; transition to oral anticoagulation in am as per pharmacy Bilateral LE UKoreato rule out DVT 2 D Echo    Hypertension BP  168/70   Pulse  105 /chronic  diastolic heart failure. BMP is normal, weight 272 pounds Continue home  diuretics, as the patient is reluctant to utilize at the blockers 2-D echo is pending, as above   Hyperlipidemia Patient not on home medications prior to admission Check lipid panel    CK D, stage III, GFR 30-59, currently at 1, no acute issues. The patient produces good amount of urine. No indication to hold her diuretics at this time. Continue to monitor, chemistries in a.m.  Chronic left hip pain and fibromyalgia, left-sided sciatica with decreased mobility. She had a recent hospitalization in October for the same. Ultram when necessary, continue Tylenol  The patient is unable to use NSAIDs due to history of sick right ear PT and OT evaluation when able to mobilize  History of anemia, and B-12 deficiency, current hemoglobin is 11.2, stable from prior at 11.3, no acute changes.  No intervention is indicated at this time.  Repeat CBC in a.m.     DVT prophylaxis heparin drip per pharmacy, will be transitioned to oral anticoagulant prior to discharge Code Status:   Full     Family Communication:  Discussed with patient Disposition Plan: Expect patient to be discharged to home after condition improves Consults called:  Pharmacy   Admission status:Tele  Obs   Sharene Butters, PA-C [facility]   06/23/2017, 10:04 AM

## 2017-06-23 NOTE — ED Notes (Signed)
[facility] lunch tray ordered at 1031 

## 2017-06-23 NOTE — Progress Notes (Signed)
  Echocardiogram 2D Echocardiogram has been performed.  Jennette Dubin 06/23/2017, 11:28 AM

## 2017-06-23 NOTE — Progress Notes (Signed)
ANTICOAGULATION CONSULT NOTE - Follow Up Consult  Pharmacy Consult for Heparin Indication: pulmonary embolus  Patient Measurements: Height: 5\' 2"  (157.5 cm) Weight: 265 lb 14 oz (120.6 kg) IBW/kg (Calculated) : 50.1 Heparin Dosing Weight: 80.9kg  Labs: Recent Labs    06/22/17 2016 06/23/17 1020 06/23/17 1811  HGB 11.2*  --   --   HCT 35.9*  --   --   PLT 281  --   --   LABPROT  --  16.5*  --   INR  --  1.35  --   HEPARINUNFRC  --   --  0.43  CREATININE 1.00  --   --    Estimated Creatinine Clearance: 55.5 mL/min (by C-G formula based on SCr of 1 mg/dL).  Assessment: Chelsea Keller is an 82 y/o F who presented with SOB and was diagnosed with a pulmonary embolism. Pharmacy was consulted to start heparin and discuss with the patient oral anticoagulation. CT angio shows PE negative for right heart strain, BP elevated, trops currently negative, renal function stable w/SCr of 1.  Initial HL is therapeutic at 0.43 on 1400 units/hr. RN reports no s/s of bleeding.    Goal of Therapy:  Heparin level 0.3-0.7 units/ml Monitor platelets by anticoagulation protocol: Yes   Plan:  Continue Heparin gtt at 1400 units/hr HL in 8hrs Monitor for s/sx of bleeding  Albertina Parr, PharmD., BCPS Clinical Pharmacist Pager [phone]

## 2017-06-23 NOTE — ED Notes (Signed)
Patient transported to Ultrasound 

## 2017-06-23 NOTE — Telephone Encounter (Signed)
okay

## 2017-06-23 NOTE — ED Notes (Signed)
Meal tray ordered 

## 2017-06-23 NOTE — ED Notes (Signed)
Chelsea Keller, admitting,  at bedside.

## 2017-06-23 NOTE — ED Provider Notes (Signed)
[facility] EMERGENCY DEPARTMENT Provider Note   CSN: [id number] Arrival date & time: 06/22/17  1926     History   Chief Complaint Chief Complaint  Patient presents with  . Shortness of Breath    HPI Chelsea Keller is a 82 y.o. female.  The history is provided by the patient and a relative.  Shortness of Breath  This is a new problem. The average episode lasts 1 week. The problem occurs frequently.The problem has been gradually worsening. Associated symptoms include leg swelling. Pertinent negatives include no fever, no hemoptysis and no abdominal pain. Treatments tried: Rest. The treatment provided mild relief. Associated medical issues include heart failure.  Patient with history of obesity, chronic lower extremity edema, chronic diastolic CHF presents with increasing shortness of breath for the past week She reports dyspnea on exertion every day and improves with rest No fevers or vomiting, no hemoptysis reported She reports brief intermittent episodes of right-sided pain at times, but no acute chest pain at this time, and no pleuritic chest pain currently Reports her lower extremity edema is unchanged  Past Medical History:  Diagnosis Date  . Allergy   . Anemia   . Anxiety   . Arthritis   . Chronic diastolic CHF (congestive heart failure) (HCC)    a. Echo 1/16:  mild LVH, EF normal, grade 1 DD, MAC  . Chronic venous insufficiency    chronic LE edema  . Depression   . Fibromyalgia    constant pain  . Hx of cardiac catheterization    a. LHC in [place] "ok" per patient with mild plaque in a single vessel - records not available  . Hx of cardiovascular stress test    a. Nuclear study in 2008 normal  . Hx of colonic polyps   . Hypertension   . Hypertriglyceridemia   . Impaired fasting glucose   . PONV (postoperative nausea and vomiting)   . PUD (peptic ulcer disease)    hx of gastric ulcer  . Vitamin B12 deficiency     Patient Active Problem List   Diagnosis Date Noted  . Right sided abdominal pain 06/01/2017  . Pain 04/04/2017  . Acute kidney injury ([place]) 04/03/2017  . Intractable pain-left hip 04/03/2017  . Fibromyalgia 04/03/2017  . Anemia 04/03/2017  . Chronic diastolic CHF (congestive heart failure) ([place]) 04/03/2017  . Hypertension 04/03/2017  . Hypertriglyceridemia 04/03/2017  . Vitamin B12 deficiency 04/03/2017  . CKD (chronic kidney disease) stage 3, GFR 30-59 ml/min (HCC) 04/03/2017  . Acute hypokalemia 04/03/2017  . Left sided sciatica   . LLQ pain 02/11/2017  . Advance directive discussed with patient 01/19/2017  . Chronic diastolic congestive heart failure ([place]) 05/09/2014  . Mood disorder ([place]) 11/03/2013  . Psoriasis 02/21/2013  . Routine general medical examination at a health care facility 06/14/2012  . BMI 50.0-59.9, adult ([place]) 12/02/2011  . VENTRICULAR HYPERTROPHY, LEFT 10/19/2008  . Sleep disturbance 10/25/2007  . Venous (peripheral) insufficiency 08/11/2007  . Osteoarthritis, multiple sites 08/11/2007  . IMPAIRED FASTING GLUCOSE 08/11/2007  . COLONIC POLYPS, HX OF 05/20/2007  . B12 DEFICIENCY 09/30/2006  . Essential hypertension 09/30/2006  . Allergic rhinitis due to pollen 09/30/2006    Past Surgical History:  Procedure Laterality Date  . ABDOMINAL HYSTERECTOMY    . CATARACT EXTRACTION  10/2003   OD  . CHOLECYSTECTOMY    . COLONOSCOPY W/ POLYPECTOMY    . COLONOSCOPY WITH PROPOFOL N/A 10/15/2014   Procedure: COLONOSCOPY WITH PROPOFOL;  Surgeon: Pricilla Riffle  Fuller Plan, MD;  Location: [facility];  Service: Endoscopy;  Laterality: N/A;  . ESOPHAGOGASTRODUODENOSCOPY (EGD) WITH PROPOFOL N/A 10/15/2014   Procedure: ESOPHAGOGASTRODUODENOSCOPY (EGD) WITH PROPOFOL;  Surgeon: Ladene Artist, MD;  Location: [facility];  Service: Endoscopy;  Laterality: N/A;  . EXTERNAL FIXATION ANKLE FRACTURE     Fx.  left ankle-fixation with pins later removed sec to infection 1985  . EXTERNAL FIXATION WRIST FRACTURE  1985    left with pins  . EYE SURGERY    . FEMUR FRACTURE SURGERY  06/2009  . FRACTURE SURGERY    . HARDWARE REMOVAL Left 10/05/2013   Procedure: HARDWARE REMOVAL LEFT DISTAL FEMUR;  Surgeon: Rozanna Box, MD;  Location: [facility];  Service: Orthopedics;  Laterality: Left;  . HEMIARTHROPLASTY SHOULDER FRACTURE  06/2009  . JOINT REPLACEMENT    . STERIOD INJECTION Right 10/05/2013   Procedure: STEROID INJECTION;  Surgeon: Rozanna Box, MD;  Location: [facility];  Service: Orthopedics;  Laterality: Right;  . TONSILLECTOMY    . TONSILLECTOMY    . TOTAL KNEE ARTHROPLASTY  03/09   left    OB History    No data available       Home Medications    Prior to Admission medications   Medication Sig Start Date End Date Taking? Authorizing Provider  acetaminophen (TYLENOL) 500 MG tablet Take 1,000 mg by mouth at bedtime as needed for mild pain or moderate pain.    Yes [provider]  potassium chloride SA (K-DUR,KLOR-CON) 20 MEQ tablet TAKE 2 TABLETS (40 MEQ TOTAL) DAILY 06/10/17  Yes Venia Carbon, MD  triamterene-hydrochlorothiazide (MAXZIDE-25) 37.5-25 MG tablet Take 1 tablet by mouth daily. 07/27/16  Yes Burnell Blanks, MD  fluconazole (DIFLUCAN) 100 MG tablet Take 2 tablets (200 mg total) by mouth once a week. Patient not taking: Reported on 06/22/2017 06/01/17   Ria Bush, MD  oxyCODONE-acetaminophen (PERCOCET/ROXICET) 5-325 MG tablet Take 0.5-1 tablets by mouth every 4 (four) hours as needed for moderate pain or severe pain. Patient not taking: Reported on 06/22/2017 04/05/17   Nita Sells, MD  senna (SENOKOT) 8.6 MG TABS tablet Take 1 tablet (8.6 mg total) by mouth daily. Patient not taking: Reported on 06/22/2017 04/05/17   Nita Sells, MD  sulfamethoxazole-trimethoprim (BACTRIM DS,SEPTRA DS) 800-160 MG tablet Take 1 tablet by mouth 2 (two) times daily. Patient not taking: Reported on 06/22/2017 06/01/17   Ria Bush, MD    Family History Family  History  Problem Relation Age of Onset  . Depression Mother   . Cancer Mother        uterine cancer  . Heart attack Father   . Cancer Brother        prostate cancer  . Diabetes Maternal Aunt   . Arthritis Brother   . Asthma Brother   . Stroke Maternal Grandmother     Social History Social History   Tobacco Use  . Smoking status: Former Smoker    Packs/day: 1.00    Years: 40.00    Pack years: 40.00    Types: Cigarettes    Last attempt to quit: 07/06/1993    Years since quitting: 23.9  . Smokeless tobacco: Never Used  Substance Use Topics  . Alcohol use: No    Alcohol/week: 0.0 oz  . Drug use: No     Allergies   Codeine sulfate; Celecoxib; and Erythromycin base   Review of Systems Review of Systems  Constitutional: Positive for fatigue. Negative for fever.  Respiratory: Positive for  shortness of breath. Negative for hemoptysis.   Cardiovascular: Positive for leg swelling.  Gastrointestinal: Negative for abdominal pain.  All other systems reviewed and are negative.    Physical Exam Updated Vital Signs BP (!) 151/66   Pulse 93   Temp 98.2 F (36.8 C) (Oral)   Resp (!) 22   Ht 1.575 m (_0 )   Wt 123.4 kg (272 lb)   SpO2 96%   BMI 49.75 kg/m   Physical Exam CONSTITUTIONAL: Elderly, no acute distress HEAD: Normocephalic/atraumatic EYES: EOMI ENMT: Mucous membranes moist NECK: supple no meningeal signs SPINE/BACK:entire spine nontender CV: S1/S2 noted LUNGS: Upper lung fields are clear, lower lung fields are obscured and limited due to body size ABDOMEN: soft, nontender, obese GU:no cva tenderness NEURO: Pt is awake/alert/appropriate, moves all extremitiesx4.  No facial droop.   EXTREMITIES: pulses normal/equal, full ROM, chronic appearing lower extremity edema noted in bilateral lower extremities SKIN: warm, color normal PSYCH: no abnormalities of mood noted, alert and oriented to situation   ED Treatments / Results  Labs (all labs ordered are  listed, but only abnormal results are displayed) Labs Reviewed  BASIC METABOLIC PANEL - Abnormal; Notable for the following components:      Result Value   Glucose, Bld 106 (*)    GFR calc non Af Amer 52 (*)    All other components within normal limits  CBC - Abnormal; Notable for the following components:   WBC 10.9 (*)    Hemoglobin 11.2 (*)    HCT 35.9 (*)    RDW 16.1 (*)    All other components within normal limits  D-DIMER, QUANTITATIVE (NOT AT [facility]) - Abnormal; Notable for the following components:   D-Dimer, Quant 8.48 (*)    All other components within normal limits  BRAIN NATRIURETIC PEPTIDE  I-STAT TROPONIN, ED  I-STAT TROPONIN, ED    EKG  EKG Interpretation  Date/Time:  Tuesday June 22 2017 19:50:32 EST Ventricular Rate:  82 PR Interval:  164 QRS Duration: 84 QT Interval:  400 QTC Calculation: 467 R Axis:   -3 Text Interpretation:  Normal sinus rhythm Low voltage QRS Borderline ECG Confirmed by Ripley Fraise [phone]) on 06/23/2017 4:31:22 AM       Radiology Dg Chest 2 View  Result Date: 06/22/2017 CLINICAL DATA:  Shortness of breath EXAM: CHEST  2 VIEW COMPARISON:  09/04/2014 FINDINGS: Chronic cardiomegaly. Prominent main pulmonary artery contour. Vascular congestion. No Kerley lines or effusion. No air bronchogram or pneumothorax. IMPRESSION: Cardiomegaly and vascular congestion.  No edema or pneumonia. Electronically Signed   By: Monte Fantasia M.D.   On: 06/22/2017 21:00    Procedures Procedures (including critical care time)  Medications Ordered in ED Medications  acetaminophen (TYLENOL) tablet 650 mg (not administered)  nitroGLYCERIN (NITROGLYN) 2 % ointment 0.5 inch (0.5 inches Topical Given 06/23/17 0539)  furosemide (LASIX) injection 40 mg (40 mg Intravenous Given 06/23/17 0541)     Initial Impression / Assessment and Plan / ED Course  I have reviewed the triage vital signs and the nursing notes.  Pertinent labs & imaging results that  were available during my care of the patient were reviewed by me and considered in my medical decision making (see chart for details).     Initial concern for CHF. But BNP is not elevated and no convincing signs of edema or CXR ddimer elevated, will need CT chest She will then require admission 8:10 AM Signed out to dr pfeifer with CT chest pending, then  will need admitted   Final Clinical Impressions(s) / ED Diagnoses   Final diagnoses:  Acute diastolic congestive heart failure [facility])    ED Discharge Orders    None       Ripley Fraise, MD 06/23/17 [phone]

## 2017-06-23 NOTE — ED Notes (Signed)
IV in [facility] continuing to alarm.  Pt becoming frustrated.  Has agreed to allow RN to place new IV.  No respiratory distress noted.

## 2017-06-23 NOTE — Progress Notes (Signed)
LE venous duplex prelim: negative for DVT. Novah Nessel Eunice, RDMS, RVT  

## 2017-06-23 NOTE — Progress Notes (Signed)
ANTICOAGULATION CONSULT NOTE - Initial Consult  Pharmacy Consult for Heparin Indication: pulmonary embolus  Patient Measurements: Height: 5\' 2"  (157.5 cm) Weight: 272 lb (123.4 kg) IBW/kg (Calculated) : 50.1 Heparin Dosing Weight: 80.9kg  Labs: Recent Labs    06/22/17 2016  HGB 11.2*  HCT 35.9*  PLT 281  CREATININE 1.00   Estimated Creatinine Clearance: 56.2 mL/min (by C-G formula based on SCr of 1 mg/dL).  Assessment: Chelsea Keller is an 82 y/o F who presented with SOB and was diagnosed with a pulmonary embolism. Pharmacy was consulted to start heparin and discuss with the patient oral anticoagulation. CT angio shows PE negative for right heart strain, BP elevated, trops currently negative, renal function stable w/SCr of 1.  Discussed with Chelsea Keller warfarin, apixaban, and rivaroxaban. We reviewed side effects, monitoring, and how to take. She denies history of anticoagulation. Since she only takes two daily medications, I believe rivaroxaban would be an appropriate once daily option. Case management was consulted to evaluate cost.   Goal of Therapy:  Heparin level 0.3-0.7 units/ml Monitor platelets by anticoagulation protocol: Yes   Plan:  Heparin Bolus 4900 units x1 Heparin gtt at 1400 units/hr HL in 8hrs Monitor for s/sx of bleeding  Patterson Hammersmith PharmD PGY1 Pharmacy Practice Resident 06/23/2017 10:10 AM Pager: [phone]

## 2017-06-23 NOTE — Telephone Encounter (Signed)
She is in the ER now If she is admitted, we should cancel 12/20---otherwise keep on the books

## 2017-06-23 NOTE — Telephone Encounter (Signed)
When I spoke to patient's daughter, I let her know we'd keep the appointment on Thursday, even if she goes to the ER, so if they need a follow up from the ER she'd have an appointment.  Her daughter said she'll call to cancel the appointment if they don't need it.

## 2017-06-23 NOTE — ED Provider Notes (Signed)
Radiologist has returned read positive for PE without right heart strain.  Patient is updated on diagnosis.  She is alert and appropriate.  No respiratory distress at rest.  Vital signs stable.  Heparin per pharmacy protocol for PE ordered.  Will consult [facility] hospitalist for admission.   Charlesetta Shanks, MD 06/23/17 [phone]

## 2017-06-23 NOTE — ED Notes (Signed)
Patient transported to CT 

## 2017-06-24 ENCOUNTER — Ambulatory Visit: Payer: Medicare Other | Admitting: Physician Assistant

## 2017-06-24 ENCOUNTER — Ambulatory Visit: Payer: Medicare Other | Admitting: Internal Medicine

## 2017-06-24 ENCOUNTER — Observation Stay (HOSPITAL_COMMUNITY): Payer: Medicare Other

## 2017-06-24 DIAGNOSIS — M189 Osteoarthritis of first carpometacarpal joint, unspecified: Secondary | ICD-10-CM | POA: Diagnosis not present

## 2017-06-24 DIAGNOSIS — N183 Chronic kidney disease, stage 3 (moderate): Secondary | ICD-10-CM | POA: Diagnosis not present

## 2017-06-24 DIAGNOSIS — I5031 Acute diastolic (congestive) heart failure: Secondary | ICD-10-CM | POA: Diagnosis not present

## 2017-06-24 DIAGNOSIS — I5032 Chronic diastolic (congestive) heart failure: Secondary | ICD-10-CM | POA: Diagnosis not present

## 2017-06-24 DIAGNOSIS — G8929 Other chronic pain: Secondary | ICD-10-CM | POA: Diagnosis present

## 2017-06-24 DIAGNOSIS — Z6841 Body Mass Index (BMI) 40.0 and over, adult: Secondary | ICD-10-CM | POA: Diagnosis not present

## 2017-06-24 DIAGNOSIS — Z96652 Presence of left artificial knee joint: Secondary | ICD-10-CM | POA: Diagnosis present

## 2017-06-24 DIAGNOSIS — I2699 Other pulmonary embolism without acute cor pulmonale: Secondary | ICD-10-CM | POA: Diagnosis not present

## 2017-06-24 DIAGNOSIS — Z87891 Personal history of nicotine dependence: Secondary | ICD-10-CM | POA: Diagnosis not present

## 2017-06-24 DIAGNOSIS — T380X5A Adverse effect of glucocorticoids and synthetic analogues, initial encounter: Secondary | ICD-10-CM | POA: Diagnosis present

## 2017-06-24 DIAGNOSIS — Z888 Allergy status to other drugs, medicaments and biological substances status: Secondary | ICD-10-CM | POA: Diagnosis not present

## 2017-06-24 DIAGNOSIS — D72829 Elevated white blood cell count, unspecified: Secondary | ICD-10-CM | POA: Diagnosis present

## 2017-06-24 DIAGNOSIS — I130 Hypertensive heart and chronic kidney disease with heart failure and stage 1 through stage 4 chronic kidney disease, or unspecified chronic kidney disease: Secondary | ICD-10-CM | POA: Diagnosis not present

## 2017-06-24 DIAGNOSIS — I872 Venous insufficiency (chronic) (peripheral): Secondary | ICD-10-CM | POA: Diagnosis present

## 2017-06-24 DIAGNOSIS — L89152 Pressure ulcer of sacral region, stage 2: Secondary | ICD-10-CM | POA: Diagnosis not present

## 2017-06-24 DIAGNOSIS — M109 Gout, unspecified: Secondary | ICD-10-CM | POA: Diagnosis present

## 2017-06-24 DIAGNOSIS — Z881 Allergy status to other antibiotic agents status: Secondary | ICD-10-CM | POA: Diagnosis not present

## 2017-06-24 DIAGNOSIS — M62838 Other muscle spasm: Secondary | ICD-10-CM | POA: Diagnosis not present

## 2017-06-24 DIAGNOSIS — M25552 Pain in left hip: Secondary | ICD-10-CM | POA: Diagnosis present

## 2017-06-24 DIAGNOSIS — M545 Low back pain: Secondary | ICD-10-CM | POA: Diagnosis not present

## 2017-06-24 DIAGNOSIS — Z885 Allergy status to narcotic agent status: Secondary | ICD-10-CM | POA: Diagnosis not present

## 2017-06-24 DIAGNOSIS — M797 Fibromyalgia: Secondary | ICD-10-CM | POA: Diagnosis not present

## 2017-06-24 DIAGNOSIS — L409 Psoriasis, unspecified: Secondary | ICD-10-CM | POA: Diagnosis present

## 2017-06-24 DIAGNOSIS — R52 Pain, unspecified: Secondary | ICD-10-CM | POA: Diagnosis not present

## 2017-06-24 DIAGNOSIS — M5432 Sciatica, left side: Secondary | ICD-10-CM | POA: Diagnosis not present

## 2017-06-24 DIAGNOSIS — M25476 Effusion, unspecified foot: Secondary | ICD-10-CM | POA: Diagnosis not present

## 2017-06-24 DIAGNOSIS — R079 Chest pain, unspecified: Secondary | ICD-10-CM | POA: Diagnosis not present

## 2017-06-24 DIAGNOSIS — M159 Polyosteoarthritis, unspecified: Secondary | ICD-10-CM | POA: Diagnosis not present

## 2017-06-24 DIAGNOSIS — M1990 Unspecified osteoarthritis, unspecified site: Secondary | ICD-10-CM | POA: Diagnosis present

## 2017-06-24 DIAGNOSIS — L8990 Pressure ulcer of unspecified site, unspecified stage: Secondary | ICD-10-CM

## 2017-06-24 DIAGNOSIS — E785 Hyperlipidemia, unspecified: Secondary | ICD-10-CM | POA: Diagnosis not present

## 2017-06-24 DIAGNOSIS — D649 Anemia, unspecified: Secondary | ICD-10-CM | POA: Diagnosis present

## 2017-06-24 DIAGNOSIS — I10 Essential (primary) hypertension: Secondary | ICD-10-CM | POA: Diagnosis not present

## 2017-06-24 LAB — CBC
HCT: 35.6 % — ABNORMAL LOW (ref 36.0–46.0)
Hemoglobin: 11.1 g/dL — ABNORMAL LOW (ref 12.0–15.0)
MCH: 26.6 pg (ref 26.0–34.0)
MCHC: 31.2 g/dL (ref 30.0–36.0)
MCV: 85.4 fL (ref 78.0–100.0)
Platelets: 265 10*3/uL (ref 150–400)
RBC: 4.17 MIL/uL (ref 3.87–5.11)
RDW: 16.5 % — ABNORMAL HIGH (ref 11.5–15.5)
WBC: 12.2 10*3/uL — ABNORMAL HIGH (ref 4.0–10.5)

## 2017-06-24 LAB — COMPREHENSIVE METABOLIC PANEL
ALK PHOS: 80 U/L (ref 38–126)
ALT: 12 U/L — AB (ref 14–54)
AST: 20 U/L (ref 15–41)
Albumin: 3.2 g/dL — ABNORMAL LOW (ref 3.5–5.0)
Anion gap: 9 (ref 5–15)
BILIRUBIN TOTAL: 1 mg/dL (ref 0.3–1.2)
BUN: 17 mg/dL (ref 6–20)
CALCIUM: 8.8 mg/dL — AB (ref 8.9–10.3)
CHLORIDE: 103 mmol/L (ref 101–111)
CO2: 25 mmol/L (ref 22–32)
CREATININE: 1.11 mg/dL — AB (ref 0.44–1.00)
GFR, EST AFRICAN AMERICAN: 53 mL/min — AB (ref >60)
GFR, EST NON AFRICAN AMERICAN: 46 mL/min — AB (ref >60)
Glucose, Bld: 114 mg/dL — ABNORMAL HIGH (ref 65–99)
Potassium: 3.9 mmol/L (ref 3.5–5.1)
Sodium: 137 mmol/L (ref 135–145)
TOTAL PROTEIN: 5.8 g/dL — AB (ref 6.5–8.1)

## 2017-06-24 LAB — HEPARIN LEVEL (UNFRACTIONATED)
Heparin Unfractionated: 0.37 IU/mL (ref 0.30–0.70)
Heparin Unfractionated: 0.38 IU/mL (ref 0.30–0.70)

## 2017-06-24 MED ORDER — MORPHINE SULFATE (PF) 2 MG/ML IV SOLN
2.0000 mg | Freq: Once | INTRAVENOUS | Status: AC
  Administered 2017-06-24: 2 mg via INTRAVENOUS
  Filled 2017-06-24: qty 1

## 2017-06-24 MED ORDER — MORPHINE SULFATE (PF) 2 MG/ML IV SOLN
1.0000 mg | Freq: Once | INTRAVENOUS | Status: AC
  Administered 2017-06-24: 1 mg via INTRAVENOUS
  Filled 2017-06-24: qty 1

## 2017-06-24 MED ORDER — OXYCODONE HCL 5 MG PO TABS
5.0000 mg | ORAL_TABLET | ORAL | Status: DC | PRN
  Administered 2017-06-24 – 2017-06-28 (×10): 5 mg via ORAL
  Filled 2017-06-24 (×10): qty 1

## 2017-06-24 MED ORDER — APIXABAN 5 MG PO TABS
10.0000 mg | ORAL_TABLET | Freq: Two times a day (BID) | ORAL | Status: DC
  Administered 2017-06-25 – 2017-06-28 (×7): 10 mg via ORAL
  Filled 2017-06-24 (×7): qty 2

## 2017-06-24 MED ORDER — LIDOCAINE 5 % EX PTCH
1.0000 | MEDICATED_PATCH | CUTANEOUS | Status: DC
  Administered 2017-06-24: 1 via TRANSDERMAL
  Filled 2017-06-24 (×4): qty 1

## 2017-06-24 MED ORDER — APIXABAN 5 MG PO TABS: 5.0000 mg | ORAL_TABLET | Freq: Two times a day (BID) | ORAL | Status: DC

## 2017-06-24 MED ORDER — METHOCARBAMOL 500 MG PO TABS
750.0000 mg | ORAL_TABLET | Freq: Four times a day (QID) | ORAL | Status: DC | PRN
  Administered 2017-06-24 – 2017-06-27 (×2): 750 mg via ORAL
  Filled 2017-06-24 (×2): qty 2

## 2017-06-24 NOTE — Progress Notes (Signed)
[facility] CMA Benefit check        # 1. S / W  SABARINE  @ [facility] # [id number]   *  ELIQUIS 10 MG - NONE FORMULARY   1. ELIQUIS 2.5 NG BID  COVER- YES  CO-PAY- $ 3.36  TIER- 2 DRUG  PRIOR APPROVAL- NO   2. ELIQUIS 5 MG BID  COVER- YES  CO-PAY- $ 3.36  TIER- 2 DRUG  PRIOR APPROVAL- NO   3. XARELTO 15 MG BID  COVER- YES  CO-PAY- $ 3.36  TIER- 2 DRUG  PRIOR APPROVAL- NO   4. XARELTO 20 MG DAILY  COVER- YES  CO-PAY- $ 15.00  TIER- 2 DRUG  PRIOR APPROVAL- NO   PREFERRED PHARMACY : [facility]

## 2017-06-24 NOTE — Progress Notes (Signed)
ANTICOAGULATION CONSULT NOTE - Follow Up Consult  Pharmacy Consult for heparin Indication: pulmonary embolus  Labs: Recent Labs    06/22/17 2016 06/23/17 1020 06/23/17 1811 06/24/17 0254  HGB 11.2*  --   --  11.1*  HCT 35.9*  --   --  35.6*  PLT 281  --   --  265  LABPROT  --  16.5*  --   --   INR  --  1.35  --   --   HEPARINUNFRC  --   --  0.43 0.37  CREATININE 1.00  --   --   --     Assessment: 82yo female remains therapeutic on heparin though trending down and at lower end of goal, would prefer higher level w/ PE.  Goal of Therapy:  Heparin level 0.3-0.7 units/ml   Plan:  Will increase heparin gtt by 1 unit/kg/hr to 1500 units/hr and check level in [place], PharmD, BCPS  06/24/2017,4:04 AM

## 2017-06-24 NOTE — Discharge Instructions (Addendum)
Information on my medicine - ELIQUIS (apixaban)  This medication education was reviewed with me or my healthcare representative as part of my discharge preparation.  The pharmacist that spoke with me during my hospital stay was:  Arty Baumgartner, [facility]  Why was Eliquis prescribed for you? Eliquis was prescribed to treat blood clots that may have been found in the veins of your legs (deep vein thrombosis) or in your lungs (pulmonary embolism) and to reduce the risk of them occurring again.  What do You need to know about Eliquis ? The starting dose is 10 mg (two 5 mg tablets) taken TWICE daily for the FIRST SEVEN (7) DAYS, then on (enter date)  07/02/17  the dose is reduced to ONE 5 mg tablet taken TWICE daily.  Eliquis may be taken with or without food.   Try to take the dose about the same time in the morning and in the evening. If you have difficulty swallowing the tablet whole please discuss with your pharmacist how to take the medication safely.  Take Eliquis exactly as prescribed and DO NOT stop taking Eliquis without talking to the doctor who prescribed the medication.  Stopping may increase your risk of developing a new blood clot.  Refill your prescription before you run out.  After discharge, you should have regular check-up appointments with your healthcare provider that is prescribing your Eliquis.    What do you do if you miss a dose? If a dose of ELIQUIS is not taken at the scheduled time, take it as soon as possible on the same day and twice-daily administration should be resumed. The dose should not be doubled to make up for a missed dose.  Important Safety Information A possible side effect of Eliquis is bleeding. You should call your healthcare provider right away if you experience any of the following: ? Bleeding from an injury or your nose that does not stop. ? Unusual colored urine (red or dark brown) or unusual colored stools (red or black). ? Unusual  bruising for unknown reasons. ? A serious fall or if you hit your head (even if there is no bleeding).  Some medicines may interact with Eliquis and might increase your risk of bleeding or clotting while on Eliquis. To help avoid this, consult your healthcare provider or pharmacist prior to using any new prescription or non-prescription medications, including herbals, vitamins, non-steroidal anti-inflammatory drugs (NSAIDs) and supplements.  This website has more information on Eliquis (apixaban): http://www.eliquis.com/eliquis/home   Additional discharge instructions:  Please get your medications reviewed and adjusted by your Primary MD.  Please request your Primary MD to go over all Hospital Tests and Procedure/Radiological results at the follow up, please get all Hospital records sent to your Prim MD by signing hospital release before you go home.  If you had Pneumonia of Lung problems at the Hospital: Please get a 2 view Chest X ray done in 6-8 weeks after hospital discharge or sooner if instructed by your Primary MD.  If you have Congestive Heart Failure: Please call your Cardiologist or Primary MD anytime you have any of the following symptoms:  1) 3 pound weight gain in 24 hours or 5 pounds in 1 week  2) shortness of breath, with or without a dry hacking cough  3) swelling in the hands, feet or stomach  4) if you have to sleep on extra pillows at night in order to breathe  Follow cardiac low salt diet and 1.5 lit/day fluid restriction.  If  you have diabetes Accuchecks 4 times/day, Once in AM empty stomach and then before each meal. Log in all results and show them to your primary doctor at your next visit. If any glucose reading is under 80 or above 300 call your primary MD immediately.  If you have Seizure/Convulsions/Epilepsy: Please do not drive, operate heavy machinery, participate in activities at heights or participate in high speed sports until you have seen by  Primary MD or a Neurologist and advised to do so again.  If you had Gastrointestinal Bleeding: Please ask your Primary MD to check a complete blood count within one week of discharge or at your next visit. Your endoscopic/colonoscopic biopsies that are pending at the time of discharge, will also need to followed by your Primary MD.  Get Medicines reviewed and adjusted. Please take all your medications with you for your next visit with your Primary MD  Please request your Primary MD to go over all hospital tests and procedure/radiological results at the follow up, please ask your Primary MD to get all Hospital records sent to his/her office.  If you experience worsening of your admission symptoms, develop shortness of breath, life threatening emergency, suicidal or homicidal thoughts you must seek medical attention immediately by calling 911 or calling your MD immediately  if symptoms less severe.  You must read complete instructions/literature along with all the possible adverse reactions/side effects for all the Medicines you take and that have been prescribed to you. Take any new Medicines after you have completely understood and accpet all the possible adverse reactions/side effects.   Do not drive or operate heavy machinery when taking Pain medications.   Do not take more than prescribed Pain, Sleep and Anxiety Medications  Special Instructions: If you have smoked or chewed Tobacco  in the last 2 yrs please stop smoking, stop any regular Alcohol  and or any Recreational drug use.  Wear Seat belts while driving.  Please note You were cared for by a hospitalist during your hospital stay. If you have any questions about your discharge medications or the care you received while you were in the hospital after you are discharged, you can call the unit and asked to speak with the hospitalist on call if the hospitalist that took care of you is not available. Once you are discharged, your primary  care physician will handle any further medical issues. Please note that NO REFILLS for any discharge medications will be authorized once you are discharged, as it is imperative that you return to your primary care physician (or establish a relationship with a primary care physician if you do not have one) for your aftercare needs so that they can reassess your need for medications and monitor your lab values.  You can reach the hospitalist office at phone [phone] or fax [phone]   If you do not have a primary care physician, you can call [phone] for a physician referral.

## 2017-06-24 NOTE — Progress Notes (Signed)
ANTICOAGULATION CONSULT NOTE - Follow Up Consult  Pharmacy Consult for Heparin->transition to Eliquis on 12/21 Indication: pulmonary embolus  Allergies  Allergen Reactions  . Codeine Sulfate Shortness Of Breath  . Celecoxib Other (See Comments)    Pt bled out  . Erythromycin Base Nausea And Vomiting    Patient Measurements: Height: 5\' 2"  (157.5 cm) Weight: 266 lb 1.5 oz (120.7 kg) IBW/kg (Calculated) : 50.1 Heparin Dosing Weight: 82 kg  Vital Signs: Temp: 98.8 F (37.1 C) (12/20 1145) Temp Source: Oral (12/20 1145) BP: 148/84 (12/20 1030) Pulse Rate: 86 (12/20 1145)  Labs: Recent Labs    06/22/17 2016 06/23/17 1020 06/23/17 1811 06/24/17 0254 06/24/17 1130  HGB 11.2*  --   --  11.1*  --   HCT 35.9*  --   --  35.6*  --   PLT 281  --   --  265  --   LABPROT  --  16.5*  --   --   --   INR  --  1.35  --   --   --   HEPARINUNFRC  --   --  0.43 0.37 0.38  CREATININE 1.00  --   --  1.11*  --     Estimated Creatinine Clearance: 50 mL/min (A) (by C-G formula based on SCr of 1.11 mg/dL (H)).  Assessment:   82 yr old female with extensive bilateral PE per CTA on 06/23/17. No right heart strain. Duplex negative for DVT.     Heparin level remains low therapeutic (0.38) on 1500 units/hr.     To transition to Eliquis on 12/21 per discussion with Dr. Quincy Simmonds.    Discussed with patient and daughter.  They are familiar with oral anticoagulants, as daughter and patient's spouse previously took Coumadin.  Goal of Therapy:  Heparin level 0.3-0.7 units/ml appropriate Eliquis dose for indication Monitor platelets by anticoagulation protocol: Yes   Plan:   Increase heparin drip to 1600 units/hr, to try to keep level in target range.  Next heparin level and CBC in am.  On 12/21 at 10am, stop IV heparin and begin Eliquis 10 mg PO BID x 7 days, then 5 mg PO BID.  Arty Baumgartner, [place] Pager: [phone] 06/24/2017,4:29 PM

## 2017-06-24 NOTE — Progress Notes (Signed)
PROGRESS NOTE [facility]   Ivannia Willhelm   BTD:[id number] DOB: 1934/11/17  DOA: 06/23/2017 PCP: Venia Carbon, MD   Brief Narrative:  Chelsea Keller is a 82 year old female with medical history significant for hypertension, hyperlipidemia, morbid obesity, chronic venous insufficiency and congestive heart failure presented to the emergency department complaining of 1 week of progressive shortness of breath back pain.  Upon ED evaluation patient was found bilateral pulmonary embolism for which she was started on heparin drip for further monitoring.  Workup including lower extremity Doppler and echocardiogram has been negative so far.   Subjective: Patient seen and examined, complaining of back pain, not on any oxygen requirement.  Denies chest pain, shortness of breath, palpitations and dizziness.  Patient requesting morphine for back pain claiming that is the only medication that works.  Assessment & Plan: Pulmonary embolism bilaterally Unprovoked, no hx of recent travel, no recent hospitalization, no cancer. Patient is very sedentary due to obesity   On heparin drip Lower extremity ultrasound negative for DVT Echocardiogram shows no heart strain Will transition to Eliquis in the morning  Hypertension BP stable Continue home medication  Chronic back pain, muscle spasm, fibromyalgia Robaxin, resume home oxycodone, avoid IV narcotics. PT consult   Chronic kidney disease stage III Creatinine stable at baseline. To need to monitor  Morbid obesity Follow-up as an outpatient  DVT prophylaxis: Heparin drip Code Status: Full code Family Communication: None at bedside  Disposition Plan: Home in the next 24 hours   Consultants:   None  Procedures:   Echocardiogram  Antimicrobials:  None   Objective: Vitals:   06/24/17 0346 06/24/17 1027 06/24/17 1030 06/24/17 1145  BP:  (!) 149/105 (!) 148/84   Pulse:  87  86  Resp:      Temp:    98.8 F (37.1 C)    TempSrc:    Oral  SpO2:  97%  94%  Weight: 120.7 kg (266 lb 1.5 oz)     Height:        Intake/Output Summary (Last 24 hours) at 06/24/2017 1640 Last data filed at 06/24/2017 1025 Gross per 24 hour  Intake 616.68 ml  Output 1275 ml  Net -658.32 ml   Filed Weights   06/22/17 2019 06/23/17 1707 06/24/17 0346  Weight: 123.4 kg (272 lb) 120.6 kg (265 lb 14 oz) 120.7 kg (266 lb 1.5 oz)    Examination:  General exam: Appears calm and comfortable  Respiratory system: Clear to auscultation. No wheezes,crackle or rhonchi Cardiovascular system: S1 & S2 heard, RRR. No JVD, murmurs, rubs or gallops Gastrointestinal system: Obese abdomen is nondistended, soft and nontender. Central nervous system: Alert and oriented. No focal neurological deficits. Extremities: Lower extremity edema, chronic Musculoskeletal: Paravertebral tenderness in the left side, consistent with muscle spasm no CVA tenderness Skin: No rashes, lesions or ulcers Psychiatry: Mood & affect anxious   Data Reviewed: I have personally reviewed following labs and imaging studies  CBC: Recent Labs  Lab 06/22/17 2016 06/24/17 0254  WBC 10.9* 12.2*  HGB 11.2* 11.1*  HCT 35.9* 35.6*  MCV 85.5 85.4  PLT 281 106   Basic Metabolic Panel: Recent Labs  Lab 06/22/17 2016 06/24/17 0254  NA 139 137  K 3.7 3.9  CL 104 103  CO2 27 25  GLUCOSE 106* 114*  BUN 13 17  CREATININE 1.00 1.11*  CALCIUM 9.3 8.8*   GFR: Estimated Creatinine Clearance: 50 mL/min (A) (by C-G formula based on SCr of 1.11 mg/dL (H)). Liver Function Tests:  Recent Labs  Lab 06/24/17 0254  AST 20  ALT 12*  ALKPHOS 80  BILITOT 1.0  PROT 5.8*  ALBUMIN 3.2*   No results for input(s): LIPASE, AMYLASE in the last 168 hours. No results for input(s): AMMONIA in the last 168 hours. Coagulation Profile: Recent Labs  Lab 06/23/17 1020  INR 1.35   Cardiac Enzymes: No results for input(s): CKTOTAL, CKMB, CKMBINDEX, TROPONINI in the last 168  hours. BNP (last 3 results) No results for input(s): PROBNP in the last 8760 hours. HbA1C: Recent Labs    06/23/17 1020  HGBA1C 5.6   CBG: Recent Labs  Lab 06/23/17 0942  GLUCAP 113*   Lipid Profile: Recent Labs    06/23/17 1020  CHOL 177  HDL 44  LDLCALC 117*  TRIG 80  CHOLHDL 4.0   Thyroid Function Tests: No results for input(s): TSH, T4TOTAL, FREET4, T3FREE, THYROIDAB in the last 72 hours. Anemia Panel: No results for input(s): VITAMINB12, FOLATE, FERRITIN, TIBC, IRON, RETICCTPCT in the last 72 hours. Sepsis Labs: No results for input(s): PROCALCITON, LATICACIDVEN in the last 168 hours.  No results found for this or any previous visit (from the past 240 hour(s)).    Radiology Studies: X-ray Chest Pa And Lateral  Result Date: 06/24/2017 CLINICAL DATA:  Chest pain and back pain.  Pulmonary emboli. EXAM: CHEST  2 VIEW COMPARISON:  06/22/2017 FINDINGS: Heart size remains normal. Aortic atherosclerosis. Lungs remain clear. No significant atelectasis. No infiltrate or effusion. Ordinary degenerative changes affect the spine. IMPRESSION: No active disease by radiography.  No significant atelectasis. Electronically Signed   By: Nelson Chimes M.D.   On: 06/24/2017 09:05   Dg Chest 2 View  Result Date: 06/22/2017 CLINICAL DATA:  Shortness of breath EXAM: CHEST  2 VIEW COMPARISON:  09/04/2014 FINDINGS: Chronic cardiomegaly. Prominent main pulmonary artery contour. Vascular congestion. No Kerley lines or effusion. No air bronchogram or pneumothorax. IMPRESSION: Cardiomegaly and vascular congestion.  No edema or pneumonia. Electronically Signed   By: Monte Fantasia M.D.   On: 06/22/2017 21:00   Ct Angio Chest Pe W And/or Wo Contrast  Result Date: 06/23/2017 CLINICAL DATA:  Short of breath EXAM: CT ANGIOGRAPHY CHEST WITH CONTRAST TECHNIQUE: Multidetector CT imaging of the chest was performed using the standard protocol during bolus administration of intravenous contrast.  Multiplanar CT image reconstructions and MIPs were obtained to evaluate the vascular anatomy. CONTRAST:  144mL ISOVUE-370 IOPAMIDOL (ISOVUE-370) INJECTION 76% COMPARISON:  06/22/2017 FINDINGS: Cardiovascular: Extensive pulmonary embolism. Large volume pulmonary embolism with clot in the upper and lower lobes bilaterally. Pulmonary arteries mildly enlarged. RV/LV ratio 0.8. Negative for right heart strain. Coronary calcification. Atherosclerotic calcification aortic arch without aneurysm or dissection. Heart size upper normal. Mediastinum/Nodes: Negative Lungs/Pleura: Lungs are clear.  No effusion. Upper Abdomen: Negative Musculoskeletal: Negative Review of the MIP images confirms the above findings. IMPRESSION: Extensive bilateral pulmonary embolism without right heart strain. The lungs are clear Aortic Atherosclerosis (ICD10-I70.0). These results were called by telephone at the time of interpretation on 06/23/2017 at 8:42 am to Dr. Johnney Killian, who verbally acknowledged these results. Electronically Signed   By: Franchot Gallo M.D.   On: 06/23/2017 08:43      Scheduled Meds: . Derrill Memo ON 06/25/2017] apixaban  10 mg Oral BID   Followed by  . [START ON 07/02/2017] apixaban  5 mg Oral BID  . lidocaine  1 patch Transdermal Q24H  . potassium chloride SA  40 mEq Oral Daily  . triamterene-hydrochlorothiazide  1 tablet Oral Daily  Continuous Infusions: . heparin 1,600 Units/hr (06/24/17 1631)     LOS: 0 days    Time spent: Total of 35 minutes spent with pt, greater than 50% of which was spent in discussion of  treatment, counseling and coordination of care    Chipper Oman, MD Pager: Text Page via [email or web address]   If 7PM-7AM, please contact night-coverage [email or web address] 06/24/2017, 4:40 PM

## 2017-06-24 NOTE — Progress Notes (Signed)
Patient reports to this RN that she is in excruciating pain in her left side and needs more Morphine.  Lidoderm patch placed by night nurse less than one hour prior to now.  I explained to patient that the patch has not had time to go into effect, patient says the patch is doing nothing and she needs something for pain.  Text page sent to provider who immediately called back and stated he would need to evaluate the patient prior to any new orders for pain medication.  This RN attempted to explain to the patient that it is very important that the provider evaluate her and make sure no further testing needs to be done regarding her pain rather than just giving her pain medication.

## 2017-06-24 NOTE — Plan of Care (Signed)
VSS, patient  has had pain in left side that was finally improved after OxyIr, Robaxin and K-Pad.  Daughter at bedside.  Patient unable to mobilize any today d/t the pain in her left side.

## 2017-06-25 ENCOUNTER — Telehealth: Payer: Self-pay | Admitting: Cardiovascular Disease

## 2017-06-25 DIAGNOSIS — N183 Chronic kidney disease, stage 3 (moderate): Secondary | ICD-10-CM

## 2017-06-25 LAB — CBC
HCT: 36.2 % (ref 36.0–46.0)
Hemoglobin: 11.2 g/dL — ABNORMAL LOW (ref 12.0–15.0)
MCH: 26.7 pg (ref 26.0–34.0)
MCHC: 30.9 g/dL (ref 30.0–36.0)
MCV: 86.4 fL (ref 78.0–100.0)
Platelets: 266 10*3/uL (ref 150–400)
RBC: 4.19 MIL/uL (ref 3.87–5.11)
RDW: 16.8 % — ABNORMAL HIGH (ref 11.5–15.5)
WBC: 12.2 10*3/uL — ABNORMAL HIGH (ref 4.0–10.5)

## 2017-06-25 LAB — BASIC METABOLIC PANEL
Anion gap: 10 (ref 5–15)
BUN: 20 mg/dL (ref 6–20)
CALCIUM: 9 mg/dL (ref 8.9–10.3)
CO2: 22 mmol/L (ref 22–32)
CREATININE: 1.05 mg/dL — AB (ref 0.44–1.00)
Chloride: 104 mmol/L (ref 101–111)
GFR calc non Af Amer: 49 mL/min — ABNORMAL LOW (ref >60)
GFR, EST AFRICAN AMERICAN: 57 mL/min — AB (ref >60)
Glucose, Bld: 110 mg/dL — ABNORMAL HIGH (ref 65–99)
Potassium: 4.2 mmol/L (ref 3.5–5.1)
SODIUM: 136 mmol/L (ref 135–145)

## 2017-06-25 LAB — HEPARIN LEVEL (UNFRACTIONATED): Heparin Unfractionated: 0.44 IU/mL (ref 0.30–0.70)

## 2017-06-25 MED ORDER — ELIQUIS 5 MG VTE STARTER PACK: ORAL_TABLET | ORAL | 0 refills | Status: DC

## 2017-06-25 MED ORDER — METHOCARBAMOL 750 MG PO TABS: 750.0000 mg | ORAL_TABLET | Freq: Four times a day (QID) | ORAL | 0 refills | Status: DC | PRN

## 2017-06-25 NOTE — Progress Notes (Signed)
Patient dressed and ready to discharge home when she stated she cannot stand to transfer to wheelchair.  Chelsea Keller declined PT evaluation earlier today and stated she will accept consult now that she cannot stand to get to wheelchair.  Dr. Quincy Simmonds informed of above and PT to be contacted again for evaluation.

## 2017-06-25 NOTE — Care Management Note (Addendum)
Case Management Note  Patient Details  Name: Chelsea Keller MRN: [id number] Date of Birth: Jun 22, 1935  Subjective/Objective:  Pulmonary embolism bilaterally         Action/Plan: In to speak with Pt, daughter "[name]" at bedside Prior to Admission Pt from Home w/ Daughter.  PCP is LETVAK, RICHARD. Discussed recommendations made for [facility]. Has used [facility] in the past. Pt states she does not feel like she needs HH PT. Uses [facility] in [place].  Home DME devices include: Rolling walker, cane, bedside commode, shower chair.  Pt states she is independent of all ADL's.  Discussed new prescription for Eliquis, free 30 day assist card provided.   Expected Discharge Date:  06/25/17               Expected Discharge Plan:  Home/Self Care  Discharge planning Services  CM Consult  Post Acute Care Choice:  Home Health Choice offered to:  Patient, Adult Children  HH Arranged:  Patient Refused [facility]  Status of Service:  Completed, signed off  Montel Culver, BSN, RN Case Manager-Orientation [phone] 06/25/2017, 2:04 PM

## 2017-06-25 NOTE — Progress Notes (Addendum)
PT evaluation completed and SNF recommended.  Dr. Quincy Simmonds informed and orders given to d/c discharge order, OK to leave IV out, d/c telemetry, d/c every 8 hour 12-Lead EKGs, and transfer to Med-Surg level of care.  Patient and her daughter updated on plan of care.

## 2017-06-25 NOTE — Progress Notes (Signed)
PT Cancellation Note  Patient Details Name: Chelsea Keller MRN: [id number] DOB: 02-03-1935   Cancelled Treatment:    Reason Eval/Treat Not Completed: Patient declined, no reason specified.   Reviewed patient's home situation (with daughter and 3 grandsons), and home equipment (RW, cane, tub bench, shower seat, BSC).  Patient reports she is having too much pain to participate with PT.  States she will be fine to ambulate into house with RW.  States she has stairs, but is able to negotiate with grandson's assist.  Patient reports no f/u PT needs - had HHPT in past and maximized functional mobility per patient.  PT will sign off due to patient declining services.  To d/c home today.   Despina Pole 06/25/2017, 2:08 PM Carita Pian Sanjuana Kava, [place] Pager [phone]

## 2017-06-25 NOTE — Progress Notes (Signed)
ANTICOAGULATION CONSULT NOTE - Follow Up Consult  Pharmacy Consult for Heparin->transition to Eliquis on 12/21 Indication: pulmonary embolus  Allergies  Allergen Reactions  . Codeine Sulfate Shortness Of Breath  . Celecoxib Other (See Comments)    Pt bled out  . Erythromycin Base Nausea And Vomiting    Patient Measurements: Height: 5\' 2"  (157.5 cm) Weight: 265 lb 3.4 oz (120.3 kg) IBW/kg (Calculated) : 50.1 Heparin Dosing Weight: 82 kg  Vital Signs: Temp: 98.7 F (37.1 C) (12/21 0424) Temp Source: Oral (12/21 0424) BP: 132/62 (12/21 0424) Pulse Rate: 54 (12/21 0424)  Labs: Recent Labs    06/22/17 2016 06/23/17 1020  06/24/17 0254 06/24/17 1130 06/25/17 0534  HGB 11.2*  --   --  11.1*  --  11.2*  HCT 35.9*  --   --  35.6*  --  36.2  PLT 281  --   --  265  --  266  LABPROT  --  16.5*  --   --   --   --   INR  --  1.35  --   --   --   --   HEPARINUNFRC  --   --    < > 0.37 0.38 0.44  CREATININE 1.00  --   --  1.11*  --  1.05*   < > = values in this interval not displayed.    Estimated Creatinine Clearance: 52.8 mL/min (A) (by C-G formula based on SCr of 1.05 mg/dL (H)).  Assessment: 82 yr old female with extensive bilateral PE per CTA on 06/23/17. No right heart strain. Duplex negative for DVT.  Plan is to transition to Eliquis today.  Heparin running at 1600 units/hr- this will stop at 1000 and then first dose of Eliquis to be given.  CBC is stable, no bleeding noted.  Goal of Therapy:  Heparin level 0.3-0.7 units/ml appropriate Eliquis dose for indication Monitor platelets by anticoagulation protocol: Yes   Plan:  On 12/21 at 1000, stop IV heparin and begin Eliquis 10 mg PO BID x 7 days, then 5 mg PO BID. CBC q48h Education completed   Hussein Macdougal D. Kennadee Walthour, PharmD, [place] Clinical Pharmacist Clinical Phone for 06/25/2017 until 3:30pm: [phone] If after 3:30pm, please call main pharmacy at [phone] 06/25/2017 8:33 AM

## 2017-06-25 NOTE — Discharge Summary (Signed)
Physician Discharge Summary  Chelsea Keller  QQP:[id number]  DOB: 06-20-1935  DOA: 06/23/2017 PCP: Venia Carbon, MD  Admit date: 06/23/2017 Discharge date: 06/25/2017  Admitted From: Home  Disposition:  Home   Recommendations for Outpatient Follow-up:  1. Follow up with PCP in 1-2 weeks 2. Please obtain BMP/CBC in one week to monitor Cr and Hgb  3. On NOAC for at least 6 month, may consider repeat CTA to re-evaluate for PE   Discharge Condition: Stable  CODE STATUS:Full Code   Diet recommendation: Heart Healthy / Carb Modified   Brief/Interim Summary: Chelsea Keller is a 82 year old female with medical history significant for hypertension, hyperlipidemia, morbid obesity, chronic venous insufficiency and congestive heart failure presented to the emergency department complaining of 1 week of progressive shortness of breath back pain. Upon ED evaluation patient was found bilateral pulmonary embolism for which she was started on heparin drip.  Workup including lower extremity Doppler and echocardiogram with no acute abnormalities. Patient complained about back pain during hospital stay, felt to be muscle spasm, treated with Robaxin and home dose Oxy. Patient was transitioned to oral anticoagulation. Patient clinically improved off oxygen and pain improved, she was discharge home to follow up with PCP.   Subjective: Patient seen and examined, feeling much better pain has improved. No acute events overnight.   Discharge Diagnoses/Hospital Course:   Pulmonary embolism bilaterally Unprovoked, no hx of recent travel, no recent hospitalization, no cancer. Patient is very sedentary due to obesity   Treated with heparin ggt for 36 hrs transitioned to Eliquis - at least 6 month, likely will need lifelong   Lower extremity ultrasound negative for DVT Echocardiogram shows no heart strain  Hypertension BP stable Continue home medication  Chronic back pain, muscle spasm,  fibromyalgia Treated with Kpad, Robaxin.  Can resume home Oxy Follow up as outpatient  Weight loss   Chronic kidney disease stage III Creatinine stable at baseline. BMP in 1 week   Morbid obesity Follow up with PCP Weight loss   All other chronic medical condition were stable during the hospitalization.  On the day of the discharge the patient's vitals were stable, and no other acute medical condition were reported by patient. the patient was felt safe to be discharge to home.   Discharge Instructions  You were cared for by a hospitalist during your hospital stay. If you have any questions about your discharge medications or the care you received while you were in the hospital after you are discharged, you can call the unit and asked to speak with the hospitalist on call if the hospitalist that took care of you is not available. Once you are discharged, your primary care physician will handle any further medical issues. Please note that NO REFILLS for any discharge medications will be authorized once you are discharged, as it is imperative that you return to your primary care physician (or establish a relationship with a primary care physician if you do not have one) for your aftercare needs so that they can reassess your need for medications and monitor your lab values.  Discharge Instructions    Call MD for:  difficulty breathing, headache or visual disturbances   Complete by:  As directed    Call MD for:  extreme fatigue   Complete by:  As directed    Call MD for:  hives   Complete by:  As directed    Call MD for:  persistant dizziness or light-headedness   Complete by:  As  directed    Call MD for:  persistant nausea and vomiting   Complete by:  As directed    Call MD for:  redness, tenderness, or signs of infection (pain, swelling, redness, odor or green/yellow discharge around incision site)   Complete by:  As directed    Call MD for:  severe uncontrolled pain   Complete by:   As directed    Call MD for:  temperature >100.4   Complete by:  As directed    Diet - low sodium heart healthy   Complete by:  As directed    Increase activity slowly   Complete by:  As directed      Allergies as of 06/25/2017      Reactions   Codeine Sulfate Shortness Of Breath   Celecoxib Other (See Comments)   Pt bled out   Erythromycin Base Nausea And Vomiting      Medication List    STOP taking these medications   fluconazole 100 MG tablet Commonly known as:  DIFLUCAN   senna 8.6 MG Tabs tablet Commonly known as:  SENOKOT   sulfamethoxazole-trimethoprim 800-160 MG tablet Commonly known as:  BACTRIM DS,SEPTRA DS     TAKE these medications   acetaminophen 500 MG tablet Commonly known as:  TYLENOL Take 1,000 mg by mouth at bedtime as needed for mild pain or moderate pain.   ELIQUIS STARTER PACK 5 MG Tabs Take as directed on package: start with two-5mg  tablets twice daily for 7 days. On day 8, switch to one-5mg  tablet twice daily.   methocarbamol 750 MG tablet Commonly known as:  ROBAXIN Take 1 tablet (750 mg total) by mouth every 6 (six) hours as needed for up to 3 days for muscle spasms.   oxyCODONE-acetaminophen 5-325 MG tablet Commonly known as:  PERCOCET/ROXICET Take 0.5-1 tablets by mouth every 4 (four) hours as needed for moderate pain or severe pain.   potassium chloride SA 20 MEQ tablet Commonly known as:  K-DUR,KLOR-CON TAKE 2 TABLETS (40 MEQ TOTAL) DAILY   triamterene-hydrochlorothiazide 37.5-25 MG tablet Commonly known as:  MAXZIDE-25 Take 1 tablet by mouth daily.       Allergies  Allergen Reactions  . Codeine Sulfate Shortness Of Breath  . Celecoxib Other (See Comments)    Pt bled out  . Erythromycin Base Nausea And Vomiting    Consultations:  None    Procedures/Studies: X-ray Chest Pa And Lateral  Result Date: 06/24/2017 CLINICAL DATA:  Chest pain and back pain.  Pulmonary emboli. EXAM: CHEST  2 VIEW COMPARISON:  06/22/2017  FINDINGS: Heart size remains normal. Aortic atherosclerosis. Lungs remain clear. No significant atelectasis. No infiltrate or effusion. Ordinary degenerative changes affect the spine. IMPRESSION: No active disease by radiography.  No significant atelectasis. Electronically Signed   By: Nelson Chimes M.D.   On: 06/24/2017 09:05   Dg Chest 2 View  Result Date: 06/22/2017 CLINICAL DATA:  Shortness of breath EXAM: CHEST  2 VIEW COMPARISON:  09/04/2014 FINDINGS: Chronic cardiomegaly. Prominent main pulmonary artery contour. Vascular congestion. No Kerley lines or effusion. No air bronchogram or pneumothorax. IMPRESSION: Cardiomegaly and vascular congestion.  No edema or pneumonia. Electronically Signed   By: Monte Fantasia M.D.   On: 06/22/2017 21:00   Ct Angio Chest Pe W And/or Wo Contrast  Result Date: 06/23/2017 CLINICAL DATA:  Short of breath EXAM: CT ANGIOGRAPHY CHEST WITH CONTRAST TECHNIQUE: Multidetector CT imaging of the chest was performed using the standard protocol during bolus administration of intravenous contrast. Multiplanar  CT image reconstructions and MIPs were obtained to evaluate the vascular anatomy. CONTRAST:  166mL ISOVUE-370 IOPAMIDOL (ISOVUE-370) INJECTION 76% COMPARISON:  06/22/2017 FINDINGS: Cardiovascular: Extensive pulmonary embolism. Large volume pulmonary embolism with clot in the upper and lower lobes bilaterally. Pulmonary arteries mildly enlarged. RV/LV ratio 0.8. Negative for right heart strain. Coronary calcification. Atherosclerotic calcification aortic arch without aneurysm or dissection. Heart size upper normal. Mediastinum/Nodes: Negative Lungs/Pleura: Lungs are clear.  No effusion. Upper Abdomen: Negative Musculoskeletal: Negative Review of the MIP images confirms the above findings. IMPRESSION: Extensive bilateral pulmonary embolism without right heart strain. The lungs are clear Aortic Atherosclerosis (ICD10-I70.0). These results were called by telephone at the time of  interpretation on 06/23/2017 at 8:42 am to Dr. Johnney Killian, who verbally acknowledged these results. Electronically Signed   By: Franchot Gallo M.D.   On: 06/23/2017 08:43   ECHO 06/23/17 ------------------------------------------------------------------- Study Conclusions  - Left ventricle: The cavity size was normal. Systolic function was   normal. The estimated ejection fraction was in the range of 55%   to 60%. Wall motion was normal; there were no regional wall   motion abnormalities. Doppler parameters are consistent with   abnormal left ventricular relaxation (grade 1 diastolic   dysfunction). - Aortic valve: There was trivial regurgitation. - Mitral valve: Calcified annulus. Mildly thickened leaflets . - Atrial septum: No defect or patent foramen ovale was identified. - Pulmonary arteries: PA peak pressure: 32 mm Hg (S).   Discharge Exam: Vitals:   06/25/17 0424 06/25/17 0949  BP: 132/62 (!) 167/65  Pulse: (!) 54 85  Resp: 18   Temp: 98.7 F (37.1 C)   SpO2: 94%    Vitals:   06/24/17 1643 06/24/17 2041 06/25/17 0424 06/25/17 0949  BP: (!) 145/71 122/60 132/62 (!) 167/65  Pulse: 84 83 (!) 54 85  Resp:  20 18   Temp:  98.3 F (36.8 C) 98.7 F (37.1 C)   TempSrc:  Oral Oral   SpO2: 93% 93% 94%   Weight:   120.3 kg (265 lb 3.4 oz)   Height:        General: Pt is alert, awake, not in acute distress Cardiovascular: RRR, S1/S2 +, no rubs, no gallops Respiratory: CTA bilaterally, no wheezing, no rhonchi Abdominal: Obese, Soft, NT, ND, bowel sounds + Extremities: no edema MSK: Paravertebral tenderness at the Left side, hard node consistent with muscle spasm   The results of significant diagnostics from this hospitalization (including imaging, microbiology, ancillary and laboratory) are listed below for reference.     Microbiology: No results found for this or any previous visit (from the past 240 hour(s)).   Labs: BNP (last 3 results) Recent Labs     06/23/17 0443  BNP 86.5   Basic Metabolic Panel: Recent Labs  Lab 06/22/17 2016 06/24/17 0254 06/25/17 0534  NA 139 137 136  K 3.7 3.9 4.2  CL 104 103 104  CO2 27 25 22   GLUCOSE 106* 114* 110*  BUN 13 17 20   CREATININE 1.00 1.11* 1.05*  CALCIUM 9.3 8.8* 9.0   Liver Function Tests: Recent Labs  Lab 06/24/17 0254  AST 20  ALT 12*  ALKPHOS 80  BILITOT 1.0  PROT 5.8*  ALBUMIN 3.2*   No results for input(s): LIPASE, AMYLASE in the last 168 hours. No results for input(s): AMMONIA in the last 168 hours. CBC: Recent Labs  Lab 06/22/17 2016 06/24/17 0254 06/25/17 0534  WBC 10.9* 12.2* 12.2*  HGB 11.2* 11.1* 11.2*  HCT 35.9* 35.6*  36.2  MCV 85.5 85.4 86.4  PLT 281 265 266   Cardiac Enzymes: No results for input(s): CKTOTAL, CKMB, CKMBINDEX, TROPONINI in the last 168 hours. BNP: Invalid input(s): POCBNP CBG: Recent Labs  Lab 06/23/17 0942  GLUCAP 113*   D-Dimer Recent Labs    06/23/17 0605  DDIMER 8.48*   Hgb A1c Recent Labs    06/23/17 1020  HGBA1C 5.6   Lipid Profile Recent Labs    06/23/17 1020  CHOL 177  HDL 44  LDLCALC 117*  TRIG 80  CHOLHDL 4.0   Thyroid function studies No results for input(s): TSH, T4TOTAL, T3FREE, THYROIDAB in the last 72 hours.  Invalid input(s): FREET3 Anemia work up No results for input(s): VITAMINB12, FOLATE, FERRITIN, TIBC, IRON, RETICCTPCT in the last 72 hours. Urinalysis    Component Value Date/Time   COLORURINE YELLOW 06/23/2017 0928   APPEARANCEUR CLEAR 06/23/2017 0928   LABSPEC 1.021 06/23/2017 0928   PHURINE 5.0 06/23/2017 0928   GLUCOSEU NEGATIVE 06/23/2017 0928   HGBUR NEGATIVE 06/23/2017 0928   HGBUR negative 01/13/2010 1056   BILIRUBINUR NEGATIVE 06/23/2017 0928   BILIRUBINUR negative 02/11/2017 1709   KETONESUR NEGATIVE 06/23/2017 0928   PROTEINUR NEGATIVE 06/23/2017 0928   UROBILINOGEN 0.2 02/11/2017 1709   UROBILINOGEN 0.2 01/13/2010 1056   NITRITE NEGATIVE 06/23/2017 0928    LEUKOCYTESUR NEGATIVE 06/23/2017 0928   Sepsis Labs Invalid input(s): PROCALCITONIN,  WBC,  LACTICIDVEN Microbiology No results found for this or any previous visit (from the past 240 hour(s)).   Time coordinating discharge: 35 minutes  SIGNED:  Chipper Oman, MD  [facility] 06/25/2017, 1:30 PM  Pager please text page via  [email or web address] Password [email or web address]

## 2017-06-25 NOTE — Evaluation (Signed)
Physical Therapy Evaluation Patient Details Name: Chelsea Keller MRN: [id number] DOB: 02-Oct-1934 Today's Date: 06/25/2017   History of Present Illness  Chelsea Keller is a 82 year old female with medical history significant for hypertension, hyperlipidemia, morbid obesity, chronic venous insufficiency and congestive heart failure presented to the emergency department complaining of 1 week of progressive shortness of breath back pain. Upon ED evaluation patient was found bilateral pulmonary embolism for which she was started on heparin drip.  Workup including lower extremity Doppler and echocardiogram with no acute abnormalities. Patient complained about back pain during hospital stay, felt to be muscle spasm,    Clinical Impression  Patient presents with problems listed below.  Will benefit from acute PT to maximize functional mobility prior to discharge.  Patient declined PT this am, returned pm for PT evaluation.  Patient currently unable to bear weight on both feet due to severe pain.  Attempted scooting toward HOB in sitting, and transfers, but patient unable even with max assist.  At this point, patient unable to be discharged home safely.  Patient unable to ambulate or to transfer into w/c to get into home; or transfer to BSC/chair once inside home.  Recommend SNF at d/c for continued therapy with goal for safe d/c home at min-guard assist for mobility.     (RN notified)    Follow Up Recommendations SNF;Supervision/Assistance - 24 hour    Equipment Recommendations  Wheelchair (measurements PT);Wheelchair cushion (measurements PT)    Recommendations for Other Services       Precautions / Restrictions Precautions Precautions: Fall Restrictions Weight Bearing Restrictions: No      Mobility  Bed Mobility Overal bed mobility: Needs Assistance Bed Mobility: Supine to Sit;Sit to Supine     Supine to sit: Mod assist Sit to supine: Mod assist   General bed mobility comments: Mod  assist to raise/lower trunk for transitions.  Assist for LE's due to pain.  Assist to scoot to [facility] in supine.  Transfers Overall transfer level: Needs assistance Equipment used: Rolling walker (2 wheeled) Transfers: Sit to/from Stand Sit to Stand: Total assist         General transfer comment: Unable to bear weight on feet to allow patient to move to stance, or to laterally scoot toward HOB.  Ambulation/Gait             General Gait Details: Unable  Stairs            Wheelchair Mobility    Modified Rankin (Stroke Patients Only)       Balance                                             Pertinent Vitals/Pain Pain Assessment: 0-10 Pain Score: 9  Pain Location: Both feet with weightbearing and Rt hand Pain Descriptors / Indicators: Aching;Sharp Pain Intervention(s): Limited activity within patient's tolerance;Monitored during session;Premedicated before session;Repositioned    Home Living Family/patient expects to be discharged to:: Private residence Living Arrangements: Children Available Help at Discharge: Family;Available 24 hours/day Type of Home: House Home Access: Stairs to enter Entrance Stairs-Rails: None Entrance Stairs-Number of Steps: 2 Home Layout: Two level;Bed/bath upstairs Home Equipment: Bedside commode;Tub bench;Walker - 4 wheels;Cane - single point;Grab bars - tub/shower;Shower seat      Prior Function Level of Independence: Independent with assistive device(s)         Comments: Uses RW/cane for gait.  Able to perform ADL's and cook.     Hand Dominance        Extremity/Trunk Assessment   Upper Extremity Assessment Upper Extremity Assessment: RUE deficits/detail RUE Deficits / Details: Significant pain Rt hand impacting functional use for mobility. RUE: Unable to fully assess due to pain    Lower Extremity Assessment Lower Extremity Assessment: RLE deficits/detail;LLE deficits/detail RLE Deficits /  Details: Able to move against gravity.  Significant pain with weight bearing impacts functional mobility. RLE: Unable to fully assess due to pain LLE Deficits / Details: Able to move against gravity.  Significant pain with weight bearing impacts functional mobility. LLE: Unable to fully assess due to pain       Communication   Communication: No difficulties  Cognition Arousal/Alertness: Awake/alert Behavior During Therapy: WFL for tasks assessed/performed;Anxious Overall Cognitive Status: Within Functional Limits for tasks assessed Area of Impairment: Awareness;Safety/judgement                         Safety/Judgement: Decreased awareness of deficits     General Comments: This am, PT encouraged patient to move OOB.  Patient declined, stating she would be fine to get into home and climb stairs.  Patient evidently had significant pain in both feet on attempts to stand for d/c.  PT asked to return to f/u on prior evaluation order.  Patient originally did not see/understand importance of mobility prior to d/c home.      General Comments      Exercises     Assessment/Plan    PT Assessment Patient needs continued PT services  PT Problem List Decreased strength;Decreased activity tolerance;Decreased balance;Decreased mobility;Cardiopulmonary status limiting activity;Obesity;Pain       PT Treatment Interventions DME instruction;Gait training;Functional mobility training;Therapeutic activities;Therapeutic exercise;Balance training;Patient/family education    PT Goals (Current goals can be found in the Care Plan section)  Acute Rehab PT Goals Patient Stated Goal: To be able to walk PT Goal Formulation: With patient/family Time For Goal Achievement: 07/09/17 Potential to Achieve Goals: Good    Frequency Min 3X/week   Barriers to discharge Inaccessible home environment;Decreased caregiver support Patient with 16 stairs to bedroom.  Daughter unable to provide assist needed  by patient at this time.    Co-evaluation               AM-PAC PT "6 Clicks" Daily Activity  Outcome Measure Difficulty turning over in bed (including adjusting bedclothes, sheets and blankets)?: None Difficulty moving from lying on back to sitting on the side of the bed? : Unable Difficulty sitting down on and standing up from a chair with arms (e.g., wheelchair, bedside commode, etc,.)?: Unable Help needed moving to and from a bed to chair (including a wheelchair)?: A Lot Help needed walking in hospital room?: A Lot Help needed climbing 3-5 steps with a railing? : Total 6 Click Score: 11    End of Session   Activity Tolerance: Patient limited by pain Patient left: in bed;with call bell/phone within reach;with family/visitor present Nurse Communication: Mobility status(Unable to bear weight on feet to transfer.  Needs SNF.) PT Visit Diagnosis: Muscle weakness (generalized) (M62.81);Difficulty in walking, not elsewhere classified (R26.2);Pain Pain - part of body: Ankle and joints of foot;Hand(Bil feet;  Rt hand)    Time: 8341-9622 PT Time Calculation (min) (ACUTE ONLY): 18 min   Charges:   PT Evaluation $PT Eval Moderate Complexity: 1 Mod     PT G Codes:  Chelsea Keller Chelsea Keller, [facility] Acute Rehab Services Pager [phone]   Chelsea Keller 06/25/2017, 3:47 PM

## 2017-06-25 NOTE — Progress Notes (Signed)
Reviewed discharge instructions with patient and her daughter.  Both stated understanding.  No voiced complaints.

## 2017-06-25 NOTE — Plan of Care (Signed)
  Completed/[facility] Behavior/Discharge Planning: Ability to manage health-related needs will improve 06/25/2017 1339 - Completed/Met by Alonna Buckler, RN Clinical Measurements: Ability to maintain clinical measurements within normal limits will improve 06/25/2017 1339 - Completed/Met by Alonna Buckler, RN Will remain free from infection 06/25/2017 1339 - Completed/Met by Alonna Buckler, RN Diagnostic test results will improve 06/25/2017 1339 - Completed/Met by Alonna Buckler, RN Respiratory complications will improve 06/25/2017 1339 - Completed/Met by Alonna Buckler, RN Cardiovascular complication will be avoided 06/25/2017 1339 - Completed/Met by Alonna Buckler, RN Activity: Risk for activity intolerance will decrease 06/25/2017 1339 - Completed/Met by Alonna Buckler, RN Nutrition: Adequate nutrition will be maintained 06/25/2017 1339 - Completed/Met by Alonna Buckler, RN Coping: Level of anxiety will decrease 06/25/2017 1339 - Completed/Met by Alonna Buckler, RN Elimination: Will not experience complications related to bowel motility 06/25/2017 1339 - Completed/Met by Alonna Buckler, RN Will not experience complications related to urinary retention 06/25/2017 1339 - Completed/Met by Alonna Buckler, RN Pain Managment: General experience of comfort will improve 06/25/2017 1339 - Completed/Met by Alonna Buckler, RN Safety: Ability to remain free from injury will improve 06/25/2017 1339 - Completed/Met by Alonna Buckler, RN Skin Integrity: Risk for impaired skin integrity will decrease 06/25/2017 1339 - Completed/Met by Alonna Buckler, RN

## 2017-06-25 NOTE — Telephone Encounter (Signed)
Cindy(daughter) is calling to let you know that she took her mom to the ER on 06/22/17 and they found fluid around her heart , lungs and found three clots in lungs . She is still in the hospital . Please call if you have any questions . Thanks

## 2017-06-26 ENCOUNTER — Inpatient Hospital Stay (HOSPITAL_COMMUNITY): Payer: Medicare Other

## 2017-06-26 DIAGNOSIS — R52 Pain, unspecified: Secondary | ICD-10-CM

## 2017-06-26 MED ORDER — DEXAMETHASONE SODIUM PHOSPHATE 4 MG/ML IJ SOLN
4.0000 mg | Freq: Once | INTRAMUSCULAR | Status: DC
  Filled 2017-06-26: qty 1

## 2017-06-26 MED ORDER — DEXAMETHASONE SODIUM PHOSPHATE 4 MG/ML IJ SOLN
4.0000 mg | Freq: Once | INTRAMUSCULAR | Status: AC
  Administered 2017-06-26: 4 mg via INTRAMUSCULAR
  Filled 2017-06-26: qty 1

## 2017-06-26 MED ORDER — GABAPENTIN 300 MG PO CAPS
300.0000 mg | ORAL_CAPSULE | Freq: Three times a day (TID) | ORAL | Status: DC
  Administered 2017-06-26 – 2017-06-27 (×4): 300 mg via ORAL
  Filled 2017-06-26 (×4): qty 1

## 2017-06-26 NOTE — Progress Notes (Addendum)
PROGRESS NOTE [facility]   Chelsea Keller   TKW:[id number] DOB: October 22, 1934  DOA: 06/23/2017 PCP: Venia Carbon, MD   Brief Narrative:  Chelsea Keller is a 82 year old female with medical history significant for hypertension, hyperlipidemia, morbid obesity, chronic venous insufficiency and congestive heart failure presented to the emergency department complaining of 1 week of progressive shortness of breath back pain.  Upon ED evaluation patient was found bilateral pulmonary embolism for which she was started on heparin drip for further monitoring.  Workup including lower extremity Doppler and echocardiogram has been negative so far. Patient was switched to Eliquis. Upon PT evaluation patient reported unable to walk and was recommended SNF for rehab   Subjective: Patient was discharged yesterday, but patient concern about multi joint pain. She was evaluated by PT whom recommended SNF. Today c/o pain on her left foot, bottom and R wrist with mild swelling. Xray reveals severe osteoarthritis.   Assessment & Plan: Pulmonary embolism bilaterally Unprovoked, no hx of recent travel, no recent hospitalization, no cancer. Patient is very sedentary due to obesity   On Eliquis  Lower extremity ultrasound negative for DVT Echocardiogram shows no heart strain  Hypertension BP stable Continue home medication  Chronic back pain, muscle spasm, fibromyalgia, severe osteoarthritis  PT recommending SNF  Since this is polyarticular disease will give OTO decadron. Patient had mild increase in Cr. Case use NSAID's as needed  Added gabapentin - I suspect a psych component as well.  Pending SNF placement    Chronic kidney disease stage III Creatinine stable at baseline. To need to monitor  Morbid obesity Follow-up as an outpatient  Sacral pressure ulcer stage II POA  Wound care per nursing   DVT prophylaxis: Heparin drip Code Status: Full code Family Communication: None at bedside    Disposition Plan: SNF when bed available    Consultants:   None  Procedures:   Echocardiogram  Antimicrobials:  None   Objective: Vitals:   06/25/17 0949 06/25/17 2020 06/26/17 0426 06/26/17 1226  BP: (!) 167/65 (!) 157/66 (!) 155/60 (!) 172/70  Pulse: 85 88 82 88  Resp:  20 20 18   Temp:  99.1 F (37.3 C) 98.7 F (37.1 C) 99.6 F (37.6 C)  TempSrc:  Oral Oral Oral  SpO2:  94% 94% 97%  Weight:   120.3 kg (265 lb 3.4 oz)   Height:        Intake/Output Summary (Last 24 hours) at 06/26/2017 1621 Last data filed at 06/26/2017 1224 Gross per 24 hour  Intake 480 ml  Output 500 ml  Net -20 ml   Filed Weights   06/24/17 0346 06/25/17 0424 06/26/17 0426  Weight: 120.7 kg (266 lb 1.5 oz) 120.3 kg (265 lb 3.4 oz) 120.3 kg (265 lb 3.4 oz)    Examination:  General: Anxious  Cardiovascular: RRR, S1/S2 +, no rubs, no gallops Respiratory: CTA bilaterally, no wheezing, no rhonchi Abdominal: Soft, NT, ND, bowel sounds + Extremities: R wrist mild swelling and tender, unable to grasp due to pain, no weakness. No LE weakness. Strength 5/5 in LE. Pain at the bottom of the L foot tenderness at between 1st and 2nd toe.   Data Reviewed: I have personally reviewed following labs and imaging studies  CBC: Recent Labs  Lab 06/22/17 2016 06/24/17 0254 06/25/17 0534  WBC 10.9* 12.2* 12.2*  HGB 11.2* 11.1* 11.2*  HCT 35.9* 35.6* 36.2  MCV 85.5 85.4 86.4  PLT 281 265 924   Basic Metabolic Panel: Recent Labs  Lab 06/22/17 2016 06/24/17 0254 06/25/17 0534  NA 139 137 136  K 3.7 3.9 4.2  CL 104 103 104  CO2 27 25 22   GLUCOSE 106* 114* 110*  BUN 13 17 20   CREATININE 1.00 1.11* 1.05*  CALCIUM 9.3 8.8* 9.0   GFR: Estimated Creatinine Clearance: 52.8 mL/min (A) (by C-G formula based on SCr of 1.05 mg/dL (H)). Liver Function Tests: Recent Labs  Lab 06/24/17 0254  AST 20  ALT 12*  ALKPHOS 80  BILITOT 1.0  PROT 5.8*  ALBUMIN 3.2*   No results for input(s): LIPASE,  AMYLASE in the last 168 hours. No results for input(s): AMMONIA in the last 168 hours. Coagulation Profile: Recent Labs  Lab 06/23/17 1020  INR 1.35   Cardiac Enzymes: No results for input(s): CKTOTAL, CKMB, CKMBINDEX, TROPONINI in the last 168 hours. BNP (last 3 results) No results for input(s): PROBNP in the last 8760 hours. HbA1C: No results for input(s): HGBA1C in the last 72 hours. CBG: Recent Labs  Lab 06/23/17 0942  GLUCAP 113*   Lipid Profile: No results for input(s): CHOL, HDL, LDLCALC, TRIG, CHOLHDL, LDLDIRECT in the last 72 hours. Thyroid Function Tests: No results for input(s): TSH, T4TOTAL, FREET4, T3FREE, THYROIDAB in the last 72 hours. Anemia Panel: No results for input(s): VITAMINB12, FOLATE, FERRITIN, TIBC, IRON, RETICCTPCT in the last 72 hours. Sepsis Labs: No results for input(s): PROCALCITON, LATICACIDVEN in the last 168 hours.  No results found for this or any previous visit (from the past 240 hour(s)).    Radiology Studies: Dg Hand 2 View Right  Result Date: 06/26/2017 CLINICAL DATA:  Right hand pain without known injury. EXAM: RIGHT HAND - 2 VIEW COMPARISON:  None. FINDINGS: There is no evidence of fracture or dislocation. Severe degenerative changes seen involving the first carpometacarpal joint. Hypertrophy and narrowing is seen involving the distal interphalangeal joints of the second, third, fourth and fifth fingers as well as the fifth proximal interphalangeal joint. Soft tissues are unremarkable. IMPRESSION: Findings consistent with osteoarthritis involving multiple joints. No acute abnormality seen in the right hand. Electronically Signed   By: Marijo Conception, M.D.   On: 06/26/2017 13:19   Dg Foot 2 Views Left  Result Date: 06/26/2017 CLINICAL DATA:  Left foot pain and swelling.  No known injury. EXAM: LEFT FOOT - 2 VIEW COMPARISON:  None. FINDINGS: No acute bony or joint abnormality is identified. Tibiotalar degenerative change is seen. Bones  appear mildly osteopenic. There may be a remote medullary infarct in the distal tibia. IMPRESSION: No acute abnormality. Tibiotalar osteoarthritis. Possible remote medullary infarct distal tibia noted. Electronically Signed   By: Inge Rise M.D.   On: 06/26/2017 13:18      Scheduled Meds: . apixaban  10 mg Oral BID   Followed by  . [START ON 07/02/2017] apixaban  5 mg Oral BID  . dexamethasone  4 mg Intravenous Once  . gabapentin  300 mg Oral TID  . lidocaine  1 patch Transdermal Q24H  . potassium chloride SA  40 mEq Oral Daily  . triamterene-hydrochlorothiazide  1 tablet Oral Daily   Continuous Infusions:    LOS: 2 days    Time spent: Total of 25 minutes spent with pt, greater than 50% of which was spent in discussion of  treatment, counseling and coordination of care   Chipper Oman, MD Pager: Text Page via [email or web address]   If 7PM-7AM, please contact night-coverage [email or web address] 06/26/2017, 4:21 PM

## 2017-06-26 NOTE — Progress Notes (Signed)
Physical Therapy Treatment Patient Details Name: Chelsea Keller MRN: [id number] DOB: [birth year] Today's Date: 06/26/2017    History of Present Illness Chelsea Keller is a 82 year old female with medical history significant for hypertension, hyperlipidemia, morbid obesity, chronic venous insufficiency and congestive heart failure presented to the emergency department complaining of 1 week of progressive shortness of breath back pain. Upon ED evaluation patient was found bilateral pulmonary embolism for which she was started on heparin drip.  Workup including lower extremity Doppler and echocardiogram with no acute abnormalities. Patient complained about back pain during hospital stay, felt to be muscle spasm,    PT Comments    Pain continues to limit patient's mobility.  Continue to recommend SNF at d/c.   Follow Up Recommendations  SNF;Supervision/Assistance - 24 hour     Equipment Recommendations  Wheelchair (measurements PT);Wheelchair cushion (measurements PT)    Recommendations for Other Services       Precautions / Restrictions Precautions Precautions: Fall Restrictions Weight Bearing Restrictions: No    Mobility  Bed Mobility Overal bed mobility: Needs Assistance Bed Mobility: Supine to Sit;Sit to Supine     Supine to sit: Mod assist;Max assist Sit to supine: Max assist;+2 for physical assistance   General bed mobility comments: Encouraged patient to roll and move side to sit.  Patient unable to do so.  Required mod to max assist to bring LE's off of bed and raise trunk to sitting.  Required +2 max assist to return to supine using bed pad.  Patient unable to scoot laterally in sitting to move to [facility], so used pad to scoot hips up as patient lowered trunk.  Transfers Overall transfer level: Needs assistance Equipment used: Rolling walker (2 wheeled) Transfers: Sit to/from Stand Sit to Stand: Max assist;Total assist         General transfer comment: Unable to bear  weight on feet to allow patient to move to stance, or to laterally scoot toward HOB.  Ambulation/Gait                 Stairs            Wheelchair Mobility    Modified Rankin (Stroke Patients Only)       Balance Overall balance assessment: Needs assistance Sitting-balance support: No upper extremity supported;Feet supported Sitting balance-Leahy Scale: Fair                                      Cognition Arousal/Alertness: Awake/alert Behavior During Therapy: WFL for tasks assessed/performed;Anxious Overall Cognitive Status: Within Functional Limits for tasks assessed Area of Impairment: Safety/judgement                         Safety/Judgement: Decreased awareness of deficits     General Comments: Patient reports pain in both hands.  Yesterday, she reported pain in Rt hand only.  Today she states "It's always been in both hands."      Exercises      General Comments General comments (skin integrity, edema, etc.): Noted area of redness on Rt lower leg, tender to touch.      Pertinent Vitals/Pain Pain Assessment: 0-10 Pain Score: 8  Pain Location: Both feet; Both hands, R>L Pain Descriptors / Indicators: Aching;Sharp Pain Intervention(s): Limited activity within patient's tolerance;Monitored during session;Repositioned    Home Living  Prior Function            PT Goals (current goals can now be found in the care plan section) Acute Rehab PT Goals Patient Stated Goal: To be able to walk Progress towards PT goals: Not progressing toward goals - comment(Limited by pain)    Frequency    Min 3X/week      PT Plan Current plan remains appropriate    Co-evaluation              AM-PAC PT "6 Clicks" Daily Activity  Outcome Measure  Difficulty turning over in bed (including adjusting bedclothes, sheets and blankets)?: A Little Difficulty moving from lying on back to sitting on the  side of the bed? : Unable Difficulty sitting down on and standing up from a chair with arms (e.g., wheelchair, bedside commode, etc,.)?: Unable Help needed moving to and from a bed to chair (including a wheelchair)?: A Lot Help needed walking in hospital room?: A Lot Help needed climbing 3-5 steps with a railing? : Total 6 Click Score: 10    End of Session   Activity Tolerance: Patient limited by pain Patient left: in bed;with call bell/phone within reach;with bed alarm set   PT Visit Diagnosis: Muscle weakness (generalized) (M62.81);Difficulty in walking, not elsewhere classified (R26.2);Pain Pain - part of body: Ankle and joints of foot;Hand(Bil feet and hands)     Time: 3976-7341 PT Time Calculation (min) (ACUTE ONLY): 23 min  Charges:  $Therapeutic Activity: 23-37 mins                    G Codes:       Carita Pian. Sanjuana Kava, [facility] Acute Rehab Services Pager [place] 06/26/2017, 3:42 PM

## 2017-06-26 NOTE — Progress Notes (Signed)
Exit care education provided to pt and pt daughter regarding decadron and gabapentin

## 2017-06-26 NOTE — Progress Notes (Signed)
Informed social worker, Caryl Pina, of SNF referral

## 2017-06-26 NOTE — Progress Notes (Signed)
MD Quincy Simmonds called. Stated pt is alert and oriented and he will not give update to family. MD stated pt can update family.

## 2017-06-26 NOTE — Progress Notes (Signed)
Pt family [name] called, stated this is not pt baseline. Pt family concerned requesting to speak to MD. Paged MD to call family for update.

## 2017-06-26 NOTE — Progress Notes (Signed)
Pt family refusing IV for pt to receive medication  Paged MD to change order to IM

## 2017-06-26 NOTE — Progress Notes (Signed)
Informed pt to call her daughter for an update

## 2017-06-27 DIAGNOSIS — M159 Polyosteoarthritis, unspecified: Secondary | ICD-10-CM

## 2017-06-27 LAB — CBC WITH DIFFERENTIAL/PLATELET
BASOS PCT: 0 %
Basophils Absolute: 0 10*3/uL (ref 0.0–0.1)
EOS ABS: 0 10*3/uL (ref 0.0–0.7)
EOS PCT: 0 %
HCT: 37.8 % (ref 36.0–46.0)
HEMOGLOBIN: 11.7 g/dL — AB (ref 12.0–15.0)
Lymphocytes Relative: 10 %
Lymphs Abs: 1.6 10*3/uL (ref 0.7–4.0)
MCH: 26.6 pg (ref 26.0–34.0)
MCHC: 31 g/dL (ref 30.0–36.0)
MCV: 85.9 fL (ref 78.0–100.0)
MONO ABS: 0.9 10*3/uL (ref 0.1–1.0)
MONOS PCT: 5 %
NEUTROS PCT: 85 %
Neutro Abs: 14.5 10*3/uL — ABNORMAL HIGH (ref 1.7–7.7)
PLATELETS: 276 10*3/uL (ref 150–400)
RBC: 4.4 MIL/uL (ref 3.87–5.11)
RDW: 16.6 % — AB (ref 11.5–15.5)
WBC: 17.1 10*3/uL — ABNORMAL HIGH (ref 4.0–10.5)

## 2017-06-27 LAB — BASIC METABOLIC PANEL
Anion gap: 10 (ref 5–15)
BUN: 27 mg/dL — AB (ref 6–20)
CALCIUM: 9.3 mg/dL (ref 8.9–10.3)
CO2: 23 mmol/L (ref 22–32)
CREATININE: 1.11 mg/dL — AB (ref 0.44–1.00)
Chloride: 100 mmol/L — ABNORMAL LOW (ref 101–111)
GFR calc non Af Amer: 46 mL/min — ABNORMAL LOW (ref >60)
GFR, EST AFRICAN AMERICAN: 53 mL/min — AB (ref >60)
Glucose, Bld: 135 mg/dL — ABNORMAL HIGH (ref 65–99)
Potassium: 4.3 mmol/L (ref 3.5–5.1)
Sodium: 133 mmol/L — ABNORMAL LOW (ref 135–145)

## 2017-06-27 MED ORDER — PREDNISONE 20 MG PO TABS
20.0000 mg | ORAL_TABLET | Freq: Once | ORAL | Status: AC
  Administered 2017-06-27: 20 mg via ORAL
  Filled 2017-06-27: qty 1

## 2017-06-27 NOTE — Progress Notes (Addendum)
PROGRESS NOTE [facility]   Latifah Padin   JAS:[id number] DOB: 20-Nov-1934  DOA: 06/23/2017 PCP: Venia Carbon, MD   Brief Narrative:  Chelsea Keller is a 82 year old female with medical history significant for hypertension, hyperlipidemia, morbid obesity, chronic venous insufficiency and congestive heart failure presented to the emergency department complaining of 1 week of progressive shortness of breath back pain.  Upon ED evaluation patient was found bilateral pulmonary embolism for which she was started on heparin drip. Workup including lower extremity Doppler and echocardiogram has been negative. Patient was switched to Eliquis. Upon PT evaluation patient reported unable to walk d/t pain and was recommended SNF for rehab   Subjective: Patient reports pain in small joints of her feet and hands which are new for her. These are somewhat better. She was also started on gabapentin but denies history of peripheral neuropathy or diabetes. Reports that she has not been out of bed much since hospital admission. Prior to that lives with spouse and ambulates with walker including navigating stairs at home.  Assessment & Plan: Pulmonary embolism bilaterally Unprovoked, no hx of recent travel, no recent hospitalization, no cancer. Patient is very sedentary due to obesity   Briefly on IV heparin infusion and then transitioned to oral anticoagulation. On Eliquis  Lower extremity ultrasound negative for DVT Echocardiogram shows no heart strain  Hypertension BP stable Continue home medication  Chronic back pain, muscle spasm, fibromyalgia, severe osteoarthritis  - PT recommending SNF  - Patient denies history of peripheral neuropathy or diabetes. Remotely has had gout but current pain not similar to same. - X-rays off right hand and left foot consistent with osteoarthritis. - Current worsening pain may be related to osteoarthritis flare. Feels somewhat better after dose of IM Decadron  on 12/22. Patient reluctant to use any medications except Tylenol.  - Reports nausea with NSAIDs. Has used tramadol remotely. - Since pain is not neuropathic, DC gabapentin.   - Continue when necessary Tylenol for pain and will give a dose of prednisone 20 mg and monitor response.  Chronic kidney disease stage III Creatinine stable at baseline. To need to monitor  Morbid obesity Follow-up as an outpatient  Sacral pressure ulcer stage II POA  Wound care per nursing   Anemia - Stable. Follow CBCs periodically.  DVT prophylaxis: Heparin drip> Eliquis Code Status: Full code Family Communication: Discussed in detail with patient's daughter at bedside. Disposition Plan: SNF when bed available . Social worker input pending.   Consultants:   None  Procedures:   Echocardiogram  Antimicrobials:  None   Objective: Vitals:   06/26/17 1226 06/26/17 2032 06/27/17 0525 06/27/17 1100  BP: (!) 172/70 (!) 149/74 121/68 (!) 144/60  Pulse: 88 79 70 91  Resp: 18 18 18 18   Temp: 99.6 F (37.6 C) 99 F (37.2 C) 97.6 F (36.4 C) 97.7 F (36.5 C)  TempSrc: Oral Oral Oral Oral  SpO2: 97% 94% 100% 91%  Weight:   117.6 kg (259 lb 3.2 oz)   Height:        Intake/Output Summary (Last 24 hours) at 06/27/2017 1710 Last data filed at 06/27/2017 1431 Gross per 24 hour  Intake 1200 ml  Output 0 ml  Net 1200 ml   Filed Weights   06/25/17 0424 06/26/17 0426 06/27/17 0525  Weight: 120.3 kg (265 lb 3.4 oz) 120.3 kg (265 lb 3.4 oz) 117.6 kg (259 lb 3.2 oz)    Examination:  General: Elderly female, moderately built and nourished, lying comfortably propped  up in bed. Family and friends at bedside. Cardiovascular: RRR, S1/S2 +, no rubs, no gallops.  Respiratory: CTA bilaterally, no wheezing, no rhonchi. No increased work of breathing. Abdominal: Soft, NT, ND, bowel sounds + Extremities: Chronic arthritic changes of small joints of her hands. Small joints of her right hand are mildly  warm, tender to touch with painful range of motion movements. No acute findings in joints of her left hand or both feet.  Data Reviewed: I have personally reviewed following labs and imaging studies  CBC: Recent Labs  Lab 06/22/17 2016 06/24/17 0254 06/25/17 0534 06/27/17 0437  WBC 10.9* 12.2* 12.2* 17.1*  NEUTROABS  --   --   --  14.5*  HGB 11.2* 11.1* 11.2* 11.7*  HCT 35.9* 35.6* 36.2 37.8  MCV 85.5 85.4 86.4 85.9  PLT 281 265 266 657   Basic Metabolic Panel: Recent Labs  Lab 06/22/17 2016 06/24/17 0254 06/25/17 0534 06/27/17 0437  NA 139 137 136 133*  K 3.7 3.9 4.2 4.3  CL 104 103 104 100*  CO2 27 25 22 23   GLUCOSE 106* 114* 110* 135*  BUN 13 17 20  27*  CREATININE 1.00 1.11* 1.05* 1.11*  CALCIUM 9.3 8.8* 9.0 9.3   GFR: Estimated Creatinine Clearance: 49.2 mL/min (A) (by C-G formula based on SCr of 1.11 mg/dL (H)). Liver Function Tests: Recent Labs  Lab 06/24/17 0254  AST 20  ALT 12*  ALKPHOS 80  BILITOT 1.0  PROT 5.8*  ALBUMIN 3.2*   Coagulation Profile: Recent Labs  Lab 06/23/17 1020  INR 1.35    CBG: Recent Labs  Lab 06/23/17 0942  GLUCAP 113*     Radiology Studies: Dg Hand 2 View Right  Result Date: 06/26/2017 CLINICAL DATA:  Right hand pain without known injury. EXAM: RIGHT HAND - 2 VIEW COMPARISON:  None. FINDINGS: There is no evidence of fracture or dislocation. Severe degenerative changes seen involving the first carpometacarpal joint. Hypertrophy and narrowing is seen involving the distal interphalangeal joints of the second, third, fourth and fifth fingers as well as the fifth proximal interphalangeal joint. Soft tissues are unremarkable. IMPRESSION: Findings consistent with osteoarthritis involving multiple joints. No acute abnormality seen in the right hand. Electronically Signed   By: Marijo Conception, M.D.   On: 06/26/2017 13:19   Dg Foot 2 Views Left  Result Date: 06/26/2017 CLINICAL DATA:  Left foot pain and swelling.  No known  injury. EXAM: LEFT FOOT - 2 VIEW COMPARISON:  None. FINDINGS: No acute bony or joint abnormality is identified. Tibiotalar degenerative change is seen. Bones appear mildly osteopenic. There may be a remote medullary infarct in the distal tibia. IMPRESSION: No acute abnormality. Tibiotalar osteoarthritis. Possible remote medullary infarct distal tibia noted. Electronically Signed   By: Inge Rise M.D.   On: 06/26/2017 13:18      Scheduled Meds: . apixaban  10 mg Oral BID   Followed by  . [START ON 07/02/2017] apixaban  5 mg Oral BID  . gabapentin  300 mg Oral TID  . lidocaine  1 patch Transdermal Q24H  . potassium chloride SA  40 mEq Oral Daily  . triamterene-hydrochlorothiazide  1 tablet Oral Daily   Continuous Infusions:    LOS: 3 days   Vernell Leep, MD, FACP, [facility]. [facility] Pager [phone]  If 7PM-7AM, please contact night-coverage [email or web address] Password [email or web address] 06/27/2017, 5:20 PM

## 2017-06-27 NOTE — Progress Notes (Signed)
Family at bedside, updated per pt request.

## 2017-06-28 ENCOUNTER — Ambulatory Visit: Payer: Medicare Other | Admitting: Internal Medicine

## 2017-06-28 DIAGNOSIS — N183 Chronic kidney disease, stage 3 (moderate): Secondary | ICD-10-CM | POA: Diagnosis not present

## 2017-06-28 DIAGNOSIS — M797 Fibromyalgia: Secondary | ICD-10-CM | POA: Diagnosis not present

## 2017-06-28 DIAGNOSIS — M159 Polyosteoarthritis, unspecified: Secondary | ICD-10-CM | POA: Diagnosis not present

## 2017-06-28 DIAGNOSIS — I2699 Other pulmonary embolism without acute cor pulmonale: Secondary | ICD-10-CM | POA: Diagnosis not present

## 2017-06-28 DIAGNOSIS — M62838 Other muscle spasm: Secondary | ICD-10-CM | POA: Diagnosis not present

## 2017-06-28 DIAGNOSIS — M545 Low back pain: Secondary | ICD-10-CM | POA: Diagnosis not present

## 2017-06-28 DIAGNOSIS — I10 Essential (primary) hypertension: Secondary | ICD-10-CM | POA: Diagnosis not present

## 2017-06-28 LAB — CBC
HEMATOCRIT: 37.2 % (ref 36.0–46.0)
HEMOGLOBIN: 11.5 g/dL — AB (ref 12.0–15.0)
MCH: 26.6 pg (ref 26.0–34.0)
MCHC: 30.9 g/dL (ref 30.0–36.0)
MCV: 86.1 fL (ref 78.0–100.0)
Platelets: 355 10*3/uL (ref 150–400)
RBC: 4.32 MIL/uL (ref 3.87–5.11)
RDW: 16.4 % — ABNORMAL HIGH (ref 11.5–15.5)
WBC: 17.3 10*3/uL — ABNORMAL HIGH (ref 4.0–10.5)

## 2017-06-28 LAB — BASIC METABOLIC PANEL
Anion gap: 7 (ref 5–15)
BUN: 38 mg/dL — AB (ref 6–20)
CHLORIDE: 104 mmol/L (ref 101–111)
CO2: 26 mmol/L (ref 22–32)
CREATININE: 1.06 mg/dL — AB (ref 0.44–1.00)
Calcium: 9.5 mg/dL (ref 8.9–10.3)
GFR calc non Af Amer: 48 mL/min — ABNORMAL LOW (ref >60)
GFR, EST AFRICAN AMERICAN: 56 mL/min — AB (ref >60)
Glucose, Bld: 130 mg/dL — ABNORMAL HIGH (ref 65–99)
Potassium: 4.6 mmol/L (ref 3.5–5.1)
Sodium: 137 mmol/L (ref 135–145)

## 2017-06-28 MED ORDER — PREDNISONE 20 MG PO TABS: 20.0000 mg | ORAL_TABLET | Freq: Every day | ORAL | Status: AC

## 2017-06-28 MED ORDER — SENNOSIDES-DOCUSATE SODIUM 8.6-50 MG PO TABS: 1.0000 | ORAL_TABLET | Freq: Every evening | ORAL | Status: DC | PRN

## 2017-06-28 MED ORDER — ACETAMINOPHEN 325 MG PO TABS: 650.0000 mg | ORAL_TABLET | Freq: Four times a day (QID) | ORAL | Status: DC | PRN

## 2017-06-28 MED ORDER — ELIQUIS 5 MG VTE STARTER PACK: ORAL_TABLET | ORAL | Status: DC

## 2017-06-28 MED ORDER — PREDNISONE 20 MG PO TABS: 20.0000 mg | ORAL_TABLET | Freq: Once | ORAL | Status: DC

## 2017-06-28 MED ORDER — OXYCODONE HCL 5 MG PO TABS: 5.0000 mg | ORAL_TABLET | Freq: Two times a day (BID) | ORAL | 0 refills | Status: DC | PRN

## 2017-06-28 NOTE — Discharge Summary (Signed)
Physician Discharge Summary  Chelsea Keller  GYJ:[id number]  DOB: 07-12-1934  DOA: 06/23/2017 PCP: Venia Carbon, MD  Admit date: 06/23/2017 Discharge date: 06/28/2017  Admitted From: Home  Disposition:  SNF  Recommendations for Outpatient Follow-up:  1. M.D. at SNF in 3 days with repeat labs (CBC & BMP). 2. Dr. Viviana Simpler, PCP upon discharge from SNF. 3. On NOAC for at least 6 month, may consider repeat CTA to re-evaluate for PE   Discharge Condition: Improved and stable CODE STATUS:Full Code   Diet recommendation: Heart Healthy / Carb Modified   Brief/Interim Summary: Chelsea Keller is a 82 year old female with medical history significant for hypertension, hyperlipidemia, morbid obesity, chronic venous insufficiency and congestive heart failure presented to the emergency department complaining of 1 week of progressive shortness of breath back pain. Upon ED evaluation patient was found to have bilateral pulmonary embolism for which she was started on heparin drip.  Workup including lower extremity Doppler and echocardiogram were negative. Patient was switched to Eliquis. Upon PT evaluation patient reported unable to walk d/t pain in her feet and was recommended SNF for rehab     Subjective: Patient reports pain in her hands and feet are much better. Reports history of arthritis in the past for which she had seen rheumatology and states that she was told she has "100 types of arthritis". She briefly was on gold therapy but developed skin rash and had to see dermatology. She was on oral prednisone, undetermined dose for almost a year several years ago.  Discharge Diagnoses/Hospital Course:   Pulmonary embolism bilaterally Unprovoked, no hx of recent travel, no recent hospitalization, no cancer. Patient is very sedentary due to obesity   Treated with heparin ggt for 36 hrs, then transitioned to Eliquis of which she has completed 3.5 days and thus far in the hospital. Will need at  least 6 month, likely will need lifelong. This determination to be made during outpatient follow-up and may consider hematology consultation for further workup.   Lower extremity ultrasound negative for DVT Echocardiogram shows no right heart strain  Hypertension BP stable Continue home medication  Chronic back pain, muscle spasm, fibromyalgia, severe osteoarthritis  - PT recommending SNF  - Patient denies history of peripheral neuropathy or diabetes. Remotely has had gout but current pain not similar to same. - No further reports of back pain or spasm. - X-rays off right hand and left foot consistent with osteoarthritis. - Current worsening pain may be related to osteoarthritis flare. Patient reluctant to use any medications except Tylenol. Reports nausea with NSAIDs. Has used tramadol remotely. - Since pain is not neuropathic, discontinued gabapentin.   - Treated with 2 days of steroids/IM Decadron then oral prednisone with good response. Will continue additional 3 days of oral prednisone 20 MG daily. - Recommend rheumatology consultation and follow-up as outpatient. Leukocytosis is likely related to steroids.  Chronic kidney disease stage III Creatinine normal/1.06 today.  Morbid obesity Follow-up as outpatient.  Sacral pressure ulcer stage II POA  Wound care per nursing   Anemia - Stable. Follow CBCs periodically.   On the day of the discharge the patient's vitals were stable, and no other acute medical condition were reported by patient. the patient was felt safe to be discharge to SNF.   Discharge Instructions  You were cared for by a hospitalist during your hospital stay. If you have any questions about your discharge medications or the care you received while you were in the hospital after you are  discharged, you can call the unit and asked to speak with the hospitalist on call if the hospitalist that took care of you is not available. Once you are discharged, your  primary care physician will handle any further medical issues. Please note that NO REFILLS for any discharge medications will be authorized once you are discharged, as it is imperative that you return to your primary care physician (or establish a relationship with a primary care physician if you do not have one) for your aftercare needs so that they can reassess your need for medications and monitor your lab values.  Discharge Instructions    Call MD for:  difficulty breathing, headache or visual disturbances   Complete by:  As directed    Call MD for:  extreme fatigue   Complete by:  As directed    Call MD for:  hives   Complete by:  As directed    Call MD for:  persistant dizziness or light-headedness   Complete by:  As directed    Call MD for:  persistant nausea and vomiting   Complete by:  As directed    Call MD for:  redness, tenderness, or signs of infection (pain, swelling, redness, odor or green/yellow discharge around incision site)   Complete by:  As directed    Call MD for:  severe uncontrolled pain   Complete by:  As directed    Call MD for:  temperature >100.4   Complete by:  As directed    Diet - low sodium heart healthy   Complete by:  As directed    Increase activity slowly   Complete by:  As directed      Allergies as of 06/28/2017      Reactions   Codeine Sulfate Shortness Of Breath   Celecoxib Other (See Comments)   Pt bled out   Erythromycin Base Nausea And Vomiting      Medication List    STOP taking these medications   fluconazole 100 MG tablet Commonly known as:  DIFLUCAN   oxyCODONE-acetaminophen 5-325 MG tablet Commonly known as:  PERCOCET/ROXICET   senna 8.6 MG Tabs tablet Commonly known as:  SENOKOT   sulfamethoxazole-trimethoprim 800-160 MG tablet Commonly known as:  BACTRIM DS,SEPTRA DS     TAKE these medications   acetaminophen 325 MG tablet Commonly known as:  TYLENOL Take 2 tablets (650 mg total) by mouth every 6 (six) hours as  needed for mild pain. What changed:    medication strength  how much to take  when to take this  reasons to take this   ELIQUIS STARTER PACK 5 MG Tabs Take as directed on package: start with two-5mg  tablets twice daily for 7 days. On day 8, switch to one-5mg  tablet twice daily.  Note to SNF pharmacy: In the hospital, Patient has thus far completed 3.5 of the 7 days of 10 mg BID dosing.   oxyCODONE 5 MG immediate release tablet Commonly known as:  Oxy IR/ROXICODONE Take 1 tablet (5 mg total) by mouth every 12 (twelve) hours as needed for moderate pain or severe pain.   potassium chloride SA 20 MEQ tablet Commonly known as:  K-DUR,KLOR-CON TAKE 2 TABLETS (40 MEQ TOTAL) DAILY   predniSONE 20 MG tablet Commonly known as:  DELTASONE Take 1 tablet (20 mg total) by mouth daily with breakfast for 3 days. For 3 days beginning 06/29/17. Start taking on:  06/29/2017   senna-docusate 8.6-50 MG tablet Commonly known as:  Senokot-S Take 1 tablet  by mouth at bedtime as needed for mild constipation.   triamterene-hydrochlorothiazide 37.5-25 MG tablet Commonly known as:  MAXZIDE-25 Take 1 tablet by mouth daily.      Follow-up Information    Venia Carbon, MD. Schedule an appointment as soon as possible for a visit.   Specialties:  Internal Medicine, Pediatrics Why:  Upon discharge from SNF. Contact information: [place] [address] [phone]        M.D. at SNF. Schedule an appointment as soon as possible for a visit in 3 day(s).   Why:  To be seen with repeat labs (CBC & BMP).         Allergies  Allergen Reactions  . Codeine Sulfate Shortness Of Breath  . Celecoxib Other (See Comments)    Pt bled out  . Erythromycin Base Nausea And Vomiting    Consultations:  None    Procedures/Studies: X-ray Chest Pa And Lateral  Result Date: 06/24/2017 CLINICAL DATA:  Chest pain and back pain.  Pulmonary emboli. EXAM: CHEST  2 VIEW COMPARISON:   06/22/2017 FINDINGS: Heart size remains normal. Aortic atherosclerosis. Lungs remain clear. No significant atelectasis. No infiltrate or effusion. Ordinary degenerative changes affect the spine. IMPRESSION: No active disease by radiography.  No significant atelectasis. Electronically Signed   By: Nelson Chimes M.D.   On: 06/24/2017 09:05   Dg Chest 2 View  Result Date: 06/22/2017 CLINICAL DATA:  Shortness of breath EXAM: CHEST  2 VIEW COMPARISON:  09/04/2014 FINDINGS: Chronic cardiomegaly. Prominent main pulmonary artery contour. Vascular congestion. No Kerley lines or effusion. No air bronchogram or pneumothorax. IMPRESSION: Cardiomegaly and vascular congestion.  No edema or pneumonia. Electronically Signed   By: Monte Fantasia M.D.   On: 06/22/2017 21:00   Ct Angio Chest Pe W And/or Wo Contrast  Result Date: 06/23/2017 CLINICAL DATA:  Short of breath EXAM: CT ANGIOGRAPHY CHEST WITH CONTRAST TECHNIQUE: Multidetector CT imaging of the chest was performed using the standard protocol during bolus administration of intravenous contrast. Multiplanar CT image reconstructions and MIPs were obtained to evaluate the vascular anatomy. CONTRAST:  138mL ISOVUE-370 IOPAMIDOL (ISOVUE-370) INJECTION 76% COMPARISON:  06/22/2017 FINDINGS: Cardiovascular: Extensive pulmonary embolism. Large volume pulmonary embolism with clot in the upper and lower lobes bilaterally. Pulmonary arteries mildly enlarged. RV/LV ratio 0.8. Negative for right heart strain. Coronary calcification. Atherosclerotic calcification aortic arch without aneurysm or dissection. Heart size upper normal. Mediastinum/Nodes: Negative Lungs/Pleura: Lungs are clear.  No effusion. Upper Abdomen: Negative Musculoskeletal: Negative Review of the MIP images confirms the above findings. IMPRESSION: Extensive bilateral pulmonary embolism without right heart strain. The lungs are clear Aortic Atherosclerosis (ICD10-I70.0). These results were called by telephone at  the time of interpretation on 06/23/2017 at 8:42 am to Dr. Johnney Killian, who verbally acknowledged these results. Electronically Signed   By: Franchot Gallo M.D.   On: 06/23/2017 08:43   Dg Hand 2 View Right  Result Date: 06/26/2017 CLINICAL DATA:  Right hand pain without known injury. EXAM: RIGHT HAND - 2 VIEW COMPARISON:  None. FINDINGS: There is no evidence of fracture or dislocation. Severe degenerative changes seen involving the first carpometacarpal joint. Hypertrophy and narrowing is seen involving the distal interphalangeal joints of the second, third, fourth and fifth fingers as well as the fifth proximal interphalangeal joint. Soft tissues are unremarkable. IMPRESSION: Findings consistent with osteoarthritis involving multiple joints. No acute abnormality seen in the right hand. Electronically Signed   By: Marijo Conception, M.D.   On: 06/26/2017  13:19   Dg Foot 2 Views Left  Result Date: 06/26/2017 CLINICAL DATA:  Left foot pain and swelling.  No known injury. EXAM: LEFT FOOT - 2 VIEW COMPARISON:  None. FINDINGS: No acute bony or joint abnormality is identified. Tibiotalar degenerative change is seen. Bones appear mildly osteopenic. There may be a remote medullary infarct in the distal tibia. IMPRESSION: No acute abnormality. Tibiotalar osteoarthritis. Possible remote medullary infarct distal tibia noted. Electronically Signed   By: Inge Rise M.D.   On: 06/26/2017 13:18   ECHO 06/23/17 ------------------------------------------------------------------- Study Conclusions  - Left ventricle: The cavity size was normal. Systolic function was   normal. The estimated ejection fraction was in the range of 55%   to 60%. Wall motion was normal; there were no regional wall   motion abnormalities. Doppler parameters are consistent with   abnormal left ventricular relaxation (grade 1 diastolic   dysfunction). - Aortic valve: There was trivial regurgitation. - Mitral valve: Calcified annulus.  Mildly thickened leaflets . - Atrial septum: No defect or patent foramen ovale was identified. - Pulmonary arteries: PA peak pressure: 32 mm Hg (S).   Discharge Exam:  Vitals:   06/27/17 1100 06/27/17 2034 06/28/17 0554 06/28/17 1300  BP: (!) 144/60 (!) 143/65 (!) 122/51 (!) 142/63  Pulse: 91 78 66 79  Resp: 18 18 18 18   Temp: 97.7 F (36.5 C) 97.7 F (36.5 C) 97.7 F (36.5 C) 97.6 F (36.4 C)  TempSrc: Oral Oral Oral Oral  SpO2: 91% 96% 96% 97%  Weight:   114.4 kg (252 lb 3.3 oz)   Height:        General: Elderly female, moderately built and nourished, lying comfortably propped up in bed. Does not appear in any distress. Subsequently seen sitting on bedside commode with PT supervision. Cardiovascular: RRR, S1/S2 +, no rubs, no gallops.  Respiratory: CTA bilaterally, no wheezing, no rhonchi. No increased work of breathing.  Abdominal: Soft, NT, ND, bowel sounds +.  Extremities: Chronic arthritic changes of small joints of her hands. Small joints of her right hand are mildly warm, tender to touch with painful range of motion movements >better today. No acute findings in joints of her left hand or both feet.    The results of significant diagnostics from this hospitalization (including imaging, microbiology, ancillary and laboratory) are listed below for reference.     Labs: BNP (last 3 results) Recent Labs    06/23/17 0443  BNP 72.5   Basic Metabolic Panel: Recent Labs  Lab 06/22/17 2016 06/24/17 0254 06/25/17 0534 06/27/17 0437 06/28/17 0614  NA 139 137 136 133* 137  K 3.7 3.9 4.2 4.3 4.6  CL 104 103 104 100* 104  CO2 27 25 22 23 26   GLUCOSE 106* 114* 110* 135* 130*  BUN 13 17 20  27* 38*  CREATININE 1.00 1.11* 1.05* 1.11* 1.06*  CALCIUM 9.3 8.8* 9.0 9.3 9.5   Liver Function Tests: Recent Labs  Lab 06/24/17 0254  AST 20  ALT 12*  ALKPHOS 80  BILITOT 1.0  PROT 5.8*  ALBUMIN 3.2*   CBC: Recent Labs  Lab 06/22/17 2016 06/24/17 0254 06/25/17 0534  06/27/17 0437 06/28/17 0614  WBC 10.9* 12.2* 12.2* 17.1* 17.3*  NEUTROABS  --   --   --  14.5*  --   HGB 11.2* 11.1* 11.2* 11.7* 11.5*  HCT 35.9* 35.6* 36.2 37.8 37.2  MCV 85.5 85.4 86.4 85.9 86.1  PLT 281 265 266 276 355   CBG: Recent Labs  Lab 06/23/17 0942  GLUCAP 113*   Urinalysis    Component Value Date/Time   COLORURINE YELLOW 06/23/2017 0928   APPEARANCEUR CLEAR 06/23/2017 0928   LABSPEC 1.021 06/23/2017 0928   PHURINE 5.0 06/23/2017 0928   GLUCOSEU NEGATIVE 06/23/2017 0928   HGBUR NEGATIVE 06/23/2017 0928   HGBUR negative 01/13/2010 1056   BILIRUBINUR NEGATIVE 06/23/2017 0928   BILIRUBINUR negative 02/11/2017 1709   KETONESUR NEGATIVE 06/23/2017 0928   PROTEINUR NEGATIVE 06/23/2017 0928   UROBILINOGEN 0.2 02/11/2017 1709   UROBILINOGEN 0.2 01/13/2010 1056   NITRITE NEGATIVE 06/23/2017 0928   LEUKOCYTESUR NEGATIVE 06/23/2017 4782   Discussed with patient's daughter at bedside. Updated care and answered questions.  Time coordinating discharge: 35 minutes   Vernell Leep, MD, [place], [facility]. [facility] Pager [phone]  If 7PM-7AM, please contact night-coverage [email or web address] Password [facility] 06/28/2017, 3:04 PM

## 2017-06-28 NOTE — Care Management Important Message (Signed)
Important Message  Patient Details  Name: Chelsea Keller MRN: [id number] Date of Birth: 31-Jan-1935   Medicare Important Message Given:  Yes    Nathen May 06/28/2017, 9:16 AM

## 2017-06-28 NOTE — Progress Notes (Signed)
Clinical Social Worker facilitated patient discharge including contacting patient family and facility to confirm patient discharge plans.  Clinical information faxed to facility and family agreeable with plan.  CSW arranged ambulance transport via [facility] to [facility].  RN to call [phone] (pt will discharge to room 140) for report prior to discharge.  Clinical Social Worker will sign off for now as social work intervention is no longer needed. Please consult [place] again if new need arises.  Rhea Pink, MSW, [place]

## 2017-06-28 NOTE — Progress Notes (Signed)
Physical Therapy Treatment Patient Details Name: Chelsea Keller MRN: [id number] DOB: 16-Nov-1934 Today's Date: 06/28/2017    History of Present Illness Chelsea Keller is a 82 year old female with medical history significant for hypertension, hyperlipidemia, morbid obesity, chronic venous insufficiency and congestive heart failure presented to the emergency department complaining of 1 week of progressive shortness of breath back pain. Upon ED evaluation patient was found bilateral pulmonary embolism for which she was started on heparin drip.  Workup including lower extremity Doppler and echocardiogram with no acute abnormalities. Patient complained about back pain during hospital stay, felt to be muscle spasm,    PT Comments    Pt performed increased gait and functional mobility with decreased assistance.  Plan for SNF remains appropriate as she is deconditioned and will benefit from skilled placement to improve strength and functional mobility.  Pt sitting in recliner with needs in reach post transfer.  Pt with bout of urinary incontinence during session and requires total assistance for pericare.    Follow Up Recommendations  SNF;Supervision/Assistance - 24 hour     Equipment Recommendations  Wheelchair (measurements PT);Wheelchair cushion (measurements PT)    Recommendations for Other Services       Precautions / Restrictions Precautions Precautions: Fall Restrictions Weight Bearing Restrictions: No    Mobility  Bed Mobility               General bed mobility comments: Pt sitting in recliner on arrival   Transfers Overall transfer level: Needs assistance Equipment used: Rolling walker (2 wheeled) Transfers: Sit to/from Stand Sit to Stand: Min guard         General transfer comment: Cues for hand placement to and from seated surface.  Pt performed x2 from [facility] and x1 from recliner. slow to ascend and guarded due to pain.    Ambulation/Gait Ambulation/Gait assistance:  Min guard Ambulation Distance (Feet): 4 Feet(+ 12 ft.  ) Assistive device: Rolling walker (2 wheeled) Gait Pattern/deviations: Step-to pattern;Wide base of support;Shuffle;Decreased step length - right;Decreased step length - left   Gait velocity interpretation: Below normal speed for age/gender General Gait Details: Pt performed stand pivot from bed to chair followed by short gait trial of 12 ft.  Cues for sequencing, RW safety and assistance with turning and backing.     Stairs            Wheelchair Mobility    Modified Rankin (Stroke Patients Only)       Balance Overall balance assessment: Needs assistance   Sitting balance-Leahy Scale: Fair       Standing balance-Leahy Scale: Poor                              Cognition Arousal/Alertness: Awake/alert Behavior During Therapy: WFL for tasks assessed/performed;Anxious Overall Cognitive Status: Within Functional Limits for tasks assessed Area of Impairment: Safety/judgement                         Safety/Judgement: Decreased awareness of deficits     General Comments: Patient reports pain in both hands.  Yesterday, she reported pain in Rt hand only.  Today she states "It's always been in both hands."      Exercises      General Comments        Pertinent Vitals/Pain Pain Assessment: Faces Faces Pain Scale: Hurts little more Pain Location: Both feet; Both hands, R>L Pain Descriptors / Indicators: Aching;Sharp Pain Intervention(s): Monitored  during session;Repositioned    Home Living                      Prior Function            PT Goals (current goals can now be found in the care plan section) Acute Rehab PT Goals Patient Stated Goal: To be able to walk Potential to Achieve Goals: Good Progress towards PT goals: Progressing toward goals    Frequency           PT Plan Current plan remains appropriate    Co-evaluation              AM-PAC PT "6  Clicks" Daily Activity  Outcome Measure  Difficulty turning over in bed (including adjusting bedclothes, sheets and blankets)?: A Little Difficulty moving from lying on back to sitting on the side of the bed? : Unable Difficulty sitting down on and standing up from a chair with arms (e.g., wheelchair, bedside commode, etc,.)?: Unable Help needed moving to and from a bed to chair (including a wheelchair)?: A Little Help needed walking in hospital room?: A Little Help needed climbing 3-5 steps with a railing? : A Lot 6 Click Score: 13    End of Session Equipment Utilized During Treatment: Gait belt Activity Tolerance: Patient limited by pain Patient left: in bed;with call bell/phone within reach;with bed alarm set Nurse Communication: Mobility status PT Visit Diagnosis: Muscle weakness (generalized) (M62.81);Difficulty in walking, not elsewhere classified (R26.2);Pain Pain - part of body: Ankle and joints of foot;Hand(B feet and hands)     Time: 1411-1434 PT Time Calculation (min) (ACUTE ONLY): 23 min  Charges:  $Gait Training: 8-22 mins $Therapeutic Activity: 8-22 mins                    G Codes:       Governor Rooks, PTA pager [phone]    Cristela Blue 06/28/2017, 2:53 PM

## 2017-06-28 NOTE — Progress Notes (Signed)
Patient left hospital without hard copy of rx for oxycodone as Dr. Lavella Lemons just brought script down the hall.    SW Caryl Pina aware and instructed  Nursing to call facility for their admission coordinator to come and pick up rx. When calling facility, nursing stated that she had already obtained a hard copy of med from their house physician. Charge nurse, Butch Penny made aware. Unit secretary Collins Scotland observed this scriber shred hard copy and placed in shredder in nurses station.

## 2017-06-28 NOTE — Progress Notes (Signed)
Patient being transferred today to [facility]. Daughter here to provided support . All personal belongings with patient . Pt exhibits no distress. Report called to [facility].

## 2017-06-28 NOTE — [place] FL2 (Signed)
[place] MEDICAID FL2 LEVEL OF CARE SCREENING TOOL     IDENTIFICATION  Patient Name: Chelsea Keller Birthdate: Apr 25, 1935 Sex: female Admission Date (Current Location): 06/23/2017  [facility] and [place] Number:  Herbalist and Address:  [facility], [place] [address] [place], [place] [address]      Provider Number: [id number]  Attending Physician Name and Address:  Modena Jansky, MD  Relative Name and Phone Number:       Current Level of Care: Hospital Recommended Level of Care: [place] Prior Approval Number:    Date Approved/Denied:   PASRR Number: [id number]  Discharge Plan: SNF    Current Diagnoses: Patient Active Problem List   Diagnosis Date Noted  . Pressure injury of skin 06/24/2017  . Bilateral pulmonary embolism ([place]) 06/23/2017  . Right sided abdominal pain 06/01/2017  . Pain 04/04/2017  . Acute kidney injury ([place]) 04/03/2017  . Intractable pain-left hip 04/03/2017  . Fibromyalgia 04/03/2017  . Anemia 04/03/2017  . Chronic diastolic CHF (congestive heart failure) ([place]) 04/03/2017  . Hypertension 04/03/2017  . Hypertriglyceridemia 04/03/2017  . Vitamin B12 deficiency 04/03/2017  . CKD (chronic kidney disease) stage 3, GFR 30-59 ml/min (HCC) 04/03/2017  . Acute hypokalemia 04/03/2017  . Left sided sciatica   . LLQ pain 02/11/2017  . Advance directive discussed with patient 01/19/2017  . Chronic diastolic congestive heart failure ([place]) 05/09/2014  . Mood disorder ([place]) 11/03/2013  . Psoriasis 02/21/2013  . Routine general medical examination at a health care facility 06/14/2012  . BMI 50.0-59.9, adult ([place]) 12/02/2011  . VENTRICULAR HYPERTROPHY, LEFT 10/19/2008  . Sleep disturbance 10/25/2007  . Venous (peripheral) insufficiency 08/11/2007  . Osteoarthritis, multiple sites 08/11/2007  . Impaired fasting glucose 08/11/2007  . COLONIC POLYPS, HX OF 05/20/2007  . B12 DEFICIENCY 09/30/2006  .  Essential hypertension 09/30/2006  . Allergic rhinitis due to pollen 09/30/2006    Orientation RESPIRATION BLADDER Height & Weight     Self, Time, Situation, Place  Normal Incontinent, External catheter Weight: 252 lb 3.3 oz (114.4 kg) Height:  5\' 2"  (157.5 cm)  BEHAVIORAL SYMPTOMS/MOOD NEUROLOGICAL BOWEL NUTRITION STATUS      Continent Diet(cardiac)  AMBULATORY STATUS COMMUNICATION OF NEEDS Skin   Extensive Assist Verbally PU Stage and Appropriate Care   PU Stage 2 Dressing: No Dressing(sacrum)                   Personal Care Assistance Level of Assistance  Bathing, Dressing Bathing Assistance: Maximum assistance   Dressing Assistance: Maximum assistance     Functional Limitations Info             SPECIAL CARE FACTORS FREQUENCY  PT (By licensed PT), OT (By licensed OT)     PT Frequency: 5/wk OT Frequency: 5/wk            Contractures      Additional Factors Info  Code Status, Allergies Code Status Info: FULL Allergies Info: Codeine Sulfate, Celecoxib, Erythromycin Base           Current Medications (06/28/2017):  This is the current hospital active medication list Current Facility-Administered Medications  Medication Dose Route Frequency Provider Last Rate Last Dose  . acetaminophen (TYLENOL) tablet 650 mg  650 mg Oral Q6H PRN Rondel Jumbo, PA-C   650 mg at 06/24/17 1503   Or  . acetaminophen (TYLENOL) suppository 650 mg  650 mg Rectal Q6H PRN Rondel Jumbo, PA-C      .  apixaban (ELIQUIS) tablet 10 mg  10 mg Oral BID Skeet Simmer, RPH   10 mg at 06/28/17 3016   Followed by  . [START ON 07/02/2017] apixaban (ELIQUIS) tablet 5 mg  5 mg Oral BID Skeet Simmer, [facility]      . bisacodyl (DULCOLAX) suppository 10 mg  10 mg Rectal Daily PRN Sharene Butters E, PA-C      . lidocaine (LIDODERM) 5 % 1 patch  1 patch Transdermal Q24H Gardiner Barefoot, NP   1 patch at 06/24/17 [id number]  . methocarbamol (ROBAXIN) tablet 750 mg  750 mg Oral Q6H PRN Patrecia Pour, Christean Grief, MD   750 mg at 06/27/17 2305  . ondansetron (ZOFRAN) tablet 4 mg  4 mg Oral Q6H PRN Rondel Jumbo, PA-C       Or  . ondansetron [facility]) injection 4 mg  4 mg Intravenous Q6H PRN Sharene Butters E, PA-C      . oxyCODONE (Oxy IR/ROXICODONE) immediate release tablet 5 mg  5 mg Oral Q4H PRN Patrecia Pour, Christean Grief, MD   5 mg at 06/28/17 0911  . potassium chloride SA (K-DUR,KLOR-CON) CR tablet 40 mEq  40 mEq Oral Daily Rondel Jumbo, PA-C   40 mEq at 06/28/17 0843  . predniSONE (DELTASONE) tablet 20 mg  20 mg Oral Once Vernell Leep D, MD      . senna-docusate (Senokot-S) tablet 1 tablet  1 tablet Oral QHS PRN Rondel Jumbo, PA-C   1 tablet at 06/27/17 1540  . triamterene-hydrochlorothiazide (MAXZIDE-25) 37.5-25 MG per tablet 1 tablet  1 tablet Oral Daily Rondel Jumbo, PA-C   1 tablet at 06/28/17 3235     Discharge Medications: Please see discharge summary for a list of discharge medications.  Relevant Imaging Results:  Relevant Lab Results:   Additional Information SS#: [id number]  Jorge Ny, LCSW

## 2017-06-28 NOTE — Progress Notes (Signed)
PROGRESS NOTE [facility]   Charlita Brian   EXH:[id number] DOB: 06-17-1935  DOA: 06/23/2017 PCP: Venia Carbon, MD   Brief Narrative:  Chelsea Keller is a 82 year old female with medical history significant for hypertension, hyperlipidemia, morbid obesity, chronic venous insufficiency and congestive heart failure presented to the emergency department complaining of 1 week of progressive shortness of breath back pain.  Upon ED evaluation patient was found bilateral pulmonary embolism for which she was started on heparin drip. Workup including lower extremity Doppler and echocardiogram has been negative. Patient was switched to Eliquis. Upon PT evaluation patient reported unable to walk d/t pain and was recommended SNF for rehab   Subjective: Patient reports pain in her hands and feet are much better. Reports history of arthritis in the past for which she had seen rheumatology and states that she was told she has "100 types of arthritis". She briefly was on gold therapy but developed skin rash and had to see dermatology. She was on oral prednisone, undetermined dose for almost a year several years ago.  Assessment & Plan: Pulmonary embolism bilaterally Unprovoked, no hx of recent travel, no recent hospitalization, no cancer. Patient is very sedentary due to obesity   Briefly on IV heparin infusion and then transitioned to oral anticoagulation. On Eliquis  Lower extremity ultrasound negative for DVT Echocardiogram shows no heart strain Stable.  Hypertension BP stable Continue home medication  Chronic back pain, muscle spasm, fibromyalgia, severe osteoarthritis  - PT recommending SNF  - Patient denies history of peripheral neuropathy or diabetes. Remotely has had gout but current pain not similar to same. - X-rays off right hand and left foot consistent with osteoarthritis. - Current worsening pain may be related to osteoarthritis flare. Feels somewhat better after dose of IM  Decadron on 12/22. Patient reluctant to use any medications except Tylenol.  - Reports nausea with NSAIDs. Has used tramadol remotely. - Since pain is not neuropathic, DC gabapentin.   - Continue when necessary Tylenol for pain and will give a dose of prednisone 20 mg and monitor response. - Seems to have good response to prednisone. Repeat prednisone dose today. Recommend rheumatology consultation and follow-up as outpatient. Leukocytosis is likely related to steroids.  Chronic kidney disease stage III Creatinine normal/1.06 today.  Morbid obesity Follow-up as an outpatient  Sacral pressure ulcer stage II POA  Wound care per nursing   Anemia - Stable. Follow CBCs periodically.  DVT prophylaxis: Heparin drip> Eliquis Code Status: Full code Family Communication: None at bedside today. Disposition Plan: SNF when bed available . Social worker input pending. I personally discussed with case management regarding discharge disposition. Also requested RN to ambulate patient now that her pain is better.   Consultants:   None  Procedures:   Echocardiogram  Antimicrobials:  None   Objective: Vitals:   06/27/17 0525 06/27/17 1100 06/27/17 2034 06/28/17 0554  BP: 121/68 (!) 144/60 (!) 143/65 (!) 122/51  Pulse: 70 91 78 66  Resp: 18 18 18 18   Temp: 97.6 F (36.4 C) 97.7 F (36.5 C) 97.7 F (36.5 C) 97.7 F (36.5 C)  TempSrc: Oral Oral Oral Oral  SpO2: 100% 91% 96% 96%  Weight: 117.6 kg (259 lb 3.2 oz)   114.4 kg (252 lb 3.3 oz)  Height:        Intake/Output Summary (Last 24 hours) at 06/28/2017 1316 Last data filed at 06/28/2017 0934 Gross per 24 hour  Intake 630 ml  Output 1000 ml  Net -370 ml  Filed Weights   06/26/17 0426 06/27/17 0525 06/28/17 0554  Weight: 120.3 kg (265 lb 3.4 oz) 117.6 kg (259 lb 3.2 oz) 114.4 kg (252 lb 3.3 oz)    Examination:  General: Elderly female, moderately built and nourished, lying comfortably propped up in bed. Does not appear  in any distress. Cardiovascular: RRR, S1/S2 +, no rubs, no gallops. Stable without change. Respiratory: CTA bilaterally, no wheezing, no rhonchi. No increased work of breathing. Stable without change. Abdominal: Soft, NT, ND, bowel sounds +. Stable without change. Extremities: Chronic arthritic changes of small joints of her hands. Small joints of her right hand are mildly warm, tender to touch with painful range of motion movements >better today. No acute findings in joints of her left hand or both feet.  Data Reviewed: I have personally reviewed following labs and imaging studies  CBC: Recent Labs  Lab 06/22/17 2016 06/24/17 0254 06/25/17 0534 06/27/17 0437 06/28/17 0614  WBC 10.9* 12.2* 12.2* 17.1* 17.3*  NEUTROABS  --   --   --  14.5*  --   HGB 11.2* 11.1* 11.2* 11.7* 11.5*  HCT 35.9* 35.6* 36.2 37.8 37.2  MCV 85.5 85.4 86.4 85.9 86.1  PLT 281 265 266 276 035   Basic Metabolic Panel: Recent Labs  Lab 06/22/17 2016 06/24/17 0254 06/25/17 0534 06/27/17 0437 06/28/17 0614  NA 139 137 136 133* 137  K 3.7 3.9 4.2 4.3 4.6  CL 104 103 104 100* 104  CO2 27 25 22 23 26   GLUCOSE 106* 114* 110* 135* 130*  BUN 13 17 20  27* 38*  CREATININE 1.00 1.11* 1.05* 1.11* 1.06*  CALCIUM 9.3 8.8* 9.0 9.3 9.5   GFR: Estimated Creatinine Clearance: 50.7 mL/min (A) (by C-G formula based on SCr of 1.06 mg/dL (H)). Liver Function Tests: Recent Labs  Lab 06/24/17 0254  AST 20  ALT 12*  ALKPHOS 80  BILITOT 1.0  PROT 5.8*  ALBUMIN 3.2*   Coagulation Profile: Recent Labs  Lab 06/23/17 1020  INR 1.35    CBG: Recent Labs  Lab 06/23/17 0942  GLUCAP 113*     Radiology Studies: No results found.    Scheduled Meds: . apixaban  10 mg Oral BID   Followed by  . [START ON 07/02/2017] apixaban  5 mg Oral BID  . lidocaine  1 patch Transdermal Q24H  . potassium chloride SA  40 mEq Oral Daily  . triamterene-hydrochlorothiazide  1 tablet Oral Daily   Continuous Infusions:     LOS: 4 days   Vernell Leep, MD, FACP, [facility]. [facility] Pager [phone]  If 7PM-7AM, please contact night-coverage [email or web address] Password [email or web address] 06/28/2017, 1:16 PM

## 2017-07-01 DIAGNOSIS — R2689 Other abnormalities of gait and mobility: Secondary | ICD-10-CM | POA: Diagnosis not present

## 2017-07-01 DIAGNOSIS — M797 Fibromyalgia: Secondary | ICD-10-CM | POA: Diagnosis not present

## 2017-07-01 DIAGNOSIS — R278 Other lack of coordination: Secondary | ICD-10-CM | POA: Diagnosis not present

## 2017-07-01 DIAGNOSIS — I509 Heart failure, unspecified: Secondary | ICD-10-CM | POA: Diagnosis not present

## 2017-07-01 DIAGNOSIS — M545 Low back pain: Secondary | ICD-10-CM | POA: Diagnosis not present

## 2017-07-01 DIAGNOSIS — I872 Venous insufficiency (chronic) (peripheral): Secondary | ICD-10-CM | POA: Diagnosis not present

## 2017-07-01 DIAGNOSIS — M6281 Muscle weakness (generalized): Secondary | ICD-10-CM | POA: Diagnosis not present

## 2017-07-01 DIAGNOSIS — Z86711 Personal history of pulmonary embolism: Secondary | ICD-10-CM | POA: Diagnosis not present

## 2017-07-01 DIAGNOSIS — Z5189 Encounter for other specified aftercare: Secondary | ICD-10-CM | POA: Diagnosis not present

## 2017-07-01 DIAGNOSIS — M549 Dorsalgia, unspecified: Secondary | ICD-10-CM | POA: Diagnosis not present

## 2017-07-01 DIAGNOSIS — N183 Chronic kidney disease, stage 3 (moderate): Secondary | ICD-10-CM | POA: Diagnosis not present

## 2017-07-01 DIAGNOSIS — L259 Unspecified contact dermatitis, unspecified cause: Secondary | ICD-10-CM | POA: Diagnosis not present

## 2017-07-01 DIAGNOSIS — G894 Chronic pain syndrome: Secondary | ICD-10-CM | POA: Diagnosis not present

## 2017-07-01 DIAGNOSIS — N189 Chronic kidney disease, unspecified: Secondary | ICD-10-CM | POA: Diagnosis not present

## 2017-07-01 DIAGNOSIS — M62838 Other muscle spasm: Secondary | ICD-10-CM | POA: Diagnosis not present

## 2017-07-01 DIAGNOSIS — I2699 Other pulmonary embolism without acute cor pulmonale: Secondary | ICD-10-CM | POA: Diagnosis not present

## 2017-07-01 DIAGNOSIS — I10 Essential (primary) hypertension: Secondary | ICD-10-CM | POA: Diagnosis not present

## 2017-07-01 DIAGNOSIS — E785 Hyperlipidemia, unspecified: Secondary | ICD-10-CM | POA: Diagnosis not present

## 2017-07-01 NOTE — Telephone Encounter (Signed)
Thanks

## 2017-07-02 DIAGNOSIS — I10 Essential (primary) hypertension: Secondary | ICD-10-CM | POA: Diagnosis not present

## 2017-07-02 DIAGNOSIS — I2699 Other pulmonary embolism without acute cor pulmonale: Secondary | ICD-10-CM | POA: Diagnosis not present

## 2017-07-02 DIAGNOSIS — M549 Dorsalgia, unspecified: Secondary | ICD-10-CM | POA: Diagnosis not present

## 2017-07-02 DIAGNOSIS — I509 Heart failure, unspecified: Secondary | ICD-10-CM | POA: Diagnosis not present

## 2017-07-05 DIAGNOSIS — R2689 Other abnormalities of gait and mobility: Secondary | ICD-10-CM | POA: Diagnosis not present

## 2017-07-05 DIAGNOSIS — L259 Unspecified contact dermatitis, unspecified cause: Secondary | ICD-10-CM | POA: Diagnosis not present

## 2017-07-05 DIAGNOSIS — G894 Chronic pain syndrome: Secondary | ICD-10-CM | POA: Diagnosis not present

## 2017-07-07 ENCOUNTER — Telehealth: Payer: Self-pay | Admitting: Cardiovascular Disease

## 2017-07-07 DIAGNOSIS — R2689 Other abnormalities of gait and mobility: Secondary | ICD-10-CM | POA: Diagnosis not present

## 2017-07-07 DIAGNOSIS — G894 Chronic pain syndrome: Secondary | ICD-10-CM | POA: Diagnosis not present

## 2017-07-07 NOTE — Telephone Encounter (Signed)
I spoke with pt's daughter. Pt was recently discharged from hospital and is now at [facility] due to inability to ambulate.  Daughter reports pt is starting to have swelling in feet again.  She is being followed by doctor at facility and daughter has requested to have swelling evaluated at facility. She is asking if Dr. Angelena Form could consult on this.  It is difficult for pt to come into our office at this time for office visit. I told daughter to follow instructions from MD at facility and made her aware MD at [facility] could contact Dr. Angelena Form if he/she had questions.

## 2017-07-07 NOTE — Telephone Encounter (Signed)
New Message  Pt call requesting to speak with RN to f/u on pts health no that she is in rehab. Please call back to discuss

## 2017-07-08 DIAGNOSIS — R2689 Other abnormalities of gait and mobility: Secondary | ICD-10-CM | POA: Diagnosis not present

## 2017-07-08 DIAGNOSIS — G894 Chronic pain syndrome: Secondary | ICD-10-CM | POA: Diagnosis not present

## 2017-07-12 DIAGNOSIS — R2689 Other abnormalities of gait and mobility: Secondary | ICD-10-CM | POA: Diagnosis not present

## 2017-07-12 DIAGNOSIS — G894 Chronic pain syndrome: Secondary | ICD-10-CM | POA: Diagnosis not present

## 2017-07-14 DIAGNOSIS — R2689 Other abnormalities of gait and mobility: Secondary | ICD-10-CM | POA: Diagnosis not present

## 2017-07-14 DIAGNOSIS — G894 Chronic pain syndrome: Secondary | ICD-10-CM | POA: Diagnosis not present

## 2017-07-16 DIAGNOSIS — R2689 Other abnormalities of gait and mobility: Secondary | ICD-10-CM | POA: Diagnosis not present

## 2017-07-16 DIAGNOSIS — G894 Chronic pain syndrome: Secondary | ICD-10-CM | POA: Diagnosis not present

## 2017-07-20 DIAGNOSIS — R2689 Other abnormalities of gait and mobility: Secondary | ICD-10-CM | POA: Diagnosis not present

## 2017-07-20 DIAGNOSIS — I509 Heart failure, unspecified: Secondary | ICD-10-CM | POA: Diagnosis not present

## 2017-07-20 DIAGNOSIS — G894 Chronic pain syndrome: Secondary | ICD-10-CM | POA: Diagnosis not present

## 2017-07-20 DIAGNOSIS — I2699 Other pulmonary embolism without acute cor pulmonale: Secondary | ICD-10-CM | POA: Diagnosis not present

## 2017-07-27 ENCOUNTER — Telehealth: Payer: Self-pay | Admitting: Internal Medicine

## 2017-07-27 DIAGNOSIS — R32 Unspecified urinary incontinence: Secondary | ICD-10-CM | POA: Diagnosis not present

## 2017-07-27 DIAGNOSIS — N183 Chronic kidney disease, stage 3 (moderate): Secondary | ICD-10-CM | POA: Diagnosis not present

## 2017-07-27 DIAGNOSIS — M1990 Unspecified osteoarthritis, unspecified site: Secondary | ICD-10-CM | POA: Diagnosis not present

## 2017-07-27 DIAGNOSIS — Z9181 History of falling: Secondary | ICD-10-CM | POA: Diagnosis not present

## 2017-07-27 DIAGNOSIS — I872 Venous insufficiency (chronic) (peripheral): Secondary | ICD-10-CM | POA: Diagnosis not present

## 2017-07-27 DIAGNOSIS — G8929 Other chronic pain: Secondary | ICD-10-CM | POA: Diagnosis not present

## 2017-07-27 DIAGNOSIS — E785 Hyperlipidemia, unspecified: Secondary | ICD-10-CM | POA: Diagnosis not present

## 2017-07-27 DIAGNOSIS — Z86711 Personal history of pulmonary embolism: Secondary | ICD-10-CM | POA: Diagnosis not present

## 2017-07-27 DIAGNOSIS — I130 Hypertensive heart and chronic kidney disease with heart failure and stage 1 through stage 4 chronic kidney disease, or unspecified chronic kidney disease: Secondary | ICD-10-CM | POA: Diagnosis not present

## 2017-07-27 DIAGNOSIS — M5430 Sciatica, unspecified side: Secondary | ICD-10-CM | POA: Diagnosis not present

## 2017-07-27 DIAGNOSIS — M797 Fibromyalgia: Secondary | ICD-10-CM | POA: Diagnosis not present

## 2017-07-27 DIAGNOSIS — D631 Anemia in chronic kidney disease: Secondary | ICD-10-CM | POA: Diagnosis not present

## 2017-07-27 DIAGNOSIS — I509 Heart failure, unspecified: Secondary | ICD-10-CM | POA: Diagnosis not present

## 2017-07-27 DIAGNOSIS — M549 Dorsalgia, unspecified: Secondary | ICD-10-CM | POA: Diagnosis not present

## 2017-07-27 DIAGNOSIS — Z7901 Long term (current) use of anticoagulants: Secondary | ICD-10-CM | POA: Diagnosis not present

## 2017-07-27 DIAGNOSIS — M6281 Muscle weakness (generalized): Secondary | ICD-10-CM | POA: Diagnosis not present

## 2017-07-27 NOTE — Telephone Encounter (Signed)
Copied from [place]. Topic: Quick Communication - See Telephone Encounter >> Jul 27, 2017  2:12 PM Burnis Medin, NT wrote: CRM for notification. See Telephone encounter for: Becky Sax is calling to see if she can get verbal orders for plan of care for patient 2 times a week for 2 weeks and 1 time a week for 4 weeks and every other week for 3 weeks. Also a plan of care for PT for 2 times a week for 5 weeks. She would like a call back [phone]  07/27/17.

## 2017-07-28 ENCOUNTER — Encounter: Payer: Self-pay | Admitting: Internal Medicine

## 2017-07-28 ENCOUNTER — Telehealth: Payer: Self-pay | Admitting: Internal Medicine

## 2017-07-28 ENCOUNTER — Ambulatory Visit (INDEPENDENT_AMBULATORY_CARE_PROVIDER_SITE_OTHER): Payer: Medicare Other | Admitting: Internal Medicine

## 2017-07-28 VITALS — HR 78 | Temp 97.4°F | Wt 267.0 lb

## 2017-07-28 DIAGNOSIS — I2699 Other pulmonary embolism without acute cor pulmonale: Secondary | ICD-10-CM | POA: Diagnosis not present

## 2017-07-28 DIAGNOSIS — N183 Chronic kidney disease, stage 3 unspecified: Secondary | ICD-10-CM

## 2017-07-28 DIAGNOSIS — Z6841 Body Mass Index (BMI) 40.0 and over, adult: Secondary | ICD-10-CM | POA: Diagnosis not present

## 2017-07-28 DIAGNOSIS — I5032 Chronic diastolic (congestive) heart failure: Secondary | ICD-10-CM | POA: Diagnosis not present

## 2017-07-28 DIAGNOSIS — F39 Unspecified mood [affective] disorder: Secondary | ICD-10-CM | POA: Diagnosis not present

## 2017-07-28 DIAGNOSIS — L280 Lichen simplex chronicus: Secondary | ICD-10-CM | POA: Diagnosis not present

## 2017-07-28 MED ORDER — FUROSEMIDE 40 MG PO TABS: 40.0000 mg | ORAL_TABLET | ORAL | 0 refills | Status: DC

## 2017-07-28 NOTE — Telephone Encounter (Signed)
That is okay.

## 2017-07-28 NOTE — Assessment & Plan Note (Signed)
Rx for TAC

## 2017-07-28 NOTE — Assessment & Plan Note (Signed)
Stable status Creatinine better in hospital this past time

## 2017-07-28 NOTE — Assessment & Plan Note (Signed)
Stress with hospital and rehab Ongoing foot pain affects it subsyndromal--will hold off on meds

## 2017-07-28 NOTE — Progress Notes (Signed)
Subjective:    Patient ID: Chelsea Keller, female    DOB: [birth year], 83 y.o.   MRN: [id number]  HPI Here for hospital follow up and then rehab at [facility] to ER due to trouble breathing Some pain in feet--but seemed to be later ER evaluation showed bilateral pulmonary emboli No DVT and echo okay Heparin then eliquis Trouble walking around couldn't walk due to foot pain Readmitted--then transferred to [facility] for rehab  Still having severe foot pain Left ankle is the worst--thinks she is ready to have the ankle fused (Dr Marcelino Scot)  Breathing is fine now No chest pain Having trouble with edema---39m furosemide not effective but the 419mwas too much Tried support hose--didn't tolerate No palpitations Some dizziness but no syncope. Not as bad now--[facility] to [facility] room Walking with rollator Has home health nurse and PT at home Independent with ADLs. Helps with some of the housework  Rash on back in hospital Very itchy--using back scratcher  Happy to be home Missed puppy Ongoing nerves----"shot"  Current Outpatient Medications on File Prior to Visit  Medication Sig Dispense Refill  . acetaminophen (TYLENOL) 325 MG tablet Take 2 tablets (650 mg total) by mouth every 6 (six) hours as needed for mild pain.    . Marland Kitchenpixaban (ELIQUIS) 5 MG TABS tablet Take 5 mg by mouth 2 (two) times daily.    . furosemide (LASIX) 20 MG tablet Take 20 mg by mouth every other day.    . gabapentin (NEURONTIN) 100 MG capsule Take 100 mg by mouth 2 (two) times daily.    . potassium chloride SA (K-DUR,KLOR-CON) 20 MEQ tablet TAKE 2 TABLETS (40 MEQ TOTAL) DAILY 180 tablet 3  . triamterene-hydrochlorothiazide (MAXZIDE-25) 37.5-25 MG tablet Take 1 tablet by mouth daily. 90 tablet 3   No current facility-administered medications on file prior to visit.     Allergies  Allergen Reactions  . Codeine Sulfate  Shortness Of Breath  . Celecoxib Other (See Comments)    Pt bled out  . Erythromycin Base Nausea And Vomiting    Past Medical History:  Diagnosis Date  . Allergy   . Anemia   . Anxiety   . Arthritis   . Chronic diastolic CHF (congestive heart failure) (HCC)    a. Echo 1/16:  mild LVH, EF normal, grade 1 DD, MAC  . Chronic venous insufficiency    chronic LE edema  . Depression   . Fibromyalgia    constant pain  . Hx of cardiac catheterization    [place] with mild plaque in a single vessel - records not available  . Hx of cardiovascular stress test    a. Nuclear study in 2008 normal  . Hx of colonic polyps   . Hypertension   . Hypertriglyceridemia   . Impaired fasting glucose   . PONV (postoperative nausea and vomiting)   . PUD (peptic ulcer disease)    hx of gastric ulcer  . Pulmonary emboli ([place]  . Vitamin B12 deficiency     Past Surgical History:  Procedure Laterality Date  . ABDOMINAL HYSTERECTOMY    . CATARACT EXTRACTION  10/2003   OD  . CHOLECYSTECTOMY    . COLONOSCOPY W/ POLYPECTOMY    . COLONOSCOPY WITH PROPOFOL N/A 10/15/2014   Procedure: COLONOSCOPY WITH PROPOFOL;  Surgeon: MaLadene ArtistMD;  Location: [facility];  Service: Endoscopy;  Laterality: N/A;  . ESOPHAGOGASTRODUODENOSCOPY (EGD) WITH PROPOFOL N/A 10/15/2014   Procedure: ESOPHAGOGASTRODUODENOSCOPY (EGD) WITH PROPOFOL;  Surgeon: Ladene Artist, MD;  Location: [facility];  Service: Endoscopy;  Laterality: N/A;  . EXTERNAL FIXATION ANKLE FRACTURE     Fx.  left ankle-fixation with pins later removed sec to infection 1985  . EXTERNAL FIXATION WRIST FRACTURE  1985   left with pins  . EYE SURGERY    . FEMUR FRACTURE SURGERY  06/2009  . FRACTURE SURGERY    . HARDWARE REMOVAL Left 10/05/2013   Procedure: HARDWARE REMOVAL LEFT DISTAL FEMUR;  Surgeon: Rozanna Box, MD;  Location: [facility];  Service: Orthopedics;  Laterality: Left;  . HEMIARTHROPLASTY SHOULDER FRACTURE   06/2009  . JOINT REPLACEMENT    . STERIOD INJECTION Right 10/05/2013   Procedure: STEROID INJECTION;  Surgeon: Rozanna Box, MD;  Location: [facility];  Service: Orthopedics;  Laterality: Right;  . TONSILLECTOMY    . TONSILLECTOMY    . TOTAL KNEE ARTHROPLASTY  03/09   left    Family History  Problem Relation Age of Onset  . Depression Mother   . Cancer Mother        uterine cancer  . Heart attack Father   . Cancer Brother        prostate cancer  . Diabetes Maternal Aunt   . Arthritis Brother   . Asthma Brother   . Stroke Maternal Grandmother   . Pulmonary embolism Daughter     Social History   Socioeconomic History  . Marital status: Widowed    Spouse name: Not on file  . Number of children: 4  . Years of education: Not on file  . Highest education level: Not on file  Social Needs  . Financial resource strain: Not on file  . Food insecurity - worry: Not on file  . Food insecurity - inability: Not on file  . Transportation needs - medical: Not on file  . Transportation needs - non-medical: Not on file  Occupational History  . Occupation: retired crossing guard  . 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 Smoking status: Former Smoker    Packs/day: 1.00    Years: 40.00    Pack years: 40.00    Types: Cigarettes    Last attempt to quit: 07/06/1993    Years since quitting: 24.0  . Smokeless tobacco: Never Used  Substance and Sexual Activity  . Alcohol use: No    Alcohol/week: 0.0 oz  . Drug use: No  . Sexual activity: Not on file  Other Topics Concern  . Not on file  Social History Narrative   No living will   Plans to do health care POA forms---wants daughter [name]   Would accept resuscitation attempts---no prolonged ventilation   Absolutely no feeding tube   Review of Systems  Sleeping in bed with 2 pillows--no PND Appetite is okay Bowels are fine     Objective:   Physical Exam  Constitutional: No distress.  Neck: No thyromegaly  present.  Cardiovascular: Normal rate, regular rhythm and normal heart sounds. Exam reveals no gallop.  No murmur heard. Pulmonary/Chest: Effort normal and breath sounds normal. No respiratory distress. She has no wheezes. She has no rales.  Musculoskeletal:  1+ edema in ankles and calves Feet thick  Lymphadenopathy:    She has no cervical adenopathy.  Skin:  Neuroderm on back, upper buttock, near breast  Assessment & Plan:

## 2017-07-28 NOTE — Assessment & Plan Note (Signed)
Reviewed healthy eating

## 2017-07-28 NOTE — Telephone Encounter (Signed)
Copied from [place] [id number]. Topic: Quick Communication - See Telephone Encounter >> Jul 28, 2017  9:21 AM Ahmed Prima L wrote: CRM for notification. See Telephone encounter for:   07/28/17.  Esther from [facility], (occupation therapy) said she called her & the patient told her she was independent with everything, she declined the OT eval

## 2017-07-28 NOTE — Assessment & Plan Note (Signed)
Unprovoked Remains high risk Will plan indefinite eliquis

## 2017-07-28 NOTE — Patient Instructions (Signed)
DASH Eating Plan DASH stands for "Dietary Approaches to Stop Hypertension." The DASH eating plan is a healthy eating plan that has been shown to reduce high blood pressure (hypertension). It may also reduce your risk for type 2 diabetes, heart disease, and stroke. The DASH eating plan may also help with weight loss. What are tips for following this plan? General guidelines  Avoid eating more than 2,300 mg (milligrams) of salt (sodium) a day. If you have hypertension, you may need to reduce your sodium intake to 1,500 mg a day.  Limit alcohol intake to no more than 1 drink a day for nonpregnant women and 2 drinks a day for men. One drink equals 12 oz of beer, 5 oz of wine, or 1 oz of hard liquor.  Work with your health care provider to maintain a healthy body weight or to lose weight. Ask what an ideal weight is for you.  Get at least 30 minutes of exercise that causes your heart to beat faster (aerobic exercise) most days of the week. Activities may include walking, swimming, or biking.  Work with your health care provider or diet and nutrition specialist (dietitian) to adjust your eating plan to your individual calorie needs. Reading food labels  Check food labels for the amount of sodium per serving. Choose foods with less than 5 percent of the Daily Value of sodium. Generally, foods with less than 300 mg of sodium per serving fit into this eating plan.  To find whole grains, look for the word "whole" as the first word in the ingredient list. Shopping  Buy products labeled as "low-sodium" or "no salt added."  Buy fresh foods. Avoid canned foods and premade or frozen meals. Cooking  Avoid adding salt when cooking. Use salt-free seasonings or herbs instead of table salt or sea salt. Check with your health care provider or pharmacist before using salt substitutes.  Do not fry foods. Cook foods using healthy methods such as baking, boiling, grilling, and broiling instead.  Cook with  heart-healthy oils, such as olive, canola, soybean, or sunflower oil. Meal planning   Eat a balanced diet that includes: ? 5 or more servings of fruits and vegetables each day. At each meal, try to fill half of your plate with fruits and vegetables. ? Up to 6-8 servings of whole grains each day. ? Less than 6 oz of lean meat, poultry, or fish each day. A 3-oz serving of meat is about the same size as a deck of cards. One egg equals 1 oz. ? 2 servings of low-fat dairy each day. ? A serving of nuts, seeds, or beans 5 times each week. ? Heart-healthy fats. Healthy fats called Omega-3 fatty acids are found in foods such as flaxseeds and coldwater fish, like sardines, salmon, and mackerel.  Limit how much you eat of the following: ? Canned or prepackaged foods. ? Food that is high in trans fat, such as fried foods. ? Food that is high in saturated fat, such as fatty meat. ? Sweets, desserts, sugary drinks, and other foods with added sugar. ? Full-fat dairy products.  Do not salt foods before eating.  Try to eat at least 2 vegetarian meals each week.  Eat more home-cooked food and less restaurant, buffet, and fast food.  When eating at a restaurant, ask that your food be prepared with less salt or no salt, if possible. What foods are recommended? The items listed may not be a complete list. Talk with your dietitian about what   dietary choices are best for you. Grains Whole-grain or whole-wheat bread. Whole-grain or whole-wheat pasta. Brown rice. Oatmeal. Quinoa. Bulgur. Whole-grain and low-sodium cereals. Pita bread. Low-fat, low-sodium crackers. Whole-wheat flour tortillas. Vegetables Fresh or frozen vegetables (raw, steamed, roasted, or grilled). Low-sodium or reduced-sodium tomato and vegetable juice. Low-sodium or reduced-sodium tomato sauce and tomato paste. Low-sodium or reduced-sodium canned vegetables. Fruits All fresh, dried, or frozen fruit. Canned fruit in natural juice (without  added sugar). Meat and other protein foods Skinless chicken or turkey. Ground chicken or turkey. Pork with fat trimmed off. Fish and seafood. Egg whites. Dried beans, peas, or lentils. Unsalted nuts, nut butters, and seeds. Unsalted canned beans. Lean cuts of beef with fat trimmed off. Low-sodium, lean deli meat. Dairy Low-fat (1%) or fat-free (skim) milk. Fat-free, low-fat, or reduced-fat cheeses. Nonfat, low-sodium ricotta or cottage cheese. Low-fat or nonfat yogurt. Low-fat, low-sodium cheese. Fats and oils Soft margarine without trans fats. Vegetable oil. Low-fat, reduced-fat, or light mayonnaise and salad dressings (reduced-sodium). Canola, safflower, olive, soybean, and sunflower oils. Avocado. Seasoning and other foods Herbs. Spices. Seasoning mixes without salt. Unsalted popcorn and pretzels. Fat-free sweets. What foods are not recommended? The items listed may not be a complete list. Talk with your dietitian about what dietary choices are best for you. Grains Baked goods made with fat, such as croissants, muffins, or some breads. Dry pasta or rice meal packs. Vegetables Creamed or fried vegetables. Vegetables in a cheese sauce. Regular canned vegetables (not low-sodium or reduced-sodium). Regular canned tomato sauce and paste (not low-sodium or reduced-sodium). Regular tomato and vegetable juice (not low-sodium or reduced-sodium). Pickles. Olives. Fruits Canned fruit in a light or heavy syrup. Fried fruit. Fruit in cream or butter sauce. Meat and other protein foods Fatty cuts of meat. Ribs. Fried meat. Bacon. Sausage. Bologna and other processed lunch meats. Salami. Fatback. Hotdogs. Bratwurst. Salted nuts and seeds. Canned beans with added salt. Canned or smoked fish. Whole eggs or egg yolks. Chicken or turkey with skin. Dairy Whole or 2% milk, cream, and half-and-half. Whole or full-fat cream cheese. Whole-fat or sweetened yogurt. Full-fat cheese. Nondairy creamers. Whipped toppings.  Processed cheese and cheese spreads. Fats and oils Butter. Stick margarine. Lard. Shortening. Ghee. Bacon fat. Tropical oils, such as coconut, palm kernel, or palm oil. Seasoning and other foods Salted popcorn and pretzels. Onion salt, garlic salt, seasoned salt, table salt, and sea salt. Worcestershire sauce. Tartar sauce. Barbecue sauce. Teriyaki sauce. Soy sauce, including reduced-sodium. Steak sauce. Canned and packaged gravies. Fish sauce. Oyster sauce. Cocktail sauce. Horseradish that you find on the shelf. Ketchup. Mustard. Meat flavorings and tenderizers. Bouillon cubes. Hot sauce and Tabasco sauce. Premade or packaged marinades. Premade or packaged taco seasonings. Relishes. Regular salad dressings. Where to find more information:  National Heart, Lung, and Blood Institute: www.nhlbi.nih.gov  American Heart Association: www.heart.org Summary  The DASH eating plan is a healthy eating plan that has been shown to reduce high blood pressure (hypertension). It may also reduce your risk for type 2 diabetes, heart disease, and stroke.  With the DASH eating plan, you should limit salt (sodium) intake to 2,300 mg a day. If you have hypertension, you may need to reduce your sodium intake to 1,500 mg a day.  When on the DASH eating plan, aim to eat more fresh fruits and vegetables, whole grains, lean proteins, low-fat dairy, and heart-healthy fats.  Work with your health care provider or diet and nutrition specialist (dietitian) to adjust your eating plan to your individual   calorie needs. This information is not intended to replace advice given to you by your health care provider. Make sure you discuss any questions you have with your health care provider. Document Released: 06/11/2011 Document Revised: 06/15/2016 Document Reviewed: 06/15/2016 Elsevier Interactive Patient Education  2018 Elsevier Inc.  

## 2017-07-28 NOTE — Telephone Encounter (Signed)
noted 

## 2017-07-28 NOTE — Telephone Encounter (Signed)
Spoke to [place]. Gave verbal orders

## 2017-07-28 NOTE — Assessment & Plan Note (Signed)
Compensated Not clear if edema from this or venous---or both Trouble with furosemide but will change back to 40mg --just not every day

## 2017-07-29 DIAGNOSIS — M5430 Sciatica, unspecified side: Secondary | ICD-10-CM | POA: Diagnosis not present

## 2017-07-29 DIAGNOSIS — I872 Venous insufficiency (chronic) (peripheral): Secondary | ICD-10-CM | POA: Diagnosis not present

## 2017-07-29 DIAGNOSIS — I509 Heart failure, unspecified: Secondary | ICD-10-CM | POA: Diagnosis not present

## 2017-07-29 DIAGNOSIS — M6281 Muscle weakness (generalized): Secondary | ICD-10-CM | POA: Diagnosis not present

## 2017-07-29 DIAGNOSIS — N183 Chronic kidney disease, stage 3 (moderate): Secondary | ICD-10-CM | POA: Diagnosis not present

## 2017-07-29 DIAGNOSIS — I130 Hypertensive heart and chronic kidney disease with heart failure and stage 1 through stage 4 chronic kidney disease, or unspecified chronic kidney disease: Secondary | ICD-10-CM | POA: Diagnosis not present

## 2017-08-02 DIAGNOSIS — I509 Heart failure, unspecified: Secondary | ICD-10-CM | POA: Diagnosis not present

## 2017-08-02 DIAGNOSIS — I130 Hypertensive heart and chronic kidney disease with heart failure and stage 1 through stage 4 chronic kidney disease, or unspecified chronic kidney disease: Secondary | ICD-10-CM | POA: Diagnosis not present

## 2017-08-02 DIAGNOSIS — M5430 Sciatica, unspecified side: Secondary | ICD-10-CM | POA: Diagnosis not present

## 2017-08-02 DIAGNOSIS — I872 Venous insufficiency (chronic) (peripheral): Secondary | ICD-10-CM | POA: Diagnosis not present

## 2017-08-02 DIAGNOSIS — M6281 Muscle weakness (generalized): Secondary | ICD-10-CM | POA: Diagnosis not present

## 2017-08-02 DIAGNOSIS — N183 Chronic kidney disease, stage 3 (moderate): Secondary | ICD-10-CM | POA: Diagnosis not present

## 2017-08-03 ENCOUNTER — Telehealth: Payer: Self-pay | Admitting: Internal Medicine

## 2017-08-03 DIAGNOSIS — N183 Chronic kidney disease, stage 3 (moderate): Secondary | ICD-10-CM | POA: Diagnosis not present

## 2017-08-03 DIAGNOSIS — I872 Venous insufficiency (chronic) (peripheral): Secondary | ICD-10-CM | POA: Diagnosis not present

## 2017-08-03 DIAGNOSIS — M5430 Sciatica, unspecified side: Secondary | ICD-10-CM | POA: Diagnosis not present

## 2017-08-03 DIAGNOSIS — I509 Heart failure, unspecified: Secondary | ICD-10-CM | POA: Diagnosis not present

## 2017-08-03 DIAGNOSIS — I130 Hypertensive heart and chronic kidney disease with heart failure and stage 1 through stage 4 chronic kidney disease, or unspecified chronic kidney disease: Secondary | ICD-10-CM | POA: Diagnosis not present

## 2017-08-03 DIAGNOSIS — M6281 Muscle weakness (generalized): Secondary | ICD-10-CM | POA: Diagnosis not present

## 2017-08-03 NOTE — Telephone Encounter (Signed)
Copied from [place]. Topic: Quick Communication - See Telephone Encounter >> Aug 03, 2017  3:19 PM Cleaster Corin, NT wrote: CRM for notification. See Telephone encounter for:   08/03/17. Stacey from [facility] calling to let Dr. Silvio Pate know that pt.has some swelling and redness to the right lower chen. Erline Levine can be reached at [phone]

## 2017-08-04 DIAGNOSIS — M19072 Primary osteoarthritis, left ankle and foot: Secondary | ICD-10-CM | POA: Diagnosis not present

## 2017-08-04 DIAGNOSIS — R609 Edema, unspecified: Secondary | ICD-10-CM | POA: Diagnosis not present

## 2017-08-04 NOTE — Telephone Encounter (Signed)
Spoke to pt's daughter, [name]. She said her mom has an appt with ortho this afternoon and will have them evaluate it further. The pt is having trouble with her phone line.

## 2017-08-04 NOTE — Telephone Encounter (Signed)
Attempted to contact pt twice; line picked up and d/c twice. Spoke to Amenia who states pts leg was red last week, but it has increased with her visit today. It was also warm to the touch, but pt reported that she has a Hx of cellulitis that Dr Silvio Pate is aware of. She was wanting to notify Dr Silvio Pate as a FYI, since there was a change from last weeks visit.

## 2017-08-04 NOTE — Telephone Encounter (Signed)
Try reaching her daughter to see what is going on. She needs to keep her leg elevated Don't want to start antibiotics unless persistent changes (likely still stasis dermatitis but I can't be sure)

## 2017-08-04 NOTE — Telephone Encounter (Signed)
Please get more information about this---including from the patient

## 2017-08-05 DIAGNOSIS — I509 Heart failure, unspecified: Secondary | ICD-10-CM | POA: Diagnosis not present

## 2017-08-05 DIAGNOSIS — I872 Venous insufficiency (chronic) (peripheral): Secondary | ICD-10-CM | POA: Diagnosis not present

## 2017-08-05 DIAGNOSIS — N183 Chronic kidney disease, stage 3 (moderate): Secondary | ICD-10-CM | POA: Diagnosis not present

## 2017-08-05 DIAGNOSIS — M5430 Sciatica, unspecified side: Secondary | ICD-10-CM | POA: Diagnosis not present

## 2017-08-05 DIAGNOSIS — M6281 Muscle weakness (generalized): Secondary | ICD-10-CM | POA: Diagnosis not present

## 2017-08-05 DIAGNOSIS — I130 Hypertensive heart and chronic kidney disease with heart failure and stage 1 through stage 4 chronic kidney disease, or unspecified chronic kidney disease: Secondary | ICD-10-CM | POA: Diagnosis not present

## 2017-08-05 NOTE — Telephone Encounter (Signed)
Okay Will wait to see what they think

## 2017-08-06 DIAGNOSIS — M5430 Sciatica, unspecified side: Secondary | ICD-10-CM | POA: Diagnosis not present

## 2017-08-06 DIAGNOSIS — I509 Heart failure, unspecified: Secondary | ICD-10-CM | POA: Diagnosis not present

## 2017-08-06 DIAGNOSIS — I872 Venous insufficiency (chronic) (peripheral): Secondary | ICD-10-CM | POA: Diagnosis not present

## 2017-08-06 DIAGNOSIS — M6281 Muscle weakness (generalized): Secondary | ICD-10-CM | POA: Diagnosis not present

## 2017-08-06 DIAGNOSIS — I130 Hypertensive heart and chronic kidney disease with heart failure and stage 1 through stage 4 chronic kidney disease, or unspecified chronic kidney disease: Secondary | ICD-10-CM | POA: Diagnosis not present

## 2017-08-06 DIAGNOSIS — N183 Chronic kidney disease, stage 3 (moderate): Secondary | ICD-10-CM | POA: Diagnosis not present

## 2017-08-10 DIAGNOSIS — M5430 Sciatica, unspecified side: Secondary | ICD-10-CM | POA: Diagnosis not present

## 2017-08-10 DIAGNOSIS — N183 Chronic kidney disease, stage 3 (moderate): Secondary | ICD-10-CM | POA: Diagnosis not present

## 2017-08-10 DIAGNOSIS — I509 Heart failure, unspecified: Secondary | ICD-10-CM | POA: Diagnosis not present

## 2017-08-10 DIAGNOSIS — I130 Hypertensive heart and chronic kidney disease with heart failure and stage 1 through stage 4 chronic kidney disease, or unspecified chronic kidney disease: Secondary | ICD-10-CM | POA: Diagnosis not present

## 2017-08-10 DIAGNOSIS — I872 Venous insufficiency (chronic) (peripheral): Secondary | ICD-10-CM | POA: Diagnosis not present

## 2017-08-10 DIAGNOSIS — M6281 Muscle weakness (generalized): Secondary | ICD-10-CM | POA: Diagnosis not present

## 2017-08-11 DIAGNOSIS — I130 Hypertensive heart and chronic kidney disease with heart failure and stage 1 through stage 4 chronic kidney disease, or unspecified chronic kidney disease: Secondary | ICD-10-CM | POA: Diagnosis not present

## 2017-08-11 DIAGNOSIS — N183 Chronic kidney disease, stage 3 (moderate): Secondary | ICD-10-CM | POA: Diagnosis not present

## 2017-08-11 DIAGNOSIS — I872 Venous insufficiency (chronic) (peripheral): Secondary | ICD-10-CM | POA: Diagnosis not present

## 2017-08-11 DIAGNOSIS — M5430 Sciatica, unspecified side: Secondary | ICD-10-CM | POA: Diagnosis not present

## 2017-08-11 DIAGNOSIS — I509 Heart failure, unspecified: Secondary | ICD-10-CM | POA: Diagnosis not present

## 2017-08-11 DIAGNOSIS — M6281 Muscle weakness (generalized): Secondary | ICD-10-CM | POA: Diagnosis not present

## 2017-08-12 DIAGNOSIS — N183 Chronic kidney disease, stage 3 (moderate): Secondary | ICD-10-CM | POA: Diagnosis not present

## 2017-08-12 DIAGNOSIS — M6281 Muscle weakness (generalized): Secondary | ICD-10-CM | POA: Diagnosis not present

## 2017-08-12 DIAGNOSIS — I509 Heart failure, unspecified: Secondary | ICD-10-CM | POA: Diagnosis not present

## 2017-08-12 DIAGNOSIS — M5430 Sciatica, unspecified side: Secondary | ICD-10-CM | POA: Diagnosis not present

## 2017-08-12 DIAGNOSIS — I872 Venous insufficiency (chronic) (peripheral): Secondary | ICD-10-CM | POA: Diagnosis not present

## 2017-08-12 DIAGNOSIS — I130 Hypertensive heart and chronic kidney disease with heart failure and stage 1 through stage 4 chronic kidney disease, or unspecified chronic kidney disease: Secondary | ICD-10-CM | POA: Diagnosis not present

## 2017-08-17 DIAGNOSIS — I872 Venous insufficiency (chronic) (peripheral): Secondary | ICD-10-CM | POA: Diagnosis not present

## 2017-08-17 DIAGNOSIS — I509 Heart failure, unspecified: Secondary | ICD-10-CM | POA: Diagnosis not present

## 2017-08-17 DIAGNOSIS — N183 Chronic kidney disease, stage 3 (moderate): Secondary | ICD-10-CM | POA: Diagnosis not present

## 2017-08-17 DIAGNOSIS — I130 Hypertensive heart and chronic kidney disease with heart failure and stage 1 through stage 4 chronic kidney disease, or unspecified chronic kidney disease: Secondary | ICD-10-CM | POA: Diagnosis not present

## 2017-08-17 DIAGNOSIS — M6281 Muscle weakness (generalized): Secondary | ICD-10-CM | POA: Diagnosis not present

## 2017-08-17 DIAGNOSIS — M5430 Sciatica, unspecified side: Secondary | ICD-10-CM | POA: Diagnosis not present

## 2017-08-19 DIAGNOSIS — M5430 Sciatica, unspecified side: Secondary | ICD-10-CM | POA: Diagnosis not present

## 2017-08-19 DIAGNOSIS — N183 Chronic kidney disease, stage 3 (moderate): Secondary | ICD-10-CM | POA: Diagnosis not present

## 2017-08-19 DIAGNOSIS — I509 Heart failure, unspecified: Secondary | ICD-10-CM | POA: Diagnosis not present

## 2017-08-19 DIAGNOSIS — I130 Hypertensive heart and chronic kidney disease with heart failure and stage 1 through stage 4 chronic kidney disease, or unspecified chronic kidney disease: Secondary | ICD-10-CM | POA: Diagnosis not present

## 2017-08-19 DIAGNOSIS — I872 Venous insufficiency (chronic) (peripheral): Secondary | ICD-10-CM | POA: Diagnosis not present

## 2017-08-19 DIAGNOSIS — M6281 Muscle weakness (generalized): Secondary | ICD-10-CM | POA: Diagnosis not present

## 2017-08-25 ENCOUNTER — Telehealth: Payer: Self-pay | Admitting: Internal Medicine

## 2017-08-25 DIAGNOSIS — M6281 Muscle weakness (generalized): Secondary | ICD-10-CM | POA: Diagnosis not present

## 2017-08-25 DIAGNOSIS — N183 Chronic kidney disease, stage 3 (moderate): Secondary | ICD-10-CM | POA: Diagnosis not present

## 2017-08-25 DIAGNOSIS — I872 Venous insufficiency (chronic) (peripheral): Secondary | ICD-10-CM | POA: Diagnosis not present

## 2017-08-25 DIAGNOSIS — I130 Hypertensive heart and chronic kidney disease with heart failure and stage 1 through stage 4 chronic kidney disease, or unspecified chronic kidney disease: Secondary | ICD-10-CM | POA: Diagnosis not present

## 2017-08-25 DIAGNOSIS — I509 Heart failure, unspecified: Secondary | ICD-10-CM | POA: Diagnosis not present

## 2017-08-25 DIAGNOSIS — M5430 Sciatica, unspecified side: Secondary | ICD-10-CM | POA: Diagnosis not present

## 2017-08-25 MED ORDER — FUROSEMIDE 40 MG PO TABS: 40.0000 mg | ORAL_TABLET | ORAL | 1 refills | Status: DC

## 2017-08-25 NOTE — Telephone Encounter (Signed)
Rx sent electronically.  

## 2017-08-25 NOTE — Telephone Encounter (Signed)
Copied from [place]. Topic: Quick Communication - Rx Refill/Question >> Aug 25, 2017  9:56 AM Margot Ables wrote: Medication: lasix 40mg  - pt daughter called for refill bc pt meds are 83 years old Has the patient contacted their pharmacy? No. Expired RX Preferred Pharmacy (with phone number or street name): [id number] [place], [address] [place] [address] Phone: [phone] Fax: [phone]  Agent: Please be advised that RX refills may take up to 3 business days. We ask that you follow-up with your pharmacy.

## 2017-09-01 DIAGNOSIS — I130 Hypertensive heart and chronic kidney disease with heart failure and stage 1 through stage 4 chronic kidney disease, or unspecified chronic kidney disease: Secondary | ICD-10-CM | POA: Diagnosis not present

## 2017-09-01 DIAGNOSIS — I872 Venous insufficiency (chronic) (peripheral): Secondary | ICD-10-CM | POA: Diagnosis not present

## 2017-09-01 DIAGNOSIS — M6281 Muscle weakness (generalized): Secondary | ICD-10-CM | POA: Diagnosis not present

## 2017-09-01 DIAGNOSIS — M5430 Sciatica, unspecified side: Secondary | ICD-10-CM | POA: Diagnosis not present

## 2017-09-01 DIAGNOSIS — I509 Heart failure, unspecified: Secondary | ICD-10-CM | POA: Diagnosis not present

## 2017-09-01 DIAGNOSIS — N183 Chronic kidney disease, stage 3 (moderate): Secondary | ICD-10-CM | POA: Diagnosis not present

## 2017-09-02 ENCOUNTER — Ambulatory Visit: Payer: Medicare Other | Admitting: Internal Medicine

## 2017-09-13 ENCOUNTER — Encounter: Payer: Self-pay | Admitting: Internal Medicine

## 2017-09-13 ENCOUNTER — Ambulatory Visit (INDEPENDENT_AMBULATORY_CARE_PROVIDER_SITE_OTHER): Payer: Medicare Other | Admitting: Internal Medicine

## 2017-09-13 VITALS — BP 140/80 | HR 85 | Temp 98.0°F | Wt 264.0 lb

## 2017-09-13 DIAGNOSIS — I872 Venous insufficiency (chronic) (peripheral): Secondary | ICD-10-CM | POA: Diagnosis not present

## 2017-09-13 DIAGNOSIS — I5032 Chronic diastolic (congestive) heart failure: Secondary | ICD-10-CM

## 2017-09-13 DIAGNOSIS — N183 Chronic kidney disease, stage 3 unspecified: Secondary | ICD-10-CM

## 2017-09-13 DIAGNOSIS — I2699 Other pulmonary embolism without acute cor pulmonale: Secondary | ICD-10-CM

## 2017-09-13 MED ORDER — FUROSEMIDE 40 MG PO TABS: 80.0000 mg | ORAL_TABLET | ORAL | 1 refills | Status: DC

## 2017-09-13 MED ORDER — GABAPENTIN 100 MG PO CAPS: 100.0000 mg | ORAL_CAPSULE | Freq: Two times a day (BID) | ORAL | 3 refills | Status: DC

## 2017-09-13 MED ORDER — APIXABAN 5 MG PO TABS: 5.0000 mg | ORAL_TABLET | Freq: Two times a day (BID) | ORAL | 3 refills | Status: DC

## 2017-09-13 NOTE — Assessment & Plan Note (Addendum)
Seems to be the reason for the pain in calves Went to ortho to see about fusion--he wouldn't do it with this swelling Will try higher dose of furosemide Discussed UNNA boots--but she wasn't excited about this

## 2017-09-13 NOTE — Progress Notes (Signed)
Subjective:    Patient ID: Chelsea Keller, female    DOB: 08/07/1934, 82 y.o.   MRN: [id number]  HPI Here for follow up of PE, CHF, etc With daughter as usual  Not doing well Feels "horrible" every day Still having trouble breathing--feels her chest is "congested" No fever and not sick Chest feels "heavy" Some cough---slightly productive  "Lasix not working"--taking it every other day Some redness in right calf--worried about DVT Has pain there--posteriorly  Current Outpatient Medications on File Prior to Visit  Medication Sig Dispense Refill  . acetaminophen (TYLENOL) 325 MG tablet Take 2 tablets (650 mg total) by mouth every 6 (six) hours as needed for mild pain.    Marland Kitchen apixaban (ELIQUIS) 5 MG TABS tablet Take 5 mg by mouth 2 (two) times daily.    . furosemide (LASIX) 40 MG tablet Take 1 tablet (40 mg total) by mouth every other day. As needed for leg swelling 30 tablet 1  . gabapentin (NEURONTIN) 100 MG capsule Take 100 mg by mouth 2 (two) times daily.    . potassium chloride SA (K-DUR,KLOR-CON) 20 MEQ tablet TAKE 2 TABLETS (40 MEQ TOTAL) DAILY 180 tablet 3  . triamterene-hydrochlorothiazide (MAXZIDE-25) 37.5-25 MG tablet Take 1 tablet by mouth daily. 90 tablet 3   No current facility-administered medications on file prior to visit.     Allergies  Allergen Reactions  . Codeine Sulfate Shortness Of Breath  . Celecoxib Other (See Comments)    Pt bled out  . Erythromycin Base Nausea And Vomiting    Past Medical History:  Diagnosis Date  . Allergy   . Anemia   . Anxiety   . Arthritis   . Chronic diastolic CHF (congestive heart failure) (HCC)    a. Echo 1/16:  mild LVH, EF normal, grade 1 DD, MAC  . Chronic venous insufficiency    chronic LE edema  . Depression   . Fibromyalgia    constant pain  . Hx of cardiac catheterization    a. LHC in [place] "ok" per patient with mild plaque in a single vessel - records not available  . Hx of cardiovascular stress test    a.  Nuclear study in 2008 normal  . Hx of colonic polyps   . Hypertension   . Hypertriglyceridemia   . Impaired fasting glucose   . PONV (postoperative nausea and vomiting)   . PUD (peptic ulcer disease)    hx of gastric ulcer  . Pulmonary emboli ([place]) 06/2017  . Vitamin B12 deficiency     Past Surgical History:  Procedure Laterality Date  . ABDOMINAL HYSTERECTOMY    . CATARACT EXTRACTION  10/2003   OD  . CHOLECYSTECTOMY    . COLONOSCOPY W/ POLYPECTOMY    . COLONOSCOPY WITH PROPOFOL N/A 10/15/2014   Procedure: COLONOSCOPY WITH PROPOFOL;  Surgeon: Ladene Artist, MD;  Location: [facility];  Service: Endoscopy;  Laterality: N/A;  . ESOPHAGOGASTRODUODENOSCOPY (EGD) WITH PROPOFOL N/A 10/15/2014   Procedure: ESOPHAGOGASTRODUODENOSCOPY (EGD) WITH PROPOFOL;  Surgeon: Ladene Artist, MD;  Location: [facility];  Service: Endoscopy;  Laterality: N/A;  . EXTERNAL FIXATION ANKLE FRACTURE     Fx.  left ankle-fixation with pins later removed sec to infection 1985  . EXTERNAL FIXATION WRIST FRACTURE  1985   left with pins  . EYE SURGERY    . FEMUR FRACTURE SURGERY  06/2009  . FRACTURE SURGERY    . HARDWARE REMOVAL Left 10/05/2013   Procedure: HARDWARE REMOVAL LEFT DISTAL FEMUR;  Surgeon:  Rozanna Box, MD;  Location: [facility];  Service: Orthopedics;  Laterality: Left;  . HEMIARTHROPLASTY SHOULDER FRACTURE  06/2009  . JOINT REPLACEMENT    . STERIOD INJECTION Right 10/05/2013   Procedure: STEROID INJECTION;  Surgeon: Rozanna Box, MD;  Location: [place];  Service: Orthopedics;  Laterality: Right;  . TONSILLECTOMY    . TONSILLECTOMY    . TOTAL KNEE ARTHROPLASTY  03/09   left    Family History  Problem Relation Age of Onset  . Depression Mother   . Cancer Mother        uterine cancer  . Heart attack Father   . Cancer Brother        prostate cancer  . Diabetes Maternal Aunt   . Arthritis Brother   . Asthma Brother   . Stroke Maternal Grandmother   . Pulmonary embolism Daughter      Social History   Socioeconomic History  . Marital status: Widowed    Spouse name: Not on file  . Number of children: 4  . Years of education: Not on file  . Highest education level: Not on file  Social Needs  . Financial resource strain: Not on file  . Food insecurity - worry: Not on file  . Food insecurity - inability: Not on file  . Transportation needs - medical: Not on file  . Transportation needs - non-medical: Not on file  Occupational History  . Occupation: retired crossing guard  . 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 Smoking status: Former Smoker    Packs/day: 1.00    Years: 40.00    Pack years: 40.00    Types: Cigarettes    Last attempt to quit: 07/06/1993    Years since quitting: 24.2  . Smokeless tobacco: Never Used  Substance and Sexual Activity  . Alcohol use: No    Alcohol/week: 0.0 oz  . Drug use: No  . Sexual activity: Not on file  Other Topics Concern  . Not on file  Social History Narrative   No living will   Plans to do health care POA forms---wants daughter [name]   Would accept resuscitation attempts---no prolonged ventilation   Absolutely no feeding tube   Review of Systems Appetite is fair--but usually only eats 1 meal a day and then snacks a lot ([employer] cookies) Discussed but she knows it is not healthy Bluish lesion on left forehead (discussed---sebhorheic keratosis)    Objective:   Physical Exam  Constitutional: No distress.  Neck: No thyromegaly present.  Cardiovascular: Normal rate, regular rhythm and normal heart sounds. Exam reveals no gallop.  No murmur heard. Pulmonary/Chest: Effort normal and breath sounds normal. No respiratory distress. She has no wheezes. She has no rales.  Musculoskeletal:  2+ swelling in calves Some tenderness--especially on right Stasis changes bilaterally  Lymphadenopathy:    She has no cervical adenopathy.  Psychiatric:  Frustrated by not feeling well           Assessment & Plan:

## 2017-09-13 NOTE — Assessment & Plan Note (Signed)
Not exacerbated Ongoing edema but chest seems clear

## 2017-09-13 NOTE — Patient Instructions (Signed)
Please try taking the furosemide as 2 tabs (total of 80mg ) every other day--to see if this works better.

## 2017-09-13 NOTE — Assessment & Plan Note (Signed)
Will recheck labs---try higher furosemide

## 2017-09-13 NOTE — Assessment & Plan Note (Signed)
On eliquis Doubt her calf pain could be DVT will on the eliquis--will try to diurese better

## 2017-09-14 ENCOUNTER — Telehealth: Payer: Self-pay | Admitting: Internal Medicine

## 2017-09-14 ENCOUNTER — Telehealth: Payer: Self-pay

## 2017-09-14 LAB — RENAL FUNCTION PANEL
ALBUMIN: 3.5 g/dL (ref 3.5–5.2)
BUN: 24 mg/dL — ABNORMAL HIGH (ref 6–23)
CHLORIDE: 99 meq/L (ref 96–112)
CO2: 30 mEq/L (ref 19–32)
Calcium: 9.5 mg/dL (ref 8.4–10.5)
Creatinine, Ser: 1.13 mg/dL (ref 0.40–1.20)
GFR: 49.13 mL/min — ABNORMAL LOW (ref >60.00)
Glucose, Bld: 125 mg/dL — ABNORMAL HIGH (ref 70–99)
POTASSIUM: 3.7 meq/L (ref 3.5–5.1)
Phosphorus: 3.4 mg/dL (ref 2.3–4.6)
SODIUM: 139 meq/L (ref 135–145)

## 2017-09-14 MED ORDER — APIXABAN 5 MG PO TABS: 5.0000 mg | ORAL_TABLET | Freq: Two times a day (BID) | ORAL | 0 refills | Status: DC

## 2017-09-14 NOTE — Telephone Encounter (Signed)
Form filled out and faxed to [facility]

## 2017-09-14 NOTE — Telephone Encounter (Signed)
2 week refill given until [facility] order arrives.

## 2017-09-14 NOTE — Telephone Encounter (Signed)
Approved until 09-14-18

## 2017-09-14 NOTE — Telephone Encounter (Signed)
Copied from [place] [phone]. Topic: Quick Communication - Rx Refill/Question >> Sep 14, 2017 10:01 AM Synthia Innocent wrote: Medication: apixaban (ELIQUIS) 5 MG TABS tablet , need 2 weeks worth sent to local pharmacy, while she [facility]   Has the patient contacted their pharmacy? Yes.     (Agent: If no, request that the patient contact the pharmacy for the refill.)   Preferred Pharm[facility] (with phone number [facility] street [facility]: Please be advised that RX refills may take up to 3 business days. We ask that you follow-up with your pharmacy.

## 2017-09-15 ENCOUNTER — Encounter: Payer: Self-pay | Admitting: Cardiovascular Disease

## 2017-09-15 ENCOUNTER — Ambulatory Visit (INDEPENDENT_AMBULATORY_CARE_PROVIDER_SITE_OTHER): Payer: Medicare Other | Admitting: Cardiovascular Disease

## 2017-09-15 VITALS — BP 116/50 | HR 90 | Ht 62.0 in | Wt 263.2 lb

## 2017-09-15 DIAGNOSIS — I5033 Acute on chronic diastolic (congestive) heart failure: Secondary | ICD-10-CM | POA: Diagnosis not present

## 2017-09-15 DIAGNOSIS — I10 Essential (primary) hypertension: Secondary | ICD-10-CM

## 2017-09-15 NOTE — Patient Instructions (Signed)

## 2017-09-15 NOTE — Progress Notes (Signed)
0  Chief Complaint  Patient presents with  . Follow-up    diastolic chf    History of Present Illness: 83 yo female with history of chronic diastolic CHF, PE, obesity, HTN and chronic lower extremity edema here today for cardiac follow up. She has a remote history of tobacco abuse. She has had a normal myoview stress test in April of 2008. Her echo in May 2010 showed normal LV function and no significant valvular issues. I saw her in January 2016 and she c/o weakness, dizziness, dyspnea.  She was seen in primary care but chest x-ray was ok. She has chronic lower extremity edema and had been off of her Lasix. She endorsed a cough productive of clear sputum. She was admitted to [facility] for diuresis and aldactone was added. Echo with normal LV function in 2016. CTA chest negative for PE (elevated d-dimer). She was found to be anemic with Hgb of 8.7 (microcytic hypochromic). She was referred to GI and underwent EGD and colonoscopy 10/15/14. This showed gastritis, AVMs in the colon, polyps and divertculosis, hemorrhoids. Her chronic dyspnea is felt to be due to her obesity, deconditioning and anemia. Most recent echo in December 2018 with normal LV systolic function, grade 1 diastolic dysfunction and trivial AI. She was admitted to [facility] December 2018 with dyspnea and was found to have bilateral pulmonary emboli. She was started on Eliquis.  She is here today for follow up. The patient denies any chest pain, dyspnea, palpitations, lower extremity edema, orthopnea, PND, dizziness, near syncope or syncope.   Primary Care Physician: Venia Carbon, MD  Past Medical History:  Diagnosis Date  . Allergy   . Anemia   . Anxiety   . Arthritis   . Chronic diastolic CHF (congestive heart failure) (HCC)    a. Echo 1/16:  mild LVH, EF normal, grade 1 DD, MAC  . Chronic venous insufficiency    chronic LE edema  . Depression   . Fibromyalgia    constant pain  . Hx of cardiac catheterization    a. LHC in [place]  "ok" per patient with mild plaque in a single vessel - records not available  . Hx of cardiovascular stress test    a. Nuclear study in 2008 normal  . Hx of colonic polyps   . Hypertension   . Hypertriglyceridemia   . Impaired fasting glucose   . PONV (postoperative nausea and vomiting)   . PUD (peptic ulcer disease)    hx of gastric ulcer  . Pulmonary emboli ([place]) 06/2017  . Vitamin B12 deficiency     Past Surgical History:  Procedure Laterality Date  . ABDOMINAL HYSTERECTOMY    . CATARACT EXTRACTION  10/2003   OD  . CHOLECYSTECTOMY    . COLONOSCOPY W/ POLYPECTOMY    . COLONOSCOPY WITH PROPOFOL N/A 10/15/2014   Procedure: COLONOSCOPY WITH PROPOFOL;  Surgeon: Ladene Artist, MD;  Location: [facility];  Service: Endoscopy;  Laterality: N/A;  . ESOPHAGOGASTRODUODENOSCOPY (EGD) WITH PROPOFOL N/A 10/15/2014   Procedure: ESOPHAGOGASTRODUODENOSCOPY (EGD) WITH PROPOFOL;  Surgeon: Ladene Artist, MD;  Location: [facility];  Service: Endoscopy;  Laterality: N/A;  . EXTERNAL FIXATION ANKLE FRACTURE     Fx.  left ankle-fixation with pins later removed sec to infection 1985  . EXTERNAL FIXATION WRIST FRACTURE  1985   left with pins  . EYE SURGERY    . FEMUR FRACTURE SURGERY  06/2009  . FRACTURE SURGERY    . HARDWARE REMOVAL Left 10/05/2013  Procedure: HARDWARE REMOVAL LEFT DISTAL FEMUR;  Surgeon: Rozanna Box, MD;  Location: [place];  Service: Orthopedics;  Laterality: Left;  . HEMIARTHROPLASTY SHOULDER FRACTURE  06/2009  . JOINT REPLACEMENT    . STERIOD INJECTION Right 10/05/2013   Procedure: STEROID INJECTION;  Surgeon: Rozanna Box, MD;  Location: [place];  Service: Orthopedics;  Laterality: Right;  . TONSILLECTOMY    . TONSILLECTOMY    . TOTAL KNEE ARTHROPLASTY  03/09   left    Current Outpatient Medications  Medication Sig Dispense Refill  . acetaminophen (TYLENOL) 325 MG tablet Take 2 tablets (650 mg total) by mouth every 6 (six) hours as needed for mild pain.    Marland Kitchen  apixaban (ELIQUIS) 5 MG TABS tablet Take 1 tablet (5 mg total) by mouth 2 (two) times daily. 30 tablet 0  . furosemide (LASIX) 40 MG tablet Take 2 tablets (80 mg total) by mouth every other day. 60 tablet 1  . gabapentin (NEURONTIN) 100 MG capsule Take 1 capsule (100 mg total) by mouth 2 (two) times daily. 180 capsule 3  . potassium chloride SA (K-DUR,KLOR-CON) 20 MEQ tablet TAKE 2 TABLETS (40 MEQ TOTAL) DAILY 180 tablet 3  . triamterene-hydrochlorothiazide (MAXZIDE-25) 37.5-25 MG tablet Take 1 tablet by mouth daily. 90 tablet 3   No current facility-administered medications for this visit.     Allergies  Allergen Reactions  . Codeine Sulfate Shortness Of Breath  . Celecoxib Other (See Comments)    Pt bled out  . Erythromycin Base Nausea And Vomiting    Social History   Socioeconomic History  . Marital status: Widowed    Spouse name: Not on file  . Number of children: 4  . Years of education: Not on file  . Highest education level: Not on file  Social Needs  . Financial resource strain: Not on file  . Food insecurity - worry: Not on file  . Food insecurity - inability: Not on file  . Transportation needs - medical: Not on file  . Transportation needs - non-medical: Not on file  Occupational History  . Occupation: retired crossing guard  . 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 Smoking status: Former Smoker    Packs/day: 1.00    Years: 40.00    Pack years: 40.00    Types: Cigarettes    Last attempt to quit: 07/06/1993    Years since quitting: 24.2  . Smokeless tobacco: Never Used  Substance and Sexual Activity  . Alcohol use: No    Alcohol/week: 0.0 oz  . Drug use: No  . Sexual activity: Not on file  Other Topics Concern  . Not on file  Social History Narrative   No living will   Plans to do health care POA forms---wants daughter [name]   Would accept resuscitation attempts---no prolonged ventilation   Absolutely no feeding tube    Family  History  Problem Relation Age of Onset  . Depression Mother   . Cancer Mother        uterine cancer  . Heart attack Father   . Cancer Brother        prostate cancer  . Diabetes Maternal Aunt   . Arthritis Brother   . Asthma Brother   . Stroke Maternal Grandmother   . Pulmonary embolism Daughter     Review of Systems:  As stated in the HPI and otherwise negative.   BP (!) 116/50   Pulse 90   Ht 5'  2" (1.575 m)   Wt 263 lb 3.2 oz (119.4 kg)   SpO2 99%   BMI 48.14 kg/m   Physical Examination:  General: Well developed, well nourished, NAD  HEENT: OP clear, mucus membranes moist  SKIN: warm, dry. No rashes. Neuro: No focal deficits  Musculoskeletal: Muscle strength 5/5 all ext  Psychiatric: Mood and affect normal  Neck: No JVD, no carotid bruits, no thyromegaly, no lymphadenopathy.  Lungs:Clear bilaterally, no wheezes, rhonci, crackles Cardiovascular: Regular rate and rhythm. No murmurs, gallops or rubs. Abdomen:Soft. Bowel sounds present. Non-tender.  Extremities: No lower extremity edema. Pulses are 2 + in the bilateral DP/PT.  Echo 06/23/17: - Left ventricle: The cavity size was normal. Systolic function was   normal. The estimated ejection fraction was in the range of 55%   to 60%. Wall motion was normal; there were no regional wall   motion abnormalities. Doppler parameters are consistent with   abnormal left ventricular relaxation (grade 1 diastolic   dysfunction). - Aortic valve: There was trivial regurgitation. - Mitral valve: Calcified annulus. Mildly thickened leaflets . - Atrial septum: No defect or patent foramen ovale was identified. - Pulmonary arteries: PA peak pressure: 32 mm Hg (S).  EKG:  EKG is not ordered today. The ekg ordered today demonstrates   Recent Labs: 04/03/2017: Magnesium 2.1 06/23/2017: B Natriuretic Peptide 79.5 06/24/2017: ALT 12 06/28/2017: Hemoglobin 11.5; Platelets 355 09/13/2017: BUN 24; Creatinine, Ser 1.13; Potassium 3.7;  Sodium 139   Lipid Panel    Component Value Date/Time   CHOL 177 06/23/2017 1020   TRIG 80 06/23/2017 1020   HDL 44 06/23/2017 1020   CHOLHDL 4.0 06/23/2017 1020   VLDL 16 06/23/2017 1020   LDLCALC 117 (H) 06/23/2017 1020   LDLDIRECT 86.2 05/25/2008 1115     Wt Readings from Last 3 Encounters:  09/15/17 263 lb 3.2 oz (119.4 kg)  09/13/17 264 lb (119.7 kg)  07/28/17 267 lb (121.1 kg)     Other studies Reviewed: Additional studies/ records that were reviewed today include:  Review of the above records demonstrates:    Assessment and Plan:   1. Acute on Chronic diastolic CHF:. She has normal LV systolic function by echo in December 2018. She has grade 1 diastolic dysfunction. Weight is up over the last few weeks. Dr. Silvio Pate recently increased her Lasix to 80 mg every other day. Her weight is down several pounds over the last few days. Will continue Lasix 80 mg every other day for now. If she remains overloaded, will need to try increasing her Lasix to 80 mg  Alternating with 40 mg daily. She has had prior renal insufficiency on higher doses of Lasix. If lasix is not effective, can consider switching to Torsemide. We can also consider arranging a visit in the CHF clinic if necessary. She has follow up planned in primary care for adjustment of her diuretics.   2. HTN: BP is well controlled. No changes.   3. Bilateral PE: She is on Eliquis.   Current medicines are reviewed at length with the patient today.  The patient does not have concerns regarding medicines.  The following changes have been made:  no change  Labs/ tests ordered today include:   No orders of the defined types were placed in this encounter.    Disposition:   FU with me in 6 months   Signed, Lauree Chandler, MD 09/15/2017 3:29 PM    [place], [place]  [address] Phone: [phone]; Fax: [phone]

## 2017-09-17 DIAGNOSIS — M6281 Muscle weakness (generalized): Secondary | ICD-10-CM | POA: Diagnosis not present

## 2017-09-17 DIAGNOSIS — M5430 Sciatica, unspecified side: Secondary | ICD-10-CM | POA: Diagnosis not present

## 2017-09-17 DIAGNOSIS — I130 Hypertensive heart and chronic kidney disease with heart failure and stage 1 through stage 4 chronic kidney disease, or unspecified chronic kidney disease: Secondary | ICD-10-CM | POA: Diagnosis not present

## 2017-09-17 DIAGNOSIS — I509 Heart failure, unspecified: Secondary | ICD-10-CM | POA: Diagnosis not present

## 2017-09-17 DIAGNOSIS — I872 Venous insufficiency (chronic) (peripheral): Secondary | ICD-10-CM | POA: Diagnosis not present

## 2017-09-17 DIAGNOSIS — N183 Chronic kidney disease, stage 3 (moderate): Secondary | ICD-10-CM | POA: Diagnosis not present

## 2017-10-04 ENCOUNTER — Encounter: Payer: Self-pay | Admitting: Gastroenterology

## 2017-10-07 ENCOUNTER — Telehealth: Payer: Self-pay | Admitting: Cardiovascular Disease

## 2017-10-07 DIAGNOSIS — I5033 Acute on chronic diastolic (congestive) heart failure: Secondary | ICD-10-CM

## 2017-10-07 MED ORDER — FUROSEMIDE 80 MG PO TABS: 80.0000 mg | ORAL_TABLET | Freq: Every day | ORAL | 6 refills | Status: DC

## 2017-10-07 NOTE — Telephone Encounter (Signed)
Reviewed with Dr. Angelena Form and he would like pt to increase lasix to 80 mg every day. I spoke with pt and gave her information.  She will come in on 10/14/17 for lab work. I asked her to weigh every morning and keep record of weights.  I asked her to call if weight gain greater than 3 lbs in a day or she had progressive weight gain.

## 2017-10-07 NOTE — Telephone Encounter (Signed)
I spoke with pt. She reports swelling in right foot to calf area.  She states it is hard to tell if left foot is swollen due to previous injury to left foot.  Left ankle is constantly slightly swollen.  Right lower leg started weeping yesterday.  Had to change dressing about 6 times.  Put a dressing on last night and has not drained through this morning. Clear fluid. States Dr. Angelena Form talked with her about doctors in clinic at hospital that dealt with edema and she feels she may need to be seen there. She does not weigh daily.  Shortness of breath is the same as usual. Has not increased.  She attributes this to her weight. Current dose of furosemide is 80 mg every other day and she is to take today. She will take at this time. Will forward to Dr. Angelena Form for review/recommendations.

## 2017-10-07 NOTE — Telephone Encounter (Signed)
We had talked about her increasing Lasix if needed. She can increase to 80 mg po BID and see if this helps. I would repeat a BMET in one week. We should try increasing diuretics before she is referred to the CHF clinic. cdm

## 2017-10-07 NOTE — Telephone Encounter (Signed)
Current lasix dose is 80 mg every other day

## 2017-10-07 NOTE — Telephone Encounter (Signed)
New message    Pt c/o swelling: STAT is pt has developed SOB within 24 hours  1) How much weight have you gained and in what time span? N/A  2) If swelling, where is the swelling located? LEGS, ANKLES, RIGHT FOOT  3) Are you currently taking a fluid pill? YES  4) Are you currently SOB? NO  5) Do you have a log of your daily weights (if so, list)? NO  6) Have you gained 3 pounds in a day or 5 pounds in a week? N/  7) Have you traveled recently? NO

## 2017-10-14 ENCOUNTER — Other Ambulatory Visit: Payer: Medicare Other | Admitting: *Deleted

## 2017-10-14 DIAGNOSIS — I5033 Acute on chronic diastolic (congestive) heart failure: Secondary | ICD-10-CM

## 2017-10-14 LAB — BASIC METABOLIC PANEL
BUN / CREAT RATIO: 30 — AB (ref 12–28)
BUN: 35 mg/dL — ABNORMAL HIGH (ref 8–27)
CHLORIDE: 96 mmol/L (ref 96–106)
CO2: 26 mmol/L (ref 20–29)
CREATININE: 1.18 mg/dL — AB (ref 0.57–1.00)
Calcium: 9.1 mg/dL (ref 8.7–10.3)
GFR calc Af Amer: 50 mL/min/{1.73_m2} — ABNORMAL LOW (ref >59)
GFR calc non Af Amer: 44 mL/min/{1.73_m2} — ABNORMAL LOW (ref >59)
GLUCOSE: 134 mg/dL — AB (ref 65–99)
POTASSIUM: 3.6 mmol/L (ref 3.5–5.2)
SODIUM: 138 mmol/L (ref 134–144)

## 2017-10-25 ENCOUNTER — Ambulatory Visit (INDEPENDENT_AMBULATORY_CARE_PROVIDER_SITE_OTHER)
Admission: RE | Admit: 2017-10-25 | Discharge: 2017-10-25 | Disposition: A | Discharge status: Home / Self Care | Payer: Medicare Other | Source: Ambulatory Visit | Attending: Internal Medicine | Admitting: Internal Medicine

## 2017-10-25 ENCOUNTER — Encounter: Payer: Self-pay | Admitting: Internal Medicine

## 2017-10-25 ENCOUNTER — Ambulatory Visit (INDEPENDENT_AMBULATORY_CARE_PROVIDER_SITE_OTHER): Payer: Medicare Other | Admitting: Internal Medicine

## 2017-10-25 VITALS — BP 118/82 | HR 76 | Temp 98.0°F | Ht 62.0 in | Wt 263.0 lb

## 2017-10-25 DIAGNOSIS — I5032 Chronic diastolic (congestive) heart failure: Secondary | ICD-10-CM | POA: Diagnosis not present

## 2017-10-25 DIAGNOSIS — M25571 Pain in right ankle and joints of right foot: Secondary | ICD-10-CM | POA: Diagnosis not present

## 2017-10-25 DIAGNOSIS — I2699 Other pulmonary embolism without acute cor pulmonale: Secondary | ICD-10-CM | POA: Diagnosis not present

## 2017-10-25 DIAGNOSIS — R600 Localized edema: Secondary | ICD-10-CM | POA: Diagnosis not present

## 2017-10-25 MED ORDER — GABAPENTIN 300 MG PO CAPS: 300.0000 mg | ORAL_CAPSULE | Freq: Three times a day (TID) | ORAL | 3 refills | Status: DC

## 2017-10-25 NOTE — Assessment & Plan Note (Signed)
Seems to be compensated Taking the furosemide every other day

## 2017-10-25 NOTE — Assessment & Plan Note (Signed)
On eliquis Breathing seems to be better No chest pain

## 2017-10-25 NOTE — Patient Instructions (Signed)
Increase the gabapentin to 100mg  three times a day. Every 3 days, add another pill---- 100,100,200; then 200, 100, 200; then 200mg  three times a day. As long as you don't have any sedation, continue to increase the same way to 300mg  three times a day (this is your new prescription)

## 2017-10-25 NOTE — Assessment & Plan Note (Signed)
Has been bad in the past month No clear injury Burning character suggests neuropathy Will check x-ray Try to titrate the gabapentin

## 2017-10-25 NOTE — Progress Notes (Signed)
Subjective:    Patient ID: Chelsea Keller, female    DOB: 1935/01/18, 83 y.o.   MRN: [id number]  HPI Here for follow up with daughter  Is feeling some better--but still not good  Having more problems with her right ankle (her "good one") Has "deep, pulling, burning" pain around lateral malleolus Tylenol may help some--- 1500 mg at a time (bid)  Furosemide was increased to 61m daily by Chelsea MAngelena FormDidn't tolerate this--so down to every other day Keeping the swelling "at bay" No longer weeping and basically down in the morning  [facility] No longer can sit on walker (like in kitchen)--it hurts left ankle No chest pain  Current Outpatient Medications on File Prior to Visit  Medication Sig Dispense Refill  . acetaminophen (TYLENOL) 325 MG tablet Take 2 tablets (650 mg total) by mouth every 6 (six) hours as needed for mild pain.    .Marland Kitchenapixaban (ELIQUIS) 5 MG TABS tablet Take 1 tablet (5 mg total) by mouth 2 (two) times daily. 30 tablet 0  . furosemide (LASIX) 80 MG tablet Take 1 tablet (80 mg total) by mouth daily. 30 tablet 6  . gabapentin (NEURONTIN) 100 MG capsule Take 1 capsule (100 mg total) by mouth 2 (two) times daily. 180 capsule 3  . potassium chloride SA (K-DUR,KLOR-CON) 20 MEQ tablet TAKE 2 TABLETS (40 MEQ TOTAL) DAILY 180 tablet 3  . triamterene-hydrochlorothiazide (MAXZIDE-25) 37.5-25 MG tablet Take 1 tablet by mouth daily. 90 tablet 3   No current facility-administered medications on file prior to visit.     Allergies  Allergen Reactions  . Codeine Sulfate Shortness Of Breath  . Celecoxib Other (See Comments)    Pt bled out  . Erythromycin Base Nausea And Vomiting    Past Medical History:  Diagnosis Date  . Allergy   . Anemia   . Anxiety   . Arthritis   . Chronic diastolic CHF (congestive heart failure) (HCC)    a. Echo 1/16:  mild LVH, EF normal, grade 1 DD, MAC  . Chronic venous insufficiency    chronic LE edema  .  Depression   . Fibromyalgia    constant pain  . Hx of cardiac catheterization    [place] with mild plaque in a single vessel - records not available  . Hx of cardiovascular stress test    a. Nuclear study in 2008 normal  . Hx of colonic polyps   . Hypertension   . Hypertriglyceridemia   . Impaired fasting glucose   . PONV (postoperative nausea and vomiting)   . PUD (peptic ulcer disease)    hx of gastric ulcer  . [place] 06[place]  . Vitamin B12 deficiency     Past Surgical History:  Procedure Laterality Date  . ABDOMINAL HYSTERECTOMY    . CATARACT EXTRACTION  10/2003   OD  . CHOLECYSTECTOMY    . COLONOSCOPY W/ POLYPECTOMY    . COLONOSCOPY WITH PROPOFOL N/A 10/15/2014   Procedure: COLONOSCOPY WITH PROPOFOL;  Surgeon: MLadene Artist MD;  [facility];  Service: Endoscopy;  Laterality: N/A;  . ESOPHAGOGASTRODUODENOSCOPY (EGD) WITH PROPOFOL N/A 10/15/2014   Procedure: ESOPHAGOGASTRODUODENOSCOPY (EGD) WITH PROPOFOL;  Surgeon: MLadene Artist MD;  Location[facility];  Service: Endoscopy;  Laterality: N/A;  . EXTERNAL FIXATION ANKLE FRACTURE     Fx.  left ankle-fixation with pins later removed sec to infection 1985  . EXTERNAL FIXATION WRIST FRACTURE  1985   left with pins  . EYE SURGERY    . FEMUR FRACTURE SURGERY  06/2009  . FRACTURE SURGERY    . HARDWARE REMOVAL Left 10/05/2013   Procedure: HARDWARE REMOVAL LEFT DISTAL FEMUR;  Surgeon: Chelsea Box, MD;  Location: [place];  Service: Orthopedics;  Laterality: Left;  . HEMIARTHROPLASTY SHOULDER FRACTURE  06/2009  . JOINT REPLACEMENT    . STERIOD INJECTION Right 10/05/2013   Procedure: STEROID INJECTION;  Surgeon: Chelsea Box, MD;  Location: [place];  Service: Orthopedics;  Laterality: Right;  . TONSILLECTOMY    . TONSILLECTOMY    . TOTAL KNEE ARTHROPLASTY  03/09   left    Family History  Problem Relation Age of Onset  . Depression Mother   . Cancer Mother        uterine  cancer  . Heart attack Father   . Cancer Brother        prostate cancer  . Diabetes Maternal Aunt   . Arthritis Brother   . Asthma Brother   . Stroke Maternal Grandmother   . Pulmonary embolism Daughter     Social History   Socioeconomic History  . Marital status: Widowed    Spouse name: Not on file  . Number of children: 4  . Years of education: Not on file  . Highest education level: Not on file  Occupational History  . Occupation: retired crossing guard  . 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 Financial resource strain: Not on file  . Food insecurity:    Worry: Not on file    Inability: Not on file  . Transportation needs:    Medical: Not on file    Non-medical: Not on file  Tobacco Use  . Smoking status: Former Smoker    Packs/day: 1.00    Years: 40.00    Pack years: 40.00    Types: Cigarettes    Last attempt to quit: 07/06/1993    Years since quitting: 24.3  . Smokeless tobacco: Never Used  Substance and Sexual Activity  . Alcohol use: No    Alcohol/week: 0.0 oz  . Drug use: No  . Sexual activity: Not on file  Lifestyle  . Physical activity:    Days per week: Not on file    Minutes per session: Not on file  . Stress: Not on file  Relationships  . Social connections:    Talks on phone: Not on file    Gets together: Not on file    Attends religious service: Not on file    Active member of club or organization: Not on file    Attends meetings of clubs or organizations: Not on file    Relationship status: Not on file  . Intimate partner violence:    Fear of current or ex partner: Not on file    Emotionally abused: Not on file    Physically abused: Not on file    Forced sexual activity: Not on file  Other Topics Concern  . Not on file  Social History Narrative   No living will   Plans to do health care POA forms---wants daughter Chelsea Keller   Would accept resuscitation attempts---no prolonged ventilation   Absolutely no feeding tube     Review of Systems Not sleeping well at night--up hourly for nocturia and incontinence Is taking gabapentin 126m bid (got that in hospital) Some "wicked head rushes" ---gets lightheaded upon arising. Better if she sits then and also  better since cutting back on the furosemide to every other day    Objective:   Physical Exam  Neck: No thyromegaly present.  Cardiovascular: Normal rate, regular rhythm and normal heart sounds. Exam reveals no gallop.  No murmur heard. Pulmonary/Chest: Effort normal and breath sounds normal. No respiratory distress. She has no wheezes. She has no rales.  Musculoskeletal:  2+ ankle edema on right, 1+ on left Sensitive in right ankle (laterally) and some in foot No clear problems with ROM passively  Lymphadenopathy:    She has no cervical adenopathy.          Assessment & Plan:

## 2017-11-06 ENCOUNTER — Inpatient Hospital Stay (HOSPITAL_COMMUNITY)
Admission: EM | Admit: 2017-11-06 | Discharge: 2017-11-12 | DRG: 389 | Disposition: A | Discharge status: Skilled Nursing Facility | Payer: Medicare Other | Attending: Internal Medicine | Admitting: Internal Medicine

## 2017-11-06 ENCOUNTER — Encounter (HOSPITAL_COMMUNITY): Payer: Self-pay | Admitting: Emergency Medicine

## 2017-11-06 ENCOUNTER — Emergency Department (HOSPITAL_COMMUNITY): Payer: Medicare Other

## 2017-11-06 ENCOUNTER — Other Ambulatory Visit: Payer: Self-pay

## 2017-11-06 DIAGNOSIS — F329 Major depressive disorder, single episode, unspecified: Secondary | ICD-10-CM | POA: Diagnosis present

## 2017-11-06 DIAGNOSIS — D649 Anemia, unspecified: Secondary | ICD-10-CM

## 2017-11-06 DIAGNOSIS — M51369 Other intervertebral disc degeneration, lumbar region without mention of lumbar back pain or lower extremity pain: Secondary | ICD-10-CM | POA: Diagnosis present

## 2017-11-06 DIAGNOSIS — M797 Fibromyalgia: Secondary | ICD-10-CM | POA: Diagnosis present

## 2017-11-06 DIAGNOSIS — I130 Hypertensive heart and chronic kidney disease with heart failure and stage 1 through stage 4 chronic kidney disease, or unspecified chronic kidney disease: Secondary | ICD-10-CM | POA: Diagnosis present

## 2017-11-06 DIAGNOSIS — K5700 Diverticulitis of small intestine with perforation and abscess without bleeding: Secondary | ICD-10-CM | POA: Diagnosis not present

## 2017-11-06 DIAGNOSIS — M6281 Muscle weakness (generalized): Secondary | ICD-10-CM | POA: Diagnosis not present

## 2017-11-06 DIAGNOSIS — N183 Chronic kidney disease, stage 3 unspecified: Secondary | ICD-10-CM | POA: Diagnosis present

## 2017-11-06 DIAGNOSIS — R488 Other symbolic dysfunctions: Secondary | ICD-10-CM | POA: Diagnosis not present

## 2017-11-06 DIAGNOSIS — I10 Essential (primary) hypertension: Secondary | ICD-10-CM | POA: Diagnosis present

## 2017-11-06 DIAGNOSIS — Z86711 Personal history of pulmonary embolism: Secondary | ICD-10-CM

## 2017-11-06 DIAGNOSIS — Z818 Family history of other mental and behavioral disorders: Secondary | ICD-10-CM

## 2017-11-06 DIAGNOSIS — M2550 Pain in unspecified joint: Secondary | ICD-10-CM | POA: Diagnosis not present

## 2017-11-06 DIAGNOSIS — Z6841 Body Mass Index (BMI) 40.0 and over, adult: Secondary | ICD-10-CM | POA: Diagnosis not present

## 2017-11-06 DIAGNOSIS — M47816 Spondylosis without myelopathy or radiculopathy, lumbar region: Secondary | ICD-10-CM | POA: Diagnosis present

## 2017-11-06 DIAGNOSIS — R1084 Generalized abdominal pain: Secondary | ICD-10-CM | POA: Diagnosis not present

## 2017-11-06 DIAGNOSIS — K5730 Diverticulosis of large intestine without perforation or abscess without bleeding: Secondary | ICD-10-CM | POA: Diagnosis not present

## 2017-11-06 DIAGNOSIS — K567 Ileus, unspecified: Secondary | ICD-10-CM | POA: Diagnosis not present

## 2017-11-06 DIAGNOSIS — Z8042 Family history of malignant neoplasm of prostate: Secondary | ICD-10-CM

## 2017-11-06 DIAGNOSIS — M5136 Other intervertebral disc degeneration, lumbar region: Secondary | ICD-10-CM | POA: Diagnosis present

## 2017-11-06 DIAGNOSIS — K56609 Unspecified intestinal obstruction, unspecified as to partial versus complete obstruction: Secondary | ICD-10-CM | POA: Diagnosis not present

## 2017-11-06 DIAGNOSIS — K5669 Other partial intestinal obstruction: Secondary | ICD-10-CM | POA: Diagnosis not present

## 2017-11-06 DIAGNOSIS — M545 Low back pain: Secondary | ICD-10-CM | POA: Diagnosis not present

## 2017-11-06 DIAGNOSIS — R079 Chest pain, unspecified: Secondary | ICD-10-CM | POA: Diagnosis not present

## 2017-11-06 DIAGNOSIS — E876 Hypokalemia: Secondary | ICD-10-CM | POA: Diagnosis present

## 2017-11-06 DIAGNOSIS — G629 Polyneuropathy, unspecified: Secondary | ICD-10-CM | POA: Diagnosis present

## 2017-11-06 DIAGNOSIS — I872 Venous insufficiency (chronic) (peripheral): Secondary | ICD-10-CM | POA: Diagnosis present

## 2017-11-06 DIAGNOSIS — E441 Mild protein-calorie malnutrition: Secondary | ICD-10-CM | POA: Diagnosis present

## 2017-11-06 DIAGNOSIS — Z888 Allergy status to other drugs, medicaments and biological substances status: Secondary | ICD-10-CM

## 2017-11-06 DIAGNOSIS — R0789 Other chest pain: Secondary | ICD-10-CM | POA: Diagnosis not present

## 2017-11-06 DIAGNOSIS — Z8249 Family history of ischemic heart disease and other diseases of the circulatory system: Secondary | ICD-10-CM

## 2017-11-06 DIAGNOSIS — G8929 Other chronic pain: Secondary | ICD-10-CM | POA: Diagnosis present

## 2017-11-06 DIAGNOSIS — M1990 Unspecified osteoarthritis, unspecified site: Secondary | ICD-10-CM

## 2017-11-06 DIAGNOSIS — D509 Iron deficiency anemia, unspecified: Secondary | ICD-10-CM | POA: Diagnosis present

## 2017-11-06 DIAGNOSIS — Z5189 Encounter for other specified aftercare: Secondary | ICD-10-CM | POA: Diagnosis not present

## 2017-11-06 DIAGNOSIS — Z9049 Acquired absence of other specified parts of digestive tract: Secondary | ICD-10-CM

## 2017-11-06 DIAGNOSIS — I509 Heart failure, unspecified: Secondary | ICD-10-CM | POA: Diagnosis not present

## 2017-11-06 DIAGNOSIS — I5032 Chronic diastolic (congestive) heart failure: Secondary | ICD-10-CM | POA: Diagnosis present

## 2017-11-06 DIAGNOSIS — Z7901 Long term (current) use of anticoagulants: Secondary | ICD-10-CM | POA: Diagnosis not present

## 2017-11-06 DIAGNOSIS — M25851 Other specified joint disorders, right hip: Secondary | ICD-10-CM | POA: Diagnosis not present

## 2017-11-06 DIAGNOSIS — R278 Other lack of coordination: Secondary | ICD-10-CM | POA: Diagnosis not present

## 2017-11-06 DIAGNOSIS — Z881 Allergy status to other antibiotic agents status: Secondary | ICD-10-CM

## 2017-11-06 DIAGNOSIS — M25559 Pain in unspecified hip: Secondary | ICD-10-CM

## 2017-11-06 DIAGNOSIS — K921 Melena: Secondary | ICD-10-CM | POA: Diagnosis present

## 2017-11-06 DIAGNOSIS — N1831 Chronic kidney disease, stage 3a: Secondary | ICD-10-CM | POA: Diagnosis present

## 2017-11-06 DIAGNOSIS — M169 Osteoarthritis of hip, unspecified: Secondary | ICD-10-CM | POA: Diagnosis present

## 2017-11-06 DIAGNOSIS — M160 Bilateral primary osteoarthritis of hip: Secondary | ICD-10-CM | POA: Diagnosis present

## 2017-11-06 DIAGNOSIS — Z8049 Family history of malignant neoplasm of other genital organs: Secondary | ICD-10-CM

## 2017-11-06 DIAGNOSIS — Z8711 Personal history of peptic ulcer disease: Secondary | ICD-10-CM

## 2017-11-06 DIAGNOSIS — Z7401 Bed confinement status: Secondary | ICD-10-CM | POA: Diagnosis not present

## 2017-11-06 DIAGNOSIS — K529 Noninfective gastroenteritis and colitis, unspecified: Secondary | ICD-10-CM | POA: Diagnosis present

## 2017-11-06 DIAGNOSIS — M549 Dorsalgia, unspecified: Secondary | ICD-10-CM

## 2017-11-06 DIAGNOSIS — D631 Anemia in chronic kidney disease: Secondary | ICD-10-CM | POA: Diagnosis present

## 2017-11-06 DIAGNOSIS — Z96652 Presence of left artificial knee joint: Secondary | ICD-10-CM | POA: Diagnosis present

## 2017-11-06 DIAGNOSIS — M47818 Spondylosis without myelopathy or radiculopathy, sacral and sacrococcygeal region: Secondary | ICD-10-CM | POA: Diagnosis present

## 2017-11-06 DIAGNOSIS — R52 Pain, unspecified: Secondary | ICD-10-CM

## 2017-11-06 DIAGNOSIS — Z885 Allergy status to narcotic agent status: Secondary | ICD-10-CM

## 2017-11-06 DIAGNOSIS — M25552 Pain in left hip: Secondary | ICD-10-CM | POA: Diagnosis not present

## 2017-11-06 DIAGNOSIS — R2689 Other abnormalities of gait and mobility: Secondary | ICD-10-CM | POA: Diagnosis not present

## 2017-11-06 DIAGNOSIS — F419 Anxiety disorder, unspecified: Secondary | ICD-10-CM | POA: Diagnosis present

## 2017-11-06 DIAGNOSIS — M5440 Lumbago with sciatica, unspecified side: Secondary | ICD-10-CM | POA: Diagnosis not present

## 2017-11-06 DIAGNOSIS — M25551 Pain in right hip: Secondary | ICD-10-CM | POA: Diagnosis not present

## 2017-11-06 DIAGNOSIS — Z8261 Family history of arthritis: Secondary | ICD-10-CM

## 2017-11-06 DIAGNOSIS — Z87891 Personal history of nicotine dependence: Secondary | ICD-10-CM

## 2017-11-06 DIAGNOSIS — Z8781 Personal history of (healed) traumatic fracture: Secondary | ICD-10-CM

## 2017-11-06 DIAGNOSIS — M461 Sacroiliitis, not elsewhere classified: Secondary | ICD-10-CM | POA: Diagnosis present

## 2017-11-06 DIAGNOSIS — E785 Hyperlipidemia, unspecified: Secondary | ICD-10-CM | POA: Diagnosis not present

## 2017-11-06 DIAGNOSIS — E46 Unspecified protein-calorie malnutrition: Secondary | ICD-10-CM | POA: Diagnosis not present

## 2017-11-06 DIAGNOSIS — R627 Adult failure to thrive: Secondary | ICD-10-CM | POA: Diagnosis not present

## 2017-11-06 DIAGNOSIS — Z90710 Acquired absence of both cervix and uterus: Secondary | ICD-10-CM

## 2017-11-06 DIAGNOSIS — D500 Iron deficiency anemia secondary to blood loss (chronic): Secondary | ICD-10-CM | POA: Diagnosis not present

## 2017-11-06 DIAGNOSIS — R9431 Abnormal electrocardiogram [ECG] [EKG]: Secondary | ICD-10-CM | POA: Diagnosis not present

## 2017-11-06 HISTORY — DX: Spondylosis without myelopathy or radiculopathy, sacral and sacrococcygeal region: M47.818

## 2017-11-06 HISTORY — DX: Other intervertebral disc degeneration, lumbar region: M51.36

## 2017-11-06 HISTORY — DX: Other pulmonary embolism without acute cor pulmonale: I26.99

## 2017-11-06 HISTORY — DX: Osteoarthritis of hip, unspecified: M16.9

## 2017-11-06 LAB — CBC
HCT: 26.6 % — ABNORMAL LOW (ref 36.0–46.0)
HEMOGLOBIN: 7.5 g/dL — AB (ref 12.0–15.0)
MCH: 21.4 pg — ABNORMAL LOW (ref 26.0–34.0)
MCHC: 28.2 g/dL — AB (ref 30.0–36.0)
MCV: 75.8 fL — ABNORMAL LOW (ref 78.0–100.0)
PLATELETS: 386 10*3/uL (ref 150–400)
RBC: 3.51 MIL/uL — ABNORMAL LOW (ref 3.87–5.11)
RDW: 17.8 % — AB (ref 11.5–15.5)
WBC: 9.7 10*3/uL (ref 4.0–10.5)

## 2017-11-06 LAB — HEPATIC FUNCTION PANEL
ALBUMIN: 3.1 g/dL — AB (ref 3.5–5.0)
ALK PHOS: 77 U/L (ref 38–126)
ALT: 11 U/L — AB (ref 14–54)
AST: 18 U/L (ref 15–41)
BILIRUBIN INDIRECT: 0.7 mg/dL (ref 0.3–0.9)
Bilirubin, Direct: 0.2 mg/dL (ref 0.1–0.5)
TOTAL PROTEIN: 6.2 g/dL — AB (ref 6.5–8.1)
Total Bilirubin: 0.9 mg/dL (ref 0.3–1.2)

## 2017-11-06 LAB — IRON AND TIBC
IRON: 22 ug/dL — AB (ref 28–170)
Saturation Ratios: 5 % — ABNORMAL LOW (ref 10.4–31.8)
TIBC: 406 ug/dL (ref 250–450)
UIBC: 384 ug/dL

## 2017-11-06 LAB — RETICULOCYTES
RBC.: 3.45 MIL/uL — AB (ref 3.87–5.11)
Retic Count, Absolute: 113.9 10*3/uL (ref 19.0–186.0)
Retic Ct Pct: 3.3 % — ABNORMAL HIGH (ref 0.4–3.1)

## 2017-11-06 LAB — BASIC METABOLIC PANEL
Anion gap: 13 (ref 5–15)
BUN: 27 mg/dL — AB (ref 6–20)
CALCIUM: 9.1 mg/dL (ref 8.9–10.3)
CO2: 28 mmol/L (ref 22–32)
CREATININE: 1.02 mg/dL — AB (ref 0.44–1.00)
Chloride: 97 mmol/L — ABNORMAL LOW (ref 101–111)
GFR calc Af Amer: 58 mL/min — ABNORMAL LOW (ref >60)
GFR, EST NON AFRICAN AMERICAN: 50 mL/min — AB (ref >60)
GLUCOSE: 137 mg/dL — AB (ref 65–99)
Potassium: 3.4 mmol/L — ABNORMAL LOW (ref 3.5–5.1)
Sodium: 138 mmol/L (ref 135–145)

## 2017-11-06 LAB — POC OCCULT BLOOD, ED: Fecal Occult Bld: POSITIVE — AB

## 2017-11-06 LAB — LIPASE, BLOOD: Lipase: 43 U/L (ref 11–51)

## 2017-11-06 LAB — I-STAT TROPONIN, ED: TROPONIN I, POC: 0.02 ng/mL (ref 0.00–0.08)

## 2017-11-06 LAB — PREPARE RBC (CROSSMATCH)

## 2017-11-06 LAB — FOLATE: FOLATE: 10.1 ng/mL (ref >5.9)

## 2017-11-06 LAB — VITAMIN B12: VITAMIN B 12: 529 pg/mL (ref 180–914)

## 2017-11-06 LAB — BRAIN NATRIURETIC PEPTIDE: B NATRIURETIC PEPTIDE 5: 120.9 pg/mL — AB (ref 0.0–100.0)

## 2017-11-06 LAB — FERRITIN: Ferritin: 6 ng/mL — ABNORMAL LOW (ref 11–307)

## 2017-11-06 MED ORDER — MORPHINE SULFATE (PF) 2 MG/ML IV SOLN
2.0000 mg | INTRAVENOUS | Status: DC | PRN
  Administered 2017-11-07 – 2017-11-11 (×14): 2 mg via INTRAVENOUS
  Filled 2017-11-06 (×16): qty 1

## 2017-11-06 MED ORDER — ONDANSETRON HCL 4 MG/2ML IJ SOLN
4.0000 mg | Freq: Once | INTRAMUSCULAR | Status: AC
Start: 2017-11-06 — End: 2017-11-06
  Administered 2017-11-06: 4 mg via INTRAVENOUS
  Filled 2017-11-06: qty 2

## 2017-11-06 MED ORDER — DEXTROSE-NACL 5-0.9 % IV SOLN
INTRAVENOUS | Status: DC
  Administered 2017-11-06 – 2017-11-08 (×3): via INTRAVENOUS

## 2017-11-06 MED ORDER — ONDANSETRON HCL 4 MG PO TABS: 4.0000 mg | ORAL_TABLET | Freq: Four times a day (QID) | ORAL | Status: DC | PRN

## 2017-11-06 MED ORDER — SODIUM CHLORIDE 0.9 % IV SOLN
Freq: Once | INTRAVENOUS | Status: AC
  Administered 2017-11-07: via INTRAVENOUS

## 2017-11-06 MED ORDER — FAMOTIDINE IN NACL 20-0.9 MG/50ML-% IV SOLN
20.0000 mg | Freq: Two times a day (BID) | INTRAVENOUS | Status: DC
  Administered 2017-11-06 – 2017-11-12 (×13): 20 mg via INTRAVENOUS
  Filled 2017-11-06 (×13): qty 50

## 2017-11-06 MED ORDER — IOHEXOL 300 MG/ML  SOLN
100.0000 mL | Freq: Once | INTRAMUSCULAR | Status: AC | PRN
  Administered 2017-11-06: 100 mL via INTRAVENOUS

## 2017-11-06 MED ORDER — ONDANSETRON HCL 4 MG/2ML IJ SOLN
4.0000 mg | Freq: Four times a day (QID) | INTRAMUSCULAR | Status: DC | PRN
  Administered 2017-11-07: 4 mg via INTRAVENOUS
  Filled 2017-11-06: qty 2

## 2017-11-06 MED ORDER — MORPHINE SULFATE (PF) 4 MG/ML IV SOLN
4.0000 mg | Freq: Once | INTRAVENOUS | Status: AC
  Administered 2017-11-06: 4 mg via INTRAVENOUS
  Filled 2017-11-06: qty 1

## 2017-11-06 NOTE — ED Notes (Signed)
Patient transported to CT.  Asked to hold Pepcid until return

## 2017-11-06 NOTE — ED Triage Notes (Addendum)
Pt states around 0400 she woke up with generalized abdominal pain radiating into chest and in lower back 8/10. Also endorsees swelling and weight gain over the past week. Redness to bilateral ankles also noted , RLE with redness worse than LLE redness. Hx of CHF. Denies SOB.

## 2017-11-06 NOTE — ED Provider Notes (Signed)
[facility] EMERGENCY DEPARTMENT Provider Note   CSN: [id number] Arrival date & time: 11/06/17  1420     History   Chief Complaint Chief Complaint  Patient presents with  . Chest Pain  . Abdominal Pain  . Back Pain    HPI Elexa Kivi is a 83 y.o. female.  HPI    Presents with pain starting 4AM, burning and aching pain periumbilical and epigastric radiating side to side and to back.  Chest pain was more localized, resolved now, lasted 1 minute but because of hx of heart problems wanted to get it checked out.  Nausea but no vomiting. Had bowel movement, normal. No constipation or diarrhea, took senokot in case was constipated but didn't do anything.  No dysuria, or urinary symptoms.  Has edema and taking lasix, hip pain for last few days, feels like sciatica.  Because of pain did not want to run to the bathroom all the time, so stopped taking the lasix and now has been urinating less.  Reports no bowel movements today.  Thinks yesterday may have been a little dark. Pain currently 8/10 aching pain in the abdomen.                                                                    [facility], diagnosed with gastritis and ulcer in the past  Past Medical History:  Diagnosis Date  . Allergy   . Anemia   . Anxiety   . Arthritis   . Chronic diastolic CHF (congestive heart failure) (HCC)    a. Echo 1/16:  mild LVH, EF normal, grade 1 DD, MAC  . Chronic venous insufficiency    chronic LE edema  . Depression   . Fibromyalgia    constant pain  . Hx of cardiac catheterization    a. LHC in [place] "ok" per patient with mild plaque in a single vessel - records not available  . Hx of cardiovascular stress test    a. Nuclear study in 2008 normal  . Hx of colonic polyps   . Hypertension   . Hypertriglyceridemia   . Impaired fasting glucose   . PONV (postoperative nausea and vomiting)   . PUD (peptic ulcer disease)    hx of gastric ulcer  . Pulmonary emboli ([place])  06/2017  . Vitamin B12 deficiency     Patient Active Problem List   Diagnosis Date Noted  . Small bowel obstruction ([place]) 11/06/2017  . Right ankle pain 10/25/2017  . Neurodermatitis 07/28/2017  . Bilateral pulmonary embolism ([place]) 06/23/2017  . Intractable pain-left hip 04/03/2017  . Fibromyalgia 04/03/2017  . Anemia 04/03/2017  . Chronic diastolic CHF (congestive heart failure) ([place]) 04/03/2017  . Hypertension 04/03/2017  . Hypertriglyceridemia 04/03/2017  . Vitamin B12 deficiency 04/03/2017  . CKD (chronic kidney disease) stage 3, GFR 30-59 ml/min (HCC) 04/03/2017  . Acute hypokalemia 04/03/2017  . Left sided sciatica   . Advance directive discussed with patient 01/19/2017  . Chronic diastolic congestive heart failure ([place]) 05/09/2014  . Mood disorder ([place]) 11/03/2013  . Psoriasis 02/21/2013  . Routine general medical examination at a health care facility 06/14/2012  . BMI 50.0-59.9, adult ([place]) 12/02/2011  . VENTRICULAR HYPERTROPHY, LEFT 10/19/2008  . Sleep disturbance 10/25/2007  .  Venous (peripheral) insufficiency 08/11/2007  . Osteoarthritis, multiple sites 08/11/2007  . Impaired fasting glucose 08/11/2007  . COLONIC POLYPS, HX OF 05/20/2007  . B12 DEFICIENCY 09/30/2006  . Essential hypertension 09/30/2006  . Allergic rhinitis due to pollen 09/30/2006    Past Surgical History:  Procedure Laterality Date  . ABDOMINAL HYSTERECTOMY    . CATARACT EXTRACTION  10/2003   OD  . CHOLECYSTECTOMY    . COLONOSCOPY W/ POLYPECTOMY    . COLONOSCOPY WITH PROPOFOL N/A 10/15/2014   Procedure: COLONOSCOPY WITH PROPOFOL;  Surgeon: Ladene Artist, MD;  Location: [facility];  Service: Endoscopy;  Laterality: N/A;  . ESOPHAGOGASTRODUODENOSCOPY (EGD) WITH PROPOFOL N/A 10/15/2014   Procedure: ESOPHAGOGASTRODUODENOSCOPY (EGD) WITH PROPOFOL;  Surgeon: Ladene Artist, MD;  Location: [facility];  Service: Endoscopy;  Laterality: N/A;  . EXTERNAL FIXATION ANKLE FRACTURE     Fx.  left  ankle-fixation with pins later removed sec to infection 1985  . EXTERNAL FIXATION WRIST FRACTURE  1985   left with pins  . EYE SURGERY    . FEMUR FRACTURE SURGERY  06/2009  . FRACTURE SURGERY    . HARDWARE REMOVAL Left 10/05/2013   Procedure: HARDWARE REMOVAL LEFT DISTAL FEMUR;  Surgeon: Rozanna Box, MD;  Location: [facility];  Service: Orthopedics;  Laterality: Left;  . HEMIARTHROPLASTY SHOULDER FRACTURE  06/2009  . JOINT REPLACEMENT    . STERIOD INJECTION Right 10/05/2013   Procedure: STEROID INJECTION;  Surgeon: Rozanna Box, MD;  Location: [facility];  Service: Orthopedics;  Laterality: Right;  . TONSILLECTOMY    . TONSILLECTOMY    . TOTAL KNEE ARTHROPLASTY  03/09   left     OB History   None      Home Medications    Prior to Admission medications   Medication Sig Start Date End Date Taking? Authorizing Provider  acetaminophen (TYLENOL) 500 MG tablet Take 500 mg by mouth every 6 (six) hours as needed for headache (pain).   Yes [provider]  apixaban (ELIQUIS) 5 MG TABS tablet Take 1 tablet (5 mg total) by mouth 2 (two) times daily. 09/14/17  Yes Venia Carbon, MD  furosemide (LASIX) 80 MG tablet Take 1 tablet (80 mg total) by mouth daily. Patient taking differently: Take 160 mg by mouth daily.  10/07/17  Yes Burnell Blanks, MD  gabapentin (NEURONTIN) 100 MG capsule Take 100-200 mg by mouth See admin instructions. Tapering up to 300 mg three times daily: 5/4, 5/5, 5/6 - take one 300 mg capsule every morning and two capsules (200 mg)  at bedtime; 5/7, 5/8, 5/9 take one 300 mg capsule twice daily, 5/10, 5/11, 5/12, take one 300 mg capsule every morning and one 300 mg capsule with one capsule (100 mg) at bedtime, 5/13, 5/14, 5/15 - take 800 mg during the day (unknown directions), 5/16 and going forward take one 300 mg capsule three times daily   Yes [provider]  gabapentin (NEURONTIN) 300 MG capsule Take 1 capsule (300 mg total) by mouth 3 (three) times  daily. Patient taking differently: Take 300 mg by mouth See admin instructions. Tapering up to 300 mg three times daily: 5/4, 5/5, 5/6 - take one capsule (300 mg) every morning and two 100 mg capsules at bedtime; 5/7, 5/8, 5/9 take one capsule (300 mg) twice daily, 5/10, 5/11, 5/12, take one capsule (300 mg) every morning and one capsule (300 mg) with a 100 mg capsule at bedtime, 5/13, 5/14, 5/15 - take 800 mg during  the day (unknown directions), 5/16 and going forward take one capsule (300 mg) three times daily 10/25/17  Yes Venia Carbon, MD  potassium chloride SA (K-DUR,KLOR-CON) 20 MEQ tablet TAKE 2 TABLETS (40 MEQ TOTAL) DAILY 06/10/17  Yes Venia Carbon, MD  triamterene-hydrochlorothiazide (MAXZIDE-25) 37.5-25 MG tablet Take 1 tablet by mouth daily. 07/27/16  Yes Burnell Blanks, MD  acetaminophen (TYLENOL) 325 MG tablet Take 2 tablets (650 mg total) by mouth every 6 (six) hours as needed for mild pain. Patient not taking: Reported on 11/06/2017 06/28/17   Modena Jansky, MD    Family History Family History  Problem Relation Age of Onset  . Depression Mother   . Cancer Mother        uterine cancer  . Heart attack Father   . Cancer Brother        prostate cancer  . Diabetes Maternal Aunt   . Arthritis Brother   . Asthma Brother   . Stroke Maternal Grandmother   . Pulmonary embolism Daughter     Social History Social History   Tobacco Use  . Smoking status: Former Smoker    Packs/day: 1.00    Years: 40.00    Pack years: 40.00    Types: Cigarettes    Last attempt to quit: 07/06/1993    Years since quitting: 24.3  . Smokeless tobacco: Never Used  Substance Use Topics  . Alcohol use: No    Alcohol/week: 0.0 oz  . Drug use: No     Allergies   Codeine sulfate; Celecoxib; and Erythromycin base   Review of Systems Review of Systems  Constitutional: Negative for fever.  HENT: Negative for sore throat.   Eyes: Negative for visual disturbance.  Respiratory:  Negative for cough and shortness of breath.   Cardiovascular: Positive for chest pain.  Gastrointestinal: Positive for abdominal pain and nausea. Negative for constipation, diarrhea and vomiting.  Genitourinary: Negative for difficulty urinating.  Musculoskeletal: Negative for back pain and neck pain.  Skin: Negative for rash.  Neurological: Negative for syncope and headaches.     Physical Exam Updated Vital Signs BP 137/70   Pulse 84   Temp 98.1 F (36.7 C) (Oral)   Resp 12   Ht _0  (1.575 m)   Wt 118.7 kg (261 lb 11 oz)   SpO2 97%   BMI 47.86 kg/m   Physical Exam  Constitutional: She is oriented to person, place, and time. She appears well-developed and well-nourished. No distress.  HENT:  Head: Normocephalic and atraumatic.  Eyes: Conjunctivae and EOM are normal.  Neck: Normal range of motion.  Cardiovascular: Normal rate, regular rhythm, normal heart sounds and intact distal pulses. Exam reveals no gallop and no friction rub.  No murmur heard. Pulmonary/Chest: Effort normal and breath sounds normal. No respiratory distress. She has no wheezes. She has no rales.  Abdominal: Soft. She exhibits distension. There is tenderness. There is no guarding.  Musculoskeletal: She exhibits no edema or tenderness.  Neurological: She is alert and oriented to person, place, and time.  Skin: Skin is warm and dry. No rash noted. She is not diaphoretic. No erythema.  Nursing note and vitals reviewed.    ED Treatments / Results  Labs (all labs ordered are listed, but only abnormal results are displayed) Labs Reviewed  BASIC METABOLIC PANEL - Abnormal; Notable for the following components:      Result Value   Potassium 3.4 (*)    Chloride 97 (*)    Glucose,  Bld 137 (*)    BUN 27 (*)    Creatinine, Ser 1.02 (*)    GFR calc non Af Amer 50 (*)    GFR calc Af Amer 58 (*)    All other components within normal limits  CBC - Abnormal; Notable for the following components:   RBC 3.51  (*)    Hemoglobin 7.5 (*)    HCT 26.6 (*)    MCV 75.8 (*)    MCH 21.4 (*)    MCHC 28.2 (*)    RDW 17.8 (*)    All other components within normal limits  BRAIN NATRIURETIC PEPTIDE - Abnormal; Notable for the following components:   B Natriuretic Peptide 120.9 (*)    All other components within normal limits  HEPATIC FUNCTION PANEL - Abnormal; Notable for the following components:   Total Protein 6.2 (*)    Albumin 3.1 (*)    ALT 11 (*)    All other components within normal limits  IRON AND TIBC - Abnormal; Notable for the following components:   Iron 22 (*)    Saturation Ratios 5 (*)    All other components within normal limits  FERRITIN - Abnormal; Notable for the following components:   Ferritin 6 (*)    All other components within normal limits  RETICULOCYTES - Abnormal; Notable for the following components:   Retic Ct Pct 3.3 (*)    RBC. 3.45 (*)    All other components within normal limits  POC OCCULT BLOOD, ED - Abnormal; Notable for the following components:   Fecal Occult Bld POSITIVE (*)    All other components within normal limits  LIPASE, BLOOD  VITAMIN B12  FOLATE  COMPREHENSIVE METABOLIC PANEL  CBC  I-STAT TROPONIN, ED  TYPE AND SCREEN  PREPARE RBC (CROSSMATCH)    EKG EKG Interpretation  Date/Time:  Saturday Nov 06 2017 14:35:55 EDT Ventricular Rate:  86 PR Interval:  164 QRS Duration: 84 QT Interval:  424 QTC Calculation: 507 R Axis:   -42 Text Interpretation:  Sinus rhythm with occasional Premature ventricular complexes Left axis deviation Cannot rule out Anterior infarct , age undetermined Abnormal ECG No significant change since last tracing Confirmed by Gareth Morgan [phone]) on 11/06/2017 3:23:08 PM Also confirmed by Gareth Morgan [phone]), editor Laurena Spies [phone])  on 11/06/2017 3:57:59 PM   Radiology Dg Chest 2 View  Result Date: 11/06/2017 CLINICAL DATA:  Central chest pain. Abdominal pain since 4 a.m. Tingling in the hands. EXAM:  CHEST - 2 VIEW COMPARISON:  None. FINDINGS: Heart is enlarged. There is no edema or effusion to suggest failure. Aortic atherosclerotic changes are present. Degenerative changes are again noted in the thoracic spine. Vertebral body heights are maintained. Surgical clips are present at the gallbladder fossa. IMPRESSION: 1. Stable cardiomegaly without failure. 2. Aortic atherosclerosis. 3. No acute cardiopulmonary disease. Electronically Signed   By: San Morelle M.D.   On: 11/06/2017 15:37   Ct Abdomen Pelvis W Contrast  Result Date: 11/06/2017 CLINICAL DATA:  General abdominal pain radiates into chest. EXAM: CT ABDOMEN AND PELVIS WITH CONTRAST TECHNIQUE: Multidetector CT imaging of the abdomen and pelvis was performed using the standard protocol following bolus administration of intravenous contrast. CONTRAST:  130m OMNIPAQUE IOHEXOL 300 MG/ML  SOLN COMPARISON:  None. FINDINGS: Lower chest: Lung bases are clear. Hepatobiliary: No focal hepatic lesion. Postcholecystectomy. No biliary dilatation. Pancreas: Pancreas is normal. No ductal dilatation. No pancreatic inflammation. Spleen: Normal spleen Adrenals/urinary tract: Adrenal glands and kidneys are normal.  The ureters and bladder normal. Stomach/Bowel: The stomach duodenum normal. There is a caliber change from the jejunum to the ileum with decompressed ileum; however the jejunum is only minimally distended to 3 cm which is upper limits of normal. No obstructing lesion identified. No mass lesion identified. No pneumatosis. There is a focus of potential extraluminal gas adjacent to the small bowel (image 84/7). This could represent inflamed small bowel diverticulum. Seen on image 83/7 and image 49/4. Terminal ileum is normal. Appendix not identified. The colon is collapsed. Several diverticula sigmoid colon. Vascular/Lymphatic: Abdominal aorta is normal caliber with atherosclerotic calcification. There is no retroperitoneal or periportal lymphadenopathy.  No pelvic lymphadenopathy. The SMA and celiac trunk Sir open. Reproductive: Post hysterectomy Other: No free fluid. Musculoskeletal: No aggressive osseous lesion. IMPRESSION: 1. Small bowel caliber transition from jejunum to the ileum with minimal distention of the proximal small bowel suggest partial or intermittent obstruction. There is a focus of inflammation and potential small bowel inflamed diverticulum centrally in the mesentery of the mid small bowel. No high-grade obstructing lesion identified. No pneumatosis or portal venous gas. The colon is collapsed. 2. Atherosclerotic calcification the aorta and branches. The SMA, celiac trunk and IMA are patent. 3. Postcholecystectomy. Electronically Signed   By: Suzy Bouchard M.D.   On: 11/06/2017 17:19    Procedures Procedures (including critical care time)  Medications Ordered in ED Medications  famotidine (PEPCID) IVPB 20 mg premix (0 mg Intravenous Stopped 11/07/17 0104)  dextrose 5 %-0.9 % sodium chloride infusion ( Intravenous New Bag/Given 11/06/17 2200)  ondansetron (ZOFRAN) tablet 4 mg (has no administration in time range)    Or  ondansetron (ZOFRAN) injection 4 mg (has no administration in time range)  morphine 2 MG/ML injection 2 mg (has no administration in time range)  0.9 %  sodium chloride infusion (has no administration in time range)  ondansetron (ZOFRAN) injection 4 mg (4 mg Intravenous Given 11/06/17 1622)  iohexol (OMNIPAQUE) 300 MG/ML solution 100 mL (100 mLs Intravenous Contrast Given 11/06/17 1631)  morphine 4 MG/ML injection 4 mg (4 mg Intravenous Given 11/06/17 1622)     Initial Impression / Assessment and Plan / ED Course  I have reviewed the triage vital signs and the nursing notes.  Pertinent labs & imaging results that were available during my care of the patient were reviewed by me and considered in my medical decision making (see chart for details).    83 year old female with a history of chronic diastolic congestive  heart failure, fibromyalgia, hypertension, hyperlipidemia, bilateral pulmonary emboli on eliquis, who presents with concern for abdominal pain and nausea.  Reports brief episode of chest pain. EKG without acute changes, troponin negative.    DDx includes choledocolithiasis, pancreatitis, PUD, SBO.  CT abdomen pelvis done shows likely small bowel obstruction.  Hgb 7.5, previously 11 in December. Denies symptoms of bleeding today, rectal exam performed showing norma colored stool.  Has hx of PUD in past, ordered famotidine IV.  Unclear acuity at this time of decreased hemoglobin.   Consulted Dr. Redmond Pulling of surgery, will admit to hospitalist service for continued care of SBO, anemia.   Final Clinical Impressions(s) / ED Diagnoses   Final diagnoses:  Anemia, unspecified type  SBO (small bowel obstruction) [facility])    ED Discharge Orders    None       Gareth Morgan, MD 11/07/17 [phone]

## 2017-11-06 NOTE — Consult Note (Signed)
Reason for Consult:psbo Referring Physician: Dr Jefferson Fuel is an 82 y.o. female.  HPI: 83 yo WF with HTN, chronic diastolic HF, LE venous insufficiency/edema, Pulmonary emboli (06/2017) on Eliquis came to ED for abdominal pain which started early this am. Was doing well on Friday but developed pain in mid of night. Generalized abd pain, mainly in center abd radiating to sides with nausea. No f/c/v. Last BM on Friday which she describes as normal.  Has chronic DOE. Doesn't walk that much due to chronic ankle pain. +orthopnea. Prior open GB. No flatus today.  Also had some chest discomfort as well.   Prior cscopy with AVMs, polyps in 2016 by Dr Fuller Plan.   Past Medical History:  Diagnosis Date  . Allergy   . Anemia   . Anxiety   . Arthritis   . Chronic diastolic CHF (congestive heart failure) (HCC)    a. Echo 1/16:  mild LVH, EF normal, grade 1 DD, MAC  . Chronic venous insufficiency    chronic LE edema  . Depression   . Fibromyalgia    constant pain  . Hx of cardiac catheterization    a. LHC in [place] "ok" per patient with mild plaque in a single vessel - records not available  . Hx of cardiovascular stress test    a. Nuclear study in 2008 normal  . Hx of colonic polyps   . Hypertension   . Hypertriglyceridemia   . Impaired fasting glucose   . PONV (postoperative nausea and vomiting)   . PUD (peptic ulcer disease)    hx of gastric ulcer  . Pulmonary emboli ([place]) 06/2017  . Vitamin B12 deficiency     Past Surgical History:  Procedure Laterality Date  . ABDOMINAL HYSTERECTOMY    . CATARACT EXTRACTION  10/2003   OD  . CHOLECYSTECTOMY    . COLONOSCOPY W/ POLYPECTOMY    . COLONOSCOPY WITH PROPOFOL N/A 10/15/2014   Procedure: COLONOSCOPY WITH PROPOFOL;  Surgeon: Ladene Artist, MD;  Location: [facility];  Service: Endoscopy;  Laterality: N/A;  . ESOPHAGOGASTRODUODENOSCOPY (EGD) WITH PROPOFOL N/A 10/15/2014   Procedure: ESOPHAGOGASTRODUODENOSCOPY (EGD) WITH PROPOFOL;   Surgeon: Ladene Artist, MD;  Location: [facility];  Service: Endoscopy;  Laterality: N/A;  . EXTERNAL FIXATION ANKLE FRACTURE     Fx.  left ankle-fixation with pins later removed sec to infection 1985  . EXTERNAL FIXATION WRIST FRACTURE  1985   left with pins  . EYE SURGERY    . FEMUR FRACTURE SURGERY  06/2009  . FRACTURE SURGERY    . HARDWARE REMOVAL Left 10/05/2013   Procedure: HARDWARE REMOVAL LEFT DISTAL FEMUR;  Surgeon: Rozanna Box, MD;  Location: [facility];  Service: Orthopedics;  Laterality: Left;  . HEMIARTHROPLASTY SHOULDER FRACTURE  06/2009  . JOINT REPLACEMENT    . STERIOD INJECTION Right 10/05/2013   Procedure: STEROID INJECTION;  Surgeon: Rozanna Box, MD;  Location: [facility];  Service: Orthopedics;  Laterality: Right;  . TONSILLECTOMY    . TONSILLECTOMY    . TOTAL KNEE ARTHROPLASTY  03/09   left    Family History  Problem Relation Age of Onset  . Depression Mother   . Cancer Mother        uterine cancer  . Heart attack Father   . Cancer Brother        prostate cancer  . Diabetes Maternal Aunt   . Arthritis Brother   . Asthma Brother   . Stroke Maternal Grandmother   .  Pulmonary embolism Daughter     Social History:  reports that she quit smoking about 24 years ago. Her smoking use included cigarettes. She has a 40.00 pack-year smoking history. She has never used smokeless tobacco. She reports that she does not drink alcohol or use drugs.  Allergies:  Allergies  Allergen Reactions  . Codeine Sulfate Shortness Of Breath  . Celecoxib Other (See Comments)    Caused vaginal bleeding  . Erythromycin Base Nausea And Vomiting    Medications: I have reviewed the patient's current medications.  Results for orders placed or performed during the hospital encounter of 11/06/17 (from the past 48 hour(s))  Brain natriuretic peptide     Status: Abnormal   Collection Time: 11/06/17  2:36 PM  Result Value Ref Range   B Natriuretic Peptide 120.9 (H) 0.0 - 100.0 pg/mL     Comment: Performed at [facility], [address], [place], [place] [address]  Basic metabolic panel     Status: Abnormal   Collection Time: 11/06/17  2:40 PM  Result Value Ref Range   Sodium 138 135 - 145 mmol/L   Potassium 3.4 (L) 3.5 - 5.1 mmol/L   Chloride 97 (L) 101 - 111 mmol/L   CO2 28 22 - 32 mmol/L   Glucose, Bld 137 (H) 65 - 99 mg/dL   BUN 27 (H) 6 - 20 mg/dL   Creatinine, Ser 1.02 (H) 0.44 - 1.00 mg/dL   Calcium 9.1 8.9 - 10.3 mg/dL   GFR calc non Af Amer 50 (L) >60 mL/min   GFR calc Af Amer 58 (L) >60 mL/min    Comment: (NOTE) The eGFR has been calculated using the CKD EPI equation. This calculation has not been validated in all clinical situations. eGFR's persistently <60 mL/min signify possible Chronic Kidney Disease.    Anion gap 13 5 - 15    Comment: Performed at [facility] [address] [place], [place] [address]  CBC     Status: Abnormal   Collection Time: 11/06/17  2:40 PM  Result Value Ref Range   WBC 9.7 4.0 - 10.5 K/uL   RBC 3.51 (L) 3.87 - 5.11 MIL/uL   Hemoglobin 7.5 (L) 12.0 - 15.0 g/dL   HCT 26.6 (L) 36.0 - 46.0 %   MCV 75.8 (L) 78.0 - 100.0 fL   MCH 21.4 (L) 26.0 - 34.0 pg   MCHC 28.2 (L) 30.0 - 36.0 g/dL   RDW 17.8 (H) 11.5 - 15.5 %   Platelets 386 150 - 400 K/uL    Comment: Performed at [facility], [place] [address] [place], [place] [address]  I-stat troponin, ED     Status: None   Collection Time: 11/06/17  2:50 PM  Result Value Ref Range   Troponin i, poc 0.02 0.00 - 0.08 ng/mL   Comment 3            Comment: Due to the release kinetics of cTnI, a negative result within the first hours of the onset of symptoms does not rule out myocardial infarction with certainty. If myocardial infarction is still suspected, repeat the test at appropriate intervals.   Hepatic function panel     Status: Abnormal   Collection Time: 11/06/17  4:27 PM  Result Value Ref Range   Total Protein 6.2 (L) 6.5 - 8.1 g/dL   Albumin 3.1 (L)  3.5 - 5.0 g/dL   AST 18 15 - 41 U/L   ALT 11 (L) 14 -  54 U/L   Alkaline Phosphatase 77 38 - 126 U/L   Total Bilirubin 0.9 0.3 - 1.2 mg/dL   Bilirubin, Direct 0.2 0.1 - 0.5 mg/dL   Indirect Bilirubin 0.7 0.3 - 0.9 mg/dL    Comment: Performed at [facility] [address] [place], [place] [address]  Lipase, blood     Status: None   Collection Time: 11/06/17  4:27 PM  Result Value Ref Range   Lipase 43 11 - 51 U/L    Comment: Performed at [facility], [place] [address], [place], [place] [address]  Type and screen [place]     Status: None (Preliminary result)   Collection Time: 11/06/17  4:27 PM  Result Value Ref Range   ABO/RH(D) B POS    Antibody Screen NEG    Sample Expiration      11/09/2017 Performed at [facility], [place] [address] [place], [place] [address]    Unit Number [id number]    Blood Component Type RBC LR PHER1    Unit division 00    Status of Unit ALLOCATED    Transfusion Status OK TO TRANSFUSE    Crossmatch Result Compatible    Unit Number [id number]    Blood Component Type RED CELLS,LR    Unit division 00    Status of Unit ALLOCATED    Transfusion Status OK TO TRANSFUSE    Crossmatch Result Compatible   Vitamin B12     Status: None   Collection Time: 11/06/17  4:27 PM  Result Value Ref Range   Vitamin B-12 529 180 - 914 pg/mL    Comment: (NOTE) This assay is not validated for testing neonatal or myeloproliferative syndrome specimens for Vitamin B12 levels. Performed at [facility], [place] [address], [place], [place] [address]   Folate     Status: None   Collection Time: 11/06/17  4:27 PM  Result Value Ref Range   Folate 10.1 >5.9 ng/mL    Comment: Performed at [facility], [place] [address] [place], [place] [address]  Iron and TIBC     Status: Abnormal   Collection Time: 11/06/17  4:27 PM  Result Value Ref Range   Iron 22 (L) 28 - 170 ug/dL   TIBC 406 250 - 450 ug/dL   Saturation Ratios 5 (L)  10.4 - 31.8 %   UIBC 384 ug/dL    Comment: Performed at [facility], [place] [address] [place], [place] [address]  Ferritin     Status: Abnormal   Collection Time: 11/06/17  4:27 PM  Result Value Ref Range   Ferritin 6 (L) 11 - 307 ng/mL    Comment: Performed at [facility], [place] [address], [place], [place] [address]  Reticulocytes     Status: Abnormal   Collection Time: 11/06/17  4:27 PM  Result Value Ref Range   Retic Ct Pct 3.3 (H) 0.4 - 3.1 %   RBC. 3.45 (L) 3.87 - 5.11 MIL/uL   Retic Count, Absolute 113.9 19.0 - 186.0 K/uL    Comment: Performed at [facility] [address] [place], [place] [address]  POC occult blood, ED     Status: Abnormal   Collection Time: 11/06/17  6:35 PM  Result Value Ref Range   Fecal Occult Bld POSITIVE (A) NEGATIVE  Prepare RBC     Status: None   Collection Time: 11/06/17  9:14 PM  Result Value  Ref Range   Order Confirmation      ORDER PROCESSED BY BLOOD BANK Performed at [facility], [place] [address], [place], [place] [address]     Dg Chest 2 View  Result Date: 11/06/2017 CLINICAL DATA:  Central chest pain. Abdominal pain since 4 a.m. Tingling in the hands. EXAM: CHEST - 2 VIEW COMPARISON:  None. FINDINGS: Heart is enlarged. There is no edema or effusion to suggest failure. Aortic atherosclerotic changes are present. Degenerative changes are again noted in the thoracic spine. Vertebral body heights are maintained. Surgical clips are present at the gallbladder fossa. IMPRESSION: 1. Stable cardiomegaly without failure. 2. Aortic atherosclerosis. 3. No acute cardiopulmonary disease. Electronically Signed   By: San Morelle M.D.   On: 11/06/2017 15:37   Ct Abdomen Pelvis W Contrast  Result Date: 11/06/2017 CLINICAL DATA:  General abdominal pain radiates into chest. EXAM: CT ABDOMEN AND PELVIS WITH CONTRAST TECHNIQUE: Multidetector CT imaging of the abdomen and pelvis was performed using the standard protocol following  bolus administration of intravenous contrast. CONTRAST:  167m OMNIPAQUE IOHEXOL 300 MG/ML  SOLN COMPARISON:  None. FINDINGS: Lower chest: Lung bases are clear. Hepatobiliary: No focal hepatic lesion. Postcholecystectomy. No biliary dilatation. Pancreas: Pancreas is normal. No ductal dilatation. No pancreatic inflammation. Spleen: Normal spleen Adrenals/urinary tract: Adrenal glands and kidneys are normal. The ureters and bladder normal. Stomach/Bowel: The stomach duodenum normal. There is a caliber change from the jejunum to the ileum with decompressed ileum; however the jejunum is only minimally distended to 3 cm which is upper limits of normal. No obstructing lesion identified. No mass lesion identified. No pneumatosis. There is a focus of potential extraluminal gas adjacent to the small bowel (image 84/7). This could represent inflamed small bowel diverticulum. Seen on image 83/7 and image 49/4. Terminal ileum is normal. Appendix not identified. The colon is collapsed. Several diverticula sigmoid colon. Vascular/Lymphatic: Abdominal aorta is normal caliber with atherosclerotic calcification. There is no retroperitoneal or periportal lymphadenopathy. No pelvic lymphadenopathy. The SMA and celiac trunk Sir open. Reproductive: Post hysterectomy Other: No free fluid. Musculoskeletal: No aggressive osseous lesion. IMPRESSION: 1. Small bowel caliber transition from jejunum to the ileum with minimal distention of the proximal small bowel suggest partial or intermittent obstruction. There is a focus of inflammation and potential small bowel inflamed diverticulum centrally in the mesentery of the mid small bowel. No high-grade obstructing lesion identified. No pneumatosis or portal venous gas. The colon is collapsed. 2. Atherosclerotic calcification the aorta and branches. The SMA, celiac trunk and IMA are patent. 3. Postcholecystectomy. Electronically Signed   By: SSuzy BouchardM.D.   On: 11/06/2017 17:19     Review of Systems  Constitutional: Negative for chills, fever and weight loss.  HENT: Negative for nosebleeds.   Eyes: Negative for blurred vision.  Respiratory: Negative for shortness of breath.   Cardiovascular: Positive for orthopnea and PND. Negative for chest pain and palpitations.       Denies DOE  Gastrointestinal: Positive for abdominal pain and nausea. Negative for blood in stool, constipation, diarrhea, melena and vomiting.  Genitourinary: Negative for dysuria and hematuria.  Musculoskeletal: Positive for joint pain.  Skin: Negative for itching and rash.       Chronic LE edema with intermittent cellulitis  Neurological: Negative for dizziness, focal weakness, seizures, loss of consciousness and headaches.       Denies TIAs, amaurosis fugax  Endo/Heme/Allergies: Bruises/bleeds easily.  Psychiatric/Behavioral: The patient is not nervous/anxious.    Blood pressure 134/61,  pulse 77, temperature 98.1 F (36.7 C), temperature source Oral, resp. rate 17, height 5' 2"  (1.575 m), weight 118.7 kg (261 lb 11 oz), SpO2 100 %. Physical Exam  Vitals reviewed. Constitutional: She is oriented to person, place, and time. She appears well-developed and well-nourished. No distress.  Severe obesity  HENT:  Head: Normocephalic and atraumatic.  Right Ear: External ear normal.  Left Ear: External ear normal.  Eyes: Conjunctivae are normal. No scleral icterus.  Neck: Normal range of motion. Neck supple. No tracheal deviation present. No thyromegaly present.  Cardiovascular: Normal rate and normal heart sounds.  Respiratory: Effort normal and breath sounds normal. No stridor. No respiratory distress. She has no wheezes.  GI: Soft. She exhibits no distension. There is no rebound and no guarding.    Mild TTP mainly in central abdomen. No rebound/guarding.   Musculoskeletal: She exhibits no edema or tenderness.  Lymphadenopathy:    She has no cervical adenopathy.  Neurological: She is alert  and oriented to person, place, and time. She exhibits normal muscle tone.  Skin: Skin is warm and dry. No rash noted. She is not diaphoretic. There is erythema. No pallor.  Chronic venous insuff to b/l LE, no open sores. Mild redness to RLE anteromedially  Psychiatric: She has a normal mood and affect. Her behavior is normal. Judgment and thought content normal.    Assessment/Plan: Abdominal pain Acute on chronic anemia - iron def Chronic diastolic heart failure HTN Recent Pulmonary Emboli Dec 2018 on eliquis Severe obesity Mild protein calorie malnutrition HTN Fibromyalgia  I'm not sure if she an ileus vs sb diverticulitis or psbo  I reviewed her CT scan. Her sb distension is not that impressive. Radiology is also questioning a possibly inflammed SB diverticulum.  On my review there is no surrounding fat stranding, significant or apparent inflammation, no gross free air.    I don't believe she needs NG right now.   She also doesn't have a fever, wbc, or signif inflammation on CT so I would hold off on abx for now and see how she does overnight.   I would hold eliquis.  Defer transfusion to medical team Will need to be mindful of her risk for recurrent PE/DVT Maintain IV access, type and screen  Repeat labs in am NPO except ice chips/essential meds  Leighton Ruff. Redmond Pulling, MD, FACS General, Bariatric, & Minimally Invasive Surgery [facility]   Greer Pickerel 11/06/2017, 9:50 PM

## 2017-11-06 NOTE — H&P (Signed)
History and Physical    Chelsea Keller PYK:[id number] DOB: 1935-04-08 DOA: 11/06/2017  Referring MD/NP/PA: Dr Billy Fischer  PCP: Venia Carbon, MD    Patient coming from: Home  Chief Complaint: Abdominal pain  HPI: Chelsea Keller is a 83 y.o. female with medical history significant of pulmonary embolism on chronic anticoagulation, hypertension, chronic diastolic dysfunction CHF, anemia of chronic disease who presented with abdominal pain since 4 AM. Her last bowel movement was yesterday. Patient said expressing significant nausea abdominal pain. Has not had any significant vomiting but pain was rated as 10 out of 10. Centrally located. Associated with distention of the abdomen. Patient was unable to eat today. She was seen in the ER where she was evaluated and found to have small bowel obstruction. Patient has seen occasional melena with bright red blood per rectum. In the ER however she had brown stool that is guaiac positive. She was noted to be anemic with hemoglobin dropping into the sevens.  ED Course: patient seen and evaluated. Vitals were stable. Sodium was 138 potassium 3.4 and chloride 97. Her crit is 1.02. Patient had a white count of 9.7 but hemoglobin 7.5. It was 11.5 back in December. Initial iron studies showed low iron with a reticulocyte of 113. Stool, blood was positive. CT abdomen and pelvis showed findings suggestive of small bowel obstruction  Review of Systems: As per HPI otherwise 10 point review of systems negative.    Past Medical History:  Diagnosis Date  . Allergy   . Anemia   . Anxiety   . Arthritis   . Chronic diastolic CHF (congestive heart failure) (HCC)    a. Echo 1/16:  mild LVH, EF normal, grade 1 DD, MAC  . Chronic venous insufficiency    chronic LE edema  . Depression   . Fibromyalgia    constant pain  . Hx of cardiac catheterization    a. LHC in [place] "ok" per patient with mild plaque in a single vessel - records not available  . Hx of  cardiovascular stress test    a. Nuclear study in 2008 normal  . Hx of colonic polyps   . Hypertension   . Hypertriglyceridemia   . Impaired fasting glucose   . PONV (postoperative nausea and vomiting)   . PUD (peptic ulcer disease)    hx of gastric ulcer  . Pulmonary emboli ([place]) 06/2017  . Vitamin B12 deficiency     Past Surgical History:  Procedure Laterality Date  . ABDOMINAL HYSTERECTOMY    . CATARACT EXTRACTION  10/2003   OD  . CHOLECYSTECTOMY    . COLONOSCOPY W/ POLYPECTOMY    . COLONOSCOPY WITH PROPOFOL N/A 10/15/2014   Procedure: COLONOSCOPY WITH PROPOFOL;  Surgeon: Ladene Artist, MD;  Location: [facility];  Service: Endoscopy;  Laterality: N/A;  . ESOPHAGOGASTRODUODENOSCOPY (EGD) WITH PROPOFOL N/A 10/15/2014   Procedure: ESOPHAGOGASTRODUODENOSCOPY (EGD) WITH PROPOFOL;  Surgeon: Ladene Artist, MD;  Location: [facility];  Service: Endoscopy;  Laterality: N/A;  . EXTERNAL FIXATION ANKLE FRACTURE     Fx.  left ankle-fixation with pins later removed sec to infection 1985  . EXTERNAL FIXATION WRIST FRACTURE  1985   left with pins  . EYE SURGERY    . FEMUR FRACTURE SURGERY  06/2009  . FRACTURE SURGERY    . HARDWARE REMOVAL Left 10/05/2013   Procedure: HARDWARE REMOVAL LEFT DISTAL FEMUR;  Surgeon: Rozanna Box, MD;  Location: [facility];  Service: Orthopedics;  Laterality: Left;  . HEMIARTHROPLASTY SHOULDER FRACTURE  06/2009  . JOINT REPLACEMENT    . STERIOD INJECTION Right 10/05/2013   Procedure: STEROID INJECTION;  Surgeon: Rozanna Box, MD;  Location: [facility];  Service: Orthopedics;  Laterality: Right;  . TONSILLECTOMY    . TONSILLECTOMY    . TOTAL KNEE ARTHROPLASTY  03/09   left     reports that she quit smoking about 24 years ago. Her smoking use included cigarettes. She has a 40.00 pack-year smoking history. She has never used smokeless tobacco. She reports that she does not drink alcohol or use drugs.  Allergies  Allergen Reactions  . Codeine Sulfate Shortness  Of Breath  . Celecoxib Other (See Comments)    Caused vaginal bleeding  . Erythromycin Base Nausea And Vomiting    Family History  Problem Relation Age of Onset  . Depression Mother   . Cancer Mother        uterine cancer  . Heart attack Father   . Cancer Brother        prostate cancer  . Diabetes Maternal Aunt   . Arthritis Brother   . Asthma Brother   . Stroke Maternal Grandmother   . Pulmonary embolism Daughter     Prior to Admission medications   Medication Sig Start Date End Date Taking? Authorizing Provider  acetaminophen (TYLENOL) 500 MG tablet Take 500 mg by mouth every 6 (six) hours as needed for headache (pain).   Yes [provider]  apixaban (ELIQUIS) 5 MG TABS tablet Take 1 tablet (5 mg total) by mouth 2 (two) times daily. 09/14/17  Yes Venia Carbon, MD  furosemide (LASIX) 80 MG tablet Take 1 tablet (80 mg total) by mouth daily. Patient taking differently: Take 160 mg by mouth daily.  10/07/17  Yes Burnell Blanks, MD  gabapentin (NEURONTIN) 100 MG capsule Take 100-200 mg by mouth See admin instructions. Tapering up to 300 mg three times daily: 5/4, 5/5, 5/6 - take one 300 mg capsule every morning and two capsules (200 mg)  at bedtime; 5/7, 5/8, 5/9 take one 300 mg capsule twice daily, 5/10, 5/11, 5/12, take one 300 mg capsule every morning and one 300 mg capsule with one capsule (100 mg) at bedtime, 5/13, 5/14, 5/15 - take 800 mg during the day (unknown directions), 5/16 and going forward take one 300 mg capsule three times daily   Yes [provider]  gabapentin (NEURONTIN) 300 MG capsule Take 1 capsule (300 mg total) by mouth 3 (three) times daily. Patient taking differently: Take 300 mg by mouth See admin instructions. Tapering up to 300 mg three times daily: 5/4, 5/5, 5/6 - take one capsule (300 mg) every morning and two 100 mg capsules at bedtime; 5/7, 5/8, 5/9 take one capsule (300 mg) twice daily, 5/10, 5/11, 5/12, take one capsule (300  mg) every morning and one capsule (300 mg) with a 100 mg capsule at bedtime, 5/13, 5/14, 5/15 - take 800 mg during the day (unknown directions), 5/16 and going forward take one capsule (300 mg) three times daily 10/25/17  Yes Venia Carbon, MD  potassium chloride SA (K-DUR,KLOR-CON) 20 MEQ tablet TAKE 2 TABLETS (40 MEQ TOTAL) DAILY 06/10/17  Yes Venia Carbon, MD  triamterene-hydrochlorothiazide (MAXZIDE-25) 37.5-25 MG tablet Take 1 tablet by mouth daily. 07/27/16  Yes Burnell Blanks, MD  acetaminophen (TYLENOL) 325 MG tablet Take 2 tablets (650 mg total) by mouth every 6 (six) hours as needed for mild pain. Patient not taking: Reported on 11/06/2017 06/28/17  Modena Jansky, MD    Physical Exam: Vitals:   11/06/17 1435 11/06/17 1541 11/06/17 1600 11/06/17 1714  BP: 122/68 140/74 135/60 138/71  Pulse: 85 80 82 94  Resp: 19 20 15 17   Temp: 98.3 F (36.8 C)     SpO2: 99% 99% 100% 97%      Constitutional: NAD, calm, comfortable Vitals:   11/06/17 1435 11/06/17 1541 11/06/17 1600 11/06/17 1714  BP: 122/68 140/74 135/60 138/71  Pulse: 85 80 82 94  Resp: 19 20 15 17   Temp: 98.3 F (36.8 C)     SpO2: 99% 99% 100% 97%   Eyes: PERRL, lids and conjunctivae normal ENMT: Mucous membranes are moist. Posterior pharynx clear of any exudate or lesions.Normal dentition.  Neck: normal, supple, no masses, no thyromegaly Respiratory: clear to auscultation bilaterally, no wheezing, no crackles. Normal respiratory effort. No accessory muscle use.  Cardiovascular: Regular rate and rhythm, no murmurs / rubs / gallops. No extremity edema. 2+ pedal pulses. No carotid bruits.  Abdomen: distended, tympanitic abdomen, no bowel sounds, diffusely tender Musculoskeletal: no clubbing / cyanosis. No joint deformity upper and lower extremities. Good ROM, no contractures. Normal muscle tone.  Skin: no rashes, lesions, ulcers. No induration Neurologic: CN 2-12 grossly intact. Sensation intact, DTR  normal. Strength 5/5 in all 4.  Psychiatric: Normal judgment and insight. Alert and oriented x 3. Normal mood.   Labs on Admission: I have personally reviewed following labs and imaging studies  CBC: Recent Labs  Lab 11/06/17 1440  WBC 9.7  HGB 7.5*  HCT 26.6*  MCV 75.8*  PLT 818   Basic Metabolic Panel: Recent Labs  Lab 11/06/17 1440  NA 138  K 3.4*  CL 97*  CO2 28  GLUCOSE 137*  BUN 27*  CREATININE 1.02*  CALCIUM 9.1   GFR: Estimated Creatinine Clearance: 53.1 mL/min (A) (by C-G formula based on SCr of 1.02 mg/dL (H)). Liver Function Tests: Recent Labs  Lab 11/06/17 1627  AST 18  ALT 11*  ALKPHOS 77  BILITOT 0.9  PROT 6.2*  ALBUMIN 3.1*   Recent Labs  Lab 11/06/17 1627  LIPASE 43   No results for input(s): AMMONIA in the last 168 hours. Coagulation Profile: No results for input(s): INR, PROTIME in the last 168 hours. Cardiac Enzymes: No results for input(s): CKTOTAL, CKMB, CKMBINDEX, TROPONINI in the last 168 hours. BNP (last 3 results) No results for input(s): PROBNP in the last 8760 hours. HbA1C: No results for input(s): HGBA1C in the last 72 hours. CBG: No results for input(s): GLUCAP in the last 168 hours. Lipid Profile: No results for input(s): CHOL, HDL, LDLCALC, TRIG, CHOLHDL, LDLDIRECT in the last 72 hours. Thyroid Function Tests: No results for input(s): TSH, T4TOTAL, FREET4, T3FREE, THYROIDAB in the last 72 hours. Anemia Panel: Recent Labs    11/06/17 1627  VITAMINB12 529  FOLATE 10.1  FERRITIN 6*  TIBC 406  IRON 22*  RETICCTPCT 3.3*   Urine analysis:    Component Value Date/Time   COLORURINE YELLOW 06/23/2017 0928   APPEARANCEUR CLEAR 06/23/2017 0928   LABSPEC 1.021 06/23/2017 0928   PHURINE 5.0 06/23/2017 0928   GLUCOSEU NEGATIVE 06/23/2017 0928   HGBUR NEGATIVE 06/23/2017 0928   HGBUR negative 01/13/2010 [place] 06/23/2017 0928   BILIRUBINUR negative 02/11/2017 1709   KETONESUR NEGATIVE 06/23/2017  0928   PROTEINUR NEGATIVE 06/23/2017 0928   UROBILINOGEN 0.2 02/11/2017 1709   UROBILINOGEN 0.2 01/13/2010 1056   NITRITE NEGATIVE 06/23/2017 5631  LEUKOCYTESUR NEGATIVE 06/23/2017 0928   Sepsis Labs: @LABRCNTIP (procalcitonin:4,lacticidven:4) )No results found for this or any previous visit (from the past 240 hour(s)).   Radiological Exams on Admission: Dg Chest 2 View  Result Date: 11/06/2017 CLINICAL DATA:  Central chest pain. Abdominal pain since 4 a.m. Tingling in the hands. EXAM: CHEST - 2 VIEW COMPARISON:  None. FINDINGS: Heart is enlarged. There is no edema or effusion to suggest failure. Aortic atherosclerotic changes are present. Degenerative changes are again noted in the thoracic spine. Vertebral body heights are maintained. Surgical clips are present at the gallbladder fossa. IMPRESSION: 1. Stable cardiomegaly without failure. 2. Aortic atherosclerosis. 3. No acute cardiopulmonary disease. Electronically Signed   By: San Morelle M.D.   On: 11/06/2017 15:37   Ct Abdomen Pelvis W Contrast  Result Date: 11/06/2017 CLINICAL DATA:  General abdominal pain radiates into chest. EXAM: CT ABDOMEN AND PELVIS WITH CONTRAST TECHNIQUE: Multidetector CT imaging of the abdomen and pelvis was performed using the standard protocol following bolus administration of intravenous contrast. CONTRAST:  120m OMNIPAQUE IOHEXOL 300 MG/ML  SOLN COMPARISON:  None. FINDINGS: Lower chest: Lung bases are clear. Hepatobiliary: No focal hepatic lesion. Postcholecystectomy. No biliary dilatation. Pancreas: Pancreas is normal. No ductal dilatation. No pancreatic inflammation. Spleen: Normal spleen Adrenals/urinary tract: Adrenal glands and kidneys are normal. The ureters and bladder normal. Stomach/Bowel: The stomach duodenum normal. There is a caliber change from the jejunum to the ileum with decompressed ileum; however the jejunum is only minimally distended to 3 cm which is upper limits of normal. No  obstructing lesion identified. No mass lesion identified. No pneumatosis. There is a focus of potential extraluminal gas adjacent to the small bowel (image 84/7). This could represent inflamed small bowel diverticulum. Seen on image 83/7 and image 49/4. Terminal ileum is normal. Appendix not identified. The colon is collapsed. Several diverticula sigmoid colon. Vascular/Lymphatic: Abdominal aorta is normal caliber with atherosclerotic calcification. There is no retroperitoneal or periportal lymphadenopathy. No pelvic lymphadenopathy. The SMA and celiac trunk Sir open. Reproductive: Post hysterectomy Other: No free fluid. Musculoskeletal: No aggressive osseous lesion. IMPRESSION: 1. Small bowel caliber transition from jejunum to the ileum with minimal distention of the proximal small bowel suggest partial or intermittent obstruction. There is a focus of inflammation and potential small bowel inflamed diverticulum centrally in the mesentery of the mid small bowel. No high-grade obstructing lesion identified. No pneumatosis or portal venous gas. The colon is collapsed. 2. Atherosclerotic calcification the aorta and branches. The SMA, celiac trunk and IMA are patent. 3. Postcholecystectomy. Electronically Signed   By: SSuzy BouchardM.D.   On: 11/06/2017 17:19     Assessment/Plan Principal Problem:   Small bowel obstruction (HCC) Active Problems:   Essential hypertension   Anemia   CKD (chronic kidney disease) stage 3, GFR 30-59 ml/min (HCC)    #1 small bowel obstruction: Patient is not having significant vomiting at the moment. I will not insert NG tube right away due to patient's use of anticoagulation. Surgery has been consulted. I'll keep her nothing by mouth with IV fluids. Supportive care only.  #2 symptomatic anemia: Hemoglobin 7.5. Patient's stool studies are positive for a cold blood. Suspect this is secondary to use of anticoagulation with Eliquis. We will consult GI in the morning. In the  meantime I will transfuse 2 units of packed red blood cells. Monitor H&H.  #3 essential hypertension: Patient will be nothing by mouth. Blood pressure appears controlled. When necessary labetalol to control blood pressure  #4  chronic kidney disease stage III: Appears to be at baseline. Continue monitoring.  #5 history of pulmonary embolism: Patient has been taking Eliquis. We will hold it now due to active GI bleed. Once GI has seen the patient we will resume.   DVT prophylaxis: SCD  Code Status: Full  Family Communication: Daughter in room Disposition Plan: Home  Consults called: General Surgery-Called by ER Dr Greer Pickerel  Admission status: Inpatient  Severity of Illness: The appropriate patient status for this patient is INPATIENT. Inpatient status is judged to be reasonable and necessary in order to provide the required intensity of service to ensure the patient's safety. The patient's presenting symptoms, physical exam findings, and initial radiographic and laboratory data in the context of their chronic comorbidities is felt to place them at high risk for further clinical deterioration. Furthermore, it is not anticipated that the patient will be medically stable for discharge from the hospital within 2 midnights of admission. The following factors support the patient status of inpatient.   " The patient's presenting symptoms include abdominal pain and distention. " The worrisome physical exam findings include abdominal distention with no bowel sounds. " The initial radiographic and laboratory data are worrisome because of evidence of small bowel obstruction on CT scan. " The chronic co-morbidities include chronic anticoagulation.   * I certify that at the point of admission it is my clinical judgment that the patient will require inpatient hospital care spanning beyond 2 midnights from the point of admission due to high intensity of service, high risk for further deterioration and high  frequency of surveillance required.Barbette Merino MD [facility] Pager [phone]  If 7PM-7AM, please contact night-coverage [email or web address] Password [email or web address]  11/06/2017, 6:50 PM

## 2017-11-07 ENCOUNTER — Encounter (HOSPITAL_COMMUNITY): Payer: Self-pay

## 2017-11-07 DIAGNOSIS — D500 Iron deficiency anemia secondary to blood loss (chronic): Secondary | ICD-10-CM

## 2017-11-07 DIAGNOSIS — I10 Essential (primary) hypertension: Secondary | ICD-10-CM

## 2017-11-07 DIAGNOSIS — N183 Chronic kidney disease, stage 3 (moderate): Secondary | ICD-10-CM

## 2017-11-07 DIAGNOSIS — I5032 Chronic diastolic (congestive) heart failure: Secondary | ICD-10-CM

## 2017-11-07 DIAGNOSIS — Z7901 Long term (current) use of anticoagulants: Secondary | ICD-10-CM

## 2017-11-07 DIAGNOSIS — Z86711 Personal history of pulmonary embolism: Secondary | ICD-10-CM

## 2017-11-07 DIAGNOSIS — K5700 Diverticulitis of small intestine with perforation and abscess without bleeding: Secondary | ICD-10-CM

## 2017-11-07 LAB — COMPREHENSIVE METABOLIC PANEL
ALK PHOS: 76 U/L (ref 38–126)
ALT: 12 U/L — ABNORMAL LOW (ref 14–54)
ANION GAP: 9 (ref 5–15)
AST: 22 U/L (ref 15–41)
Albumin: 2.8 g/dL — ABNORMAL LOW (ref 3.5–5.0)
BILIRUBIN TOTAL: 1 mg/dL (ref 0.3–1.2)
BUN: 19 mg/dL (ref 6–20)
CALCIUM: 8.6 mg/dL — AB (ref 8.9–10.3)
CO2: 27 mmol/L (ref 22–32)
Chloride: 103 mmol/L (ref 101–111)
Creatinine, Ser: 0.98 mg/dL (ref 0.44–1.00)
GFR calc non Af Amer: 53 mL/min — ABNORMAL LOW (ref >60)
Glucose, Bld: 114 mg/dL — ABNORMAL HIGH (ref 65–99)
Potassium: 3.4 mmol/L — ABNORMAL LOW (ref 3.5–5.1)
SODIUM: 139 mmol/L (ref 135–145)
TOTAL PROTEIN: 5.6 g/dL — AB (ref 6.5–8.1)

## 2017-11-07 LAB — CBC
HCT: 30.7 % — ABNORMAL LOW (ref 36.0–46.0)
HEMOGLOBIN: 8.8 g/dL — AB (ref 12.0–15.0)
MCH: 22.8 pg — ABNORMAL LOW (ref 26.0–34.0)
MCHC: 28.7 g/dL — ABNORMAL LOW (ref 30.0–36.0)
MCV: 79.5 fL (ref 78.0–100.0)
Platelets: 315 10*3/uL (ref 150–400)
RBC: 3.86 MIL/uL — AB (ref 3.87–5.11)
RDW: 17.9 % — ABNORMAL HIGH (ref 11.5–15.5)
WBC: 10.2 10*3/uL (ref 4.0–10.5)

## 2017-11-07 LAB — HEPARIN LEVEL (UNFRACTIONATED): Heparin Unfractionated: 1.78 IU/mL — ABNORMAL HIGH (ref 0.30–0.70)

## 2017-11-07 LAB — APTT
aPTT: 31 seconds (ref 24–36)
aPTT: 45 seconds — ABNORMAL HIGH (ref 24–36)

## 2017-11-07 MED ORDER — PSYLLIUM 95 % PO PACK
1.0000 | PACK | Freq: Every day | ORAL | Status: DC
  Filled 2017-11-07 (×6): qty 1

## 2017-11-07 MED ORDER — METHOCARBAMOL 500 MG PO TABS: 1000.0000 mg | ORAL_TABLET | Freq: Four times a day (QID) | ORAL | Status: DC | PRN

## 2017-11-07 MED ORDER — BISACODYL 10 MG RE SUPP: 10.0000 mg | Freq: Two times a day (BID) | RECTAL | Status: DC | PRN

## 2017-11-07 MED ORDER — METRONIDAZOLE IN NACL 5-0.79 MG/ML-% IV SOLN
500.0000 mg | Freq: Four times a day (QID) | INTRAVENOUS | Status: DC
  Administered 2017-11-07 – 2017-11-08 (×4): 500 mg via INTRAVENOUS
  Filled 2017-11-07 (×5): qty 100

## 2017-11-07 MED ORDER — GABAPENTIN 300 MG PO CAPS
300.0000 mg | ORAL_CAPSULE | Freq: Every day | ORAL | Status: DC
  Administered 2017-11-07 – 2017-11-09 (×3): 300 mg via ORAL
  Filled 2017-11-07 (×3): qty 1

## 2017-11-07 MED ORDER — LACTATED RINGERS IV BOLUS: 1000.0000 mL | Freq: Three times a day (TID) | INTRAVENOUS | Status: DC | PRN

## 2017-11-07 MED ORDER — METHOCARBAMOL 1000 MG/10ML IJ SOLN: 1000.0000 mg | Freq: Four times a day (QID) | INTRAVENOUS | Status: DC | PRN

## 2017-11-07 MED ORDER — PHENOL 1.4 % MT LIQD: 1.0000 | OROMUCOSAL | Status: DC | PRN

## 2017-11-07 MED ORDER — GABAPENTIN 100 MG PO CAPS
200.0000 mg | ORAL_CAPSULE | Freq: Every day | ORAL | Status: DC
  Administered 2017-11-07 – 2017-11-08 (×2): 200 mg via ORAL
  Filled 2017-11-07 (×2): qty 2

## 2017-11-07 MED ORDER — GABAPENTIN 100 MG PO CAPS: 200.0000 mg | ORAL_CAPSULE | Freq: Two times a day (BID) | ORAL | Status: DC

## 2017-11-07 MED ORDER — HYDROCORTISONE 1 % EX CREA
1.0000 "application " | TOPICAL_CREAM | Freq: Three times a day (TID) | CUTANEOUS | Status: DC | PRN
  Administered 2017-11-10: 1 via TOPICAL
  Filled 2017-11-07 (×2): qty 28

## 2017-11-07 MED ORDER — HYDROCORTISONE 2.5 % RE CREA
1.0000 "application " | TOPICAL_CREAM | Freq: Four times a day (QID) | RECTAL | Status: DC | PRN
  Filled 2017-11-07: qty 28.35

## 2017-11-07 MED ORDER — SACCHAROMYCES BOULARDII 250 MG PO CAPS
250.0000 mg | ORAL_CAPSULE | Freq: Two times a day (BID) | ORAL | Status: DC
  Administered 2017-11-07 – 2017-11-11 (×10): 250 mg via ORAL
  Filled 2017-11-07 (×11): qty 1

## 2017-11-07 MED ORDER — MAGIC MOUTHWASH: 15.0000 mL | Freq: Four times a day (QID) | ORAL | Status: DC | PRN

## 2017-11-07 MED ORDER — BLISTEX MEDICATED EX OINT
1.0000 "application " | TOPICAL_OINTMENT | Freq: Two times a day (BID) | CUTANEOUS | Status: DC
  Administered 2017-11-08: 1 via TOPICAL
  Filled 2017-11-07: qty 6.3

## 2017-11-07 MED ORDER — GUAIFENESIN-DM 100-10 MG/5ML PO SYRP: 10.0000 mL | ORAL_SOLUTION | ORAL | Status: DC | PRN

## 2017-11-07 MED ORDER — ALUM & MAG HYDROXIDE-SIMETH 200-200-20 MG/5ML PO SUSP: 30.0000 mL | Freq: Four times a day (QID) | ORAL | Status: DC | PRN

## 2017-11-07 MED ORDER — MENTHOL 3 MG MT LOZG: 1.0000 | LOZENGE | OROMUCOSAL | Status: DC | PRN

## 2017-11-07 MED ORDER — PROCHLORPERAZINE EDISYLATE 10 MG/2ML IJ SOLN: 5.0000 mg | INTRAMUSCULAR | Status: DC | PRN

## 2017-11-07 MED ORDER — POTASSIUM CHLORIDE CRYS ER 20 MEQ PO TBCR
40.0000 meq | EXTENDED_RELEASE_TABLET | Freq: Once | ORAL | Status: AC
  Administered 2017-11-07: 40 meq via ORAL
  Filled 2017-11-07: qty 2

## 2017-11-07 MED ORDER — METHOCARBAMOL 1000 MG/10ML IJ SOLN
500.0000 mg | Freq: Four times a day (QID) | INTRAVENOUS | Status: DC | PRN
  Filled 2017-11-07: qty 5

## 2017-11-07 MED ORDER — HEPARIN (PORCINE) IN NACL 100-0.45 UNIT/ML-% IJ SOLN
1400.0000 [IU]/h | INTRAMUSCULAR | Status: DC
  Administered 2017-11-07: 1400 [IU]/h via INTRAVENOUS
  Administered 2017-11-08: 1600 [IU]/h via INTRAVENOUS
  Administered 2017-11-09: 1450 [IU]/h via INTRAVENOUS
  Administered 2017-11-10 – 2017-11-11 (×2): 1550 [IU]/h via INTRAVENOUS
  Filled 2017-11-07 (×6): qty 250

## 2017-11-07 MED ORDER — ACETAMINOPHEN 325 MG PO TABS
325.0000 mg | ORAL_TABLET | Freq: Four times a day (QID) | ORAL | Status: DC | PRN
  Filled 2017-11-07: qty 2

## 2017-11-07 MED ORDER — SODIUM CHLORIDE 0.9 % IV SOLN
2.0000 g | INTRAVENOUS | Status: DC
  Administered 2017-11-07 – 2017-11-12 (×6): 2 g via INTRAVENOUS
  Filled 2017-11-07 (×6): qty 20

## 2017-11-07 MED ORDER — ACETAMINOPHEN 325 MG PO TABS: 325.0000 mg | ORAL_TABLET | Freq: Four times a day (QID) | ORAL | Status: DC | PRN

## 2017-11-07 MED ORDER — ACETAMINOPHEN 650 MG RE SUPP: 650.0000 mg | Freq: Four times a day (QID) | RECTAL | Status: DC | PRN

## 2017-11-07 NOTE — Progress Notes (Signed)
[place] for Heparin  Indication: h/o extensive bilateral PE  (06/2017) pta Apixaban now on hold.   Allergies  Allergen Reactions  . Codeine Sulfate Shortness Of Breath  . Celecoxib Other (See Comments)    Caused vaginal bleeding  . Erythromycin Base Nausea And Vomiting    Patient Measurements: Height: _0  (157.5 cm) Weight: 261 lb 11 oz (118.7 kg) IBW/kg (Calculated) : 50.1 Heparin Dosing Weight: 79.5 kg  Vital Signs: Temp: 98.8 F (37.1 C) (05/05 2054) Temp Source: Oral (05/05 2054) BP: 128/65 (05/05 2054) Pulse Rate: 78 (05/05 2054)  Labs: Recent Labs    11/06/17 1440 11/07/17 0926 11/07/17 1222 11/07/17 2105  HGB 7.5* 8.8*  --   --   HCT 26.6* 30.7*  --   --   PLT 386 315  --   --   APTT  --   --  31 45*  HEPARINUNFRC  --   --  1.78*  --   CREATININE 1.02* 0.98  --   --     Estimated Creatinine Clearance: 55.1 mL/min (by C-G formula based on SCr of 0.98 mg/dL).   Medical History: Past Medical History:  Diagnosis Date  . Allergy   . Anemia   . Anxiety   . Arthritis   . Bilateral pulmonary embolism ([place]) 06/23/2017  . Chronic diastolic CHF (congestive heart failure) (HCC)    a. Echo 1/16:  mild LVH, EF normal, grade 1 DD, MAC  . Chronic venous insufficiency    chronic LE edema  . Depression   . Fibromyalgia    constant pain  . Hx of cardiac catheterization    a. LHC in [place] "ok" per patient with mild plaque in a single vessel - records not available  . Hx of cardiovascular stress test    a. Nuclear study in 2008 normal  . Hx of colonic polyps   . Hypertension   . Hypertriglyceridemia   . Impaired fasting glucose   . PONV (postoperative nausea and vomiting)   . PUD (peptic ulcer disease)    hx of gastric ulcer  . Pulmonary emboli ([place]) 06/2017  . Vitamin B12 deficiency     Medications:  Medications Prior to Admission  Medication Sig Dispense Refill Last Dose  . acetaminophen (TYLENOL) 500 MG tablet  Take 500 mg by mouth every 6 (six) hours as needed for headache (pain).   11/05/2017 at Unknown time  . apixaban (ELIQUIS) 5 MG TABS tablet Take 1 tablet (5 mg total) by mouth 2 (two) times daily. 30 tablet 0 11/06/2017 at 900  . furosemide (LASIX) 80 MG tablet Take 1 tablet (80 mg total) by mouth daily. (Patient taking differently: Take 160 mg by mouth daily. ) 30 tablet 6 couple days ago  . gabapentin (NEURONTIN) 100 MG capsule Take 100-200 mg by mouth See admin instructions. Tapering up to 300 mg three times daily: 5/4, 5/5, 5/6 - take one 300 mg capsule every morning and two capsules (200 mg)  at bedtime; 5/7, 5/8, 5/9 take one 300 mg capsule twice daily, 5/10, 5/11, 5/12, take one 300 mg capsule every morning and one 300 mg capsule with one capsule (100 mg) at bedtime, 5/13, 5/14, 5/15 - take 800 mg during the day (unknown directions), 5/16 and going forward take one 300 mg capsule three times daily   11/05/2017 at pm  . gabapentin (NEURONTIN) 300 MG capsule Take 1 capsule (300 mg total) by mouth 3 (three) times daily. (Patient  taking differently: Take 300 mg by mouth See admin instructions. Tapering up to 300 mg three times daily: 5/4, 5/5, 5/6 - take one capsule (300 mg) every morning and two 100 mg capsules at bedtime; 5/7, 5/8, 5/9 take one capsule (300 mg) twice daily, 5/10, 5/11, 5/12, take one capsule (300 mg) every morning and one capsule (300 mg) with a 100 mg capsule at bedtime, 5/13, 5/14, 5/15 - take 800 mg during the day (unknown directions), 5/16 and going forward take one capsule (300 mg) three times daily) 270 capsule 3 11/06/2017 at am  . potassium chloride SA (K-DUR,KLOR-CON) 20 MEQ tablet TAKE 2 TABLETS (40 MEQ TOTAL) DAILY 180 tablet 3 11/05/2017 at am  . triamterene-hydrochlorothiazide (MAXZIDE-25) 37.5-25 MG tablet Take 1 tablet by mouth daily. 90 tablet 3 11/05/2017 at am  . acetaminophen (TYLENOL) 325 MG tablet Take 2 tablets (650 mg total) by mouth every 6 (six) hours as needed for mild  pain. (Patient not taking: Reported on 11/06/2017)   Not Taking at Unknown time   Scheduled:  . gabapentin  200 mg Oral QHS   And  . gabapentin  300 mg Oral Daily  . lip balm  1 application Topical BID  . psyllium  1 packet Oral Daily  . saccharomyces boulardii  250 mg Oral BID    Assessment: 83 y.o female on Eliquis pta for h/o PE (06/2017, extensive bilateral PE without right heart strain per CTA).    Also with FOB positive. Holding Eliquis. Last Eliquis dose 5/4 at 9am.   She is noted with diverticulitis of small intestine with microperforation. Possible elective surgery. Pharmacy dose heparin -aPTT = 45  Goal of Therapy:  aPTT 66-102 seconds  Heparin level 0.3-0.7 units/ml Monitor platelets by anticoagulation protocol: Yes   Plan:  -increase heparin to 1600 units/hr -aPTT in 8 hrs -daily aPTT, heparin level and CBC  Hildred Laser, PharmD Clinical Pharmacist 11/07/2017 9:56 PM

## 2017-11-07 NOTE — Progress Notes (Signed)
[address] [place], [place] [address] Phone: [phone]  FAX: [phone]      Chelsea Keller [id number] 1934-08-06  CARE TEAM:  PCP: Venia Carbon, MD  Outpatient Care Team: Patient Care Team: Venia Carbon, MD as PCP - General Angelena Form, Annita Brod, MD as PCP - Cardiology (Cardiology) Liliane Shi, PA-C as Physician Assistant (Physician Assistant)  Inpatient Treatment Team: Treatment Team: Attending Provider: Modena Jansky, MD; Rounding Team: Edison Pace, Md, MD; Technician: Humberto Leep, NT; Rounding Team: Sonda Primes, MD; Registered Nurse: Ilda Mori I, RN   Problem List:   Principal Problem:   Diverticulitis of small intestine with perforation Active Problems:   History of pulmonary embolism   Chronic anticoagulation   Essential hypertension   BMI 50.0-59.9, adult ([place])   Chronic diastolic congestive heart failure (HCC)   Anemia   CKD (chronic kidney disease) stage 3, GFR 30-59 ml/min (HCC)   Small bowel obstruction (HCC)      * No surgery found *     Assessment  Improved  Plan:  -IV ABx - cefritaxone/flagyl -try clears PO - NPO if worse - NGT if even more worse -Xrays in AM - ADAT in WNL & tol clears -CTscan in 4-5 days if not better to r/o abscess  If truly jejunal diverticulitis, high likelihood of recurrence or progression & will need SB resection.  Try to cool off w IV ABx first & consider election resection MIS/lap when risks of surgery minimized  -VTE prophylaxis- SCDs, etc -hold anticoagulation -mobilize as tolerated to help recovery  20 minutes spent in review, evaluation, examination, counseling, and coordination of care.  More than 50% of that time was spent in counseling.  Adin Hector, M.D., F.A.C.S. Gastrointestinal and Minimally Invasive Surgery [facility] [address] [place], [place] [address] [phone] Main  / Paging   11/07/2017    Subjective: (Chief complaint)  Sore but better No n/v Wants to eat  Objective:  Vital signs:  Vitals:   11/07/17 0433 11/07/17 0501 11/07/17 0730 11/07/17 0758  BP: 133/65 120/60 (!) 144/76   Pulse: 79 75    Resp: '12 14 14   '$ Temp: 97.6 F (36.4 C) 97.9 F (36.6 C)  98 F (36.7 C)  TempSrc: Oral Oral Oral Oral  SpO2: 94% 94% 96%   Weight:      Height:           Intake/Output   Yesterday:  05/04 0701 - 05/05 0700 In: 1751 [I.V.:1000; Blood:315; IV Piggyback:100] Out: -  This shift:  Total I/O In: 36 [Blood:308] Out: -   Bowel function:  Flatus: YES  BM:  No  Drain: (No drain)   Physical Exam:  General: Pt awake/alert/oriented x4 in no acute distress Eyes: PERRL, normal EOM.  Sclera clear.  No icterus Neuro: CN II-XII intact w/o focal sensory/motor deficits. Lymph: No head/neck/groin lymphadenopathy Psych:  No delerium/psychosis/paranoia HENT: Normocephalic, Mucus membranes moist.  No thrush Neck: Supple, No tracheal deviation Chest: No chest wall pain w good excursion CV:  Pulses intact.  Regular rhythm MS: Normal AROM mjr joints.  No obvious deformity  Abdomen: OBESE.  Soft.  Nondistended.  Nontender.  No evidence of peritonitis.  No incarcerated hernias.  Ext:  No deformity.  No mjr edema.  No cyanosis Skin: No petechiae / purpura  Results:   Labs: Results for orders placed or performed during the hospital encounter of 11/06/17 (  from the past 48 hour(s))  Brain natriuretic peptide     Status: Abnormal   Collection Time: 11/06/17  2:36 PM  Result Value Ref Range   B Natriuretic Peptide 120.9 (H) 0.0 - 100.0 pg/mL    Comment: Performed at [facility] [address] [place], [place] [address]  Basic metabolic panel     Status: Abnormal   Collection Time: 11/06/17  2:40 PM  Result Value Ref Range   Sodium 138 135 - 145 mmol/L   Potassium 3.4 (L) 3.5 - 5.1 mmol/L   Chloride 97 (L) 101 - 111 mmol/L   CO2  28 22 - 32 mmol/L   Glucose, Bld 137 (H) 65 - 99 mg/dL   BUN 27 (H) 6 - 20 mg/dL   Creatinine, Ser 1.02 (H) 0.44 - 1.00 mg/dL   Calcium 9.1 8.9 - 10.3 mg/dL   GFR calc non Af Amer 50 (L) >60 mL/min   GFR calc Af Amer 58 (L) >60 mL/min    Comment: (NOTE) The eGFR has been calculated using the CKD EPI equation. This calculation has not been validated in all clinical situations. eGFR's persistently <60 mL/min signify possible Chronic Kidney Disease.    Anion gap 13 5 - 15    Comment: Performed at [facility] [address] [place], [place] [address]  CBC     Status: Abnormal   Collection Time: 11/06/17  2:40 PM  Result Value Ref Range   WBC 9.7 4.0 - 10.5 K/uL   RBC 3.51 (L) 3.87 - 5.11 MIL/uL   Hemoglobin 7.5 (L) 12.0 - 15.0 g/dL   HCT 26.6 (L) 36.0 - 46.0 %   MCV 75.8 (L) 78.0 - 100.0 fL   MCH 21.4 (L) 26.0 - 34.0 pg   MCHC 28.2 (L) 30.0 - 36.0 g/dL   RDW 17.8 (H) 11.5 - 15.5 %   Platelets 386 150 - 400 K/uL    Comment: Performed at [facility], [place] [address] [place], [place] [address]  I-stat troponin, ED     Status: None   Collection Time: 11/06/17  2:50 PM  Result Value Ref Range   Troponin i, poc 0.02 0.00 - 0.08 ng/mL   Comment 3            Comment: Due to the release kinetics of cTnI, a negative result within the first hours of the onset of symptoms does not rule out myocardial infarction with certainty. If myocardial infarction is still suspected, repeat the test at appropriate intervals.   Hepatic function panel     Status: Abnormal   Collection Time: 11/06/17  4:27 PM  Result Value Ref Range   Total Protein 6.2 (L) 6.5 - 8.1 g/dL   Albumin 3.1 (L) 3.5 - 5.0 g/dL   AST 18 15 - 41 U/L   ALT 11 (L) 14 - 54 U/L   Alkaline Phosphatase 77 38 - 126 U/L   Total Bilirubin 0.9 0.3 - 1.2 mg/dL   Bilirubin, Direct 0.2 0.1 - 0.5 mg/dL   Indirect Bilirubin 0.7 0.3 - 0.9 mg/dL    Comment: Performed at [facility] [address] [place],  [place] [address]  Lipase, blood     Status: None   Collection Time: 11/06/17  4:27 PM  Result Value Ref Range   Lipase 43 11 - 51 U/L    Comment: Performed at [facility], [place] [address], [place], [place] [address]  Type  and screen [place]     Status: None (Preliminary result)   Collection Time: 11/06/17  4:27 PM  Result Value Ref Range   ABO/RH(D) B POS    Antibody Screen NEG    Sample Expiration 11/09/2017    Unit Number [id number]    Blood Component Type RBC LR PHER1    Unit division 00    Status of Unit ISSUED    Transfusion Status OK TO TRANSFUSE    Crossmatch Result      Compatible Performed at [facility], [place] [address] [place], [place] [address]    Unit Number [id number]    Blood Component Type RED CELLS,LR    Unit division 00    Status of Unit ISSUED    Transfusion Status OK TO TRANSFUSE    Crossmatch Result Compatible   Vitamin B12     Status: None   Collection Time: 11/06/17  4:27 PM  Result Value Ref Range   Vitamin B-12 529 180 - 914 pg/mL    Comment: (NOTE) This assay is not validated for testing neonatal or myeloproliferative syndrome specimens for Vitamin B12 levels. Performed at [facility], [place] [address], [place], [place] [address]   Folate     Status: None   Collection Time: 11/06/17  4:27 PM  Result Value Ref Range   Folate 10.1 >5.9 ng/mL    Comment: Performed at [facility], [place] [address], [place], [place] [address]  Iron and TIBC     Status: Abnormal   Collection Time: 11/06/17  4:27 PM  Result Value Ref Range   Iron 22 (L) 28 - 170 ug/dL   TIBC 406 250 - 450 ug/dL   Saturation Ratios 5 (L) 10.4 - 31.8 %   UIBC 384 ug/dL    Comment: Performed at [facility], [place] [address] [place], [place] [address]  Ferritin     Status: Abnormal   Collection Time: 11/06/17  4:27 PM  Result Value Ref Range   Ferritin 6 (L) 11 - 307 ng/mL    Comment: Performed at [facility], [place] [address], [place], [place] [address]  Reticulocytes     Status: Abnormal   Collection Time: 11/06/17  4:27 PM  Result Value Ref Range   Retic Ct Pct 3.3 (H) 0.4 - 3.1 %   RBC. 3.45 (L) 3.87 - 5.11 MIL/uL   Retic Count, Absolute 113.9 19.0 - 186.0 K/uL    Comment: Performed at [facility] [address] [place], [place] [address]  POC occult blood, ED     Status: Abnormal   Collection Time: 11/06/17  6:35 PM  Result Value Ref Range   Fecal Occult Bld POSITIVE (A) NEGATIVE  Prepare RBC     Status: None   Collection Time: 11/06/17  9:14 PM  Result Value Ref Range   Order Confirmation      ORDER PROCESSED BY BLOOD BANK Performed at [facility], [place] [address] [place], [place] [address]     Imaging / Studies: Dg Chest 2 View  Result Date: 11/06/2017 CLINICAL DATA:  Central chest pain. Abdominal pain since 4 a.m. Tingling in the hands. EXAM: CHEST - 2 VIEW COMPARISON:  None. FINDINGS: Heart is enlarged. There is no edema or effusion to suggest failure. Aortic atherosclerotic changes are present. Degenerative changes are again noted in the thoracic spine. Vertebral body heights are maintained. Surgical clips are present at  the gallbladder fossa. IMPRESSION: 1. Stable cardiomegaly without failure. 2. Aortic atherosclerosis. 3. No acute cardiopulmonary disease. Electronically Signed   By: San Morelle M.D.   On: 11/06/2017 15:37   Ct Abdomen Pelvis W Contrast  Result Date: 11/06/2017 CLINICAL DATA:  General abdominal pain radiates into chest. EXAM: CT ABDOMEN AND PELVIS WITH CONTRAST TECHNIQUE: Multidetector CT imaging of the abdomen and pelvis was performed using the standard protocol following bolus administration of intravenous contrast. CONTRAST:  130m OMNIPAQUE IOHEXOL 300 MG/ML  SOLN COMPARISON:  None. FINDINGS: Lower chest: Lung bases are clear. Hepatobiliary: No focal hepatic lesion. Postcholecystectomy. No biliary dilatation. Pancreas: Pancreas is normal. No ductal  dilatation. No pancreatic inflammation. Spleen: Normal spleen Adrenals/urinary tract: Adrenal glands and kidneys are normal. The ureters and bladder normal. Stomach/Bowel: The stomach duodenum normal. There is a caliber change from the jejunum to the ileum with decompressed ileum; however the jejunum is only minimally distended to 3 cm which is upper limits of normal. No obstructing lesion identified. No mass lesion identified. No pneumatosis. There is a focus of potential extraluminal gas adjacent to the small bowel (image 84/7). This could represent inflamed small bowel diverticulum. Seen on image 83/7 and image 49/4. Terminal ileum is normal. Appendix not identified. The colon is collapsed. Several diverticula sigmoid colon. Vascular/Lymphatic: Abdominal aorta is normal caliber with atherosclerotic calcification. There is no retroperitoneal or periportal lymphadenopathy. No pelvic lymphadenopathy. The SMA and celiac trunk Sir open. Reproductive: Post hysterectomy Other: No free fluid. Musculoskeletal: No aggressive osseous lesion. IMPRESSION: 1. Small bowel caliber transition from jejunum to the ileum with minimal distention of the proximal small bowel suggest partial or intermittent obstruction. There is a focus of inflammation and potential small bowel inflamed diverticulum centrally in the mesentery of the mid small bowel. No high-grade obstructing lesion identified. No pneumatosis or portal venous gas. The colon is collapsed. 2. Atherosclerotic calcification the aorta and branches. The SMA, celiac trunk and IMA are patent. 3. Postcholecystectomy. Electronically Signed   By: SSuzy BouchardM.D.   On: 11/06/2017 17:19    Medications / Allergies: per chart  Antibiotics: Anti-infectives (From admission, onward)   None        Note: Portions of this report may have been transcribed using voice recognition software. Every effort was made to ensure accuracy; however, inadvertent computerized  transcription errors may be present.   Any transcriptional errors that result from this process are unintentional.     SAdin Hector M.D., F.A.C.S. Gastrointestinal and Minimally Invasive Surgery [facility] [address] [place] [phone] / Paging   11/07/2017

## 2017-11-07 NOTE — Progress Notes (Addendum)
ANTICOAGULATION CONSULT NOTE - Initial Consult  Pharmacy Consult for Heparin  Indication: h/o extensive bilateral PE  (06/2017) pta Apixaban now on hold.   Allergies  Allergen Reactions  . Codeine Sulfate Shortness Of Breath  . Celecoxib Other (See Comments)    Caused vaginal bleeding  . Erythromycin Base Nausea And Vomiting    Patient Measurements: Height: 5' 2"  (157.5 cm) Weight: 261 lb 11 oz (118.7 kg) IBW/kg (Calculated) : 50.1 Heparin Dosing Weight: 79.5 kg  Vital Signs: Temp: 98 F (36.7 C) (05/05 0758) Temp Source: Oral (05/05 0758) BP: 144/76 (05/05 0730) Pulse Rate: 75 (05/05 0501)  Labs: Recent Labs    11/06/17 1440 11/07/17 0926  HGB 7.5* 8.8*  HCT 26.6* 30.7*  PLT 386 315  CREATININE 1.02* 0.98    Estimated Creatinine Clearance: 55.1 mL/min (by C-G formula based on SCr of 0.98 mg/dL).   Medical History: Past Medical History:  Diagnosis Date  . Allergy   . Anemia   . Anxiety   . Arthritis   . Bilateral pulmonary embolism ([place]) 06/23/2017  . Chronic diastolic CHF (congestive heart failure) (HCC)    a. Echo 1/16:  mild LVH, EF normal, grade 1 DD, MAC  . Chronic venous insufficiency    chronic LE edema  . Depression   . Fibromyalgia    constant pain  . Hx of cardiac catheterization    a. LHC in [place] "ok" per patient with mild plaque in a single vessel - records not available  . Hx of cardiovascular stress test    a. Nuclear study in 2008 normal  . Hx of colonic polyps   . Hypertension   . Hypertriglyceridemia   . Impaired fasting glucose   . PONV (postoperative nausea and vomiting)   . PUD (peptic ulcer disease)    hx of gastric ulcer  . Pulmonary emboli ([place]) 06/2017  . Vitamin B12 deficiency     Medications:  Medications Prior to Admission  Medication Sig Dispense Refill Last Dose  . acetaminophen (TYLENOL) 500 MG tablet Take 500 mg by mouth every 6 (six) hours as needed for headache (pain).   11/05/2017 at Unknown time  . apixaban  (ELIQUIS) 5 MG TABS tablet Take 1 tablet (5 mg total) by mouth 2 (two) times daily. 30 tablet 0 11/06/2017 at 900  . furosemide (LASIX) 80 MG tablet Take 1 tablet (80 mg total) by mouth daily. (Patient taking differently: Take 160 mg by mouth daily. ) 30 tablet 6 couple days ago  . gabapentin (NEURONTIN) 100 MG capsule Take 100-200 mg by mouth See admin instructions. Tapering up to 300 mg three times daily: 5/4, 5/5, 5/6 - take one 300 mg capsule every morning and two capsules (200 mg)  at bedtime; 5/7, 5/8, 5/9 take one 300 mg capsule twice daily, 5/10, 5/11, 5/12, take one 300 mg capsule every morning and one 300 mg capsule with one capsule (100 mg) at bedtime, 5/13, 5/14, 5/15 - take 800 mg during the day (unknown directions), 5/16 and going forward take one 300 mg capsule three times daily   11/05/2017 at pm  . gabapentin (NEURONTIN) 300 MG capsule Take 1 capsule (300 mg total) by mouth 3 (three) times daily. (Patient taking differently: Take 300 mg by mouth See admin instructions. Tapering up to 300 mg three times daily: 5/4, 5/5, 5/6 - take one capsule (300 mg) every morning and two 100 mg capsules at bedtime; 5/7, 5/8, 5/9 take one capsule (300 mg) twice daily, 5/10, 5/11,  5/12, take one capsule (300 mg) every morning and one capsule (300 mg) with a 100 mg capsule at bedtime, 5/13, 5/14, 5/15 - take 800 mg during the day (unknown directions), 5/16 and going forward take one capsule (300 mg) three times daily) 270 capsule 3 11/06/2017 at am  . potassium chloride SA (K-DUR,KLOR-CON) 20 MEQ tablet TAKE 2 TABLETS (40 MEQ TOTAL) DAILY 180 tablet 3 11/05/2017 at am  . triamterene-hydrochlorothiazide (MAXZIDE-25) 37.5-25 MG tablet Take 1 tablet by mouth daily. 90 tablet 3 11/05/2017 at am  . acetaminophen (TYLENOL) 325 MG tablet Take 2 tablets (650 mg total) by mouth every 6 (six) hours as needed for mild pain. (Patient not taking: Reported on 11/06/2017)   Not Taking at Unknown time   Scheduled:  . gabapentin  200 mg  Oral BID  . lip balm  1 application Topical BID  . potassium chloride  40 mEq Oral Once  . psyllium  1 packet Oral Daily  . saccharomyces boulardii  250 mg Oral BID    Assessment: 83 y.o female on Eliquis pta for h/o PE (06/2017, extensive bilateral PE without right heart strain per CTA).    Also with FOB positive. Holding Eliquis. Last Eliquis dose 5/4 at 9am; (MD noted LD 5/3, ?)  SCDs on.  GI consulted. Diverticulitis of small intestine with microperforation and PSBO.  Plan for consevative management with bowel rest/clear liquids, IV antibiotics. If not better, elective surgery. GI okay with starting IV heparin for h/o bilateral  PE.  Pharmacy consulted to  start IV heparin for hx bilateral PE.  Patient is morbidly obese.  Hgb 7.5>8.8 post  transfusion PRBCs. Pltc wnl.  H/o CKD stage 3, SCr 0.98 -no overt bleeding at this time.  Goal of Therapy:  aPTT 66-102 seconds  Heparin level 0.3-0.7 units/ml Monitor platelets by anticoagulation protocol: Yes   Plan:  STAT aPTT and heparin level baseline  Monitor heparin using aPTTs at first due to on recent Eliquis Start IV heparin drip at 1400 units/hr  8 hour aPTT Daily aPTT, Heparin level  Nicole Cella, RPh Clinical Pharmacist Pager: [phone] 11/07/2017,12:14 PM

## 2017-11-07 NOTE — Progress Notes (Addendum)
PROGRESS NOTE   Chelsea Keller  LGX:[id number]    DOB: 10-14-1934    DOA: 11/06/2017  PCP: Venia Carbon, MD   I have briefly reviewed patients previous medical records in [facility].  Brief Narrative:  83 year old female, lives with her daughter and 3 adult grand sons, independent, PMH of acute PE (extensive bilateral PE without right heart strain by CTA chest 06/23/2017) on Eliquis since, chronic diastolic CHF, chronic lower extremity venous insufficiency/edema, anxiety, depression, HTN, PUD, B12 deficiency, tented to [facility] ED on 5/4 with nausea and abdominal pain.  CT abdomen 5/4 suggestive of PSBO.  General surgery consulted and suspect jejunal diverticulitis with microperforation and PSBO.    Assessment & Plan:   Principal Problem:   Diverticulitis of small intestine with perforation Active Problems:   Essential hypertension   BMI 50.0-59.9, adult (HCC)   Chronic diastolic congestive heart failure (HCC)   Anemia   CKD (chronic kidney disease) stage 3, GFR 30-59 ml/min (HCC)   Small bowel obstruction (HCC)   History of pulmonary embolism   Chronic anticoagulation   Diverticulitis of small intestine with microperforation and PSBO: General surgery consultation and follow-up appreciated.  Discussed with Dr. Johney Maine 5/5.  Continue conservative management with bowel rest/clear liquids, IV ceftriaxone + Flagyl, serial x-rays, CT abdomen in 4-5 days if not better to rule out abscess, try for elective surgery.  Dr. Johney Maine okay with starting IV heparin for PE.  Clinically better.  History of bilateral pulmonary embolism: Diagnosed mid December 2018.  Felt to be unprovoked but patient has sedentary lifestyle.  No DVT or right heart strain on echo at that time.  Has been on Eliquis since (still <6 months).  Eliquis held (last dose 11/05/2017) due to presenting diagnosis.  Discussed with general surgery and okay to use IV heparin which can be promptly discontinued if has bleeding complications or  if surgery warranted.  As per extensive discussion with patient, no overt bleeding at this time.  Iron deficiency anemia:?  Related to slow chronic GI blood loss versus others.  Hemoglobin 12/24: 11.5 which may be her baseline.  Presented with hemoglobin of 7.5 in the absence of overt bleeding.  Patient clarifies that she did not have melena or blood in her stools.  FOBT +.  Transfused 2 units of PRBC on admission and hemoglobin up to 8.8.  [facility]/Dr. Fuller Plan did EGD 10/15/2014, unremarkable except for mild gastritis and colonoscopy: Multiple polyps removed, diverticulosis and internal hemorrhoids.  Iron supplements when GI issues improved versus consider IV iron replacement.  Monitor closely while on IV heparin infusion.  No clear indication for GI consultation at this time and may also not want to intervene currently given microperforation.  Essential hypertension: Controlled.  Hold thiazide diuretics for now.  Chronic diastolic CHF: Judicious and gentle hydration.  Currently appears compensated.  Hypokalemia: Replace and follow.  History of PUD: Continue Pepcid.  Stage III chronic kidney disease: Stable.  Chronic lower extremity venous insufficiency/edema: Not much edema at this time.  Start low-dose gabapentin which patient had just started and was tapering up.  Anxiety & depression: Stable.  Morbid obesity/Body mass index is 47.86 kg/m.    DVT prophylaxis: IV heparin infusion per pharmacy.  Eliquis held. Code Status: Full Family Communication: None at bedside. Disposition: DC home pending medical improvement   Consultants:  General surgery  Procedures:  None  Antimicrobials:  IV ceftriaxone + Flagyl.   Subjective: Seen this morning.  States that she feels better.  Denies abdominal pain after she received a dose of morphine in ED.  No nausea or vomiting.  Passing small flatus.  Last BM was on 11/05/2017.  Clarifies that she did not have black tarry stools or blood in the  stools.  She reports dark brown stools.  No chest pain, dyspnea, palpitations or dizziness.  ROS: As above.  Objective:  Vitals:   11/07/17 0433 11/07/17 0501 11/07/17 0730 11/07/17 0758  BP: 133/65 120/60 (!) 144/76   Pulse: 79 75    Resp: 12 14 14    Temp: 97.6 F (36.4 C) 97.9 F (36.6 C)  98 F (36.7 C)  TempSrc: Oral Oral Oral Oral  SpO2: 94% 94% 96%   Weight:      Height:        Examination:  General exam: Pleasant elderly female, moderately built and obese, lying comfortably supine in bed.  Oral mucosa moist. Respiratory system: Clear to auscultation. Respiratory effort normal. Cardiovascular system: S1 & S2 heard, RRR. No JVD, murmurs, rubs, gallops or clicks. No pedal edema.  Telemetry personally reviewed and shows sinus rhythm with occasional PVCs. Gastrointestinal system: Abdomen is nondistended but obese, soft and nontender. No organomegaly or masses felt. Normal bowel sounds heard.  Right upper quadrant laparotomy scar. Central nervous system: Alert and oriented. No focal neurological deficits. Extremities: Symmetric 5 x 5 power. Skin: No rashes, lesions or ulcers Psychiatry: Judgement and insight appear normal. Mood & affect appropriate.     Data Reviewed: I have personally reviewed following labs and imaging studies  CBC: Recent Labs  Lab 11/06/17 1440 11/07/17 0926  WBC 9.7 10.2  HGB 7.5* 8.8*  HCT 26.6* 30.7*  MCV 75.8* 79.5  PLT 386 557   Basic Metabolic Panel: Recent Labs  Lab 11/06/17 1440 11/07/17 0926  NA 138 139  K 3.4* 3.4*  CL 97* 103  CO2 28 27  GLUCOSE 137* 114*  BUN 27* 19  CREATININE 1.02* 0.98  CALCIUM 9.1 8.6*   Liver Function Tests: Recent Labs  Lab 11/06/17 1627 11/07/17 0926  AST 18 22  ALT 11* 12*  ALKPHOS 77 76  BILITOT 0.9 1.0  PROT 6.2* 5.6*  ALBUMIN 3.1* 2.8*      Radiology Studies: Dg Chest 2 View  Result Date: 11/06/2017 CLINICAL DATA:  Central chest pain. Abdominal pain since 4 a.m. Tingling in the  hands. EXAM: CHEST - 2 VIEW COMPARISON:  None. FINDINGS: Heart is enlarged. There is no edema or effusion to suggest failure. Aortic atherosclerotic changes are present. Degenerative changes are again noted in the thoracic spine. Vertebral body heights are maintained. Surgical clips are present at the gallbladder fossa. IMPRESSION: 1. Stable cardiomegaly without failure. 2. Aortic atherosclerosis. 3. No acute cardiopulmonary disease. Electronically Signed   By: San Morelle M.D.   On: 11/06/2017 15:37   Ct Abdomen Pelvis W Contrast  Result Date: 11/06/2017 CLINICAL DATA:  General abdominal pain radiates into chest. EXAM: CT ABDOMEN AND PELVIS WITH CONTRAST TECHNIQUE: Multidetector CT imaging of the abdomen and pelvis was performed using the standard protocol following bolus administration of intravenous contrast. CONTRAST:  151mL OMNIPAQUE IOHEXOL 300 MG/ML  SOLN COMPARISON:  None. FINDINGS: Lower chest: Lung bases are clear. Hepatobiliary: No focal hepatic lesion. Postcholecystectomy. No biliary dilatation. Pancreas: Pancreas is normal. No ductal dilatation. No pancreatic inflammation. Spleen: Normal spleen Adrenals/urinary tract: Adrenal glands and kidneys are normal. The ureters and bladder normal. Stomach/Bowel: The stomach duodenum normal. There is a caliber change from the jejunum to the  ileum with decompressed ileum; however the jejunum is only minimally distended to 3 cm which is upper limits of normal. No obstructing lesion identified. No mass lesion identified. No pneumatosis. There is a focus of potential extraluminal gas adjacent to the small bowel (image 84/7). This could represent inflamed small bowel diverticulum. Seen on image 83/7 and image 49/4. Terminal ileum is normal. Appendix not identified. The colon is collapsed. Several diverticula sigmoid colon. Vascular/Lymphatic: Abdominal aorta is normal caliber with atherosclerotic calcification. There is no retroperitoneal or periportal  lymphadenopathy. No pelvic lymphadenopathy. The SMA and celiac trunk Sir open. Reproductive: Post hysterectomy Other: No free fluid. Musculoskeletal: No aggressive osseous lesion. IMPRESSION: 1. Small bowel caliber transition from jejunum to the ileum with minimal distention of the proximal small bowel suggest partial or intermittent obstruction. There is a focus of inflammation and potential small bowel inflamed diverticulum centrally in the mesentery of the mid small bowel. No high-grade obstructing lesion identified. No pneumatosis or portal venous gas. The colon is collapsed. 2. Atherosclerotic calcification the aorta and branches. The SMA, celiac trunk and IMA are patent. 3. Postcholecystectomy. Electronically Signed   By: Suzy Bouchard M.D.   On: 11/06/2017 17:19        Scheduled Meds: . lip balm  1 application Topical BID  . psyllium  1 packet Oral Daily  . saccharomyces boulardii  250 mg Oral BID   Continuous Infusions: . cefTRIAXone (ROCEPHIN)  IV    . dextrose 5 % and 0.9% NaCl 50 mL/hr at 11/07/17 1002  . famotidine (PEPCID) IV Stopped (11/07/17 1034)  . lactated ringers    . methocarbamol (ROBAXIN)  IV    . metronidazole Stopped (11/07/17 1104)     LOS: 1 day     Vernell Leep, MD, FACP, [facility]. [facility] Pager [phone]  If 7PM-7AM, please contact night-coverage [email or web address] Password [facility] 11/07/2017, 11:37 AM

## 2017-11-08 ENCOUNTER — Inpatient Hospital Stay (HOSPITAL_COMMUNITY): Payer: Medicare Other

## 2017-11-08 DIAGNOSIS — D649 Anemia, unspecified: Secondary | ICD-10-CM

## 2017-11-08 LAB — CBC
HCT: 29 % — ABNORMAL LOW (ref 36.0–46.0)
Hemoglobin: 8.4 g/dL — ABNORMAL LOW (ref 12.0–15.0)
MCH: 22.7 pg — AB (ref 26.0–34.0)
MCHC: 29 g/dL — AB (ref 30.0–36.0)
MCV: 78.4 fL (ref 78.0–100.0)
PLATELETS: 315 10*3/uL (ref 150–400)
RBC: 3.7 MIL/uL — AB (ref 3.87–5.11)
RDW: 18.3 % — ABNORMAL HIGH (ref 11.5–15.5)
WBC: 11.6 10*3/uL — ABNORMAL HIGH (ref 4.0–10.5)

## 2017-11-08 LAB — BASIC METABOLIC PANEL
ANION GAP: 8 (ref 5–15)
BUN: 15 mg/dL (ref 6–20)
CALCIUM: 8.5 mg/dL — AB (ref 8.9–10.3)
CO2: 26 mmol/L (ref 22–32)
Chloride: 103 mmol/L (ref 101–111)
Creatinine, Ser: 0.99 mg/dL (ref 0.44–1.00)
GFR calc Af Amer: >60 mL/min (ref >60)
GFR, EST NON AFRICAN AMERICAN: 52 mL/min — AB (ref >60)
GLUCOSE: 106 mg/dL — AB (ref 65–99)
Potassium: 3.7 mmol/L (ref 3.5–5.1)
SODIUM: 137 mmol/L (ref 135–145)

## 2017-11-08 LAB — TYPE AND SCREEN
ABO/RH(D): B POS
ANTIBODY SCREEN: NEGATIVE
UNIT DIVISION: 0
Unit division: 0

## 2017-11-08 LAB — BPAM RBC
BLOOD PRODUCT EXPIRATION DATE: 201906012359
Blood Product Expiration Date: 201906012359
ISSUE DATE / TIME: 201905050440
ISSUE DATE / TIME: 201905050010
UNIT TYPE AND RH: 7300
Unit Type and Rh: 7300

## 2017-11-08 LAB — HEPARIN LEVEL (UNFRACTIONATED): Heparin Unfractionated: 1.26 IU/mL — ABNORMAL HIGH (ref 0.30–0.70)

## 2017-11-08 LAB — APTT: APTT: 78 s — AB (ref 24–36)

## 2017-11-08 LAB — MAGNESIUM: MAGNESIUM: 1.6 mg/dL — AB (ref 1.7–2.4)

## 2017-11-08 MED ORDER — BOOST / RESOURCE BREEZE PO LIQD CUSTOM
1.0000 | Freq: Two times a day (BID) | ORAL | Status: DC
  Administered 2017-11-08 – 2017-11-09 (×2): 1 via ORAL

## 2017-11-08 MED ORDER — MAGNESIUM SULFATE 2 GM/50ML IV SOLN
2.0000 g | Freq: Once | INTRAVENOUS | Status: AC
  Administered 2017-11-08: 2 g via INTRAVENOUS
  Filled 2017-11-08: qty 50

## 2017-11-08 MED ORDER — METRONIDAZOLE IN NACL 5-0.79 MG/ML-% IV SOLN
500.0000 mg | Freq: Three times a day (TID) | INTRAVENOUS | Status: DC
  Administered 2017-11-08 – 2017-11-12 (×12): 500 mg via INTRAVENOUS
  Filled 2017-11-08 (×12): qty 100

## 2017-11-08 NOTE — Progress Notes (Signed)
Pt walked to door and back in room. Stated that was as far as she could go. Complained of chronic hip and ankle pain.

## 2017-11-08 NOTE — Progress Notes (Signed)
[place] for Heparin  Indication: h/o extensive bilateral PE  (06/2017) pta Apixaban now on hold.   Allergies  Allergen Reactions  . Codeine Sulfate Shortness Of Breath  . Celecoxib Other (See Comments)    Caused vaginal bleeding  . Erythromycin Base Nausea And Vomiting    Patient Measurements: Height: 5' 2"  (157.5 cm) Weight: 261 lb 11 oz (118.7 kg) IBW/kg (Calculated) : 50.1 Heparin Dosing Weight: 79.5 kg  Vital Signs: Temp: 99 F (37.2 C) (05/06 0500) Temp Source: Oral (05/06 0500) BP: 112/64 (05/06 0500) Pulse Rate: 85 (05/06 0500)  Labs: Recent Labs    11/06/17 1440 11/07/17 0926 11/07/17 1222 11/07/17 2105 11/08/17 0637  HGB 7.5* 8.8*  --   --  8.4*  HCT 26.6* 30.7*  --   --  29.0*  PLT 386 315  --   --  315  APTT  --   --  31 45* 78*  HEPARINUNFRC  --   --  1.78*  --  1.26*  CREATININE 1.02* 0.98  --   --  0.99    Estimated Creatinine Clearance: 54.5 mL/min (by C-G formula based on SCr of 0.99 mg/dL).   Medical History: Past Medical History:  Diagnosis Date  . Allergy   . Anemia   . Anxiety   . Arthritis   . Bilateral pulmonary embolism ([place]) 06/23/2017  . Chronic diastolic CHF (congestive heart failure) (HCC)    a. Echo 1/16:  mild LVH, EF normal, grade 1 DD, MAC  . Chronic venous insufficiency    chronic LE edema  . Depression   . Fibromyalgia    constant pain  . Hx of cardiac catheterization    a. LHC in [place] "ok" per patient with mild plaque in a single vessel - records not available  . Hx of cardiovascular stress test    a. Nuclear study in 2008 normal  . Hx of colonic polyps   . Hypertension   . Hypertriglyceridemia   . Impaired fasting glucose   . PONV (postoperative nausea and vomiting)   . PUD (peptic ulcer disease)    hx of gastric ulcer  . Pulmonary emboli ([place]) 06/2017  . Vitamin B12 deficiency     Medications:  Medications Prior to Admission  Medication Sig Dispense Refill Last Dose   . acetaminophen (TYLENOL) 500 MG tablet Take 500 mg by mouth every 6 (six) hours as needed for headache (pain).   11/05/2017 at Unknown time  . apixaban (ELIQUIS) 5 MG TABS tablet Take 1 tablet (5 mg total) by mouth 2 (two) times daily. 30 tablet 0 11/06/2017 at 900  . furosemide (LASIX) 80 MG tablet Take 1 tablet (80 mg total) by mouth daily. (Patient taking differently: Take 160 mg by mouth daily. ) 30 tablet 6 couple days ago  . gabapentin (NEURONTIN) 100 MG capsule Take 100-200 mg by mouth See admin instructions. Tapering up to 300 mg three times daily: 5/4, 5/5, 5/6 - take one 300 mg capsule every morning and two capsules (200 mg)  at bedtime; 5/7, 5/8, 5/9 take one 300 mg capsule twice daily, 5/10, 5/11, 5/12, take one 300 mg capsule every morning and one 300 mg capsule with one capsule (100 mg) at bedtime, 5/13, 5/14, 5/15 - take 800 mg during the day (unknown directions), 5/16 and going forward take one 300 mg capsule three times daily   11/05/2017 at pm  . gabapentin (NEURONTIN) 300 MG capsule Take 1 capsule (300 mg  total) by mouth 3 (three) times daily. (Patient taking differently: Take 300 mg by mouth See admin instructions. Tapering up to 300 mg three times daily: 5/4, 5/5, 5/6 - take one capsule (300 mg) every morning and two 100 mg capsules at bedtime; 5/7, 5/8, 5/9 take one capsule (300 mg) twice daily, 5/10, 5/11, 5/12, take one capsule (300 mg) every morning and one capsule (300 mg) with a 100 mg capsule at bedtime, 5/13, 5/14, 5/15 - take 800 mg during the day (unknown directions), 5/16 and going forward take one capsule (300 mg) three times daily) 270 capsule 3 11/06/2017 at am  . potassium chloride SA (K-DUR,KLOR-CON) 20 MEQ tablet TAKE 2 TABLETS (40 MEQ TOTAL) DAILY 180 tablet 3 11/05/2017 at am  . triamterene-hydrochlorothiazide (MAXZIDE-25) 37.5-25 MG tablet Take 1 tablet by mouth daily. 90 tablet 3 11/05/2017 at am  . acetaminophen (TYLENOL) 325 MG tablet Take 2 tablets (650 mg total) by mouth  every 6 (six) hours as needed for mild pain. (Patient not taking: Reported on 11/06/2017)   Not Taking at Unknown time   Scheduled:  . feeding supplement  1 Container Oral BID BM  . gabapentin  200 mg Oral QHS   And  . gabapentin  300 mg Oral Daily  . lip balm  1 application Topical BID  . psyllium  1 packet Oral Daily  . saccharomyces boulardii  250 mg Oral BID    Assessment: 83 y.o female on Eliquis 5 mg BID PTA for hx PE (06/2017, extensive bilateral PE without right heart strain per CTA). Holding Eliquis and transitioned to heparin as may need procedure. Also with FOB positive on admit. Heparin level this am is still falsely elevated at 1.21 and aPTT is therapeutic at 78. Hgb 8.4, plts wnl.  Goal of Therapy:  aPTT 66-102 seconds  Heparin level 0.3-0.7 units/ml Monitor platelets by anticoagulation protocol: Yes   Plan:  Continue heparin gtt at 1,600 units/hr Monitor daily heparin level / aPTT, CBC, s/s of bleed F/U restart of Eliquis, may need SB resection  Elenor Quinones, PharmD, BCPS Clinical Pharmacist Pager [phone] 11/08/2017 9:51 AM

## 2017-11-08 NOTE — Progress Notes (Signed)
PROGRESS NOTE   Chelsea Keller  FAO:[id number]    DOB: 11-Nov-1934    DOA: 11/06/2017  PCP: Venia Carbon, MD   I have briefly reviewed patients previous medical records in [facility].  Brief Narrative:  83 year old female, lives with her daughter and 3 adult grand sons, independent, PMH of acute PE (extensive bilateral PE without right heart strain by CTA chest 06/23/2017) on Eliquis since, chronic diastolic CHF, chronic lower extremity venous insufficiency/edema, anxiety, depression, HTN, PUD, B12 deficiency, tented to [facility] ED on 5/4 with nausea and abdominal pain.  CT abdomen 5/4 suggestive of PSBO.  General surgery consulted and suspect jejunal diverticulitis with microperforation and PSBO.    Assessment & Plan:   Principal Problem:   Diverticulitis of small intestine with perforation Active Problems:   Essential hypertension   BMI 50.0-59.9, adult (HCC)   Chronic diastolic congestive heart failure (HCC)   Anemia   CKD (chronic kidney disease) stage 3, GFR 30-59 ml/min (HCC)   Small bowel obstruction (HCC)   History of pulmonary embolism   Chronic anticoagulation   Diverticulitis of small intestine with microperforation and PSBO: General surgery consultation and follow-up appreciated.  Discussed with Dr. Johney Maine 5/5.  Continue conservative management with bowel rest/clear liquids, IV ceftriaxone + Flagyl, serial x-rays, CT abdomen in 4-5 days if not better to rule out abscess, try for elective surgery.  No significant change since yesterday.  Surgery following, advanced to full liquids.  Abdominal x-ray shows no perforation or high-grade obstruction but mildly distended small bowel loops.  Mobilize.  History of bilateral pulmonary embolism: Diagnosed mid December 2018.  Felt to be unprovoked but patient has sedentary lifestyle.  No DVT or right heart strain on echo at that time.  Has been on Eliquis since (still <6 months).  Eliquis held (last dose 11/05/2017) due to presenting  diagnosis.  Discussed with general surgery and okay to use IV heparin which can be promptly discontinued if has bleeding complications or if surgery warranted.  As per extensive discussion with patient, no overt bleeding at this time.  Continue IV heparin.  Iron deficiency anemia:?  Related to slow chronic GI blood loss versus others.  Hemoglobin 12/24: 11.5 which may be her baseline.  Presented with hemoglobin of 7.5 in the absence of overt bleeding.  Patient clarifies that she did not have melena or blood in her stools.  FOBT +.  Transfused 2 units of PRBC on admission and hemoglobin up to 8.8.  [facility]/Dr. Fuller Plan did EGD 10/15/2014, unremarkable except for mild gastritis and colonoscopy: Multiple polyps removed, diverticulosis and internal hemorrhoids.  Iron supplements when GI issues improved versus consider IV iron replacement.  Monitor closely while on IV heparin infusion.  No clear indication for GI consultation at this time and may also not want to intervene currently given microperforation.  Hemoglobin stable.  Essential hypertension: Controlled.  Hold thiazide diuretics for now.  Chronic diastolic CHF: Chest x-ray suggest mild CHF although patient not very symptomatic.  Discontinue IV fluids.  Monitor closely.  Hypokalemia: Replaced.  Hypomagnesemia: Replace IV and follow.  History of PUD: Continue Pepcid.  Stage III chronic kidney disease: Stable.  Chronic lower extremity venous insufficiency/edema: Not much edema at this time.  Start low-dose gabapentin which patient had just started and was tapering up.  Ongoing pain in the legs which appears chronic.  Anxiety & depression: Stable.  Morbid obesity/Body mass index is 47.86 kg/m.    DVT prophylaxis: IV heparin infusion per pharmacy.  Eliquis held. Code Status: Full Family Communication: Discussed in detail with patient's daughter at bedside. Disposition: DC home pending medical improvement.  Not medically ready for  discharge.   Consultants:  General surgery  Procedures:  None  Antimicrobials:  IV ceftriaxone + Flagyl.   Subjective: Had abdominal pain again this morning, improved after pain medications.  Tolerating clear liquids.  No nausea or vomiting.  Flatus +.  No BM.  Reports pain in both her legs which are chronic related to neuropathy..  ROS: As above.  Objective:  Vitals:   11/07/17 1417 11/07/17 2054 11/08/17 0500 11/08/17 1334  BP: 133/61 128/65 112/64 132/64  Pulse: 81 78 85 83  Resp:  17 17 20   Temp: 97.9 F (36.6 C) 98.8 F (37.1 C) 99 F (37.2 C) 99.2 F (37.3 C)  TempSrc: Oral Oral Oral Oral  SpO2: 98% 95% 92% 91%  Weight:      Height:        Examination:  General exam: Pleasant elderly female, moderately built and obese, lying comfortably supine in bed.  Oral mucosa moist. Respiratory system: Clear to auscultation. Respiratory effort normal.  Stable. Cardiovascular system: S1 & S2 heard, RRR. No JVD, murmurs, rubs, gallops or clicks. No pedal edema.  Stable. Gastrointestinal system: Abdomen is obese, not distended, diffuse mild tenderness without guarding, rigidity or rebound.  No organomegaly or masses felt. Normal bowel sounds heard.  Right upper quadrant laparotomy scar. Central nervous system: Alert and oriented. No focal neurological deficits.  Stable. Extremities: Symmetric 5 x 5 power.  Stable. Skin: No rashes, lesions or ulcers Psychiatry: Judgement and insight appear normal. Mood & affect appropriate.     Data Reviewed: I have personally reviewed following labs and imaging studies  CBC: Recent Labs  Lab 11/06/17 1440 11/07/17 0926 11/08/17 0637  WBC 9.7 10.2 11.6*  HGB 7.5* 8.8* 8.4*  HCT 26.6* 30.7* 29.0*  MCV 75.8* 79.5 78.4  PLT 386 315 353   Basic Metabolic Panel: Recent Labs  Lab 11/06/17 1440 11/07/17 0926 11/08/17 0637  NA 138 139 137  K 3.4* 3.4* 3.7  CL 97* 103 103  CO2 28 27 26   GLUCOSE 137* 114* 106*  BUN 27* 19 15   CREATININE 1.02* 0.98 0.99  CALCIUM 9.1 8.6* 8.5*  MG  --   --  1.6*   Liver Function Tests: Recent Labs  Lab 11/06/17 1627 11/07/17 0926  AST 18 22  ALT 11* 12*  ALKPHOS 77 76  BILITOT 0.9 1.0  PROT 6.2* 5.6*  ALBUMIN 3.1* 2.8*      Radiology Studies: Dg Abd Acute W/chest  Result Date: 11/08/2017 CLINICAL DATA:  Abdominal and chest discomfort but no pain. Abdominal ileus. Small bowel inflammatory changes on earlier CT. EXAM: DG ABDOMEN ACUTE W/ 1V CHEST COMPARISON:  Chest x-ray dated Nov 06, 2017 FINDINGS: The lungs are well-expanded. The interstitial markings are increased. The cardiac silhouette is enlarged and the pulmonary vascularity is mildly engorged. There is no pleural effusion. There is calcification in the wall of the aortic arch. There is severe degenerative change of the right shoulder. Within the abdomen there is a moderate amount of gas within normal caliber small bowel loops with several loops of distended small bowel in the mid abdomen. The stool and gas pattern in the colon and rectum is normal. The observed bony structures are unremarkable. IMPRESSION: No evidence of perforation or high-grade obstruction. There is mild distention of several small bowel loops in the mid abdomen. Chest x-ray  findings worrisome for mild CHF. Thoracic aortic atherosclerosis. Electronically Signed   By: David  Martinique M.D.   On: 11/08/2017 10:24        Scheduled Meds: . feeding supplement  1 Container Oral BID BM  . gabapentin  200 mg Oral QHS   And  . gabapentin  300 mg Oral Daily  . lip balm  1 application Topical BID  . psyllium  1 packet Oral Daily  . saccharomyces boulardii  250 mg Oral BID   Continuous Infusions: . cefTRIAXone (ROCEPHIN)  IV Stopped (11/08/17 1439)  . dextrose 5 % and 0.9% NaCl 50 mL/hr at 11/08/17 0434  . famotidine (PEPCID) IV Stopped (11/08/17 1032)  . heparin 1,600 Units/hr (11/07/17 2238)  . lactated ringers    . methocarbamol (ROBAXIN)  IV    .  metronidazole Stopped (11/08/17 1552)     LOS: 2 days     Vernell Leep, MD, FACP, [facility]. [facility] Pager [phone]  If 7PM-7AM, please contact night-coverage [email or web address] Password [facility] 11/08/2017, 6:36 PM

## 2017-11-08 NOTE — Progress Notes (Signed)
Patient refused metamucil and ice application, re-educated about benefits of both.

## 2017-11-08 NOTE — Progress Notes (Addendum)
Patient ID: Chelsea Keller, female   DOB: March 24, 1935, 83 y.o.   MRN: [id number]   Acute Care Surgery Service Progress Note:    Chief Complaint/Subjective: Had pain in b/l ankles/feet when tried to walk yesterday No nausea/emesis; tolerated clears, wants solids Still with some abd pain; +Flatus Daughter at [facility]  Objective: Vital signs in last 24 hours: Temp:  [97.9 F (36.6 C)-99 F (37.2 C)] 99 F (37.2 C) (05/06 0500) Pulse Rate:  [78-85] 85 (05/06 0500) Resp:  [17] 17 (05/06 0500) BP: (112-133)/(61-65) 112/64 (05/06 0500) SpO2:  [92 %-98 %] 92 % (05/06 0500)    Intake/Output from previous day: 05/05 0701 - 05/06 0700 In: 2740.4 [P.O.:360; I.V.:1572.4; Blood:308; IV Piggyback:500] Out: 650 [Urine:650] Intake/Output this shift: No intake/output data recorded.  Lungs: cta, nonlabored  Cardiovascular: reg  Abd: severe obesity, soft, mild central TTP, no rebound/guarding/peritonitis  Extremities: trace edema, +SCDs  Neuro: alert, nonfocal  Lab Results: CBC  Recent Labs    11/07/17 0926 11/08/17 0637  WBC 10.2 11.6*  HGB 8.8* 8.4*  HCT 30.7* 29.0*  PLT 315 315   BMET Recent Labs    11/07/17 0926 11/08/17 0637  NA 139 137  K 3.4* 3.7  CL 103 103  CO2 27 26  GLUCOSE 114* 106*  BUN 19 15  CREATININE 0.98 0.99  CALCIUM 8.6* 8.5*   LFT Hepatic Function Latest Ref Rng & Units 11/07/2017 11/06/2017 09/13/2017  Total Protein 6.5 - 8.1 g/dL 5.6(L) 6.2(L) -  Albumin 3.5 - 5.0 g/dL 2.8(L) 3.1(L) 3.5  AST 15 - 41 U/L 22 18 -  ALT 14 - 54 U/L 12(L) 11(L) -  Alk Phosphatase 38 - 126 U/L 76 77 -  Total Bilirubin 0.3 - 1.2 mg/dL 1.0 0.9 -  Bilirubin, Direct 0.1 - 0.5 mg/dL - 0.2 -   PT/INR No results for input(s): LABPROT, INR in the last 72 hours. ABG No results for input(s): PHART, HCO3 in the last 72 hours.  Invalid input(s): PCO2, PO2  Studies/Results:  Anti-infectives: Anti-infectives (From admission, onward)   Start     Dose/Rate Route Frequency Ordered  Stop   11/08/17 1300  metroNIDAZOLE (FLAGYL) IVPB 500 mg     500 mg 100 mL/hr over 60 Minutes Intravenous Every 8 hours 11/08/17 0836     11/07/17 1100  metroNIDAZOLE (FLAGYL) IVPB 500 mg  Status:  Discontinued     500 mg 100 mL/hr over 60 Minutes Intravenous Every 6 hours 11/07/17 0918 11/08/17 0836   11/07/17 1000  cefTRIAXone (ROCEPHIN) 2 g in sodium chloride 0.9 % 100 mL IVPB    Note to Pharmacy:  Pharmacy may adjust dosing strength / duration / interval for maximal efficacy   2 g 200 mL/hr over 30 Minutes Intravenous Every 24 hours 11/07/17 0918        Medications: Scheduled Meds: . gabapentin  200 mg Oral QHS   And  . gabapentin  300 mg Oral Daily  . lip balm  1 application Topical BID  . psyllium  1 packet Oral Daily  . saccharomyces boulardii  250 mg Oral BID   Continuous Infusions: . cefTRIAXone (ROCEPHIN)  IV Stopped (11/07/17 1241)  . dextrose 5 % and 0.9% NaCl 50 mL/hr at 11/08/17 0434  . famotidine (PEPCID) IV Stopped (11/07/17 2309)  . heparin 1,600 Units/hr (11/07/17 2238)  . lactated ringers    . methocarbamol (ROBAXIN)  IV    . metronidazole     PRN Meds:.acetaminophen, alum & mag hydroxide-simeth, bisacodyl, guaiFENesin-dextromethorphan,  hydrocortisone, hydrocortisone cream, lactated ringers, magic mouthwash, menthol-cetylpyridinium, methocarbamol **OR** methocarbamol (ROBAXIN)  IV, morphine injection, ondansetron **OR** ondansetron (ZOFRAN) IV, phenol, prochlorperazine  Assessment/Plan: Patient Active Problem List   Diagnosis Date Noted  . History of pulmonary embolism 11/07/2017  . Chronic anticoagulation 11/07/2017  . Diverticulitis of small intestine with perforation 11/07/2017  . Small bowel obstruction ([place]) 11/06/2017  . Neurodermatitis 07/28/2017  . Urinary incontinence 05/26/2017  . Intractable pain-left hip 04/03/2017  . Fibromyalgia 04/03/2017  . Anemia 04/03/2017  . Chronic diastolic CHF (congestive heart failure) ([place]) 04/03/2017  .  Hypertension 04/03/2017  . Hypertriglyceridemia 04/03/2017  . Vitamin B12 deficiency 04/03/2017  . CKD (chronic kidney disease) stage 3, GFR 30-59 ml/min (HCC) 04/03/2017  . Acute hypokalemia 04/03/2017  . Left sided sciatica   . Advance directive discussed with patient 01/19/2017  . Chronic diastolic congestive heart failure ([place]) 05/09/2014  . Mood disorder ([place]) 11/03/2013  . Knee joint pain 10/31/2013  . Psoriasis 02/21/2013  . Routine general medical examination at a health care facility 06/14/2012  . BMI 50.0-59.9, adult ([place]) 12/02/2011  . VENTRICULAR HYPERTROPHY, LEFT 10/19/2008  . Sleep disturbance 10/25/2007  . Venous (peripheral) insufficiency 08/11/2007  . Osteoarthritis, multiple sites 08/11/2007  . Impaired fasting glucose 08/11/2007  . COLONIC POLYPS, HX OF 05/20/2007  . B12 DEFICIENCY 09/30/2006  . Essential hypertension 09/30/2006  . Allergic rhinitis due to pollen 09/30/2006   Abdominal pain Acute on chronic anemia - iron def Chronic diastolic heart failure HTN Recent Pulmonary Emboli Dec 2018 on eliquis Severe obesity Mild protein calorie malnutrition HTN Fibromyalgia  SB diverticulitis vs ileus vs psbo No fever. abd Exam about same to me. Did well with clears. Small wbc.   Cont iv abx Full liquids Will f/u abd xray that is ordered May need repeat CT in a few days if doesn't rapidly improve and/or worsens  Cont hep gtt for recent PE Spend time in chair Consider PT consult  Disposition:  LOS: 2 days    Leighton Ruff. Redmond Pulling, MD, FACS General, Bariatric, & Minimally Invasive Surgery [phone] [facility]

## 2017-11-09 ENCOUNTER — Telehealth: Payer: Self-pay | Admitting: Internal Medicine

## 2017-11-09 DIAGNOSIS — E876 Hypokalemia: Secondary | ICD-10-CM

## 2017-11-09 LAB — BASIC METABOLIC PANEL
ANION GAP: 9 (ref 5–15)
BUN: 14 mg/dL (ref 6–20)
CHLORIDE: 101 mmol/L (ref 101–111)
CO2: 27 mmol/L (ref 22–32)
CREATININE: 0.94 mg/dL (ref 0.44–1.00)
Calcium: 8.6 mg/dL — ABNORMAL LOW (ref 8.9–10.3)
GFR calc non Af Amer: 55 mL/min — ABNORMAL LOW (ref >60)
Glucose, Bld: 96 mg/dL (ref 65–99)
Potassium: 3.3 mmol/L — ABNORMAL LOW (ref 3.5–5.1)
SODIUM: 137 mmol/L (ref 135–145)

## 2017-11-09 LAB — CBC
HCT: 29.1 % — ABNORMAL LOW (ref 36.0–46.0)
Hemoglobin: 8.5 g/dL — ABNORMAL LOW (ref 12.0–15.0)
MCH: 23.2 pg — ABNORMAL LOW (ref 26.0–34.0)
MCHC: 29.2 g/dL — ABNORMAL LOW (ref 30.0–36.0)
MCV: 79.3 fL (ref 78.0–100.0)
Platelets: 320 10*3/uL (ref 150–400)
RBC: 3.67 MIL/uL — ABNORMAL LOW (ref 3.87–5.11)
RDW: 19 % — ABNORMAL HIGH (ref 11.5–15.5)
WBC: 11.5 10*3/uL — ABNORMAL HIGH (ref 4.0–10.5)

## 2017-11-09 LAB — HEPARIN LEVEL (UNFRACTIONATED): Heparin Unfractionated: 1.08 IU/mL — ABNORMAL HIGH (ref 0.30–0.70)

## 2017-11-09 LAB — MAGNESIUM: Magnesium: 1.8 mg/dL (ref 1.7–2.4)

## 2017-11-09 LAB — APTT: aPTT: 106 seconds — ABNORMAL HIGH (ref 24–36)

## 2017-11-09 MED ORDER — GABAPENTIN 300 MG PO CAPS
300.0000 mg | ORAL_CAPSULE | Freq: Two times a day (BID) | ORAL | Status: DC
  Administered 2017-11-09 – 2017-11-10 (×2): 300 mg via ORAL
  Filled 2017-11-09 (×2): qty 1

## 2017-11-09 MED ORDER — POTASSIUM CHLORIDE CRYS ER 20 MEQ PO TBCR
40.0000 meq | EXTENDED_RELEASE_TABLET | Freq: Once | ORAL | Status: AC
  Administered 2017-11-09: 40 meq via ORAL
  Filled 2017-11-09: qty 2

## 2017-11-09 NOTE — Progress Notes (Signed)
Patient ID: Chelsea Keller, female   DOB: 1935/04/11, 83 y.o.   MRN: [id number]   Acute Care Surgery Service Progress Note:    Chief Complaint/Subjective: "i'm tired of liquids" wants solids. Refuses anything other than solids Had 1 episode of abd discomfort this am but went away No n/v Tolerated liquids Some flatus no BM Didn't get out of bed yesterday  Objective: Vital signs in last 24 hours: Temp:  [98 F (36.7 C)-99.2 F (37.3 C)] 98 F (36.7 C) (05/07 0559) Pulse Rate:  [79-83] 81 (05/07 0559) Resp:  [18-20] 20 (05/07 0559) BP: (127-145)/(57-64) 127/64 (05/07 0559) SpO2:  [91 %-96 %] 93 % (05/07 0559) Last BM Date: (pta)  Intake/Output from previous day: 05/06 0701 - 05/07 0700 In: 839.6 [I.V.:589.6; IV Piggyback:250] Out: -  Intake/Output this shift: No intake/output data recorded.  Lungs: cta, nonlabored  Cardiovascular: reg  Abd: soft, obese, less TTP than yesterday. No guarding/rebound  Extremities: no edema, +SCDs  Neuro: alert, nonfocal  Lab Results: CBC  Recent Labs    11/08/17 0637 11/09/17 0344  WBC 11.6* 11.5*  HGB 8.4* 8.5*  HCT 29.0* 29.1*  PLT 315 320   BMET Recent Labs    11/08/17 0637 11/09/17 0344  NA 137 137  K 3.7 3.3*  CL 103 101  CO2 26 27  GLUCOSE 106* 96  BUN 15 14  CREATININE 0.99 0.94  CALCIUM 8.5* 8.6*   LFT Hepatic Function Latest Ref Rng & Units 11/07/2017 11/06/2017 09/13/2017  Total Protein 6.5 - 8.1 g/dL 5.6(L) 6.2(L) -  Albumin 3.5 - 5.0 g/dL 2.8(L) 3.1(L) 3.5  AST 15 - 41 U/L 22 18 -  ALT 14 - 54 U/L 12(L) 11(L) -  Alk Phosphatase 38 - 126 U/L 76 77 -  Total Bilirubin 0.3 - 1.2 mg/dL 1.0 0.9 -  Bilirubin, Direct 0.1 - 0.5 mg/dL - 0.2 -   PT/INR No results for input(s): LABPROT, INR in the last 72 hours. ABG No results for input(s): PHART, HCO3 in the last 72 hours.  Invalid input(s): PCO2, PO2  Studies/Results:  Anti-infectives: Anti-infectives (From admission, onward)   Start     Dose/Rate Route  Frequency Ordered Stop   11/08/17 1300  metroNIDAZOLE (FLAGYL) IVPB 500 mg     500 mg 100 mL/hr over 60 Minutes Intravenous Every 8 hours 11/08/17 0836     11/07/17 1100  metroNIDAZOLE (FLAGYL) IVPB 500 mg  Status:  Discontinued     500 mg 100 mL/hr over 60 Minutes Intravenous Every 6 hours 11/07/17 0918 11/08/17 0836   11/07/17 1000  cefTRIAXone (ROCEPHIN) 2 g in sodium chloride 0.9 % 100 mL IVPB    Note to Pharmacy:  Pharmacy may adjust dosing strength / duration / interval for maximal efficacy   2 g 200 mL/hr over 30 Minutes Intravenous Every 24 hours 11/07/17 0918        Medications: Scheduled Meds: . feeding supplement  1 Container Oral BID BM  . gabapentin  200 mg Oral QHS   And  . gabapentin  300 mg Oral Daily  . lip balm  1 application Topical BID  . potassium chloride  40 mEq Oral Once  . psyllium  1 packet Oral Daily  . saccharomyces boulardii  250 mg Oral BID   Continuous Infusions: . cefTRIAXone (ROCEPHIN)  IV Stopped (11/08/17 1439)  . famotidine (PEPCID) IV Stopped (11/08/17 2323)  . heparin 1,600 Units/hr (11/08/17 2221)  . methocarbamol (ROBAXIN)  IV    . metronidazole Stopped (  11/09/17 0507)   PRN Meds:.acetaminophen, alum & mag hydroxide-simeth, bisacodyl, guaiFENesin-dextromethorphan, hydrocortisone, hydrocortisone cream, magic mouthwash, menthol-cetylpyridinium, methocarbamol **OR** methocarbamol (ROBAXIN)  IV, morphine injection, ondansetron **OR** ondansetron (ZOFRAN) IV, phenol, prochlorperazine  Assessment/Plan: Patient Active Problem List   Diagnosis Date Noted  . History of pulmonary embolism 11/07/2017  . Chronic anticoagulation 11/07/2017  . Diverticulitis of small intestine with perforation 11/07/2017  . Small bowel obstruction ([place]) 11/06/2017  . Neurodermatitis 07/28/2017  . Urinary incontinence 05/26/2017  . Intractable pain-left hip 04/03/2017  . Fibromyalgia 04/03/2017  . Anemia 04/03/2017  . Chronic diastolic CHF (congestive heart  failure) ([place]) 04/03/2017  . Hypertension 04/03/2017  . Hypertriglyceridemia 04/03/2017  . Vitamin B12 deficiency 04/03/2017  . CKD (chronic kidney disease) stage 3, GFR 30-59 ml/min (HCC) 04/03/2017  . Acute hypokalemia 04/03/2017  . Left sided sciatica   . Advance directive discussed with patient 01/19/2017  . Chronic diastolic congestive heart failure ([place]) 05/09/2014  . Mood disorder ([place]) 11/03/2013  . Knee joint pain 10/31/2013  . Psoriasis 02/21/2013  . Routine general medical examination at a health care facility 06/14/2012  . BMI 50.0-59.9, adult ([place]) 12/02/2011  . VENTRICULAR HYPERTROPHY, LEFT 10/19/2008  . Sleep disturbance 10/25/2007  . Venous (peripheral) insufficiency 08/11/2007  . Osteoarthritis, multiple sites 08/11/2007  . Impaired fasting glucose 08/11/2007  . COLONIC POLYPS, HX OF 05/20/2007  . B12 DEFICIENCY 09/30/2006  . Essential hypertension 09/30/2006  . Allergic rhinitis due to pollen 09/30/2006   Abdominal pain Acute on chronic anemia - iron def Chronic diastolic heart failure HTN Recent Pulmonary Emboli Dec 2018 on eliquis Severe obesity Mild protein calorie malnutrition HTN Fibromyalgia  SB diverticulitis vs ileus vs psbo No fever. abd Exam improved. Did well with fulls. stable wbc Film yesterday not terribly remarkable  Tried to explain rationale of why we start with liquids and then gradually adv based on how pt responds to oral intake (exam, labs, vitals, etc) but pt cut me off despite several attempts to explain  Adv to soft diet Cont abx Will let medicine replace Potassium, try to keep potassium at 4 Get PT/ot consult   Disposition:  LOS: 3 days    Leighton Ruff. Redmond Pulling, MD, FACS General, Bariatric, & Minimally Invasive Surgery [phone] [facility]

## 2017-11-09 NOTE — Evaluation (Signed)
Physical Therapy Evaluation Patient Details Name: Chelsea Keller MRN: [id number] DOB: Mar 14, 1935 Today's Date: 11/09/2017   History of Present Illness  Pt is an 83 y/o female admitted secondary to diverticulitis of small intestine with perforation. PMH includes dCHF, HTN, CKD, PE, and L TKA.   Clinical Impression  Pt admitted secondary to problem above with deficits below. Pt very limited by pain this session. Required mod A for basic bed mobility tasks, however, refused further mobility as pt with increased R ankle and hip pain. Feel pt is a high fall risk and is currently unable to perform mobility tasks that will need to be performed at home. Will continue to follow acutely to maximize functional mobility independence and safety.     Follow Up Recommendations SNF;Supervision/Assistance - 24 hour(pt will likely refuse )    Equipment Recommendations  Wheelchair (measurements PT);Wheelchair cushion (measurements PT)    Recommendations for Other Services OT consult     Precautions / Restrictions Precautions Precautions: Fall Restrictions Weight Bearing Restrictions: No      Mobility  Bed Mobility Overal bed mobility: Needs Assistance Bed Mobility: Supine to Sit;Sit to Supine     Supine to sit: Mod assist Sit to supine: Mod assist   General bed mobility comments: Mod A for LE assist and trunk elevation to come to sitting. Pt sat EOB for about 8-10 minutes. Refused to stand secondary to pain reporting "I can't. It hurts too bad." Pt tearful during session. Mod A for retur to return to supine.   Transfers                 General transfer comment: Pt refusing   Ambulation/Gait                Stairs            Wheelchair Mobility    Modified Rankin (Stroke Patients Only)       Balance Overall balance assessment: Needs assistance Sitting-balance support: No upper extremity supported;Feet supported Sitting balance-Leahy Scale: Good                                        Pertinent Vitals/Pain Pain Assessment: Faces Faces Pain Scale: Hurts whole lot Pain Location: R ankle R hip  Pain Descriptors / Indicators: Sharp;Grimacing Pain Intervention(s): Limited activity within patient's tolerance;Monitored during session;Repositioned    Home Living Family/patient expects to be discharged to:: Private residence Living Arrangements: Children Available Help at Discharge: Family;Available PRN/intermittently Type of Home: House Home Access: Stairs to enter;Level entry     Home Layout: Two level;Bed/bath upstairs Home Equipment: Bedside commode;Tub bench;Walker - 4 wheels;Cane - single point;Grab bars - tub/shower;Shower seat;Other (comment)(chair lift )      Prior Function Level of Independence: Independent with assistive device(s)         Comments: Uses rollator for ambulation      Hand Dominance        Extremity/Trunk Assessment   Upper Extremity Assessment Upper Extremity Assessment: Defer to OT evaluation    Lower Extremity Assessment Lower Extremity Assessment: RLE deficits/detail RLE Deficits / Details: Able to perform LAQ. Limited ankle/hip mobility secondary to pain.  RLE: Unable to fully assess due to pain    Cervical / Trunk Assessment Cervical / Trunk Assessment: Kyphotic  Communication   Communication: No difficulties  Cognition Arousal/Alertness: Awake/alert Behavior During Therapy: Anxious Overall Cognitive Status: Within Functional Limits for tasks  assessed                                 General Comments: Pt anxious about bearing weight on RLE and refused OOB mobility       General Comments General comments (skin integrity, edema, etc.): Educated about deficits and d/c recommendations, however, pt reports she is wanting to go home and does not want to go back to SNF.     Exercises     Assessment/Plan    PT Assessment Patient needs continued PT services  PT Problem  List Decreased strength;Decreased balance;Decreased activity tolerance;Decreased range of motion;Decreased mobility;Decreased knowledge of use of DME;Decreased knowledge of precautions;Pain       PT Treatment Interventions DME instruction;Gait training;Functional mobility training;Therapeutic activities;Therapeutic exercise;Balance training;Patient/family education;Wheelchair mobility training    PT Goals (Current goals can be found in the Care Plan section)  Acute Rehab PT Goals Patient Stated Goal: to go home  PT Goal Formulation: With patient Time For Goal Achievement: 11/23/17 Potential to Achieve Goals: Good    Frequency Min 3X/week   Barriers to discharge        Co-evaluation               AM-PAC PT "6 Clicks" Daily Activity  Outcome Measure Difficulty turning over in bed (including adjusting bedclothes, sheets and blankets)?: A Lot Difficulty moving from lying on back to sitting on the side of the bed? : Unable Difficulty sitting down on and standing up from a chair with arms (e.g., wheelchair, bedside commode, etc,.)?: Unable Help needed moving to and from a bed to chair (including a wheelchair)?: Total Help needed walking in hospital room?: Total Help needed climbing 3-5 steps with a railing? : Total 6 Click Score: 7    End of Session Equipment Utilized During Treatment: Gait belt Activity Tolerance: Patient limited by pain Patient left: in bed;with call bell/phone within reach;with nursing/sitter in room Nurse Communication: Mobility status PT Visit Diagnosis: Unsteadiness on feet (R26.81);Difficulty in walking, not elsewhere classified (R26.2);Pain Pain - Right/Left: Right Pain - part of body: Hip;Ankle and joints of foot    Time: 1330-1356 PT Time Calculation (min) (ACUTE ONLY): 26 min   Charges:   PT Evaluation $PT Eval Moderate Complexity: 1 Mod PT Treatments $Therapeutic Activity: 8-22 mins   PT G Codes:        Leighton Ruff, PT, DPT   Acute Rehabilitation Services  Pager: [phone]   Rudean Hitt 11/09/2017, 2:32 PM

## 2017-11-09 NOTE — Telephone Encounter (Signed)
I would tell her to request a physical therapy evaluation - they will have to determine when she is safe /stable enough to begin that   I will cc to PCP as well so he is aware when he returns

## 2017-11-09 NOTE — Telephone Encounter (Signed)
Copied from [place] [id number]. Topic: Quick Communication - See Telephone Encounter >> Nov 09, 2017 11:22 AM Margot Ables wrote: CRM for notification. See Telephone encounter for: 11/09/17. Pt was admitted to [facility] Saturday 11/06/17 for Jejunal Diverticulitis micro perforation bowl obstruction. She is reverting back to how she was in December and is not able to walk again. [name] is requesting call for advice as to what kind of doctor may be needed for a consult. Pt has had a blood transfusion during admission. Dr. Redmond Pulling said to have pt on bowel rest 4-5 days then repeat CT. [name] states the hospital is addressing bowel obstruction but not focused on pt being non-ambulatory. Call back # [phone] (work # before 3:30pm) or # [phone] (cell # if after 3:30pm).

## 2017-11-09 NOTE — Telephone Encounter (Signed)
Pt is presently admitted to [facility]; Dr Silvio Pate does not return until 11/11/17.Please advise.

## 2017-11-09 NOTE — Telephone Encounter (Signed)
Left message on VM for daughter

## 2017-11-09 NOTE — Progress Notes (Addendum)
Patient refused to get out the bed,  PT/OT pending Attempted twice to get patient out of bed to chair. Patient stated she has pain in her right foot. She said this has happen before. She describes it as sharp. worsen with activity.

## 2017-11-09 NOTE — Progress Notes (Addendum)
PROGRESS NOTE   Chelsea Keller  IWP:[id number]    DOB: 05-13-1935    DOA: 11/06/2017  PCP: Venia Carbon, MD   I have briefly reviewed patients previous medical records in [facility].  Brief Narrative:  83 year old female, lives with her daughter and 3 adult grand sons, independent, PMH of acute PE (extensive bilateral PE without right heart strain by CTA chest 06/23/2017) on Eliquis since, chronic diastolic CHF, chronic lower extremity venous insufficiency/edema, anxiety, depression, HTN, PUD, B12 deficiency, tented to [facility] ED on 5/4 with nausea and abdominal pain.  CT abdomen 5/4 suggestive of PSBO.  General surgery consulted and suspect jejunal diverticulitis with microperforation and PSBO.    Assessment & Plan:   Principal Problem:   Diverticulitis of small intestine with perforation Active Problems:   Essential hypertension   BMI 50.0-59.9, adult (HCC)   Chronic diastolic congestive heart failure (HCC)   Anemia   CKD (chronic kidney disease) stage 3, GFR 30-59 ml/min (HCC)   Small bowel obstruction (HCC)   History of pulmonary embolism   Chronic anticoagulation   Diverticulitis of small intestine with ? microperforation and PSBO Vs Ileus: General surgery consultation and follow-up appreciated.  Treating supportively with bowel rest (diet gradually advance to soft diet on 5/7), IV ceftriaxone + Flagyl, serial x-rays, CT abdomen in 4-5 days if not better to rule out abscess.  Abdominal x-ray 5/6 shows no perforation or high-grade obstruction but mildly distended small bowel loops.  Surgery follow-up appreciated.  Discussed with Dr. Redmond Pulling.  Patient refusing liquids and insisting on solid foods, now advanced to soft diet.  If does well with soft diet, has BM and no further decline, potentially could go home 5/8.  Mobilize with PT, discussed with RN.  History of bilateral pulmonary embolism: Diagnosed mid December 2018.  Felt to be unprovoked but patient has sedentary lifestyle.   No DVT or right heart strain on echo at that time.  Has been on Eliquis since (still <6 months).  Eliquis held (last dose 11/05/2017) due to presenting diagnosis.  Discussed with general surgery and okay to use IV heparin which can be promptly discontinued if has bleeding complications or if surgery warranted.  As per extensive discussion with patient, no overt bleeding at this time.  Continue IV heparin.  Stable.  Iron deficiency anemia:?  Related to slow chronic GI blood loss versus others.  Hemoglobin 12/24: 11.5 which may be her baseline.  Presented with hemoglobin of 7.5 in the absence of overt bleeding.  Patient clarifies that she did not have melena or blood in her stools.  FOBT +.  Anemia panel reviewed: Iron 22, ferritin 6.  Transfused 2 units of PRBC on admission and hemoglobin up to 8.8.  [facility]/Dr. Fuller Plan did EGD 10/15/2014, unremarkable except for mild gastritis and colonoscopy: Multiple polyps removed, diverticulosis and internal hemorrhoids.  Iron supplements when GI issues improved versus consider IV iron replacement.  Monitor closely while on IV heparin infusion.  No clear indication for GI consultation at this time and may also not want to intervene currently given microperforation.  Hemoglobin remained stable.  Consider outpatient GI follow-up.  Essential hypertension: Controlled.  Holding thiazide diuretics for now.  Chronic diastolic CHF: Chest x-ray suggested mild CHF although patient not very symptomatic.  Discontinue IV fluids.  Monitor closely.  Not currently on diuretics.  Hypokalemia: Replace aggressively and follow.  Magnesium 1.8.  Hypomagnesemia: Replaced.  1.8 today.  History of PUD: Continue Pepcid.  Stage III chronic kidney  disease: Stable/normal creatinine.  Chronic lower extremity venous insufficiency/edema: Not much edema at this time.  Discussed with pharmacy and continuing home dose of gabapentin with gradually tapering up.  Anxiety & depression:  Stable.  Morbid obesity/Body mass index is 47.86 kg/m.    DVT prophylaxis: IV heparin infusion per pharmacy.  Eliquis held. Code Status: Full Family Communication: None at bedside today. Disposition: DC home pending medical improvement.  Not medically ready for discharge.   Consultants:  General surgery  Procedures:  None  Antimicrobials:  IV ceftriaxone + Flagyl.   Subjective: Seen after [facility] had seen patient.  Reported a transient episode of abdominal pain that resolved spontaneously.  Tolerating liquids without nausea or vomiting and insisting on solid food.  Flatus but no BM yet.  Chronic lower extremity pains, did not mention any worse today.  Eager to get out of the hospital.  ROS: As above.  Objective:  Vitals:   11/08/17 0500 11/08/17 1334 11/08/17 2144 11/09/17 0559  BP: 112/64 132/64 (!) 145/57 127/64  Pulse: 85 83 79 81  Resp: 17 20 18 20   Temp: 99 F (37.2 C) 99.2 F (37.3 C) 98.3 F (36.8 C) 98 F (36.7 C)  TempSrc: Oral Oral Oral Oral  SpO2: 92% 91% 96% 93%  Weight:      Height:        Examination:  General exam: Pleasant elderly female, moderately built and obese, lying comfortably supine in bed.  Oral mucosa moist.  Does not appear in any distress. Respiratory system: Clear to auscultation.  No increased work of breathing. Cardiovascular system: S1 & S2 heard, RRR. No JVD, murmurs, rubs, gallops or clicks. No pedal edema.  Stable. Gastrointestinal system: Abdomen is obese, not distended, nontender today.  Soft.  No organomegaly or masses felt. Normal bowel sounds heard.  Right upper quadrant laparotomy scar. Central nervous system: Alert and oriented. No focal neurological deficits.  Stable. Extremities: Symmetric 5 x 5 power.  Stable. Skin: No rashes, lesions or ulcers Psychiatry: Judgement and insight appear normal. Mood & affect appropriate.     Data Reviewed: I have personally reviewed following labs and imaging studies  CBC: Recent  Labs  Lab 11/06/17 1440 11/07/17 0926 11/08/17 0637 11/09/17 0344  WBC 9.7 10.2 11.6* 11.5*  HGB 7.5* 8.8* 8.4* 8.5*  HCT 26.6* 30.7* 29.0* 29.1*  MCV 75.8* 79.5 78.4 79.3  PLT 386 315 315 678   Basic Metabolic Panel: Recent Labs  Lab 11/06/17 1440 11/07/17 0926 11/08/17 0637 11/09/17 0344  NA 138 139 137 137  K 3.4* 3.4* 3.7 3.3*  CL 97* 103 103 101  CO2 28 27 26 27   GLUCOSE 137* 114* 106* 96  BUN 27* 19 15 14   CREATININE 1.02* 0.98 0.99 0.94  CALCIUM 9.1 8.6* 8.5* 8.6*  MG  --   --  1.6* 1.8   Liver Function Tests: Recent Labs  Lab 11/06/17 1627 11/07/17 0926  AST 18 22  ALT 11* 12*  ALKPHOS 77 76  BILITOT 0.9 1.0  PROT 6.2* 5.6*  ALBUMIN 3.1* 2.8*      Radiology Studies: Dg Abd Acute W/chest  Result Date: 11/08/2017 CLINICAL DATA:  Abdominal and chest discomfort but no pain. Abdominal ileus. Small bowel inflammatory changes on earlier CT. EXAM: DG ABDOMEN ACUTE W/ 1V CHEST COMPARISON:  Chest x-ray dated Nov 06, 2017 FINDINGS: The lungs are well-expanded. The interstitial markings are increased. The cardiac silhouette is enlarged and the pulmonary vascularity is mildly engorged. There is no pleural  effusion. There is calcification in the wall of the aortic arch. There is severe degenerative change of the right shoulder. Within the abdomen there is a moderate amount of gas within normal caliber small bowel loops with several loops of distended small bowel in the mid abdomen. The stool and gas pattern in the colon and rectum is normal. The observed bony structures are unremarkable. IMPRESSION: No evidence of perforation or high-grade obstruction. There is mild distention of several small bowel loops in the mid abdomen. Chest x-ray findings worrisome for mild CHF. Thoracic aortic atherosclerosis. Electronically Signed   By: David  Martinique M.D.   On: 11/08/2017 10:24        Scheduled Meds: . feeding supplement  1 Container Oral BID BM  . gabapentin  300 mg Oral BID   . lip balm  1 application Topical BID  . psyllium  1 packet Oral Daily  . saccharomyces boulardii  250 mg Oral BID   Continuous Infusions: . cefTRIAXone (ROCEPHIN)  IV 2 g (11/09/17 1226)  . famotidine (PEPCID) IV Stopped (11/09/17 1000)  . heparin 1,600 Units/hr (11/08/17 2221)  . methocarbamol (ROBAXIN)  IV    . metronidazole Stopped (11/09/17 0507)     LOS: 3 days     Vernell Leep, MD, FACP, [facility]. [facility] Pager [phone]  If 7PM-7AM, please contact night-coverage [email or web address] Password [facility] 11/09/2017, 12:47 PM

## 2017-11-09 NOTE — Care Management Note (Signed)
Case Management Note  Patient Details  Name: Chelsea Keller MRN: [id number] Date of Birth: August 29, 1934  Subjective/Objective:     Presents with N/V, abd pain,SB diverticulitis vs ileus vs psbo. Hx of acute PE / Eliquis,  CHF, chronic lower extremity venous insufficiency/edema, anxiety, depression, HTN, PUD, B12 deficiency. From home with family. PTA independent with ADL's , no DME usage.         Jovonne Wilton (Daughter)     [phone]       PCP:  Darien Ramus  Action/Plan: General surgery following.... PT evaluation pending. NCM following for disposition needs.  Expected Discharge Date:                  Expected Discharge Plan:  Home/Self Care  In-House Referral:     Discharge planning Services  CM Consult  Post Acute Care Choice:    Choice offered to:     DME Arranged:    DME Agency:     HH Arranged:    HH Agency:     Status of Service:  In process, will continue to follow  If discussed at Long Length of Stay Meetings, dates discussed:    Additional Comments:  Sharin Mons, RN 11/09/2017, 10:48 AM

## 2017-11-09 NOTE — Progress Notes (Signed)
[place] for Heparin  Indication: h/o extensive bilateral PE  (06/2017) pta Apixaban now on hold.   Allergies  Allergen Reactions  . Codeine Sulfate Shortness Of Breath  . Celecoxib Other (See Comments)    Caused vaginal bleeding  . Erythromycin Base Nausea And Vomiting    Patient Measurements: Height: 5' 2"  (157.5 cm) Weight: 261 lb 11 oz (118.7 kg) IBW/kg (Calculated) : 50.1 Heparin Dosing Weight: 79.5 kg  Vital Signs: Temp: 98 F (36.7 C) (05/07 0559) Temp Source: Oral (05/07 0559) BP: 127/64 (05/07 0559) Pulse Rate: 81 (05/07 0559)  Labs: Recent Labs    11/07/17 0926  11/07/17 1222 11/07/17 2105 11/08/17 0637 11/09/17 0344  HGB 8.8*  --   --   --  8.4* 8.5*  HCT 30.7*  --   --   --  29.0* 29.1*  PLT 315  --   --   --  315 320  APTT  --    < > 31 45* 78* 106*  HEPARINUNFRC  --   --  1.78*  --  1.26* 1.08*  CREATININE 0.98  --   --   --  0.99 0.94   < > = values in this interval not displayed.    Estimated Creatinine Clearance: 57.4 mL/min (by C-G formula based on SCr of 0.94 mg/dL).   Medical History: Past Medical History:  Diagnosis Date  . Allergy   . Anemia   . Anxiety   . Arthritis   . Bilateral pulmonary embolism ([place]) 06/23/2017  . Chronic diastolic CHF (congestive heart failure) (HCC)    a. Echo 1/16:  mild LVH, EF normal, grade 1 DD, MAC  . Chronic venous insufficiency    chronic LE edema  . Depression   . Fibromyalgia    constant pain  . Hx of cardiac catheterization    a. LHC in [place] "ok" per patient with mild plaque in a single vessel - records not available  . Hx of cardiovascular stress test    a. Nuclear study in 2008 normal  . Hx of colonic polyps   . Hypertension   . Hypertriglyceridemia   . Impaired fasting glucose   . PONV (postoperative nausea and vomiting)   . PUD (peptic ulcer disease)    hx of gastric ulcer  . Pulmonary emboli ([place]) 06/2017  . Vitamin B12 deficiency      Medications:  Medications Prior to Admission  Medication Sig Dispense Refill Last Dose  . acetaminophen (TYLENOL) 500 MG tablet Take 500 mg by mouth every 6 (six) hours as needed for headache (pain).   11/05/2017 at Unknown time  . apixaban (ELIQUIS) 5 MG TABS tablet Take 1 tablet (5 mg total) by mouth 2 (two) times daily. 30 tablet 0 11/06/2017 at 900  . furosemide (LASIX) 80 MG tablet Take 1 tablet (80 mg total) by mouth daily. (Patient taking differently: Take 160 mg by mouth daily. ) 30 tablet 6 couple days ago  . gabapentin (NEURONTIN) 100 MG capsule Take 100-200 mg by mouth See admin instructions. Tapering up to 300 mg three times daily: 5/4, 5/5, 5/6 - take one 300 mg capsule every morning and two capsules (200 mg)  at bedtime; 5/7, 5/8, 5/9 take one 300 mg capsule twice daily, 5/10, 5/11, 5/12, take one 300 mg capsule every morning and one 300 mg capsule with one capsule (100 mg) at bedtime, 5/13, 5/14, 5/15 - take 800 mg during the day (unknown directions), 5/16 and  going forward take one 300 mg capsule three times daily   11/05/2017 at pm  . gabapentin (NEURONTIN) 300 MG capsule Take 1 capsule (300 mg total) by mouth 3 (three) times daily. (Patient taking differently: Take 300 mg by mouth See admin instructions. Tapering up to 300 mg three times daily: 5/4, 5/5, 5/6 - take one capsule (300 mg) every morning and two 100 mg capsules at bedtime; 5/7, 5/8, 5/9 take one capsule (300 mg) twice daily, 5/10, 5/11, 5/12, take one capsule (300 mg) every morning and one capsule (300 mg) with a 100 mg capsule at bedtime, 5/13, 5/14, 5/15 - take 800 mg during the day (unknown directions), 5/16 and going forward take one capsule (300 mg) three times daily) 270 capsule 3 11/06/2017 at am  . potassium chloride SA (K-DUR,KLOR-CON) 20 MEQ tablet TAKE 2 TABLETS (40 MEQ TOTAL) DAILY 180 tablet 3 11/05/2017 at am  . triamterene-hydrochlorothiazide (MAXZIDE-25) 37.5-25 MG tablet Take 1 tablet by mouth daily. 90 tablet 3  11/05/2017 at am  . acetaminophen (TYLENOL) 325 MG tablet Take 2 tablets (650 mg total) by mouth every 6 (six) hours as needed for mild pain. (Patient not taking: Reported on 11/06/2017)   Not Taking at Unknown time   Scheduled:  . feeding supplement  1 Container Oral BID BM  . gabapentin  300 mg Oral BID  . lip balm  1 application Topical BID  . psyllium  1 packet Oral Daily  . saccharomyces boulardii  250 mg Oral BID    Assessment: 83 y.o female on Eliquis 5 mg BID PTA for hx PE (06/2017, extensive bilateral PE without right heart strain per CTA). Holding Eliquis and transitioned to heparin as may need procedure. Also with FOB positive on admit. Heparin level this am is still falsely elevated at 1.08 and aPTT slightly above goal at 106.  Hgb 8.5, plts wnl.  Goal of Therapy:  aPTT 66-102 seconds  Heparin level 0.3-0.7 units/ml Monitor platelets by anticoagulation protocol: Yes   Plan:  Reduce heparin gtt at 1,450 units/hr Monitor daily heparin level / aPTT, CBC, s/s of bleed F/U restart of Eliquis, may need SB resection  [place], Pharm.D., BCPS Clinical Pharmacist Pager: [phone] Clinical phone for 11/09/2017 from 8:30-4:00 is [phone]. After 4pm, please call Main Rx [phone]) for assistance. 11/09/2017 12:41 PM

## 2017-11-10 ENCOUNTER — Inpatient Hospital Stay (HOSPITAL_COMMUNITY): Payer: Medicare Other

## 2017-11-10 DIAGNOSIS — Z6841 Body Mass Index (BMI) 40.0 and over, adult: Secondary | ICD-10-CM

## 2017-11-10 LAB — APTT
APTT: 40 s — AB (ref 24–36)
aPTT: 64 seconds — ABNORMAL HIGH (ref 24–36)

## 2017-11-10 LAB — BASIC METABOLIC PANEL
Anion gap: 7 (ref 5–15)
BUN: 14 mg/dL (ref 6–20)
CHLORIDE: 103 mmol/L (ref 101–111)
CO2: 27 mmol/L (ref 22–32)
CREATININE: 0.91 mg/dL (ref 0.44–1.00)
Calcium: 8.6 mg/dL — ABNORMAL LOW (ref 8.9–10.3)
GFR calc Af Amer: >60 mL/min (ref >60)
GFR calc non Af Amer: 58 mL/min — ABNORMAL LOW (ref >60)
GLUCOSE: 125 mg/dL — AB (ref 65–99)
POTASSIUM: 4 mmol/L (ref 3.5–5.1)
Sodium: 137 mmol/L (ref 135–145)

## 2017-11-10 LAB — CBC
HEMATOCRIT: 30.1 % — AB (ref 36.0–46.0)
Hemoglobin: 8.6 g/dL — ABNORMAL LOW (ref 12.0–15.0)
MCH: 22.6 pg — AB (ref 26.0–34.0)
MCHC: 28.6 g/dL — AB (ref 30.0–36.0)
MCV: 79.2 fL (ref 78.0–100.0)
PLATELETS: 313 10*3/uL (ref 150–400)
RBC: 3.8 MIL/uL — ABNORMAL LOW (ref 3.87–5.11)
RDW: 19.2 % — AB (ref 11.5–15.5)
WBC: 13.1 10*3/uL — AB (ref 4.0–10.5)

## 2017-11-10 LAB — HEPARIN LEVEL (UNFRACTIONATED)
HEPARIN UNFRACTIONATED: 0.37 [IU]/mL (ref 0.30–0.70)
Heparin Unfractionated: 0.68 IU/mL (ref 0.30–0.70)

## 2017-11-10 MED ORDER — HYDROMORPHONE HCL 1 MG/ML IJ SOLN
1.0000 mg | INTRAMUSCULAR | Status: DC | PRN
  Filled 2017-11-10: qty 1

## 2017-11-10 MED ORDER — IOHEXOL 300 MG/ML  SOLN
100.0000 mL | Freq: Once | INTRAMUSCULAR | Status: AC | PRN
  Administered 2017-11-10: 100 mL via INTRAVENOUS

## 2017-11-10 MED ORDER — OXYCODONE HCL 5 MG PO TABS
5.0000 mg | ORAL_TABLET | ORAL | Status: DC | PRN
  Administered 2017-11-10 – 2017-11-12 (×4): 10 mg via ORAL
  Filled 2017-11-10 (×5): qty 2

## 2017-11-10 MED ORDER — IOPAMIDOL (ISOVUE-300) INJECTION 61%
INTRAVENOUS | Status: AC
  Administered 2017-11-10: 30 mL via ORAL
  Filled 2017-11-10: qty 30

## 2017-11-10 MED ORDER — KETOROLAC TROMETHAMINE 15 MG/ML IJ SOLN
15.0000 mg | Freq: Once | INTRAMUSCULAR | Status: AC
  Administered 2017-11-10: 15 mg via INTRAVENOUS
  Filled 2017-11-10: qty 1

## 2017-11-10 MED ORDER — HYDROCODONE-ACETAMINOPHEN 5-325 MG PO TABS: 1.0000 | ORAL_TABLET | ORAL | Status: DC | PRN

## 2017-11-10 MED ORDER — GABAPENTIN 300 MG PO CAPS
300.0000 mg | ORAL_CAPSULE | Freq: Three times a day (TID) | ORAL | Status: DC
  Administered 2017-11-10 – 2017-11-12 (×6): 300 mg via ORAL
  Filled 2017-11-10 (×6): qty 1

## 2017-11-10 MED ORDER — IOPAMIDOL (ISOVUE-300) INJECTION 61%
30.0000 mL | Freq: Once | INTRAVENOUS | Status: AC | PRN
  Administered 2017-11-10: 30 mL via ORAL

## 2017-11-10 NOTE — Progress Notes (Signed)
[place] for Heparin  Indication: h/o extensive bilateral PE (06/2017) pta Apixaban (holding)  Allergies  Allergen Reactions  . Codeine Sulfate Shortness Of Breath  . Celecoxib Other (See Comments)    Caused vaginal bleeding  . Erythromycin Base Nausea And Vomiting    Patient Measurements: Height: _0  (157.5 cm) Weight: 261 lb 11 oz (118.7 kg) IBW/kg (Calculated) : 50.1 Heparin Dosing Weight: 79.5 kg  Vital Signs: Temp: 99.3 F (37.4 C) (05/08 0559) Temp Source: Oral (05/08 0559) BP: 136/58 (05/08 0559) Pulse Rate: 88 (05/08 0559)  Labs: Recent Labs    11/08/17 0637 11/09/17 0344 11/10/17 0337  HGB 8.4* 8.5* 8.6*  HCT 29.0* 29.1* 30.1*  PLT 315 320 313  APTT 78* 106* 64*  HEPARINUNFRC 1.26* 1.08* 0.68  CREATININE 0.99 0.94 0.91    Estimated Creatinine Clearance: 59.3 mL/min (by C-G formula based on SCr of 0.91 mg/dL).   Medical History: Past Medical History:  Diagnosis Date  . Allergy   . Anemia   . Anxiety   . Arthritis   . Bilateral pulmonary embolism ([place]) 06/23/2017  . Chronic diastolic CHF (congestive heart failure) (HCC)    a. Echo 1/16:  mild LVH, EF normal, grade 1 DD, MAC  . Chronic venous insufficiency    chronic LE edema  . Depression   . Fibromyalgia    constant pain  . Hx of cardiac catheterization    a. LHC in [place] "ok" per patient with mild plaque in a single vessel - records not available  . Hx of cardiovascular stress test    a. Nuclear study in 2008 normal  . Hx of colonic polyps   . Hypertension   . Hypertriglyceridemia   . Impaired fasting glucose   . PONV (postoperative nausea and vomiting)   . PUD (peptic ulcer disease)    hx of gastric ulcer  . Pulmonary emboli ([place]) 06/2017  . Vitamin B12 deficiency     Medications:  Medications Prior to Admission  Medication Sig Dispense Refill Last Dose  . acetaminophen (TYLENOL) 500 MG tablet Take 500 mg by mouth every 6 (six) hours as needed  for headache (pain).   11/05/2017 at Unknown time  . apixaban (ELIQUIS) 5 MG TABS tablet Take 1 tablet (5 mg total) by mouth 2 (two) times daily. 30 tablet 0 11/06/2017 at 900  . furosemide (LASIX) 80 MG tablet Take 1 tablet (80 mg total) by mouth daily. (Patient taking differently: Take 160 mg by mouth daily. ) 30 tablet 6 couple days ago  . gabapentin (NEURONTIN) 100 MG capsule Take 100-200 mg by mouth See admin instructions. Tapering up to 300 mg three times daily: 5/4, 5/5, 5/6 - take one 300 mg capsule every morning and two capsules (200 mg)  at bedtime; 5/7, 5/8, 5/9 take one 300 mg capsule twice daily, 5/10, 5/11, 5/12, take one 300 mg capsule every morning and one 300 mg capsule with one capsule (100 mg) at bedtime, 5/13, 5/14, 5/15 - take 800 mg during the day (unknown directions), 5/16 and going forward take one 300 mg capsule three times daily   11/05/2017 at pm  . gabapentin (NEURONTIN) 300 MG capsule Take 1 capsule (300 mg total) by mouth 3 (three) times daily. (Patient taking differently: Take 300 mg by mouth See admin instructions. Tapering up to 300 mg three times daily: 5/4, 5/5, 5/6 - take one capsule (300 mg) every morning and two 100 mg capsules at bedtime; 5/7, 5/8,  5/9 take one capsule (300 mg) twice daily, 5/10, 5/11, 5/12, take one capsule (300 mg) every morning and one capsule (300 mg) with a 100 mg capsule at bedtime, 5/13, 5/14, 5/15 - take 800 mg during the day (unknown directions), 5/16 and going forward take one capsule (300 mg) three times daily) 270 capsule 3 11/06/2017 at am  . potassium chloride SA (K-DUR,KLOR-CON) 20 MEQ tablet TAKE 2 TABLETS (40 MEQ TOTAL) DAILY 180 tablet 3 11/05/2017 at am  . triamterene-hydrochlorothiazide (MAXZIDE-25) 37.5-25 MG tablet Take 1 tablet by mouth daily. 90 tablet 3 11/05/2017 at am  . acetaminophen (TYLENOL) 325 MG tablet Take 2 tablets (650 mg total) by mouth every 6 (six) hours as needed for mild pain. (Patient not taking: Reported on 11/06/2017)   Not  Taking at Unknown time   Scheduled:  . feeding supplement  1 Container Oral BID BM  . gabapentin  300 mg Oral BID  . lip balm  1 application Topical BID  . psyllium  1 packet Oral Daily  . saccharomyces boulardii  250 mg Oral BID    Assessment: 83 y.o female on Eliquis 5 mg BID PTA for hx PE (06/2017, extensive bilateral PE without right heart strain per CTA). Holding Eliquis and transitioned to heparin as may need procedure. Also with FOB positive on admit - Surgery recommended heparin for now. Heparin level this am is still falsely elevated at 0.68 and aPTT slightly below goal after rate decrease at 64. Hg low but stable - transfused 5/5, plt wnl, no overt s/sx of bleed or IV line issues per RN.  Goal of Therapy:  aPTT 66-102 seconds  Heparin level 0.3-0.7 units/ml Monitor platelets by anticoagulation protocol: Yes   Plan:  Increase heparin gtt to 1550 units/hr 8h aPTT Monitor daily heparin level / aPTT, CBC, s/s of bleed F/U restart of Eliquis as appropriate  Elicia Lamp, PharmD, BCPS Clinical Pharmacist Clinical phone for 11/10/2017 until 3:30pm: [phone] If after 3:30pm, please call main pharmacy at: [phone] 11/10/2017 10:30 AM

## 2017-11-10 NOTE — Telephone Encounter (Signed)
Spoke to daughter, [name], per [place].

## 2017-11-10 NOTE — Progress Notes (Signed)
Patient refused to ambulate as ordered. Patient stated she has chronic hip and lower back pain. Patient is upset because she states "the doctors hasn't addressed the hip pain since her last admission".  Administered the prn dose of morphine and will continue to monitor the patient.

## 2017-11-10 NOTE — Progress Notes (Signed)
Orthopedic Trauma Service Progress Note   Patient ID: Genessis Flanary MRN: [id number] DOB/AGE: 01/21/1935 83 y.o.  Subjective:  Pt longtime patient of OTS   Contacted by primary service as pt c/o exacerbation of chronic arthritic conditions particularly of her back and hips Pt refusing to work with PT/OT and not getting out of bed   Pt c/o B hip pain L>R  Pain radiates to top of thighs   Minimal radiation to Low back but there is some  Denies any new numbness or tingling   Pt awaiting repeat CT of her abd this afternoon as well   At baseline pt ambulates with a walker. Her gain is very slow and antalgic  When using the walker she leans forward significantly as well   MRIs of her L hip and low back from 6 months ago were reviewed: chronic degenerative changes noted throughout. B hip djd, B SI joint arthritis. Mild Lumbar DJD noted. See reports for full summary       ROS As above Objective:   VITALS:   Vitals:   11/09/17 2129 11/09/17 2225 11/10/17 0559 11/10/17 1334  BP: (!) 139/53  (!) 136/58 (!) 152/73  Pulse: 81  88 90  Resp: 18  18 16   Temp: (!) 101 F (38.3 C) 99.3 F (37.4 C) 99.3 F (37.4 C) 97.9 F (36.6 C)  TempSrc: Axillary Oral Oral   SpO2: 96%  94% 97%  Weight:      Height:        Estimated body mass index is 47.86 kg/m as calculated from the following:   Height as of this encounter: 5\' 2"  (1.575 m).   Weight as of this encounter: 118.7 kg (261 lb 11 oz).   Intake/Output      05/07 0701 - 05/08 0700 05/08 0701 - 05/09 0700   P.O. 360 380   I.V. (mL/kg) 321.1 (2.7)    IV Piggyback     Total Intake(mL/kg) 681.1 (5.7) 380 (3.2)   Urine (mL/kg/hr) 500 (0.2) 675 (0.7)   Total Output 500 675   Net +181.1 -295        Urine Occurrence 1 x      LABS  Results for orders placed or performed during the hospital encounter of 11/06/17 (from the past 24 hour(s))  CBC     Status: Abnormal   Collection Time: 11/10/17  3:37 AM   Result Value Ref Range   WBC 13.1 (H) 4.0 - 10.5 K/uL   RBC 3.80 (L) 3.87 - 5.11 MIL/uL   Hemoglobin 8.6 (L) 12.0 - 15.0 g/dL   HCT 30.1 (L) 36.0 - 46.0 %   MCV 79.2 78.0 - 100.0 fL   MCH 22.6 (L) 26.0 - 34.0 pg   MCHC 28.6 (L) 30.0 - 36.0 g/dL   RDW 19.2 (H) 11.5 - 15.5 %   Platelets 313 150 - 400 K/uL  APTT     Status: Abnormal   Collection Time: 11/10/17  3:37 AM  Result Value Ref Range   aPTT 64 (H) 24 - 36 seconds  Heparin level (unfractionated)     Status: None   Collection Time: 11/10/17  3:37 AM  Result Value Ref Range   Heparin Unfractionated 0.68 0.30 - 0.70 IU/mL  Basic metabolic panel     Status: Abnormal   Collection Time: 11/10/17  3:37 AM  Result Value Ref Range   Sodium 137 135 - 145 mmol/L   Potassium 4.0 3.5 - 5.1 mmol/L   Chloride 103  101 - 111 mmol/L   CO2 27 22 - 32 mmol/L   Glucose, Bld 125 (H) 65 - 99 mg/dL   BUN 14 6 - 20 mg/dL   Creatinine, Ser 0.91 0.44 - 1.00 mg/dL   Calcium 8.6 (L) 8.9 - 10.3 mg/dL   GFR calc non Af Amer 58 (L) >60 mL/min   GFR calc Af Amer >60 >60 mL/min   Anion gap 7 5 - 15     PHYSICAL EXAM:   Gen: lying in bed with HOB minimally elevated. She does appear uncomfortable  Pelvis: no significant pain with lateral compression of pelvis              No pelvic instability  Ext:       B Lower Extremities              Chronic edema looks best I have seen in quite some time   + pain with axial loading and log rolling of hips B   Pt unable to perform SLR B   Restricted IR of hips B   DPN, SPN, TN sensation intact B   EHL, FHL, AT, PT, peroneals, gastroc motor intact B   + DP pulse   Pt does not tolerate attempts to get Hip in FABER position   Unable to perform stinchfield maneuver as pt can not actively flex hip with extended knee   Assessment/Plan:     Principal Problem:   Diverticulitis of small intestine with perforation Active Problems:   Essential hypertension   BMI 50.0-59.9, adult (HCC)   Chronic diastolic  congestive heart failure (HCC)   Anemia   CKD (chronic kidney disease) stage 3, GFR 30-59 ml/min (HCC)   Small bowel obstruction (HCC)   History of pulmonary embolism   Chronic anticoagulation   Anti-infectives (From admission, onward)   Start     Dose/Rate Route Frequency Ordered Stop   11/08/17 1300  metroNIDAZOLE (FLAGYL) IVPB 500 mg     500 mg 100 mL/hr over 60 Minutes Intravenous Every 8 hours 11/08/17 0836     11/07/17 1100  metroNIDAZOLE (FLAGYL) IVPB 500 mg  Status:  Discontinued     500 mg 100 mL/hr over 60 Minutes Intravenous Every 6 hours 11/07/17 0918 11/08/17 0836   11/07/17 1000  cefTRIAXone (ROCEPHIN) 2 g in sodium chloride 0.9 % 100 mL IVPB    Note to Pharmacy:  Pharmacy may adjust dosing strength / duration / interval for maximal efficacy   2 g 200 mL/hr over 30 Minutes Intravenous Every 24 hours 11/07/17 0918      .  83 y/o female admitted for Small Bowel diverticulitis vs ileus vs SBO on 11/06/2017 with further deconditioning and acute arthritic flares   - chronic poly-arthralgias due to arthritis, severe deconditioning   Clinical exam and history favor hip arthritis as the primary culprit but pt also has chronic SI joint arthritis as well which is likely contributory   I do think fluoroscopy guided steroid injections to both hips during this admission is warranted given her profound immobility    I do not think that escalating her opioids is safe due to her active GI issues    Pt has received steroid injections in the past with relief and we have instructed the patient that she would likely benefit from THA but this was several years ago and her medical condition has worsened since that time.     Fluoro guided steroid injection will be diagnostic and therapeutic in that  if her pain is alleviated then we know all her pathology is coming from her hips. If it is not then in all probability her back is the main issue     Pt is in agreement with proceeding with this      Will get this scheduled. We will need to arrange for her heparin to be stopped around the time of injection as well     I did order a 1x dose of toradol to see if this helps with symptoms.  Did consider celebrex as it is [employer] 2 selective but given her GI issues I opted for something IV.      - Dispo:  Per medicine and general surgery   Jari Pigg, PA-C [facility] [phone] Levi Aland (C) 11/10/2017, 3:33 PM

## 2017-11-10 NOTE — Progress Notes (Signed)
[facility] [place] for Heparin  Indication: h/o extensive bilateral PE (06/2017) pta Apixaban (holding)  Allergies  Allergen Reactions  . Codeine Sulfate Shortness Of Breath  . Celecoxib Other (See Comments)    Caused vaginal bleeding  . Erythromycin Base Nausea And Vomiting    Patient Measurements: Height: 5\' 2"  (157.5 cm) Weight: 261 lb 11 oz (118.7 kg) IBW/kg (Calculated) : 50.1 Heparin Dosing Weight: 79.5 kg  Vital Signs: Temp: 97.9 F (36.6 C) (05/08 1334) BP: 152/73 (05/08 1334) Pulse Rate: 90 (05/08 1334)  Labs: Recent Labs    11/08/17 0637 11/09/17 0344 11/10/17 0337 11/10/17 1852  HGB 8.4* 8.5* 8.6*  --   HCT 29.0* 29.1* 30.1*  --   PLT 315 320 313  --   APTT 78* 106* 64* 40*  HEPARINUNFRC 1.26* 1.08* 0.68 0.37  CREATININE 0.99 0.94 0.91  --     Estimated Creatinine Clearance: 59.3 mL/min (by C-G formula based on SCr of 0.91 mg/dL).  Assessment: 83 y.o female on Eliquis 5 mg BID PTA for hx PE (06/2017, extensive bilateral PE without right heart strain per CTA). Holding Eliquis and transitioned to heparin as may need procedure. Also with FOB positive on admit - Surgery recommended heparin for now.   Heparin level is therapeutic at 0.37 and aPTT is below goal at 40 sec. Appears heparin level is now correlating and tends to be more accurate. No bleeding noted.  Goal of Therapy:  Heparin level 0.3-0.7 units/ml Monitor platelets by anticoagulation protocol: Yes   Plan:  Continue heparin drip at 1550 units/hr Monitor daily heparin level, CBC, s/s of bleed F/U restart of Eliquis as appropriate   Renold Genta, PharmD, BCPS Clinical Pharmacist Clinical phone for 11/10/2017 until 10p is [phone] After 10p, please call Main Rx at [phone] for assistance 11/10/2017 8:40 PM

## 2017-11-10 NOTE — Progress Notes (Signed)
OT Cancellation Note  Patient Details Name: Chelsea Keller MRN: [id number] DOB: 04/07/1935   Cancelled Treatment:    Reason Eval/Treat Not Completed: Pain limiting ability to participate. Pt adamantly refusing activity reporting pain in her hip and plan to go for xray. Educated in benefits of OOB, pt stating, " I can't, don't you people get it!" Provided pt blanket for comfort. Will follow.  [name] 11/10/2017, 8:57 AM  11/10/2017 Nestor Lewandowsky, OTR/L Pager: [phone]

## 2017-11-10 NOTE — NC FL2 (Signed)
[place] MEDICAID FL2 LEVEL OF CARE SCREENING TOOL     IDENTIFICATION  Patient Name: Chelsea Keller Birthdate: 04/23/1935 Sex: female Admission Date (Current Location): 11/06/2017  [facility] [place] Number:  Herbalist and Address:  [place], [place] [address], [place],  [address]      Provider Number: [id number]  Attending Physician Name and Address:  Mendel Corning, MD  Relative Name and Phone Number:  [name] daughter, [phone]    Current Level of Care: Hospital Recommended Level of Care: [place] Prior Approval Number:    Date Approved/Denied:   PASRR Number: [id number]  Discharge Plan: SNF    Current Diagnoses: Patient Active Problem List   Diagnosis Date Noted  . History of pulmonary embolism 11/07/2017  . Chronic anticoagulation 11/07/2017  . Diverticulitis of small intestine with perforation 11/07/2017  . Small bowel obstruction ([place]) 11/06/2017  . Neurodermatitis 07/28/2017  . Urinary incontinence 05/26/2017  . Intractable pain-left hip 04/03/2017  . Fibromyalgia 04/03/2017  . Anemia 04/03/2017  . Chronic diastolic CHF (congestive heart failure) ([place]) 04/03/2017  . Hypertension 04/03/2017  . Hypertriglyceridemia 04/03/2017  . Vitamin B12 deficiency 04/03/2017  . CKD (chronic kidney disease) stage 3, GFR 30-59 ml/min (HCC) 04/03/2017  . Acute hypokalemia 04/03/2017  . Left sided sciatica   . Advance directive discussed with patient 01/19/2017  . Chronic diastolic congestive heart failure ([place]) 05/09/2014  . Mood disorder ([place]) 11/03/2013  . Knee joint pain 10/31/2013  . Psoriasis 02/21/2013  . Routine general medical examination at a health care facility 06/14/2012  . BMI 50.0-59.9, adult ([place]) 12/02/2011  . VENTRICULAR HYPERTROPHY, LEFT 10/19/2008  . Sleep disturbance 10/25/2007  . Venous (peripheral) insufficiency 08/11/2007  . Osteoarthritis, multiple sites 08/11/2007  . Impaired  fasting glucose 08/11/2007  . COLONIC POLYPS, HX OF 05/20/2007  . B12 DEFICIENCY 09/30/2006  . Essential hypertension 09/30/2006  . Allergic rhinitis due to pollen 09/30/2006    Orientation RESPIRATION BLADDER Height & Weight     Self, Time, Situation, Place  Normal Incontinent, External catheter Weight: 118.7 kg (261 lb 11 oz) Height:  5\' 2"  (157.5 cm)  BEHAVIORAL SYMPTOMS/MOOD NEUROLOGICAL BOWEL NUTRITION STATUS      Continent Diet(Please see DC Summary)  AMBULATORY STATUS COMMUNICATION OF NEEDS Skin   Extensive Assist Verbally Normal                       Personal Care Assistance Level of Assistance  Bathing, Feeding, Dressing Bathing Assistance: Limited assistance Feeding assistance: Independent Dressing Assistance: Limited assistance     Functional Limitations Info  Sight, Hearing, Speech Sight Info: Adequate Hearing Info: Adequate Speech Info: Adequate    SPECIAL CARE FACTORS FREQUENCY  PT (By licensed PT), OT (By licensed OT)     PT Frequency: 5x/week OT Frequency: 3x/week            Contractures Contractures Info: Not present    Additional Factors Info  Code Status, Allergies Code Status Info: Full Allergies Info: Codeine Sulfate, Celecoxib, Erythromycin Base           Current Medications (11/10/2017):  This is the current hospital active medication list Current Facility-Administered Medications  Medication Dose Route Frequency Provider Last Rate Last Dose  . acetaminophen (TYLENOL) tablet 325-650 mg  325-650 mg Oral Q6H PRN Hongalgi, Lenis Dickinson, MD      . alum & mag hydroxide-simeth (MAALOX/MYLANTA) 200-200-20 MG/5ML suspension 30 mL  30 mL Oral Q6H PRN  Michael Boston, MD      . bisacodyl (DULCOLAX) suppository 10 mg  10 mg Rectal Q12H PRN Michael Boston, MD      . cefTRIAXone (ROCEPHIN) 2 g in sodium chloride 0.9 % 100 mL IVPB  2 g Intravenous Q24H Michael Boston, MD   Stopped at 11/09/17 1256  . famotidine (PEPCID) IVPB 20 mg premix  20 mg  Intravenous Q12H Gareth Morgan, MD 100 mL/hr at 11/10/17 0912 20 mg at 11/10/17 0912  . feeding supplement (BOOST / RESOURCE BREEZE) liquid 1 Container  1 Container Oral BID BM Greer Pickerel, MD   1 Container at 11/09/17 1405  . gabapentin (NEURONTIN) capsule 300 mg  300 mg Oral BID Modena Jansky, MD   300 mg at 11/10/17 0912  . guaiFENesin-dextromethorphan (ROBITUSSIN DM) 100-10 MG/5ML syrup 10 mL  10 mL Oral Q4H PRN Michael Boston, MD      . heparin ADULT infusion 100 units/mL (25000 units/24mL sodium chloride 0.45%)  1,550 Units/hr Intravenous Continuous Romona Curls, RPH 15.5 mL/hr at 11/10/17 1055 1,550 Units/hr at 11/10/17 1055  . hydrocortisone (ANUSOL-HC) 2.5 % rectal cream 1 application  1 application Topical QID PRN Michael Boston, MD      . hydrocortisone cream 1 % 1 application  1 application Topical TID PRN Michael Boston, MD      . lip balm (BLISTEX) ointment 1 application  1 application Topical BID Michael Boston, MD   1 application at 67/61/95 [phone]  . magic mouthwash  15 mL Oral QID PRN Michael Boston, MD      . menthol-cetylpyridinium (CEPACOL) lozenge 3 mg  1 lozenge Oral PRN Michael Boston, MD      . methocarbamol (ROBAXIN) tablet 1,000 mg  1,000 mg Oral Q6H PRN Skeet Simmer, RPH       Or  . methocarbamol (ROBAXIN) 500 mg in dextrose 5 % 50 mL IVPB  500 mg Intravenous Q6H PRN Skeet Simmer, RPH      . metroNIDAZOLE (FLAGYL) IVPB 500 mg  500 mg Intravenous Q8H Reginia Naas, [facility]   Stopped at 11/10/17 6712  . morphine 2 MG/ML injection 2 mg  2 mg Intravenous Q3H PRN Elwyn Reach, MD   2 mg at 11/10/17 0912  . ondansetron (ZOFRAN) tablet 4 mg  4 mg Oral Q6H PRN Elwyn Reach, MD       Or  . ondansetron (ZOFRAN) injection 4 mg  4 mg Intravenous Q6H PRN Elwyn Reach, MD   4 mg at 11/07/17 1419  . phenol (CHLORASEPTIC) mouth spray 1-2 spray  1-2 spray Mouth/Throat PRN Michael Boston, MD      . prochlorperazine (COMPAZINE) injection 5-10 mg  5-10 mg  Intravenous Q4H PRN Michael Boston, MD      . psyllium (HYDROCIL/METAMUCIL) packet 1 packet  1 packet Oral Daily Michael Boston, MD      . saccharomyces boulardii (FLORASTOR) capsule 250 mg  250 mg Oral BID Michael Boston, MD   250 mg at 11/10/17 4580     Discharge Medications: Please see discharge summary for a list of discharge medications.  Relevant Imaging Results:  Relevant Lab Results:   Additional Information SS#: [id number]  Benard Halsted, LCSWA

## 2017-11-10 NOTE — Progress Notes (Signed)
[facility]                                                                              Patient Demographics  Chelsea Keller, is a 83 y.o. female, DOB - [birth year], YCX:[id number]  Admit date - 11/06/2017   Admitting Physician Elwyn Reach, MD  Outpatient Primary MD for the patient is Venia Carbon, MD  Outpatient specialists:   LOS - 4  days   Medical records reviewed and are as summarized below:    Chief Complaint  Patient presents with  . Chest Pain  . Abdominal Pain  . Back Pain       Brief summary   83 year old female, lives with her daughter and 3 adult grand sons, independent, PMH of acute PE (extensive bilateral PE without right heart strain by CTA chest 06/23/2017) on Eliquis since, chronic diastolic CHF, chronic lower extremity venous insufficiency/edema, anxiety, depression, HTN, PUD, B12 deficiency, tented to [facility] ED on 5/4 with nausea and abdominal pain.  CT abdomen 5/4 suggestive of PSBO.  General surgery consulted and suspect jejunal diverticulitis with microperforation and PSBO.   Assessment & Plan    Principal Problem:   Diverticulitis of small intestine with micro perforation versus PSBO versus ileus -Appreciate general surgery following, patient was treated supportively with bowel rest, diet advanced on 5/7. -Placed on IV Rocephin and Flagyl, abdominal x-ray 5/6 showed no perforation or high-grade obstruction but mildly distended small bowel loops. -Discussed with Dr. Redmond Pulling, plan on repeating CT abdomen again due to fever 101 F and leukocytosis.  Active Problems: Chronic low back pain, hip pain -Patient continues to refuse physical therapy, skilled nursing facilities for rehab due to pain in her back and hips -Lumbar spine x-ray showed moderate multilevel degenerative disc disease, hip x-rays normal. -Discussed with orthopedics, Ainsley Spinner, patient follows Dr. Marcelino Scot.  Possible ESI injection.  Continue pain control.  Encourage  PT    Essential hypertension -Currently controlled, holding HCTZ    BMI 50.0-59.9, adult ([place]) - Counseled on diet and weight control   chronic diastolic congestive heart failure (HCC) -Currently stable, compensated    Anemia -Possibly iron deficiency anemia secondary to slow chronic GI blood loss, presented with hemoglobin of 7.5, no overt bleeding.  FOBT positive. -EGD 10/2014 unremarkable except for mild gastritis and colonoscopy with multiple polyps, diverticulosis and internal hemorrhoids. -Due to current diverticulitis with microperforation versus PSBO, no acute GI interventions, patient will follow-up with GI outpatient -H&H stable    CKD (chronic kidney disease) stage 3, GFR 30-59 ml/min (HCC) -Creatinine currently stable 0.9    History of pulmonary embolism on  Chronic anticoagulation -Diagnosed in December 2018, felt unprovoked however patient has sedentary lifestyle.  No DVT or right heart strain on echo, has been on Eliquis since. -Currently on IV heparin in case the surgery is warranted or bleeding complications. -If no acute issues, will continue Eliquis at the time of discharge  Chronic lower extremity venous insufficiency/edema Increase gabapentin to 300 mg 3 times daily  Code Status: CODE STATUS DVT Prophylaxis:   heparin  Family Communication: Discussed in detail with the patient, all imaging results, lab  results explained to the patient    Disposition Plan: Once medically stable  Time Spent in minutes   35 minutes  Procedures:  CT abdomen  Consultants:   Orthopedics, general surgery  Antimicrobials:   IV ceftriaxone, Flagyl   Medications  Scheduled Meds: . feeding supplement  1 Container Oral BID BM  . gabapentin  300 mg Oral BID  . lip balm  1 application Topical BID  . psyllium  1 packet Oral Daily  . saccharomyces boulardii  250 mg Oral BID   Continuous Infusions: . cefTRIAXone (ROCEPHIN)  IV 2 g (11/10/17 1057)  . famotidine (PEPCID) IV  Stopped (11/10/17 0945)  . heparin 1,550 Units/hr (11/10/17 1055)  . methocarbamol (ROBAXIN)  IV    . metronidazole Stopped (11/10/17 0606)   PRN Meds:.acetaminophen, alum & mag hydroxide-simeth, bisacodyl, guaiFENesin-dextromethorphan, HYDROcodone-acetaminophen, hydrocortisone, hydrocortisone cream, magic mouthwash, menthol-cetylpyridinium, methocarbamol **OR** methocarbamol (ROBAXIN)  IV, morphine injection, ondansetron **OR** ondansetron (ZOFRAN) IV, phenol, prochlorperazine   Antibiotics   Anti-infectives (From admission, onward)   Start     Dose/Rate Route Frequency Ordered Stop   11/08/17 1300  metroNIDAZOLE (FLAGYL) IVPB 500 mg     500 mg 100 mL/hr over 60 Minutes Intravenous Every 8 hours 11/08/17 0836     11/07/17 1100  metroNIDAZOLE (FLAGYL) IVPB 500 mg  Status:  Discontinued     500 mg 100 mL/hr over 60 Minutes Intravenous Every 6 hours 11/07/17 0918 11/08/17 0836   11/07/17 1000  cefTRIAXone (ROCEPHIN) 2 g in sodium chloride 0.9 % 100 mL IVPB    Note to Pharmacy:  Pharmacy may adjust dosing strength / duration / interval for maximal efficacy   2 g 200 mL/hr over 30 Minutes Intravenous Every 24 hours 11/07/17 2992          Subjective:   Chelsea Keller was seen and examined today.  Complaining of bilateral hip pain, back pain, acute on chronic.  No focal weakness or urinary retention or incontinence. Patient denies dizziness, chest pain, shortness of breath, abdominal pain, N/V/D/C, new weakness.  Feels deconditioned and global weakness.  No acute events overnight.    Objective:   Vitals:   11/09/17 1330 11/09/17 2129 11/09/17 2225 11/10/17 0559  BP: (!) 135/48 (!) 139/53  (!) 136/58  Pulse: 82 81  88  Resp: 16 18  18   Temp: 98.1 F (36.7 C) (!) 101 F (38.3 C) 99.3 F (37.4 C) 99.3 F (37.4 C)  TempSrc: Oral Axillary Oral Oral  SpO2:  96%  94%  Weight:      Height:        Intake/Output Summary (Last 24 hours) at 11/10/2017 1209 Last data filed at 11/10/2017  0900 Gross per 24 hour  Intake 420 ml  Output 375 ml  Net 45 ml     Wt Readings from Last 3 Encounters:  11/06/17 118.7 kg (261 lb 11 oz)  10/25/17 119.3 kg (263 lb)  09/15/17 119.4 kg (263 lb 3.2 oz)     Exam  General: Alert and oriented x 3, NAD  Eyes:   HEENT:  Atraumatic, normocephali  Cardiovascular: S1 S2 auscultated,  Regular rate and rhythm.  Respiratory: Clear to auscultation bilaterally, no wheezing, rales or rhonchi  Gastrointestinal: Soft, nontender, nondistended, + bowel sounds  Ext: no pedal edema bilaterally  Neuro: no focal weakness, feels generalized weakness  Musculoskeletal: No digital cyanosis, clubbing  Skin: No rashes  Psych: Normal affect and demeanor, alert and oriented x3    Data Reviewed:  I  have personally reviewed following labs and imaging studies  Micro Results No results found for this or any previous visit (from the past 240 hour(s)).  Radiology Reports Dg Chest 2 View  Result Date: 11/06/2017 CLINICAL DATA:  Central chest pain. Abdominal pain since 4 a.m. Tingling in the hands. EXAM: CHEST - 2 VIEW COMPARISON:  None. FINDINGS: Heart is enlarged. There is no edema or effusion to suggest failure. Aortic atherosclerotic changes are present. Degenerative changes are again noted in the thoracic spine. Vertebral body heights are maintained. Surgical clips are present at the gallbladder fossa. IMPRESSION: 1. Stable cardiomegaly without failure. 2. Aortic atherosclerosis. 3. No acute cardiopulmonary disease. Electronically Signed   By: San Morelle M.D.   On: 11/06/2017 15:37   Dg Lumbar Spine Complete  Result Date: 11/10/2017 CLINICAL DATA:  Low back pain without known injury. EXAM: LUMBAR SPINE - COMPLETE 4+ VIEW COMPARISON:  CT scan of Nov 06, 2017. FINDINGS: No fracture or significant spondylolisthesis is noted. Osteopenia is noted. Moderate degenerative disc disease is noted at L4-5 and L5-S1. Anterior osteophyte formation is  noted at L2-3 and L3-4. Atherosclerosis of abdominal aorta is noted. Degenerative changes seen involving posterior facet joints of L4-5 and L5-S1 bilaterally. IMPRESSION: Moderate multilevel degenerative disc disease. No acute abnormality seen in the lumbar spine. Electronically Signed   By: Marijo Conception, M.D.   On: 11/10/2017 10:22   Dg Ankle 2 Views Right  Result Date: 10/26/2017 CLINICAL DATA:  Pain at the lateral malleolus EXAM: RIGHT ANKLE - 2 VIEW COMPARISON:  None. FINDINGS: Diffuse soft tissue edema and scattered calcifications. No fracture or malalignment. Small plantar calcaneal spur. Minimal medial and lateral degenerative changes. No soft tissue gas. IMPRESSION: Soft tissue edema without acute osseous abnormality. Electronically Signed   By: Donavan Foil M.D.   On: 10/26/2017 14:29   Ct Abdomen Pelvis W Contrast  Result Date: 11/06/2017 CLINICAL DATA:  General abdominal pain radiates into chest. EXAM: CT ABDOMEN AND PELVIS WITH CONTRAST TECHNIQUE: Multidetector CT imaging of the abdomen and pelvis was performed using the standard protocol following bolus administration of intravenous contrast. CONTRAST:  112mL OMNIPAQUE IOHEXOL 300 MG/ML  SOLN COMPARISON:  None. FINDINGS: Lower chest: Lung bases are clear. Hepatobiliary: No focal hepatic lesion. Postcholecystectomy. No biliary dilatation. Pancreas: Pancreas is normal. No ductal dilatation. No pancreatic inflammation. Spleen: Normal spleen Adrenals/urinary tract: Adrenal glands and kidneys are normal. The ureters and bladder normal. Stomach/Bowel: The stomach duodenum normal. There is a caliber change from the jejunum to the ileum with decompressed ileum; however the jejunum is only minimally distended to 3 cm which is upper limits of normal. No obstructing lesion identified. No mass lesion identified. No pneumatosis. There is a focus of potential extraluminal gas adjacent to the small bowel (image 84/7). This could represent inflamed small  bowel diverticulum. Seen on image 83/7 and image 49/4. Terminal ileum is normal. Appendix not identified. The colon is collapsed. Several diverticula sigmoid colon. Vascular/Lymphatic: Abdominal aorta is normal caliber with atherosclerotic calcification. There is no retroperitoneal or periportal lymphadenopathy. No pelvic lymphadenopathy. The SMA and celiac trunk Sir open. Reproductive: Post hysterectomy Other: No free fluid. Musculoskeletal: No aggressive osseous lesion. IMPRESSION: 1. Small bowel caliber transition from jejunum to the ileum with minimal distention of the proximal small bowel suggest partial or intermittent obstruction. There is a focus of inflammation and potential small bowel inflamed diverticulum centrally in the mesentery of the mid small bowel. No high-grade obstructing lesion identified. No pneumatosis or portal venous  gas. The colon is collapsed. 2. Atherosclerotic calcification the aorta and branches. The SMA, celiac trunk and IMA are patent. 3. Postcholecystectomy. Electronically Signed   By: Suzy Bouchard M.D.   On: 11/06/2017 17:19   Dg Abd Acute W/chest  Result Date: 11/08/2017 CLINICAL DATA:  Abdominal and chest discomfort but no pain. Abdominal ileus. Small bowel inflammatory changes on earlier CT. EXAM: DG ABDOMEN ACUTE W/ 1V CHEST COMPARISON:  Chest x-ray dated Nov 06, 2017 FINDINGS: The lungs are well-expanded. The interstitial markings are increased. The cardiac silhouette is enlarged and the pulmonary vascularity is mildly engorged. There is no pleural effusion. There is calcification in the wall of the aortic arch. There is severe degenerative change of the right shoulder. Within the abdomen there is a moderate amount of gas within normal caliber small bowel loops with several loops of distended small bowel in the mid abdomen. The stool and gas pattern in the colon and rectum is normal. The observed bony structures are unremarkable. IMPRESSION: No evidence of perforation or  high-grade obstruction. There is mild distention of several small bowel loops in the mid abdomen. Chest x-ray findings worrisome for mild CHF. Thoracic aortic atherosclerosis. Electronically Signed   By: David  Martinique M.D.   On: 11/08/2017 10:24   Dg Hips Bilat With Pelvis 2v  Result Date: 11/10/2017 CLINICAL DATA:  Bilateral hip pain without known injury. EXAM: DG HIP (WITH OR WITHOUT PELVIS) 2V BILAT COMPARISON:  None. FINDINGS: There is no evidence of hip fracture or dislocation. There is no evidence of arthropathy or other focal bone abnormality. IMPRESSION: Normal bilateral hips. Electronically Signed   By: Marijo Conception, M.D.   On: 11/10/2017 10:20    Lab Data:  CBC: Recent Labs  Lab 11/06/17 1440 11/07/17 0926 11/08/17 0637 11/09/17 0344 11/10/17 0337  WBC 9.7 10.2 11.6* 11.5* 13.1*  HGB 7.5* 8.8* 8.4* 8.5* 8.6*  HCT 26.6* 30.7* 29.0* 29.1* 30.1*  MCV 75.8* 79.5 78.4 79.3 79.2  PLT 386 315 315 320 096   Basic Metabolic Panel: Recent Labs  Lab 11/06/17 1440 11/07/17 0926 11/08/17 0637 11/09/17 0344 11/10/17 0337  NA 138 139 137 137 137  K 3.4* 3.4* 3.7 3.3* 4.0  CL 97* 103 103 101 103  CO2 28 27 26 27 27   GLUCOSE 137* 114* 106* 96 125*  BUN 27* 19 15 14 14   CREATININE 1.02* 0.98 0.99 0.94 0.91  CALCIUM 9.1 8.6* 8.5* 8.6* 8.6*  MG  --   --  1.6* 1.8  --    GFR: Estimated Creatinine Clearance: 59.3 mL/min (by C-G formula based on SCr of 0.91 mg/dL). Liver Function Tests: Recent Labs  Lab 11/06/17 1627 11/07/17 0926  AST 18 22  ALT 11* 12*  ALKPHOS 77 76  BILITOT 0.9 1.0  PROT 6.2* 5.6*  ALBUMIN 3.1* 2.8*   Recent Labs  Lab 11/06/17 1627  LIPASE 43   No results for input(s): AMMONIA in the last 168 hours. Coagulation Profile: No results for input(s): INR, PROTIME in the last 168 hours. Cardiac Enzymes: No results for input(s): CKTOTAL, CKMB, CKMBINDEX, TROPONINI in the last 168 hours. BNP (last 3 results) No results for input(s): PROBNP in the  last 8760 hours. HbA1C: No results for input(s): HGBA1C in the last 72 hours. CBG: No results for input(s): GLUCAP in the last 168 hours. Lipid Profile: No results for input(s): CHOL, HDL, LDLCALC, TRIG, CHOLHDL, LDLDIRECT in the last 72 hours. Thyroid Function Tests: No results for input(s): TSH, T4TOTAL,  FREET4, T3FREE, THYROIDAB in the last 72 hours. Anemia Panel: No results for input(s): VITAMINB12, FOLATE, FERRITIN, TIBC, IRON, RETICCTPCT in the last 72 hours. Urine analysis:    Component Value Date/Time   COLORURINE YELLOW 06/23/2017 0928   APPEARANCEUR CLEAR 06/23/2017 0928   LABSPEC 1.021 06/23/2017 0928   PHURINE 5.0 06/23/2017 0928   GLUCOSEU NEGATIVE 06/23/2017 0928   HGBUR NEGATIVE 06/23/2017 0928   HGBUR negative 01/13/2010 1056   BILIRUBINUR NEGATIVE 06/23/2017 0928   BILIRUBINUR negative 02/11/2017 1709   KETONESUR NEGATIVE 06/23/2017 0928   PROTEINUR NEGATIVE 06/23/2017 0928   UROBILINOGEN 0.2 02/11/2017 1709   UROBILINOGEN 0.2 01/13/2010 1056   NITRITE NEGATIVE 06/23/2017 0928   LEUKOCYTESUR NEGATIVE 06/23/2017 0928     Ralynn San M.D. [facility] 11/10/2017, 12:09 PM  Pager: [phone] Between 7am to 7pm - call Pager - [phone]  After 7pm go to [email or web address] - password [email or web address]  Call night coverage person covering after 7pm

## 2017-11-10 NOTE — Progress Notes (Signed)
Patient ID: Chelsea Keller, female   DOB: 1934-07-10, 83 y.o.   MRN: [id number]   Acute Care Surgery Service Progress Note:    Chief Complaint/Subjective: +flatus. Less abd pain. But has less of an appetite. Thinks she would prefer going back to thick liquids Charge nurse rounded with me Still having a fair amount of MSKL pain - Dr Tana Coast is going to get hip xray  Objective: Vital signs in last 24 hours: Temp:  [98.1 F (36.7 C)-101 F (38.3 C)] 99.3 F (37.4 C) (05/08 0559) Pulse Rate:  [81-88] 88 (05/08 0559) Resp:  [16-18] 18 (05/08 0559) BP: (135-139)/(48-58) 136/58 (05/08 0559) SpO2:  [94 %-96 %] 94 % (05/08 0559) Last BM Date: 11/09/17  Intake/Output from previous day: 05/07 0701 - 05/08 0700 In: 681.1 [P.O.:360; I.V.:321.1] Out: 500 [Urine:500] Intake/Output this shift: No intake/output data recorded.  Lungs: cta, nonlabored  Cardiovascular: reg  Abd: severe obesity; soft, less TTP than other day; no rebound/guarding/peritonitis  Extremities: no edema, +SCDs  Neuro: alert, nonfocal  Lab Results: CBC  Recent Labs    11/09/17 0344 11/10/17 0337  WBC 11.5* 13.1*  HGB 8.5* 8.6*  HCT 29.1* 30.1*  PLT 320 313   BMET Recent Labs    11/09/17 0344 11/10/17 0337  NA 137 137  K 3.3* 4.0  CL 101 103  CO2 27 27  GLUCOSE 96 125*  BUN 14 14  CREATININE 0.94 0.91  CALCIUM 8.6* 8.6*   LFT Hepatic Function Latest Ref Rng & Units 11/07/2017 11/06/2017 09/13/2017  Total Protein 6.5 - 8.1 g/dL 5.6(L) 6.2(L) -  Albumin 3.5 - 5.0 g/dL 2.8(L) 3.1(L) 3.5  AST 15 - 41 U/L 22 18 -  ALT 14 - 54 U/L 12(L) 11(L) -  Alk Phosphatase 38 - 126 U/L 76 77 -  Total Bilirubin 0.3 - 1.2 mg/dL 1.0 0.9 -  Bilirubin, Direct 0.1 - 0.5 mg/dL - 0.2 -   PT/INR No results for input(s): LABPROT, INR in the last 72 hours. ABG No results for input(s): PHART, HCO3 in the last 72 hours.  Invalid input(s): PCO2, PO2  Studies/Results:  Anti-infectives: Anti-infectives (From admission,  onward)   Start     Dose/Rate Route Frequency Ordered Stop   11/08/17 1300  metroNIDAZOLE (FLAGYL) IVPB 500 mg     500 mg 100 mL/hr over 60 Minutes Intravenous Every 8 hours 11/08/17 0836     11/07/17 1100  metroNIDAZOLE (FLAGYL) IVPB 500 mg  Status:  Discontinued     500 mg 100 mL/hr over 60 Minutes Intravenous Every 6 hours 11/07/17 0918 11/08/17 0836   11/07/17 1000  cefTRIAXone (ROCEPHIN) 2 g in sodium chloride 0.9 % 100 mL IVPB    Note to Pharmacy:  Pharmacy may adjust dosing strength / duration / interval for maximal efficacy   2 g 200 mL/hr over 30 Minutes Intravenous Every 24 hours 11/07/17 0918        Medications: Scheduled Meds: . feeding supplement  1 Container Oral BID BM  . gabapentin  300 mg Oral BID  . lip balm  1 application Topical BID  . psyllium  1 packet Oral Daily  . saccharomyces boulardii  250 mg Oral BID   Continuous Infusions: . cefTRIAXone (ROCEPHIN)  IV Stopped (11/09/17 1256)  . famotidine (PEPCID) IV Stopped (11/09/17 2203)  . heparin 1,450 Units/hr (11/09/17 1415)  . methocarbamol (ROBAXIN)  IV    . metronidazole Stopped (11/10/17 0606)   PRN Meds:.acetaminophen, alum & mag hydroxide-simeth, bisacodyl, guaiFENesin-dextromethorphan, hydrocortisone, hydrocortisone  cream, magic mouthwash, menthol-cetylpyridinium, methocarbamol **OR** methocarbamol (ROBAXIN)  IV, morphine injection, ondansetron **OR** ondansetron (ZOFRAN) IV, phenol, prochlorperazine  Assessment/Plan: Patient Active Problem List   Diagnosis Date Noted  . History of pulmonary embolism 11/07/2017  . Chronic anticoagulation 11/07/2017  . Diverticulitis of small intestine with perforation 11/07/2017  . Small bowel obstruction ([place]) 11/06/2017  . Neurodermatitis 07/28/2017  . Urinary incontinence 05/26/2017  . Intractable pain-left hip 04/03/2017  . Fibromyalgia 04/03/2017  . Anemia 04/03/2017  . Chronic diastolic CHF (congestive heart failure) ([place]) 04/03/2017  . Hypertension  04/03/2017  . Hypertriglyceridemia 04/03/2017  . Vitamin B12 deficiency 04/03/2017  . CKD (chronic kidney disease) stage 3, GFR 30-59 ml/min (HCC) 04/03/2017  . Acute hypokalemia 04/03/2017  . Left sided sciatica   . Advance directive discussed with patient 01/19/2017  . Chronic diastolic congestive heart failure ([place]) 05/09/2014  . Mood disorder ([place]) 11/03/2013  . Knee joint pain 10/31/2013  . Psoriasis 02/21/2013  . Routine general medical examination at a health care facility 06/14/2012  . BMI 50.0-59.9, adult ([place]) 12/02/2011  . VENTRICULAR HYPERTROPHY, LEFT 10/19/2008  . Sleep disturbance 10/25/2007  . Venous (peripheral) insufficiency 08/11/2007  . Osteoarthritis, multiple sites 08/11/2007  . Impaired fasting glucose 08/11/2007  . COLONIC POLYPS, HX OF 05/20/2007  . B12 DEFICIENCY 09/30/2006  . Essential hypertension 09/30/2006  . Allergic rhinitis due to pollen 09/30/2006   Abdominal pain Acute on chronic anemia - iron def Chronic diastolic heart failure HTN Recent Pulmonary Emboli Dec 2018 on eliquis Severe obesity Mild protein calorie malnutrition HTN Fibromyalgia  SB diverticulitis vs ileus vs psbo.  Her admitting CT was not clear cut.  Isolated fever. abd Exam improved but WBC up today.  Given that I want to repeat her CT to see if abd picture is more defined today - she is nontoxic appearing.   Cont abx PT/ot  D/w dr Tana Coast Disposition:  LOS: 4 days    Leighton Ruff. Redmond Pulling, MD, FACS General, Bariatric, & Minimally Invasive Surgery [phone] [facility]

## 2017-11-10 NOTE — Telephone Encounter (Signed)
Let Chelsea Keller know that unfortunately, extended bed rest due to illness will invariably increase weakness (as she knows). A decision will be made when she is ready to leave the hospital about whether she can go home, or needs rehab. If going home, home therapy can be ordered from the hospital. She should speak to the social worker at the hospital about her concerns

## 2017-11-10 NOTE — Progress Notes (Addendum)
Patient request something different for pain, attending notified, order placed, patient reports allergy to hydrocodone and unable to sit up due to pain to drink contrast, surgery made aware, surgery placed orders, okay to do CT without contrast

## 2017-11-10 NOTE — Progress Notes (Signed)
PT Cancellation Note  Patient Details Name: Chelsea Keller MRN: [id number] DOB: 1934-08-09   Cancelled Treatment:    Reason Eval/Treat Not Completed: Patient declined, no reason specified Patient refused attempts to mobilize and stated "I'm not getting up today because I can't! Don't you people understand English." patient has been educated on benefits of OOB mobility for her recovery. PT will continue to follow acutely.    [place] April, PTA Pager: [phone]   11/10/2017, 9:11 AM

## 2017-11-11 ENCOUNTER — Inpatient Hospital Stay (HOSPITAL_COMMUNITY): Payer: Medicare Other

## 2017-11-11 DIAGNOSIS — M1990 Unspecified osteoarthritis, unspecified site: Secondary | ICD-10-CM

## 2017-11-11 LAB — CBC
HEMATOCRIT: 32 % — AB (ref 36.0–46.0)
HEMOGLOBIN: 9 g/dL — AB (ref 12.0–15.0)
MCH: 22.5 pg — ABNORMAL LOW (ref 26.0–34.0)
MCHC: 28.1 g/dL — ABNORMAL LOW (ref 30.0–36.0)
MCV: 80 fL (ref 78.0–100.0)
Platelets: 355 10*3/uL (ref 150–400)
RBC: 4 MIL/uL (ref 3.87–5.11)
RDW: 19.9 % — AB (ref 11.5–15.5)
WBC: 16.6 10*3/uL — AB (ref 4.0–10.5)

## 2017-11-11 LAB — HEPARIN LEVEL (UNFRACTIONATED): Heparin Unfractionated: 0.89 IU/mL — ABNORMAL HIGH (ref 0.30–0.70)

## 2017-11-11 MED ORDER — METHYLPREDNISOLONE ACETATE 40 MG/ML INJ SUSP (RADIOLOG
240.0000 mg | Freq: Once | INTRAMUSCULAR | Status: AC
  Administered 2017-11-11: 240 mg via INTRA_ARTICULAR

## 2017-11-11 MED ORDER — METHYLPREDNISOLONE ACETATE 40 MG/ML IJ SUSP
INTRAMUSCULAR | Status: AC
  Administered 2017-11-11: 240 mg via INTRA_ARTICULAR
  Filled 2017-11-11: qty 2

## 2017-11-11 MED ORDER — IOPAMIDOL (ISOVUE-M 200) INJECTION 41%
INTRAMUSCULAR | Status: AC
  Administered 2017-11-11: 12 mL via INTRA_ARTICULAR
  Filled 2017-11-11: qty 10

## 2017-11-11 MED ORDER — POLYETHYLENE GLYCOL 3350 17 G PO PACK
17.0000 g | PACK | Freq: Every day | ORAL | Status: DC | PRN
  Administered 2017-11-11: 17 g via ORAL
  Filled 2017-11-11 (×2): qty 1

## 2017-11-11 MED ORDER — LIDOCAINE HCL 1 % IJ SOLN
INTRAMUSCULAR | Status: AC
  Administered 2017-11-11: 10 mL via INTRADERMAL
  Filled 2017-11-11: qty 10

## 2017-11-11 MED ORDER — SENNOSIDES-DOCUSATE SODIUM 8.6-50 MG PO TABS
1.0000 | ORAL_TABLET | Freq: Two times a day (BID) | ORAL | Status: DC
  Administered 2017-11-11 (×2): 1 via ORAL
  Filled 2017-11-11 (×3): qty 1

## 2017-11-11 MED ORDER — APIXABAN 5 MG PO TABS: 5.0000 mg | ORAL_TABLET | Freq: Two times a day (BID) | ORAL | Status: DC

## 2017-11-11 MED ORDER — APIXABAN 5 MG PO TABS
5.0000 mg | ORAL_TABLET | Freq: Two times a day (BID) | ORAL | Status: DC
  Administered 2017-11-11 – 2017-11-12 (×2): 5 mg via ORAL
  Filled 2017-11-11 (×2): qty 1

## 2017-11-11 MED ORDER — LIDOCAINE HCL 1 % IJ SOLN
10.0000 mL | Freq: Once | INTRAMUSCULAR | Status: AC
  Administered 2017-11-11 (×2): 10 mL via INTRADERMAL

## 2017-11-11 MED ORDER — IOPAMIDOL (ISOVUE-M 200) INJECTION 41%
20.0000 mL | Freq: Once | INTRAMUSCULAR | Status: AC
  Administered 2017-11-11: 12 mL via INTRA_ARTICULAR

## 2017-11-11 MED ORDER — METHYLPREDNISOLONE ACETATE 80 MG/ML IJ SUSP
INTRAMUSCULAR | Status: AC
  Filled 2017-11-11: qty 2

## 2017-11-11 MED ORDER — BUPIVACAINE HCL (PF) 0.5 % IJ SOLN
10.0000 mL | Freq: Once | INTRAMUSCULAR | Status: AC
  Administered 2017-11-11: 10 mL

## 2017-11-11 MED ORDER — BUPIVACAINE HCL (PF) 0.5 % IJ SOLN
INTRAMUSCULAR | Status: AC
  Administered 2017-11-11: 10 mL
  Filled 2017-11-11: qty 30

## 2017-11-11 NOTE — Progress Notes (Signed)
Bilateral hip joint injections for chronic hip pain.  Overlying skin prepped with Betadine, draped in the usual sterile fashion, and infiltrated locally with buffered Lidocaine. Curved [22] gauge spinal needle advanced to the superolateral margin of the [right and then left] femoral head. 1 ml of Lidocaine injected easily. Diagnostic injection of iodinated contrastdemonstrates intra-articular spread without intravascular component.   [120]mg Depo-Medrol and 5 mlSensorcaine 0.5% were then administered.   No immediate complication.

## 2017-11-11 NOTE — Progress Notes (Signed)
[place] for Heparin  Indication: h/o extensive bilateral PE (06/2017) pta Apixaban (holding)  Allergies  Allergen Reactions  . Codeine Sulfate Shortness Of Breath  . Celecoxib Other (See Comments)    Caused vaginal bleeding  . Erythromycin Base Nausea And Vomiting    Patient Measurements: Height: 5\' 2"  (157.5 cm) Weight: 261 lb 11 oz (118.7 kg) IBW/kg (Calculated) : 50.1 Heparin Dosing Weight: 79.5 kg  Vital Signs: Temp: 98.9 F (37.2 C) (05/08 2107) Temp Source: Oral (05/08 2107) BP: 115/48 (05/08 2107) Pulse Rate: 81 (05/08 2107)  Labs: Recent Labs    11/08/17 4496 11/09/17 0344 11/10/17 0337 11/10/17 1852 11/11/17 0307  HGB 8.4* 8.5* 8.6*  --  9.0*  HCT 29.0* 29.1* 30.1*  --  32.0*  PLT 315 320 313  --  355  APTT 78* 106* 64* 40*  --   HEPARINUNFRC 1.26* 1.08* 0.68 0.37 0.89*  CREATININE 0.99 0.94 0.91  --   --     Estimated Creatinine Clearance: 59.3 mL/min (by C-G formula based on SCr of 0.91 mg/dL).  Assessment: 83 y.o female on Eliquis 5 mg BID PTA for hx PE (06/2017, extensive bilateral PE without right heart strain per CTA). Holding Eliquis and transitioned to heparin as may need procedure. Also with FOB positive on admit - Surgery recommended heparin for now.   Heparin level is therapeutic at 0.37 and aPTT is below goal at 40 sec. Appears heparin level is now correlating and tends to be more accurate. No bleeding noted.  5/9 AM Update: heparin level trending up (0.37>>0.89), Hgb stable  Goal of Therapy:  Heparin level 0.3-0.7 units/ml Monitor platelets by anticoagulation protocol: Yes   Plan:  Dec heparin to 1400 units/hr [place], PharmD, BCPS Clinical Pharmacist Phone: [phone]

## 2017-11-11 NOTE — Progress Notes (Signed)
[facility]                                                                              Patient Demographics  Chelsea Keller, is a 83 y.o. female, DOB - [birth year], MWN:[id number]  Admit date - 11/06/2017   Admitting Physician Elwyn Reach, MD  Outpatient Primary MD for the patient is Venia Carbon, MD  Outpatient specialists:   LOS - 5  days   Medical records reviewed and are as summarized below:    Chief Complaint  Patient presents with  . Chest Pain  . Abdominal Pain  . Back Pain       Brief summary   83 year old female, lives with her daughter and 3 adult grand sons, independent, PMH of acute PE (extensive bilateral PE without right heart strain by CTA chest 06/23/2017) on Eliquis since, chronic diastolic CHF, chronic lower extremity venous insufficiency/edema, anxiety, depression, HTN, PUD, B12 deficiency, tented to [facility] ED on 5/4 with nausea and abdominal pain.  CT abdomen 5/4 suggestive of PSBO.  General surgery consulted and suspect jejunal diverticulitis with microperforation and PSBO.   Assessment & Plan    Principal Problem:   Diverticulitis of small intestine with micro perforation versus PSBO versus ileus -Appreciate general surgery following, patient was treated supportively with bowel rest, diet advanced on 5/7. -Placed on IV Rocephin and Flagyl, abdominal x-ray 5/6 showed no perforation or high-grade obstruction but mildly distended small bowel loops. -CT abdomen repeated, possibly has a ileus secondary to enteritis, tolerating soft diet -Leukocytosis worsening however no fevers, no worsening of abdominal symptoms, ordered UA, culture and blood cultures  Active Problems: Chronic low back pain, hip pain -Patient continues to refuse physical therapy, skilled nursing facilities for rehab due to pain in her back and hips -Lumbar spine x-ray showed moderate multilevel degenerative disc disease, hip x-rays normal. -Continue pain control,  encourage PT, plan for bilateral hip joint injections for chronic hip pain, hopefully will mobilize better    Essential hypertension -Currently controlled, holding HCTZ    BMI 50.0-59.9, adult ([place]) - Counseled on diet and weight control   chronic diastolic congestive heart failure (HCC) -Currently stable, compensated    Anemia -Possibly iron deficiency anemia secondary to slow chronic GI blood loss, presented with hemoglobin of 7.5, no overt bleeding.  FOBT positive. -EGD 10/2014 unremarkable except for mild gastritis and colonoscopy with multiple polyps, diverticulosis and internal hemorrhoids. -Due to current diverticulitis with microperforation versus PSBO, no acute GI interventions, patient will follow-up with GI outpatient -H&H currently stable    CKD (chronic kidney disease) stage 3, GFR 30-59 ml/min (HCC) -Creatinine currently stable 0.9    History of pulmonary embolism on  Chronic anticoagulation -Diagnosed in December 2018, felt unprovoked however patient has sedentary lifestyle.  No DVT or right heart strain on echo, has been on Eliquis since. -Currently on IV heparin, on hold for the hip injections.  Per surgery recommendations, patient will likely not require any kind of surgery hence will change to Eliquis today.  Chronic lower extremity venous insufficiency/edema Continue  gabapentin to 300 mg 3 times daily  Code Status: CODE STATUS DVT  Prophylaxis:   heparin  Family Communication: Discussed in detail with the patient, all imaging results, lab results explained to the patient    Disposition Plan: Once medically stable  Time Spent in minutes 25 minutes  Procedures:  CT abdomen  Consultants:   Orthopedics, general surgery  Antimicrobials:   IV ceftriaxone, Flagyl   Medications  Scheduled Meds: . bupivacaine  10 mL Infiltration Once  . bupivacaine      . feeding supplement  1 Container Oral BID BM  . gabapentin  300 mg Oral TID  . iopamidol  20 mL  Intra-articular Once  . iopamidol      . lidocaine  10 mL Intradermal Once  . lidocaine      . lip balm  1 application Topical BID  . methylPREDNISolone acetate      . methylPREDNISolone acetate      . methylPREDNISolone acetate  240 mg Intra-articular Once  . psyllium  1 packet Oral Daily  . saccharomyces boulardii  250 mg Oral BID  . senna-docusate  1 tablet Oral BID   Continuous Infusions: . cefTRIAXone (ROCEPHIN)  IV 2 g (11/11/17 1303)  . famotidine (PEPCID) IV Stopped (11/11/17 1497)  . methocarbamol (ROBAXIN)  IV    . metronidazole 500 mg (11/11/17 0548)   PRN Meds:.acetaminophen, alum & mag hydroxide-simeth, bisacodyl, guaiFENesin-dextromethorphan, hydrocortisone, hydrocortisone cream, HYDROmorphone (DILAUDID) injection, magic mouthwash, menthol-cetylpyridinium, methocarbamol **OR** methocarbamol (ROBAXIN)  IV, morphine injection, ondansetron **OR** ondansetron (ZOFRAN) IV, oxyCODONE, phenol, polyethylene glycol, prochlorperazine   Antibiotics   Anti-infectives (From admission, onward)   Start     Dose/Rate Route Frequency Ordered Stop   11/08/17 1300  metroNIDAZOLE (FLAGYL) IVPB 500 mg     500 mg 100 mL/hr over 60 Minutes Intravenous Every 8 hours 11/08/17 0836     11/07/17 1100  metroNIDAZOLE (FLAGYL) IVPB 500 mg  Status:  Discontinued     500 mg 100 mL/hr over 60 Minutes Intravenous Every 6 hours 11/07/17 0918 11/08/17 0836   11/07/17 1000  cefTRIAXone (ROCEPHIN) 2 g in sodium chloride 0.9 % 100 mL IVPB    Note to Pharmacy:  Pharmacy may adjust dosing strength / duration / interval for maximal efficacy   2 g 200 mL/hr over 30 Minutes Intravenous Every 24 hours 11/07/17 0263          Subjective:   Chelsea Keller was seen and examined today.  No fevers, continues to complain of bilateral hip pain.  Feels generalized weakness.  No nausea, vomiting.  No chest pain or shortness of breath.  Currently no abdominal pain.    Objective:   Vitals:   11/10/17 0559  11/10/17 1334 11/10/17 2107 11/11/17 0600  BP: (!) 136/58 (!) 152/73 (!) 115/48 (!) 123/57  Pulse: 88 90 81 76  Resp: 18 16 19 17   Temp: 99.3 F (37.4 C) 97.9 F (36.6 C) 98.9 F (37.2 C) 99.8 F (37.7 C)  TempSrc: Oral  Oral Oral  SpO2: 94% 97% 98% 91%  Weight:      Height:        Intake/Output Summary (Last 24 hours) at 11/11/2017 1436 Last data filed at 11/11/2017 0548 Gross per 24 hour  Intake 760 ml  Output -  Net 760 ml     Wt Readings from Last 3 Encounters:  11/06/17 118.7 kg (261 lb 11 oz)  10/25/17 119.3 kg (263 lb)  09/15/17 119.4 kg (263 lb 3.2 oz)     Exam   General: Alert and oriented x 3,  NAD  Eyes:   HEENT:    Cardiovascular: S1 S2 auscultated, Regular rate and rhythm. No pedal edema b/l  Respiratory: Clear to auscultation bilaterally, no wheezing, rales or rhonchi  Gastrointestinal: Soft, nontender, nondistended, + bowel sounds  Ext: no pedal edema bilaterally  Neuro: no new deficits  Musculoskeletal: No digital cyanosis, clubbing  Skin: No rashes  Psych: Normal affect and demeanor, alert and oriented x3    Data Reviewed:  I have personally reviewed following labs and imaging studies  Micro Results No results found for this or any previous visit (from the past 240 hour(s)).  Radiology Reports Dg Chest 2 View  Result Date: 11/06/2017 CLINICAL DATA:  Central chest pain. Abdominal pain since 4 a.m. Tingling in the hands. EXAM: CHEST - 2 VIEW COMPARISON:  None. FINDINGS: Heart is enlarged. There is no edema or effusion to suggest failure. Aortic atherosclerotic changes are present. Degenerative changes are again noted in the thoracic spine. Vertebral body heights are maintained. Surgical clips are present at the gallbladder fossa. IMPRESSION: 1. Stable cardiomegaly without failure. 2. Aortic atherosclerosis. 3. No acute cardiopulmonary disease. Electronically Signed   By: San Morelle M.D.   On: 11/06/2017 15:37   Dg Lumbar Spine  Complete  Result Date: 11/10/2017 CLINICAL DATA:  Low back pain without known injury. EXAM: LUMBAR SPINE - COMPLETE 4+ VIEW COMPARISON:  CT scan of Nov 06, 2017. FINDINGS: No fracture or significant spondylolisthesis is noted. Osteopenia is noted. Moderate degenerative disc disease is noted at L4-5 and L5-S1. Anterior osteophyte formation is noted at L2-3 and L3-4. Atherosclerosis of abdominal aorta is noted. Degenerative changes seen involving posterior facet joints of L4-5 and L5-S1 bilaterally. IMPRESSION: Moderate multilevel degenerative disc disease. No acute abnormality seen in the lumbar spine. Electronically Signed   By: Marijo Conception, M.D.   On: 11/10/2017 10:22   Dg Ankle 2 Views Right  Result Date: 10/26/2017 CLINICAL DATA:  Pain at the lateral malleolus EXAM: RIGHT ANKLE - 2 VIEW COMPARISON:  None. FINDINGS: Diffuse soft tissue edema and scattered calcifications. No fracture or malalignment. Small plantar calcaneal spur. Minimal medial and lateral degenerative changes. No soft tissue gas. IMPRESSION: Soft tissue edema without acute osseous abnormality. Electronically Signed   By: Donavan Foil M.D.   On: 10/26/2017 14:29   Ct Abdomen Pelvis W Contrast  Result Date: 11/10/2017 CLINICAL DATA:  Improving abdominal pain but increasing white blood cell count with low-grade fever. Follow-up CT from 11/06/2017. EXAM: CT ABDOMEN AND PELVIS WITH CONTRAST TECHNIQUE: Multidetector CT imaging of the abdomen and pelvis was performed using the standard protocol following bolus administration of intravenous contrast. CONTRAST:  181mL OMNIPAQUE IOHEXOL 300 MG/ML  SOLN COMPARISON:  11/06/2017 FINDINGS: Lower chest: Calcified plaque over the lateral circumflex and right coronary arteries. Mild posterior bibasilar dependent atelectasis and tiny amount right pleural fluid. Hepatobiliary: Previous cholecystectomy. Liver and biliary tree are normal. Mild stable post cholecystectomy prominence of the common bile duct.  Pancreas: Normal. Spleen: Normal. Adrenals/Urinary Tract: Right adrenal gland is normal. Stable 2.2 cm left adrenal mass likely an adenoma. Kidneys are normal in size without hydronephrosis or nephrolithiasis. 1 cm left renal cortical hypodensity over the lower pole too small to characterize but likely a cyst. Ureters and bladder are unremarkable. Stomach/Bowel: Stomach is normal. Resolution of the previously seen central mildly dilated small bowel loops with air with a single minimally prominent small bowel loop in the lower central abdomen measuring 2.9 cm in diameter. Normal appendix. Diverticulosis of the colon.  Vascular/Lymphatic: Moderate calcified plaque over the abdominal aorta and iliac arteries. No adenopathy. Reproductive: Previous hysterectomy. Other: No free fluid or focal inflammatory change. Musculoskeletal: Mild degenerate change of the spine and hips. IMPRESSION: Interval improvement in the previously seen prominent small bowel loops in the central mid to lower abdomen with single residual minimally prominent small bowel loops measuring 2.9 cm in greatest diameter. No evidence of obstruction. Stable 2.2 cm left adrenal mass likely an adenoma, although indeterminate. Recommend follow-up adrenal protocol CT on elective basis. Mild diverticulosis of the colon. Aortic Atherosclerosis (ICD10-I70.0). Electronically Signed   By: Marin Olp M.D.   On: 11/10/2017 19:55   Ct Abdomen Pelvis W Contrast  Result Date: 11/06/2017 CLINICAL DATA:  General abdominal pain radiates into chest. EXAM: CT ABDOMEN AND PELVIS WITH CONTRAST TECHNIQUE: Multidetector CT imaging of the abdomen and pelvis was performed using the standard protocol following bolus administration of intravenous contrast. CONTRAST:  1109mL OMNIPAQUE IOHEXOL 300 MG/ML  SOLN COMPARISON:  None. FINDINGS: Lower chest: Lung bases are clear. Hepatobiliary: No focal hepatic lesion. Postcholecystectomy. No biliary dilatation. Pancreas: Pancreas is  normal. No ductal dilatation. No pancreatic inflammation. Spleen: Normal spleen Adrenals/urinary tract: Adrenal glands and kidneys are normal. The ureters and bladder normal. Stomach/Bowel: The stomach duodenum normal. There is a caliber change from the jejunum to the ileum with decompressed ileum; however the jejunum is only minimally distended to 3 cm which is upper limits of normal. No obstructing lesion identified. No mass lesion identified. No pneumatosis. There is a focus of potential extraluminal gas adjacent to the small bowel (image 84/7). This could represent inflamed small bowel diverticulum. Seen on image 83/7 and image 49/4. Terminal ileum is normal. Appendix not identified. The colon is collapsed. Several diverticula sigmoid colon. Vascular/Lymphatic: Abdominal aorta is normal caliber with atherosclerotic calcification. There is no retroperitoneal or periportal lymphadenopathy. No pelvic lymphadenopathy. The SMA and celiac trunk Sir open. Reproductive: Post hysterectomy Other: No free fluid. Musculoskeletal: No aggressive osseous lesion. IMPRESSION: 1. Small bowel caliber transition from jejunum to the ileum with minimal distention of the proximal small bowel suggest partial or intermittent obstruction. There is a focus of inflammation and potential small bowel inflamed diverticulum centrally in the mesentery of the mid small bowel. No high-grade obstructing lesion identified. No pneumatosis or portal venous gas. The colon is collapsed. 2. Atherosclerotic calcification the aorta and branches. The SMA, celiac trunk and IMA are patent. 3. Postcholecystectomy. Electronically Signed   By: Suzy Bouchard M.D.   On: 11/06/2017 17:19   Dg Abd Acute W/chest  Result Date: 11/08/2017 CLINICAL DATA:  Abdominal and chest discomfort but no pain. Abdominal ileus. Small bowel inflammatory changes on earlier CT. EXAM: DG ABDOMEN ACUTE W/ 1V CHEST COMPARISON:  Chest x-ray dated Nov 06, 2017 FINDINGS: The lungs are  well-expanded. The interstitial markings are increased. The cardiac silhouette is enlarged and the pulmonary vascularity is mildly engorged. There is no pleural effusion. There is calcification in the wall of the aortic arch. There is severe degenerative change of the right shoulder. Within the abdomen there is a moderate amount of gas within normal caliber small bowel loops with several loops of distended small bowel in the mid abdomen. The stool and gas pattern in the colon and rectum is normal. The observed bony structures are unremarkable. IMPRESSION: No evidence of perforation or high-grade obstruction. There is mild distention of several small bowel loops in the mid abdomen. Chest x-ray findings worrisome for mild CHF. Thoracic aortic atherosclerosis. Electronically Signed  By: David  Martinique M.D.   On: 11/08/2017 10:24   Dg Fluoro Guided Needle Plc Aspiration/injection Loc  Result Date: 11/11/2017 CLINICAL DATA:  Left hip pain. EXAM: LEFT HIP INJECTION UNDER FLUOROSCOPY FLUOROSCOPY TIME:  Fluoroscopy Time:  0.07 minutes PROCEDURE: Overlying skin prepped with Betadine, draped in the usual sterile fashion, and infiltrated locally with buffered Lidocaine. Curved 22 gauge spinal needle advanced to the superolateral margin of the left femoral head. 1 ml of Lidocaine injected easily. Diagnostic injection of iodinated contrast demonstrates intra-articular spread without intravascular component. 120mg  Depo-Medrol and 5 mlSensorcaine 0.5% were then administered. No immediate complication. IMPRESSION: Technically successful left hip injection under fluoroscopy. Electronically Signed   By: Fidela Salisbury M.D.   On: 11/11/2017 14:31   Dg Fluoro Guided Needle Plc Aspiration/injection Loc  Result Date: 11/11/2017 CLINICAL DATA:  Bilateral hip joint EXAM: RIGHT HIP INJECTION UNDER FLUOROSCOPY FLUOROSCOPY TIME:  Fluoroscopy Time:  0.7 minutes PROCEDURE: Overlying skin prepped with Betadine, draped in the usual  sterile fashion, and infiltrated locally with buffered Lidocaine. Curved 22 gauge spinal needle advanced to the superolateral margin of the right femoral head. 1 ml of Lidocaine injected easily. Diagnostic injection of iodinated contrastdemonstrates intra-articular spread without intravascular component. 120mg  Depo-Medrol and 5 mlSensorcaine 0.5% were then administered. No immediate complication. IMPRESSION: Technically successful right hip injection under fluoroscopy. Electronically Signed   By: Fidela Salisbury M.D.   On: 11/11/2017 14:30   Dg Hips Bilat With Pelvis 2v  Result Date: 11/10/2017 CLINICAL DATA:  Bilateral hip pain without known injury. EXAM: DG HIP (WITH OR WITHOUT PELVIS) 2V BILAT COMPARISON:  None. FINDINGS: There is no evidence of hip fracture or dislocation. There is no evidence of arthropathy or other focal bone abnormality. IMPRESSION: Normal bilateral hips. Electronically Signed   By: Marijo Conception, M.D.   On: 11/10/2017 10:20    Lab Data:  CBC: Recent Labs  Lab 11/07/17 0926 11/08/17 9323 11/09/17 0344 11/10/17 0337 11/11/17 0307  WBC 10.2 11.6* 11.5* 13.1* 16.6*  HGB 8.8* 8.4* 8.5* 8.6* 9.0*  HCT 30.7* 29.0* 29.1* 30.1* 32.0*  MCV 79.5 78.4 79.3 79.2 80.0  PLT 315 315 320 313 557   Basic Metabolic Panel: Recent Labs  Lab 11/06/17 1440 11/07/17 0926 11/08/17 0637 11/09/17 0344 11/10/17 0337  NA 138 139 137 137 137  K 3.4* 3.4* 3.7 3.3* 4.0  CL 97* 103 103 101 103  CO2 28 27 26 27 27   GLUCOSE 137* 114* 106* 96 125*  BUN 27* 19 15 14 14   CREATININE 1.02* 0.98 0.99 0.94 0.91  CALCIUM 9.1 8.6* 8.5* 8.6* 8.6*  MG  --   --  1.6* 1.8  --    GFR: Estimated Creatinine Clearance: 59.3 mL/min (by C-G formula based on SCr of 0.91 mg/dL). Liver Function Tests: Recent Labs  Lab 11/06/17 1627 11/07/17 0926  AST 18 22  ALT 11* 12*  ALKPHOS 77 76  BILITOT 0.9 1.0  PROT 6.2* 5.6*  ALBUMIN 3.1* 2.8*   Recent Labs  Lab 11/06/17 1627  LIPASE 43   No  results for input(s): AMMONIA in the last 168 hours. Coagulation Profile: No results for input(s): INR, PROTIME in the last 168 hours. Cardiac Enzymes: No results for input(s): CKTOTAL, CKMB, CKMBINDEX, TROPONINI in the last 168 hours. BNP (last 3 results) No results for input(s): PROBNP in the last 8760 hours. HbA1C: No results for input(s): HGBA1C in the last 72 hours. CBG: No results for input(s): GLUCAP in the last 168  hours. Lipid Profile: No results for input(s): CHOL, HDL, LDLCALC, TRIG, CHOLHDL, LDLDIRECT in the last 72 hours. Thyroid Function Tests: No results for input(s): TSH, T4TOTAL, FREET4, T3FREE, THYROIDAB in the last 72 hours. Anemia Panel: No results for input(s): VITAMINB12, FOLATE, FERRITIN, TIBC, IRON, RETICCTPCT in the last 72 hours. Urine analysis:    Component Value Date/Time   COLORURINE YELLOW 06/23/2017 0928   APPEARANCEUR CLEAR 06/23/2017 0928   LABSPEC 1.021 06/23/2017 0928   PHURINE 5.0 06/23/2017 0928   GLUCOSEU NEGATIVE 06/23/2017 0928   HGBUR NEGATIVE 06/23/2017 0928   HGBUR negative 01/13/2010 1056   BILIRUBINUR NEGATIVE 06/23/2017 0928   BILIRUBINUR negative 02/11/2017 1709   KETONESUR NEGATIVE 06/23/2017 0928   PROTEINUR NEGATIVE 06/23/2017 0928   UROBILINOGEN 0.2 02/11/2017 1709   UROBILINOGEN 0.2 01/13/2010 1056   NITRITE NEGATIVE 06/23/2017 0928   LEUKOCYTESUR NEGATIVE 06/23/2017 0928     Tanayah Squitieri M.D. [facility] 11/11/2017, 2:36 PM  Pager: [phone] Between 7am to 7pm - call Pager - [phone]  After 7pm go to [email or web address] - password [email or web address]  Call night coverage person covering after 7pm

## 2017-11-11 NOTE — Evaluation (Signed)
Occupational Therapy Evaluation Patient Details Name: Chelsea Keller MRN: [id number] DOB: Nov 07, 1934 Today's Date: 11/11/2017    History of Present Illness Pt is an 83 y/o female admitted secondary to diverticulitis of small intestine with perforation. PMH includes dCHF, HTN, CKD, PE, and L TKA.    Clinical Impression   This 83 y/o female presents with the above. PTA pt reports she was mod independent with ADLs and functional mobility using rollator. Pt limited by pain and also appears to be self-limiting this session, refusing bed mobility, EOB, or OOB activity this session. Max education was provided regarding importance of mobility and OOB activity to maintain PLOF. Pt with decreased insight into deficits, reporting she feels she will be able to do these tasks once she returns home despite refusal for OOB at this time. Pt currently requiring setup assist for self-feeding and simple grooming ADLs, total assist for additional UB/LB ADLs. Based on pt's current level recommend SNF level services after discharge; will continue to follow acutely to progress pt's activity with ADLs and mobility.       Follow Up Recommendations  SNF;Supervision/Assistance - 24 hour    Equipment Recommendations  Other (comment)(TBD in next venue )           Precautions / Restrictions Precautions Precautions: Fall Restrictions Weight Bearing Restrictions: No      Mobility Bed Mobility               General bed mobility comments: refuses, unable d/t pain                                                                   ADL either performed or assessed with clinical judgement   ADL Overall ADL's : Needs assistance/impaired Eating/Feeding: Set up   Grooming: Set up;Bed level                                 General ADL Comments: pt adamantly refusing any bed mobility, EOB or OOB activity; Max education provided during session regarding importance of OOB  and realistic expectations as pt believes she will be able to return home and complete ADL tasks despite refusing activity completion while in the hospital, reports she will move when pain improves; pt currently requiring setup assist for self-feeding and simple grooming ADLs and  totalA for all additional ADLs at bed level                          Pertinent Vitals/Pain Pain Assessment: Faces Faces Pain Scale: Hurts worst Pain Location: L hip/LLE/back/R shoulder Pain Descriptors / Indicators: Grimacing;Guarding Pain Intervention(s): Monitored during session     Hand Dominance Right   Extremity/Trunk Assessment Upper Extremity Assessment Upper Extremity Assessment: RUE deficits/detail RUE Deficits / Details: limited AROM due to pain; pt with swelling/edema in R hand RUE: Unable to fully assess due to pain RUE Coordination: decreased gross motor   Lower Extremity Assessment Lower Extremity Assessment: Defer to PT evaluation       Communication Communication Communication: No difficulties   Cognition Arousal/Alertness: Awake/alert Behavior During Therapy: Anxious Overall Cognitive Status: Impaired/Different from baseline Area of Impairment: Attention;Safety/judgement  Current Attention Level: Sustained     Safety/Judgement: Decreased awareness of deficits     General Comments: pt is internally distracted and perseverating on her pain; she cannot converse about anything but the pain;  she exhibits decr insight into her deficits and states she is goign to go home where her grandchildren can lift her up if needed;  she is not agreeable to additional DME, hospital bed or lift equipment   General Comments       Exercises General Exercises - Upper Extremity Digit Composite Flexion: AROM;AAROM;Right;Supine Composite Extension: AROM;AAROM;Right;Supine General Exercises - Lower Extremity Ankle Circles/Pumps: AROM;Both;5 reps;Limitations Ankle  Circles/Pumps Limitations: decr ROM Quad Sets: AROM;Both;5 reps;Limitations Quad Sets Limitations: limited effort Heel Slides: AAROM;Right;5 reps;Limitations Heel Slides Limitations: pain Other Exercises Other Exercises: encouraged UE AROM and digit flexion/extension in RUE to decrease edema         Home Living Family/patient expects to be discharged to:: Private residence Living Arrangements: Children Available Help at Discharge: Family;Available PRN/intermittently Type of Home: House Home Access: Stairs to enter;Level entry     Home Layout: Two level;Bed/bath upstairs Alternate Level Stairs-Number of Steps: has chair lift    Bathroom Shower/Tub: Tub/shower unit;Curtain   Bathroom Toilet: Standard     Home Equipment: Bedside commode;Tub bench;Walker - 4 wheels;Cane - single point;Grab bars - tub/shower;Shower seat;Other (comment)          Prior Functioning/Environment Level of Independence: Independent with assistive device(s)        Comments: Uses rollator for ambulation         OT Problem List: Decreased strength;Decreased range of motion;Decreased activity tolerance;Pain      OT Treatment/Interventions: Self-care/ADL training;DME and/or AE instruction;Therapeutic activities;Balance training;Therapeutic exercise;Patient/family education    OT Goals(Current goals can be found in the care plan section) Acute Rehab OT Goals Patient Stated Goal: to go home  OT Goal Formulation: With patient Time For Goal Achievement: 11/25/17 Potential to Achieve Goals: Good  OT Frequency: Min 2X/week               Co-evaluation PT/OT/SLP Co-Evaluation/Treatment: Yes Reason for Co-Treatment: Necessary to address cognition/behavior during functional activity;For patient/therapist safety PT goals addressed during session: Strengthening/ROM OT goals addressed during session: Strengthening/ROM      AM-PAC PT "6 Clicks" Daily Activity     Outcome Measure Help from another  person eating meals?: A Little Help from another person taking care of personal grooming?: A Little Help from another person toileting, which includes using toliet, bedpan, or urinal?: Total Help from another person bathing (including washing, rinsing, drying)?: Total Help from another person to put on and taking off regular upper body clothing?: Total Help from another person to put on and taking off regular lower body clothing?: Total 6 Click Score: 10   End of Session Nurse Communication: Mobility status  Activity Tolerance: Patient limited by pain;Other (comment)(pt self-limiting ) Patient left: in bed;with call bell/phone within reach  OT Visit Diagnosis: Muscle weakness (generalized) (M62.81);Pain Pain - part of body: (multiple areas)                Time: 8295-6213 OT Time Calculation (min): 15 min Charges:  OT General Charges $OT Visit: 1 Visit G-Codes:     Lou Cal, OT Pager [phone] 11/11/2017   Chelsea Keller 11/11/2017, 12:41 PM

## 2017-11-11 NOTE — Progress Notes (Signed)
Per pt and family she has not been able to walk for a while ambulation order not accomplished

## 2017-11-11 NOTE — Progress Notes (Signed)
CSW received consult regarding PT recommendation of SNF at discharge.  Patient is refusing SNF. She states she has been to two before and wants to return home with her daughter and grandsons at discharge. She would like [facility]. She states that she has all equipment at home. She wants to wait to see how she feels at discharge to decide if she needs transport home. RNCM alerted.    CSW signing off.   Percell Locus Latysha Thackston LCSWA [phone]

## 2017-11-11 NOTE — Progress Notes (Signed)
[facility] Progress Note     Subjective: CC- hip pain Resting comfortable in bed. Patient states that she has no abdominal pain at this time. Every once in a while she will get a mild crampy discomfort in her abdomen, but overall pain is improving. Denies n/v. Tolerating soft diet. Passing a lot of flatus. Feels like she may have a bowel movement today.  Objective: Vital signs in last 24 hours: Temp:  [97.9 F (36.6 C)-99.8 F (37.7 C)] 99.8 F (37.7 C) (05/09 0600) Pulse Rate:  [76-90] 76 (05/09 0600) Resp:  [16-19] 17 (05/09 0600) BP: (115-152)/(48-73) 123/57 (05/09 0600) SpO2:  [91 %-98 %] 91 % (05/09 0600) Last BM Date: 11/05/17  Intake/Output from previous day: 05/08 0701 - 05/09 0700 In: 1140 [P.O.:890; IV Piggyback:250] Out: 675 [Urine:675] Intake/Output this shift: No intake/output data recorded.  PE: Gen:  Alert, NAD, pleasant HEENT: EOM's intact, pupils equal and round Card:  RRR Pulm:  CTAB, no W/R/R, effort normal Abd: obese, soft, NT/ND, +BS, no HSM, no hernia Ext:  Calves soft and nontender Psych: A&Ox3  Skin: no rashes noted, warm and dry  Lab Results:  Recent Labs    11/10/17 0337 11/11/17 0307  WBC 13.1* 16.6*  HGB 8.6* 9.0*  HCT 30.1* 32.0*  PLT 313 355   BMET Recent Labs    11/09/17 0344 11/10/17 0337  NA 137 137  K 3.3* 4.0  CL 101 103  CO2 27 27  GLUCOSE 96 125*  BUN 14 14  CREATININE 0.94 0.91  CALCIUM 8.6* 8.6*   PT/INR No results for input(s): LABPROT, INR in the last 72 hours. CMP     Component Value Date/Time   NA 137 11/10/2017 0337   NA 138 10/14/2017 0825   K 4.0 11/10/2017 0337   CL 103 11/10/2017 0337   CO2 27 11/10/2017 0337   GLUCOSE 125 (H) 11/10/2017 0337   GLUCOSE 186 03/10/2014 1035   BUN 14 11/10/2017 0337   BUN 35 (H) 10/14/2017 0825   CREATININE 0.91 11/10/2017 0337   CALCIUM 8.6 (L) 11/10/2017 0337   PROT 5.6 (L) 11/07/2017 0926   ALBUMIN 2.8 (L) 11/07/2017 0926   AST 22 11/07/2017  0926   ALT 12 (L) 11/07/2017 0926   ALKPHOS 76 11/07/2017 0926   BILITOT 1.0 11/07/2017 0926   GFRNONAA 58 (L) 11/10/2017 0337   GFRAA >60 11/10/2017 0337   Lipase     Component Value Date/Time   LIPASE 43 11/06/2017 1627       Studies/Results: Dg Lumbar Spine Complete  Result Date: 11/10/2017 CLINICAL DATA:  Low back pain without known injury. EXAM: LUMBAR SPINE - COMPLETE 4+ VIEW COMPARISON:  CT scan of Nov 06, 2017. FINDINGS: No fracture or significant spondylolisthesis is noted. Osteopenia is noted. Moderate degenerative disc disease is noted at L4-5 and L5-S1. Anterior osteophyte formation is noted at L2-3 and L3-4. Atherosclerosis of abdominal aorta is noted. Degenerative changes seen involving posterior facet joints of L4-5 and L5-S1 bilaterally. IMPRESSION: Moderate multilevel degenerative disc disease. No acute abnormality seen in the lumbar spine. Electronically Signed   By: Marijo Conception, M.D.   On: 11/10/2017 10:22   Ct Abdomen Pelvis W Contrast  Result Date: 11/10/2017 CLINICAL DATA:  Improving abdominal pain but increasing white blood cell count with low-grade fever. Follow-up CT from 11/06/2017. EXAM: CT ABDOMEN AND PELVIS WITH CONTRAST TECHNIQUE: Multidetector CT imaging of the abdomen and pelvis was performed using the standard protocol following bolus administration of  intravenous contrast. CONTRAST:  158mL OMNIPAQUE IOHEXOL 300 MG/ML  SOLN COMPARISON:  11/06/2017 FINDINGS: Lower chest: Calcified plaque over the lateral circumflex and right coronary arteries. Mild posterior bibasilar dependent atelectasis and tiny amount right pleural fluid. Hepatobiliary: Previous cholecystectomy. Liver and biliary tree are normal. Mild stable post cholecystectomy prominence of the common bile duct. Pancreas: Normal. Spleen: Normal. Adrenals/Urinary Tract: Right adrenal gland is normal. Stable 2.2 cm left adrenal mass likely an adenoma. Kidneys are normal in size without hydronephrosis or  nephrolithiasis. 1 cm left renal cortical hypodensity over the lower pole too small to characterize but likely a cyst. Ureters and bladder are unremarkable. Stomach/Bowel: Stomach is normal. Resolution of the previously seen central mildly dilated small bowel loops with air with a single minimally prominent small bowel loop in the lower central abdomen measuring 2.9 cm in diameter. Normal appendix. Diverticulosis of the colon. Vascular/Lymphatic: Moderate calcified plaque over the abdominal aorta and iliac arteries. No adenopathy. Reproductive: Previous hysterectomy. Other: No free fluid or focal inflammatory change. Musculoskeletal: Mild degenerate change of the spine and hips. IMPRESSION: Interval improvement in the previously seen prominent small bowel loops in the central mid to lower abdomen with single residual minimally prominent small bowel loops measuring 2.9 cm in greatest diameter. No evidence of obstruction. Stable 2.2 cm left adrenal mass likely an adenoma, although indeterminate. Recommend follow-up adrenal protocol CT on elective basis. Mild diverticulosis of the colon. Aortic Atherosclerosis (ICD10-I70.0). Electronically Signed   By: Marin Olp M.D.   On: 11/10/2017 19:55   Dg Hips Bilat With Pelvis 2v  Result Date: 11/10/2017 CLINICAL DATA:  Bilateral hip pain without known injury. EXAM: DG HIP (WITH OR WITHOUT PELVIS) 2V BILAT COMPARISON:  None. FINDINGS: There is no evidence of hip fracture or dislocation. There is no evidence of arthropathy or other focal bone abnormality. IMPRESSION: Normal bilateral hips. Electronically Signed   By: Marijo Conception, M.D.   On: 11/10/2017 10:20    Anti-infectives: Anti-infectives (From admission, onward)   Start     Dose/Rate Route Frequency Ordered Stop   11/08/17 1300  metroNIDAZOLE (FLAGYL) IVPB 500 mg     500 mg 100 mL/hr over 60 Minutes Intravenous Every 8 hours 11/08/17 0836     11/07/17 1100  metroNIDAZOLE (FLAGYL) IVPB 500 mg  Status:   Discontinued     500 mg 100 mL/hr over 60 Minutes Intravenous Every 6 hours 11/07/17 0918 11/08/17 0836   11/07/17 1000  cefTRIAXone (ROCEPHIN) 2 g in sodium chloride 0.9 % 100 mL IVPB    Note to Pharmacy:  Pharmacy may adjust dosing strength / duration / interval for maximal efficacy   2 g 200 mL/hr over 30 Minutes Intravenous Every 24 hours 11/07/17 0918         Assessment/Plan Abdominal pain Acute on chronic anemia - iron def Chronic diastolic heart failure HTN Recent Pulmonary Emboli Dec 2018 on eliquis Severe obesity Mild protein calorie malnutrition HTN Fibromyalgia  SB diverticulitis vs ileus 2/2 enteritis vs psbo - MD reviewed CT scans with radiology yesterday, likely think that she had an ileus secondary to enteritis - abdominal exam benign, tolerating soft diet - WBC up to 16.6 TMAX 99.8, but leukocytosis does not appear to be from an abdominal source  ID - rocephin/flagyl 5/5>> FEN - soft diet, Boost VTE - SCDs, ok for chemical DVT prophyalxis from our standpoint Foley - none  Plan - Patient with no abdominal pain, tolerating soft diet, and passing flatus. Likely had an ileus  and this seems to now be resolving. Continue soft diet and bowel regimen. Getting bilateral hip joint cortisone injections today, hopefully this will help her pain and she can mobilize more.   LOS: 5 days    Wellington Hampshire , [facility] 11/11/2017, 9:59 AM Pager: [phone] Consults: [phone] Mon-Fri 7:00 am-4:30 pm Sat-Sun 7:00 am-11:30 am

## 2017-11-11 NOTE — Progress Notes (Signed)
Physical Therapy Treatment Patient Details Name: Chelsea Keller MRN: [id number] DOB: Jun 23, 1935 Today's Date: 11/11/2017    History of Present Illness Pt is an 83 y/o female admitted secondary to diverticulitis of small intestine with perforation. PMH includes dCHF, HTN, CKD, PE, and L TKA.     PT Comments    Pt again with limited participation, diminished insight into deficits; perseverates on pain level; recommended SNF, pt refuses therefore recommend HHPT, may need hospital bed and w/c although pt is not agreeable at this time;    Follow Up Recommendations  Home health PT;Supervision/Assistance - 24 hour(pt refuses SNF)     Equipment Recommendations  Other (comment)(may need w/c, hospital bed)    Recommendations for Other Services       Precautions / Restrictions Precautions Precautions: Fall Restrictions Weight Bearing Restrictions: No    Mobility  Bed Mobility               General bed mobility comments: refuses, unable d/t pain  Transfers                    Ambulation/Gait                 Stairs             Wheelchair Mobility    Modified Rankin (Stroke Patients Only)       Balance                                            Cognition Arousal/Alertness: Awake/alert Behavior During Therapy: Anxious Overall Cognitive Status: Impaired/Different from baseline Area of Impairment: Attention;Safety/judgement                   Current Attention Level: Sustained     Safety/Judgement: Decreased awareness of deficits     General Comments: pt is internally distracted and perseverating on her pain; she cannot converse about anything but the pain;  she exhibits decr insight into her deficits and states she is goign to go home where her grandchildren can lift her up if needed;  she is not agreeable to additional DME, hospital bed or lift equipment      Exercises General Exercises - Lower Extremity Ankle  Circles/Pumps: AROM;Both;5 reps;Limitations Ankle Circles/Pumps Limitations: decr ROM Quad Sets: AROM;Both;5 reps;Limitations Quad Sets Limitations: limited effort Heel Slides: AAROM;Right;5 reps;Limitations Heel Slides Limitations: pain    General Comments        Pertinent Vitals/Pain Pain Assessment: Faces Faces Pain Scale: Hurts worst Pain Location: L hip/LLE/back/R shoulder Pain Descriptors / Indicators: Grimacing;Guarding Pain Intervention(s): Monitored during session    Home Living                      Prior Function            PT Goals (current goals can now be found in the care plan section) Acute Rehab PT Goals Patient Stated Goal: to go home  PT Goal Formulation: With patient Time For Goal Achievement: 11/23/17 Potential to Achieve Goals: Good Progress towards PT goals: Not progressing toward goals - comment(pain)    Frequency    Min 2X/week(decr frequency d/t ltd participation/multiple refusals)      PT Plan Current plan remains appropriate    Co-evaluation PT/OT/SLP Co-Evaluation/Treatment: Yes Reason for Co-Treatment: Necessary to address cognition/behavior during functional activity;For patient/therapist safety PT goals addressed  during session: Strengthening/ROM        AM-PAC PT "6 Clicks" Daily Activity  Outcome Measure  Difficulty turning over in bed (including adjusting bedclothes, sheets and blankets)?: Unable Difficulty moving from lying on back to sitting on the side of the bed? : Unable Difficulty sitting down on and standing up from a chair with arms (e.g., wheelchair, bedside commode, etc,.)?: Unable Help needed moving to and from a bed to chair (including a wheelchair)?: Total Help needed walking in hospital room?: Total Help needed climbing 3-5 steps with a railing? : Total 6 Click Score: 6    End of Session   Activity Tolerance: Patient limited by pain Patient left: in bed;with call bell/phone within reach   PT  Visit Diagnosis: Muscle weakness (generalized) (M62.81);Pain Pain - Right/Left: Right(righ and left) Pain - part of body: Hip;Ankle and joints of foot;Shoulder(back)     Time: 9622-2979 PT Time Calculation (min) (ACUTE ONLY): 15 min  Charges:                       G CodesKenyon Ana, PT Pager: [phone] 11/11/2017    Kenyon Ana 11/11/2017, 11:07 AM

## 2017-11-11 NOTE — Care Management Important Message (Signed)
Important Message  Patient Details  Name: Chelsea Keller MRN: [id number] Date of Birth: 04-Dec-1934   Medicare Important Message Given:  Yes    Orbie Pyo 11/11/2017, 2:17 PM

## 2017-11-11 NOTE — Care Management Note (Addendum)
Case Management Note  Patient Details  Name: Chelsea Keller MRN: [id number] Date of Birth: August 01, 1934  Subjective/Objective:  Pt is an 83 y/o female admitted secondary to diverticulitis of small intestine with perforation.                  Action/Plan: Case manager spoke with patient concerning discharge plan. Patient says she doesn't want to go to SNF, she been in the past, and prefers to go home with services. Patient says she has used [facility] and wishes to do so at this time. Case manager called referral to Havery Moros RN, [place] Liaison. Patient has DME at home. She lives with her daughter and grandsons, will have good support at discharge.   [name] (Daughter)     [phone]           Expected Discharge Date:    pending              Expected Discharge Plan:  [place]  In-House Referral:  NA  Discharge planning Services  CM Consult  Post Acute Care Choice:  Home Health Choice offered to:  Patient  DME Arranged:  N/A(Patient has Gilford Rile, chair lift, 3in1, Electronics engineer) DME Agency:  NA  HH Arranged:  PT, OT HH Agency:  [facility]  Status of Service:  Completed, signed off  If discussed at [place] of Stay Meetings, dates discussed:    Additional Comments:  Ninfa Meeker, RN 11/11/2017, 12:23 PM

## 2017-11-12 ENCOUNTER — Encounter (HOSPITAL_COMMUNITY): Payer: Self-pay | Admitting: Orthopedic Surgery

## 2017-11-12 DIAGNOSIS — M160 Bilateral primary osteoarthritis of hip: Secondary | ICD-10-CM | POA: Diagnosis not present

## 2017-11-12 DIAGNOSIS — K5700 Diverticulitis of small intestine with perforation and abscess without bleeding: Secondary | ICD-10-CM | POA: Diagnosis not present

## 2017-11-12 DIAGNOSIS — Z7401 Bed confinement status: Secondary | ICD-10-CM | POA: Diagnosis not present

## 2017-11-12 DIAGNOSIS — D509 Iron deficiency anemia, unspecified: Secondary | ICD-10-CM | POA: Diagnosis not present

## 2017-11-12 DIAGNOSIS — Z7901 Long term (current) use of anticoagulants: Secondary | ICD-10-CM | POA: Diagnosis not present

## 2017-11-12 DIAGNOSIS — G8929 Other chronic pain: Secondary | ICD-10-CM | POA: Diagnosis not present

## 2017-11-12 DIAGNOSIS — I5032 Chronic diastolic (congestive) heart failure: Secondary | ICD-10-CM | POA: Diagnosis not present

## 2017-11-12 DIAGNOSIS — E46 Unspecified protein-calorie malnutrition: Secondary | ICD-10-CM | POA: Diagnosis not present

## 2017-11-12 DIAGNOSIS — I872 Venous insufficiency (chronic) (peripheral): Secondary | ICD-10-CM | POA: Diagnosis not present

## 2017-11-12 DIAGNOSIS — M5440 Lumbago with sciatica, unspecified side: Secondary | ICD-10-CM | POA: Diagnosis not present

## 2017-11-12 DIAGNOSIS — R2689 Other abnormalities of gait and mobility: Secondary | ICD-10-CM | POA: Diagnosis not present

## 2017-11-12 DIAGNOSIS — M5136 Other intervertebral disc degeneration, lumbar region: Secondary | ICD-10-CM

## 2017-11-12 DIAGNOSIS — D649 Anemia, unspecified: Secondary | ICD-10-CM | POA: Diagnosis not present

## 2017-11-12 DIAGNOSIS — M25552 Pain in left hip: Secondary | ICD-10-CM | POA: Diagnosis not present

## 2017-11-12 DIAGNOSIS — I10 Essential (primary) hypertension: Secondary | ICD-10-CM | POA: Diagnosis not present

## 2017-11-12 DIAGNOSIS — M7062 Trochanteric bursitis, left hip: Secondary | ICD-10-CM | POA: Diagnosis not present

## 2017-11-12 DIAGNOSIS — K5669 Other partial intestinal obstruction: Secondary | ICD-10-CM | POA: Diagnosis not present

## 2017-11-12 DIAGNOSIS — Z6841 Body Mass Index (BMI) 40.0 and over, adult: Secondary | ICD-10-CM | POA: Diagnosis not present

## 2017-11-12 DIAGNOSIS — K5792 Diverticulitis of intestine, part unspecified, without perforation or abscess without bleeding: Secondary | ICD-10-CM | POA: Diagnosis not present

## 2017-11-12 DIAGNOSIS — M461 Sacroiliitis, not elsewhere classified: Secondary | ICD-10-CM

## 2017-11-12 DIAGNOSIS — E785 Hyperlipidemia, unspecified: Secondary | ICD-10-CM | POA: Diagnosis not present

## 2017-11-12 DIAGNOSIS — Z5189 Encounter for other specified aftercare: Secondary | ICD-10-CM | POA: Diagnosis not present

## 2017-11-12 DIAGNOSIS — M169 Osteoarthritis of hip, unspecified: Secondary | ICD-10-CM

## 2017-11-12 DIAGNOSIS — M2550 Pain in unspecified joint: Secondary | ICD-10-CM | POA: Diagnosis not present

## 2017-11-12 DIAGNOSIS — R1084 Generalized abdominal pain: Secondary | ICD-10-CM | POA: Diagnosis not present

## 2017-11-12 DIAGNOSIS — R488 Other symbolic dysfunctions: Secondary | ICD-10-CM | POA: Diagnosis not present

## 2017-11-12 DIAGNOSIS — M25551 Pain in right hip: Secondary | ICD-10-CM | POA: Diagnosis not present

## 2017-11-12 DIAGNOSIS — R278 Other lack of coordination: Secondary | ICD-10-CM | POA: Diagnosis not present

## 2017-11-12 DIAGNOSIS — M6281 Muscle weakness (generalized): Secondary | ICD-10-CM | POA: Diagnosis not present

## 2017-11-12 DIAGNOSIS — I509 Heart failure, unspecified: Secondary | ICD-10-CM | POA: Diagnosis not present

## 2017-11-12 DIAGNOSIS — R627 Adult failure to thrive: Secondary | ICD-10-CM | POA: Diagnosis not present

## 2017-11-12 DIAGNOSIS — M47818 Spondylosis without myelopathy or radiculopathy, sacral and sacrococcygeal region: Secondary | ICD-10-CM | POA: Diagnosis present

## 2017-11-12 DIAGNOSIS — M797 Fibromyalgia: Secondary | ICD-10-CM | POA: Diagnosis not present

## 2017-11-12 DIAGNOSIS — M792 Neuralgia and neuritis, unspecified: Secondary | ICD-10-CM | POA: Diagnosis not present

## 2017-11-12 DIAGNOSIS — M51369 Other intervertebral disc degeneration, lumbar region without mention of lumbar back pain or lower extremity pain: Secondary | ICD-10-CM | POA: Diagnosis present

## 2017-11-12 HISTORY — DX: Other intervertebral disc degeneration, lumbar region: M51.36

## 2017-11-12 HISTORY — DX: Osteoarthritis of hip, unspecified: M16.9

## 2017-11-12 HISTORY — DX: Other intervertebral disc degeneration, lumbar region without mention of lumbar back pain or lower extremity pain: M51.369

## 2017-11-12 HISTORY — DX: Spondylosis without myelopathy or radiculopathy, sacral and sacrococcygeal region: M47.818

## 2017-11-12 HISTORY — DX: Sacroiliitis, not elsewhere classified: M46.1

## 2017-11-12 LAB — CBC
HEMATOCRIT: 33 % — AB (ref 36.0–46.0)
HEMOGLOBIN: 9.3 g/dL — AB (ref 12.0–15.0)
MCH: 22.6 pg — ABNORMAL LOW (ref 26.0–34.0)
MCHC: 28.2 g/dL — AB (ref 30.0–36.0)
MCV: 80.3 fL (ref 78.0–100.0)
PLATELETS: 392 10*3/uL (ref 150–400)
RBC: 4.11 MIL/uL (ref 3.87–5.11)
RDW: 19.9 % — AB (ref 11.5–15.5)
WBC: 15.5 10*3/uL — ABNORMAL HIGH (ref 4.0–10.5)

## 2017-11-12 LAB — BASIC METABOLIC PANEL
ANION GAP: 10 (ref 5–15)
BUN: 18 mg/dL (ref 6–20)
CO2: 25 mmol/L (ref 22–32)
Calcium: 9.2 mg/dL (ref 8.9–10.3)
Chloride: 103 mmol/L (ref 101–111)
Creatinine, Ser: 0.86 mg/dL (ref 0.44–1.00)
GFR calc Af Amer: >60 mL/min (ref >60)
GFR calc non Af Amer: >60 mL/min (ref >60)
GLUCOSE: 125 mg/dL — AB (ref 65–99)
POTASSIUM: 4.4 mmol/L (ref 3.5–5.1)
Sodium: 138 mmol/L (ref 135–145)

## 2017-11-12 LAB — GLUCOSE, CAPILLARY: GLUCOSE-CAPILLARY: 126 mg/dL — AB (ref 65–99)

## 2017-11-12 MED ORDER — OXYCODONE HCL 5 MG PO TABS: 5.0000 mg | ORAL_TABLET | Freq: Four times a day (QID) | ORAL | 0 refills | Status: AC | PRN

## 2017-11-12 MED ORDER — PSYLLIUM 95 % PO PACK
1.0000 | PACK | Freq: Every day | ORAL | 1 refills | Status: DC
Start: 2017-11-13 — End: 2018-02-03

## 2017-11-12 MED ORDER — POLYETHYLENE GLYCOL 3350 17 G PO PACK: 17.0000 g | PACK | Freq: Every day | ORAL | 0 refills | Status: DC | PRN

## 2017-11-12 MED ORDER — CEFUROXIME AXETIL 500 MG PO TABS: 500.0000 mg | ORAL_TABLET | Freq: Two times a day (BID) | ORAL | 0 refills | Status: AC

## 2017-11-12 MED ORDER — SENNOSIDES-DOCUSATE SODIUM 8.6-50 MG PO TABS: 1.0000 | ORAL_TABLET | Freq: Two times a day (BID) | ORAL | 0 refills | Status: DC

## 2017-11-12 MED ORDER — METHOCARBAMOL 500 MG PO TABS: 1000.0000 mg | ORAL_TABLET | Freq: Four times a day (QID) | ORAL | 0 refills | Status: DC | PRN

## 2017-11-12 MED ORDER — SACCHAROMYCES BOULARDII 250 MG PO CAPS: 250.0000 mg | ORAL_CAPSULE | Freq: Two times a day (BID) | ORAL | 0 refills | Status: DC

## 2017-11-12 MED ORDER — METRONIDAZOLE 500 MG PO TABS: 500.0000 mg | ORAL_TABLET | Freq: Three times a day (TID) | ORAL | 0 refills | Status: AC

## 2017-11-12 MED ORDER — ONDANSETRON 4 MG PO TBDP: 4.0000 mg | ORAL_TABLET | Freq: Three times a day (TID) | ORAL | 0 refills | Status: DC | PRN

## 2017-11-12 MED ORDER — HYDROCORTISONE 2.5 % RE CREA: 1.0000 "application " | TOPICAL_CREAM | Freq: Four times a day (QID) | RECTAL | 0 refills | Status: DC | PRN

## 2017-11-12 NOTE — Clinical Social Work Note (Signed)
Clinical Social Work Assessment  Patient Details  Name: Chelsea Keller MRN: [id number] Date of Birth: [birth year]  Date of referral:  11/12/17               Reason for consult:  Facility Placement                Permission sought to share information with:  Facility Art therapist granted to share information::  Yes, Verbal Permission Granted  Name::        Agency::  SNFs  Relationship::     Contact Information:     Housing/Transportation Living arrangements for the past 2 months:  Single Family Home Source of Information:  Patient Patient Interpreter Needed:  None Criminal Activity/Legal Involvement Pertinent to Current Situation/Hospitalization:  No - Comment as needed Significant Relationships:  Adult Children Lives with:  Adult Children Do you feel safe going back to the place where you live?  No Need for family participation in patient care:  No (Coment)  Care giving concerns:  CSW received consult for possible SNF placement at time of discharge. CSW spoke with patient regarding PT recommendation of SNF placement at time of discharge. Patient has changed her mind and now expressed understanding of PT recommendation and is agreeable to SNF placement at time of discharge. CSW to continue to follow and assist with discharge planning needs.   Social Worker assessment / plan:  CSW spoke with patient concerning possibility of rehab at [facility] before returning home.  Employment status:  Retired Forensic scientist:  Medicare PT Recommendations:  [place] / Referral to community resources:  [place]  Patient/Family's Response to care:  Patient recognizes need for rehab before returning home and is agreeable to a SNF in [place]. Patient reported preference for [facility] since she has been there before.  Patient/Family's Understanding of and Emotional Response to Diagnosis, Current Treatment, and Prognosis:   Patient/family is realistic regarding therapy needs and expressed being hopeful for SNF placement. Patient expressed understanding of CSW role and discharge process as well as medical condition. No questions/concerns about plan or treatment.    Emotional Assessment Appearance:  Appears stated age Attitude/Demeanor/Rapport:  Gracious Affect (typically observed):  Accepting, Appropriate Orientation:  Oriented to Self, Oriented to Place, Oriented to  Time, Oriented to Situation Alcohol / Substance use:  Not Applicable Psych involvement (Current and /or in the community):  No (Comment)  Discharge Needs  Concerns to be addressed:  Care Coordination Readmission within the last 30 days:  No Current discharge risk:  None Barriers to Discharge:  No Barriers Identified   Benard Halsted, [place] 11/12/2017, 10:08 AM

## 2017-11-12 NOTE — Discharge Instructions (Signed)
Low-Fiber Diet °Fiber is found in fruits, vegetables, and whole grains. A low-fiber diet restricts fibrous foods that are not digested in the small intestine. A diet containing about 10-15 grams of fiber per day is considered low fiber. Low-fiber diets may be used to: °· Promote healing and rest the bowel during intestinal flare-ups. °· Prevent blockage of a partially obstructed or narrowed gastrointestinal tract. °· Reduce fecal weight and volume. °· Slow the movement of feces. ° °You may be on a low-fiber diet as a transitional diet following surgery, after an injury (trauma), or because of a short (acute) or lifelong (chronic) illness. Your health care provider will determine the length of time you need to stay on this diet. °What do I need to know about a low-fiber diet? °Always check the fiber content on the packaging's Nutrition Facts label, especially on foods from the grains list. Ask your dietitian if you have questions about specific foods that are related to your condition, especially if the food is not listed below. In general, a low-fiber food will have less than 2 g of fiber. °What foods can I eat? °Grains °All breads and crackers made with white flour. Sweet rolls, doughnuts, waffles, pancakes, French toast, bagels. Pretzels, Melba toast, zwieback. Well-cooked cereals, such as cornmeal, farina, or cream cereals. Dry cereals that do not contain whole grains, fruit, or nuts, such as refined corn, wheat, rice, and oat cereals. Potatoes prepared any way without skins, plain pastas and noodles, refined white rice. Use white flour for baking and making sauces. Use allowed list of grains for casseroles, dumplings, and puddings. °Vegetables °Strained tomato and vegetable juices. Fresh lettuce, cucumber, spinach. Well-cooked (no skin or pulp) or canned vegetables, such as asparagus, bean sprouts, beets, carrots, green beans, mushrooms, potatoes, pumpkin, spinach, yellow squash, tomato sauce/puree, turnips,  yams, and zucchini. Keep servings limited to ½ cup. °Fruits °All fruit juices except prune juice. Cooked or canned fruits without skin and seeds, such as applesauce, apricots, cherries, fruit cocktail, grapefruit, grapes, mandarin oranges, melons, peaches, pears, pineapple, and plums. Fresh fruits without skin, such as apricots, avocados, bananas, melons, pineapple, nectarines, and peaches. Keep servings limited to ½ cup or 1 piece. °Meat and Other Protein Sources °Ground or well-cooked tender beef, ham, veal, lamb, pork, or poultry. Eggs, plain cheese. Fish, oysters, shrimp, lobster, and other seafood. Liver, organ meats. Smooth nut butters. °Dairy °All milk products and alternative dairy substitutes, such as soy, rice, almond, and coconut, not containing added whole nuts, seeds, or added fruit. °Beverages °Decaf coffee, fruit, and vegetable juices or smoothies (small amounts, with no pulp or skins, and with fruits from allowed list), sports drinks, herbal tea. °Condiments °Ketchup, mustard, vinegar, cream sauce, cheese sauce, cocoa powder. Spices in moderation, such as allspice, basil, bay leaves, celery powder or leaves, cinnamon, cumin powder, curry powder, ginger, mace, marjoram, onion or garlic powder, oregano, paprika, parsley flakes, ground pepper, rosemary, sage, savory, tarragon, thyme, and turmeric. °Sweets and Desserts °Plain cakes and cookies, pie made with allowed fruit, pudding, custard, cream pie. Gelatin, fruit, ice, sherbet, frozen ice pops. Ice cream, ice milk without nuts. Plain hard candy, honey, jelly, molasses, syrup, sugar, chocolate syrup, gumdrops, marshmallows. Limit overall sugar intake. °Fats and Oil °Margarine, butter, cream, mayonnaise, salad oils, plain salad dressings made from allowed foods. Choose healthy fats such as olive oil, canola oil, and omega-3 fatty acids (such as found in salmon or tuna) when possible. °Other °Bouillon, broth, or cream soups made from allowed foods. Any    strained soup. Casseroles or mixed dishes made with allowed foods. °The items listed above may not be a complete list of recommended foods or beverages. Contact your dietitian for more options. °What foods are not recommended? °Grains °All whole wheat and whole grain breads and crackers. Multigrains, rye, bran seeds, nuts, or coconut. Cereals containing whole grains, multigrains, bran, coconut, nuts, raisins. Cooked or dry oatmeal, steel-cut oats. Coarse wheat cereals, granola. Cereals advertised as high fiber. Potato skins. Whole grain pasta, wild or brown rice. Popcorn. Coconut flour. Bran, buckwheat, corn bread, multigrains, rye, wheat germ. °Vegetables °Fresh, cooked or canned vegetables, such as artichokes, asparagus, beet greens, broccoli, Brussels sprouts, cabbage, celery, cauliflower, corn, eggplant, kale, legumes or beans, okra, peas, and tomatoes. Avoid large servings of any vegetables, especially raw vegetables. °Fruits °Fresh fruits, such as apples with or without skin, berries, cherries, figs, grapes, grapefruit, guavas, kiwis, mangoes, oranges, papayas, pears, persimmons, pineapple, and pomegranate. Prune juice and juices with pulp, stewed or dried prunes. Dried fruits, dates, raisins. Fruit seeds or skins. Avoid large servings of all fresh fruits. °Meats and Other Protein Sources °Tough, fibrous meats with gristle. Chunky nut butter. Cheese made with seeds, nuts, or other foods not recommended. Nuts, seeds, legumes (beans, including baked beans), dried peas, beans, lentils. °Dairy °Yogurt or cheese that contains nuts, seeds, or added fruit. °Beverages °Fruit juices with high pulp, prune juice. Caffeinated coffee and teas. °Condiments °Coconut, maple syrup, pickles, olives. °Sweets and Desserts °Desserts, cookies, or candies that contain nuts or coconut, chunky peanut butter, dried fruits. Jams, preserves with seeds, marmalade. Large amounts of sugar and sweets. Any other dessert made with fruits from  the not recommended list. °Other °Soups made from vegetables that are not recommended or that contain other foods not recommended. °The items listed above may not be a complete list of foods and beverages to avoid. Contact your dietitian for more information. °This information is not intended to replace advice given to you by your health care provider. Make sure you discuss any questions you have with your health care provider. °Document Released: 12/12/2001 Document Revised: 11/28/2015 Document Reviewed: 05/15/2013 °Elsevier Interactive Patient Education © 2017 [employer] ° °

## 2017-11-12 NOTE — Clinical Social Work Placement (Signed)
   CLINICAL SOCIAL WORK PLACEMENT  NOTE  Date:  11/12/2017  Patient Details  Name: Chelsea Keller MRN: [id number] Date of Birth: Jul 26, 1934  Clinical Social Work is seeking post-discharge placement for this patient at the [place] level of care (*CSW will initial, date and re-position this form in  chart as items are completed):  Yes   Patient/family provided with [facility] Work Department's list of facilities offering this level of care within the geographic area requested by the patient (or if unable, by the patient's family).  Yes   Patient/family informed of their freedom to choose among providers that offer the needed level of care, that participate in Medicare, Medicaid or managed care program needed by the patient, have an available bed and are willing to accept the patient.  Yes   Patient/family informed of [facility]'s ownership interest in [facility] and [facility], as well as of the fact that they are under no obligation to receive care at these facilities.  PASRR submitted to EDS on       PASRR number received on       Existing PASRR number confirmed on 11/10/17     FL2 transmitted to all facilities in geographic area requested by pt/family on 11/10/17     FL2 transmitted to all facilities within larger geographic area on       Patient informed that his/her managed care company has contracts with or will negotiate with certain facilities, including the following:        Yes   Patient/family informed of bed offers received.  Patient chooses bed at [facility]     Physician recommends and patient chooses bed at      Patient to be transferred to [facility] on 11/12/17.  Patient to be transferred to facility by [facility]     Patient family notified on 11/12/17 of transfer.  Name of family member notified:  Daughter at bedside     PHYSICIAN       Additional Comment:     _______________________________________________ Benard Halsted, [place] 11/12/2017, 11:01 AM

## 2017-11-12 NOTE — Progress Notes (Signed)
[facility] Progress Note     Subjective: CC:  NAE. No complaints. States abdominal pain resolved and having flatus. Tolerating SOFT diet. According to the pt, she is being discharged to SNF today.  Objective: Vital signs in last 24 hours: Temp:  [97.5 F (36.4 C)-98.3 F (36.8 C)] 97.5 F (36.4 C) (05/10 0454) Pulse Rate:  [73-87] 76 (05/10 0454) Resp:  [17-18] 18 (05/10 0454) BP: (136-146)/(65-74) 146/74 (05/10 0454) SpO2:  [97 %-98 %] 98 % (05/10 0454) Last BM Date: 11/05/17  Intake/Output from previous day: 05/09 0701 - 05/10 0700 In: 440 [P.O.:440] Out: 350 [Urine:350] Intake/Output this shift: Total I/O In: 240 [P.O.:240] Out: -   PE: Gen:  Alert, NAD, pleasant HEENT: EOM's intact, pupils equal and round Card:  RRR Pulm:  normal effort, CTAB Abd: obese, soft, NT/ND, +BS, no HSM, no hernia Ext:  Calves soft and nontender Psych: A&Ox3  Skin: no rashes noted, warm and dry    Lab Results:  Recent Labs    11/11/17 0307 11/12/17 0514  WBC 16.6* 15.5*  HGB 9.0* 9.3*  HCT 32.0* 33.0*  PLT 355 392   BMET Recent Labs    11/10/17 0337 11/12/17 0514  NA 137 138  K 4.0 4.4  CL 103 103  CO2 27 25  GLUCOSE 125* 125*  BUN 14 18  CREATININE 0.91 0.86  CALCIUM 8.6* 9.2   PT/INR No results for input(s): LABPROT, INR in the last 72 hours. CMP     Component Value Date/Time   NA 138 11/12/2017 0514   NA 138 10/14/2017 0825   K 4.4 11/12/2017 0514   CL 103 11/12/2017 0514   CO2 25 11/12/2017 0514   GLUCOSE 125 (H) 11/12/2017 0514   GLUCOSE 186 03/10/2014 1035   BUN 18 11/12/2017 0514   BUN 35 (H) 10/14/2017 0825   CREATININE 0.86 11/12/2017 0514   CALCIUM 9.2 11/12/2017 0514   PROT 5.6 (L) 11/07/2017 0926   ALBUMIN 2.8 (L) 11/07/2017 0926   AST 22 11/07/2017 0926   ALT 12 (L) 11/07/2017 0926   ALKPHOS 76 11/07/2017 0926   BILITOT 1.0 11/07/2017 0926   GFRNONAA >60 11/12/2017 0514   GFRAA >60 11/12/2017 0514   Lipase      Component Value Date/Time   LIPASE 43 11/06/2017 1627       Studies/Results: Ct Abdomen Pelvis W Contrast  Result Date: 11/10/2017 CLINICAL DATA:  Improving abdominal pain but increasing white blood cell count with low-grade fever. Follow-up CT from 11/06/2017. EXAM: CT ABDOMEN AND PELVIS WITH CONTRAST TECHNIQUE: Multidetector CT imaging of the abdomen and pelvis was performed using the standard protocol following bolus administration of intravenous contrast. CONTRAST:  17mL OMNIPAQUE IOHEXOL 300 MG/ML  SOLN COMPARISON:  11/06/2017 FINDINGS: Lower chest: Calcified plaque over the lateral circumflex and right coronary arteries. Mild posterior bibasilar dependent atelectasis and tiny amount right pleural fluid. Hepatobiliary: Previous cholecystectomy. Liver and biliary tree are normal. Mild stable post cholecystectomy prominence of the common bile duct. Pancreas: Normal. Spleen: Normal. Adrenals/Urinary Tract: Right adrenal gland is normal. Stable 2.2 cm left adrenal mass likely an adenoma. Kidneys are normal in size without hydronephrosis or nephrolithiasis. 1 cm left renal cortical hypodensity over the lower pole too small to characterize but likely a cyst. Ureters and bladder are unremarkable. Stomach/Bowel: Stomach is normal. Resolution of the previously seen central mildly dilated small bowel loops with air with a single minimally prominent small bowel loop in the lower central abdomen measuring 2.9 cm  in diameter. Normal appendix. Diverticulosis of the colon. Vascular/Lymphatic: Moderate calcified plaque over the abdominal aorta and iliac arteries. No adenopathy. Reproductive: Previous hysterectomy. Other: No free fluid or focal inflammatory change. Musculoskeletal: Mild degenerate change of the spine and hips. IMPRESSION: Interval improvement in the previously seen prominent small bowel loops in the central mid to lower abdomen with single residual minimally prominent small bowel loops measuring 2.9  cm in greatest diameter. No evidence of obstruction. Stable 2.2 cm left adrenal mass likely an adenoma, although indeterminate. Recommend follow-up adrenal protocol CT on elective basis. Mild diverticulosis of the colon. Aortic Atherosclerosis (ICD10-I70.0). Electronically Signed   By: Marin Olp M.D.   On: 11/10/2017 19:55   Dg Fluoro Guided Needle Plc Aspiration/injection Loc  Result Date: 11/11/2017 CLINICAL DATA:  Left hip pain. EXAM: LEFT HIP INJECTION UNDER FLUOROSCOPY FLUOROSCOPY TIME:  Fluoroscopy Time:  0.07 minutes PROCEDURE: Overlying skin prepped with Betadine, draped in the usual sterile fashion, and infiltrated locally with buffered Lidocaine. Curved 22 gauge spinal needle advanced to the superolateral margin of the left femoral head. 1 ml of Lidocaine injected easily. Diagnostic injection of iodinated contrast demonstrates intra-articular spread without intravascular component. 120mg  Depo-Medrol and 5 mlSensorcaine 0.5% were then administered. No immediate complication. IMPRESSION: Technically successful left hip injection under fluoroscopy. Electronically Signed   By: Fidela Salisbury M.D.   On: 11/11/2017 14:31   Dg Fluoro Guided Needle Plc Aspiration/injection Loc  Result Date: 11/11/2017 CLINICAL DATA:  Bilateral hip joint EXAM: RIGHT HIP INJECTION UNDER FLUOROSCOPY FLUOROSCOPY TIME:  Fluoroscopy Time:  0.7 minutes PROCEDURE: Overlying skin prepped with Betadine, draped in the usual sterile fashion, and infiltrated locally with buffered Lidocaine. Curved 22 gauge spinal needle advanced to the superolateral margin of the right femoral head. 1 ml of Lidocaine injected easily. Diagnostic injection of iodinated contrastdemonstrates intra-articular spread without intravascular component. 120mg  Depo-Medrol and 5 mlSensorcaine 0.5% were then administered. No immediate complication. IMPRESSION: Technically successful right hip injection under fluoroscopy. Electronically Signed   By: Fidela Salisbury M.D.   On: 11/11/2017 14:30    Anti-infectives: Anti-infectives (From admission, onward)   Start     Dose/Rate Route Frequency Ordered Stop   11/12/17 0000  cefUROXime (CEFTIN) 500 MG tablet     500 mg Oral 2 times daily with meals 11/12/17 0944 11/19/17 2359   11/12/17 0000  metroNIDAZOLE (FLAGYL) 500 MG tablet     500 mg Oral 3 times daily 11/12/17 0944 11/19/17 2359   11/08/17 1300  metroNIDAZOLE (FLAGYL) IVPB 500 mg     500 mg 100 mL/hr over 60 Minutes Intravenous Every 8 hours 11/08/17 0836     11/07/17 1100  metroNIDAZOLE (FLAGYL) IVPB 500 mg  Status:  Discontinued     500 mg 100 mL/hr over 60 Minutes Intravenous Every 6 hours 11/07/17 0918 11/08/17 0836   11/07/17 1000  cefTRIAXone (ROCEPHIN) 2 g in sodium chloride 0.9 % 100 mL IVPB    Note to Pharmacy:  Pharmacy may adjust dosing strength / duration / interval for maximal efficacy   2 g 200 mL/hr over 30 Minutes Intravenous Every 24 hours 11/07/17 0918         Assessment/Plan Abdominal pain Acute on chronic anemia - iron def Chronic diastolic heart failure HTN Recent Pulmonary Emboli Dec 2018 on eliquis Severe obesity Mild protein calorie malnutrition HTN Fibromyalgia  SB diverticulitis vs ileus 2/2 enteritis vs psbo - MD reviewed CT scans with radiology yesterday, likely think that she had an ileus secondary to  enteritis - abdominal exam benign, tolerating soft diet - leukocytosis improved  ID - rocephin/flagyl 5/5>> FEN - soft diet, Boost VTE - SCDs, ok for chemical DVT prophyalxis from our standpoint Foley - none  Plan - agree that pt stable for discharge. Continue SOFT diet for 1-2 weeks. I have added information about low fiber diet to pts AVS. No surgical follow up necessary.    LOS: 6 days    [facility] 11/12/2017, 10:44 AM Pager: [phone] Consults: [phone] Mon-Fri 7:00 am-4:30 pm Sat-Sun 7:00 am-11:30 am

## 2017-11-12 NOTE — Care Management Note (Signed)
Case Management Note  Patient Details  Name: Chelsea Keller MRN: [id number] Date of Birth: 01-Feb-1935  Subjective/Objective:                 Patient changed her mind and agreed to SNF. Percell Locus CSW following. [place] notified.    Action/Plan:   Expected Discharge Date:  11/12/17               Expected Discharge Plan:  [place]  In-House Referral:  NA  Discharge planning Services  CM Consult  Post Acute Care Choice:  Home Health Choice offered to:  Patient  DME Arranged:  N/A(Patient has Gilford Rile, chair lift, 3in1, Electronics engineer) DME Agency:  NA  HH Arranged:  PT, OT HH Agency:  [facility]  Status of Service:  Completed, signed off  If discussed at [place] of Stay Meetings, dates discussed:    Additional Comments:  Carles Collet, RN 11/12/2017, 11:15 AM

## 2017-11-12 NOTE — Discharge Summary (Signed)
Physician Discharge Summary   Patient ID: Chelsea Keller MRN: [id number] DOB/AGE: [birth year] 81 y.o.  Admit date: 11/06/2017 Discharge date: 11/12/2017  Primary Care Physician:  Venia Carbon, MD   Recommendations for Outpatient Follow-up:  1. Follow up with PCP in 1-2 weeks 2. Please obtain BMP/CBC in one week  Home Health: None, discharging to skilled nursing facility Equipment/Devices: None  Discharge Condition: stable CODE STATUS: FULL  Diet recommendation: Soft diet   Discharge Diagnoses:    . Diverticulitis of small intestine with microperforation   ([place]) . Anemia, iron deficiency . CKD (chronic kidney disease) stage 3, GFR 30-59 ml/min (HCC) . Essential hypertension . Chronic diastolic congestive heart failure (HCC) Acute on chronic low back pain with bilateral hip pain Obesity History of pulmonary embolism Chronic lower extremity venous insufficiency    Consults: General surgery Orthopedic    Allergies:   Allergies  Allergen Reactions  . Codeine Sulfate Shortness Of Breath  . Celecoxib Other (See Comments)    Caused vaginal bleeding  . Erythromycin Base Nausea And Vomiting     DISCHARGE MEDICATIONS: Allergies as of 11/12/2017      Reactions   Codeine Sulfate Shortness Of Breath   Celecoxib Other (See Comments)   Caused vaginal bleeding   Erythromycin Base Nausea And Vomiting      Medication List    STOP taking these medications   triamterene-hydrochlorothiazide 37.5-25 MG tablet Commonly known as:  MAXZIDE-25     TAKE these medications   acetaminophen 500 MG tablet Commonly known as:  TYLENOL Take 500 mg by mouth every 6 (six) hours as needed for headache (pain).   apixaban 5 MG Tabs tablet Commonly known as:  ELIQUIS Take 1 tablet (5 mg total) by mouth 2 (two) times daily.   cefUROXime 500 MG tablet Commonly known as:  CEFTIN Take 1 tablet (500 mg total) by mouth 2 (two) times daily with a meal for 7 days.   furosemide 80 MG  tablet Commonly known as:  LASIX Take 1 tablet (80 mg total) by mouth daily. What changed:  how much to take   gabapentin 300 MG capsule Commonly known as:  NEURONTIN Take 1 capsule (300 mg total) by mouth 3 (three) times daily. What changed:    when to take this  additional instructions   hydrocortisone 2.5 % rectal cream Commonly known as:  ANUSOL-HC Apply 1 application topically 4 (four) times daily as needed for hemorrhoids.   methocarbamol 500 MG tablet Commonly known as:  ROBAXIN Take 2 tablets (1,000 mg total) by mouth every 6 (six) hours as needed for muscle spasms.   metroNIDAZOLE 500 MG tablet Commonly known as:  FLAGYL Take 1 tablet (500 mg total) by mouth 3 (three) times daily for 7 days.   ondansetron 4 MG disintegrating tablet Commonly known as:  ZOFRAN ODT Take 1 tablet (4 mg total) by mouth every 8 (eight) hours as needed for nausea or vomiting.   oxyCODONE 5 MG immediate release tablet Commonly known as:  Oxy IR/ROXICODONE Take 1-2 tablets (5-10 mg total) by mouth every 6 (six) hours as needed for up to 5 days for moderate pain.   polyethylene glycol packet Commonly known as:  MIRALAX / GLYCOLAX Take 17 g by mouth daily as needed for mild constipation or moderate constipation.   potassium chloride SA 20 MEQ tablet Commonly known as:  K-DUR,KLOR-CON TAKE 2 TABLETS (40 MEQ TOTAL) DAILY   psyllium 95 % Pack Commonly known as:  HYDROCIL/METAMUCIL Take 1 packet  by mouth daily. Start taking on:  11/13/2017   saccharomyces boulardii 250 MG capsule Commonly known as:  FLORASTOR Take 1 capsule (250 mg total) by mouth 2 (two) times daily. Any generic probiotic is okay   senna-docusate 8.6-50 MG tablet Commonly known as:  Senokot-S Take 1 tablet by mouth 2 (two) times daily.        Brief H and P: For complete details please refer to admission H and P, but in brief, 83 year old female, lives with her daughter and 3 adult grand sons, independent, PMH of  acute PE (extensive bilateral PE without right heart strain by CTA chest 06/23/2017) on Eliquis since, chronic diastolic CHF, chronic lower extremity venous insufficiency/edema, anxiety, depression, HTN, PUD, B12 deficiency, tented to [facility] ED on 5/4 with nausea and abdominal pain. CT abdomen 5/4 suggestive of PSBO. General surgery consulted and suspect jejunal diverticulitis with microperforation and PSBO.    Hospital Course:   Diverticulitis of small intestine with micro perforation versus PSBO versus ileus - CT scan on 5/4 of the abdomen showed small bowel caliber transition from jejunum to the ileum with minimal distention of the proximal small bowel suggesting partial or intermittent obstruction focus of inflammation and potential small bowel inflamed diverticulum centrally in the mesentery of the mid small bowel.  No high-grade obstructing lesion.  General surgery was consulted.   -Patient was placed on IV fluids, IV Rocephin, Flagyl.  Abdominal x-ray 5/6 showed no perforation or high-grade obstruction but mildly distended small bowel loops. -CT abdomen repeated, possibly has a ileus secondary to enteritis, no SBO.Marland Kitchen  She is now tolerating soft die -She had slight worsening of leukocytosis on 5/9, repeat blood cultures negative.  No fevers.   -Leukocytosis improving, transition to oral antibiotics Ceftin and Flagyl for 7 days.  Continue stool softeners.   Acute on chronic low back pain, bilateral hip pain: Secondary to osteo-arthritis --Lumbar spine x-ray showed moderate multilevel degenerative disc disease, hip x-rays normal. -Continue pain control, encourage PT -Orthopedics was consulted, patient underwent bilateral hip joint injections.  She was recommended to follow-up with her orthopedics, Dr. Marcelino Scot in 10 to 14 days.    Essential hypertension -Currently controlled, continue Lasix, hold HCTZ triamterene    Morbid obesity, BMI 50.0-59.9, adult ([place]) - Counseled on diet and weight  control   chronic diastolic congestive heart failure (HCC) -Currently stable, continue Lasix at home dose, stable    Anemia -Possibly iron deficiency anemia secondary to slow chronic GI blood loss, presented with hemoglobin of 7.5, no overt bleeding.  FOBT positive. -EGD 10/2014 unremarkable except for mild gastritis and colonoscopy with multiple polyps, diverticulosis and internal hemorrhoids. -Due to current diverticulitis with microperforation versus PSBO, no acute GI interventions, patient will follow-up with GI outpatient -H&H currently stable -Recommend outpatient GI follow-up.    CKD (chronic kidney disease) stage 3, GFR 30-59 ml/min (HCC) -Creatinine currently stable 0.9    History of pulmonary embolism on  Chronic anticoagulation -Diagnosed in December 2018, felt unprovoked however patient has sedentary lifestyle.  No DVT or right heart strain on echo, has been on Eliquis since. -Continue Eliquis  Chronic lower extremity venous insufficiency/edema Continue  gabapentin to 300 mg 3 times daily    Day of Discharge S: No acute issues, no fevers or chills, pain in the back and hips slightly better  BP (!) 146/74 (BP Location: Right Wrist)   Pulse 76   Temp (!) 97.5 F (36.4 C) (Oral)   Resp 18   Ht 5\' 2"  (  1.575 m)   Wt 118.7 kg (261 lb 11 oz)   SpO2 98%   BMI 47.86 kg/m   Physical Exam: General: Alert and awake oriented x3 not in any acute distress. HEENT: anicteric sclera, pupils reactive to light and accommodation CVS: S1-S2 clear no murmur rubs or gallops Chest: clear to auscultation bilaterally, no wheezing rales or rhonchi Abdomen: soft nontender, nondistended, normal bowel sounds Extremities: no cyanosis, clubbing or edema noted bilaterally Neuro: Cranial nerves II-XII intact, no focal neurological deficits   The results of significant diagnostics from this hospitalization (including imaging, microbiology, ancillary and laboratory) are listed below for  reference.      Procedures/Studies:  Dg Chest 2 View  Result Date: 11/06/2017 CLINICAL DATA:  Central chest pain. Abdominal pain since 4 a.m. Tingling in the hands. EXAM: CHEST - 2 VIEW COMPARISON:  None. FINDINGS: Heart is enlarged. There is no edema or effusion to suggest failure. Aortic atherosclerotic changes are present. Degenerative changes are again noted in the thoracic spine. Vertebral body heights are maintained. Surgical clips are present at the gallbladder fossa. IMPRESSION: 1. Stable cardiomegaly without failure. 2. Aortic atherosclerosis. 3. No acute cardiopulmonary disease. Electronically Signed   By: San Morelle M.D.   On: 11/06/2017 15:37   Dg Lumbar Spine Complete  Result Date: 11/10/2017 CLINICAL DATA:  Low back pain without known injury. EXAM: LUMBAR SPINE - COMPLETE 4+ VIEW COMPARISON:  CT scan of Nov 06, 2017. FINDINGS: No fracture or significant spondylolisthesis is noted. Osteopenia is noted. Moderate degenerative disc disease is noted at L4-5 and L5-S1. Anterior osteophyte formation is noted at L2-3 and L3-4. Atherosclerosis of abdominal aorta is noted. Degenerative changes seen involving posterior facet joints of L4-5 and L5-S1 bilaterally. IMPRESSION: Moderate multilevel degenerative disc disease. No acute abnormality seen in the lumbar spine. Electronically Signed   By: Marijo Conception, M.D.   On: 11/10/2017 10:22   Dg Ankle 2 Views Right  Result Date: 10/26/2017 CLINICAL DATA:  Pain at the lateral malleolus EXAM: RIGHT ANKLE - 2 VIEW COMPARISON:  None. FINDINGS: Diffuse soft tissue edema and scattered calcifications. No fracture or malalignment. Small plantar calcaneal spur. Minimal medial and lateral degenerative changes. No soft tissue gas. IMPRESSION: Soft tissue edema without acute osseous abnormality. Electronically Signed   By: Donavan Foil M.D.   On: 10/26/2017 14:29   Ct Abdomen Pelvis W Contrast  Result Date: 11/10/2017 CLINICAL DATA:  Improving  abdominal pain but increasing white blood cell count with low-grade fever. Follow-up CT from 11/06/2017. EXAM: CT ABDOMEN AND PELVIS WITH CONTRAST TECHNIQUE: Multidetector CT imaging of the abdomen and pelvis was performed using the standard protocol following bolus administration of intravenous contrast. CONTRAST:  169mL OMNIPAQUE IOHEXOL 300 MG/ML  SOLN COMPARISON:  11/06/2017 FINDINGS: Lower chest: Calcified plaque over the lateral circumflex and right coronary arteries. Mild posterior bibasilar dependent atelectasis and tiny amount right pleural fluid. Hepatobiliary: Previous cholecystectomy. Liver and biliary tree are normal. Mild stable post cholecystectomy prominence of the common bile duct. Pancreas: Normal. Spleen: Normal. Adrenals/Urinary Tract: Right adrenal gland is normal. Stable 2.2 cm left adrenal mass likely an adenoma. Kidneys are normal in size without hydronephrosis or nephrolithiasis. 1 cm left renal cortical hypodensity over the lower pole too small to characterize but likely a cyst. Ureters and bladder are unremarkable. Stomach/Bowel: Stomach is normal. Resolution of the previously seen central mildly dilated small bowel loops with air with a single minimally prominent small bowel loop in the lower central abdomen measuring 2.9 cm  in diameter. Normal appendix. Diverticulosis of the colon. Vascular/Lymphatic: Moderate calcified plaque over the abdominal aorta and iliac arteries. No adenopathy. Reproductive: Previous hysterectomy. Other: No free fluid or focal inflammatory change. Musculoskeletal: Mild degenerate change of the spine and hips. IMPRESSION: Interval improvement in the previously seen prominent small bowel loops in the central mid to lower abdomen with single residual minimally prominent small bowel loops measuring 2.9 cm in greatest diameter. No evidence of obstruction. Stable 2.2 cm left adrenal mass likely an adenoma, although indeterminate. Recommend follow-up adrenal protocol CT  on elective basis. Mild diverticulosis of the colon. Aortic Atherosclerosis (ICD10-I70.0). Electronically Signed   By: Marin Olp M.D.   On: 11/10/2017 19:55   Ct Abdomen Pelvis W Contrast  Result Date: 11/06/2017 CLINICAL DATA:  General abdominal pain radiates into chest. EXAM: CT ABDOMEN AND PELVIS WITH CONTRAST TECHNIQUE: Multidetector CT imaging of the abdomen and pelvis was performed using the standard protocol following bolus administration of intravenous contrast. CONTRAST:  1101mL OMNIPAQUE IOHEXOL 300 MG/ML  SOLN COMPARISON:  None. FINDINGS: Lower chest: Lung bases are clear. Hepatobiliary: No focal hepatic lesion. Postcholecystectomy. No biliary dilatation. Pancreas: Pancreas is normal. No ductal dilatation. No pancreatic inflammation. Spleen: Normal spleen Adrenals/urinary tract: Adrenal glands and kidneys are normal. The ureters and bladder normal. Stomach/Bowel: The stomach duodenum normal. There is a caliber change from the jejunum to the ileum with decompressed ileum; however the jejunum is only minimally distended to 3 cm which is upper limits of normal. No obstructing lesion identified. No mass lesion identified. No pneumatosis. There is a focus of potential extraluminal gas adjacent to the small bowel (image 84/7). This could represent inflamed small bowel diverticulum. Seen on image 83/7 and image 49/4. Terminal ileum is normal. Appendix not identified. The colon is collapsed. Several diverticula sigmoid colon. Vascular/Lymphatic: Abdominal aorta is normal caliber with atherosclerotic calcification. There is no retroperitoneal or periportal lymphadenopathy. No pelvic lymphadenopathy. The SMA and celiac trunk Sir open. Reproductive: Post hysterectomy Other: No free fluid. Musculoskeletal: No aggressive osseous lesion. IMPRESSION: 1. Small bowel caliber transition from jejunum to the ileum with minimal distention of the proximal small bowel suggest partial or intermittent obstruction. There is  a focus of inflammation and potential small bowel inflamed diverticulum centrally in the mesentery of the mid small bowel. No high-grade obstructing lesion identified. No pneumatosis or portal venous gas. The colon is collapsed. 2. Atherosclerotic calcification the aorta and branches. The SMA, celiac trunk and IMA are patent. 3. Postcholecystectomy. Electronically Signed   By: Suzy Bouchard M.D.   On: 11/06/2017 17:19   Dg Abd Acute W/chest  Result Date: 11/08/2017 CLINICAL DATA:  Abdominal and chest discomfort but no pain. Abdominal ileus. Small bowel inflammatory changes on earlier CT. EXAM: DG ABDOMEN ACUTE W/ 1V CHEST COMPARISON:  Chest x-ray dated Nov 06, 2017 FINDINGS: The lungs are well-expanded. The interstitial markings are increased. The cardiac silhouette is enlarged and the pulmonary vascularity is mildly engorged. There is no pleural effusion. There is calcification in the wall of the aortic arch. There is severe degenerative change of the right shoulder. Within the abdomen there is a moderate amount of gas within normal caliber small bowel loops with several loops of distended small bowel in the mid abdomen. The stool and gas pattern in the colon and rectum is normal. The observed bony structures are unremarkable. IMPRESSION: No evidence of perforation or high-grade obstruction. There is mild distention of several small bowel loops in the mid abdomen. Chest x-ray findings worrisome  for mild CHF. Thoracic aortic atherosclerosis. Electronically Signed   By: David  Martinique M.D.   On: 11/08/2017 10:24   Dg Fluoro Guided Needle Plc Aspiration/injection Loc  Result Date: 11/11/2017 CLINICAL DATA:  Left hip pain. EXAM: LEFT HIP INJECTION UNDER FLUOROSCOPY FLUOROSCOPY TIME:  Fluoroscopy Time:  0.07 minutes PROCEDURE: Overlying skin prepped with Betadine, draped in the usual sterile fashion, and infiltrated locally with buffered Lidocaine. Curved 22 gauge spinal needle advanced to the superolateral  margin of the left femoral head. 1 ml of Lidocaine injected easily. Diagnostic injection of iodinated contrast demonstrates intra-articular spread without intravascular component. 120mg  Depo-Medrol and 5 mlSensorcaine 0.5% were then administered. No immediate complication. IMPRESSION: Technically successful left hip injection under fluoroscopy. Electronically Signed   By: Fidela Salisbury M.D.   On: 11/11/2017 14:31   Dg Fluoro Guided Needle Plc Aspiration/injection Loc  Result Date: 11/11/2017 CLINICAL DATA:  Bilateral hip joint EXAM: RIGHT HIP INJECTION UNDER FLUOROSCOPY FLUOROSCOPY TIME:  Fluoroscopy Time:  0.7 minutes PROCEDURE: Overlying skin prepped with Betadine, draped in the usual sterile fashion, and infiltrated locally with buffered Lidocaine. Curved 22 gauge spinal needle advanced to the superolateral margin of the right femoral head. 1 ml of Lidocaine injected easily. Diagnostic injection of iodinated contrastdemonstrates intra-articular spread without intravascular component. 120mg  Depo-Medrol and 5 mlSensorcaine 0.5% were then administered. No immediate complication. IMPRESSION: Technically successful right hip injection under fluoroscopy. Electronically Signed   By: Fidela Salisbury M.D.   On: 11/11/2017 14:30   Dg Hips Bilat With Pelvis 2v  Result Date: 11/10/2017 CLINICAL DATA:  Bilateral hip pain without known injury. EXAM: DG HIP (WITH OR WITHOUT PELVIS) 2V BILAT COMPARISON:  None. FINDINGS: There is no evidence of hip fracture or dislocation. There is no evidence of arthropathy or other focal bone abnormality. IMPRESSION: Normal bilateral hips. Electronically Signed   By: Marijo Conception, M.D.   On: 11/10/2017 10:20       LAB RESULTS: Basic Metabolic Panel: Recent Labs  Lab 11/09/17 0344 11/10/17 0337 11/12/17 0514  NA 137 137 138  K 3.3* 4.0 4.4  CL 101 103 103  CO2 27 27 25   GLUCOSE 96 125* 125*  BUN 14 14 18   CREATININE 3.54 0.91 0.86  CALCIUM 8.6* 8.6* 9.2   MG 1.8  --   --    Liver Function Tests: Recent Labs  Lab 11/06/17 1627 11/07/17 0926  AST 18 22  ALT 11* 12*  ALKPHOS 77 76  BILITOT 0.9 1.0  PROT 6.2* 5.6*  ALBUMIN 3.1* 2.8*   Recent Labs  Lab 11/06/17 1627  LIPASE 43   No results for input(s): AMMONIA in the last 168 hours. CBC: Recent Labs  Lab 11/11/17 0307 11/12/17 0514  WBC 16.6* 15.5*  HGB 9.0* 9.3*  HCT 32.0* 33.0*  MCV 80.0 80.3  PLT 355 392   Cardiac Enzymes: No results for input(s): CKTOTAL, CKMB, CKMBINDEX, TROPONINI in the last 168 hours. BNP: Invalid input(s): POCBNP CBG: No results for input(s): GLUCAP in the last 168 hours.    Disposition and Follow-up: Discharge Instructions    Diet - low sodium heart healthy   Complete by:  As directed    Increase activity slowly   Complete by:  As directed        DISPOSITION: [facility] information for follow-up providers    Venia Carbon, MD. Schedule an appointment as soon as possible for a visit in 2 week(s).   Specialties:  Internal Medicine, Pediatrics Contact information: [place] [address] [phone]        Burnell Blanks, MD .   Specialty:  Cardiology Contact information: [place]. [address] [place] [address] [phone]        Altamese Russell, MD. Schedule an appointment as soon as possible for a visit in 2 week(s).   Specialty:  Orthopedic Surgery Contact information: [place] [address] [place] [address] [phone]            Contact information for after-discharge care    Destination    [facility] .   Service:  Skilled Nursing Contact information: [address] [place] [phone]                   Time coordinating discharge:  40-minutes  Signed:   Estill Cotta M.D. [facility] 11/12/2017, 10:37 AM Pager:  [phone]

## 2017-11-12 NOTE — Progress Notes (Signed)
[name] to be D/C'd Skilled nursing facility per MD order.  Discussed with the patient and all questions fully answered.  VSS, Skin clean, dry and intact without evidence of skin break down, no evidence of skin tears noted. IV catheter discontinued intact. Site without signs and symptoms of complications. Dressing and pressure applied.  An After Visit Summary was printed and given to the patient.   D/c education completed with patient/family including follow up instructions, medication list, d/c activities limitations if indicated, with other d/c instructions as indicated by MD - patient able to verbalize understanding, all questions fully answered.   Patient instructed to return to ED, call 911, or call MD for any changes in condition.   Patient escorted via [facility] and report called to tashsa at [facility].   Betha Loa Wavie Hashimi 11/12/2017 4:11 PM

## 2017-11-12 NOTE — Progress Notes (Signed)
Patient will DC to: [facility] Anticipated DC date: 11/12/17 Family notified: Daughter Transport by: Veleta Miners   Per MD patient ready for DC to [facility]. RN, patient, patient's family, and facility notified of DC. Discharge Summary sent to facility. RN given number for report [phone]). DC packet on chart. Ambulance transport requested for patient.   CSW signing off.  Cedric Fishman, LCSW Clinical Social Worker [phone]

## 2017-11-12 NOTE — Progress Notes (Signed)
Orthopedic Trauma Service Progress Note   Patient ID: Chelsea Keller MRN: [id number] DOB/AGE: Jul 03, 1935 83 y.o.  Subjective:  Received fluoroscopy guided corticosteroid injections to each hip yesterday around 1400  States she has not had any relief yet, however pt appears much happier today than she did 2 days ago when I saw her. Upon review the steroid mixture contained depomedrol and marcaine. I am not surprised she has not had any significant relief at this point  We also discussed that she has essentially been bed ridden for 6 days and she is going to be stiff and sore irrespective of acute arthritic flares  She complains of R knee stiffness as well which again I explained to her is likely related to the fact that she has been in bed for nearly a week   Pt needs to get up to a chair today   She is agreeable to SNF at this time, [facility] Despite pt living with family, all her family works and there are likely times that she will be by herself  Until pt can mobilize effectively with her walker or transfer self to wheelchair, a SNF is the safest place for her   We also discussed that should the injections fail to provide meaningful relief that her symptoms are likely related to Lumbar DDD and chronic SI joint arthritis.  We could also attempt injections here as well but as she has received 240 mg of depomedrol between her 2 hips we would likely wait a week or so before trying this    ROS As above    Objective:   VITALS:   Vitals:   11/11/17 0600 11/11/17 1520 11/11/17 2128 11/12/17 0454  BP: (!) 123/57 (!) 143/65 136/70 (!) 146/74  Pulse: 76 87 73 76  Resp: 17 17 18 18   Temp: 99.8 F (37.7 C) 97.9 F (36.6 C) 98.3 F (36.8 C) (!) 97.5 F (36.4 C)  TempSrc: Oral Oral Oral Oral  SpO2: 91% 98% 97% 98%  Weight:      Height:        Estimated body mass index is 47.86 kg/m as calculated from the following:   Height as of this encounter: 5'  2" (1.575 m).   Weight as of this encounter: 118.7 kg (261 lb 11 oz).   Intake/Output      05/09 0701 - 05/10 0700 05/10 0701 - 05/11 0700   P.O. 440 240   IV Piggyback     Total Intake(mL/kg) 440 (3.7) 240 (2)   Urine (mL/kg/hr) 350 (0.1)    Total Output 350    Net +90 +240          LABS  Results for orders placed or performed during the hospital encounter of 11/06/17 (from the past 24 hour(s))  Basic metabolic panel     Status: Abnormal   Collection Time: 11/12/17  5:14 AM  Result Value Ref Range   Sodium 138 135 - 145 mmol/L   Potassium 4.4 3.5 - 5.1 mmol/L   Chloride 103 101 - 111 mmol/L   CO2 25 22 - 32 mmol/L   Glucose, Bld 125 (H) 65 - 99 mg/dL   BUN 18 6 - 20 mg/dL   Creatinine, Ser 0.86 0.44 - 1.00 mg/dL   Calcium 9.2 8.9 - 10.3 mg/dL   GFR calc non Af Amer >60 >60 mL/min   GFR calc Af Amer >60 >60 mL/min   Anion gap 10 5 - 15  CBC  Status: Abnormal   Collection Time: 11/12/17  5:14 AM  Result Value Ref Range   WBC 15.5 (H) 4.0 - 10.5 K/uL   RBC 4.11 3.87 - 5.11 MIL/uL   Hemoglobin 9.3 (L) 12.0 - 15.0 g/dL   HCT 33.0 (L) 36.0 - 46.0 %   MCV 80.3 78.0 - 100.0 fL   MCH 22.6 (L) 26.0 - 34.0 pg   MCHC 28.2 (L) 30.0 - 36.0 g/dL   RDW 19.9 (H) 11.5 - 15.5 %   Platelets 392 150 - 400 K/uL  Glucose, capillary     Status: Abnormal   Collection Time: 11/12/17 10:56 AM  Result Value Ref Range   Glucose-Capillary 126 (H) 65 - 99 mg/dL     PHYSICAL EXAM:   Gen: appears more comfortable than a few days ago.  Ext:       B LEx  B edema very well controlled  Her chronic LEx cellulitis is not as pronounced, mild erythema on the R but minimal and nonpainful  Distal motor and sensory functions intact  Moving her lower extremities easier today   + DP pulse   PVD B LEx stable   Assessment/Plan:     Principal Problem:   Diverticulitis of small intestine with perforation Active Problems:   Essential hypertension   Venous (peripheral) insufficiency   BMI  50.0-59.9, adult (HCC)   Chronic diastolic congestive heart failure (HCC)   Anemia   CKD (chronic kidney disease) stage 3, GFR 30-59 ml/min (HCC)   Small bowel obstruction (HCC)   History of pulmonary embolism   Chronic anticoagulation   Degenerative joint disease (DJD) of hip, Bilateral    DDD (degenerative disc disease), lumbar   Arthritis of sacroiliac joint of both sides   Anti-infectives (From admission, onward)   Start     Dose/Rate Route Frequency Ordered Stop   11/12/17 0000  cefUROXime (CEFTIN) 500 MG tablet     500 mg Oral 2 times daily with meals 11/12/17 0944 11/19/17 2359   11/12/17 0000  metroNIDAZOLE (FLAGYL) 500 MG tablet     500 mg Oral 3 times daily 11/12/17 0944 11/19/17 2359   11/08/17 1300  metroNIDAZOLE (FLAGYL) IVPB 500 mg     500 mg 100 mL/hr over 60 Minutes Intravenous Every 8 hours 11/08/17 0836     11/07/17 1100  metroNIDAZOLE (FLAGYL) IVPB 500 mg  Status:  Discontinued     500 mg 100 mL/hr over 60 Minutes Intravenous Every 6 hours 11/07/17 0918 11/08/17 0836   11/07/17 1000  cefTRIAXone (ROCEPHIN) 2 g in sodium chloride 0.9 % 100 mL IVPB    Note to Pharmacy:  Pharmacy may adjust dosing strength / duration / interval for maximal efficacy   2 g 200 mL/hr over 30 Minutes Intravenous Every 24 hours 11/07/17 0918         83 y/o female admitted for Small Bowel diverticulitis vs ileus vs SBO on 11/06/2017 with further deconditioning and acute arthritic flares    - chronic poly-arthralgias due to arthritis, severe deconditioning    S/p B hip corticosteroid injections with fluoroscopic guidance    Continue to monitor   Need to wait at least 3-4 days minimum to say whether or not there is a failure of therapy.    Her exam favored hip arthritis so I am optimistic that she will get some relief. It may not be complete relief but hopefully enough to allow her to mobilize and ambulate again       Again  MRIs from 6 months ago also show lumbar pathology and B SI  joint arthritis as well. These are likely players in her overall clinical status as well. The hip injections will allow [place] to at least see how much her back and pelvis are at play here as well                           continue to refrain from escalating opioid use    Ice PRN hips                          due to the amount of steroid used, I did order CBGs to be obtained q8h to monitor for acute changes    Pt is agreeable to SNF    SW consult for SNF      Pt needs to get to the chair at a minimum today         - Dispo:             Per medicine and general surgery     Follow up with ortho PRN   Jari Pigg, PA-C [facility] [phone] (O) [phone] (C) 11/12/2017, 11:13 AM

## 2017-11-15 DIAGNOSIS — M25552 Pain in left hip: Secondary | ICD-10-CM | POA: Diagnosis not present

## 2017-11-15 DIAGNOSIS — I10 Essential (primary) hypertension: Secondary | ICD-10-CM | POA: Diagnosis not present

## 2017-11-15 DIAGNOSIS — M792 Neuralgia and neuritis, unspecified: Secondary | ICD-10-CM | POA: Diagnosis not present

## 2017-11-15 DIAGNOSIS — G8929 Other chronic pain: Secondary | ICD-10-CM | POA: Diagnosis not present

## 2017-11-15 DIAGNOSIS — M25551 Pain in right hip: Secondary | ICD-10-CM | POA: Diagnosis not present

## 2017-11-15 DIAGNOSIS — K5792 Diverticulitis of intestine, part unspecified, without perforation or abscess without bleeding: Secondary | ICD-10-CM | POA: Diagnosis not present

## 2017-11-15 DIAGNOSIS — R2689 Other abnormalities of gait and mobility: Secondary | ICD-10-CM | POA: Diagnosis not present

## 2017-11-16 LAB — CULTURE, BLOOD (ROUTINE X 2)
Culture: NO GROWTH
Culture: NO GROWTH
Special Requests: ADEQUATE
Special Requests: ADEQUATE

## 2017-11-18 DIAGNOSIS — K5792 Diverticulitis of intestine, part unspecified, without perforation or abscess without bleeding: Secondary | ICD-10-CM | POA: Diagnosis not present

## 2017-11-18 DIAGNOSIS — I10 Essential (primary) hypertension: Secondary | ICD-10-CM | POA: Diagnosis not present

## 2017-11-18 DIAGNOSIS — D649 Anemia, unspecified: Secondary | ICD-10-CM | POA: Diagnosis not present

## 2017-11-19 DIAGNOSIS — M25552 Pain in left hip: Secondary | ICD-10-CM | POA: Diagnosis not present

## 2017-11-19 DIAGNOSIS — R2689 Other abnormalities of gait and mobility: Secondary | ICD-10-CM | POA: Diagnosis not present

## 2017-11-22 DIAGNOSIS — R2689 Other abnormalities of gait and mobility: Secondary | ICD-10-CM | POA: Diagnosis not present

## 2017-11-22 DIAGNOSIS — M25552 Pain in left hip: Secondary | ICD-10-CM | POA: Diagnosis not present

## 2017-11-24 ENCOUNTER — Ambulatory Visit: Payer: Medicare Other | Admitting: Internal Medicine

## 2017-11-24 DIAGNOSIS — R2689 Other abnormalities of gait and mobility: Secondary | ICD-10-CM | POA: Diagnosis not present

## 2017-11-24 DIAGNOSIS — M25552 Pain in left hip: Secondary | ICD-10-CM | POA: Diagnosis not present

## 2017-11-26 DIAGNOSIS — M25552 Pain in left hip: Secondary | ICD-10-CM | POA: Diagnosis not present

## 2017-11-26 DIAGNOSIS — R2689 Other abnormalities of gait and mobility: Secondary | ICD-10-CM | POA: Diagnosis not present

## 2017-12-01 ENCOUNTER — Telehealth: Payer: Self-pay | Admitting: Internal Medicine

## 2017-12-01 DIAGNOSIS — M25552 Pain in left hip: Secondary | ICD-10-CM | POA: Diagnosis not present

## 2017-12-01 DIAGNOSIS — R2689 Other abnormalities of gait and mobility: Secondary | ICD-10-CM | POA: Diagnosis not present

## 2017-12-01 NOTE — Telephone Encounter (Signed)
Pt scheduled hosp f/u on 6/10 at 4:00. Do you want this as 30 min or 15?

## 2017-12-02 NOTE — Telephone Encounter (Signed)
15 minutes is enough

## 2017-12-03 ENCOUNTER — Telehealth: Payer: Self-pay

## 2017-12-03 DIAGNOSIS — I872 Venous insufficiency (chronic) (peripheral): Secondary | ICD-10-CM | POA: Diagnosis not present

## 2017-12-03 DIAGNOSIS — K5792 Diverticulitis of intestine, part unspecified, without perforation or abscess without bleeding: Secondary | ICD-10-CM | POA: Diagnosis not present

## 2017-12-03 DIAGNOSIS — M797 Fibromyalgia: Secondary | ICD-10-CM | POA: Diagnosis not present

## 2017-12-03 DIAGNOSIS — N183 Chronic kidney disease, stage 3 (moderate): Secondary | ICD-10-CM | POA: Diagnosis not present

## 2017-12-03 DIAGNOSIS — R488 Other symbolic dysfunctions: Secondary | ICD-10-CM | POA: Diagnosis not present

## 2017-12-03 DIAGNOSIS — M549 Dorsalgia, unspecified: Secondary | ICD-10-CM | POA: Diagnosis not present

## 2017-12-03 DIAGNOSIS — Z86711 Personal history of pulmonary embolism: Secondary | ICD-10-CM | POA: Diagnosis not present

## 2017-12-03 DIAGNOSIS — R278 Other lack of coordination: Secondary | ICD-10-CM | POA: Diagnosis not present

## 2017-12-03 DIAGNOSIS — D509 Iron deficiency anemia, unspecified: Secondary | ICD-10-CM | POA: Diagnosis not present

## 2017-12-03 DIAGNOSIS — E785 Hyperlipidemia, unspecified: Secondary | ICD-10-CM | POA: Diagnosis not present

## 2017-12-03 DIAGNOSIS — R2689 Other abnormalities of gait and mobility: Secondary | ICD-10-CM | POA: Diagnosis not present

## 2017-12-03 DIAGNOSIS — I130 Hypertensive heart and chronic kidney disease with heart failure and stage 1 through stage 4 chronic kidney disease, or unspecified chronic kidney disease: Secondary | ICD-10-CM | POA: Diagnosis not present

## 2017-12-03 DIAGNOSIS — I509 Heart failure, unspecified: Secondary | ICD-10-CM | POA: Diagnosis not present

## 2017-12-03 DIAGNOSIS — M6281 Muscle weakness (generalized): Secondary | ICD-10-CM | POA: Diagnosis not present

## 2017-12-03 NOTE — Telephone Encounter (Signed)
Copied from [place] [id number]. Topic: General - Other >> Dec 03, 2017  2:24 PM Yvette Rack wrote: Reason for CRM: Sonja with [facility] called in requesting verbal orders for Skilled Nursing for 1 time a week for 4 weeks. Sonja advised that pt has refused Occupational Therapy that was ordered. Cb# [phone]

## 2017-12-04 NOTE — Telephone Encounter (Signed)
That is fine 

## 2017-12-06 ENCOUNTER — Telehealth: Payer: Self-pay | Admitting: Internal Medicine

## 2017-12-06 DIAGNOSIS — I509 Heart failure, unspecified: Secondary | ICD-10-CM | POA: Diagnosis not present

## 2017-12-06 DIAGNOSIS — M797 Fibromyalgia: Secondary | ICD-10-CM | POA: Diagnosis not present

## 2017-12-06 DIAGNOSIS — N183 Chronic kidney disease, stage 3 (moderate): Secondary | ICD-10-CM | POA: Diagnosis not present

## 2017-12-06 DIAGNOSIS — K5792 Diverticulitis of intestine, part unspecified, without perforation or abscess without bleeding: Secondary | ICD-10-CM | POA: Diagnosis not present

## 2017-12-06 DIAGNOSIS — I130 Hypertensive heart and chronic kidney disease with heart failure and stage 1 through stage 4 chronic kidney disease, or unspecified chronic kidney disease: Secondary | ICD-10-CM | POA: Diagnosis not present

## 2017-12-06 DIAGNOSIS — M6281 Muscle weakness (generalized): Secondary | ICD-10-CM | POA: Diagnosis not present

## 2017-12-06 NOTE — Telephone Encounter (Signed)
Verbal orders given to [name] 

## 2017-12-06 NOTE — Telephone Encounter (Signed)
okay

## 2017-12-06 NOTE — Telephone Encounter (Signed)
Copied from [place] [phone]. Topic: Quick Communication - See Telephone Encounter >> Dec 06, 2017  3:37 PM Nils Flack wrote: CRM for notification. See Telephone encounter for: 12/06/17. Je[facility] Hawking [facility] called Verbal  PT  2 week 3  Cb is [phone]

## 2017-12-06 NOTE — Telephone Encounter (Signed)
Verbal orders left on VM for [facility]

## 2017-12-09 DIAGNOSIS — N183 Chronic kidney disease, stage 3 (moderate): Secondary | ICD-10-CM | POA: Diagnosis not present

## 2017-12-09 DIAGNOSIS — M797 Fibromyalgia: Secondary | ICD-10-CM | POA: Diagnosis not present

## 2017-12-09 DIAGNOSIS — M6281 Muscle weakness (generalized): Secondary | ICD-10-CM | POA: Diagnosis not present

## 2017-12-09 DIAGNOSIS — I130 Hypertensive heart and chronic kidney disease with heart failure and stage 1 through stage 4 chronic kidney disease, or unspecified chronic kidney disease: Secondary | ICD-10-CM | POA: Diagnosis not present

## 2017-12-09 DIAGNOSIS — K5792 Diverticulitis of intestine, part unspecified, without perforation or abscess without bleeding: Secondary | ICD-10-CM | POA: Diagnosis not present

## 2017-12-09 DIAGNOSIS — I509 Heart failure, unspecified: Secondary | ICD-10-CM | POA: Diagnosis not present

## 2017-12-10 ENCOUNTER — Telehealth: Payer: Self-pay

## 2017-12-10 DIAGNOSIS — M6281 Muscle weakness (generalized): Secondary | ICD-10-CM | POA: Diagnosis not present

## 2017-12-10 DIAGNOSIS — M797 Fibromyalgia: Secondary | ICD-10-CM | POA: Diagnosis not present

## 2017-12-10 DIAGNOSIS — K5792 Diverticulitis of intestine, part unspecified, without perforation or abscess without bleeding: Secondary | ICD-10-CM | POA: Diagnosis not present

## 2017-12-10 DIAGNOSIS — N183 Chronic kidney disease, stage 3 (moderate): Secondary | ICD-10-CM | POA: Diagnosis not present

## 2017-12-10 DIAGNOSIS — I130 Hypertensive heart and chronic kidney disease with heart failure and stage 1 through stage 4 chronic kidney disease, or unspecified chronic kidney disease: Secondary | ICD-10-CM | POA: Diagnosis not present

## 2017-12-10 DIAGNOSIS — I509 Heart failure, unspecified: Secondary | ICD-10-CM | POA: Diagnosis not present

## 2017-12-10 NOTE — Telephone Encounter (Signed)
Copied from [place] [phone]. Topic: General - Other >> Dec 10, 2017  4:08 PM Carolyn Stare wrote:  Sherlynn Stalls with [facility] call to say pt decline OT  Evaluation

## 2017-12-11 NOTE — Telephone Encounter (Signed)
Noted  

## 2017-12-13 ENCOUNTER — Inpatient Hospital Stay: Payer: Medicare Other | Admitting: Internal Medicine

## 2017-12-14 DIAGNOSIS — I130 Hypertensive heart and chronic kidney disease with heart failure and stage 1 through stage 4 chronic kidney disease, or unspecified chronic kidney disease: Secondary | ICD-10-CM | POA: Diagnosis not present

## 2017-12-14 DIAGNOSIS — M797 Fibromyalgia: Secondary | ICD-10-CM | POA: Diagnosis not present

## 2017-12-14 DIAGNOSIS — N183 Chronic kidney disease, stage 3 (moderate): Secondary | ICD-10-CM | POA: Diagnosis not present

## 2017-12-14 DIAGNOSIS — M6281 Muscle weakness (generalized): Secondary | ICD-10-CM | POA: Diagnosis not present

## 2017-12-14 DIAGNOSIS — I509 Heart failure, unspecified: Secondary | ICD-10-CM | POA: Diagnosis not present

## 2017-12-14 DIAGNOSIS — K5792 Diverticulitis of intestine, part unspecified, without perforation or abscess without bleeding: Secondary | ICD-10-CM | POA: Diagnosis not present

## 2017-12-16 DIAGNOSIS — M6281 Muscle weakness (generalized): Secondary | ICD-10-CM | POA: Diagnosis not present

## 2017-12-16 DIAGNOSIS — I130 Hypertensive heart and chronic kidney disease with heart failure and stage 1 through stage 4 chronic kidney disease, or unspecified chronic kidney disease: Secondary | ICD-10-CM | POA: Diagnosis not present

## 2017-12-16 DIAGNOSIS — N183 Chronic kidney disease, stage 3 (moderate): Secondary | ICD-10-CM | POA: Diagnosis not present

## 2017-12-16 DIAGNOSIS — I509 Heart failure, unspecified: Secondary | ICD-10-CM | POA: Diagnosis not present

## 2017-12-16 DIAGNOSIS — M797 Fibromyalgia: Secondary | ICD-10-CM | POA: Diagnosis not present

## 2017-12-16 DIAGNOSIS — K5792 Diverticulitis of intestine, part unspecified, without perforation or abscess without bleeding: Secondary | ICD-10-CM | POA: Diagnosis not present

## 2017-12-17 DIAGNOSIS — K5792 Diverticulitis of intestine, part unspecified, without perforation or abscess without bleeding: Secondary | ICD-10-CM | POA: Diagnosis not present

## 2017-12-17 DIAGNOSIS — M6281 Muscle weakness (generalized): Secondary | ICD-10-CM | POA: Diagnosis not present

## 2017-12-17 DIAGNOSIS — I130 Hypertensive heart and chronic kidney disease with heart failure and stage 1 through stage 4 chronic kidney disease, or unspecified chronic kidney disease: Secondary | ICD-10-CM | POA: Diagnosis not present

## 2017-12-17 DIAGNOSIS — N183 Chronic kidney disease, stage 3 (moderate): Secondary | ICD-10-CM | POA: Diagnosis not present

## 2017-12-17 DIAGNOSIS — I509 Heart failure, unspecified: Secondary | ICD-10-CM | POA: Diagnosis not present

## 2017-12-17 DIAGNOSIS — M797 Fibromyalgia: Secondary | ICD-10-CM | POA: Diagnosis not present

## 2017-12-20 DIAGNOSIS — M6281 Muscle weakness (generalized): Secondary | ICD-10-CM | POA: Diagnosis not present

## 2017-12-20 DIAGNOSIS — N183 Chronic kidney disease, stage 3 (moderate): Secondary | ICD-10-CM | POA: Diagnosis not present

## 2017-12-20 DIAGNOSIS — I130 Hypertensive heart and chronic kidney disease with heart failure and stage 1 through stage 4 chronic kidney disease, or unspecified chronic kidney disease: Secondary | ICD-10-CM | POA: Diagnosis not present

## 2017-12-20 DIAGNOSIS — K5792 Diverticulitis of intestine, part unspecified, without perforation or abscess without bleeding: Secondary | ICD-10-CM | POA: Diagnosis not present

## 2017-12-20 DIAGNOSIS — I509 Heart failure, unspecified: Secondary | ICD-10-CM | POA: Diagnosis not present

## 2017-12-20 DIAGNOSIS — M797 Fibromyalgia: Secondary | ICD-10-CM | POA: Diagnosis not present

## 2017-12-22 DIAGNOSIS — I509 Heart failure, unspecified: Secondary | ICD-10-CM | POA: Diagnosis not present

## 2017-12-22 DIAGNOSIS — I872 Venous insufficiency (chronic) (peripheral): Secondary | ICD-10-CM | POA: Diagnosis not present

## 2017-12-22 DIAGNOSIS — I130 Hypertensive heart and chronic kidney disease with heart failure and stage 1 through stage 4 chronic kidney disease, or unspecified chronic kidney disease: Secondary | ICD-10-CM | POA: Diagnosis not present

## 2017-12-22 DIAGNOSIS — Z86711 Personal history of pulmonary embolism: Secondary | ICD-10-CM

## 2017-12-22 DIAGNOSIS — R278 Other lack of coordination: Secondary | ICD-10-CM | POA: Diagnosis not present

## 2017-12-22 DIAGNOSIS — N183 Chronic kidney disease, stage 3 (moderate): Secondary | ICD-10-CM | POA: Diagnosis not present

## 2017-12-22 DIAGNOSIS — R2689 Other abnormalities of gait and mobility: Secondary | ICD-10-CM | POA: Diagnosis not present

## 2017-12-22 DIAGNOSIS — K5792 Diverticulitis of intestine, part unspecified, without perforation or abscess without bleeding: Secondary | ICD-10-CM | POA: Diagnosis not present

## 2017-12-22 DIAGNOSIS — M797 Fibromyalgia: Secondary | ICD-10-CM | POA: Diagnosis not present

## 2017-12-22 DIAGNOSIS — R488 Other symbolic dysfunctions: Secondary | ICD-10-CM

## 2017-12-22 DIAGNOSIS — M549 Dorsalgia, unspecified: Secondary | ICD-10-CM | POA: Diagnosis not present

## 2017-12-22 DIAGNOSIS — D509 Iron deficiency anemia, unspecified: Secondary | ICD-10-CM | POA: Diagnosis not present

## 2017-12-22 DIAGNOSIS — E785 Hyperlipidemia, unspecified: Secondary | ICD-10-CM | POA: Diagnosis not present

## 2017-12-22 DIAGNOSIS — M6281 Muscle weakness (generalized): Secondary | ICD-10-CM | POA: Diagnosis not present

## 2017-12-23 ENCOUNTER — Other Ambulatory Visit: Payer: Self-pay | Admitting: Cardiovascular Disease

## 2017-12-23 DIAGNOSIS — M6281 Muscle weakness (generalized): Secondary | ICD-10-CM | POA: Diagnosis not present

## 2017-12-23 DIAGNOSIS — I130 Hypertensive heart and chronic kidney disease with heart failure and stage 1 through stage 4 chronic kidney disease, or unspecified chronic kidney disease: Secondary | ICD-10-CM | POA: Diagnosis not present

## 2017-12-23 DIAGNOSIS — I509 Heart failure, unspecified: Secondary | ICD-10-CM | POA: Diagnosis not present

## 2017-12-23 DIAGNOSIS — K5792 Diverticulitis of intestine, part unspecified, without perforation or abscess without bleeding: Secondary | ICD-10-CM | POA: Diagnosis not present

## 2017-12-23 DIAGNOSIS — M797 Fibromyalgia: Secondary | ICD-10-CM | POA: Diagnosis not present

## 2017-12-23 DIAGNOSIS — N183 Chronic kidney disease, stage 3 (moderate): Secondary | ICD-10-CM | POA: Diagnosis not present

## 2017-12-29 ENCOUNTER — Ambulatory Visit (INDEPENDENT_AMBULATORY_CARE_PROVIDER_SITE_OTHER): Payer: Medicare Other | Admitting: Internal Medicine

## 2017-12-29 ENCOUNTER — Encounter: Payer: Self-pay | Admitting: Internal Medicine

## 2017-12-29 VITALS — BP 108/60 | HR 89 | Temp 98.0°F | Ht 62.0 in | Wt 256.0 lb

## 2017-12-29 DIAGNOSIS — Z86711 Personal history of pulmonary embolism: Secondary | ICD-10-CM

## 2017-12-29 DIAGNOSIS — I5032 Chronic diastolic (congestive) heart failure: Secondary | ICD-10-CM

## 2017-12-29 DIAGNOSIS — I872 Venous insufficiency (chronic) (peripheral): Secondary | ICD-10-CM

## 2017-12-29 DIAGNOSIS — K5700 Diverticulitis of small intestine with perforation and abscess without bleeding: Secondary | ICD-10-CM | POA: Diagnosis not present

## 2017-12-29 NOTE — Assessment & Plan Note (Signed)
Resolved Out of rehab and home again No intestinal problems now

## 2017-12-29 NOTE — Progress Notes (Signed)
Subjective:    Patient ID: Chelsea Keller, female    DOB: [birth year], 83 y.o.   MRN: [id number]  HPI Here with daughter for follow up of hospital stay and rehab  Was hospitalized with apparent diverticulitis and small perforation Surgery deferred and then improved Better with antibiotic Had been moving bowels 4-5 times a day on the antibiotics---back to normal now Had fatigue and couldn't walk--so rehab needed  [facility] for rehab--- 20 days Home since 5/30 Just finished with home PT---didn't want the OT Nurse had been visiting as well  Had some bad left hip pain during the hospital stay This is better now Likely S-I related pain Got shot and used lidocaine patches  Breathing has been okay Taking the furosemide to every other day---due to the higher dose Will void for 4 hours straight after taking Chronic edema---especially right calf Breathing is okay  Still in upstairs bedroom Uses chair lift Independent with ADLs--and helps cook dinner, etc  Current Outpatient Medications on File Prior to Visit  Medication Sig Dispense Refill  . acetaminophen (TYLENOL) 500 MG tablet Take 500 mg by mouth every 6 (six) hours as needed for headache (pain).    Marland Kitchen apixaban (ELIQUIS) 5 MG TABS tablet Take 1 tablet (5 mg total) by mouth 2 (two) times daily. 30 tablet 0  . furosemide (LASIX) 80 MG tablet Take 1 tablet (80 mg total) by mouth daily. (Patient taking differently: Take 160 mg by mouth daily. ) 30 tablet 6  . gabapentin (NEURONTIN) 300 MG capsule Take 1 capsule (300 mg total) by mouth 3 (three) times daily. (Patient taking differently: Take 300 mg by mouth See admin instructions. Tapering up to 300 mg three times daily: 5/4, 5/5, 5/6 - take one capsule (300 mg) every morning and two 100 mg capsules at bedtime; 5/7, 5/8, 5/9 take one capsule (300 mg) twice daily, 5/10, 5/11, 5/12, take one capsule (300 mg) every morning and one capsule (300 mg) with a 100 mg capsule at bedtime, 5/13,  5/14, 5/15 - take 800 mg during the day (unknown directions), 5/16 and going forward take one capsule (300 mg) three times daily) 270 capsule 3  . hydrocortisone (ANUSOL-HC) 2.5 % rectal cream Apply 1 application topically 4 (four) times daily as needed for hemorrhoids. 30 g 0  . methocarbamol (ROBAXIN) 500 MG tablet Take 2 tablets (1,000 mg total) by mouth every 6 (six) hours as needed for muscle spasms. 180 tablet 0  . ondansetron (ZOFRAN ODT) 4 MG disintegrating tablet Take 1 tablet (4 mg total) by mouth every 8 (eight) hours as needed for nausea or vomiting. 20 tablet 0  . polyethylene glycol (MIRALAX / GLYCOLAX) packet Take 17 g by mouth daily as needed for mild constipation or moderate constipation. 14 each 0  . potassium chloride SA (K-DUR,KLOR-CON) 20 MEQ tablet TAKE 2 TABLETS (40 MEQ TOTAL) DAILY 180 tablet 3  . psyllium (HYDROCIL/METAMUCIL) 95 % PACK Take 1 packet by mouth daily. 56 each 1  . saccharomyces boulardii (FLORASTOR) 250 MG capsule Take 1 capsule (250 mg total) by mouth 2 (two) times daily. Any generic probiotic is okay 60 capsule 0  . senna-docusate (SENOKOT-S) 8.6-50 MG tablet Take 1 tablet by mouth 2 (two) times daily. 60 tablet 0   No current facility-administered medications on file prior to visit.     Allergies  Allergen Reactions  . Codeine Sulfate Shortness Of Breath  . Celecoxib Other (See Comments)    Caused vaginal bleeding  . Erythromycin  Base Nausea And Vomiting    Past Medical History:  Diagnosis Date  . Allergy   . Anemia   . Anxiety   . Arthritis   . Arthritis of sacroiliac joint of both sides 11/12/2017  . Bilateral pulmonary embolism ([place]) 06/23/2017  . Chronic diastolic CHF (congestive heart failure) (HCC)    a. Echo 1/16:  mild LVH, EF normal, grade 1 DD, MAC  . Chronic venous insufficiency    chronic LE edema  . DDD (degenerative disc disease), lumbar 11/12/2017  . Degenerative joint disease (DJD) of hip, Bilateral  11/12/2017  . Depression     . Fibromyalgia    constant pain  . Hx of cardiac catheterization    a. LHC in [place] "ok" per patient with mild plaque in a single vessel - records not available  . Hx of cardiovascular stress test    a. Nuclear study in 2008 normal  . Hx of colonic polyps   . Hypertension   . Hypertriglyceridemia   . Impaired fasting glucose   . PONV (postoperative nausea and vomiting)   . PUD (peptic ulcer disease)    hx of gastric ulcer  . Pulmonary emboli ([employer]) 06/2017  . Vitamin B12 deficiency     Past Surgical History:  Procedure Laterality Date  . ABDOMINAL HYSTERECTOMY    . CATARACT EXTRACTION  10/2003   OD  . CHOLECYSTECTOMY    . COLONOSCOPY W/ POLYPECTOMY    . COLONOSCOPY WITH PROPOFOL N/A 10/15/2014   Procedure: COLONOSCOPY WITH PROPOFOL;  Surgeon: Ladene Artist, MD;  Location: [facility];  Service: Endoscopy;  Laterality: N/A;  . ESOPHAGOGASTRODUODENOSCOPY (EGD) WITH PROPOFOL N/A 10/15/2014   Procedure: ESOPHAGOGASTRODUODENOSCOPY (EGD) WITH PROPOFOL;  Surgeon: Ladene Artist, MD;  Location: [facility];  Service: Endoscopy;  Laterality: N/A;  . EXTERNAL FIXATION ANKLE FRACTURE     Fx.  left ankle-fixation with pins later removed sec to infection 1985  . EXTERNAL FIXATION WRIST FRACTURE  1985   left with pins  . EYE SURGERY    . FEMUR FRACTURE SURGERY  06/2009  . FRACTURE SURGERY    . HARDWARE REMOVAL Left 10/05/2013   Procedure: HARDWARE REMOVAL LEFT DISTAL FEMUR;  Surgeon: Rozanna Box, MD;  Location: [place];  Service: Orthopedics;  Laterality: Left;  . HEMIARTHROPLASTY SHOULDER FRACTURE  06/2009  . JOINT REPLACEMENT    . STERIOD INJECTION Right 10/05/2013   Procedure: STEROID INJECTION;  Surgeon: Rozanna Box, MD;  Location: [place];  Service: Orthopedics;  Laterality: Right;  . TONSILLECTOMY    . TONSILLECTOMY    . TOTAL KNEE ARTHROPLASTY  03/09   left    Family History  Problem Relation Age of Onset  . Depression Mother   . Cancer Mother        uterine cancer  .  Heart attack Father   . Cancer Brother        prostate cancer  . Diabetes Maternal Aunt   . Arthritis Brother   . Asthma Brother   . Stroke Maternal Grandmother   . Pulmonary embolism Daughter     Social History   Socioeconomic History  . Marital status: Widowed    Spouse name: Not on file  . Number of children: 4  . Years of education: Not on file  . Highest education level: Not on file  Occupational History  . Occupation: retired crossing guard  . 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 Financial resource strain: Not  on file  . Food insecurity:    Worry: Not on file    Inability: Not on file  . Transportation needs:    Medical: Not on file    Non-medical: Not on file  Tobacco Use  . Smoking status: Former Smoker    Packs/day: 1.00    Years: 40.00    Pack years: 40.00    Types: Cigarettes    Last attempt to quit: 07/06/1993    Years since quitting: 24.4  . Smokeless tobacco: Never Used  Substance and Sexual Activity  . Alcohol use: No    Alcohol/week: 0.0 oz  . Drug use: No  . Sexual activity: Not on file  Lifestyle  . Physical activity:    Days per week: Not on file    Minutes per session: Not on file  . Stress: Not on file  Relationships  . Social connections:    Talks on phone: Not on file    Gets together: Not on file    Attends religious service: Not on file    Active member of club or organization: Not on file    Attends meetings of clubs or organizations: Not on file    Relationship status: Not on file  . Intimate partner violence:    Fear of current or ex partner: Not on file    Emotionally abused: Not on file    Physically abused: Not on file    Forced sexual activity: Not on file  Other Topics Concern  . Not on file  Social History Narrative   No living will   Plans to do health care POA forms---wants daughter [name]   Would accept resuscitation attempts---no prolonged ventilation   Absolutely no feeding tube   Review  of Systems Gets pins and needles in thighs now--can be all day long. Appetite is fine Weight up slightly since leaving rehab--but down from my last visit    Objective:   Physical Exam  Constitutional: No distress.  Neck: No thyromegaly present.  Cardiovascular: Normal rate, regular rhythm and normal heart sounds. Exam reveals no gallop.  No murmur heard. Respiratory: Effort normal and breath sounds normal. No respiratory distress. She has no wheezes. She has no rales.  GI: Soft. There is no tenderness.  Musculoskeletal:  2+ tense right calf swelling, 1+ on left. No tenderness  Lymphadenopathy:    She has no cervical adenopathy.  Psychiatric: She has a normal mood and affect. Her behavior is normal.           Assessment & Plan:

## 2017-12-29 NOTE — Assessment & Plan Note (Signed)
High risk  Continue the eliquis indefinitely

## 2017-12-29 NOTE — Assessment & Plan Note (Signed)
Chronic Can't get on support hose--will look into the zip ups

## 2017-12-29 NOTE — Assessment & Plan Note (Signed)
Compensated Hates the furosemide but has to take it

## 2018-01-20 ENCOUNTER — Encounter: Payer: Medicare Other | Admitting: Internal Medicine

## 2018-02-02 ENCOUNTER — Telehealth: Payer: Self-pay | Admitting: Internal Medicine

## 2018-02-02 NOTE — Telephone Encounter (Signed)
Copied from [place] [phone]. Topic: Quick Communication - See Telephone Encounter >> Feb 02, 2018  5:13 PM Neva Seat wrote: Pt is needing to be on a cancellation list.  Pt is needing and appt w/ Dr. Silvio Pate due to her legs are swelling, some dampness and warm spots on right leg Pt is taking the furosemide (LASIX) 80 MG tablet and the Rx isn't helping.  Dr. Silvio Pate doesn't have any availability until the end of Aug.

## 2018-02-02 NOTE — Telephone Encounter (Signed)
I spoke with pt; pt has had swelling in legs for months, the swelling never goes down. Pt said rt leg is very swollen and left leg has red spots and swollen since out of rehab for couple of months. The lasix is not helping the swelling. Below lt knee feels damp and leg looks shiny but no drainage running down leg. Pt having difficulty breathing. Pt is extremely tired and feels drained. CP on and off. Pt said the swelling and dizziness is worse. Pt does not want to go to ED; pt only wants appt with Dr Silvio Pate, will not schedule with another provider. appt scheduled for 02/03/18 at 4:30 with dr Silvio Pate with instructions if pt condition changes or worsens prior to appt to go to ED. Pt voiced understanding. FYI to Dr Silvio Pate.

## 2018-02-03 ENCOUNTER — Ambulatory Visit (INDEPENDENT_AMBULATORY_CARE_PROVIDER_SITE_OTHER): Payer: Medicare Other | Admitting: Internal Medicine

## 2018-02-03 ENCOUNTER — Encounter: Payer: Self-pay | Admitting: Internal Medicine

## 2018-02-03 VITALS — BP 124/64 | HR 79 | Temp 98.2°F | Ht 62.0 in | Wt 265.0 lb

## 2018-02-03 DIAGNOSIS — I5032 Chronic diastolic (congestive) heart failure: Secondary | ICD-10-CM | POA: Diagnosis not present

## 2018-02-03 DIAGNOSIS — I872 Venous insufficiency (chronic) (peripheral): Secondary | ICD-10-CM

## 2018-02-03 MED ORDER — FUROSEMIDE 80 MG PO TABS
120.0000 mg | ORAL_TABLET | Freq: Every day | ORAL | 3 refills | Status: DC
Start: 2018-02-03 — End: 2018-03-24

## 2018-02-03 NOTE — Assessment & Plan Note (Signed)
Inadequate diuresis---she gets "lazy" and doesn't like the running to the bathroom Appears to have right pleural effusion on exam No distress--will work on outpatient diuresis Consider metolazone if bid furosemide is not working

## 2018-02-03 NOTE — Telephone Encounter (Signed)
I will see her then ?add zaroxlyn if she is really taking the furosemide?

## 2018-02-03 NOTE — Patient Instructions (Signed)
Weigh yourself every day. Increase the furosemide to 120mg  (1 & 1/2 tabs daily). If you don't pee after that, increase to 160mg  at a time. If your weight doesn't come down, let me know and I will add a second medication. You may need a second dose if your weight stays over 260# after a few days. Let me know if the leg redness gets worse (it should improve as you get the fluid out of your legs)

## 2018-02-03 NOTE — Progress Notes (Signed)
   Subjective:    Patient ID: Chelsea Keller, female    DOB: [birth year], 83 y.o.   MRN: [id number]  HPI Here with daughter--- concerned about redness and increased swelling in legs Right calf has been weeping Left leg is warm---worried about cellulitis  Has noticed increased DOE Gets dizzy, fleeting chest pain, SOB just walking a few feet  Taking the furosemide sporadically  Skipped last 2 days due to trouble moving around Has not been weighing herself regularly   Review of Systems Appetite is fine Weight is up 9# since my last visit Wears pads at night for urine incontinence    Objective:   Physical Exam  Constitutional: No distress.  Neck: No thyromegaly present.  Cardiovascular: Normal rate, regular rhythm and normal heart sounds. Exam reveals no gallop.  No murmur heard. Respiratory: She has no wheezes. She has no rales.  Dullness at right base Clear otherwise  Musculoskeletal:  2+ tense edema in both calves tender  Lymphadenopathy:    She has no cervical adenopathy.  Skin:  Stasis changes in both calves--no apparent infection           Assessment & Plan:

## 2018-02-03 NOTE — Assessment & Plan Note (Signed)
Painful calves and stasis changes If any worsening of redness, etc---would start empiric antibiotic

## 2018-02-10 ENCOUNTER — Telehealth: Payer: Self-pay

## 2018-02-10 ENCOUNTER — Emergency Department (HOSPITAL_COMMUNITY)
Admission: EM | Admit: 2018-02-10 | Discharge: 2018-02-10 | Disposition: A | Discharge status: Home / Self Care | Payer: Medicare Other | Attending: Emergency Medicine | Admitting: Emergency Medicine

## 2018-02-10 ENCOUNTER — Encounter (HOSPITAL_COMMUNITY): Payer: Self-pay | Admitting: *Deleted

## 2018-02-10 DIAGNOSIS — M545 Low back pain, unspecified: Secondary | ICD-10-CM

## 2018-02-10 DIAGNOSIS — Z87891 Personal history of nicotine dependence: Secondary | ICD-10-CM | POA: Diagnosis not present

## 2018-02-10 DIAGNOSIS — I5032 Chronic diastolic (congestive) heart failure: Secondary | ICD-10-CM | POA: Insufficient documentation

## 2018-02-10 DIAGNOSIS — M25552 Pain in left hip: Secondary | ICD-10-CM

## 2018-02-10 DIAGNOSIS — N183 Chronic kidney disease, stage 3 (moderate): Secondary | ICD-10-CM | POA: Diagnosis not present

## 2018-02-10 DIAGNOSIS — G8929 Other chronic pain: Secondary | ICD-10-CM | POA: Insufficient documentation

## 2018-02-10 DIAGNOSIS — I130 Hypertensive heart and chronic kidney disease with heart failure and stage 1 through stage 4 chronic kidney disease, or unspecified chronic kidney disease: Secondary | ICD-10-CM | POA: Diagnosis not present

## 2018-02-10 DIAGNOSIS — Z79899 Other long term (current) drug therapy: Secondary | ICD-10-CM | POA: Insufficient documentation

## 2018-02-10 DIAGNOSIS — Z7901 Long term (current) use of anticoagulants: Secondary | ICD-10-CM | POA: Insufficient documentation

## 2018-02-10 MED ORDER — KETOROLAC TROMETHAMINE 30 MG/ML IJ SOLN
60.0000 mg | Freq: Once | INTRAMUSCULAR | Status: AC
  Administered 2018-02-10: 60 mg via INTRAMUSCULAR
  Filled 2018-02-10: qty 2

## 2018-02-10 NOTE — ED Triage Notes (Signed)
Pt in c/o left hip pain, has history of same and treated for sciatica, states she was given a shot in the past but is unsure what it was and it helped

## 2018-02-10 NOTE — ED Notes (Signed)
Patient able to ambulate independently  

## 2018-02-10 NOTE — Telephone Encounter (Signed)
Thank you :)

## 2018-02-10 NOTE — ED Provider Notes (Signed)
[facility] EMERGENCY DEPARTMENT Provider Note  CSN: [id number] Arrival date & time: 02/10/18  0258    History   Chief Complaint Chief Complaint  Patient presents with  . Hip Pain    HPI Chelsea Keller is a 83 y.o. female with a medical history of osteoarthritis, DDD and fibromyalgia who presented to the ED for chronic left hip pain. She describes pain as aching and stinging that begins in her low back and goes down the back of her left leg. Denies recent traumas, falls or injuries that could have exacerbated the pain. Denies fever, paresthesias, foot drop, urinary/bowel dysfunction or weakness. Patient is established with orthopedic provider, but came to the ED in hopes of receiving a hip injection.  Additional history obtained by medical chart. In 11/2017, patient received steroid injection under fluoroscopy during an admission for abdominal complaints.  Past Medical History:  Diagnosis Date  . Allergy   . Anemia   . Anxiety   . Arthritis   . Arthritis of sacroiliac joint of both sides 11/12/2017  . Bilateral pulmonary embolism ([place]) 06/23/2017  . Chronic diastolic CHF (congestive heart failure) (HCC)    a. Echo 1/16:  mild LVH, EF normal, grade 1 DD, MAC  . Chronic venous insufficiency    chronic LE edema  . DDD (degenerative disc disease), lumbar 11/12/2017  . Degenerative joint disease (DJD) of hip, Bilateral  11/12/2017  . Depression   . Fibromyalgia    constant pain  . Hx of cardiac catheterization    a. LHC in [place] "ok" per patient with mild plaque in a single vessel - records not available  . Hx of cardiovascular stress test    a. Nuclear study in 2008 normal  . Hx of colonic polyps   . Hypertension   . Hypertriglyceridemia   . Impaired fasting glucose   . PONV (postoperative nausea and vomiting)   . PUD (peptic ulcer disease)    hx of gastric ulcer  . Pulmonary emboli ([place]) 06/2017  . Vitamin B12 deficiency     Patient Active Problem List   Diagnosis Date Noted  . Degenerative joint disease (DJD) of hip, Bilateral  11/12/2017  . DDD (degenerative disc disease), lumbar 11/12/2017  . Arthritis of sacroiliac joint of both sides 11/12/2017  . History of pulmonary embolism 11/07/2017  . Chronic anticoagulation 11/07/2017  . Diverticulitis of small intestine with perforation 11/07/2017  . Small bowel obstruction ([place]) 11/06/2017  . Neurodermatitis 07/28/2017  . Urinary incontinence 05/26/2017  . Intractable pain-left hip 04/03/2017  . Fibromyalgia 04/03/2017  . Anemia 04/03/2017  . Chronic diastolic CHF (congestive heart failure) ([place]) 04/03/2017  . Hypertension 04/03/2017  . Hypertriglyceridemia 04/03/2017  . Vitamin B12 deficiency 04/03/2017  . CKD (chronic kidney disease) stage 3, GFR 30-59 ml/min (HCC) 04/03/2017  . Acute hypokalemia 04/03/2017  . Left sided sciatica   . Advance directive discussed with patient 01/19/2017  . Chronic diastolic congestive heart failure ([place]) 05/09/2014  . Mood disorder ([place]) 11/03/2013  . Knee joint pain 10/31/2013  . Psoriasis 02/21/2013  . Routine general medical examination at a health care facility 06/14/2012  . BMI 50.0-59.9, adult ([place]) 12/02/2011  . VENTRICULAR HYPERTROPHY, LEFT 10/19/2008  . Sleep disturbance 10/25/2007  . Venous (peripheral) insufficiency 08/11/2007  . Osteoarthritis, multiple sites 08/11/2007  . Impaired fasting glucose 08/11/2007  . COLONIC POLYPS, HX OF 05/20/2007  . B12 DEFICIENCY 09/30/2006  . Essential hypertension 09/30/2006  . Allergic rhinitis due to pollen 09/30/2006  Past Surgical History:  Procedure Laterality Date  . ABDOMINAL HYSTERECTOMY    . CATARACT EXTRACTION  10/2003   OD  . CHOLECYSTECTOMY    . COLONOSCOPY W/ POLYPECTOMY    . COLONOSCOPY WITH PROPOFOL N/A 10/15/2014   Procedure: COLONOSCOPY WITH PROPOFOL;  Surgeon: Ladene Artist, MD;  Location: [facility];  Service: Endoscopy;  Laterality: N/A;  . ESOPHAGOGASTRODUODENOSCOPY  (EGD) WITH PROPOFOL N/A 10/15/2014   Procedure: ESOPHAGOGASTRODUODENOSCOPY (EGD) WITH PROPOFOL;  Surgeon: Ladene Artist, MD;  Location: [facility];  Service: Endoscopy;  Laterality: N/A;  . EXTERNAL FIXATION ANKLE FRACTURE     Fx.  left ankle-fixation with pins later removed sec to infection 1985  . EXTERNAL FIXATION WRIST FRACTURE  1985   left with pins  . EYE SURGERY    . FEMUR FRACTURE SURGERY  06/2009  . FRACTURE SURGERY    . HARDWARE REMOVAL Left 10/05/2013   Procedure: HARDWARE REMOVAL LEFT DISTAL FEMUR;  Surgeon: Rozanna Box, MD;  Location: [facility];  Service: Orthopedics;  Laterality: Left;  . HEMIARTHROPLASTY SHOULDER FRACTURE  06/2009  . JOINT REPLACEMENT    . STERIOD INJECTION Right 10/05/2013   Procedure: STEROID INJECTION;  Surgeon: Rozanna Box, MD;  Location: [facility];  Service: Orthopedics;  Laterality: Right;  . TONSILLECTOMY    . TONSILLECTOMY    . TOTAL KNEE ARTHROPLASTY  03/09   left     OB History   None      Home Medications    Prior to Admission medications   Medication Sig Start Date End Date Taking? Authorizing Provider  acetaminophen (TYLENOL) 500 MG tablet Take 500 mg by mouth every 6 (six) hours as needed for headache (pain).    [provider]  apixaban (ELIQUIS) 5 MG TABS tablet Take 1 tablet (5 mg total) by mouth 2 (two) times daily. 09/14/17   Venia Carbon, MD  furosemide (LASIX) 80 MG tablet Take 1.5-2 tablets (120-160 mg total) by mouth daily. 02/03/18   Venia Carbon, MD  gabapentin (NEURONTIN) 300 MG capsule Take 1 capsule (300 mg total) by mouth 3 (three) times daily. Patient taking differently: Take 300 mg by mouth See admin instructions. Tapering up to 300 mg three times daily: 5/4, 5/5, 5/6 - take one capsule (300 mg) every morning and two 100 mg capsules at bedtime; 5/7, 5/8, 5/9 take one capsule (300 mg) twice daily, 5/10, 5/11, 5/12, take one capsule (300 mg) every morning and one capsule (300 mg) with a 100 mg capsule at  bedtime, 5/13, 5/14, 5/15 - take 800 mg during the day (unknown directions), 5/16 and going forward take one capsule (300 mg) three times daily 10/25/17   Venia Carbon, MD  potassium chloride SA (K-DUR,KLOR-CON) 20 MEQ tablet TAKE 2 TABLETS (40 MEQ TOTAL) DAILY 06/10/17   Venia Carbon, MD  triamterene-hydrochlorothiazide (MAXZIDE-25) 37.5-25 MG tablet Take 1 tablet by mouth daily.    [provider]    Family History Family History  Problem Relation Age of Onset  . Depression Mother   . Cancer Mother        uterine cancer  . Heart attack Father   . Cancer Brother        prostate cancer  . Diabetes Maternal Aunt   . Arthritis Brother   . Asthma Brother   . Stroke Maternal Grandmother   . Pulmonary embolism Daughter     Social History Social History   Tobacco Use  . Smoking status: Former  Smoker    Packs/day: 1.00    Years: 40.00    Pack years: 40.00    Types: Cigarettes    Last attempt to quit: 07/06/1993    Years since quitting: 24.6  . Smokeless tobacco: Never Used  Substance Use Topics  . Alcohol use: No    Alcohol/week: 0.0 standard drinks  . Drug use: No     Allergies   Codeine sulfate; Celecoxib; and Erythromycin base   Review of Systems Review of Systems  Constitutional: Negative for chills and fever.  Musculoskeletal: Positive for arthralgias and back pain. Negative for joint swelling, neck pain and neck stiffness.  Skin: Negative.   Neurological: Negative for tremors, weakness and numbness.  Hematological: Negative.      Physical Exam Updated Vital Signs BP (!) 119/50 (BP Location: Right Arm)   Pulse 79   Temp 98 F (36.7 C) (Oral)   Resp 20   SpO2 100%   Physical Exam  Constitutional: Vital signs are normal. She is cooperative.  Obese.  Musculoskeletal:       Right hip: Normal.       Left hip: She exhibits decreased range of motion and tenderness. She exhibits normal strength, no bony tenderness and no deformity.       Right  knee: Normal.       Left knee: Normal.       Cervical back: She exhibits no tenderness and no bony tenderness.       Thoracic back: She exhibits no tenderness and no bony tenderness.       Lumbar back: She exhibits decreased range of motion, tenderness and spasm. She exhibits no bony tenderness.       Left upper leg: Normal.  Able to bear full weight and ambulate. Left lumbar region has muscular tenderness and spasm. Posterior left leg muscular tenderness. Normal strength and muscle tone intact in lower extremities bilaterally. No muscular atrophy.  Neurological: She is alert. She has normal strength. She displays no atrophy. No sensory deficit. She exhibits normal muscle tone.  Reflex Scores:      Patellar reflexes are 1+ on the right side and 1+ on the left side.      Achilles reflexes are 1+ on the right side and 1+ on the left side. Skin: Skin is warm and intact. Capillary refill takes 2 to 3 seconds. No bruising and no ecchymosis noted.  Nursing note and vitals reviewed.   ED Treatments / Results  Labs (all labs ordered are listed, but only abnormal results are displayed) Labs Reviewed - No data to display  EKG None  Radiology No results found.  Procedures Procedures (including critical care time)  Medications Ordered in ED Medications  ketorolac (TORADOL) 30 MG/ML injection 60 mg (has no administration in time range)     Initial Impression / Assessment and Plan / ED Course  Triage vital signs and the nursing notes have been reviewed.  Pertinent labs & imaging results that were available during care of the patient were reviewed and considered in medical decision making (see chart for details).   Patient is in no distress and well appearing. Patient has full sensation in lower extremities bilaterally. She also has full active and passive ROM. No deformities, decreased muscle tone or other abnormalities visualized. Neurovascular function is intact. Physical exam is  significant for left lumbar and gluteal muscle tenderness. There have been no new injuries, traumas or falls that would warrant the need for new imaging today. There are no  other physical exam findings or s/s that suggest an underlying infectious or rheumatologic process that warrants further evaluation or intervention today. Patient's complaint is chronic and she has established care with an orthopedist. She states that her plan is to call the orthopedic doctor, Dr. Marcelino Scot, to schedule a follow-up appointment and arrange another injection.  Final Clinical Impressions(s) / ED Diagnoses  1. Low Back Pain. Associated hip pain. IM Toradol administered in the ED for acute relief. Education provided on OTC and supportive treatment for pain relief and inflammation. Advised to follow-up with orthopedic doctor, Dr. Marcelino Scot, to discuss other interventions.  Dispo: Home. After thorough clinical evaluation, this patient is determined to be medically stable and can be safely discharged with the previously mentioned treatment and/or outpatient follow-up/referral(s). At this time, there are no other apparent medical conditions that require further screening, evaluation or treatment.   Final diagnoses:  Left hip pain  Chronic left-sided low back pain without sciatica    ED Discharge Orders    None        Junita Push 02/10/18 1048    Virgel Manifold, MD 02/10/18 [phone]

## 2018-02-10 NOTE — Discharge Instructions (Addendum)
Today you received an injection of Toradol which is an anti-inflammatory similar to ibuprofen. We are unable to do steroid injections in the hip out of the emergency room. Please call Dr. Marcelino Scot to schedule an appointment soon to discuss other treatment options.  Heat on your low back may provide some relief as well.

## 2018-02-10 NOTE — Telephone Encounter (Signed)
° ° °  Pt daughter [name] call ed back and said Mom was in ER this morning and that she has an appt with Dr Altamese [place]

## 2018-02-10 NOTE — Telephone Encounter (Signed)
Tried to call pt to see how she was doing after recent ER visit for hip pain and to make sure she set up an appt with Ortho. She does not have her VM set up so I could not leave a message

## 2018-02-11 MED ORDER — TRAMADOL HCL 50 MG PO TABS: 50.0000 mg | ORAL_TABLET | Freq: Three times a day (TID) | ORAL | 0 refills | Status: DC | PRN

## 2018-02-13 ENCOUNTER — Emergency Department (HOSPITAL_COMMUNITY)
Admission: EM | Admit: 2018-02-13 | Discharge: 2018-02-13 | Disposition: A | Discharge status: Home / Self Care | Payer: Medicare Other | Attending: Emergency Medicine | Admitting: Emergency Medicine

## 2018-02-13 ENCOUNTER — Encounter (HOSPITAL_COMMUNITY): Payer: Self-pay | Admitting: *Deleted

## 2018-02-13 ENCOUNTER — Emergency Department (HOSPITAL_COMMUNITY): Payer: Medicare Other

## 2018-02-13 ENCOUNTER — Other Ambulatory Visit: Payer: Self-pay

## 2018-02-13 DIAGNOSIS — Z79899 Other long term (current) drug therapy: Secondary | ICD-10-CM | POA: Insufficient documentation

## 2018-02-13 DIAGNOSIS — G8929 Other chronic pain: Secondary | ICD-10-CM | POA: Diagnosis not present

## 2018-02-13 DIAGNOSIS — I5032 Chronic diastolic (congestive) heart failure: Secondary | ICD-10-CM | POA: Insufficient documentation

## 2018-02-13 DIAGNOSIS — Z7901 Long term (current) use of anticoagulants: Secondary | ICD-10-CM | POA: Insufficient documentation

## 2018-02-13 DIAGNOSIS — I959 Hypotension, unspecified: Secondary | ICD-10-CM | POA: Diagnosis not present

## 2018-02-13 DIAGNOSIS — Z87891 Personal history of nicotine dependence: Secondary | ICD-10-CM | POA: Diagnosis not present

## 2018-02-13 DIAGNOSIS — I130 Hypertensive heart and chronic kidney disease with heart failure and stage 1 through stage 4 chronic kidney disease, or unspecified chronic kidney disease: Secondary | ICD-10-CM | POA: Diagnosis not present

## 2018-02-13 DIAGNOSIS — M25552 Pain in left hip: Secondary | ICD-10-CM | POA: Insufficient documentation

## 2018-02-13 DIAGNOSIS — Z7401 Bed confinement status: Secondary | ICD-10-CM | POA: Diagnosis not present

## 2018-02-13 DIAGNOSIS — N183 Chronic kidney disease, stage 3 (moderate): Secondary | ICD-10-CM | POA: Diagnosis not present

## 2018-02-13 DIAGNOSIS — M2550 Pain in unspecified joint: Secondary | ICD-10-CM | POA: Diagnosis not present

## 2018-02-13 MED ORDER — LIDOCAINE 5 % EX PTCH: 1.0000 | MEDICATED_PATCH | CUTANEOUS | 0 refills | Status: DC

## 2018-02-13 MED ORDER — TRAMADOL HCL 50 MG PO TABS
50.0000 mg | ORAL_TABLET | ORAL | Status: AC
  Administered 2018-02-13: 50 mg via ORAL
  Filled 2018-02-13: qty 1

## 2018-02-13 MED ORDER — KETOROLAC TROMETHAMINE 60 MG/2ML IM SOLN
30.0000 mg | Freq: Once | INTRAMUSCULAR | Status: AC
  Administered 2018-02-13: 30 mg via INTRAMUSCULAR
  Filled 2018-02-13: qty 2

## 2018-02-13 MED ORDER — LIDOCAINE 5 % EX PTCH
1.0000 | MEDICATED_PATCH | CUTANEOUS | Status: DC
  Administered 2018-02-13: 1 via TRANSDERMAL
  Filled 2018-02-13: qty 1

## 2018-02-13 MED ORDER — PREDNISONE 20 MG PO TABS: ORAL_TABLET | ORAL | 0 refills | Status: DC

## 2018-02-13 MED ORDER — DEXAMETHASONE SODIUM PHOSPHATE 10 MG/ML IJ SOLN
10.0000 mg | Freq: Once | INTRAMUSCULAR | Status: AC
  Administered 2018-02-13: 10 mg via INTRAMUSCULAR
  Filled 2018-02-13: qty 1

## 2018-02-13 MED ORDER — DICLOFENAC SODIUM 1 % TD GEL: 4.0000 g | Freq: Four times a day (QID) | TRANSDERMAL | 0 refills | Status: DC

## 2018-02-13 MED ORDER — KETOROLAC TROMETHAMINE 60 MG/2ML IM SOLN: 60.0000 mg | Freq: Once | INTRAMUSCULAR | Status: DC

## 2018-02-13 NOTE — ED Provider Notes (Addendum)
[facility] EMERGENCY DEPARTMENT Provider Note   CSN: [id number] Arrival date & time: 02/13/18  0137     History   Chief Complaint Chief Complaint  Patient presents with  . Hip Pain    HPI Chelsea Keller is a 83 y.o. female.  The history is provided by the patient.  Hip Pain  This is a chronic problem. The current episode started more than 1 week ago (years ). The problem occurs constantly. The problem has not changed since onset.Pertinent negatives include no chest pain, no abdominal pain, no headaches and no shortness of breath. Nothing aggravates the symptoms. Nothing relieves the symptoms. Treatments tried: tramadol. The treatment provided no relief.  Family is stating that patient's pain is uncontrolled and patient must be admitted for control.  Stating they discussed this with Dr. Mardelle Matte via phone though the patient's orthopod is Dr. Marcelino Scot.    Past Medical History:  Diagnosis Date  . Allergy   . Anemia   . Anxiety   . Arthritis   . Arthritis of sacroiliac joint of both sides 11/12/2017  . Bilateral pulmonary embolism ([place]) 06/23/2017  . Chronic diastolic CHF (congestive heart failure) (HCC)    a. Echo 1/16:  mild LVH, EF normal, grade 1 DD, MAC  . Chronic venous insufficiency    chronic LE edema  . DDD (degenerative disc disease), lumbar 11/12/2017  . Degenerative joint disease (DJD) of hip, Bilateral  11/12/2017  . Depression   . Fibromyalgia    constant pain  . Hx of cardiac catheterization    a. LHC in [place] "ok" per patient with mild plaque in a single vessel - records not available  . Hx of cardiovascular stress test    a. Nuclear study in 2008 normal  . Hx of colonic polyps   . Hypertension   . Hypertriglyceridemia   . Impaired fasting glucose   . PONV (postoperative nausea and vomiting)   . PUD (peptic ulcer disease)    hx of gastric ulcer  . Pulmonary emboli ([place]) 06/2017  . Vitamin B12 deficiency     Patient Active Problem List   Diagnosis Date Noted  . Degenerative joint disease (DJD) of hip, Bilateral  11/12/2017  . DDD (degenerative disc disease), lumbar 11/12/2017  . Arthritis of sacroiliac joint of both sides 11/12/2017  . History of pulmonary embolism 11/07/2017  . Chronic anticoagulation 11/07/2017  . Diverticulitis of small intestine with perforation 11/07/2017  . Small bowel obstruction ([place]) 11/06/2017  . Neurodermatitis 07/28/2017  . Urinary incontinence 05/26/2017  . Intractable pain-left hip 04/03/2017  . Fibromyalgia 04/03/2017  . Anemia 04/03/2017  . Chronic diastolic CHF (congestive heart failure) ([place]) 04/03/2017  . Hypertension 04/03/2017  . Hypertriglyceridemia 04/03/2017  . Vitamin B12 deficiency 04/03/2017  . CKD (chronic kidney disease) stage 3, GFR 30-59 ml/min (HCC) 04/03/2017  . Acute hypokalemia 04/03/2017  . Left sided sciatica   . Advance directive discussed with patient 01/19/2017  . Chronic diastolic congestive heart failure ([place]) 05/09/2014  . Mood disorder ([place]) 11/03/2013  . Knee joint pain 10/31/2013  . Psoriasis 02/21/2013  . Routine general medical examination at a health care facility 06/14/2012  . BMI 50.0-59.9, adult ([place]) 12/02/2011  . VENTRICULAR HYPERTROPHY, LEFT 10/19/2008  . Sleep disturbance 10/25/2007  . Venous (peripheral) insufficiency 08/11/2007  . Osteoarthritis, multiple sites 08/11/2007  . Impaired fasting glucose 08/11/2007  . COLONIC POLYPS, HX OF 05/20/2007  . B12 DEFICIENCY 09/30/2006  . Essential hypertension 09/30/2006  . Allergic rhinitis due  to pollen 09/30/2006    Past Surgical History:  Procedure Laterality Date  . ABDOMINAL HYSTERECTOMY    . CATARACT EXTRACTION  10/2003   OD  . CHOLECYSTECTOMY    . COLONOSCOPY W/ POLYPECTOMY    . COLONOSCOPY WITH PROPOFOL N/A 10/15/2014   Procedure: COLONOSCOPY WITH PROPOFOL;  Surgeon: Ladene Artist, MD;  [facility];  Service: Endoscopy;  Laterality: N/A;  . ESOPHAGOGASTRODUODENOSCOPY  (EGD) WITH PROPOFOL N/A 10/15/2014   Procedure: ESOPHAGOGASTRODUODENOSCOPY (EGD) WITH PROPOFOL;  Surgeon: Ladene Artist, MD;  [facility];  Service: Endoscopy;  Laterality: N/A;  . EXTERNAL FIXATION ANKLE FRACTURE     Fx.  left ankle-fixation with pins later removed sec to infection 1985  . EXTERNAL FIXATION WRIST FRACTURE  1985   left with pins  . EYE SURGERY    . FEMUR FRACTURE SURGERY  06/2009  . FRACTURE SURGERY    . HARDWARE REMOVAL Left 10/05/2013   Procedure: HARDWARE REMOVAL LEFT DISTAL FEMUR;  Surgeon: Rozanna Box, MD;  Location: [facility];  Service: Orthopedics;  Laterality: Left;  . HEMIARTHROPLASTY SHOULDER FRACTURE  06/2009  . JOINT REPLACEMENT    . STERIOD INJECTION Right 10/05/2013   Procedure: STEROID INJECTION;  Surgeon: Rozanna Box, MD;  Location: [facility];  Service: Orthopedics;  Laterality: Right;  . TONSILLECTOMY    . TONSILLECTOMY    . TOTAL KNEE ARTHROPLASTY  03/09   left     OB History   None      Home Medications    Prior to Admission medications   Medication Sig Start Date End Date Taking? Authorizing Provider  acetaminophen (TYLENOL) 500 MG tablet Take 500 mg by mouth every 6 (six) hours as needed for headache (pain).    [provider]  apixaban (ELIQUIS) 5 MG TABS tablet Take 1 tablet (5 mg total) by mouth 2 (two) times daily. 09/14/17   Venia Carbon, MD  furosemide (LASIX) 80 MG tablet Take 1.5-2 tablets (120-160 mg total) by mouth daily. 02/03/18   Venia Carbon, MD  gabapentin (NEURONTIN) 300 MG capsule Take 1 capsule (300 mg total) by mouth 3 (three) times daily. Patient taking differently: Take 300 mg by mouth See admin instructions. Tapering up to 300 mg three times daily: 5/4, 5/5, 5/6 - take one capsule (300 mg) every morning and two 100 mg capsules at bedtime; 5/7, 5/8, 5/9 take one capsule (300 mg) twice daily, 5/10, 5/11, 5/12, take one capsule (300 mg) every morning and one capsule (300 mg) with a 100 mg capsule at  bedtime, 5/13, 5/14, 5/15 - take 800 mg during the day (unknown directions), 5/16 and going forward take one capsule (300 mg) three times daily 10/25/17   Venia Carbon, MD  potassium chloride SA (K-DUR,KLOR-CON) 20 MEQ tablet TAKE 2 TABLETS (40 MEQ TOTAL) DAILY 06/10/17   Venia Carbon, MD  traMADol (ULTRAM) 50 MG tablet Take 1-2 tablets (50-100 mg total) by mouth 3 (three) times daily as needed. 02/11/18   Venia Carbon, MD  triamterene-hydrochlorothiazide (MAXZIDE-25) 37.5-25 MG tablet Take 1 tablet by mouth daily.    [provider]    Family History Family History  Problem Relation Age of Onset  . Depression Mother   . Cancer Mother        uterine cancer  . Heart attack Father   . Cancer Brother        prostate cancer  . Diabetes Maternal Aunt   . Arthritis Brother   .  Asthma Brother   . Stroke Maternal Grandmother   . Pulmonary embolism Daughter     Social History Social History   Tobacco Use  . Smoking status: Former Smoker    Packs/day: 1.00    Years: 40.00    Pack years: 40.00    Types: Cigarettes    Last attempt to quit: 07/06/1993    Years since quitting: 24.6  . Smokeless tobacco: Never Used  Substance Use Topics  . Alcohol use: No    Alcohol/week: 0.0 standard drinks  . Drug use: No     Allergies   Codeine sulfate; Celecoxib; and Erythromycin base   Review of Systems Review of Systems  Constitutional: Negative for fever.  Respiratory: Negative for chest tightness and shortness of breath.   Cardiovascular: Negative for chest pain, palpitations and leg swelling.  Gastrointestinal: Negative for abdominal pain.  Musculoskeletal: Positive for arthralgias. Negative for joint swelling.  Neurological: Negative for headaches.  All other systems reviewed and are negative.    Physical Exam Updated Vital Signs BP (!) 135/56   Pulse 82   Temp 98.3 F (36.8 C) (Oral)   Resp 18   SpO2 96%   Physical Exam  Constitutional: She appears  well-developed and well-nourished. No distress.  HENT:  Head: Normocephalic and atraumatic.  Mouth/Throat: No oropharyngeal exudate.  Eyes: Pupils are equal, round, and reactive to light. Conjunctivae are normal.  Neck: Normal range of motion. Neck supple.  Cardiovascular: Normal rate, regular rhythm, normal heart sounds and intact distal pulses.  Pulmonary/Chest: Effort normal and breath sounds normal. No stridor. She has no wheezes. She has no rales.  Abdominal: Soft. Bowel sounds are normal. She exhibits no mass. There is no tenderness. There is no rebound and no guarding.  Musculoskeletal: She exhibits no tenderness or deformity.  5/5 but limited by pain,  3+ dorsalis pedis B Negative Homan's sign  Neurological: She is alert. She displays normal reflexes. No cranial nerve deficit. Coordination normal.  Skin: Skin is warm and dry. Capillary refill takes less than 2 seconds.  Psychiatric: Her affect is angry.     ED Treatments / Results  Labs (all labs ordered are listed, but only abnormal results are displayed) Labs Reviewed - No data to display  EKG None  Radiology Dg Hip Unilat W Or Wo Pelvis 2-3 Views Left  Result Date: 02/13/2018 CLINICAL DATA:  Chronic left hip pain ongoing since December, worsening this week. EXAM: DG HIP (WITH OR WITHOUT PELVIS) 2-3V LEFT COMPARISON:  04/03/2017 FINDINGS: Chronic stable lumbar spondylosis of the included lumbar spine. Sclerosis of both sacroiliac joints compatible with chronic sacroiliitis or osteoarthritis. No pelvic diastasis. No pelvic fracture. Mild joint space narrowing of both hips without acute fracture. Catheter tubing projects over the lower pelvis. IMPRESSION: 1. Chronic lumbar spondylosis. 2. Bilateral sacroiliac joint sclerosis compatible with chronic sacroiliitis. 3. No acute pelvic nor hip fracture. 4. Osteoarthritis of both hips. Electronically Signed   By: Ashley Royalty M.D.   On: 02/13/2018 03:30    Procedures Procedures  (including critical care time)  Medications Ordered in ED Medications  lidocaine (LIDODERM) 5 % 1 patch (1 patch Transdermal Patch Applied 02/13/18 0253)  traMADol (ULTRAM) tablet 50 mg (has no administration in time range)  ketorolac (TORADOL) injection 30 mg (30 mg Intramuscular Given 02/13/18 0253)  dexamethasone (DECADRON) injection 10 mg (10 mg Intramuscular Given 02/13/18 0401)    405: case d/w Dr. Mardelle Matte who states he was told the patient's family reported that patient was  told she needed to be admitted for an [place].  All results reviewed.  No findings on XR are acute.  There is no admission criteria.  Dr. Mardelle Matte understands the patient will be upset but there are no criteria at this time.    Final Clinical Impressions(s) / ED Diagnoses   Family is angry that patient cannot walk secondary to chronic pain.  Family wants patient admitted.  EDP stated there is no admission cirteria for admission and that I have discussed her case with Dr. Mardelle Matte on call for orthopedics.Family is stating they want to call Dr. Mardelle Matte.  Pain is chronic patient is already on narcotic pain medication at home started by Dr. Silvio Pate. EDP apologized that this is not what the patient wants. Will start lidoderm and steroids.  Patient has a follow up on Wednesday with Dr. Marcelino Scot.  The leg is not weak on exam, this is pain related.  Have written for a care management consult for home services and for a wheelchair.    Return for numbness, changes in vision or speech, fevers >100.4 unrelieved by medication, shortness of breath, intractable vomiting, or diarrhea, abdominal pain, Inability to tolerate liquids or food, cough, altered mental status or any concerns. No signs of systemic illness or infection. The patient is nontoxic-appearing on exam and vital signs are within normal limits. Will refer to urology for microscopy hematuria as patient is asymptomatic.  I have reviewed the triage vital signs and the nursing notes.  Pertinent labs &imaging results that were available during my care of the patient were reviewed by me and considered in my medical decision making (see chart for details).  After history, exam, and medical workup I feel the patient has been appropriately medically screened and is safe for discharge home. Pertinent diagnoses were discussed with the patient. Patient was given return precautions.    Art Levan, MD 02/13/18 [place], Derl Abalos, MD 02/13/18 1660

## 2018-02-13 NOTE — ED Notes (Signed)
Patient transported to X-ray 

## 2018-02-13 NOTE — ED Triage Notes (Signed)
Pt c/o chronic L hip pain, ongoing since December worsening this week. Denies new medications. Has taken tramadol for pain without improvement.

## 2018-02-14 NOTE — Care Management Note (Signed)
Case Management Note  CM consulted for [facility] and for a wheelchair.  No orders were placed.  If pt still needs these orders, Dr. Marcelino Scot can write them when he sees the pt on Wed.  Updated Dr. Randal Buba via messages.  No further CM needs noted at this time.  Atticus Lemberger, Benjaman Lobe, RN 02/14/2018, 1:26 PM

## 2018-02-15 ENCOUNTER — Telehealth: Payer: Self-pay

## 2018-02-15 NOTE — Telephone Encounter (Signed)
Tried to call pt. VM is not set up. 

## 2018-02-15 NOTE — Telephone Encounter (Signed)
Pt calling back please advise Cb#[phone]

## 2018-02-15 NOTE — Telephone Encounter (Signed)
Please check on her  May want to look into a skilled nursing facility (that would be the alternative to home since she doesn't meet criteria for admission----but they would have to pay)

## 2018-02-16 DIAGNOSIS — R627 Adult failure to thrive: Secondary | ICD-10-CM | POA: Diagnosis not present

## 2018-02-16 DIAGNOSIS — M160 Bilateral primary osteoarthritis of hip: Secondary | ICD-10-CM | POA: Diagnosis not present

## 2018-02-16 DIAGNOSIS — M1612 Unilateral primary osteoarthritis, left hip: Secondary | ICD-10-CM | POA: Diagnosis not present

## 2018-02-16 NOTE — Telephone Encounter (Signed)
Tried calling back. VM not set up.

## 2018-02-17 ENCOUNTER — Telehealth: Payer: Self-pay | Admitting: Cardiovascular Disease

## 2018-02-17 ENCOUNTER — Other Ambulatory Visit: Payer: Self-pay | Admitting: Orthopedic Surgery

## 2018-02-17 DIAGNOSIS — M1990 Unspecified osteoarthritis, unspecified site: Secondary | ICD-10-CM

## 2018-02-17 NOTE — Telephone Encounter (Signed)
Note not needed 

## 2018-02-21 ENCOUNTER — Telehealth: Payer: Self-pay | Admitting: *Deleted

## 2018-02-21 ENCOUNTER — Telehealth: Payer: Self-pay | Admitting: Internal Medicine

## 2018-02-21 MED ORDER — MAGIC MOUTHWASH W/LIDOCAINE: 5.0000 mL | Freq: Four times a day (QID) | ORAL | 0 refills | Status: DC | PRN

## 2018-02-21 NOTE — Telephone Encounter (Signed)
Clearance has not been received in office. I placed call to pt's daughter to discuss. Left message to call back

## 2018-02-21 NOTE — Telephone Encounter (Signed)
Pt's dtr [name] calling -she sent a mychart message: Mom had her appointment with Dr. Marcelino Scot. They are going to do an injection in the SI joint. They want her cleared by Dr. Angelena Form since she is on Eliquis. They sent it to his office on Thursday and haven't heard back. I am going to call his office on Monday to see what the hold up is. Mom is complaining about "thrush" in her mouth. she is very uncomfortable. She had tried rinsing with salt water but isn't getting any relief. What else can she do for this? She is also saying she is feeling pain from between the breast and around the left side under the breast to the middle of the back. She is following up with [place] to ask about going off the Eliquis, she states we were sent a fax on Thursday from Dr. Carlean Jews office, and they have not heard back and he is ina lot of pain. Pls advise [phone]

## 2018-02-21 NOTE — Telephone Encounter (Signed)
   [facility] Pre-operative Risk Assessment    Request for surgical clearance:  1. What type of surgery is being performed? Left SI joint injection   2. When is this surgery scheduled?  Appointment will be scheduled following response to this request.   3. What type of clearance is required (medical clearance vs. Pharmacy clearance to hold med vs. Both)? Pharmacy  4. Are there any medications that need to be held prior to surgery and how long? Eliquis for 2 days   5. Practice name and name of physician performing surgery? [facility]. Physician not listed.   6. What is your office phone number [phone]    7.   What is your office fax number [phone]  8.   Anesthesia type (None, local, MAC, general) ? Not noted.     _________________________________________________________________   (provider comments below)

## 2018-02-21 NOTE — Telephone Encounter (Signed)
Copied from [place] [phone]. Topic: Quick Communication - See Telephone Encounter >> Feb 21, 2018  2:43 PM [name] wrote: CRM for notification. See Telephone encounter for: 02/21/18.  Marjory Lies with [facility] needs clarification on " magic mouthwash " that was received today. He needs certain recipe and quantity    Call back [phone]

## 2018-02-21 NOTE — Telephone Encounter (Signed)
I called Dr. Carlean Jews office to see if they could refax clearance.  I spoke with [facility] who reports they have not sent any clearance to our office.

## 2018-02-21 NOTE — Telephone Encounter (Signed)
[facility] to [facility] 

## 2018-02-21 NOTE — Telephone Encounter (Signed)
Lidocaine, benedryl and nystatin usually I am not particular for which one (whichever they have) #275ml if they can do that much

## 2018-02-21 NOTE — Telephone Encounter (Signed)
Spoke to [place] at the pharmacy. He will get the medication filled with what they have.

## 2018-02-21 NOTE — Telephone Encounter (Signed)
Clearance form has been received in office from [facility].  Separate clearance phone note started and routed to pre op pool.

## 2018-02-21 NOTE — Telephone Encounter (Signed)
Please phone in the order that printed

## 2018-02-22 NOTE — Telephone Encounter (Signed)
Routed via Epic two different ways to [employer] phone # [phone] - Fax # [phone]

## 2018-02-22 NOTE — Telephone Encounter (Signed)
Routing to pharmacy.  Rebel Willcutt C. Marlayna Bannister, RN, ANP-C [facility] [address] [place], [place]  [address] [phone]  

## 2018-02-22 NOTE — Telephone Encounter (Signed)
Pt takes Eliquis for bilateral PE in December 2018. Eliquis is being prescribed by Dr Silvio Pate. Renal function is normal. Note 07/28/17 states plan for indefinite Eliquis therapy. Typically hold DOACs for 3 days prior to spinal injections, however due to noncardiac indication and external MD managing/prescribing Eliquis, recommend that clearance request be forwarded to Dr Alla German office for anticoagulation clearance.

## 2018-02-23 NOTE — Telephone Encounter (Signed)
Faxed back to number provided. Clearance provided by managing physician.

## 2018-02-23 NOTE — Telephone Encounter (Signed)
It is okay to hold her eliquis prior to the planned procedure----but in general, holding more than 48 hours is not considered necessary and may increase thrombosis risk

## 2018-02-24 MED ORDER — TRAMADOL HCL 50 MG PO TABS: 50.0000 mg | ORAL_TABLET | Freq: Three times a day (TID) | ORAL | 0 refills | Status: DC | PRN

## 2018-02-24 NOTE — Addendum Note (Signed)
Addended by: Viviana Simpler I on: 02/24/2018 05:13 PM   Modules accepted: Orders

## 2018-03-01 NOTE — Telephone Encounter (Signed)
Please check with pharmacist and see if any antifungal swish and swallow (like plain nystatin) is covered by insurance

## 2018-03-02 MED ORDER — NYSTATIN 100000 UNIT/ML MT SUSP: 5.0000 mL | Freq: Four times a day (QID) | OROMUCOSAL | 1 refills | Status: DC

## 2018-03-02 NOTE — Telephone Encounter (Signed)
Sp[facility] to [place] at [facility]. She said she cannot check without a prescription. She said liquid Nystatin is not too expensive without insurance. Will need a prescription sent in.

## 2018-03-08 ENCOUNTER — Ambulatory Visit
Admission: RE | Admit: 2018-03-08 | Discharge: 2018-03-08 | Disposition: A | Discharge status: Home / Self Care | Payer: Medicare Other | Source: Ambulatory Visit | Attending: Orthopedic Surgery | Admitting: Orthopedic Surgery

## 2018-03-08 DIAGNOSIS — M1990 Unspecified osteoarthritis, unspecified site: Secondary | ICD-10-CM

## 2018-03-08 DIAGNOSIS — M533 Sacrococcygeal disorders, not elsewhere classified: Secondary | ICD-10-CM | POA: Diagnosis not present

## 2018-03-08 MED ORDER — IOPAMIDOL (ISOVUE-M 200) INJECTION 41%
1.0000 mL | Freq: Once | INTRAMUSCULAR | Status: AC
  Administered 2018-03-08: 1 mL via INTRA_ARTICULAR

## 2018-03-08 MED ORDER — METHYLPREDNISOLONE ACETATE 40 MG/ML INJ SUSP (RADIOLOG
120.0000 mg | Freq: Once | INTRAMUSCULAR | Status: AC
  Administered 2018-03-08: 120 mg via INTRA_ARTICULAR

## 2018-03-08 NOTE — Discharge Instructions (Signed)

## 2018-03-21 ENCOUNTER — Other Ambulatory Visit: Payer: Self-pay

## 2018-03-21 ENCOUNTER — Telehealth: Payer: Self-pay

## 2018-03-21 ENCOUNTER — Emergency Department (HOSPITAL_COMMUNITY): Payer: Medicare Other

## 2018-03-21 ENCOUNTER — Inpatient Hospital Stay (HOSPITAL_COMMUNITY)
Admission: EM | Admit: 2018-03-21 | Discharge: 2018-03-24 | DRG: 394 | Disposition: A | Discharge status: Home Health | Payer: Medicare Other | Attending: Family Medicine | Admitting: Family Medicine

## 2018-03-21 DIAGNOSIS — N179 Acute kidney failure, unspecified: Secondary | ICD-10-CM | POA: Diagnosis present

## 2018-03-21 DIAGNOSIS — Q2733 Arteriovenous malformation of digestive system vessel: Secondary | ICD-10-CM | POA: Diagnosis not present

## 2018-03-21 DIAGNOSIS — K625 Hemorrhage of anus and rectum: Secondary | ICD-10-CM | POA: Diagnosis not present

## 2018-03-21 DIAGNOSIS — Z96652 Presence of left artificial knee joint: Secondary | ICD-10-CM | POA: Diagnosis present

## 2018-03-21 DIAGNOSIS — K5730 Diverticulosis of large intestine without perforation or abscess without bleeding: Secondary | ICD-10-CM | POA: Diagnosis not present

## 2018-03-21 DIAGNOSIS — R110 Nausea: Secondary | ICD-10-CM | POA: Diagnosis not present

## 2018-03-21 DIAGNOSIS — L409 Psoriasis, unspecified: Secondary | ICD-10-CM | POA: Diagnosis present

## 2018-03-21 DIAGNOSIS — K648 Other hemorrhoids: Secondary | ICD-10-CM | POA: Diagnosis present

## 2018-03-21 DIAGNOSIS — I5032 Chronic diastolic (congestive) heart failure: Secondary | ICD-10-CM | POA: Diagnosis not present

## 2018-03-21 DIAGNOSIS — Z6841 Body Mass Index (BMI) 40.0 and over, adult: Secondary | ICD-10-CM

## 2018-03-21 DIAGNOSIS — D692 Other nonthrombocytopenic purpura: Secondary | ICD-10-CM | POA: Diagnosis present

## 2018-03-21 DIAGNOSIS — Z86711 Personal history of pulmonary embolism: Secondary | ICD-10-CM | POA: Diagnosis not present

## 2018-03-21 DIAGNOSIS — N183 Chronic kidney disease, stage 3 (moderate): Secondary | ICD-10-CM | POA: Diagnosis present

## 2018-03-21 DIAGNOSIS — R195 Other fecal abnormalities: Secondary | ICD-10-CM | POA: Diagnosis not present

## 2018-03-21 DIAGNOSIS — R079 Chest pain, unspecified: Secondary | ICD-10-CM | POA: Diagnosis not present

## 2018-03-21 DIAGNOSIS — Z86010 Personal history of colonic polyps: Secondary | ICD-10-CM

## 2018-03-21 DIAGNOSIS — M797 Fibromyalgia: Secondary | ICD-10-CM | POA: Diagnosis not present

## 2018-03-21 DIAGNOSIS — Z7901 Long term (current) use of anticoagulants: Secondary | ICD-10-CM

## 2018-03-21 DIAGNOSIS — Z860101 Personal history of adenomatous and serrated colon polyps: Secondary | ICD-10-CM

## 2018-03-21 DIAGNOSIS — Z881 Allergy status to other antibiotic agents status: Secondary | ICD-10-CM

## 2018-03-21 DIAGNOSIS — D649 Anemia, unspecified: Secondary | ICD-10-CM | POA: Diagnosis not present

## 2018-03-21 DIAGNOSIS — Z87891 Personal history of nicotine dependence: Secondary | ICD-10-CM

## 2018-03-21 DIAGNOSIS — I130 Hypertensive heart and chronic kidney disease with heart failure and stage 1 through stage 4 chronic kidney disease, or unspecified chronic kidney disease: Secondary | ICD-10-CM | POA: Diagnosis present

## 2018-03-21 DIAGNOSIS — R9431 Abnormal electrocardiogram [ECG] [EKG]: Secondary | ICD-10-CM | POA: Diagnosis not present

## 2018-03-21 DIAGNOSIS — R748 Abnormal levels of other serum enzymes: Secondary | ICD-10-CM | POA: Diagnosis not present

## 2018-03-21 DIAGNOSIS — D500 Iron deficiency anemia secondary to blood loss (chronic): Secondary | ICD-10-CM | POA: Diagnosis present

## 2018-03-21 DIAGNOSIS — M1990 Unspecified osteoarthritis, unspecified site: Secondary | ICD-10-CM | POA: Diagnosis present

## 2018-03-21 DIAGNOSIS — I10 Essential (primary) hypertension: Secondary | ICD-10-CM | POA: Diagnosis present

## 2018-03-21 DIAGNOSIS — E876 Hypokalemia: Secondary | ICD-10-CM

## 2018-03-21 DIAGNOSIS — E538 Deficiency of other specified B group vitamins: Secondary | ICD-10-CM | POA: Diagnosis not present

## 2018-03-21 DIAGNOSIS — I872 Venous insufficiency (chronic) (peripheral): Secondary | ICD-10-CM | POA: Diagnosis not present

## 2018-03-21 DIAGNOSIS — M5136 Other intervertebral disc degeneration, lumbar region: Secondary | ICD-10-CM | POA: Diagnosis not present

## 2018-03-21 DIAGNOSIS — K649 Unspecified hemorrhoids: Secondary | ICD-10-CM | POA: Diagnosis not present

## 2018-03-21 DIAGNOSIS — Z79899 Other long term (current) drug therapy: Secondary | ICD-10-CM | POA: Diagnosis not present

## 2018-03-21 DIAGNOSIS — R609 Edema, unspecified: Secondary | ICD-10-CM

## 2018-03-21 DIAGNOSIS — F419 Anxiety disorder, unspecified: Secondary | ICD-10-CM | POA: Diagnosis present

## 2018-03-21 DIAGNOSIS — Z885 Allergy status to narcotic agent status: Secondary | ICD-10-CM

## 2018-03-21 DIAGNOSIS — Z888 Allergy status to other drugs, medicaments and biological substances status: Secondary | ICD-10-CM

## 2018-03-21 DIAGNOSIS — K5520 Angiodysplasia of colon without hemorrhage: Secondary | ICD-10-CM | POA: Diagnosis not present

## 2018-03-21 DIAGNOSIS — K922 Gastrointestinal hemorrhage, unspecified: Secondary | ICD-10-CM | POA: Diagnosis not present

## 2018-03-21 DIAGNOSIS — R600 Localized edema: Secondary | ICD-10-CM | POA: Diagnosis not present

## 2018-03-21 LAB — I-STAT TROPONIN, ED: Troponin i, poc: 0.03 ng/mL (ref 0.00–0.08)

## 2018-03-21 LAB — BASIC METABOLIC PANEL
Anion gap: 15 (ref 5–15)
BUN: 20 mg/dL (ref 8–23)
CO2: 25 mmol/L (ref 22–32)
Calcium: 9 mg/dL (ref 8.9–10.3)
Chloride: 97 mmol/L — ABNORMAL LOW (ref 98–111)
Creatinine, Ser: 1.19 mg/dL — ABNORMAL HIGH (ref 0.44–1.00)
GFR calc Af Amer: 48 mL/min — ABNORMAL LOW (ref >60)
GFR calc non Af Amer: 42 mL/min — ABNORMAL LOW (ref >60)
Glucose, Bld: 104 mg/dL — ABNORMAL HIGH (ref 70–99)
Potassium: 3 mmol/L — ABNORMAL LOW (ref 3.5–5.1)
Sodium: 137 mmol/L (ref 135–145)

## 2018-03-21 LAB — CBC
HCT: 18.7 % — ABNORMAL LOW (ref 36.0–46.0)
Hemoglobin: 4.7 g/dL — CL (ref 12.0–15.0)
MCH: 17.9 pg — ABNORMAL LOW (ref 26.0–34.0)
MCHC: 25.1 g/dL — ABNORMAL LOW (ref 30.0–36.0)
MCV: 71.4 fL — ABNORMAL LOW (ref 78.0–100.0)
Platelets: 405 10*3/uL — ABNORMAL HIGH (ref 150–400)
RBC: 2.62 MIL/uL — ABNORMAL LOW (ref 3.87–5.11)
RDW: 20.3 % — ABNORMAL HIGH (ref 11.5–15.5)
WBC: 12.4 10*3/uL — ABNORMAL HIGH (ref 4.0–10.5)

## 2018-03-21 LAB — PREPARE RBC (CROSSMATCH)

## 2018-03-21 LAB — POC OCCULT BLOOD, ED: FECAL OCCULT BLD: POSITIVE — AB

## 2018-03-21 MED ORDER — ACETAMINOPHEN 500 MG PO TABS
500.0000 mg | ORAL_TABLET | Freq: Four times a day (QID) | ORAL | Status: DC | PRN
  Administered 2018-03-23 (×2): 500 mg via ORAL
  Filled 2018-03-21 (×2): qty 1

## 2018-03-21 MED ORDER — DICLOFENAC SODIUM 1 % TD GEL
4.0000 g | Freq: Four times a day (QID) | TRANSDERMAL | Status: DC
  Filled 2018-03-21: qty 100

## 2018-03-21 MED ORDER — SODIUM CHLORIDE 0.9% IV SOLUTION
Freq: Once | INTRAVENOUS | Status: AC
  Administered 2018-03-21: 22:00:00 via INTRAVENOUS

## 2018-03-21 MED ORDER — ONDANSETRON HCL 4 MG PO TABS
4.0000 mg | ORAL_TABLET | Freq: Four times a day (QID) | ORAL | Status: DC | PRN
  Administered 2018-03-22 (×2): 4 mg via ORAL
  Filled 2018-03-21 (×2): qty 1

## 2018-03-21 MED ORDER — FUROSEMIDE 20 MG PO TABS
120.0000 mg | ORAL_TABLET | Freq: Every day | ORAL | Status: DC
  Filled 2018-03-21: qty 8

## 2018-03-21 MED ORDER — POTASSIUM CHLORIDE CRYS ER 20 MEQ PO TBCR
80.0000 meq | EXTENDED_RELEASE_TABLET | Freq: Once | ORAL | Status: AC
  Administered 2018-03-21: 80 meq via ORAL
  Filled 2018-03-21: qty 4

## 2018-03-21 MED ORDER — POTASSIUM CHLORIDE CRYS ER 20 MEQ PO TBCR
40.0000 meq | EXTENDED_RELEASE_TABLET | Freq: Every day | ORAL | Status: DC
  Administered 2018-03-22 – 2018-03-24 (×2): 40 meq via ORAL
  Filled 2018-03-21 (×2): qty 2

## 2018-03-21 MED ORDER — LIDOCAINE 5 % EX PTCH
1.0000 | MEDICATED_PATCH | CUTANEOUS | Status: DC
  Filled 2018-03-21: qty 1

## 2018-03-21 MED ORDER — ONDANSETRON HCL 4 MG/2ML IJ SOLN: 4.0000 mg | Freq: Four times a day (QID) | INTRAMUSCULAR | Status: DC | PRN

## 2018-03-21 MED ORDER — NYSTATIN 100000 UNIT/ML MT SUSP
5.0000 mL | Freq: Four times a day (QID) | OROMUCOSAL | Status: DC
  Administered 2018-03-22: 500000 [IU] via ORAL
  Filled 2018-03-21 (×6): qty 5

## 2018-03-21 MED ORDER — MAGIC MOUTHWASH W/LIDOCAINE
5.0000 mL | Freq: Four times a day (QID) | ORAL | Status: DC | PRN
  Filled 2018-03-21: qty 5

## 2018-03-21 MED ORDER — SODIUM CHLORIDE 0.9% IV SOLUTION: Freq: Once | INTRAVENOUS | Status: DC

## 2018-03-21 MED ORDER — TRIAMTERENE-HCTZ 37.5-25 MG PO TABS
1.0000 | ORAL_TABLET | Freq: Every day | ORAL | Status: DC
  Administered 2018-03-22: 1 via ORAL
  Filled 2018-03-21: qty 1

## 2018-03-21 NOTE — ED Provider Notes (Signed)
[facility] EMERGENCY DEPARTMENT Provider Note   CSN: [id number] Arrival date & time: 03/21/18  8588     History   Chief Complaint Chief Complaint  Patient presents with  . Chest Pain  . Shortness of Breath    HPI Chelsea Keller is a 83 y.o. female.  HPI   83 year old female with multiple complaints.  She has had progressive generalized weakness.  Dyspnea with exertion.  Near syncopal symptoms, particularly with changes in position.  Substernal chest pressure.  Symptoms have been worsening over the past several weeks.  Has had progressive edema.  She has had significant pain in her hip which is limiting her ambulation.  She has been having difficulty getting up at night to urinate so stopped taking her Lasix last month.  She reports compliance with other medications.  She is on Eliquis for history of PE.  She denies any bright red blood per rectum or melena but states that "I always have microscopic blood in my stool."  Past Medical History:  Diagnosis Date  . Allergy   . Anemia   . Anxiety   . Arthritis   . Arthritis of sacroiliac joint of both sides 11/12/2017  . Bilateral pulmonary embolism ([place]) 06/23/2017  . Chronic diastolic CHF (congestive heart failure) (HCC)    a. Echo 1/16:  mild LVH, EF normal, grade 1 DD, MAC  . Chronic venous insufficiency    chronic LE edema  . DDD (degenerative disc disease), lumbar 11/12/2017  . Degenerative joint disease (DJD) of hip, Bilateral  11/12/2017  . Depression   . Fibromyalgia    constant pain  . Hx of cardiac catheterization    a. LHC in [place] "ok" per patient with mild plaque in a single vessel - records not available  . Hx of cardiovascular stress test    a. Nuclear study in 2008 normal  . Hx of colonic polyps   . Hypertension   . Hypertriglyceridemia   . Impaired fasting glucose   . PONV (postoperative nausea and vomiting)   . PUD (peptic ulcer disease)    hx of gastric ulcer  . Pulmonary emboli ([place])  06/2017  . Vitamin B12 deficiency     Patient Active Problem List   Diagnosis Date Noted  . Degenerative joint disease (DJD) of hip, Bilateral  11/12/2017  . DDD (degenerative disc disease), lumbar 11/12/2017  . Arthritis of sacroiliac joint of both sides 11/12/2017  . History of pulmonary embolism 11/07/2017  . Chronic anticoagulation 11/07/2017  . Diverticulitis of small intestine with perforation 11/07/2017  . Small bowel obstruction ([place]) 11/06/2017  . Neurodermatitis 07/28/2017  . Urinary incontinence 05/26/2017  . Intractable pain-left hip 04/03/2017  . Fibromyalgia 04/03/2017  . Anemia 04/03/2017  . Chronic diastolic CHF (congestive heart failure) ([place]) 04/03/2017  . Hypertension 04/03/2017  . Hypertriglyceridemia 04/03/2017  . Vitamin B12 deficiency 04/03/2017  . CKD (chronic kidney disease) stage 3, GFR 30-59 ml/min (HCC) 04/03/2017  . Acute hypokalemia 04/03/2017  . Left sided sciatica   . Advance directive discussed with patient 01/19/2017  . Chronic diastolic congestive heart failure ([place]) 05/09/2014  . Mood disorder ([place]) 11/03/2013  . Knee joint pain 10/31/2013  . Psoriasis 02/21/2013  . Routine general medical examination at a health care facility 06/14/2012  . BMI 50.0-59.9, adult ([place]) 12/02/2011  . VENTRICULAR HYPERTROPHY, LEFT 10/19/2008  . Sleep disturbance 10/25/2007  . Venous (peripheral) insufficiency 08/11/2007  . Osteoarthritis, multiple sites 08/11/2007  . Impaired fasting glucose 08/11/2007  .  COLONIC POLYPS, HX OF 05/20/2007  . B12 DEFICIENCY 09/30/2006  . Essential hypertension 09/30/2006  . Allergic rhinitis due to pollen 09/30/2006    Past Surgical History:  Procedure Laterality Date  . ABDOMINAL HYSTERECTOMY    . CATARACT EXTRACTION  10/2003   OD  . CHOLECYSTECTOMY    . COLONOSCOPY W/ POLYPECTOMY    . COLONOSCOPY WITH PROPOFOL N/A 10/15/2014   Procedure: COLONOSCOPY WITH PROPOFOL;  Surgeon: Ladene Artist, MD;  Location: [facility];   Service: Endoscopy;  Laterality: N/A;  . ESOPHAGOGASTRODUODENOSCOPY (EGD) WITH PROPOFOL N/A 10/15/2014   Procedure: ESOPHAGOGASTRODUODENOSCOPY (EGD) WITH PROPOFOL;  Surgeon: Ladene Artist, MD;  Location: [facility];  Service: Endoscopy;  Laterality: N/A;  . EXTERNAL FIXATION ANKLE FRACTURE     Fx.  left ankle-fixation with pins later removed sec to infection 1985  . EXTERNAL FIXATION WRIST FRACTURE  1985   left with pins  . EYE SURGERY    . FEMUR FRACTURE SURGERY  06/2009  . FRACTURE SURGERY    . HARDWARE REMOVAL Left 10/05/2013   Procedure: HARDWARE REMOVAL LEFT DISTAL FEMUR;  Surgeon: Rozanna Box, MD;  Location: [facility];  Service: Orthopedics;  Laterality: Left;  . HEMIARTHROPLASTY SHOULDER FRACTURE  06/2009  . JOINT REPLACEMENT    . STERIOD INJECTION Right 10/05/2013   Procedure: STEROID INJECTION;  Surgeon: Rozanna Box, MD;  Location: [facility];  Service: Orthopedics;  Laterality: Right;  . TONSILLECTOMY    . TONSILLECTOMY    . TOTAL KNEE ARTHROPLASTY  03/09   left     OB History   None      Home Medications    Prior to Admission medications   Medication Sig Start Date End Date Taking? Authorizing Provider  acetaminophen (TYLENOL) 500 MG tablet Take 500 mg by mouth every 6 (six) hours as needed for headache (pain).    [provider]  apixaban (ELIQUIS) 5 MG TABS tablet Take 1 tablet (5 mg total) by mouth 2 (two) times daily. 09/14/17   Venia Carbon, MD  diclofenac sodium (VOLTAREN) 1 % GEL Apply 4 g topically 4 (four) times daily. 02/13/18   Palumbo, April, MD  furosemide (LASIX) 80 MG tablet Take 1.5-2 tablets (120-160 mg total) by mouth daily. 02/03/18   Venia Carbon, MD  gabapentin (NEURONTIN) 300 MG capsule Take 1 capsule (300 mg total) by mouth 3 (three) times daily. Patient taking differently: Take 300 mg by mouth See admin instructions. Tapering up to 300 mg three times daily: 5/4, 5/5, 5/6 - take one capsule (300 mg) every morning and two 100 mg  capsules at bedtime; 5/7, 5/8, 5/9 take one capsule (300 mg) twice daily, 5/10, 5/11, 5/12, take one capsule (300 mg) every morning and one capsule (300 mg) with a 100 mg capsule at bedtime, 5/13, 5/14, 5/15 - take 800 mg during the day (unknown directions), 5/16 and going forward take one capsule (300 mg) three times daily 10/25/17   Viviana Simpler I, MD  lidocaine (LIDODERM) 5 % Place 1 patch onto the skin daily. Remove & Discard patch within 12 hours or as directed by MD 02/13/18   Randal Buba, April, MD  magic mouthwash w/lidocaine SOLN Take 5 mLs by mouth 4 (four) times daily as needed for mouth pain. Rinse and spit out 02/21/18   Venia Carbon, MD  nystatin (MYCOSTATIN) 100000 UNIT/ML suspension Take 5 mLs (500,000 Units total) by mouth 4 (four) times daily. 03/02/18   Venia Carbon, MD  potassium  chloride SA (K-DUR,KLOR-CON) 20 MEQ tablet TAKE 2 TABLETS (40 MEQ TOTAL) DAILY 06/10/17   Venia Carbon, MD  predniSONE (DELTASONE) 20 MG tablet 3 tabs po day one, then 2 po daily x 4 days 02/13/18   Palumbo, April, MD  traMADol (ULTRAM) 50 MG tablet Take 1-2 tablets (50-100 mg total) by mouth 3 (three) times daily as needed. 02/24/18   Venia Carbon, MD  triamterene-hydrochlorothiazide (MAXZIDE-25) 37.5-25 MG tablet Take 1 tablet by mouth daily.    [provider]    Family History Family History  Problem Relation Age of Onset  . Depression Mother   . Cancer Mother        uterine cancer  . Heart attack Father   . Cancer Brother        prostate cancer  . Diabetes Maternal Aunt   . Arthritis Brother   . Asthma Brother   . Stroke Maternal Grandmother   . Pulmonary embolism Daughter     Social History Social History   Tobacco Use  . Smoking status: Former Smoker    Packs/day: 1.00    Years: 40.00    Pack years: 40.00    Types: Cigarettes    Last attempt to quit: 07/06/1993    Years since quitting: 24.7  . Smokeless tobacco: Never Used  Substance Use Topics  . Alcohol  use: No    Alcohol/week: 0.0 standard drinks  . Drug use: No     Allergies   Codeine sulfate; Celecoxib; and Erythromycin base   Review of Systems Review of Systems  All systems reviewed and negative, other than as noted in HPI.  Physical Exam Updated Vital Signs BP 114/87   Pulse 87   Temp 98.3 F (36.8 C) (Oral)   Resp (!) 22   SpO2 99%   Physical Exam  Constitutional: She appears well-developed and well-nourished. No distress.  HENT:  Head: Normocephalic and atraumatic.  Eyes: Conjunctivae are normal. Right eye exhibits no discharge. Left eye exhibits no discharge.  Neck: Neck supple.  Cardiovascular: Normal rate, regular rhythm and normal heart sounds. Exam reveals no gallop and no friction rub.  No murmur heard. Pulmonary/Chest: Effort normal and breath sounds normal. No respiratory distress.  Abdominal: Soft. She exhibits no distension. There is no tenderness.  Genitourinary:  Genitourinary Comments: Nurse present for DRE. Soft brown stool.   Musculoskeletal: She exhibits no tenderness.       Right lower leg: She exhibits edema.       Left lower leg: She exhibits edema.  Neurological: She is alert.  Skin: Skin is warm and dry. There is pallor.  Psychiatric: She has a normal mood and affect. Her behavior is normal. Thought content normal.  Nursing note and vitals reviewed.    ED Treatments / Results  Labs (all labs ordered are listed, but only abnormal results are displayed) Labs Reviewed  BASIC METABOLIC PANEL - Abnormal; Notable for the following components:      Result Value   Potassium 3.0 (*)    Chloride 97 (*)    Glucose, Bld 104 (*)    Creatinine, Ser 1.19 (*)    GFR calc non Af Amer 42 (*)    GFR calc Af Amer 48 (*)    All other components within normal limits  CBC - Abnormal; Notable for the following components:   WBC 12.4 (*)    RBC 2.62 (*)    Hemoglobin 4.7 (*)    HCT 18.7 (*)  MCV 71.4 (*)    MCH 17.9 (*)    MCHC 25.1 (*)    RDW  20.3 (*)    Platelets 405 (*)    All other components within normal limits  OCCULT BLOOD X 1 CARD TO LAB, STOOL  I-STAT TROPONIN, ED  TYPE AND SCREEN  PREPARE RBC (CROSSMATCH)    EKG EKG Interpretation  Date/Time:  Monday March 21 2018 18:44:51 EDT Ventricular Rate:  91 PR Interval:    QRS Duration: 84 QT Interval:  390 QTC Calculation: 479 R Axis:   -4 Text Interpretation:  Sinus rhythm Low voltage QRS Abnormal ECG Confirmed by Virgel Manifold [phone]) on 03/21/2018 8:12:15 PM   Radiology Dg Chest 2 View  Result Date: 03/21/2018 CLINICAL DATA:  Bilateral chest pain EXAM: CHEST - 2 VIEW COMPARISON:  11/06/2017 FINDINGS: Stable mild cardiomegaly. Nonaneurysmal atherosclerotic aorta. No acute pulmonary consolidation, mass or edema. No effusion or pneumothorax. Osteoarthritis of the right AC joint with subchondral areas of sclerosis involving the right humeral head suspicious for changes of AVN, unchanged in appearance. IMPRESSION: No active pulmonary disease. Aortic atherosclerosis without aneurysm. Probable AVN of the right humeral head. Electronically Signed   By: Ashley Royalty M.D.   On: 03/21/2018 19:38    Procedures Procedures (including critical care time)  CRITICAL CARE Performed by: Virgel Manifold Total critical care time: 35 minutes Critical care time was exclusive of separately billable procedures and treating other patients. Critical care was necessary to treat or prevent imminent or life-threatening deterioration. Critical care was time spent personally by me on the following activities: development of treatment plan with patient and/or surrogate as well as nursing, discussions with consultants, evaluation of patient's response to treatment, examination of patient, obtaining history from patient or surrogate, ordering and performing treatments and interventions, ordering and review of laboratory studies, ordering and review of radiographic studies, pulse oximetry and  re-evaluation of patient's condition.   Medications Ordered in ED Medications - No data to display   Initial Impression / Assessment and Plan / ED Course  I have reviewed the triage vital signs and the nursing notes.  Pertinent labs & imaging results that were available during my care of the patient were reviewed by me and considered in my medical decision making (see chart for details).     83 year old female with generalized weakness, orthostatic symptoms, dyspnea and chest pain.  Severely anemic.  Hemoccult pending.  She has not been taking her Lasix since August. Very volume overloaded. Some of anemia likely dilution but doubtful that explains it all to this degree. Given her symptoms, will defer initiating diuretics until we begin transfusion. She has no respiratory complaints at rest.   Final Clinical Impressions(s) / ED Diagnoses   Final diagnoses:  Symptomatic anemia  Peripheral edema  Hypokalemia    ED Discharge Orders    None       Virgel Manifold, MD 04/01/18 1627

## 2018-03-21 NOTE — H&P (Addendum)
History and Physical   [name] ZOX:[id number] DOB: 05/18/1935 DOA: 03/21/2018  Referring MD/NP/PA: Dr. Wilson Singer  PCP: Venia Carbon, MD   Patient coming from: Home  Chief Complaint: Weakness and shortness of breath with exertion  HPI: Chelsea Keller is a 83 y.o. female with medical history significant of psoriasis, pulmonary embolism on Eliquis, diverticular disease, osteoarthritis, morbid obesity, B12 deficiency who presented to the ER with symptomatic anemia.  Patient was having generalized weakness with exertional dyspnea.  She denied any melena, denied any bright red blood per rectum, denied any hematochezia.  Patient was found to have a hemoglobin of 4.7.  She was one-time diagnosed with anemia according to patient and transfuse but many years ago.  She had colonoscopy also several years ago.  Patient's stool was brown but positive occult blood.  At this point she is being admitted for management of symptomatic anemia.  Patient was admitted in May of this year with similar problem and small bowel obstruction.  Her hemoglobin at that point was 7.5.  ED Course: Patient's vitals are stable with blood pressure 145/55 pulse 91 and respirate of 21 oxygen sat 99% on room air.  White count is 12.4 with hemoglobin 4.7 hematocrit 18.7.  Platelets is 405.  Sodium 137 potassium is 3.0 chloride 97 CO2 25 with creatinine 1.19 and glucose 104.  Fecal occult blood testing was positive x2.  Review of Systems: As per HPI otherwise 10 point review of systems negative.    Past Medical History:  Diagnosis Date  . Allergy   . Anemia   . Anxiety   . Arthritis   . Arthritis of sacroiliac joint of both sides 11/12/2017  . Bilateral pulmonary embolism ([place]) 06/23/2017  . Chronic diastolic CHF (congestive heart failure) (HCC)    a. Echo 1/16:  mild LVH, EF normal, grade 1 DD, MAC  . Chronic venous insufficiency    chronic LE edema  . DDD (degenerative disc disease), lumbar 11/12/2017  . Degenerative  joint disease (DJD) of hip, Bilateral  11/12/2017  . Depression   . Fibromyalgia    constant pain  . Hx of cardiac catheterization    a. [facility] in [place] "ok" per patient with mild plaque in a single vessel - records not available  . Hx of cardiovascular stress test    a. Nuclear study in 2008 normal  . Hx of colonic polyps   . Hypertension   . Hypertriglyceridemia   . Impaired fasting glucose   . PONV (postoperative nausea and vomiting)   . PUD (peptic ulcer disease)    hx of gastric ulcer  . Pulmonary emboli ([place]) 06/2017  . Vitamin B12 deficiency     Past Surgical History:  Procedure Laterality Date  . ABDOMINAL HYSTERECTOMY    . CATARACT EXTRACTION  10/2003   OD  . CHOLECYSTECTOMY    . COLONOSCOPY W/ POLYPECTOMY    . COLONOSCOPY WITH PROPOFOL N/A 10/15/2014   Procedure: COLONOSCOPY WITH PROPOFOL;  Surgeon: Ladene Artist, MD;  Location: [facility];  Service: Endoscopy;  Laterality: N/A;  . ESOPHAGOGASTRODUODENOSCOPY (EGD) WITH PROPOFOL N/A 10/15/2014   Procedure: ESOPHAGOGASTRODUODENOSCOPY (EGD) WITH PROPOFOL;  Surgeon: Ladene Artist, MD;  Location: [facility];  Service: Endoscopy;  Laterality: N/A;  . EXTERNAL FIXATION ANKLE FRACTURE     Fx.  left ankle-fixation with pins later removed sec to infection 1985  . EXTERNAL FIXATION WRIST FRACTURE  1985   left with pins  . EYE SURGERY    . FEMUR FRACTURE  SURGERY  06/2009  . FRACTURE SURGERY    . HARDWARE REMOVAL Left 10/05/2013   Procedure: HARDWARE REMOVAL LEFT DISTAL FEMUR;  Surgeon: Rozanna Box, MD;  Location: [place] [facility];  Service: Orthopedics;  Laterality: Left;  . HEMIARTHROPLASTY SHOULDER FRACTURE  06/2009  . JOINT REPLACEMENT    . STERIOD INJECTION Right 10/05/2013   Procedure: STEROID INJECTION;  Surgeon: Rozanna Box, MD;  Location: [place];  Service: Orthopedics;  Laterality: Right;  . TONSILLECTOMY    . TONSILLECTOMY    . TOTAL KNEE ARTHROPLASTY  03/09   left     reports that she quit smoking about 24 years ago.  Her smoking use included cigarettes. She has a 40.00 pack-year smoking history. She has never used smokeless tobacco. She reports that she does not drink alcohol or use drugs.  Allergies  Allergen Reactions  . Codeine Sulfate Shortness Of Breath  . Celecoxib Other (See Comments)    Caused vaginal bleeding  . Erythromycin Base Nausea And Vomiting    Family History  Problem Relation Age of Onset  . Depression Mother   . Cancer Mother        uterine cancer  . Heart attack Father   . Cancer Brother        prostate cancer  . Diabetes Maternal Aunt   . Arthritis Brother   . Asthma Brother   . Stroke Maternal Grandmother   . Pulmonary embolism Daughter      Prior to Admission medications   Medication Sig Start Date End Date Taking? Authorizing Provider  acetaminophen (TYLENOL) 500 MG tablet Take 500 mg by mouth every 6 (six) hours as needed for headache (pain).    [provider]  apixaban (ELIQUIS) 5 MG TABS tablet Take 1 tablet (5 mg total) by mouth 2 (two) times daily. 09/14/17   Venia Carbon, MD  diclofenac sodium (VOLTAREN) 1 % GEL Apply 4 g topically 4 (four) times daily. 02/13/18   Palumbo, April, MD  furosemide (LASIX) 80 MG tablet Take 1.5-2 tablets (120-160 mg total) by mouth daily. 02/03/18   Venia Carbon, MD  gabapentin (NEURONTIN) 300 MG capsule Take 1 capsule (300 mg total) by mouth 3 (three) times daily. Patient taking differently: Take 300 mg by mouth See admin instructions. Tapering up to 300 mg three times daily: 5/4, 5/5, 5/6 - take one capsule (300 mg) every morning and two 100 mg capsules at bedtime; 5/7, 5/8, 5/9 take one capsule (300 mg) twice daily, 5/10, 5/11, 5/12, take one capsule (300 mg) every morning and one capsule (300 mg) with a 100 mg capsule at bedtime, 5/13, 5/14, 5/15 - take 800 mg during the day (unknown directions), 5/16 and going forward take one capsule (300 mg) three times daily 10/25/17   Viviana Simpler I, MD  lidocaine (LIDODERM) 5  % Place 1 patch onto the skin daily. Remove & Discard patch within 12 hours or as directed by MD 02/13/18   Randal Buba, April, MD  magic mouthwash w/lidocaine SOLN Take 5 mLs by mouth 4 (four) times daily as needed for mouth pain. Rinse and spit out 02/21/18   Venia Carbon, MD  nystatin (MYCOSTATIN) 100000 UNIT/ML suspension Take 5 mLs (500,000 Units total) by mouth 4 (four) times daily. 03/02/18   Venia Carbon, MD  potassium chloride SA (K-DUR,KLOR-CON) 20 MEQ tablet TAKE 2 TABLETS (40 MEQ TOTAL) DAILY 06/10/17   Viviana Simpler I, MD  predniSONE (DELTASONE) 20 MG tablet 3 tabs po day one,  then 2 po daily x 4 days 02/13/18   Randal Buba, April, MD  traMADol (ULTRAM) 50 MG tablet Take 1-2 tablets (50-100 mg total) by mouth 3 (three) times daily as needed. 02/24/18   Venia Carbon, MD  triamterene-hydrochlorothiazide (MAXZIDE-25) 37.5-25 MG tablet Take 1 tablet by mouth daily.    [provider]    Physical Exam: Vitals:   03/21/18 1847 03/21/18 2031 03/21/18 2032  BP: 114/87 (!) 145/55   Pulse: 87  82  Resp: (!) 22  (!) 30  Temp: 98.3 F (36.8 C)    TempSrc: Oral    SpO2: 99%  100%      Constitutional: NAD, calm, comfortable Vitals:   03/21/18 1847 03/21/18 2031 03/21/18 2032  BP: 114/87 (!) 145/55   Pulse: 87  82  Resp: (!) 22  (!) 30  Temp: 98.3 F (36.8 C)    TempSrc: Oral    SpO2: 99%  100%    Morbidly obese with no acute distress Eyes: PERRL, lids and conjunctivae normal ENMT: Mucous membranes are moist. Posterior pharynx clear of any exudate or lesions.Normal dentition.  Neck: normal, supple, no masses, no thyromegaly Respiratory: clear to auscultation bilaterally, no wheezing, no crackles. Normal respiratory effort. No accessory muscle use.  Cardiovascular: Regular rate and rhythm, no murmurs / rubs / gallops. No extremity edema. 2+ pedal pulses. No carotid bruits.  Abdomen: no tenderness, no masses palpated. No hepatosplenomegaly. Bowel sounds positive.    Musculoskeletal: no clubbing / cyanosis. No joint deformity upper and lower extremities. Good ROM, no contractures. Normal muscle tone.  Skin: no rashes, lesions, ulcers. No induration, lateral lower extremity stasis edema Neurologic: CN 2-12 grossly intact. Sensation intact, DTR normal. Strength 5/5 in all 4.  Psychiatric: Normal judgment and insight. Alert and oriented x 3. Normal mood.     Labs on Admission: I have personally reviewed following labs and imaging studies  CBC: Recent Labs  Lab 03/21/18 1855  WBC 12.4*  HGB 4.7*  HCT 18.7*  MCV 71.4*  PLT 623*   Basic Metabolic Panel: Recent Labs  Lab 03/21/18 1855  NA 137  K 3.0*  CL 97*  CO2 25  GLUCOSE 104*  BUN 20  CREATININE 1.19*  CALCIUM 9.0   GFR: CrCl cannot be calculated (Unknown ideal weight.). Liver Function Tests: No results for input(s): AST, ALT, ALKPHOS, BILITOT, PROT, ALBUMIN in the last 168 hours. No results for input(s): LIPASE, AMYLASE in the last 168 hours. No results for input(s): AMMONIA in the last 168 hours. Coagulation Profile: No results for input(s): INR, PROTIME in the last 168 hours. Cardiac Enzymes: No results for input(s): CKTOTAL, CKMB, CKMBINDEX, TROPONINI in the last 168 hours. BNP (last 3 results) No results for input(s): PROBNP in the last 8760 hours. HbA1C: No results for input(s): HGBA1C in the last 72 hours. CBG: No results for input(s): GLUCAP in the last 168 hours. Lipid Profile: No results for input(s): CHOL, HDL, LDLCALC, TRIG, CHOLHDL, LDLDIRECT in the last 72 hours. Thyroid Function Tests: No results for input(s): TSH, T4TOTAL, FREET4, T3FREE, THYROIDAB in the last 72 hours. Anemia Panel: No results for input(s): VITAMINB12, FOLATE, FERRITIN, TIBC, IRON, RETICCTPCT in the last 72 hours. Urine analysis:    Component Value Date/Time   COLORURINE YELLOW 06/23/2017 0928   APPEARANCEUR CLEAR 06/23/2017 0928   LABSPEC 1.021 06/23/2017 0928   PHURINE 5.0 06/23/2017  0928   GLUCOSEU NEGATIVE 06/23/2017 0928   HGBUR NEGATIVE 06/23/2017 0928   HGBUR negative 01/13/2010 1056  BILIRUBINUR NEGATIVE 06/23/2017 0928   BILIRUBINUR negative 02/11/2017 1709   KETONESUR NEGATIVE 06/23/2017 0928   PROTEINUR NEGATIVE 06/23/2017 0928   UROBILINOGEN 0.2 02/11/2017 1709   UROBILINOGEN 0.2 01/13/2010 1056   NITRITE NEGATIVE 06/23/2017 0928   LEUKOCYTESUR NEGATIVE 06/23/2017 0928   Sepsis Labs: _0 (procalcitonin:4,lacticidven:4) )No results found for this or any previous visit (from the past 240 hour(s)).   Radiological Exams on Admission: Dg Chest 2 View  Result Date: 03/21/2018 CLINICAL DATA:  Bilateral chest pain EXAM: CHEST - 2 VIEW COMPARISON:  11/06/2017 FINDINGS: Stable mild cardiomegaly. Nonaneurysmal atherosclerotic aorta. No acute pulmonary consolidation, mass or edema. No effusion or pneumothorax. Osteoarthritis of the right AC joint with subchondral areas of sclerosis involving the right humeral head suspicious for changes of AVN, unchanged in appearance. IMPRESSION: No active pulmonary disease. Aortic atherosclerosis without aneurysm. Probable AVN of the right humeral head. Electronically Signed   By: Ashley Royalty M.D.   On: 03/21/2018 19:38    Assessment/Plan Principal Problem:   Anemia Active Problems:   Essential hypertension   Venous (peripheral) insufficiency   Vitamin B12 deficiency   Symptomatic anemia     #1 symptomatic anemia: Patient is guaiac positive on chronic anticoagulation.  She must have been having slow GI bleed.  She also had previous history of B12 deficiency.  I will check iron studies with U82 and folic acid.  Transfuse up to 4 units of packed red blood cells.  GI consultation.  Initiate Protonix.  Patient will likely need EGD or colonoscopy.  She denied any NSAIDs.  #2 history of PE: I will hold apixaban for now.  Will need work-up initially.  #3 hypertension: Continue home regimen and monitor  #4 morbid obesity:  Counseling provided.  #5 chronic kidney disease stage III: Appears stable.  At baseline.  #6 history of vitamin B12 deficiency: Recheck B12 level.  Replete if low.   DVT prophylaxis: SCD Code Status: Full code Family Communication: No family available Disposition Plan: To be determined Consults called: Gastroenterology, Dr. Michail Sermon Admission status: Inpatient  Severity of Illness: The appropriate patient status for this patient is INPATIENT. Inpatient status is judged to be reasonable and necessary in order to provide the required intensity of service to ensure the patient's safety. The patient's presenting symptoms, physical exam findings, and initial radiographic and laboratory data in the context of their chronic comorbidities is felt to place them at high risk for further clinical deterioration. Furthermore, it is not anticipated that the patient will be medically stable for discharge from the hospital within 2 midnights of admission. The following factors support the patient status of inpatient.   " The patient's presenting symptoms include shortness of breath and weakness. " The worrisome physical exam findings include conjunctival pallor. " The initial radiographic and laboratory data are worrisome because of hemoglobin 4.7. " The chronic co-morbidities include history of pulmonary embolism on chronic anticoagulation.   * I certify that at the point of admission it is my clinical judgment that the patient will require inpatient hospital care spanning beyond 2 midnights from the point of admission due to high intensity of service, high risk for further deterioration and high frequency of surveillance required.Barbette Merino MD [facility] Pager [phone]  If 7PM-7AM, please contact night-coverage [email or web address] Password [facility]  03/21/2018, 9:48 PM

## 2018-03-21 NOTE — Telephone Encounter (Signed)
I talked to patient's daughter and she will continue Lasix, but she is animate about seeing you in the office and no other.  I found an opening next Monday. They were thankful for the call.

## 2018-03-21 NOTE — Telephone Encounter (Signed)
Spoke to patient's daughter [name] [phone] or [phone]).  Patient is having SOB, chest heaviness and dizziness).  She has not been taking her Lasix, because she has hip pain and is immobile.  She only wants to see Dr Angelena Form, but I cannot find opening.  I told her to resume Lasix and go to the ED if symptoms worsen.  She verbalized understanding.

## 2018-03-21 NOTE — Telephone Encounter (Signed)
I am not in the office this week. I agree with restarting lasix but it would be optimal for her to come in to be evaluated in the office if possible. She refused to see anyone else? Gerald Stabs

## 2018-03-21 NOTE — ED Notes (Signed)
No adverse reaction to blood administration, rate changed to 223ml/hr. Will continue to monitor.

## 2018-03-21 NOTE — ED Triage Notes (Signed)
Pt reports CP, SHOB, bilateral leg swelling, nausea, and dizziness. Pt reports generalized CP. Pt reports both arms are tingly. Pt has hx of CHF.

## 2018-03-21 NOTE — Telephone Encounter (Signed)
I spoke to her just now and gave her options to increase Lasix to 80 mg po BID for three days or to come to the ED today for evaluation. She will call her daughter now and will come into the ED. We can leave her appt for Monday just in case we need it.   Thanks,  Gerald Stabs

## 2018-03-22 ENCOUNTER — Encounter (HOSPITAL_COMMUNITY): Payer: Self-pay

## 2018-03-22 DIAGNOSIS — R110 Nausea: Secondary | ICD-10-CM

## 2018-03-22 DIAGNOSIS — R748 Abnormal levels of other serum enzymes: Secondary | ICD-10-CM

## 2018-03-22 DIAGNOSIS — K5520 Angiodysplasia of colon without hemorrhage: Secondary | ICD-10-CM

## 2018-03-22 DIAGNOSIS — K625 Hemorrhage of anus and rectum: Secondary | ICD-10-CM

## 2018-03-22 DIAGNOSIS — K922 Gastrointestinal hemorrhage, unspecified: Secondary | ICD-10-CM

## 2018-03-22 LAB — CBC
HCT: 23.3 % — ABNORMAL LOW (ref 36.0–46.0)
HEMATOCRIT: 28.5 % — AB (ref 36.0–46.0)
HEMOGLOBIN: 6.4 g/dL — AB (ref 12.0–15.0)
Hemoglobin: 8.4 g/dL — ABNORMAL LOW (ref 12.0–15.0)
MCH: 20.8 pg — ABNORMAL LOW (ref 26.0–34.0)
MCH: 22.7 pg — AB (ref 26.0–34.0)
MCHC: 27.5 g/dL — AB (ref 30.0–36.0)
MCHC: 29.5 g/dL — ABNORMAL LOW (ref 30.0–36.0)
MCV: 75.9 fL — ABNORMAL LOW (ref 78.0–100.0)
MCV: 77 fL — AB (ref 78.0–100.0)
Platelets: 340 10*3/uL (ref 150–400)
Platelets: 329 10*3/uL (ref 150–400)
RBC: 3.07 MIL/uL — ABNORMAL LOW (ref 3.87–5.11)
RBC: 3.7 MIL/uL — AB (ref 3.87–5.11)
RDW: 22.8 % — ABNORMAL HIGH (ref 11.5–15.5)
RDW: 21.8 % — AB (ref 11.5–15.5)
WBC: 12.6 10*3/uL — ABNORMAL HIGH (ref 4.0–10.5)
WBC: 12.3 10*3/uL — AB (ref 4.0–10.5)

## 2018-03-22 LAB — COMPREHENSIVE METABOLIC PANEL
ALBUMIN: 2.7 g/dL — AB (ref 3.5–5.0)
ALK PHOS: 65 U/L (ref 38–126)
ALT: 10 U/L (ref 0–44)
AST: 16 U/L (ref 15–41)
Anion gap: 16 — ABNORMAL HIGH (ref 5–15)
BUN: 21 mg/dL (ref 8–23)
CHLORIDE: 98 mmol/L (ref 98–111)
CO2: 25 mmol/L (ref 22–32)
CREATININE: 1.24 mg/dL — AB (ref 0.44–1.00)
Calcium: 8.7 mg/dL — ABNORMAL LOW (ref 8.9–10.3)
GFR calc Af Amer: 46 mL/min — ABNORMAL LOW (ref >60)
GFR calc non Af Amer: 40 mL/min — ABNORMAL LOW (ref >60)
Glucose, Bld: 109 mg/dL — ABNORMAL HIGH (ref 70–99)
POTASSIUM: 2.9 mmol/L — AB (ref 3.5–5.1)
SODIUM: 139 mmol/L (ref 135–145)
Total Bilirubin: 1.9 mg/dL — ABNORMAL HIGH (ref 0.3–1.2)
Total Protein: 4.9 g/dL — ABNORMAL LOW (ref 6.5–8.1)

## 2018-03-22 LAB — IRON AND TIBC
Iron: 32 ug/dL (ref 28–170)
SATURATION RATIOS: 8 % — AB (ref 10.4–31.8)
TIBC: 414 ug/dL (ref 250–450)
UIBC: 382 ug/dL

## 2018-03-22 LAB — VITAMIN B12: Vitamin B-12: 532 pg/mL (ref 180–914)

## 2018-03-22 MED ORDER — BISACODYL 5 MG PO TBEC: 20.0000 mg | DELAYED_RELEASE_TABLET | Freq: Every day | ORAL | Status: DC | PRN

## 2018-03-22 MED ORDER — BISACODYL 5 MG PO TBEC
20.0000 mg | DELAYED_RELEASE_TABLET | Freq: Once | ORAL | Status: AC
  Administered 2018-03-22: 20 mg via ORAL
  Filled 2018-03-22: qty 4

## 2018-03-22 MED ORDER — METOCLOPRAMIDE HCL 5 MG/ML IJ SOLN
10.0000 mg | Freq: Once | INTRAMUSCULAR | Status: DC
  Filled 2018-03-22: qty 2

## 2018-03-22 MED ORDER — LORAZEPAM 2 MG/ML IJ SOLN: 0.5000 mg | INTRAMUSCULAR | Status: DC | PRN

## 2018-03-22 MED ORDER — SODIUM CHLORIDE 0.9 % IV SOLN
INTRAVENOUS | Status: DC
  Administered 2018-03-23: 05:00:00 via INTRAVENOUS

## 2018-03-22 MED ORDER — METOCLOPRAMIDE HCL 5 MG/ML IJ SOLN
10.0000 mg | Freq: Once | INTRAMUSCULAR | Status: AC
  Administered 2018-03-22: 10 mg via INTRAVENOUS
  Filled 2018-03-22: qty 2

## 2018-03-22 MED ORDER — POLYETHYLENE GLYCOL 3350 17 GM/SCOOP PO POWD: 0.5000 | Freq: Once | ORAL | Status: DC

## 2018-03-22 MED ORDER — POLYETHYLENE GLYCOL 3350 17 GM/SCOOP PO POWD
0.5000 | Freq: Once | ORAL | Status: AC
  Administered 2018-03-22: 127.5 g via ORAL
  Filled 2018-03-22: qty 255

## 2018-03-22 MED ORDER — PANTOPRAZOLE SODIUM 40 MG PO TBEC
40.0000 mg | DELAYED_RELEASE_TABLET | Freq: Every day | ORAL | Status: DC
  Administered 2018-03-23 – 2018-03-24 (×2): 40 mg via ORAL
  Filled 2018-03-22 (×3): qty 1

## 2018-03-22 NOTE — Consult Note (Addendum)
[facility] Consult: 6:13 PM 03/22/2018  LOS: 1 day    Referring Provider: Dr Waldron Labs  Primary Care Physician:  Venia Carbon, MD Primary Gastroenterologist:  Dr. Fuller Plan     Reason for Consultation:  Anemia, FOBT +/      IMPRESSION:   *   Symptomatic microcytic anemia. S/p 3 U PRBCs. FOBT positive without overt melena, blood per rectum.  Nausea without emesis for several weeks. On colonoscopy 3.5 years ago had several adenomatous polyps removed and notation made of nonbleeding AVMs. Suspect anemia is multifactorial but she certainly could be bleeding from AVMs especially since taking Eliquis for the last 4 months. Remote history of gastric ulcers, had mild gastritis on EGD 3.5 years ago and not taking PPI/H2 blocker at home. Weight loss.    *   Mild elevation of T bili, not sure it is of any clinical significance.  Alkaline phosphatase and LFTs are normal.  *    Bilateral pulmonary embolus 11/2017.  On chronic Eliquis, currently on hold.  Last dose AM 9/16.    *    CHF.  X-ray yesterday without active cardiac or pulmonary disease.    *   Arthritis.  Pain limits mobility.  Spinal injection on 03/08/18    PLAN:     *   Added Protonix 40 mg daily.    *  ? EGD, colonoscopy?  Pt not excited about prospect of a colonoscopy.   *   Hepatic function profile, CBC in AM.     [name], PA-C and Silvano Rusk, MD  03/22/2018, 6:13 PM Phone [place] Attending   I have taken an interval history, reviewed the chart and examined the patient. I agree with the Advanced Practitioner's note, impression and recommendations.   Heme + iron def anemia Eliquis Tx for PE =- on hold but needs to resume Hx Colon AVM's and polyps   Plan Colonoscopy, possible EGD 9/18 1000 The risks and  benefits as well as alternatives of endoscopic procedure(s) have been discussed and reviewed. All questions answered. The patient agrees to proceed.  F/U Hgb after units 3 and 4 RBC  She also is nauseous which I think is anxiety "I am very lonely" Prn lorazepam in AM after prep   Gatha Mayer, MD, [facility] 03/22/2018 6:15 PM [phone]      HPI: Chelsea Keller is a 83 y.o. female.  Past history of diastolic CHF.  Venous insufficiency.  HTN.  06/2017 bilateral, extensive PE, on Eliquis.  Psoriasis.  B12 deficiency.  Morbid obesity.  Arthritis. S/p 9/3 lumbosacral spinal injection.   Stage III CKD.  Remote gastric ulcers.   Admitted in May/2019 with jejunal diverticulitis and microperforation, PSBO.. Work up of transfusion requiring, FOBT +, microcytic anemia in absence of overt GI  Bleeding in 10/2014.   10/2014 EGD.  Mild antral gastritis.   10/2014 Colonoscopy.  Total of 7 polyps removed.  3, nonbleeding AVMs in the transverse/descending colon were not treated.  Grade 1 internal hemorrhoids.  Left-sided diverticulosis. Polyp pathology included one TVA, 2 TIAs.  2 sessile serrated polyp and 2 hyperplastic polyp.  Duodenal biopsies unremarkable, no features of inflammation, granulomas or sprue.  C/o chest heaviness, shortness of breath, dizziness.  Had not been using her Lasix because it is too difficult getting up to urinate due to hip pain and immobility.  Cardiologist, Dr. Angelena Form had her restart her Lasix and then  advised her to up the dose of Lasix to 80 mg twice daily for 3 days or come to the ER for evaluation.  She chose to present to the ED.  GI ROS in last several weeks: + for daily nausea, present regardless of PO or NPO but food is a trigger.  Does not heave or vomit.  Less frequent but brown stools, q day to q 4 days.  No abdominal pain.  Nausea better after Zofran in ED.  Weight 268 >> 250 in last several months.  No PPI or H2 blocker, ASA, NSAIDs, ETOH  at home.   Hgb 4.7 compared with 7.5 in 11/2017.  MCV 75. Currently Iron 32.  Low iron sat.  Ferritin was 6 in 11/2017.   Transfused 3 U PRBCs.  Repeat Hgb 6.7 prior to completion of all transfusions.   AKI, K 2.9.   T bili 1.9, albumin 2.7, O/w normal LFTs.      Past Medical History:  Diagnosis Date  . Allergy   . Anemia   . Anxiety   . Arthritis   . Arthritis of sacroiliac joint of both sides 11/12/2017  . Bilateral pulmonary embolism ([place]) 06/23/2017  . Chronic diastolic CHF (congestive heart failure) (HCC)    a. Echo 1/16:  mild LVH, EF normal, grade 1 DD, MAC  . Chronic venous insufficiency    chronic LE edema  . DDD (degenerative disc disease), lumbar 11/12/2017  . Degenerative joint disease (DJD) of hip, Bilateral  11/12/2017  . Depression   . Fibromyalgia    constant pain  . Hx of cardiac catheterization    a. LHC in [place] "ok" per patient with mild plaque in a single vessel - records not available  . Hx of cardiovascular stress test    a. Nuclear study in 2008 normal  . Hx of colonic polyps   . Hypertension   . Hypertriglyceridemia   . Impaired fasting glucose   . PONV (postoperative nausea and vomiting)   . PUD (peptic ulcer disease)    hx of gastric ulcer  . Pulmonary emboli ([place]) 06/2017  . Vitamin B12 deficiency     Past Surgical History:  Procedure Laterality Date  . ABDOMINAL HYSTERECTOMY    . CATARACT EXTRACTION  10/2003   OD  . CHOLECYSTECTOMY    . COLONOSCOPY W/ POLYPECTOMY    . COLONOSCOPY WITH PROPOFOL N/A 10/15/2014   Procedure: COLONOSCOPY WITH PROPOFOL;  Surgeon: Ladene Artist, MD;  Location: [facility];  Service: Endoscopy;  Laterality: N/A;  . ESOPHAGOGASTRODUODENOSCOPY (EGD) WITH PROPOFOL N/A 10/15/2014   Procedure: ESOPHAGOGASTRODUODENOSCOPY (EGD) WITH PROPOFOL;  Surgeon: Ladene Artist, MD;  Location: [facility];  Service: Endoscopy;  Laterality: N/A;  . EXTERNAL FIXATION ANKLE FRACTURE     Fx.  left ankle-fixation with pins later removed  sec to infection 1985  . EXTERNAL FIXATION WRIST FRACTURE  1985   left with pins  . EYE SURGERY    . FEMUR FRACTURE SURGERY  06/2009  . FRACTURE SURGERY    . HARDWARE REMOVAL Left 10/05/2013   Procedure: HARDWARE REMOVAL  LEFT DISTAL FEMUR;  Surgeon: Rozanna Box, MD;  Location: [place];  Service: Orthopedics;  Laterality: Left;  . HEMIARTHROPLASTY SHOULDER FRACTURE  06/2009  . JOINT REPLACEMENT    . STERIOD INJECTION Right 10/05/2013   Procedure: STEROID INJECTION;  Surgeon: Rozanna Box, MD;  Location: [place];  Service: Orthopedics;  Laterality: Right;  . TONSILLECTOMY    . TONSILLECTOMY    . TOTAL KNEE ARTHROPLASTY  03/09   left    Prior to Admission medications   Medication Sig Start Date End Date Taking? Authorizing Provider  acetaminophen (TYLENOL) 500 MG tablet Take 500 mg by mouth every 6 (six) hours as needed for headache (pain).   Yes [provider]  apixaban (ELIQUIS) 5 MG TABS tablet Take 1 tablet (5 mg total) by mouth 2 (two) times daily. 09/14/17  Yes Venia Carbon, MD  gabapentin (NEURONTIN) 300 MG capsule Take 1 capsule (300 mg total) by mouth 3 (three) times daily. Patient taking differently: Take 300 mg by mouth 2 (two) times daily.  10/25/17  Yes Viviana Simpler I, MD  potassium chloride SA (K-DUR,KLOR-CON) 20 MEQ tablet TAKE 2 TABLETS (40 MEQ TOTAL) DAILY Patient taking differently: Take 40 mEq by mouth daily.  06/10/17  Yes Venia Carbon, MD  traMADol (ULTRAM) 50 MG tablet Take 1-2 tablets (50-100 mg total) by mouth 3 (three) times daily as needed. Patient taking differently: Take 50-100 mg by mouth 3 (three) times daily as needed for moderate pain or severe pain.  02/24/18  Yes Venia Carbon, MD  triamterene-hydrochlorothiazide (MAXZIDE-25) 37.5-25 MG tablet Take 1 tablet by mouth daily.   Yes [provider]  diclofenac sodium (VOLTAREN) 1 % GEL Apply 4 g topically 4 (four) times daily. Patient not taking: Reported on 03/21/2018 02/13/18    Palumbo, April, MD  furosemide (LASIX) 80 MG tablet Take 1.5-2 tablets (120-160 mg total) by mouth daily. Patient not taking: Reported on 03/21/2018 02/03/18   Viviana Simpler I, MD  lidocaine (LIDODERM) 5 % Place 1 patch onto the skin daily. Remove & Discard patch within 12 hours or as directed by MD Patient not taking: Reported on 03/21/2018 02/13/18   Palumbo, April, MD  magic mouthwash w/lidocaine SOLN Take 5 mLs by mouth 4 (four) times daily as needed for mouth pain. Rinse and spit out Patient not taking: Reported on 03/21/2018 02/21/18   Venia Carbon, MD  nystatin (MYCOSTATIN) 100000 UNIT/ML suspension Take 5 mLs (500,000 Units total) by mouth 4 (four) times daily. Patient not taking: Reported on 03/21/2018 03/02/18   Venia Carbon, MD  predniSONE (DELTASONE) 20 MG tablet 3 tabs po day one, then 2 po daily x 4 days Patient not taking: Reported on 03/21/2018 02/13/18   Palumbo, April, MD    Scheduled Meds: . sodium chloride   Intravenous Once  . metoCLOPramide (REGLAN) injection  10 mg Intravenous Once  . nystatin  5 mL Oral QID  . pantoprazole  40 mg Oral Q0600  . polyethylene glycol powder  0.5 Container Oral Once  . polyethylene glycol powder  0.5 Container Oral Once  . potassium chloride SA  40 mEq Oral Daily   Infusions:  PRN Meds: acetaminophen, [START ON 03/23/2018] LORazepam, ondansetron **OR** ondansetron (ZOFRAN) IV   Allergies as of 03/21/2018 - Review Complete 03/21/2018  Allergen Reaction Noted  . Codeine sulfate Shortness Of Breath   . Celecoxib Other (See Comments)   . 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 Depression Mother   . Cancer Mother        uterine cancer  . Heart attack Father   . Cancer Brother        prostate cancer  . Diabetes Maternal Aunt   . Arthritis Brother   . Asthma Brother   . Stroke Maternal Grandmother   . Pulmonary embolism Daughter       REVIEW OF SYSTEMS: Constitutional:  Weakness, fatigue ENT:  No nose bleeds Pulm:  SOB, DOE.  No cough CV:  No palpitations, no LE edema. Chest heaviness GU:  No hematuria, no frequency GI:  Per HPI.  No dysphagia.   Heme:  Bruises easily, no excessive bleeding   Transfusions:  Per HPI Neuro:  + lightheadedness.  No headaches, no peripheral tingling or numbness Derm:  pruititic eruptions on breast, back, arms.   Endocrine:  No sweats or chills.  No polyuria or dysuria Immunization:  Not queried Travel:  None beyond local counties in last few months.    PHYSICAL EXAM: Vital signs in last 24 hours: Vitals:   03/22/18 1430 03/22/18 1648  BP: 140/62 (!) 132/49  Pulse: 68 72  Resp: 20 18  Temp: 99 F (37.2 C)   SpO2:  98%   Wt Readings from Last 3 Encounters:  03/22/18 114.2 kg  02/03/18 120.2 kg  12/29/17 116.1 kg    General: Pleasant, morbidly obese, elderly WF who is sitting up in chair at the sink Head: No facial asymmetry or swelling.  No signs of head trauma. Eyes: No scleral icterus.  Pale conjunctiva.  EOMI. Ears: Not hard of hearing. Nose: No congestion or discharge. Mouth: Oral mucosa pink, moist, clear.  Tongue midline.  Fair dentition. Neck: No JVD, masses, thyromegaly. Lungs: Clear bilaterally.  No dyspnea or cough. Heart: RRR.  No MRG.  S1, S2 present. Abdomen: Obese.  Not tender.  Not distended.  Soft.  Bowel sounds active.  No HSM, masses, bruits..   Rectal: Deferred Musc/Skeltl: No joint redness or swelling. Extremities:+ bil lower extremity edema. Neurologic: Alert.  Good historian.  Oriented x3.  Moves all 4 limbs, strength not  tested.  No tremors or gross deficits. Skin: Small scabbed lesions, a couple on her breast, one on the upper chest and a couple on her back.  Purpura on her arms. Tattoos: None Nodes: No cervical adenopathy. Psych: Calm, cooperative, pleasant.  Fluid speech.  Intake/Output from previous day: 09/16 0701 - 09/17 0700 In: 315 [Blood:315] Out: 850 [Urine:850] Intake/Output this shift: Total I/O In: 320 [P.O.:320] Out: -   LAB RESULTS: Recent Labs    03/21/18 1855 03/22/18 0248 03/22/18 1656  WBC 12.4* 12.6* 12.3*  HGB 4.7* 6.4* 8.4*  HCT 18.7* 23.3* 28.5*  PLT 405* 340 329   BMET Lab Results  Component Value Date   NA 139 03/22/2018   NA 137 03/21/2018   NA 138 11/12/2017   K 2.9 (L) 03/22/2018   K 3.0 (L) 03/21/2018   K 4.4 11/12/2017   CL 98 03/22/2018  CL 97 (L) 03/21/2018   CL 103 11/12/2017   CO2 25 03/22/2018   CO2 25 03/21/2018   CO2 25 11/12/2017   GLUCOSE 109 (H) 03/22/2018   GLUCOSE 104 (H) 03/21/2018   GLUCOSE 125 (H) 11/12/2017   BUN 21 03/22/2018   BUN 20 03/21/2018   BUN 18 11/12/2017   CREATININE 1.24 (H) 03/22/2018   CREATININE 1.19 (H) 03/21/2018   CREATININE 0.86 11/12/2017   CALCIUM 8.7 (L) 03/22/2018   CALCIUM 9.0 03/21/2018   CALCIUM 9.2 11/12/2017   LFT Recent Labs    03/22/18 0248  PROT 4.9*  ALBUMIN 2.7*  AST 16  ALT 10  ALKPHOS 65  BILITOT 1.9*     RADIOLOGY STUDIES: Dg Chest 2 View  Result Date: 03/21/2018 CLINICAL DATA:  Bilateral chest pain EXAM: CHEST - 2 VIEW COMPARISON:  11/06/2017 FINDINGS: Stable mild cardiomegaly. Nonaneurysmal atherosclerotic aorta. No acute pulmonary consolidation, mass or edema. No effusion or pneumothorax. Osteoarthritis of the right AC joint with subchondral areas of sclerosis involving the right humeral head suspicious for changes of AVN, unchanged in appearance. IMPRESSION: No active pulmonary disease. Aortic atherosclerosis without aneurysm. Probable AVN of the right humeral head.  Electronically Signed   By: Ashley Royalty M.D.   On: 03/21/2018 19:38

## 2018-03-22 NOTE — Progress Notes (Signed)
PROGRESS NOTE                                                                                                                                                                                                             Patient Demographics:    Chelsea Keller, is a 83 y.o. female, DOB - 1934/07/13, NAT:[id number]  Admit date - 03/21/2018   Admitting Physician Elwyn Reach, MD  Outpatient Primary MD for the patient is Venia Carbon, MD  LOS - 1   Chief Complaint  Patient presents with  . Chest Pain  . Shortness of Breath       Brief Narrative    83 y.o. female with medical history significant of psoriasis, pulmonary embolism on Eliquis, diverticular disease, osteoarthritis, morbid obesity, B12 deficiency who presented to the ER with symptomatic anemia.  Work-up was significant for hemoglobin of 4.7, she had Hemoccult positive stool, but no evidence of overt GI bleed.  She was transfused 4 units PRBC, and seen by GI for further evaluation, Eliquis on hold since admission.   Subjective:    [name] today has, No headache, No chest pain, No abdominal pain - No Nausea, denies any shortness of breath as well, reports she is feeling better  Assessment  & Plan :    Principal Problem:   Anemia Active Problems:   Essential hypertension   Venous (peripheral) insufficiency   Vitamin B12 deficiency   Symptomatic anemia   symptomatic microcytic anemia -Is most likely in the setting of GI bleed, possibly slow GI bleed, in the setting of anticoagulation use, with known history of gastritis and AVM in the past. -Being 4.7 on admission, she received 4 units PRBC, awaiting repeat hemoglobin level to determine on any further transfusion requirements.  GI bleed -GI consult greatly appreciated, patient Hemoccult positive, without overt melena or blood per rectum, colonoscopy 3 years ago significant for polyps and nonbleeding AVM, endoscopy 3 years  ago significant for some mild gastritis, but known history of gastric ulcer in the past as well, now continue with Protonix 40 mg, await further recommendation from GI.  history of PE -Continue to hold Eliquis for now in the setting of profound anemia with presumed GI bleed .  hypertension:  - Continue home regimen and monitor  morbid obesity:  - Counseling provided.  chronic kidney  disease stage III:  - Appears stable.  At baseline.   history of vitamin B12 deficiency:  -B12 within normal level     Code Status : Full  Family Communication  : none at bedside  Disposition Plan  : home when stable  Consults  :  GI  Procedures  : None  DVT Prophylaxis  : Eliquis on hold  Lab Results  Component Value Date   PLT 340 03/22/2018    Antibiotics  :    Anti-infectives (From admission, onward)   None        Objective:   Vitals:   03/22/18 1043 03/22/18 1100 03/22/18 1146 03/22/18 1430  BP: (!) 143/69 (!) 120/57 (!) 132/56 140/62  Pulse: 76 78 74 68  Resp: (!) 22 20 18 20   Temp: 98.1 F (36.7 C) 98.2 F (36.8 C) 98.4 F (36.9 C) 99 F (37.2 C)  TempSrc: Oral Oral Oral Oral  SpO2: 97%  100%   Weight:      Height:        Wt Readings from Last 3 Encounters:  03/22/18 114.2 kg  02/03/18 120.2 kg  12/29/17 116.1 kg     Intake/Output Summary (Last 24 hours) at 03/22/2018 1624 Last data filed at 03/22/2018 1300 Gross per 24 hour  Intake 635 ml  Output 850 ml  Net -215 ml     Physical Exam  Awake Alert, Oriented X 3, No new F.N deficits, Normal affect Symmetrical Chest wall movement, Good air movement bilaterally, CTAB RRR,No Gallops,Rubs or new Murmurs, No Parasternal Heave +ve B.Sounds, Abd Soft, No tenderness, No organomegaly appriciated, No rebound - guarding or rigidity. No Cyanosis, Clubbing or edema, No new Rash or bruise      Data Review:    CBC Recent Labs  Lab 03/21/18 1855 03/22/18 0248  WBC 12.4* 12.6*  HGB 4.7* 6.4*  HCT  18.7* 23.3*  PLT 405* 340  MCV 71.4* 75.9*  MCH 17.9* 20.8*  MCHC 25.1* 27.5*  RDW 20.3* 22.8*    Chemistries  Recent Labs  Lab 03/21/18 1855 03/22/18 0248  NA 137 139  K 3.0* 2.9*  CL 97* 98  CO2 25 25  GLUCOSE 104* 109*  BUN 20 21  CREATININE 1.19* 1.24*  CALCIUM 9.0 8.7*  AST  --  16  ALT  --  10  ALKPHOS  --  65  BILITOT  --  1.9*   ------------------------------------------------------------------------------------------------------------------ No results for input(s): CHOL, HDL, LDLCALC, TRIG, CHOLHDL, LDLDIRECT in the last 72 hours.  Lab Results  Component Value Date   HGBA1C 5.6 06/23/2017   ------------------------------------------------------------------------------------------------------------------ No results for input(s): TSH, T4TOTAL, T3FREE, THYROIDAB in the last 72 hours.  Invalid input(s): FREET3 ------------------------------------------------------------------------------------------------------------------ Recent Labs    03/22/18 0301  VITAMINB12 532  TIBC 414  IRON 32    Coagulation profile No results for input(s): INR, PROTIME in the last 168 hours.  No results for input(s): DDIMER in the last 72 hours.  Cardiac Enzymes No results for input(s): CKMB, TROPONINI, MYOGLOBIN in the last 168 hours.  Invalid input(s): CK ------------------------------------------------------------------------------------------------------------------    Component Value Date/Time   BNP 120.9 (H) 11/06/2017 1436    Inpatient Medications  Scheduled Meds: . sodium chloride   Intravenous Once  . nystatin  5 mL Oral QID  . pantoprazole  40 mg Oral Q0600  . potassium chloride SA  40 mEq Oral Daily   Continuous Infusions: PRN Meds:.acetaminophen, ondansetron **OR** ondansetron (ZOFRAN) IV  Micro Results No results found for  this or any previous visit (from the past 240 hour(s)).  Radiology Reports Dg Chest 2 View  Result Date: 03/21/2018 CLINICAL  DATA:  Bilateral chest pain EXAM: CHEST - 2 VIEW COMPARISON:  11/06/2017 FINDINGS: Stable mild cardiomegaly. Nonaneurysmal atherosclerotic aorta. No acute pulmonary consolidation, mass or edema. No effusion or pneumothorax. Osteoarthritis of the right AC joint with subchondral areas of sclerosis involving the right humeral head suspicious for changes of AVN, unchanged in appearance. IMPRESSION: No active pulmonary disease. Aortic atherosclerosis without aneurysm. Probable AVN of the right humeral head. Electronically Signed   By: Ashley Royalty M.D.   On: 03/21/2018 19:38   Dg Fluoro Guide Spinal/si Jt Inj Left  Result Date: 03/08/2018 CLINICAL DATA:  Lumbosacral spondylosis without myelopathy. Pain over the left SI joint. EXAM: SPINAL INJECTION UNDER FLUOROSCOPY (LEFT) FLUOROSCOPY TIME:  39 seconds.  10.2 mGy. PROCEDURE: The procedure, risks, benefits, and alternatives were explained to the patient. Questions regarding the procedure were encouraged and answered. The patient understands and consents to the procedure. LEFT SACROILIAC JOINT INJECTION: The skin dorsal to the sacroiliac joint was cleansed and anesthetized. A 22 gauge curved needle was directed into the inferior aspect of the sacroiliac joint. Intra articular positioning was confirmed by injecting a few cc of Omnipaque. One hundred twenty mg Depo-Medrol mixed with 1.5 cc 1% lidocaine were injected into the joint. COMPLICATIONS: None IMPRESSION: Technically successful left sacroiliac joint injection. Electronically Signed   By: Marybelle Killings M.D.   On: 03/08/2018 15:30      Phillips Climes M.D on 03/22/2018 at 4:24 PM  Between 7am to 7pm - Pager - [phone]  After 7pm go to [email or web address] - password [facility]  [facility] -  Office  [phone]

## 2018-03-22 NOTE — Progress Notes (Signed)
Miralax to be mixed with Gatorade. None found at this time after calling around. Patient ok with juice or water.

## 2018-03-22 NOTE — H&P (View-Only) (Signed)
[facility] Consult: 6:13 PM 03/22/2018  LOS: 1 day    Referring Provider: Dr Waldron Labs  Primary Care Physician:  Venia Carbon, MD Primary Gastroenterologist:  Dr. Fuller Plan     Reason for Consultation:  Anemia, FOBT +/      IMPRESSION:   *   Symptomatic microcytic anemia. S/p 3 U PRBCs. FOBT positive without overt melena, blood per rectum.  Nausea without emesis for several weeks. On colonoscopy 3.5 years ago had several adenomatous polyps removed and notation made of nonbleeding AVMs. Suspect anemia is multifactorial but she certainly could be bleeding from AVMs especially since taking Eliquis for the last 4 months. Remote history of gastric ulcers, had mild gastritis on EGD 3.5 years ago and not taking PPI/H2 blocker at home. Weight loss.    *   Mild elevation of T bili, not sure it is of any clinical significance.  Alkaline phosphatase and LFTs are normal.  *    Bilateral pulmonary embolus 11/2017.  On chronic Eliquis, currently on hold.  Last dose AM 9/16.    *    CHF.  X-ray yesterday without active cardiac or pulmonary disease.    *   Arthritis.  Pain limits mobility.  Spinal injection on 03/08/18    PLAN:     *   Added Protonix 40 mg daily.    *  ? EGD, colonoscopy?  Pt not excited about prospect of a colonoscopy.   *   Hepatic function profile, CBC in AM.     [name], PA-C and Silvano Rusk, MD  03/22/2018, 6:13 PM Phone [place] Attending   I have taken an interval history, reviewed the chart and examined the patient. I agree with the Advanced Practitioner's note, impression and recommendations.   Heme + iron def anemia Eliquis Tx for PE =- on hold but needs to resume Hx Colon AVM's and polyps   Plan Colonoscopy, possible EGD 9/18 1000 The risks and  benefits as well as alternatives of endoscopic procedure(s) have been discussed and reviewed. All questions answered. The patient agrees to proceed.  F/U Hgb after units 3 and 4 RBC  She also is nauseous which I think is anxiety "I am very lonely" Prn lorazepam in AM after prep   Gatha Mayer, MD, [facility] 03/22/2018 6:15 PM [phone]      HPI: Chelsea Keller is a 83 y.o. female.  Past history of diastolic CHF.  Venous insufficiency.  HTN.  06/2017 bilateral, extensive PE, on Eliquis.  Psoriasis.  B12 deficiency.  Morbid obesity.  Arthritis. S/p 9/3 lumbosacral spinal injection.   Stage III CKD.  Remote gastric ulcers.   Admitted in May/2019 with jejunal diverticulitis and microperforation, PSBO.. Work up of transfusion requiring, FOBT +, microcytic anemia in absence of overt GI  Bleeding in 10/2014.   10/2014 EGD.  Mild antral gastritis.   10/2014 Colonoscopy.  Total of 7 polyps removed.  3, nonbleeding AVMs in the transverse/descending colon were not treated.  Grade 1 internal hemorrhoids.  Left-sided diverticulosis. Polyp pathology included one TVA, 2 TIAs.  2 sessile serrated polyp and 2 hyperplastic polyp.  Duodenal biopsies unremarkable, no features of inflammation, granulomas or sprue.  C/o chest heaviness, shortness of breath, dizziness.  Had not been using her Lasix because it is too difficult getting up to urinate due to hip pain and immobility.  Cardiologist, Dr. Angelena Form had her restart her Lasix and then  advised her to up the dose of Lasix to 80 mg twice daily for 3 days or come to the ER for evaluation.  She chose to present to the ED.  GI ROS in last several weeks: + for daily nausea, present regardless of PO or NPO but food is a trigger.  Does not heave or vomit.  Less frequent but brown stools, q day to q 4 days.  No abdominal pain.  Nausea better after Zofran in ED.  Weight 268 >> 250 in last several months.  No PPI or H2 blocker, ASA, NSAIDs, ETOH  at home.   Hgb 4.7 compared with 7.5 in 11/2017.  MCV 75. Currently Iron 32.  Low iron sat.  Ferritin was 6 in 11/2017.   Transfused 3 U PRBCs.  Repeat Hgb 6.7 prior to completion of all transfusions.   AKI, K 2.9.   T bili 1.9, albumin 2.7, O/w normal LFTs.      Past Medical History:  Diagnosis Date  . Allergy   . Anemia   . Anxiety   . Arthritis   . Arthritis of sacroiliac joint of both sides 11/12/2017  . Bilateral pulmonary embolism ([place]) 06/23/2017  . Chronic diastolic CHF (congestive heart failure) (HCC)    a. Echo 1/16:  mild LVH, EF normal, grade 1 DD, MAC  . Chronic venous insufficiency    chronic LE edema  . DDD (degenerative disc disease), lumbar 11/12/2017  . Degenerative joint disease (DJD) of hip, Bilateral  11/12/2017  . Depression   . Fibromyalgia    constant pain  . Hx of cardiac catheterization    a. LHC in [place] "ok" per patient with mild plaque in a single vessel - records not available  . Hx of cardiovascular stress test    a. Nuclear study in 2008 normal  . Hx of colonic polyps   . Hypertension   . Hypertriglyceridemia   . Impaired fasting glucose   . PONV (postoperative nausea and vomiting)   . PUD (peptic ulcer disease)    hx of gastric ulcer  . Pulmonary emboli ([place]) 06/2017  . Vitamin B12 deficiency     Past Surgical History:  Procedure Laterality Date  . ABDOMINAL HYSTERECTOMY    . CATARACT EXTRACTION  10/2003   OD  . CHOLECYSTECTOMY    . COLONOSCOPY W/ POLYPECTOMY    . COLONOSCOPY WITH PROPOFOL N/A 10/15/2014   Procedure: COLONOSCOPY WITH PROPOFOL;  Surgeon: Ladene Artist, MD;  Location: [facility];  Service: Endoscopy;  Laterality: N/A;  . ESOPHAGOGASTRODUODENOSCOPY (EGD) WITH PROPOFOL N/A 10/15/2014   Procedure: ESOPHAGOGASTRODUODENOSCOPY (EGD) WITH PROPOFOL;  Surgeon: Ladene Artist, MD;  Location: [facility];  Service: Endoscopy;  Laterality: N/A;  . EXTERNAL FIXATION ANKLE FRACTURE     Fx.  left ankle-fixation with pins later removed  sec to infection 1985  . EXTERNAL FIXATION WRIST FRACTURE  1985   left with pins  . EYE SURGERY    . FEMUR FRACTURE SURGERY  06/2009  . FRACTURE SURGERY    . HARDWARE REMOVAL Left 10/05/2013   Procedure: HARDWARE REMOVAL  LEFT DISTAL FEMUR;  Surgeon: Rozanna Box, MD;  Location: [place];  Service: Orthopedics;  Laterality: Left;  . HEMIARTHROPLASTY SHOULDER FRACTURE  06/2009  . JOINT REPLACEMENT    . STERIOD INJECTION Right 10/05/2013   Procedure: STEROID INJECTION;  Surgeon: Rozanna Box, MD;  Location: [place];  Service: Orthopedics;  Laterality: Right;  . TONSILLECTOMY    . TONSILLECTOMY    . TOTAL KNEE ARTHROPLASTY  03/09   left    Prior to Admission medications   Medication Sig Start Date End Date Taking? Authorizing Provider  acetaminophen (TYLENOL) 500 MG tablet Take 500 mg by mouth every 6 (six) hours as needed for headache (pain).   Yes [provider]  apixaban (ELIQUIS) 5 MG TABS tablet Take 1 tablet (5 mg total) by mouth 2 (two) times daily. 09/14/17  Yes Venia Carbon, MD  gabapentin (NEURONTIN) 300 MG capsule Take 1 capsule (300 mg total) by mouth 3 (three) times daily. Patient taking differently: Take 300 mg by mouth 2 (two) times daily.  10/25/17  Yes Viviana Simpler I, MD  potassium chloride SA (K-DUR,KLOR-CON) 20 MEQ tablet TAKE 2 TABLETS (40 MEQ TOTAL) DAILY Patient taking differently: Take 40 mEq by mouth daily.  06/10/17  Yes Venia Carbon, MD  traMADol (ULTRAM) 50 MG tablet Take 1-2 tablets (50-100 mg total) by mouth 3 (three) times daily as needed. Patient taking differently: Take 50-100 mg by mouth 3 (three) times daily as needed for moderate pain or severe pain.  02/24/18  Yes Venia Carbon, MD  triamterene-hydrochlorothiazide (MAXZIDE-25) 37.5-25 MG tablet Take 1 tablet by mouth daily.   Yes [provider]  diclofenac sodium (VOLTAREN) 1 % GEL Apply 4 g topically 4 (four) times daily. Patient not taking: Reported on 03/21/2018 02/13/18    Palumbo, April, MD  furosemide (LASIX) 80 MG tablet Take 1.5-2 tablets (120-160 mg total) by mouth daily. Patient not taking: Reported on 03/21/2018 02/03/18   Viviana Simpler I, MD  lidocaine (LIDODERM) 5 % Place 1 patch onto the skin daily. Remove & Discard patch within 12 hours or as directed by MD Patient not taking: Reported on 03/21/2018 02/13/18   Palumbo, April, MD  magic mouthwash w/lidocaine SOLN Take 5 mLs by mouth 4 (four) times daily as needed for mouth pain. Rinse and spit out Patient not taking: Reported on 03/21/2018 02/21/18   Venia Carbon, MD  nystatin (MYCOSTATIN) 100000 UNIT/ML suspension Take 5 mLs (500,000 Units total) by mouth 4 (four) times daily. Patient not taking: Reported on 03/21/2018 03/02/18   Venia Carbon, MD  predniSONE (DELTASONE) 20 MG tablet 3 tabs po day one, then 2 po daily x 4 days Patient not taking: Reported on 03/21/2018 02/13/18   Palumbo, April, MD    Scheduled Meds: . sodium chloride   Intravenous Once  . metoCLOPramide (REGLAN) injection  10 mg Intravenous Once  . nystatin  5 mL Oral QID  . pantoprazole  40 mg Oral Q0600  . polyethylene glycol powder  0.5 Container Oral Once  . polyethylene glycol powder  0.5 Container Oral Once  . potassium chloride SA  40 mEq Oral Daily   Infusions:  PRN Meds: acetaminophen, [START ON 03/23/2018] LORazepam, ondansetron **OR** ondansetron (ZOFRAN) IV   Allergies as of 03/21/2018 - Review Complete 03/21/2018  Allergen Reaction Noted  . Codeine sulfate Shortness Of Breath   . Celecoxib Other (See Comments)   . 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 Depression Mother   . Cancer Mother        uterine cancer  . Heart attack Father   . Cancer Brother        prostate cancer  . Diabetes Maternal Aunt   . Arthritis Brother   . Asthma Brother   . Stroke Maternal Grandmother   . Pulmonary embolism Daughter       REVIEW OF SYSTEMS: Constitutional:  Weakness, fatigue ENT:  No nose bleeds Pulm:  SOB, DOE.  No cough CV:  No palpitations, no LE edema. Chest heaviness GU:  No hematuria, no frequency GI:  Per HPI.  No dysphagia.   Heme:  Bruises easily, no excessive bleeding   Transfusions:  Per HPI Neuro:  + lightheadedness.  No headaches, no peripheral tingling or numbness Derm:  pruititic eruptions on breast, back, arms.   Endocrine:  No sweats or chills.  No polyuria or dysuria Immunization:  Not queried Travel:  None beyond local counties in last few months.    PHYSICAL EXAM: Vital signs in last 24 hours: Vitals:   03/22/18 1430 03/22/18 1648  BP: 140/62 (!) 132/49  Pulse: 68 72  Resp: 20 18  Temp: 99 F (37.2 C)   SpO2:  98%   Wt Readings from Last 3 Encounters:  03/22/18 114.2 kg  02/03/18 120.2 kg  12/29/17 116.1 kg    General: Pleasant, morbidly obese, elderly WF who is sitting up in chair at the sink Head: No facial asymmetry or swelling.  No signs of head trauma. Eyes: No scleral icterus.  Pale conjunctiva.  EOMI. Ears: Not hard of hearing. Nose: No congestion or discharge. Mouth: Oral mucosa pink, moist, clear.  Tongue midline.  Fair dentition. Neck: No JVD, masses, thyromegaly. Lungs: Clear bilaterally.  No dyspnea or cough. Heart: RRR.  No MRG.  S1, S2 present. Abdomen: Obese.  Not tender.  Not distended.  Soft.  Bowel sounds active.  No HSM, masses, bruits..   Rectal: Deferred Musc/Skeltl: No joint redness or swelling. Extremities:+ bil lower extremity edema. Neurologic: Alert.  Good historian.  Oriented x3.  Moves all 4 limbs, strength not  tested.  No tremors or gross deficits. Skin: Small scabbed lesions, a couple on her breast, one on the upper chest and a couple on her back.  Purpura on her arms. Tattoos: None Nodes: No cervical adenopathy. Psych: Calm, cooperative, pleasant.  Fluid speech.  Intake/Output from previous day: 09/16 0701 - 09/17 0700 In: 315 [Blood:315] Out: 850 [Urine:850] Intake/Output this shift: Total I/O In: 320 [P.O.:320] Out: -   LAB RESULTS: Recent Labs    03/21/18 1855 03/22/18 0248 03/22/18 1656  WBC 12.4* 12.6* 12.3*  HGB 4.7* 6.4* 8.4*  HCT 18.7* 23.3* 28.5*  PLT 405* 340 329   BMET Lab Results  Component Value Date   NA 139 03/22/2018   NA 137 03/21/2018   NA 138 11/12/2017   K 2.9 (L) 03/22/2018   K 3.0 (L) 03/21/2018   K 4.4 11/12/2017   CL 98 03/22/2018  CL 97 (L) 03/21/2018   CL 103 11/12/2017   CO2 25 03/22/2018   CO2 25 03/21/2018   CO2 25 11/12/2017   GLUCOSE 109 (H) 03/22/2018   GLUCOSE 104 (H) 03/21/2018   GLUCOSE 125 (H) 11/12/2017   BUN 21 03/22/2018   BUN 20 03/21/2018   BUN 18 11/12/2017   CREATININE 1.24 (H) 03/22/2018   CREATININE 1.19 (H) 03/21/2018   CREATININE 0.86 11/12/2017   CALCIUM 8.7 (L) 03/22/2018   CALCIUM 9.0 03/21/2018   CALCIUM 9.2 11/12/2017   LFT Recent Labs    03/22/18 0248  PROT 4.9*  ALBUMIN 2.7*  AST 16  ALT 10  ALKPHOS 65  BILITOT 1.9*     RADIOLOGY STUDIES: Dg Chest 2 View  Result Date: 03/21/2018 CLINICAL DATA:  Bilateral chest pain EXAM: CHEST - 2 VIEW COMPARISON:  11/06/2017 FINDINGS: Stable mild cardiomegaly. Nonaneurysmal atherosclerotic aorta. No acute pulmonary consolidation, mass or edema. No effusion or pneumothorax. Osteoarthritis of the right AC joint with subchondral areas of sclerosis involving the right humeral head suspicious for changes of AVN, unchanged in appearance. IMPRESSION: No active pulmonary disease. Aortic atherosclerosis without aneurysm. Probable AVN of the right humeral head.  Electronically Signed   By: Ashley Royalty M.D.   On: 03/21/2018 19:38

## 2018-03-22 NOTE — Anesthesia Preprocedure Evaluation (Addendum)
Anesthesia Evaluation  Patient identified by MRN, date of birth, ID band Patient awake    Reviewed: Allergy & Precautions, NPO status , Patient's Chart, lab work & pertinent test results  History of Anesthesia Complications (+) PONV  Airway Mallampati: II  TM Distance: >3 FB Neck ROM: Full    Dental no notable dental hx. (+) Upper Dentures, Lower Dentures   Pulmonary former smoker,    Pulmonary exam normal breath sounds clear to auscultation       Cardiovascular Exercise Tolerance: Good hypertension, Pt. on medications + Peripheral Vascular Disease  Normal cardiovascular exam Rhythm:Regular Rate:Normal  06/23/2017 ECHO Left ventricle: The cavity size was normal. Systolic function was   normal. The estimated ejection fraction was in the range of 55%   to 60%. Wall motion was normal; there were no regional wall   motion abnormalities. Doppler parameters are consistent with   abnormal left ventricular relaxation (grade 1 diastolic   dysfunction). - Aortic valve: There was trivial regurgitation. - Mitral valve: Calcified annulus. Mildly thickened leaflets . - Atrial septum: No defect or patent foramen ovale was identified. - Pulmonary arteries: PA peak pressure: 32 mm Hg (S).   Neuro/Psych  Neuromuscular disease    GI/Hepatic Neg liver ROS, PUD,   Endo/Other    Renal/GU Renal disease     Musculoskeletal  (+) Arthritis , Fibromyalgia -  Abdominal   Peds  Hematology  (+) anemia ,   Anesthesia Other Findings   Reproductive/Obstetrics                            Anesthesia Physical Anesthesia Plan  ASA: III  Anesthesia Plan: MAC   Post-op Pain Management:    Induction: Intravenous  PONV Risk Score and Plan: Treatment may vary due to age or medical condition  Airway Management Planned: Natural Airway and Nasal Cannula  Additional Equipment:   Intra-op Plan:   Post-operative  Plan:   Informed Consent: I have reviewed the patients History and Physical, chart, labs and discussed the procedure including the risks, benefits and alternatives for the proposed anesthesia with the patient or authorized representative who has indicated his/her understanding and acceptance.   Dental advisory given  Plan Discussed with:   Anesthesia Plan Comments:         Anesthesia Quick Evaluation

## 2018-03-23 ENCOUNTER — Encounter (HOSPITAL_COMMUNITY): Payer: Self-pay | Admitting: Anesthesiology

## 2018-03-23 ENCOUNTER — Inpatient Hospital Stay (HOSPITAL_COMMUNITY): Payer: Medicare Other | Admitting: Anesthesiology

## 2018-03-23 ENCOUNTER — Encounter (HOSPITAL_COMMUNITY)
Admission: EM | Disposition: A | Discharge status: Home Health | Payer: Self-pay | Source: Home / Self Care | Attending: Family Medicine

## 2018-03-23 DIAGNOSIS — K5730 Diverticulosis of large intestine without perforation or abscess without bleeding: Secondary | ICD-10-CM

## 2018-03-23 DIAGNOSIS — D500 Iron deficiency anemia secondary to blood loss (chronic): Secondary | ICD-10-CM

## 2018-03-23 DIAGNOSIS — K648 Other hemorrhoids: Secondary | ICD-10-CM

## 2018-03-23 HISTORY — PX: HOT HEMOSTASIS: SHX5433

## 2018-03-23 HISTORY — PX: COLONOSCOPY WITH PROPOFOL: SHX5780

## 2018-03-23 LAB — HEPATIC FUNCTION PANEL
ALBUMIN: 2.8 g/dL — AB (ref 3.5–5.0)
ALT: 11 U/L (ref 0–44)
AST: 14 U/L — AB (ref 15–41)
Alkaline Phosphatase: 74 U/L (ref 38–126)
BILIRUBIN DIRECT: 0.5 mg/dL — AB (ref 0.0–0.2)
Indirect Bilirubin: 1.8 mg/dL — ABNORMAL HIGH (ref 0.3–0.9)
TOTAL PROTEIN: 5.1 g/dL — AB (ref 6.5–8.1)
Total Bilirubin: 2.3 mg/dL — ABNORMAL HIGH (ref 0.3–1.2)

## 2018-03-23 LAB — BPAM RBC
Blood Product Expiration Date: 201910082359
Blood Product Expiration Date: 201910092359
Blood Product Expiration Date: 201910092359
Blood Product Expiration Date: 201910122359
Blood Product Expiration Date: 201910122359
ISSUE DATE / TIME: 201909162146
ISSUE DATE / TIME: 201909170012
ISSUE DATE / TIME: 201909170520
ISSUE DATE / TIME: 201909171051
Unit Type and Rh: 7300
Unit Type and Rh: 7300
Unit Type and Rh: 7300
Unit Type and Rh: 7300
Unit Type and Rh: 7300

## 2018-03-23 LAB — TYPE AND SCREEN
ABO/RH(D): B POS
Antibody Screen: NEGATIVE
Unit division: 0
Unit division: 0
Unit division: 0
Unit division: 0
Unit division: 0

## 2018-03-23 LAB — BASIC METABOLIC PANEL
ANION GAP: 11 (ref 5–15)
BUN: 14 mg/dL (ref 8–23)
CHLORIDE: 98 mmol/L (ref 98–111)
CO2: 30 mmol/L (ref 22–32)
Calcium: 8.5 mg/dL — ABNORMAL LOW (ref 8.9–10.3)
Creatinine, Ser: 1.02 mg/dL — ABNORMAL HIGH (ref 0.44–1.00)
GFR calc Af Amer: 58 mL/min — ABNORMAL LOW (ref >60)
GFR calc non Af Amer: 50 mL/min — ABNORMAL LOW (ref >60)
Glucose, Bld: 92 mg/dL (ref 70–99)
POTASSIUM: 2.9 mmol/L — AB (ref 3.5–5.1)
Sodium: 139 mmol/L (ref 135–145)

## 2018-03-23 LAB — FOLATE RBC
FOLATE, RBC: 2154 ng/mL (ref >498)
Folate, Hemolysate: 482.4 ng/mL
HEMATOCRIT: 22.4 % — AB (ref 34.0–46.6)

## 2018-03-23 LAB — CBC
HCT: 29.3 % — ABNORMAL LOW (ref 36.0–46.0)
HEMOGLOBIN: 8.7 g/dL — AB (ref 12.0–15.0)
MCH: 22.8 pg — ABNORMAL LOW (ref 26.0–34.0)
MCHC: 29.7 g/dL — ABNORMAL LOW (ref 30.0–36.0)
MCV: 76.9 fL — ABNORMAL LOW (ref 78.0–100.0)
PLATELETS: 299 10*3/uL (ref 150–400)
RBC: 3.81 MIL/uL — AB (ref 3.87–5.11)
RDW: 21.8 % — ABNORMAL HIGH (ref 11.5–15.5)
WBC: 11.4 10*3/uL — ABNORMAL HIGH (ref 4.0–10.5)

## 2018-03-23 SURGERY — COLONOSCOPY WITH PROPOFOL
Anesthesia: Monitor Anesthesia Care

## 2018-03-23 MED ORDER — PROPOFOL 10 MG/ML IV BOLUS
INTRAVENOUS | Status: DC | PRN
  Administered 2018-03-23: 20 mg via INTRAVENOUS

## 2018-03-23 MED ORDER — LACTATED RINGERS IV SOLN
INTRAVENOUS | Status: DC
  Administered 2018-03-23 (×2): via INTRAVENOUS

## 2018-03-23 MED ORDER — PHENYLEPHRINE 40 MCG/ML (10ML) SYRINGE FOR IV PUSH (FOR BLOOD PRESSURE SUPPORT)
PREFILLED_SYRINGE | INTRAVENOUS | Status: DC | PRN
  Administered 2018-03-23: 40 ug via INTRAVENOUS

## 2018-03-23 MED ORDER — SODIUM CHLORIDE 0.9 % IV SOLN
510.0000 mg | Freq: Once | INTRAVENOUS | Status: AC
  Administered 2018-03-23: 510 mg via INTRAVENOUS
  Filled 2018-03-23: qty 17

## 2018-03-23 MED ORDER — PROPOFOL 500 MG/50ML IV EMUL
INTRAVENOUS | Status: DC | PRN
  Administered 2018-03-23: 100 ug/kg/min via INTRAVENOUS

## 2018-03-23 SURGICAL SUPPLY — 25 items

## 2018-03-23 NOTE — Anesthesia Procedure Notes (Signed)
Procedure Name: MAC Date/Time: 03/23/2018 10:22 AM Performed by: Kyung Rudd, CRNA Pre-anesthesia Checklist: Patient identified, Emergency Drugs available, Suction available, Patient being monitored and Timeout performed Patient Re-evaluated:Patient Re-evaluated prior to induction Oxygen Delivery Method: Nasal cannula Induction Type: IV induction Placement Confirmation: positive ETCO2

## 2018-03-23 NOTE — Progress Notes (Signed)
To the best of my knowledge, documentation by Evangeline Dakin, nursing student at [employer], is correct.

## 2018-03-23 NOTE — Anesthesia Postprocedure Evaluation (Signed)
Anesthesia Post Note  Patient: Chelsea Keller  Procedure(s) Performed: COLONOSCOPY WITH PROPOFOL (N/A ) HOT HEMOSTASIS (ARGON PLASMA COAGULATION/BICAP) (N/A )     Patient location during evaluation: Endoscopy Anesthesia Type: MAC Level of consciousness: awake and alert Pain management: pain level controlled Vital Signs Assessment: post-procedure vital signs reviewed and stable Respiratory status: spontaneous breathing, nonlabored ventilation, respiratory function stable and patient connected to nasal cannula oxygen Cardiovascular status: stable and blood pressure returned to baseline Postop Assessment: no apparent nausea or vomiting Anesthetic complications: no    Last Vitals:  Vitals:   03/23/18 1100 03/23/18 1129  BP: (!) 147/47 (!) 133/51  Pulse: 77 70  Resp: 15 18  Temp:  36.6 C  SpO2: 100% 100%    Last Pain:  Vitals:   03/23/18 1129  TempSrc: Oral  PainSc:                  [place]et [place]

## 2018-03-23 NOTE — Progress Notes (Signed)
PROGRESS NOTE  Chelsea Keller  ZGY:[id number] DOB: 01-May-1935 DOA: 03/21/2018 PCP: Venia Carbon, MD   Brief Narrative: 83 y.o.femalewith medical history significant ofpsoriasis,pulmonary embolism on Eliquis, diverticular disease, osteoarthritis, morbid obesity, B12 deficiency who presented to the ER with symptomatic anemia.  Work-up was significant for hemoglobin of 4.7, she had Hemoccult positive stool, but no evidence of overt GI bleed.  She was transfused 4 units PRBC, and seen by GI for further evaluation, Eliquis on hold since admission.   Assessment & Plan: Principal Problem:   Anemia Active Problems:   Essential hypertension   Venous (peripheral) insufficiency   Hx of adenomatous colonic polyps   Heme + stool   Vitamin B12 deficiency   Symptomatic anemia   Angiodysplasia of colon  symptomatic microcytic anemia: Due to GI bleeding.  - hgb up appropriately s/p 4u PRBCs 4.7 > 8.7. Recheck in AM.  - Feraheme ordered, f/u with PCP for ongoing iron repletion. Recommend close CBC monitoring given ongoing need for anticoagulation.  GI bleed due to AVMs: s/p APC at time of colonoscopy 9/18.  - GI consulted, will monitor CBC in AM and for bleeding overnight.  - Resume diet per GI  History of PE - Really will need to restart anticoagulation, per GI can restart in 2 days.    HTN:  - Continue home regimen and monitor  Morbid obesity: BMI 45  - Counseling provided.  Chronic kidney disease stage III: - Appears stable. At baseline.  History of vitamin B12 deficiency: - B12 within normal level  DVT prophylaxis: SCDs Code Status: Full Family Communication: None at bedside Disposition Plan: Home once hgb stable  Consultants:   GI  Procedures:   Colonoscopy 03/23/2018 by Dr. Carlean Purl: Impression:       - Multiple non-bleeding colonic angiodysplastic                            lesions. Treated with argon plasma coagulation                            (APC).                         - Diverticulosis in the left colon.                           - Internal hemorrhoids.                           - The examination was otherwise normal on direct                            and retroflexion views.                           - No specimens collected. Recommendation: Patient has a contact number available for                            emergencies. The signs and symptoms of potential                            delayed complications were discussed with the  patient. Return to normal activities tomorrow.                            Written discharge instructions were provided to the                            patient.                           - Resume previous diet.                           - Continue present medications.                           - Resume Eliquis (apixaban) at prior dose in 2                            days. Assuming no problems before then.                           Feraheme x 1 ordered - will need f/u CBC's and oral                            vs parenteral iron or both (PCP can do this)                           As long as no new issues does not need GI                            appointment after this hospitalization                           - No recommendation at this time regarding repeat                            colonoscopy due to age.  Antimicrobials:  None   Subjective: Feels much less weak after transfusions. Getting up to [facility] had no dyspnea whereas she was extremely dyspneic on any exertion PTA. No bleeding reported. No chest pain.  Objective: Vitals:   03/23/18 0912 03/23/18 1052 03/23/18 1100 03/23/18 1129  BP: (!) 152/55 (!) 134/54 (!) 147/47 (!) 133/51  Pulse: 76 83 77 70  Resp: 20 15 15 18   Temp: 98.6 F (37 C) 98 F (36.7 C)  97.9 F (36.6 C)  TempSrc: Oral Oral  Oral  SpO2: 95% 100% 100% 100%  Weight:      Height:        Intake/Output Summary (Last 24 hours) at 03/23/2018  1850 Last data filed at 03/23/2018 1737 Gross per 24 hour  Intake 440 ml  Output 1250 ml  Net -810 ml   Filed Weights   03/22/18 0452 03/23/18 0300 03/23/18 0904  Weight: 114.2 kg 113.1 kg 112.9 kg    Gen: 83 y.o. female in no distress  Pulm: Non-labored breathing room air. Clear to auscultation bilaterally.  CV: Regular rate and rhythm. No murmur, rub, or gallop. No JVD, no pedal edema.  GI: Abdomen soft, non-tender, non-distended, with normoactive bowel sounds. No organomegaly or masses felt. Ext: Warm, no deformities Skin: No rashes, lesions no ulcers Neuro: Alert and oriented. No focal neurological deficits. Psych: Judgement and insight appear normal. Mood & affect appropriate.   Data Reviewed: I have personally reviewed following labs and imaging studies  CBC: Recent Labs  Lab 03/21/18 1855 03/22/18 0248 03/22/18 0300 03/22/18 1656 03/23/18 0604  WBC 12.4* 12.6*  --  12.3* 11.4*  HGB 4.7* 6.4*  --  8.4* 8.7*  HCT 18.7* 23.3* 22.4* 28.5* 29.3*  MCV 71.4* 75.9*  --  77.0* 76.9*  PLT 405* 340  --  329 097   Basic Metabolic Panel: Recent Labs  Lab 03/21/18 1855 03/22/18 0248 03/23/18 0604  NA 137 139 139  K 3.0* 2.9* 2.9*  CL 97* 98 98  CO2 25 25 30   GLUCOSE 104* 109* 92  BUN 20 21 14   CREATININE 1.19* 1.24* 1.02*  CALCIUM 9.0 8.7* 8.5*   GFR: Estimated Creatinine Clearance: 51.4 mL/min (A) (by C-G formula based on SCr of 1.02 mg/dL (H)). Liver Function Tests: Recent Labs  Lab 03/22/18 0248 03/23/18 0604  AST 16 14*  ALT 10 11  ALKPHOS 65 74  BILITOT 1.9* 2.3*  PROT 4.9* 5.1*  ALBUMIN 2.7* 2.8*   No results for input(s): LIPASE, AMYLASE in the last 168 hours. No results for input(s): AMMONIA in the last 168 hours. Coagulation Profile: No results for input(s): INR, PROTIME in the last 168 hours. Cardiac Enzymes: No results for input(s): CKTOTAL, CKMB, CKMBINDEX, TROPONINI in the last 168 hours. BNP (last 3 results) No results for input(s): PROBNP  in the last 8760 hours. HbA1C: No results for input(s): HGBA1C in the last 72 hours. CBG: No results for input(s): GLUCAP in the last 168 hours. Lipid Profile: No results for input(s): CHOL, HDL, LDLCALC, TRIG, CHOLHDL, LDLDIRECT in the last 72 hours. Thyroid Function Tests: No results for input(s): TSH, T4TOTAL, FREET4, T3FREE, THYROIDAB in the last 72 hours. Anemia Panel: Recent Labs    03/22/18 0301  VITAMINB12 532  TIBC 414  IRON 32   Urine analysis:    Component Value Date/Time   COLORURINE YELLOW 06/23/2017 0928   APPEARANCEUR CLEAR 06/23/2017 0928   LABSPEC 1.021 06/23/2017 0928   PHURINE 5.0 06/23/2017 0928   GLUCOSEU NEGATIVE 06/23/2017 0928   HGBUR NEGATIVE 06/23/2017 0928   HGBUR negative 01/13/2010 1056   BILIRUBINUR NEGATIVE 06/23/2017 0928   BILIRUBINUR negative 02/11/2017 1709   KETONESUR NEGATIVE 06/23/2017 0928   PROTEINUR NEGATIVE 06/23/2017 0928   UROBILINOGEN 0.2 02/11/2017 1709   UROBILINOGEN 0.2 01/13/2010 1056   NITRITE NEGATIVE 06/23/2017 0928   LEUKOCYTESUR NEGATIVE 06/23/2017 0928   No results found for this or any previous visit (from the past 240 hour(s)).    Radiology Studies: Dg Chest 2 View  Result Date: 03/21/2018 CLINICAL DATA:  Bilateral chest pain EXAM: CHEST - 2 VIEW COMPARISON:  11/06/2017 FINDINGS: Stable mild cardiomegaly. Nonaneurysmal atherosclerotic aorta. No acute pulmonary consolidation, mass or edema. No effusion or pneumothorax. Osteoarthritis of the right AC joint with subchondral areas of sclerosis involving the right humeral head suspicious for changes of AVN, unchanged in appearance. IMPRESSION: No active pulmonary disease. Aortic atherosclerosis without aneurysm. Probable AVN of the right humeral head. Electronically Signed   By: Ashley Royalty M.D.   On: 03/21/2018 19:38    Scheduled Meds: . sodium chloride   Intravenous Once  . metoCLOPramide (REGLAN) injection  10 mg Intravenous Once  .  nystatin  5 mL Oral QID  .  pantoprazole  40 mg Oral Q0600  . polyethylene glycol powder  0.5 Container Oral Once  . potassium chloride SA  40 mEq Oral Daily   Continuous Infusions: . lactated ringers Stopped (03/23/18 1200)     LOS: 2 days   Time spent: 25 minutes.  Patrecia Pour, MD [facility] [email or web address] Password [email or web address] 03/23/2018, 6:50 PM

## 2018-03-23 NOTE — Transfer of Care (Signed)
Immediate Anesthesia Transfer of Care Note  Patient: Chelsea Keller  Procedure(s) Performed: COLONOSCOPY WITH PROPOFOL (N/A ) HOT HEMOSTASIS (ARGON PLASMA COAGULATION/BICAP) (N/A )  Patient Location: Endoscopy Unit  Anesthesia Type:MAC  Level of Consciousness: awake, alert  and oriented  Airway & Oxygen Therapy: Patient Spontanous Breathing and Patient connected to nasal cannula oxygen  Post-op Assessment: Report given to RN, Post -op Vital signs reviewed and stable and Patient moving all extremities X 4  Post vital signs: Reviewed and stable  Last Vitals:  Vitals Value Taken Time  BP    Temp    Pulse 83 03/23/2018 10:50 AM  Resp 20 03/23/2018 10:50 AM  SpO2 100 % 03/23/2018 10:50 AM  Vitals shown include unvalidated device data.  Last Pain:  Vitals:   03/23/18 0912  TempSrc: Oral  PainSc:       Patients Stated Pain Goal: 0 (51/10/21 1173)  Complications: No apparent anesthesia complications

## 2018-03-23 NOTE — Consult Note (Signed)
   [facility] CM Inpatient Consult   03/23/2018  Chelsea Keller 23-Oct-1934 [id number]  [facility]. Met with the patient at the bedside.  Patient was eating and tolerating lunch..  She states she had her colonoscopy earlier today and feels better now.  She states she still lives with her daughter.  She states she no longer drives but her daughter provides transportation.  She denies any barriers with medications or resources needed.  She pleasantly states, "I feel that I am going to be fine now."   Patient did accept a brochure with 24 hour magnet nurse advise line along with contact information.  No needs identified.  Will follow with General EMMI calls.  For questions contact:   Natividad Brood, RN BSN [facility] Liaison  [phone] business mobile phone Toll free office [phone]

## 2018-03-23 NOTE — Interval H&P Note (Signed)
History and Physical Interval Note:  03/23/2018 10:04 AM  Chelsea Keller  has presented today for surgery, with the diagnosis of heme + anemia  The various methods of treatment have been discussed with the patient and family. After consideration of risks, benefits and other options for treatment, the patient has consented to  Procedure(s) with comments: COLONOSCOPY WITH PROPOFOL (N/A) - will do first before possible EGD ESOPHAGOGASTRODUODENOSCOPY (EGD) WITH PROPOFOL (N/A) - possible depending upon colonoscopy results as a surgical intervention .  The patient's history has been reviewed, patient examined, no change in status, stable for surgery.  I have reviewed the patient's chart and labs.  Questions were answered to the patient's satisfaction.     Silvano Rusk

## 2018-03-23 NOTE — Op Note (Signed)
[facility] Patient Name: Chelsea Keller Procedure Date : 03/23/2018 MRN: [id number] Attending MD: Gatha Mayer , MD Date of Birth: 1935/06/14 CSN: [id number] Age: 83 Admit Type: Inpatient Procedure:                Colonoscopy Indications:              Heme positive stool, Iron deficiency anemia Providers:                Gatha Mayer, MD, Cleda Daub, RN, Elspeth Cho Tech., Technician, Rhae Lerner, CRNA Referring MD:              Medicines:                Propofol per Anesthesia, Monitored Anesthesia Care Complications:            No immediate complications. Estimated Blood Loss:     Estimated blood loss: none. Procedure:                Pre-Anesthesia Assessment:                           - Prior to the procedure, a History and Physical                            was performed, and patient medications and                            allergies were reviewed. The patient's tolerance of                            previous anesthesia was also reviewed. The risks                            and benefits of the procedure and the sedation                            options and risks were discussed with the patient.                            All questions were answered, and informed consent                            was obtained. Prior Anticoagulants: The patient                            last took Eliquis (apixaban) 3 days prior to the                            procedure. ASA Grade Assessment: III - A patient                            with severe systemic disease. After reviewing the  risks and benefits, the patient was deemed in                            satisfactory condition to undergo the procedure.                           After obtaining informed consent, the colonoscope                            was passed under direct vision. Throughout the                            procedure, the patient's blood  pressure, pulse, and                            oxygen saturations were monitored continuously. The                            CF-[id number] ([id number]) Olympus adult colon was                            introduced through the anus and advanced to the the                            cecum, identified by appendiceal orifice and                            ileocecal valve. The colonoscopy was performed                            without difficulty. The patient tolerated the                            procedure well. The quality of the bowel                            preparation was excellent. The ileocecal valve,                            appendiceal orifice, and rectum were photographed. Scope In: 10:27:06 AM Scope Out: 10:40:44 AM Scope Withdrawal Time: 0 hours 9 minutes 30 seconds  Total Procedure Duration: 0 hours 13 minutes 38 seconds  Findings:      Multiple small to medium patchy angiodysplastic lesions without bleeding       were found in the ascending colon. Coagulation for bleeding prevention       using argon plasma was successful. Estimated blood loss: none.      Multiple diverticula were found in the left colon.      Internal hemorrhoids were found.      The exam was otherwise without abnormality on direct and retroflexion       views. Impression:               - Multiple non-bleeding colonic angiodysplastic  lesions. Treated with argon plasma coagulation                            (APC).                           - Diverticulosis in the left colon.                           - Internal hemorrhoids.                           - The examination was otherwise normal on direct                            and retroflexion views.                           - No specimens collected. Recommendation:           - Patient has a contact number available for                            emergencies. The signs and symptoms of potential                            delayed  complications were discussed with the                            patient. Return to normal activities tomorrow.                            Written discharge instructions were provided to the                            patient.                           - Resume previous diet.                           - Continue present medications.                           - Resume Eliquis (apixaban) at prior dose in 2                            days. Assuming no problems before then.                           Feraheme x 1 ordered - will need f/u CBC's and oral                            vs parenteral iron or both (PCP can do this)                           As long  as no new issues does not need GI                            appointment after this hospitalization                           - No recommendation at this time regarding repeat                            colonoscopy due to age. Procedure Code(s):        --- Professional ---                           [phone], Colonoscopy, flexible; with control of                            bleeding, any method Diagnosis Code(s):        --- Professional ---                           K55.20, Angiodysplasia of colon without hemorrhage                           K64.8, Other hemorrhoids                           R19.5, Other fecal abnormalities                           D50.9, Iron deficiency anemia, unspecified                           K57.30, Diverticulosis of large intestine without                            perforation or abscess without bleeding CPT copyright 2017 [employer]. All rights reserved. The codes documented in this report are preliminary and upon coder review may  be revised to meet current compliance requirements. Gatha Mayer, MD 03/23/2018 10:54:09 AM This report has been signed electronically. Number of Addenda: 0

## 2018-03-23 NOTE — Progress Notes (Signed)
Patient refused enema that was ordered this morning. Explained that if BM is not clear procedure will be cancelled. Per pt and night shift RN BM is clear, light green with no solid pieces  Vivika Poythress, RN

## 2018-03-23 NOTE — Progress Notes (Signed)
This am, pt's bowel movement is clear, light green. When she has a BM, it is all liquid (with no solid pieces). Patient does not want tap water enema this am due to these reasons. Day RN notified.

## 2018-03-23 NOTE — Progress Notes (Signed)
Patient states that she is unable to complete the second round of mirlax for tomorrow's procedure. She states that her stool is starting to look clear and she has had the hardest time drinking the medication. RN educated patient on the importance of finishing the prep but she stated that she will finish the first one but "there is not way i'll ever finish the second one." RN stated that patient could at least TRY to start the second one but she replied "I just can not." Will continue to monitor at this time.

## 2018-03-24 ENCOUNTER — Encounter (HOSPITAL_COMMUNITY): Payer: Self-pay | Admitting: Internal Medicine

## 2018-03-24 DIAGNOSIS — I872 Venous insufficiency (chronic) (peripheral): Secondary | ICD-10-CM

## 2018-03-24 DIAGNOSIS — R195 Other fecal abnormalities: Secondary | ICD-10-CM

## 2018-03-24 DIAGNOSIS — I10 Essential (primary) hypertension: Secondary | ICD-10-CM

## 2018-03-24 DIAGNOSIS — D649 Anemia, unspecified: Secondary | ICD-10-CM

## 2018-03-24 DIAGNOSIS — Z86010 Personal history of colonic polyps: Secondary | ICD-10-CM

## 2018-03-24 DIAGNOSIS — E538 Deficiency of other specified B group vitamins: Secondary | ICD-10-CM

## 2018-03-24 DIAGNOSIS — K5520 Angiodysplasia of colon without hemorrhage: Principal | ICD-10-CM

## 2018-03-24 LAB — CBC
HEMATOCRIT: 30.4 % — AB (ref 36.0–46.0)
Hemoglobin: 9.1 g/dL — ABNORMAL LOW (ref 12.0–15.0)
MCH: 23.3 pg — ABNORMAL LOW (ref 26.0–34.0)
MCHC: 29.9 g/dL — ABNORMAL LOW (ref 30.0–36.0)
MCV: 77.9 fL — AB (ref 78.0–100.0)
Platelets: 282 10*3/uL (ref 150–400)
RBC: 3.9 MIL/uL (ref 3.87–5.11)
RDW: 22.8 % — ABNORMAL HIGH (ref 11.5–15.5)
WBC: 12.8 10*3/uL — AB (ref 4.0–10.5)

## 2018-03-24 MED ORDER — FERROUS SULFATE 325 (65 FE) MG PO TABS: 325.0000 mg | ORAL_TABLET | Freq: Every day | ORAL | 0 refills | Status: DC

## 2018-03-24 MED ORDER — POLYETHYLENE GLYCOL 3350 17 GM/SCOOP PO POWD: 17.0000 g | Freq: Every day | ORAL | 0 refills | Status: DC

## 2018-03-24 NOTE — Progress Notes (Signed)
Discharge to home, transported by daughter. D/c instructions and follow up appointments discussed with the patient and daughter, verbalized understanding.

## 2018-03-24 NOTE — Discharge Summary (Signed)
Physician Discharge Summary  [name] FMB:[id number] DOB: 09-18-1934 DOA: 03/21/2018  PCP: Chelsea Carbon, MD  Admit date: 03/21/2018 Discharge date: 03/24/2018  Admitted From: Home Disposition: Home   Recommendations for Outpatient Follow-up:  1. Follow up with PCP in 1-2 weeks 2. Please obtain BMP/CBC in one week. Recommend repeat IV iron infusion as an outpatient.  3. Restart eliquis on 9/20 per GI recommendations and follow up with GI prn or seek medical attention urgently if bleeding returns.   Home Health: None Equipment/Devices: None Discharge Condition: Stable CODE STATUS: Full Diet recommendation: Regular  Brief/Interim Summary: Chelsea Keller is an 83 y.o.femalewith medical history significant ofpsoriasis,pulmonary embolism on eliquis, diverticular disease, osteoarthritis, morbid obesity, B12 deficiency who presented to the ER with symptomatic anemia.Work-up was significant for hemoglobin of 4.7, she had Hemoccult positive stool, but no evidence of overt GI bleed.She was transfused 4 units PRBC, given IV iron, and seen by GI for further evaluation, Eliquis on hold since admission. Colonoscopy revealed several AVMs which were treated with APC. Hemoglobin has continued to trend upward and no overt bleeding has been noted. She is stable for discharge with resolution of anemic symptoms. She is stable for discharge and will restart eliquis on 9/20 and have PCP follow up scheduled.  Discharge Diagnoses:  Principal Problem:   Anemia Active Problems:   Essential hypertension   Venous (peripheral) insufficiency   Hx of adenomatous colonic polyps   Heme + stool   Vitamin B12 deficiency   Symptomatic anemia   Angiodysplasia of colon  Symptomatic acute on chronic blood loss anemia: Due colonic AVMs.  - hgb up appropriately s/p 4u PRBCs 4.7 > 8.7. Stable on recheck on day of discharge at 9.1g/dl.  - Feraheme given. Started on oral iron and miralax.  - Strongly recommend  f/u with PCP for ongoing iron repletion. Recommend close CBC monitoring given ongoing need for anticoagulation.  GI bleed due to AVMs: s/p APC at time of colonoscopy 9/18.  - GI recommends prn follow up with them.  - Resume diet   History of PE - Needs to restart anticoagulation, per GI can restart 9/20.    HTN:  -Continue home regimen   Morbid obesity: BMI 45 -Counseling provided.  Chronic kidney disease stage III: -Appears stable. At baseline.  History of vitamin B12 deficiency: - B12 within normal level  Discharge Instructions Discharge Instructions    Diet - low sodium heart healthy   Complete by:  As directed    Discharge instructions   Complete by:  As directed    Restart eliquis tomorrow. Start taking iron every day. Because this causes constipation, you should also take miralax every day as well. Follow up with your PCP in the next 1-2 weeks for recheck of labs, or seek medical attention sooner if you notice GI bleeding.   Increase activity slowly   Complete by:  As directed      Allergies as of 03/24/2018      Reactions   Codeine Sulfate Shortness Of Breath   Celecoxib Other (See Comments)   Caused vaginal bleeding   Erythromycin Base Nausea And Vomiting      Medication List    STOP taking these medications   diclofenac sodium 1 % Gel Commonly known as:  VOLTAREN   furosemide 80 MG tablet Commonly known as:  LASIX   lidocaine 5 % Commonly known as:  LIDODERM   magic mouthwash w/lidocaine Soln   nystatin 100000 UNIT/ML suspension Commonly known as:  MYCOSTATIN  predniSONE 20 MG tablet Commonly known as:  DELTASONE     TAKE these medications   acetaminophen 500 MG tablet Commonly known as:  TYLENOL Take 500 mg by mouth every 6 (six) hours as needed for headache (pain).   apixaban 5 MG Tabs tablet Commonly known as:  ELIQUIS Take 1 tablet (5 mg total) by mouth 2 (two) times daily.   ferrous sulfate 325 (65 FE) MG tablet Take  1 tablet (325 mg total) by mouth daily.   gabapentin 300 MG capsule Commonly known as:  NEURONTIN Take 1 capsule (300 mg total) by mouth 3 (three) times daily. What changed:  when to take this   polyethylene glycol powder powder Commonly known as:  GLYCOLAX/MIRALAX Take 17 g by mouth daily. Take as needed to produce 1 normal bowel movement per day.   potassium chloride SA 20 MEQ tablet Commonly known as:  K-DUR,KLOR-CON TAKE 2 TABLETS (40 MEQ TOTAL) DAILY What changed:  See the new instructions.   traMADol 50 MG tablet Commonly known as:  ULTRAM Take 1-2 tablets (50-100 mg total) by mouth 3 (three) times daily as needed. What changed:  reasons to take this   triamterene-hydrochlorothiazide 37.5-25 MG tablet Commonly known as:  MAXZIDE-25 Take 1 tablet by mouth daily.      Follow-up Information    Chelsea Carbon, MD. Schedule an appointment as soon as possible for a visit in 1 week(s).   Specialties:  Internal Medicine, Pediatrics Contact information: [place] [address] [phone]          Allergies  Allergen Reactions  . Codeine Sulfate Shortness Of Breath  . Celecoxib Other (See Comments)    Caused vaginal bleeding  . Erythromycin Base Nausea And Vomiting    Consultations:  GI  Procedures/Studies: Dg Chest 2 View  Result Date: 03/21/2018 CLINICAL DATA:  Bilateral chest pain EXAM: CHEST - 2 VIEW COMPARISON:  11/06/2017 FINDINGS: Stable mild cardiomegaly. Nonaneurysmal atherosclerotic aorta. No acute pulmonary consolidation, mass or edema. No effusion or pneumothorax. Osteoarthritis of the right AC joint with subchondral areas of sclerosis involving the right humeral head suspicious for changes of AVN, unchanged in appearance. IMPRESSION: No active pulmonary disease. Aortic atherosclerosis without aneurysm. Probable AVN of the right humeral head. Electronically Signed   By: Ashley Royalty M.D.   On: 03/21/2018 19:38   Dg Fluoro Guide  Spinal/si Jt Inj Left  Result Date: 03/08/2018 CLINICAL DATA:  Lumbosacral spondylosis without myelopathy. Pain over the left SI joint. EXAM: SPINAL INJECTION UNDER FLUOROSCOPY (LEFT) FLUOROSCOPY TIME:  39 seconds.  10.2 mGy. PROCEDURE: The procedure, risks, benefits, and alternatives were explained to the patient. Questions regarding the procedure were encouraged and answered. The patient understands and consents to the procedure. LEFT SACROILIAC JOINT INJECTION: The skin dorsal to the sacroiliac joint was cleansed and anesthetized. A 22 gauge curved needle was directed into the inferior aspect of the sacroiliac joint. Intra articular positioning was confirmed by injecting a few cc of Omnipaque. One hundred twenty mg Depo-Medrol mixed with 1.5 cc 1% lidocaine were injected into the joint. COMPLICATIONS: None IMPRESSION: Technically successful left sacroiliac joint injection. Electronically Signed   By: Marybelle Killings M.D.   On: 03/08/2018 15:30     Colonoscopy 03/23/2018 by Dr. Carlean Purl: Impression: - Multiple non-bleeding colonic angiodysplastic  lesions. Treated with argon plasma coagulation  (APC). - Diverticulosis in the left colon. - Internal hemorrhoids. - The examination was otherwise normal on direct  and retroflexion views. -  No specimens collected. Recommendation: Patient has a contact number available for  emergencies. The signs and symptoms of potential  delayed complications were discussed with the  patient. Return to normal activities tomorrow.  Written discharge instructions were provided to the  patient. - Resume previous  diet. - Continue present medications. - Resume Eliquis (apixaban) at prior dose in 2  days. Assuming no problems before then. Feraheme x 1 ordered - will need f/u CBC's and oral  vs parenteral iron or both (PCP can do this) As long as no new issues does not need GI  appointment after this hospitalization - No recommendation at this time regarding repeat  colonoscopy due to age.  Subjective: Feels well, no further weakness or dyspnea. No bleeding noted. Wants to go home.  Discharge Exam: Vitals:   03/24/18 0455 03/24/18 0744  BP: (!) 143/55 132/62  Pulse: 74 77  Resp: 19 18  Temp: 98.4 F (36.9 C) 98.1 F (36.7 C)  SpO2: 95% 97%   General: Pt is alert, awake, not in acute distress Cardiovascular: RRR, S1/S2 +, no rubs, no gallops Respiratory: CTA bilaterally, no wheezing, no rhonchi Abdominal: Soft, NT, ND, bowel sounds + Extremities: No edema, no cyanosis  Labs: BNP (last 3 results) Recent Labs    06/23/17 0443 11/06/17 1436  BNP 79.5 433.2*   Basic Metabolic Panel: Recent Labs  Lab 03/21/18 1855 03/22/18 0248 03/23/18 0604  NA 137 139 139  K 3.0* 2.9* 2.9*  CL 97* 98 98  CO2 25 25 30   GLUCOSE 104* 109* 92  BUN 20 21 14   CREATININE 1.19* 1.24* 1.02*  CALCIUM 9.0 8.7* 8.5*   Liver Function Tests: Recent Labs  Lab 03/22/18 0248 03/23/18 0604  AST 16 14*  ALT 10 11  ALKPHOS 65 74  BILITOT 1.9* 2.3*  PROT 4.9* 5.1*  ALBUMIN 2.7* 2.8*   No results for input(s): LIPASE, AMYLASE in the last 168 hours. No results for input(s): AMMONIA in the last 168 hours. CBC: Recent Labs  Lab 03/21/18 1855 03/22/18 0248 03/22/18 0300 03/22/18 1656 03/23/18 0604 03/24/18 0512  WBC 12.4* 12.6*  --  12.3* 11.4* 12.8*  HGB 4.7* 6.4*   --  8.4* 8.7* 9.1*  HCT 18.7* 23.3* 22.4* 28.5* 29.3* 30.4*  MCV 71.4* 75.9*  --  77.0* 76.9* 77.9*  PLT 405* 340  --  329 299 282   Cardiac Enzymes: No results for input(s): CKTOTAL, CKMB, CKMBINDEX, TROPONINI in the last 168 hours. BNP: Invalid input(s): POCBNP CBG: No results for input(s): GLUCAP in the last 168 hours. D-Dimer No results for input(s): DDIMER in the last 72 hours. Hgb A1c No results for input(s): HGBA1C in the last 72 hours. Lipid Profile No results for input(s): CHOL, HDL, LDLCALC, TRIG, CHOLHDL, LDLDIRECT in the last 72 hours. Thyroid function studies No results for input(s): TSH, T4TOTAL, T3FREE, THYROIDAB in the last 72 hours.  Invalid input(s): FREET3 Anemia work up Recent Labs    03/22/18 0301  VITAMINB12 532  TIBC 414  IRON 32   Urinalysis    Component Value Date/Time   COLORURINE YELLOW 06/23/2017 [place] 06/23/2017 0928   LABSPEC 1.021 06/23/2017 0928   PHURINE 5.0 06/23/2017 0928   GLUCOSEU NEGATIVE 06/23/2017 0928   [place] 06/23/2017 0928   HGBUR negative 01/13/2010 [place] 06/23/2017 0928   BILIRUBINUR negative 02/11/2017 1709   KETONESUR NEGATIVE 06/23/2017 0928   PROTEINUR NEGATIVE 06/23/2017 0928   UROBILINOGEN 0.2 02/11/2017 1709  UROBILINOGEN 0.2 01/13/2010 1056   NITRITE NEGATIVE 06/23/2017 [place] 06/23/2017 4034    Microbiology No results found for this or any previous visit (from the past 240 hour(s)).  Time coordinating discharge: Approximately 40 minutes  Patrecia Pour, MD  [facility] 03/24/2018, 10:57 AM Pager [phone]

## 2018-03-24 NOTE — Care Management Note (Signed)
Case Management Note  Patient Details  Name: Shamaya Kauer MRN: [id number] Date of Birth: August 22, 1934  Subjective/Objective:   Anemia                Action/Plan: CM talked to patient at the bedside for [facility] choices, she requested [place]; Dan with [facility] called for arrangements.  Expected Discharge Date:  03/24/18               Expected Discharge Plan:  [place]  Discharge planning Services  CM Consult  Choice offered to:  Patient  HH Arranged:  PT [facility] Agency:  [place]  Status of Service:  In process, will continue to follow  Sherrilyn Rist [phone] 03/24/2018, 12:40 PM

## 2018-03-24 NOTE — Evaluation (Addendum)
Physical Therapy Evaluation Patient Details Name: Chelsea Keller MRN: [id number] DOB: 02/03/1935 Today's Date: 03/24/2018   History of Present Illness  83 y.o.femalewith medical history significant ofpsoriasis,pulmonary embolism on Eliquis, diverticular disease, osteoarthritis, morbid obesity, B12 deficiency who presented to the ER with symptomatic anemia.Work-up was significant for hemoglobin of 4.7, she had Hemoccult positive stool, but no evidence of overt GI bleed.She was transfused 4 units PRBC, and seen by GI for further evaluation, Eliquis on hold since admission.      Clinical Impression  Patient evaluated by Physical Therapy with no further acute PT needs identified. All education has been completed and the patient has no further questions. PTA, pt living at home with family 24 hour support, household ambulation with RW. Today pt ambulating short distances with RW, min guard for transfers, advised patient to have family with her during [place] mobility at this time, she is agreeable. HHPT to work on indepenence and progression of activity tolerance.  See below for any follow-up Physical Therapy or equipment needs. PT is signing off. Thank you for this referral.  SpO2 WNL on RA with activity      Follow Up Recommendations Home health PT;Supervision for mobility/OOB    Equipment Recommendations  None recommended by PT    Recommendations for Other Services       Precautions / Restrictions Restrictions Weight Bearing Restrictions: No      Mobility  Bed Mobility Overal bed mobility: Independent                Transfers Overall transfer level: Modified independent Equipment used: Rolling walker (2 wheeled)                Ambulation/Gait Ambulation/Gait assistance: Supervision Gait Distance (Feet): 50 Feet Assistive device: Rolling walker (2 wheeled) Gait Pattern/deviations: Step-to pattern;Step-through pattern Gait velocity: decreased   General Gait  Details: pt ambulating with painful gait due to L foot pain after colonoscpy, safe at this time no LOB  Stairs            Wheelchair Mobility    Modified Rankin (Stroke Patients Only)       Balance Overall balance assessment: Mild deficits observed, not formally tested                                           Pertinent Vitals/Pain Pain Assessment: Faces Faces Pain Scale: Hurts a little bit Pain Location: L foot Pain Descriptors / Indicators: Discomfort Pain Intervention(s): Limited activity within patient's tolerance;Monitored during session;Premedicated before session    Home Living Family/patient expects to be discharged to:: Private residence Living Arrangements: Children Available Help at Discharge: Family;Available 24 hours/day Type of Home: House Home Access: Stairs to enter;Level entry Entrance Stairs-Rails: None Entrance Stairs-Number of Steps: 2 Home Layout: Two level;Bed/bath upstairs Home Equipment: Bedside commode;Tub bench;Walker - 4 wheels;Cane - single point;Grab bars - tub/shower;Shower seat;Other (comment)      Prior Function Level of Independence: Independent with assistive device(s)         Comments: rollator for household ambulation, independent with ADL     Hand Dominance   Dominant Hand: Right    Extremity/Trunk Assessment   Upper Extremity Assessment Upper Extremity Assessment: Overall WFL for tasks assessed    Lower Extremity Assessment Lower Extremity Assessment: Overall WFL for tasks assessed       Communication   Communication: No difficulties  Cognition Arousal/Alertness:  Awake/alert Behavior During Therapy: WFL for tasks assessed/performed Overall Cognitive Status: Within Functional Limits for tasks assessed                                        General Comments      Exercises     Assessment/Plan    PT Assessment All further PT needs can be met in the next venue of care   PT Problem List Decreased strength;Decreased range of motion;Decreased activity tolerance;Decreased balance       PT Treatment Interventions DME instruction;Gait training    PT Goals (Current goals can be found in the Care Plan section)  Acute Rehab PT Goals Patient Stated Goal: go home   PT Goal Formulation: With patient Time For Goal Achievement: 03/31/18 Potential to Achieve Goals: Good    Frequency     Barriers to discharge        Co-evaluation               AM-PAC PT "6 Clicks" Daily Activity  Outcome Measure Difficulty turning over in bed (including adjusting bedclothes, sheets and blankets)?: None Difficulty moving from lying on back to sitting on the side of the bed? : None Difficulty sitting down on and standing up from a chair with arms (e.g., wheelchair, bedside commode, etc,.)?: None Help needed moving to and from a bed to chair (including a wheelchair)?: A Little Help needed walking in hospital room?: A Little Help needed climbing 3-5 steps with a railing? : A Little 6 Click Score: 21    End of Session Equipment Utilized During Treatment: Gait belt Activity Tolerance: Patient tolerated treatment well Patient left: in bed;with call bell/phone within reach Nurse Communication: Mobility status PT Visit Diagnosis: Unsteadiness on feet (R26.81);Other abnormalities of gait and mobility (R26.89)    Time: 2202-6691 PT Time Calculation (min) (ACUTE ONLY): 25 min   Charges:   PT Evaluation $PT Eval Low Complexity: 1 Low PT Treatments $Gait Training: 8-22 mins        Reinaldo Berber, PT, DPT Acute Rehabilitation Services Pager: [phone] Office: [place] 03/24/2018, 1:44 PM

## 2018-03-24 NOTE — Progress Notes (Signed)
   Patient Name: Chelsea Keller Date of Encounter: 03/24/2018, 10:15 AM    Subjective  Feels ok Wants to go home  Objective  BP 132/62   Pulse 77   Temp 98.1 F (36.7 C) (Oral)   Resp 18   Ht 5\' 2"  (1.575 m)   Wt 113.7 kg Comment: BED  SpO2 97%   BMI 45.85 kg/m  NAD Still pale  CBC Latest Ref Rng & Units 03/24/2018 03/23/2018 03/22/2018  WBC 4.0 - 10.5 K/uL 12.8(H) 11.4(H) 12.3(H)  Hemoglobin 12.0 - 15.0 g/dL 9.1(L) 8.7(L) 8.4(L)  Hematocrit 36.0 - 46.0 % 30.4(L) 29.3(L) 28.5(L)  Platelets 150 - 400 K/uL 282 299 329       Assessment and Plan  Bleeding colonic angiodysplasia in setting of Eliquis tx  Better  S/p ablation AVM's  I am ok w/ dc today and restart Eliquis at home tomorrow  She does not need a f/u w/ GI unless more problems  FFe SO4 tx daily  Got feraheme x 1 here also  Would strongly consider setting up another as outpt  Gatha Mayer, MD, [facility]  Gastroenterology 03/24/2018 10:15 AM [phone]

## 2018-03-25 ENCOUNTER — Telehealth: Payer: Self-pay | Admitting: *Deleted

## 2018-03-25 NOTE — Telephone Encounter (Signed)
Attempted to contact pt to complete TCM; vm unavailable to leave message

## 2018-03-28 ENCOUNTER — Ambulatory Visit (HOSPITAL_COMMUNITY)
Admission: EM | Admit: 2018-03-28 | Discharge: 2018-03-28 | Disposition: A | Discharge status: Home / Self Care | Payer: Medicare Other | Attending: Family Medicine | Admitting: Family Medicine

## 2018-03-28 ENCOUNTER — Encounter: Payer: Self-pay | Admitting: Cardiovascular Disease

## 2018-03-28 ENCOUNTER — Encounter (HOSPITAL_COMMUNITY): Payer: Self-pay | Admitting: Emergency Medicine

## 2018-03-28 ENCOUNTER — Ambulatory Visit (INDEPENDENT_AMBULATORY_CARE_PROVIDER_SITE_OTHER): Payer: Medicare Other | Admitting: Cardiovascular Disease

## 2018-03-28 ENCOUNTER — Telehealth: Payer: Self-pay

## 2018-03-28 VITALS — BP 135/64 | HR 84 | Ht 62.0 in | Wt 258.2 lb

## 2018-03-28 DIAGNOSIS — M79641 Pain in right hand: Secondary | ICD-10-CM

## 2018-03-28 DIAGNOSIS — I10 Essential (primary) hypertension: Secondary | ICD-10-CM | POA: Diagnosis not present

## 2018-03-28 DIAGNOSIS — M109 Gout, unspecified: Secondary | ICD-10-CM

## 2018-03-28 DIAGNOSIS — I5032 Chronic diastolic (congestive) heart failure: Secondary | ICD-10-CM

## 2018-03-28 DIAGNOSIS — M79642 Pain in left hand: Secondary | ICD-10-CM

## 2018-03-28 MED ORDER — PREDNISONE 20 MG PO TABS: ORAL_TABLET | ORAL | 0 refills | Status: DC

## 2018-03-28 MED ORDER — HYDROMORPHONE HCL 2 MG PO TABS: 2.0000 mg | ORAL_TABLET | Freq: Four times a day (QID) | ORAL | 0 refills | Status: DC | PRN

## 2018-03-28 NOTE — Patient Instructions (Signed)
Medication Instructions:  Your physician recommends that you continue on your current medications as directed. Please refer to the Current Medication list given to you today.   Labwork: none  Testing/Procedures: none  Follow-Up: You are scheduled to see Dr. Angelena Form on October 10,2019 at 10:40  Any Other Special Instructions Will Be Listed Below (If Applicable).  Have hand evaluated today at [facility].    If you need a refill on your cardiac medications before your next appointment, please call your pharmacy.

## 2018-03-28 NOTE — Telephone Encounter (Signed)
Attempted to contact pt to complete TCM; unable to leave vm 

## 2018-03-28 NOTE — Telephone Encounter (Signed)
No other suggestions. She may have a cellulitis which would need abx. Have her let us know what cardiology says.

## 2018-03-28 NOTE — ED Provider Notes (Signed)
[place]    CSN: [id number] Arrival date & time: 03/28/18  1647     History   Chief Complaint Chief Complaint  Patient presents with  . Hand Pain    HPI Chelsea Keller is a 83 y.o. female.   c/o bilateral hand swelling and pain, worse on the R hand. R hand is red and swollen.   Patient had a transfusion last week when her hemoglobin dropped to around 5.  Her transfusion was in her right antecubital area.  That particular area as well as the forearm on the right had no pain whatsoever.  Patient has a history of gout.     Past Medical History:  Diagnosis Date  . Allergy   . Anemia   . Anxiety   . Arthritis   . Arthritis of sacroiliac joint of both sides 11/12/2017  . Bilateral pulmonary embolism ([place]) 06/23/2017  . Chronic diastolic CHF (congestive heart failure) (HCC)    a. Echo 1/16:  mild LVH, EF normal, grade 1 DD, MAC  . Chronic venous insufficiency    chronic LE edema  . DDD (degenerative disc disease), lumbar 11/12/2017  . Degenerative joint disease (DJD) of hip, Bilateral  11/12/2017  . Depression   . Fibromyalgia    constant pain  . Hx of cardiac catheterization    a. LHC in [place] "ok" per patient with mild plaque in a single vessel - records not available  . Hx of cardiovascular stress test    a. Nuclear study in 2008 normal  . Hx of colonic polyps   . Hypertension   . Hypertriglyceridemia   . Impaired fasting glucose   . PONV (postoperative nausea and vomiting)   . PUD (peptic ulcer disease)    hx of gastric ulcer  . Pulmonary emboli ([place]) 06/2017  . Vitamin B12 deficiency     Patient Active Problem List   Diagnosis Date Noted  . Angiodysplasia of colon   . Symptomatic anemia 03/21/2018  . Degenerative joint disease (DJD) of hip, Bilateral  11/12/2017  . DDD (degenerative disc disease), lumbar 11/12/2017  . Arthritis of sacroiliac joint of both sides 11/12/2017  . History of pulmonary embolism 11/07/2017  . Chronic anticoagulation  11/07/2017  . Diverticulitis of small intestine with perforation 11/07/2017  . Small bowel obstruction ([place]) 11/06/2017  . Neurodermatitis 07/28/2017  . Urinary incontinence 05/26/2017  . Intractable pain-left hip 04/03/2017  . Fibromyalgia 04/03/2017  . Anemia 04/03/2017  . Chronic diastolic CHF (congestive heart failure) ([place]) 04/03/2017  . Hypertension 04/03/2017  . Hypertriglyceridemia 04/03/2017  . Vitamin B12 deficiency 04/03/2017  . CKD (chronic kidney disease) stage 3, GFR 30-59 ml/min (HCC) 04/03/2017  . Acute hypokalemia 04/03/2017  . Left sided sciatica   . Advance directive discussed with patient 01/19/2017  . Heme + stool 10/15/2014  . Chronic diastolic congestive heart failure ([place]) 05/09/2014  . Mood disorder ([place]) 11/03/2013  . Knee joint pain 10/31/2013  . Psoriasis 02/21/2013  . Routine general medical examination at a health care facility 06/14/2012  . BMI 50.0-59.9, adult ([place]) 12/02/2011  . VENTRICULAR HYPERTROPHY, LEFT 10/19/2008  . Sleep disturbance 10/25/2007  . Venous (peripheral) insufficiency 08/11/2007  . Osteoarthritis, multiple sites 08/11/2007  . Impaired fasting glucose 08/11/2007  . Hx of adenomatous colonic polyps 05/20/2007  . B12 DEFICIENCY 09/30/2006  . Essential hypertension 09/30/2006  . Allergic rhinitis due to pollen 09/30/2006    Past Surgical History:  Procedure Laterality Date  . ABDOMINAL HYSTERECTOMY    .  CATARACT EXTRACTION  10/2003   OD  . CHOLECYSTECTOMY    . COLONOSCOPY W/ POLYPECTOMY    . COLONOSCOPY WITH PROPOFOL N/A 10/15/2014   Procedure: COLONOSCOPY WITH PROPOFOL;  Surgeon: Ladene Artist, MD;  Location: [facility] ENDOSCOPY;  Service: Endoscopy;  Laterality: N/A;  . COLONOSCOPY WITH PROPOFOL N/A 03/23/2018   Procedure: COLONOSCOPY WITH PROPOFOL;  Surgeon: Gatha Mayer, MD;  [facility];  Service: Endoscopy;  Laterality: N/A;  . ESOPHAGOGASTRODUODENOSCOPY (EGD) WITH PROPOFOL N/A 10/15/2014   Procedure:  ESOPHAGOGASTRODUODENOSCOPY (EGD) WITH PROPOFOL;  Surgeon: Ladene Artist, MD;  [facility] ENDOSCOPY;  Service: Endoscopy;  Laterality: N/A;  . EXTERNAL FIXATION ANKLE FRACTURE     Fx.  left ankle-fixation with pins later removed sec to infection 1985  . EXTERNAL FIXATION WRIST FRACTURE  1985   left with pins  . EYE SURGERY    . FEMUR FRACTURE SURGERY  06/2009  . FRACTURE SURGERY    . HARDWARE REMOVAL Left 10/05/2013   Procedure: HARDWARE REMOVAL LEFT DISTAL FEMUR;  Surgeon: Rozanna Box, MD;  Location: [place];  Service: Orthopedics;  Laterality: Left;  . HEMIARTHROPLASTY SHOULDER FRACTURE  06/2009  . HOT HEMOSTASIS N/A 03/23/2018   Procedure: HOT HEMOSTASIS (ARGON PLASMA COAGULATION/BICAP);  Surgeon: Gatha Mayer, MD;  Location: [facility] ENDOSCOPY;  Service: Endoscopy;  Laterality: N/A;  . JOINT REPLACEMENT    . STERIOD INJECTION Right 10/05/2013   Procedure: STEROID INJECTION;  Surgeon: Rozanna Box, MD;  Location: [place];  Service: Orthopedics;  Laterality: Right;  . TONSILLECTOMY    . TONSILLECTOMY    . TOTAL KNEE ARTHROPLASTY  03/09   left    OB History   None      Home Medications    Prior to Admission medications   Medication Sig Start Date End Date Taking? Authorizing Provider  acetaminophen (TYLENOL) 500 MG tablet Take 500 mg by mouth every 6 (six) hours as needed for headache (pain).    [provider]  apixaban (ELIQUIS) 5 MG TABS tablet Take 1 tablet (5 mg total) by mouth 2 (two) times daily. 09/14/17   Venia Carbon, MD  ferrous sulfate 325 (65 FE) MG tablet Take 1 tablet (325 mg total) by mouth daily. 03/24/18   Patrecia Pour, MD  gabapentin (NEURONTIN) 300 MG capsule Take 300 mg by mouth 2 (two) times daily.    [provider]  HYDROmorphone (DILAUDID) 2 MG tablet Take 1 tablet (2 mg total) by mouth every 6 (six) hours as needed for severe pain. 03/28/18   Robyn Haber, MD  polyethylene glycol powder (GLYCOLAX/MIRALAX) powder Take 17 g by mouth  daily. Take as needed to produce 1 normal bowel movement per day. 03/24/18   Patrecia Pour, MD  potassium chloride SA (K-DUR,KLOR-CON) 20 MEQ tablet Take 20 mEq by mouth 2 (two) times daily.    [provider]  predniSONE (DELTASONE) 20 MG tablet Two daily with food 03/28/18   Robyn Haber, MD  traMADol (ULTRAM) 50 MG tablet Take 50 mg by mouth 3 (three) times daily as needed.    [provider]  triamterene-hydrochlorothiazide (MAXZIDE-25) 37.5-25 MG tablet Take 1 tablet by mouth daily.    [provider]    Family History Family History  Problem Relation Age of Onset  . Depression Mother   . Cancer Mother        uterine cancer  . Heart attack Father   . Cancer Brother  prostate cancer  . Diabetes Maternal Aunt   . Arthritis Brother   . Asthma Brother   . Stroke Maternal Grandmother   . Pulmonary embolism Daughter     Social History Social History   Tobacco Use  . Smoking status: Former Smoker    Packs/day: 1.00    Years: 40.00    Pack years: 40.00    Types: Cigarettes    Last attempt to quit: 07/06/1993    Years since quitting: 24.7  . Smokeless tobacco: Never Used  Substance Use Topics  . Alcohol use: No    Alcohol/week: 0.0 standard drinks  . Drug use: No     Allergies   Codeine sulfate; Celecoxib; and Erythromycin base   Review of Systems Review of Systems  Gastrointestinal: Positive for constipation.  Musculoskeletal: Positive for joint swelling.  All other systems reviewed and are negative.    Physical Exam Triage Vital Signs ED Triage Vitals [03/28/18 1715]  Enc Vitals Group     BP (!) 123/55     Pulse Rate 80     Resp 16     Temp 97.9 F (36.6 C)     Temp src      SpO2 97 %     Weight      Height      Head Circumference      Peak Flow      Pain Score      Pain Loc      Pain Edu?      Excl. in [place]?    No data found.  Updated Vital Signs BP (!) 123/55   Pulse 80   Temp 97.9 F (36.6 C)   Resp 16    SpO2 97%   Visual Acuity Right Eye Distance:   Left Eye Distance:   Bilateral Distance:    Right Eye Near:   Left Eye Near:    Bilateral Near:     Physical Exam  Constitutional: She is oriented to person, place, and time. She appears well-developed and well-nourished.  Pulmonary/Chest: Effort normal.  Abdominal: There is no tenderness.  Musculoskeletal: She exhibits edema and tenderness.  Right hand is diffusely swollen with gray tenderness in the wrist joint dorsally.  She has trouble moving the joint.  The dorsal hand is red and swollen as well.  The left dorsal hand is also slightly swollen and there is a small erythematous area.  Neurological: She is alert and oriented to person, place, and time.  Skin: There is erythema.  Nursing note and vitals reviewed.      UC Treatments / Results  Labs (all labs ordered are listed, but only abnormal results are displayed) Labs Reviewed - No data to display  EKG None  Radiology No results found.  Procedures Procedures (including critical care time)  Medications Ordered in UC Medications - No data to display  Initial Impression / Assessment and Plan / UC Course  I have reviewed the triage vital signs and the nursing notes.  Pertinent labs & imaging results that were available during my care of the patient were reviewed by me and considered in my medical decision making (see chart for details).     Final Clinical Impressions(s) / UC Diagnoses   Final diagnoses:  Acute gout of right wrist, unspecified cause     Discharge Instructions     Take the MiraLAX every 3 days as needed for constipation    ED Prescriptions    Medication Sig Dispense Auth. Provider  predniSONE (DELTASONE) 20 MG tablet Two daily with food 10 tablet Robyn Haber, MD   HYDROmorphone (DILAUDID) 2 MG tablet Take 1 tablet (2 mg total) by mouth every 6 (six) hours as needed for severe pain. 10 tablet Robyn Haber, MD     Controlled  Substance Prescriptions [place] Controlled Substance Registry consulted? Not Applicable   Robyn Haber, MD 03/28/18 [phone]

## 2018-03-28 NOTE — ED Triage Notes (Signed)
Pt c/o bilateral hand swelling and pain, worse on the R hand. R hand is red and swollen.

## 2018-03-28 NOTE — Telephone Encounter (Signed)
Copied from [place] [phone]. Topic: Appointment Scheduling - Scheduling Inquiry for Clinic >> Mar 28, 2018  9:18 AM Conception Chancy, NT wrote: Reason for CRM: Cindi is calling for the patient to schedule a 1 week hospital follow up. Dr. Silvio Pate first available appointment is not until 05/03/18. Can you please contact the daughter to schedule. Also she states her hands where the IV was is red and hot to touch and she is unable to use them. She would like some recommendation on that.   Before 12:30 [phone] After 12:30 [phone]

## 2018-03-28 NOTE — Telephone Encounter (Signed)
Spoke to daughter. She will ask the cardiologist.

## 2018-03-28 NOTE — Telephone Encounter (Signed)
Spoke to pt's daughter. Made appt 04-01-18.  In regards to her hands, they have been putting ice, heat, wraps, braces, elevation and her hands are still painful. She cannot hold a utensil to eat. She has a hard time getting up. She is going to her cardiologist today and will have him look at her hands. Any suggestions before then?

## 2018-03-28 NOTE — Progress Notes (Signed)
0  Chief Complaint  Patient presents with  . Follow-up    chronic diastolic chf   History of Present Illness: 83 yo female with history of anemia with recent GI bleeding, chronic diastolic CHF, PE, obesity, HTN and chronic lower extremity edema here today for cardiac follow up. She has a remote history of tobacco abuse. She has had a normal myoview stress test in April of 2008. Her echo in May 2010 showed normal LV function and no significant valvular issues. I saw her in January 2016 and she c/o weakness, dizziness, dyspnea.  She was seen in primary care but chest x-ray was ok. She has chronic lower extremity edema and had been off of her Lasix. She endorsed a cough productive of clear sputum. She was admitted to [facility] for diuresis and aldactone was added. Echo with normal LV function in 2016. CTA chest negative for PE (elevated d-dimer). She was found to be anemic with Hgb of 8.7 (microcytic hypochromic). She was referred to GI and underwent EGD and colonoscopy 10/15/14. This showed gastritis, AVMs in the colon, polyps and divertculosis, hemorrhoids. Her chronic dyspnea is felt to be due to her obesity, deconditioning and anemia. Most recent echo in December 2018 with normal LV systolic function, grade 1 diastolic dysfunction and trivial AI. She was admitted to [facility] December 2018 with dyspnea and was found to have bilateral pulmonary emboli. She was started on Eliquis.   She called our office 03/21/18 with c/o dyspnea, chest pressure and dizziness. We asked her to come into the ED. Chest x-ray was ok. She was found to have severe anemia (Hgb 4.7) felt to be due to active GI bleeding. She was found to have AVMs which were treated. She was transfused 4 units of pRBCs. Her symptoms resolved with resolution of her anemia. She noted right hand pain and swelling prior to discharge. Per the patient this was not addressed. She continues to have worsening pain in both hands and her right hand is swollen. She cannot  use either hand due to pain. The patient denies any chest pain, dyspnea, palpitations, lower extremity edema, orthopnea, PND, dizziness, near syncope or syncope.    Primary Care Physician: Venia Carbon, MD  Past Medical History:  Diagnosis Date  . Allergy   . Anemia   . Anxiety   . Arthritis   . Arthritis of sacroiliac joint of both sides 11/12/2017  . Bilateral pulmonary embolism ([place]) 06/23/2017  . Chronic diastolic CHF (congestive heart failure) (HCC)    a. Echo 1/16:  mild LVH, EF normal, grade 1 DD, MAC  . Chronic venous insufficiency    chronic LE edema  . DDD (degenerative disc disease), lumbar 11/12/2017  . Degenerative joint disease (DJD) of hip, Bilateral  11/12/2017  . Depression   . Fibromyalgia    constant pain  . Hx of cardiac catheterization    a. LHC in [place] "ok" per patient with mild plaque in a single vessel - records not available  . Hx of cardiovascular stress test    a. Nuclear study in 2008 normal  . Hx of colonic polyps   . Hypertension   . Hypertriglyceridemia   . Impaired fasting glucose   . PONV (postoperative nausea and vomiting)   . PUD (peptic ulcer disease)    hx of gastric ulcer  . Pulmonary emboli ([place]) 06/2017  . Vitamin B12 deficiency     Past Surgical History:  Procedure Laterality Date  . ABDOMINAL HYSTERECTOMY    .  CATARACT EXTRACTION  10/2003   OD  . CHOLECYSTECTOMY    . COLONOSCOPY W/ POLYPECTOMY    . COLONOSCOPY WITH PROPOFOL N/A 10/15/2014   Procedure: COLONOSCOPY WITH PROPOFOL;  Surgeon: Ladene Artist, MD;  [facility] ENDOS[facility];  Service: Endoscopy;  Laterality: N/A;  . COLONOSCOPY WITH PROPOFOL N/A 03/23/2018   Procedure: COLONOSCOPY WITH PROPOFOL;  Surgeon: Gatha Mayer, MD;  Location[facility] ENDOSCOPY;  Service: Endoscopy;  Laterality: N/A;  . ESOPHAGOGASTRODUODENOSCOPY (EGD) WITH PROPOFOL N/A 10/15/2014   Procedure: ESOPHAGOGASTRODUODENOSCOPY (EGD) WITH PROPOFOL;  Surgeon: Ladene Artist, MD;  [facility] ENDOSCOPY;   Service: Endoscopy;  Laterality: N/A;  . EXTERNAL FIXATION ANKLE FRACTURE     Fx.  left ankle-fixation with pins later removed sec to infection 1985  . EXTERNAL FIXATION WRIST FRACTURE  1985   left with pins  . EYE SURGERY    . FEMUR FRACTURE SURGERY  06/2009  . FRACTURE SURGERY    . HARDWARE REMOVAL Left 10/05/2013   Procedure: HARDWARE REMOVAL LEFT DISTAL FEMUR;  Surgeon: Rozanna Box, MD;  Location: [place];  Service: Orthopedics;  Laterality: Left;  . HEMIARTHROPLASTY SHOULDER FRACTURE  06/2009  . HOT HEMOSTASIS N/A 03/23/2018   Procedure: HOT HEMOSTASIS (ARGON PLASMA COAGULATION/BICAP);  Surgeon: Gatha Mayer, MD;  Location: [facility] ENDOSCOPY;  Service: Endoscopy;  Laterality: N/A;  . JOINT REPLACEMENT    . STERIOD INJECTION Right 10/05/2013   Procedure: STEROID INJECTION;  Surgeon: Rozanna Box, MD;  Location: [place];  Service: Orthopedics;  Laterality: Right;  . TONSILLECTOMY    . TONSILLECTOMY    . TOTAL KNEE ARTHROPLASTY  03/09   left    Current Outpatient Medications  Medication Sig Dispense Refill  . acetaminophen (TYLENOL) 500 MG tablet Take 500 mg by mouth every 6 (six) hours as needed for headache (pain).    Marland Kitchen apixaban (ELIQUIS) 5 MG TABS tablet Take 1 tablet (5 mg total) by mouth 2 (two) times daily. 30 tablet 0  . ferrous sulfate 325 (65 FE) MG tablet Take 1 tablet (325 mg total) by mouth daily. 30 tablet 0  . gabapentin (NEURONTIN) 300 MG capsule Take 300 mg by mouth 2 (two) times daily.    . polyethylene glycol powder (GLYCOLAX/MIRALAX) powder Take 17 g by mouth daily. Take as needed to produce 1 normal bowel movement per day. 250 g 0  . potassium chloride SA (K-DUR,KLOR-CON) 20 MEQ tablet Take 20 mEq by mouth 2 (two) times daily.    . traMADol (ULTRAM) 50 MG tablet Take 50 mg by mouth 3 (three) times daily as needed.    . triamterene-hydrochlorothiazide (MAXZIDE-25) 37.5-25 MG tablet Take 1 tablet by mouth daily.     No current facility-administered medications for  this visit.     Allergies  Allergen Reactions  . Codeine Sulfate Shortness Of Breath  . Celecoxib Other (See Comments)    Caused vaginal bleeding  . Erythromycin Base Nausea And Vomiting    Social History   Socioeconomic History  . Marital status: Widowed    Spouse name: Not on file  . Number of children: 4  . Years of education: Not on file  . Highest education level: Not on file  Occupational History  . Occupation: retired crossing guard  . 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 Financial resource strain: Not on file  . Food insecurity:    Worry: Not on file    Inability: Not on file  . Transportation needs:  Medical: Not on file    Non-medical: Not on file  Tobacco Use  . Smoking status: Former Smoker    Packs/day: 1.00    Years: 40.00    Pack years: 40.00    Types: Cigarettes    Last attempt to quit: 07/06/1993    Years since quitting: 24.7  . Smokeless tobacco: Never Used  Substance and Sexual Activity  . Alcohol use: No    Alcohol/week: 0.0 standard drinks  . Drug use: No  . Sexual activity: Not on file  Lifestyle  . Physical activity:    Days per week: Not on file    Minutes per session: Not on file  . Stress: Not on file  Relationships  . Social connections:    Talks on phone: Not on file    Gets together: Not on file    Attends religious service: Not on file    Active member of club or organization: Not on file    Attends meetings of clubs or organizations: Not on file    Relationship status: Not on file  . Intimate partner violence:    Fear of current or ex partner: Not on file    Emotionally abused: Not on file    Physically abused: Not on file    Forced sexual activity: Not on file  Other Topics Concern  . 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 Depression Mother   . Cancer Mother        uterine cancer  . Heart attack Father   . Cancer Brother        prostate cancer  . Diabetes Maternal Aunt   . Arthritis Brother   . Asthma Brother   . Stroke Maternal Grandmother   . Pulmonary embolism Daughter     Review of Systems:  As stated in the HPI and otherwise negative.   BP 135/64   Pulse 84   Ht 5' 2"  (1.575 m)   Wt 258 lb 3.2 oz (117.1 kg)   SpO2 95%   BMI 47.23 kg/m   Physical Examination:  General: Well developed, well nourished, NAD  HEENT: OP clear, mucus membranes moist  SKIN: warm, dry. No rashes. Neuro: No focal deficits  Musculoskeletal: Muscle strength 5/5 all ext  Psychiatric: Mood and affect normal  Neck: No JVD, no carotid bruits, no thyromegaly, no lymphadenopathy.  Lungs:Clear bilaterally, no wheezes, rhonci, crackles Cardiovascular: Regular rate and rhythm. No murmurs, gallops or rubs. Abdomen:Soft. Bowel sounds present. Non-tender.  Extremities: No lower extremity edema. Pulses are 2 + in the bilateral DP/PT.  Echo 06/23/17: - Left ventricle: The cavity size was normal. Systolic function was   normal. The estimated ejection fraction was in the range of 55%   to 60%. Wall motion was normal; there were no regional wall   motion abnormalities. Doppler parameters are consistent with   abnormal left ventricular relaxation (grade 1 diastolic   dysfunction). - Aortic valve: There was trivial regurgitation. - Mitral valve: Calcified annulus. Mildly thickened leaflets . - Atrial septum: No defect or patent foramen  ovale was identified. - Pulmonary arteries: PA peak pressure: 32 mm Hg (S).  EKG:  EKG is not  ordered today. The ekg ordered today demonstrates   Recent Labs: 11/06/2017: B Natriuretic Peptide  120.9 11/09/2017: Magnesium 1.8 03/23/2018: ALT 11; BUN 14; Creatinine, Ser 1.02; Potassium 2.9; Sodium 139 03/24/2018: Hemoglobin 9.1; Platelets 282   Lipid Panel    Component Value Date/Time   CHOL 177 06/23/2017 1020   TRIG 80 06/23/2017 1020   HDL 44 06/23/2017 1020   CHOLHDL 4.0 06/23/2017 1020   VLDL 16 06/23/2017 1020   LDLCALC 117 (H) 06/23/2017 1020   LDLDIRECT 86.2 05/25/2008 1115     Wt Readings from Last 3 Encounters:  03/28/18 258 lb 3.2 oz (117.1 kg)  03/24/18 250 lb 10.6 oz (113.7 kg)  02/03/18 265 lb (120.2 kg)     Other studies Reviewed: Additional studies/ records that were reviewed today include:  Review of the above records demonstrates:    Assessment and Plan:   1. Chronic diastolic CHF:. She has normal LV systolic function by echo in December 2018. She has grade 1 diastolic dysfunction.  I think her overall volume status is near her baseline. She cannot weigh at home but on our scales she is down 5 lbs from her weight in March 2019. I would continue Lasix 80 mg daily. She is on potassium supplements. She will need a BMET to check her potassium. I have asked her to have this done in the urgent care when she goes over for evaluation today.    2. HTN: BP is controlled. No changes.   3. Bilateral PE: Continue Eliquis.   4. Recent GI bleeding: H/H stable on discharge last week. GI bleeding due to [facility] and treated with APC.   5. Bilateral hand swelling and pain: Her right hand is swollen and red. Her left hand is painful to touch. She had manipulation of the skin of both arms and hands with IV catheters last week while admitted. This could represent gout. I do not think it appears to be cellulitis as both hands are painful. She could not get into primary care for evaluation until Friday of this week. I have asked her to go to the [facility] tonight for further evaluation.   Current medicines are reviewed at length with the patient today.  The patient does not  have concerns regarding medicines.  The following changes have been made:  no change  Labs/ tests ordered today include:   No orders of the defined types were placed in this encounter.  Disposition:   FU with me in 2-3 weeks.    Signed, Lauree Chandler, MD 03/28/2018 4:42 PM    [facility] [place], [place], [place]  [address] Phone: [phone]; Fax: [phone]

## 2018-03-28 NOTE — Discharge Instructions (Addendum)
Take the MiraLAX every 3 days as needed for constipation

## 2018-03-29 ENCOUNTER — Telehealth (HOSPITAL_COMMUNITY): Payer: Self-pay | Admitting: Family Medicine

## 2018-03-29 NOTE — Telephone Encounter (Signed)
Patient's daughter states that her left hand is much better but that her right hand is about the same with swelling and tenderness.  There is been no fever.  She is only had one dose of prednisone so far.  Patient will continue medicines and expect improvement over the next 24 to 48 hours and let me know on Thursday how she is doing.

## 2018-04-01 ENCOUNTER — Ambulatory Visit (INDEPENDENT_AMBULATORY_CARE_PROVIDER_SITE_OTHER): Payer: Medicare Other | Admitting: Internal Medicine

## 2018-04-01 ENCOUNTER — Encounter: Payer: Self-pay | Admitting: Internal Medicine

## 2018-04-01 ENCOUNTER — Other Ambulatory Visit: Payer: Medicare Other

## 2018-04-01 VITALS — BP 118/66 | HR 71 | Temp 98.2°F | Ht 62.0 in | Wt 236.0 lb

## 2018-04-01 DIAGNOSIS — K5520 Angiodysplasia of colon without hemorrhage: Secondary | ICD-10-CM | POA: Diagnosis not present

## 2018-04-01 DIAGNOSIS — D649 Anemia, unspecified: Secondary | ICD-10-CM

## 2018-04-01 DIAGNOSIS — I5032 Chronic diastolic (congestive) heart failure: Secondary | ICD-10-CM

## 2018-04-01 DIAGNOSIS — M109 Gout, unspecified: Secondary | ICD-10-CM | POA: Diagnosis not present

## 2018-04-01 DIAGNOSIS — Z86711 Personal history of pulmonary embolism: Secondary | ICD-10-CM | POA: Diagnosis not present

## 2018-04-01 LAB — RENAL FUNCTION PANEL
Albumin: 3.6 g/dL (ref 3.5–5.2)
BUN: 39 mg/dL — ABNORMAL HIGH (ref 6–23)
CALCIUM: 9.8 mg/dL (ref 8.4–10.5)
CHLORIDE: 93 meq/L — AB (ref 96–112)
CO2: 35 meq/L — AB (ref 19–32)
Creatinine, Ser: 1.04 mg/dL (ref 0.40–1.20)
GFR: 54 mL/min — AB (ref >60.00)
Glucose, Bld: 106 mg/dL — ABNORMAL HIGH (ref 70–99)
POTASSIUM: 3.2 meq/L — AB (ref 3.5–5.1)
Phosphorus: 3.2 mg/dL (ref 2.3–4.6)
Sodium: 140 mEq/L (ref 135–145)

## 2018-04-01 LAB — CBC
HEMATOCRIT: 36.5 % (ref 36.0–46.0)
HEMOGLOBIN: 11.5 g/dL — AB (ref 12.0–15.0)
MCHC: 31.6 g/dL (ref 30.0–36.0)
MCV: 77.2 fl — ABNORMAL LOW (ref 78.0–100.0)
PLATELETS: 389 10*3/uL (ref 150.0–400.0)
RBC: 4.72 Mil/uL (ref 3.87–5.11)
RDW: 30.2 % — ABNORMAL HIGH (ref 11.5–15.5)
WBC: 16.3 10*3/uL — AB (ref 4.0–10.5)

## 2018-04-01 LAB — URIC ACID: Uric Acid, Serum: 12.4 mg/dL — ABNORMAL HIGH (ref 2.4–7.0)

## 2018-04-01 MED ORDER — COLCHICINE 0.6 MG PO TABS: 0.6000 mg | ORAL_TABLET | Freq: Two times a day (BID) | ORAL | 3 refills | Status: DC | PRN

## 2018-04-01 NOTE — Assessment & Plan Note (Signed)
Will add colchicine and check uric acid levels Hadn't had a spell in a long time--so would not rush to using allopurinol

## 2018-04-01 NOTE — Assessment & Plan Note (Signed)
Much better with the lasix Recheck renal

## 2018-04-01 NOTE — Assessment & Plan Note (Signed)
Back on the eliquis

## 2018-04-01 NOTE — Progress Notes (Signed)
Subjective:    Patient ID: Chelsea Keller, female    DOB: [birth year], 83 y.o.   MRN: [id number]  HPI Here for follow up of hospitalization and UC visit Went to hospital due to fatigue and DOE ER evaluation showed hemoglobin of 4.7 Transfused, got IV iron, had colonscopy with treatment of AVMs  Fatigue and SOB is better Still feels unsteady on feet Needs walker all the time  Taking the lasix again 60m once a day Has lost 20# Legs are back down again  No chest pain No dizziness  Went back to UC from cardiology office due to hand swelling Diagnosed with gout Steroid Rx and dilaudid (not nearly out of it) Hands are better but still having pain Little use of right hand Does have appt with Dr RJanelle Floormanagement  Current Outpatient Medications on File Prior to Visit  Medication Sig Dispense Refill  . acetaminophen (TYLENOL) 500 MG tablet Take 500 mg by mouth every 6 (six) hours as needed for headache (pain).    .Marland Kitchenapixaban (ELIQUIS) 5 MG TABS tablet Take 1 tablet (5 mg total) by mouth 2 (two) times daily. 30 tablet 0  . ferrous sulfate 325 (65 FE) MG tablet Take 1 tablet (325 mg total) by mouth daily. 30 tablet 0  . furosemide (LASIX) 40 MG tablet Take 80 mg by mouth daily.    .Marland Kitchengabapentin (NEURONTIN) 300 MG capsule Take 300 mg by mouth 2 (two) times daily.    .Marland KitchenHYDROmorphone (DILAUDID) 2 MG tablet Take 1 tablet (2 mg total) by mouth every 6 (six) hours as needed for severe pain. 10 tablet 0  . polyethylene glycol powder (GLYCOLAX/MIRALAX) powder Take 17 g by mouth daily. Take as needed to produce 1 normal bowel movement per day. 250 g 0  . potassium chloride SA (K-DUR,KLOR-CON) 20 MEQ tablet Take 20 mEq by mouth 2 (two) times daily.    . traMADol (ULTRAM) 50 MG tablet Take 50 mg by mouth 3 (three) times daily as needed.    . triamterene-hydrochlorothiazide (MAXZIDE-25) 37.5-25 MG tablet Take 1 tablet by mouth daily.     No current facility-administered medications on file  prior to visit.     Allergies  Allergen Reactions  . Codeine Sulfate Shortness Of Breath  . Celecoxib Other (See Comments)    Caused vaginal bleeding  . Erythromycin Base Nausea And Vomiting    Past Medical History:  Diagnosis Date  . Allergy   . Anemia   . Anxiety   . Arthritis   . Arthritis of sacroiliac joint of both sides 11/12/2017  . Bilateral pulmonary embolism ([place] 06/23/2017  . Chronic diastolic CHF (congestive heart failure) (HCC)    a. Echo 1/16:  mild LVH, EF normal, grade 1 DD, MAC  . Chronic venous insufficiency    chronic LE edema  . DDD (degenerative disc disease), lumbar 11/12/2017  . Degenerative joint disease (DJD) of hip, Bilateral  11/12/2017  . Depression   . Fibromyalgia    constant pain  . Hx of cardiac catheterization    a. LHC in [place]ok" per patient with mild plaque in a single vessel - records not available  . Hx of cardiovascular stress test    a. Nuclear study in 2008 normal  . Hx of colonic polyps   . Hypertension   . Hypertriglyceridemia   . Impaired fasting glucose   . PONV (postoperative nausea and vomiting)   . PUD (peptic ulcer disease)    hx of gastric ulcer  .  Pulmonary emboli ([place]) 06/2017  . Vitamin B12 deficiency     Past Surgical History:  Procedure Laterality Date  . ABDOMINAL HYSTERECTOMY    . CATARACT EXTRACTION  10/2003   OD  . CHOLECYSTECTOMY    . COLONOSCOPY W/ POLYPECTOMY    . COLONOSCOPY WITH PROPOFOL N/A 10/15/2014   Procedure: COLONOSCOPY WITH PROPOFOL;  Surgeon: Ladene Artist, MD;  Location: [facility] ENDOSCOPY;  Service: Endoscopy;  Laterality: N/A;  . COLONOSCOPY WITH PROPOFOL N/A 03/23/2018   Procedure: COLONOSCOPY WITH PROPOFOL;  Surgeon: Gatha Mayer, MD;  Location: [facility] ENDOSCOPY;  Service: Endoscopy;  Laterality: N/A;  . ESOPHAGOGASTRODUODENOSCOPY (EGD) WITH PROPOFOL N/A 10/15/2014   Procedure: ESOPHAGOGASTRODUODENOSCOPY (EGD) WITH PROPOFOL;  Surgeon: Ladene Artist, MD;  Location: [facility] ENDOSCOPY;  Service:  Endoscopy;  Laterality: N/A;  . EXTERNAL FIXATION ANKLE FRACTURE     Fx.  left ankle-fixation with pins later removed sec to infection 1985  . EXTERNAL FIXATION WRIST FRACTURE  1985   left with pins  . EYE SURGERY    . FEMUR FRACTURE SURGERY  06/2009  . FRACTURE SURGERY    . HARDWARE REMOVAL Left 10/05/2013   Procedure: HARDWARE REMOVAL LEFT DISTAL FEMUR;  Surgeon: Rozanna Box, MD;  Location: [facility];  Service: Orthopedics;  Laterality: Left;  . HEMIARTHROPLASTY SHOULDER FRACTURE  06/2009  . HOT HEMOSTASIS N/A 03/23/2018   Procedure: HOT HEMOSTASIS (ARGON PLASMA COAGULATION/BICAP);  Surgeon: Gatha Mayer, MD;  Location: [facility] ENDOSCOPY;  Service: Endoscopy;  Laterality: N/A;  . JOINT REPLACEMENT    . STERIOD INJECTION Right 10/05/2013   Procedure: STEROID INJECTION;  Surgeon: Rozanna Box, MD;  Location: [facility];  Service: Orthopedics;  Laterality: Right;  . TONSILLECTOMY    . TONSILLECTOMY    . TOTAL KNEE ARTHROPLASTY  03/09   left    Family History  Problem Relation Age of Onset  . Depression Mother   . Cancer Mother        uterine cancer  . Heart attack Father   . Cancer Brother        prostate cancer  . Diabetes Maternal Aunt   . Arthritis Brother   . Asthma Brother   . Stroke Maternal Grandmother   . Pulmonary embolism Daughter     Social History   Socioeconomic History  . Marital status: Widowed    Spouse name: Not on file  . Number of children: 4  . Years of education: Not on file  . Highest education level: Not on file  Occupational History  . Occupation: retired crossing guard  . 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 Financial resource strain: Not on file  . Food insecurity:    Worry: Not on file    Inability: Not on file  . Transportation needs:    Medical: Not on file    Non-medical: Not on file  Tobacco Use  . Smoking status: Former Smoker    Packs/day: 1.00    Years: 40.00    Pack years: 40.00    Types: Cigarettes     Last attempt to quit: 07/06/1993    Years since quitting: 24.7  . Smokeless tobacco: Never Used  Substance and Sexual Activity  . Alcohol use: No    Alcohol/week: 0.0 standard drinks  . Drug use: No  . Sexual activity: Not on file  Lifestyle  . Physical activity:    Days per week: Not on file    Minutes per session: Not on  file  . Stress: Not on file  Relationships  . Social connections:    Talks on phone: Not on file    Gets together: Not on file    Attends religious service: Not on file    Active member of club or organization: Not on file    Attends meetings of clubs or organizations: Not on file    Relationship status: Not on file  . Intimate partner violence:    Fear of current or ex partner: Not on file    Emotionally abused: Not on file    Physically abused: Not on file    Forced sexual activity: Not on file  Other Topics Concern  . 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  Neck: No thyromegaly present.  Cardiovascular: Normal rate, regular rhythm and normal heart sounds. Exam reveals no gallop.  No murmur heard. Respiratory: Effort normal and breath sounds normal. No respiratory distress. She has no wheezes. She has no rales.  GI: Soft. There is no tenderness.  Musculoskeletal: She exhibits no edema.  Right wrist swollen and exquisitely tender  Lymphadenopathy:    She has no cervical adenopathy.  Psychiatric:  Upset about the hand pain           Assessment & Plan:

## 2018-04-01 NOTE — Assessment & Plan Note (Signed)
Transfused and given iron Will check CBC today

## 2018-04-01 NOTE — Assessment & Plan Note (Signed)
Will periodically check CBC She will monitor for blood in stool

## 2018-04-02 DIAGNOSIS — M5136 Other intervertebral disc degeneration, lumbar region: Secondary | ICD-10-CM | POA: Diagnosis not present

## 2018-04-02 DIAGNOSIS — M48061 Spinal stenosis, lumbar region without neurogenic claudication: Secondary | ICD-10-CM | POA: Diagnosis not present

## 2018-04-04 ENCOUNTER — Telehealth: Payer: Self-pay

## 2018-04-04 DIAGNOSIS — I129 Hypertensive chronic kidney disease with stage 1 through stage 4 chronic kidney disease, or unspecified chronic kidney disease: Secondary | ICD-10-CM | POA: Diagnosis not present

## 2018-04-04 DIAGNOSIS — K648 Other hemorrhoids: Secondary | ICD-10-CM | POA: Diagnosis not present

## 2018-04-04 DIAGNOSIS — D519 Vitamin B12 deficiency anemia, unspecified: Secondary | ICD-10-CM | POA: Diagnosis not present

## 2018-04-04 DIAGNOSIS — E669 Obesity, unspecified: Secondary | ICD-10-CM | POA: Diagnosis not present

## 2018-04-04 DIAGNOSIS — K5520 Angiodysplasia of colon without hemorrhage: Secondary | ICD-10-CM | POA: Diagnosis not present

## 2018-04-04 DIAGNOSIS — I872 Venous insufficiency (chronic) (peripheral): Secondary | ICD-10-CM | POA: Diagnosis not present

## 2018-04-04 DIAGNOSIS — K5730 Diverticulosis of large intestine without perforation or abscess without bleeding: Secondary | ICD-10-CM | POA: Diagnosis not present

## 2018-04-04 DIAGNOSIS — Z86711 Personal history of pulmonary embolism: Secondary | ICD-10-CM | POA: Diagnosis not present

## 2018-04-04 DIAGNOSIS — Z7901 Long term (current) use of anticoagulants: Secondary | ICD-10-CM | POA: Diagnosis not present

## 2018-04-04 DIAGNOSIS — Z79899 Other long term (current) drug therapy: Secondary | ICD-10-CM | POA: Diagnosis not present

## 2018-04-04 DIAGNOSIS — D509 Iron deficiency anemia, unspecified: Secondary | ICD-10-CM | POA: Diagnosis not present

## 2018-04-04 DIAGNOSIS — N183 Chronic kidney disease, stage 3 (moderate): Secondary | ICD-10-CM | POA: Diagnosis not present

## 2018-04-04 LAB — CBC (INCLUDES DIFF/PLT) WITH PATHOLOGIST REVIEW
Basophils Absolute: 17 cells/uL (ref 0–200)
Basophils Relative: 0.1 %
EOS ABS: 34 {cells}/uL (ref 15–500)
Eosinophils Relative: 0.2 %
HEMATOCRIT: 38.1 % (ref 35.0–45.0)
HEMOGLOBIN: 11.6 g/dL — AB (ref 11.7–15.5)
LYMPHS ABS: 1581 {cells}/uL (ref 850–3900)
MCH: 24.1 pg — AB (ref 27.0–33.0)
MCHC: 30.4 g/dL — AB (ref 32.0–36.0)
MCV: 79.2 fL — ABNORMAL LOW (ref 80.0–100.0)
MPV: 10.1 fL (ref 7.5–12.5)
Monocytes Relative: 8 %
NEUTROS PCT: 82.4 %
Neutro Abs: 14008 cells/uL — ABNORMAL HIGH (ref 1500–7800)
Platelets: 395 10*3/uL (ref 140–400)
RBC: 4.81 10*6/uL (ref 3.80–5.10)
RDW: 25.1 % — ABNORMAL HIGH (ref 11.0–15.0)
TOTAL LYMPHOCYTE: 9.3 %
WBC: 17 10*3/uL — ABNORMAL HIGH (ref 3.8–10.8)
WBCMIX: 1360 {cells}/uL — AB (ref 200–950)

## 2018-04-04 NOTE — Telephone Encounter (Signed)
Both of those are fine

## 2018-04-04 NOTE — Telephone Encounter (Signed)
Verbal orders given to Stacy 

## 2018-04-04 NOTE — Telephone Encounter (Signed)
Copied from [place] [id number]. Topic: Quick Communication - See Telephone Encounter >> Apr 04, 2018  2:30 PM Antonieta Iba C wrote: CRM for notification. See Telephone encounter for: 04/04/18.   Stacy PT w/ [facility], requesting vo approval for 2 time a week for 4 weeks. Also like to add social work.   CB: [phone] - okay to lvm

## 2018-04-05 DIAGNOSIS — K5520 Angiodysplasia of colon without hemorrhage: Secondary | ICD-10-CM | POA: Diagnosis not present

## 2018-04-05 DIAGNOSIS — K5730 Diverticulosis of large intestine without perforation or abscess without bleeding: Secondary | ICD-10-CM | POA: Diagnosis not present

## 2018-04-05 DIAGNOSIS — I129 Hypertensive chronic kidney disease with stage 1 through stage 4 chronic kidney disease, or unspecified chronic kidney disease: Secondary | ICD-10-CM | POA: Diagnosis not present

## 2018-04-05 DIAGNOSIS — N183 Chronic kidney disease, stage 3 (moderate): Secondary | ICD-10-CM | POA: Diagnosis not present

## 2018-04-05 DIAGNOSIS — D509 Iron deficiency anemia, unspecified: Secondary | ICD-10-CM | POA: Diagnosis not present

## 2018-04-05 DIAGNOSIS — I872 Venous insufficiency (chronic) (peripheral): Secondary | ICD-10-CM | POA: Diagnosis not present

## 2018-04-06 ENCOUNTER — Telehealth: Payer: Self-pay | Admitting: Internal Medicine

## 2018-04-06 DIAGNOSIS — F320 Major depressive disorder, single episode, mild: Secondary | ICD-10-CM | POA: Insufficient documentation

## 2018-04-06 MED ORDER — SERTRALINE HCL 50 MG PO TABS: 50.0000 mg | ORAL_TABLET | Freq: Every day | ORAL | 1 refills | Status: DC

## 2018-04-06 NOTE — Telephone Encounter (Signed)
Daughter sent email Reviewed ongoing depression Daily symptoms of depression,anhedionia Sleep problems Cognitive issues, etc No suicidal ideation  Discussed MDD Will try medication---sertraline 50 (asked her to cut the first 2 in half) Discussed social interaction

## 2018-04-07 ENCOUNTER — Ambulatory Visit: Payer: Medicare Other | Admitting: Internal Medicine

## 2018-04-07 DIAGNOSIS — I129 Hypertensive chronic kidney disease with stage 1 through stage 4 chronic kidney disease, or unspecified chronic kidney disease: Secondary | ICD-10-CM | POA: Diagnosis not present

## 2018-04-07 DIAGNOSIS — K5730 Diverticulosis of large intestine without perforation or abscess without bleeding: Secondary | ICD-10-CM | POA: Diagnosis not present

## 2018-04-07 DIAGNOSIS — I872 Venous insufficiency (chronic) (peripheral): Secondary | ICD-10-CM | POA: Diagnosis not present

## 2018-04-07 DIAGNOSIS — N183 Chronic kidney disease, stage 3 (moderate): Secondary | ICD-10-CM | POA: Diagnosis not present

## 2018-04-07 DIAGNOSIS — D509 Iron deficiency anemia, unspecified: Secondary | ICD-10-CM | POA: Diagnosis not present

## 2018-04-07 DIAGNOSIS — K5520 Angiodysplasia of colon without hemorrhage: Secondary | ICD-10-CM | POA: Diagnosis not present

## 2018-04-09 ENCOUNTER — Telehealth: Payer: Self-pay | Admitting: Gastroenterology

## 2018-04-09 NOTE — Telephone Encounter (Signed)
Patient's daughter called that her mom is having black stool and not feeling well.  She was recently hospitalized with severe anemia.  Last week seen in office Hgb 11.6  Colonoscopy 03/23/18 with multiple ascending colon AVM She is oral iron Advised patient to come to ER as she is symptomatic with dizziness to exclude ongoing GI blood loss.   Damaris Hippo , MD [phone]

## 2018-04-11 ENCOUNTER — Emergency Department (HOSPITAL_COMMUNITY): Payer: Medicare Other

## 2018-04-11 ENCOUNTER — Other Ambulatory Visit: Payer: Self-pay

## 2018-04-11 ENCOUNTER — Telehealth: Payer: Self-pay | Admitting: Nurse Practitioner

## 2018-04-11 ENCOUNTER — Emergency Department (HOSPITAL_COMMUNITY)
Admission: EM | Admit: 2018-04-11 | Discharge: 2018-04-11 | Disposition: A | Discharge status: Home / Self Care | Payer: Medicare Other | Attending: Emergency Medicine | Admitting: Emergency Medicine

## 2018-04-11 ENCOUNTER — Telehealth: Payer: Self-pay | Admitting: Cardiovascular Disease

## 2018-04-11 DIAGNOSIS — R197 Diarrhea, unspecified: Secondary | ICD-10-CM | POA: Diagnosis not present

## 2018-04-11 DIAGNOSIS — K5730 Diverticulosis of large intestine without perforation or abscess without bleeding: Secondary | ICD-10-CM | POA: Diagnosis not present

## 2018-04-11 DIAGNOSIS — R1084 Generalized abdominal pain: Secondary | ICD-10-CM | POA: Diagnosis not present

## 2018-04-11 DIAGNOSIS — K649 Unspecified hemorrhoids: Secondary | ICD-10-CM | POA: Diagnosis not present

## 2018-04-11 DIAGNOSIS — Z87891 Personal history of nicotine dependence: Secondary | ICD-10-CM | POA: Insufficient documentation

## 2018-04-11 DIAGNOSIS — Z79899 Other long term (current) drug therapy: Secondary | ICD-10-CM | POA: Diagnosis not present

## 2018-04-11 DIAGNOSIS — I5032 Chronic diastolic (congestive) heart failure: Secondary | ICD-10-CM | POA: Diagnosis not present

## 2018-04-11 DIAGNOSIS — N183 Chronic kidney disease, stage 3 (moderate): Secondary | ICD-10-CM | POA: Insufficient documentation

## 2018-04-11 DIAGNOSIS — I130 Hypertensive heart and chronic kidney disease with heart failure and stage 1 through stage 4 chronic kidney disease, or unspecified chronic kidney disease: Secondary | ICD-10-CM | POA: Diagnosis not present

## 2018-04-11 LAB — COMPREHENSIVE METABOLIC PANEL
ALBUMIN: 3.3 g/dL — AB (ref 3.5–5.0)
ALT: 16 U/L (ref 0–44)
ANION GAP: 15 (ref 5–15)
AST: 29 U/L (ref 15–41)
Alkaline Phosphatase: 90 U/L (ref 38–126)
BUN: 39 mg/dL — ABNORMAL HIGH (ref 8–23)
CHLORIDE: 94 mmol/L — AB (ref 98–111)
CO2: 28 mmol/L (ref 22–32)
Calcium: 9.5 mg/dL (ref 8.9–10.3)
Creatinine, Ser: 2.06 mg/dL — ABNORMAL HIGH (ref 0.44–1.00)
GFR calc non Af Amer: 21 mL/min — ABNORMAL LOW (ref >60)
GFR, EST AFRICAN AMERICAN: 25 mL/min — AB (ref >60)
GLUCOSE: 124 mg/dL — AB (ref 70–99)
Potassium: 3.1 mmol/L — ABNORMAL LOW (ref 3.5–5.1)
SODIUM: 137 mmol/L (ref 135–145)
Total Bilirubin: 1.4 mg/dL — ABNORMAL HIGH (ref 0.3–1.2)
Total Protein: 6.1 g/dL — ABNORMAL LOW (ref 6.5–8.1)

## 2018-04-11 LAB — CBC
HEMATOCRIT: 40.4 % (ref 36.0–46.0)
HEMOGLOBIN: 12 g/dL (ref 12.0–15.0)
MCH: 25.6 pg — AB (ref 26.0–34.0)
MCHC: 29.7 g/dL — ABNORMAL LOW (ref 30.0–36.0)
MCV: 86.1 fL (ref 78.0–100.0)
Platelets: 742 10*3/uL — ABNORMAL HIGH (ref 150–400)
RBC: 4.69 MIL/uL (ref 3.87–5.11)
RDW: 26.9 % — ABNORMAL HIGH (ref 11.5–15.5)
WBC: 12.2 10*3/uL — ABNORMAL HIGH (ref 4.0–10.5)

## 2018-04-11 LAB — LIPASE, BLOOD: LIPASE: 63 U/L — AB (ref 11–51)

## 2018-04-11 LAB — POC OCCULT BLOOD, ED: Fecal Occult Bld: POSITIVE — AB

## 2018-04-11 MED ORDER — TRAMADOL HCL 50 MG PO TABS: 50.0000 mg | ORAL_TABLET | Freq: Four times a day (QID) | ORAL | 0 refills | Status: DC | PRN

## 2018-04-11 MED ORDER — POTASSIUM CHLORIDE CRYS ER 20 MEQ PO TBCR
40.0000 meq | EXTENDED_RELEASE_TABLET | Freq: Once | ORAL | Status: AC
  Administered 2018-04-11: 40 meq via ORAL
  Filled 2018-04-11: qty 2

## 2018-04-11 MED ORDER — SODIUM CHLORIDE 0.9 % IV BOLUS
1000.0000 mL | Freq: Once | INTRAVENOUS | Status: AC
  Administered 2018-04-11: 1000 mL via INTRAVENOUS

## 2018-04-11 NOTE — ED Provider Notes (Signed)
[facility] EMERGENCY DEPARTMENT Provider Note   CSN: [id number] Arrival date & time: 04/11/18  1631     History   Chief Complaint Chief Complaint  Patient presents with  . Abdominal Pain  . Diarrhea    HPI Chelsea Keller is a 83 y.o. female.  HPI   She is here for evaluation of diarrhea, characterized as "black,", multiple each day for several days.  Was preceded by abdominal pain for 3 weeks, intermittently, brief sounds.  These episodes occur multiple times each day.  7 days ago she had an episode of weakness, and almost fell.  She was recently hospitalized and had colonoscopy with polyps biopsied.  During hospitalization she was treated with blood transfusion and iron transfusion for severe anemia.  Patient and daughter do not know why she had low globin level.  She denies chest pain, shortness of breath, focal weakness or paresthesia.  She is taking her usual medications.  There are no other no modifying factors.  Past Medical History:  Diagnosis Date  . Allergy   . Anemia   . Anxiety   . Arthritis   . Arthritis of sacroiliac joint of both sides 11/12/2017  . Bilateral pulmonary embolism ([place]) 06/23/2017  . Chronic diastolic CHF (congestive heart failure) (HCC)    a. Echo 1/16:  mild LVH, EF normal, grade 1 DD, MAC  . Chronic venous insufficiency    chronic LE edema  . DDD (degenerative disc disease), lumbar 11/12/2017  . Degenerative joint disease (DJD) of hip, Bilateral  11/12/2017  . Depression   . Fibromyalgia    constant pain  . Hx of cardiac catheterization    a. LHC in [place] "ok" per patient with mild plaque in a single vessel - records not available  . Hx of cardiovascular stress test    a. Nuclear study in 2008 normal  . Hx of colonic polyps   . Hypertension   . Hypertriglyceridemia   . Impaired fasting glucose   . PONV (postoperative nausea and vomiting)   . PUD (peptic ulcer disease)    hx of gastric ulcer  . Pulmonary emboli ([place])  06/2017  . Vitamin B12 deficiency     Patient Active Problem List   Diagnosis Date Noted  . MDD (major depressive disorder), single episode, mild ([place]) 04/06/2018  . Acute gout 04/01/2018  . Angiodysplasia of colon   . Symptomatic anemia 03/21/2018  . Degenerative joint disease (DJD) of hip, Bilateral  11/12/2017  . DDD (degenerative disc disease), lumbar 11/12/2017  . Arthritis of sacroiliac joint of both sides 11/12/2017  . History of pulmonary embolism 11/07/2017  . Chronic anticoagulation 11/07/2017  . Diverticulitis of small intestine with perforation 11/07/2017  . Neurodermatitis 07/28/2017  . Urinary incontinence 05/26/2017  . Intractable pain-left hip 04/03/2017  . Fibromyalgia 04/03/2017  . Chronic diastolic CHF (congestive heart failure) ([place]) 04/03/2017  . Hypertension 04/03/2017  . Hypertriglyceridemia 04/03/2017  . Vitamin B12 deficiency 04/03/2017  . CKD (chronic kidney disease) stage 3, GFR 30-59 ml/min (HCC) 04/03/2017  . Left sided sciatica   . Advance directive discussed with patient 01/19/2017  . Chronic diastolic congestive heart failure ([place]) 05/09/2014  . Mood disorder ([place]) 11/03/2013  . Knee joint pain 10/31/2013  . Psoriasis 02/21/2013  . Routine general medical examination at a health care facility 06/14/2012  . BMI 50.0-59.9, adult ([place]) 12/02/2011  . VENTRICULAR HYPERTROPHY, LEFT 10/19/2008  . Sleep disturbance 10/25/2007  . Venous (peripheral) insufficiency 08/11/2007  . Osteoarthritis, multiple sites  08/11/2007  . Impaired fasting glucose 08/11/2007  . Hx of adenomatous colonic polyps 05/20/2007  . B12 DEFICIENCY 09/30/2006  . Essential hypertension 09/30/2006  . Allergic rhinitis due to pollen 09/30/2006    Past Surgical History:  Procedure Laterality Date  . ABDOMINAL HYSTERECTOMY    . CATARACT EXTRACTION  10/2003   OD  . CHOLECYSTECTOMY    . COLONOSCOPY W/ POLYPECTOMY    . COLONOSCOPY WITH PROPOFOL N/A 10/15/2014   Procedure:  COLONOSCOPY WITH PROPOFOL;  Surgeon: Ladene Artist, MD;  Location: [facility] ENDOSCOPY;  Service: Endoscopy;  Laterality: N/A;  . COLONOSCOPY WITH PROPOFOL N/A 03/23/2018   Procedure: COLONOSCOPY WITH PROPOFOL;  Surgeon: Gatha Mayer, MD;  Location: [facility] ENDOSCOPY;  Service: Endoscopy;  Laterality: N/A;  . ESOPHAGOGASTRODUODENOSCOPY (EGD) WITH PROPOFOL N/A 10/15/2014   Procedure: ESOPHAGOGASTRODUODENOSCOPY (EGD) WITH PROPOFOL;  Surgeon: Ladene Artist, MD;  Location: [facility] ENDOSCOPY;  Service: Endoscopy;  Laterality: N/A;  . EXTERNAL FIXATION ANKLE FRACTURE     Fx.  left ankle-fixation with pins later removed sec to infection 1985  . EXTERNAL FIXATION WRIST FRACTURE  1985   left with pins  . EYE SURGERY    . FEMUR FRACTURE SURGERY  06/2009  . FRACTURE SURGERY    . HARDWARE REMOVAL Left 10/05/2013   Procedure: HARDWARE REMOVAL LEFT DISTAL FEMUR;  Surgeon: Rozanna Box, MD;  Location: [facility];  Service: Orthopedics;  Laterality: Left;  . HEMIARTHROPLASTY SHOULDER FRACTURE  06/2009  . HOT HEMOSTASIS N/A 03/23/2018   Procedure: HOT HEMOSTASIS (ARGON PLASMA COAGULATION/BICAP);  Surgeon: Gatha Mayer, MD;  Location: [facility] ENDOSCOPY;  Service: Endoscopy;  Laterality: N/A;  . JOINT REPLACEMENT    . STERIOD INJECTION Right 10/05/2013   Procedure: STEROID INJECTION;  Surgeon: Rozanna Box, MD;  Location: [facility];  Service: Orthopedics;  Laterality: Right;  . TONSILLECTOMY    . TONSILLECTOMY    . TOTAL KNEE ARTHROPLASTY  03/09   left     OB History   None      Home Medications    Prior to Admission medications   Medication Sig Start Date End Date Taking? Authorizing Provider  acetaminophen (TYLENOL) 500 MG tablet Take 500 mg by mouth every 6 (six) hours as needed for headache (pain).    [provider]  apixaban (ELIQUIS) 5 MG TABS tablet Take 1 tablet (5 mg total) by mouth 2 (two) times daily. 09/14/17   Venia Carbon, MD  colchicine 0.6 MG tablet Take 1 tablet (0.6 mg total) by mouth 2  (two) times daily as needed. 04/01/18   Venia Carbon, MD  ferrous sulfate 325 (65 FE) MG tablet Take 1 tablet (325 mg total) by mouth daily. 03/24/18   Patrecia Pour, MD  furosemide (LASIX) 40 MG tablet Take 80 mg by mouth daily.    [provider]  gabapentin (NEURONTIN) 300 MG capsule Take 300 mg by mouth 2 (two) times daily.    [provider]  HYDROmorphone (DILAUDID) 2 MG tablet Take 1 tablet (2 mg total) by mouth every 6 (six) hours as needed for severe pain. 03/28/18   Robyn Haber, MD  polyethylene glycol powder (GLYCOLAX/MIRALAX) powder Take 17 g by mouth daily. Take as needed to produce 1 normal bowel movement per day. 03/24/18   Patrecia Pour, MD  potassium chloride SA (K-DUR,KLOR-CON) 20 MEQ tablet Take 20 mEq by mouth 2 (two) times daily.    [provider]  sertraline (ZOLOFT) 50 MG tablet Take 1  tablet (50 mg total) by mouth daily. 04/06/18   Venia Carbon, MD  traMADol (ULTRAM) 50 MG tablet Take 1 tablet (50 mg total) by mouth every 6 (six) hours as needed for moderate pain. 04/11/18   Daleen Bo, MD  triamterene-hydrochlorothiazide (MAXZIDE-25) 37.5-25 MG tablet Take 1 tablet by mouth daily.    [provider]    Family History Family History  Problem Relation Age of Onset  . Depression Mother   . Cancer Mother        uterine cancer  . Heart attack Father   . Cancer Brother        prostate cancer  . Diabetes Maternal Aunt   . Arthritis Brother   . Asthma Brother   . Stroke Maternal Grandmother   . Pulmonary embolism Daughter     Social History Social History   Tobacco Use  . Smoking status: Former Smoker    Packs/day: 1.00    Years: 40.00    Pack years: 40.00    Types: Cigarettes    Last attempt to quit: 07/06/1993    Years since quitting: 24.7  . Smokeless tobacco: Never Used  Substance Use Topics  . Alcohol use: No    Alcohol/week: 0.0 standard drinks  . Drug use: No     Allergies   Codeine sulfate;  Celecoxib; and Erythromycin base   Review of Systems Review of Systems  All other systems reviewed and are negative.    Physical Exam Updated Vital Signs BP 107/82   Pulse 76   Resp 16   Ht _0  (1.575 m)   Wt 106.1 kg   SpO2 99%   BMI 42.80 kg/m   Physical Exam  Constitutional: She is oriented to person, place, and time. She appears well-developed. She does not appear ill.  Obese  HENT:  Head: Normocephalic and atraumatic.  Eyes: Pupils are equal, round, and reactive to light. Conjunctivae and EOM are normal.  Neck: Normal range of motion and phonation normal. Neck supple.  Cardiovascular: Normal rate and regular rhythm.  Pulmonary/Chest: Effort normal and breath sounds normal. She exhibits no tenderness.  Abdominal: Soft. She exhibits no distension. There is tenderness (Diffuse, mild). There is no guarding.  Genitourinary:  Genitourinary Comments: Normal anus.  Small amount of thin green stool in rectal vault.  No rectal mass, no fecal impaction.  Musculoskeletal: Normal range of motion.  Neurological: She is alert and oriented to person, place, and time. She exhibits normal muscle tone.  Skin: Skin is warm and dry.  Psychiatric: She has a normal mood and affect. Her behavior is normal. Judgment and thought content normal.  Nursing note and vitals reviewed.    ED Treatments / Results  Labs (all labs ordered are listed, but only abnormal results are displayed) Labs Reviewed  LIPASE, BLOOD - Abnormal; Notable for the following components:      Result Value   Lipase 63 (*)    All other components within normal limits  COMPREHENSIVE METABOLIC PANEL - Abnormal; Notable for the following components:   Potassium 3.1 (*)    Chloride 94 (*)    Glucose, Bld 124 (*)    BUN 39 (*)    Creatinine, Ser 2.06 (*)    Total Protein 6.1 (*)    Albumin 3.3 (*)    Total Bilirubin 1.4 (*)    GFR calc non Af Amer 21 (*)    GFR calc Af Amer 25 (*)    All other components within  normal limits  CBC - Abnormal; Notable for the following components:   WBC 12.2 (*)    MCH 25.6 (*)    MCHC 29.7 (*)    RDW 26.9 (*)    Platelets 742 (*)    All other components within normal limits  POC OCCULT BLOOD, ED - Abnormal; Notable for the following components:   Fecal Occult Bld POSITIVE (*)    All other components within normal limits  URINALYSIS, ROUTINE W REFLEX MICROSCOPIC    EKG None  Radiology Ct Abdomen Pelvis Wo Contrast  Result Date: 04/11/2018 CLINICAL DATA:  Black tarry stools and anemia. Abdominal pain and vomiting. Colonoscopy 03/23/2018 showed non-bleeding angiodysplastic lesions treated with argon plasma coagulation; diverticulosis; and internal hemorrhoids. EXAM: CT ABDOMEN AND PELVIS WITHOUT CONTRAST TECHNIQUE: Multidetector CT imaging of the abdomen and pelvis was performed following the standard protocol without IV contrast. COMPARISON:  11/10/2017 FINDINGS: Lower chest: Right coronary artery atherosclerotic calcification. Mitral valve calcification. Hepatobiliary: Cholecystectomy. Pancreas: Unremarkable Spleen: Unremarkable Adrenals/Urinary Tract: 2.0 by 1.8 cm left adrenal nodule, internal density 13 Hounsfield units, nonspecific, but not significantly changed in size compared to earliest available comparison of 04/13/2013. 2 mm right kidney upper pole nonobstructive renal calculus on image 35/3. Stomach/Bowel: Sigmoid colon diverticulosis. No active diverticulitis identified. There are few scattered descending colon and transverse colon diverticula. Mild fatty prominence of the ileocecal valve. No dilated bowel. Vascular/Lymphatic: Aortoiliac atherosclerotic vascular disease. Reproductive: Uterus absent.  Adnexa unremarkable. Other: No supplemental non-categorized findings. Musculoskeletal: Chronic sclerosis along the iliac sides of the sacroiliac joints, with nitrogen gas phenomenon in both SI joints. Grade 1 degenerative anterolisthesis at L3-4 with multilevel  facet arthropathy in the lower lumbar spine likely leading to mild right foraminal impingement at L3-4, L4-5, and L5-S1. IMPRESSION: 1. A cause for the patient's suspected GI bleeding is not identified. There is evidence of sigmoid colon diverticulosis. 2. Other imaging findings of potential clinical significance: Aortic Atherosclerosis (ICD10-I70.0). Right coronary artery atherosclerotic calcification. Mitral valve calcification. Chronically stable left adrenal nodule, no change over the last 5 years, highly likely to be benign. Suspected mild osteitis condensans ilii. lower lumbar spondylosis and degenerative disc disease with mild right foraminal impingement at L3-4, L4-5, and L5-S1. Electronically Signed   By: Van Clines M.D.   On: 04/11/2018 21:24    Procedures Procedures (including critical care time)  Medications Ordered in ED Medications  potassium chloride SA (K-DUR,KLOR-CON) CR tablet 40 mEq (40 mEq Oral Given 04/11/18 2035)  sodium chloride 0.9 % bolus 1,000 mL (1,000 mLs Intravenous New Bag/Given 04/11/18 2054)     Initial Impression / Assessment and Plan / ED Course  I have reviewed the triage vital signs and the nursing notes.  Pertinent labs & imaging results that were available during my care of the patient were reviewed by me and considered in my medical decision making (see chart for details).  Clinical Course as of Apr 11 2156  Mon Apr 11, 2018  1947 Normal except white count 12.2, MCH low, platelets high  CBC(!) [EW]  1947 Except potassium low, chloride low, glucose high, BUN high, creatinine high, total protein low, abdomen low, total bilirubin high, GFR low  Comprehensive metabolic panel(!) [EW]  8756 Initial evaluation consistent with dehydration with abdominal creatinine, 0 diarrhea.  Hemoglobin is normal today.   [EW]  2038 Abnormal, positive  POC occult blood, ED Provider will collect(!) [EW]  2038 Mild elevation  Lipase, blood(!) [EW]  2145 No acute  abnormalities, images reviewed by me  CT ABDOMEN PELVIS WO CONTRAST [EW]    Clinical Course User Index [EW] Daleen Bo, MD     Patient Vitals for the past 24 hrs:  BP Pulse Resp SpO2 Height Weight  04/11/18 2037 - - - - _0  (1.575 m) 106.1 kg  04/11/18 2015 107/82 76 16 99 % - -  04/11/18 1945 131/62 72 18 98 % - -    9:52 PM Reevaluation with update and discussion. After initial assessment and treatment, an updated evaluation reveals no additional complaints.  She states that currently she is pain-free but is worried about the pain coming back.  She requests tramadol to have at home for recurrence of pain.  Findings discussed with the patient all questions were answered. Daleen Bo   Medical Decision Making: Nonspecific abdominal pain, with dark stool.  Guaiac positive green stool found on exam.  Hemoglobin normal 12.  No evidence first serious bacterial infection metabolic instability or impending vascular collapse.  Doubt acute intra-abdominal infection.  CRITICAL CARE-no Performed by: Daleen Bo  Nursing Notes Reviewed/ Care Coordinated Applicable Imaging Reviewed Interpretation of Laboratory Data incorporated into ED treatment  The patient appears reasonably screened and/or stabilized for discharge and I doubt any other medical condition or other [facility] requiring further screening, evaluation, or treatment in the ED at this time prior to discharge.  Plan: Home Medications-continue current medications; Home Treatments-advanced diet; return here if the recommended treatment, does not improve the symptoms; Recommended follow up-PCP follow-up within 1 week for further evaluation and treatment   Final Clinical Impressions(s) / ED Diagnoses   Final diagnoses:  Diarrhea, unspecified type  Generalized abdominal pain    ED Discharge Orders         Ordered    traMADol (ULTRAM) 50 MG tablet  Every 6 hours PRN     04/11/18 2155           Daleen Bo, MD 04/11/18  2157

## 2018-04-11 NOTE — Telephone Encounter (Signed)
Patient daughter calling stating pt is now vomiting and still having dark runny stools. Pt was advised to go to the ER per on call MD on 10.5.19. Patient daughter states pt refused to go to the hosp but wants to know what to do for patient now.

## 2018-04-11 NOTE — Telephone Encounter (Signed)
Number left is a [employer] with a voicemail.  Patient is not answering her home phone.

## 2018-04-11 NOTE — Telephone Encounter (Signed)
Daughter has the patient at [facility] ED now.

## 2018-04-11 NOTE — Telephone Encounter (Signed)
° ° ° °  Patient's daughter calling with concerns about dizziness and "slumping" over the weekend.  Patient refused to go to ED over the weekend.

## 2018-04-11 NOTE — ED Notes (Signed)
Patient transported to CT 

## 2018-04-11 NOTE — Telephone Encounter (Signed)
I spoke with pt's daughter. She reports pt feels horrible. Has been throwing up over the weekend. Had episode yesterday where she "zoned out" and slumped to the right.  Having loose black stools.  I advised daughter that pt should be evaluated in the ED. Daughter states she will take pt to the ED.

## 2018-04-11 NOTE — ED Notes (Signed)
Patient verbalizes understanding of discharge instructions. Opportunity for questioning and answers were provided. Armband removed by staff, pt discharged from ED in wheelchair.  

## 2018-04-11 NOTE — Discharge Instructions (Addendum)
The testing today is reassuring.  There were no serious findings found associated with your abdominal pain and diarrhea.  We sent a prescription to your pharmacy for you to take if needed for pain control.  Sure you are drinking plenty of fluids, and try to eat 3 meals a day.  For now, while you are having diarrhea, stay on a low fiber diet.  There was a small amount of blood seen in the stool today but your hemoglobin is normal, at 12.  Have your doctor check your blood count again next week.

## 2018-04-11 NOTE — ED Triage Notes (Signed)
Pt reports black diarrhea starting over the weekend. Pt reports colonoscopy several weeks ago, had several areas cauterized. Pt reports emesis starting last night and mid abdominal pain.

## 2018-04-12 DIAGNOSIS — I872 Venous insufficiency (chronic) (peripheral): Secondary | ICD-10-CM | POA: Diagnosis not present

## 2018-04-12 DIAGNOSIS — K5520 Angiodysplasia of colon without hemorrhage: Secondary | ICD-10-CM | POA: Diagnosis not present

## 2018-04-12 DIAGNOSIS — N183 Chronic kidney disease, stage 3 (moderate): Secondary | ICD-10-CM | POA: Diagnosis not present

## 2018-04-12 DIAGNOSIS — K5730 Diverticulosis of large intestine without perforation or abscess without bleeding: Secondary | ICD-10-CM | POA: Diagnosis not present

## 2018-04-12 DIAGNOSIS — I129 Hypertensive chronic kidney disease with stage 1 through stage 4 chronic kidney disease, or unspecified chronic kidney disease: Secondary | ICD-10-CM | POA: Diagnosis not present

## 2018-04-12 DIAGNOSIS — D509 Iron deficiency anemia, unspecified: Secondary | ICD-10-CM | POA: Diagnosis not present

## 2018-04-13 ENCOUNTER — Telehealth: Payer: Self-pay

## 2018-04-13 DIAGNOSIS — D519 Vitamin B12 deficiency anemia, unspecified: Secondary | ICD-10-CM

## 2018-04-13 DIAGNOSIS — Z86711 Personal history of pulmonary embolism: Secondary | ICD-10-CM | POA: Diagnosis not present

## 2018-04-13 DIAGNOSIS — K5730 Diverticulosis of large intestine without perforation or abscess without bleeding: Secondary | ICD-10-CM | POA: Diagnosis not present

## 2018-04-13 DIAGNOSIS — Z7901 Long term (current) use of anticoagulants: Secondary | ICD-10-CM | POA: Diagnosis not present

## 2018-04-13 DIAGNOSIS — E669 Obesity, unspecified: Secondary | ICD-10-CM | POA: Diagnosis not present

## 2018-04-13 DIAGNOSIS — D509 Iron deficiency anemia, unspecified: Secondary | ICD-10-CM | POA: Diagnosis not present

## 2018-04-13 DIAGNOSIS — K648 Other hemorrhoids: Secondary | ICD-10-CM | POA: Diagnosis not present

## 2018-04-13 DIAGNOSIS — Z79899 Other long term (current) drug therapy: Secondary | ICD-10-CM | POA: Diagnosis not present

## 2018-04-13 DIAGNOSIS — I872 Venous insufficiency (chronic) (peripheral): Secondary | ICD-10-CM

## 2018-04-13 DIAGNOSIS — N183 Chronic kidney disease, stage 3 (moderate): Secondary | ICD-10-CM

## 2018-04-13 DIAGNOSIS — I129 Hypertensive chronic kidney disease with stage 1 through stage 4 chronic kidney disease, or unspecified chronic kidney disease: Secondary | ICD-10-CM | POA: Diagnosis not present

## 2018-04-13 DIAGNOSIS — K5520 Angiodysplasia of colon without hemorrhage: Secondary | ICD-10-CM | POA: Diagnosis not present

## 2018-04-13 NOTE — Telephone Encounter (Signed)
Spoke to pt. Made appt 04-20-18.

## 2018-04-13 NOTE — Telephone Encounter (Signed)
Glad there wasn't evidence of another significant bleed but there was a change in kidney function that may be related to the lasix.  I would like to see her again soon---by next Wednesday at the latest (can add to the end of my schedule if needed)  Make sure she is keeping up with fluids (and may need to hold the lasix for 1-2 days if her edema is gone)

## 2018-04-14 ENCOUNTER — Ambulatory Visit (INDEPENDENT_AMBULATORY_CARE_PROVIDER_SITE_OTHER): Payer: Medicare Other | Admitting: Cardiovascular Disease

## 2018-04-14 ENCOUNTER — Encounter: Payer: Self-pay | Admitting: Cardiovascular Disease

## 2018-04-14 VITALS — BP 110/60 | HR 80 | Ht 62.0 in | Wt 230.4 lb

## 2018-04-14 DIAGNOSIS — I5032 Chronic diastolic (congestive) heart failure: Secondary | ICD-10-CM

## 2018-04-14 DIAGNOSIS — I10 Essential (primary) hypertension: Secondary | ICD-10-CM

## 2018-04-14 DIAGNOSIS — R42 Dizziness and giddiness: Secondary | ICD-10-CM | POA: Diagnosis not present

## 2018-04-14 MED ORDER — GABAPENTIN 300 MG PO CAPS: 300.0000 mg | ORAL_CAPSULE | Freq: Three times a day (TID) | ORAL | 3 refills | Status: DC

## 2018-04-14 NOTE — Patient Instructions (Signed)
Medication Instructions:  Your physician has recommended you make the following change in your medication: Hold furosemide for now   If you need a refill on your cardiac medications before your next appointment, please call your pharmacy.   Lab work: none If you have labs (blood work) drawn today and your tests are completely normal, you will receive your results only by: Marland Kitchen MyChart Message (if you have MyChart) OR . A paper copy in the mail If you have any lab test that is abnormal or we need to change your treatment, we will call you to review the results.  Testing/Procedures: Your physician has recommended that you wear a holter monitor. Holter monitors are medical devices that record the heart's electrical activity. Doctors most often use these monitors to diagnose arrhythmias. Arrhythmias are problems with the speed or rhythm of the heartbeat. The monitor is a small, portable device. You can wear one while you do your normal daily activities. This is usually used to diagnose what is causing palpitations/syncope (passing out).    Follow-Up: Your physician recommends that you schedule a follow-up appointment in: 3 months with PA or NP.

## 2018-04-14 NOTE — Progress Notes (Signed)
0  Chief Complaint  Patient presents with  . Follow-up    Chronic diastolic chf   History of Present Illness: 83 yo female with history of anemia with recent GI bleeding, chronic diastolic CHF, PE, obesity, HTN and chronic lower extremity edema here today for cardiac follow up. She has a remote history of tobacco abuse. She has had a normal myoview stress test in April of 2008. Her echo in May 2010 showed normal LV function and no significant valvular issues. I saw her in January 2016 and she c/o weakness, dizziness, dyspnea.  She was seen in primary care but chest x-ray was ok. She has chronic lower extremity edema and had been off of her Lasix. She endorsed a cough productive of clear sputum. She was admitted to [facility] for diuresis and aldactone was added. Echo with normal LV function in 2016. CTA chest negative for PE (elevated d-dimer). She was found to be anemic with Hgb of 8.7 (microcytic hypochromic). She was referred to GI and underwent EGD and colonoscopy 10/15/14. This showed gastritis, AVMs in the colon, polyps and divertculosis, hemorrhoids. Her chronic dyspnea is felt to be due to her obesity, deconditioning and anemia. Most recent echo in December 2018 with normal LV systolic function, grade 1 diastolic dysfunction and trivial AI. She was admitted to [facility] December 2018 with dyspnea and was found to have bilateral pulmonary emboli. She was started on Eliquis. She called our office 03/21/18 with c/o dyspnea, chest pressure and dizziness. We asked her to come into the ED. Chest x-ray was ok. She was found to have severe anemia (Hgb 4.7) felt to be due to active GI bleeding. She was found to have AVMs which were treated. She was transfused 4 units of pRBCs. Her symptoms resolved with resolution of her anemia. She was seen in the ED 04/11/18 with a GI illness with nausea and abdominal pain/dark stools. Blood counts were stable. CT abdomen without acute cause of abdominal pain.   She is here today for  follow up. The patient denies any chest pain, dyspnea, palpitations, lower extremity edema, orthopnea, PND, dizziness. She feels weak and has no energy. Her abdominal pain has improved. She has ongoing nausea but no vomiting over the last week. No episodes of her heart racing but she feels dizzy when she walks. Her lower extremity swelling has completely resolved.   Primary Care Physician: Venia Carbon, MD  Past Medical History:  Diagnosis Date  . Allergy   . Anemia   . Anxiety   . Arthritis   . Arthritis of sacroiliac joint of both sides 11/12/2017  . Bilateral pulmonary embolism ([place]) 06/23/2017  . Chronic diastolic CHF (congestive heart failure) (HCC)    a. Echo 1/16:  mild LVH, EF normal, grade 1 DD, MAC  . Chronic venous insufficiency    chronic LE edema  . DDD (degenerative disc disease), lumbar 11/12/2017  . Degenerative joint disease (DJD) of hip, Bilateral  11/12/2017  . Depression   . Fibromyalgia    constant pain  . Hx of cardiac catheterization    a. LHC in [place] "ok" per patient with mild plaque in a single vessel - records not available  . Hx of cardiovascular stress test    a. Nuclear study in 2008 normal  . Hx of colonic polyps   . Hypertension   . Hypertriglyceridemia   . Impaired fasting glucose   . PONV (postoperative nausea and vomiting)   . PUD (peptic ulcer disease)  hx of gastric ulcer  . Pulmonary emboli ([place]) 06/2017  . Vitamin B12 deficiency     Past Surgical History:  Procedure Laterality Date  . ABDOMINAL HYSTERECTOMY    . CATARACT EXTRACTION  10/2003   OD  . CHOLECYSTECTOMY    . COLONOSCOPY W/ POLYPECTOMY    . COLONOSCOPY WITH PROPOFOL N/A 10/15/2014   Procedure: COLONOSCOPY WITH PROPOFOL;  Surgeon: Ladene Artist, MD;  Location: [facility] ENDOSCOPY;  Service: Endoscopy;  Laterality: N/A;  . COLONOSCOPY WITH PROPOFOL N/A 03/23/2018   Procedure: COLONOSCOPY WITH PROPOFOL;  Surgeon: Gatha Mayer, MD;  Location: [facility] ENDOSCOPY;  Service: Endoscopy;   Laterality: N/A;  . ESOPHAGOGASTRODUODENOSCOPY (EGD) WITH PROPOFOL N/A 10/15/2014   Procedure: ESOPHAGOGASTRODUODENOSCOPY (EGD) WITH PROPOFOL;  Surgeon: Ladene Artist, MD;  [facility] ENDOSCOPY;  Service: Endoscopy;  Laterality: N/A;  . EXTERNAL FIXATION ANKLE FRACTURE     Fx.  left ankle-fixation with pins later removed sec to infection 1985  . EXTERNAL FIXATION WRIST FRACTURE  1985   left with pins  . EYE SURGERY    . FEMUR FRACTURE SURGERY  06/2009  . FRACTURE SURGERY    . HARDWARE REMOVAL Left 10/05/2013   Procedure: HARDWARE REMOVAL LEFT DISTAL FEMUR;  Surgeon: Rozanna Box, MD;  Location: [place];  Service: Orthopedics;  Laterality: Left;  . HEMIARTHROPLASTY SHOULDER FRACTURE  06/2009  . HOT HEMOSTASIS N/A 03/23/2018   Procedure: HOT HEMOSTASIS (ARGON PLASMA COAGULATION/BICAP);  Surgeon: Gatha Mayer, MD;  Location: [facility] ENDOSCOPY;  Service: Endoscopy;  Laterality: N/A;  . JOINT REPLACEMENT    . STERIOD INJECTION Right 10/05/2013   Procedure: STEROID INJECTION;  Surgeon: Rozanna Box, MD;  Location: [place];  Service: Orthopedics;  Laterality: Right;  . TONSILLECTOMY    . TONSILLECTOMY    . TOTAL KNEE ARTHROPLASTY  03/09   left    Current Outpatient Medications  Medication Sig Dispense Refill  . acetaminophen (TYLENOL) 500 MG tablet Take 500 mg by mouth every 6 (six) hours as needed for headache (pain).    Marland Kitchen apixaban (ELIQUIS) 5 MG TABS tablet Take 1 tablet (5 mg total) by mouth 2 (two) times daily. 30 tablet 0  . colchicine 0.6 MG tablet Take 1 tablet (0.6 mg total) by mouth 2 (two) times daily as needed. 60 tablet 3  . ferrous sulfate 325 (65 FE) MG tablet Take 1 tablet (325 mg total) by mouth daily. 30 tablet 0  . furosemide (LASIX) 40 MG tablet Take 80 mg by mouth daily.    Marland Kitchen gabapentin (NEURONTIN) 300 MG capsule Take 300 mg by mouth 3 (three) times daily.     Marland Kitchen HYDROmorphone (DILAUDID) 2 MG tablet Take 1 tablet (2 mg total) by mouth every 6 (six) hours as needed for severe  pain. 10 tablet 0  . polyethylene glycol powder (GLYCOLAX/MIRALAX) powder Take 17 g by mouth daily. Take as needed to produce 1 normal bowel movement per day. 250 g 0  . potassium chloride SA (K-DUR,KLOR-CON) 20 MEQ tablet Take 20 mEq by mouth 2 (two) times daily.    . sertraline (ZOLOFT) 25 MG tablet Take 25 mg by mouth daily.    . traMADol (ULTRAM) 50 MG tablet Take 1 tablet (50 mg total) by mouth every 6 (six) hours as needed for moderate pain. 15 tablet 0  . triamterene-hydrochlorothiazide (MAXZIDE-25) 37.5-25 MG tablet Take 1 tablet by mouth daily.     No current facility-administered medications for this visit.     Allergies  Allergen  Reactions  . Codeine Sulfate Shortness Of Breath  . Celecoxib Other (See Comments)    Caused vaginal bleeding  . Erythromycin Base Nausea And Vomiting    Social History   Socioeconomic History  . Marital status: Widowed    Spouse name: Not on file  . Number of children: 4  . Years of education: Not on file  . Highest education level: Not on file  Occupational History  . Occupation: retired crossing guard  . 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 Financial resource strain: Not on file  . Food insecurity:    Worry: Not on file    Inability: Not on file  . Transportation needs:    Medical: Not on file    Non-medical: Not on file  Tobacco Use  . Smoking status: Former Smoker    Packs/day: 1.00    Years: 40.00    Pack years: 40.00    Types: Cigarettes    Last attempt to quit: 07/06/1993    Years since quitting: 24.7  . Smokeless tobacco: Never Used  Substance and Sexual Activity  . Alcohol use: No    Alcohol/week: 0.0 standard drinks  . Drug use: No  . Sexual activity: Not on file  Lifestyle  . Physical activity:    Days per week: Not on file    Minutes per session: Not on file  . Stress: Not on file  Relationships  . Social connections:    Talks on phone: Not on file    Gets together: Not on file     Attends religious service: Not on file    Active member of club or organization: Not on file    Attends meetings of clubs or organizations: Not on file    Relationship status: Not on file  . Intimate partner violence:    Fear of current or ex partner: Not on file    Emotionally abused: Not on file    Physically abused: Not on file    Forced sexual activity: Not on file  Other Topics Concern  . 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 Depression Mother   . Cancer Mother        uterine cancer  . Heart attack Father   . Cancer Brother        prostate cancer  . Diabetes Maternal Aunt   . Arthritis Brother   . Asthma Brother   . Stroke Maternal Grandmother   . Pulmonary embolism Daughter     Review of Systems:  As stated in the HPI and otherwise negative.   BP 110/60   Pulse 80   Ht _0  (1.575 m)   Wt 230 lb 6.4 oz (104.5 kg)   SpO2 98%   BMI 42.14 kg/m   Physical Examination:  General: Well developed, well nourished, NAD  HEENT: OP clear, mucus membranes moist  SKIN: warm, dry. No rashes. Neuro: No focal deficits  Musculoskeletal: Muscle strength 5/5 all ext  Psychiatric: Mood and affect normal  Neck: No JVD, no carotid bruits,  no thyromegaly, no lymphadenopathy.  Lungs:Clear bilaterally, no wheezes, rhonci, crackles Cardiovascular: Regular rate and rhythm. No murmurs, gallops or rubs. Abdomen:Soft. Bowel sounds present. Non-tender.  Extremities: No lower extremity edema. Pulses are 2 + in the bilateral DP/PT.  Echo 06/23/17: - Left ventricle: The cavity size was normal. Systolic function was   normal.  The estimated ejection fraction was in the range of 55%   to 60%. Wall motion was normal; there were no regional wall   motion abnormalities. Doppler parameters are consistent with   abnormal left ventricular relaxation (grade 1 diastolic   dysfunction). - Aortic valve: There was trivial regurgitation. - Mitral valve: Calcified annulus. Mildly thickened leaflets . - Atrial septum: No defect or patent foramen ovale was identified. - Pulmonary arteries: PA peak pressure: 32 mm Hg (S).  EKG:  EKG is not  ordered today. The ekg ordered today demonstrates   Recent Labs: 11/06/2017: B Natriuretic Peptide 120.9 11/09/2017: Magnesium 1.8 04/11/2018: ALT 16; BUN 39; Creatinine, Ser 2.06; Hemoglobin 12.0; Platelets 742; Potassium 3.1; Sodium 137   Lipid Panel    Component Value Date/Time   CHOL 177 06/23/2017 1020   TRIG 80 06/23/2017 1020   HDL 44 06/23/2017 1020   CHOLHDL 4.0 06/23/2017 1020   VLDL 16 06/23/2017 1020   LDLCALC 117 (H) 06/23/2017 1020   LDLDIRECT 86.2 05/25/2008 1115     Wt Readings from Last 3 Encounters:  04/14/18 230 lb 6.4 oz (104.5 kg)  04/11/18 234 lb (106.1 kg)  04/01/18 236 lb (107 kg)     Other studies Reviewed: Additional studies/ records that were reviewed today include:  Review of the above records demonstrates:    Assessment and Plan:   1. Chronic diastolic CHF:. She has normal LV systolic function by echo in December 2018. She has grade 1 diastolic dysfunction.  Weight is stable. LE edema has resolved. Creatinine up to 2.0 in primary care 3 days ago. Lasix on hold. Repeat BMET next week in primary care office.    2. HTN: BP is well controlled.   3. Bilateral PE: Continue Eliquis.   4. Recent GI bleeding: H/H stable in the ED this week. GI bleeding due to [facility] and [facility] with [facility] September 2019.   5. Bilateral hand swelling and pain: Felt to be gout. Improved.   6. Dizziness: Will arrange 48 hour monitor to exclude arrhythmias. Her BP and Heart  rate are ok today. I suspect her dizziness is not cardiac related. She will hold Lasix as her LE edema has resolved. Recent BMET on 04/11/18 in primary care with creatinine 2.0. Plans for f/u with Dr. Silvio Pate.   Current medicines are reviewed at length with the patient today.  The patient does not have concerns regarding medicines.  The following changes have been made:  no change  Labs/ tests ordered today include:   Orders Placed This Encounter  Procedures  . HOLTER MONITOR - 48 HOUR   Disposition:   FU with me or care team APP in 3-4 months.    Signed, Lauree Chandler, MD 04/14/2018 12:06 PM    [address], [place], [place]  [address] Phone: [phone]; Fax: [phone]

## 2018-04-15 ENCOUNTER — Telehealth: Payer: Self-pay

## 2018-04-15 DIAGNOSIS — I129 Hypertensive chronic kidney disease with stage 1 through stage 4 chronic kidney disease, or unspecified chronic kidney disease: Secondary | ICD-10-CM | POA: Diagnosis not present

## 2018-04-15 DIAGNOSIS — K5730 Diverticulosis of large intestine without perforation or abscess without bleeding: Secondary | ICD-10-CM | POA: Diagnosis not present

## 2018-04-15 DIAGNOSIS — D509 Iron deficiency anemia, unspecified: Secondary | ICD-10-CM | POA: Diagnosis not present

## 2018-04-15 DIAGNOSIS — I872 Venous insufficiency (chronic) (peripheral): Secondary | ICD-10-CM | POA: Diagnosis not present

## 2018-04-15 DIAGNOSIS — K5520 Angiodysplasia of colon without hemorrhage: Secondary | ICD-10-CM | POA: Diagnosis not present

## 2018-04-15 DIAGNOSIS — N183 Chronic kidney disease, stage 3 (moderate): Secondary | ICD-10-CM | POA: Diagnosis not present

## 2018-04-15 NOTE — Telephone Encounter (Signed)
Pt has appt with Dr. Silvio Pate on 10/16... If symptoms worsening..she needs to be seen at urgent care.

## 2018-04-15 NOTE — Telephone Encounter (Signed)
Dr Silvio Pate does not return until 04/20/18.Please advise.

## 2018-04-15 NOTE — Telephone Encounter (Signed)
Copied from [place] [id number]. Topic: General - Other >> Apr 15, 2018  1:24 PM Chelsea Keller R wrote: Chelsea Keller from [facility] called in and stated that there is slow progess with PT patient is lacking a lot of motivation she is having extreme dizziness not eating with multiple bowel movements daily. She seems really depressed and lacking a lot of drive.  CB# [phone]

## 2018-04-15 NOTE — Telephone Encounter (Signed)
Chelsea Keller with [facility] notified as instructed by telephone.  She states she was just calling as an FYI to Dr. Silvio Pate.  I told her we would make sure that Dr. Silvio Pate gets the message.

## 2018-04-16 NOTE — Telephone Encounter (Signed)
I did start her on an antidepressant. Not clear if she is tolerating----certainly could need an increase Should be able to wait for the appt

## 2018-04-18 ENCOUNTER — Telehealth: Payer: Self-pay | Admitting: Nurse Practitioner

## 2018-04-18 DIAGNOSIS — K5730 Diverticulosis of large intestine without perforation or abscess without bleeding: Secondary | ICD-10-CM | POA: Diagnosis not present

## 2018-04-18 DIAGNOSIS — I129 Hypertensive chronic kidney disease with stage 1 through stage 4 chronic kidney disease, or unspecified chronic kidney disease: Secondary | ICD-10-CM | POA: Diagnosis not present

## 2018-04-18 DIAGNOSIS — D509 Iron deficiency anemia, unspecified: Secondary | ICD-10-CM | POA: Diagnosis not present

## 2018-04-18 DIAGNOSIS — N183 Chronic kidney disease, stage 3 (moderate): Secondary | ICD-10-CM | POA: Diagnosis not present

## 2018-04-18 DIAGNOSIS — K5520 Angiodysplasia of colon without hemorrhage: Secondary | ICD-10-CM | POA: Diagnosis not present

## 2018-04-18 DIAGNOSIS — I872 Venous insufficiency (chronic) (peripheral): Secondary | ICD-10-CM | POA: Diagnosis not present

## 2018-04-18 NOTE — Telephone Encounter (Signed)
Continues to have at least 8 dark, smelly watery bowel movements each day. These are urgent and incontinent stools. Eating causes stomach pain. Has tried just bland starchy foods. Cannot tolerate.  She refuses sports drinks and Pedialyte. Drinking water and sips of Pepsi. Daughter is very worried. This has been occurring "since the colonoscopy."

## 2018-04-18 NOTE — Telephone Encounter (Signed)
Advised. She will hold the iron supplement and colchicine. She is not taking Miralax. Keep the appointment with the PCP. Seek care urgently if she worsens. Discussed chicken broth cube, 4 cups of water, 1/4 tsp salt and 2 tablespoons sugar to try for hydration.

## 2018-04-18 NOTE — Telephone Encounter (Signed)
I did do her hospital consult and colonoscopy but she is a patient of Dr. Lynne Leader and her PCP is Dr. Silvio Pate (appointment 10/16)  She may be having side effects of ferrous sulfate and/or MiraLAx so should stop those and also stop colchiocine if taking any of these  If that does not work then needs an evaluation for infectious diarrhea - she could see an APP tomorrow if unable to last until 10/16 (or if she has stopped the meds I mentioned already) to see dr. Silvio Pate vs go to urgent care

## 2018-04-18 NOTE — Telephone Encounter (Signed)
Patients daughter states pt is still having dark runny stools, ever since her procedure on 9.18.19 at the hosp with Dr.Gessner. Patient daughter wanting advice on what to do now.

## 2018-04-20 ENCOUNTER — Encounter: Payer: Self-pay | Admitting: Internal Medicine

## 2018-04-20 ENCOUNTER — Ambulatory Visit (INDEPENDENT_AMBULATORY_CARE_PROVIDER_SITE_OTHER): Payer: Medicare Other | Admitting: Internal Medicine

## 2018-04-20 VITALS — BP 110/62 | HR 81 | Temp 97.6°F | Ht 62.0 in | Wt 228.0 lb

## 2018-04-20 DIAGNOSIS — R197 Diarrhea, unspecified: Secondary | ICD-10-CM

## 2018-04-20 DIAGNOSIS — I5032 Chronic diastolic (congestive) heart failure: Secondary | ICD-10-CM

## 2018-04-20 DIAGNOSIS — N183 Chronic kidney disease, stage 3 unspecified: Secondary | ICD-10-CM

## 2018-04-20 DIAGNOSIS — F320 Major depressive disorder, single episode, mild: Secondary | ICD-10-CM

## 2018-04-20 LAB — RENAL FUNCTION PANEL
Albumin: 3.5 g/dL (ref 3.5–5.2)
BUN: 37 mg/dL — ABNORMAL HIGH (ref 6–23)
CO2: 26 meq/L (ref 19–32)
Calcium: 9.4 mg/dL (ref 8.4–10.5)
Chloride: 102 mEq/L (ref 96–112)
Creatinine, Ser: 1.58 mg/dL — ABNORMAL HIGH (ref 0.40–1.20)
GFR: 33.32 mL/min — AB (ref >60.00)
Glucose, Bld: 120 mg/dL — ABNORMAL HIGH (ref 70–99)
POTASSIUM: 3.9 meq/L (ref 3.5–5.1)
Phosphorus: 3.5 mg/dL (ref 2.3–4.6)
Sodium: 139 mEq/L (ref 135–145)

## 2018-04-20 LAB — CBC
HEMATOCRIT: 35.1 % — AB (ref 36.0–46.0)
HEMOGLOBIN: 11.3 g/dL — AB (ref 12.0–15.0)
MCHC: 32.3 g/dL (ref 30.0–36.0)
MCV: 84.4 fl (ref 78.0–100.0)
PLATELETS: 287 10*3/uL (ref 150.0–400.0)
RBC: 4.16 Mil/uL (ref 3.87–5.11)
RDW: 28.5 % — ABNORMAL HIGH (ref 11.5–15.5)
WBC: 8.1 10*3/uL (ref 4.0–10.5)

## 2018-04-20 MED ORDER — TRIAMTERENE-HCTZ 37.5-25 MG PO TABS: 1.0000 | ORAL_TABLET | Freq: Every day | ORAL | 3 refills | Status: DC

## 2018-04-20 NOTE — Patient Instructions (Signed)
Only take the furosemide on days when your swelling gets bad again. (or your weight has gone up by 3# or more).

## 2018-04-20 NOTE — Assessment & Plan Note (Signed)
Ongoing Unsure if any side effects from the sertraline---will go up to the 50mg  dose and reevaluate

## 2018-04-20 NOTE — Progress Notes (Signed)
Subjective:    Patient ID: Chelsea Keller, female    DOB: Apr 17, 1935, 83 y.o.   MRN: [id number]  HPI Here with daughter----ER follow up from diarrhea Also, depression  Reviewed Dr Celesta Aver advice Diarrhea stopped when stopping iron, colchicine No stool in past day  Still only taking sertraline 75m  Doesn't "feel right"--she sleeps a lot (like even in church)t Taking the med at night--sleeps fine at night No apparent change in depression---and so much else is going on  Reviewed labs In ER---- CKD 3 had worsened (but acute illness) Weight is way down--edema gone Furosemide on hold  Still with home PT Has recommended RN follow up--not sure about the concern  Current Outpatient Medications on File Prior to Visit  Medication Sig Dispense Refill  . acetaminophen (TYLENOL) 500 MG tablet Take 500 mg by mouth every 6 (six) hours as needed for headache (pain).    .Marland Kitchenapixaban (ELIQUIS) 5 MG TABS tablet Take 1 tablet (5 mg total) by mouth 2 (two) times daily. 30 tablet 0  . gabapentin (NEURONTIN) 300 MG capsule Take 1 capsule (300 mg total) by mouth 3 (three) times daily. 270 capsule 3  . HYDROmorphone (DILAUDID) 2 MG tablet Take 1 tablet (2 mg total) by mouth every 6 (six) hours as needed for severe pain. 10 tablet 0  . polyethylene glycol powder (GLYCOLAX/MIRALAX) powder Take 17 g by mouth daily. Take as needed to produce 1 normal bowel movement per day. 250 g 0  . potassium chloride SA (K-DUR,KLOR-CON) 20 MEQ tablet Take 20 mEq by mouth 2 (two) times daily.    . sertraline (ZOLOFT) 25 MG tablet Take 25 mg by mouth daily.    . traMADol (ULTRAM) 50 MG tablet Take 1 tablet (50 mg total) by mouth every 6 (six) hours as needed for moderate pain. 15 tablet 0  . triamterene-hydrochlorothiazide (MAXZIDE-25) 37.5-25 MG tablet Take 1 tablet by mouth daily.    . colchicine 0.6 MG tablet Take 1 tablet (0.6 mg total) by mouth 2 (two) times daily as needed. (Patient not taking: Reported on 04/20/2018)  60 tablet 3  . ferrous sulfate 325 (65 FE) MG tablet Take 1 tablet (325 mg total) by mouth daily. (Patient not taking: Reported on 04/20/2018) 30 tablet 0  . furosemide (LASIX) 40 MG tablet Take 80 mg by mouth daily.     No current facility-administered medications on file prior to visit.     Allergies  Allergen Reactions  . Codeine Sulfate Shortness Of Breath  . Celecoxib Other (See Comments)    Caused vaginal bleeding  . Erythromycin Base Nausea And Vomiting    Past Medical History:  Diagnosis Date  . Allergy   . Anemia   . Anxiety   . Arthritis   . Arthritis of sacroiliac joint of both sides 11/12/2017  . Bilateral pulmonary embolism ([place] 06/23/2017  . Chronic diastolic CHF (congestive heart failure) (HCC)    a. Echo 1/16:  mild LVH, EF normal, grade 1 DD, MAC  . Chronic venous insufficiency    chronic LE edema  . DDD (degenerative disc disease), lumbar 11/12/2017  . Degenerative joint disease (DJD) of hip, Bilateral  11/12/2017  . Depression   . Fibromyalgia    constant pain  . Hx of cardiac catheterization    a. [place]ok[place] per patient with mild plaque in a single vessel - records not available  . Hx of cardiovascular stress test    a. Nuclear study in 2008 normal  .  Hx of colonic polyps   . Hypertension   . Hypertriglyceridemia   . Impaired fasting glucose   . PONV (postoperative nausea and vomiting)   . PUD (peptic ulcer disease)    hx of gastric ulcer  . Pulmonary emboli ([place]) 06/2017  . Vitamin B12 deficiency     Past Surgical History:  Procedure Laterality Date  . ABDOMINAL HYSTERECTOMY    . CATARACT EXTRACTION  10/2003   OD  . CHOLECYSTECTOMY    . COLONOSCOPY W/ POLYPECTOMY    . COLONOSCOPY WITH PROPOFOL N/A 10/15/2014   Procedure: COLONOSCOPY WITH PROPOFOL;  Surgeon: Ladene Artist, MD;  [facility] ENDOS[facility];  Service: Endoscopy;  Laterality: N/A;  . COLONOSCOPY WITH PROPOFOL N/A 03/23/2018   Procedure: COLONOSCOPY WITH PROPOFOL;  Surgeon:  Gatha Mayer, MD;  Location: [facility] ENDOSCOPY;  Service: Endoscopy;  Laterality: N/A;  . ESOPHAGOGASTRODUODENOSCOPY (EGD) WITH PROPOFOL N/A 10/15/2014   Procedure: ESOPHAGOGASTRODUODENOSCOPY (EGD) WITH PROPOFOL;  Surgeon: Ladene Artist, MD;  [facility] ENDOS[facility];  Service: Endoscopy;  Laterality: N/A;  . EXTERNAL FIXATION ANKLE FRACTURE     Fx.  left ankle-fixation with pins later removed sec to infection 1985  . EXTERNAL FIXATION WRIST FRACTURE  1985   left with pins  . EYE SURGERY    . FEMUR FRACTURE SURGERY  06/2009  . FRACTURE SURGERY    . HARDWARE REMOVAL Left 10/05/2013   Procedure: HARDWARE REMOVAL LEFT DISTAL FEMUR;  Surgeon: Rozanna Box, MD;  Location: [place];  Service: Orthopedics;  Laterality: Left;  . HEMIARTHROPLASTY SHOULDER FRACTURE  06/2009  . HOT HEMOSTASIS N/A 03/23/2018   Procedure: HOT HEMOSTASIS (ARGON PLASMA COAGULATION/BICAP);  Surgeon: Gatha Mayer, MD;  Location: [facility] ENDOSCOPY;  Service: Endoscopy;  Laterality: N/A;  . JOINT REPLACEMENT    . STERIOD INJECTION Right 10/05/2013   Procedure: STEROID INJECTION;  Surgeon: Rozanna Box, MD;  Location: [place];  Service: Orthopedics;  Laterality: Right;  . TONSILLECTOMY    . TONSILLECTOMY    . TOTAL KNEE ARTHROPLASTY  03/09   left    Family History  Problem Relation Age of Onset  . Depression Mother   . Cancer Mother        uterine cancer  . Heart attack Father   . Cancer Brother        prostate cancer  . Diabetes Maternal Aunt   . Arthritis Brother   . Asthma Brother   . Stroke Maternal Grandmother   . Pulmonary embolism Daughter     Social History   Socioeconomic History  . Marital status: Widowed    Spouse name: Not on file  . Number of children: 4  . Years of education: Not on file  . Highest education level: Not on file  Occupational History  . Occupation: retired crossing guard  . 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 Financial resource strain: Not on file  . Food  insecurity:    Worry: Not on file    Inability: Not on file  . Transportation needs:    Medical: Not on file    Non-medical: Not on file  Tobacco Use  . Smoking status: Former Smoker    Packs/day: 1.00    Years: 40.00    Pack years: 40.00    Types: Cigarettes    Last attempt to quit: 07/06/1993    Years since quitting: 24.8  . Smokeless tobacco: Never Used  Substance and Sexual Activity  . Alcohol use: No  Alcohol/week: 0.0 standard drinks  . Drug use: No  . Sexual activity: Not on file  Lifestyle  . Physical activity:    Days per week: Not on file    Minutes per session: Not on file  . Stress: Not on file  Relationships  . Social connections:    Talks on phone: Not on file    Gets together: Not on file    Attends religious service: Not on file    Active member of club or organization: Not on file    Attends meetings of clubs or organizations: Not on file    Relationship status: Not on file  . Intimate partner violence:    Fear of current or ex partner: Not on file    Emotionally abused: Not on file    Physically abused: Not on file    Forced sexual activity: Not on file  Other Topics Concern  . 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  Neck: No thyromegaly present.  Cardiovascular: Normal rate, regular rhythm and normal heart sounds. Exam reveals no gallop.  No murmur heard. Heart sounds somewhat distant  Respiratory: Effort normal and  breath sounds normal. No respiratory distress. She has no wheezes. She has no rales.  GI: Soft. There is no tenderness.  Lymphadenopathy:    She has no cervical adenopathy.  Psychiatric:  Still with psychomotor retardation and depression Appropriate conversation           Assessment & Plan:

## 2018-04-20 NOTE — Assessment & Plan Note (Signed)
Almost certainly from the colchicine Better now that she stopped it Gout is quiet---no tenderness or synovitis in hands (will stay off it)

## 2018-04-20 NOTE — Assessment & Plan Note (Signed)
GFR down to under 20 at ER visit Now off the furosemide---will recheck

## 2018-04-20 NOTE — Assessment & Plan Note (Signed)
Compensated now Weight is way down--not sure exactly why the severe edema has gone

## 2018-04-21 ENCOUNTER — Ambulatory Visit (INDEPENDENT_AMBULATORY_CARE_PROVIDER_SITE_OTHER): Payer: Medicare Other

## 2018-04-21 DIAGNOSIS — R42 Dizziness and giddiness: Secondary | ICD-10-CM | POA: Diagnosis not present

## 2018-04-22 DIAGNOSIS — D509 Iron deficiency anemia, unspecified: Secondary | ICD-10-CM | POA: Diagnosis not present

## 2018-04-22 DIAGNOSIS — K5520 Angiodysplasia of colon without hemorrhage: Secondary | ICD-10-CM | POA: Diagnosis not present

## 2018-04-22 DIAGNOSIS — N183 Chronic kidney disease, stage 3 (moderate): Secondary | ICD-10-CM | POA: Diagnosis not present

## 2018-04-22 DIAGNOSIS — I129 Hypertensive chronic kidney disease with stage 1 through stage 4 chronic kidney disease, or unspecified chronic kidney disease: Secondary | ICD-10-CM | POA: Diagnosis not present

## 2018-04-22 DIAGNOSIS — I872 Venous insufficiency (chronic) (peripheral): Secondary | ICD-10-CM | POA: Diagnosis not present

## 2018-04-22 DIAGNOSIS — K5730 Diverticulosis of large intestine without perforation or abscess without bleeding: Secondary | ICD-10-CM | POA: Diagnosis not present

## 2018-04-25 DIAGNOSIS — K5520 Angiodysplasia of colon without hemorrhage: Secondary | ICD-10-CM | POA: Diagnosis not present

## 2018-04-25 DIAGNOSIS — N183 Chronic kidney disease, stage 3 (moderate): Secondary | ICD-10-CM | POA: Diagnosis not present

## 2018-04-25 DIAGNOSIS — K5730 Diverticulosis of large intestine without perforation or abscess without bleeding: Secondary | ICD-10-CM | POA: Diagnosis not present

## 2018-04-25 DIAGNOSIS — I872 Venous insufficiency (chronic) (peripheral): Secondary | ICD-10-CM | POA: Diagnosis not present

## 2018-04-25 DIAGNOSIS — D509 Iron deficiency anemia, unspecified: Secondary | ICD-10-CM | POA: Diagnosis not present

## 2018-04-25 DIAGNOSIS — I129 Hypertensive chronic kidney disease with stage 1 through stage 4 chronic kidney disease, or unspecified chronic kidney disease: Secondary | ICD-10-CM | POA: Diagnosis not present

## 2018-04-28 ENCOUNTER — Telehealth: Payer: Self-pay | Admitting: Internal Medicine

## 2018-04-28 DIAGNOSIS — I872 Venous insufficiency (chronic) (peripheral): Secondary | ICD-10-CM | POA: Diagnosis not present

## 2018-04-28 DIAGNOSIS — K5730 Diverticulosis of large intestine without perforation or abscess without bleeding: Secondary | ICD-10-CM | POA: Diagnosis not present

## 2018-04-28 DIAGNOSIS — N183 Chronic kidney disease, stage 3 (moderate): Secondary | ICD-10-CM | POA: Diagnosis not present

## 2018-04-28 DIAGNOSIS — K5520 Angiodysplasia of colon without hemorrhage: Secondary | ICD-10-CM | POA: Diagnosis not present

## 2018-04-28 DIAGNOSIS — D509 Iron deficiency anemia, unspecified: Secondary | ICD-10-CM | POA: Diagnosis not present

## 2018-04-28 DIAGNOSIS — I129 Hypertensive chronic kidney disease with stage 1 through stage 4 chronic kidney disease, or unspecified chronic kidney disease: Secondary | ICD-10-CM | POA: Diagnosis not present

## 2018-04-28 NOTE — Telephone Encounter (Signed)
Copied from [place] [phone]. Topic: Quick Communication - Home Health Verbal Orders >> Apr 28, 2018  2:10 PM Lennox Solders wrote: Caller/Agency: [facility] Callback [place] Requesting physical therapy order to extend twice a wk for 2 wk and then once week for 1 wk

## 2018-04-28 NOTE — Telephone Encounter (Signed)
That is fine 

## 2018-04-28 NOTE — Telephone Encounter (Signed)
Verbal orders given to Stacy 

## 2018-05-03 DIAGNOSIS — K5520 Angiodysplasia of colon without hemorrhage: Secondary | ICD-10-CM | POA: Diagnosis not present

## 2018-05-03 DIAGNOSIS — I129 Hypertensive chronic kidney disease with stage 1 through stage 4 chronic kidney disease, or unspecified chronic kidney disease: Secondary | ICD-10-CM | POA: Diagnosis not present

## 2018-05-03 DIAGNOSIS — K5730 Diverticulosis of large intestine without perforation or abscess without bleeding: Secondary | ICD-10-CM | POA: Diagnosis not present

## 2018-05-03 DIAGNOSIS — N183 Chronic kidney disease, stage 3 (moderate): Secondary | ICD-10-CM | POA: Diagnosis not present

## 2018-05-03 DIAGNOSIS — I872 Venous insufficiency (chronic) (peripheral): Secondary | ICD-10-CM | POA: Diagnosis not present

## 2018-05-03 DIAGNOSIS — D509 Iron deficiency anemia, unspecified: Secondary | ICD-10-CM | POA: Diagnosis not present

## 2018-05-06 DIAGNOSIS — D509 Iron deficiency anemia, unspecified: Secondary | ICD-10-CM | POA: Diagnosis not present

## 2018-05-06 DIAGNOSIS — I129 Hypertensive chronic kidney disease with stage 1 through stage 4 chronic kidney disease, or unspecified chronic kidney disease: Secondary | ICD-10-CM | POA: Diagnosis not present

## 2018-05-06 DIAGNOSIS — N183 Chronic kidney disease, stage 3 (moderate): Secondary | ICD-10-CM | POA: Diagnosis not present

## 2018-05-06 DIAGNOSIS — K5520 Angiodysplasia of colon without hemorrhage: Secondary | ICD-10-CM | POA: Diagnosis not present

## 2018-05-06 DIAGNOSIS — I872 Venous insufficiency (chronic) (peripheral): Secondary | ICD-10-CM | POA: Diagnosis not present

## 2018-05-06 DIAGNOSIS — K5730 Diverticulosis of large intestine without perforation or abscess without bleeding: Secondary | ICD-10-CM | POA: Diagnosis not present

## 2018-05-09 ENCOUNTER — Ambulatory Visit: Payer: Medicare Other | Admitting: Internal Medicine

## 2018-05-10 DIAGNOSIS — N183 Chronic kidney disease, stage 3 (moderate): Secondary | ICD-10-CM | POA: Diagnosis not present

## 2018-05-10 DIAGNOSIS — I872 Venous insufficiency (chronic) (peripheral): Secondary | ICD-10-CM | POA: Diagnosis not present

## 2018-05-10 DIAGNOSIS — K5520 Angiodysplasia of colon without hemorrhage: Secondary | ICD-10-CM | POA: Diagnosis not present

## 2018-05-10 DIAGNOSIS — D509 Iron deficiency anemia, unspecified: Secondary | ICD-10-CM | POA: Diagnosis not present

## 2018-05-10 DIAGNOSIS — K5730 Diverticulosis of large intestine without perforation or abscess without bleeding: Secondary | ICD-10-CM | POA: Diagnosis not present

## 2018-05-10 DIAGNOSIS — I129 Hypertensive chronic kidney disease with stage 1 through stage 4 chronic kidney disease, or unspecified chronic kidney disease: Secondary | ICD-10-CM | POA: Diagnosis not present

## 2018-05-13 DIAGNOSIS — K5520 Angiodysplasia of colon without hemorrhage: Secondary | ICD-10-CM | POA: Diagnosis not present

## 2018-05-13 DIAGNOSIS — I129 Hypertensive chronic kidney disease with stage 1 through stage 4 chronic kidney disease, or unspecified chronic kidney disease: Secondary | ICD-10-CM | POA: Diagnosis not present

## 2018-05-13 DIAGNOSIS — D509 Iron deficiency anemia, unspecified: Secondary | ICD-10-CM | POA: Diagnosis not present

## 2018-05-13 DIAGNOSIS — K5730 Diverticulosis of large intestine without perforation or abscess without bleeding: Secondary | ICD-10-CM | POA: Diagnosis not present

## 2018-05-13 DIAGNOSIS — I872 Venous insufficiency (chronic) (peripheral): Secondary | ICD-10-CM | POA: Diagnosis not present

## 2018-05-13 DIAGNOSIS — N183 Chronic kidney disease, stage 3 (moderate): Secondary | ICD-10-CM | POA: Diagnosis not present

## 2018-05-20 DIAGNOSIS — K5730 Diverticulosis of large intestine without perforation or abscess without bleeding: Secondary | ICD-10-CM | POA: Diagnosis not present

## 2018-05-20 DIAGNOSIS — D509 Iron deficiency anemia, unspecified: Secondary | ICD-10-CM | POA: Diagnosis not present

## 2018-05-20 DIAGNOSIS — I129 Hypertensive chronic kidney disease with stage 1 through stage 4 chronic kidney disease, or unspecified chronic kidney disease: Secondary | ICD-10-CM | POA: Diagnosis not present

## 2018-05-20 DIAGNOSIS — N183 Chronic kidney disease, stage 3 (moderate): Secondary | ICD-10-CM | POA: Diagnosis not present

## 2018-05-20 DIAGNOSIS — I872 Venous insufficiency (chronic) (peripheral): Secondary | ICD-10-CM | POA: Diagnosis not present

## 2018-05-20 DIAGNOSIS — K5520 Angiodysplasia of colon without hemorrhage: Secondary | ICD-10-CM | POA: Diagnosis not present

## 2018-05-23 ENCOUNTER — Other Ambulatory Visit: Payer: Self-pay

## 2018-05-23 ENCOUNTER — Encounter: Payer: Self-pay | Admitting: Internal Medicine

## 2018-05-23 ENCOUNTER — Ambulatory Visit (INDEPENDENT_AMBULATORY_CARE_PROVIDER_SITE_OTHER): Payer: Medicare Other | Admitting: Internal Medicine

## 2018-05-23 VITALS — BP 136/66 | HR 82 | Temp 97.9°F | Ht 62.0 in | Wt 253.0 lb

## 2018-05-23 DIAGNOSIS — I5032 Chronic diastolic (congestive) heart failure: Secondary | ICD-10-CM

## 2018-05-23 DIAGNOSIS — F320 Major depressive disorder, single episode, mild: Secondary | ICD-10-CM | POA: Diagnosis not present

## 2018-05-23 NOTE — Patient Instructions (Signed)
Please weigh yourself every day. Take the lasix (furosemide) every day---unless your weight is under 230# at home

## 2018-05-23 NOTE — Assessment & Plan Note (Signed)
Seemed to improve off the sertraline No Rx for now

## 2018-05-23 NOTE — Assessment & Plan Note (Signed)
Has lost control with eating No obvious pulmonary fluid---but discussed daily weights and lasix daily (hold if under 230# at home)

## 2018-05-23 NOTE — Progress Notes (Signed)
Subjective:    Patient ID: Chelsea Keller, female    DOB: 1934/09/29, 83 y.o.   MRN: [id number]  HPI Here for follow up of depression and CHF--with daughter  [name] Miss she was depressed due to the zoloft--so stopped it about 2 weeks ago Actually felt better---has gone out to eat, and doing more  Weight is up 25# "I am eating like a horse" Has noticed some leg swelling again---is now back on the lasix Had been holding the lasix to go to therapy (done with this now) Has not been weighing Feels her breathing is fine  No chest pain No palpitations Has been more active with the therapy  Current Outpatient Medications on File Prior to Visit  Medication Sig Dispense Refill  . acetaminophen (TYLENOL) 500 MG tablet Take 500 mg by mouth every 6 (six) hours as needed for headache (pain).    Marland Kitchen apixaban (ELIQUIS) 5 MG TABS tablet Take 1 tablet (5 mg total) by mouth 2 (two) times daily. 30 tablet 0  . ferrous sulfate 325 (65 FE) MG tablet Take 1 tablet (325 mg total) by mouth daily. 30 tablet 0  . furosemide (LASIX) 40 MG tablet Take 80 mg by mouth daily.    Marland Kitchen gabapentin (NEURONTIN) 300 MG capsule Take 1 capsule (300 mg total) by mouth 3 (three) times daily. 270 capsule 3  . polyethylene glycol powder (GLYCOLAX/MIRALAX) powder Take 17 g by mouth daily. Take as needed to produce 1 normal bowel movement per day. 250 g 0  . potassium chloride SA (K-DUR,KLOR-CON) 20 MEQ tablet Take 20 mEq by mouth 2 (two) times daily.    . traMADol (ULTRAM) 50 MG tablet Take 1 tablet (50 mg total) by mouth every 6 (six) hours as needed for moderate pain. 15 tablet 0  . triamterene-hydrochlorothiazide (MAXZIDE-25) 37.5-25 MG tablet Take 1 tablet by mouth daily. 90 tablet 3   No current facility-administered medications on file prior to visit.     Allergies  Allergen Reactions  . Codeine Sulfate Shortness Of Breath  . Celecoxib Other (See Comments)    Caused vaginal bleeding  . Erythromycin Base Nausea And Vomiting      Past Medical History:  Diagnosis Date  . Allergy   . Anemia   . Anxiety   . Arthritis   . Arthritis of sacroiliac joint of both sides 11/12/2017  . Bilateral pulmonary embolism ([place]) 06/23/2017  . Chronic diastolic CHF (congestive heart failure) (HCC)    a. Echo 1/16:  mild LVH, EF normal, grade 1 DD, MAC  . Chronic venous insufficiency    chronic LE edema  . DDD (degenerative disc disease), lumbar 11/12/2017  . Degenerative joint disease (DJD) of hip, Bilateral  11/12/2017  . Depression   . Fibromyalgia    constant pain  . Hx of cardiac catheterization    a. LHC in [place] "ok" per patient with mild plaque in a single vessel - records not available  . Hx of cardiovascular stress test    a. Nuclear study in 2008 normal  . Hx of colonic polyps   . Hypertension   . Hypertriglyceridemia   . Impaired fasting glucose   . PONV (postoperative nausea and vomiting)   . PUD (peptic ulcer disease)    hx of gastric ulcer  . Pulmonary emboli ([place]) 06/2017  . Vitamin B12 deficiency     Past Surgical History:  Procedure Laterality Date  . ABDOMINAL HYSTERECTOMY    . CATARACT EXTRACTION  10/2003   OD  .  CHOLECYSTECTOMY    . COLONOSCOPY W/ POLYPECTOMY    . COLONOSCOPY WITH PROPOFOL N/A 10/15/2014   Procedure: COLONOSCOPY WITH PROPOFOL;  Surgeon: Ladene Artist, MD;  Location: [facility] ENDOSCOPY;  Service: Endoscopy;  Laterality: N/A;  . COLONOSCOPY WITH PROPOFOL N/A 03/23/2018   Procedure: COLONOSCOPY WITH PROPOFOL;  Surgeon: Gatha Mayer, MD;  Location: [facility] ENDOSCOPY;  Service: Endoscopy;  Laterality: N/A;  . ESOPHAGOGASTRODUODENOSCOPY (EGD) WITH PROPOFOL N/A 10/15/2014   Procedure: ESOPHAGOGASTRODUODENOSCOPY (EGD) WITH PROPOFOL;  Surgeon: Ladene Artist, MD;  Location: [facility] ENDOSCOPY;  Service: Endoscopy;  Laterality: N/A;  . EXTERNAL FIXATION ANKLE FRACTURE     Fx.  left ankle-fixation with pins later removed sec to infection 1985  . EXTERNAL FIXATION WRIST FRACTURE  1985   left with pins   . EYE SURGERY    . FEMUR FRACTURE SURGERY  06/2009  . FRACTURE SURGERY    . HARDWARE REMOVAL Left 10/05/2013   Procedure: HARDWARE REMOVAL LEFT DISTAL FEMUR;  Surgeon: Rozanna Box, MD;  Location: [place];  Service: Orthopedics;  Laterality: Left;  . HEMIARTHROPLASTY SHOULDER FRACTURE  06/2009  . HOT HEMOSTASIS N/A 03/23/2018   Procedure: HOT HEMOSTASIS (ARGON PLASMA COAGULATION/BICAP);  Surgeon: Gatha Mayer, MD;  Location: [facility] ENDOSCOPY;  Service: Endoscopy;  Laterality: N/A;  . JOINT REPLACEMENT    . STERIOD INJECTION Right 10/05/2013   Procedure: STEROID INJECTION;  Surgeon: Rozanna Box, MD;  Location: [place];  Service: Orthopedics;  Laterality: Right;  . TONSILLECTOMY    . TONSILLECTOMY    . TOTAL KNEE ARTHROPLASTY  03/09   left    Family History  Problem Relation Age of Onset  . Depression Mother   . Cancer Mother        uterine cancer  . Heart attack Father   . Cancer Brother        prostate cancer  . Diabetes Maternal Aunt   . Arthritis Brother   . Asthma Brother   . Stroke Maternal Grandmother   . Pulmonary embolism Daughter     Social History   Socioeconomic History  . Marital status: Widowed    Spouse name: Not on file  . Number of children: 4  . Years of education: Not on file  . Highest education level: Not on file  Occupational History  . Occupation: retired crossing guard  . 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 Financial resource strain: Not on file  . Food insecurity:    Worry: Not on file    Inability: Not on file  . Transportation needs:    Medical: Not on file    Non-medical: Not on file  Tobacco Use  . Smoking status: Former Smoker    Packs/day: 1.00    Years: 40.00    Pack years: 40.00    Types: Cigarettes    Last attempt to quit: 07/06/1993    Years since quitting: 24.8  . Smokeless tobacco: Never Used  Substance and Sexual Activity  . Alcohol use: No    Alcohol/week: 0.0 standard drinks  . Drug use: No  .  Sexual activity: Not on file  Lifestyle  . Physical activity:    Days per week: Not on file    Minutes per session: Not on file  . Stress: Not on file  Relationships  . Social connections:    Talks on phone: Not on file    Gets together: Not on file    Attends religious service: Not  on file    Active member of club or organization: Not on file    Attends meetings of clubs or organizations: Not on file    Relationship status: Not on file  . Intimate partner violence:    Fear of current or ex partner: Not on file    Emotionally abused: Not on file    Physically abused: Not on file    Forced sexual activity: Not on file  Other Topics Concern  . 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 No distress.  Neck: No thyromegaly present.  Cardiovascular: Normal rate, regular rhythm and normal heart sounds. Exam reveals no gallop.  No murmur heard. Respiratory: Effort normal and breath sounds normal. No respiratory distress. She has no wheezes. She has no rales.  Musculoskeletal: She exhibits no tenderness.  21+ tense edema in upper calves especially  Lymphadenopathy:    She has no cervical adenopathy.           Assessment & Plan:

## 2018-05-30 DIAGNOSIS — M48061 Spinal stenosis, lumbar region without neurogenic claudication: Secondary | ICD-10-CM | POA: Diagnosis not present

## 2018-05-30 DIAGNOSIS — M5136 Other intervertebral disc degeneration, lumbar region: Secondary | ICD-10-CM | POA: Diagnosis not present

## 2018-05-30 DIAGNOSIS — M1612 Unilateral primary osteoarthritis, left hip: Secondary | ICD-10-CM | POA: Diagnosis not present

## 2018-06-01 ENCOUNTER — Other Ambulatory Visit: Payer: Self-pay | Admitting: Internal Medicine

## 2018-06-13 ENCOUNTER — Other Ambulatory Visit: Payer: Self-pay | Admitting: Physical Medicine and Rehabilitation

## 2018-06-13 DIAGNOSIS — M1612 Unilateral primary osteoarthritis, left hip: Secondary | ICD-10-CM

## 2018-06-17 ENCOUNTER — Other Ambulatory Visit: Payer: Self-pay | Admitting: Physical Medicine and Rehabilitation

## 2018-06-17 DIAGNOSIS — M533 Sacrococcygeal disorders, not elsewhere classified: Secondary | ICD-10-CM

## 2018-06-28 ENCOUNTER — Ambulatory Visit
Admission: RE | Admit: 2018-06-28 | Discharge: 2018-06-28 | Disposition: A | Discharge status: Home / Self Care | Payer: Medicare Other | Source: Ambulatory Visit | Attending: Physical Medicine and Rehabilitation | Admitting: Physical Medicine and Rehabilitation

## 2018-06-28 DIAGNOSIS — M533 Sacrococcygeal disorders, not elsewhere classified: Secondary | ICD-10-CM

## 2018-06-28 MED ORDER — METHYLPREDNISOLONE ACETATE 40 MG/ML INJ SUSP (RADIOLOG
120.0000 mg | Freq: Once | INTRAMUSCULAR | Status: AC
  Administered 2018-06-28: 120 mg via INTRA_ARTICULAR

## 2018-06-28 MED ORDER — IOPAMIDOL (ISOVUE-M 200) INJECTION 41%
1.0000 mL | Freq: Once | INTRAMUSCULAR | Status: AC
  Administered 2018-06-28: 1 mL via INTRA_ARTICULAR

## 2018-06-28 NOTE — Discharge Instructions (Signed)

## 2018-07-04 ENCOUNTER — Other Ambulatory Visit: Payer: Self-pay | Admitting: Physical Medicine and Rehabilitation

## 2018-07-04 DIAGNOSIS — M533 Sacrococcygeal disorders, not elsewhere classified: Secondary | ICD-10-CM

## 2018-07-15 ENCOUNTER — Encounter: Payer: Self-pay | Admitting: Physician Assistant

## 2018-07-15 ENCOUNTER — Ambulatory Visit (INDEPENDENT_AMBULATORY_CARE_PROVIDER_SITE_OTHER): Payer: Medicare Other | Admitting: Physician Assistant

## 2018-07-15 VITALS — BP 110/58 | HR 84 | Ht 62.0 in | Wt 250.4 lb

## 2018-07-15 DIAGNOSIS — Z86711 Personal history of pulmonary embolism: Secondary | ICD-10-CM

## 2018-07-15 DIAGNOSIS — N183 Chronic kidney disease, stage 3 unspecified: Secondary | ICD-10-CM

## 2018-07-15 DIAGNOSIS — Z8719 Personal history of other diseases of the digestive system: Secondary | ICD-10-CM | POA: Diagnosis not present

## 2018-07-15 DIAGNOSIS — I5032 Chronic diastolic (congestive) heart failure: Secondary | ICD-10-CM | POA: Diagnosis not present

## 2018-07-15 DIAGNOSIS — I10 Essential (primary) hypertension: Secondary | ICD-10-CM

## 2018-07-15 NOTE — Patient Instructions (Signed)
Medication Instructions:  Your physician recommends that you continue on your current medications as directed. Please refer to the Current Medication list given to you today.  If you need a refill on your cardiac medications before your next appointment, please call your pharmacy.   Lab work: NONE  If you have labs (blood work) drawn today and your tests are completely normal, you will receive your results only by: Marland Kitchen MyChart Message (if you have MyChart) OR . A paper copy in the mail If you have any lab test that is abnormal or we need to change your treatment, we will call you to review the results.  Testing/Procedures: NONE  Follow-Up: At [facility], you and your health needs are our priority.  As part of our continuing mission to provide you with exceptional heart care, we have created designated Provider Care Teams.  These Care Teams include your primary Cardiologist (physician) and Advanced Practice Providers (APPs -  Physician Assistants and Nurse Practitioners) who all work together to provide you with the care you need, when you need it. You will need a follow up appointment in 6 months.  Please call our office 2 months in advance to schedule this appointment.  You may see Lauree Chandler, MD or one of the following Advanced Practice Providers on your designated Care Team:   [place], PA-C Melina Copa, PA-C . Ermalinda Barrios, PA-C  Any Other Special Instructions Will Be Listed Below (If Applicable). TRY TO WEAR COMPRESSION STOCKINGS TO HELP WITH SWELLING.

## 2018-07-15 NOTE — Progress Notes (Signed)
Cardiology Office Note:    Date:  07/15/2018   ID:  Chelsea Keller, DOB 03-Nov-1934, MRN [id number]  PCP:  Venia Carbon, MD  Cardiologist:  Lauree Chandler, MD   Electrophysiologist:  None   Referring MD: Venia Carbon, MD   Chief Complaint  Patient presents with  . Follow-up    CHF     History of Present Illness:    Chelsea Keller is a 84 y.o. female with heart failure with preserved ejection fraction, anemia secondary to prior GI bleeding, chronic lower extremity swelling, chronic dyspnea felt to be multifactorial, hypertension, prior pulmonary embolism, obesity.  Nuclear stress test in 2008 demonstrated no ischemia.  Echo in 12/18 demonstrated normal LVF and mild diastolic dysfunction.  She was diagnosed with bilateral pulmonary emboli in December 2018 and placed on Apixaban.  She was last seen by Dr. Angelena Form in October 2019.  She complained of dizziness.  Creatinine had recently increased to 2 and her Lasix was placed on hold.  A Holter monitor was obtained and demonstrated frequent PVCs (3%).  Diltiazem was recommended but she declined to start a new medication.    [name] returns for follow-up.  She is here with her daughter.  She still feels dizzy at times when she stands but this is much improved.  She denies palpitations.  She does not feel that she needs to take diltiazem.  She denies syncope.  She has not had any significant shortness of breath.  Although, she is fairly sedentary.  She denies chest pain, paroxysmal nocturnal dyspnea.  Lower extremity swelling is stable.  Prior CV studies:   The following studies were reviewed today:  48-hour Holter 04/21/2018 Sinus rhythm Frequent premature ventricular contractions (7852 during monitoring period-3%) Frequent premature atrial contractions (3648 during monitoring period).   Echocardiogram 06/23/2017 EF 55-60, normal wall motion, grade 1 diastolic dysfunction, trivial AI, MAC, PASP 32  Echocardiogram  08/04/14 - Mild concentric hypertrophy. Systolic function was normal. Wall motion was normal. Grade 1 diastolic dysfunction - Mitral valve: Calcified annulus.  Chest CTA 08/06/14 Limited exam due to body habitus and respiratory motion. No large or central pulmonary emboli identified as discussed above. Single minimally enlarged subcarinal lymph node.  Past Medical History:  Diagnosis Date  . Allergy   . Anemia   . Anxiety   . Arthritis   . Arthritis of sacroiliac joint of both sides 11/12/2017  . Bilateral pulmonary embolism ([place]) 06/23/2017  . Chronic diastolic CHF (congestive heart failure) (HCC)    a. Echo 1/16:  mild LVH, EF normal, grade 1 DD, MAC  . Chronic venous insufficiency    chronic LE edema  . DDD (degenerative disc disease), lumbar 11/12/2017  . Degenerative joint disease (DJD) of hip, Bilateral  11/12/2017  . Depression   . Fibromyalgia    constant pain  . Hx of cardiac catheterization    a. LHC in [place] "ok" per patient with mild plaque in a single vessel - records not available  . Hx of cardiovascular stress test    a. Nuclear study in 2008 normal  . Hx of colonic polyps   . Hypertension   . Hypertriglyceridemia   . Impaired fasting glucose   . PONV (postoperative nausea and vomiting)   . PUD (peptic ulcer disease)    hx of gastric ulcer  . Pulmonary emboli ([place]) 06/2017  . Vitamin B12 deficiency    Surgical Hx: The patient  has a past surgical history that includes Cholecystectomy; Tonsillectomy; External fixation  wrist fracture (1985); External fixation ankle fracture; Cataract extraction (10/2003); Abdominal hysterectomy; Hemiarthroplasty shoulder fracture (06/2009); Femur fracture surgery (06/2009); Total knee arthroplasty (03/09); Tonsillectomy; Fracture surgery; Eye surgery; Joint replacement; Colonoscopy w/ polypectomy; Hardware Removal (Left, 10/05/2013); Steriod injection (Right, 10/05/2013); Colonoscopy with propofol (N/A, 10/15/2014); Esophagogastroduodenoscopy  (egd) with propofol (N/A, 10/15/2014); Colonoscopy with propofol (N/A, 03/23/2018); and Hot hemostasis (N/A, 03/23/2018).   Current Medications: Current Meds  Medication Sig  . acetaminophen (TYLENOL) 500 MG tablet Take 500 mg by mouth every 6 (six) hours as needed for headache (pain).  Marland Kitchen apixaban (ELIQUIS) 5 MG TABS tablet Take 1 tablet (5 mg total) by mouth 2 (two) times daily.  . furosemide (LASIX) 80 MG tablet Take 40 mg by mouth daily.  Marland Kitchen gabapentin (NEURONTIN) 300 MG capsule Take 1 capsule (300 mg total) by mouth 3 (three) times daily.  . polyethylene glycol powder (GLYCOLAX/MIRALAX) powder Take 17 g by mouth daily. Take as needed to produce 1 normal bowel movement per day.  . potassium chloride SA (K-DUR,KLOR-CON) 20 MEQ tablet Take 20 mEq by mouth 2 (two) times daily.  . traMADol (ULTRAM) 50 MG tablet Take 1 tablet (50 mg total) by mouth every 6 (six) hours as needed for moderate pain.  Marland Kitchen triamterene-hydrochlorothiazide (MAXZIDE-25) 37.5-25 MG tablet Take 1 tablet by mouth daily.     Allergies:   Codeine sulfate; Celecoxib; and Erythromycin base   Social History   Tobacco Use  . Smoking status: Former Smoker    Packs/day: 1.00    Years: 40.00    Pack years: 40.00    Types: Cigarettes    Last attempt to quit: 07/06/1993    Years since quitting: 25.0  . Smokeless tobacco: Never Used  Substance Use Topics  . Alcohol use: No    Alcohol/week: 0.0 standard drinks  . Drug use: No     Family Hx: The patient's family history includes Arthritis in her brother; Asthma in her brother; Cancer in her brother and mother; Depression in her mother; Diabetes in her maternal aunt; Heart attack in her father; Pulmonary embolism in her daughter; Stroke in her maternal grandmother.  ROS:   Please see the history of present illness.    Review of Systems  Hematologic/Lymphatic: Bruises/bleeds easily.  Musculoskeletal: Positive for back pain.  Neurological: Positive for dizziness and loss of  balance.   All other systems reviewed and are negative.   EKGs/Labs/Other Test Reviewed:    EKG:  EKG is  ordered today.  The ekg ordered today demonstrates NSR, HR 84, low voltage, QTc 484, no change from prior tracing.   Recent Labs: 11/06/2017: B Natriuretic Peptide 120.9 11/09/2017: Magnesium 1.8 04/11/2018: ALT 16 04/20/2018: BUN 37; Creatinine, Ser 1.58; Hemoglobin 11.3; Platelets 287.0; Potassium 3.9; Sodium 139   Recent Lipid Panel Lab Results  Component Value Date/Time   CHOL 177 06/23/2017 10:20 AM   TRIG 80 06/23/2017 10:20 AM   HDL 44 06/23/2017 10:20 AM   CHOLHDL 4.0 06/23/2017 10:20 AM   LDLCALC 117 (H) 06/23/2017 10:20 AM   LDLDIRECT 86.2 05/25/2008 11:15 AM   From [employer] Tool: Hemoglobin 11.300 04/20/2018 Creatinine, Serum 1.580 04/20/2018 Potassium 3.900 04/20/2018   Physical Exam:    VS:  BP (!) 110/58   Pulse 84   Ht _0  (1.575 m)   Wt 250 lb 6.4 oz (113.6 kg)   SpO2 98%   BMI 45.80 kg/m     Wt Readings from Last 3 Encounters:  07/15/18 250 lb 6.4 oz (113.6 kg)  05/23/18 253 lb (114.8 kg)  04/20/18 228 lb (103.4 kg)     Physical Exam  Constitutional: She is oriented to person, place, and time. She appears well-developed and well-nourished. No distress.  HENT:  Head: Normocephalic and atraumatic.  Eyes: No scleral icterus.  Neck: No thyromegaly present.  Cardiovascular: Normal rate and regular rhythm.  Murmur heard.  Systolic murmur is present with a grade of 2/6 at the lower left sternal border. Pulmonary/Chest: Effort normal. She has no wheezes. She has no rales.  Abdominal: Soft.  Musculoskeletal:        General: Edema (tight, brawny 1+ bilat LE edema; multiple varicose veins noted) present.  Lymphadenopathy:    She has no cervical adenopathy.  Neurological: She is alert and oriented to person, place, and time.  Skin: Skin is warm and dry.  Psychiatric: She has a normal mood and affect.    ASSESSMENT & PLAN:    Chronic diastolic CHF  (congestive heart failure) (HCC) Overall, volume status stable.  Continue current dose of diuretic.   Essential hypertension  The patient's blood pressure is controlled on her current regimen.  Continue current therapy.    CKD (chronic kidney disease) stage 3, GFR 30-59 ml/min (HCC)  Most recent Creatinine improved.    History of pulmonary embolism  She remains on Apixaban.  She is due for follow up BMET, CBC but she prefers to get those with her PCP at follow up in 08/2018.  History of GI bleed  No symptoms of recurrence.  Dispo:  Return in about 6 months (around 01/13/2019) for Routine Follow Up with Dr. Angelena Form.   Medication Adjustments/Labs and Tests Ordered: Current medicines are reviewed at length with the patient today.  Concerns regarding medicines are outlined above.  Tests Ordered: Orders Placed This Encounter  Procedures  . EKG 12-Lead   Medication Changes: No orders of the defined types were placed in this encounter.   Signed, Richardson Dopp, PA-C  07/15/2018 1:29 PM    [facility] [address], [place], [place]  [address] Phone: [phone]; Fax: [phone]

## 2018-07-25 ENCOUNTER — Telehealth: Payer: Self-pay

## 2018-07-25 MED ORDER — GABAPENTIN 300 MG PO CAPS: 300.0000 mg | ORAL_CAPSULE | Freq: Three times a day (TID) | ORAL | 3 refills | Status: DC

## 2018-07-25 MED ORDER — TRAMADOL HCL 50 MG PO TABS: 50.0000 mg | ORAL_TABLET | Freq: Four times a day (QID) | ORAL | 0 refills | Status: DC | PRN

## 2018-07-25 NOTE — Telephone Encounter (Signed)
Spoke to pt's daughter per PDR about the form we received with recommendations to decrease the Eliquis to 2.5mg  due to her Renal Function and age. She will check with the pharmacist to see if they can be cut in half. If not, she will call for a new rx.

## 2018-08-03 ENCOUNTER — Other Ambulatory Visit: Payer: Self-pay | Admitting: Physical Medicine and Rehabilitation

## 2018-08-03 DIAGNOSIS — M1612 Unilateral primary osteoarthritis, left hip: Secondary | ICD-10-CM

## 2018-08-08 ENCOUNTER — Ambulatory Visit
Admission: RE | Admit: 2018-08-08 | Discharge: 2018-08-08 | Disposition: A | Discharge status: Home / Self Care | Payer: Medicare Other | Source: Ambulatory Visit | Attending: Physical Medicine and Rehabilitation | Admitting: Physical Medicine and Rehabilitation

## 2018-08-08 ENCOUNTER — Other Ambulatory Visit: Payer: Self-pay | Admitting: Physical Medicine and Rehabilitation

## 2018-08-08 DIAGNOSIS — M1611 Unilateral primary osteoarthritis, right hip: Secondary | ICD-10-CM

## 2018-08-08 DIAGNOSIS — M1612 Unilateral primary osteoarthritis, left hip: Secondary | ICD-10-CM

## 2018-08-08 MED ORDER — METHYLPREDNISOLONE ACETATE 40 MG/ML INJ SUSP (RADIOLOG: 120.0000 mg | Freq: Once | INTRAMUSCULAR | Status: DC

## 2018-08-08 MED ORDER — IOPAMIDOL (ISOVUE-M 200) INJECTION 41%: 1.0000 mL | Freq: Once | INTRAMUSCULAR | Status: DC

## 2018-08-08 NOTE — Discharge Instructions (Signed)

## 2018-08-09 NOTE — Telephone Encounter (Signed)
Please call [name] and offer my last same day on Thursday

## 2018-08-09 NOTE — Telephone Encounter (Signed)
We have no Same Day openings until Friday. Not sure what Dr Silvio Pate wants to do.

## 2018-08-11 ENCOUNTER — Ambulatory Visit (INDEPENDENT_AMBULATORY_CARE_PROVIDER_SITE_OTHER): Payer: Medicare Other | Admitting: Internal Medicine

## 2018-08-11 ENCOUNTER — Inpatient Hospital Stay (HOSPITAL_COMMUNITY)
Admission: EM | Admit: 2018-08-11 | Discharge: 2018-08-16 | DRG: 811 | Disposition: A | Discharge status: Home Health | Payer: Medicare Other | Attending: Internal Medicine | Admitting: Internal Medicine

## 2018-08-11 ENCOUNTER — Encounter: Payer: Self-pay | Admitting: Internal Medicine

## 2018-08-11 ENCOUNTER — Other Ambulatory Visit: Payer: Self-pay

## 2018-08-11 ENCOUNTER — Telehealth: Payer: Self-pay | Admitting: Radiology

## 2018-08-11 ENCOUNTER — Emergency Department (HOSPITAL_COMMUNITY): Payer: Medicare Other

## 2018-08-11 ENCOUNTER — Encounter (HOSPITAL_COMMUNITY): Payer: Self-pay | Admitting: *Deleted

## 2018-08-11 VITALS — BP 130/60 | HR 85 | Temp 97.8°F | Ht 62.0 in | Wt 259.0 lb

## 2018-08-11 DIAGNOSIS — M533 Sacrococcygeal disorders, not elsewhere classified: Secondary | ICD-10-CM | POA: Diagnosis not present

## 2018-08-11 DIAGNOSIS — Z8249 Family history of ischemic heart disease and other diseases of the circulatory system: Secondary | ICD-10-CM | POA: Diagnosis not present

## 2018-08-11 DIAGNOSIS — Z6841 Body Mass Index (BMI) 40.0 and over, adult: Secondary | ICD-10-CM | POA: Diagnosis not present

## 2018-08-11 DIAGNOSIS — Z9114 Patient's other noncompliance with medication regimen: Secondary | ICD-10-CM

## 2018-08-11 DIAGNOSIS — Z823 Family history of stroke: Secondary | ICD-10-CM

## 2018-08-11 DIAGNOSIS — I5033 Acute on chronic diastolic (congestive) heart failure: Secondary | ICD-10-CM

## 2018-08-11 DIAGNOSIS — D649 Anemia, unspecified: Secondary | ICD-10-CM

## 2018-08-11 DIAGNOSIS — M545 Low back pain: Secondary | ICD-10-CM | POA: Diagnosis present

## 2018-08-11 DIAGNOSIS — M25551 Pain in right hip: Secondary | ICD-10-CM

## 2018-08-11 DIAGNOSIS — M47898 Other spondylosis, sacral and sacrococcygeal region: Secondary | ICD-10-CM | POA: Diagnosis present

## 2018-08-11 DIAGNOSIS — Z96652 Presence of left artificial knee joint: Secondary | ICD-10-CM | POA: Diagnosis present

## 2018-08-11 DIAGNOSIS — D62 Acute posthemorrhagic anemia: Secondary | ICD-10-CM | POA: Diagnosis not present

## 2018-08-11 DIAGNOSIS — T380X5A Adverse effect of glucocorticoids and synthetic analogues, initial encounter: Secondary | ICD-10-CM | POA: Diagnosis present

## 2018-08-11 DIAGNOSIS — N183 Chronic kidney disease, stage 3 unspecified: Secondary | ICD-10-CM

## 2018-08-11 DIAGNOSIS — Z86711 Personal history of pulmonary embolism: Secondary | ICD-10-CM | POA: Diagnosis not present

## 2018-08-11 DIAGNOSIS — Z7901 Long term (current) use of anticoagulants: Secondary | ICD-10-CM

## 2018-08-11 DIAGNOSIS — D72828 Other elevated white blood cell count: Secondary | ICD-10-CM | POA: Diagnosis present

## 2018-08-11 DIAGNOSIS — Z8049 Family history of malignant neoplasm of other genital organs: Secondary | ICD-10-CM | POA: Diagnosis not present

## 2018-08-11 DIAGNOSIS — Z825 Family history of asthma and other chronic lower respiratory diseases: Secondary | ICD-10-CM | POA: Diagnosis not present

## 2018-08-11 DIAGNOSIS — Z833 Family history of diabetes mellitus: Secondary | ICD-10-CM | POA: Diagnosis not present

## 2018-08-11 DIAGNOSIS — D500 Iron deficiency anemia secondary to blood loss (chronic): Secondary | ICD-10-CM | POA: Diagnosis not present

## 2018-08-11 DIAGNOSIS — Z818 Family history of other mental and behavioral disorders: Secondary | ICD-10-CM | POA: Diagnosis not present

## 2018-08-11 DIAGNOSIS — I110 Hypertensive heart disease with heart failure: Secondary | ICD-10-CM | POA: Diagnosis present

## 2018-08-11 DIAGNOSIS — M461 Sacroiliitis, not elsewhere classified: Secondary | ICD-10-CM | POA: Diagnosis present

## 2018-08-11 DIAGNOSIS — M25552 Pain in left hip: Secondary | ICD-10-CM

## 2018-08-11 DIAGNOSIS — R0602 Shortness of breath: Secondary | ICD-10-CM | POA: Diagnosis not present

## 2018-08-11 DIAGNOSIS — R42 Dizziness and giddiness: Secondary | ICD-10-CM | POA: Diagnosis not present

## 2018-08-11 DIAGNOSIS — K922 Gastrointestinal hemorrhage, unspecified: Secondary | ICD-10-CM

## 2018-08-11 DIAGNOSIS — I5032 Chronic diastolic (congestive) heart failure: Secondary | ICD-10-CM | POA: Diagnosis present

## 2018-08-11 DIAGNOSIS — M109 Gout, unspecified: Secondary | ICD-10-CM | POA: Diagnosis present

## 2018-08-11 DIAGNOSIS — K5521 Angiodysplasia of colon with hemorrhage: Secondary | ICD-10-CM | POA: Diagnosis present

## 2018-08-11 DIAGNOSIS — M797 Fibromyalgia: Secondary | ICD-10-CM | POA: Diagnosis present

## 2018-08-11 DIAGNOSIS — F39 Unspecified mood [affective] disorder: Secondary | ICD-10-CM

## 2018-08-11 DIAGNOSIS — K648 Other hemorrhoids: Secondary | ICD-10-CM | POA: Diagnosis present

## 2018-08-11 DIAGNOSIS — R102 Pelvic and perineal pain: Secondary | ICD-10-CM | POA: Diagnosis not present

## 2018-08-11 DIAGNOSIS — E8770 Fluid overload, unspecified: Secondary | ICD-10-CM | POA: Diagnosis not present

## 2018-08-11 DIAGNOSIS — Z87891 Personal history of nicotine dependence: Secondary | ICD-10-CM | POA: Diagnosis not present

## 2018-08-11 DIAGNOSIS — I491 Atrial premature depolarization: Secondary | ICD-10-CM | POA: Diagnosis not present

## 2018-08-11 DIAGNOSIS — E876 Hypokalemia: Secondary | ICD-10-CM | POA: Diagnosis present

## 2018-08-11 LAB — CBC
HCT: 17.4 % — CL (ref 36.0–46.0)
HCT: 19.6 % — ABNORMAL LOW (ref 36.0–46.0)
HCT: 18.9 % — ABNORMAL LOW (ref 36.0–46.0)
HEMOGLOBIN: 5 g/dL — AB (ref 12.0–15.0)
Hemoglobin: 5.1 g/dL — CL (ref 12.0–15.0)
Hemoglobin: 4.9 g/dL — CL (ref 12.0–15.0)
MCH: 19.2 pg — ABNORMAL LOW (ref 26.0–34.0)
MCH: 19.4 pg — AB (ref 26.0–34.0)
MCHC: 29.1 g/dL — ABNORMAL LOW (ref 30.0–36.0)
MCHC: 25.5 g/dL — ABNORMAL LOW (ref 30.0–36.0)
MCHC: 25.9 g/dL — AB (ref 30.0–36.0)
MCV: 66.3 fl — ABNORMAL LOW (ref 78.0–100.0)
MCV: 75.1 fL — ABNORMAL LOW (ref 80.0–100.0)
MCV: 74.7 fL — ABNORMAL LOW (ref 80.0–100.0)
NRBC: 0.5 % — AB (ref 0.0–0.2)
Platelets: 346 10*3/uL (ref 150.0–400.0)
Platelets: 335 10*3/uL (ref 150–400)
Platelets: 328 10*3/uL (ref 150–400)
RBC: 2.62 Mil/uL — ABNORMAL LOW (ref 3.87–5.11)
RBC: 2.61 MIL/uL — AB (ref 3.87–5.11)
RBC: 2.53 MIL/uL — ABNORMAL LOW (ref 3.87–5.11)
RDW: 20.4 % — ABNORMAL HIGH (ref 11.5–15.5)
RDW: 18.7 % — ABNORMAL HIGH (ref 11.5–15.5)
RDW: 19 % — ABNORMAL HIGH (ref 11.5–15.5)
WBC: 9.9 10*3/uL (ref 4.0–10.5)
WBC: 10.8 10*3/uL — ABNORMAL HIGH (ref 4.0–10.5)
WBC: 10.9 10*3/uL — ABNORMAL HIGH (ref 4.0–10.5)
nRBC: 0.5 % — ABNORMAL HIGH (ref 0.0–0.2)

## 2018-08-11 LAB — RENAL FUNCTION PANEL
Albumin: 3.5 g/dL (ref 3.5–5.2)
BUN: 31 mg/dL — ABNORMAL HIGH (ref 6–23)
CO2: 27 meq/L (ref 19–32)
Calcium: 8.5 mg/dL (ref 8.4–10.5)
Chloride: 99 mEq/L (ref 96–112)
Creatinine, Ser: 1.12 mg/dL (ref 0.40–1.20)
GFR: 46.6 mL/min — ABNORMAL LOW (ref >60.00)
Glucose, Bld: 92 mg/dL (ref 70–99)
Phosphorus: 4 mg/dL (ref 2.3–4.6)
Potassium: 3.6 mEq/L (ref 3.5–5.1)
Sodium: 138 mEq/L (ref 135–145)

## 2018-08-11 LAB — I-STAT TROPONIN, ED: Troponin i, poc: 0.03 ng/mL (ref 0.00–0.08)

## 2018-08-11 LAB — MAGNESIUM: Magnesium: 2.1 mg/dL (ref 1.7–2.4)

## 2018-08-11 LAB — LACTATE DEHYDROGENASE: LDH: 172 U/L (ref 98–192)

## 2018-08-11 LAB — FERRITIN: Ferritin: 2 ng/mL — ABNORMAL LOW (ref 11–307)

## 2018-08-11 LAB — POC OCCULT BLOOD, ED: Fecal Occult Bld: POSITIVE — AB

## 2018-08-11 LAB — IRON AND TIBC
Iron: 11 ug/dL — ABNORMAL LOW (ref 28–170)
Saturation Ratios: 2 % — ABNORMAL LOW (ref 10.4–31.8)
TIBC: 456 ug/dL — ABNORMAL HIGH (ref 250–450)
UIBC: 445 ug/dL

## 2018-08-11 LAB — PREPARE RBC (CROSSMATCH)

## 2018-08-11 LAB — RETICULOCYTES
Immature Retic Fract: 27.4 % — ABNORMAL HIGH (ref 2.3–15.9)
RBC.: 2.53 MIL/uL — ABNORMAL LOW (ref 3.87–5.11)
Retic Count, Absolute: 85.5 10*3/uL (ref 19.0–186.0)
Retic Ct Pct: 3.4 % — ABNORMAL HIGH (ref 0.4–3.1)

## 2018-08-11 LAB — PROTIME-INR
INR: 1.66
Prothrombin Time: 19.4 seconds — ABNORMAL HIGH (ref 11.4–15.2)

## 2018-08-11 LAB — APTT: APTT: 34 s (ref 24–36)

## 2018-08-11 LAB — VITAMIN B12: Vitamin B-12: 477 pg/mL (ref 180–914)

## 2018-08-11 LAB — BRAIN NATRIURETIC PEPTIDE: B Natriuretic Peptide: 112.4 pg/mL — ABNORMAL HIGH (ref 0.0–100.0)

## 2018-08-11 LAB — PHOSPHORUS: Phosphorus: 3.7 mg/dL (ref 2.5–4.6)

## 2018-08-11 MED ORDER — GABAPENTIN 300 MG PO CAPS
600.0000 mg | ORAL_CAPSULE | Freq: Every day | ORAL | Status: DC
  Administered 2018-08-12 – 2018-08-15 (×5): 600 mg via ORAL
  Filled 2018-08-11 (×6): qty 2

## 2018-08-11 MED ORDER — TRAMADOL HCL 50 MG PO TABS
50.0000 mg | ORAL_TABLET | Freq: Two times a day (BID) | ORAL | Status: DC | PRN
  Administered 2018-08-12 – 2018-08-13 (×3): 50 mg via ORAL
  Filled 2018-08-11 (×3): qty 1

## 2018-08-11 MED ORDER — ONDANSETRON HCL 4 MG/2ML IJ SOLN: 4.0000 mg | Freq: Four times a day (QID) | INTRAMUSCULAR | Status: DC | PRN

## 2018-08-11 MED ORDER — ACETAMINOPHEN 325 MG PO TABS
650.0000 mg | ORAL_TABLET | Freq: Four times a day (QID) | ORAL | Status: DC | PRN
  Administered 2018-08-12 – 2018-08-15 (×5): 650 mg via ORAL
  Filled 2018-08-11 (×5): qty 2

## 2018-08-11 MED ORDER — ONDANSETRON HCL 4 MG PO TABS: 4.0000 mg | ORAL_TABLET | Freq: Four times a day (QID) | ORAL | Status: DC | PRN

## 2018-08-11 MED ORDER — PANTOPRAZOLE SODIUM 40 MG IV SOLR
40.0000 mg | Freq: Two times a day (BID) | INTRAVENOUS | Status: DC
  Administered 2018-08-12 (×2): 40 mg via INTRAVENOUS
  Filled 2018-08-11 (×2): qty 40

## 2018-08-11 MED ORDER — GABAPENTIN 300 MG PO CAPS
300.0000 mg | ORAL_CAPSULE | Freq: Every day | ORAL | Status: DC
  Administered 2018-08-12 – 2018-08-16 (×5): 300 mg via ORAL
  Filled 2018-08-11 (×5): qty 1

## 2018-08-11 MED ORDER — SODIUM CHLORIDE 0.9 % IV SOLN
10.0000 mL/h | Freq: Once | INTRAVENOUS | Status: AC
  Administered 2018-08-11: 10 mL/h via INTRAVENOUS

## 2018-08-11 MED ORDER — FERROUS SULFATE 325 (65 FE) MG PO TABS
325.0000 mg | ORAL_TABLET | ORAL | Status: DC
  Filled 2018-08-11: qty 1

## 2018-08-11 NOTE — Assessment & Plan Note (Signed)
Has been inconsistent with the furosemide --due to her frequency and incontinence Counseled---has to take daily till her weight is back to 230# (her dry weight)

## 2018-08-11 NOTE — Assessment & Plan Note (Signed)
Tearful and frustrated at times Not MDD now Off meds

## 2018-08-11 NOTE — H&P (Signed)
History and Physical    Chelsea Keller OVF:[id number] DOB: [birth year] DOA: 08/11/2018  PCP: Venia Carbon, MD Patient coming from: Home  I have personally briefly reviewed patient's old medical records in [place]  Chief Complaint: Shortness of breath  HPI: Chelsea Keller is a 84 y.o. female with medical history significant for morbid obesity, heart failure with preserved ejection fraction, DVT/PE on Eliquis, hypertension and history of GI bleed with known gastritis, diverticular disease, colonic polyps and colonic angiodysplasia presenting from her PCP office for symptomatic anemia.  Patient reports progressive shortness of breath.  She denies hematemesis, melena, hematochezia, hematuria.  No recent falls or trauma.  She does endorse easy bruising and bleeding on Eliquis.  She denies infectious symptoms, cough.  Her most recent GI bleed was in September 2019, at which time colonoscopy demonstrated diverticular disease.  She does note a 30 pound weight gain over the last 6 months that is associated with inconsistent use of Lasix.  She limits her Lasix intake due to the frequency in which she urinates after taking Lasix.   ED Course: In the ED, patient afebrile, heart rate in the 80s, blood pressure 125/60 and saturating comfortably on room air.  Labs notable for hemoglobin 5.0, down from 11.3 in October 2019.  Fecal occult positive.  Renal function and electrolytes are within normal limits.  BNP 112, down from 121 in May 2019.  Initial troponin 0 0.03.  EKG shows sinus rhythm with PACs and a rate of 85.  Chest x-ray shows cardiomegaly without acute cardiopulmonary disease.  2 units of packed red blood cells were started while patient was still in the emergency department.  Review of Systems: As per HPI otherwise 10 point review of systems negative.   Past Medical History:  Diagnosis Date  . Allergy   . Anemia   . Anxiety   . Arthritis   . Arthritis of sacroiliac joint of both sides  11/12/2017  . Bilateral pulmonary embolism ([place]) 06/23/2017  . Chronic diastolic CHF (congestive heart failure) (HCC)    a. Echo 1/16:  mild LVH, EF normal, grade 1 DD, MAC  . Chronic venous insufficiency    chronic LE edema  . DDD (degenerative disc disease), lumbar 11/12/2017  . Degenerative joint disease (DJD) of hip, Bilateral  11/12/2017  . Depression   . Fibromyalgia    constant pain  . Hx of cardiac catheterization    a. LHC in [place] "ok" per patient with mild plaque in a single vessel - records not available  . Hx of cardiovascular stress test    a. Nuclear study in 2008 normal  . Hx of colonic polyps   . Hypertension   . Hypertriglyceridemia   . Impaired fasting glucose   . PONV (postoperative nausea and vomiting)   . PUD (peptic ulcer disease)    hx of gastric ulcer  . Pulmonary emboli ([place]) 06/2017  . Vitamin B12 deficiency     Past Surgical History:  Procedure Laterality Date  . ABDOMINAL HYSTERECTOMY    . CATARACT EXTRACTION  10/2003   OD  . CHOLECYSTECTOMY    . COLONOSCOPY W/ POLYPECTOMY    . COLONOSCOPY WITH PROPOFOL N/A 10/15/2014   Procedure: COLONOSCOPY WITH PROPOFOL;  Surgeon: Ladene Artist, MD;  Location: [facility] ENDOSCOPY;  Service: Endoscopy;  Laterality: N/A;  . COLONOSCOPY WITH PROPOFOL N/A 03/23/2018   Procedure: COLONOSCOPY WITH PROPOFOL;  Surgeon: Gatha Mayer, MD;  Location: [facility] ENDOSCOPY;  Service: Endoscopy;  Laterality: N/A;  .  ESOPHAGOGASTRODUODENOSCOPY (EGD) WITH PROPOFOL N/A 10/15/2014   Procedure: ESOPHAGOGASTRODUODENOSCOPY (EGD) WITH PROPOFOL;  Surgeon: Ladene Artist, MD;  Location: [facility] ENDOSCOPY;  Service: Endoscopy;  Laterality: N/A;  . EXTERNAL FIXATION ANKLE FRACTURE     Fx.  left ankle-fixation with pins later removed sec to infection 1985  . EXTERNAL FIXATION WRIST FRACTURE  1985   left with pins  . EYE SURGERY    . FEMUR FRACTURE SURGERY  06/2009  . FRACTURE SURGERY    . HARDWARE REMOVAL Left 10/05/2013   Procedure: HARDWARE REMOVAL LEFT  DISTAL FEMUR;  Surgeon: Rozanna Box, MD;  Location: [place];  Service: Orthopedics;  Laterality: Left;  . HEMIARTHROPLASTY SHOULDER FRACTURE  06/2009  . HOT HEMOSTASIS N/A 03/23/2018   Procedure: HOT HEMOSTASIS (ARGON PLASMA COAGULATION/BICAP);  Surgeon: Gatha Mayer, MD;  Location: [facility] ENDOSCOPY;  Service: Endoscopy;  Laterality: N/A;  . JOINT REPLACEMENT    . STERIOD INJECTION Right 10/05/2013   Procedure: STEROID INJECTION;  Surgeon: Rozanna Box, MD;  Location: [place];  Service: Orthopedics;  Laterality: Right;  . TONSILLECTOMY    . TONSILLECTOMY    . TOTAL KNEE ARTHROPLASTY  03/09   left     reports that she quit smoking about 25 years ago. Her smoking use included cigarettes. She has a 40.00 pack-year smoking history. She has never used smokeless tobacco. She reports that she does not drink alcohol or use drugs.  Allergies  Allergen Reactions  . Codeine Sulfate Shortness Of Breath  . Celecoxib Other (See Comments)    Caused vaginal bleeding  . Erythromycin Base Nausea And Vomiting    Family History  Problem Relation Age of Onset  . Depression Mother   . Cancer Mother        uterine cancer  . Heart attack Father   . Cancer Brother        prostate cancer  . Diabetes Maternal Aunt   . Arthritis Brother   . Asthma Brother   . Stroke Maternal Grandmother   . Pulmonary embolism Daughter    Prior to Admission medications   Medication Sig Start Date End Date Taking? Authorizing Provider  acetaminophen (TYLENOL) 500 MG tablet Take 1,000-1,500 mg by mouth every 6 (six) hours as needed for moderate pain or headache (pain).    Yes [provider]  apixaban (ELIQUIS) 2.5 MG TABS tablet Take 2.5 mg by mouth 2 (two) times daily.   Yes [provider]  furosemide (LASIX) 80 MG tablet Take 80 mg by mouth daily.    Yes [provider]  gabapentin (NEURONTIN) 300 MG capsule Take 1 capsule (300 mg total) by mouth 3 (three) times daily. Patient taking  differently: Take 300-600 mg by mouth 2 (two) times daily. Take 1 capsule in the morning and Take 2 capsules at bedtime 07/25/18  Yes Viviana Simpler I, MD  potassium chloride SA (K-DUR,KLOR-CON) 20 MEQ tablet Take 20 mEq by mouth 2 (two) times daily.   Yes [provider]  traMADol (ULTRAM) 50 MG tablet Take 1 tablet (50 mg total) by mouth every 6 (six) hours as needed for moderate pain. Patient taking differently: Take 50 mg by mouth every 12 (twelve) hours as needed for moderate pain.  07/25/18  Yes Venia Carbon, MD  triamterene-hydrochlorothiazide (MAXZIDE-25) 37.5-25 MG tablet Take 1 tablet by mouth daily. 04/20/18  Yes Venia Carbon, MD    Physical Exam: Vitals:   08/11/18 2106 08/11/18 2120 08/11/18 2130 08/11/18 2219  BP: Marland Kitchen)  123/52 (!) 105/48 (!) 125/43 100/63  Pulse: 79 76 74 75  Resp: _0 Temp: 98.9 F (37.2 C) 98.8 F (37.1 C)  98 F (36.7 C)  TempSrc: Oral Oral  Oral  SpO2: 100% 100% 100% 100%  Weight:      Height:        Constitutional: NAD, calm, comfortable Eyes: PERRL, (+) conjunctival rim pallor ENMT: Mucous membranes are moist. Posterior pharynx clear of any exudate or lesions Neck: normal, supple, no masses Respiratory: clear to auscultation bilaterally, no wheezing, no crackles. Normal respiratory effort. Cardiovascular: Regular rate and rhythm, no murmurs / rubs / gallops. 2+ lower extremity edema. 2+ pedal pulses. Abdomen: no tenderness, no masses palpated. Bowel sounds positive.  Musculoskeletal: no clubbing / cyanosis. No joint deformity upper and lower extremities. Good ROM, no contractures. Normal muscle tone.  Skin: no rashes, lesions, ulcers. Scattered ecchymoses. Neurologic: CN 2-12 grossly intact. Sensation intact, DTR normal. Strength 5/5 in all 4.  Psychiatric: Normal judgment and insight. Alert and oriented x 3. Normal mood.    Labs on Admission: I have personally reviewed following labs and imaging studies  CBC: Recent  Labs  Lab 08/11/18 1419 08/11/18 1908 08/11/18 2041  WBC 9.9 10.8* 10.9*  HGB 5.1 Repeated and verified X2.* 5.0* 4.9*  HCT 17.4 Repeated and verified X2.* 19.6* 18.9*  MCV 66.3* 75.1* 74.7*  PLT 346.0 335 785   Basic Metabolic Panel: Recent Labs  Lab 08/11/18 1419 08/11/18 2100  NA 138  --   K 3.6  --   CL 99  --   CO2 27  --   GLUCOSE 92  --   BUN 31*  --   CREATININE 1.12  --   CALCIUM 8.5  --   MG  --  2.1  PHOS 4.0 3.7   GFR: Estimated Creatinine Clearance: 47.9 mL/min (by C-G formula based on SCr of 1.12 mg/dL). Liver Function Tests: Recent Labs  Lab 08/11/18 1419  ALBUMIN 3.5   No results for input(s): LIPASE, AMYLASE in the last 168 hours. No results for input(s): AMMONIA in the last 168 hours. Coagulation Profile: Recent Labs  Lab 08/11/18 2100  INR 1.66   Cardiac Enzymes: No results for input(s): CKTOTAL, CKMB, CKMBINDEX, TROPONINI in the last 168 hours. BNP (last 3 results) No results for input(s): PROBNP in the last 8760 hours. HbA1C: No results for input(s): HGBA1C in the last 72 hours. CBG: No results for input(s): GLUCAP in the last 168 hours. Lipid Profile: No results for input(s): CHOL, HDL, LDLCALC, TRIG, CHOLHDL, LDLDIRECT in the last 72 hours. Thyroid Function Tests: No results for input(s): TSH, T4TOTAL, FREET4, T3FREE, THYROIDAB in the last 72 hours. Anemia Panel: Recent Labs    08/11/18 2057 08/11/18 2058 08/11/18 2100  VITAMINB12  --  477  --   FERRITIN  --  2*  --   TIBC 456*  --   --   IRON 11*  --   --   RETICCTPCT  --   --  3.4*   Urine analysis:    Component Value Date/Time   COLORURINE YELLOW 06/23/2017 [place] 06/23/2017 0928   LABSPEC 1.021 06/23/2017 0928   PHURINE 5.0 06/23/2017 0928   GLUCOSEU NEGATIVE 06/23/2017 0928   HGBUR NEGATIVE 06/23/2017 0928   HGBUR negative 01/13/2010 [place] 06/23/2017 0928   BILIRUBINUR negative 02/11/2017 [place]  06/23/2017 [place] 06/23/2017 8850  UROBILINOGEN 0.2 02/11/2017 1709   UROBILINOGEN 0.2 01/13/2010 1056   NITRITE NEGATIVE 06/23/2017 0928   LEUKOCYTESUR NEGATIVE 06/23/2017 0928    Radiological Exams on Admission: Dg Chest Portable 1 View  Result Date: 08/11/2018 CLINICAL DATA:  Exertional shortness of breath, weakness, and dizziness. Former smoker. History of hypertension and congestive heart failure. EXAM: PORTABLE CHEST 1 VIEW COMPARISON:  03/21/2018 FINDINGS: Shallow inspiration. Cardiac enlargement. No vascular congestion, edema, or consolidation. No blunting of costophrenic angles. No pneumothorax. Mediastinal contours appear intact. Calcification of the aorta. Degenerative changes in the shoulder with diffuse heterogeneous lucent and sclerotic changes in the right humeral head. This could represent degenerative change, bone lesion, or old fracture deformity. No change since prior study. IMPRESSION: Cardiac enlargement. No evidence of active pulmonary disease. Electronically Signed   By: Lucienne Capers M.D.   On: 08/11/2018 19:43    EKG: Independently reviewed. Sinus rhythm with PACs, rate 89.  Assessment/Plan Active Problems:   Blood loss anemia  Blood loss anemia appears chronic given her overall stability, and appears to be in the setting of GI bleed, although source is unclear at this time.  Fecal occult positive.  Prior EGD and colonoscopy as demonstrated mild gastritis, diverticular disease, colonic polyps, angiodysplasia and internal hemorrhoids.  Bleed is in the setting of resumption of Eliquis following her prior GI bleed in 2019.  1. Blood loss anemia 2/2 suspected GI bleed - Blood products for goal Hgb >7 - Maintain active T&S; check coags - Start IV PPI BID - GI consult in AM for EGD/colonoscopy - Hold Eliquis, VTE ppx - Check post-transfusion H&H; Q8 CBC to ensure appropriate response and stability - Check iron profile, B12, folate, hemolysis  labs  2. HFpEF - Encourage compliance with Lasix - Would resume Lasix in AM once blood volume is repleted appropriately - Hold home triamterene, HCTZ initially  3. H/o PE on Eliquis - Hold Eliquis as above - Risk/benefit discussion of further A/C prudent given recurrent GI bleed  DVT prophylaxis: None Code Status: Full Disposition Plan: Home in 2-3 days Consults called: None Admission status: Inpatient   Bennie Pierini MD [facility]  If 7PM-7AM, please contact night-coverage [email or web address] Password [facility]  08/11/2018, 11:34 PM

## 2018-08-11 NOTE — Telephone Encounter (Signed)
[facility] called critical labs, HCT - 5.1, HCT - 17.4. Results given to Dr Silvio Pate

## 2018-08-11 NOTE — ED Provider Notes (Addendum)
[place] DEPT Provider Note   CSN: [id number] Arrival date & time: 08/11/18  1752     History   Chief Complaint Chief Complaint  Patient presents with  . Abnormal Lab    HPI Chelsea Keller is a 84 y.o. female.  HPI Patient presents being sent in by her PCP for hemoglobin of 5.  Has had shortness of breath for the last couple weeks.  Reportedly has a weight that is up around 30 pounds 2.  Edema bilateral lower extremities.  On Eliquis for pulmonary embolisms.  Has had previous GI bleeds.  States she is been more short of breath.  She sleeps in a chair at baseline but states this is from her hips not her shortness of breath.  No black stool.  No blood in stool Past Medical History:  Diagnosis Date  . Allergy   . Anemia   . Anxiety   . Arthritis   . Arthritis of sacroiliac joint of both sides 11/12/2017  . Bilateral pulmonary embolism ([place]) 06/23/2017  . Chronic diastolic CHF (congestive heart failure) (HCC)    a. Echo 1/16:  mild LVH, EF normal, grade 1 DD, MAC  . Chronic venous insufficiency    chronic LE edema  . DDD (degenerative disc disease), lumbar 11/12/2017  . Degenerative joint disease (DJD) of hip, Bilateral  11/12/2017  . Depression   . Fibromyalgia    constant pain  . Hx of cardiac catheterization    a. LHC in [place] "ok" per patient with mild plaque in a single vessel - records not available  . Hx of cardiovascular stress test    a. Nuclear study in 2008 normal  . Hx of colonic polyps   . Hypertension   . Hypertriglyceridemia   . Impaired fasting glucose   . PONV (postoperative nausea and vomiting)   . PUD (peptic ulcer disease)    hx of gastric ulcer  . Pulmonary emboli ([place]) 06/2017  . Vitamin B12 deficiency     Patient Active Problem List   Diagnosis Date Noted  . Acute on chronic diastolic heart failure ([place]) 08/11/2018  . Diarrhea 04/20/2018  . Acute gout 04/01/2018  . Angiodysplasia of colon   . Symptomatic anemia  03/21/2018  . Degenerative joint disease (DJD) of hip, Bilateral  11/12/2017  . DDD (degenerative disc disease), lumbar 11/12/2017  . Arthritis of sacroiliac joint of both sides 11/12/2017  . History of pulmonary embolism 11/07/2017  . Chronic anticoagulation 11/07/2017  . Diverticulitis of small intestine with perforation 11/07/2017  . Neurodermatitis 07/28/2017  . Urinary incontinence 05/26/2017  . Intractable pain-left hip 04/03/2017  . Fibromyalgia 04/03/2017  . Chronic diastolic CHF (congestive heart failure) ([place]) 04/03/2017  . Hypertension 04/03/2017  . Hypertriglyceridemia 04/03/2017  . Vitamin B12 deficiency 04/03/2017  . CKD (chronic kidney disease) stage 3, GFR 30-59 ml/min (HCC) 04/03/2017  . Left sided sciatica   . Advance directive discussed with patient 01/19/2017  . Mood disorder ([place]) 11/03/2013  . Knee joint pain 10/31/2013  . Psoriasis 02/21/2013  . Routine general medical examination at a health care facility 06/14/2012  . BMI 50.0-59.9, adult ([place]) 12/02/2011  . VENTRICULAR HYPERTROPHY, LEFT 10/19/2008  . Sleep disturbance 10/25/2007  . Venous (peripheral) insufficiency 08/11/2007  . Osteoarthritis, multiple sites 08/11/2007  . Impaired fasting glucose 08/11/2007  . Hx of adenomatous colonic polyps 05/20/2007  . B12 DEFICIENCY 09/30/2006  . Essential hypertension 09/30/2006  . Allergic rhinitis due to pollen 09/30/2006    Past Surgical  History:  Procedure Laterality Date  . ABDOMINAL HYSTERECTOMY    . CATARACT EXTRACTION  10/2003   OD  . CHOLECYSTECTOMY    . COLONOSCOPY W/ POLYPECTOMY    . COLONOSCOPY WITH PROPOFOL N/A 10/15/2014   Procedure: COLONOSCOPY WITH PROPOFOL;  Surgeon: Ladene Artist, MD;  Location[facility] ENDOSCOPY;  Service: Endoscopy;  Laterality: N/A;  . COLONOSCOPY WITH PROPOFOL N/A 03/23/2018   Procedure: COLONOSCOPY WITH PROPOFOL;  Surgeon: Gatha Mayer, MD;  Location: [facility] ENDOSCOPY;  Service: Endoscopy;  Laterality: N/A;  .  ESOPHAGOGASTRODUODENOSCOPY (EGD) WITH PROPOFOL N/A 10/15/2014   Procedure: ESOPHAGOGASTRODUODENOSCOPY (EGD) WITH PROPOFOL;  Surgeon: Ladene Artist, MD;  Location[facility] ENDOS[facility];  Service: Endoscopy;  Laterality: N/A;  . EXTERNAL FIXATION ANKLE FRACTURE     Fx.  left ankle-fixation with pins later removed sec to infection 1985  . EXTERNAL FIXATION WRIST FRACTURE  1985   left with pins  . EYE SURGERY    . FEMUR FRACTURE SURGERY  06/2009  . FRACTURE SURGERY    . HARDWARE REMOVAL Left 10/05/2013   Procedure: HARDWARE REMOVAL LEFT DISTAL FEMUR;  Surgeon: Rozanna Box, MD;  Location: [place];  Service: Orthopedics;  Laterality: Left;  . HEMIARTHROPLASTY SHOULDER FRACTURE  06/2009  . HOT HEMOSTASIS N/A 03/23/2018   Procedure: HOT HEMOSTASIS (ARGON PLASMA COAGULATION/BICAP);  Surgeon: Gatha Mayer, MD;  Location[facility] ENDOSCOPY;  Service: Endoscopy;  Laterality: N/A;  . JOINT REPLACEMENT    . STERIOD INJECTION Right 10/05/2013   Procedure: STEROID INJECTION;  Surgeon: Rozanna Box, MD;  Location: [place];  Service: Orthopedics;  Laterality: Right;  . TONSILLECTOMY    . TONSILLECTOMY    . TOTAL KNEE ARTHROPLASTY  03/09   left     OB History   No obstetric history on file.      Home Medications    Prior to Admission medications   Medication Sig Start Date End Date Taking? Authorizing Provider  acetaminophen (TYLENOL) 500 MG tablet Take 1,000-1,500 mg by mouth every 6 (six) hours as needed for moderate pain or headache (pain).    Yes [provider]  apixaban (ELIQUIS) 2.5 MG TABS tablet Take 2.5 mg by mouth 2 (two) times daily.   Yes [provider]  furosemide (LASIX) 80 MG tablet Take 80 mg by mouth daily.    Yes [provider]  gabapentin (NEURONTIN) 300 MG capsule Take 1 capsule (300 mg total) by mouth 3 (three) times daily. Patient taking differently: Take 300-600 mg by mouth 2 (two) times daily. Take 1 capsule in the morning and Take 2 capsules at bedtime  07/25/18  Yes Viviana Simpler I, MD  potassium chloride SA (K-DUR,KLOR-CON) 20 MEQ tablet Take 20 mEq by mouth 2 (two) times daily.   Yes [provider]  traMADol (ULTRAM) 50 MG tablet Take 1 tablet (50 mg total) by mouth every 6 (six) hours as needed for moderate pain. Patient taking differently: Take 50 mg by mouth every 12 (twelve) hours as needed for moderate pain.  07/25/18  Yes Venia Carbon, MD  triamterene-hydrochlorothiazide (MAXZIDE-25) 37.5-25 MG tablet Take 1 tablet by mouth daily. 04/20/18  Yes Venia Carbon, MD    Family History Family History  Problem Relation Age of Onset  . Depression Mother   . Cancer Mother        uterine cancer  . Heart attack Father   . Cancer Brother        prostate cancer  . Diabetes Maternal Aunt   .  Arthritis Brother   . Asthma Brother   . Stroke Maternal Grandmother   . Pulmonary embolism Daughter     Social History Social History   Tobacco Use  . Smoking status: Former Smoker    Packs/day: 1.00    Years: 40.00    Pack years: 40.00    Types: Cigarettes    Last attempt to quit: 07/06/1993    Years since quitting: 25.1  . Smokeless tobacco: Never Used  Substance Use Topics  . Alcohol use: No    Alcohol/week: 0.0 standard drinks  . Drug use: No     Allergies   Codeine sulfate; Celecoxib; and Erythromycin base   Review of Systems Review of Systems  Constitutional: Negative for appetite change and fever.  HENT: Negative for congestion.   Respiratory: Positive for shortness of breath.   Cardiovascular: Positive for leg swelling.  Gastrointestinal: Negative for abdominal pain.  Genitourinary: Negative for flank pain.  Musculoskeletal: Positive for back pain and gait problem.  Skin: Negative for rash.  Neurological: Positive for weakness.  Psychiatric/Behavioral: Negative for confusion.     Physical Exam Updated Vital Signs BP (!) 125/50 (BP Location: Right Arm)   Pulse 82   Temp 99.1 F (37.3 C) (Oral)    Resp 17   Ht _0  (1.575 m)   Wt 117.5 kg   SpO2 100%   BMI 47.37 kg/m   Physical Exam HENT:     Head: Normocephalic.     Mouth/Throat:     Mouth: Mucous membranes are moist.  Eyes:     Pupils: Pupils are equal, round, and reactive to light.  Neck:     Musculoskeletal: Neck supple.  Cardiovascular:     Rate and Rhythm: Regular rhythm.  Pulmonary:     Breath sounds: No rhonchi.     Comments: Harsh breath sounds, particularly at bases. Abdominal:     Tenderness: There is no abdominal tenderness.  Genitourinary:    Rectum: Guaiac result positive.  Musculoskeletal:     Right lower leg: Edema present.  Skin:    General: Skin is warm.     Capillary Refill: Capillary refill takes less than 2 seconds.  Neurological:     Mental Status: She is alert and oriented to person, place, and time.      ED Treatments / Results  Labs (all labs ordered are listed, but only abnormal results are displayed) Labs Reviewed  BRAIN NATRIURETIC PEPTIDE - Abnormal; Notable for the following components:      Result Value   B Natriuretic Peptide 112.4 (*)    All other components within normal limits  CBC - Abnormal; Notable for the following components:   WBC 10.8 (*)    RBC 2.61 (*)    Hemoglobin 5.0 (*)    HCT 19.6 (*)    MCV 75.1 (*)    MCH 19.2 (*)    MCHC 25.5 (*)    RDW 18.7 (*)    nRBC 0.5 (*)    All other components within normal limits  POC OCCULT BLOOD, ED - Abnormal; Notable for the following components:   Fecal Occult Bld POSITIVE (*)    All other components within normal limits  I-STAT TROPONIN, ED  TYPE AND SCREEN    EKG None  Radiology Dg Chest Portable 1 View  Result Date: 08/11/2018 CLINICAL DATA:  Exertional shortness of breath, weakness, and dizziness. Former smoker. History of hypertension and congestive heart failure. EXAM: PORTABLE CHEST 1 VIEW COMPARISON:  03/21/2018 FINDINGS:  Shallow inspiration. Cardiac enlargement. No vascular congestion, edema, or  consolidation. No blunting of costophrenic angles. No pneumothorax. Mediastinal contours appear intact. Calcification of the aorta. Degenerative changes in the shoulder with diffuse heterogeneous lucent and sclerotic changes in the right humeral head. This could represent degenerative change, bone lesion, or old fracture deformity. No change since prior study. IMPRESSION: Cardiac enlargement. No evidence of active pulmonary disease. Electronically Signed   By: Lucienne Capers M.D.   On: 08/11/2018 19:43    Procedures Procedures (including critical care time)  Medications Ordered in ED Medications - No data to display   Initial Impression / Assessment and Plan / ED Course  I have reviewed the triage vital signs and the nursing notes.  Pertinent labs & imaging results that were available during my care of the patient were reviewed by me and considered in my medical decision making (see chart for details).     Patient with anemia.  Hemoglobin 5.  Guaiac positive on Eliquis.  Also volume overloaded.  Likely will need some volume management along with transfusion.  Has seen the [facility] in the past.  Will admit to hospitalist  CRITICAL CARE Performed by: Davonna Belling Total critical care time: 30 minutes Critical care time was exclusive of separately billable procedures and treating other patients. Critical care was necessary to treat or prevent imminent or life-threatening deterioration. Critical care was time spent personally by me on the following activities: development of treatment plan with patient and/or surrogate as well as nursing, discussions with consultants, evaluation of patient's response to treatment, examination of patient, obtaining history from patient or surrogate, ordering and performing treatments and interventions, ordering and review of laboratory studies, ordering and review of radiographic studies, pulse oximetry and re-evaluation of patient's condition.   Final  Clinical Impressions(s) / ED Diagnoses   Final diagnoses:  Anemia, unspecified type  Gastrointestinal hemorrhage, unspecified gastrointestinal hemorrhage type  Hypervolemia, unspecified hypervolemia type    ED Discharge Orders    None       Davonna Belling, MD 08/11/18 2019    Davonna Belling, MD 08/29/18 [phone]

## 2018-08-11 NOTE — ED Notes (Signed)
ED TO INPATIENT HANDOFF REPORT  Name/Age/Gender Chelsea Keller 84 y.o. female  Code Status Code Status History    Date Active Date Inactive Code Status Order ID Comments User Context   03/21/2018 2258 03/24/2018 1739 Full Code [id number]  Elwyn Reach, MD ED   11/06/2017 2109 11/12/2017 1655 Full Code [id number]  Elwyn Reach, MD Inpatient   06/23/2017 0931 06/28/2017 1917 Full Code [id number]  Rondel Jumbo, PA-C ED   04/03/2017 0919 04/05/2017 1821 Full Code [id number]  Samella Parr, NP Inpatient   08/03/2014 1253 08/07/2014 1635 Full Code [id number]  Burnell Blanks, MD Inpatient   10/05/2013 1703 10/06/2013 1431 Full Code [id number]  Jari Pigg, PA-C Inpatient      Home/SNF/Other Home  Chief Complaint Abnormal Labs, Sent by PCP  Level of Care/Admitting Diagnosis ED Disposition    ED Disposition Condition [place]: [facility] [[id number]]  Level of Care: Med-Surg [16]  Diagnosis: Blood loss anemia [818299]  Admitting Physician: Bennie Pierini [[id number]]  Attending Physician: Jonnie Finner, [place] [[id number]]  Estimated length of stay: past midnight tomorrow  Certification:: I certify this patient will need inpatient services for at least 2 midnights  PT Class (Do Not Modify): Inpatient [101]  PT Acc Code (Do Not Modify): Private [1]       Medical History Past Medical History:  Diagnosis Date  . Allergy   . Anemia   . Anxiety   . Arthritis   . Arthritis of sacroiliac joint of both sides 11/12/2017  . Bilateral pulmonary embolism ([place]) 06/23/2017  . Chronic diastolic CHF (congestive heart failure) (HCC)    a. Echo 1/16:  mild LVH, EF normal, grade 1 DD, MAC  . Chronic venous insufficiency    chronic LE edema  . DDD (degenerative disc disease), lumbar 11/12/2017  . Degenerative joint disease (DJD) of hip, Bilateral  11/12/2017  . Depression   . Fibromyalgia    constant pain  . Hx of cardiac catheterization    a. LHC in  [place] "ok" per patient with mild plaque in a single vessel - records not available  . Hx of cardiovascular stress test    a. Nuclear study in 2008 normal  . Hx of colonic polyps   . Hypertension   . Hypertriglyceridemia   . Impaired fasting glucose   . PONV (postoperative nausea and vomiting)   . PUD (peptic ulcer disease)    hx of gastric ulcer  . Pulmonary emboli ([place]) 06/2017  . Vitamin B12 deficiency     Allergies Allergies  Allergen Reactions  . Codeine Sulfate Shortness Of Breath  . Celecoxib Other (See Comments)    Caused vaginal bleeding  . Erythromycin Base Nausea And Vomiting    IV Location/Drains/Wounds Patient Lines/Drains/Airways Status   Active Line/Drains/Airways    Name:   Placement date:   Placement time:   Site:   Days:   Peripheral IV 08/11/18 Left Antecubital   08/11/18    1912    Antecubital   less than 1   Peripheral IV 08/11/18 Left Forearm   08/11/18    2047    Forearm   less than 1          Labs/Imaging Results for orders placed or performed during the hospital encounter of 08/11/18 (from the past 48 hour(s))  Brain natriuretic peptide (order ONLY if patient c/o SOB)     Status: Abnormal   Collection Time: 08/11/18  7:08 PM  Result Value Ref Range   B Natriuretic Peptide 112.4 (H) 0.0 - 100.0 pg/mL    Comment: Performed at [facility], [place] [address] [place], [place] [address]  CBC     Status: Abnormal   Collection Time: 08/11/18  7:08 PM  Result Value Ref Range   WBC 10.8 (H) 4.0 - 10.5 K/uL   RBC 2.61 (L) 3.87 - 5.11 MIL/uL   Hemoglobin 5.0 (LL) 12.0 - 15.0 g/dL    Comment: REPEATED TO VERIFY Reticulocyte Hemoglobin testing may be clinically indicated, consider ordering this additional test [id number] THIS CRITICAL RESULT HAS VERIFIED AND BEEN CALLED TO LEONARD,S. RN BY KATHLEEN COHEN ON 02 06 2020 AT 1942, AND HAS BEEN READ BACK. CRITICAL RESULT VERIFIED    HCT 19.6 (L) 36.0 - 46.0 %   MCV 75.1 (L) 80.0 - 100.0 fL    MCH 19.2 (L) 26.0 - 34.0 pg   MCHC 25.5 (L) 30.0 - 36.0 g/dL   RDW 18.7 (H) 11.5 - 15.5 %   Platelets 335 150 - 400 K/uL   nRBC 0.5 (H) 0.0 - 0.2 %    Comment: Performed at [facility], [place] [address], [place], [place] [address]  Type and screen [place]     Status: None (Preliminary result)   Collection Time: 08/11/18  7:08 PM  Result Value Ref Range   ABO/RH(D) B POS    Antibody Screen NEG    Sample Expiration 08/14/2018    Unit Number [id number]    Blood Component Type RED CELLS,LR    Unit division 00    Status of Unit ISSUED    Transfusion Status OK TO TRANSFUSE    Crossmatch Result      Compatible Performed at [facility], [place] [address], [place], [place] [address]    Unit Number [id number]    Blood Component Type RED CELLS,LR    Unit division 00    Status of Unit ALLOCATED    Transfusion Status OK TO TRANSFUSE    Crossmatch Result Compatible   I-stat troponin, ED     Status: None   Collection Time: 08/11/18  7:14 PM  Result Value Ref Range   Troponin i, poc 0.03 0.00 - 0.08 ng/mL   Comment 3            Comment: Due to the release kinetics of cTnI, a negative result within the first hours of the onset of symptoms does not rule out myocardial infarction with certainty. If myocardial infarction is still suspected, repeat the test at appropriate intervals.   POC occult blood, ED     Status: Abnormal   Collection Time: 08/11/18  8:17 PM  Result Value Ref Range   Fecal Occult Bld POSITIVE (A) NEGATIVE  Prepare RBC     Status: None   Collection Time: 08/11/18  8:41 PM  Result Value Ref Range   Order Confirmation      ORDER PROCESSED BY BLOOD BANK Performed at [facility], [place] [address] [place], [place] [address]   CBC     Status: Abnormal   Collection Time: 08/11/18  8:41 PM  Result Value Ref Range   WBC 10.9 (H) 4.0 - 10.5 K/uL   RBC 2.53 (L) 3.87 - 5.11 MIL/uL   Hemoglobin  4.9 (LL) 12.0 - 15.0 g/dL    Comment: REPEATED TO VERIFY CRITICAL VALUE NOTED.  VALUE IS CONSISTENT WITH PREVIOUSLY REPORTED AND CALLED VALUE. Reticulocyte Hemoglobin testing may  be clinically indicated, consider ordering this additional test [id number]    HCT 18.9 (L) 36.0 - 46.0 %   MCV 74.7 (L) 80.0 - 100.0 fL   MCH 19.4 (L) 26.0 - 34.0 pg   MCHC 25.9 (L) 30.0 - 36.0 g/dL   RDW 19.0 (H) 11.5 - 15.5 %   Platelets 328 150 - 400 K/uL   nRBC 0.5 (H) 0.0 - 0.2 %    Comment: Performed at [facility], [place] [address], [place], [place] [address]   Dg Chest Portable 1 View  Result Date: 08/11/2018 CLINICAL DATA:  Exertional shortness of breath, weakness, and dizziness. Former smoker. History of hypertension and congestive heart failure. EXAM: PORTABLE CHEST 1 VIEW COMPARISON:  03/21/2018 FINDINGS: Shallow inspiration. Cardiac enlargement. No vascular congestion, edema, or consolidation. No blunting of costophrenic angles. No pneumothorax. Mediastinal contours appear intact. Calcification of the aorta. Degenerative changes in the shoulder with diffuse heterogeneous lucent and sclerotic changes in the right humeral head. This could represent degenerative change, bone lesion, or old fracture deformity. No change since prior study. IMPRESSION: Cardiac enlargement. No evidence of active pulmonary disease. Electronically Signed   By: Lucienne Capers M.D.   On: 08/11/2018 19:43   None  Pending Labs Unresulted Labs (From admission, onward)    Start     Ordered   08/11/18 2053  Ferritin  Add-on,   R     08/11/18 2052   08/11/18 2053  Vitamin B12  Add-on,   R     08/11/18 2052   08/11/18 2053  Folate RBC  Add-on,   R     08/11/18 2052   08/11/18 2053  Lactate dehydrogenase  Add-on,   R     08/11/18 2052   08/11/18 2053  Haptoglobin  Add-on,   R     08/11/18 2052   08/11/18 2052  Iron and TIBC  Add-on,   R     08/11/18 2052   Signed and Held  Phosphorus  Add-on,   R     Signed  and Held   Signed and Held  Magnesium  Add-on,   R     Signed and Held   Signed and Held  Reticulocytes  Add-on,   R     Signed and Held   Signed and Held  Protime-INR  Add-on,   R     Signed and Held   Signed and Held  APTT  Add-on,   R     Signed and Held   Signed and Held  Basic metabolic panel  Tomorrow morning,   R     Signed and Held   Signed and Held  CBC  Now then every 8 hours,   R     Signed and Held   Signed and Held  CBC  Once,   R     Signed and Held          Vitals/Pain Today's Vitals   08/11/18 2055 08/11/18 2100 08/11/18 2106 08/11/18 2120  BP:  (!) 112/46 (!) 123/52 (!) 105/48  Pulse:  73 79 76  Resp:  (!) _0 Temp: 98.9 F (37.2 C)  98.9 F (37.2 C) 98.8 F (37.1 C)  TempSrc: Oral  Oral Oral  SpO2:  100% 100% 100%  Weight:      Height:      PainSc:        Isolation Precautions No active isolations  Medications Medications  pantoprazole (PROTONIX) injection  40 mg (has no administration in time range)  0.9 %  sodium chloride infusion (10 mL/hr Intravenous New Bag/Given 08/11/18 2054)    Mobility walks with device

## 2018-08-11 NOTE — ED Triage Notes (Signed)
Pt BIB POV warm/dry/pale. Pt is c/o exertional SOB, weakness and dizziness. Pt got labs back from MD and HGB is 5.1 and pt has gained 30lbs since last visit. Pt has BLE with weeping that started a week ago.

## 2018-08-11 NOTE — ED Notes (Signed)
Date and time results received: 08/11/18 7:42 PM  (use smartphrase ".now" to insert current time)  Test: Hgb Critical Value: 5.0  Name of Provider Notified: Alvino Chapel  Orders Received? Or Actions Taken?: see orders

## 2018-08-11 NOTE — ED Notes (Signed)
ED TO INPATIENT HANDOFF REPORT  Name/Age/Gender Chelsea Keller 84 y.o. female  Code Status Code Status History    Date Active Date Inactive Code Status Order ID Comments User Context   03/21/2018 2258 03/24/2018 1739 Full Code [id number]  Elwyn Reach, MD ED   11/06/2017 2109 11/12/2017 1655 Full Code [id number]  Elwyn Reach, MD Inpatient   06/23/2017 0931 06/28/2017 1917 Full Code [id number]  Rondel Jumbo, PA-C ED   04/03/2017 0919 04/05/2017 1821 Full Code [id number]  Samella Parr, NP Inpatient   08/03/2014 1253 08/07/2014 1635 Full Code [id number]  Burnell Blanks, MD Inpatient   10/05/2013 1703 10/06/2013 1431 Full Code [id number]  Jari Pigg, PA-C Inpatient      Home/SNF/Other Home  Chief Complaint Abnormal Labs, Sent by PCP  Level of Care/Admitting Diagnosis ED Disposition    ED Disposition Condition [place]: [facility] [[id number]]  Level of Care: Med-Surg [16]  Diagnosis: Blood loss anemia [818299]  Admitting Physician: Bennie Pierini [[id number]]  Attending Physician: Jonnie Finner, [place] [[id number]]  Estimated length of stay: past midnight tomorrow  Certification:: I certify this patient will need inpatient services for at least 2 midnights  PT Class (Do Not Modify): Inpatient [101]  PT Acc Code (Do Not Modify): Private [1]       Medical History Past Medical History:  Diagnosis Date  . Allergy   . Anemia   . Anxiety   . Arthritis   . Arthritis of sacroiliac joint of both sides 11/12/2017  . Bilateral pulmonary embolism ([place]) 06/23/2017  . Chronic diastolic CHF (congestive heart failure) (HCC)    a. Echo 1/16:  mild LVH, EF normal, grade 1 DD, MAC  . Chronic venous insufficiency    chronic LE edema  . DDD (degenerative disc disease), lumbar 11/12/2017  . Degenerative joint disease (DJD) of hip, Bilateral  11/12/2017  . Depression   . Fibromyalgia    constant pain  . Hx of cardiac catheterization    a. LHC in  [place] "ok" per patient with mild plaque in a single vessel - records not available  . Hx of cardiovascular stress test    a. Nuclear study in 2008 normal  . Hx of colonic polyps   . Hypertension   . Hypertriglyceridemia   . Impaired fasting glucose   . PONV (postoperative nausea and vomiting)   . PUD (peptic ulcer disease)    hx of gastric ulcer  . Pulmonary emboli ([place]) 06/2017  . Vitamin B12 deficiency     Allergies Allergies  Allergen Reactions  . Codeine Sulfate Shortness Of Breath  . Celecoxib Other (See Comments)    Caused vaginal bleeding  . Erythromycin Base Nausea And Vomiting    IV Location/Drains/Wounds Patient Lines/Drains/Airways Status   Active Line/Drains/Airways    Name:   Placement date:   Placement time:   Site:   Days:   Peripheral IV 08/11/18 Left Antecubital   08/11/18    1912    Antecubital   less than 1   Peripheral IV 08/11/18 Left Forearm   08/11/18    2047    Forearm   less than 1          Labs/Imaging Results for orders placed or performed during the hospital encounter of 08/11/18 (from the past 48 hour(s))  Brain natriuretic peptide (order ONLY if patient c/o SOB)     Status: Abnormal   Collection Time: 08/11/18  7:08 PM  Result Value Ref Range   B Natriuretic Peptide 112.4 (H) 0.0 - 100.0 pg/mL    Comment: Performed at [facility], [place] [address] [place], [place] [address]  CBC     Status: Abnormal   Collection Time: 08/11/18  7:08 PM  Result Value Ref Range   WBC 10.8 (H) 4.0 - 10.5 K/uL   RBC 2.61 (L) 3.87 - 5.11 MIL/uL   Hemoglobin 5.0 (LL) 12.0 - 15.0 g/dL    Comment: REPEATED TO VERIFY Reticulocyte Hemoglobin testing may be clinically indicated, consider ordering this additional test [id number] THIS CRITICAL RESULT HAS VERIFIED AND BEEN CALLED TO LEONARD,S. RN BY KATHLEEN COHEN ON 02 06 2020 AT 1942, AND HAS BEEN READ BACK. CRITICAL RESULT VERIFIED    HCT 19.6 (L) 36.0 - 46.0 %   MCV 75.1 (L) 80.0 - 100.0 fL    MCH 19.2 (L) 26.0 - 34.0 pg   MCHC 25.5 (L) 30.0 - 36.0 g/dL   RDW 18.7 (H) 11.5 - 15.5 %   Platelets 335 150 - 400 K/uL   nRBC 0.5 (H) 0.0 - 0.2 %    Comment: Performed at [facility], [place] [address], [place], [place] [address]  Type and screen [place]     Status: None (Preliminary result)   Collection Time: 08/11/18  7:08 PM  Result Value Ref Range   ABO/RH(D) B POS    Antibody Screen NEG    Sample Expiration 08/14/2018    Unit Number [id number]    Blood Component Type RED CELLS,LR    Unit division 00    Status of Unit ISSUED    Transfusion Status OK TO TRANSFUSE    Crossmatch Result      Compatible Performed at [facility], [place] [address], [place], [place] [address]    Unit Number [id number]    Blood Component Type RED CELLS,LR    Unit division 00    Status of Unit ALLOCATED    Transfusion Status OK TO TRANSFUSE    Crossmatch Result Compatible   I-stat troponin, ED     Status: None   Collection Time: 08/11/18  7:14 PM  Result Value Ref Range   Troponin i, poc 0.03 0.00 - 0.08 ng/mL   Comment 3            Comment: Due to the release kinetics of cTnI, a negative result within the first hours of the onset of symptoms does not rule out myocardial infarction with certainty. If myocardial infarction is still suspected, repeat the test at appropriate intervals.   POC occult blood, ED     Status: Abnormal   Collection Time: 08/11/18  8:17 PM  Result Value Ref Range   Fecal Occult Bld POSITIVE (A) NEGATIVE  Prepare RBC     Status: None   Collection Time: 08/11/18  8:41 PM  Result Value Ref Range   Order Confirmation      ORDER PROCESSED BY BLOOD BANK Performed at [facility], [place] [address] [place], [place] [address]   CBC     Status: Abnormal   Collection Time: 08/11/18  8:41 PM  Result Value Ref Range   WBC 10.9 (H) 4.0 - 10.5 K/uL   RBC 2.53 (L) 3.87 - 5.11 MIL/uL   Hemoglobin  4.9 (LL) 12.0 - 15.0 g/dL    Comment: REPEATED TO VERIFY CRITICAL VALUE NOTED.  VALUE IS CONSISTENT WITH PREVIOUSLY REPORTED AND CALLED VALUE. Reticulocyte Hemoglobin testing may  be clinically indicated, consider ordering this additional test [id number]    HCT 18.9 (L) 36.0 - 46.0 %   MCV 74.7 (L) 80.0 - 100.0 fL   MCH 19.4 (L) 26.0 - 34.0 pg   MCHC 25.9 (L) 30.0 - 36.0 g/dL   RDW 19.0 (H) 11.5 - 15.5 %   Platelets 328 150 - 400 K/uL   nRBC 0.5 (H) 0.0 - 0.2 %    Comment: Performed at [facility], [place] [address], [place], [place] [address]   Dg Chest Portable 1 View  Result Date: 08/11/2018 CLINICAL DATA:  Exertional shortness of breath, weakness, and dizziness. Former smoker. History of hypertension and congestive heart failure. EXAM: PORTABLE CHEST 1 VIEW COMPARISON:  03/21/2018 FINDINGS: Shallow inspiration. Cardiac enlargement. No vascular congestion, edema, or consolidation. No blunting of costophrenic angles. No pneumothorax. Mediastinal contours appear intact. Calcification of the aorta. Degenerative changes in the shoulder with diffuse heterogeneous lucent and sclerotic changes in the right humeral head. This could represent degenerative change, bone lesion, or old fracture deformity. No change since prior study. IMPRESSION: Cardiac enlargement. No evidence of active pulmonary disease. Electronically Signed   By: Lucienne Capers M.D.   On: 08/11/2018 19:43   None  Pending Labs Unresulted Labs (From admission, onward)    Start     Ordered   08/11/18 2053  Ferritin  Add-on,   R     08/11/18 2052   08/11/18 2053  Vitamin B12  Add-on,   R     08/11/18 2052   08/11/18 2053  Folate RBC  Add-on,   R     08/11/18 2052   08/11/18 2053  Lactate dehydrogenase  Add-on,   R     08/11/18 2052   08/11/18 2053  Haptoglobin  Add-on,   R     08/11/18 2052   08/11/18 2052  Iron and TIBC  Add-on,   R     08/11/18 2052   Signed and Held  Phosphorus  Add-on,   R     Signed  and Held   Signed and Held  Magnesium  Add-on,   R     Signed and Held   Signed and Held  Reticulocytes  Add-on,   R     Signed and Held   Signed and Held  Protime-INR  Add-on,   R     Signed and Held   Signed and Held  APTT  Add-on,   R     Signed and Held   Signed and Held  Basic metabolic panel  Tomorrow morning,   R     Signed and Held   Signed and Held  CBC  Now then every 8 hours,   R     Signed and Held   Signed and Held  CBC  Once,   R     Signed and Held          Vitals/Pain Today's Vitals   08/11/18 2055 08/11/18 2100 08/11/18 2106 08/11/18 2120  BP:  (!) 112/46 (!) 123/52 (!) 105/48  Pulse:  73 79 76  Resp:  (!) _0 Temp: 98.9 F (37.2 C)  98.9 F (37.2 C) 98.8 F (37.1 C)  TempSrc: Oral  Oral Oral  SpO2:  100% 100% 100%  Weight:      Height:      PainSc:        Isolation Precautions No active isolations  Medications Medications  pantoprazole (PROTONIX) injection  40 mg (has no administration in time range)  0.9 %  sodium chloride infusion (10 mL/hr Intravenous New Bag/Given 08/11/18 2054)    Mobility walks

## 2018-08-11 NOTE — ED Notes (Signed)
Called to give report nurse will call back. 

## 2018-08-11 NOTE — Assessment & Plan Note (Signed)
Will recheck this and blood count to be sure nothing new

## 2018-08-11 NOTE — Assessment & Plan Note (Signed)
Weight is up from fluid now Not great with watching what she eats---discussed

## 2018-08-11 NOTE — Progress Notes (Signed)
Subjective:    Patient ID: Chelsea Keller, female    DOB: 1935-02-04, 84 y.o.   MRN: [id number]  HPI Here with grandson due to increased dizziness With grandson Marylyn Ishihara  Had not been taking the furosemide for a while "I can't explain what that does to me" Goes through a bundle of pads and constant voiding Will last for hours  Severe DOE--can't even walk a few yards Slight chest pain---but just fleeting Bad dizziness--- "I don't know where it is coming from" Gets it every time she stands up--goes along with the SOB No palpitations  Mood is still good No depression for now  Known CKD 3 Also has had anemia in the past---thinks this may be worse again  Current Outpatient Medications on File Prior to Visit  Medication Sig Dispense Refill  . acetaminophen (TYLENOL) 500 MG tablet Take 500 mg by mouth every 6 (six) hours as needed for headache (pain).    Marland Kitchen apixaban (ELIQUIS) 2.5 MG TABS tablet Take 2.5 mg by mouth 2 (two) times daily.    . furosemide (LASIX) 80 MG tablet Take 40 mg by mouth daily.    Marland Kitchen gabapentin (NEURONTIN) 300 MG capsule Take 1 capsule (300 mg total) by mouth 3 (three) times daily. 270 capsule 3  . potassium chloride SA (K-DUR,KLOR-CON) 20 MEQ tablet Take 20 mEq by mouth 2 (two) times daily.    . traMADol (ULTRAM) 50 MG tablet Take 1 tablet (50 mg total) by mouth every 6 (six) hours as needed for moderate pain. 60 tablet 0  . triamterene-hydrochlorothiazide (MAXZIDE-25) 37.5-25 MG tablet Take 1 tablet by mouth daily. 90 tablet 3   No current facility-administered medications on file prior to visit.     Allergies  Allergen Reactions  . Codeine Sulfate Shortness Of Breath  . Celecoxib Other (See Comments)    Caused vaginal bleeding  . Erythromycin Base Nausea And Vomiting    Past Medical History:  Diagnosis Date  . Allergy   . Anemia   . Anxiety   . Arthritis   . Arthritis of sacroiliac joint of both sides 11/12/2017  . Bilateral pulmonary embolism ([place])  06/23/2017  . Chronic diastolic CHF (congestive heart failure) (HCC)    a. Echo 1/16:  mild LVH, EF normal, grade 1 DD, MAC  . Chronic venous insufficiency    chronic LE edema  . DDD (degenerative disc disease), lumbar 11/12/2017  . Degenerative joint disease (DJD) of hip, Bilateral  11/12/2017  . Depression   . Fibromyalgia    constant pain  . Hx of cardiac catheterization    a. LHC in [place] "ok" per patient with mild plaque in a single vessel - records not available  . Hx of cardiovascular stress test    a. Nuclear study in 2008 normal  . Hx of colonic polyps   . Hypertension   . Hypertriglyceridemia   . Impaired fasting glucose   . PONV (postoperative nausea and vomiting)   . PUD (peptic ulcer disease)    hx of gastric ulcer  . Pulmonary emboli ([place]) 06/2017  . Vitamin B12 deficiency     Past Surgical History:  Procedure Laterality Date  . ABDOMINAL HYSTERECTOMY    . CATARACT EXTRACTION  10/2003   OD  . CHOLECYSTECTOMY    . COLONOSCOPY W/ POLYPECTOMY    . COLONOSCOPY WITH PROPOFOL N/A 10/15/2014   Procedure: COLONOSCOPY WITH PROPOFOL;  Surgeon: Ladene Artist, MD;  Location: [facility] END[facility]COPY;  Service: Endoscopy;  Laterality: N/A;  .  COLONOSCOPY WITH PROPOFOL N/A 03/23/2018   Procedure: COLONOSCOPY WITH PROPOFOL;  Surgeon: Gatha Mayer, MD;  Location: [facility];  Service: Endoscopy;  Laterality: N/A;  . ESOPHAGOGASTRODUODENOSCOPY (EGD) WITH PROPOFOL N/A 10/15/2014   Procedure: ESOPHAGOGASTRODUODENOSCOPY (EGD) WITH PROPOFOL;  Surgeon: Ladene Artist, MD;  Location: [facility] ENDOSCOPY;  Service: Endoscopy;  Laterality: N/A;  . EXTERNAL FIXATION ANKLE FRACTURE     Fx.  left ankle-fixation with pins later removed sec to infection 1985  . EXTERNAL FIXATION WRIST FRACTURE  1985   left with pins  . EYE SURGERY    . FEMUR FRACTURE SURGERY  06/2009  . FRACTURE SURGERY    . HARDWARE REMOVAL Left 10/05/2013   Procedure: HARDWARE REMOVAL LEFT DISTAL FEMUR;  Surgeon: Rozanna Box, MD;   Location: [facility];  Service: Orthopedics;  Laterality: Left;  . HEMIARTHROPLASTY SHOULDER FRACTURE  06/2009  . HOT HEMOSTASIS N/A 03/23/2018   Procedure: HOT HEMOSTASIS (ARGON PLASMA COAGULATION/BICAP);  Surgeon: Gatha Mayer, MD;  Location: [facility] ENDOSCOPY;  Service: Endoscopy;  Laterality: N/A;  . JOINT REPLACEMENT    . STERIOD INJECTION Right 10/05/2013   Procedure: STEROID INJECTION;  Surgeon: Rozanna Box, MD;  Location: [facility];  Service: Orthopedics;  Laterality: Right;  . TONSILLECTOMY    . TONSILLECTOMY    . TOTAL KNEE ARTHROPLASTY  03/09   left    Family History  Problem Relation Age of Onset  . Depression Mother   . Cancer Mother        uterine cancer  . Heart attack Father   . Cancer Brother        prostate cancer  . Diabetes Maternal Aunt   . Arthritis Brother   . Asthma Brother   . Stroke Maternal Grandmother   . Pulmonary embolism Daughter     Social History   Socioeconomic History  . Marital status: Widowed    Spouse name: Not on file  . Number of children: 4  . Years of education: Not on file  . Highest education level: Not on file  Occupational History  . Occupation: retired crossing guard  . 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 Financial resource strain: Not on file  . Food insecurity:    Worry: Not on file    Inability: Not on file  . Transportation needs:    Medical: Not on file    Non-medical: Not on file  Tobacco Use  . Smoking status: Former Smoker    Packs/day: 1.00    Years: 40.00    Pack years: 40.00    Types: Cigarettes    Last attempt to quit: 07/06/1993    Years since quitting: 25.1  . Smokeless tobacco: Never Used  Substance and Sexual Activity  . Alcohol use: No    Alcohol/week: 0.0 standard drinks  . Drug use: No  . Sexual activity: Not on file  Lifestyle  . Physical activity:    Days per week: Not on file    Minutes per session: Not on file  . Stress: Not on file  Relationships  . Social  connections:    Talks on phone: Not on file    Gets together: Not on file    Attends religious service: Not on file    Active member of club or organization: Not on file    Attends meetings of clubs or organizations: Not on file    Relationship status: Not on file  . Intimate partner violence:  Fear of current or ex partner: Not on file    Emotionally abused: Not on file    Physically abused: Not on file    Forced sexual activity: Not on file  Other Topics Concern  . 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  Cardiovascular: Normal rate, regular rhythm and normal heart sounds. Exam reveals no gallop.  No murmur heard. Respiratory: Effort normal and breath sounds normal. No respiratory distress. She has no wheezes. She has no rales.  ?slight dullness  Musculoskeletal:     Comments: 2+ reedy edema in calves---worse in thighs  Psychiatric: She has a normal mood and affect. Her behavior is normal.           Assessment & Plan:

## 2018-08-11 NOTE — Telephone Encounter (Signed)
Called patient  She had felt she was anemic and was right Asked her to have her daughter drive her to ER for evaluation. Will need transfusion and also therapy for fluid overload. She agrees

## 2018-08-12 DIAGNOSIS — K5521 Angiodysplasia of colon with hemorrhage: Secondary | ICD-10-CM

## 2018-08-12 DIAGNOSIS — K922 Gastrointestinal hemorrhage, unspecified: Secondary | ICD-10-CM

## 2018-08-12 DIAGNOSIS — D649 Anemia, unspecified: Secondary | ICD-10-CM

## 2018-08-12 DIAGNOSIS — I5033 Acute on chronic diastolic (congestive) heart failure: Secondary | ICD-10-CM

## 2018-08-12 LAB — BASIC METABOLIC PANEL
Anion gap: 8 (ref 5–15)
BUN: 29 mg/dL — ABNORMAL HIGH (ref 8–23)
CO2: 27 mmol/L (ref 22–32)
Calcium: 8.4 mg/dL — ABNORMAL LOW (ref 8.9–10.3)
Chloride: 102 mmol/L (ref 98–111)
Creatinine, Ser: 1.02 mg/dL — ABNORMAL HIGH (ref 0.44–1.00)
GFR calc Af Amer: 60 mL/min — ABNORMAL LOW (ref >60)
GFR calc non Af Amer: 52 mL/min — ABNORMAL LOW (ref >60)
GLUCOSE: 96 mg/dL (ref 70–99)
Potassium: 3.7 mmol/L (ref 3.5–5.1)
Sodium: 137 mmol/L (ref 135–145)

## 2018-08-12 LAB — CBC
HCT: 23.2 % — ABNORMAL LOW (ref 36.0–46.0)
Hemoglobin: 6.6 g/dL — CL (ref 12.0–15.0)
MCH: 21.7 pg — ABNORMAL LOW (ref 26.0–34.0)
MCHC: 28.4 g/dL — ABNORMAL LOW (ref 30.0–36.0)
MCV: 76.3 fL — ABNORMAL LOW (ref 80.0–100.0)
PLATELETS: 287 10*3/uL (ref 150–400)
RBC: 3.04 MIL/uL — ABNORMAL LOW (ref 3.87–5.11)
RDW: 18.5 % — ABNORMAL HIGH (ref 11.5–15.5)
WBC: 9.3 10*3/uL (ref 4.0–10.5)
nRBC: 0.6 % — ABNORMAL HIGH (ref 0.0–0.2)

## 2018-08-12 LAB — GLUCOSE, CAPILLARY: Glucose-Capillary: 90 mg/dL (ref 70–99)

## 2018-08-12 LAB — HEMOGLOBIN AND HEMATOCRIT, BLOOD
HCT: 27.2 % — ABNORMAL LOW (ref 36.0–46.0)
Hemoglobin: 7.6 g/dL — ABNORMAL LOW (ref 12.0–15.0)

## 2018-08-12 LAB — PREPARE RBC (CROSSMATCH)

## 2018-08-12 MED ORDER — FUROSEMIDE 10 MG/ML IJ SOLN
20.0000 mg | Freq: Once | INTRAMUSCULAR | Status: AC
  Administered 2018-08-12: 20 mg via INTRAVENOUS
  Filled 2018-08-12: qty 2

## 2018-08-12 MED ORDER — SODIUM CHLORIDE 0.9% IV SOLUTION
Freq: Once | INTRAVENOUS | Status: AC
  Administered 2018-08-12: 13:00:00 via INTRAVENOUS

## 2018-08-12 MED ORDER — PANTOPRAZOLE SODIUM 40 MG PO TBEC
40.0000 mg | DELAYED_RELEASE_TABLET | Freq: Every day | ORAL | Status: DC
  Administered 2018-08-13 – 2018-08-16 (×4): 40 mg via ORAL
  Filled 2018-08-12 (×4): qty 1

## 2018-08-12 MED ORDER — SODIUM CHLORIDE 0.9% IV SOLUTION: Freq: Once | INTRAVENOUS | Status: DC

## 2018-08-12 MED ORDER — SODIUM CHLORIDE 0.9 % IV SOLN
510.0000 mg | Freq: Once | INTRAVENOUS | Status: AC
  Administered 2018-08-13: 510 mg via INTRAVENOUS
  Filled 2018-08-12: qty 17

## 2018-08-12 NOTE — Plan of Care (Signed)
Patient resting in bed this morning. Received 2 units PRBC last night. No complaints of pain currently. Will continue to monitor.

## 2018-08-12 NOTE — Progress Notes (Signed)
[facility] PROGRESS NOTE  Chelsea Keller HQI:[id number] DOB: [birth year] DOA: 08/11/2018  PCP: Chelsea Carbon, MD  Brief History/Interval Summary: 84 y.o. female with medical history significant for morbid obesity, heart failure with preserved ejection fraction, DVT/PE on Eliquis, hypertension and history of GI bleed with known gastritis, diverticular disease, colonic polyps and colonic angiodysplasia presented from her PCP office for symptomatic anemia.  Her most recent GI bleed was in September 2019, at which time colonoscopy demonstrated diverticular disease internal hemorrhoids and angiodysplastic lesions.  She does note a 30 pound weight gain over the last 6 months that is associated with inconsistent use of Lasix.  Hemoglobin was noted to be 5.  Patient was hospitalized for further management.  Reason for Visit: Symptomatic anemia  Consultants: Gastroenterology  Procedures: None yet  Antibiotics: None  Subjective/Interval History: Patient states that she is feeling slightly better after the blood transfusion.  Denies any shortness of breath.  No chest pain.  Has not noted any bright red blood per rectum or black-colored stools.  ROS: Denies any nausea or vomiting  Objective:  Vital Signs  Vitals:   08/12/18 0357 08/12/18 0451 08/12/18 0500 08/12/18 0828  BP: (!) 110/45 (!) 120/44  (!) 126/52  Pulse: 71 66  70  Resp: 16 16  16   Temp: 98.4 F (36.9 C) 98.3 F (36.8 C)  98.1 F (36.7 C)  TempSrc: Oral Oral  Oral  SpO2: 95% 100%  98%  Weight:   117 kg   Height:        Intake/Output Summary (Last 24 hours) at 08/12/2018 1143 Last data filed at 08/12/2018 1018 Gross per 24 hour  Intake 863 ml  Output 1400 ml  Net -537 ml   Filed Weights   08/11/18 1852 08/12/18 0500  Weight: 117.5 kg 117 kg    General appearance: alert, cooperative, appears stated age and no distress Head: Normocephalic, without obvious abnormality, atraumatic Resp: Normal effort.   Diminished air entry at the bases.  No wheezing rales or rhonchi. Cardio: regular rate and rhythm, S1, S2 normal, no murmur, click, rub or gallop GI: soft, non-tender; bowel sounds normal; no masses,  no organomegaly Extremities: extremities normal, atraumatic, no cyanosis or edema Pulses: 2+ and symmetric Neurologic: No focal neurological deficits.  Lab Results:  Data Reviewed: I have personally reviewed following labs and imaging studies  CBC: Recent Labs  Lab 08/11/18 1419 08/11/18 1908 08/11/18 2041 08/12/18 0653  WBC 9.9 10.8* 10.9* 9.3  HGB 5.1 Repeated and verified X2.* 5.0* 4.9* 6.6*  HCT 17.4 Repeated and verified X2.* 19.6* 18.9* 23.2*  MCV 66.3* 75.1* 74.7* 76.3*  PLT 346.0 335 328 132    Basic Metabolic Panel: Recent Labs  Lab 08/11/18 1419 08/11/18 2100 08/12/18 0653  NA 138  --  137  K 3.6  --  3.7  CL 99  --  102  CO2 27  --  27  GLUCOSE 92  --  96  BUN 31*  --  29*  CREATININE 1.12  --  1.02*  CALCIUM 8.5  --  8.4*  MG  --  2.1  --   PHOS 4.0 3.7  --     GFR: Estimated Creatinine Clearance: 52.5 mL/min (A) (by C-G formula based on SCr of 1.02 mg/dL (H)).  Liver Function Tests: Recent Labs  Lab 08/11/18 1419  ALBUMIN 3.5    Coagulation Profile: Recent Labs  Lab 08/11/18 2100  INR 1.66    CBG: Recent Labs  Lab 08/12/18 0756  GLUCAP 90    Anemia Panel: Recent Labs    08/11/18 2057 08/11/18 2058 08/11/18 2100  VITAMINB12  --  477  --   FERRITIN  --  2*  --   TIBC 456*  --   --   IRON 11*  --   --   RETICCTPCT  --   --  3.4*     Radiology Studies: Dg Chest Portable 1 View  Result Date: 08/11/2018 CLINICAL DATA:  Exertional shortness of breath, weakness, and dizziness. Former smoker. History of hypertension and congestive heart failure. EXAM: PORTABLE CHEST 1 VIEW COMPARISON:  03/21/2018 FINDINGS: Shallow inspiration. Cardiac enlargement. No vascular congestion, edema, or consolidation. No blunting of costophrenic angles. No  pneumothorax. Mediastinal contours appear intact. Calcification of the aorta. Degenerative changes in the shoulder with diffuse heterogeneous lucent and sclerotic changes in the right humeral head. This could represent degenerative change, bone lesion, or old fracture deformity. No change since prior study. IMPRESSION: Cardiac enlargement. No evidence of active pulmonary disease. Electronically Signed   By: Lucienne Capers M.D.   On: 08/11/2018 19:43     Medications:  Scheduled: . sodium chloride   Intravenous Once  . ferrous sulfate  325 mg Oral Q M,W,F  . furosemide  20 mg Intravenous Once  . furosemide  20 mg Intravenous Once  . gabapentin  300 mg Oral Daily  . gabapentin  600 mg Oral QHS  . pantoprazole (PROTONIX) IV  40 mg Intravenous Q12H   Continuous:  LPF:[id number], ondansetron **OR** ondansetron (ZOFRAN) IV, traMADol    Assessment/Plan:  Symptomatic anemia/likely secondary to chronic GI blood loss/iron deficiency Patient has not noticed any overt GI bleed.  No melanotic stools or bright red blood per rectum.  Hemoglobin was 4.9 at presentation.  Patient is transfused 2 units of blood with improvement in hemoglobin to 6.6.  We will transfuse her one 1 unit.  We will give her diuretics.  Ferritin was noted to be 2.  Ferritin was 6 in May 2019.  Previously has not tolerated daily iron supplementation.  Will need to consider IV iron.  Chronic GI bleed Patient with known history of diverticular disease, internal hemorrhoids and angiodysplastic lesions on colonoscopy done last in September 2019.  This is in the setting of anticoagulation with Eliquis for history of PE.  Gastroenterology consulted.  Defer need for endoscopy to them.  Holding Eliquis for now.  History of pulmonary embolism This was in December 2018.  This was an unprovoked episode.  Eliquis currently on hold.  Acute on chronic diastolic CHF EF is normal based on echo done in December 2018.  Grade 1 diastolic  dysfunction was noted.  Patient apparently has gained some weight over the last few months due to noncompliance with Lasix.  No overt pulmonary edema noted on chest x-ray.  She does have some lower extremity edema.  We will give her intravenous diuretics.  Current weight is 117 kg.  She weighed 113 kg in January.  Weight was similar back in September 2019.  So not a significant increase in weight based on these numbers.  DVT Prophylaxis: SCDs    Code Status: Full code Family Communication: Discussed with the patient Disposition Plan: Await gastroenterology input.  Otherwise management as outlined above.  Patient lives with her daughter.  Anticipate discharge back to daughter's care when medically stable.    LOS: 1 day   Eldrige Pitkin [employer] on [email or web address]  08/12/2018, 11:43 AM

## 2018-08-12 NOTE — Plan of Care (Signed)
Patient lying in bed this morning. No complaints of pain currently. Waiting for physician to round to see what the plan is. Will continue to monitor.

## 2018-08-12 NOTE — Consult Note (Addendum)
Consultation  Referring Provider: [facility]/ [place] Primary Care Physician:  Venia Carbon, MD Primary Gastroenterologist:  Dr.Stark  Reason for Consultation:  Profound anemia, SOB  HPI: Chelsea Keller is a 84 y.o. female, established with Dr. Fuller Plan and seen initially in 2016 when she was hospitalized with iron deficiency anemia.  Patient was admitted through the emergency room last evening after she was sent by her PCP with labs showing hemoglobin of 5.  Patient had been seen by Dr. Silvio Pate with complaints of weakness and some shortness of breath over the past couple of weeks.  Labs were done showing hemoglobin 5.1 MCV 66.3 Last hemoglobin prior to that had been checked in October 2019 with hemoglobin 11.3/hematocrit 35.1 MCV 84.4.  Patient has been on chronic anticoagulation with Eliquis after bilateral unprovoked pulmonary emboli in December 2018.  She did not have DVTs at that time. She has prior history of iron deficiency anemia initially evaluated by GI in April 2016 when she was admitted with profound iron deficiency anemia.  EGD at that time per Dr. Fuller Plan showed mild gastritis, colonoscopy with finding of 7 polyps all of which were removed and 3 AVMs were found in the first and a sending colon and treated with APC.  Path on the polyps showed tubovillous adenomas, sessile serrated and hyperplastic polyps.  Duodenal biopsies were done and negative.  She was hospitalized again in September 2019 with hemoglobin of 4.7.  At that time again no overt bleeding.  She had repeat colonoscopy done per Dr. Carlean Purl with finding of multiple small to medium patchy angioectasia in the a sending colon were treated with APC.  Also noted to have multiple left colon diverticuli, no recurrent polyps.  Patient was transfused and received 1 dose of IV iron inpatient.  I do not believe she got a second dose outpatient.  She has not been on oral iron replacement as she had significant problems with  diarrhea after that hospitalization in September.  She had been on the oral iron but was also on colchicine and had both medications discontinued.  She does not want to go back on oral iron. She has no complaints of abdominal discomfort, no heartburn indigestion, bowel movements currently normal had not noticed any melena or hematochezia.  Patient was transfused 2 units of packed RBCs last night, hemoglobin 6.6 this morning. Current iron 11, TIBC 456 iron sat of 2 and ferritin of 2.   Past Medical History:  Diagnosis Date  . Allergy   . Anemia   . Anxiety   . Arthritis   . Arthritis of sacroiliac joint of both sides 11/12/2017  . Bilateral pulmonary embolism ([place]) 06/23/2017  . Chronic diastolic CHF (congestive heart failure) (HCC)    a. Echo 1/16:  mild LVH, EF normal, grade 1 DD, MAC  . Chronic venous insufficiency    chronic LE edema  . DDD (degenerative disc disease), lumbar 11/12/2017  . Degenerative joint disease (DJD) of hip, Bilateral  11/12/2017  . Depression   . Fibromyalgia    constant pain  . Hx of cardiac catheterization    a. LHC in [place] "ok" per patient with mild plaque in a single vessel - records not available  . Hx of cardiovascular stress test    a. Nuclear study in 2008 normal  . Hx of colonic polyps   . Hypertension   . Hypertriglyceridemia   . Impaired fasting glucose   . PONV (postoperative nausea and vomiting)   . PUD (peptic  ulcer disease)    hx of gastric ulcer  . Pulmonary emboli ([place]) 06/2017  . Vitamin B12 deficiency     Past Surgical History:  Procedure Laterality Date  . ABDOMINAL HYSTERECTOMY    . CATARACT EXTRACTION  10/2003   OD  . CHOLECYSTECTOMY    . COLONOSCOPY W/ POLYPECTOMY    . COLONOSCOPY WITH PROPOFOL N/A 10/15/2014   Procedure: COLONOSCOPY WITH PROPOFOL;  Surgeon: Ladene Artist, MD;  Location: [facility] ENDOSCOPY;  Service: Endoscopy;  Laterality: N/A;  . COLONOSCOPY WITH PROPOFOL N/A 03/23/2018   Procedure: COLONOSCOPY WITH PROPOFOL;   Surgeon: Gatha Mayer, MD;  Location: [facility] ENDOSCOPY;  Service: Endoscopy;  Laterality: N/A;  . ESOPHAGOGASTRODUODENOSCOPY (EGD) WITH PROPOFOL N/A 10/15/2014   Procedure: ESOPHAGOGASTRODUODENOSCOPY (EGD) WITH PROPOFOL;  Surgeon: Ladene Artist, MD;  Location: [facility] ENDOSCOPY;  Service: Endoscopy;  Laterality: N/A;  . EXTERNAL FIXATION ANKLE FRACTURE     Fx.  left ankle-fixation with pins later removed sec to infection 1985  . EXTERNAL FIXATION WRIST FRACTURE  1985   left with pins  . EYE SURGERY    . FEMUR FRACTURE SURGERY  06/2009  . FRACTURE SURGERY    . HARDWARE REMOVAL Left 10/05/2013   Procedure: HARDWARE REMOVAL LEFT DISTAL FEMUR;  Surgeon: Rozanna Box, MD;  Location: [place];  Service: Orthopedics;  Laterality: Left;  . HEMIARTHROPLASTY SHOULDER FRACTURE  06/2009  . HOT HEMOSTASIS N/A 03/23/2018   Procedure: HOT HEMOSTASIS (ARGON PLASMA COAGULATION/BICAP);  Surgeon: Gatha Mayer, MD;  Location[facility] ENDOSCOPY;  Service: Endoscopy;  Laterality: N/A;  . JOINT REPLACEMENT    . STERIOD INJECTION Right 10/05/2013   Procedure: STEROID INJECTION;  Surgeon: Rozanna Box, MD;  Location: [place];  Service: Orthopedics;  Laterality: Right;  . TONSILLECTOMY    . TONSILLECTOMY    . TOTAL KNEE ARTHROPLASTY  03/09   left    Prior to Admission medications   Medication Sig Start Date End Date Taking? Authorizing Provider  acetaminophen (TYLENOL) 500 MG tablet Take 1,000-1,500 mg by mouth every 6 (six) hours as needed for moderate pain or headache (pain).    Yes [provider]  apixaban (ELIQUIS) 2.5 MG TABS tablet Take 2.5 mg by mouth 2 (two) times daily.   Yes [provider]  furosemide (LASIX) 80 MG tablet Take 80 mg by mouth daily.    Yes [provider]  gabapentin (NEURONTIN) 300 MG capsule Take 1 capsule (300 mg total) by mouth 3 (three) times daily. Patient taking differently: Take 300-600 mg by mouth 2 (two) times daily. Take 1 capsule in the morning and Take 2  capsules at bedtime 07/25/18  Yes Viviana Simpler I, MD  potassium chloride SA (K-DUR,KLOR-CON) 20 MEQ tablet Take 20 mEq by mouth 2 (two) times daily.   Yes [provider]  traMADol (ULTRAM) 50 MG tablet Take 1 tablet (50 mg total) by mouth every 6 (six) hours as needed for moderate pain. Patient taking differently: Take 50 mg by mouth every 12 (twelve) hours as needed for moderate pain.  07/25/18  Yes Venia Carbon, MD  triamterene-hydrochlorothiazide (MAXZIDE-25) 37.5-25 MG tablet Take 1 tablet by mouth daily. 04/20/18  Yes Venia Carbon, MD    Current Facility-Administered Medications  Medication Dose Route Frequency Provider Last Rate Last Dose  . acetaminophen (TYLENOL) tablet 650 mg  650 mg Oral Q6H PRN Bennie Pierini, MD   650 mg at 08/12/18 0034  . ferrous sulfate tablet 325 mg  325  mg Oral Q M,W,F Bennie Pierini, MD      . gabapentin (NEURONTIN) capsule 300 mg  300 mg Oral Daily Bennie Pierini, MD   300 mg at 08/12/18 1000  . gabapentin (NEURONTIN) capsule 600 mg  600 mg Oral QHS Bennie Pierini, MD   600 mg at 08/12/18 0101  . ondansetron (ZOFRAN) tablet 4 mg  4 mg Oral Q6H PRN Bennie Pierini, MD       Or  . ondansetron [facility]) injection 4 mg  4 mg Intravenous Q6H PRN Bennie Pierini, MD      . pantoprazole (PROTONIX) injection 40 mg  40 mg Intravenous Q12H Bennie Pierini, MD   40 mg at 08/12/18 1005  . traMADol (ULTRAM) tablet 50 mg  50 mg Oral Q12H PRN Bennie Pierini, MD   50 mg at 08/12/18 0034    Allergies as of 08/11/2018 - Review Complete 08/11/2018  Allergen Reaction Noted  . Codeine sulfate Shortness Of Breath   . Celecoxib Other (See Comments)   . Erythromycin base Nausea And Vomiting     Family History  Problem Relation Age of Onset  . Depression Mother   . Cancer Mother        uterine cancer  . Heart attack Father   . Cancer Brother        prostate cancer  . Diabetes Maternal Aunt   . Arthritis Brother   .  Asthma Brother   . Stroke Maternal Grandmother   . Pulmonary embolism Daughter     Social History   Socioeconomic History  . Marital status: Widowed    Spouse name: Not on file  . Number of children: 4  . Years of education: Not on file  . Highest education level: Not on file  Occupational History  . Occupation: retired crossing guard  . 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 Financial resource strain: Not on file  . Food insecurity:    Worry: Not on file    Inability: Not on file  . Transportation needs:    Medical: Not on file    Non-medical: Not on file  Tobacco Use  . Smoking status: Former Smoker    Packs/day: 1.00    Years: 40.00    Pack years: 40.00    Types: Cigarettes    Last attempt to quit: 07/06/1993    Years since quitting: 25.1  . Smokeless tobacco: Never Used  Substance and Sexual Activity  . Alcohol use: No    Alcohol/week: 0.0 standard drinks  . Drug use: No  . Sexual activity: Not on file  Lifestyle  . Physical activity:    Days per week: Not on file    Minutes per session: Not on file  . Stress: Not on file  Relationships  . Social connections:    Talks on phone: Not on file    Gets together: Not on file    Attends religious service: Not on file    Active member of club or organization: Not on file    Attends meetings of clubs or organizations: Not on file    Relationship status: Not on file  . Intimate partner violence:    Fear of current or ex partner: Not on file    Emotionally abused: Not on file    Physically abused: Not on file    Forced sexual activity: Not on file  Other Topics Concern  . 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  All other review of systems was otherwise negative.  Physical Exam: Vital signs in last 24 hours: Temp:  [97.8 F (36.6 C)-99.1 F (37.3 C)] 98.1 F (36.7 C) (02/07 0828) Pulse Rate:  [66-85] 70 (02/07 0828) Resp:  [11-21] 16 (02/07 0828) BP: (100-130)/(43-63) 126/52 (02/07 0828) SpO2:  [95 %-100 %] 98 % (02/07 0828) Weight:  [117 kg-117.5 kg] 117 kg (02/07 0500) Last BM Date: 08/10/18 General:   Alert,  Well-developed, well-nourished, pleasant and cooperative in NAD Head:  Normocephalic and atraumatic. Eyes:  Sclera clear, no icterus.   Conjunctiva pink. Ears:  Normal auditory acuity. Nose:  No deformity, discharge,  or lesions. Mouth:  No deformity or lesions.   Neck:  Supple; no masses or thyromegaly. Lungs:  Clear throughout to auscultation.   No wheezes, crackles, or rhonchi. Heart:  Regular rate and rhythm; no murmurs, clicks, rubs,  or gallops. Abdomen:  Soft,nontender, BS active,nonpalp mass or hsm.   Rectal:  Deferred  Msk:  Symmetrical without gross deformities. . Pulses:  Normal pulses noted. Extremities:  Without clubbing or edema. Neurologic:  Alert and  oriented x4;  grossly normal neurologically. Skin:  Intact without significant lesions or rashes.. Psych:  Alert and cooperative. Normal mood and affect.  Intake/Output from previous day: 02/06 0701 - 02/07 0700 In: 863 [I.V.:125; Blood:738] Out: -  Intake/Output this shift: Total I/O In: -  Out: 1400 [Urine:1400]  Lab Results: Recent Labs    08/11/18 1908 08/11/18 2041 08/12/18 0653  WBC 10.8* 10.9* 9.3  HGB 5.0* 4.9* 6.6*  HCT 19.6* 18.9* 23.2*  PLT 335 328 287   BMET Recent Labs    08/11/18 1419 08/12/18 0653  NA 138 137  K 3.6 3.7  CL 99 102  CO2 27 27  GLUCOSE 92 96  BUN 31* 29*  CREATININE 1.12 1.02*  CALCIUM 8.5 8.4*   LFT Recent Labs     08/11/18 1419  ALBUMIN 3.5   PT/INR Recent Labs    08/11/18 2100  LABPROT 19.4*  INR 1.66      IMPRESSION:  #60 84 year old white female with recurrent profound anemia, symptomatic with weakness and shortness of breath, but no overt evidence for GI bleeding.  She has had previously documented multiple colonic AVMs which have been treated with APC in September 2019 and also in 2016. She very likely has intermittent grade GI bleeding, and/or ongoing microscopic GI blood loss from known AVMs. Certainly anticoagulation may exacerbate the blood loss.  Patient has not had her small bowel evaluated and may have additional small bowel AVMs.  She probably has not had adequate IV iron replacement and is intolerant to oral iron.  #2 unprovoked bilateral pulmonary emboli December 2018-on Eliquis since.  Question underlying hypercoagulable disorder  #3 obesity #4.  Congestive heart failure with preserved EF #5.  Pretension #6.  Fibromyalgia #7.  History of multiple adenomatous and sessile serrated polyps-no recurrent polyps as of 03/2018  Plan ; transfuse additional 2 units of packed RBCs  today IV Feraheme x1, and repeat as outpatient in 1 week.  Patient may need intermittent outpatient IV iron infusions  Do not feel that endoscopic evaluation is necessary at this time.  We may consider outpatient capsule endoscopy  Will place her on a regular diet Stop IV Protonix, will place on oral Protonix 40 mg once daily  Eliquis is on hold now-  Risk-benefit determination will need to be made regarding continuing long term anticoagulation.  She can be placed back on Eliquis on discharge, until she has hematology evaluation. Outpatient hematology evaluation will be helpful, to determine if she has a hypercoagulable disorder, and also to manage her ongoing iron deficiency anemia.  She will need serial hemoglobins load after discharge initially every 2 to 3 weeks.  Amy Esterwood  PA-C 08/12/2018,  11:15 AM  GI ATTENDING  History, laboratories, x-rays, prior endoscopy reports personally reviewed.  Patient personally seen and examined.  Agree with comprehensive consultation note as outlined above.  Impression: 1.  Recurrent symptomatic iron deficiency anemia secondary to chronic blood loss from gastrointestinal AVMs.  This in the face of chronic anticoagulation therapy for history of pulmonary embolus remotely.  Recent therapeutic colonoscopy as described.  No acute or subacute bleeding. 2.  History of pulmonary embolus December 2018.  On anticoagulation therapy since.  Etiology of blood clots was not determined.  No work-up was pursued regarding a potential clotting disorder as the PE was deemed unprovoked.  Recommendations: 1.  Transfused an appropriate hemoglobin.  Being done 2.  This patient will need chronic iron therapy in some effective form.  I believe that her previous problems with diarrhea (which led to the concurrent discontinuation of iron and colchicine) was due to colchicine not iron.  I would recommend she try oral iron and and if tolerated continue; if not tolerated for some reason or it is not effective then she will need periodic iron infusion therapy to assist with bone marrow red blood cell production 3.  Hematology evaluation.  This can be done as outpatient.  The purpose is twofold.  First, ascertain if the patient has underlying clotting disorder for which long-term anticoagulation would be strongly recommended given the history of pulmonary embolus.  If not, then the specific question is "is there benefit to maintaining oral anticoagulation therapy beyond 1 year?".  If so continue.  If not, then an excellent reason to stop anticoagulation therapy and potentially repeat benefits of decreased  GI blood loss with resultant anemia and hospitalization. 4.  I do not believe she needs endoscopic intervention at this time. I have discussed all of this in detail with the patient.   She understands.  Questions answered.  GI inpatient team will see her tomorrow and help with arrangements and coordination of care as outlined above.  Thank you  Docia Chuck. Geri Seminole., M.D. [facility] Division of Gastroenterology

## 2018-08-13 ENCOUNTER — Inpatient Hospital Stay (HOSPITAL_COMMUNITY): Payer: Medicare Other

## 2018-08-13 DIAGNOSIS — M533 Sacrococcygeal disorders, not elsewhere classified: Secondary | ICD-10-CM

## 2018-08-13 DIAGNOSIS — D500 Iron deficiency anemia secondary to blood loss (chronic): Secondary | ICD-10-CM

## 2018-08-13 DIAGNOSIS — Z7901 Long term (current) use of anticoagulants: Secondary | ICD-10-CM

## 2018-08-13 LAB — TYPE AND SCREEN
ABO/RH(D): B POS
Antibody Screen: NEGATIVE
UNIT DIVISION: 0
Unit division: 0
Unit division: 0
Unit division: 0

## 2018-08-13 LAB — BPAM RBC
BLOOD PRODUCT EXPIRATION DATE: 202003032359
Blood Product Expiration Date: 202002282359
Blood Product Expiration Date: 202002282359
Blood Product Expiration Date: 202002282359
ISSUE DATE / TIME: 202002062056
ISSUE DATE / TIME: 202002071326
ISSUE DATE / TIME: 202002071838
ISSUE DATE / TIME: 202002070048
Unit Type and Rh: 7300
Unit Type and Rh: 7300
Unit Type and Rh: 7300
Unit Type and Rh: 7300

## 2018-08-13 LAB — CBC
HCT: 29 % — ABNORMAL LOW (ref 36.0–46.0)
Hemoglobin: 8.4 g/dL — ABNORMAL LOW (ref 12.0–15.0)
MCH: 22.7 pg — ABNORMAL LOW (ref 26.0–34.0)
MCHC: 29 g/dL — ABNORMAL LOW (ref 30.0–36.0)
MCV: 78.4 fL — ABNORMAL LOW (ref 80.0–100.0)
Platelets: 305 10*3/uL (ref 150–400)
RBC: 3.7 MIL/uL — ABNORMAL LOW (ref 3.87–5.11)
RDW: 19.1 % — ABNORMAL HIGH (ref 11.5–15.5)
WBC: 11 10*3/uL — ABNORMAL HIGH (ref 4.0–10.5)
nRBC: 0.3 % — ABNORMAL HIGH (ref 0.0–0.2)

## 2018-08-13 LAB — BASIC METABOLIC PANEL
Anion gap: 9 (ref 5–15)
BUN: 28 mg/dL — ABNORMAL HIGH (ref 8–23)
CO2: 30 mmol/L (ref 22–32)
Calcium: 8.4 mg/dL — ABNORMAL LOW (ref 8.9–10.3)
Chloride: 100 mmol/L (ref 98–111)
Creatinine, Ser: 1.11 mg/dL — ABNORMAL HIGH (ref 0.44–1.00)
GFR calc Af Amer: 54 mL/min — ABNORMAL LOW (ref >60)
GFR, EST NON AFRICAN AMERICAN: 47 mL/min — AB (ref >60)
Glucose, Bld: 96 mg/dL (ref 70–99)
Potassium: 3.2 mmol/L — ABNORMAL LOW (ref 3.5–5.1)
SODIUM: 139 mmol/L (ref 135–145)

## 2018-08-13 LAB — GLUCOSE, CAPILLARY: Glucose-Capillary: 98 mg/dL (ref 70–99)

## 2018-08-13 LAB — HAPTOGLOBIN: Haptoglobin: 189 mg/dL (ref 41–333)

## 2018-08-13 MED ORDER — OXYCODONE-ACETAMINOPHEN 5-325 MG PO TABS
1.0000 | ORAL_TABLET | ORAL | Status: DC | PRN
  Administered 2018-08-13: 1 via ORAL
  Administered 2018-08-13 – 2018-08-15 (×7): 2 via ORAL
  Filled 2018-08-13 (×6): qty 2
  Filled 2018-08-13: qty 1
  Filled 2018-08-13 (×2): qty 2

## 2018-08-13 MED ORDER — MORPHINE SULFATE (PF) 2 MG/ML IV SOLN
2.0000 mg | INTRAVENOUS | Status: DC | PRN
  Administered 2018-08-13 – 2018-08-14 (×3): 4 mg via INTRAVENOUS
  Administered 2018-08-14: 2 mg via INTRAVENOUS
  Administered 2018-08-15: 4 mg via INTRAVENOUS
  Administered 2018-08-15: 2 mg via INTRAVENOUS
  Filled 2018-08-13: qty 2
  Filled 2018-08-13: qty 1
  Filled 2018-08-13 (×4): qty 2

## 2018-08-13 MED ORDER — MORPHINE SULFATE (PF) 2 MG/ML IV SOLN
2.0000 mg | Freq: Once | INTRAVENOUS | Status: AC
  Administered 2018-08-13: 2 mg via INTRAVENOUS
  Filled 2018-08-13: qty 1

## 2018-08-13 MED ORDER — POTASSIUM CHLORIDE CRYS ER 20 MEQ PO TBCR
40.0000 meq | EXTENDED_RELEASE_TABLET | Freq: Once | ORAL | Status: AC
  Administered 2018-08-13: 40 meq via ORAL
  Filled 2018-08-13: qty 2

## 2018-08-13 NOTE — Progress Notes (Signed)
[facility] PROGRESS NOTE  Annalis Kaczmarczyk UKG:[id number] DOB: April 23, 1935 DOA: 08/11/2018  PCP: Venia Carbon, MD  Brief History/Interval Summary: 84 y.o. female with medical history significant for morbid obesity, heart failure with preserved ejection fraction, DVT/PE on Eliquis, hypertension and history of GI bleed with known gastritis, diverticular disease, colonic polyps and colonic angiodysplasia presented from her PCP office for symptomatic anemia.  Her most recent GI bleed was in September 2019, at which time colonoscopy demonstrated diverticular disease internal hemorrhoids and angiodysplastic lesions.  She does note a 30 pound weight gain over the last 6 months that is associated with inconsistent use of Lasix.  Hemoglobin was noted to be 5.  Patient was hospitalized for further management.  Reason for Visit: Symptomatic anemia  Consultants: Gastroenterology  Procedures: None yet  Antibiotics: None  Subjective/Interval History: Patient states that she has developed of severe pain in her left hip and lower back area.  She mentions that she has a history of sacroiliitis.  Her last injection was to the right SI joint in December.  Next injection is due in March to the left SI joint.  Dr. Marcelino Scot is her orthopedic surgeon.  Has not had any bowel movements.  Feels better overall after her blood transfusion.  ROS: Denies any nausea or vomiting  Objective:  Vital Signs  Vitals:   08/12/18 1832 08/12/18 1859 08/12/18 2132 08/13/18 0526  BP: (!) 92/48 (!) 94/59 (!) 107/58 (!) 107/46  Pulse: 78 80 82 70  Resp: 20 16 16 16   Temp: 98.3 F (36.8 C) 98.4 F (36.9 C) 98.6 F (37 C) 97.8 F (36.6 C)  TempSrc: Oral Oral Oral   SpO2: 98% 93% 94% 100%  Weight:    115.8 kg  Height:        Intake/Output Summary (Last 24 hours) at 08/13/2018 1300 Last data filed at 08/13/2018 0600 Gross per 24 hour  Intake 1172 ml  Output 3125 ml  Net -1953 ml   Filed Weights   08/11/18  1852 08/12/18 0500 08/13/18 0526  Weight: 117.5 kg 117 kg 115.8 kg   General appearance: Awake alert.  In no distress Resp: Clear to auscultation bilaterally.  Normal effort Cardio: S1-S2 is normal regular.  No S3-S4.  No rubs murmurs or bruit GI: Abdomen is soft.  Nontender nondistended.  Bowel sounds are present normal.  No masses organomegaly Extremities: Restricted range of motion of both hips left more than right. Neurologic: Alert and oriented x3.  No focal neurological deficits.   Lab Results:  Data Reviewed: I have personally reviewed following labs and imaging studies  CBC: Recent Labs  Lab 08/11/18 1419 08/11/18 1908 08/11/18 2041 08/12/18 0653 08/12/18 1756 08/13/18 0551  WBC 9.9 10.8* 10.9* 9.3  --  11.0*  HGB 5.1 Repeated and verified X2.* 5.0* 4.9* 6.6* 7.6* 8.4*  HCT 17.4 Repeated and verified X2.* 19.6* 18.9* 23.2* 27.2* 29.0*  MCV 66.3* 75.1* 74.7* 76.3*  --  78.4*  PLT 346.0 335 328 287  --  762    Basic Metabolic Panel: Recent Labs  Lab 08/11/18 1419 08/11/18 2100 08/12/18 0653 08/13/18 0551  NA 138  --  137 139  K 3.6  --  3.7 3.2*  CL 99  --  102 100  CO2 27  --  27 30  GLUCOSE 92  --  96 96  BUN 31*  --  29* 28*  CREATININE 1.12  --  1.02* 1.11*  CALCIUM 8.5  --  8.4*  8.4*  MG  --  2.1  --   --   PHOS 4.0 3.7  --   --     GFR: Estimated Creatinine Clearance: 47.9 mL/min (A) (by C-G formula based on SCr of 1.11 mg/dL (H)).  Liver Function Tests: Recent Labs  Lab 08/11/18 1419  ALBUMIN 3.5    Coagulation Profile: Recent Labs  Lab 08/11/18 2100  INR 1.66    CBG: Recent Labs  Lab 08/12/18 0756 08/13/18 0801  GLUCAP 90 98    Anemia Panel: Recent Labs    08/11/18 2057 08/11/18 2058 08/11/18 2100  VITAMINB12  --  477  --   FERRITIN  --  2*  --   TIBC 456*  --   --   IRON 11*  --   --   RETICCTPCT  --   --  3.4*     Radiology Studies: Dg Pelvis Portable  Result Date: 08/13/2018 CLINICAL DATA:  Pain EXAM: PORTABLE  PELVIS 1-2 VIEWS COMPARISON:  MR pelvis April 03, 2017 FINDINGS: There is no evidence of pelvic fracture or diastasis. There is evidence of bilateral sacroiliitis. There is symmetric narrowing of each hip joint, moderate. IMPRESSION: Changes of sacroiliitis bilaterally. Question underlying seronegative spondyloarthropathy. There Is moderate symmetric narrowing of each hip joint. No acute fracture or dislocation. Electronically Signed   By: Lowella Grip III M.D.   On: 08/13/2018 10:26   Dg Chest Portable 1 View  Result Date: 08/11/2018 CLINICAL DATA:  Exertional shortness of breath, weakness, and dizziness. Former smoker. History of hypertension and congestive heart failure. EXAM: PORTABLE CHEST 1 VIEW COMPARISON:  03/21/2018 FINDINGS: Shallow inspiration. Cardiac enlargement. No vascular congestion, edema, or consolidation. No blunting of costophrenic angles. No pneumothorax. Mediastinal contours appear intact. Calcification of the aorta. Degenerative changes in the shoulder with diffuse heterogeneous lucent and sclerotic changes in the right humeral head. This could represent degenerative change, bone lesion, or old fracture deformity. No change since prior study. IMPRESSION: Cardiac enlargement. No evidence of active pulmonary disease. Electronically Signed   By: Lucienne Capers M.D.   On: 08/11/2018 19:43     Medications:  Scheduled: . sodium chloride   Intravenous Once  . gabapentin  300 mg Oral Daily  . gabapentin  600 mg Oral QHS  . pantoprazole  40 mg Oral Q0600   Continuous:  KWI:[id number], ondansetron **OR** ondansetron (ZOFRAN) IV, traMADol    Assessment/Plan:  Symptomatic anemia/likely secondary to chronic GI blood loss/iron deficiency Patient has not noticed any overt GI bleed.  No melanotic stools or bright red blood per rectum.  Hemoglobin was 4.9 at presentation.  Patient has been transfused a total of 4 units of PRBC.  Hemoglobin has responded appropriately.   Patient was also given IV iron.  Ferritin was noted to be 2.  This is all secondary to chronic GI loss.   Chronic GI bleed Patient with known history of diverticular disease, internal hemorrhoids and angiodysplastic lesions on colonoscopy done last in September 2019.  This is in the setting of anticoagulation with Eliquis for history of PE.  Gastroenterology consulted.  No plans for endoscopy at this time.  Continue to hold Eliquis.  Hopefully can resume at the time of discharge.   Left hip and back pain/Bilateral sacroiliitis. Patient with history of sacroiliitis.  She has undergone joint injections previously.  Pelvic x-ray does not show any acute fractures but does show evidence for sacroiliitis.  We will give her narcotic agents for now.  May need to consider  systemic steroids.  PT and OT evaluation.  If she does not improve then may have to request IR to do intra-articular injection on Monday.  History of pulmonary embolism This was in December 2018.  This was an unprovoked episode.  Eliquis currently on hold.  Patient will benefit from seeing a hematologist in the outpatient setting for both her anemia as well as to determine if continuing anticoagulation is warranted.  Apparently patient prefers to see Dr. Lindi Adie.  We will try to arrange this at discharge.  Acute on chronic diastolic CHF EF is normal based on echo done in December 2018.  Grade 1 diastolic dysfunction was noted.  Patient apparently has gained some weight over the last few months due to noncompliance with Lasix.  No overt pulmonary edema noted on chest x-ray.  MDD now.  She was given a dose of Lasix yesterday.  She had good diuresis.  Weight has decreased.  Weight is now 115.8 kg.  Tomorrow for further doses of diuretics.  She weighed 113 kg in January.  Weight was similar back in September 2019.    DVT Prophylaxis: SCDs    Code Status: Full code Family Communication: Discussed with the patient Disposition Plan: Management as  outlined above.  Patient unable to get out of bed due to her severe hip/back pain.  PT and OT evaluation.  Hemoglobin has improved.    LOS: 2 days   [facility] Pager on [email or web address]  08/13/2018, 1:00 PM

## 2018-08-13 NOTE — Progress Notes (Signed)
[facility] Progress Note  Chief Complaint: Iron deficiency anemia  History:  There is been no overt GI bleeding.  She is complaining of severe lower back pain.  Hospitalist note indicates history of sacroiliitis and treatment underway. She denies abdominal pain.  She can focus on little else besides the back pain. She received a dose of Feraheme today. ROS: Cardiovascular:  no chest pain Respiratory: no dyspnea  Objective:  Med list reviewed  Vital signs in last 24 hrs: Vitals:   08/13/18 0526 08/13/18 1356  BP: (!) 107/46 (!) 127/59  Pulse: 70 79  Resp: 16   Temp: 97.8 F (36.6 C) 98.5 F (36.9 C)  SpO2: 100% 100%    Physical Exam Morbidly obese woman laying in bed, visibly uncomfortable  Cardiac: RRR without murmurs, S1S2 heard, + peripheral edema  Pulm: clear to auscultation bilaterally, normal RR and effort noted  Abdomen: soft, no tenderness, with active bowel sounds.  Cannot assess hepatosplenomegaly due to body habitus  Skin; warm and dry, no jaundice, pale  Recent Labs:  CBC Latest Ref Rng & Units 08/13/2018 08/12/2018 08/12/2018  WBC 4.0 - 10.5 K/uL 11.0(H) - 9.3  Hemoglobin 12.0 - 15.0 g/dL 8.4(L) 7.6(L) 6.6(LL)  Hematocrit 36.0 - 46.0 % 29.0(L) 27.2(L) 23.2(L)  Platelets 150 - 400 K/uL 305 - 287    Recent Labs  Lab 08/11/18 2100  INR 1.66   CMP Latest Ref Rng & Units 08/13/2018 08/12/2018 08/11/2018  Glucose 70 - 99 mg/dL 96 96 92  BUN 8 - 23 mg/dL 28(H) 29(H) 31(H)  Creatinine 0.44 - 1.00 mg/dL 1.11(H) 1.02(H) 1.12  Sodium 135 - 145 mmol/L 139 137 138  Potassium 3.5 - 5.1 mmol/L 3.2(L) 3.7 3.6  Chloride 98 - 111 mmol/L 100 102 99  CO2 22 - 32 mmol/L 30 27 27   Calcium 8.9 - 10.3 mg/dL 8.4(L) 8.4(L) 8.5  Total Protein 6.5 - 8.1 g/dL - - -  Total Bilirubin 0.3 - 1.2 mg/dL - - -  Alkaline Phos 38 - 126 U/L - - -  AST 15 - 41 U/L - - -  ALT 0 - 44 U/L - - -   @ASSESSMENTPLANBEGIN @ Assessment: Acute on chronic iron deficiency anemia from chronic occult  GI blood loss.  Previous extensive work-up, no current plans for repeat endoscopy.  Long-term use anticoagulation from pulmonary embolism.  Please see Dr. Blanch Media consult note from yesterday for his opinion regarding ongoing management, especially regarding need for outpatient hematology evaluation to address the question of whether or not patient should continue long-term anticoagulation.  She has received transfusion on IV iron and hemoglobin should be watched closely in the outpatient setting.  From our perspective, the patient's anticoagulation can be resumed now if necessary, as she was not having overt GI bleeding prior to or during hospital stay.  Signing off, call as needed.  Nelida Meuse III Office: [phone]

## 2018-08-14 LAB — BASIC METABOLIC PANEL
Anion gap: 10 (ref 5–15)
BUN: 27 mg/dL — ABNORMAL HIGH (ref 8–23)
CHLORIDE: 99 mmol/L (ref 98–111)
CO2: 28 mmol/L (ref 22–32)
Calcium: 8.4 mg/dL — ABNORMAL LOW (ref 8.9–10.3)
Creatinine, Ser: 1.15 mg/dL — ABNORMAL HIGH (ref 0.44–1.00)
GFR calc Af Amer: 52 mL/min — ABNORMAL LOW (ref >60)
GFR calc non Af Amer: 45 mL/min — ABNORMAL LOW (ref >60)
Glucose, Bld: 82 mg/dL (ref 70–99)
Potassium: 3.2 mmol/L — ABNORMAL LOW (ref 3.5–5.1)
Sodium: 137 mmol/L (ref 135–145)

## 2018-08-14 LAB — CBC
HCT: 32.5 % — ABNORMAL LOW (ref 36.0–46.0)
Hemoglobin: 9 g/dL — ABNORMAL LOW (ref 12.0–15.0)
MCH: 22.2 pg — AB (ref 26.0–34.0)
MCHC: 27.7 g/dL — ABNORMAL LOW (ref 30.0–36.0)
MCV: 80.2 fL (ref 80.0–100.0)
Platelets: 320 10*3/uL (ref 150–400)
RBC: 4.05 MIL/uL (ref 3.87–5.11)
RDW: 20.8 % — ABNORMAL HIGH (ref 11.5–15.5)
WBC: 17.1 10*3/uL — ABNORMAL HIGH (ref 4.0–10.5)
nRBC: 0 % (ref 0.0–0.2)

## 2018-08-14 LAB — GLUCOSE, CAPILLARY: Glucose-Capillary: 101 mg/dL — ABNORMAL HIGH (ref 70–99)

## 2018-08-14 MED ORDER — POTASSIUM CHLORIDE CRYS ER 20 MEQ PO TBCR
40.0000 meq | EXTENDED_RELEASE_TABLET | Freq: Once | ORAL | Status: AC
  Administered 2018-08-14: 40 meq via ORAL
  Filled 2018-08-14: qty 2

## 2018-08-14 MED ORDER — PREDNISONE 50 MG PO TABS
60.0000 mg | ORAL_TABLET | Freq: Two times a day (BID) | ORAL | Status: DC
  Administered 2018-08-14 – 2018-08-16 (×4): 60 mg via ORAL
  Filled 2018-08-14 (×4): qty 1

## 2018-08-14 MED ORDER — FUROSEMIDE 10 MG/ML IJ SOLN
20.0000 mg | Freq: Once | INTRAMUSCULAR | Status: AC
  Administered 2018-08-14: 20 mg via INTRAVENOUS
  Filled 2018-08-14: qty 2

## 2018-08-14 MED ORDER — APIXABAN 2.5 MG PO TABS
2.5000 mg | ORAL_TABLET | Freq: Two times a day (BID) | ORAL | Status: DC
  Administered 2018-08-14 – 2018-08-16 (×5): 2.5 mg via ORAL
  Filled 2018-08-14 (×5): qty 1

## 2018-08-14 MED ORDER — METHYLPREDNISOLONE SODIUM SUCC 125 MG IJ SOLR
60.0000 mg | Freq: Once | INTRAMUSCULAR | Status: AC
  Administered 2018-08-14: 60 mg via INTRAVENOUS
  Filled 2018-08-14: qty 2

## 2018-08-14 NOTE — Progress Notes (Addendum)
[facility] PROGRESS NOTE  Chelsea Keller YKD:[id number] DOB: 1935-01-28 DOA: 08/11/2018  PCP: Venia Carbon, MD  Brief History/Interval Summary: 84 y.o. female with medical history significant for morbid obesity, heart failure with preserved ejection fraction, DVT/PE on Eliquis, hypertension and history of GI bleed with known gastritis, diverticular disease, colonic polyps and colonic angiodysplasia presented from her PCP office for symptomatic anemia.  Her most recent GI bleed was in September 2019, at which time colonoscopy demonstrated diverticular disease internal hemorrhoids and angiodysplastic lesions.  She does note a 30 pound weight gain over the last 6 months that is associated with inconsistent use of Lasix.  Hemoglobin was noted to be 5.  Patient was hospitalized for further management.  Reason for Visit: Symptomatic anemia  Consultants: Gastroenterology  Procedures: None yet  Antibiotics: None  Subjective/Interval History: Patient continues to have significant discomfort in both her hips, left more than right.  She also is complaining of pain in her left wrist area.  She does have a history of gout.    ROS: Denies any nausea or vomiting  Objective:  Vital Signs  Vitals:   08/13/18 1356 08/13/18 2128 08/13/18 2129 08/14/18 0545  BP: (!) 127/59 (!) 118/40  119/86  Pulse: 79 (!) 46 88 80  Resp:  (!) 22  16  Temp: 98.5 F (36.9 C) 98.6 F (37 C)  98.6 F (37 C)  TempSrc: Oral Oral  Oral  SpO2: 100% 100% 100% 99%  Weight:    117.8 kg  Height:        Intake/Output Summary (Last 24 hours) at 08/14/2018 1036 Last data filed at 08/14/2018 0300 Gross per 24 hour  Intake 540 ml  Output 950 ml  Net -410 ml   Filed Weights   08/12/18 0500 08/13/18 0526 08/14/18 0545  Weight: 117 kg 115.8 kg 117.8 kg   General appearance: Awake alert.  In no distress.  Morbidly obese Resp: Clear to auscultation bilaterally.  Normal effort Cardio: S1-S2 is normal regular.   No S3-S4.  No rubs murmurs or bruit GI: Abdomen is soft.  Nontender nondistended.  Bowel sounds are present normal.  No masses organomegaly Extremities: Restricted range of motion of both hips, left more than right Neurologic: Alert and oriented x3.  No focal neurological deficits.    Lab Results:  Data Reviewed: I have personally reviewed following labs and imaging studies  CBC: Recent Labs  Lab 08/11/18 1908 08/11/18 2041 08/12/18 0653 08/12/18 1756 08/13/18 0551 08/14/18 0626  WBC 10.8* 10.9* 9.3  --  11.0* 17.1*  HGB 5.0* 4.9* 6.6* 7.6* 8.4* 9.0*  HCT 19.6* 18.9* 23.2* 27.2* 29.0* 32.5*  MCV 75.1* 74.7* 76.3*  --  78.4* 80.2  PLT 335 328 287  --  305 397    Basic Metabolic Panel: Recent Labs  Lab 08/11/18 1419 08/11/18 2100 08/12/18 0653 08/13/18 0551 08/14/18 0626  NA 138  --  137 139 137  K 3.6  --  3.7 3.2* 3.2*  CL 99  --  102 100 99  CO2 27  --  '27 30 28  '$ GLUCOSE 92  --  96 96 82  BUN 31*  --  29* 28* 27*  CREATININE 1.12  --  1.02* 1.11* 1.15*  CALCIUM 8.5  --  8.4* 8.4* 8.4*  MG  --  2.1  --   --   --   PHOS 4.0 3.7  --   --   --     GFR: Estimated  Creatinine Clearance: 46.8 mL/min (A) (by C-G formula based on SCr of 1.15 mg/dL (H)).  Liver Function Tests: Recent Labs  Lab 08/11/18 1419  ALBUMIN 3.5    Coagulation Profile: Recent Labs  Lab 08/11/18 2100  INR 1.66    CBG: Recent Labs  Lab 08/12/18 0756 08/13/18 0801 08/14/18 0730  GLUCAP 90 98 101*    Anemia Panel: Recent Labs    08/11/18 2057 08/11/18 2058 08/11/18 2100  VITAMINB12  --  477  --   FERRITIN  --  2*  --   TIBC 456*  --   --   IRON 11*  --   --   RETICCTPCT  --   --  3.4*     Radiology Studies: Dg Pelvis Portable  Result Date: 08/13/2018 CLINICAL DATA:  Pain EXAM: PORTABLE PELVIS 1-2 VIEWS COMPARISON:  MR pelvis April 03, 2017 FINDINGS: There is no evidence of pelvic fracture or diastasis. There is evidence of bilateral sacroiliitis. There is symmetric  narrowing of each hip joint, moderate. IMPRESSION: Changes of sacroiliitis bilaterally. Question underlying seronegative spondyloarthropathy. There Is moderate symmetric narrowing of each hip joint. No acute fracture or dislocation. Electronically Signed   By: Lowella Grip III M.D.   On: 08/13/2018 10:26     Medications:  Scheduled: . sodium chloride   Intravenous Once  . gabapentin  300 mg Oral Daily  . gabapentin  600 mg Oral QHS  . methylPREDNISolone (SOLU-MEDROL) injection  60 mg Intravenous Once  . pantoprazole  40 mg Oral Q0600  . potassium chloride  40 mEq Oral Once  . predniSONE  60 mg Oral BID WC   Continuous:  NAT:FTDDUKGURKYHC, morphine injection, ondansetron **OR** ondansetron (ZOFRAN) IV, oxyCODONE-acetaminophen    Assessment/Plan:  Symptomatic anemia/likely secondary to chronic GI blood loss/iron deficiency Patient has not noticed any overt GI bleed.  No melanotic stools or bright red blood per rectum.  Hemoglobin was 4.9 at presentation.  Patient has been transfused a total of 4 units of PRBC.  Hemoglobin has responded appropriately.  Patient was also given IV iron.  Ferritin was noted to be 2.  This is thought to be secondary to chronic GI loss.  Hemoglobin remained stable.  Chronic GI bleed Patient with known history of diverticular disease, internal hemorrhoids and angiodysplastic lesions on colonoscopy done last in September 2019.  This is in the setting of anticoagulation with Eliquis for history of PE.  Gastroenterology consulted.  No plans for endoscopy at this time.  Gastroenterology has signed off.  Okay to resume Eliquis.  Left hip and back pain/Bilateral sacroiliitis. Patient with history of sacroiliitis.  She has undergone joint injections previously.  Pelvic x-ray does not show any acute fractures but does show evidence for sacroiliitis.  Patient also complains of left wrist pain.  Patient has a history of gout.  So there could be a component of acute  gout as well.  We will give her systemic steroids since there has been no significant improvement.  PT and OT evaluation.    History of pulmonary embolism This was in December 2018.  This was an unprovoked episode.  Eliquis currently on hold.  Patient will benefit from seeing a hematologist in the outpatient setting for both her anemia as well as to determine if continuing anticoagulation is warranted.  Apparently patient prefers to see Dr. Lindi Adie.  Apparently her daughter goes to the same hematologist.  Daughter also has a history of blood clots.  In view of this it might be  prudent to continue with anticoagulation for now.  Gastroenterology has cleared for the patient to resume Eliquis.  Discussed with patient and her daughter this morning.  They are agreeable to resume also.  Continued use of anticoagulation will need to be decided based on discussions with the patient's primary care provider as well as hematology consultation.    Acute on chronic diastolic CHF EF is normal based on echo done in December 2018.  Grade 1 diastolic dysfunction was noted.  Patient apparently has gained some weight over the last few months due to noncompliance with Lasix.  No overt pulmonary edema noted on chest x-ray.  She was given a dose of Lasix on 2/7.  She had good diuresis.  Weight has decreased.  However appears to have gained some weight this morning.  Not as much urine output yesterday.  Will give additional dose of Lasix today.  She weighed 113 kg in January.  Weight was similar back in September 2019.    Hypokalemia This will be repleted.  Leukocytosis Reason is not entirely clear.  Could be due to acute inflammation.  Check ESR tomorrow.  DVT Prophylaxis: SCDs    Code Status: Full code Family Communication: Discussed with the patient and her daughter Disposition Plan: Management as outlined above.  Patient unable to get out of the bed due to her severe hip/back pain.  Steroids to be initiated today.  PT  and OT evaluation.      LOS: 3 days   Chelsea Keller [employer] on [email or web address]  08/14/2018, 10:36 AM

## 2018-08-15 LAB — SEDIMENTATION RATE: SED RATE: 35 mm/h — AB (ref 0–22)

## 2018-08-15 LAB — BASIC METABOLIC PANEL
ANION GAP: 9 (ref 5–15)
BUN: 35 mg/dL — ABNORMAL HIGH (ref 8–23)
CO2: 28 mmol/L (ref 22–32)
Calcium: 9 mg/dL (ref 8.9–10.3)
Chloride: 103 mmol/L (ref 98–111)
Creatinine, Ser: 1.15 mg/dL — ABNORMAL HIGH (ref 0.44–1.00)
GFR calc Af Amer: 52 mL/min — ABNORMAL LOW (ref >60)
GFR calc non Af Amer: 45 mL/min — ABNORMAL LOW (ref >60)
Glucose, Bld: 153 mg/dL — ABNORMAL HIGH (ref 70–99)
Potassium: 4.3 mmol/L (ref 3.5–5.1)
Sodium: 140 mmol/L (ref 135–145)

## 2018-08-15 LAB — FOLATE RBC
Folate, Hemolysate: 362 ng/mL
Folate, RBC: 1602 ng/mL (ref >498)
HEMATOCRIT: 22.6 % — AB (ref 34.0–46.6)

## 2018-08-15 LAB — CBC
HCT: 31.9 % — ABNORMAL LOW (ref 36.0–46.0)
HEMOGLOBIN: 8.8 g/dL — AB (ref 12.0–15.0)
MCH: 23 pg — ABNORMAL LOW (ref 26.0–34.0)
MCHC: 27.6 g/dL — ABNORMAL LOW (ref 30.0–36.0)
MCV: 83.5 fL (ref 80.0–100.0)
Platelets: 307 10*3/uL (ref 150–400)
RBC: 3.82 MIL/uL — ABNORMAL LOW (ref 3.87–5.11)
RDW: 21.5 % — ABNORMAL HIGH (ref 11.5–15.5)
WBC: 20.6 10*3/uL — ABNORMAL HIGH (ref 4.0–10.5)
nRBC: 0.1 % (ref 0.0–0.2)

## 2018-08-15 LAB — GLUCOSE, CAPILLARY: GLUCOSE-CAPILLARY: 145 mg/dL — AB (ref 70–99)

## 2018-08-15 NOTE — Plan of Care (Signed)
?  Problem: Elimination: ?Goal: Will not experience complications related to bowel motility ?Outcome: Progressing ?  ?Problem: Pain Managment: ?Goal: General experience of comfort will improve ?Outcome: Progressing ?  ?Problem: Safety: ?Goal: Ability to remain free from injury will improve ?Outcome: Progressing ?  ?

## 2018-08-15 NOTE — Evaluation (Signed)
Occupational Therapy Evaluation Patient Details Name: Chelsea Keller MRN: [id number] DOB: December 11, 1934 Today's Date: 08/15/2018    History of Present Illness Chelsea Keller is a 84 y.o. female with medical history significant for morbid obesity, heart failure with preserved ejection fraction, DVT/PE on Eliquis, hypertension and history of GI bleed with known gastritis, diverticular disease, colonic polyps and colonic angiodysplasia presenting from her PCP office for symptomatic anemia. Patient has been transfused a total of 4 units of PRBC.   Clinical Impression   Pt admitted with weakness. Pt currently with functional limitations due to the deficits listed below (see OT Problem List). Pt will benefit from skilled OT to increase their safety and independence with ADL and functional mobility for ADL to facilitate discharge to venue listed below.  Pt has family at home but currently needing increased level of A with ADL Activity.  Pt would benefit from post acute OT to increase I with ADL activity.     Follow Up Recommendations  SNF;Home health OT;Supervision/Assistance - 24 hour    Equipment Recommendations  None recommended by OT       Precautions / Restrictions Precautions Precautions: Fall Restrictions Weight Bearing Restrictions: No      Mobility Bed Mobility Overal bed mobility: Needs Assistance Bed Mobility: Supine to Sit     Supine to sit: Mod assist;+2 for physical assistance     General bed mobility comments: Mod A +2 to come to EOB; patient typically sleeps in recliner; poor motivation with consistent verbal cues  Transfers Overall transfer level: Needs assistance Equipment used: Rolling walker (2 wheeled) Transfers: Sit to/from [employer] Sit to Stand: Mod assist;+2 physical assistance;Min assist Stand pivot transfers: Mod assist;+2 physical assistance       General transfer comment: varying levels of assist needed; from bedside up to Mod A,  however at recliner up to [place] guard; consistent cueing for safety and sequencing    Balance Overall balance assessment: Needs assistance Sitting-balance support: Feet supported Sitting balance-Leahy Scale: Fair     Standing balance support: Bilateral upper extremity supported;During functional activity Standing balance-Leahy Scale: Poor                             ADL either performed or assessed with clinical judgement   ADL Overall ADL's : Needs assistance/impaired Eating/Feeding: Set up;Sitting   Grooming: Set up;Sitting   Upper Body Bathing: Minimal assistance;Sitting   Lower Body Bathing: Maximal assistance;Sit to/from stand;Cueing for sequencing;Cueing for safety   Upper Body Dressing : Minimal assistance;Sitting   Lower Body Dressing: Maximal assistance;Sit to/from stand;Cueing for sequencing;Cueing for safety   Toilet Transfer: Moderate assistance;RW;BSC;Stand-pivot   Toileting- Clothing Manipulation and Hygiene: +2 for physical assistance;+2 for safety/equipment;Sit to/from stand;Maximal assistance         General ADL Comments: Pt will need significant A with ADL activity at home.  Pt would benefit from SNF but states she will refuse                  Pertinent Vitals/Pain Pain Score: 5  Pain Location: L hip Pain Descriptors / Indicators: Aching;Discomfort Pain Intervention(s): Monitored during session;Repositioned;Limited activity within patient's tolerance;RN gave pain meds during session     Hand Dominance Right   Extremity/Trunk Assessment Upper Extremity Assessment Upper Extremity Assessment: Generalized weakness           Communication Communication Communication: No difficulties   Cognition Arousal/Alertness: Awake/alert Behavior During Therapy: WFL for tasks assessed/performed Overall  Cognitive Status: Within Functional Limits for tasks assessed                                                Home  Living Family/patient expects to be discharged to:: Private residence Living Arrangements: Children;Other relatives Available Help at Discharge: Family;Available 24 hours/day;Available PRN/intermittently Type of Home: House Home Access: Stairs to enter;Level entry Entrance Stairs-Number of Steps: 2 Entrance Stairs-Rails: None Home Layout: One level     Bathroom Shower/Tub: Occupational psychologist: Standard Bathroom Accessibility: No   Home Equipment: Bedside commode;Tub bench;Walker - 4 wheels;Cane - single point;Grab bars - tub/shower;Shower seat;Other (comment)   Additional Comments: stair lift      Prior Functioning/Environment Level of Independence: Independent with assistive device(s);Needs assistance  Gait / Transfers Assistance Needed: rollator for household ambulation ADL's / Homemaking Assistance Needed: IND with ADL - daughter would check after BM's for cleanliness            OT Problem List: Decreased strength;Decreased activity tolerance;Impaired balance (sitting and/or standing);Decreased safety awareness;Decreased knowledge of use of DME or AE;Decreased knowledge of precautions;Pain;Obesity      OT Treatment/Interventions: Self-care/ADL training;Patient/family education;Therapeutic activities;DME and/or AE instruction    OT Goals(Current goals can be found in the care plan section) Acute Rehab OT Goals Patient Stated Goal: "I am going home" OT Goal Formulation: With patient Time For Goal Achievement: 08/29/18 Potential to Achieve Goals: Good  OT Frequency: Min 2X/week              AM-PAC OT "6 Clicks" Daily Activity     Outcome Measure Help from another person eating meals?: None Help from another person taking care of personal grooming?: None Help from another person toileting, which includes using toliet, bedpan, or urinal?: A Lot Help from another person bathing (including washing, rinsing, drying)?: A Lot Help from another person to put  on and taking off regular upper body clothing?: A Little Help from another person to put on and taking off regular lower body clothing?: A Lot 6 Click Score: 17   End of Session Equipment Utilized During Treatment: Rolling walker;Gait belt Nurse Communication: Mobility status  Activity Tolerance: Patient limited by pain Patient left: in chair;with call bell/phone within reach;with chair alarm set  OT Visit Diagnosis: Unsteadiness on feet (R26.81);Other abnormalities of gait and mobility (R26.89);Repeated falls (R29.6);History of falling (Z91.81);Pain                Time: 1320-1355 OT Time Calculation (min): 35 min Charges:  OT General Charges $OT Visit: 1 Visit OT Evaluation $OT Eval Moderate Complexity: 1 Mod  Kari Baars, OT Acute Rehabilitation Services [phone] Office- [phone], Edwena Felty D 08/15/2018, 6:06 PM

## 2018-08-15 NOTE — Progress Notes (Signed)
08/15/2018  1400 Pt's daughter called. [phone] Returned daughter's called and LMOM.

## 2018-08-15 NOTE — Evaluation (Signed)
Physical Therapy Evaluation Patient Details Name: Chelsea Keller MRN: [id number] DOB: [birth year] Today's Date: 08/15/2018   History of Present Illness  Chelsea Keller is a 84 y.o. female with medical history significant for morbid obesity, heart failure with preserved ejection fraction, DVT/PE on Eliquis, hypertension and history of GI bleed with known gastritis, diverticular disease, colonic polyps and colonic angiodysplasia presenting from her PCP office for symptomatic anemia. Patient has been transfused a total of 4 units of PRBC.    Clinical Impression  Patient admitted with the above listed diagnosis. Patient reports Mod I with mobility with rollator prior to admission. Patient today requiring up to Mod A +2 for mobility with RW. Patient with reduced balance, strength, gait, posturing, and overall reduced safety awareness placing her at a high fall risk. Patient with tendency to lean over RW rather than use hands for support further increasing her fall risk. Due to limited functional mobility and need for physical assist will recommend short term SNF at discharge to progress safe and independent functional mobility.   Max education provided by PT/OT on safety and need for rehab at discharge, with patient declining. If patient to return home, will require [facility] services and 24/hr supervision.      Follow Up Recommendations SNF    Equipment Recommendations  None recommended by PT    Recommendations for Other Services       Precautions / Restrictions Precautions Precautions: Fall Restrictions Weight Bearing Restrictions: No      Mobility  Bed Mobility Overal bed mobility: Needs Assistance Bed Mobility: Supine to Sit     Supine to sit: Mod assist;+2 for physical assistance     General bed mobility comments: Mod A +2 to come to EOB; patient typically sleeps in recliner; poor motivation with consistent verbal cues  Transfers Overall transfer level: Needs assistance Equipment  used: Rolling walker (2 wheeled) Transfers: Sit to/from [facility] Sit to Stand: Mod assist;+2 physical assistance;Min assist Stand pivot transfers: Mod assist;+2 physical assistance       General transfer comment: varying levels of assist needed; from bedside up to Mod A, however at recliner up to [place] guard; consistent cueing for safety and sequencing  Ambulation/Gait Ambulation/Gait assistance: Mod assist Gait Distance (Feet): 5 Feet Assistive device: Rolling walker (2 wheeled) Gait Pattern/deviations: Step-to pattern;Decreased stride length;Decreased weight shift to left;Decreased weight shift to right;Antalgic;Trunk flexed Gait velocity: decreased   General Gait Details: continuously tries to rest L UE on RW handle rather than use hand for support with cueing to reduce this pattern. poor weight shift bilaterally with limited mobility with RW  Stairs            Wheelchair Mobility    Modified Rankin (Stroke Patients Only)       Balance Overall balance assessment: Needs assistance Sitting-balance support: Feet supported Sitting balance-Leahy Scale: Fair     Standing balance support: Bilateral upper extremity supported;During functional activity Standing balance-Leahy Scale: Poor                               Pertinent Vitals/Pain Pain Assessment: 0-10 Pain Score: 5 (5/10 at rest; 10/10 with mobility) Pain Location: L hip Pain Descriptors / Indicators: Aching;Discomfort Pain Intervention(s): Limited activity within patient's tolerance;Monitored during session;RN gave pain meds during session;Repositioned(patient behaviors not matching reported pain level)    Home Living Family/patient expects to be discharged to:: Private residence Living Arrangements: Children;Other relatives Available Help at Discharge: Family;Available  24 hours/day;Available PRN/intermittently Type of Home: House Home Access: Stairs to enter;Level entry Entrance  Stairs-Rails: None Entrance Stairs-Number of Steps: 2 Home Layout: One level Home Equipment: Bedside commode;Tub bench;Walker - 4 wheels;Cane - single point;Grab bars - tub/shower;Shower seat;Other (comment) Additional Comments: stair lift    Prior Function Level of Independence: Independent with assistive device(s);Needs assistance   Gait / Transfers Assistance Needed: rollator for household ambulation  ADL's / Homemaking Assistance Needed: IND with ADL - daughter would check after BM's for cleanliness  Comments: rollator for household ambulation, independent with ADL     Hand Dominance   Dominant Hand: Right    Extremity/Trunk Assessment   Upper Extremity Assessment Upper Extremity Assessment: Defer to OT evaluation    Lower Extremity Assessment Lower Extremity Assessment: Generalized weakness    Cervical / Trunk Assessment Cervical / Trunk Assessment: Kyphotic  Communication   Communication: No difficulties  Cognition Arousal/Alertness: Awake/alert Behavior During Therapy: WFL for tasks assessed/performed Overall Cognitive Status: Within Functional Limits for tasks assessed                                        General Comments General comments (skin integrity, edema, etc.): refusing SNF    Exercises     Assessment/Plan    PT Assessment Patient needs continued PT services  PT Problem List Decreased strength;Decreased activity tolerance;Decreased balance;Decreased mobility;Decreased knowledge of use of DME;Decreased safety awareness       PT Treatment Interventions DME instruction;Gait training;Stair training;Functional mobility training;Therapeutic activities;Therapeutic exercise;Balance training;Patient/family education    PT Goals (Current goals can be found in the Care Plan section)  Acute Rehab PT Goals Patient Stated Goal: "I am going home" PT Goal Formulation: With patient Time For Goal Achievement: 08/29/18 Potential to Achieve  Goals: Fair    Frequency Min 3X/week   Barriers to discharge        Co-evaluation PT/OT/SLP Co-Evaluation/Treatment: Yes Reason for Co-Treatment: Complexity of the patient's impairments (multi-system involvement);For patient/therapist safety;To address functional/ADL transfers PT goals addressed during session: Mobility/safety with mobility;Balance;Proper use of DME         AM-PAC PT "6 Clicks" Mobility  Outcome Measure Help needed turning from your back to your side while in a flat bed without using bedrails?: A Lot Help needed moving from lying on your back to sitting on the side of a flat bed without using bedrails?: A Lot Help needed moving to and from a bed to a chair (including a wheelchair)?: A Lot Help needed standing up from a chair using your arms (e.g., wheelchair or bedside chair)?: A Lot Help needed to walk in hospital room?: A Lot Help needed climbing 3-5 steps with a railing? : Total 6 Click Score: 11    End of Session Equipment Utilized During Treatment: Gait belt Activity Tolerance: Patient limited by pain;Patient limited by fatigue Patient left: in chair;with call bell/phone within reach;with chair alarm set Nurse Communication: Mobility status PT Visit Diagnosis: Unsteadiness on feet (R26.81);Other abnormalities of gait and mobility (R26.89);Muscle weakness (generalized) (M62.81)    Time: 6073-7106 PT Time Calculation (min) (ACUTE ONLY): 35 min   Charges:   PT Evaluation $PT Eval Moderate Complexity: 1 Mod          Lanney Gins, PT, DPT Supplemental Physical Therapist 08/15/18 2:07 PM Pager: [phone] Office: [phone]

## 2018-08-15 NOTE — Progress Notes (Signed)
[facility] PROGRESS NOTE  Chelsea Keller HYW:[id number] DOB: [birth year] DOA: 08/11/2018  PCP: Venia Carbon, MD  Brief History/Interval Summary: 84 y.o. female with medical history significant for morbid obesity, heart failure with preserved ejection fraction, DVT/PE on Eliquis, hypertension and history of GI bleed with known gastritis, diverticular disease, colonic polyps and colonic angiodysplasia presented from her PCP office for symptomatic anemia.  Her most recent GI bleed was in September 2019, at which time colonoscopy demonstrated diverticular disease internal hemorrhoids and angiodysplastic lesions.  She does note a 30 pound weight gain over the last 6 months that is associated with inconsistent use of Lasix.  Hemoglobin was noted to be 5.  Patient was hospitalized for further management.  Reason for Visit: Symptomatic anemia  Consultants: Gastroenterology  Procedures: None yet  Antibiotics: None  Subjective/Interval History: Patient states that her left wrist pain has improved.  Still has pain in both her hips and lower back but better than yesterday.  She feels that the steroids are helping.     ROS: Denies any nausea or vomiting  Objective:  Vital Signs  Vitals:   08/14/18 1500 08/14/18 2034 08/15/18 0558 08/15/18 0816  BP: 126/69 (!) 107/56 125/71   Pulse: 80 69 70   Resp:  20 20   Temp: 98.5 F (36.9 C) 98.2 F (36.8 C) (!) 97.5 F (36.4 C)   TempSrc: Oral Oral Oral   SpO2: 99% 96% 98% 95%  Weight:   118 kg   Height:        Intake/Output Summary (Last 24 hours) at 08/15/2018 1139 Last data filed at 08/15/2018 0941 Gross per 24 hour  Intake 360 ml  Output 900 ml  Net -540 ml   Filed Weights   08/13/18 0526 08/14/18 0545 08/15/18 0558  Weight: 115.8 kg 117.8 kg 118 kg   General appearance: Awake alert.  In no distress.  Morbidly obese Resp: Clear to auscultation bilaterally.  Normal effort Cardio: S1-S2 is normal regular.  No S3-S4.  No  rubs murmurs or bruit GI: Abdomen is soft.  Nontender nondistended.  Bowel sounds are present normal.  No masses organomegaly Extremities: Improved range of motion noted in both lower extremities and in the left wrist.   Neurologic: Alert and oriented x3.  No focal neurological deficits.     Lab Results:  Data Reviewed: I have personally reviewed following labs and imaging studies  CBC: Recent Labs  Lab 08/11/18 2041 08/12/18 0653 08/12/18 1756 08/13/18 0551 08/14/18 0626 08/15/18 0605  WBC 10.9* 9.3  --  11.0* 17.1* 20.6*  HGB 4.9* 6.6* 7.6* 8.4* 9.0* 8.8*  HCT 18.9* 23.2* 27.2* 29.0* 32.5* 31.9*  MCV 74.7* 76.3*  --  78.4* 80.2 83.5  PLT 328 287  --  305 320 485    Basic Metabolic Panel: Recent Labs  Lab 08/11/18 1419 08/11/18 2100 08/12/18 0653 08/13/18 0551 08/14/18 0626 08/15/18 0605  NA 138  --  137 139 137 140  K 3.6  --  3.7 3.2* 3.2* 4.3  CL 99  --  102 100 99 103  CO2 27  --  '27 30 28 28  '$ GLUCOSE 92  --  96 96 82 153*  BUN 31*  --  29* 28* 27* 35*  CREATININE 1.12  --  1.02* 1.11* 1.15* 1.15*  CALCIUM 8.5  --  8.4* 8.4* 8.4* 9.0  MG  --  2.1  --   --   --   --   PHOS  4.0 3.7  --   --   --   --     GFR: Estimated Creatinine Clearance: 46.8 mL/min (A) (by C-G formula based on SCr of 1.15 mg/dL (H)).  Liver Function Tests: Recent Labs  Lab 08/11/18 1419  ALBUMIN 3.5    Coagulation Profile: Recent Labs  Lab 08/11/18 2100  INR 1.66    CBG: Recent Labs  Lab 08/12/18 0756 08/13/18 0801 08/14/18 0730 08/15/18 0727  GLUCAP 90 98 101* 145*     Radiology Studies: No results found.   Medications:  Scheduled: . sodium chloride   Intravenous Once  . apixaban  2.5 mg Oral BID  . gabapentin  300 mg Oral Daily  . gabapentin  600 mg Oral QHS  . pantoprazole  40 mg Oral Q0600  . predniSONE  60 mg Oral BID WC   Continuous:  CVE:[id number], morphine injection, ondansetron **OR** ondansetron (ZOFRAN) IV,  oxyCODONE-acetaminophen    Assessment/Plan:  Symptomatic anemia/likely secondary to chronic GI blood loss/iron deficiency Patient had not noticed any overt GI bleed.  No melanotic stools or bright red blood per rectum.  Hemoglobin was 4.9 at presentation.  Patient has been transfused a total of 4 units of PRBC.  Hemoglobin responded appropriately.  Patient was also given IV iron.  Ferritin was noted to be 2.  This is thought to be secondary to chronic GI loss.  Hemoglobin has been stable.  No overt bleeding has been noted.  Chronic GI bleed Patient with known history of diverticular disease, internal hemorrhoids and angiodysplastic lesions on colonoscopy done last in September 2019.  This is in the setting of anticoagulation with Eliquis for history of PE.  Gastroenterology was consulted.  No plans for endoscopy at this time.  Gastroenterology has signed off.  Eliquis was resumed yesterday.  Left hip and back pain/Bilateral sacroiliitis. Patient with history of sacroiliitis.  She has undergone joint injections previously.  Pelvic x-ray does not show any acute fractures but does show evidence for sacroiliitis.  Patient also complains of left wrist pain.  Patient has a history of gout.  So there could be a component of acute gout as well.  Apparently there is plan for intra-articular injection in March.  She apparently cannot have this done any sooner than that.  Patient was started on systemic steroids with significant improvement.  Continue PPI. Waiting on PT and OT evaluation.    History of pulmonary embolism This was in December 2018.  This was an unprovoked episode.  Eliquis currently on hold.  Patient will benefit from seeing a hematologist in the outpatient setting for both her anemia as well as to determine if continuing anticoagulation is warranted.  Apparently patient prefers to see Dr. Lindi Adie.  Apparently her daughter goes to the same hematologist.  Daughter also has a history of blood  clots.  In view of this it might be prudent to continue with anticoagulation for now.  Gastroenterology has cleared for the patient to resume Eliquis.  Discussed with patient and her daughter.  They were agreeable as well.  Eliquis was resumed yesterday.  She seems to be tolerating it well.  Continued use of anticoagulation will need to be decided based on discussions with the patient's primary care provider as well as hematology consultation.   Acute on chronic diastolic CHF EF is normal based on echo done in December 2018.  Grade 1 diastolic dysfunction was noted.  Patient apparently has gained some weight over the last few months due to noncompliance  with Lasix.  No overt pulmonary edema noted on chest x-ray.  She was given a dose of Lasix on 2/7.  She had good diuresis.  Her weight initially decreased however she appears to have gained over the last 2 days.  She was given an additional dose of Lasix yesterday.  Ins and outs not charted accurately.  Hold off on additional doses for now.  May benefit from daily oral diuretic.  She weighed 113 kg in January.  Weight was similar back in September 2019.    Hypokalemia This was repleted.  Leukocytosis Initially leukocytosis was thought to be due to acute inflammation.  Elevated level today could be due to steroids. ESR 35.   DVT Prophylaxis: SCDs    Code Status: Full code Family Communication: Discussed with the patient Disposition Plan: Management as outlined above.  Waiting on PT and OT evaluation.  Range of motion of both her lower extremities at the hip has improved.  Patient absolutely does not want to go to any skilled nursing facility.  She mentions that she has adequate support at home.  Hopefully discharge home tomorrow with home health.    LOS: 4 days   [place] [employer] on [email or web address]  08/15/2018, 11:39 AM

## 2018-08-16 LAB — CBC
HCT: 30.6 % — ABNORMAL LOW (ref 36.0–46.0)
Hemoglobin: 8.3 g/dL — ABNORMAL LOW (ref 12.0–15.0)
MCH: 22.6 pg — AB (ref 26.0–34.0)
MCHC: 27.1 g/dL — AB (ref 30.0–36.0)
MCV: 83.4 fL (ref 80.0–100.0)
Platelets: 364 10*3/uL (ref 150–400)
RBC: 3.67 MIL/uL — ABNORMAL LOW (ref 3.87–5.11)
RDW: 22.5 % — ABNORMAL HIGH (ref 11.5–15.5)
WBC: 23.8 10*3/uL — ABNORMAL HIGH (ref 4.0–10.5)
nRBC: 0 % (ref 0.0–0.2)

## 2018-08-16 LAB — BASIC METABOLIC PANEL
Anion gap: 9 (ref 5–15)
BUN: 41 mg/dL — AB (ref 8–23)
CO2: 27 mmol/L (ref 22–32)
CREATININE: 1.09 mg/dL — AB (ref 0.44–1.00)
Calcium: 8.8 mg/dL — ABNORMAL LOW (ref 8.9–10.3)
Chloride: 102 mmol/L (ref 98–111)
GFR calc non Af Amer: 48 mL/min — ABNORMAL LOW (ref >60)
GFR, EST AFRICAN AMERICAN: 55 mL/min — AB (ref >60)
Glucose, Bld: 139 mg/dL — ABNORMAL HIGH (ref 70–99)
Potassium: 3.7 mmol/L (ref 3.5–5.1)
SODIUM: 138 mmol/L (ref 135–145)

## 2018-08-16 LAB — GLUCOSE, CAPILLARY: Glucose-Capillary: 126 mg/dL — ABNORMAL HIGH (ref 70–99)

## 2018-08-16 MED ORDER — PREDNISONE 20 MG PO TABS: ORAL_TABLET | ORAL | 0 refills | Status: DC

## 2018-08-16 MED ORDER — PANTOPRAZOLE SODIUM 40 MG PO TBEC: 40.0000 mg | DELAYED_RELEASE_TABLET | Freq: Every day | ORAL | 0 refills | Status: DC

## 2018-08-16 MED ORDER — FERROUS SULFATE 325 (65 FE) MG PO TABS: ORAL_TABLET | ORAL | 1 refills | Status: DC

## 2018-08-16 MED ORDER — POLYETHYLENE GLYCOL 3350 17 G PO PACK: 17.0000 g | PACK | Freq: Every day | ORAL | 0 refills | Status: DC | PRN

## 2018-08-16 MED ORDER — OXYCODONE-ACETAMINOPHEN 5-325 MG PO TABS: 1.0000 | ORAL_TABLET | ORAL | 0 refills | Status: DC | PRN

## 2018-08-16 MED ORDER — SENNA 8.6 MG PO TABS: 1.0000 | ORAL_TABLET | Freq: Every day | ORAL | 0 refills | Status: DC

## 2018-08-16 NOTE — Discharge Instructions (Signed)
Sacroiliac Joint Dysfunction    Sacroiliac joint dysfunction is a condition that causes inflammation on one or both sides of the sacroiliac (SI) joint. The SI joint connects the lower part of the spine (sacrum) with the two upper portions of the pelvis (ilium). This condition causes deep aching or burning pain in the low back. In some cases, the pain may also spread into one or both buttocks, hips, or thighs.  What are the causes?  This condition may be caused by:   Pregnancy. During pregnancy, extra stress is put on the SI joints because the pelvis widens.   Injury, such as:  ? Injuries from car accidents.  ? Sports-related injuries.  ? Work-related injuries.   Having one leg that is shorter than the other.   Conditions that affect the joints, such as:  ? Rheumatoid arthritis.  ? Gout.  ? Psoriatic arthritis.  ? Joint infection (septic arthritis).  Sometimes, the cause of SI joint dysfunction is not known.  What are the signs or symptoms?  Symptoms of this condition include:   Aching or burning pain in the lower back. The pain may also spread to other areas, such as:  ? Buttocks.  ? Groin.  ? Thighs.   Muscle spasms in or around the painful areas.   Increased pain when standing, walking, running, stair climbing, bending, or lifting.  How is this diagnosed?  This condition is diagnosed with a physical exam and medical history. During the exam, the health care provider may move one or both of your legs to different positions to check for pain. Various tests may be done to confirm the diagnosis, including:   Imaging tests to look for other causes of pain. These may include:  ? MRI.  ? CT scan.  ? Bone scan.   Diagnostic injection. A numbing medicine is injected into the SI joint using a needle. If your pain is temporarily improved or stopped after the injection, this can indicate that SI joint dysfunction is the problem.  How is this treated?  Treatment depends on the cause and severity of your condition.  Treatment options may include:   Ice or heat applied to the lower back area after an injury. This may help reduce pain and muscle spasms.   Medicines to relieve pain or inflammation or to relax the muscles.   Wearing a back brace (sacroiliac brace) to help support the joint while your back is healing.   Physical therapy to increase muscle strength around the joint and flexibility at the joint. This may also involve learning proper body positions and ways of moving to relieve stress on the joint.   Direct manipulation of the SI joint.   Injections of steroid medicine into the joint to reduce pain and swelling.   Radiofrequency ablation to burn away nerves that are carrying pain messages from the joint.   Use of a device that provides electrical stimulation to help reduce pain at the joint.   Surgery to put in screws and plates that limit or prevent joint motion. This is rare.  Follow these instructions at home:  Medicines   Take over-the-counter and prescription medicines only as told by your health care provider.   Do not drive or use heavy machinery while taking prescription pain medicine.   If you are taking prescription pain medicine, take actions to prevent or treat constipation. Your health care provider may recommend that you:  ? Drink enough fluid to keep your urine pale yellow.  ?   Eat foods that are high in fiber, such as fresh fruits and vegetables, whole grains, and beans.  ? Limit foods that are high in fat and processed sugars, such as fried or sweet foods.  ? Take an over-the-counter or prescription medicine for constipation.  If you have a brace:   Wear the brace as told by your health care provider. Remove it only as told by your health care provider.   Keep the brace clean.   If the brace is not waterproof:  ? Do not let it get wet.  ? Cover it with a watertight covering when you take a bath or a shower.  Managing pain, stiffness, and swelling          Icing can help with pain and  swelling. Heat may help with muscle tension or spasms. Ask your health care provider if you should use ice or heat.   If directed, put ice on the affected area:  ? If you have a removable brace, remove it as told by your health care provider.  ? Put ice in a plastic bag.  ? Place a towel between your skin and the bag.  ? Leave the ice on for 20 minutes, 2-3 times a day.   If directed, apply heat to the affected area. Use the heat source that your health care provider recommends, such as a moist heat pack or a heating pad.  ? Place a towel between your skin and the heat source.  ? Leave the heat on for 20-30 minutes.  ? Remove the heat if your skin turns bright red. This is especially important if you are unable to feel pain, heat, or cold. You may have a greater risk of getting burned.  General instructions   Rest as needed. Ask your health care provider what activities are safe for you.   Return to your normal activities as told by your health care provider.   Exercise as directed by your health care provider or physical therapist.   Do not use any products that contain nicotine or tobacco, such as cigarettes and e-cigarettes. These can delay bone healing. If you need help quitting, ask your health care provider.   Keep all follow-up visits as told by your health care provider. This is important.  Contact a health care provider if:   Your pain is not controlled with medicine.   You have a fever.   Your pain is getting worse.  Get help right away if:   You have weakness, numbness, or tingling in your legs or feet.   You lose control of your bladder or bowel.  Summary   Sacroiliac joint dysfunction is a condition that causes inflammation on one or both sides of the sacroiliac (SI) joint.   This condition causes deep aching or burning pain in the low back. In some cases, the pain may also spread into one or both buttocks, hips, or thighs.   Treatment depends on the cause and severity of your condition.  It may include medicines to reduce pain and swelling or to relax muscles.  This information is not intended to replace advice given to you by your health care provider. Make sure you discuss any questions you have with your health care provider.  Document Released: 09/18/2008 Document Revised: 08/02/2017 Document Reviewed: 08/02/2017  Elsevier Interactive Patient Education  2019 [employer]

## 2018-08-16 NOTE — Care Management Note (Deleted)
Case Management Note  Patient Details  Name: Chelsea Keller MRN: [id number] Date of Birth: 08-Jan-1935  Subjective/Objective:                  discharged  Action/Plan: Discharge to home with hhc through [facility]-rn, pt, ot and aide  Expected Discharge Date:  08/16/18               Expected Discharge Plan:  [place]  In-House Referral:  NA  Discharge planning Services  CM Consult  Post Acute Care Choice:  Home Health Choice offered to:  Patient  DME Arranged:  N/A DME Agency:  NA  HH Arranged:  RN, PT, OT, Nurse's Aide [place] Agency:  [facility]  Status of Service:  Completed, signed off  If discussed at [place] of Stay Meetings, dates discussed:    Additional Comments:  Leeroy Cha, RN 08/16/2018, 10:22 AM

## 2018-08-16 NOTE — Discharge Summary (Signed)
[facility]  Physician Discharge Summary   Patient ID: Chelsea Keller MRN: [id number] DOB/AGE: May 06, 1935 84 y.o.  Admit date: 08/11/2018 Discharge date: 08/16/2018  PCP: Venia Carbon, MD  DISCHARGE DIAGNOSES:  Symptomatic anemia status post transfusion Chronic GI blood loss Iron deficiency Bilateral sacroiliitis History of gout History of pulmonary embolism on Eliquis Chronic diastolic CHF   RECOMMENDATIONS FOR OUTPATIENT FOLLOW UP: 1. Home health has been ordered.  Patient declines skilled nursing facility 2. Patient to follow-up with her orthopedic providers.  She apparently supposed to get another intra-articular injection to her sacroiliac joint in March. 3. Have sent patient information to Dr. Lindi Adie to see if he would be able to see her in the outpatient setting as discussed below.  If he is unable then PCP to please arrange for referral to another hematologist.     Home Health: Home health PT OT RN aide Equipment/Devices: None  CODE STATUS: Full code  DISCHARGE CONDITION: fair  Diet recommendation: As before   INITIAL HISTORY: 84 y.o.femalewith medical history significant formorbid obesity, heart failure with preserved ejection fraction, DVT/PE on Eliquis, hypertension and history of GI bleed with known gastritis, diverticular disease, colonic polyps and colonic angiodysplasia presented from her PCP office for symptomatic anemia. Her most recent GI bleed was in September 2019, at which time colonoscopy demonstrated diverticular disease internal hemorrhoids and angiodysplastic lesions. She does note a 30 pound weight gain over the last 6 months that is associated with inconsistent use of Lasix.  Hemoglobin was noted to be 5.  Patient was hospitalized for further management.  Consultants: [facility]  Procedures: None yet   HOSPITAL COURSE:   Symptomatic anemia/likely secondary to chronic GI blood loss/iron deficiency Patient had not  noticed any overt GI bleed.  No melanotic stools or bright red blood per rectum. Hemoglobin was 4.9 at presentation.  Patient was transfused a total of 4 units of PRBC.  Hemoglobin responded appropriately.  Patient was also given IV iron.  Ferritin was noted to be 2.  This is thought to be secondary to chronic GI loss.  Hemoglobin has been stable.  No overt bleeding has been noted.  Chronic GI bleed Patient with known history of diverticular disease, internal hemorrhoids and angiodysplastic lesions on colonoscopy done last in September 2019.  This is in the setting of anticoagulation with Eliquis for history of PE.  Gastroenterology was consulted.  No plans for endoscopy at this time.  Gastroenterology has signed off.  Eliquis was resumed.  Left hip and back pain/Bilateral sacroiliitis. Patient with history of sacroiliitis.  She has undergone joint injections previously.  Pelvic x-ray does not show any acute fractures but does show evidence for sacroiliitis.  Patient also complains of left wrist pain.  Patient has a history of gout.  So there could be a component of acute gout as well.  Apparently there is plan for intra-articular injection in March.  She apparently cannot have this done any sooner than that.  Patient was started on systemic steroids with significant improvement.  Continue PPI.  Patient was seen by PT and OT who recommended skilled nursing facility for rehab.  Patient absolutely declines this and wants to go home where she has good support system.  Home health has been ordered.  She will be discharged on tapering doses of steroid.  Outpatient follow-up with orthopedics.  History of pulmonary embolism This was in December 2018.  This was an unprovoked episode.  Eliquis currently on hold.  Patient will benefit from  seeing a hematologist in the outpatient setting for both her anemia as well as to determine if continuing anticoagulation is warranted.  Patient prefers to see Dr. Lindi Adie.   Apparently her daughter goes to the same hematologist.  Daughter also has a history of blood clots.  In view of this it might be prudent to continue with anticoagulation for now.  Gastroenterology has cleared for the patient to resume Eliquis.  Eliquis was resumed.  Message sent to Dr. Lindi Adie to see if he would be willing to see the patient.  If not alternative referral may need to be arranged by patient's primary care provider.   Acute on chronic diastolic CHF EF is normal based on echo done in December 2018.  Grade 1 diastolic dysfunction was noted.  Patient apparently has gained some weight over the last few months due to noncompliance with Lasix.  No overt pulmonary edema noted on chest x-ray.  Patient was given furosemide.  May resume her home medication regimen.  Needs to be compliant.  Hypokalemia This was repleted.  Leukocytosis Leukocytosis due to combination of inflammation and steroids.  Overall stable.  Patient very adamant that she will not want to go to a skilled nursing facility.  Patient is medically stable.  Can be discharged today.  Will order home health for her.    PERTINENT LABS:  The results of significant diagnostics from this hospitalization (including imaging, microbiology, ancillary and laboratory) are listed below for reference.     Labs: Basic Metabolic Panel: Recent Labs  Lab 08/11/18 1419 08/11/18 2100 08/12/18 7893 08/13/18 0551 08/14/18 0626 08/15/18 0605 08/16/18 0621  NA 138  --  137 139 137 140 138  K 3.6  --  3.7 3.2* 3.2* 4.3 3.7  CL 99  --  102 100 99 103 102  CO2 27  --  27 30 28 28 27   GLUCOSE 92  --  96 96 82 153* 139*  BUN 31*  --  29* 28* 27* 35* 41*  CREATININE 1.12  --  1.02* 1.11* 1.15* 1.15* 1.09*  CALCIUM 8.5  --  8.4* 8.4* 8.4* 9.0 8.8*  MG  --  2.1  --   --   --   --   --   PHOS 4.0 3.7  --   --   --   --   --    Liver Function Tests: Recent Labs  Lab 08/11/18 1419  ALBUMIN 3.5   CBC: Recent Labs  Lab  08/12/18 0653 08/12/18 1756 08/13/18 0551 08/14/18 0626 08/15/18 0605 08/16/18 0621  WBC 9.3  --  11.0* 17.1* 20.6* 23.8*  HGB 6.6* 7.6* 8.4* 9.0* 8.8* 8.3*  HCT 23.2*  22.6* 27.2* 29.0* 32.5* 31.9* 30.6*  MCV 76.3*  --  78.4* 80.2 83.5 83.4  PLT 287  --  305 320 307 364   BNP: BNP (last 3 results) Recent Labs    11/06/17 1436 08/11/18 1908  BNP 120.9* 112.4*    CBG: Recent Labs  Lab 08/12/18 0756 08/13/18 0801 08/14/18 0730 08/15/18 0727 08/16/18 0735  GLUCAP 90 98 101* 145* 126*     IMAGING STUDIES Dg Pelvis Portable  Result Date: 08/13/2018 CLINICAL DATA:  Pain EXAM: PORTABLE PELVIS 1-2 VIEWS COMPARISON:  MR pelvis April 03, 2017 FINDINGS: There is no evidence of pelvic fracture or diastasis. There is evidence of bilateral sacroiliitis. There is symmetric narrowing of each hip joint, moderate. IMPRESSION: Changes of sacroiliitis bilaterally. Question underlying seronegative spondyloarthropathy. There Is moderate symmetric narrowing  of each hip joint. No acute fracture or dislocation. Electronically Signed   By: Lowella Grip III M.D.   On: 08/13/2018 10:26   Dg Chest Portable 1 View  Result Date: 08/11/2018 CLINICAL DATA:  Exertional shortness of breath, weakness, and dizziness. Former smoker. History of hypertension and congestive heart failure. EXAM: PORTABLE CHEST 1 VIEW COMPARISON:  03/21/2018 FINDINGS: Shallow inspiration. Cardiac enlargement. No vascular congestion, edema, or consolidation. No blunting of costophrenic angles. No pneumothorax. Mediastinal contours appear intact. Calcification of the aorta. Degenerative changes in the shoulder with diffuse heterogeneous lucent and sclerotic changes in the right humeral head. This could represent degenerative change, bone lesion, or old fracture deformity. No change since prior study. IMPRESSION: Cardiac enlargement. No evidence of active pulmonary disease. Electronically Signed   By: Lucienne Capers M.D.   On:  08/11/2018 19:43    DISCHARGE EXAMINATION: Vitals:   08/15/18 2057 08/16/18 0451 08/16/18 0500 08/16/18 0905  BP: 110/66 123/62  (!) 128/57  Pulse: 67 66  64  Resp: 16 16  18   Temp: 98 F (36.7 C) (!) 97.5 F (36.4 C)  98.3 F (36.8 C)  TempSrc: Oral   Oral  SpO2: 97% 99%  100%  Weight:   118.6 kg   Height:       General appearance: alert, cooperative, appears stated age and no distress Resp: clear to auscultation bilaterally Cardio: regular rate and rhythm, S1, S2 normal, no murmur, click, rub or gallop GI: soft, non-tender; bowel sounds normal; no masses,  no organomegaly Improved range of motion of both hip joints.  DISPOSITION: Home with home health  Discharge Instructions    Call MD for:  difficulty breathing, headache or visual disturbances   Complete by:  As directed    Call MD for:  extreme fatigue   Complete by:  As directed    Call MD for:  persistant dizziness or light-headedness   Complete by:  As directed    Call MD for:  persistant nausea and vomiting   Complete by:  As directed    Call MD for:  severe uncontrolled pain   Complete by:  As directed    Call MD for:  temperature >100.4   Complete by:  As directed    Diet - low sodium heart healthy   Complete by:  As directed    Discharge instructions   Complete by:  As directed    Please take your medications as prescribed.  Please follow-up with your primary care provider to discuss continued use of anticoagulation/Eliquis.  I will send a message to Dr. Lindi Adie to see if he can see you in his office in the next few weeks.  You should also discuss this with your primary care provider.  Also follow-up with your orthopedic doctor to facilitate another intra-articular injection to your SI joint.  Do not take your tramadol while you are taking the oxycodone.  Take stool softeners every day and then laxatives as needed for constipation.  Pain medications can cause constipation.  You were cared for by a hospitalist  during your hospital stay. If you have any questions about your discharge medications or the care you received while you were in the hospital after you are discharged, you can call the unit and asked to speak with the hospitalist on call if the hospitalist that took care of you is not available. Once you are discharged, your primary care physician will handle any further medical issues. Please note that NO REFILLS for  any discharge medications will be authorized once you are discharged, as it is imperative that you return to your primary care physician (or establish a relationship with a primary care physician if you do not have one) for your aftercare needs so that they can reassess your need for medications and monitor your lab values. If you do not have a primary care physician, you can call [phone] for a physician referral.   Increase activity slowly   Complete by:  As directed        Allergies as of 08/16/2018      Reactions   Codeine Sulfate Shortness Of Breath   Celecoxib Other (See Comments)   Caused vaginal bleeding   Erythromycin Base Nausea And Vomiting      Medication List    STOP taking these medications   traMADol 50 MG tablet Commonly known as:  ULTRAM     TAKE these medications   acetaminophen 500 MG tablet Commonly known as:  TYLENOL Take 1,000-1,500 mg by mouth every 6 (six) hours as needed for moderate pain or headache (pain).   ELIQUIS 2.5 MG Tabs tablet Generic drug:  apixaban Take 2.5 mg by mouth 2 (two) times daily.   ferrous sulfate 325 (65 FE) MG tablet Take 1 tablet every other day to begin with and then change to every day if no side effects   furosemide 80 MG tablet Commonly known as:  LASIX Take 80 mg by mouth daily.   gabapentin 300 MG capsule Commonly known as:  NEURONTIN Take 1 capsule (300 mg total) by mouth 3 (three) times daily. What changed:    how much to take  when to take this  additional instructions   oxyCODONE-acetaminophen  5-325 MG tablet Commonly known as:  PERCOCET/ROXICET Take 1-2 tablets by mouth every 4 (four) hours as needed for severe pain.   pantoprazole 40 MG tablet Commonly known as:  PROTONIX Take 1 tablet (40 mg total) by mouth daily at 6 (six) AM for 14 days. Start taking on:  August 17, 2018   polyethylene glycol packet Commonly known as:  MIRALAX / GLYCOLAX Take 17 g by mouth daily as needed for mild constipation.   potassium chloride SA 20 MEQ tablet Commonly known as:  K-DUR,KLOR-CON Take 20 mEq by mouth 2 (two) times daily.   predniSONE 20 MG tablet Commonly known as:  DELTASONE Take 2 tablets TWICE daily for 3 days, then take 2 tablets ONCE daily for 3 days, then take 1 tablet ONCE daily for 3 days, then STOP.   senna 8.6 MG Tabs tablet Commonly known as:  SENOKOT Take 1 tablet (8.6 mg total) by mouth daily.   triamterene-hydrochlorothiazide 37.5-25 MG tablet Commonly known as:  MAXZIDE-25 Take 1 tablet by mouth daily.        Follow-up Information    Venia Carbon, MD. Schedule an appointment as soon as possible for a visit in 1 week(s).   Specialties:  Internal Medicine, Pediatrics Contact information: [address] [place] [address] [phone]           TOTAL DISCHARGE TIME: 40 minutes  [place]  [facility] Pager on [email or web address]  08/16/2018, 10:05 AM

## 2018-08-16 NOTE — Progress Notes (Signed)
OT Cancellation Note  Patient Details Name: Chelsea Keller MRN: [id number] DOB: [birth year]   Cancelled Treatment:     Pt declines OT today.  Feels she needs PT.  Explained that OT would also be beneficial as she needs assist to stand, which is required for adls. Will check back another day, if pt remains in the hospital.  [facility] 08/16/2018, 10:25 AM  Lesle Chris, OTR/L Acute Rehabilitation Services [phone] [facility] pager [phone] office 08/16/2018

## 2018-08-16 NOTE — Care Management Important Message (Signed)
Important Message  Patient Details  Name: Kalecia Hartney MRN: [id number] Date of Birth: 18-Sep-1934   Medicare Important Message Given:  Yes    [name] 08/16/2018, 9:46 AMImportant Message  Patient Details  Name: Lydiah Pong MRN: [id number] Date of Birth: 08/02/1934   Medicare Important Message Given:  Yes    [name] 08/16/2018, 9:46 AM

## 2018-08-16 NOTE — Care Management Note (Signed)
Case Management Note  Patient Details  Name: Chelsea Keller MRN: [id number] Date of Birth: Aug 14, 1934  Subjective/Objective:                  discharge  Action/Plan: hhc through [facility]  Expected Discharge Date:  08/16/18               Expected Discharge Plan:  [place]  In-House Referral:  NA  Discharge planning Services  CM Consult  Post Acute Care Choice:  Home Health Choice offered to:  Patient  DME Arranged:  N/A DME Agency:  NA  HH Arranged:  RN, PT, OT, Nurse's Aide [place] Agency:  [place]  Status of Service:  Completed, signed off  If discussed at [place] of Stay Meetings, dates discussed:    Additional Comments:  Leeroy Cha, RN 08/16/2018, 10:30 AM

## 2018-08-16 NOTE — Progress Notes (Signed)
Physical Therapy Treatment Patient Details Name: Chelsea Keller MRN: [id number] DOB: 11-25-1934 Today's Date: 08/16/2018    History of Present Illness Chelsea Keller is a 84 y.o. female with medical history significant for morbid obesity, heart failure with preserved ejection fraction, DVT/PE on Eliquis, hypertension and history of GI bleed with known gastritis, diverticular disease, colonic polyps and colonic angiodysplasia presenting from her PCP office for symptomatic anemia. Patient has been transfused a total of 4 units of PRBC.    PT Comments    Pt required Max encouragement and was quick to say 'I can't walk".  Pt did agree to get OOB to use BSC.  Pt was able to self rise from elevated bed however had difficulty pivoting and weight shifting onto L LE.  General bed mobility comments: Pt admits to sleeping in her recliner and thus required [facility] assist to get in/out bed.  General transfer comment: pt self able to partially stand and pivot from elevated bed to [facility] with decreased WBing tolerance/time on L LE "Sciatica".  Pt unable to stand upright .  Attempted amb however very limited.  General Gait Details: pt unable to tolerate FWBing L LE and has difficulty standing upright due to pain.  Pt was only able to take a few steps from [facility] back to bed.    Follow Up Recommendations  LPT rec SNF(pt declines SNF)     Equipment Recommendations  None recommended by PT    Recommendations for Other Services       Precautions / Restrictions Precautions Precautions: Fall Restrictions Weight Bearing Restrictions: No    Mobility  Bed Mobility Overal bed mobility: Needs Assistance Bed Mobility: Supine to Sit;Sit to Supine     Supine to sit: Max assist Sit to supine: Max assist;Total assist   General bed mobility comments: Pt admits to sleeping in her recliner and thus required [facility] assist to get in/out bed  Transfers Overall transfer level: Needs assistance Equipment used: Rolling walker (2  wheeled) Transfers: Sit to/from [facility] Sit to Stand: Min guard Stand pivot transfers: Min guard       General transfer comment: pt self able to partially stand and pivot from elevated bed to [facility] with decreased WBing tolerance/time on L LE "Sciatica".  Pt unable to stand upright   Ambulation/Gait Ambulation/Gait assistance: Min guard;Supervision Gait Distance (Feet): 2 Feet Assistive device: Rolling walker (2 wheeled) Gait Pattern/deviations: Step-to pattern;Decreased stride length;Decreased weight shift to left;Decreased weight shift to right;Antalgic;Trunk flexed Gait velocity: decreased    General Gait Details: pt unable to tolerate FWBing L LE and has difficulty standing upright due to pain.  Pt was only able to take a few steps from [facility] back to bed.     Stairs             Wheelchair Mobility    Modified Rankin (Stroke Patients Only)       Balance                                            Cognition Arousal/Alertness: Awake/alert Behavior During Therapy: WFL for tasks assessed/performed Overall Cognitive Status: Within Functional Limits for tasks assessed                                 General Comments: required MAX encouragement and pt appologized  for "being Grumpy"      Exercises      General Comments        Pertinent Vitals/Pain Pain Assessment: 0-10 Pain Score: 8  Pain Location: L hip "sciatica" stated pt Pain Descriptors / Indicators: Aching;Discomfort;Grimacing;Cramping Pain Intervention(s): Monitored during session;Repositioned    Home Living                      Prior Function            PT Goals (current goals can now be found in the care plan section) Progress towards PT goals: Progressing toward goals    Frequency    Min 3X/week      PT Plan Current plan remains appropriate    Co-evaluation                 Help needed turning from your back to your  side while in a flat bed without using bedrails?: A Lot Help needed moving from lying on your back to sitting on the side of a flat bed without using bedrails?: A Lot Help needed moving to and from a bed to a chair (including a wheelchair)?: A Lot Help needed standing up from a chair using your arms (e.g., wheelchair or bedside chair)?: A Lot Help needed to walk in hospital room?: A Lot Help needed climbing 3-5 steps with a railing? : Total 6 Click Score: 11    End of Session Equipment Utilized During Treatment: Gait belt Activity Tolerance: Patient limited by pain;Patient limited by fatigue;Other (comment)(BMI) Patient left: in bed;with call bell/phone within reach Nurse Communication: Mobility status(pt used BSC) PT Visit Diagnosis: Unsteadiness on feet (R26.81);Other abnormalities of gait and mobility (R26.89);Muscle weakness (generalized) (M62.81)     Time: 3007-6226 PT Time Calculation (min) (ACUTE ONLY): 27 min  Charges:  $Gait Training: 8-22 mins $Therapeutic Activity: 8-22 mins                     Rica Koyanagi  PTA Acute  Rehabilitation Services Pager      [phone] Office      [phone]

## 2018-08-17 ENCOUNTER — Telehealth: Payer: Self-pay | Admitting: *Deleted

## 2018-08-17 NOTE — Telephone Encounter (Signed)
Transition Care Management Follow-up Telephone Call   Date discharged? 08/16/18   How have you been since you were released from the hospital? "Doing alright, just got home yesterday"   Do you understand why you were in the hospital? YES   Do you understand the discharge instructions? YES   Where were you discharged to? HOME   Items Reviewed:  Medications reviewed: YES  Allergies reviewed: YES  Dietary changes reviewed: YES  Referrals reviewed :  YES   Functional Questionnaire:   Activities of Daily Living (ADLs):   She states they are independent in the following: grooming, dressing, ambulation, bathing, feeding and going to the bathroom States they require assistance with the following: Nothing   Any transportation issues/concerns?: NO   Any patient concerns? NO   Confirmed importance and date/time of follow-up visits scheduled YES  Provider Appointment booked with Dr. Silvio Pate 08/22/18 at 4:00  Confirmed with patient if condition begins to worsen call PCP or go to the ER.  Patient was given the office number and encouraged to call back with question or concerns.  : YES

## 2018-08-21 DIAGNOSIS — K2951 Unspecified chronic gastritis with bleeding: Secondary | ICD-10-CM | POA: Diagnosis not present

## 2018-08-21 DIAGNOSIS — D500 Iron deficiency anemia secondary to blood loss (chronic): Secondary | ICD-10-CM | POA: Diagnosis not present

## 2018-08-21 DIAGNOSIS — Z86711 Personal history of pulmonary embolism: Secondary | ICD-10-CM | POA: Diagnosis not present

## 2018-08-21 DIAGNOSIS — E669 Obesity, unspecified: Secondary | ICD-10-CM | POA: Diagnosis not present

## 2018-08-21 DIAGNOSIS — Z79899 Other long term (current) drug therapy: Secondary | ICD-10-CM | POA: Diagnosis not present

## 2018-08-21 DIAGNOSIS — Z7952 Long term (current) use of systemic steroids: Secondary | ICD-10-CM | POA: Diagnosis not present

## 2018-08-21 DIAGNOSIS — Z7901 Long term (current) use of anticoagulants: Secondary | ICD-10-CM | POA: Diagnosis not present

## 2018-08-21 DIAGNOSIS — I5033 Acute on chronic diastolic (congestive) heart failure: Secondary | ICD-10-CM | POA: Diagnosis not present

## 2018-08-21 DIAGNOSIS — M461 Sacroiliitis, not elsewhere classified: Secondary | ICD-10-CM | POA: Diagnosis not present

## 2018-08-22 ENCOUNTER — Ambulatory Visit (INDEPENDENT_AMBULATORY_CARE_PROVIDER_SITE_OTHER): Payer: Medicare Other | Admitting: Internal Medicine

## 2018-08-22 ENCOUNTER — Telehealth: Payer: Self-pay | Admitting: Internal Medicine

## 2018-08-22 ENCOUNTER — Encounter: Payer: Self-pay | Admitting: Internal Medicine

## 2018-08-22 ENCOUNTER — Telehealth: Payer: Self-pay | Admitting: *Deleted

## 2018-08-22 VITALS — BP 138/70 | HR 73 | Temp 97.8°F | Ht 62.0 in | Wt 255.5 lb

## 2018-08-22 DIAGNOSIS — K2951 Unspecified chronic gastritis with bleeding: Secondary | ICD-10-CM | POA: Diagnosis not present

## 2018-08-22 DIAGNOSIS — D649 Anemia, unspecified: Secondary | ICD-10-CM | POA: Diagnosis not present

## 2018-08-22 DIAGNOSIS — N183 Chronic kidney disease, stage 3 unspecified: Secondary | ICD-10-CM

## 2018-08-22 DIAGNOSIS — Z7901 Long term (current) use of anticoagulants: Secondary | ICD-10-CM | POA: Diagnosis not present

## 2018-08-22 DIAGNOSIS — M47818 Spondylosis without myelopathy or radiculopathy, sacral and sacrococcygeal region: Secondary | ICD-10-CM | POA: Diagnosis not present

## 2018-08-22 DIAGNOSIS — Z86711 Personal history of pulmonary embolism: Secondary | ICD-10-CM | POA: Diagnosis not present

## 2018-08-22 DIAGNOSIS — M461 Sacroiliitis, not elsewhere classified: Secondary | ICD-10-CM | POA: Diagnosis not present

## 2018-08-22 DIAGNOSIS — I5032 Chronic diastolic (congestive) heart failure: Secondary | ICD-10-CM

## 2018-08-22 DIAGNOSIS — I5033 Acute on chronic diastolic (congestive) heart failure: Secondary | ICD-10-CM | POA: Diagnosis not present

## 2018-08-22 DIAGNOSIS — D500 Iron deficiency anemia secondary to blood loss (chronic): Secondary | ICD-10-CM | POA: Diagnosis not present

## 2018-08-22 NOTE — Assessment & Plan Note (Signed)
From recurrent GI bleeding (mostly angiodysplasia) Will recheck CBC today Set up with Dr Lindi Adie (daughter knows him well) for ongoing follow up/iron infusions, etc

## 2018-08-22 NOTE — Telephone Encounter (Signed)
Verbal orders left for [place] on VM

## 2018-08-22 NOTE — Assessment & Plan Note (Signed)
Will reassess after transfusions and some lasix in hospital

## 2018-08-22 NOTE — Telephone Encounter (Signed)
Okay 

## 2018-08-22 NOTE — Telephone Encounter (Signed)
Verbal orders given to Stacy 

## 2018-08-22 NOTE — Telephone Encounter (Signed)
Stacey David/PT with [facility] called requesting verbal orders for PT home health for 2 week 3 and 1 week 1.

## 2018-08-22 NOTE — Assessment & Plan Note (Signed)
Likely not the biggest reason for the DOE/fatigue Still had gained 30# from her dry weight Urged her to take the furosemide at least every other day

## 2018-08-22 NOTE — Telephone Encounter (Signed)
Mendel Ryder Nurse with [facility] left a voicemail stating that she saw the patient over the weekend for start up care. Ria Comment stated that she needs orders to continue care and would like orders for twice a week for one week, once a week for 2 weeks and once a week fior every other week for 3 weeks. Please call with verbal orders.

## 2018-08-22 NOTE — Assessment & Plan Note (Signed)
Finishing prednisone wean Awaiting another steroid injection

## 2018-08-22 NOTE — Progress Notes (Signed)
Subjective:    Patient ID: Chelsea Keller, female    DOB: 1935/06/14, 84 y.o.   MRN: [id number]  HPI Here with daughter for hospital follow up Followed progress in hospital concurrently and reviewed hospital findings and discharge summary  I had seen her with DOE and fatigue I was more concerned about her CHF but she wanted her blood count checked--- hemoglobin was 5 No GI work up Transfused x 4 and then iron infusion  Did have lasix twice---but not ongoing Breathing is much better  Now having some loose stools---told to stop the senekot  Ongoing pain Will get right S-I joint injection ---but not for 2 weeks Still on prednisone for this as well---discussed monitoring salt and fluid retention due to that  Current Outpatient Medications on File Prior to Visit  Medication Sig Dispense Refill  . acetaminophen (TYLENOL) 500 MG tablet Take 1,000-1,500 mg by mouth every 6 (six) hours as needed for moderate pain or headache (pain).     Marland Kitchen apixaban (ELIQUIS) 2.5 MG TABS tablet Take 2.5 mg by mouth 2 (two) times daily.    . ferrous sulfate 325 (65 FE) MG tablet Take 1 tablet every other day to begin with and then change to every day if no side effects 30 tablet 1  . furosemide (LASIX) 80 MG tablet Take 80 mg by mouth daily.     Marland Kitchen gabapentin (NEURONTIN) 300 MG capsule Take 1 capsule (300 mg total) by mouth 3 (three) times daily. (Patient taking differently: Take 300-600 mg by mouth 2 (two) times daily. Take 1 capsule in the morning and Take 2 capsules at bedtime) 270 capsule 3  . oxyCODONE-acetaminophen (PERCOCET/ROXICET) 5-325 MG tablet Take 1-2 tablets by mouth every 4 (four) hours as needed for severe pain. 30 tablet 0  . pantoprazole (PROTONIX) 40 MG tablet Take 1 tablet (40 mg total) by mouth daily at 6 (six) AM for 14 days. 14 tablet 0  . polyethylene glycol (MIRALAX / GLYCOLAX) packet Take 17 g by mouth daily as needed for mild constipation. 30 each 0  . potassium chloride SA  (K-DUR,KLOR-CON) 20 MEQ tablet Take 20 mEq by mouth 2 (two) times daily.    . predniSONE (DELTASONE) 20 MG tablet Take 2 tablets TWICE daily for 3 days, then take 2 tablets ONCE daily for 3 days, then take 1 tablet ONCE daily for 3 days, then STOP. 21 tablet 0  . senna (SENOKOT) 8.6 MG TABS tablet Take 1 tablet (8.6 mg total) by mouth daily. (Patient taking differently: Take 1 tablet by mouth daily as needed. ) 120 each 0  . triamterene-hydrochlorothiazide (MAXZIDE-25) 37.5-25 MG tablet Take 1 tablet by mouth daily. 90 tablet 3   No current facility-administered medications on file prior to visit.     Allergies  Allergen Reactions  . Codeine Sulfate Shortness Of Breath  . Celecoxib Other (See Comments)    Caused vaginal bleeding  . Erythromycin Base Nausea And Vomiting    Past Medical History:  Diagnosis Date  . Allergy   . Anemia   . Anxiety   . Arthritis   . Arthritis of sacroiliac joint of both sides 11/12/2017  . Bilateral pulmonary embolism ([place]) 06/23/2017  . Chronic diastolic CHF (congestive heart failure) (HCC)    a. Echo 1/16:  mild LVH, EF normal, grade 1 DD, MAC  . Chronic venous insufficiency    chronic LE edema  . DDD (degenerative disc disease), lumbar 11/12/2017  . Degenerative joint disease (DJD) of hip, Bilateral  11/12/2017  . Depression   . Fibromyalgia    constant pain  . Hx of cardiac catheterization    a. LHC in [place] "ok" per patient with mild plaque in a single vessel - records not available  . Hx of cardiovascular stress test    a. Nuclear study in 2008 normal  . Hx of colonic polyps   . Hypertension   . Hypertriglyceridemia   . Impaired fasting glucose   . PONV (postoperative nausea and vomiting)   . PUD (peptic ulcer disease)    hx of gastric ulcer  . Pulmonary emboli ([place]) 06/2017  . Vitamin B12 deficiency     Past Surgical History:  Procedure Laterality Date  . ABDOMINAL HYSTERECTOMY    . CATARACT EXTRACTION  10/2003   OD  . CHOLECYSTECTOMY     . COLONOSCOPY W/ POLYPECTOMY    . COLONOSCOPY WITH PROPOFOL N/A 10/15/2014   Procedure: COLONOSCOPY WITH PROPOFOL;  Surgeon: Ladene Artist, MD;  Location: [facility] ENDOSCOPY;  Service: Endoscopy;  Laterality: N/A;  . COLONOSCOPY WITH PROPOFOL N/A 03/23/2018   Procedure: COLONOSCOPY WITH PROPOFOL;  Surgeon: Gatha Mayer, MD;  Location: [facility] ENDOSCOPY;  Service: Endoscopy;  Laterality: N/A;  . ESOPHAGOGASTRODUODENOSCOPY (EGD) WITH PROPOFOL N/A 10/15/2014   Procedure: ESOPHAGOGASTRODUODENOSCOPY (EGD) WITH PROPOFOL;  Surgeon: Ladene Artist, MD;  Location: [facility] ENDOS[facility];  Service: Endoscopy;  Laterality: N/A;  . EXTERNAL FIXATION ANKLE FRACTURE     Fx.  left ankle-fixation with pins later removed sec to infection 1985  . EXTERNAL FIXATION WRIST FRACTURE  1985   left with pins  . EYE SURGERY    . FEMUR FRACTURE SURGERY  06/2009  . FRACTURE SURGERY    . HARDWARE REMOVAL Left 10/05/2013   Procedure: HARDWARE REMOVAL LEFT DISTAL FEMUR;  Surgeon: Rozanna Box, MD;  Location: [facility];  Service: Orthopedics;  Laterality: Left;  . HEMIARTHROPLASTY SHOULDER FRACTURE  06/2009  . HOT HEMOSTASIS N/A 03/23/2018   Procedure: HOT HEMOSTASIS (ARGON PLASMA COAGULATION/BICAP);  Surgeon: Gatha Mayer, MD;  Location: [facility] ENDOSCOPY;  Service: Endoscopy;  Laterality: N/A;  . JOINT REPLACEMENT    . STERIOD INJECTION Right 10/05/2013   Procedure: STEROID INJECTION;  Surgeon: Rozanna Box, MD;  Location: [place];  Service: Orthopedics;  Laterality: Right;  . TONSILLECTOMY    . TONSILLECTOMY    . TOTAL KNEE ARTHROPLASTY  03/09   left    Family History  Problem Relation Age of Onset  . Depression Mother   . Cancer Mother        uterine cancer  . Heart attack Father   . Cancer Brother        prostate cancer  . Diabetes Maternal Aunt   . Arthritis Brother   . Asthma Brother   . Stroke Maternal Grandmother   . Pulmonary embolism Daughter     Social History   Socioeconomic History  . Marital status: Widowed      Spouse name: Not on file  . Number of children: 4  . Years of education: Not on file  . Highest education level: Not on file  Occupational History  . Occupation: retired crossing guard  . 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 Financial resource strain: Not on file  . Food insecurity:    Worry: Not on file    Inability: Not on file  . Transportation needs:    Medical: Not on file    Non-medical: Not on file  Tobacco  Use  . Smoking status: Former Smoker    Packs/day: 1.00    Years: 40.00    Pack years: 40.00    Types: Cigarettes    Last attempt to quit: 07/06/1993    Years since quitting: 25.1  . Smokeless tobacco: Never Used  Substance and Sexual Activity  . Alcohol use: No    Alcohol/week: 0.0 standard drinks  . Drug use: No  . Sexual activity: Not on file  Lifestyle  . Physical activity:    Days per week: Not on file    Minutes per session: Not on file  . Stress: Not on file  Relationships  . Social connections:    Talks on phone: Not on file    Gets together: Not on file    Attends religious service: Not on file    Active member of club or organization: Not on file    Attends meetings of clubs or organizations: Not on file    Relationship status: Not on file  . Intimate partner violence:    Fear of current or ex partner: Not on file    Emotionally abused: Not on file    Physically abused: Not on file    Forced sexual activity: Not on file  Other Topics Concern  . 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 No distress.  Neck: No thyromegaly present.  Cardiovascular: Normal rate, regular rhythm and normal heart sounds. Exam reveals no gallop.  No murmur heard. Respiratory: Effort normal and breath sounds normal. No respiratory distress. She has no wheezes. She has no rales.  GI: Soft. There is no abdominal tenderness.  Musculoskeletal:     Comments: 2+ reedy calf/foot edema (slightly better than last time)  Lymphadenopathy:    She has no cervical adenopathy.  Skin:  No leg ulcers  Psychiatric: She has a normal mood and affect. Her behavior is normal.           Assessment & Plan:

## 2018-08-23 ENCOUNTER — Telehealth: Payer: Self-pay | Admitting: Hematology and Oncology

## 2018-08-23 LAB — CBC
HCT: 30.8 % — ABNORMAL LOW (ref 36.0–46.0)
Hemoglobin: 9.3 g/dL — ABNORMAL LOW (ref 12.0–15.0)
MCHC: 30.1 g/dL (ref 30.0–36.0)
MCV: 81 fl (ref 78.0–100.0)
Platelets: 315 10*3/uL (ref 150.0–400.0)
RBC: 3.8 Mil/uL — ABNORMAL LOW (ref 3.87–5.11)
RDW: 30 % — ABNORMAL HIGH (ref 11.5–15.5)
WBC: 12.6 10*3/uL — ABNORMAL HIGH (ref 4.0–10.5)

## 2018-08-23 LAB — RENAL FUNCTION PANEL
Albumin: 3.3 g/dL — ABNORMAL LOW (ref 3.5–5.2)
BUN: 24 mg/dL — AB (ref 6–23)
CO2: 29 mEq/L (ref 19–32)
Calcium: 8.5 mg/dL (ref 8.4–10.5)
Chloride: 106 mEq/L (ref 96–112)
Creatinine, Ser: 0.92 mg/dL (ref 0.40–1.20)
GFR: 58.47 mL/min — ABNORMAL LOW (ref >60.00)
Glucose, Bld: 131 mg/dL — ABNORMAL HIGH (ref 70–99)
PHOSPHORUS: 2.6 mg/dL (ref 2.3–4.6)
Potassium: 4.9 mEq/L (ref 3.5–5.1)
Sodium: 141 mEq/L (ref 135–145)

## 2018-08-23 NOTE — Telephone Encounter (Signed)
A new hem appt has been scheduled for the pt to see Dr. Lindi Adie on 3/4 at 345pm. Appt date and time has been given to the pt's daughter. Aware to arrive 30 minutes early.

## 2018-08-24 ENCOUNTER — Ambulatory Visit: Payer: Medicare Other | Admitting: Internal Medicine

## 2018-08-24 ENCOUNTER — Telehealth: Payer: Self-pay

## 2018-08-24 DIAGNOSIS — M461 Sacroiliitis, not elsewhere classified: Secondary | ICD-10-CM | POA: Diagnosis not present

## 2018-08-24 DIAGNOSIS — Z86711 Personal history of pulmonary embolism: Secondary | ICD-10-CM | POA: Diagnosis not present

## 2018-08-24 DIAGNOSIS — I5033 Acute on chronic diastolic (congestive) heart failure: Secondary | ICD-10-CM | POA: Diagnosis not present

## 2018-08-24 DIAGNOSIS — D500 Iron deficiency anemia secondary to blood loss (chronic): Secondary | ICD-10-CM | POA: Diagnosis not present

## 2018-08-24 DIAGNOSIS — Z7901 Long term (current) use of anticoagulants: Secondary | ICD-10-CM | POA: Diagnosis not present

## 2018-08-24 DIAGNOSIS — K2951 Unspecified chronic gastritis with bleeding: Secondary | ICD-10-CM | POA: Diagnosis not present

## 2018-08-24 NOTE — Telephone Encounter (Signed)
noted 

## 2018-08-24 NOTE — Telephone Encounter (Signed)
Esther OT with [facility] said that pt declined an OT evaluation. FYI to Dr Silvio Pate.

## 2018-08-25 ENCOUNTER — Telehealth: Payer: Self-pay

## 2018-08-25 DIAGNOSIS — K2951 Unspecified chronic gastritis with bleeding: Secondary | ICD-10-CM | POA: Diagnosis not present

## 2018-08-25 DIAGNOSIS — Z7901 Long term (current) use of anticoagulants: Secondary | ICD-10-CM | POA: Diagnosis not present

## 2018-08-25 DIAGNOSIS — I5033 Acute on chronic diastolic (congestive) heart failure: Secondary | ICD-10-CM | POA: Diagnosis not present

## 2018-08-25 DIAGNOSIS — Z86711 Personal history of pulmonary embolism: Secondary | ICD-10-CM | POA: Diagnosis not present

## 2018-08-25 DIAGNOSIS — D500 Iron deficiency anemia secondary to blood loss (chronic): Secondary | ICD-10-CM | POA: Diagnosis not present

## 2018-08-25 DIAGNOSIS — M461 Sacroiliitis, not elsewhere classified: Secondary | ICD-10-CM | POA: Diagnosis not present

## 2018-08-25 NOTE — Telephone Encounter (Signed)
Spoke to [place]. Advise her she is supposed to be on 2.5mg  bid. She will advise the pt.

## 2018-08-25 NOTE — Telephone Encounter (Signed)
Mitzi Hansen nurse with [facility] left v/m; sonya saw pt this morning and while reviewing meds; on hospital discharge papers has eliquis 2.5 mg. The bottle pt has and is taking meds from is eliquis 5 mg taking bid since returned home from [place] request cb with how Dr Silvio Pate wants pt to take the Eliquis. Pt last seen 08/22/18

## 2018-08-25 NOTE — Telephone Encounter (Signed)
Should be 2.5mg  bid

## 2018-08-29 ENCOUNTER — Other Ambulatory Visit: Payer: Self-pay | Admitting: Orthopedic Surgery

## 2018-08-29 DIAGNOSIS — M47818 Spondylosis without myelopathy or radiculopathy, sacral and sacrococcygeal region: Secondary | ICD-10-CM

## 2018-08-30 DIAGNOSIS — Z7901 Long term (current) use of anticoagulants: Secondary | ICD-10-CM | POA: Diagnosis not present

## 2018-08-30 DIAGNOSIS — E669 Obesity, unspecified: Secondary | ICD-10-CM | POA: Diagnosis not present

## 2018-08-30 DIAGNOSIS — I5033 Acute on chronic diastolic (congestive) heart failure: Secondary | ICD-10-CM | POA: Diagnosis not present

## 2018-08-30 DIAGNOSIS — M461 Sacroiliitis, not elsewhere classified: Secondary | ICD-10-CM | POA: Diagnosis not present

## 2018-08-30 DIAGNOSIS — Z7952 Long term (current) use of systemic steroids: Secondary | ICD-10-CM | POA: Diagnosis not present

## 2018-08-30 DIAGNOSIS — D500 Iron deficiency anemia secondary to blood loss (chronic): Secondary | ICD-10-CM | POA: Diagnosis not present

## 2018-08-30 DIAGNOSIS — Z79899 Other long term (current) drug therapy: Secondary | ICD-10-CM

## 2018-08-30 DIAGNOSIS — K2951 Unspecified chronic gastritis with bleeding: Secondary | ICD-10-CM | POA: Diagnosis not present

## 2018-08-30 DIAGNOSIS — Z86711 Personal history of pulmonary embolism: Secondary | ICD-10-CM

## 2018-08-31 ENCOUNTER — Other Ambulatory Visit: Payer: Self-pay | Admitting: Internal Medicine

## 2018-09-01 DIAGNOSIS — Z7901 Long term (current) use of anticoagulants: Secondary | ICD-10-CM | POA: Diagnosis not present

## 2018-09-01 DIAGNOSIS — M461 Sacroiliitis, not elsewhere classified: Secondary | ICD-10-CM | POA: Diagnosis not present

## 2018-09-01 DIAGNOSIS — Z86711 Personal history of pulmonary embolism: Secondary | ICD-10-CM | POA: Diagnosis not present

## 2018-09-01 DIAGNOSIS — I5033 Acute on chronic diastolic (congestive) heart failure: Secondary | ICD-10-CM | POA: Diagnosis not present

## 2018-09-01 DIAGNOSIS — K2951 Unspecified chronic gastritis with bleeding: Secondary | ICD-10-CM | POA: Diagnosis not present

## 2018-09-01 DIAGNOSIS — D500 Iron deficiency anemia secondary to blood loss (chronic): Secondary | ICD-10-CM | POA: Diagnosis not present

## 2018-09-02 ENCOUNTER — Ambulatory Visit
Admission: RE | Admit: 2018-09-02 | Discharge: 2018-09-02 | Disposition: A | Discharge status: Home / Self Care | Payer: Medicare Other | Source: Ambulatory Visit | Attending: Orthopedic Surgery | Admitting: Orthopedic Surgery

## 2018-09-02 DIAGNOSIS — M533 Sacrococcygeal disorders, not elsewhere classified: Secondary | ICD-10-CM | POA: Diagnosis not present

## 2018-09-02 DIAGNOSIS — M47818 Spondylosis without myelopathy or radiculopathy, sacral and sacrococcygeal region: Secondary | ICD-10-CM

## 2018-09-02 MED ORDER — IOPAMIDOL (ISOVUE-M 200) INJECTION 41%
1.0000 mL | Freq: Once | INTRAMUSCULAR | Status: AC
  Administered 2018-09-02 (×2): 1 mL via INTRA_ARTICULAR

## 2018-09-02 MED ORDER — METHYLPREDNISOLONE ACETATE 40 MG/ML INJ SUSP (RADIOLOG
120.0000 mg | Freq: Once | INTRAMUSCULAR | Status: AC
  Administered 2018-09-02 (×2): 120 mg via INTRA_ARTICULAR

## 2018-09-02 NOTE — Discharge Instructions (Signed)
Post Procedure Spinal Discharge Instruction Sheet  1. You may resume a regular diet and any medications that you routinely take (including pain medications).  2. No driving day of procedure.  3. Light activity throughout the rest of the day.  Do not do any strenuous work, exercise, bending or lifting.  The day following the procedure, you can resume normal physical activity but you should refrain from exercising or physical therapy for at least three days thereafter.   Common Side Effects:   Headaches- take your usual medications as directed by your physician.  Increase your fluid intake.  Caffeinated beverages may be helpful.  Lie flat in bed until your headache resolves.   Restlessness or inability to sleep- you may have trouble sleeping for the next few days.  Ask your referring physician if you need any medication for sleep.   Facial flushing or redness- should subside within a few days.   Increased pain- a temporary increase in pain a day or two following your procedure is not unusual.  Take your pain medication as prescribed by your referring physician.   Leg cramps  Please contact our office at [phone] for the following symptoms:  Fever greater than 100 degrees.  Headaches unresolved with medication after 2-3 days.  Increased swelling, pain, or redness at injection site.  MAY RESUME ELIQUIS TODAY 09/02/2018

## 2018-09-06 DIAGNOSIS — Z7901 Long term (current) use of anticoagulants: Secondary | ICD-10-CM | POA: Diagnosis not present

## 2018-09-06 DIAGNOSIS — Z86711 Personal history of pulmonary embolism: Secondary | ICD-10-CM | POA: Diagnosis not present

## 2018-09-06 DIAGNOSIS — K2951 Unspecified chronic gastritis with bleeding: Secondary | ICD-10-CM | POA: Diagnosis not present

## 2018-09-06 DIAGNOSIS — I5033 Acute on chronic diastolic (congestive) heart failure: Secondary | ICD-10-CM | POA: Diagnosis not present

## 2018-09-06 DIAGNOSIS — D500 Iron deficiency anemia secondary to blood loss (chronic): Secondary | ICD-10-CM | POA: Diagnosis not present

## 2018-09-06 DIAGNOSIS — M461 Sacroiliitis, not elsewhere classified: Secondary | ICD-10-CM | POA: Diagnosis not present

## 2018-09-06 MED ORDER — OXYCODONE-ACETAMINOPHEN 5-325 MG PO TABS: 1.0000 | ORAL_TABLET | ORAL | 0 refills | Status: DC | PRN

## 2018-09-06 NOTE — Progress Notes (Signed)
[place] CONSULT NOTE  Patient Care Team: Venia Carbon, MD as PCP - General Angelena Form Annita Brod, MD as PCP - Cardiology (Cardiology) Sharmon Revere as Physician Assistant (Physician Assistant)  CHIEF COMPLAINTS/PURPOSE OF CONSULTATION: History of anemia and PE  HISTORY OF PRESENTING ILLNESS:  Chelsea Keller 84 y.o. female is here because of recent diagnosis of anemia following a recent hospitalization on 08/11/18. She has a history of chronic GI blood loss and iron deficiency. She had a PE almost a year ago, her first blood clot, for which she is currently on Eliquis. She has required blood transfusions about every 6 months, but her recent hospitalization was the lowest Hg she had ever had. She presents to the clinic today with her daughter and uses a wheelchair. She notes she is fatigued and has difficulty sleeping, but denies restless legs or dizziness. She denies observable blood in her stools but notes occasional abdominal pain that lasts for a about a minute. She has previously gotten diarrhea from oral iron pills. She is reluctant to stop Eliquis over fears of another blood clot. Her most recent labs from 08/22/18 show: WBC 12.6, platelets 315, Hg 9.3, creatinine 0.92. She uses a walker to ambulate at home in addition to a chairlift on her stairs. She reviewed her medication list with me.   I reviewed her records extensively and collaborated the history with the patient.  MEDICAL HISTORY:  Past Medical History:  Diagnosis Date  . Allergy   . Anemia   . Anxiety   . Arthritis   . Arthritis of sacroiliac joint of both sides 11/12/2017  . Bilateral pulmonary embolism ([place]) 06/23/2017  . Chronic diastolic CHF (congestive heart failure) (HCC)    a. Echo 1/16:  mild LVH, EF normal, grade 1 DD, MAC  . Chronic venous insufficiency    chronic LE edema  . DDD (degenerative disc disease), lumbar 11/12/2017  . Degenerative joint disease (DJD) of hip, Bilateral   11/12/2017  . Depression   . Fibromyalgia    constant pain  . Hx of cardiac catheterization    a. LHC in [place] "ok" per patient with mild plaque in a single vessel - records not available  . Hx of cardiovascular stress test    a. Nuclear study in 2008 normal  . Hx of colonic polyps   . Hypertension   . Hypertriglyceridemia   . Impaired fasting glucose   . PONV (postoperative nausea and vomiting)   . PUD (peptic ulcer disease)    hx of gastric ulcer  . Pulmonary emboli ([place]) 06/2017  . Vitamin B12 deficiency     SURGICAL HISTORY: Past Surgical History:  Procedure Laterality Date  . ABDOMINAL HYSTERECTOMY    . CATARACT EXTRACTION  10/2003   OD  . CHOLECYSTECTOMY    . COLONOSCOPY W/ POLYPECTOMY    . COLONOSCOPY WITH PROPOFOL N/A 10/15/2014   Procedure: COLONOSCOPY WITH PROPOFOL;  Surgeon: Ladene Artist, MD;  Location: [facility];  Service: Endoscopy;  Laterality: N/A;  . COLONOSCOPY WITH PROPOFOL N/A 03/23/2018   Procedure: COLONOSCOPY WITH PROPOFOL;  Surgeon: Gatha Mayer, MD;  Location: [facility] ENDOSCOPY;  Service: Endoscopy;  Laterality: N/A;  . ESOPHAGOGASTRODUODENOSCOPY (EGD) WITH PROPOFOL N/A 10/15/2014   Procedure: ESOPHAGOGASTRODUODENOSCOPY (EGD) WITH PROPOFOL;  Surgeon: Ladene Artist, MD;  Location: [facility] ENDOSCOPY;  Service: Endoscopy;  Laterality: N/A;  . EXTERNAL FIXATION ANKLE FRACTURE     Fx.  left ankle-fixation with pins later removed sec to infection 1985  .  EXTERNAL FIXATION WRIST FRACTURE  1985   left with pins  . EYE SURGERY    . FEMUR FRACTURE SURGERY  06/2009  . FRACTURE SURGERY    . HARDWARE REMOVAL Left 10/05/2013   Procedure: HARDWARE REMOVAL LEFT DISTAL FEMUR;  Surgeon: Rozanna Box, MD;  Location: [place];  Service: Orthopedics;  Laterality: Left;  . HEMIARTHROPLASTY SHOULDER FRACTURE  06/2009  . HOT HEMOSTASIS N/A 03/23/2018   Procedure: HOT HEMOSTASIS (ARGON PLASMA COAGULATION/BICAP);  Surgeon: Gatha Mayer, MD;  Location: [facility] ENDOSCOPY;  Service:  Endoscopy;  Laterality: N/A;  . JOINT REPLACEMENT    . STERIOD INJECTION Right 10/05/2013   Procedure: STEROID INJECTION;  Surgeon: Rozanna Box, MD;  Location: [place];  Service: Orthopedics;  Laterality: Right;  . TONSILLECTOMY    . TONSILLECTOMY    . TOTAL KNEE ARTHROPLASTY  03/09   left    SOCIAL HISTORY: Social History   Socioeconomic History  . Marital status: Widowed    Spouse name: Not on file  . Number of children: 4  . Years of education: Not on file  . Highest education level: Not on file  Occupational History  . Occupation: retired crossing guard  . 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 Financial resource strain: Not on file  . Food insecurity:    Worry: Not on file    Inability: Not on file  . Transportation needs:    Medical: Not on file    Non-medical: Not on file  Tobacco Use  . Smoking status: Former Smoker    Packs/day: 1.00    Years: 40.00    Pack years: 40.00    Types: Cigarettes    Last attempt to quit: 07/06/1993    Years since quitting: 25.1  . Smokeless tobacco: Never Used  Substance and Sexual Activity  . Alcohol use: No    Alcohol/week: 0.0 standard drinks  . Drug use: No  . Sexual activity: Not on file  Lifestyle  . Physical activity:    Days per week: Not on file    Minutes per session: Not on file  . Stress: Not on file  Relationships  . Social connections:    Talks on phone: Not on file    Gets together: Not on file    Attends religious service: Not on file    Active member of club or organization: Not on file    Attends meetings of clubs or organizations: Not on file    Relationship status: Not on file  . Intimate partner violence:    Fear of current or ex partner: Not on file    Emotionally abused: Not on file    Physically abused: Not on file    Forced sexual activity: Not on file  Other Topics Concern  . 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 Depression Mother   . Cancer Mother        uterine cancer  . Heart attack Father   . Cancer Brother  prostate cancer  . Diabetes Maternal Aunt   . Arthritis Brother   . Asthma Brother   . Stroke Maternal Grandmother   . Pulmonary embolism Daughter     ALLERGIES:  is allergic to codeine sulfate; celecoxib; and erythromycin base.  MEDICATIONS:  Current Outpatient Medications  Medication Sig Dispense Refill  . acetaminophen (TYLENOL) 500 MG tablet Take 1,000-1,500 mg by mouth every 6 (six) hours as needed for moderate pain or headache (pain).     Marland Kitchen apixaban (ELIQUIS) 2.5 MG TABS tablet Take 2.5 mg by mouth 2 (two) times daily.    . ferrous sulfate 325 (65 FE) MG tablet Take 1 tablet every other day to begin with and then change to every day if no side effects 30 tablet 1  . furosemide (LASIX) 80 MG tablet Take 80 mg by mouth daily.     Marland Kitchen gabapentin (NEURONTIN) 300 MG capsule Take 1 capsule (300 mg total) by mouth 3 (three) times daily. (Patient taking differently: Take 300-600 mg by mouth 2 (two) times daily. Take 1 capsule in the morning and Take 2 capsules at bedtime) 270 capsule 3  . oxyCODONE-acetaminophen (PERCOCET/ROXICET) 5-325 MG tablet Take 1-2 tablets by mouth every 4 (four) hours as needed for severe pain. 30 tablet 0  . pantoprazole (PROTONIX) 40 MG tablet Take 1 tablet (40 mg total) by mouth daily at 6 (six) AM for 14 days. 14 tablet 0  . polyethylene glycol (MIRALAX / GLYCOLAX) packet Take 17 g by mouth daily as needed for mild constipation. 30 each 0  . potassium chloride SA (K-DUR,KLOR-CON) 20 MEQ tablet TAKE 2 TABLETS (40 MEQ TOTAL) DAILY 180  tablet 4  . predniSONE (DELTASONE) 20 MG tablet Take 2 tablets TWICE daily for 3 days, then take 2 tablets ONCE daily for 3 days, then take 1 tablet ONCE daily for 3 days, then STOP. 21 tablet 0  . senna (SENOKOT) 8.6 MG TABS tablet Take 1 tablet (8.6 mg total) by mouth daily. (Patient taking differently: Take 1 tablet by mouth daily as needed. ) 120 each 0  . triamterene-hydrochlorothiazide (MAXZIDE-25) 37.5-25 MG tablet Take 1 tablet by mouth daily. 90 tablet 3   No current facility-administered medications for this visit.     REVIEW OF SYSTEMS:   Constitutional: Denies fevers, chills or abnormal night sweats (+) fatigue Eyes: Denies blurriness of vision, double vision or watery eyes Ears, nose, mouth, throat, and face: Denies mucositis or sore throat Respiratory: Denies cough, dyspnea or wheezes Cardiovascular: Denies palpitation, chest discomfort or lower extremity swelling Gastrointestinal:  Denies nausea, heartburn or change in bowel habits (+) occasional abd pain Skin: Denies abnormal skin rashes Lymphatics: Denies new lymphadenopathy or easy bruising Neurological:Denies numbness, tingling or new weaknesses Behavioral/Psych: Mood is stable, no new changes (+) difficulty sleeping All other systems were reviewed with the patient and are negative.  PHYSICAL EXAMINATION: ECOG PERFORMANCE STATUS: 1 - Symptomatic but completely ambulatory  Vitals:   09/07/18 1533  BP: (!) 162/70  Pulse: 84  Resp: 17  Temp: 97.8 F (36.6 C)  SpO2: 96%   Filed Weights   09/07/18 1533  Weight: 253 lb 3.2 oz (114.9 kg)    GENERAL:alert, no distress and comfortable SKIN: skin color, texture, turgor are normal, no rashes or significant lesions EYES: normal, conjunctiva are pink and non-injected, sclera clear OROPHARYNX:no exudate, no erythema and lips, buccal mucosa, and tongue normal  NECK: supple, thyroid normal size, non-tender, without nodularity LYMPH:  no palpable lymphadenopathy in the  cervical,  axillary or inguinal LUNGS: clear to auscultation and percussion with normal breathing effort HEART: regular rate & rhythm and no murmurs and no lower extremity edema ABDOMEN:abdomen soft, non-tender and normal bowel sounds Musculoskeletal:no cyanosis of digits and no clubbing  PSYCH: alert & oriented x 3 with fluent speech NEURO: no focal motor/sensory deficits  LABORATORY DATA:  I have reviewed the data as listed Lab Results  Component Value Date   WBC 12.6 (H) 08/22/2018   HGB 9.3 (L) 08/22/2018   HCT 30.8 (L) 08/22/2018   MCV 81.0 08/22/2018   PLT 315.0 08/22/2018   Lab Results  Component Value Date   NA 141 08/22/2018   K 4.9 08/22/2018   CL 106 08/22/2018   CO2 29 08/22/2018    RADIOGRAPHIC STUDIES: I have personally reviewed the radiological reports and agreed with the findings in the report.  ASSESSMENT AND PLAN:  Iron deficiency anemia due to chronic blood loss Hospitalization 08/11/2018: Severe GI bleed hemoglobin 4.9 (internal hemorrhoids and angiodysplasia of the colon) Given blood transfusions and discharged home 08/14/2018 with hemoglobin of 9 08/22/2018 hemoglobin 9.3 (patient's baseline is 11 to 12 g on 04/20/2018) Ferritin while in hospital: 2 (10 months ago it was 6 and 4 years ago it was 8)  Iron deficiency anemia: I counseled extensively regarding the different causes of iron deficiency including blood loss and malabsorption. I recommended IV iron. I strongly suspect the cause of iron deficiency anemia is bleeding as a result of anticoagulation therapy. Patient had a PE over a year ago and has been on anticoagulation.  I recommended obtaining a CT of the chest and if there is no evidence of PE, to discontinue anticoagulation.  The risks and benefits did not support continuation of anticoagulation since this is the first blood clot.  Recommendation: 1.  proceed with 2 doses of IV iron infusion with Feraheme 2.  Check CBC and iron studies today 3.   Recheck blood counts every month 4.  Return to clinic in 1 month with labs and follow-up  I discussed with the patient that potentially there may be a need for additional IV iron infusions if the iron levels were to remain low in the future. The frequency of need of IV iron would depend on many other factors including the rate of loss   Return to clinic in 1 months with recheck on iron studies and hemoglobin.  All questions were answered. The patient knows to call the clinic with any problems, questions or concerns.   Nicholas Lose, MD 09/07/2018   I, Molly Dorshimer, am acting as scribe for Nicholas Lose, MD.  I have reviewed the above documentation for accuracy and completeness, and I agree with the above.

## 2018-09-06 NOTE — Telephone Encounter (Signed)
Last office visit 08/22/2018 for hospital follow up.  Last refilled 08/16/2018 for #30 with no refills. Next appt: 09/12/2018 for 1 month follow up.

## 2018-09-07 ENCOUNTER — Inpatient Hospital Stay: Payer: Medicare Other | Attending: Hematology and Oncology | Admitting: Hematology and Oncology

## 2018-09-07 ENCOUNTER — Inpatient Hospital Stay: Payer: Medicare Other

## 2018-09-07 ENCOUNTER — Telehealth: Payer: Self-pay

## 2018-09-07 VITALS — BP 162/70 | HR 84 | Temp 97.8°F | Resp 17 | Ht 62.0 in | Wt 253.2 lb

## 2018-09-07 DIAGNOSIS — D500 Iron deficiency anemia secondary to blood loss (chronic): Secondary | ICD-10-CM

## 2018-09-07 DIAGNOSIS — Z7901 Long term (current) use of anticoagulants: Secondary | ICD-10-CM

## 2018-09-07 DIAGNOSIS — I110 Hypertensive heart disease with heart failure: Secondary | ICD-10-CM | POA: Diagnosis not present

## 2018-09-07 DIAGNOSIS — Z86711 Personal history of pulmonary embolism: Secondary | ICD-10-CM | POA: Diagnosis not present

## 2018-09-07 DIAGNOSIS — Z87891 Personal history of nicotine dependence: Secondary | ICD-10-CM | POA: Diagnosis not present

## 2018-09-07 DIAGNOSIS — Z79899 Other long term (current) drug therapy: Secondary | ICD-10-CM | POA: Diagnosis not present

## 2018-09-07 DIAGNOSIS — R109 Unspecified abdominal pain: Secondary | ICD-10-CM | POA: Diagnosis not present

## 2018-09-07 DIAGNOSIS — R5383 Other fatigue: Secondary | ICD-10-CM

## 2018-09-07 DIAGNOSIS — K5520 Angiodysplasia of colon without hemorrhage: Secondary | ICD-10-CM

## 2018-09-07 LAB — CBC WITH DIFFERENTIAL (CANCER CENTER ONLY)
Abs Immature Granulocytes: 0.11 10*3/uL — ABNORMAL HIGH (ref 0.00–0.07)
Basophils Absolute: 0.1 10*3/uL (ref 0.0–0.1)
Basophils Relative: 0 %
EOS ABS: 0.2 10*3/uL (ref 0.0–0.5)
Eosinophils Relative: 2 %
HEMATOCRIT: 31.1 % — AB (ref 36.0–46.0)
Hemoglobin: 9 g/dL — ABNORMAL LOW (ref 12.0–15.0)
Immature Granulocytes: 1 %
Lymphocytes Relative: 10 %
Lymphs Abs: 1.3 10*3/uL (ref 0.7–4.0)
MCH: 25.9 pg — ABNORMAL LOW (ref 26.0–34.0)
MCHC: 28.9 g/dL — ABNORMAL LOW (ref 30.0–36.0)
MCV: 89.6 fL (ref 80.0–100.0)
Monocytes Absolute: 0.9 10*3/uL (ref 0.1–1.0)
Monocytes Relative: 7 %
NEUTROS PCT: 80 %
Neutro Abs: 10 10*3/uL — ABNORMAL HIGH (ref 1.7–7.7)
Platelet Count: 387 10*3/uL (ref 150–400)
RBC: 3.47 MIL/uL — ABNORMAL LOW (ref 3.87–5.11)
RDW: 24.7 % — ABNORMAL HIGH (ref 11.5–15.5)
WBC Count: 12.6 10*3/uL — ABNORMAL HIGH (ref 4.0–10.5)
nRBC: 0 % (ref 0.0–0.2)

## 2018-09-07 LAB — COMPREHENSIVE METABOLIC PANEL
ALT: 17 U/L (ref 0–44)
AST: 18 U/L (ref 15–41)
Albumin: 3.6 g/dL (ref 3.5–5.0)
Alkaline Phosphatase: 91 U/L (ref 38–126)
Anion gap: 7 (ref 5–15)
BUN: 23 mg/dL (ref 8–23)
CO2: 30 mmol/L (ref 22–32)
Calcium: 9.3 mg/dL (ref 8.9–10.3)
Chloride: 105 mmol/L (ref 98–111)
Creatinine, Ser: 0.95 mg/dL (ref 0.44–1.00)
GFR calc Af Amer: >60 mL/min (ref >60)
GFR calc non Af Amer: 56 mL/min — ABNORMAL LOW (ref >60)
GLUCOSE: 91 mg/dL (ref 70–99)
Potassium: 3.5 mmol/L (ref 3.5–5.1)
Sodium: 142 mmol/L (ref 135–145)
Total Bilirubin: 0.7 mg/dL (ref 0.3–1.2)
Total Protein: 6 g/dL — ABNORMAL LOW (ref 6.5–8.1)

## 2018-09-07 NOTE — Telephone Encounter (Signed)
Called pt to notify her of hg 9.0 today. Pt thankful for the call. Told pt that she will be contacted with her iron/fe levels tomorrow.

## 2018-09-07 NOTE — Assessment & Plan Note (Signed)
Hospitalization 08/11/2018: Severe GI bleed hemoglobin 4.9 (internal hemorrhoids and angiodysplasia of the colon) Given blood transfusions and discharged home 08/14/2018 with hemoglobin of 9 08/22/2018 hemoglobin 9.3 (patient's baseline is 11 to 12 g on 04/20/2018) Ferritin while in hospital: 2 (10 months ago it was 6 and 4 years ago it was 8)  Iron deficiency anemia: I counseled extensively regarding the different causes of iron deficiency including blood loss and malabsorption. I recommended IV iron.  Recommendation:  proceed with 2 doses of IV iron infusion with Feraheme  I discussed with the patient that potentially there may be a need for additional IV iron infusions if the iron levels were to remain low in the future. The frequency of need of IV iron would depend on many other factors including the rate of loss   Return to clinic in 3 months with recheck on iron studies and hemoglobin.

## 2018-09-08 DIAGNOSIS — Z86711 Personal history of pulmonary embolism: Secondary | ICD-10-CM | POA: Diagnosis not present

## 2018-09-08 DIAGNOSIS — I5033 Acute on chronic diastolic (congestive) heart failure: Secondary | ICD-10-CM | POA: Diagnosis not present

## 2018-09-08 DIAGNOSIS — Z7901 Long term (current) use of anticoagulants: Secondary | ICD-10-CM | POA: Diagnosis not present

## 2018-09-08 DIAGNOSIS — M461 Sacroiliitis, not elsewhere classified: Secondary | ICD-10-CM | POA: Diagnosis not present

## 2018-09-08 DIAGNOSIS — D500 Iron deficiency anemia secondary to blood loss (chronic): Secondary | ICD-10-CM | POA: Diagnosis not present

## 2018-09-08 DIAGNOSIS — K2951 Unspecified chronic gastritis with bleeding: Secondary | ICD-10-CM | POA: Diagnosis not present

## 2018-09-08 LAB — IRON AND TIBC
Iron: 33 ug/dL (ref 28–170)
Saturation Ratios: 9 % — ABNORMAL LOW (ref 10.4–31.8)
TIBC: 350 ug/dL (ref 250–450)
UIBC: 317 ug/dL

## 2018-09-08 LAB — FERRITIN: Ferritin: 22 ng/mL (ref 11–307)

## 2018-09-09 ENCOUNTER — Ambulatory Visit (HOSPITAL_COMMUNITY)
Admission: RE | Admit: 2018-09-09 | Discharge: 2018-09-09 | Disposition: A | Discharge status: Home / Self Care | Payer: Medicare Other | Source: Ambulatory Visit | Attending: Hematology and Oncology | Admitting: Hematology and Oncology

## 2018-09-09 DIAGNOSIS — D500 Iron deficiency anemia secondary to blood loss (chronic): Secondary | ICD-10-CM | POA: Diagnosis not present

## 2018-09-09 MED ORDER — SODIUM CHLORIDE 0.9 % IV SOLN
Freq: Once | INTRAVENOUS | Status: AC
  Administered 2018-09-09: 12:00:00 via INTRAVENOUS

## 2018-09-09 MED ORDER — SODIUM CHLORIDE 0.9 % IV SOLN
510.0000 mg | Freq: Once | INTRAVENOUS | Status: AC
  Administered 2018-09-09: 510 mg via INTRAVENOUS
  Filled 2018-09-09: qty 17

## 2018-09-09 NOTE — Discharge Instructions (Signed)

## 2018-09-09 NOTE — Progress Notes (Signed)
PATIENT CARE CENTER NOTE  Diagnosis: Iron Deficiency Anemia due to chronic blood loss   Provider: Nicholas Lose, MD   Procedure: IV Feraheme    Note: Patient received Feraheme infusion. Tolerated well with no adverse reaction. Observed patient for 30 minutes post-infusion. Vital signs stable. Discharge instructions given. Patient alert, oriented and transferred in wheelchair at discharge.

## 2018-09-12 ENCOUNTER — Ambulatory Visit (INDEPENDENT_AMBULATORY_CARE_PROVIDER_SITE_OTHER): Payer: Medicare Other | Admitting: Internal Medicine

## 2018-09-12 ENCOUNTER — Encounter: Payer: Self-pay | Admitting: Internal Medicine

## 2018-09-12 VITALS — BP 110/82 | HR 70 | Temp 98.0°F | Ht 62.0 in | Wt 254.0 lb

## 2018-09-12 DIAGNOSIS — I5032 Chronic diastolic (congestive) heart failure: Secondary | ICD-10-CM | POA: Diagnosis not present

## 2018-09-12 DIAGNOSIS — D649 Anemia, unspecified: Secondary | ICD-10-CM | POA: Diagnosis not present

## 2018-09-12 DIAGNOSIS — I700 Atherosclerosis of aorta: Secondary | ICD-10-CM

## 2018-09-12 NOTE — Assessment & Plan Note (Signed)
On imaging She prefers no statin Will add aspirin 81 every other day--if the eliquis is stopped

## 2018-09-12 NOTE — Assessment & Plan Note (Signed)
Not getting iron infusions

## 2018-09-12 NOTE — Progress Notes (Signed)
Subjective:    Patient ID: Chelsea Keller, female    DOB: 12/03/1934, 84 y.o.   MRN: [id number]  HPI Here with daughter for follow up of anemia and CHF  Doing some better Did get shot S-I injection --but it "didn't take" Pain is not really better---though somewhat less intense Is planning to ask for another  Got another iron infusion with Dr Lindi Adie and will get another on Friday Is getting another chest CT---if no emboli seen, plans to stop the eliqus  Weight is stanble Edema "comes and goes" Good day today Breathing is good Takes the lasix "some of the time"---at most twice a week  Reviewed imaging Has aortic atherosclerosis (and coronary) Not excited about statin  Current Outpatient Medications on File Prior to Visit  Medication Sig Dispense Refill  . acetaminophen (TYLENOL) 500 MG tablet Take 1,000-1,500 mg by mouth every 6 (six) hours as needed for moderate pain or headache (pain).     Marland Kitchen apixaban (ELIQUIS) 2.5 MG TABS tablet Take 2.5 mg by mouth 2 (two) times daily.    . ferrous sulfate 325 (65 FE) MG tablet Take 1 tablet every other day to begin with and then change to every day if no side effects 30 tablet 1  . furosemide (LASIX) 80 MG tablet Take 80 mg by mouth daily.     Marland Kitchen gabapentin (NEURONTIN) 300 MG capsule Take 1 capsule (300 mg total) by mouth 3 (three) times daily. (Patient taking differently: Take 300-600 mg by mouth 2 (two) times daily. Take 1 capsule in the morning and Take 2 capsules at bedtime) 270 capsule 3  . oxyCODONE-acetaminophen (PERCOCET/ROXICET) 5-325 MG tablet Take 1-2 tablets by mouth every 4 (four) hours as needed for severe pain. 30 tablet 0  . polyethylene glycol (MIRALAX / GLYCOLAX) packet Take 17 g by mouth daily as needed for mild constipation. 30 each 0  . potassium chloride SA (K-DUR,KLOR-CON) 20 MEQ tablet TAKE 2 TABLETS (40 MEQ TOTAL) DAILY 180 tablet 4  . triamterene-hydrochlorothiazide (MAXZIDE-25) 37.5-25 MG tablet Take 1 tablet by mouth  daily. 90 tablet 3   No current facility-administered medications on file prior to visit.     Allergies  Allergen Reactions  . Codeine Sulfate Shortness Of Breath  . Celecoxib Other (See Comments)    Caused vaginal bleeding  . Erythromycin Base Nausea And Vomiting    Past Medical History:  Diagnosis Date  . Allergy   . Anemia   . Anxiety   . Arthritis   . Arthritis of sacroiliac joint of both sides 11/12/2017  . Bilateral pulmonary embolism ([facility]) 06/23/2017  . Chronic diastolic CHF (congestive heart failure) (HCC)    a. Echo 1/16:  mild LVH, EF normal, grade 1 DD, MAC  . Chronic venous insufficiency    chronic LE edema  . DDD (degenerative disc disease), lumbar 11/12/2017  . Degenerative joint disease (DJD) of hip, Bilateral  11/12/2017  . Depression   . Fibromyalgia    constant pain  . Hx of cardiac catheterization    a. LHC in [place] "ok" per patient with mild plaque in a single vessel - records not available  . Hx of cardiovascular stress test    a. Nuclear study in 2008 normal  . Hx of colonic polyps   . Hypertension   . Hypertriglyceridemia   . Impaired fasting glucose   . PONV (postoperative nausea and vomiting)   . PUD (peptic ulcer disease)    hx of gastric ulcer  . Pulmonary  emboli ([facility]) 06/2017  . Vitamin B12 deficiency     Past Surgical History:  Procedure Laterality Date  . ABDOMINAL HYSTERECTOMY    . CATARACT EXTRACTION  10/2003   OD  . CHOLECYSTECTOMY    . COLONOSCOPY W/ POLYPECTOMY    . COLONOSCOPY WITH PROPOFOL N/A 10/15/2014   Procedure: COLONOSCOPY WITH PROPOFOL;  Surgeon: Ladene Artist, MD;  Location: [facility] ENDOSCOPY;  Service: Endoscopy;  Laterality: N/A;  . COLONOSCOPY WITH PROPOFOL N/A 03/23/2018   Procedure: COLONOSCOPY WITH PROPOFOL;  Surgeon: Gatha Mayer, MD;  Location: [facility] ENDOSCOPY;  Service: Endoscopy;  Laterality: N/A;  . ESOPHAGOGASTRODUODENOSCOPY (EGD) WITH PROPOFOL N/A 10/15/2014   Procedure: ESOPHAGOGASTRODUODENOSCOPY (EGD) WITH  PROPOFOL;  Surgeon: Ladene Artist, MD;  Location: [facility] ENDOSCOPY;  Service: Endoscopy;  Laterality: N/A;  . EXTERNAL FIXATION ANKLE FRACTURE     Fx.  left ankle-fixation with pins later removed sec to infection 1985  . EXTERNAL FIXATION WRIST FRACTURE  1985   left with pins  . EYE SURGERY    . FEMUR FRACTURE SURGERY  06/2009  . FRACTURE SURGERY    . HARDWARE REMOVAL Left 10/05/2013   Procedure: HARDWARE REMOVAL LEFT DISTAL FEMUR;  Surgeon: Rozanna Box, MD;  Location: [facility];  Service: Orthopedics;  Laterality: Left;  . HEMIARTHROPLASTY SHOULDER FRACTURE  06/2009  . HOT HEMOSTASIS N/A 03/23/2018   Procedure: HOT HEMOSTASIS (ARGON PLASMA COAGULATION/BICAP);  Surgeon: Gatha Mayer, MD;  Location[facility] END[facility]COPY;  Service: Endoscopy;  Laterality: N/A;  . JOINT REPLACEMENT    . STERIOD INJECTION Right 10/05/2013   Procedure: STEROID INJECTION;  Surgeon: Rozanna Box, MD;  Location: [facility];  Service: Orthopedics;  Laterality: Right;  . TONSILLECTOMY    . TONSILLECTOMY    . TOTAL KNEE ARTHROPLASTY  03/09   left    Family History  Problem Relation Age of Onset  . Depression Mother   . Cancer Mother        uterine cancer  . Heart attack Father   . Cancer Brother        prostate cancer  . Diabetes Maternal Aunt   . Arthritis Brother   . Asthma Brother   . Stroke Maternal Grandmother   . Pulmonary embolism Daughter     Social History   Socioeconomic History  . Marital status: Widowed    Spouse name: Not on file  . Number of children: 4  . Years of education: Not on file  . Highest education level: Not on file  Occupational History  . Occupation: retired crossing guard  . 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 Financial resource strain: Not on file  . Food insecurity:    Worry: Not on file    Inability: Not on file  . Transportation needs:    Medical: Not on file    Non-medical: Not on file  Tobacco Use  . Smoking status: Former Smoker     Packs/day: 1.00    Years: 40.00    Pack years: 40.00    Types: Cigarettes    Last attempt to quit: 07/06/1993    Years since quitting: 25.2  . Smokeless tobacco: Never Used  Substance and Sexual Activity  . Alcohol use: No    Alcohol/week: 0.0 standard drinks  . Drug use: No  . Sexual activity: Not on file  Lifestyle  . Physical activity:    Days per week: Not on file    Minutes per session: Not on file  .  Stress: Not on file  Relationships  . Social connections:    Talks on phone: Not on file    Gets together: Not on file    Attends religious service: Not on file    Active member of club or organization: Not on file    Attends meetings of clubs or organizations: Not on file    Relationship status: Not on file  . Intimate partner violence:    Fear of current or ex partner: Not on file    Emotionally abused: Not on file    Physically abused: Not on file    Forced sexual activity: Not on file  Other Topics Concern  . 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 No distress.  Neck: No thyromegaly present.  Cardiovascular: Normal rate, regular rhythm and normal heart sounds. Exam reveals no gallop.  No murmur heard. Respiratory: Effort normal and breath sounds normal. No respiratory distress. She has no wheezes. She has no rales.  Musculoskeletal:     Comments: 1+ non pitting calf edema  Lymphadenopathy:    She has no cervical adenopathy.           Assessment & Plan:

## 2018-09-12 NOTE — Assessment & Plan Note (Signed)
Compensated She is not taking furosemide regularly --urged her to take it as much as she can tolerate

## 2018-09-13 DIAGNOSIS — Z7901 Long term (current) use of anticoagulants: Secondary | ICD-10-CM | POA: Diagnosis not present

## 2018-09-13 DIAGNOSIS — M461 Sacroiliitis, not elsewhere classified: Secondary | ICD-10-CM | POA: Diagnosis not present

## 2018-09-13 DIAGNOSIS — D500 Iron deficiency anemia secondary to blood loss (chronic): Secondary | ICD-10-CM | POA: Diagnosis not present

## 2018-09-13 DIAGNOSIS — Z86711 Personal history of pulmonary embolism: Secondary | ICD-10-CM | POA: Diagnosis not present

## 2018-09-13 DIAGNOSIS — K2951 Unspecified chronic gastritis with bleeding: Secondary | ICD-10-CM | POA: Diagnosis not present

## 2018-09-13 DIAGNOSIS — I5033 Acute on chronic diastolic (congestive) heart failure: Secondary | ICD-10-CM | POA: Diagnosis not present

## 2018-09-14 ENCOUNTER — Telehealth: Payer: Self-pay | Admitting: Hematology and Oncology

## 2018-09-14 ENCOUNTER — Ambulatory Visit (HOSPITAL_COMMUNITY)
Admission: RE | Admit: 2018-09-14 | Discharge: 2018-09-14 | Disposition: A | Discharge status: Home / Self Care | Payer: Medicare Other | Source: Ambulatory Visit | Attending: Hematology and Oncology | Admitting: Hematology and Oncology

## 2018-09-14 ENCOUNTER — Other Ambulatory Visit: Payer: Self-pay

## 2018-09-14 DIAGNOSIS — Z86711 Personal history of pulmonary embolism: Secondary | ICD-10-CM | POA: Insufficient documentation

## 2018-09-14 DIAGNOSIS — I2699 Other pulmonary embolism without acute cor pulmonale: Secondary | ICD-10-CM | POA: Diagnosis not present

## 2018-09-14 MED ORDER — SODIUM CHLORIDE (PF) 0.9 % IJ SOLN
INTRAMUSCULAR | Status: AC
  Filled 2018-09-14: qty 50

## 2018-09-14 MED ORDER — IOHEXOL 350 MG/ML SOLN
100.0000 mL | Freq: Once | INTRAVENOUS | Status: AC | PRN
  Administered 2018-09-14: 100 mL via INTRAVENOUS

## 2018-09-14 NOTE — Telephone Encounter (Signed)
I tried to leave a message but her telephone does not accept messages. The CT chest does not show any evidence of PE.

## 2018-09-15 ENCOUNTER — Other Ambulatory Visit: Payer: Self-pay | Admitting: *Deleted

## 2018-09-15 ENCOUNTER — Telehealth: Payer: Self-pay | Admitting: *Deleted

## 2018-09-15 NOTE — Telephone Encounter (Signed)
Disused with Dr. Lindi Adie regarding pt having hot flashes and palpitations post first infusion of feraheme.  Per Dr. Lindi Adie, add Tylenol 650 p.o and benadryl 25 mg p.o as pre meds prior to second dose of feraheme.  Pt notified and verbal understanding.  Instructed pt to call us post second infusion if she continues to have signs and symptoms.

## 2018-09-16 ENCOUNTER — Telehealth: Payer: Self-pay | Admitting: *Deleted

## 2018-09-16 ENCOUNTER — Ambulatory Visit: Payer: Medicare Other

## 2018-09-16 ENCOUNTER — Inpatient Hospital Stay: Payer: Medicare Other

## 2018-09-16 NOTE — Telephone Encounter (Signed)
Received pt call stating that she has had diarrhea since her last IV iron infusion.  Pt stating she would like to re schedule her iron infusion apt from today to next week (09/23/2018).  Message sent to scheduling to re schedule apt and to call pt to confirm date and time.

## 2018-09-20 DIAGNOSIS — Z7901 Long term (current) use of anticoagulants: Secondary | ICD-10-CM | POA: Diagnosis not present

## 2018-09-20 DIAGNOSIS — E669 Obesity, unspecified: Secondary | ICD-10-CM | POA: Diagnosis not present

## 2018-09-20 DIAGNOSIS — Z7952 Long term (current) use of systemic steroids: Secondary | ICD-10-CM | POA: Diagnosis not present

## 2018-09-20 DIAGNOSIS — M461 Sacroiliitis, not elsewhere classified: Secondary | ICD-10-CM | POA: Diagnosis not present

## 2018-09-20 DIAGNOSIS — Z86711 Personal history of pulmonary embolism: Secondary | ICD-10-CM | POA: Diagnosis not present

## 2018-09-20 DIAGNOSIS — I5033 Acute on chronic diastolic (congestive) heart failure: Secondary | ICD-10-CM | POA: Diagnosis not present

## 2018-09-20 DIAGNOSIS — Z79899 Other long term (current) drug therapy: Secondary | ICD-10-CM | POA: Diagnosis not present

## 2018-09-20 DIAGNOSIS — D500 Iron deficiency anemia secondary to blood loss (chronic): Secondary | ICD-10-CM | POA: Diagnosis not present

## 2018-09-20 DIAGNOSIS — K2951 Unspecified chronic gastritis with bleeding: Secondary | ICD-10-CM | POA: Diagnosis not present

## 2018-09-22 DIAGNOSIS — K2951 Unspecified chronic gastritis with bleeding: Secondary | ICD-10-CM | POA: Diagnosis not present

## 2018-09-22 DIAGNOSIS — M461 Sacroiliitis, not elsewhere classified: Secondary | ICD-10-CM | POA: Diagnosis not present

## 2018-09-22 DIAGNOSIS — Z86711 Personal history of pulmonary embolism: Secondary | ICD-10-CM | POA: Diagnosis not present

## 2018-09-22 DIAGNOSIS — I5033 Acute on chronic diastolic (congestive) heart failure: Secondary | ICD-10-CM | POA: Diagnosis not present

## 2018-09-22 DIAGNOSIS — Z7901 Long term (current) use of anticoagulants: Secondary | ICD-10-CM | POA: Diagnosis not present

## 2018-09-22 DIAGNOSIS — D500 Iron deficiency anemia secondary to blood loss (chronic): Secondary | ICD-10-CM | POA: Diagnosis not present

## 2018-09-23 ENCOUNTER — Other Ambulatory Visit: Payer: Self-pay

## 2018-09-23 ENCOUNTER — Inpatient Hospital Stay: Payer: Medicare Other

## 2018-09-23 VITALS — BP 132/68 | HR 79 | Temp 98.2°F | Resp 18

## 2018-09-23 DIAGNOSIS — D500 Iron deficiency anemia secondary to blood loss (chronic): Secondary | ICD-10-CM

## 2018-09-23 DIAGNOSIS — Z79899 Other long term (current) drug therapy: Secondary | ICD-10-CM | POA: Diagnosis not present

## 2018-09-23 DIAGNOSIS — R5383 Other fatigue: Secondary | ICD-10-CM | POA: Diagnosis not present

## 2018-09-23 DIAGNOSIS — I110 Hypertensive heart disease with heart failure: Secondary | ICD-10-CM | POA: Diagnosis not present

## 2018-09-23 DIAGNOSIS — Z7901 Long term (current) use of anticoagulants: Secondary | ICD-10-CM | POA: Diagnosis not present

## 2018-09-23 DIAGNOSIS — R109 Unspecified abdominal pain: Secondary | ICD-10-CM | POA: Diagnosis not present

## 2018-09-23 MED ORDER — ACETAMINOPHEN 325 MG PO TABS
650.0000 mg | ORAL_TABLET | Freq: Four times a day (QID) | ORAL | Status: DC | PRN
  Administered 2018-09-23: 650 mg via ORAL

## 2018-09-23 MED ORDER — SODIUM CHLORIDE 0.9 % IV SOLN
Freq: Once | INTRAVENOUS | Status: AC
  Administered 2018-09-23: 15:00:00 via INTRAVENOUS
  Filled 2018-09-23: qty 250

## 2018-09-23 MED ORDER — DIPHENHYDRAMINE HCL 25 MG PO CAPS
25.0000 mg | ORAL_CAPSULE | Freq: Four times a day (QID) | ORAL | Status: DC | PRN
  Administered 2018-09-23: 25 mg via ORAL

## 2018-09-23 MED ORDER — DIPHENHYDRAMINE HCL 25 MG PO CAPS
ORAL_CAPSULE | ORAL | Status: AC
  Filled 2018-09-23: qty 1

## 2018-09-23 MED ORDER — SODIUM CHLORIDE 0.9 % IV SOLN
510.0000 mg | Freq: Once | INTRAVENOUS | Status: AC
  Administered 2018-09-23: 510 mg via INTRAVENOUS
  Filled 2018-09-23: qty 17

## 2018-09-23 MED ORDER — ACETAMINOPHEN 325 MG PO TABS
ORAL_TABLET | ORAL | Status: AC
  Filled 2018-09-23: qty 2

## 2018-09-23 NOTE — Patient Instructions (Signed)
Coronavirus (COVID-19) Are you at risk?  Are you at risk for the Coronavirus (COVID-19)?  To be considered HIGH RISK for Coronavirus (COVID-19), you have to meet the following criteria:  . Traveled to [place], [place], [place], [place] or [place]; or in the [place] to [place], [place], [place], or [place]; and have fever, cough, and shortness of breath within the last 2 weeks of travel OR . Been in close contact with a person diagnosed with COVID-19 within the last 2 weeks and have fever, cough, and shortness of breath . IF YOU DO NOT MEET THESE CRITERIA, YOU ARE CONSIDERED LOW RISK FOR COVID-19.  What to do if you are HIGH RISK for COVID-19?  . If you are having a medical emergency, call 911. . Seek medical care right away. Before you go to a doctor's office, urgent care or emergency department, call ahead and tell them about your recent travel, contact with someone diagnosed with COVID-19, and your symptoms. You should receive instructions from your physician's office regarding next steps of care.  . When you arrive at healthcare provider, tell the healthcare staff immediately you have returned from visiting [place], [place], [place], [place] or [place]; or traveled in the [place] to [place], [place], [place], or [place]; in the last two weeks or you have been in close contact with a person diagnosed with COVID-19 in the last 2 weeks.   . Tell the health care staff about your symptoms: fever, cough and shortness of breath. . After you have been seen by a medical provider, you will be either: o Tested for (COVID-19) and discharged home on quarantine except to seek medical care if symptoms worsen, and asked to  - Stay home and avoid contact with others until you get your results (4-5 days)  - Avoid travel on public transportation if possible (such as bus, train, or airplane) or o Sent to the Emergency Department by EMS for evaluation, COVID-19 testing, and possible  admission depending on your condition and test results.  What to do if you are LOW RISK for COVID-19?  Reduce your risk of any infection by using the same precautions used for avoiding the common cold or flu:  . Wash your hands often with soap and warm water for at least 20 seconds.  If soap and water are not readily available, use an alcohol-based hand sanitizer with at least 60% alcohol.  . If coughing or sneezing, cover your mouth and nose by coughing or sneezing into the elbow areas of your shirt or coat, into a tissue or into your sleeve (not your hands). . Avoid shaking hands with others and consider head nods or verbal greetings only. . Avoid touching your eyes, nose, or mouth with unwashed hands.  . Avoid close contact with people who are sick. . Avoid places or events with large numbers of people in one location, like concerts or sporting events. . Carefully consider travel plans you have or are making. . If you are planning any travel outside or inside the [place], visit the CDC's Travelers' Health webpage for the latest health notices. . If you have some symptoms but not all symptoms, continue to monitor at home and seek medical attention if your symptoms worsen. . If you are having a medical emergency, call 911.  ADDITIONAL HEALTHCARE OPTIONS FOR PATIENTS  [facility] / e-Visit: [email or web address]         [facility]: [phone]  [facility]: [phone]                   [facility]: [phone]   Ferumoxytol injection What is this medicine? FERUMOXYTOL is an iron complex. Iron is used to make healthy red blood cells, which carry oxygen and nutrients throughout the body. This medicine is used to treat iron deficiency anemia. This medicine may be used for other purposes; ask your health care provider or pharmacist if you have questions. COMMON BRAND NAME(S): Feraheme What should I  tell my health care provider before I take this medicine? They need to know if you have any of these conditions: -anemia not caused by low iron levels -high levels of iron in the blood -magnetic resonance imaging (MRI) test scheduled -an unusual or allergic reaction to iron, other medicines, foods, dyes, or preservatives -pregnant or trying to get pregnant -breast-feeding How should I use this medicine? This medicine is for injection into a vein. It is given by a health care professional in a hospital or clinic setting. Talk to your pediatrician regarding the use of this medicine in children. Special care may be needed. Overdosage: If you think you have taken too much of this medicine contact a poison control center or emergency room at once. NOTE: This medicine is only for you. Do not share this medicine with others. What if I miss a dose? It is important not to miss your dose. Call your doctor or health care professional if you are unable to keep an appointment. What may interact with this medicine? This medicine may interact with the following medications: -other iron products This list may not describe all possible interactions. Give your health care provider a list of all the medicines, herbs, non-prescription drugs, or dietary supplements you use. Also tell them if you smoke, drink alcohol, or use illegal drugs. Some items may interact with your medicine. What should I watch for while using this medicine? Visit your doctor or healthcare professional regularly. Tell your doctor or healthcare professional if your symptoms do not start to get better or if they get worse. You may need blood work done while you are taking this medicine. You may need to follow a special diet. Talk to your doctor. Foods that contain iron include: whole grains/cereals, dried fruits, beans, or peas, leafy green vegetables, and organ meats (liver, kidney). What side effects may I notice from receiving this  medicine? Side effects that you should report to your doctor or health care professional as soon as possible: -allergic reactions like skin rash, itching or hives, swelling of the face, lips, or tongue -breathing problems -changes in blood pressure -feeling faint or lightheaded, falls -fever or chills -flushing, sweating, or hot feelings -swelling of the ankles or feet Side effects that usually do not require medical attention (report to your doctor or health care professional if they continue or are bothersome): -diarrhea -headache -nausea, vomiting -stomach pain This list may not describe all possible side effects. Call your doctor for medical advice about side effects. You may report side effects to FDA at 1-800-FDA-1088. Where should I keep my medicine? This drug is given in a hospital or clinic and will not be stored at home. NOTE: This sheet is a summary. It may not cover all possible information. If you have questions about this medicine, talk to your doctor, pharmacist, or health care provider.  2019 Elsevier/Gold Standard (2016-08-10 20:21:10)  

## 2018-09-24 ENCOUNTER — Encounter: Payer: Self-pay | Admitting: Hematology and Oncology

## 2018-09-26 ENCOUNTER — Encounter: Payer: Self-pay | Admitting: *Deleted

## 2018-09-27 ENCOUNTER — Telehealth: Payer: Self-pay | Admitting: Hematology and Oncology

## 2018-09-27 NOTE — Telephone Encounter (Signed)
Scheduled appt per 3/23 sch message- pt aware of April appts.

## 2018-10-08 MED ORDER — TRAMADOL HCL 50 MG PO TABS: 50.0000 mg | ORAL_TABLET | Freq: Three times a day (TID) | ORAL | 0 refills | Status: DC | PRN

## 2018-10-18 ENCOUNTER — Other Ambulatory Visit: Payer: Medicare Other

## 2018-10-18 ENCOUNTER — Ambulatory Visit: Payer: Medicare Other | Admitting: Hematology and Oncology

## 2018-10-24 NOTE — Assessment & Plan Note (Addendum)
Hospitalization 08/11/2018: Severe GI bleed hemoglobin 4.9 (internal hemorrhoids and angiodysplasia of the colon) Received blood transfusions 08/22/2018 hemoglobin 9.3 (patient's baseline is 11 to 12 g on 04/20/2018) Ferritin while in hospital: 2 (10 months ago it was 6 and 4 years ago it was 8)  IV iron treatment: September 2019, March 2020 Lab review: 10/25/2018: Hemoglobin 10.4 (was 9), MCV 95.8, RDW 16.9, ferritin 39, iron saturation 16% (was 9%) We will continue to monitor the labs.  She does not need IV iron at this time. Return to clinic in 2 months with labs done 2 days ahead of visit.

## 2018-10-25 ENCOUNTER — Inpatient Hospital Stay: Payer: Medicare Other | Attending: Hematology and Oncology

## 2018-10-25 ENCOUNTER — Other Ambulatory Visit: Payer: Self-pay

## 2018-10-25 DIAGNOSIS — D500 Iron deficiency anemia secondary to blood loss (chronic): Secondary | ICD-10-CM | POA: Diagnosis not present

## 2018-10-25 LAB — CBC WITH DIFFERENTIAL (CANCER CENTER ONLY)
Abs Immature Granulocytes: 0.04 10*3/uL (ref 0.00–0.07)
Basophils Absolute: 0.1 10*3/uL (ref 0.0–0.1)
Basophils Relative: 1 %
Eosinophils Absolute: 0.5 10*3/uL (ref 0.0–0.5)
Eosinophils Relative: 7 %
HCT: 34 % — ABNORMAL LOW (ref 36.0–46.0)
Hemoglobin: 10.4 g/dL — ABNORMAL LOW (ref 12.0–15.0)
Immature Granulocytes: 1 %
Lymphocytes Relative: 15 %
Lymphs Abs: 1.1 10*3/uL (ref 0.7–4.0)
MCH: 29.3 pg (ref 26.0–34.0)
MCHC: 30.6 g/dL (ref 30.0–36.0)
MCV: 95.8 fL (ref 80.0–100.0)
Monocytes Absolute: 0.6 10*3/uL (ref 0.1–1.0)
Monocytes Relative: 8 %
Neutro Abs: 4.9 10*3/uL (ref 1.7–7.7)
Neutrophils Relative %: 68 %
Platelet Count: 294 10*3/uL (ref 150–400)
RBC: 3.55 MIL/uL — ABNORMAL LOW (ref 3.87–5.11)
RDW: 16.9 % — ABNORMAL HIGH (ref 11.5–15.5)
WBC Count: 7.2 10*3/uL (ref 4.0–10.5)
nRBC: 0 % (ref 0.0–0.2)

## 2018-10-25 LAB — IRON AND TIBC
Iron: 46 ug/dL (ref 41–142)
Saturation Ratios: 16 % — ABNORMAL LOW (ref 21–57)
TIBC: 294 ug/dL (ref 236–444)
UIBC: 247 ug/dL (ref 120–384)

## 2018-10-25 LAB — FERRITIN: Ferritin: 39 ng/mL (ref 11–307)

## 2018-10-26 ENCOUNTER — Telehealth: Payer: Self-pay | Admitting: Hematology and Oncology

## 2018-10-26 NOTE — Telephone Encounter (Signed)
Called patient regarding upcoming Webex appointment, patient would rather this be a telephone visit.

## 2018-10-26 NOTE — Progress Notes (Signed)
  HEMATOLOGY-ONCOLOGY TELEPHONE VISIT PROGRESS NOTE  I connected with [name] on 10/27/2018 at  3:30 PM EDT by telephone and verified that I am speaking with the correct person using two identifiers.  I discussed the limitations, risks, security and privacy concerns of performing an evaluation and management service by telephone and the availability of in person appointments.  I also discussed with the patient that there may be a patient responsible charge related to this service. The patient expressed understanding and agreed to proceed.   History of Present Illness: Chelsea Keller is a 84 y.o. female with above-mentioned history of iron deficiency anemia treated with 2 doses of IV iron infusion. A chest CT from 09/14/18 showed no evidence of PE. Her lab work from 10/25/18 showed: iron saturation 16%, ferritin 39, Hg 10.4. She presents today over Webex to review her recent lab work and to decide if further IV iron treatment is needed.   REVIEW OF SYSTEMS:   Constitutional: Complains of fatigue but otherwise doing well. Eyes: Denies blurriness of vision Ears, nose, mouth, throat, and face: Denies mucositis or sore throat Respiratory: Denies cough, dyspnea or wheezes Cardiovascular: Denies palpitation, chest discomfort Gastrointestinal:  Denies nausea, heartburn or change in bowel habits Skin: Denies abnormal skin rashes Lymphatics: Denies new lymphadenopathy or easy bruising Neurological:Denies numbness, tingling or new weaknesses Behavioral/Psych: Mood is stable, no new changes  Extremities: No lower extremity edema Breast: denies any pain or lumps or nodules in either breasts All other systems were reviewed with the patient and are negative.  Observations/Objective:    Assessment Plan:  Iron deficiency anemia due to chronic blood loss Hospitalization 08/11/2018: Severe GI bleed hemoglobin 4.9 (internal hemorrhoids and angiodysplasia of the colon) Received blood transfusions 08/22/2018  hemoglobin 9.3 (patient's baseline is 11 to 12 g on 04/20/2018) Ferritin while in hospital: 2 (10 months ago it was 6 and 4 years ago it was 8)  IV iron treatment: September 2019, March 2020 Side effect: Diarrhea for 3 days Patient states that she cannot tolerate IV iron treatments.  The diarrhea is so profound.  I discussed with her that if in the future she needs IV iron we can give her Lomotil as prophylaxis.  Lab review: 10/25/2018: Hemoglobin 10.4 (was 9), MCV 95.8, RDW 16.9, ferritin 39, iron saturation 16% (was 9%) We will continue to monitor the labs.  She does not need IV iron at this time.  Return to clinic in 3 months with labs done 2 days ahead of visit.  I discussed the assessment and treatment plan with the patient. The patient was provided an opportunity to ask questions and all were answered. The patient agreed with the plan and demonstrated an understanding of the instructions. The patient was advised to call back or seek an in-person evaluation if the symptoms worsen or if the condition fails to improve as anticipated.   I provided 15 minutes of non-face-to-face time during this encounter.   Rulon Eisenmenger, MD 10/27/2018    I, Molly Dorshimer, am acting as scribe for Nicholas Lose, MD.  I have reviewed the above documentation for accuracy and completeness, and I agree with the above.

## 2018-10-27 ENCOUNTER — Inpatient Hospital Stay (HOSPITAL_BASED_OUTPATIENT_CLINIC_OR_DEPARTMENT_OTHER): Payer: Medicare Other | Admitting: Hematology and Oncology

## 2018-10-27 DIAGNOSIS — D500 Iron deficiency anemia secondary to blood loss (chronic): Secondary | ICD-10-CM | POA: Diagnosis not present

## 2018-10-31 ENCOUNTER — Ambulatory Visit (INDEPENDENT_AMBULATORY_CARE_PROVIDER_SITE_OTHER): Payer: Medicare Other | Admitting: Internal Medicine

## 2018-10-31 ENCOUNTER — Encounter: Payer: Self-pay | Admitting: Internal Medicine

## 2018-10-31 ENCOUNTER — Other Ambulatory Visit: Payer: Self-pay

## 2018-10-31 ENCOUNTER — Telehealth: Payer: Self-pay | Admitting: Internal Medicine

## 2018-10-31 DIAGNOSIS — S161XXA Strain of muscle, fascia and tendon at neck level, initial encounter: Secondary | ICD-10-CM | POA: Diagnosis not present

## 2018-10-31 MED ORDER — PREDNISONE 20 MG PO TABS: 40.0000 mg | ORAL_TABLET | Freq: Every day | ORAL | 0 refills | Status: DC

## 2018-10-31 MED ORDER — GABAPENTIN 300 MG PO CAPS: 300.0000 mg | ORAL_CAPSULE | Freq: Two times a day (BID) | ORAL | 0 refills | Status: DC

## 2018-10-31 MED ORDER — TIZANIDINE HCL 2 MG PO TABS: 2.0000 mg | ORAL_TABLET | Freq: Three times a day (TID) | ORAL | 0 refills | Status: DC | PRN

## 2018-10-31 NOTE — Progress Notes (Signed)
Subjective:    Patient ID: Chelsea Keller, female    DOB: 10/24/1934, 84 y.o.   MRN: [id number]  HPI Virtual visit due to neck pain Identification done Reviewed billing and she gave consent She is at home with daughter, I am in my office  Started with neck pain at the base of her head Now going up her head From left to right Also goes down into her shoulders Terrible pain that started around 10 days ago Started mild but then got severe  Tylenol some help-- 1500 bid Afraid to try heating pad  Current Outpatient Medications on File Prior to Visit  Medication Sig Dispense Refill  . acetaminophen (TYLENOL) 500 MG tablet Take 1,000-1,500 mg by mouth every 6 (six) hours as needed for moderate pain or headache (pain).     Marland Kitchen aspirin 81 MG tablet Take 81 mg by mouth daily.    . furosemide (LASIX) 80 MG tablet Take 80 mg by mouth daily.     Marland Kitchen gabapentin (NEURONTIN) 300 MG capsule Take 1 capsule (300 mg total) by mouth 3 (three) times daily. (Patient taking differently: Take 300-600 mg by mouth 2 (two) times daily. Take 1 capsule in the morning and Take 2 capsules at bedtime) 270 capsule 3  . potassium chloride SA (K-DUR,KLOR-CON) 20 MEQ tablet TAKE 2 TABLETS (40 MEQ TOTAL) DAILY 180 tablet 4  . traMADol (ULTRAM) 50 MG tablet Take 1 tablet (50 mg total) by mouth 3 (three) times daily as needed for moderate pain. 60 tablet 0  . triamterene-hydrochlorothiazide (MAXZIDE-25) 37.5-25 MG tablet Take 1 tablet by mouth daily. 90 tablet 3   No current facility-administered medications on file prior to visit.     Allergies  Allergen Reactions  . Codeine Sulfate Shortness Of Breath  . Celecoxib Other (See Comments)    Caused vaginal bleeding  . Erythromycin Base Nausea And Vomiting    Past Medical History:  Diagnosis Date  . Allergy   . Anemia   . Anxiety   . Arthritis   . Arthritis of sacroiliac joint of both sides 11/12/2017  . Bilateral pulmonary embolism ([facility]) 06/23/2017  . Chronic  diastolic CHF (congestive heart failure) (HCC)    a. Echo 1/16:  mild LVH, EF normal, grade 1 DD, MAC  . Chronic venous insufficiency    chronic LE edema  . DDD (degenerative disc disease), lumbar 11/12/2017  . Degenerative joint disease (DJD) of hip, Bilateral  11/12/2017  . Depression   . Fibromyalgia    constant pain  . Hx of cardiac catheterization    a. LHC in [place] "ok" per patient with mild plaque in a single vessel - records not available  . Hx of cardiovascular stress test    a. Nuclear study in 2008 normal  . Hx of colonic polyps   . Hypertension   . Hypertriglyceridemia   . Impaired fasting glucose   . PONV (postoperative nausea and vomiting)   . PUD (peptic ulcer disease)    hx of gastric ulcer  . Pulmonary emboli ([place]) 06/2017  . Vitamin B12 deficiency     Past Surgical History:  Procedure Laterality Date  . ABDOMINAL HYSTERECTOMY    . CATARACT EXTRACTION  10/2003   OD  . CHOLECYSTECTOMY    . COLONOSCOPY W/ POLYPECTOMY    . COLONOSCOPY WITH PROPOFOL N/A 10/15/2014   Procedure: COLONOSCOPY WITH PROPOFOL;  Surgeon: Ladene Artist, MD;  Location: [facility] ENDOSCOPY;  Service: Endoscopy;  Laterality: N/A;  . COLONOSCOPY WITH PROPOFOL  N/A 03/23/2018   Procedure: COLONOSCOPY WITH PROPOFOL;  Surgeon: Gatha Mayer, MD;  Location[facility] ENDOSCOPY;  Service: Endoscopy;  Laterality: N/A;  . ESOPHAGOGASTRODUODENOSCOPY (EGD) WITH PROPOFOL N/A 10/15/2014   Procedure: ESOPHAGOGASTRODUODENOSCOPY (EGD) WITH PROPOFOL;  Surgeon: Ladene Artist, MD;  Location: [facility] ENDOSCOPY;  Service: Endoscopy;  Laterality: N/A;  . EXTERNAL FIXATION ANKLE FRACTURE     Fx.  left ankle-fixation with pins later removed sec to infection 1985  . EXTERNAL FIXATION WRIST FRACTURE  1985   left with pins  . EYE SURGERY    . FEMUR FRACTURE SURGERY  06/2009  . FRACTURE SURGERY    . HARDWARE REMOVAL Left 10/05/2013   Procedure: HARDWARE REMOVAL LEFT DISTAL FEMUR;  Surgeon: Rozanna Box, MD;  Location: [facility];  Service:  Orthopedics;  Laterality: Left;  . HEMIARTHROPLASTY SHOULDER FRACTURE  06/2009  . HOT HEMOSTASIS N/A 03/23/2018   Procedure: HOT HEMOSTASIS (ARGON PLASMA COAGULATION/BICAP);  Surgeon: Gatha Mayer, MD;  Location: [facility] ENDOSCOPY;  Service: Endoscopy;  Laterality: N/A;  . JOINT REPLACEMENT    . STERIOD INJECTION Right 10/05/2013   Procedure: STEROID INJECTION;  Surgeon: Rozanna Box, MD;  Location: [facility];  Service: Orthopedics;  Laterality: Right;  . TONSILLECTOMY    . TONSILLECTOMY    . TOTAL KNEE ARTHROPLASTY  03/09   left    Family History  Problem Relation Age of Onset  . Depression Mother   . Cancer Mother        uterine cancer  . Heart attack Father   . Cancer Brother        prostate cancer  . Diabetes Maternal Aunt   . Arthritis Brother   . Asthma Brother   . Stroke Maternal Grandmother   . Pulmonary embolism Daughter     Social History   Socioeconomic History  . Marital status: Widowed    Spouse name: Not on file  . Number of children: 4  . Years of education: Not on file  . Highest education level: Not on file  Occupational History  . Occupation: retired crossing guard  . 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 Financial resource strain: Not on file  . Food insecurity:    Worry: Not on file    Inability: Not on file  . Transportation needs:    Medical: Not on file    Non-medical: Not on file  Tobacco Use  . Smoking status: Former Smoker    Packs/day: 1.00    Years: 40.00    Pack years: 40.00    Types: Cigarettes    Last attempt to quit: 07/06/1993    Years since quitting: 25.3  . Smokeless tobacco: Never Used  Substance and Sexual Activity  . Alcohol use: No    Alcohol/week: 0.0 standard drinks  . Drug use: No  . Sexual activity: Not on file  Lifestyle  . Physical activity:    Days per week: Not on file    Minutes per session: Not on file  . Stress: Not on file  Relationships  . Social connections:    Talks on phone: Not  on file    Gets together: Not on file    Attends religious service: Not on file    Active member of club or organization: Not on file    Attends meetings of clubs or organizations: Not on file    Relationship status: Not on file  . Intimate partner violence:    Fear of  current or ex partner: Not on file    Emotionally abused: Not on file    Physically abused: Not on file    Forced sexual activity: Not on file  Other Topics Concern  . 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 Chronic hand weakness from arthritis--no change Some nausea after eating--- this happens when she has pain Able to walk around some--but it causes throbbing    Objective:   Physical Exam  Constitutional: She appears well-developed. No distress.  Neck:  Notes tightness along cervical paraspinals Flexion is fairly normal Doesn't even extend to neutral position Very limited tilt or rotation           Assessment & Plan:

## 2018-10-31 NOTE — Telephone Encounter (Signed)
Rx sent electronically.  

## 2018-10-31 NOTE — Telephone Encounter (Signed)
Best number [phone]  Pt needs a 2 week supply of gabapentin sent to [facility] [place]

## 2018-10-31 NOTE — Telephone Encounter (Signed)
Pt has VV today

## 2018-10-31 NOTE — Assessment & Plan Note (Signed)
Seems to have apparent muscle injury over the past 10 days I looked at her sleep position in her recliner---and her head was in neutral position No weakness in hands to suggest cervical radiculopathy Will continue tylenol, try heat Prednisone for 3 days and muscle relaxer If not better by next week---will try to set up PT with [facility]

## 2018-11-17 ENCOUNTER — Inpatient Hospital Stay: Payer: Medicare Other | Attending: Hematology and Oncology

## 2018-12-01 DIAGNOSIS — D649 Anemia, unspecified: Secondary | ICD-10-CM

## 2018-12-02 ENCOUNTER — Other Ambulatory Visit: Payer: Self-pay

## 2018-12-02 ENCOUNTER — Other Ambulatory Visit (INDEPENDENT_AMBULATORY_CARE_PROVIDER_SITE_OTHER): Payer: Medicare Other

## 2018-12-02 DIAGNOSIS — D649 Anemia, unspecified: Secondary | ICD-10-CM | POA: Diagnosis not present

## 2018-12-02 LAB — CBC
HCT: 29 % — ABNORMAL LOW (ref 36.0–46.0)
Hemoglobin: 9.3 g/dL — ABNORMAL LOW (ref 12.0–15.0)
MCHC: 32 g/dL (ref 30.0–36.0)
MCV: 83.3 fl (ref 78.0–100.0)
Platelets: 332 10*3/uL (ref 150.0–400.0)
RBC: 3.48 Mil/uL — ABNORMAL LOW (ref 3.87–5.11)
RDW: 17.5 % — ABNORMAL HIGH (ref 11.5–15.5)
WBC: 9.3 10*3/uL (ref 4.0–10.5)

## 2018-12-02 NOTE — Telephone Encounter (Signed)
Please set up a time for her to come today to drive by for blood work

## 2018-12-07 ENCOUNTER — Other Ambulatory Visit: Payer: Self-pay | Admitting: Orthopedic Surgery

## 2018-12-07 DIAGNOSIS — M461 Sacroiliitis, not elsewhere classified: Secondary | ICD-10-CM | POA: Diagnosis not present

## 2018-12-07 DIAGNOSIS — M47818 Spondylosis without myelopathy or radiculopathy, sacral and sacrococcygeal region: Secondary | ICD-10-CM

## 2018-12-16 ENCOUNTER — Other Ambulatory Visit: Payer: Self-pay | Admitting: *Deleted

## 2018-12-16 DIAGNOSIS — D500 Iron deficiency anemia secondary to blood loss (chronic): Secondary | ICD-10-CM

## 2018-12-19 ENCOUNTER — Inpatient Hospital Stay: Payer: Medicare Other | Attending: Hematology and Oncology

## 2018-12-19 ENCOUNTER — Other Ambulatory Visit: Payer: Self-pay

## 2018-12-19 DIAGNOSIS — D500 Iron deficiency anemia secondary to blood loss (chronic): Secondary | ICD-10-CM | POA: Diagnosis not present

## 2018-12-19 LAB — CBC WITH DIFFERENTIAL (CANCER CENTER ONLY)
Abs Immature Granulocytes: 0.11 10*3/uL — ABNORMAL HIGH (ref 0.00–0.07)
Basophils Absolute: 0.1 10*3/uL (ref 0.0–0.1)
Basophils Relative: 0 %
Eosinophils Absolute: 0.4 10*3/uL (ref 0.0–0.5)
Eosinophils Relative: 3 %
HCT: 31.8 % — ABNORMAL LOW (ref 36.0–46.0)
Hemoglobin: 9.2 g/dL — ABNORMAL LOW (ref 12.0–15.0)
Immature Granulocytes: 1 %
Lymphocytes Relative: 13 %
Lymphs Abs: 1.8 10*3/uL (ref 0.7–4.0)
MCH: 24.4 pg — ABNORMAL LOW (ref 26.0–34.0)
MCHC: 28.9 g/dL — ABNORMAL LOW (ref 30.0–36.0)
MCV: 84.4 fL (ref 80.0–100.0)
Monocytes Absolute: 1.4 10*3/uL — ABNORMAL HIGH (ref 0.1–1.0)
Monocytes Relative: 10 %
Neutro Abs: 10.3 10*3/uL — ABNORMAL HIGH (ref 1.7–7.7)
Neutrophils Relative %: 73 %
Platelet Count: 388 10*3/uL (ref 150–400)
RBC: 3.77 MIL/uL — ABNORMAL LOW (ref 3.87–5.11)
RDW: 17.3 % — ABNORMAL HIGH (ref 11.5–15.5)
WBC Count: 14.1 10*3/uL — ABNORMAL HIGH (ref 4.0–10.5)
nRBC: 0 % (ref 0.0–0.2)

## 2018-12-19 LAB — CMP (CANCER CENTER ONLY)
ALT: 15 U/L (ref 0–44)
AST: 17 U/L (ref 15–41)
Albumin: 3.4 g/dL — ABNORMAL LOW (ref 3.5–5.0)
Alkaline Phosphatase: 88 U/L (ref 38–126)
Anion gap: 11 (ref 5–15)
BUN: 20 mg/dL (ref 8–23)
CO2: 26 mmol/L (ref 22–32)
Calcium: 9 mg/dL (ref 8.9–10.3)
Chloride: 104 mmol/L (ref 98–111)
Creatinine: 0.93 mg/dL (ref 0.44–1.00)
GFR, Est AFR Am: >60 mL/min
GFR, Estimated: 57 mL/min — ABNORMAL LOW
Glucose, Bld: 94 mg/dL (ref 70–99)
Potassium: 3.8 mmol/L (ref 3.5–5.1)
Sodium: 141 mmol/L (ref 135–145)
Total Bilirubin: 0.5 mg/dL (ref 0.3–1.2)
Total Protein: 6 g/dL — ABNORMAL LOW (ref 6.5–8.1)

## 2018-12-20 ENCOUNTER — Telehealth: Payer: Self-pay | Admitting: Hematology and Oncology

## 2018-12-20 LAB — IRON AND TIBC
Iron: 23 ug/dL — ABNORMAL LOW (ref 41–142)
Saturation Ratios: 6 % — ABNORMAL LOW (ref 21–57)
TIBC: 386 ug/dL (ref 236–444)
UIBC: 363 ug/dL (ref 120–384)

## 2018-12-20 LAB — FERRITIN: Ferritin: 6 ng/mL — ABNORMAL LOW (ref 11–307)

## 2018-12-20 NOTE — Progress Notes (Signed)
°  HEMATOLOGY-ONCOLOGY TELEPHONE VISIT PROGRESS NOTE  I connected with Chelsea Keller on 12/21/2018 at  1:30 PM EDT by telephone and verified that I am speaking with the correct person using two identifiers.  I discussed the limitations, risks, security and privacy concerns of performing an evaluation and management service by telephone and the availability of in person appointments.  I also discussed with the patient that there may be a patient responsible charge related to this service. The patient expressed understanding and agreed to proceed.   History of Present Illness: Chelsea Keller is a 84 y.o. female with above-mentioned history of iron deficiency anemia previously treated with IV iron. Her most recent labs from 12/19/18 showed: WBC 14.1, ANC 10.3, Hg 9.2, HCT 31.8, iron saturation 6%, ferritin 6. She is over the phone today to review her labs.    Assessment Plan:  Iron deficiency anemia due to chronic blood loss Hospitalization 08/11/2018: Severe GI bleed hemoglobin 4.9 (internal hemorrhoids and angiodysplasia of the colon)Received blood transfusions 08/22/2018 hemoglobin 9.3 (patient's baseline is 11 to 12 g on 04/20/2018) Ferritin while in hospital: 2 (10 months ago it was 6 and 4 years ago it was 8)  IV iron treatment: September 2019, March 2020  Lab review: 12/19/2018: Hemoglobin 9.2, MCV 84.4, iron saturation 6%, ferritin 6 Based on these results, I recommended IV iron therapy. We will set her up for IV iron 2 doses 1 week apart. IV iron infusion related toxicities: Patient gets profound diarrhea which last for 2 to 3 days after iron infusion.  She apparently gets dehydrated and goes into kidney failure.  She is very anxious about receiving any IV iron.  She tells me the same thing happens when she takes oral iron replacement.  Plan: We will use Injectafer instead of Feraheme. I sent a prescription for Lomotil that she will start the night before IV iron infusion and she will take it 3  times a day for 3 days.  It appears that she might need iron more often to keep up with her needs. After the IV iron we can see her in 3 months with labs and follow-up.    I discussed the assessment and treatment plan with the patient. The patient was provided an opportunity to ask questions and all were answered. The patient agreed with the plan and demonstrated an understanding of the instructions. The patient was advised to call back or seek an in-person evaluation if the symptoms worsen or if the condition fails to improve as anticipated.   I provided 15 minutes of non-face-to-face time during this encounter.   Rulon Eisenmenger, MD 12/21/2018    I, Molly Dorshimer, am acting as scribe for Nicholas Lose, MD.  I have reviewed the above documentation for accuracy and completeness, and I agree with the above.

## 2018-12-20 NOTE — Telephone Encounter (Signed)
Confirmed appt and verified info. °

## 2018-12-21 ENCOUNTER — Inpatient Hospital Stay (HOSPITAL_BASED_OUTPATIENT_CLINIC_OR_DEPARTMENT_OTHER): Payer: Medicare Other | Admitting: Hematology and Oncology

## 2018-12-21 DIAGNOSIS — D508 Other iron deficiency anemias: Secondary | ICD-10-CM

## 2018-12-21 DIAGNOSIS — Z79899 Other long term (current) drug therapy: Secondary | ICD-10-CM

## 2018-12-21 DIAGNOSIS — D500 Iron deficiency anemia secondary to blood loss (chronic): Secondary | ICD-10-CM

## 2018-12-21 MED ORDER — DIPHENOXYLATE-ATROPINE 2.5-0.025 MG PO TABS: 1.0000 | ORAL_TABLET | Freq: Three times a day (TID) | ORAL | 1 refills | Status: DC

## 2018-12-21 NOTE — Assessment & Plan Note (Signed)
Hospitalization 08/11/2018: Severe GI bleed hemoglobin 4.9 (internal hemorrhoids and angiodysplasia of the colon)Received blood transfusions 08/22/2018 hemoglobin 9.3 (patient's baseline is 11 to 12 g on 04/20/2018) Ferritin while in hospital: 2 (10 months ago it was 6 and 4 years ago it was 8)  IV iron treatment: September 2019, March 2020  Lab review: 12/19/2018: Hemoglobin 9.2, MCV 84.4, iron saturation 6%, ferritin 6 Based on these results, I recommended IV iron therapy. We will set her up for IV iron 2 doses 1 week apart.  It appears that she might need iron more often to keep up with her needs. After the IV iron we can see her in 3 months with labs and follow-up.

## 2018-12-26 ENCOUNTER — Telehealth: Payer: Self-pay | Admitting: Hematology and Oncology

## 2018-12-26 NOTE — Telephone Encounter (Signed)
I talk with patient she was fine with the appointment because of the treatment area

## 2018-12-28 ENCOUNTER — Other Ambulatory Visit: Payer: Self-pay

## 2018-12-28 ENCOUNTER — Inpatient Hospital Stay: Payer: Medicare Other

## 2018-12-28 ENCOUNTER — Ambulatory Visit
Admission: RE | Admit: 2018-12-28 | Discharge: 2018-12-28 | Disposition: A | Discharge status: Home / Self Care | Payer: Medicare Other | Source: Ambulatory Visit | Attending: Orthopedic Surgery | Admitting: Orthopedic Surgery

## 2018-12-28 VITALS — BP 154/93 | HR 89 | Temp 98.7°F | Resp 16

## 2018-12-28 DIAGNOSIS — M47818 Spondylosis without myelopathy or radiculopathy, sacral and sacrococcygeal region: Secondary | ICD-10-CM

## 2018-12-28 DIAGNOSIS — M533 Sacrococcygeal disorders, not elsewhere classified: Secondary | ICD-10-CM | POA: Diagnosis not present

## 2018-12-28 DIAGNOSIS — G8929 Other chronic pain: Secondary | ICD-10-CM | POA: Diagnosis not present

## 2018-12-28 DIAGNOSIS — D500 Iron deficiency anemia secondary to blood loss (chronic): Secondary | ICD-10-CM

## 2018-12-28 MED ORDER — METHYLPREDNISOLONE ACETATE 40 MG/ML INJ SUSP (RADIOLOG
120.0000 mg | Freq: Once | INTRAMUSCULAR | Status: AC
  Administered 2018-12-28: 120 mg via EPIDURAL

## 2018-12-28 MED ORDER — SODIUM CHLORIDE 0.9 % IV SOLN
750.0000 mg | Freq: Once | INTRAVENOUS | Status: AC
  Administered 2018-12-28: 750 mg via INTRAVENOUS
  Filled 2018-12-28: qty 15

## 2018-12-28 MED ORDER — SODIUM CHLORIDE 0.9 % IV SOLN
Freq: Once | INTRAVENOUS | Status: AC
  Administered 2018-12-28: 16:00:00 via INTRAVENOUS
  Filled 2018-12-28: qty 250

## 2018-12-28 MED ORDER — IOPAMIDOL (ISOVUE-M 200) INJECTION 41%
1.0000 mL | Freq: Once | INTRAMUSCULAR | Status: AC
  Administered 2018-12-28: 1 mL via INTRA_ARTICULAR

## 2018-12-28 NOTE — Patient Instructions (Signed)
Ferric carboxymaltose injection What is this medicine? FERRIC CARBOXYMALTOSE (ferr-ik car-box-ee-mol-toes) is an iron complex. Iron is used to make healthy red blood cells, which carry oxygen and nutrients throughout the body. This medicine is used to treat anemia in people with chronic kidney disease or people who cannot take iron by mouth. This medicine may be used for other purposes; ask your health care provider or pharmacist if you have questions. COMMON BRAND NAME(S): Injectafer What should I tell my health care provider before I take this medicine? They need to know if you have any of these conditions: -high levels of iron in the blood -liver disease -an unusual or allergic reaction to iron, other medicines, foods, dyes, or preservatives -pregnant or trying to get pregnant -breast-feeding How should I use this medicine? This medicine is for infusion into a vein. It is given by a health care professional in a hospital or clinic setting. Talk to your pediatrician regarding the use of this medicine in children. Special care may be needed. Overdosage: If you think you have taken too much of this medicine contact a poison control center or emergency room at once. NOTE: This medicine is only for you. Do not share this medicine with others. What if I miss a dose? It is important not to miss your dose. Call your doctor or health care professional if you are unable to keep an appointment. What may interact with this medicine? Do not take this medicine with any of the following medications: -deferoxamine -dimercaprol -other iron products This list may not describe all possible interactions. Give your health care provider a list of all the medicines, herbs, non-prescription drugs, or dietary supplements you use. Also tell them if you smoke, drink alcohol, or use illegal drugs. Some items may interact with your medicine. What should I watch for while using this medicine? Visit your doctor or  health care professional regularly. Tell your doctor if your symptoms do not start to get better or if they get worse. You may need blood work done while you are taking this medicine. You may need to follow a special diet. Talk to your doctor. Foods that contain iron include: whole grains/cereals, dried fruits, beans, or peas, leafy green vegetables, and organ meats (liver, kidney). What side effects may I notice from receiving this medicine? Side effects that you should report to your doctor or health care professional as soon as possible: -allergic reactions like skin rash, itching or hives, swelling of the face, lips, or tongue -dizziness -facial flushing Side effects that usually do not require medical attention (report to your doctor or health care professional if they continue or are bothersome): -changes in taste -constipation -headache -nausea, vomiting -pain, redness, or irritation at site where injected This list may not describe all possible side effects. Call your doctor for medical advice about side effects. You may report side effects to FDA at 1-800-FDA-1088. Where should I keep my medicine? This drug is given in a hospital or clinic and will not be stored at home. NOTE: This sheet is a summary. It may not cover all possible information. If you have questions about this medicine, talk to your doctor, pharmacist, or health care provider.  2019 Elsevier/Gold Standard (2016-08-06 09:40:29)  Coronavirus (COVID-19) Are you at risk?  Are you at risk for the Coronavirus (COVID-19)?  To be considered HIGH RISK for Coronavirus (COVID-19), you have to meet the following criteria:  . Traveled to [place], [place], [place], [place] or [place]; or in [place]  States to [place], [place], [place], or [place]; and have fever, cough, and shortness of breath within the last 2 weeks of travel OR . Been in close contact with a person diagnosed with COVID-19 within the last 2 weeks and  have fever, cough, and shortness of breath . IF YOU DO NOT MEET THESE CRITERIA, YOU ARE CONSIDERED LOW RISK FOR COVID-19.  What to do if you are HIGH RISK for COVID-19?  Marland Kitchen If you are having a medical emergency, call 911. . Seek medical care right away. Before you go to a doctor's office, urgent care or emergency department, call ahead and tell them about your recent travel, contact with someone diagnosed with COVID-19, and your symptoms. You should receive instructions from your physician's office regarding next steps of care.  . When you arrive at healthcare provider, tell the healthcare staff immediately you have returned from visiting [place], [place], [place], [place] or [place]; or traveled in the [place] to [place], [place], [place], or [place]; in the last two weeks or you have been in close contact with a person diagnosed with COVID-19 in the last 2 weeks.   . Tell the health care staff about your symptoms: fever, cough and shortness of breath. . After you have been seen by a medical provider, you will be either: o Tested for (COVID-19) and discharged home on quarantine except to seek medical care if symptoms worsen, and asked to  - Stay home and avoid contact with others until you get your results (4-5 days)  - Avoid travel on public transportation if possible (such as bus, train, or airplane) or o Sent to the Emergency Department by EMS for evaluation, COVID-19 testing, and possible admission depending on your condition and test results.  What to do if you are LOW RISK for COVID-19?  Reduce your risk of any infection by using the same precautions used for avoiding the common cold or flu:  Marland Kitchen Wash your hands often with soap and warm water for at least 20 seconds.  If soap and water are not readily available, use an alcohol-based hand sanitizer with at least 60% alcohol.  . If coughing or sneezing, cover your mouth and nose by coughing or sneezing into the elbow areas of your  shirt or coat, into a tissue or into your sleeve (not your hands). . Avoid shaking hands with others and consider head nods or verbal greetings only. . Avoid touching your eyes, nose, or mouth with unwashed hands.  . Avoid close contact with people who are sick. . Avoid places or events with large numbers of people in one location, like concerts or sporting events. . Carefully consider travel plans you have or are making. . If you are planning any travel outside or inside the [place], visit the CDC's Travelers' Health webpage for the latest health notices. . If you have some symptoms but not all symptoms, continue to monitor at home and seek medical attention if your symptoms worsen. . If you are having a medical emergency, call 911.   [place] / e-Visit: [email or web address]         [facility]: [facility]: [phone]                   [facility]: [phone]

## 2019-01-04 ENCOUNTER — Inpatient Hospital Stay: Payer: Medicare Other | Attending: Hematology and Oncology

## 2019-01-04 ENCOUNTER — Other Ambulatory Visit: Payer: Self-pay

## 2019-01-04 VITALS — BP 154/58 | HR 88 | Temp 98.3°F | Resp 18

## 2019-01-04 DIAGNOSIS — D500 Iron deficiency anemia secondary to blood loss (chronic): Secondary | ICD-10-CM | POA: Diagnosis not present

## 2019-01-04 MED ORDER — SODIUM CHLORIDE 0.9 % IV SOLN
750.0000 mg | Freq: Once | INTRAVENOUS | Status: AC
  Administered 2019-01-04: 750 mg via INTRAVENOUS
  Filled 2019-01-04: qty 15

## 2019-01-04 MED ORDER — SODIUM CHLORIDE 0.9 % IV SOLN
Freq: Once | INTRAVENOUS | Status: AC
  Administered 2019-01-04: 15:00:00 via INTRAVENOUS
  Filled 2019-01-04: qty 250

## 2019-01-04 NOTE — Patient Instructions (Signed)
Ferric carboxymaltose injection What is this medicine? FERRIC CARBOXYMALTOSE (ferr-ik car-box-ee-mol-toes) is an iron complex. Iron is used to make healthy red blood cells, which carry oxygen and nutrients throughout the body. This medicine is used to treat anemia in people with chronic kidney disease or people who cannot take iron by mouth. This medicine may be used for other purposes; ask your health care provider or pharmacist if you have questions. COMMON BRAND NAME(S): Injectafer What should I tell my health care provider before I take this medicine? They need to know if you have any of these conditions: -high levels of iron in the blood -liver disease -an unusual or allergic reaction to iron, other medicines, foods, dyes, or preservatives -pregnant or trying to get pregnant -breast-feeding How should I use this medicine? This medicine is for infusion into a vein. It is given by a health care professional in a hospital or clinic setting. Talk to your pediatrician regarding the use of this medicine in children. Special care may be needed. Overdosage: If you think you have taken too much of this medicine contact a poison control center or emergency room at once. NOTE: This medicine is only for you. Do not share this medicine with others. What if I miss a dose? It is important not to miss your dose. Call your doctor or health care professional if you are unable to keep an appointment. What may interact with this medicine? Do not take this medicine with any of the following medications: -deferoxamine -dimercaprol -other iron products This list may not describe all possible interactions. Give your health care provider a list of all the medicines, herbs, non-prescription drugs, or dietary supplements you use. Also tell them if you smoke, drink alcohol, or use illegal drugs. Some items may interact with your medicine. What should I watch for while using this medicine? Visit your doctor or  health care professional regularly. Tell your doctor if your symptoms do not start to get better or if they get worse. You may need blood work done while you are taking this medicine. You may need to follow a special diet. Talk to your doctor. Foods that contain iron include: whole grains/cereals, dried fruits, beans, or peas, leafy green vegetables, and organ meats (liver, kidney). What side effects may I notice from receiving this medicine? Side effects that you should report to your doctor or health care professional as soon as possible: -allergic reactions like skin rash, itching or hives, swelling of the face, lips, or tongue -dizziness -facial flushing Side effects that usually do not require medical attention (report to your doctor or health care professional if they continue or are bothersome): -changes in taste -constipation -headache -nausea, vomiting -pain, redness, or irritation at site where injected This list may not describe all possible side effects. Call your doctor for medical advice about side effects. You may report side effects to FDA at 1-800-FDA-1088. Where should I keep my medicine? This drug is given in a hospital or clinic and will not be stored at home. NOTE: This sheet is a summary. It may not cover all possible information. If you have questions about this medicine, talk to your doctor, pharmacist, or health care provider.  2019 Elsevier/Gold Standard (2016-08-06 09:40:29)  Coronavirus (COVID-19) Are you at risk?  Are you at risk for the Coronavirus (COVID-19)?  To be considered HIGH RISK for Coronavirus (COVID-19), you have to meet the following criteria:  . Traveled to [place], [place], [place], [place] or [place]; or in [place]  States to [place], [place], [place], or [place]; and have fever, cough, and shortness of breath within the last 2 weeks of travel OR . Been in close contact with a person diagnosed with COVID-19 within the last 2 weeks and  have fever, cough, and shortness of breath . IF YOU DO NOT MEET THESE CRITERIA, YOU ARE CONSIDERED LOW RISK FOR COVID-19.  What to do if you are HIGH RISK for COVID-19?  Marland Kitchen If you are having a medical emergency, call 911. . Seek medical care right away. Before you go to a doctor's office, urgent care or emergency department, call ahead and tell them about your recent travel, contact with someone diagnosed with COVID-19, and your symptoms. You should receive instructions from your physician's office regarding next steps of care.  . When you arrive at healthcare provider, tell the healthcare staff immediately you have returned from visiting [place], [place], [place], [place] or [place]; or traveled in the [place] to [place], [place], [place], or [place]; in the last two weeks or you have been in close contact with a person diagnosed with COVID-19 in the last 2 weeks.   . Tell the health care staff about your symptoms: fever, cough and shortness of breath. . After you have been seen by a medical provider, you will be either: o Tested for (COVID-19) and discharged home on quarantine except to seek medical care if symptoms worsen, and asked to  - Stay home and avoid contact with others until you get your results (4-5 days)  - Avoid travel on public transportation if possible (such as bus, train, or airplane) or o Sent to the Emergency Department by EMS for evaluation, COVID-19 testing, and possible admission depending on your condition and test results.  What to do if you are LOW RISK for COVID-19?  Reduce your risk of any infection by using the same precautions used for avoiding the common cold or flu:  Marland Kitchen Wash your hands often with soap and warm water for at least 20 seconds.  If soap and water are not readily available, use an alcohol-based hand sanitizer with at least 60% alcohol.  . If coughing or sneezing, cover your mouth and nose by coughing or sneezing into the elbow areas of your  shirt or coat, into a tissue or into your sleeve (not your hands). . Avoid shaking hands with others and consider head nods or verbal greetings only. . Avoid touching your eyes, nose, or mouth with unwashed hands.  . Avoid close contact with people who are sick. . Avoid places or events with large numbers of people in one location, like concerts or sporting events. . Carefully consider travel plans you have or are making. . If you are planning any travel outside or inside the [place], visit the CDC's Travelers' Health webpage for the latest health notices. . If you have some symptoms but not all symptoms, continue to monitor at home and seek medical attention if your symptoms worsen. . If you are having a medical emergency, call 911.   [place] / e-Visit: [email or web address]         [facility]: [facility]: [phone]                   [facility]: [phone]

## 2019-01-12 ENCOUNTER — Encounter: Payer: Self-pay | Admitting: Internal Medicine

## 2019-01-12 ENCOUNTER — Other Ambulatory Visit: Payer: Self-pay

## 2019-01-12 ENCOUNTER — Ambulatory Visit (INDEPENDENT_AMBULATORY_CARE_PROVIDER_SITE_OTHER): Payer: Medicare Other | Admitting: Internal Medicine

## 2019-01-12 DIAGNOSIS — R42 Dizziness and giddiness: Secondary | ICD-10-CM | POA: Insufficient documentation

## 2019-01-12 NOTE — Assessment & Plan Note (Signed)
Vague ear symptoms, vertigo, fatigue, etc I think this is likely all related to the lomotil She will stop this completely If the specific vestibular symptoms persist, would refer to ENT

## 2019-01-12 NOTE — Progress Notes (Signed)
Subjective:    Patient ID: Chelsea Keller, female    DOB: 26-Sep-1934, 84 y.o.   MRN: [id number]  HPI Here with daughter due to trouble with her ears  Feels her ears are very clogged--~1 week Thinks she is talking too loud Hearing is off Some vertigo---with head movement or if she gets up  Feels that her vision is blurry Feels run down No tinnitus  Did have some lomotil for diarrhea associated with the iron May have taken quite a few of these over 2 weeks  Current Outpatient Medications on File Prior to Visit  Medication Sig Dispense Refill  . acetaminophen (TYLENOL) 500 MG tablet Take 1,000-1,500 mg by mouth every 6 (six) hours as needed for moderate pain or headache (pain).     Marland Kitchen aspirin 81 MG tablet Take 81 mg by mouth daily.    . furosemide (LASIX) 80 MG tablet Take 80 mg by mouth daily.     Marland Kitchen gabapentin (NEURONTIN) 300 MG capsule Take 1-2 capsules (300-600 mg total) by mouth 2 (two) times daily. Take 1 capsule in the morning and Take 2 capsules at bedtime 42 capsule 0  . potassium chloride SA (K-DUR,KLOR-CON) 20 MEQ tablet TAKE 2 TABLETS (40 MEQ TOTAL) DAILY 180 tablet 4  . tiZANidine (ZANAFLEX) 2 MG tablet Take 1 tablet (2 mg total) by mouth 3 (three) times daily as needed for muscle spasms. 60 tablet 0  . traMADol (ULTRAM) 50 MG tablet Take 1 tablet (50 mg total) by mouth 3 (three) times daily as needed for moderate pain. 60 tablet 0  . triamterene-hydrochlorothiazide (MAXZIDE-25) 37.5-25 MG tablet Take 1 tablet by mouth daily. 90 tablet 3   No current facility-administered medications on file prior to visit.     Allergies  Allergen Reactions  . Codeine Sulfate Shortness Of Breath  . Celecoxib Other (See Comments)    Caused vaginal bleeding  . Erythromycin Base Nausea And Vomiting    Past Medical History:  Diagnosis Date  . Allergy   . Anemia   . Anxiety   . Arthritis   . Arthritis of sacroiliac joint of both sides 11/12/2017  . Bilateral pulmonary embolism ([place])  06/23/2017  . Chronic diastolic CHF (congestive heart failure) (HCC)    a. Echo 1/16:  mild LVH, EF normal, grade 1 DD, MAC  . Chronic venous insufficiency    chronic LE edema  . DDD (degenerative disc disease), lumbar 11/12/2017  . Degenerative joint disease (DJD) of hip, Bilateral  11/12/2017  . Depression   . Fibromyalgia    constant pain  . Hx of cardiac catheterization    a. LHC in [place] "ok" per patient with mild plaque in a single vessel - records not available  . Hx of cardiovascular stress test    a. Nuclear study in 2008 normal  . Hx of colonic polyps   . Hypertension   . Hypertriglyceridemia   . Impaired fasting glucose   . PONV (postoperative nausea and vomiting)   . PUD (peptic ulcer disease)    hx of gastric ulcer  . Pulmonary emboli ([place]) 06/2017  . Vitamin B12 deficiency     Past Surgical History:  Procedure Laterality Date  . ABDOMINAL HYSTERECTOMY    . CATARACT EXTRACTION  10/2003   OD  . CHOLECYSTECTOMY    . COLONOSCOPY W/ POLYPECTOMY    . COLONOSCOPY WITH PROPOFOL N/A 10/15/2014   Procedure: COLONOSCOPY WITH PROPOFOL;  Surgeon: Ladene Artist, MD;  Location: [facility];  Service: Endoscopy;  Laterality: N/A;  . COLONOSCOPY WITH PROPOFOL N/A 03/23/2018   Procedure: COLONOSCOPY WITH PROPOFOL;  Surgeon: Gatha Mayer, MD;  Location: [facility] ENDOSCOPY;  Service: Endoscopy;  Laterality: N/A;  . ESOPHAGOGASTRODUODENOSCOPY (EGD) WITH PROPOFOL N/A 10/15/2014   Procedure: ESOPHAGOGASTRODUODENOSCOPY (EGD) WITH PROPOFOL;  Surgeon: Ladene Artist, MD;  Location: [facility];  Service: Endoscopy;  Laterality: N/A;  . EXTERNAL FIXATION ANKLE FRACTURE     Fx.  left ankle-fixation with pins later removed sec to infection 1985  . EXTERNAL FIXATION WRIST FRACTURE  1985   left with pins  . EYE SURGERY    . FEMUR FRACTURE SURGERY  06/2009  . FRACTURE SURGERY    . HARDWARE REMOVAL Left 10/05/2013   Procedure: HARDWARE REMOVAL LEFT DISTAL FEMUR;  Surgeon: Rozanna Box, MD;   Location: [place];  Service: Orthopedics;  Laterality: Left;  . HEMIARTHROPLASTY SHOULDER FRACTURE  06/2009  . HOT HEMOSTASIS N/A 03/23/2018   Procedure: HOT HEMOSTASIS (ARGON PLASMA COAGULATION/BICAP);  Surgeon: Gatha Mayer, MD;  Location: [facility]COPY;  Service: Endoscopy;  Laterality: N/A;  . JOINT REPLACEMENT    . STERIOD INJECTION Right 10/05/2013   Procedure: STEROID INJECTION;  Surgeon: Rozanna Box, MD;  Location: [facility];  Service: Orthopedics;  Laterality: Right;  . TONSILLECTOMY    . TONSILLECTOMY    . TOTAL KNEE ARTHROPLASTY  03/09   left    Family History  Problem Relation Age of Onset  . Depression Mother   . Cancer Mother        uterine cancer  . Heart attack Father   . Cancer Brother        prostate cancer  . Diabetes Maternal Aunt   . Arthritis Brother   . Asthma Brother   . Stroke Maternal Grandmother   . Pulmonary embolism Daughter     Social History   Socioeconomic History  . Marital status: Widowed    Spouse name: Not on file  . Number of children: 4  . Years of education: Not on file  . Highest education level: Not on file  Occupational History  . Occupation: retired crossing guard  . 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 Financial resource strain: Not on file  . Food insecurity    Worry: Not on file    Inability: Not on file  . Transportation needs    Medical: Not on file    Non-medical: Not on file  Tobacco Use  . Smoking status: Former Smoker    Packs/day: 1.00    Years: 40.00    Pack years: 40.00    Types: Cigarettes    Quit date: 07/06/1993    Years since quitting: 25.5  . Smokeless tobacco: Never Used  Substance and Sexual Activity  . Alcohol use: No    Alcohol/week: 0.0 standard drinks  . Drug use: No  . Sexual activity: Not on file  Lifestyle  . Physical activity    Days per week: Not on file    Minutes per session: Not on file  . Stress: Not on file  Relationships  . Social Herbalist on  phone: Not on file    Gets together: Not on file    Attends religious service: Not on file    Active member of club or organization: Not on file    Attends meetings of clubs or organizations: Not on file    Relationship status: Not on file  . Intimate partner violence  Fear of current or ex partner: Not on file    Emotionally abused: Not on file    Physically abused: Not on file    Forced sexual activity: Not on file  Other Topics Concern  . 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 No distress.  HENT:  No cerumen TMs appear normal  Cardiovascular: Normal rate, regular rhythm and normal heart sounds. Exam reveals no gallop.  No murmur heard. Respiratory: Effort normal and breath sounds normal. No respiratory distress. She has no wheezes. She has no rales.  Musculoskeletal:        General: No edema.           Assessment & Plan:

## 2019-02-15 MED ORDER — GABAPENTIN 300 MG PO CAPS: ORAL_CAPSULE | ORAL | 11 refills | Status: DC

## 2019-02-15 MED ORDER — GABAPENTIN 300 MG PO CAPS: ORAL_CAPSULE | ORAL | 3 refills | Status: DC

## 2019-02-15 NOTE — Addendum Note (Signed)
Addended by: Pilar Grammes on: 02/15/2019 08:23 AM   Modules accepted: Orders

## 2019-02-17 ENCOUNTER — Other Ambulatory Visit: Payer: Self-pay

## 2019-02-17 ENCOUNTER — Encounter: Payer: Self-pay | Admitting: Internal Medicine

## 2019-02-17 ENCOUNTER — Ambulatory Visit (INDEPENDENT_AMBULATORY_CARE_PROVIDER_SITE_OTHER): Payer: Medicare Other | Admitting: Internal Medicine

## 2019-02-17 DIAGNOSIS — M461 Sacroiliitis, not elsewhere classified: Secondary | ICD-10-CM | POA: Insufficient documentation

## 2019-02-17 MED ORDER — PREDNISONE 20 MG PO TABS: 40.0000 mg | ORAL_TABLET | Freq: Every day | ORAL | 0 refills | Status: DC

## 2019-02-17 NOTE — Assessment & Plan Note (Addendum)
Radiographic diagnosis but never had rheumatologic evaluation Did have improvement after focal cortisone injection Having trouble even moving around Will try burst of prednisone for symptom relief Will refer to rheumatologist  Pain is focus around right hip but moves. Nothing to suggest primary abdominal problem (reviewed CT and colon from last year)

## 2019-02-17 NOTE — Progress Notes (Signed)
Subjective:    Patient ID: Chelsea Keller, female    DOB: [birth year], 84 y.o.   MRN: [id number]  HPI Here due to abdominal pain  Right side abdominal pain Severe at the beginning of the week---seemed to be better yesterday Comes and goes--but worse at night "Weird site"---starts at lateral side of right hip--then up flank Occasionally to middle of stomach or even chest  No apparent injury No dysuria or hematuria Pain is not provoked by any movement  Current Outpatient Medications on File Prior to Visit  Medication Sig Dispense Refill  . acetaminophen (TYLENOL) 500 MG tablet Take 1,000-1,500 mg by mouth every 6 (six) hours as needed for moderate pain or headache (pain).     Marland Kitchen aspirin 81 MG tablet Take 81 mg by mouth daily.    . furosemide (LASIX) 80 MG tablet Take 80 mg by mouth daily.     Marland Kitchen gabapentin (NEURONTIN) 300 MG capsule Take 1 capsule in the morning and 2 capsules at bedtime 270 capsule 3  . potassium chloride SA (K-DUR,KLOR-CON) 20 MEQ tablet TAKE 2 TABLETS (40 MEQ TOTAL) DAILY 180 tablet 4  . tiZANidine (ZANAFLEX) 2 MG tablet Take 1 tablet (2 mg total) by mouth 3 (three) times daily as needed for muscle spasms. 60 tablet 0  . traMADol (ULTRAM) 50 MG tablet Take 1 tablet (50 mg total) by mouth 3 (three) times daily as needed for moderate pain. 60 tablet 0  . triamterene-hydrochlorothiazide (MAXZIDE-25) 37.5-25 MG tablet Take 1 tablet by mouth daily. 90 tablet 3   No current facility-administered medications on file prior to visit.     Allergies  Allergen Reactions  . Codeine Sulfate Shortness Of Breath  . Celecoxib Other (See Comments)    Caused vaginal bleeding  . Erythromycin Base Nausea And Vomiting    Past Medical History:  Diagnosis Date  . Allergy   . Anemia   . Anxiety   . Arthritis   . Arthritis of sacroiliac joint of both sides 11/12/2017  . Bilateral pulmonary embolism ([place]) 06/23/2017  . Chronic diastolic CHF (congestive heart failure) (HCC)    a.  Echo 1/16:  mild LVH, EF normal, grade 1 DD, MAC  . Chronic venous insufficiency    chronic LE edema  . DDD (degenerative disc disease), lumbar 11/12/2017  . Degenerative joint disease (DJD) of hip, Bilateral  11/12/2017  . Depression   . Fibromyalgia    constant pain  . Hx of cardiac catheterization    a. LHC in [place] "ok" per patient with mild plaque in a single vessel - records not available  . Hx of cardiovascular stress test    a. Nuclear study in 2008 normal  . Hx of colonic polyps   . Hypertension   . Hypertriglyceridemia   . Impaired fasting glucose   . PONV (postoperative nausea and vomiting)   . PUD (peptic ulcer disease)    hx of gastric ulcer  . Pulmonary emboli ([place]) 06/2017  . Vitamin B12 deficiency     Past Surgical History:  Procedure Laterality Date  . ABDOMINAL HYSTERECTOMY    . CATARACT EXTRACTION  10/2003   OD  . CHOLECYSTECTOMY    . COLONOSCOPY W/ POLYPECTOMY    . COLONOSCOPY WITH PROPOFOL N/A 10/15/2014   Procedure: COLONOSCOPY WITH PROPOFOL;  Surgeon: Ladene Artist, MD;  [facility];  Service: Endoscopy;  Laterality: N/A;  . COLONOSCOPY WITH PROPOFOL N/A 03/23/2018   Procedure: COLONOSCOPY WITH PROPOFOL;  Surgeon: Gatha Mayer, MD;  Location: [facility];  Service: Endoscopy;  Laterality: N/A;  . ESOPHAGOGASTRODUODENOSCOPY (EGD) WITH PROPOFOL N/A 10/15/2014   Procedure: ESOPHAGOGASTRODUODENOSCOPY (EGD) WITH PROPOFOL;  Surgeon: Ladene Artist, MD;  [facility];  Service: Endoscopy;  Laterality: N/A;  . EXTERNAL FIXATION ANKLE FRACTURE     Fx.  left ankle-fixation with pins later removed sec to infection 1985  . EXTERNAL FIXATION WRIST FRACTURE  1985   left with pins  . EYE SURGERY    . FEMUR FRACTURE SURGERY  06/2009  . FRACTURE SURGERY    . HARDWARE REMOVAL Left 10/05/2013   Procedure: HARDWARE REMOVAL LEFT DISTAL FEMUR;  Surgeon: Rozanna Box, MD;  Location: [place];  Service: Orthopedics;  Laterality: Left;  . HEMIARTHROPLASTY  SHOULDER FRACTURE  06/2009  . HOT HEMOSTASIS N/A 03/23/2018   Procedure: HOT HEMOSTASIS (ARGON PLASMA COAGULATION/BICAP);  Surgeon: Gatha Mayer, MD;  Location: [facility] ENDOSCOPY;  Service: Endoscopy;  Laterality: N/A;  . JOINT REPLACEMENT    . STERIOD INJECTION Right 10/05/2013   Procedure: STEROID INJECTION;  Surgeon: Rozanna Box, MD;  Location: [place];  Service: Orthopedics;  Laterality: Right;  . TONSILLECTOMY    . TONSILLECTOMY    . TOTAL KNEE ARTHROPLASTY  03/09   left    Family History  Problem Relation Age of Onset  . Depression Mother   . Cancer Mother        uterine cancer  . Heart attack Father   . Cancer Brother        prostate cancer  . Diabetes Maternal Aunt   . Arthritis Brother   . Asthma Brother   . Stroke Maternal Grandmother   . Pulmonary embolism Daughter     Social History   Socioeconomic History  . Marital status: Widowed    Spouse name: Not on file  . Number of children: 4  . Years of education: Not on file  . Highest education level: Not on file  Occupational History  . Occupation: retired crossing guard  . 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 Financial resource strain: Not on file  . Food insecurity    Worry: Not on file    Inability: Not on file  . Transportation needs    Medical: Not on file    Non-medical: Not on file  Tobacco Use  . Smoking status: Former Smoker    Packs/day: 1.00    Years: 40.00    Pack years: 40.00    Types: Cigarettes    Quit date: 07/06/1993    Years since quitting: 25.6  . Smokeless tobacco: Never Used  Substance and Sexual Activity  . Alcohol use: No    Alcohol/week: 0.0 standard drinks  . Drug use: No  . Sexual activity: Not on file  Lifestyle  . Physical activity    Days per week: Not on file    Minutes per session: Not on file  . Stress: Not on file  Relationships  . Social Herbalist on phone: Not on file    Gets together: Not on file    Attends religious  service: Not on file    Active member of club or organization: Not on file    Attends meetings of clubs or organizations: Not on file    Relationship status: Not on file  . Intimate partner violence    Fear of current or ex partner: Not on file    Emotionally abused: Not on file  Physically abused: Not on file    Forced sexual activity: Not on file  Other Topics Concern  . 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 No distress.  Respiratory: Effort normal and breath sounds normal. No respiratory distress. She has no wheezes. She has no rales.  GI: She exhibits no distension. There is no rebound and no guarding.  No distinct abdominal tenderness  Musculoskeletal:     Comments: Tenderness in lumbar spine Marked tenderness along lateral right hip, some over bursa and also S-I           Assessment & Plan:

## 2019-03-08 ENCOUNTER — Telehealth: Payer: Self-pay | Admitting: Internal Medicine

## 2019-03-08 NOTE — Telephone Encounter (Signed)
I am not sure why they would not see her I am concerned about sacroiliitis with functional worsening and that is a rheumatologic condition See if there is someone else who will see her

## 2019-03-08 NOTE — Telephone Encounter (Signed)
Received a note through [facility] that [facility] declined this Referral and said that the patient needs to see a back Specialist. Will notify patient that the Referral was declined.

## 2019-03-15 DIAGNOSIS — M797 Fibromyalgia: Secondary | ICD-10-CM | POA: Diagnosis not present

## 2019-03-15 DIAGNOSIS — M542 Cervicalgia: Secondary | ICD-10-CM | POA: Diagnosis not present

## 2019-03-15 DIAGNOSIS — M25561 Pain in right knee: Secondary | ICD-10-CM | POA: Diagnosis not present

## 2019-03-15 DIAGNOSIS — M1711 Unilateral primary osteoarthritis, right knee: Secondary | ICD-10-CM | POA: Diagnosis not present

## 2019-03-15 DIAGNOSIS — M1990 Unspecified osteoarthritis, unspecified site: Secondary | ICD-10-CM | POA: Diagnosis not present

## 2019-03-15 DIAGNOSIS — M19042 Primary osteoarthritis, left hand: Secondary | ICD-10-CM | POA: Diagnosis not present

## 2019-03-15 DIAGNOSIS — M79672 Pain in left foot: Secondary | ICD-10-CM | POA: Diagnosis not present

## 2019-03-15 DIAGNOSIS — M79642 Pain in left hand: Secondary | ICD-10-CM | POA: Diagnosis not present

## 2019-03-15 DIAGNOSIS — M109 Gout, unspecified: Secondary | ICD-10-CM | POA: Diagnosis not present

## 2019-03-15 DIAGNOSIS — M79641 Pain in right hand: Secondary | ICD-10-CM | POA: Diagnosis not present

## 2019-03-15 DIAGNOSIS — M19071 Primary osteoarthritis, right ankle and foot: Secondary | ICD-10-CM | POA: Diagnosis not present

## 2019-03-15 DIAGNOSIS — M549 Dorsalgia, unspecified: Secondary | ICD-10-CM | POA: Diagnosis not present

## 2019-03-15 DIAGNOSIS — M19072 Primary osteoarthritis, left ankle and foot: Secondary | ICD-10-CM | POA: Diagnosis not present

## 2019-03-15 DIAGNOSIS — M19041 Primary osteoarthritis, right hand: Secondary | ICD-10-CM | POA: Diagnosis not present

## 2019-03-15 DIAGNOSIS — M25572 Pain in left ankle and joints of left foot: Secondary | ICD-10-CM | POA: Diagnosis not present

## 2019-03-15 DIAGNOSIS — M25571 Pain in right ankle and joints of right foot: Secondary | ICD-10-CM | POA: Diagnosis not present

## 2019-03-15 DIAGNOSIS — M79671 Pain in right foot: Secondary | ICD-10-CM | POA: Diagnosis not present

## 2019-03-15 DIAGNOSIS — L4050 Arthropathic psoriasis, unspecified: Secondary | ICD-10-CM | POA: Diagnosis not present

## 2019-03-15 DIAGNOSIS — I10 Essential (primary) hypertension: Secondary | ICD-10-CM | POA: Diagnosis not present

## 2019-03-16 ENCOUNTER — Ambulatory Visit (INDEPENDENT_AMBULATORY_CARE_PROVIDER_SITE_OTHER): Payer: Medicare Other | Admitting: Internal Medicine

## 2019-03-16 ENCOUNTER — Other Ambulatory Visit: Payer: Self-pay

## 2019-03-16 ENCOUNTER — Encounter: Payer: Self-pay | Admitting: Internal Medicine

## 2019-03-16 VITALS — BP 122/64 | HR 80 | Temp 98.3°F | Ht 62.0 in | Wt 269.0 lb

## 2019-03-16 DIAGNOSIS — M461 Sacroiliitis, not elsewhere classified: Secondary | ICD-10-CM

## 2019-03-16 DIAGNOSIS — D649 Anemia, unspecified: Secondary | ICD-10-CM

## 2019-03-16 NOTE — Progress Notes (Signed)
Subjective:    Patient ID: Chelsea Keller, female    DOB: 1935/02/07, 84 y.o.   MRN: [id number]  HPI Here with daughter for follow up of pain Did finally see Dr Dossie Der yesterday---lots of x-rays Considering OA, gout, psoriatic arthritis, RA  Felt really good with the prednisone Much less pain then  Has pain in "head" Feels inside--but a lot also in her neck  Current Outpatient Medications on File Prior to Visit  Medication Sig Dispense Refill  . acetaminophen (TYLENOL) 500 MG tablet Take 1,000-1,500 mg by mouth every 6 (six) hours as needed for moderate pain or headache (pain).     Marland Kitchen aspirin 81 MG tablet Take 81 mg by mouth daily.    . furosemide (LASIX) 80 MG tablet Take 80 mg by mouth daily.     Marland Kitchen gabapentin (NEURONTIN) 300 MG capsule Take 1 capsule in the morning and 2 capsules at bedtime 270 capsule 3  . potassium chloride SA (K-DUR,KLOR-CON) 20 MEQ tablet TAKE 2 TABLETS (40 MEQ TOTAL) DAILY 180 tablet 4  . tiZANidine (ZANAFLEX) 2 MG tablet Take 1 tablet (2 mg total) by mouth 3 (three) times daily as needed for muscle spasms. 60 tablet 0  . traMADol (ULTRAM) 50 MG tablet Take 1 tablet (50 mg total) by mouth 3 (three) times daily as needed for moderate pain. 60 tablet 0  . triamterene-hydrochlorothiazide (MAXZIDE-25) 37.5-25 MG tablet Take 1 tablet by mouth daily. 90 tablet 3   No current facility-administered medications on file prior to visit.     Allergies  Allergen Reactions  . Codeine Sulfate Shortness Of Breath  . Celecoxib Other (See Comments)    Caused vaginal bleeding  . Erythromycin Base Nausea And Vomiting    Past Medical History:  Diagnosis Date  . Allergy   . Anemia   . Anxiety   . Arthritis   . Arthritis of sacroiliac joint of both sides 11/12/2017  . Bilateral pulmonary embolism ([place]) 06/23/2017  . Chronic diastolic CHF (congestive heart failure) (HCC)    a. Echo 1/16:  mild LVH, EF normal, grade 1 DD, MAC  . Chronic venous insufficiency    chronic LE  edema  . DDD (degenerative disc disease), lumbar 11/12/2017  . Degenerative joint disease (DJD) of hip, Bilateral  11/12/2017  . Depression   . Fibromyalgia    constant pain  . Hx of cardiac catheterization    a. LHC in [place] "ok" per patient with mild plaque in a single vessel - records not available  . Hx of cardiovascular stress test    a. Nuclear study in 2008 normal  . Hx of colonic polyps   . Hypertension   . Hypertriglyceridemia   . Impaired fasting glucose   . PONV (postoperative nausea and vomiting)   . PUD (peptic ulcer disease)    hx of gastric ulcer  . Pulmonary emboli ([place]) 06/2017  . Vitamin B12 deficiency     Past Surgical History:  Procedure Laterality Date  . ABDOMINAL HYSTERECTOMY    . CATARACT EXTRACTION  10/2003   OD  . CHOLECYSTECTOMY    . COLONOSCOPY W/ POLYPECTOMY    . COLONOSCOPY WITH PROPOFOL N/A 10/15/2014   Procedure: COLONOSCOPY WITH PROPOFOL;  Surgeon: Ladene Artist, MD;  Location: [facility] ENDOSCOPY;  Service: Endoscopy;  Laterality: N/A;  . COLONOSCOPY WITH PROPOFOL N/A 03/23/2018   Procedure: COLONOSCOPY WITH PROPOFOL;  Surgeon: Gatha Mayer, MD;  Location: [facility] ENDOSCOPY;  Service: Endoscopy;  Laterality: N/A;  . ESOPHAGOGASTRODUODENOSCOPY (EGD)  WITH PROPOFOL N/A 10/15/2014   Procedure: ESOPHAGOGASTRODUODENOSCOPY (EGD) WITH PROPOFOL;  Surgeon: Ladene Artist, MD;  Location: [facility]OS[facility];  Service: Endoscopy;  Laterality: N/A;  . EXTERNAL FIXATION ANKLE FRACTURE     Fx.  left ankle-fixation with pins later removed sec to infection 1985  . EXTERNAL FIXATION WRIST FRACTURE  1985   left with pins  . EYE SURGERY    . FEMUR FRACTURE SURGERY  06/2009  . FRACTURE SURGERY    . HARDWARE REMOVAL Left 10/05/2013   Procedure: HARDWARE REMOVAL LEFT DISTAL FEMUR;  Surgeon: Rozanna Box, MD;  Location: [place];  Service: Orthopedics;  Laterality: Left;  . HEMIARTHROPLASTY SHOULDER FRACTURE  06/2009  . HOT HEMOSTASIS N/A 03/23/2018   Procedure: HOT HEMOSTASIS (ARGON  PLASMA COAGULATION/BICAP);  Surgeon: Gatha Mayer, MD;  Location: [facility] ENDOSCOPY;  Service: Endoscopy;  Laterality: N/A;  . JOINT REPLACEMENT    . STERIOD INJECTION Right 10/05/2013   Procedure: STEROID INJECTION;  Surgeon: Rozanna Box, MD;  Location: [place];  Service: Orthopedics;  Laterality: Right;  . TONSILLECTOMY    . TONSILLECTOMY    . TOTAL KNEE ARTHROPLASTY  03/09   left    Family History  Problem Relation Age of Onset  . Depression Mother   . Cancer Mother        uterine cancer  . Heart attack Father   . Cancer Brother        prostate cancer  . Diabetes Maternal Aunt   . Arthritis Brother   . Asthma Brother   . Stroke Maternal Grandmother   . Pulmonary embolism Daughter     Social History   Socioeconomic History  . Marital status: Widowed    Spouse name: Not on file  . Number of children: 4  . Years of education: Not on file  . Highest education level: Not on file  Occupational History  . Occupation: retired crossing guard  . 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 Financial resource strain: Not on file  . Food insecurity    Worry: Not on file    Inability: Not on file  . Transportation needs    Medical: Not on file    Non-medical: Not on file  Tobacco Use  . Smoking status: Former Smoker    Packs/day: 1.00    Years: 40.00    Pack years: 40.00    Types: Cigarettes    Quit date: 07/06/1993    Years since quitting: 25.7  . Smokeless tobacco: Never Used  Substance and Sexual Activity  . Alcohol use: No    Alcohol/week: 0.0 standard drinks  . Drug use: No  . Sexual activity: Not on file  Lifestyle  . Physical activity    Days per week: Not on file    Minutes per session: Not on file  . Stress: Not on file  Relationships  . Social Herbalist on phone: Not on file    Gets together: Not on file    Attends religious service: Not on file    Active member of club or organization: Not on file    Attends meetings of  clubs or organizations: Not on file    Relationship status: Not on file  . Intimate partner violence    Fear of current or ex partner: Not on file    Emotionally abused: Not on file    Physically abused: Not on file    Forced sexual activity: Not  on file  Other Topics Concern  . 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  Neck:  Decreased ROM and tenderness over lower cervical spine  Musculoskeletal:     Comments: Synovial thickening in hands--without active synovitis           Assessment & Plan:

## 2019-03-16 NOTE — Assessment & Plan Note (Signed)
Will recheck level

## 2019-03-16 NOTE — Assessment & Plan Note (Signed)
All her pain really responded to prednisone Just had rheumatologic evaluation---Differential is RA, gout, psoriatic arthritis, OA Will recheck uric acid

## 2019-03-17 LAB — CBC
HCT: 37.4 % (ref 36.0–46.0)
Hemoglobin: 12.3 g/dL (ref 12.0–15.0)
MCHC: 32.7 g/dL (ref 30.0–36.0)
MCV: 89.6 fl (ref 78.0–100.0)
Platelets: 341 10*3/uL (ref 150.0–400.0)
RBC: 4.18 Mil/uL (ref 3.87–5.11)
RDW: 16.5 % — ABNORMAL HIGH (ref 11.5–15.5)
WBC: 8 10*3/uL (ref 4.0–10.5)

## 2019-03-17 LAB — URIC ACID: Uric Acid, Serum: 9.3 mg/dL — ABNORMAL HIGH (ref 2.4–7.0)

## 2019-04-03 ENCOUNTER — Encounter: Payer: Self-pay | Admitting: Hematology and Oncology

## 2019-04-03 NOTE — Telephone Encounter (Signed)
Spoke to daughter  She is concerned about a clot--I am more concerned about infection Will see her at 7:45AM tomorrow Continue the lasix

## 2019-04-04 ENCOUNTER — Other Ambulatory Visit: Payer: Self-pay

## 2019-04-04 ENCOUNTER — Ambulatory Visit (INDEPENDENT_AMBULATORY_CARE_PROVIDER_SITE_OTHER): Payer: Medicare Other | Admitting: Internal Medicine

## 2019-04-04 ENCOUNTER — Encounter: Payer: Self-pay | Admitting: Internal Medicine

## 2019-04-04 DIAGNOSIS — I872 Venous insufficiency (chronic) (peripheral): Secondary | ICD-10-CM | POA: Diagnosis not present

## 2019-04-04 MED ORDER — TRIAMTERENE-HCTZ 37.5-25 MG PO TABS: 1.0000 | ORAL_TABLET | Freq: Every day | ORAL | 3 refills | Status: DC

## 2019-04-04 NOTE — Assessment & Plan Note (Signed)
Was more severe and suggestive of infection ---till restarting the furosemide over the past 2 days Now only looks like stasis dermatitis--no infection No findings of concern for DVT at this point She vows to be more regular with the furosemide If the medial left ankle worsens, would try empiric antibiotic

## 2019-04-04 NOTE — Progress Notes (Signed)
Subjective:    Patient ID: Chelsea Keller, female    DOB: January 06, 1935, 84 y.o.   MRN: [id number]  HPI Here with daughter due to increased right foot swelling Yesterday got hot and red--better this morning This is day 4 of the increase swelling---usually comes and goes Did restart the furosemide 2 days ago  No knee or thigh pain--but did have "distortion" of right upper calf a couple of days ago  Current Outpatient Medications on File Prior to Visit  Medication Sig Dispense Refill  . acetaminophen (TYLENOL) 500 MG tablet Take 1,000-1,500 mg by mouth every 6 (six) hours as needed for moderate pain or headache (pain).     Marland Kitchen aspirin 81 MG tablet Take 81 mg by mouth daily.    . furosemide (LASIX) 80 MG tablet Take 80 mg by mouth daily.     Marland Kitchen gabapentin (NEURONTIN) 300 MG capsule Take 1 capsule in the morning and 2 capsules at bedtime 270 capsule 3  . potassium chloride SA (K-DUR,KLOR-CON) 20 MEQ tablet TAKE 2 TABLETS (40 MEQ TOTAL) DAILY 180 tablet 4  . tiZANidine (ZANAFLEX) 2 MG tablet Take 1 tablet (2 mg total) by mouth 3 (three) times daily as needed for muscle spasms. 60 tablet 0  . traMADol (ULTRAM) 50 MG tablet Take 1 tablet (50 mg total) by mouth 3 (three) times daily as needed for moderate pain. 60 tablet 0  . triamterene-hydrochlorothiazide (MAXZIDE-25) 37.5-25 MG tablet Take 1 tablet by mouth daily. 90 tablet 3   No current facility-administered medications on file prior to visit.     Allergies  Allergen Reactions  . Codeine Sulfate Shortness Of Breath  . Celecoxib Other (See Comments)    Caused vaginal bleeding  . Erythromycin Base Nausea And Vomiting    Past Medical History:  Diagnosis Date  . Allergy   . Anemia   . Anxiety   . Arthritis   . Arthritis of sacroiliac joint of both sides 11/12/2017  . Bilateral pulmonary embolism ([place]) 06/23/2017  . Chronic diastolic CHF (congestive heart failure) (HCC)    a. Echo 1/16:  mild LVH, EF normal, grade 1 DD, MAC  . Chronic  venous insufficiency    chronic LE edema  . DDD (degenerative disc disease), lumbar 11/12/2017  . Degenerative joint disease (DJD) of hip, Bilateral  11/12/2017  . Depression   . Fibromyalgia    constant pain  . Hx of cardiac catheterization    a. LHC in [place] "ok" per patient with mild plaque in a single vessel - records not available  . Hx of cardiovascular stress test    a. Nuclear study in 2008 normal  . Hx of colonic polyps   . Hypertension   . Hypertriglyceridemia   . Impaired fasting glucose   . PONV (postoperative nausea and vomiting)   . PUD (peptic ulcer disease)    hx of gastric ulcer  . Pulmonary emboli ([place]) 06/2017  . Vitamin B12 deficiency     Past Surgical History:  Procedure Laterality Date  . ABDOMINAL HYSTERECTOMY    . CATARACT EXTRACTION  10/2003   OD  . CHOLECYSTECTOMY    . COLONOSCOPY W/ POLYPECTOMY    . COLONOSCOPY WITH PROPOFOL N/A 10/15/2014   Procedure: COLONOSCOPY WITH PROPOFOL;  Surgeon: Ladene Artist, MD;  Location: [facility];  Service: Endoscopy;  Laterality: N/A;  . COLONOSCOPY WITH PROPOFOL N/A 03/23/2018   Procedure: COLONOSCOPY WITH PROPOFOL;  Surgeon: Gatha Mayer, MD;  Location: [facility] ENDOSCOPY;  Service: Endoscopy;  Laterality: N/A;  . ESOPHAGOGASTRODUODENOSCOPY (EGD) WITH PROPOFOL N/A 10/15/2014   Procedure: ESOPHAGOGASTRODUODENOSCOPY (EGD) WITH PROPOFOL;  Surgeon: Ladene Artist, MD;  Location: [facility] ENDOSCOPY;  Service: Endoscopy;  Laterality: N/A;  . EXTERNAL FIXATION ANKLE FRACTURE     Fx.  left ankle-fixation with pins later removed sec to infection 1985  . EXTERNAL FIXATION WRIST FRACTURE  1985   left with pins  . EYE SURGERY    . FEMUR FRACTURE SURGERY  06/2009  . FRACTURE SURGERY    . HARDWARE REMOVAL Left 10/05/2013   Procedure: HARDWARE REMOVAL LEFT DISTAL FEMUR;  Surgeon: Rozanna Box, MD;  Location: [place];  Service: Orthopedics;  Laterality: Left;  . HEMIARTHROPLASTY SHOULDER FRACTURE  06/2009  . HOT HEMOSTASIS N/A 03/23/2018    Procedure: HOT HEMOSTASIS (ARGON PLASMA COAGULATION/BICAP);  Surgeon: Gatha Mayer, MD;  Location: [facility] ENDOSCOPY;  Service: Endoscopy;  Laterality: N/A;  . JOINT REPLACEMENT    . STERIOD INJECTION Right 10/05/2013   Procedure: STEROID INJECTION;  Surgeon: Rozanna Box, MD;  Location: [place];  Service: Orthopedics;  Laterality: Right;  . TONSILLECTOMY    . TONSILLECTOMY    . TOTAL KNEE ARTHROPLASTY  03/09   left    Family History  Problem Relation Age of Onset  . Depression Mother   . Cancer Mother        uterine cancer  . Heart attack Father   . Cancer Brother        prostate cancer  . Diabetes Maternal Aunt   . Arthritis Brother   . Asthma Brother   . Stroke Maternal Grandmother   . Pulmonary embolism Daughter     Social History   Socioeconomic History  . Marital status: Widowed    Spouse name: Not on file  . Number of children: 4  . Years of education: Not on file  . Highest education level: Not on file  Occupational History  . Occupation: retired crossing guard  . 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 Financial resource strain: Not on file  . Food insecurity    Worry: Not on file    Inability: Not on file  . Transportation needs    Medical: Not on file    Non-medical: Not on file  Tobacco Use  . Smoking status: Former Smoker    Packs/day: 1.00    Years: 40.00    Pack years: 40.00    Types: Cigarettes    Quit date: 07/06/1993    Years since quitting: 25.7  . Smokeless tobacco: Never Used  Substance and Sexual Activity  . Alcohol use: No    Alcohol/week: 0.0 standard drinks  . Drug use: No  . Sexual activity: Not on file  Lifestyle  . Physical activity    Days per week: Not on file    Minutes per session: Not on file  . Stress: Not on file  Relationships  . Social Herbalist on phone: Not on file    Gets together: Not on file    Attends religious service: Not on file    Active member of club or organization: Not  on file    Attends meetings of clubs or organizations: Not on file    Relationship status: Not on file  . Intimate partner violence    Fear of current or ex partner: Not on file    Emotionally abused: Not on file    Physically abused: Not on file  Forced sexual activity: Not on file  Other Topics Concern  . 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 and adult grandsons   Former smoker, no EtOH      No living will   Plans to do health care POA forms---wants daughter [name]   Would accept resuscitation attempts---no prolonged ventilation   Absolutely no feeding tube   Review of Systems No change in usual breathing Has had some heartburn--especially after manicotti last night Hasn't gotten contacted by Dr Dossie Der yet--they have to sign in to the account No known fever Does get exhausted feeling at times---needs to take a deep breath    Objective:   Physical Exam  Cardiovascular:  Faint pedal pulses  Musculoskeletal:     Comments: 2+ edema in feet--right more than left Stasis changes (light reddish brown) bilaterally--also more on right Slight tenderness over right medial malleolus but no true heat or color change No ulcers No upper calf or thigh tenderness           Assessment & Plan:

## 2019-04-04 NOTE — Patient Instructions (Signed)
Look for a 20% urea cream to help the scaling in your legs.

## 2019-04-05 ENCOUNTER — Other Ambulatory Visit: Payer: Self-pay

## 2019-04-05 ENCOUNTER — Inpatient Hospital Stay: Payer: Medicare Other | Attending: Hematology and Oncology

## 2019-04-05 DIAGNOSIS — D500 Iron deficiency anemia secondary to blood loss (chronic): Secondary | ICD-10-CM | POA: Diagnosis not present

## 2019-04-05 LAB — CBC WITH DIFFERENTIAL (CANCER CENTER ONLY)
Abs Immature Granulocytes: 0.06 10*3/uL (ref 0.00–0.07)
Basophils Absolute: 0.1 10*3/uL (ref 0.0–0.1)
Basophils Relative: 1 %
Eosinophils Absolute: 0.5 10*3/uL (ref 0.0–0.5)
Eosinophils Relative: 4 %
HCT: 38.6 % (ref 36.0–46.0)
Hemoglobin: 12.2 g/dL (ref 12.0–15.0)
Immature Granulocytes: 1 %
Lymphocytes Relative: 17 %
Lymphs Abs: 1.9 10*3/uL (ref 0.7–4.0)
MCH: 29 pg (ref 26.0–34.0)
MCHC: 31.6 g/dL (ref 30.0–36.0)
MCV: 91.7 fL (ref 80.0–100.0)
Monocytes Absolute: 0.9 10*3/uL (ref 0.1–1.0)
Monocytes Relative: 8 %
Neutro Abs: 8.1 10*3/uL — ABNORMAL HIGH (ref 1.7–7.7)
Neutrophils Relative %: 69 %
Platelet Count: 348 10*3/uL (ref 150–400)
RBC: 4.21 MIL/uL (ref 3.87–5.11)
RDW: 15.1 % (ref 11.5–15.5)
WBC Count: 11.5 10*3/uL — ABNORMAL HIGH (ref 4.0–10.5)
nRBC: 0 % (ref 0.0–0.2)

## 2019-04-05 LAB — FERRITIN: Ferritin: 40 ng/mL (ref 11–307)

## 2019-04-05 LAB — IRON AND TIBC
Iron: 43 ug/dL (ref 41–142)
Saturation Ratios: 13 % — ABNORMAL LOW (ref 21–57)
TIBC: 330 ug/dL (ref 236–444)
UIBC: 287 ug/dL (ref 120–384)

## 2019-04-06 ENCOUNTER — Inpatient Hospital Stay: Payer: Medicare Other

## 2019-04-09 NOTE — Progress Notes (Signed)
  HEMATOLOGY-ONCOLOGY TELEPHONE VISIT PROGRESS NOTE  I connected with Chelsea Keller on 04/10/2019 at  9:15 AM EDT by telephone and verified that I am speaking with the correct person using two identifiers.  I discussed the limitations, risks, security and privacy concerns of performing an evaluation and management service by telephone and the availability of in person appointments.  I also discussed with the patient that there may be a patient responsible charge related to this service. The patient expressed understanding and agreed to proceed.   History of Present Illness: Chelsea Keller is a 84 y.o. female with above-mentioned history of iron deficiency anemia previously treated with IV iron. Her most recent labs from 12/19/18 showed: WBC 11.5, ANC 8.1, Hg 12.2, HCT 38.6, iron saturation 13%, ferritin 40. She is over the phone today to review her labs.   Observations/Objective:     Assessment Plan:  Iron deficiency anemia due to chronic blood loss Hospitalization 08/11/2018: Severe GI bleed hemoglobin 4.9(internal hemorrhoids and angiodysplasia of the colon)Received blood transfusions 08/22/2018 hemoglobin 9.3(patient's baseline is 11 to 12 g on 04/20/2018) Ferritin while in hospital: 2(10 months ago it was 6 and 4 years ago it was 8)  IV iron treatment: September 2019, March 2020, Injectafer use June 2020  Lab review: 04/10/2019: Hemoglobin 12.2, ferritin 40  IV iron infusion related toxicities: Patient gets profound diarrhea which last for 2 to 3 days after Feraheme (and prescription for Lomotil for 3 days). With Injectafer, she did much better. Diarrhea was also better with this.  She just did not like back to back on infusions.  If next time we need to give her iron we may give her every other week.  Return to clinic in 3 months with labs done ahead of time and follow-up.  I discussed the assessment and treatment plan with the patient. The patient was provided an opportunity to ask  questions and all were answered. The patient agreed with the plan and demonstrated an understanding of the instructions. The patient was advised to call back or seek an in-person evaluation if the symptoms worsen or if the condition fails to improve as anticipated.   I provided 15 minutes of non-face-to-face time during this encounter.   Rulon Eisenmenger, MD 04/10/2019    I, Molly Dorshimer, am acting as scribe for Nicholas Lose, MD.  I have reviewed the above documentation for accuracy and completeness, and I agree with the above.

## 2019-04-10 ENCOUNTER — Inpatient Hospital Stay: Payer: Medicare Other | Attending: Hematology and Oncology | Admitting: Hematology and Oncology

## 2019-04-10 ENCOUNTER — Other Ambulatory Visit: Payer: Self-pay | Admitting: Hematology and Oncology

## 2019-04-10 DIAGNOSIS — D500 Iron deficiency anemia secondary to blood loss (chronic): Secondary | ICD-10-CM

## 2019-04-10 NOTE — Assessment & Plan Note (Signed)
Hospitalization 08/11/2018: Severe GI bleed hemoglobin 4.9(internal hemorrhoids and angiodysplasia of the colon)Received blood transfusions 08/22/2018 hemoglobin 9.3(patient's baseline is 11 to 12 g on 04/20/2018) Ferritin while in hospital: 2(10 months ago it was 6 and 4 years ago it was 8)  IV iron treatment: September 2019, March 2020, Injectafer use June 2020  Lab review: 04/10/2019:   IV iron infusion related toxicities: Patient gets profound diarrhea which last for 2 to 3 days after Feraheme (and prescription for Lomotil for 3 days).

## 2019-04-12 ENCOUNTER — Telehealth: Payer: Self-pay | Admitting: Hematology and Oncology

## 2019-04-12 NOTE — Telephone Encounter (Signed)
I could not reach patient regarding schedule  °

## 2019-04-25 DIAGNOSIS — M1990 Unspecified osteoarthritis, unspecified site: Secondary | ICD-10-CM | POA: Diagnosis not present

## 2019-04-25 DIAGNOSIS — M109 Gout, unspecified: Secondary | ICD-10-CM | POA: Diagnosis not present

## 2019-04-25 DIAGNOSIS — M797 Fibromyalgia: Secondary | ICD-10-CM | POA: Diagnosis not present

## 2019-04-25 DIAGNOSIS — I10 Essential (primary) hypertension: Secondary | ICD-10-CM | POA: Diagnosis not present

## 2019-04-25 DIAGNOSIS — L4050 Arthropathic psoriasis, unspecified: Secondary | ICD-10-CM | POA: Diagnosis not present

## 2019-05-08 ENCOUNTER — Other Ambulatory Visit: Payer: Self-pay | Admitting: Orthopedic Surgery

## 2019-05-08 DIAGNOSIS — M1612 Unilateral primary osteoarthritis, left hip: Secondary | ICD-10-CM

## 2019-05-08 DIAGNOSIS — M1611 Unilateral primary osteoarthritis, right hip: Secondary | ICD-10-CM

## 2019-05-10 ENCOUNTER — Other Ambulatory Visit: Payer: Self-pay

## 2019-05-10 ENCOUNTER — Ambulatory Visit
Admission: RE | Admit: 2019-05-10 | Discharge: 2019-05-10 | Disposition: A | Discharge status: Home / Self Care | Payer: Medicare Other | Source: Ambulatory Visit | Attending: Orthopedic Surgery | Admitting: Orthopedic Surgery

## 2019-05-10 DIAGNOSIS — M1611 Unilateral primary osteoarthritis, right hip: Secondary | ICD-10-CM | POA: Diagnosis not present

## 2019-05-10 MED ORDER — IOPAMIDOL (ISOVUE-M 200) INJECTION 41%
1.0000 mL | Freq: Once | INTRAMUSCULAR | Status: AC
  Administered 2019-05-10: 12:00:00 1 mL via INTRA_ARTICULAR

## 2019-05-10 MED ORDER — METHYLPREDNISOLONE ACETATE 40 MG/ML INJ SUSP (RADIOLOG
120.0000 mg | Freq: Once | INTRAMUSCULAR | Status: AC
  Administered 2019-05-10: 120 mg via INTRA_ARTICULAR

## 2019-05-22 ENCOUNTER — Encounter: Payer: Self-pay | Admitting: Internal Medicine

## 2019-05-22 ENCOUNTER — Ambulatory Visit (INDEPENDENT_AMBULATORY_CARE_PROVIDER_SITE_OTHER): Payer: Medicare Other | Admitting: Internal Medicine

## 2019-05-22 ENCOUNTER — Other Ambulatory Visit: Payer: Self-pay

## 2019-05-22 DIAGNOSIS — S161XXS Strain of muscle, fascia and tendon at neck level, sequela: Secondary | ICD-10-CM

## 2019-05-22 DIAGNOSIS — L03115 Cellulitis of right lower limb: Secondary | ICD-10-CM

## 2019-05-22 DIAGNOSIS — L0390 Cellulitis, unspecified: Secondary | ICD-10-CM | POA: Insufficient documentation

## 2019-05-22 MED ORDER — PREDNISONE 20 MG PO TABS: 40.0000 mg | ORAL_TABLET | Freq: Every day | ORAL | 0 refills | Status: DC

## 2019-05-22 MED ORDER — SILVER SULFADIAZINE 1 % EX CREA: 1.0000 "application " | TOPICAL_CREAM | Freq: Every day | CUTANEOUS | 0 refills | Status: DC

## 2019-05-22 MED ORDER — AMOXICILLIN-POT CLAVULANATE 875-125 MG PO TABS: 1.0000 | ORAL_TABLET | Freq: Two times a day (BID) | ORAL | 0 refills | Status: DC

## 2019-05-22 NOTE — Assessment & Plan Note (Signed)
Pain has recurred Prednisone helped before---will try 3 days again

## 2019-05-22 NOTE — Progress Notes (Signed)
Subjective:    Patient ID: Chelsea Keller, female    DOB: [birth year], 84 y.o.   MRN: [id number]  HPI Here due to a lump on the back of her right leg With daughter  Posterior right calf--near proximal side of Achilles Noticed it a couple of days ago Looks red and "stings" Seemed to have a welt around it--"like a giant blister"  No Rx  Current Outpatient Medications on File Prior to Visit  Medication Sig Dispense Refill  . acetaminophen (TYLENOL) 500 MG tablet Take 1,000-1,500 mg by mouth every 6 (six) hours as needed for moderate pain or headache (pain).     Marland Kitchen aspirin 81 MG tablet Take 81 mg by mouth daily.    . furosemide (LASIX) 80 MG tablet Take 80 mg by mouth daily.     Marland Kitchen gabapentin (NEURONTIN) 300 MG capsule Take 1 capsule in the morning and 2 capsules at bedtime 270 capsule 3  . potassium chloride SA (K-DUR,KLOR-CON) 20 MEQ tablet TAKE 2 TABLETS (40 MEQ TOTAL) DAILY 180 tablet 4  . tiZANidine (ZANAFLEX) 2 MG tablet Take 1 tablet (2 mg total) by mouth 3 (three) times daily as needed for muscle spasms. 60 tablet 0  . traMADol (ULTRAM) 50 MG tablet Take 1 tablet (50 mg total) by mouth 3 (three) times daily as needed for moderate pain. 60 tablet 0  . triamterene-hydrochlorothiazide (MAXZIDE-25) 37.5-25 MG tablet Take 1 tablet by mouth daily. 90 tablet 3   No current facility-administered medications on file prior to visit.     Allergies  Allergen Reactions  . Codeine Sulfate Shortness Of Breath  . Celecoxib Other (See Comments)    Caused vaginal bleeding  . Erythromycin Base Nausea And Vomiting    Past Medical History:  Diagnosis Date  . Allergy   . Anemia   . Anxiety   . Arthritis   . Arthritis of sacroiliac joint of both sides 11/12/2017  . Bilateral pulmonary embolism ([facility]) 06/23/2017  . Chronic diastolic CHF (congestive heart failure) (HCC)    a. Echo 1/16:  mild LVH, EF normal, grade 1 DD, MAC  . Chronic venous insufficiency    chronic LE edema  . DDD  (degenerative disc disease), lumbar 11/12/2017  . Degenerative joint disease (DJD) of hip, Bilateral  11/12/2017  . Depression   . Fibromyalgia    constant pain  . Hx of cardiac catheterization    a. [facility] in [place] "ok" per patient with mild plaque in a single vessel - records not available  . Hx of cardiovascular stress test    a. Nuclear study in 2008 normal  . Hx of colonic polyps   . Hypertension   . Hypertriglyceridemia   . Impaired fasting glucose   . PONV (postoperative nausea and vomiting)   . PUD (peptic ulcer disease)    hx of gastric ulcer  . Pulmonary emboli ([facility]) 06/2017  . Vitamin B12 deficiency     Past Surgical History:  Procedure Laterality Date  . ABDOMINAL HYSTERECTOMY    . CATARACT EXTRACTION  10/2003   OD  . CHOLECYSTECTOMY    . COLONOSCOPY W/ POLYPECTOMY    . COLONOSCOPY WITH PROPOFOL N/A 10/15/2014   Procedure: COLONOSCOPY WITH PROPOFOL;  Surgeon: Ladene Artist, MD;  Location: [facility] ENDOSCOPY;  Service: Endoscopy;  Laterality: N/A;  . COLONOSCOPY WITH PROPOFOL N/A 03/23/2018   Procedure: COLONOSCOPY WITH PROPOFOL;  Surgeon: Gatha Mayer, MD;  Location: [facility]COPY;  Service: Endoscopy;  Laterality: N/A;  . ESOPHAGOGASTRODUODENOSCOPY (EGD)  WITH PROPOFOL N/A 10/15/2014   Procedure: ESOPHAGOGASTRODUODENOSCOPY (EGD) WITH PROPOFOL;  Surgeon: Ladene Artist, MD;  Location: [facility] ENDOSCOPY;  Service: Endoscopy;  Laterality: N/A;  . EXTERNAL FIXATION ANKLE FRACTURE     Fx.  left ankle-fixation with pins later removed sec to infection 1985  . EXTERNAL FIXATION WRIST FRACTURE  1985   left with pins  . EYE SURGERY    . FEMUR FRACTURE SURGERY  06/2009  . FRACTURE SURGERY    . HARDWARE REMOVAL Left 10/05/2013   Procedure: HARDWARE REMOVAL LEFT DISTAL FEMUR;  Surgeon: Rozanna Box, MD;  Location: [facility];  Service: Orthopedics;  Laterality: Left;  . HEMIARTHROPLASTY SHOULDER FRACTURE  06/2009  . HOT HEMOSTASIS N/A 03/23/2018   Procedure: HOT HEMOSTASIS (ARGON PLASMA  COAGULATION/BICAP);  Surgeon: Gatha Mayer, MD;  Location: [facility] ENDOSCOPY;  Service: Endoscopy;  Laterality: N/A;  . JOINT REPLACEMENT    . STERIOD INJECTION Right 10/05/2013   Procedure: STEROID INJECTION;  Surgeon: Rozanna Box, MD;  Location: [facility];  Service: Orthopedics;  Laterality: Right;  . TONSILLECTOMY    . TONSILLECTOMY    . TOTAL KNEE ARTHROPLASTY  03/09   left    Family History  Problem Relation Age of Onset  . Depression Mother   . Cancer Mother        uterine cancer  . Heart attack Father   . Cancer Brother        prostate cancer  . Diabetes Maternal Aunt   . Arthritis Brother   . Asthma Brother   . Stroke Maternal Grandmother   . Pulmonary embolism Daughter     Social History   Socioeconomic History  . Marital status: Widowed    Spouse name: Not on file  . Number of children: 4  . Years of education: Not on file  . Highest education level: Not on file  Occupational History  . Occupation: retired crossing guard  . 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 Financial resource strain: Not on file  . Food insecurity    Worry: Not on file    Inability: Not on file  . Transportation needs    Medical: Not on file    Non-medical: Not on file  Tobacco Use  . Smoking status: Former Smoker    Packs/day: 1.00    Years: 40.00    Pack years: 40.00    Types: Cigarettes    Quit date: 07/06/1993    Years since quitting: 25.8  . Smokeless tobacco: Never Used  Substance and Sexual Activity  . Alcohol use: No    Alcohol/week: 0.0 standard drinks  . Drug use: No  . Sexual activity: Not on file  Lifestyle  . Physical activity    Days per week: Not on file    Minutes per session: Not on file  . Stress: Not on file  Relationships  . Social Herbalist on phone: Not on file    Gets together: Not on file    Attends religious service: Not on file    Active member of club or organization: Not on file    Attends meetings of clubs or  organizations: Not on file    Relationship status: Not on file  . Intimate partner violence    Fear of current or ex partner: Not on file    Emotionally abused: Not on file    Physically abused: Not on file    Forced sexual activity: Not  on file  Other Topics Concern  . 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  Skin:  ~2cm very thin walled pustule----easily opened and serous drainage (posterior lateral right calf just above ankle) Red base and some surrounding redness Warm and tender Small whitish abnormal area in the center           Assessment & Plan:

## 2019-05-22 NOTE — Assessment & Plan Note (Signed)
With now ruptured pustule Will use silvadene topically and augmentin x 5 days Dermatologist if that center area doesn't resolve with Rx

## 2019-05-24 ENCOUNTER — Other Ambulatory Visit: Payer: Self-pay

## 2019-05-24 ENCOUNTER — Ambulatory Visit
Admission: RE | Admit: 2019-05-24 | Discharge: 2019-05-24 | Disposition: A | Discharge status: Home / Self Care | Payer: Medicare Other | Source: Ambulatory Visit | Attending: Orthopedic Surgery | Admitting: Orthopedic Surgery

## 2019-05-24 DIAGNOSIS — M25552 Pain in left hip: Secondary | ICD-10-CM | POA: Diagnosis not present

## 2019-05-24 DIAGNOSIS — M1612 Unilateral primary osteoarthritis, left hip: Secondary | ICD-10-CM

## 2019-05-31 MED ORDER — PREDNISONE 20 MG PO TABS: 40.0000 mg | ORAL_TABLET | Freq: Every day | ORAL | 0 refills | Status: DC

## 2019-05-31 NOTE — Telephone Encounter (Signed)
FYI Pt with neck pain  Sent prednisone again

## 2019-05-31 NOTE — Telephone Encounter (Signed)
Dr Alla German note indicated he would want to refer to derm if not improving   Note was unclear as to whether he was treating anything besides cellulitis ?   It looks like she got augmentin also , correct?   Can she tell you what he thought it was?

## 2019-05-31 NOTE — Telephone Encounter (Signed)
He gave her prednisone for her neck pain. It helped really well for a few days after but it is starting to stiffen up again due to arthritis. She asked if it is after 5pm today to send it on MyChart

## 2019-06-02 NOTE — Telephone Encounter (Signed)
Please check on her on Monday 

## 2019-06-05 NOTE — Telephone Encounter (Signed)
Spoke to pt's daughter. She said the prednisone did help. She is asking what to do since this is an ongoing intermittent issue. She will be traveling a lot more in the next few months because her daughter in [place] was just diagnoses with cancer. She and [name] will be going up there often to help out on the weekends or as needed.

## 2019-06-08 MED ORDER — GABAPENTIN 300 MG PO CAPS: ORAL_CAPSULE | ORAL | 0 refills | Status: DC

## 2019-06-08 NOTE — Addendum Note (Signed)
Addended by: Pilar Grammes on: 06/08/2019 01:44 PM   Modules accepted: Orders

## 2019-06-17 ENCOUNTER — Emergency Department (HOSPITAL_COMMUNITY): Payer: Medicare Other

## 2019-06-17 ENCOUNTER — Encounter (HOSPITAL_COMMUNITY): Payer: Self-pay | Admitting: Emergency Medicine

## 2019-06-17 ENCOUNTER — Inpatient Hospital Stay (HOSPITAL_COMMUNITY)
Admission: EM | Admit: 2019-06-17 | Discharge: 2019-06-21 | DRG: 871 | Disposition: A | Discharge status: Home Health | Payer: Medicare Other | Attending: Internal Medicine | Admitting: Internal Medicine

## 2019-06-17 ENCOUNTER — Other Ambulatory Visit: Payer: Self-pay

## 2019-06-17 DIAGNOSIS — I10 Essential (primary) hypertension: Secondary | ICD-10-CM | POA: Diagnosis present

## 2019-06-17 DIAGNOSIS — Z86010 Personal history of colonic polyps: Secondary | ICD-10-CM

## 2019-06-17 DIAGNOSIS — N183 Chronic kidney disease, stage 3 unspecified: Secondary | ICD-10-CM | POA: Diagnosis present

## 2019-06-17 DIAGNOSIS — Z87891 Personal history of nicotine dependence: Secondary | ICD-10-CM

## 2019-06-17 DIAGNOSIS — N179 Acute kidney failure, unspecified: Secondary | ICD-10-CM | POA: Diagnosis present

## 2019-06-17 DIAGNOSIS — Z86711 Personal history of pulmonary embolism: Secondary | ICD-10-CM

## 2019-06-17 DIAGNOSIS — Z823 Family history of stroke: Secondary | ICD-10-CM

## 2019-06-17 DIAGNOSIS — D631 Anemia in chronic kidney disease: Secondary | ICD-10-CM | POA: Diagnosis present

## 2019-06-17 DIAGNOSIS — I130 Hypertensive heart and chronic kidney disease with heart failure and stage 1 through stage 4 chronic kidney disease, or unspecified chronic kidney disease: Secondary | ICD-10-CM | POA: Diagnosis present

## 2019-06-17 DIAGNOSIS — R6510 Systemic inflammatory response syndrome (SIRS) of non-infectious origin without acute organ dysfunction: Secondary | ICD-10-CM | POA: Diagnosis present

## 2019-06-17 DIAGNOSIS — N1831 Chronic kidney disease, stage 3a: Secondary | ICD-10-CM | POA: Diagnosis present

## 2019-06-17 DIAGNOSIS — Z8261 Family history of arthritis: Secondary | ICD-10-CM

## 2019-06-17 DIAGNOSIS — L03115 Cellulitis of right lower limb: Secondary | ICD-10-CM | POA: Diagnosis present

## 2019-06-17 DIAGNOSIS — J9601 Acute respiratory failure with hypoxia: Secondary | ICD-10-CM | POA: Diagnosis not present

## 2019-06-17 DIAGNOSIS — I2699 Other pulmonary embolism without acute cor pulmonale: Secondary | ICD-10-CM | POA: Diagnosis present

## 2019-06-17 DIAGNOSIS — I2690 Septic pulmonary embolism without acute cor pulmonale: Secondary | ICD-10-CM | POA: Diagnosis present

## 2019-06-17 DIAGNOSIS — Z881 Allergy status to other antibiotic agents status: Secondary | ICD-10-CM

## 2019-06-17 DIAGNOSIS — Z20822 Contact with and (suspected) exposure to covid-19: Secondary | ICD-10-CM | POA: Diagnosis present

## 2019-06-17 DIAGNOSIS — Z20828 Contact with and (suspected) exposure to other viral communicable diseases: Secondary | ICD-10-CM | POA: Diagnosis not present

## 2019-06-17 DIAGNOSIS — A408 Other streptococcal sepsis: Secondary | ICD-10-CM | POA: Diagnosis not present

## 2019-06-17 DIAGNOSIS — E876 Hypokalemia: Secondary | ICD-10-CM | POA: Diagnosis present

## 2019-06-17 DIAGNOSIS — I5032 Chronic diastolic (congestive) heart failure: Secondary | ICD-10-CM | POA: Diagnosis present

## 2019-06-17 DIAGNOSIS — Z8049 Family history of malignant neoplasm of other genital organs: Secondary | ICD-10-CM

## 2019-06-17 DIAGNOSIS — Z833 Family history of diabetes mellitus: Secondary | ICD-10-CM

## 2019-06-17 DIAGNOSIS — Z79899 Other long term (current) drug therapy: Secondary | ICD-10-CM

## 2019-06-17 DIAGNOSIS — R0602 Shortness of breath: Secondary | ICD-10-CM

## 2019-06-17 DIAGNOSIS — Z90710 Acquired absence of both cervix and uterus: Secondary | ICD-10-CM

## 2019-06-17 DIAGNOSIS — Z825 Family history of asthma and other chronic lower respiratory diseases: Secondary | ICD-10-CM

## 2019-06-17 DIAGNOSIS — Z6841 Body Mass Index (BMI) 40.0 and over, adult: Secondary | ICD-10-CM

## 2019-06-17 DIAGNOSIS — Z8249 Family history of ischemic heart disease and other diseases of the circulatory system: Secondary | ICD-10-CM

## 2019-06-17 DIAGNOSIS — Z885 Allergy status to narcotic agent status: Secondary | ICD-10-CM

## 2019-06-17 DIAGNOSIS — Z7982 Long term (current) use of aspirin: Secondary | ICD-10-CM

## 2019-06-17 DIAGNOSIS — Z8719 Personal history of other diseases of the digestive system: Secondary | ICD-10-CM

## 2019-06-17 MED ORDER — HYDROCOD POLST-CPM POLST ER 10-8 MG/5ML PO SUER: 5.0000 mL | Freq: Once | ORAL | Status: DC

## 2019-06-17 MED ORDER — KETOROLAC TROMETHAMINE 30 MG/ML IJ SOLN
15.0000 mg | Freq: Once | INTRAMUSCULAR | Status: AC
  Administered 2019-06-18: 15 mg via INTRAVENOUS
  Filled 2019-06-17: qty 1

## 2019-06-17 MED ORDER — ACETAMINOPHEN 500 MG PO TABS
1000.0000 mg | ORAL_TABLET | Freq: Once | ORAL | Status: AC
  Administered 2019-06-18: 1000 mg via ORAL
  Filled 2019-06-17: qty 2

## 2019-06-17 NOTE — ED Notes (Signed)
Pt put back on oxygen via  at 3lpm and oxygen increased to 96%.

## 2019-06-17 NOTE — ED Triage Notes (Signed)
Patient complains of shortness of breath, chest pain, fever and nausea. SOB and nausea has a duration of 3 days and has worsened over that time. She reported anemia with transfusions in the past. EMS reports a temperature of 101, O2 sats in the low 60's and a productive cough when they arrived to the scene. 1000 mg of Tylenol was administered by EMS.   EMS vitals:  146/74 BP 110 HR 28 Resp Rate 96% O2 on 2L nasal canula 21 ETCO2

## 2019-06-18 ENCOUNTER — Inpatient Hospital Stay (HOSPITAL_COMMUNITY): Payer: Medicare Other

## 2019-06-18 DIAGNOSIS — J9601 Acute respiratory failure with hypoxia: Secondary | ICD-10-CM | POA: Diagnosis present

## 2019-06-18 DIAGNOSIS — Z8049 Family history of malignant neoplasm of other genital organs: Secondary | ICD-10-CM | POA: Diagnosis not present

## 2019-06-18 DIAGNOSIS — Z825 Family history of asthma and other chronic lower respiratory diseases: Secondary | ICD-10-CM | POA: Diagnosis not present

## 2019-06-18 DIAGNOSIS — N183 Chronic kidney disease, stage 3 unspecified: Secondary | ICD-10-CM | POA: Diagnosis present

## 2019-06-18 DIAGNOSIS — I340 Nonrheumatic mitral (valve) insufficiency: Secondary | ICD-10-CM | POA: Diagnosis not present

## 2019-06-18 DIAGNOSIS — Z79899 Other long term (current) drug therapy: Secondary | ICD-10-CM | POA: Diagnosis not present

## 2019-06-18 DIAGNOSIS — Z881 Allergy status to other antibiotic agents status: Secondary | ICD-10-CM | POA: Diagnosis not present

## 2019-06-18 DIAGNOSIS — I2694 Multiple subsegmental pulmonary emboli without acute cor pulmonale: Secondary | ICD-10-CM | POA: Diagnosis not present

## 2019-06-18 DIAGNOSIS — I5032 Chronic diastolic (congestive) heart failure: Secondary | ICD-10-CM | POA: Diagnosis present

## 2019-06-18 DIAGNOSIS — D631 Anemia in chronic kidney disease: Secondary | ICD-10-CM | POA: Diagnosis present

## 2019-06-18 DIAGNOSIS — Z8249 Family history of ischemic heart disease and other diseases of the circulatory system: Secondary | ICD-10-CM | POA: Diagnosis not present

## 2019-06-18 DIAGNOSIS — Z833 Family history of diabetes mellitus: Secondary | ICD-10-CM | POA: Diagnosis not present

## 2019-06-18 DIAGNOSIS — I82409 Acute embolism and thrombosis of unspecified deep veins of unspecified lower extremity: Secondary | ICD-10-CM | POA: Diagnosis not present

## 2019-06-18 DIAGNOSIS — I10 Essential (primary) hypertension: Secondary | ICD-10-CM | POA: Diagnosis not present

## 2019-06-18 DIAGNOSIS — I361 Nonrheumatic tricuspid (valve) insufficiency: Secondary | ICD-10-CM | POA: Diagnosis not present

## 2019-06-18 DIAGNOSIS — D500 Iron deficiency anemia secondary to blood loss (chronic): Secondary | ICD-10-CM | POA: Diagnosis not present

## 2019-06-18 DIAGNOSIS — Z86711 Personal history of pulmonary embolism: Secondary | ICD-10-CM | POA: Diagnosis not present

## 2019-06-18 DIAGNOSIS — Z87891 Personal history of nicotine dependence: Secondary | ICD-10-CM | POA: Diagnosis not present

## 2019-06-18 DIAGNOSIS — R6510 Systemic inflammatory response syndrome (SIRS) of non-infectious origin without acute organ dysfunction: Secondary | ICD-10-CM | POA: Diagnosis present

## 2019-06-18 DIAGNOSIS — Z20822 Contact with and (suspected) exposure to covid-19: Secondary | ICD-10-CM | POA: Diagnosis present

## 2019-06-18 DIAGNOSIS — L03818 Cellulitis of other sites: Secondary | ICD-10-CM | POA: Diagnosis not present

## 2019-06-18 DIAGNOSIS — N179 Acute kidney failure, unspecified: Secondary | ICD-10-CM | POA: Diagnosis present

## 2019-06-18 DIAGNOSIS — R7881 Bacteremia: Secondary | ICD-10-CM | POA: Diagnosis not present

## 2019-06-18 DIAGNOSIS — Z20828 Contact with and (suspected) exposure to other viral communicable diseases: Secondary | ICD-10-CM

## 2019-06-18 DIAGNOSIS — I2699 Other pulmonary embolism without acute cor pulmonale: Secondary | ICD-10-CM | POA: Diagnosis not present

## 2019-06-18 DIAGNOSIS — Z6841 Body Mass Index (BMI) 40.0 and over, adult: Secondary | ICD-10-CM | POA: Diagnosis not present

## 2019-06-18 DIAGNOSIS — A408 Other streptococcal sepsis: Secondary | ICD-10-CM | POA: Diagnosis present

## 2019-06-18 DIAGNOSIS — R0602 Shortness of breath: Secondary | ICD-10-CM

## 2019-06-18 DIAGNOSIS — Z885 Allergy status to narcotic agent status: Secondary | ICD-10-CM | POA: Diagnosis not present

## 2019-06-18 DIAGNOSIS — I2690 Septic pulmonary embolism without acute cor pulmonale: Secondary | ICD-10-CM | POA: Diagnosis present

## 2019-06-18 DIAGNOSIS — Z86010 Personal history of colonic polyps: Secondary | ICD-10-CM | POA: Diagnosis not present

## 2019-06-18 DIAGNOSIS — L03115 Cellulitis of right lower limb: Secondary | ICD-10-CM | POA: Diagnosis present

## 2019-06-18 DIAGNOSIS — Z823 Family history of stroke: Secondary | ICD-10-CM | POA: Diagnosis not present

## 2019-06-18 DIAGNOSIS — I130 Hypertensive heart and chronic kidney disease with heart failure and stage 1 through stage 4 chronic kidney disease, or unspecified chronic kidney disease: Secondary | ICD-10-CM | POA: Diagnosis present

## 2019-06-18 DIAGNOSIS — B955 Unspecified streptococcus as the cause of diseases classified elsewhere: Secondary | ICD-10-CM | POA: Diagnosis not present

## 2019-06-18 DIAGNOSIS — N1831 Chronic kidney disease, stage 3a: Secondary | ICD-10-CM | POA: Diagnosis not present

## 2019-06-18 DIAGNOSIS — Z7982 Long term (current) use of aspirin: Secondary | ICD-10-CM | POA: Diagnosis not present

## 2019-06-18 LAB — FERRITIN: Ferritin: 10 ng/mL — ABNORMAL LOW (ref 11–307)

## 2019-06-18 LAB — PROTIME-INR
INR: 1.1 (ref 0.8–1.2)
Prothrombin Time: 13.6 seconds (ref 11.4–15.2)

## 2019-06-18 LAB — D-DIMER, QUANTITATIVE: D-Dimer, Quant: 10.41 ug/mL-FEU — ABNORMAL HIGH (ref 0.00–0.50)

## 2019-06-18 LAB — CBC WITH DIFFERENTIAL/PLATELET
Abs Immature Granulocytes: 0.14 10*3/uL — ABNORMAL HIGH (ref 0.00–0.07)
Basophils Absolute: 0.1 10*3/uL (ref 0.0–0.1)
Basophils Relative: 0 %
Eosinophils Absolute: 0.1 10*3/uL (ref 0.0–0.5)
Eosinophils Relative: 0 %
HCT: 33.3 % — ABNORMAL LOW (ref 36.0–46.0)
Hemoglobin: 10.1 g/dL — ABNORMAL LOW (ref 12.0–15.0)
Immature Granulocytes: 1 %
Lymphocytes Relative: 3 %
Lymphs Abs: 0.6 10*3/uL — ABNORMAL LOW (ref 0.7–4.0)
MCH: 27.2 pg (ref 26.0–34.0)
MCHC: 30.3 g/dL (ref 30.0–36.0)
MCV: 89.5 fL (ref 80.0–100.0)
Monocytes Absolute: 0.5 10*3/uL (ref 0.1–1.0)
Monocytes Relative: 3 %
Neutro Abs: 17.1 10*3/uL — ABNORMAL HIGH (ref 1.7–7.7)
Neutrophils Relative %: 93 %
Platelets: 308 10*3/uL (ref 150–400)
RBC: 3.72 MIL/uL — ABNORMAL LOW (ref 3.87–5.11)
RDW: 16.7 % — ABNORMAL HIGH (ref 11.5–15.5)
WBC: 18.3 10*3/uL — ABNORMAL HIGH (ref 4.0–10.5)
nRBC: 0.3 % — ABNORMAL HIGH (ref 0.0–0.2)

## 2019-06-18 LAB — COMPREHENSIVE METABOLIC PANEL
ALT: 16 U/L (ref 0–44)
ALT: 20 U/L (ref 0–44)
AST: 24 U/L (ref 15–41)
AST: 34 U/L (ref 15–41)
Albumin: 3.5 g/dL (ref 3.5–5.0)
Albumin: 3.1 g/dL — ABNORMAL LOW (ref 3.5–5.0)
Alkaline Phosphatase: 99 U/L (ref 38–126)
Alkaline Phosphatase: 95 U/L (ref 38–126)
Anion gap: 12 (ref 5–15)
Anion gap: 11 (ref 5–15)
BUN: 25 mg/dL — ABNORMAL HIGH (ref 8–23)
BUN: 30 mg/dL — ABNORMAL HIGH (ref 8–23)
CO2: 27 mmol/L (ref 22–32)
CO2: 28 mmol/L (ref 22–32)
Calcium: 9 mg/dL (ref 8.9–10.3)
Calcium: 8.4 mg/dL — ABNORMAL LOW (ref 8.9–10.3)
Chloride: 102 mmol/L (ref 98–111)
Chloride: 100 mmol/L (ref 98–111)
Creatinine, Ser: 1.23 mg/dL — ABNORMAL HIGH (ref 0.44–1.00)
Creatinine, Ser: 1.47 mg/dL — ABNORMAL HIGH (ref 0.44–1.00)
GFR calc Af Amer: 47 mL/min — ABNORMAL LOW (ref >60)
GFR calc Af Amer: 38 mL/min — ABNORMAL LOW (ref >60)
GFR calc non Af Amer: 41 mL/min — ABNORMAL LOW (ref >60)
GFR calc non Af Amer: 33 mL/min — ABNORMAL LOW (ref >60)
Glucose, Bld: 122 mg/dL — ABNORMAL HIGH (ref 70–99)
Glucose, Bld: 123 mg/dL — ABNORMAL HIGH (ref 70–99)
Potassium: 3.2 mmol/L — ABNORMAL LOW (ref 3.5–5.1)
Potassium: 3.9 mmol/L (ref 3.5–5.1)
Sodium: 141 mmol/L (ref 135–145)
Sodium: 139 mmol/L (ref 135–145)
Total Bilirubin: 1.5 mg/dL — ABNORMAL HIGH (ref 0.3–1.2)
Total Bilirubin: 1 mg/dL (ref 0.3–1.2)
Total Protein: 6.4 g/dL — ABNORMAL LOW (ref 6.5–8.1)
Total Protein: 6 g/dL — ABNORMAL LOW (ref 6.5–8.1)

## 2019-06-18 LAB — LACTATE DEHYDROGENASE: LDH: 213 U/L — ABNORMAL HIGH (ref 98–192)

## 2019-06-18 LAB — URINALYSIS, ROUTINE W REFLEX MICROSCOPIC
Bilirubin Urine: NEGATIVE
Glucose, UA: NEGATIVE mg/dL
Hgb urine dipstick: NEGATIVE
Ketones, ur: NEGATIVE mg/dL
Leukocytes,Ua: NEGATIVE
Nitrite: POSITIVE — AB
Protein, ur: NEGATIVE mg/dL
Specific Gravity, Urine: 1.016 (ref 1.005–1.030)
pH: 7 (ref 5.0–8.0)

## 2019-06-18 LAB — SARS CORONAVIRUS 2 (TAT 6-24 HRS): SARS Coronavirus 2: NEGATIVE

## 2019-06-18 LAB — LACTIC ACID, PLASMA
Lactic Acid, Venous: 2.5 mmol/L (ref 0.5–1.9)
Lactic Acid, Venous: 2.5 mmol/L (ref 0.5–1.9)
Lactic Acid, Venous: 3 mmol/L (ref 0.5–1.9)
Lactic Acid, Venous: 1.7 mmol/L (ref 0.5–1.9)

## 2019-06-18 LAB — PROCALCITONIN: Procalcitonin: 1.09 ng/mL

## 2019-06-18 LAB — C-REACTIVE PROTEIN: CRP: 3.2 mg/dL — ABNORMAL HIGH (ref <1.0)

## 2019-06-18 LAB — FIBRINOGEN: Fibrinogen: 504 mg/dL — ABNORMAL HIGH (ref 210–475)

## 2019-06-18 LAB — POC SARS CORONAVIRUS 2 AG -  ED: SARS Coronavirus 2 Ag: NEGATIVE

## 2019-06-18 LAB — ABO/RH: ABO/RH(D): B POS

## 2019-06-18 LAB — TRIGLYCERIDES: Triglycerides: 116 mg/dL (ref <150)

## 2019-06-18 MED ORDER — GUAIFENESIN-DM 100-10 MG/5ML PO SYRP: 10.0000 mL | ORAL_SOLUTION | ORAL | Status: DC | PRN

## 2019-06-18 MED ORDER — ALBUTEROL SULFATE HFA 108 (90 BASE) MCG/ACT IN AERS
2.0000 | INHALATION_SPRAY | Freq: Three times a day (TID) | RESPIRATORY_TRACT | Status: DC
  Administered 2019-06-19 – 2019-06-20 (×4): 2 via RESPIRATORY_TRACT

## 2019-06-18 MED ORDER — SODIUM CHLORIDE 0.9 % IV BOLUS
500.0000 mL | Freq: Once | INTRAVENOUS | Status: AC
  Administered 2019-06-18: 500 mL via INTRAVENOUS

## 2019-06-18 MED ORDER — TIZANIDINE HCL 4 MG PO TABS: 2.0000 mg | ORAL_TABLET | Freq: Three times a day (TID) | ORAL | Status: DC | PRN

## 2019-06-18 MED ORDER — TRAMADOL HCL 50 MG PO TABS: 50.0000 mg | ORAL_TABLET | Freq: Three times a day (TID) | ORAL | Status: DC | PRN

## 2019-06-18 MED ORDER — ENOXAPARIN SODIUM 40 MG/0.4ML ~~LOC~~ SOLN: 40.0000 mg | SUBCUTANEOUS | Status: DC

## 2019-06-18 MED ORDER — ASPIRIN EC 81 MG PO TBEC
81.0000 mg | DELAYED_RELEASE_TABLET | Freq: Every day | ORAL | Status: DC
  Administered 2019-06-18 – 2019-06-21 (×4): 81 mg via ORAL
  Filled 2019-06-18 (×4): qty 1

## 2019-06-18 MED ORDER — TRIAMTERENE-HCTZ 37.5-25 MG PO TABS
1.0000 | ORAL_TABLET | Freq: Every day | ORAL | Status: DC
  Filled 2019-06-18: qty 1

## 2019-06-18 MED ORDER — SODIUM CHLORIDE 0.9 % IV SOLN
500.0000 mg | Freq: Every day | INTRAVENOUS | Status: DC
  Administered 2019-06-18 – 2019-06-19 (×2): 500 mg via INTRAVENOUS
  Filled 2019-06-18 (×3): qty 500

## 2019-06-18 MED ORDER — POTASSIUM CHLORIDE CRYS ER 20 MEQ PO TBCR
40.0000 meq | EXTENDED_RELEASE_TABLET | Freq: Every day | ORAL | Status: DC
  Administered 2019-06-18: 40 meq via ORAL
  Filled 2019-06-18: qty 2

## 2019-06-18 MED ORDER — ENOXAPARIN SODIUM 120 MG/0.8ML ~~LOC~~ SOLN
120.0000 mg | Freq: Two times a day (BID) | SUBCUTANEOUS | Status: DC
  Administered 2019-06-18 – 2019-06-21 (×7): 120 mg via SUBCUTANEOUS
  Filled 2019-06-18 (×9): qty 0.8

## 2019-06-18 MED ORDER — ALBUTEROL SULFATE HFA 108 (90 BASE) MCG/ACT IN AERS
2.0000 | INHALATION_SPRAY | Freq: Four times a day (QID) | RESPIRATORY_TRACT | Status: DC
  Administered 2019-06-18 (×3): 2 via RESPIRATORY_TRACT
  Filled 2019-06-18: qty 6.7

## 2019-06-18 MED ORDER — SODIUM CHLORIDE (PF) 0.9 % IJ SOLN
INTRAMUSCULAR | Status: AC
  Filled 2019-06-18: qty 50

## 2019-06-18 MED ORDER — GABAPENTIN 300 MG PO CAPS
300.0000 mg | ORAL_CAPSULE | Freq: Every day | ORAL | Status: DC
  Administered 2019-06-18 – 2019-06-21 (×4): 300 mg via ORAL
  Filled 2019-06-18 (×3): qty 1
  Filled 2019-06-18: qty 3

## 2019-06-18 MED ORDER — ONDANSETRON HCL 4 MG/2ML IJ SOLN: 4.0000 mg | Freq: Four times a day (QID) | INTRAMUSCULAR | Status: DC | PRN

## 2019-06-18 MED ORDER — POTASSIUM CHLORIDE CRYS ER 20 MEQ PO TBCR
30.0000 meq | EXTENDED_RELEASE_TABLET | Freq: Once | ORAL | Status: AC
  Administered 2019-06-18: 30 meq via ORAL
  Filled 2019-06-18: qty 1

## 2019-06-18 MED ORDER — SODIUM CHLORIDE 0.9 % IV SOLN
2.0000 g | Freq: Every day | INTRAVENOUS | Status: DC
  Administered 2019-06-18 – 2019-06-19 (×2): 2 g via INTRAVENOUS
  Filled 2019-06-18 (×2): qty 20

## 2019-06-18 MED ORDER — HYDROCOD POLST-CPM POLST ER 10-8 MG/5ML PO SUER: 5.0000 mL | Freq: Two times a day (BID) | ORAL | Status: DC | PRN

## 2019-06-18 MED ORDER — GABAPENTIN 300 MG PO CAPS
600.0000 mg | ORAL_CAPSULE | Freq: Every day | ORAL | Status: DC
  Administered 2019-06-18 – 2019-06-20 (×3): 600 mg via ORAL
  Filled 2019-06-18 (×3): qty 2

## 2019-06-18 MED ORDER — ONDANSETRON HCL 4 MG PO TABS: 4.0000 mg | ORAL_TABLET | Freq: Four times a day (QID) | ORAL | Status: DC | PRN

## 2019-06-18 MED ORDER — IOHEXOL 350 MG/ML SOLN
75.0000 mL | Freq: Once | INTRAVENOUS | Status: AC | PRN
  Administered 2019-06-18: 75 mL via INTRAVENOUS

## 2019-06-18 MED ORDER — FUROSEMIDE 40 MG PO TABS: 80.0000 mg | ORAL_TABLET | Freq: Every day | ORAL | Status: DC

## 2019-06-18 MED ORDER — ACETAMINOPHEN 325 MG PO TABS: 650.0000 mg | ORAL_TABLET | Freq: Four times a day (QID) | ORAL | Status: DC | PRN

## 2019-06-18 NOTE — Plan of Care (Signed)
  Problem: Education: Goal: Knowledge of General Education information will improve Description: Including pain rating scale, medication(s)/side effects and non-pharmacologic comfort measures Outcome: Progressing   Problem: Clinical Measurements: Goal: Will remain free from infection Outcome: Progressing   Problem: Nutrition: Goal: Adequate nutrition will be maintained Outcome: Progressing   Problem: Coping: Goal: Level of anxiety will decrease Outcome: Progressing   Problem: Pain Managment: Goal: General experience of comfort will improve Outcome: Progressing   Problem: Safety: Goal: Ability to remain free from injury will improve Outcome: Progressing   Problem: Skin Integrity: Goal: Risk for impaired skin integrity will decrease Outcome: Progressing   

## 2019-06-18 NOTE — Progress Notes (Signed)
PHARMACY NOTE -  Lovenox  Pharmacy has been assisting with dosing of Lovenox for PE.  Dosage remains stable at 1 mg/kg SQ bid and need for further dosage adjustment appears unlikely at present given baseline renal function  Pharmacy will sign off, following peripherally for dose adjustments. Please reconsult if a change in clinical status warrants re-evaluation of dosage.  Reuel Boom, PharmD, BCPS [phone] 06/18/2019, 11:04 AM

## 2019-06-18 NOTE — Progress Notes (Signed)
PROGRESS NOTE  Chelsea Keller [id number] DOB: [birth year] DOA: 06/17/2019 PCP: Venia Carbon, MD  HPI/Recap of past 24 hours: HPI from Dr Werner Lean is a 84 y.o. female with medical history significant for previous PE s/p xarelto over a year ago, chronic diastolic CHF, chronic BLE edema, presents to ED with fever, SOB, cough, nausea, progressively worsening for a few days. EMS called, O2 sat in the 60s. In the ED, sats in the low 90s on 3L via [place]. Labs showed WBC 18k, K 3.2, procalcitonin 1.09, lactate 2.5, D-dimer 10.41, Covid antigen negative, PCR pending.  Vital signs on admission showed Tm 101.9, hr 112, RR 30.  Chest x-ray unremarkable.  Patient admitted for further management    Today, patient still complaining of shortness of breath, denies any chest pain, abdominal pain, further nausea/vomiting.    Assessment/Plan: Principal Problem:   Acute respiratory failure with hypoxia (HCC) Active Problems:   Essential hypertension   Chronic diastolic CHF (congestive heart failure) (HCC)   CKD (chronic kidney disease) stage 3, GFR 30-59 ml/min   History of pulmonary embolism   SIRS (systemic inflammatory response syndrome) (HCC)   Suspected COVID-19 virus infection   Acute hypoxic respiratory failure likely 2/2 PE, r/o Covid pneumonia Currently afebrile, with leukocytosis Currently requiring about 4 L of O2 CRP 3.2, ferritin 10 D-dimer elevated at 10.41 Chest x-ray unremarkable EKG with no acute ST changes, sinus tachycardia CTA chest showed bilateral PE of moderate clot burden, no right ventricular strain, no pulmonary infarction or pneumonia Echo pending Started on therapeutic Lovenox, continue Continue azithromycin, ceftriaxone Supplemental oxygen prn Monitor closely, telemetry, continuous pulse ox  Sepsis Unknown etiology, r/o UTI Febrile, tachycardic, tachypneic Lactic acidosis, will trend Procalcitonin 1.09 BC x2 pending Chest x-ray/CT chest  unremarkable for pneumonia UA showed positive nitrite, rare bacteria, UC pending Continue azithromycin, ceftriaxone  Mild AKI on CKD stage III Last creatinine on 12/2018 was 0.93, now 1.23 Likely due to above Daily BMP  Hypokalemia Replace as needed  Anemia of CKD Hemoglobin around baseline Daily CBC  Hypertension BP on the soft side Hold home triamterene-hydrochlorothiazide  Chronic diastolic HF Does not appear overloaded, chronic BLE edema We will hold off Lasix for now Strict I's and O's, daily weights  Morbid obesity Advised lifestyle modification        Malnutrition Type:      Malnutrition Characteristics:      Nutrition Interventions:       Estimated body mass index is 48.47 kg/m as calculated from the following:   Height as of this encounter: 5\' 2"  (1.575 m).   Weight as of this encounter: 120.2 kg.     Code Status: Full  Family Communication: None at bedside  Disposition Plan: To be determined   Consultants:  None  Procedures:  None  Antimicrobials:  Ceftriaxone  Azithromycin  DVT prophylaxis: Therapeutic Lovenox   Objective: Vitals:   06/18/19 0900 06/18/19 0930 06/18/19 1000 06/18/19 1030  BP: (!) 116/52 (!) 117/58 (!) 119/47 (!) 102/51  Pulse: 84 80 81 81  Resp: 19 17 17 17   Temp:      TempSrc:      SpO2: 100% 99% 98% 98%  Weight:      Height:        Intake/Output Summary (Last 24 hours) at 06/18/2019 1344 Last data filed at 06/18/2019 0725 Gross per 24 hour  Intake 347.04 ml  Output --  Net 347.04 ml   Filed Weights   06/18/19 0600 06/18/19  0605  Weight: 120.2 kg 120.2 kg    Exam:  General: NAD, chronically ill-appearing, deconditioned  Cardiovascular: S1, S2 present  Respiratory:  Diminished breath sounds bilaterally  Abdomen: Soft, nontender, nondistended, bowel sounds present  Musculoskeletal: 1+ bilateral pedal edema noted  Skin:  Bilateral lower extremity chronic venous  changes  Psychiatry: Normal mood   Data Reviewed: CBC: Recent Labs  Lab 06/17/19 2333  WBC 18.3*  NEUTROABS 17.1*  HGB 10.1*  HCT 33.3*  MCV 89.5  PLT [id number]   Basic Metabolic Panel: Recent Labs  Lab 06/17/19 2333  NA 141  K 3.2*  CL 102  CO2 27  GLUCOSE 122*  BUN 25*  CREATININE 1.23*  CALCIUM 9.0   GFR: Estimated Creatinine Clearance: 43.5 mL/min (A) (by C-G formula based on SCr of 1.23 mg/dL (H)). Liver Function Tests: Recent Labs  Lab 06/17/19 2333  AST 24  ALT 16  ALKPHOS 99  BILITOT 1.5*  PROT 6.4*  ALBUMIN 3.5   No results for input(s): LIPASE, AMYLASE in the last 168 hours. No results for input(s): AMMONIA in the last 168 hours. Coagulation Profile: Recent Labs  Lab 06/17/19 2333  INR 1.1   Cardiac Enzymes: No results for input(s): CKTOTAL, CKMB, CKMBINDEX, TROPONINI in the last 168 hours. BNP (last 3 results) No results for input(s): PROBNP in the last 8760 hours. HbA1C: No results for input(s): HGBA1C in the last 72 hours. CBG: No results for input(s): GLUCAP in the last 168 hours. Lipid Profile: Recent Labs    06/18/19 0004  TRIG 116   Thyroid Function Tests: No results for input(s): TSH, T4TOTAL, FREET4, T3FREE, THYROIDAB in the last 72 hours. Anemia Panel: Recent Labs    06/18/19 0004  FERRITIN 10*   Urine analysis:    Component Value Date/Time   COLORURINE YELLOW 06/18/2019 0515   APPEARANCEUR HAZY (A) 06/18/2019 0515   LABSPEC 1.016 06/18/2019 0515   PHURINE 7.0 06/18/2019 0515   GLUCOSEU NEGATIVE 06/18/2019 0515   HGBUR NEGATIVE 06/18/2019 0515   HGBUR negative 01/13/2010 1056   BILIRUBINUR NEGATIVE 06/18/2019 0515   BILIRUBINUR negative 02/11/2017 1709   KETONESUR NEGATIVE 06/18/2019 0515   PROTEINUR NEGATIVE 06/18/2019 0515   UROBILINOGEN 0.2 02/11/2017 1709   UROBILINOGEN 0.2 01/13/2010 1056   NITRITE POSITIVE (A) 06/18/2019 0515   LEUKOCYTESUR NEGATIVE 06/18/2019 0515   Sepsis  Labs: @LABRCNTIP (procalcitonin:4,lacticidven:4)  ) Recent Results (from the past 240 hour(s))  Culture, blood (Routine x 2)     Status: None (Preliminary result)   Collection Time: 06/17/19 11:34 PM   Specimen: BLOOD LEFT FOREARM  Result Value Ref Range Status   Specimen Description   Final    BLOOD LEFT FOREARM Performed at [facility], [place] [address], [place], [place] [address]    Special Requests   Final    BOTTLES DRAWN AEROBIC AND ANAEROBIC Blood Culture results may not be optimal due to an excessive volume of blood received in culture bottles Performed at [facility] [address] [place], [place] [address]    Culture PENDING  Incomplete   Report Status PENDING  Incomplete  Culture, blood (Routine x 2)     Status: None (Preliminary result)   Collection Time: 06/17/19 11:34 PM   Specimen: BLOOD  Result Value Ref Range Status   Specimen Description   Final    BLOOD RIGHT ANTECUBITAL Performed at [facility], [place] [address] [place], [place] [address]    Special Requests   Final  BOTTLES DRAWN AEROBIC AND ANAEROBIC Blood Culture adequate volume Performed at [facility] [address], [place], [place] [address]    Culture PENDING  Incomplete   Report Status PENDING  Incomplete      Studies: CT ANGIO CHEST PE W OR WO CONTRAST  Result Date: 06/18/2019 CLINICAL DATA:  Short of breath, chest pain and fever EXAM: CT ANGIOGRAPHY CHEST WITH CONTRAST TECHNIQUE: Multidetector CT imaging of the chest was performed using the standard protocol during bolus administration of intravenous contrast. Multiplanar CT image reconstructions and MIPs were obtained to evaluate the vascular anatomy. CONTRAST:  57mL OMNIPAQUE IOHEXOL 350 MG/ML SOLN COMPARISON:  CT chest September 14, 2018 FINDINGS: Cardiovascular: Suboptimal exam due to respiratory motion and dispersion of the contrast bolus. However, with this caveat there is a filling defect  within the proximal RIGHT lower lobe pulmonary artery (image 64/4) which branches into segmental arteries (image 66/4). Similar filling defect in the LEFT lobe pulmonary artery (image 70/4). Potential filling defect in the RIGHT upper lobe pulmonary artery on image 50/4. Are overall clot burden is moderate. No evidence of RIGHT ventricular strain with the LEFT ventricle to RIGHT ventricle ratio less than 1 period. Coronary artery calcification and aortic atherosclerotic calcification. Mediastinum/Nodes: No axillary or supraclavicular adenopathy. No mediastinal adenopathy. Lungs/Pleura: No pulmonary infarction. No pneumonia. Mild interlobular septal thickening. Motion degradation. Upper Abdomen: Limited view of the liver, kidneys, pancreas are unremarkable. Normal adrenal glands. Postcholecystectomy. Musculoskeletal: No aggressive osseous lesion. Review of the MIP images confirms the above findings. IMPRESSION: 1. Bilateral pulmonary emboli of moderate clot burden. No RIGHT ventricular strain. 2. No pulmonary infarction or pneumonia. 3. Coronary artery calcification and Aortic Atherosclerosis (ICD10-I70.0). Critical Value/emergent results were called by telephone at the time of interpretation on 06/18/2019 at 4:44 am to providerFloyd, MD, who verbally acknowledged these results. Electronically Signed   By: Suzy Bouchard M.D.   On: 06/18/2019 04:45   DG Chest Port 1 View  Result Date: 06/17/2019 CLINICAL DATA:  Shortness of breath and chest pain EXAM: PORTABLE CHEST 1 VIEW COMPARISON:  08/11/2018 FINDINGS: Cardiac shadow is stable. The lungs are clear bilaterally. Aortic calcifications are noted. No bony abnormality is noted. IMPRESSION: No acute abnormality noted. Aortic Atherosclerosis (ICD10-I70.0). Electronically Signed   By: Inez Catalina M.D.   On: 06/17/2019 23:35    Scheduled Meds: . albuterol  2 puff Inhalation Q6H  . aspirin EC  81 mg Oral Daily  . chlorpheniramine-HYDROcodone  5 mL Oral Once    . enoxaparin (LOVENOX) injection  120 mg Subcutaneous BID  . gabapentin  300 mg Oral Daily  . gabapentin  600 mg Oral QHS  . potassium chloride SA  40 mEq Oral Daily  . sodium chloride (PF)        Continuous Infusions: . azithromycin Stopped (06/18/19 0725)  . cefTRIAXone (ROCEPHIN)  IV Stopped (06/18/19 0549)     LOS: 0 days     Alma Friendly, MD [facility]  If 7PM-7AM, please contact night-coverage [email or web address] 06/18/2019, 1:44 PM

## 2019-06-18 NOTE — ED Notes (Addendum)
Date and time results received: 06/18/19 0250  Test: lactic acid Critical Value: 2.5  Name of Provider Notified: Tyrone Nine, MD  Orders Received? Or Actions Taken?: no orders at this time.

## 2019-06-18 NOTE — ED Provider Notes (Signed)
[facility] DEPT Provider Note   CSN: [id number] Arrival date & time: 06/17/19  2133     History Chief Complaint  Patient presents with  . Fever  . Shortness of Breath  . Cough  . Chest Pain  . Nausea    Chelsea Keller is a 84 y.o. female.  84 yo F with a chief complaint of fatigue.  Going on for the past few days.  Found to have O2 saturation in the 60s per EMS.  Patient has had a very mild cough with this.  Felt nauseated.  No vomiting or diarrhea.  No abdominal pain no chest pain or pressure.  She thinks it feels similar as when she was anemic.  No appreciable fevers at home.  The history is provided by the patient.  Fever Associated symptoms: chest pain and cough   Associated symptoms: no chills, no congestion, no dysuria, no headaches, no myalgias, no nausea, no rhinorrhea and no vomiting   Shortness of Breath Associated symptoms: chest pain, cough and fever   Associated symptoms: no headaches, no vomiting and no wheezing   Cough Associated symptoms: chest pain, fever and shortness of breath   Associated symptoms: no chills, no headaches, no myalgias, no rhinorrhea and no wheezing   Chest Pain Associated symptoms: cough, fever and shortness of breath   Associated symptoms: no dizziness, no headache, no nausea, no palpitations and no vomiting   Illness Severity:  Moderate Onset quality:  Sudden Duration:  2 days Timing:  Constant Progression:  Worsening Chronicity:  New Associated symptoms: chest pain, cough, fever and shortness of breath   Associated symptoms: no congestion, no headaches, no myalgias, no nausea, no rhinorrhea, no vomiting and no wheezing        Past Medical History:  Diagnosis Date  . Allergy   . Anemia   . Anxiety   . Arthritis   . Arthritis of sacroiliac joint of both sides 11/12/2017  . Bilateral pulmonary embolism ([place]) 06/23/2017  . Chronic diastolic CHF (congestive heart failure) (HCC)    a. Echo 1/16:   mild LVH, EF normal, grade 1 DD, MAC  . Chronic venous insufficiency    chronic LE edema  . DDD (degenerative disc disease), lumbar 11/12/2017  . Degenerative joint disease (DJD) of hip, Bilateral  11/12/2017  . Depression   . Fibromyalgia    constant pain  . Hx of cardiac catheterization    a. LHC in [place] "ok" per patient with mild plaque in a single vessel - records not available  . Hx of cardiovascular stress test    a. Nuclear study in 2008 normal  . Hx of colonic polyps   . Hypertension   . Hypertriglyceridemia   . Impaired fasting glucose   . PONV (postoperative nausea and vomiting)   . PUD (peptic ulcer disease)    hx of gastric ulcer  . Pulmonary emboli ([place]) 06/2017  . Vitamin B12 deficiency     Patient Active Problem List   Diagnosis Date Noted  . Cellulitis 05/22/2019  . Sacroiliitis ([place]) 02/17/2019  . Vertigo 01/12/2019  . Cervical strain, acute 10/31/2018  . Aortic atherosclerosis ([place]) 09/12/2018  . Iron deficiency anemia due to chronic blood loss 08/11/2018  . Diarrhea 04/20/2018  . Acute gout 04/01/2018  . Angiodysplasia of colon   . Symptomatic anemia 03/21/2018  . Degenerative joint disease (DJD) of hip, Bilateral  11/12/2017  . DDD (degenerative disc disease), lumbar 11/12/2017  . Arthritis of sacroiliac joint of  both sides 11/12/2017  . History of pulmonary embolism 11/07/2017  . Chronic anticoagulation 11/07/2017  . Neurodermatitis 07/28/2017  . Urinary incontinence 05/26/2017  . Intractable pain-left hip 04/03/2017  . Fibromyalgia 04/03/2017  . Chronic diastolic CHF (congestive heart failure) ([place]) 04/03/2017  . Hypertension 04/03/2017  . Hypertriglyceridemia 04/03/2017  . Vitamin B12 deficiency 04/03/2017  . CKD (chronic kidney disease) stage 3, GFR 30-59 ml/min 04/03/2017  . Left sided sciatica   . Advance directive discussed with patient 01/19/2017  . Mood disorder ([place]) 11/03/2013  . Knee joint pain 10/31/2013  . Psoriasis 02/21/2013  .  Routine general medical examination at a health care facility 06/14/2012  . BMI 50.0-59.9, adult ([place]) 12/02/2011  . VENTRICULAR HYPERTROPHY, LEFT 10/19/2008  . Sleep disturbance 10/25/2007  . Venous (peripheral) insufficiency 08/11/2007  . Osteoarthritis, multiple sites 08/11/2007  . Impaired fasting glucose 08/11/2007  . Hx of adenomatous colonic polyps 05/20/2007  . B12 DEFICIENCY 09/30/2006  . Essential hypertension 09/30/2006  . Allergic rhinitis due to pollen 09/30/2006    Past Surgical History:  Procedure Laterality Date  . ABDOMINAL HYSTERECTOMY    . CATARACT EXTRACTION  10/2003   OD  . CHOLECYSTECTOMY    . COLONOSCOPY W/ POLYPECTOMY    . COLONOSCOPY WITH PROPOFOL N/A 10/15/2014   Procedure: COLONOSCOPY WITH PROPOFOL;  Surgeon: Ladene Artist, MD;  Location: [facility] ENDOSCOPY;  Service: Endoscopy;  Laterality: N/A;  . COLONOSCOPY WITH PROPOFOL N/A 03/23/2018   Procedure: COLONOSCOPY WITH PROPOFOL;  Surgeon: Gatha Mayer, MD;  Location: [facility] ENDOSCOPY;  Service: Endoscopy;  Laterality: N/A;  . ESOPHAGOGASTRODUODENOSCOPY (EGD) WITH PROPOFOL N/A 10/15/2014   Procedure: ESOPHAGOGASTRODUODENOSCOPY (EGD) WITH PROPOFOL;  Surgeon: Ladene Artist, MD;  Location: [facility] ENDOSCOPY;  Service: Endoscopy;  Laterality: N/A;  . EXTERNAL FIXATION ANKLE FRACTURE     Fx.  left ankle-fixation with pins later removed sec to infection 1985  . EXTERNAL FIXATION WRIST FRACTURE  1985   left with pins  . EYE SURGERY    . FEMUR FRACTURE SURGERY  06/2009  . FRACTURE SURGERY    . HARDWARE REMOVAL Left 10/05/2013   Procedure: HARDWARE REMOVAL LEFT DISTAL FEMUR;  Surgeon: Rozanna Box, MD;  Location: [facility];  Service: Orthopedics;  Laterality: Left;  . HEMIARTHROPLASTY SHOULDER FRACTURE  06/2009  . HOT HEMOSTASIS N/A 03/23/2018   Procedure: HOT HEMOSTASIS (ARGON PLASMA COAGULATION/BICAP);  Surgeon: Gatha Mayer, MD;  Location: [facility] END[facility]COPY;  Service: Endoscopy;  Laterality: N/A;  . JOINT REPLACEMENT    .  STERIOD INJECTION Right 10/05/2013   Procedure: STEROID INJECTION;  Surgeon: Rozanna Box, MD;  Location: [facility];  Service: Orthopedics;  Laterality: Right;  . TONSILLECTOMY    . TONSILLECTOMY    . TOTAL KNEE ARTHROPLASTY  03/09   left     OB History   No obstetric history on file.     Family History  Problem Relation Age of Onset  . Depression Mother   . Cancer Mother        uterine cancer  . Heart attack Father   . Cancer Brother        prostate cancer  . Diabetes Maternal Aunt   . Arthritis Brother   . Asthma Brother   . Stroke Maternal Grandmother   . Pulmonary embolism Daughter     Social History   Tobacco Use  . Smoking status: Former Smoker    Packs/day: 1.00    Years: 40.00    Pack years: 40.00  Types: Cigarettes    Quit date: 07/06/1993    Years since quitting: 25.9  . Smokeless tobacco: Never Used  Substance Use Topics  . Alcohol use: No    Alcohol/week: 0.0 standard drinks  . Drug use: No    Home Medications Prior to Admission medications   Medication Sig Start Date End Date Taking? Authorizing Provider  acetaminophen (TYLENOL) 500 MG tablet Take 1,000-1,500 mg by mouth every 6 (six) hours as needed for moderate pain or headache (pain).     [provider]  amoxicillin-clavulanate (AUGMENTIN) 875-125 MG tablet Take 1 tablet by mouth 2 (two) times daily. 05/22/19   Venia Carbon, MD  aspirin 81 MG tablet Take 81 mg by mouth daily.    [provider]  furosemide (LASIX) 80 MG tablet Take 80 mg by mouth daily.     [provider]  gabapentin (NEURONTIN) 300 MG capsule Take 1 capsule in the morning and 2 capsules at bedtime 06/08/19   Venia Carbon, MD  potassium chloride SA (K-DUR,KLOR-CON) 20 MEQ tablet TAKE 2 TABLETS (40 MEQ TOTAL) DAILY 08/31/18   Venia Carbon, MD  predniSONE (DELTASONE) 20 MG tablet Take 2 tablets (40 mg total) by mouth daily. 05/31/19   Tower, Wynelle Fanny, MD  silver sulfADIAZINE (SILVADENE) 1 %  cream Apply 1 application topically daily. 05/22/19   Venia Carbon, MD  tiZANidine (ZANAFLEX) 2 MG tablet Take 1 tablet (2 mg total) by mouth 3 (three) times daily as needed for muscle spasms. 10/31/18   Venia Carbon, MD  traMADol (ULTRAM) 50 MG tablet Take 1 tablet (50 mg total) by mouth 3 (three) times daily as needed for moderate pain. 10/08/18   Venia Carbon, MD  triamterene-hydrochlorothiazide (MAXZIDE-25) 37.5-25 MG tablet Take 1 tablet by mouth daily. 04/04/19   Venia Carbon, MD    Allergies    Codeine sulfate, Celecoxib, and Erythromycin base  Review of Systems   Review of Systems  Constitutional: Positive for fever. Negative for chills.  HENT: Negative for congestion and rhinorrhea.   Eyes: Negative for redness and visual disturbance.  Respiratory: Positive for cough and shortness of breath. Negative for wheezing.   Cardiovascular: Positive for chest pain. Negative for palpitations.  Gastrointestinal: Negative for nausea and vomiting.  Genitourinary: Negative for dysuria and urgency.  Musculoskeletal: Negative for arthralgias and myalgias.  Skin: Negative for pallor and wound.  Neurological: Negative for dizziness and headaches.    Physical Exam Updated Vital Signs BP (!) 137/53   Pulse 99   Temp (!) 101.9 F (38.8 C) (Oral)   Resp (!) 23   SpO2 98%   Physical Exam Vitals and nursing note reviewed.  Constitutional:      General: She is not in acute distress.    Appearance: She is well-developed. She is obese. She is not diaphoretic.  HENT:     Head: Normocephalic and atraumatic.  Eyes:     Pupils: Pupils are equal, round, and reactive to light.  Cardiovascular:     Rate and Rhythm: Normal rate and regular rhythm.     Heart sounds: No murmur. No friction rub. No gallop.   Pulmonary:     Effort: Pulmonary effort is normal.     Breath sounds: No wheezing or rales.  Abdominal:     General: There is no distension.     Palpations: Abdomen is soft.       Tenderness: There is no abdominal tenderness.  Musculoskeletal:  General: No tenderness.     Cervical back: Normal range of motion and neck supple.  Skin:    General: Skin is warm and dry.  Neurological:     Mental Status: She is alert and oriented to person, place, and time.  Psychiatric:        Behavior: Behavior normal.     ED Results / Procedures / Treatments   Labs (all labs ordered are listed, but only abnormal results are displayed) Labs Reviewed  CULTURE, BLOOD (ROUTINE X 2)  CULTURE, BLOOD (ROUTINE X 2)  COMPREHENSIVE METABOLIC PANEL  LACTIC ACID, PLASMA  LACTIC ACID, PLASMA  CBC WITH DIFFERENTIAL/PLATELET  PROTIME-INR  URINALYSIS, ROUTINE W REFLEX MICROSCOPIC  D-DIMER, QUANTITATIVE (NOT AT [facility])  PROCALCITONIN  LACTATE DEHYDROGENASE  FERRITIN  TRIGLYCERIDES  FIBRINOGEN  C-REACTIVE PROTEIN  POC SARS CORONAVIRUS 2 AG -  ED    EKG EKG Interpretation  Date/Time:  Saturday June 17 2019 22:12:06 EST Ventricular Rate:  108 PR Interval:    QRS Duration: 92 QT Interval:  335 QTC Calculation: 449 R Axis:   -29 Text Interpretation: Sinus tachycardia Ventricular trigeminy Borderline left axis deviation Low voltage, precordial leads Consider anterior infarct Borderline repolarization abnormality Otherwise no significant change Confirmed by Deno Etienne [phone]) on 06/17/2019 11:06:54 PM   Radiology DG Chest Port 1 View  Result Date: 06/17/2019 CLINICAL DATA:  Shortness of breath and chest pain EXAM: PORTABLE CHEST 1 VIEW COMPARISON:  08/11/2018 FINDINGS: Cardiac shadow is stable. The lungs are clear bilaterally. Aortic calcifications are noted. No bony abnormality is noted. IMPRESSION: No acute abnormality noted. Aortic Atherosclerosis (ICD10-I70.0). Electronically Signed   By: Inez Catalina M.D.   On: 06/17/2019 23:35    Procedures Procedures (including critical care time)  Medications Ordered in ED Medications  acetaminophen (TYLENOL) tablet 1,000  mg (has no administration in time range)  ketorolac (TORADOL) 30 MG/ML injection 15 mg (has no administration in time range)  chlorpheniramine-HYDROcodone (TUSSIONEX) 10-8 MG/5ML suspension 5 mL (has no administration in time range)    ED Course  I have reviewed the triage vital signs and the nursing notes.  Pertinent labs & imaging results that were available during my care of the patient were reviewed by me and considered in my medical decision making (see chart for details).    MDM Rules/Calculators/A&P     CHA2DS2/VAS Stroke Risk Points      N/A >= 2 Points: High Risk  1 - 1.99 Points: Medium Risk  0 Points: Low Risk    A final score could not be computed because of missing components.: Last  Change: N/A     This score determines the patient's risk of having a stroke if the  patient has atrial fibrillation.      This score is not applicable to this patient. Components are not  calculated.                   84 yo F with a chief complaints of shortness of fatigue.  Found to have a temperature of 102.  Patient's history sounds concerning for the novel coronavirus as it is occurring during the coronavirus pandemic.  Patient is requiring 3 L of oxygen, will obtain lab work.  Chest x-ray without pneumonia.  Admit.   Rapid Covid test is negative.  Disease process seems most consistent with the novel coronavirus as the patient was profoundly hypoxic.  We will send the longer test.  Discussed with medicine.  CRITICAL CARE Performed by: Marijean Heath  Halah Whiteside   Total critical care time: 35 minutes  Critical care time was exclusive of separately billable procedures and treating other patients.  Critical care was necessary to treat or prevent imminent or life-threatening deterioration.  Critical care was time spent personally by me on the following activities: development of treatment plan with patient and/or surrogate as well as nursing, discussions with consultants, evaluation of  patient's response to treatment, examination of patient, obtaining history from patient or surrogate, ordering and performing treatments and interventions, ordering and review of laboratory studies, ordering and review of radiographic studies, pulse oximetry and re-evaluation of patient's condition.   Final Clinical Impression(s) / ED Diagnoses Final diagnoses:  SOB (shortness of breath)    Rx / DC Orders ED Discharge Orders    None       Deno Etienne, DO 06/18/19 0202

## 2019-06-18 NOTE — Progress Notes (Signed)
Patient admitted to room 1411 from ED. Patient A&O, VSS. Patient's RLE very hot to touch, red, extremely tender. Pt also has an open sore to right distal shin. MD Horris Latino paged to be made aware.

## 2019-06-18 NOTE — ED Notes (Signed)
Chelsea Keller, daughter, [phone]

## 2019-06-18 NOTE — H&P (Signed)
History and Physical    Chelsea Keller PIR:[id number] DOB: 25-Jun-1935 DOA: 06/17/2019  PCP: Venia Carbon, MD  Patient coming from: Home  I have personally briefly reviewed patient's old medical records in [place]  Chief Complaint: Fever, SOB, cough, CP, nausea  HPI: Chelsea Keller is a 84 y.o. female with medical history significant of previous PE, take off of xarelto over a year ago; chronic diastolic CHF, chronic BLE edema.  Patient presents to ED with fever, SOB, cough, nausea.  Symptoms onset a few days ago, worsening.  EMS called.  O2 sat in the 60s initially.  No vomiting nor diarrhea.  No abd pain.  Cough mild.   ED Course: Satting low 90s on 3L via [place].  WBC 18k, Tm 101.9, hr 112 AND RR 30, these later 2 improve with improvement in fever and O2.  K 3.2 (replacing)  Procalcitonin 1.0.  Lactate 2.5.  COVID antigen negative, PCR pending.  D.Dimer 10.41.   Review of Systems: As per HPI, otherwise all review of systems negative.  Past Medical History:  Diagnosis Date  . Allergy   . Anemia   . Anxiety   . Arthritis   . Arthritis of sacroiliac joint of both sides 11/12/2017  . Bilateral pulmonary embolism ([place]) 06/23/2017  . Chronic diastolic CHF (congestive heart failure) (HCC)    a. Echo 1/16:  mild LVH, EF normal, grade 1 DD, MAC  . Chronic venous insufficiency    chronic LE edema  . DDD (degenerative disc disease), lumbar 11/12/2017  . Degenerative joint disease (DJD) of hip, Bilateral  11/12/2017  . Depression   . Fibromyalgia    constant pain  . Hx of cardiac catheterization    a. LHC in [place] "ok" per patient with mild plaque in a single vessel - records not available  . Hx of cardiovascular stress test    a. Nuclear study in 2008 normal  . Hx of colonic polyps   . Hypertension   . Hypertriglyceridemia   . Impaired fasting glucose   . PONV (postoperative nausea and vomiting)   . PUD (peptic ulcer disease)    hx of gastric ulcer  .  Pulmonary emboli ([place]) 06/2017  . Vitamin B12 deficiency     Past Surgical History:  Procedure Laterality Date  . ABDOMINAL HYSTERECTOMY    . CATARACT EXTRACTION  10/2003   OD  . CHOLECYSTECTOMY    . COLONOSCOPY W/ POLYPECTOMY    . COLONOSCOPY WITH PROPOFOL N/A 10/15/2014   Procedure: COLONOSCOPY WITH PROPOFOL;  Surgeon: Ladene Artist, MD;  Location: [facility] ENDOSCOPY;  Service: Endoscopy;  Laterality: N/A;  . COLONOSCOPY WITH PROPOFOL N/A 03/23/2018   Procedure: COLONOSCOPY WITH PROPOFOL;  Surgeon: Gatha Mayer, MD;  Location: [facility] ENDOSCOPY;  Service: Endoscopy;  Laterality: N/A;  . ESOPHAGOGASTRODUODENOSCOPY (EGD) WITH PROPOFOL N/A 10/15/2014   Procedure: ESOPHAGOGASTRODUODENOSCOPY (EGD) WITH PROPOFOL;  Surgeon: Ladene Artist, MD;  Location: [facility] ENDOSCOPY;  Service: Endoscopy;  Laterality: N/A;  . EXTERNAL FIXATION ANKLE FRACTURE     Fx.  left ankle-fixation with pins later removed sec to infection 1985  . EXTERNAL FIXATION WRIST FRACTURE  1985   left with pins  . EYE SURGERY    . FEMUR FRACTURE SURGERY  06/2009  . FRACTURE SURGERY    . HARDWARE REMOVAL Left 10/05/2013   Procedure: HARDWARE REMOVAL LEFT DISTAL FEMUR;  Surgeon: Rozanna Box, MD;  Location: [facility];  Service: Orthopedics;  Laterality: Left;  . HEMIARTHROPLASTY SHOULDER FRACTURE  06/2009  . HOT HEMOSTASIS N/A 03/23/2018   Procedure: HOT HEMOSTASIS (ARGON PLASMA COAGULATION/BICAP);  Surgeon: Gatha Mayer, MD;  Location: [facility] ENDOSCOPY;  Service: Endoscopy;  Laterality: N/A;  . JOINT REPLACEMENT    . STERIOD INJECTION Right 10/05/2013   Procedure: STEROID INJECTION;  Surgeon: Rozanna Box, MD;  Location: [facility];  Service: Orthopedics;  Laterality: Right;  . TONSILLECTOMY    . TONSILLECTOMY    . TOTAL KNEE ARTHROPLASTY  03/09   left     reports that she quit smoking about 25 years ago. Her smoking use included cigarettes. She has a 40.00 pack-year smoking history. She has never used smokeless tobacco. She reports that  she does not drink alcohol or use drugs.  Allergies  Allergen Reactions  . Codeine Sulfate Shortness Of Breath  . Celecoxib Other (See Comments)    Caused vaginal bleeding  . Erythromycin Base Nausea And Vomiting    Family History  Problem Relation Age of Onset  . Depression Mother   . Cancer Mother        uterine cancer  . Heart attack Father   . Cancer Brother        prostate cancer  . Diabetes Maternal Aunt   . Arthritis Brother   . Asthma Brother   . Stroke Maternal Grandmother   . Pulmonary embolism Daughter      Prior to Admission medications   Medication Sig Start Date End Date Taking? Authorizing Provider  acetaminophen (TYLENOL) 500 MG tablet Take 1,000-1,500 mg by mouth every 6 (six) hours as needed for moderate pain or headache (pain).    Yes [provider]  aspirin 81 MG tablet Take 81 mg by mouth daily.   Yes [provider]  furosemide (LASIX) 80 MG tablet Take 80 mg by mouth daily.    Yes [provider]  gabapentin (NEURONTIN) 300 MG capsule Take 1 capsule in the morning and 2 capsules at bedtime 06/08/19  Yes Viviana Simpler I, MD  potassium chloride SA (K-DUR,KLOR-CON) 20 MEQ tablet TAKE 2 TABLETS (40 MEQ TOTAL) DAILY Patient taking differently: Take 40 mEq by mouth daily.  08/31/18  Yes Venia Carbon, MD  silver sulfADIAZINE (SILVADENE) 1 % cream Apply 1 application topically daily. 05/22/19  Yes Venia Carbon, MD  tiZANidine (ZANAFLEX) 2 MG tablet Take 1 tablet (2 mg total) by mouth 3 (three) times daily as needed for muscle spasms. 10/31/18  Yes Venia Carbon, MD  traMADol (ULTRAM) 50 MG tablet Take 1 tablet (50 mg total) by mouth 3 (three) times daily as needed for moderate pain. 10/08/18  Yes Venia Carbon, MD  triamterene-hydrochlorothiazide (MAXZIDE-25) 37.5-25 MG tablet Take 1 tablet by mouth daily. 04/04/19  Yes Venia Carbon, MD    Physical Exam: Vitals:   06/18/19 0100 06/18/19 0200 06/18/19 0230 06/18/19  0300  BP: (!) 137/50 (!) 123/50 126/62 (!) 114/58  Pulse: (!) 105 (!) 103 99 94  Resp: (!) 29 (!) _0 Temp:      TempSrc:      SpO2: 98% 97% 96% 96%    Constitutional: NAD, calm, comfortable Eyes: PERRL, lids and conjunctivae normal ENMT: Mucous membranes are moist. Posterior pharynx clear of any exudate or lesions.Normal dentition.  Neck: normal, supple, no masses, no thyromegaly Respiratory: clear to auscultation bilaterally, no wheezing, no crackles. Normal respiratory effort. No accessory muscle use.  Cardiovascular: Regular rate and rhythm, no murmurs / rubs / gallops. No extremity edema. 2+  pedal pulses. No carotid bruits.  Abdomen: no tenderness, no masses palpated. No hepatosplenomegaly. Bowel sounds positive.  Musculoskeletal: no clubbing / cyanosis. No joint deformity upper and lower extremities. Good ROM, no contractures. Normal muscle tone.  Skin: no rashes, lesions, ulcers. No induration Neurologic: CN 2-12 grossly intact. Sensation intact, DTR normal. Strength 5/5 in all 4.  Psychiatric: Normal judgment and insight. Alert and oriented x 3. Normal mood.    Labs on Admission: I have personally reviewed following labs and imaging studies  CBC: Recent Labs  Lab 06/17/19 2333  WBC 18.3*  NEUTROABS 17.1*  HGB 10.1*  HCT 33.3*  MCV 89.5  PLT 435   Basic Metabolic Panel: Recent Labs  Lab 06/17/19 2333  NA 141  K 3.2*  CL 102  CO2 27  GLUCOSE 122*  BUN 25*  CREATININE 1.23*  CALCIUM 9.0   GFR: CrCl cannot be calculated (Unknown ideal weight.). Liver Function Tests: Recent Labs  Lab 06/17/19 2333  AST 24  ALT 16  ALKPHOS 99  BILITOT 1.5*  PROT 6.4*  ALBUMIN 3.5   No results for input(s): LIPASE, AMYLASE in the last 168 hours. No results for input(s): AMMONIA in the last 168 hours. Coagulation Profile: Recent Labs  Lab 06/17/19 2333  INR 1.1   Cardiac Enzymes: No results for input(s): CKTOTAL, CKMB, CKMBINDEX, TROPONINI in the last 168  hours. BNP (last 3 results) No results for input(s): PROBNP in the last 8760 hours. HbA1C: No results for input(s): HGBA1C in the last 72 hours. CBG: No results for input(s): GLUCAP in the last 168 hours. Lipid Profile: Recent Labs    06/18/19 0004  TRIG 116   Thyroid Function Tests: No results for input(s): TSH, T4TOTAL, FREET4, T3FREE, THYROIDAB in the last 72 hours. Anemia Panel: Recent Labs    06/18/19 0004  FERRITIN 10*   Urine analysis:    Component Value Date/Time   COLORURINE YELLOW 06/23/2017 0928   APPEARANCEUR CLEAR 06/23/2017 0928   LABSPEC 1.021 06/23/2017 0928   PHURINE 5.0 06/23/2017 0928   GLUCOSEU NEGATIVE 06/23/2017 0928   HGBUR NEGATIVE 06/23/2017 0928   HGBUR negative 01/13/2010 1056   BILIRUBINUR NEGATIVE 06/23/2017 0928   BILIRUBINUR negative 02/11/2017 1709   KETONESUR NEGATIVE 06/23/2017 0928   PROTEINUR NEGATIVE 06/23/2017 0928   UROBILINOGEN 0.2 02/11/2017 1709   UROBILINOGEN 0.2 01/13/2010 1056   NITRITE NEGATIVE 06/23/2017 0928   LEUKOCYTESUR NEGATIVE 06/23/2017 0928    Radiological Exams on Admission: DG Chest Port 1 View  Result Date: 06/17/2019 CLINICAL DATA:  Shortness of breath and chest pain EXAM: PORTABLE CHEST 1 VIEW COMPARISON:  08/11/2018 FINDINGS: Cardiac shadow is stable. The lungs are clear bilaterally. Aortic calcifications are noted. No bony abnormality is noted. IMPRESSION: No acute abnormality noted. Aortic Atherosclerosis (ICD10-I70.0). Electronically Signed   By: Inez Catalina M.D.   On: 06/17/2019 23:35    EKG: Independently reviewed.  Assessment/Plan Principal Problem:   Acute respiratory failure with hypoxia (HCC) Active Problems:   Essential hypertension   Chronic diastolic CHF (congestive heart failure) (HCC)   CKD (chronic kidney disease) stage 3, GFR 30-59 ml/min   History of pulmonary embolism   SIRS (systemic inflammatory response syndrome) (HCC)   Suspected COVID-19 virus infection    1. Acute resp  failure with hypoxia also with SIRS - 1. DDx: CAP vs COVID vs PE vs some combination of the 3 2. Plan: 1. CTA chest PE 2. Empiric treatment of CAP as I cant rule this out with WBC  18k, SIRS, and a positive procalcitonin.  So starting rocephin / azithro for the moment. 3. RVP pending 4. COVID PCR pending 1. Will need steroids and remdesivir ordered if positive 5. Tele monitor and cont pulse ox 2. HTN - 1. Continue maxide 3. Chronic diastolic CHF - 1. 'Gently hydrate' for the lactate of 2.5 by holding her home lasix dose for today. 2. May want to resume lasix tomorrow depending on how she does. 4. CKD stage 3 - 1. Looks like baseline creat is just around 1.0-1.1, so not far off at 1.2 today.  DVT prophylaxis: Lovenox per pharm consult (either DVT ppx dose, COVID ppx dose for D dimer of 10, or PE treatment dose; depending on what the CTA and/or COVID PCR result as). Code Status: Full Family Communication: No family in room Disposition Plan: Home after admit Consults called: none Admission status: Admit to inpatient  Severity of Illness: The appropriate patient status for this patient is INPATIENT. Inpatient status is judged to be reasonable and necessary in order to provide the required intensity of service to ensure the patient's safety. The patient's presenting symptoms, physical exam findings, and initial radiographic and laboratory data in the context of their chronic comorbidities is felt to place them at high risk for further clinical deterioration. Furthermore, it is not anticipated that the patient will be medically stable for discharge from the hospital within 2 midnights of admission. The following factors support the patient status of inpatient.   IP status due to new O2 requirement with SIRS.  Suspect she either has CAP, COVID, and/or PE.   * I certify that at the point of admission it is my clinical judgment that the patient will require inpatient hospital care spanning beyond  2 midnights from the point of admission due to high intensity of service, high risk for further deterioration and high frequency of surveillance required.*    Chelsea Keller M. DO [facility]  How to contact the [facility] Attending or Consulting provider [place] or covering provider during after hours [place], for this patient?  1. Check the care team in [facility] and look for a) attending/consulting [facility] provider listed and b) the [facility] team listed 2. Log into [email or web address]  Amion Physician Scheduling and messaging for groups and whole hospitals  On call and physician scheduling software for group practices, residents, hospitalists and other medical providers for call, clinic, rotation and shift schedules. OnCall [employer] is a hospital-wide system for scheduling doctors and paging doctors on call. EasyPlot is for scientific plotting and data analysis.  [email or web address]  and use [place]'s universal password to access. If you do not have the password, please contact the hospital operator.  3. Locate the [facility] provider you are looking for under [facility] and page to a number that you can be directly reached. 4. If you still have difficulty reaching the provider, please page the [facility] (Director on Call) for the Hospitalists listed on amion for assistance.  06/18/2019, 3:45 AM

## 2019-06-18 NOTE — Progress Notes (Signed)
ANTICOAGULATION CONSULT NOTE - Initial Consult  Pharmacy Consult for Lovenox Indication: pulmonary embolus  Allergies  Allergen Reactions  . Codeine Sulfate Shortness Of Breath  . Celecoxib Other (See Comments)    Caused vaginal bleeding  . Erythromycin Base Nausea And Vomiting    Patient Measurements:   Heparin Dosing Weight: 120 kg  Vital Signs: Temp: 100.1 F (37.8 C) (12/13 0054) Temp Source: Oral (12/13 0054) BP: 117/58 (12/13 0441) Pulse Rate: 87 (12/13 0442)  Labs: Recent Labs    06/17/19 2333  HGB 10.1*  HCT 33.3*  PLT 308  LABPROT 13.6  INR 1.1  CREATININE 1.23*    CrCl cannot be calculated (Unknown ideal weight.).   Medical History: Past Medical History:  Diagnosis Date  . Allergy   . Anemia   . Anxiety   . Arthritis   . Arthritis of sacroiliac joint of both sides 11/12/2017  . Bilateral pulmonary embolism ([place]) 06/23/2017  . Chronic diastolic CHF (congestive heart failure) (HCC)    a. Echo 1/16:  mild LVH, EF normal, grade 1 DD, MAC  . Chronic venous insufficiency    chronic LE edema  . DDD (degenerative disc disease), lumbar 11/12/2017  . Degenerative joint disease (DJD) of hip, Bilateral  11/12/2017  . Depression   . Fibromyalgia    constant pain  . Hx of cardiac catheterization    a. LHC in [place] "ok" per patient with mild plaque in a single vessel - records not available  . Hx of cardiovascular stress test    a. Nuclear study in 2008 normal  . Hx of colonic polyps   . Hypertension   . Hypertriglyceridemia   . Impaired fasting glucose   . PONV (postoperative nausea and vomiting)   . PUD (peptic ulcer disease)    hx of gastric ulcer  . Pulmonary emboli ([place]) 06/2017  . Vitamin B12 deficiency     Medications:  Scheduled:  . albuterol  2 puff Inhalation Q6H  . aspirin EC  81 mg Oral Daily  . chlorpheniramine-HYDROcodone  5 mL Oral Once  . gabapentin  300 mg Oral Daily  . gabapentin  600 mg Oral QHS  . potassium chloride SA  40 mEq  Oral Daily  . sodium chloride (PF)      . triamterene-hydrochlorothiazide  1 tablet Oral Daily   Infusions:  . azithromycin    . cefTRIAXone (ROCEPHIN)  IV 2 g (06/18/19 0519)    Assessment: 74 yoF with fever, SOB, cough and nausea. Found to have bilateral PEs of moderate clot burden with No right heart strain.   Baseline labs: H/H=10.1/33.3, plts = 300, INR = 1.1, aptt pending  Goal of Therapy:  Anti-Xa level 0.6-1 units/ml 4hrs after LMWH dose given    Plan:  Baseline aptt and wt STAT Lovenox 120 mg sq q12h F/u scr   Lawana Pai R 06/18/2019,5:33 AM

## 2019-06-18 NOTE — ED Notes (Signed)
Called daughter, Reuben Likes, per patient's request to let her know that Covid was negative. Informed still waiting for bed assignment.

## 2019-06-18 NOTE — ED Notes (Signed)
hospitalist at bedside

## 2019-06-19 ENCOUNTER — Inpatient Hospital Stay (HOSPITAL_COMMUNITY): Payer: Medicare Other

## 2019-06-19 DIAGNOSIS — I2694 Multiple subsegmental pulmonary emboli without acute cor pulmonale: Secondary | ICD-10-CM

## 2019-06-19 DIAGNOSIS — I340 Nonrheumatic mitral (valve) insufficiency: Secondary | ICD-10-CM

## 2019-06-19 DIAGNOSIS — I10 Essential (primary) hypertension: Secondary | ICD-10-CM

## 2019-06-19 DIAGNOSIS — I361 Nonrheumatic tricuspid (valve) insufficiency: Secondary | ICD-10-CM

## 2019-06-19 DIAGNOSIS — N1831 Chronic kidney disease, stage 3a: Secondary | ICD-10-CM

## 2019-06-19 LAB — BLOOD CULTURE ID PANEL (REFLEXED)

## 2019-06-19 LAB — CBC WITH DIFFERENTIAL/PLATELET
Abs Immature Granulocytes: 0.15 10*3/uL — ABNORMAL HIGH (ref 0.00–0.07)
Basophils Absolute: 0.1 10*3/uL (ref 0.0–0.1)
Basophils Relative: 0 %
Eosinophils Absolute: 0.3 10*3/uL (ref 0.0–0.5)
Eosinophils Relative: 2 %
HCT: 29.4 % — ABNORMAL LOW (ref 36.0–46.0)
Hemoglobin: 8.6 g/dL — ABNORMAL LOW (ref 12.0–15.0)
Immature Granulocytes: 1 %
Lymphocytes Relative: 10 %
Lymphs Abs: 1.4 10*3/uL (ref 0.7–4.0)
MCH: 26.7 pg (ref 26.0–34.0)
MCHC: 29.3 g/dL — ABNORMAL LOW (ref 30.0–36.0)
MCV: 91.3 fL (ref 80.0–100.0)
Monocytes Absolute: 1.2 10*3/uL — ABNORMAL HIGH (ref 0.1–1.0)
Monocytes Relative: 8 %
Neutro Abs: 11.8 10*3/uL — ABNORMAL HIGH (ref 1.7–7.7)
Neutrophils Relative %: 79 %
Platelets: 289 10*3/uL (ref 150–400)
RBC: 3.22 MIL/uL — ABNORMAL LOW (ref 3.87–5.11)
RDW: 17 % — ABNORMAL HIGH (ref 11.5–15.5)
WBC: 14.9 10*3/uL — ABNORMAL HIGH (ref 4.0–10.5)
nRBC: 0 % (ref 0.0–0.2)

## 2019-06-19 LAB — COMPREHENSIVE METABOLIC PANEL
ALT: 18 U/L (ref 0–44)
AST: 37 U/L (ref 15–41)
Albumin: 2.7 g/dL — ABNORMAL LOW (ref 3.5–5.0)
Alkaline Phosphatase: 82 U/L (ref 38–126)
Anion gap: 9 (ref 5–15)
BUN: 27 mg/dL — ABNORMAL HIGH (ref 8–23)
CO2: 28 mmol/L (ref 22–32)
Calcium: 8.2 mg/dL — ABNORMAL LOW (ref 8.9–10.3)
Chloride: 102 mmol/L (ref 98–111)
Creatinine, Ser: 1.14 mg/dL — ABNORMAL HIGH (ref 0.44–1.00)
GFR calc Af Amer: 52 mL/min — ABNORMAL LOW (ref >60)
GFR calc non Af Amer: 45 mL/min — ABNORMAL LOW (ref >60)
Glucose, Bld: 101 mg/dL — ABNORMAL HIGH (ref 70–99)
Potassium: 3.8 mmol/L (ref 3.5–5.1)
Sodium: 139 mmol/L (ref 135–145)
Total Bilirubin: 1 mg/dL (ref 0.3–1.2)
Total Protein: 5.3 g/dL — ABNORMAL LOW (ref 6.5–8.1)

## 2019-06-19 LAB — APTT: aPTT: 52 seconds — ABNORMAL HIGH (ref 24–36)

## 2019-06-19 LAB — D-DIMER, QUANTITATIVE: D-Dimer, Quant: 4.88 ug/mL-FEU — ABNORMAL HIGH (ref 0.00–0.50)

## 2019-06-19 LAB — HIV ANTIBODY (ROUTINE TESTING W REFLEX): HIV Screen 4th Generation wRfx: NONREACTIVE

## 2019-06-19 LAB — ECHOCARDIOGRAM COMPLETE
Height: 62 in
Weight: 4240 oz

## 2019-06-19 LAB — MRSA PCR SCREENING: MRSA by PCR: NEGATIVE

## 2019-06-19 LAB — C-REACTIVE PROTEIN: CRP: 16.8 mg/dL — ABNORMAL HIGH (ref <1.0)

## 2019-06-19 LAB — PROCALCITONIN: Procalcitonin: 6.62 ng/mL

## 2019-06-19 LAB — LACTIC ACID, PLASMA: Lactic Acid, Venous: 1.6 mmol/L (ref 0.5–1.9)

## 2019-06-19 MED ORDER — SODIUM CHLORIDE 0.9 % IV SOLN
2.0000 g | Freq: Every day | INTRAVENOUS | Status: DC
  Administered 2019-06-20 – 2019-06-21 (×2): 2 g via INTRAVENOUS
  Filled 2019-06-19: qty 2
  Filled 2019-06-19: qty 20
  Filled 2019-06-19: qty 2

## 2019-06-19 MED ORDER — PERFLUTREN LIPID MICROSPHERE
1.0000 mL | INTRAVENOUS | Status: AC | PRN
  Administered 2019-06-19: 1 mL via INTRAVENOUS
  Filled 2019-06-19: qty 10

## 2019-06-19 MED ORDER — SODIUM CHLORIDE 0.9 % IV SOLN: 2.0000 g | Freq: Every day | INTRAVENOUS | Status: DC

## 2019-06-19 NOTE — Progress Notes (Signed)
PHARMACY - PHYSICIAN COMMUNICATION CRITICAL VALUE ALERT - BLOOD CULTURE IDENTIFICATION (BCID)  Chelsea Keller is an 84 y.o. female who presented to [facility] on 06/17/2019 with a chief complaint of fever, SOB, nausea, cough.  Assessment:  Pt currently on antibiotics for PNA. BCID + 3/4 GPC, streptococcus species.   Name of physician (or Provider) Contacted: Dr. Horris Latino, text page.  Current antibiotics: Ceftriaxone 2 g IV q24h + azithromycin 500 mg IV q24h  Changes to prescribed antibiotics recommended: No change to antibiotics at this time, CTX is recommended ABX. Recommended removing current stop date of 12/18 on CTX.  Results for orders placed or performed during the hospital encounter of 06/17/19  Blood Culture ID Panel (Reflexed) (Collected: 06/17/2019 11:34 PM)  Result Value Ref Range   Enterococcus species NOT DETECTED NOT DETECTED   Listeria monocytogenes NOT DETECTED NOT DETECTED   Staphylococcus species NOT DETECTED NOT DETECTED   Staphylococcus aureus (BCID) NOT DETECTED NOT DETECTED   Streptococcus species DETECTED (A) NOT DETECTED   Streptococcus agalactiae NOT DETECTED NOT DETECTED   Streptococcus pneumoniae NOT DETECTED NOT DETECTED   Streptococcus pyogenes NOT DETECTED NOT DETECTED   Acinetobacter baumannii NOT DETECTED NOT DETECTED   Enterobacteriaceae species NOT DETECTED NOT DETECTED   Enterobacter cloacae complex NOT DETECTED NOT DETECTED   Escherichia coli NOT DETECTED NOT DETECTED   Klebsiella oxytoca NOT DETECTED NOT DETECTED   Klebsiella pneumoniae NOT DETECTED NOT DETECTED   Proteus species NOT DETECTED NOT DETECTED   Serratia marcescens NOT DETECTED NOT DETECTED   Haemophilus influenzae NOT DETECTED NOT DETECTED   Neisseria meningitidis NOT DETECTED NOT DETECTED   Pseudomonas aeruginosa NOT DETECTED NOT DETECTED   Candida albicans NOT DETECTED NOT DETECTED   Candida glabrata NOT DETECTED NOT DETECTED   Candida krusei NOT DETECTED NOT DETECTED   Candida  parapsilosis NOT DETECTED NOT DETECTED   Candida tropicalis NOT DETECTED NOT DETECTED    Lenis Noon, PharmD 06/19/2019  8:58 AM

## 2019-06-19 NOTE — Progress Notes (Signed)
Echocardiogram 2D Echocardiogram has been performed.  Oneal Deputy Jevonte Clanton 06/19/2019, 2:00 PM

## 2019-06-19 NOTE — Progress Notes (Signed)
PROGRESS NOTE  Chelsea Keller [id number] DOB: 09-13-1934 DOA: 06/17/2019 PCP: Venia Carbon, MD  HPI/Recap of past 24 hours: HPI from Dr Werner Lean is a 84 y.o. female with medical history significant for previous PE s/p xarelto over a year ago, chronic diastolic CHF, chronic BLE edema, presents to ED with fever, SOB, cough, nausea, progressively worsening for a few days. EMS called, O2 sat in the 60s. In the ED, sats in the low 90s on 3L via [place]. Labs showed WBC 18k, K 3.2, procalcitonin 1.09, lactate 2.5, D-dimer 10.41, Covid antigen negative, PCR pending.  Vital signs on admission showed Tm 101.9, hr 112, RR 30.  Chest x-ray unremarkable.  Patient admitted for further management     Today, pt still with generalized weakness, SOB, denies any chest pain, abdominal pain, fever/chills, N/V/D.    Assessment/Plan: Principal Problem:   Acute respiratory failure with hypoxia (HCC) Active Problems:   Essential hypertension   Chronic diastolic CHF (congestive heart failure) (HCC)   CKD (chronic kidney disease) stage 3, GFR 30-59 ml/min   History of pulmonary embolism   SIRS (systemic inflammatory response syndrome) (HCC)   Suspected COVID-19 virus infection   Pulmonary embolism (HCC)   Acute hypoxic respiratory failure likely 2/2 bilateral PE Currently afebrile, with leukocytosis Currently requiring about 2L of O2 CRP 3.2, ferritin 10 D-dimer elevated at 10.41 Chest x-ray unremarkable EKG with no acute ST changes, sinus tachycardia CTA chest showed bilateral PE of moderate clot burden, no right ventricular strain, no pulmonary infarction or pneumonia Echo showed EF of 60-65%, grade 1DD Started on therapeutic Lovenox, continue Supplemental oxygen prn Monitor closely, telemetry, continuous pulse ox  BLE edema/erythema r/o DVT vs cellulitis B/L venous doppler pending MRSA PCR negative Continue lovenox, ceftriaxone  Sepsis likely 2/2 streptococcus  bacteremia Unknown etiology Febrile, tachycardic, tachypneic on admission  Lactic acidosis, resolved Procalcitonin 1.09--> trended up [facility] x2 grew 3/4 GPC, streptococcus sp Chest x-ray/CT chest unremarkable for pneumonia UA showed positive nitrite, rare bacteria, UC pending Continue ceftriaxone for now pending final cx report  CKD stage III Last creatinine on 12/2018 was 0.93 Currently at baseline Daily BMP  Anemia of CKD Hemoglobin around baseline, slight drop today Denies any obvious signs of bleeding Daily CBC  Hypertension BP stable Hold home triamterene-hydrochlorothiazide  Chronic diastolic HF Chronic BLE edema We will hold off Lasix for now Strict I's and O's, daily weights  Morbid obesity Advised lifestyle modification        Malnutrition Type:      Malnutrition Characteristics:      Nutrition Interventions:       Estimated body mass index is 48.47 kg/m as calculated from the following:   Height as of this encounter: 5\' 2"  (1.575 m).   Weight as of this encounter: 120.2 kg.     Code Status: Full  Family Communication: None at bedside  Disposition Plan: To be determined   Consultants:  None  Procedures:  None  Antimicrobials:  Ceftriaxone  DVT prophylaxis: Therapeutic Lovenox   Objective: Vitals:   06/19/19 0459 06/19/19 0901 06/19/19 1411 06/19/19 1455  BP: 139/63   (!) 129/51  Pulse: 87   89  Resp: 20   19  Temp: 98.2 F (36.8 C)   98.6 F (37 C)  TempSrc:      SpO2: 99% 90% 98% 99%  Weight:      Height:        Intake/Output Summary (Last 24 hours) at 06/19/2019 1900 Last data  filed at 06/19/2019 1843 Gross per 24 hour  Intake 1320 ml  Output 1300 ml  Net 20 ml   Filed Weights   06/18/19 0600 06/18/19 0605  Weight: 120.2 kg 120.2 kg    Exam:  General: NAD, chronically ill-appearing, deconditioned    Cardiovascular: S1, S2 present  Respiratory: Diminished BS bilaterally  Abdomen: Soft, nontender,  nondistended, bowel sounds present  Musculoskeletal: 1+ bilateral pedal edema noted  Skin: RLE erythema, warm, edema, with BLE chronic venous changes  Psychiatry: Normal mood   Data Reviewed: CBC: Recent Labs  Lab 06/17/19 2333 06/19/19 0530  WBC 18.3* 14.9*  NEUTROABS 17.1* 11.8*  HGB 10.1* 8.6*  HCT 33.3* 29.4*  MCV 89.5 91.3  PLT 308 [id number]   Basic Metabolic Panel: Recent Labs  Lab 06/17/19 2333 06/18/19 1331 06/19/19 0530  NA 141 139 139  K 3.2* 3.9 3.8  CL 102 100 102  CO2 27 28 28   GLUCOSE 122* 123* 101*  BUN 25* 30* 27*  CREATININE 1.23* 1.47* 1.14*  CALCIUM 9.0 8.4* 8.2*   GFR: Estimated Creatinine Clearance: 46.9 mL/min (A) (by C-G formula based on SCr of 1.14 mg/dL (H)). Liver Function Tests: Recent Labs  Lab 06/17/19 2333 06/18/19 1331 06/19/19 0530  AST 24 34 37  ALT 16 20 18   ALKPHOS 99 95 82  BILITOT 1.5* 1.0 1.0  PROT 6.4* 6.0* 5.3*  ALBUMIN 3.5 3.1* 2.7*   No results for input(s): LIPASE, AMYLASE in the last 168 hours. No results for input(s): AMMONIA in the last 168 hours. Coagulation Profile: Recent Labs  Lab 06/17/19 2333  INR 1.1   Cardiac Enzymes: No results for input(s): CKTOTAL, CKMB, CKMBINDEX, TROPONINI in the last 168 hours. BNP (last 3 results) No results for input(s): PROBNP in the last 8760 hours. HbA1C: No results for input(s): HGBA1C in the last 72 hours. CBG: No results for input(s): GLUCAP in the last 168 hours. Lipid Profile: Recent Labs    06/18/19 0004  TRIG 116   Thyroid Function Tests: No results for input(s): TSH, T4TOTAL, FREET4, T3FREE, THYROIDAB in the last 72 hours. Anemia Panel: Recent Labs    06/18/19 0004  FERRITIN 10*   Urine analysis:    Component Value Date/Time   COLORURINE YELLOW 06/18/2019 0515   APPEARANCEUR HAZY (A) 06/18/2019 0515   LABSPEC 1.016 06/18/2019 0515   PHURINE 7.0 06/18/2019 0515   GLUCOSEU NEGATIVE 06/18/2019 0515   HGBUR NEGATIVE 06/18/2019 0515   HGBUR negative  01/13/2010 1056   BILIRUBINUR NEGATIVE 06/18/2019 0515   BILIRUBINUR negative 02/11/2017 1709   KETONESUR NEGATIVE 06/18/2019 0515   PROTEINUR NEGATIVE 06/18/2019 0515   UROBILINOGEN 0.2 02/11/2017 1709   UROBILINOGEN 0.2 01/13/2010 1056   NITRITE POSITIVE (A) 06/18/2019 0515   LEUKOCYTESUR NEGATIVE 06/18/2019 0515   Sepsis Labs: @LABRCNTIP (procalcitonin:4,lacticidven:4)  ) Recent Results (from the past 240 hour(s))  Culture, blood (Routine x 2)     Status: None (Preliminary result)   Collection Time: 06/17/19 11:34 PM   Specimen: BLOOD LEFT FOREARM  Result Value Ref Range Status   Specimen Description   Final    BLOOD LEFT FOREARM Performed at [facility], [place] [address] [place], [place] [address]    Special Requests   Final    BOTTLES DRAWN AEROBIC AND ANAEROBIC Blood Culture results may not be optimal due to an excessive volume of blood received in culture bottles Performed at [place] [facility] [address] [place], [place] [address]    Culture  Setup Time   Final    GRAM POSITIVE COCCI IN BOTH AEROBIC AND ANAEROBIC BOTTLES CRITICAL VALUE NOTED.  VALUE IS CONSISTENT WITH PREVIOUSLY REPORTED AND CALLED VALUE.    Culture   Final    NO GROWTH 1 DAY Performed at [facility], [place] [address] [place], [place] [address]    Report Status PENDING  Incomplete  Culture, blood (Routine x 2)     Status: None (Preliminary result)   Collection Time: 06/17/19 11:34 PM   Specimen: BLOOD  Result Value Ref Range Status   Specimen Description   Final    BLOOD RIGHT ANTECUBITAL Performed at [facility], [place] [address] [place], [place] [address]    Special Requests   Final    BOTTLES DRAWN AEROBIC AND ANAEROBIC Blood Culture adequate volume Performed at [place] [address] [place], [place] [address]    Culture  Setup Time   Final    AEROBIC BOTTLE ONLY GRAM POSITIVE COCCI CRITICAL RESULT CALLED TO, READ BACK BY AND  VERIFIED WITH: PHRMD M RENZ @0710  06/19/19 BY S GEZAHEGN Performed at [facility], [address], [place], [place] [address]    Culture GRAM POSITIVE COCCI  Final   Report Status PENDING  Incomplete  Blood Culture ID Panel (Reflexed)     Status: Abnormal   Collection Time: 06/17/19 11:34 PM  Result Value Ref Range Status   Enterococcus species NOT DETECTED NOT DETECTED Final   Listeria monocytogenes NOT DETECTED NOT DETECTED Final   Staphylococcus species NOT DETECTED NOT DETECTED Final   Staphylococcus aureus (BCID) NOT DETECTED NOT DETECTED Final   Streptococcus species DETECTED (A) NOT DETECTED Final    Comment: Not Enterococcus species, Streptococcus agalactiae, Streptococcus pyogenes, or Streptococcus pneumoniae. CRITICAL RESULT CALLED TO, READ BACK BY AND VERIFIED WITH: PHRMD M RENZ @0710  06/19/19 BY S GEZAHEGN    Streptococcus agalactiae NOT DETECTED NOT DETECTED Final   Streptococcus pneumoniae NOT DETECTED NOT DETECTED Final   Streptococcus pyogenes NOT DETECTED NOT DETECTED Final   Acinetobacter baumannii NOT DETECTED NOT DETECTED Final   Enterobacteriaceae species NOT DETECTED NOT DETECTED Final   Enterobacter cloacae complex NOT DETECTED NOT DETECTED Final   Escherichia coli NOT DETECTED NOT DETECTED Final   Klebsiella oxytoca NOT DETECTED NOT DETECTED Final   Klebsiella pneumoniae NOT DETECTED NOT DETECTED Final   Proteus species NOT DETECTED NOT DETECTED Final   Serratia marcescens NOT DETECTED NOT DETECTED Final   Haemophilus influenzae NOT DETECTED NOT DETECTED Final   Neisseria meningitidis NOT DETECTED NOT DETECTED Final   Pseudomonas aeruginosa NOT DETECTED NOT DETECTED Final   Candida albicans NOT DETECTED NOT DETECTED Final   Candida glabrata NOT DETECTED NOT DETECTED Final   Candida krusei NOT DETECTED NOT DETECTED Final   Candida parapsilosis NOT DETECTED NOT DETECTED Final   Candida tropicalis NOT DETECTED NOT DETECTED Final    Comment: Performed at  [facility], [place] [address] [place], [place] [address]  SARS CORONAVIRUS 2 (TAT 6-24 HRS) Nasopharyngeal Nasopharyngeal Swab     Status: None   Collection Time: 06/18/19  1:01 AM   Specimen: Nasopharyngeal Swab  Result Value Ref Range Status   SARS Coronavirus 2 NEGATIVE NEGATIVE Final    Comment: (NOTE) SARS-CoV-2 target nucleic acids are NOT DETECTED. The SARS-CoV-2 RNA is generally detectable in upper and lower respiratory specimens during the acute phase of infection. Negative results do not preclude SARS-CoV-2 infection, do not rule out  co-infections with other pathogens, and should not be used as the sole basis for treatment or other patient management decisions. Negative results must be combined with clinical observations, patient history, and epidemiological information. The expected result is Negative. Fact Sheet for Patients: SugarRoll.be Fact Sheet for Healthcare Providers: https://www.woods-mathews.com/ This test is not yet approved or cleared by the [place] FDA and  has been authorized for detection and/or diagnosis of SARS-CoV-2 by FDA under an Emergency Use Authorization (EUA). This EUA will remain  in effect (meaning this test can be used) for the duration of the COVID-19 declaration under Section 56 4(b)(1) of the Act, 21 U.S.C. section 360bbb-3(b)(1), unless the authorization is terminated or revoked sooner. Performed at [facility], [place] [address], [place], [place] [address]   MRSA PCR Screening     Status: None   Collection Time: 06/19/19  7:40 AM   Specimen: Nasal Mucosa; Nasopharyngeal  Result Value Ref Range Status   MRSA by PCR NEGATIVE NEGATIVE Final    Comment:        The GeneXpert MRSA Assay (FDA approved for NASAL specimens only), is one component of a comprehensive MRSA colonization surveillance program. It is not intended to diagnose MRSA infection nor to guide or monitor treatment  for MRSA infections. Performed at [facility], [place] [address], [place], [place] [address]       Studies: ECHOCARDIOGRAM COMPLETE  Result Date: 06/19/2019   ECHOCARDIOGRAM REPORT   Patient Name:   JESIE STENERSON Date of Exam: 06/19/2019 Medical Rec #:  [id number]     Height:       62.0 in Accession #:    [id number]    Weight:       265.0 lb Date of Birth:  08/26/1934     BSA:          2.15 m Patient Age:    57 years      BP:           139/63 mmHg Patient Gender: F             HR:           88 bpm. Exam Location:  Inpatient Procedure: 2D Echo, Color Doppler, Cardiac Doppler and Intracardiac            Opacification Agent Indications:    I26.02 Pulmonary embolus  History:        Patient has prior history of Echocardiogram examinations, most                 recent 06/23/2017. CHF; Risk Factors:Hypertension.  Sonographer:    Raquel Sarna Senior RDCS Referring Phys: [id number] [place]  1. Left ventricular ejection fraction, by visual estimation, is 60 to 65%. The left ventricle has normal function. There is mildly increased left ventricular hypertrophy.  2. Left ventricular diastolic parameters are consistent with Grade I diastolic dysfunction (impaired relaxation).  3. The left ventricle has no regional wall motion abnormalities.  4. Global right ventricle has normal systolic function.The right ventricular size is normal. No increase in right ventricular wall thickness.  5. Left atrial size was normal.  6. Right atrial size was normal.  7. Mild mitral annular calcification.  8. The mitral valve is abnormal. Mild mitral valve regurgitation.  9. The tricuspid valve is normal in structure. Tricuspid valve regurgitation is mild. 10. The aortic valve is grossly normal. Aortic valve regurgitation is not visualized. 11. The pulmonic valve was normal in structure. Pulmonic valve  regurgitation is not visualized. 12. Mildly elevated pulmonary artery systolic pressure. FINDINGS  Left  Ventricle: Left ventricular ejection fraction, by visual estimation, is 60 to 65%. The left ventricle has normal function. The left ventricle has no regional wall motion abnormalities. There is mildly increased left ventricular hypertrophy. Left ventricular diastolic parameters are consistent with Grade I diastolic dysfunction (impaired relaxation). Right Ventricle: The right ventricular size is normal. No increase in right ventricular wall thickness. Global RV systolic function is has normal systolic function. The tricuspid regurgitant velocity is 2.97 m/s, and with an assumed right atrial pressure  of 3 mmHg, the estimated right ventricular systolic pressure is mildly elevated at 38.3 mmHg. Left Atrium: Left atrial size was normal in size. Right Atrium: Right atrial size was normal in size Pericardium: There is no evidence of pericardial effusion. Mitral Valve: The mitral valve is abnormal. Mild mitral annular calcification. Mild mitral valve regurgitation. MV peak gradient, 12.8 mmHg. Tricuspid Valve: The tricuspid valve is normal in structure. Tricuspid valve regurgitation is mild. Aortic Valve: The aortic valve is grossly normal. Aortic valve regurgitation is not visualized. Pulmonic Valve: The pulmonic valve was normal in structure. Pulmonic valve regurgitation is not visualized. Pulmonic regurgitation is not visualized. Aorta: The aortic root is normal in size and structure. IAS/Shunts: No atrial level shunt detected by color flow Doppler.  LEFT VENTRICLE PLAX 2D LVIDd:         4.60 cm  Diastology LVIDs:         2.90 cm  LV e' lateral:   6.64 cm/s LV PW:         1.10 cm  LV E/e' lateral: 20.5 LV IVS:        1.20 cm  LV e' medial:    8.16 cm/s LVOT diam:     1.80 cm  LV E/e' medial:  16.7 LV SV:         65 ml LV SV Index:   27.49 LVOT Area:     2.54 cm  RIGHT VENTRICLE RV S prime:     15.05 cm/s TAPSE (M-mode): 2.5 cm LEFT ATRIUM             Index       RIGHT ATRIUM           Index LA diam:        2.90 cm  1.35 cm/m  RA Area:     13.40 cm LA Vol (A2C):   82.4 ml 38.24 ml/m RA Volume:   28.10 ml  13.04 ml/m LA Vol (A4C):   53.7 ml 24.92 ml/m LA Biplane Vol: 69.3 ml 32.16 ml/m  AORTIC VALVE LVOT Vmax:   120.00 cm/s LVOT Vmean:  81.000 cm/s LVOT VTI:    0.222 m  AORTA Ao Root diam: 2.70 cm Ao Asc diam:  2.90 cm MITRAL VALVE                        TRICUSPID VALVE MV Area (PHT): 4.57 cm             TR Peak grad:   35.3 mmHg MV Peak grad:  12.8 mmHg            TR Vmax:        297.00 cm/s MV Mean grad:  7.0 mmHg MV Vmax:       1.79 m/s             SHUNTS MV Vmean:  123.0 cm/s           Systemic VTI:  0.22 m MV VTI:        0.34 m               Systemic Diam: 1.80 cm MV PHT:        48.14 msec MV Decel Time: 166 msec MV E velocity: 136.00 cm/s 103 cm/s  Dorris Carnes MD Electronically signed by Dorris Carnes MD Signature Date/Time: 06/19/2019/3:58:45 PM    Final     Scheduled Meds: . albuterol  2 puff Inhalation TID  . aspirin EC  81 mg Oral Daily  . chlorpheniramine-HYDROcodone  5 mL Oral Once  . enoxaparin (LOVENOX) injection  120 mg Subcutaneous BID  . gabapentin  300 mg Oral Daily  . gabapentin  600 mg Oral QHS    Continuous Infusions: . azithromycin Stopped (06/19/19 0947)  . [START ON 06/20/2019] cefTRIAXone (ROCEPHIN)  IV       LOS: 1 day     Alma Friendly, MD [facility]  If 7PM-7AM, please contact night-coverage [email or web address] 06/19/2019, 7:00 PM

## 2019-06-19 NOTE — Plan of Care (Signed)

## 2019-06-20 ENCOUNTER — Telehealth: Payer: Self-pay | Admitting: *Deleted

## 2019-06-20 ENCOUNTER — Inpatient Hospital Stay (HOSPITAL_COMMUNITY): Payer: Medicare Other

## 2019-06-20 DIAGNOSIS — Z87891 Personal history of nicotine dependence: Secondary | ICD-10-CM

## 2019-06-20 DIAGNOSIS — I5032 Chronic diastolic (congestive) heart failure: Secondary | ICD-10-CM

## 2019-06-20 DIAGNOSIS — J9601 Acute respiratory failure with hypoxia: Secondary | ICD-10-CM

## 2019-06-20 DIAGNOSIS — I82409 Acute embolism and thrombosis of unspecified deep veins of unspecified lower extremity: Secondary | ICD-10-CM

## 2019-06-20 DIAGNOSIS — R7881 Bacteremia: Secondary | ICD-10-CM

## 2019-06-20 DIAGNOSIS — Z888 Allergy status to other drugs, medicaments and biological substances status: Secondary | ICD-10-CM

## 2019-06-20 DIAGNOSIS — L03818 Cellulitis of other sites: Secondary | ICD-10-CM

## 2019-06-20 DIAGNOSIS — Z885 Allergy status to narcotic agent status: Secondary | ICD-10-CM

## 2019-06-20 DIAGNOSIS — Z86711 Personal history of pulmonary embolism: Secondary | ICD-10-CM

## 2019-06-20 DIAGNOSIS — R600 Localized edema: Secondary | ICD-10-CM

## 2019-06-20 DIAGNOSIS — Z881 Allergy status to other antibiotic agents status: Secondary | ICD-10-CM

## 2019-06-20 DIAGNOSIS — B955 Unspecified streptococcus as the cause of diseases classified elsewhere: Secondary | ICD-10-CM

## 2019-06-20 DIAGNOSIS — I2699 Other pulmonary embolism without acute cor pulmonale: Secondary | ICD-10-CM

## 2019-06-20 DIAGNOSIS — D500 Iron deficiency anemia secondary to blood loss (chronic): Secondary | ICD-10-CM

## 2019-06-20 DIAGNOSIS — I130 Hypertensive heart and chronic kidney disease with heart failure and stage 1 through stage 4 chronic kidney disease, or unspecified chronic kidney disease: Secondary | ICD-10-CM

## 2019-06-20 DIAGNOSIS — N183 Chronic kidney disease, stage 3 unspecified: Secondary | ICD-10-CM

## 2019-06-20 LAB — CBC WITH DIFFERENTIAL/PLATELET
Abs Immature Granulocytes: 0.11 10*3/uL — ABNORMAL HIGH (ref 0.00–0.07)
Basophils Absolute: 0 10*3/uL (ref 0.0–0.1)
Basophils Relative: 1 %
Eosinophils Absolute: 0.3 10*3/uL (ref 0.0–0.5)
Eosinophils Relative: 3 %
HCT: 28.7 % — ABNORMAL LOW (ref 36.0–46.0)
Hemoglobin: 8.3 g/dL — ABNORMAL LOW (ref 12.0–15.0)
Immature Granulocytes: 1 %
Lymphocytes Relative: 14 %
Lymphs Abs: 1.2 10*3/uL (ref 0.7–4.0)
MCH: 26.5 pg (ref 26.0–34.0)
MCHC: 28.9 g/dL — ABNORMAL LOW (ref 30.0–36.0)
MCV: 91.7 fL (ref 80.0–100.0)
Monocytes Absolute: 0.7 10*3/uL (ref 0.1–1.0)
Monocytes Relative: 8 %
Neutro Abs: 6.4 10*3/uL (ref 1.7–7.7)
Neutrophils Relative %: 73 %
Platelets: 270 10*3/uL (ref 150–400)
RBC: 3.13 MIL/uL — ABNORMAL LOW (ref 3.87–5.11)
RDW: 17 % — ABNORMAL HIGH (ref 11.5–15.5)
WBC: 8.8 10*3/uL (ref 4.0–10.5)
nRBC: 0 % (ref 0.0–0.2)

## 2019-06-20 LAB — COMPREHENSIVE METABOLIC PANEL
ALT: 17 U/L (ref 0–44)
AST: 26 U/L (ref 15–41)
Albumin: 2.7 g/dL — ABNORMAL LOW (ref 3.5–5.0)
Alkaline Phosphatase: 78 U/L (ref 38–126)
Anion gap: 9 (ref 5–15)
BUN: 27 mg/dL — ABNORMAL HIGH (ref 8–23)
CO2: 27 mmol/L (ref 22–32)
Calcium: 8.2 mg/dL — ABNORMAL LOW (ref 8.9–10.3)
Chloride: 103 mmol/L (ref 98–111)
Creatinine, Ser: 1.01 mg/dL — ABNORMAL HIGH (ref 0.44–1.00)
GFR calc Af Amer: >60 mL/min (ref >60)
GFR calc non Af Amer: 52 mL/min — ABNORMAL LOW (ref >60)
Glucose, Bld: 100 mg/dL — ABNORMAL HIGH (ref 70–99)
Potassium: 3.5 mmol/L (ref 3.5–5.1)
Sodium: 139 mmol/L (ref 135–145)
Total Bilirubin: 0.7 mg/dL (ref 0.3–1.2)
Total Protein: 5.5 g/dL — ABNORMAL LOW (ref 6.5–8.1)

## 2019-06-20 LAB — PROCALCITONIN: Procalcitonin: 3.7 ng/mL

## 2019-06-20 LAB — HEMOGLOBIN AND HEMATOCRIT, BLOOD
HCT: 28.1 % — ABNORMAL LOW (ref 36.0–46.0)
Hemoglobin: 8.2 g/dL — ABNORMAL LOW (ref 12.0–15.0)

## 2019-06-20 LAB — C-REACTIVE PROTEIN: CRP: 10.9 mg/dL — ABNORMAL HIGH (ref <1.0)

## 2019-06-20 MED ORDER — ALBUTEROL SULFATE HFA 108 (90 BASE) MCG/ACT IN AERS: 2.0000 | INHALATION_SPRAY | RESPIRATORY_TRACT | Status: DC | PRN

## 2019-06-20 NOTE — Telephone Encounter (Signed)
Received call from pt daughter wanting to inform MD that pt is hospitalized with "blood clot in both lungs and a blood infection with an unknow source".  Also states "pt hemoglobin continues to drop while in the hospital with no blood transfusion as of yet".  Pt daughter requiting Dr. Lindi Adie be aware of pt hospitalization.  RN will inform MD.

## 2019-06-20 NOTE — Progress Notes (Signed)
PROGRESS NOTE    Chelsea [place]    Code Status: Full Code  [id number] DOB: 04-Nov-1934 DOA: 06/17/2019  PCP: Venia Carbon, MD    [facility] Summary   Chelsea [place] a 84 y.o.femalewith past medical history of symptomatic anemia with history of GI bleed and iron deficiency anemia requiring IV iron and follows with hematology, pulmonary embolism in 2018 previously on Eliquis which was discontinued in March 2020 by her hematologist Dr. Lindi Adie as repeat CT chest was negative and she has history of GI bleeding, chronic diastolic heart failure, chronic bilateral lower extremity edema, who presented to the ED with fever, shortness of breath, cough, nausea which was progressively worsening over a few days.  EMS called, O2 sat in the 60s. In the ED, sats in the low 90s on 3L via [place]. Labs showed WBC 18k, K 3.2, procalcitonin 1.09, lactate 2.5, D-dimer 10.41, Covid negative Vital signs on admission showed Tm 101.9, hr 112, RR 30.  Chest x-ray unremarkable.  Patient admitted for further management on 12/13.  He was found to have acute hypoxic respiratory failure secondary to recurrent bilateral PE without RV strain or dynamic instability and started on therapeutic Lovenox, found to have sepsis secondary to Streptococcus gordonii bacteremia with possible source right lower extremity as she had a recent open wound and now cellulitis. transthoracic echo negative for vegetation.  She has remained afebrile and hemodynamically stable on IV ceftriaxone on room air.   A & P   Principal Problem:   Acute respiratory failure with hypoxia (HCC) Active Problems:   Essential hypertension   Chronic diastolic CHF (congestive heart failure) (HCC)   CKD (chronic kidney disease) stage 3, GFR 30-59 ml/min   History of pulmonary embolism   SIRS (systemic inflammatory response syndrome) (HCC)   Suspected COVID-19 virus infection   Pulmonary embolism (HCC)  Acute hypoxic respiratory failure 2/2 bilateral  PE Currently afebrile and hemodynamically stable on room air CRP 3.2, ferritin 10 D-dimer elevated at 10.41 Chest x-ray unremarkable EKG with no acute ST changes, sinus tachycardia CTA chest showed bilateral PE of moderate clot burden, no right ventricular strain, no pulmonary infarction or pneumonia MRSA PCR negative Echo showed EF of 60-65%, grade 1DD without vegetations -Continue therapeutic Lovenox monitor for GI bleeding and watch H/H as this is continuing to trend down -Supplemental oxygen prn -Monitor closely, telemetry, continuous pulse ox -Consider hematology consult in a.m. (patient's hematologist is Dr. Lindi Adie when she follows with regularly).  Daughter over the phone mentions that hematologist was made aware by her and may send over his PA in the morning.  Confirm this in the morning.  Right lower extremity cellulitis with recent right lower extremity open wound bilateral lower extremity edema Doppler negative DVT Open wound appears healed currently but was reportedly previously draining within the past several weeks possible source of bacteremia? -Continue ceftriaxone  Sepsis secondary to streptococcus gordonii bacteremia Unknown etiology Febrile, tachycardic, tachypneic on admission currently stable on room air  Lactic acidosis, resolved Procalcitonin 1.09--> trended up [facility] x2 grew 3/4 GPC, streptococcus gordonii  Chest x-ray/CT chest unremarkable for pneumonia TTE without vegetations -Continue ceftriaxone -Consider dental work-up outpatient -Tried to reach out to ID today with no response -Follow-up repeat blood cultures, if positive consider TEE  Normocytic anemia, concern for recurrent GI bleed in setting of therapeutic Lovenox History of chronic GI bleed with hemoglobin slowly trending down from admission Colonoscopy 03/23/2018 with multiple nonbleeding colonic angiodysplastic lesions which were treated with APC, diverticulosis in left colon,  terminal hemorrhoids  but otherwise normal. Sees [facility] -Trend H&H -FOBT -Consider GI consult in a.m. -NPO after midnight in case scope  CKD stage III Last creatinine on 12/2018 was 0.93 Currently at baseline -Daily BMP  Anemia of CKD Gets IV iron infusions with hematology though her anemia is likely more from GI bleed as above  Hypertension BP stable Hold home triamterene-hydrochlorothiazide  Chronic diastolic HF Chronic BLE edema We will hold off Lasix for now Strict I's and O's, daily weights  Morbid obesity Advised lifestyle modification  DVT prophylaxis: On therapeutic Lovenox Diet: N.p.o. after midnight Family Communication: Patient's daughter has been updated by phone Disposition Plan: Pending clinical stability  Consultants  None  Procedures  None  Antibiotics   Anti-infectives (From admission, onward)   Start     Dose/Rate Route Frequency Ordered Stop   06/20/19 0600  cefTRIAXone (ROCEPHIN) 2 g in sodium chloride 0.9 % 100 mL IVPB  Status:  Discontinued     2 g 200 mL/hr over 30 Minutes Intravenous Daily 06/19/19 1208 06/19/19 1211   06/20/19 0600  cefTRIAXone (ROCEPHIN) 2 g in sodium chloride 0.9 % 100 mL IVPB     2 g 200 mL/hr over 30 Minutes Intravenous Daily 06/19/19 1211     06/18/19 0500  azithromycin (ZITHROMAX) 500 mg in sodium chloride 0.9 % 250 mL IVPB  Status:  Discontinued     500 mg 250 mL/hr over 60 Minutes Intravenous Daily 06/18/19 0337 06/19/19 1925   06/18/19 0400  cefTRIAXone (ROCEPHIN) 2 g in sodium chloride 0.9 % 100 mL IVPB  Status:  Discontinued     2 g 200 mL/hr over 30 Minutes Intravenous Daily 06/18/19 0337 06/19/19 1208           Subjective   Patient seen and examined at bedside in no acute distress and resting comfortably. No acute events overnight. Denies any acute complaints at this time. Tolerating diet well.  Reports that she did recently have a right lower extremity wound which she only noticed by accidentally scratching her  leg and felt this.  States it was a blister like wound with drainage and has since healed but still feels thickened.  Objective   Vitals:   06/19/19 2056 06/20/19 0506 06/20/19 0812 06/20/19 1246  BP: 140/63 (!) 143/66  (!) 151/58  Pulse: 93 78  88  Resp:      Temp: 98.2 F (36.8 C) 97.8 F (36.6 C)  98.1 F (36.7 C)  TempSrc: Oral   Oral  SpO2: 99% 100% 91% 99%  Weight:      Height:        Intake/Output Summary (Last 24 hours) at 06/20/2019 1812 Last data filed at 06/20/2019 1400 Gross per 24 hour  Intake 580 ml  Output 200 ml  Net 380 ml   Filed Weights   06/18/19 0600 06/18/19 0605  Weight: 120.2 kg 120.2 kg    Examination:  Physical Exam Vitals and nursing note reviewed.  Constitutional:      Appearance: She is obese.  HENT:     Head: Normocephalic and atraumatic.  Eyes:     Extraocular Movements: Extraocular movements intact.  Cardiovascular:     Rate and Rhythm: Normal rate and regular rhythm.  Pulmonary:     Effort: Pulmonary effort is normal. No tachypnea or bradypnea.     Breath sounds: Normal breath sounds.  Abdominal:     General: Bowel sounds are normal.     Palpations: Abdomen is soft. There  is no hepatomegaly.  Musculoskeletal:     Left lower leg: No tenderness.     Comments: Right lower extremity warm and erythematous and tender to touch Healed superficial wound with indurated skin over the lateral distal ankle  Neurological:     General: No focal deficit present.     Mental Status: She is alert and oriented to person, place, and time.  Psychiatric:        Mood and Affect: Mood normal.        Behavior: Behavior normal.     Data Reviewed: I have personally reviewed following labs and imaging studies  CBC: Recent Labs  Lab 06/17/19 2333 06/19/19 0530 06/20/19 0608  WBC 18.3* 14.9* 8.8  NEUTROABS 17.1* 11.8* 6.4  HGB 10.1* 8.6* 8.3*  HCT 33.3* 29.4* 28.7*  MCV 89.5 91.3 91.7  PLT 308 289 [id number]   Basic Metabolic Panel: Recent Labs   Lab 06/17/19 2333 06/18/19 1331 06/19/19 0530 06/20/19 0608  NA 141 139 139 139  K 3.2* 3.9 3.8 3.5  CL 102 100 102 103  CO2 27 28 28 27   GLUCOSE 122* 123* 101* 100*  BUN 25* 30* 27* 27*  CREATININE 1.23* 1.47* 1.14* 1.01*  CALCIUM 9.0 8.4* 8.2* 8.2*   GFR: Estimated Creatinine Clearance: 52.9 mL/min (A) (by C-G formula based on SCr of 1.01 mg/dL (H)). Liver Function Tests: Recent Labs  Lab 06/17/19 2333 06/18/19 1331 06/19/19 0530 06/20/19 0608  AST 24 34 37 26  ALT 16 20 18 17   ALKPHOS 99 95 82 78  BILITOT 1.5* 1.0 1.0 0.7  PROT 6.4* 6.0* 5.3* 5.5*  ALBUMIN 3.5 3.1* 2.7* 2.7*   No results for input(s): LIPASE, AMYLASE in the last 168 hours. No results for input(s): AMMONIA in the last 168 hours. Coagulation Profile: Recent Labs  Lab 06/17/19 2333  INR 1.1   Cardiac Enzymes: No results for input(s): CKTOTAL, CKMB, CKMBINDEX, TROPONINI in the last 168 hours. BNP (last 3 results) No results for input(s): PROBNP in the last 8760 hours. HbA1C: No results for input(s): HGBA1C in the last 72 hours. CBG: No results for input(s): GLUCAP in the last 168 hours. Lipid Profile: Recent Labs    06/18/19 0004  TRIG 116   Thyroid Function Tests: No results for input(s): TSH, T4TOTAL, FREET4, T3FREE, THYROIDAB in the last 72 hours. Anemia Panel: Recent Labs    06/18/19 0004  FERRITIN 10*   Sepsis Labs: Recent Labs  Lab 06/18/19 0004 06/18/19 0030 06/18/19 0856 06/18/19 1331 06/19/19 0530 06/20/19 0608  PROCALCITON 1.09  --   --   --  6.62 3.70  LATICACIDVEN  --  2.5* 3.0* 1.7 1.6  --     Recent Results (from the past 240 hour(s))  Culture, blood (Routine x 2)     Status: Abnormal (Preliminary result)   Collection Time: 06/17/19 11:34 PM   Specimen: BLOOD LEFT FOREARM  Result Value Ref Range Status   Specimen Description   Final    BLOOD LEFT FOREARM Performed at [facility], [place] [address] [place], [place] [address]    Special Requests    Final    BOTTLES DRAWN AEROBIC AND ANAEROBIC Blood Culture results may not be optimal due to an excessive volume of blood received in culture bottles Performed at [facility] [address] [place], [place] [address]    Culture  Setup Time   Final    GRAM POSITIVE COCCI IN BOTH AEROBIC AND ANAEROBIC BOTTLES CRITICAL  VALUE NOTED.  VALUE IS CONSISTENT WITH PREVIOUSLY REPORTED AND CALLED VALUE. Performed at [facility], [place] [address] [place], [place] [address]    Culture STREPTOCOCCUS GORDONII (A)  Final   Report Status PENDING  Incomplete  Culture, blood (Routine x 2)     Status: None (Preliminary result)   Collection Time: 06/17/19 11:34 PM   Specimen: BLOOD  Result Value Ref Range Status   Specimen Description   Final    BLOOD RIGHT ANTECUBITAL Performed at [facility], [place] [address] [place], [place] [address]    Special Requests   Final    BOTTLES DRAWN AEROBIC AND ANAEROBIC Blood Culture adequate volume Performed at [facility] [address] [place], [place] [address]    Culture  Setup Time   Final    AEROBIC BOTTLE ONLY GRAM POSITIVE COCCI CRITICAL RESULT CALLED TO, READ BACK BY AND VERIFIED WITH: PHRMD M RENZ @0710  06/19/19 BY S GEZAHEGN Performed at [facility], [address] [place], [place] [address]    Culture GRAM POSITIVE COCCI  Final   Report Status PENDING  Incomplete  Blood Culture ID Panel (Reflexed)     Status: Abnormal   Collection Time: 06/17/19 11:34 PM  Result Value Ref Range Status   Enterococcus species NOT DETECTED NOT DETECTED Final   Listeria monocytogenes NOT DETECTED NOT DETECTED Final   Staphylococcus species NOT DETECTED NOT DETECTED Final   Staphylococcus aureus (BCID) NOT DETECTED NOT DETECTED Final   Streptococcus species DETECTED (A) NOT DETECTED Final    Comment: Not Enterococcus species, Streptococcus agalactiae, Streptococcus pyogenes, or Streptococcus  pneumoniae. CRITICAL RESULT CALLED TO, READ BACK BY AND VERIFIED WITH: PHRMD M RENZ @0710  06/19/19 BY S GEZAHEGN    Streptococcus agalactiae NOT DETECTED NOT DETECTED Final   Streptococcus pneumoniae NOT DETECTED NOT DETECTED Final   Streptococcus pyogenes NOT DETECTED NOT DETECTED Final   Acinetobacter baumannii NOT DETECTED NOT DETECTED Final   Enterobacteriaceae species NOT DETECTED NOT DETECTED Final   Enterobacter cloacae complex NOT DETECTED NOT DETECTED Final   Escherichia coli NOT DETECTED NOT DETECTED Final   Klebsiella oxytoca NOT DETECTED NOT DETECTED Final   Klebsiella pneumoniae NOT DETECTED NOT DETECTED Final   Proteus species NOT DETECTED NOT DETECTED Final   Serratia marcescens NOT DETECTED NOT DETECTED Final   Haemophilus influenzae NOT DETECTED NOT DETECTED Final   Neisseria meningitidis NOT DETECTED NOT DETECTED Final   Pseudomonas aeruginosa NOT DETECTED NOT DETECTED Final   Candida albicans NOT DETECTED NOT DETECTED Final   Candida glabrata NOT DETECTED NOT DETECTED Final   Candida krusei NOT DETECTED NOT DETECTED Final   Candida parapsilosis NOT DETECTED NOT DETECTED Final   Candida tropicalis NOT DETECTED NOT DETECTED Final    Comment: Performed at [facility], [place] [address], [place], [place] [address]  SARS CORONAVIRUS 2 (TAT 6-24 HRS) Nasopharyngeal Nasopharyngeal Swab     Status: None   Collection Time: 06/18/19  1:01 AM   Specimen: Nasopharyngeal Swab  Result Value Ref Range Status   SARS Coronavirus 2 NEGATIVE NEGATIVE Final    Comment: (NOTE) SARS-CoV-2 target nucleic acids are NOT DETECTED. The SARS-CoV-2 RNA is generally detectable in upper and lower respiratory specimens during the acute phase of infection. Negative results do not preclude SARS-CoV-2 infection, do not rule out co-infections with other pathogens, and should not be used as the sole basis for treatment or other patient management decisions. Negative results must be combined  with clinical observations, patient history, and epidemiological information. The expected result is Negative. Fact Sheet for Patients: SugarRoll.be Fact Sheet for Healthcare Providers: https://www.woods-mathews.com/ This test is not yet approved or cleared by the [place] FDA and  has been authorized for detection and/or diagnosis of SARS-CoV-2 by FDA under an Emergency Use Authorization (EUA). This EUA will remain  in effect (meaning this test can be used) for the duration of the COVID-19 declaration under Section 56 4(b)(1) of the Act, 21 U.S.C. section 360bbb-3(b)(1), unless the authorization is terminated or revoked sooner. Performed at [facility], [place] [address], [place], [place] [address]   MRSA PCR Screening     Status: None   Collection Time: 06/19/19  7:40 AM   Specimen: Nasal Mucosa; Nasopharyngeal  Result Value Ref Range Status   MRSA by PCR NEGATIVE NEGATIVE Final    Comment:        The GeneXpert MRSA Assay (FDA approved for NASAL specimens only), is one component of a comprehensive MRSA colonization surveillance program. It is not intended to diagnose MRSA infection nor to guide or monitor treatment for MRSA infections. Performed at [facility], [place] [address] [place], [place] [address]   Culture, blood (routine x 2)     Status: None (Preliminary result)   Collection Time: 06/19/19  8:30 AM   Specimen: BLOOD  Result Value Ref Range Status   Specimen Description   Final    BLOOD RIGHT ARM Performed at [facility] [address], [place], [place] [address]    Special Requests   Final    BOTTLES DRAWN AEROBIC AND ANAEROBIC Blood Culture adequate volume Performed at [facility] [address], [place], [place] [address]    Culture   Final    NO GROWTH 1 DAY Performed at [facility], [place] [address], [place], [place] [address]    Report  Status PENDING  Incomplete  Culture, blood (routine x 2)     Status: None (Preliminary result)   Collection Time: 06/19/19  8:30 AM   Specimen: BLOOD  Result Value Ref Range Status   Specimen Description   Final    BLOOD RIGHT HAND Performed at [facility] [address], [place], [place] [address]    Special Requests   Final    BOTTLES DRAWN AEROBIC AND ANAEROBIC Blood Culture adequate volume Performed at [facility] [address], [place], [place] [address]    Culture   Final    NO GROWTH 1 DAY Performed at [facility], [place] [address] [place], [place] [address]    Report Status PENDING  Incomplete         Radiology Studies: ECHOCARDIOGRAM COMPLETE  Result Date: 06/19/2019   ECHOCARDIOGRAM REPORT   Patient Name:   Chelsea [place] Date of Exam: 06/19/2019 Medical Rec #:  [id number]     Height:       62.0 in Accession #:    [id number]    Weight:       265.0 lb Date of Birth:  September 03, 1934     BSA:          2.15 m Patient Age:    35 years      BP:           139/63 mmHg Patient Gender: F             HR:  88 bpm. Exam Location:  Inpatient Procedure: 2D Echo, Color Doppler, Cardiac Doppler and Intracardiac            Opacification Agent Indications:    I26.02 Pulmonary embolus  History:        Patient has prior history of Echocardiogram examinations, most                 recent 06/23/2017. CHF; Risk Factors:Hypertension.  Sonographer:    Raquel Sarna Senior RDCS Referring Phys: [id number] [place]  1. Left ventricular ejection fraction, by visual estimation, is 60 to 65%. The left ventricle has normal function. There is mildly increased left ventricular hypertrophy.  2. Left ventricular diastolic parameters are consistent with Grade I diastolic dysfunction (impaired relaxation).  3. The left ventricle has no regional wall motion abnormalities.  4. Global right ventricle has normal systolic function.The right ventricular size  is normal. No increase in right ventricular wall thickness.  5. Left atrial size was normal.  6. Right atrial size was normal.  7. Mild mitral annular calcification.  8. The mitral valve is abnormal. Mild mitral valve regurgitation.  9. The tricuspid valve is normal in structure. Tricuspid valve regurgitation is mild. 10. The aortic valve is grossly normal. Aortic valve regurgitation is not visualized. 11. The pulmonic valve was normal in structure. Pulmonic valve regurgitation is not visualized. 12. Mildly elevated pulmonary artery systolic pressure. FINDINGS  Left Ventricle: Left ventricular ejection fraction, by visual estimation, is 60 to 65%. The left ventricle has normal function. The left ventricle has no regional wall motion abnormalities. There is mildly increased left ventricular hypertrophy. Left ventricular diastolic parameters are consistent with Grade I diastolic dysfunction (impaired relaxation). Right Ventricle: The right ventricular size is normal. No increase in right ventricular wall thickness. Global RV systolic function is has normal systolic function. The tricuspid regurgitant velocity is 2.97 m/s, and with an assumed right atrial pressure  of 3 mmHg, the estimated right ventricular systolic pressure is mildly elevated at 38.3 mmHg. Left Atrium: Left atrial size was normal in size. Right Atrium: Right atrial size was normal in size Pericardium: There is no evidence of pericardial effusion. Mitral Valve: The mitral valve is abnormal. Mild mitral annular calcification. Mild mitral valve regurgitation. MV peak gradient, 12.8 mmHg. Tricuspid Valve: The tricuspid valve is normal in structure. Tricuspid valve regurgitation is mild. Aortic Valve: The aortic valve is grossly normal. Aortic valve regurgitation is not visualized. Pulmonic Valve: The pulmonic valve was normal in structure. Pulmonic valve regurgitation is not visualized. Pulmonic regurgitation is not visualized. Aorta: The aortic root is  normal in size and structure. IAS/Shunts: No atrial level shunt detected by color flow Doppler.  LEFT VENTRICLE PLAX 2D LVIDd:         4.60 cm  Diastology LVIDs:         2.90 cm  LV e' lateral:   6.64 cm/s LV PW:         1.10 cm  LV E/e' lateral: 20.5 LV IVS:        1.20 cm  LV e' medial:    8.16 cm/s LVOT diam:     1.80 cm  LV E/e' medial:  16.7 LV SV:         65 ml LV SV Index:   27.49 LVOT Area:     2.54 cm  RIGHT VENTRICLE RV S prime:     15.05 cm/s TAPSE (M-mode): 2.5 cm LEFT ATRIUM  Index       RIGHT ATRIUM           Index LA diam:        2.90 cm 1.35 cm/m  RA Area:     13.40 cm LA Vol (A2C):   82.4 ml 38.24 ml/m RA Volume:   28.10 ml  13.04 ml/m LA Vol (A4C):   53.7 ml 24.92 ml/m LA Biplane Vol: 69.3 ml 32.16 ml/m  AORTIC VALVE LVOT Vmax:   120.00 cm/s LVOT Vmean:  81.000 cm/s LVOT VTI:    0.222 m  AORTA Ao Root diam: 2.70 cm Ao Asc diam:  2.90 cm MITRAL VALVE                        TRICUSPID VALVE MV Area (PHT): 4.57 cm             TR Peak grad:   35.3 mmHg MV Peak grad:  12.8 mmHg            TR Vmax:        297.00 cm/s MV Mean grad:  7.0 mmHg MV Vmax:       1.79 m/s             SHUNTS MV Vmean:      123.0 cm/s           Systemic VTI:  0.22 m MV VTI:        0.34 m               Systemic Diam: 1.80 cm MV PHT:        48.14 msec MV Decel Time: 166 msec MV E velocity: 136.00 cm/s 103 cm/s  Dorris Carnes MD Electronically signed by Dorris Carnes MD Signature Date/Time: 06/19/2019/3:58:45 PM    Final    VAS [place] LOWER EXTREMITY VENOUS (DVT)  Result Date: 06/20/2019  Lower Venous Study Indications: Pulmonary embolism.  Limitations: Poor ultrasound/tissue interface, body habitus and patient pain tolerance. Comparison Study: 06/23/17 previous Performing Technologist: Abram Sander RVS  Examination Guidelines: A complete evaluation includes B-mode imaging, spectral Doppler, color Doppler, and power Doppler as needed of all accessible portions of each vessel. Bilateral testing is considered an integral  part of a complete examination. Limited examinations for reoccurring indications may be performed as noted.  +---------+---------------+---------+-----------+----------+--------------+ RIGHT    CompressibilityPhasicitySpontaneityPropertiesThrombus Aging +---------+---------------+---------+-----------+----------+--------------+ CFV      Full           Yes      Yes                                 +---------+---------------+---------+-----------+----------+--------------+ SFJ      Full                                                        +---------+---------------+---------+-----------+----------+--------------+ FV Prox  Full                                                        +---------+---------------+---------+-----------+----------+--------------+ FV Mid   Full                                                        +---------+---------------+---------+-----------+----------+--------------+  FV DistalFull                                                        +---------+---------------+---------+-----------+----------+--------------+ PFV      Full                                                        +---------+---------------+---------+-----------+----------+--------------+ POP      Full           Yes      Yes                                 +---------+---------------+---------+-----------+----------+--------------+ PTV      Full                                                        +---------+---------------+---------+-----------+----------+--------------+ PERO                                                  Not visualized +---------+---------------+---------+-----------+----------+--------------+   +---------+---------------+---------+-----------+----------+-------------------+ LEFT     CompressibilityPhasicitySpontaneityPropertiesThrombus Aging      +---------+---------------+---------+-----------+----------+-------------------+  CFV      Full           Yes      Yes                                      +---------+---------------+---------+-----------+----------+-------------------+ SFJ      Full                                                             +---------+---------------+---------+-----------+----------+-------------------+ FV Prox  Full                                                             +---------+---------------+---------+-----------+----------+-------------------+ FV Mid   Full                                                             +---------+---------------+---------+-----------+----------+-------------------+ FV Distal               Yes      Yes  could not tolerate                                                        compression         +---------+---------------+---------+-----------+----------+-------------------+ PFV      Full                                                             +---------+---------------+---------+-----------+----------+-------------------+ POP      Full           Yes      Yes                                      +---------+---------------+---------+-----------+----------+-------------------+ PTV      Full                                                             +---------+---------------+---------+-----------+----------+-------------------+ PERO                                                  Not visualized      +---------+---------------+---------+-----------+----------+-------------------+     Summary: Right: There is no evidence of deep vein thrombosis in the lower extremity. No cystic structure found in the popliteal fossa. Left: There is no evidence of deep vein thrombosis in the lower extremity. However, portions of this examination were limited- see technologist comments above. No cystic structure found in the popliteal fossa.  *See table(s) above for measurements and observations.  Electronically signed by Deitra Mayo MD on 06/20/2019 at 2:40:42 PM.    Final         Scheduled Meds: . aspirin EC  81 mg Oral Daily  . chlorpheniramine-HYDROcodone  5 mL Oral Once  . enoxaparin (LOVENOX) injection  120 mg Subcutaneous BID  . gabapentin  300 mg Oral Daily  . gabapentin  600 mg Oral QHS   Continuous Infusions: . cefTRIAXone (ROCEPHIN)  IV 200 mL/hr at 06/20/19 0600     LOS: 2 days    Time spent: 30 minutes with over 50% of the time coordinating the patient's care    Harold Hedge, DO [facility] Pager [phone]  If 7PM-7AM, please contact night-coverage [email or web address] Password [facility] 06/20/2019, 6:12 PM

## 2019-06-20 NOTE — Progress Notes (Signed)
OT Cancellation Note  Patient Details Name: Ariannah Huschka MRN: [id number] DOB: [birth year]   Cancelled Treatment:    Reason Eval/Treat Not Completed: Patient not medically ready Pt with pulmonary embolism. Per RN, pt has not started medication yet. Will hold OT at this time and return as time allows and pt is appropriate.   Dorinda Hill OTR/L Acute Rehabilitation Services Office: [place] 06/20/2019, 3:09 PM

## 2019-06-20 NOTE — Progress Notes (Signed)
Lower extremity venous has been completed.   Preliminary results in CV Proc.   Abram Sander 06/20/2019 9:52 AM

## 2019-06-20 NOTE — Consult Note (Signed)
[facility]  Total days of antibiotics 3               Reason for Consult: streptococcal bacteremia   Referring Physician: segal  Principal Problem:   Acute respiratory failure with hypoxia ([place]) Active Problems:   Essential hypertension   Chronic diastolic CHF (congestive heart failure) (HCC)   CKD (chronic kidney disease) stage 3, GFR 30-59 ml/min   History of pulmonary embolism   SIRS (systemic inflammatory response syndrome) ([place])   Suspected COVID-19 virus infection   Pulmonary embolism (HCC)    HPI: Chelsea Keller is a 84 y.o. female with HTN, CDK, CHF, hx of PE, and chronic BLE edema, admitted on 12/12 with increasing shortness of breath hypoxia noted by EMS, requiring new oxygen requirement in the setting of  fever, leukocytosis of 18K. She was found to have bilateral PE on CT-A and infectious work up revealed + streptococcal bacteremia. She states that roughly 2 weks ago she started to have a lesion on her right lower leg with increasing edema, swelling and drainage. She was started on bactrim with some improvement.she did subscribe to chills, nightsweats and SOB over the last week. On exam, her right leg does have evidence of cellulitis. She was started on ceftriaxone and azithromycin where her leukocytosis has improved. DVT has been ruled out. She had TTE that did not show evidence of endocarditis. ID asked to weigh in on management  Past Medical History:  Diagnosis Date  . Allergy   . Anemia   . Anxiety   . Arthritis   . Arthritis of sacroiliac joint of both sides 11/12/2017  . Bilateral pulmonary embolism ([place]) 06/23/2017  . Chronic diastolic CHF (congestive heart failure) (HCC)    a. Echo 1/16:  mild LVH, EF normal, grade 1 DD, MAC  . Chronic venous insufficiency    chronic LE edema  . DDD (degenerative disc disease), lumbar 11/12/2017  . Degenerative joint disease (DJD) of hip, Bilateral  11/12/2017  . Depression   . Fibromyalgia     constant pain  . Hx of cardiac catheterization    a. LHC in [place] "ok" per patient with mild plaque in a single vessel - records not available  . Hx of cardiovascular stress test    a. Nuclear study in 2008 normal  . Hx of colonic polyps   . Hypertension   . Hypertriglyceridemia   . Impaired fasting glucose   . PONV (postoperative nausea and vomiting)   . PUD (peptic ulcer disease)    hx of gastric ulcer  . Pulmonary emboli ([place]) 06/2017  . Vitamin B12 deficiency     Allergies:  Allergies  Allergen Reactions  . Codeine Sulfate Shortness Of Breath  . Celecoxib Other (See Comments)    Caused vaginal bleeding  . Erythromycin Base Nausea And Vomiting    MEDICATIONS: . aspirin EC  81 mg Oral Daily  . chlorpheniramine-HYDROcodone  5 mL Oral Once  . enoxaparin (LOVENOX) injection  120 mg Subcutaneous BID  . gabapentin  300 mg Oral Daily  . gabapentin  600 mg Oral QHS    Social History   Tobacco Use  . Smoking status: Former Smoker    Packs/day: 1.00    Years: 40.00    Pack years: 40.00    Types: Cigarettes    Quit date: 07/06/1993    Years since quitting: 25.9  . Smokeless tobacco: Never Used  Substance Use Topics  . Alcohol use: No  Alcohol/week: 0.0 standard drinks  . Drug use: No    Family History  Problem Relation Age of Onset  . Depression Mother   . Cancer Mother        uterine cancer  . Heart attack Father   . Cancer Brother        prostate cancer  . Diabetes Maternal Aunt   . Arthritis Brother   . Asthma Brother   . Stroke Maternal Grandmother   . Pulmonary embolism Daughter      Review of Systems  Constitutional: Negative for fever, chills, diaphoresis, activity change, appetite change, fatigue and unexpected weight change.  HENT: Negative for congestion, sore throat, rhinorrhea, sneezing, trouble swallowing and sinus pressure.  Eyes: Negative for photophobia and visual disturbance.  Respiratory: +SOB, and cough. Negative for chest tightness,  wheezing and stridor.  Cardiovascular: Negative for chest pain, palpitations and leg swelling.  Gastrointestinal: Negative for nausea, vomiting, abdominal pain, diarrhea, constipation, blood in stool, abdominal distention and anal bleeding.  Genitourinary: Negative for dysuria, hematuria, flank pain and difficulty urinating.  Musculoskeletal: Negative for myalgias, back pain, joint swelling, arthralgias and gait problem.  Skin: Negative for color change, pallor, rash and wound.  Neurological: Negative for dizziness, tremors, weakness and light-headedness.  Hematological: Negative for adenopathy. Does not bruise/bleed easily.  Psychiatric/Behavioral: Negative for behavioral problems, confusion, sleep disturbance, dysphoric mood, decreased concentration and agitation.     OBJECTIVE: Temp:  [97.8 F (36.6 C)-98.2 F (36.8 C)] 98.1 F (36.7 C) (12/15 1246) Pulse Rate:  [78-93] 88 (12/15 1246) BP: (140-151)/(58-66) 151/58 (12/15 1246) SpO2:  [91 %-100 %] 99 % (12/15 1246) Physical Exam  Constitutional:  oriented to person, place, and time. appears well-developed and well-nourished. No distress.  HENT: [place]/AT, PERRLA, no scleral icterus Mouth/Throat: Oropharynx is clear and moist. No oropharyngeal exudate.  Cardiovascular: Normal rate, regular rhythm and normal heart sounds. Exam reveals no gallop and no friction rub.  No murmur heard.  Pulmonary/Chest: Effort normal and breath sounds normal. No respiratory distress.  has no wheezes.  Neck = supple, no nuchal rigidity Abdominal: Soft. Bowel sounds are normal.  exhibits no distension. There is no tenderness.  Lymphadenopathy: no cervical adenopathy. No axillary adenopathy Neurological: alert and oriented to person, place, and time. Ext: bilateral lower extremity edema with +1 pitting edema  Skin: dry extremities, with blanching erythema to right calf. Some healing bruising are previous draining site Psychiatric: a normal mood and affect.   behavior is normal.   LABS: Results for orders placed or performed during the hospital encounter of 06/17/19 (from the past 48 hour(s))  HIV Antibody (routine testing w rflx)     Status: None   Collection Time: 06/19/19  5:30 AM  Result Value Ref Range   HIV Screen 4th Generation wRfx NON REACTIVE NON REACTIVE    Comment: Performed at [facility], [address] [place], [place] [address]  APTT     Status: Abnormal   Collection Time: 06/19/19  5:30 AM  Result Value Ref Range   aPTT 52 (H) 24 - 36 seconds    Comment:        IF BASELINE aPTT IS ELEVATED, SUGGEST PATIENT RISK ASSESSMENT BE USED TO DETERMINE APPROPRIATE ANTICOAGULANT THERAPY. Performed at [facility], [place] [address] [place], [place] [address]   CBC with Differential/Platelet     Status: Abnormal   Collection Time: 06/19/19  5:30 AM  Result Value Ref Range   WBC 14.9 (H) 4.0 - 10.5  K/uL   RBC 3.22 (L) 3.87 - 5.11 MIL/uL   Hemoglobin 8.6 (L) 12.0 - 15.0 g/dL   HCT 29.4 (L) 36.0 - 46.0 %   MCV 91.3 80.0 - 100.0 fL   MCH 26.7 26.0 - 34.0 pg   MCHC 29.3 (L) 30.0 - 36.0 g/dL   RDW 17.0 (H) 11.5 - 15.5 %   Platelets 289 150 - 400 K/uL   nRBC 0.0 0.0 - 0.2 %   Neutrophils Relative % 79 %   Neutro Abs 11.8 (H) 1.7 - 7.7 K/uL   Lymphocytes Relative 10 %   Lymphs Abs 1.4 0.7 - 4.0 K/uL   Monocytes Relative 8 %   Monocytes Absolute 1.2 (H) 0.1 - 1.0 K/uL   Eosinophils Relative 2 %   Eosinophils Absolute 0.3 0.0 - 0.5 K/uL   Basophils Relative 0 %   Basophils Absolute 0.1 0.0 - 0.1 K/uL   Immature Granulocytes 1 %   Abs Immature Granulocytes 0.15 (H) 0.00 - 0.07 K/uL    Comment: Performed at [facility], [place] [address], [place], [place] [address]  Comprehensive metabolic panel     Status: Abnormal   Collection Time: 06/19/19  5:30 AM  Result Value Ref Range   Sodium 139 135 - 145 mmol/L   Potassium 3.8 3.5 - 5.1 mmol/L   Chloride 102 98 - 111 mmol/L   CO2 28 22 - 32  mmol/L   Glucose, Bld 101 (H) 70 - 99 mg/dL   BUN 27 (H) 8 - 23 mg/dL   Creatinine, Ser 1.14 (H) 0.44 - 1.00 mg/dL   Calcium 8.2 (L) 8.9 - 10.3 mg/dL   Total Protein 5.3 (L) 6.5 - 8.1 g/dL   Albumin 2.7 (L) 3.5 - 5.0 g/dL   AST 37 15 - 41 U/L   ALT 18 0 - 44 U/L   Alkaline Phosphatase 82 38 - 126 U/L   Total Bilirubin 1.0 0.3 - 1.2 mg/dL   GFR calc non Af Amer 45 (L) >60 mL/min   GFR calc Af Amer 52 (L) >60 mL/min   Anion gap 9 5 - 15    Comment: Performed at [facility], [place] [address], [place], [place] [address]  C-reactive protein     Status: Abnormal   Collection Time: 06/19/19  5:30 AM  Result Value Ref Range   CRP 16.8 (H) <1.0 mg/dL    Comment: Performed at [facility], [place] [address], [place], [place] [address]  D-dimer, quantitative (not at [facility])     Status: Abnormal   Collection Time: 06/19/19  5:30 AM  Result Value Ref Range   D-Dimer, Quant 4.88 (H) 0.00 - 0.50 ug/mL-FEU    Comment: (NOTE) At the manufacturer cut-off of 0.50 ug/mL FEU, this assay has been documented to exclude PE with a sensitivity and negative predictive value of 97 to 99%.  At this time, this assay has not been approved by the FDA to exclude DVT/VTE. Results should be correlated with clinical presentation. Performed at [facility], [place] [address], [place], [place] [address]   Lactic acid, plasma     Status: None   Collection Time: 06/19/19  5:30 AM  Result Value Ref Range   Lactic Acid, Venous 1.6 0.5 - 1.9 mmol/L    Comment: Performed at [facility], [place] [address], [place], [place] [address]  Procalcitonin     Status: None   Collection Time: 06/19/19  5:30 AM  Result Value Ref Range   Procalcitonin 6.62 ng/mL    Comment:        Interpretation: PCT > 2 ng/mL: Systemic infection (sepsis) is likely, unless other causes are known. (NOTE)       Sepsis PCT Algorithm           Lower Respiratory Tract                                       Infection PCT Algorithm    ----------------------------     ----------------------------         PCT < 0.25 ng/mL                PCT < 0.10 ng/mL         Strongly encourage             Strongly discourage   discontinuation of antibiotics    initiation of antibiotics    ----------------------------     -----------------------------       PCT 0.25 - 0.50 ng/mL            PCT 0.10 - 0.25 ng/mL               OR       >80% decrease in PCT            Discourage initiation of                                            antibiotics      Encourage discontinuation           of antibiotics    ----------------------------     -----------------------------         PCT >= 0.50 ng/mL              PCT 0.26 - 0.50 ng/mL               AND       <80% decrease in PCT              Encourage initiation of                                             antibiotics       Encourage continuation           of antibiotics    ----------------------------     -----------------------------        PCT >= 0.50 ng/mL                  PCT > 0.50 ng/mL               AND         increase in PCT                  Strongly encourage                                      initiation of antibiotics    Strongly encourage escalation           of  antibiotics                                     -----------------------------                                           PCT <= 0.25 ng/mL                                                 OR                                        > 80% decrease in PCT                                     Discontinue / Do not initiate                                             antibiotics Performed at [facility] [address] [place], [place] [address]   MRSA PCR Screening     Status: None   Collection Time: 06/19/19  7:40 AM   Specimen: Nasal Mucosa; Nasopharyngeal  Result Value Ref Range   MRSA by PCR NEGATIVE NEGATIVE    Comment:        The GeneXpert  MRSA Assay (FDA approved for NASAL specimens only), is one component of a comprehensive MRSA colonization surveillance program. It is not intended to diagnose MRSA infection nor to guide or monitor treatment for MRSA infections. Performed at [facility], [place] [address], [place], [place] [address]   Culture, blood (routine x 2)     Status: None (Preliminary result)   Collection Time: 06/19/19  8:30 AM   Specimen: BLOOD  Result Value Ref Range   Specimen Description      BLOOD RIGHT ARM Performed at [facility] [address] [place], [place] [address]    Special Requests      BOTTLES DRAWN AEROBIC AND ANAEROBIC Blood Culture adequate volume Performed at [facility] [address] [place], [place] [address]    Culture      NO GROWTH 1 DAY Performed at [facility] [address], [place], [place] [address]    Report Status PENDING   Culture, blood (routine x 2)     Status: None (Preliminary result)   Collection Time: 06/19/19  8:30 AM   Specimen: BLOOD  Result Value Ref Range   Specimen Description      BLOOD RIGHT HAND Performed at [facility] [address] [place], [place] [address]    Special Requests      BOTTLES DRAWN AEROBIC AND ANAEROBIC Blood Culture adequate volume Performed at [facility] [address] [place], [place] [address]    Culture      NO GROWTH 1 DAY Performed at  [facility], [place] [address], [place], [place] [address]    Report Status PENDING   CBC with Differential/Platelet     Status: Abnormal   Collection Time: 06/20/19  6:08 AM  Result Value Ref Range   WBC 8.8 4.0 - 10.5 K/uL   RBC 3.13 (L) 3.87 - 5.11 MIL/uL   Hemoglobin 8.3 (L) 12.0 - 15.0 g/dL   HCT 28.7 (L) 36.0 - 46.0 %   MCV 91.7 80.0 - 100.0 fL   MCH 26.5 26.0 - 34.0 pg   MCHC 28.9 (L) 30.0 - 36.0 g/dL   RDW 17.0 (H) 11.5 - 15.5 %   Platelets 270 150 - 400  K/uL   nRBC 0.0 0.0 - 0.2 %   Neutrophils Relative % 73 %   Neutro Abs 6.4 1.7 - 7.7 K/uL   Lymphocytes Relative 14 %   Lymphs Abs 1.2 0.7 - 4.0 K/uL   Monocytes Relative 8 %   Monocytes Absolute 0.7 0.1 - 1.0 K/uL   Eosinophils Relative 3 %   Eosinophils Absolute 0.3 0.0 - 0.5 K/uL   Basophils Relative 1 %   Basophils Absolute 0.0 0.0 - 0.1 K/uL   Immature Granulocytes 1 %   Abs Immature Granulocytes 0.11 (H) 0.00 - 0.07 K/uL    Comment: Performed at [facility], [place] [address], [place], [place] [address]  Comprehensive metabolic panel     Status: Abnormal   Collection Time: 06/20/19  6:08 AM  Result Value Ref Range   Sodium 139 135 - 145 mmol/L   Potassium 3.5 3.5 - 5.1 mmol/L   Chloride 103 98 - 111 mmol/L   CO2 27 22 - 32 mmol/L   Glucose, Bld 100 (H) 70 - 99 mg/dL   BUN 27 (H) 8 - 23 mg/dL   Creatinine, Ser 1.01 (H) 0.44 - 1.00 mg/dL   Calcium 8.2 (L) 8.9 - 10.3 mg/dL   Total Protein 5.5 (L) 6.5 - 8.1 g/dL   Albumin 2.7 (L) 3.5 - 5.0 g/dL   AST 26 15 - 41 U/L   ALT 17 0 - 44 U/L   Alkaline Phosphatase 78 38 - 126 U/L   Total Bilirubin 0.7 0.3 - 1.2 mg/dL   GFR calc non Af Amer 52 (L) >60 mL/min   GFR calc Af Amer >60 >60 mL/min   Anion gap 9 5 - 15    Comment: Performed at [facility], [place] [address], [place], [place] [address]  C-reactive protein     Status: Abnormal   Collection Time: 06/20/19  6:08 AM  Result Value Ref Range   CRP 10.9 (H) <1.0 mg/dL    Comment: Performed at [facility], [place] [address] [place], [place] [address]  Procalcitonin     Status: None   Collection Time: 06/20/19  6:08 AM  Result Value Ref Range   Procalcitonin 3.70 ng/mL    Comment:        Interpretation: PCT > 2 ng/mL: Systemic infection (sepsis) is likely, unless other causes are known. (NOTE)       Sepsis PCT Algorithm           Lower Respiratory Tract                                      Infection PCT Algorithm     ----------------------------     ----------------------------  PCT < 0.25 ng/mL                PCT < 0.10 ng/mL         Strongly encourage             Strongly discourage   discontinuation of antibiotics    initiation of antibiotics    ----------------------------     -----------------------------       PCT 0.25 - 0.50 ng/mL            PCT 0.10 - 0.25 ng/mL               OR       >80% decrease in PCT            Discourage initiation of                                            antibiotics      Encourage discontinuation           of antibiotics    ----------------------------     -----------------------------         PCT >= 0.50 ng/mL              PCT 0.26 - 0.50 ng/mL               AND       <80% decrease in PCT              Encourage initiation of                                             antibiotics       Encourage continuation           of antibiotics    ----------------------------     -----------------------------        PCT >= 0.50 ng/mL                  PCT > 0.50 ng/mL               AND         increase in PCT                  Strongly encourage                                      initiation of antibiotics    Strongly encourage escalation           of antibiotics                                     -----------------------------                                           PCT <= 0.25 ng/mL  OR                                        > 80% decrease in PCT                                     Discontinue / Do not initiate                                             antibiotics Performed at [facility] [address] [place], [place] [address]     MICRO: 12/12 blood cx + strep 12/14 blood cx pending IMAGING: ECHOCARDIOGRAM COMPLETE  Result Date: 06/19/2019   ECHOCARDIOGRAM REPORT   Patient Name:   TERIANA DANKER Date of Exam: 06/19/2019 Medical Rec #:  [id number]     Height:       62.0 in  Accession #:    [id number]    Weight:       265.0 lb Date of Birth:  [birth year]     BSA:          2.15 m Patient Age:    51 years      BP:           139/63 mmHg Patient Gender: F             HR:           88 bpm. Exam Location:  Inpatient Procedure: 2D Echo, Color Doppler, Cardiac Doppler and Intracardiac            Opacification Agent Indications:    I26.02 Pulmonary embolus  History:        Patient has prior history of Echocardiogram examinations, most                 recent 06/23/2017. CHF; Risk Factors:Hypertension.  Sonographer:    Raquel Sarna Senior RDCS Referring Phys: [id number] [place]  1. Left ventricular ejection fraction, by visual estimation, is 60 to 65%. The left ventricle has normal function. There is mildly increased left ventricular hypertrophy.  2. Left ventricular diastolic parameters are consistent with Grade I diastolic dysfunction (impaired relaxation).  3. The left ventricle has no regional wall motion abnormalities.  4. Global right ventricle has normal systolic function.The right ventricular size is normal. No increase in right ventricular wall thickness.  5. Left atrial size was normal.  6. Right atrial size was normal.  7. Mild mitral annular calcification.  8. The mitral valve is abnormal. Mild mitral valve regurgitation.  9. The tricuspid valve is normal in structure. Tricuspid valve regurgitation is mild. 10. The aortic valve is grossly normal. Aortic valve regurgitation is not visualized. 11. The pulmonic valve was normal in structure. Pulmonic valve regurgitation is not visualized. 12. Mildly elevated pulmonary artery systolic pressure. FINDINGS  Left Ventricle: Left ventricular ejection fraction, by visual estimation, is 60 to 65%. The left ventricle has normal function. The left ventricle has no regional wall motion abnormalities. There is mildly increased left ventricular hypertrophy. Left ventricular diastolic parameters are consistent with Grade I diastolic  dysfunction (impaired relaxation). Right Ventricle: The right ventricular size is normal. No increase in right ventricular wall thickness. Global RV systolic function  is has normal systolic function. The tricuspid regurgitant velocity is 2.97 m/s, and with an assumed right atrial pressure  of 3 mmHg, the estimated right ventricular systolic pressure is mildly elevated at 38.3 mmHg. Left Atrium: Left atrial size was normal in size. Right Atrium: Right atrial size was normal in size Pericardium: There is no evidence of pericardial effusion. Mitral Valve: The mitral valve is abnormal. Mild mitral annular calcification. Mild mitral valve regurgitation. MV peak gradient, 12.8 mmHg. Tricuspid Valve: The tricuspid valve is normal in structure. Tricuspid valve regurgitation is mild. Aortic Valve: The aortic valve is grossly normal. Aortic valve regurgitation is not visualized. Pulmonic Valve: The pulmonic valve was normal in structure. Pulmonic valve regurgitation is not visualized. Pulmonic regurgitation is not visualized. Aorta: The aortic root is normal in size and structure. IAS/Shunts: No atrial level shunt detected by color flow Doppler.  LEFT VENTRICLE PLAX 2D LVIDd:         4.60 cm  Diastology LVIDs:         2.90 cm  LV e' lateral:   6.64 cm/s LV PW:         1.10 cm  LV E/e' lateral: 20.5 LV IVS:        1.20 cm  LV e' medial:    8.16 cm/s LVOT diam:     1.80 cm  LV E/e' medial:  16.7 LV SV:         65 ml LV SV Index:   27.49 LVOT Area:     2.54 cm  RIGHT VENTRICLE RV S prime:     15.05 cm/s TAPSE (M-mode): 2.5 cm LEFT ATRIUM             Index       RIGHT ATRIUM           Index LA diam:        2.90 cm 1.35 cm/m  RA Area:     13.40 cm LA Vol (A2C):   82.4 ml 38.24 ml/m RA Volume:   28.10 ml  13.04 ml/m LA Vol (A4C):   53.7 ml 24.92 ml/m LA Biplane Vol: 69.3 ml 32.16 ml/m  AORTIC VALVE LVOT Vmax:   120.00 cm/s LVOT Vmean:  81.000 cm/s LVOT VTI:    0.222 m  AORTA Ao Root diam: 2.70 cm Ao Asc diam:  2.90 cm MITRAL  VALVE                        TRICUSPID VALVE MV Area (PHT): 4.57 cm             TR Peak grad:   35.3 mmHg MV Peak grad:  12.8 mmHg            TR Vmax:        297.00 cm/s MV Mean grad:  7.0 mmHg MV Vmax:       1.79 m/s             SHUNTS MV Vmean:      123.0 cm/s           Systemic VTI:  0.22 m MV VTI:        0.34 m               Systemic Diam: 1.80 cm MV PHT:        48.14 msec MV Decel Time: 166 msec MV E velocity: 136.00 cm/s 103 cm/s  Dorris Carnes MD Electronically signed by Dorris Carnes MD Signature Date/Time:  06/19/2019/3:58:45 PM    Final    VAS [place] LOWER EXTREMITY VENOUS (DVT)  Result Date: 06/20/2019  Lower Venous Study Indications: Pulmonary embolism.  Limitations: Poor ultrasound/tissue interface, body habitus and patient pain tolerance. Comparison Study: 06/23/17 previous Performing Technologist: Abram Sander RVS  Examination Guidelines: A complete evaluation includes B-mode imaging, spectral Doppler, color Doppler, and power Doppler as needed of all accessible portions of each vessel. Bilateral testing is considered an integral part of a complete examination. Limited examinations for reoccurring indications may be performed as noted.  +---------+---------------+---------+-----------+----------+--------------+ RIGHT    CompressibilityPhasicitySpontaneityPropertiesThrombus Aging +---------+---------------+---------+-----------+----------+--------------+ CFV      Full           Yes      Yes                                 +---------+---------------+---------+-----------+----------+--------------+ SFJ      Full                                                        +---------+---------------+---------+-----------+----------+--------------+ FV Prox  Full                                                        +---------+---------------+---------+-----------+----------+--------------+ FV Mid   Full                                                         +---------+---------------+---------+-----------+----------+--------------+ FV DistalFull                                                        +---------+---------------+---------+-----------+----------+--------------+ PFV      Full                                                        +---------+---------------+---------+-----------+----------+--------------+ POP      Full           Yes      Yes                                 +---------+---------------+---------+-----------+----------+--------------+ PTV      Full                                                        +---------+---------------+---------+-----------+----------+--------------+ PERO  Not visualized +---------+---------------+---------+-----------+----------+--------------+   +---------+---------------+---------+-----------+----------+-------------------+ LEFT     CompressibilityPhasicitySpontaneityPropertiesThrombus Aging      +---------+---------------+---------+-----------+----------+-------------------+ CFV      Full           Yes      Yes                                      +---------+---------------+---------+-----------+----------+-------------------+ SFJ      Full                                                             +---------+---------------+---------+-----------+----------+-------------------+ FV Prox  Full                                                             +---------+---------------+---------+-----------+----------+-------------------+ FV Mid   Full                                                             +---------+---------------+---------+-----------+----------+-------------------+ FV Distal               Yes      Yes                  could not tolerate                                                        compression          +---------+---------------+---------+-----------+----------+-------------------+ PFV      Full                                                             +---------+---------------+---------+-----------+----------+-------------------+ POP      Full           Yes      Yes                                      +---------+---------------+---------+-----------+----------+-------------------+ PTV      Full                                                             +---------+---------------+---------+-----------+----------+-------------------+ PERO  Not visualized      +---------+---------------+---------+-----------+----------+-------------------+     Summary: Right: There is no evidence of deep vein thrombosis in the lower extremity. No cystic structure found in the popliteal fossa. Left: There is no evidence of deep vein thrombosis in the lower extremity. However, portions of this examination were limited- see technologist comments above. No cystic structure found in the popliteal fossa.  *See table(s) above for measurements and observations. Electronically signed by Deitra Mayo MD on 06/20/2019 at 2:40:42 PM.    Final     Assessment/Plan: 84yo F iwith hx of PE off of anticoagulation presently, who presents with respiratory distress/sepsis with streptococcal bacteremia with bilateral PE concern for endocarditis. Given length of symptoms, risk of TEE in octogenarian, would presumptively treat for endocarditis vs septic thromboembolism - will plan on ceftriaxone 2gm IV daily (for ease of administration for home) will recommend to treat for prolonged course of 6 wks - will need picc line - if her repeat blood cx are NGTD tomorrow, can have picc line placed - will see back in the ID clinic in 4-6wk  She will need opat/hh orders below  Diagnosis: Bacteremia,  Culture Result: streptococcus  Allergies  Allergen Reactions  .  Codeine Sulfate Shortness Of Breath  . Celecoxib Other (See Comments)    Caused vaginal bleeding  . Erythromycin Base Nausea And Vomiting    OPAT Orders Discharge antibiotics: Per pharmacy protocol  Ceftriaxone 2gm IV daily Aim for Vancomycin trough 15-20 or AUC 400-550 (unless otherwise indicated) Duration: 6 wk End Date: Jan 25  [place] Per Protocol:  Labs weekly while on IV antibiotics: _x_ CBC with differential _x_ BMP    _x_ Please pull PIC at completion of IV antibiotics   Fax weekly labs to [phone]  Clinic Follow Up Appt: 4-6 wk  @

## 2019-06-20 NOTE — Evaluation (Signed)
Physical Therapy Evaluation Patient Details Name: Chelsea Keller MRN: [id number] DOB: Jul 24, 1934 Today's Date: 06/20/2019   History of Present Illness  84 yo female admitted with acute respiratory failure. Found to have acute bil PEs and sepsis. Hx of morbid obesity,HF, DVT, PE.  Clinical Impression  On eval, pt was mostly Min guard for mobility. She required a small amount of assistance to help get LEs onto bed. Pt tolerated activity fairly well. Dyspnea 2/4 with ambulation. 2 brief standing rest breaks taken during walk. Will continue to follow during hospital stay.     Follow Up Recommendations Home health PT;Supervision - Intermittent    Equipment Recommendations  None recommended by PT    Recommendations for Other Services       Precautions / Restrictions Precautions Precautions: Fall Restrictions Weight Bearing Restrictions: No      Mobility  Bed Mobility Overal bed mobility: Needs Assistance Bed Mobility: Supine to Sit;Sit to Supine     Supine to sit: Supervision Sit to supine: Min assist   General bed mobility comments: Very small amount of assist for LEs back onto bed. Increased time.  Transfers Overall transfer level: Needs assistance Equipment used: Rolling walker (2 wheeled) Transfers: Sit to/from Stand Sit to Stand: Supervision            Ambulation/Gait Ambulation/Gait assistance: Min guard Gait Distance (Feet): 90 Feet Assistive device: Rolling walker (2 wheeled) Gait Pattern/deviations: Trunk flexed;Decreased stride length     General Gait Details: slow gait speed. somewhat effortful at times due to fatigue, dyspnea.  Stairs            Wheelchair Mobility    Modified Rankin (Stroke Patients Only)       Balance Overall balance assessment: Needs assistance         Standing balance support: Bilateral upper extremity supported                                 Pertinent Vitals/Pain Pain Assessment: No/denies pain     Home Living Family/patient expects to be discharged to:: Private residence Living Arrangements: Children;Other relatives Available Help at Discharge: Family;Available PRN/intermittently Type of Home: House Home Access: Level entry     Home Layout: Two level Home Equipment: Bedside commode;Tub bench;Walker - 4 wheels;Cane - single point;Grab bars - tub/shower;Shower seat;Other (comment);Electric scooter      Prior Function Level of Independence: Independent with assistive device(s)         Comments: rollator for household ambulation, independent with ADL     Hand Dominance        Extremity/Trunk Assessment   Upper Extremity Assessment Upper Extremity Assessment: Generalized weakness    Lower Extremity Assessment Lower Extremity Assessment: Generalized weakness    Cervical / Trunk Assessment Cervical / Trunk Assessment: Normal  Communication   Communication: No difficulties  Cognition Arousal/Alertness: Awake/alert Behavior During Therapy: WFL for tasks assessed/performed Overall Cognitive Status: Within Functional Limits for tasks assessed                                        General Comments      Exercises     Assessment/Plan    PT Assessment Patient needs continued PT services  PT Problem List Decreased activity tolerance;Decreased balance;Decreased mobility;Decreased knowledge of use of DME;Obesity       PT Treatment Interventions DME  instruction;Gait training;Functional mobility training;Therapeutic exercise;Therapeutic activities;Patient/family education    PT Goals (Current goals can be found in the Care Plan section)  Acute Rehab PT Goals Patient Stated Goal: to get better PT Goal Formulation: With patient Time For Goal Achievement: 07/04/19 Potential to Achieve Goals: Good    Frequency Min 3X/week   Barriers to discharge        Co-evaluation               AM-PAC PT "6 Clicks" Mobility  Outcome Measure  Help needed turning from your back to your side while in a flat bed without using bedrails?: A Little Help needed moving from lying on your back to sitting on the side of a flat bed without using bedrails?: A Little Help needed moving to and from a bed to a chair (including a wheelchair)?: A Little Help needed standing up from a chair using your arms (e.g., wheelchair or bedside chair)?: A Little Help needed to walk in hospital room?: A Little Help needed climbing 3-5 steps with a railing? : A Lot 6 Click Score: 17    End of Session   Activity Tolerance: Patient tolerated treatment well Patient left: in bed;with call bell/phone within reach;with family/visitor present   PT Visit Diagnosis: Difficulty in walking, not elsewhere classified (R26.2)    Time: [id number] PT Time Calculation (min) (ACUTE ONLY): 28 min   Charges:   PT Evaluation $PT Eval Moderate Complexity: 1 Mod PT Treatments $Gait Training: 8-22 mins           Doreatha Massed, PT Acute Rehabilitation Services

## 2019-06-21 ENCOUNTER — Inpatient Hospital Stay: Payer: Self-pay

## 2019-06-21 LAB — CULTURE, BLOOD (ROUTINE X 2): Special Requests: ADEQUATE

## 2019-06-21 LAB — CBC WITH DIFFERENTIAL/PLATELET
Abs Immature Granulocytes: 0.15 10*3/uL — ABNORMAL HIGH (ref 0.00–0.07)
Basophils Absolute: 0.1 10*3/uL (ref 0.0–0.1)
Basophils Relative: 1 %
Eosinophils Absolute: 0.4 10*3/uL (ref 0.0–0.5)
Eosinophils Relative: 4 %
HCT: 30.6 % — ABNORMAL LOW (ref 36.0–46.0)
Hemoglobin: 8.9 g/dL — ABNORMAL LOW (ref 12.0–15.0)
Immature Granulocytes: 2 %
Lymphocytes Relative: 13 %
Lymphs Abs: 1.3 10*3/uL (ref 0.7–4.0)
MCH: 26.6 pg (ref 26.0–34.0)
MCHC: 29.1 g/dL — ABNORMAL LOW (ref 30.0–36.0)
MCV: 91.3 fL (ref 80.0–100.0)
Monocytes Absolute: 1 10*3/uL (ref 0.1–1.0)
Monocytes Relative: 10 %
Neutro Abs: 6.8 10*3/uL (ref 1.7–7.7)
Neutrophils Relative %: 70 %
Platelets: 358 10*3/uL (ref 150–400)
RBC: 3.35 MIL/uL — ABNORMAL LOW (ref 3.87–5.11)
RDW: 16.9 % — ABNORMAL HIGH (ref 11.5–15.5)
WBC: 9.7 10*3/uL (ref 4.0–10.5)
nRBC: 0.3 % — ABNORMAL HIGH (ref 0.0–0.2)

## 2019-06-21 LAB — COMPREHENSIVE METABOLIC PANEL
ALT: 15 U/L (ref 0–44)
AST: 23 U/L (ref 15–41)
Albumin: 2.6 g/dL — ABNORMAL LOW (ref 3.5–5.0)
Alkaline Phosphatase: 86 U/L (ref 38–126)
Anion gap: 10 (ref 5–15)
BUN: 24 mg/dL — ABNORMAL HIGH (ref 8–23)
CO2: 26 mmol/L (ref 22–32)
Calcium: 8.3 mg/dL — ABNORMAL LOW (ref 8.9–10.3)
Chloride: 107 mmol/L (ref 98–111)
Creatinine, Ser: 0.82 mg/dL (ref 0.44–1.00)
GFR calc Af Amer: >60 mL/min (ref >60)
GFR calc non Af Amer: >60 mL/min (ref >60)
Glucose, Bld: 102 mg/dL — ABNORMAL HIGH (ref 70–99)
Potassium: 3.4 mmol/L — ABNORMAL LOW (ref 3.5–5.1)
Sodium: 143 mmol/L (ref 135–145)
Total Bilirubin: 0.4 mg/dL (ref 0.3–1.2)
Total Protein: 5.5 g/dL — ABNORMAL LOW (ref 6.5–8.1)

## 2019-06-21 LAB — HEMOGLOBIN AND HEMATOCRIT, BLOOD
HCT: 30 % — ABNORMAL LOW (ref 36.0–46.0)
Hemoglobin: 8.8 g/dL — ABNORMAL LOW (ref 12.0–15.0)

## 2019-06-21 LAB — C-REACTIVE PROTEIN: CRP: 6.7 mg/dL — ABNORMAL HIGH (ref <1.0)

## 2019-06-21 LAB — MAGNESIUM: Magnesium: 2.1 mg/dL (ref 1.7–2.4)

## 2019-06-21 MED ORDER — SODIUM CHLORIDE 0.9% FLUSH: 10.0000 mL | INTRAVENOUS | Status: DC | PRN

## 2019-06-21 MED ORDER — POTASSIUM CHLORIDE CRYS ER 20 MEQ PO TBCR
40.0000 meq | EXTENDED_RELEASE_TABLET | Freq: Once | ORAL | Status: AC
  Administered 2019-06-21: 40 meq via ORAL
  Filled 2019-06-21: qty 2

## 2019-06-21 MED ORDER — SODIUM CHLORIDE 0.9% FLUSH: 10.0000 mL | Freq: Two times a day (BID) | INTRAVENOUS | Status: DC

## 2019-06-21 MED ORDER — APIXABAN 5 MG PO TABS: 10.0000 mg | ORAL_TABLET | Freq: Two times a day (BID) | ORAL | Status: DC

## 2019-06-21 MED ORDER — APIXABAN 5 MG PO TABS: 10.0000 mg | ORAL_TABLET | Freq: Two times a day (BID) | ORAL | 0 refills | Status: DC

## 2019-06-21 MED ORDER — CEFTRIAXONE IV (FOR PTA / DISCHARGE USE ONLY): 2.0000 g | INTRAVENOUS | 0 refills | Status: AC

## 2019-06-21 MED ORDER — APIXABAN 5 MG PO TABS: 5.0000 mg | ORAL_TABLET | Freq: Two times a day (BID) | ORAL | Status: DC

## 2019-06-21 MED ORDER — CHLORHEXIDINE GLUCONATE CLOTH 2 % EX PADS: 6.0000 | MEDICATED_PAD | Freq: Every day | CUTANEOUS | Status: DC

## 2019-06-21 MED ORDER — APIXABAN 5 MG PO TABS: 5.0000 mg | ORAL_TABLET | Freq: Two times a day (BID) | ORAL | 0 refills | Status: DC

## 2019-06-21 NOTE — Progress Notes (Signed)
ANTICOAGULATION CONSULT NOTE - Initial Consult  Pharmacy Consult for apixaban Indication: pulmonary embolus  Allergies  Allergen Reactions  . Codeine Sulfate Shortness Of Breath  . Celecoxib Other (See Comments)    Caused vaginal bleeding  . Erythromycin Base Nausea And Vomiting    Patient Measurements: Height: _0  (157.5 cm) Weight: 265 lb (120.2 kg) IBW/kg (Calculated) : 50.1  Vital Signs: Temp: 98.6 F (37 C) (12/16 1426) Temp Source: Oral (12/16 1426) BP: 148/70 (12/16 1426) Pulse Rate: 91 (12/16 1426)  Labs: Recent Labs    06/19/19 0530 06/20/19 0608 06/20/19 2030 06/21/19 0517  HGB 8.6* 8.3* 8.2* 8.8*  8.9*  HCT 29.4* 28.7* 28.1* 30.0*  30.6*  PLT 289 270  --  358  APTT 52*  --   --   --   CREATININE 1.14* 1.01*  --  0.82    Estimated Creatinine Clearance: 65.2 mL/min (by C-G formula based on SCr of 0.82 mg/dL).   Medical History: Past Medical History:  Diagnosis Date  . Allergy   . Anemia   . Anxiety   . Arthritis   . Arthritis of sacroiliac joint of both sides 11/12/2017  . Bilateral pulmonary embolism ([place]) 06/23/2017  . Chronic diastolic CHF (congestive heart failure) ([facility])    a. Echo 1/16:  mild LVH, EF normal, grade 1 DD, MAC  . Chronic venous insufficiency    chronic LE edema  . DDD (degenerative disc disease), lumbar 11/12/2017  . Degenerative joint disease (DJD) of hip, Bilateral  11/12/2017  . Depression   . Fibromyalgia    constant pain  . Hx of cardiac catheterization    a. LHC in [place] "ok" per patient with mild plaque in a single vessel - records not available  . Hx of cardiovascular stress test    a. Nuclear study in 2008 normal  . Hx of colonic polyps   . Hypertension   . Hypertriglyceridemia   . Impaired fasting glucose   . PONV (postoperative nausea and vomiting)   . PUD (peptic ulcer disease)    hx of gastric ulcer  . Pulmonary emboli ([place]) 06/2017  . Vitamin B12 deficiency     Assessment: Patient admitted with SOB and  diagnosed with acute bilateral PE. Pt has PMH significant for PE in 2018, previously prescribed apixaban which was stopped in March 2020. PMH of symptomatic anemia with GIB. Pt has been on therapeutic LMWH during admission, pharmacy consulted to convert to apixaban.  Last dose of enoxaparin 12/16 @ 0819   Plan:   Apixaban 10 mg PO BID x 7 days followed by apixaban 5 mg PO BID  Pt provided with medication handout and counseling. Has been on apixaban in the past and already used manufacturer coupon  CBC q72 hours while inpatient  Lenis Noon, PharmD 06/21/2019,4:58 PM

## 2019-06-21 NOTE — Discharge Summary (Signed)
Physician Discharge Summary  Chelsea Keller ZES:[id number] DOB: 1935-01-05 DOA: 06/17/2019  PCP: Chelsea Carbon, MD  Admit date: 06/17/2019 Discharge date: 06/21/2019  Admitted From: Home Disposition:  Home  Recommendations for Outpatient Follow-up:  1. Follow up with PCP in 1-2 weeks 2. Follow up with Cardiology as scheduled 3. Weekly CBC and BMP with abx 4. PICC maintenance, please pull PICC after completing abx on 07/31/19  Home Health:RN, PT   Discharge Condition:Stable CODE STATUS:Full Diet recommendation: Heart healthy   Brief/Interim Summary: 84 y.o.femalewith past medical history of symptomatic anemia with history of GI bleed and iron deficiency anemia requiring IV iron and follows with hematology, pulmonary embolism in 2018 previously on Eliquis which was discontinued in March 2020 by her hematologist Dr. Lindi Keller as repeat CT chest was negative and she has history of GI bleeding, chronic diastolic heart failure, chronic bilateral lower extremity edema, who presented to the ED with fever, shortness of breath, cough, nausea which was progressively worsening over a few days.  EMS called, O2 sat in the 60s. In the ED, sats in the low 90s on 3L via [place]. Labs showed WBC 18k, K 3.2, procalcitonin 1.09, lactate 2.5, D-dimer 10.41, Covid negativeVital signs on admission showed Tm 101.9, hr 112, RR 30. Chest x-ray unremarkable. Patient admitted for further management on 12/13.  He was found to have acute hypoxic respiratory failure secondary to recurrent bilateral PE without RV strain or dynamic instability and started on therapeutic Lovenox, found to have sepsis secondary to Streptococcus gordonii bacteremia with possible source right lower extremity as she had a recent open wound and now cellulitis. transthoracic echo negative for vegetation.  She has remained afebrile and hemodynamically stable on IV ceftriaxone on room air.   Discharge Diagnoses:  Principal Problem:   Acute  respiratory failure with hypoxia (HCC) Active Problems:   Essential hypertension   Chronic diastolic CHF (congestive heart failure) (HCC)   CKD (chronic kidney disease) stage 3, GFR 30-59 ml/min   History of pulmonary embolism   SIRS (systemic inflammatory response syndrome) (HCC)   Suspected COVID-19 virus infection   Pulmonary embolism (HCC)   Acute hypoxic respiratory failure 2/2bilateralPE, likely secondary to septic emboli -Currently afebrile and hemodynamically stable on room air CRP 3.2, ferritin 10 -D-dimer elevated at 10.41 -Chest x-ray unremarkable --EKG with no acute ST changes, sinus tachycardia CTA chest showed bilateral PE of moderate clot burden, no right ventricular strain, no pulmonary infarction or pneumonia -MRSA PCR negative -Echoshowed EF of 60-65%, grade 1DD without vegetations -would have pt follow up with primary hematologist -On lovenox. Will transition to eliquis on d/c  Right lower extremity cellulitis with recent right lower extremity open wound bilateral lower extremity edema -Doppler negative DVT -Open wound appears healed currently but was reportedly previously draining within the past several weeks, likely source of below bacteremia -cont abx per below  Sepsis secondary to streptococcus gordonii bacteremia -Noted to be febrile, tachycardic, tachypneicon admission currently stable on room air -Lactic acidosis,resolved -BC x2grew 3/4 GPC, streptococcus gordonii  -Chest x-ray/CT chest unremarkable for pneumonia -TTE without vegetations -Consider dental work-up outpatient -ID recommendation for 6 weeks of rocephin as oupt, through 07/31/19 -PICC placed on 06/21/19  Normocytic anemia, concern for recurrent GI bleed in setting of therapeutic Lovenox -History of chronic GI bleed with hemoglobin slowly trending down from admission -Colonoscopy 03/23/2018 with multiple nonbleeding colonic angiodysplastic lesions which were treated with APC,  diverticulosis in left colon, terminal hemorrhoids but otherwise normal. -Hgb initially trended down, however trended  back up -No evidence of acute bleeding  CKD stage III -Last creatinine on 12/2018 was 0.93 Currently at baseline -Daily BMP  Anemia of CKD -Gets IV iron infusions with hematology  Hypertension -BPstable -Held home triamterene-hydrochlorothiazide while in hospital  Chronic diastolic HF -Chronic BLE edema -Resume home meds on d/c  Morbid obesity Advised lifestyle modification   Discharge Instructions  Discharge Instructions    Home infusion instructions [facility] May follow [place] Dosing Protocol; May administer Cathflo as needed to maintain patency of vascular access device.; Flushing of vascular access device: per [facility] Protocol: 0.9% NaCl pre/post medica...   Complete by: As directed    Instructions: May follow [place] Dosing Protocol   Instructions: May administer Cathflo as needed to maintain patency of vascular access device.   Instructions: Flushing of vascular access device: per [facility] Protocol: 0.9% NaCl pre/post medication administration and prn patency; Heparin 100 u/ml, 62m for implanted ports and Heparin 10u/ml, 515mfor all other central venous catheters.   Instructions: May follow [facility] Anaphylaxis Protocol for First Dose Administration in the home: 0.9% NaCl at 25-50 ml/hr to maintain IV access for protocol meds. Epinephrine 0.3 ml IV/IM PRN and Benadryl 25-50 IV/IM PRN s/s of anaphylaxis.   Instructions: Chelsea Keller (RN) to assist per patient IV care needs in the home PRN.     Allergies as of 06/21/2019      Reactions   Codeine Sulfate Shortness Of Breath   Celecoxib Other (See Comments)   Caused vaginal bleeding   Erythromycin Base Nausea And Vomiting      Medication List    STOP taking these medications   aspirin 81 MG tablet     TAKE these medications   acetaminophen 500 MG  tablet Commonly known as: TYLENOL Take 1,000-1,500 mg by mouth every 6 (six) hours as needed for moderate pain or headache (pain).   apixaban 5 MG Tabs tablet Commonly known as: ELIQUIS Take 2 tablets (10 mg total) by mouth 2 (two) times daily for 7 days.   apixaban 5 MG Tabs tablet Commonly known as: ELIQUIS Take 1 tablet (5 mg total) by mouth 2 (two) times daily. '10mg'$  po BID x 7 days then '5mg'$  po BID afterwards Start taking on: June 28, 2019   cefTRIAXone  IVPB Commonly known as: ROCEPHIN Inject 2 g into the vein daily. Indication: S. gordonii bacteremia and possible endocarditis Last Day of Therapy: 07/31/2019 Labs - Once weekly:  CBC/D and BMP, Labs - Every other week:  ESR and CRP Start taking on: June 22, 2019   furosemide 80 MG tablet Commonly known as: LASIX Take 80 mg by mouth daily.   gabapentin 300 MG capsule Commonly known as: NEURONTIN Take 1 capsule in the morning and 2 capsules at bedtime   potassium chloride SA 20 MEQ tablet Commonly known as: KLOR-CON TAKE 2 TABLETS (40 MEQ TOTAL) DAILY What changed: See the new instructions.   silver sulfADIAZINE 1 % cream Commonly known as: SILVADENE Apply 1 application topically daily.   tiZANidine 2 MG tablet Commonly known as: ZANAFLEX Take 1 tablet (2 mg total) by mouth 3 (three) times daily as needed for muscle spasms.   traMADol 50 MG tablet Commonly known as: ULTRAM Take 1 tablet (50 mg total) by mouth 3 (three) times daily as needed for moderate pain.   triamterene-hydrochlorothiazide 37.5-25 MG tablet Commonly known as: MAXZIDE-25 Take 1 tablet by mouth daily.  Home Infusion Instuctions  (From admission, onward)         Start     Ordered   06/21/19 0000  Home infusion instructions [facility] May follow [place] Dosing Protocol; May administer Cathflo as needed to maintain patency of vascular access device.; Flushing of vascular access device: per [facility] Protocol: 0.9%  NaCl pre/post medica...    Question Answer Comment  Instructions May follow [place] Dosing Protocol   Instructions May administer Cathflo as needed to maintain patency of vascular access device.   Instructions Flushing of vascular access device: per [facility] Protocol: 0.9% NaCl pre/post medication administration and prn patency; Heparin 100 u/ml, 49m for implanted ports and Heparin 10u/ml, 569mfor all other central venous catheters.   Instructions May follow [facility] Anaphylaxis Protocol for First Dose Administration in the home: 0.9% NaCl at 25-50 ml/hr to maintain IV access for protocol meds. Epinephrine 0.3 ml IV/IM PRN and Benadryl 25-50 IV/IM PRN s/s of anaphylaxis.   Instructions [facility] Keller (RN) to assist per patient IV care needs in the home PRN.      06/21/19 1622         Follow-up Information    [facility] Follow up.   Why: [facility]supp[facility]       [facility], [facility] Follow up.   Specialty: Home Health Services Why: [facility]ony[facility]ing/physical therapy       LeVenia CarbonMD. Schedule an appointment as soon as possible for a visit in 2 week(s).   Specialties: Internal Medicine, Pediatrics Contact information: [phone]        McBurnell BlanksMD .   Specialty: Cardiology Contact information: [address] [place] [phone]        Allergies  Allergen Reactions  . Codeine Sulfate Shortness Of Breath  . Celecoxib Other (See Comments)    Caused vaginal bleeding  . Erythromycin Base Nausea And Vomiting    Consultations:  ID  Procedures/Studies: CT ANGIO CHEST PE W OR WO CONTRAST  Result Date: 06/18/2019 CLINICAL DATA:  Short of breath, chest pain and fever EXAM: CT ANGIOGRAPHY CHEST WITH CONTRAST TECHNIQUE: Multidetector CT imaging of the chest was performed using the standard protocol during bolus administration of intravenous  contrast. Multiplanar CT image reconstructions and MIPs were obtained to evaluate the vascular anatomy. CONTRAST:  7545mMNIPAQUE IOHEXOL 350 MG/ML SOLN COMPARISON:  CT chest September 14, 2018 FINDINGS: Cardiovascular: Suboptimal exam due to respiratory motion and dispersion of the contrast bolus. However, with this caveat there is a filling defect within the proximal RIGHT lower lobe pulmonary artery (image 64/4) which branches into segmental arteries (image 66/4). Similar filling defect in the LEFT lobe pulmonary artery (image 70/4). Potential filling defect in the RIGHT upper lobe pulmonary artery on image 50/4. Are overall clot burden is moderate. No evidence of RIGHT ventricular strain with the LEFT ventricle to RIGHT ventricle ratio less than 1 period. Coronary artery calcification and aortic atherosclerotic calcification. Mediastinum/Nodes: No axillary or supraclavicular adenopathy. No mediastinal adenopathy. Lungs/Pleura: No pulmonary infarction. No pneumonia. Mild interlobular septal thickening. Motion degradation. Upper Abdomen: Limited view of the liver, kidneys, pancreas are unremarkable. Normal adrenal glands. Postcholecystectomy. Musculoskeletal: No aggressive osseous lesion. Review of the MIP images confirms the above findings. IMPRESSION: 1. Bilateral pulmonary emboli of moderate clot burden. No RIGHT ventricular strain. 2. No pulmonary infarction or pneumonia. 3. Coronary artery calcification and Aortic Atherosclerosis (  ICD10-I70.0). Critical Value/emergent results were called by telephone at the time of interpretation on 06/18/2019 at 4:44 am to providerFloyd, MD, who verbally acknowledged these results. Electronically Signed   By: Suzy Bouchard M.D.   On: 06/18/2019 04:45   DG Chest Port 1 View  Result Date: 06/17/2019 CLINICAL DATA:  Shortness of breath and chest pain EXAM: PORTABLE CHEST 1 VIEW COMPARISON:  08/11/2018 FINDINGS: Cardiac shadow is stable. The lungs are clear bilaterally.  Aortic calcifications are noted. No bony abnormality is noted. IMPRESSION: No acute abnormality noted. Aortic Atherosclerosis (ICD10-I70.0). Electronically Signed   By: Inez Catalina M.D.   On: 06/17/2019 23:35   DG FLUORO GUIDED NEEDLE PLC ASPIRATION/INJECTION LOC  Result Date: 05/24/2019 CLINICAL DATA:  Left hip pain. FLUOROSCOPY TIME:  Radiation Exposure Index (as provided by the fluoroscopic device): 2.0 mGy Fluoroscopy Time:  4 seconds Number of Acquired Images:  0 PROCEDURE: The risks and benefits of the procedure were discussed with the patient, and written informed consent was obtained. The patient stated no history of allergy to contrast media. A formal timeout procedure was performed with the patient according to departmental protocol. The patient was placed supine on the fluoroscopy table and the left hip joint was identified under fluoroscopy. The skin overlying the left hip joint was subsequently cleaned with Betadine and a sterile drape was placed over the area of interest. 2 ml 1% Lidocaine was used to anesthetize the skin around the needle insertion site. A 3.5 inch 22 gauge spinal needle was inserted into the left hip joint under fluoroscopy. Diagnostic injection of 1 ml Isovue-M 200 iodinated contrast demonstrates intra-articular spread without intravascular component. '120mg'$  Depo-Medrol and 5 ml Sensorcaine 0.5% were then administered into the left hip joint. The needle was removed and hemostasis was achieved. There were no immediate complications. IMPRESSION: Technically successful left hip steroid injection. Electronically Signed   By: Titus Dubin M.D.   On: 05/24/2019 12:14   ECHOCARDIOGRAM COMPLETE  Result Date: 06/19/2019   ECHOCARDIOGRAM REPORT   Patient Name:   Chelsea Keller Date of Exam: 06/19/2019 Medical Rec #:  [id number]     Height:       62.0 in Accession #:    [id number]    Weight:       265.0 lb Date of Birth:  16-Feb-1935     BSA:          2.15 m Patient Age:    43 years       BP:           139/63 mmHg Patient Gender: F             HR:           88 bpm. Exam Location:  Inpatient Procedure: 2D Echo, Color Doppler, Cardiac Doppler and Intracardiac            Opacification Agent Indications:    I26.02 Pulmonary embolus  History:        Patient has prior history of Echocardiogram examinations, most                 recent 06/23/2017. CHF; Risk Factors:Hypertension.  Sonographer:    Raquel Sarna Senior RDCS Referring Phys: [id number] [place]  1. Left ventricular ejection fraction, by visual estimation, is 60 to 65%. The left ventricle has normal function. There is mildly increased left ventricular hypertrophy.  2. Left ventricular diastolic parameters are consistent with Grade I diastolic dysfunction (impaired relaxation).  3. The left ventricle has  no regional wall motion abnormalities.  4. Global right ventricle has normal systolic function.The right ventricular size is normal. No increase in right ventricular wall thickness.  5. Left atrial size was normal.  6. Right atrial size was normal.  7. Mild mitral annular calcification.  8. The mitral valve is abnormal. Mild mitral valve regurgitation.  9. The tricuspid valve is normal in structure. Tricuspid valve regurgitation is mild. 10. The aortic valve is grossly normal. Aortic valve regurgitation is not visualized. 11. The pulmonic valve was normal in structure. Pulmonic valve regurgitation is not visualized. 12. Mildly elevated pulmonary artery systolic pressure. FINDINGS  Left Ventricle: Left ventricular ejection fraction, by visual estimation, is 60 to 65%. The left ventricle has normal function. The left ventricle has no regional wall motion abnormalities. There is mildly increased left ventricular hypertrophy. Left ventricular diastolic parameters are consistent with Grade I diastolic dysfunction (impaired relaxation). Right Ventricle: The right ventricular size is normal. No increase in right ventricular wall  thickness. Global RV systolic function is has normal systolic function. The tricuspid regurgitant velocity is 2.97 m/s, and with an assumed right atrial pressure  of 3 mmHg, the estimated right ventricular systolic pressure is mildly elevated at 38.3 mmHg. Left Atrium: Left atrial size was normal in size. Right Atrium: Right atrial size was normal in size Pericardium: There is no evidence of pericardial effusion. Mitral Valve: The mitral valve is abnormal. Mild mitral annular calcification. Mild mitral valve regurgitation. MV peak gradient, 12.8 mmHg. Tricuspid Valve: The tricuspid valve is normal in structure. Tricuspid valve regurgitation is mild. Aortic Valve: The aortic valve is grossly normal. Aortic valve regurgitation is not visualized. Pulmonic Valve: The pulmonic valve was normal in structure. Pulmonic valve regurgitation is not visualized. Pulmonic regurgitation is not visualized. Aorta: The aortic root is normal in size and structure. IAS/Shunts: No atrial level shunt detected by color flow Doppler.  LEFT VENTRICLE PLAX 2D LVIDd:         4.60 cm  Diastology LVIDs:         2.90 cm  LV e' lateral:   6.64 cm/s LV PW:         1.10 cm  LV E/e' lateral: 20.5 LV IVS:        1.20 cm  LV e' medial:    8.16 cm/s LVOT diam:     1.80 cm  LV E/e' medial:  16.7 LV SV:         65 ml LV SV Index:   27.49 LVOT Area:     2.54 cm  RIGHT VENTRICLE RV S prime:     15.05 cm/s TAPSE (M-mode): 2.5 cm LEFT ATRIUM             Index       RIGHT ATRIUM           Index LA diam:        2.90 cm 1.35 cm/m  RA Area:     13.40 cm LA Vol (A2C):   82.4 ml 38.24 ml/m RA Volume:   28.10 ml  13.04 ml/m LA Vol (A4C):   53.7 ml 24.92 ml/m LA Biplane Vol: 69.3 ml 32.16 ml/m  AORTIC VALVE LVOT Vmax:   120.00 cm/s LVOT Vmean:  81.000 cm/s LVOT VTI:    0.222 m  AORTA Ao Root diam: 2.70 cm Ao Asc diam:  2.90 cm MITRAL VALVE  TRICUSPID VALVE MV Area (PHT): 4.57 cm             TR Peak grad:   35.3 mmHg MV Peak grad:  12.8  mmHg            TR Vmax:        297.00 cm/s MV Mean grad:  7.0 mmHg MV Vmax:       1.79 m/s             SHUNTS MV Vmean:      123.0 cm/s           Systemic VTI:  0.22 m MV VTI:        0.34 m               Systemic Diam: 1.80 cm MV PHT:        48.14 msec MV Decel Time: 166 msec MV E velocity: 136.00 cm/s 103 cm/s  Dorris Carnes MD Electronically signed by Dorris Carnes MD Signature Date/Time: 06/19/2019/3:58:45 PM    Final    VAS [place] LOWER EXTREMITY VENOUS (DVT)  Result Date: 06/20/2019  Lower Venous Study Indications: Pulmonary embolism.  Limitations: Poor ultrasound/tissue interface, body habitus and patient pain tolerance. Comparison Study: 06/23/17 previous Performing Technologist: Abram Sander RVS  Examination Guidelines: A complete evaluation includes B-mode imaging, spectral Doppler, color Doppler, and power Doppler as needed of all accessible portions of each vessel. Bilateral testing is considered an integral part of a complete examination. Limited examinations for reoccurring indications may be performed as noted.  +---------+---------------+---------+-----------+----------+--------------+ RIGHT    CompressibilityPhasicitySpontaneityPropertiesThrombus Aging +---------+---------------+---------+-----------+----------+--------------+ CFV      Full           Yes      Yes                                 +---------+---------------+---------+-----------+----------+--------------+ SFJ      Full                                                        +---------+---------------+---------+-----------+----------+--------------+ FV Prox  Full                                                        +---------+---------------+---------+-----------+----------+--------------+ FV Mid   Full                                                        +---------+---------------+---------+-----------+----------+--------------+ FV DistalFull                                                         +---------+---------------+---------+-----------+----------+--------------+ PFV      Full                                                        +---------+---------------+---------+-----------+----------+--------------+  POP      Full           Yes      Yes                                 +---------+---------------+---------+-----------+----------+--------------+ PTV      Full                                                        +---------+---------------+---------+-----------+----------+--------------+ PERO                                                  Not visualized +---------+---------------+---------+-----------+----------+--------------+   +---------+---------------+---------+-----------+----------+-------------------+ LEFT     CompressibilityPhasicitySpontaneityPropertiesThrombus Aging      +---------+---------------+---------+-----------+----------+-------------------+ CFV      Full           Yes      Yes                                      +---------+---------------+---------+-----------+----------+-------------------+ SFJ      Full                                                             +---------+---------------+---------+-----------+----------+-------------------+ FV Prox  Full                                                             +---------+---------------+---------+-----------+----------+-------------------+ FV Mid   Full                                                             +---------+---------------+---------+-----------+----------+-------------------+ FV Distal               Yes      Yes                  could not tolerate                                                        compression         +---------+---------------+---------+-----------+----------+-------------------+ PFV      Full                                                              +---------+---------------+---------+-----------+----------+-------------------+  POP      Full           Yes      Yes                                      +---------+---------------+---------+-----------+----------+-------------------+ PTV      Full                                                             +---------+---------------+---------+-----------+----------+-------------------+ PERO                                                  Not visualized      +---------+---------------+---------+-----------+----------+-------------------+     Summary: Right: There is no evidence of deep vein thrombosis in the lower extremity. No cystic structure found in the popliteal fossa. Left: There is no evidence of deep vein thrombosis in the lower extremity. However, portions of this examination were limited- see technologist comments above. No cystic structure found in the popliteal fossa.  *See table(s) above for measurements and observations. Electronically signed by Deitra Mayo MD on 06/20/2019 at 2:40:42 PM.    Final    [place] EKG SITE RITE  Result Date: 06/21/2019 If Site Rite image not attached, placement could not be confirmed due to current cardiac rhythm.   Subjective: Denies sob this AM  Discharge Exam: Vitals:   06/21/19 0738 06/21/19 1426  BP: 140/72 (!) 148/70  Pulse: 86 91  Resp:  18  Temp: 98 F (36.7 C) 98.6 F (37 C)  SpO2: 100% 97%   Vitals:   06/20/19 1246 06/20/19 2240 06/21/19 0738 06/21/19 1426  BP: (!) 151/58 134/62 140/72 (!) 148/70  Pulse: 88 91 86 91  Resp:    18  Temp: 98.1 F (36.7 C) 98.8 F (37.1 C) 98 F (36.7 C) 98.6 F (37 C)  TempSrc: Oral Oral Oral Oral  SpO2: 99% 95% 100% 97%  Weight:      Height:        General: Pt is alert, awake, not in acute distress Cardiovascular: RRR, S1/S2 +, no rubs, no gallops Respiratory: CTA bilaterally, no wheezing, no rhonchi Abdominal: Soft, NT, ND, bowel sounds + Extremities: no edema, no  cyanosis   The results of significant diagnostics from this hospitalization (including imaging, microbiology, ancillary and laboratory) are listed below for reference.     Microbiology: Recent Results (from the past 240 hour(s))  Culture, blood (Routine x 2)     Status: Abnormal   Collection Time: 06/17/19 11:34 PM   Specimen: BLOOD LEFT FOREARM  Result Value Ref Range Status   Specimen Description   Final    BLOOD LEFT FOREARM Performed at [facility], [address] [place], [place] [address]    Special Requests   Final    BOTTLES DRAWN AEROBIC AND ANAEROBIC Blood Culture results may not be optimal due to an excessive volume of blood received in culture bottles Performed at [facility] [address] [place],  [address]    Culture  Setup Time  Final    GRAM POSITIVE COCCI IN BOTH AEROBIC AND ANAEROBIC BOTTLES CRITICAL VALUE NOTED.  VALUE IS CONSISTENT WITH PREVIOUSLY REPORTED AND CALLED VALUE.    Culture (A)  Final    STREPTOCOCCUS GORDONII SUSCEPTIBILITIES PERFORMED ON PREVIOUS CULTURE WITHIN THE LAST 5 DAYS. Performed at [facility], [place] [address] [address], [place], [place] [address]    Report Status 06/21/2019 FINAL  Final  Culture, blood (Routine x 2)     Status: Abnormal   Collection Time: 06/17/19 11:34 PM   Specimen: BLOOD  Result Value Ref Range Status   Specimen Description   Final    BLOOD RIGHT ANTECUBITAL Performed at [facility], [place] [address] [place], [place] [address]    Special Requests   Final    BOTTLES DRAWN AEROBIC AND ANAEROBIC Blood Culture adequate volume Performed at [facility] [address], [place], [place] [address]    Culture  Setup Time   Final    AEROBIC BOTTLE ONLY GRAM POSITIVE COCCI CRITICAL RESULT CALLED TO, READ BACK BY AND VERIFIED WITH: PHRMD M RENZ '@0710'$  06/19/19 BY S GEZAHEGN Performed at [facility], [address] [place],  [address]     Culture STREPTOCOCCUS GORDONII (A)  Final   Report Status 06/21/2019 FINAL  Final   Organism ID, Bacteria STREPTOCOCCUS GORDONII  Final      Susceptibility   Streptococcus gordonii - MIC*    PENICILLIN <=0.06 SENSITIVE Sensitive     CEFTRIAXONE <=0.12 SENSITIVE Sensitive     ERYTHROMYCIN <=0.12 SENSITIVE Sensitive     LEVOFLOXACIN 0.5 SENSITIVE Sensitive     VANCOMYCIN 0.5 SENSITIVE Sensitive     * STREPTOCOCCUS GORDONII  Blood Culture ID Panel (Reflexed)     Status: Abnormal   Collection Time: 06/17/19 11:34 PM  Result Value Ref Range Status   Enterococcus species NOT DETECTED NOT DETECTED Final   Listeria monocytogenes NOT DETECTED NOT DETECTED Final   Staphylococcus species NOT DETECTED NOT DETECTED Final   Staphylococcus aureus (BCID) NOT DETECTED NOT DETECTED Final   Streptococcus species DETECTED (A) NOT DETECTED Final    Comment: Not Enterococcus species, Streptococcus agalactiae, Streptococcus pyogenes, or Streptococcus pneumoniae. CRITICAL RESULT CALLED TO, READ BACK BY AND VERIFIED WITH: PHRMD M RENZ '@0710'$  06/19/19 BY S GEZAHEGN    Streptococcus agalactiae NOT DETECTED NOT DETECTED Final   Streptococcus pneumoniae NOT DETECTED NOT DETECTED Final   Streptococcus pyogenes NOT DETECTED NOT DETECTED Final   Acinetobacter baumannii NOT DETECTED NOT DETECTED Final   Enterobacteriaceae species NOT DETECTED NOT DETECTED Final   Enterobacter cloacae complex NOT DETECTED NOT DETECTED Final   Escherichia coli NOT DETECTED NOT DETECTED Final   Klebsiella oxytoca NOT DETECTED NOT DETECTED Final   Klebsiella pneumoniae NOT DETECTED NOT DETECTED Final   Proteus species NOT DETECTED NOT DETECTED Final   Serratia marcescens NOT DETECTED NOT DETECTED Final   Haemophilus influenzae NOT DETECTED NOT DETECTED Final   Neisseria meningitidis NOT DETECTED NOT DETECTED Final   Pseudomonas aeruginosa NOT DETECTED NOT DETECTED Final   Candida albicans NOT DETECTED NOT DETECTED Final   Candida  glabrata NOT DETECTED NOT DETECTED Final   Candida krusei NOT DETECTED NOT DETECTED Final   Candida parapsilosis NOT DETECTED NOT DETECTED Final   Candida tropicalis NOT DETECTED NOT DETECTED Final    Comment: Performed at [facility], [place] [address], [place], [place] [address]  SARS CORONAVIRUS 2 (TAT 6-24 HRS) Nasopharyngeal Nasopharyngeal Swab  Status: None   Collection Time: 06/18/19  1:01 AM   Specimen: Nasopharyngeal Swab  Result Value Ref Range Status   SARS Coronavirus 2 NEGATIVE NEGATIVE Final    Comment: (NOTE) SARS-CoV-2 target nucleic acids are NOT DETECTED. The SARS-CoV-2 RNA is generally detectable in upper and lower respiratory specimens during the acute phase of infection. Negative results do not preclude SARS-CoV-2 infection, do not rule out co-infections with other pathogens, and should not be used as the sole basis for treatment or other patient management decisions. Negative results must be combined with clinical observations, patient history, and epidemiological information. The expected result is Negative. Fact Sheet for Patients: SugarRoll.be Fact Sheet for Healthcare Providers: https://www.woods-mathews.com/ This test is not yet approved or cleared by the [place] FDA and  has been authorized for detection and/or diagnosis of SARS-CoV-2 by FDA under an Emergency Use Authorization (EUA). This EUA will remain  in effect (meaning this test can be used) for the duration of the COVID-19 declaration under Section 56 4(b)(1) of the Act, 21 U.S.C. section 360bbb-3(b)(1), unless the authorization is terminated or revoked sooner. Performed at [facility], [place] [address] [place], [place] [address]   MRSA PCR Screening     Status: None   Collection Time: 06/19/19  7:40 AM   Specimen: Nasal Mucosa; Nasopharyngeal  Result Value Ref Range Status   MRSA by PCR NEGATIVE NEGATIVE Final    Comment:        The  GeneXpert MRSA Assay (FDA approved for NASAL specimens only), is one component of a comprehensive MRSA colonization surveillance program. It is not intended to diagnose MRSA infection nor to guide or monitor treatment for MRSA infections. Performed at [facility], [place] [address], [place], [place] [address]   Culture, blood (routine x 2)     Status: None (Preliminary result)   Collection Time: 06/19/19  8:30 AM   Specimen: BLOOD  Result Value Ref Range Status   Specimen Description   Final    BLOOD RIGHT ARM Performed at [facility] [address], [place], [place] [address]    Special Requests   Final    BOTTLES DRAWN AEROBIC AND ANAEROBIC Blood Culture adequate volume Performed at [facility] [address] [place], [place] [address]    Culture   Final    NO GROWTH 2 DAYS Performed at [facility] [address] [place], [place] [address]    Report Status PENDING  Incomplete  Culture, blood (routine x 2)     Status: None (Preliminary result)   Collection Time: 06/19/19  8:30 AM   Specimen: BLOOD  Result Value Ref Range Status   Specimen Description   Final    BLOOD RIGHT HAND Performed at [facility] [address], [place], [place] [address]    Special Requests   Final    BOTTLES DRAWN AEROBIC AND ANAEROBIC Blood Culture adequate volume Performed at [facility] [address], [place], [place] [address]    Culture   Final    NO GROWTH 2 DAYS Performed at [facility] [address], [place],  [address]    Report Status PENDING  Incomplete  Culture, blood (routine x 2)     Status: None (Preliminary result)   Collection Time: 06/20/19  6:54 PM   Specimen: Right Antecubital; Blood  Result Value Ref Range Status   Specimen Description   Final  RIGHT ANTECUBITAL Performed at [facility] [address] [place],  [place] [address]    Special Requests   Final    BOTTLES DRAWN AEROBIC AND ANAEROBIC Blood Culture adequate volume Performed at [facility] [address] [place], [place] [address]    Culture   Final    NO GROWTH < 24 HOURS Performed at [facility] [address] [place], [place] [address]    Report Status PENDING  Incomplete  Culture, blood (routine x 2)     Status: None (Preliminary result)   Collection Time: 06/20/19  6:54 PM   Specimen: BLOOD RIGHT HAND  Result Value Ref Range Status   Specimen Description   Final    BLOOD RIGHT HAND Performed at [facility] [address], [place], [place] [address]    Special Requests   Final    BOTTLES DRAWN AEROBIC AND ANAEROBIC Blood Culture adequate volume Performed at [facility] [address] [place], [place] [address]    Culture   Final    NO GROWTH < 24 HOURS Performed at [facility] [address], [place],  [address]    Report Status PENDING  Incomplete     Labs: BNP (last 3 results) Recent Labs    08/11/18 1908  BNP 325.4*   Basic Metabolic Panel: Recent Labs  Lab 06/17/19 2333 06/18/19 1331 06/19/19 0530 06/20/19 0608 06/21/19 0517  NA 141 139 139 139 143  K 3.2* 3.9 3.8 3.5 3.4*  CL 102 100 102 103 107  CO2 '27 28 28 27 26  '$ GLUCOSE 122* 123* 101* 100* 102*  BUN 25* 30* 27* 27* 24*  CREATININE 1.23* 1.47* 1.14* 1.01* 0.82  CALCIUM 9.0 8.4* 8.2* 8.2* 8.3*  MG  --   --   --   --  2.1   Liver Function Tests: Recent Labs  Lab 06/17/19 2333 06/18/19 1331 06/19/19 0530 06/20/19 0608 06/21/19 0517  AST 24 34 37 26 23  ALT '16 20 18 17 15  '$ ALKPHOS 99 95 82 78 86  BILITOT 1.5* 1.0 1.0 0.7 0.4  PROT 6.4* 6.0* 5.3* 5.5* 5.5*  ALBUMIN 3.5 3.1* 2.7* 2.7* 2.6*   No results for input(s): LIPASE, AMYLASE in the last 168 hours. No results for input(s): AMMONIA in the last 168 hours. CBC: Recent Labs  Lab 06/17/19 2333  06/19/19 0530 06/20/19 0608 06/20/19 2030 06/21/19 0517  WBC 18.3* 14.9* 8.8  --  9.7  NEUTROABS 17.1* 11.8* 6.4  --  6.8  HGB 10.1* 8.6* 8.3* 8.2* 8.8*  8.9*  HCT 33.3* 29.4* 28.7* 28.1* 30.0*  30.6*  MCV 89.5 91.3 91.7  --  91.3  PLT 308 289 270  --  358   Cardiac Enzymes: No results for input(s): CKTOTAL, CKMB, CKMBINDEX, TROPONINI in the last 168 hours. BNP: Invalid input(s): POCBNP CBG: No results for input(s): GLUCAP in the last 168 hours. D-Dimer Recent Labs    06/19/19 0530  DDIMER 4.88*   Hgb A1c No results for input(s): HGBA1C in the last 72 hours. Lipid Profile No results for input(s): CHOL, HDL, LDLCALC, TRIG, CHOLHDL, LDLDIRECT in the last 72 hours. Thyroid function studies No results for input(s): TSH, T4TOTAL, T3FREE, THYROIDAB in the last 72 hours.  Invalid input(s): FREET3 Anemia work up No results for input(s): VITAMINB12, FOLATE, FERRITIN, TIBC, IRON, RETICCTPCT in the last 72 hours. Urinalysis    Component Value Date/Time   COLORURINE YELLOW  06/18/2019 0515   APPEARANCEUR HAZY (A) 06/18/2019 0515   LABSPEC 1.016 06/18/2019 0515   PHURINE 7.0 06/18/2019 0515   GLUCOSEU NEGATIVE 06/18/2019 0515   HGBUR NEGATIVE 06/18/2019 0515   HGBUR negative 01/13/2010 1056   BILIRUBINUR NEGATIVE 06/18/2019 0515   BILIRUBINUR negative 02/11/2017 1709   KETONESUR NEGATIVE 06/18/2019 0515   PROTEINUR NEGATIVE 06/18/2019 0515   UROBILINOGEN 0.2 02/11/2017 1709   UROBILINOGEN 0.2 01/13/2010 1056   NITRITE POSITIVE (A) 06/18/2019 0515   LEUKOCYTESUR NEGATIVE 06/18/2019 0515   Sepsis Labs Invalid input(s): PROCALCITONIN,  WBC,  LACTICIDVEN Microbiology Recent Results (from the past 240 hour(s))  Culture, blood (Routine x 2)     Status: Abnormal   Collection Time: 06/17/19 11:34 PM   Specimen: BLOOD LEFT FOREARM  Result Value Ref Range Status   Specimen Description   Final    BLOOD LEFT FOREARM Performed at [facility], [place] [address]  [place], [place] [address]    Special Requests   Final    BOTTLES DRAWN AEROBIC AND ANAEROBIC Blood Culture results may not be optimal due to an excessive volume of blood received in culture bottles Performed at [facility] [address], [place], [place] [address]    Culture  Setup Time   Final    GRAM POSITIVE COCCI IN BOTH AEROBIC AND ANAEROBIC BOTTLES CRITICAL VALUE NOTED.  VALUE IS CONSISTENT WITH PREVIOUSLY REPORTED AND CALLED VALUE.    Culture (A)  Final    STREPTOCOCCUS GORDONII SUSCEPTIBILITIES PERFORMED ON PREVIOUS CULTURE WITHIN THE LAST 5 DAYS. Performed at [facility], [place] [address], [place], [place] [address]    Report Status 06/21/2019 FINAL  Final  Culture, blood (Routine x 2)     Status: Abnormal   Collection Time: 06/17/19 11:34 PM   Specimen: BLOOD  Result Value Ref Range Status   Specimen Description   Final    BLOOD RIGHT ANTECUBITAL Performed at [facility], [place] [address] [place], [place] [address]    Special Requests   Final    BOTTLES DRAWN AEROBIC AND ANAEROBIC Blood Culture adequate volume Performed at [facility] [address] [place], [place] [address]    Culture  Setup Time   Final    AEROBIC BOTTLE ONLY GRAM POSITIVE COCCI CRITICAL RESULT CALLED TO, READ BACK BY AND VERIFIED WITH: PHRMD M RENZ '@0710'$  06/19/19 BY S GEZAHEGN Performed at [facility], [address], [place], [place] [address]    Culture STREPTOCOCCUS GORDONII (A)  Final   Report Status 06/21/2019 FINAL  Final   Organism ID, Bacteria STREPTOCOCCUS GORDONII  Final      Susceptibility   Streptococcus gordonii - MIC*    PENICILLIN <=0.06 SENSITIVE Sensitive     CEFTRIAXONE <=0.12 SENSITIVE Sensitive     ERYTHROMYCIN <=0.12 SENSITIVE Sensitive     LEVOFLOXACIN 0.5 SENSITIVE Sensitive     VANCOMYCIN 0.5 SENSITIVE Sensitive     * STREPTOCOCCUS GORDONII  Blood Culture ID Panel (Reflexed)     Status: Abnormal   Collection  Time: 06/17/19 11:34 PM  Result Value Ref Range Status   Enterococcus species NOT DETECTED NOT DETECTED Final   Listeria monocytogenes NOT DETECTED NOT DETECTED Final   Staphylococcus species NOT DETECTED NOT DETECTED Final   Staphylococcus aureus (BCID) NOT DETECTED NOT DETECTED Final   Streptococcus species DETECTED (A) NOT DETECTED Final    Comment: Not Enterococcus species, Streptococcus agalactiae, Streptococcus pyogenes, or Streptococcus pneumoniae. CRITICAL  RESULT CALLED TO, READ BACK BY AND VERIFIED WITH: PHRMD M RENZ '@0710'$  06/19/19 BY S GEZAHEGN    Streptococcus agalactiae NOT DETECTED NOT DETECTED Final   Streptococcus pneumoniae NOT DETECTED NOT DETECTED Final   Streptococcus pyogenes NOT DETECTED NOT DETECTED Final   Acinetobacter baumannii NOT DETECTED NOT DETECTED Final   Enterobacteriaceae species NOT DETECTED NOT DETECTED Final   Enterobacter cloacae complex NOT DETECTED NOT DETECTED Final   Escherichia coli NOT DETECTED NOT DETECTED Final   Klebsiella oxytoca NOT DETECTED NOT DETECTED Final   Klebsiella pneumoniae NOT DETECTED NOT DETECTED Final   Proteus species NOT DETECTED NOT DETECTED Final   Serratia marcescens NOT DETECTED NOT DETECTED Final   Haemophilus influenzae NOT DETECTED NOT DETECTED Final   Neisseria meningitidis NOT DETECTED NOT DETECTED Final   Pseudomonas aeruginosa NOT DETECTED NOT DETECTED Final   Candida albicans NOT DETECTED NOT DETECTED Final   Candida glabrata NOT DETECTED NOT DETECTED Final   Candida krusei NOT DETECTED NOT DETECTED Final   Candida parapsilosis NOT DETECTED NOT DETECTED Final   Candida tropicalis NOT DETECTED NOT DETECTED Final    Comment: Performed at [facility], [place] [address], [place], [place] [address]  SARS CORONAVIRUS 2 (TAT 6-24 HRS) Nasopharyngeal Nasopharyngeal Swab     Status: None   Collection Time: 06/18/19  1:01 AM   Specimen: Nasopharyngeal Swab  Result Value Ref Range Status   SARS Coronavirus 2  NEGATIVE NEGATIVE Final    Comment: (NOTE) SARS-CoV-2 target nucleic acids are NOT DETECTED. The SARS-CoV-2 RNA is generally detectable in upper and lower respiratory specimens during the acute phase of infection. Negative results do not preclude SARS-CoV-2 infection, do not rule out co-infections with other pathogens, and should not be used as the sole basis for treatment or other patient management decisions. Negative results must be combined with clinical observations, patient history, and epidemiological information. The expected result is Negative. Fact Sheet for Patients: SugarRoll.be Fact Sheet for Healthcare Providers: https://www.woods-mathews.com/ This test is not yet approved or cleared by the [place] FDA and  has been authorized for detection and/or diagnosis of SARS-CoV-2 by FDA under an Emergency Use Authorization (EUA). This EUA will remain  in effect (meaning this test can be used) for the duration of the COVID-19 declaration under Section 56 4(b)(1) of the Act, 21 U.S.C. section 360bbb-3(b)(1), unless the authorization is terminated or revoked sooner. Performed at [facility], [place] [address] [place], [place] [address]   MRSA PCR Screening     Status: None   Collection Time: 06/19/19  7:40 AM   Specimen: Nasal Mucosa; Nasopharyngeal  Result Value Ref Range Status   MRSA by PCR NEGATIVE NEGATIVE Final    Comment:        The GeneXpert MRSA Assay (FDA approved for NASAL specimens only), is one component of a comprehensive MRSA colonization surveillance program. It is not intended to diagnose MRSA infection nor to guide or monitor treatment for MRSA infections. Performed at [facility], [place] [address], [place], [place] [address]   Culture, blood (routine x 2)     Status: None (Preliminary result)   Collection Time: 06/19/19  8:30 AM   Specimen: BLOOD  Result Value Ref Range Status    Specimen Description   Final    BLOOD RIGHT ARM Performed at [facility] [address] [place], [place] [address]    Special Requests   Final    BOTTLES DRAWN AEROBIC AND  ANAEROBIC Blood Culture adequate volume Performed at [facility] [address], [place], [place] [address]    Culture   Final    NO GROWTH 2 DAYS Performed at [name] [address] [place], [place] [address]    Report Status PENDING  Incomplete  Culture, blood (routine x 2)     Status: None (Preliminary result)   Collection Time: 06/19/19  8:30 AM   Specimen: BLOOD  Result Value Ref Range Status   Specimen Description   Final    BLOOD RIGHT HAND Performed at [facility] [address] [place], [place] [address]    Special Requests   Final    BOTTLES DRAWN AEROBIC AND ANAEROBIC Blood Culture adequate volume Performed at [facility] [address] [place], [place] [address]    Culture   Final    NO GROWTH 2 DAYS Performed at [facility] [address], [place], [place] [address]    Report Status PENDING  Incomplete  Culture, blood (routine x 2)     Status: None (Preliminary result)   Collection Time: 06/20/19  6:54 PM   Specimen: Right Antecubital; Blood  Result Value Ref Range Status   Specimen Description   Final    RIGHT ANTECUBITAL Performed at [facility] [address] [place], [place] [address]    Special Requests   Final    BOTTLES DRAWN AEROBIC AND ANAEROBIC Blood Culture adequate volume Performed at [facility] [address], [place], [place] [address]    Culture   Final    NO GROWTH < 24 HOURS Performed at [facility] [address] [place], [place] [address]    Report Status PENDING  Incomplete  Culture, blood (routine x 2)     Status: None (Preliminary result)   Collection Time: 06/20/19  6:54 PM   Specimen: BLOOD RIGHT HAND   Result Value Ref Range Status   Specimen Description   Final    BLOOD RIGHT HAND Performed at [facility] [address] [place], [place] [address]    Special Requests   Final    BOTTLES DRAWN AEROBIC AND ANAEROBIC Blood Culture adequate volume Performed at [facility] [address] [place], [place] [address]    Culture   Final    NO GROWTH < 24 HOURS Performed at [facility] [address] [place],  [address]    Report Status PENDING  Incomplete   Time spent: 34mn  SIGNED:   SMarylu Lund MD  [facility] 06/21/2019, 4:55 PM  If 7PM-7AM, please contact night-coverage

## 2019-06-21 NOTE — Progress Notes (Signed)
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 To discharging provider:  please sign these orders via discharge navigator,  Select New Orders & click on the button choice - Manage This Unsigned Work.     Thank you for allowing pharmacy to be a part of this patient's care.  Doreene Eland, PharmD, BCPS.   Work Cell: [phone] 06/21/2019 11:07 AM

## 2019-06-21 NOTE — Progress Notes (Signed)
Occupational Therapy Evaluation Patient Details Name: Chelsea Keller MRN: [id number] DOB: 1935-03-19 Today's Date: 06/21/2019    History of Present Illness 84 yo female admitted with acute respiratory failure. Found to have acute bil PEs and sepsis. Hx of morbid obesity,HF, DVT, PE.   Clinical Impression   PTA, pt was living at home with her adult children, pt reports she was independent with ADL her family assisted with IADL, pt reports she was modified independent with functional mobility with assistive device. Pt currently requires supervision to minguard for functional mobility at RW level during ADL completion. Pt completed grooming at sink level with supervision for safety. She demonstrates increased work of breath and dyspnea on exertion 3/4.  Due to decline in current level of function, pt would benefit from acute OT to address established goals to facilitate safe D/C to venue listed below. At this time, recommend HHOT follow-up. Will continue to follow acutely.     Follow Up Recommendations  Home health OT;Supervision - Intermittent    Equipment Recommendations  None recommended by OT    Recommendations for Other Services       Precautions / Restrictions Precautions Precautions: Fall Restrictions Weight Bearing Restrictions: No      Mobility Bed Mobility Overal bed mobility: Needs Assistance Bed Mobility: Supine to Sit;Sit to Supine     Supine to sit: Supervision Sit to supine: Supervision   General bed mobility comments: supervision for safety;increased time and effort, use of bed rails during mobility;pt reports she sleeps in lift chair at home  Transfers Overall transfer level: Needs assistance Equipment used: Rolling walker (2 wheeled) Transfers: Sit to/from Stand Sit to Stand: Supervision         General transfer comment: supervision for safety    Balance Overall balance assessment: Needs assistance Sitting-balance support: No upper extremity  supported;Feet supported Sitting balance-Leahy Scale: Good Sitting balance - Comments: able to bend over to don briefs   Standing balance support: Bilateral upper extremity supported Standing balance-Leahy Scale: Poor Standing balance comment: reliant on BUE support on external surface, leaning on upper extremities while grooming at sink level                           ADL either performed or assessed with clinical judgement   ADL Overall ADL's : Needs assistance/impaired Eating/Feeding: Set up;Sitting   Grooming: Standing;Supervision/safety Grooming Details (indicate cue type and reason): completed at sink level Upper Body Bathing: Sitting;Supervision/ safety   Lower Body Bathing: Sit to/from stand;Supervison/ safety   Upper Body Dressing : Sitting;Supervision/safety   Lower Body Dressing: Minimal assistance;Sit to/from stand Lower Body Dressing Details (indicate cue type and reason): minA to access feet, pt reports she doesn't wear socks at home;able to don briefs sitting EOB Toilet Transfer: Ambulation;RW;Supervision/safety Toilet Transfer Details (indicate cue type and reason): simulated in room Toileting- Clothing Manipulation and Hygiene: Sit to/from stand;Supervision/safety       Functional mobility during ADLs: Min guard;Rolling walker General ADL Comments: pt with increased dyspnea on exertion 3/4;decreased activity tolerance, would benefit from energy conservation strategies;began discussion on energy conservation strategies to implement during ADL     Vision Patient Visual Report: No change from baseline Vision Assessment?: No apparent visual deficits     Perception     Praxis      Pertinent Vitals/Pain Pain Assessment: No/denies pain     Hand Dominance Right   Extremity/Trunk Assessment Upper Extremity Assessment Upper Extremity Assessment: Generalized weakness(limited shoulder ROM  due to arthritis)   Lower Extremity Assessment Lower  Extremity Assessment: Defer to PT evaluation   Cervical / Trunk Assessment Cervical / Trunk Assessment: Normal   Communication Communication Communication: No difficulties   Cognition Arousal/Alertness: Awake/alert Behavior During Therapy: WFL for tasks assessed/performed Overall Cognitive Status: Within Functional Limits for tasks assessed                                     General Comments  SpO2 94%-96% RA throughout session;DoE 3/4;HR 95-101bpm during mobility     Exercises     Shoulder Instructions      Home Living Family/patient expects to be discharged to:: Private residence Living Arrangements: Children;Other relatives Available Help at Discharge: Family;Available PRN/intermittently Type of Home: House Home Access: Level entry     Home Layout: Two level Alternate Level Stairs-Number of Steps: has stair lift   Bathroom Shower/Tub: Teacher, early years/pre: Standard     Home Equipment: Bedside commode;Tub bench;Walker - 4 wheels;Cane - single point;Grab bars - tub/shower;Shower seat;Other (comment);Electric scooter   Additional Comments: stair lift      Prior Functioning/Environment Level of Independence: Independent with assistive device(s)        Comments: rollator for household ambulation, independent with ADL;family assists with IADL        OT Problem List: Decreased activity tolerance;Impaired balance (sitting and/or standing);Cardiopulmonary status limiting activity;Decreased knowledge of precautions;Obesity      OT Treatment/Interventions: Self-care/ADL training;Therapeutic exercise;Energy conservation;DME and/or AE instruction;Therapeutic activities;Patient/family education;Balance training    OT Goals(Current goals can be found in the care plan section) Acute Rehab OT Goals Patient Stated Goal: to continue living at home OT Goal Formulation: With patient Time For Goal Achievement: 07/05/19 Potential to Achieve Goals:  Good ADL Goals Pt Will Perform Grooming: with modified independence;standing;sitting Pt Will Perform Lower Body Dressing: with modified independence;sit to/from stand Pt Will Transfer to Toilet: with modified independence;ambulating Additional ADL Goal #1: Pt will verbalize and demonstrate independence with 3 energy conservation strategies during ADL completion.  OT Frequency: Min 2X/week   Barriers to D/C: Decreased caregiver support          Co-evaluation              AM-PAC OT "6 Clicks" Daily Activity     Outcome Measure Help from another person eating meals?: A Little Help from another person taking care of personal grooming?: A Little Help from another person toileting, which includes using toliet, bedpan, or urinal?: A Little Help from another person bathing (including washing, rinsing, drying)?: A Little Help from another person to put on and taking off regular upper body clothing?: A Little Help from another person to put on and taking off regular lower body clothing?: A Little 6 Click Score: 18   End of Session Equipment Utilized During Treatment: Gait belt Nurse Communication: Mobility status  Activity Tolerance: Patient tolerated treatment well Patient left: in bed;with call bell/phone within reach;with bed alarm set  OT Visit Diagnosis: Unsteadiness on feet (R26.81);Other abnormalities of gait and mobility (R26.89);Muscle weakness (generalized) (M62.81)                Time: [id number] OT Time Calculation (min): 32 min Charges:  OT General Charges $OT Visit: 1 Visit OT Evaluation $OT Eval Moderate Complexity: 1 Mod OT Treatments $Self Care/Home Management : 8-22 mins  Dorinda Hill OTR/L Acute Rehabilitation Services Office: [place]  [place] Silos 06/21/2019, 2:26 PM

## 2019-06-21 NOTE — Care Management Important Message (Signed)
Important Message  Patient Details IM Letter given to Dessa Phi RN Case Manager to present to the Patient Name: Chelsea Keller MRN: [id number] Date of Birth: [birth year]   Medicare Important Message Given:  Yes     [name] 06/21/2019, 11:30 AM

## 2019-06-21 NOTE — Discharge Instructions (Signed)
Information on my medicine - ELIQUIS (apixaban)  This medication education was reviewed with me or my healthcare representative as part of my discharge preparation.    Why was Eliquis prescribed for you? Eliquis was prescribed to treat blood clots that may have been found in the veins of your legs (deep vein thrombosis) or in your lungs (pulmonary embolism) and to reduce the risk of them occurring again.  What do You need to know about Eliquis ? The starting dose is 10 mg (two 5 mg tablets) taken TWICE daily for the FIRST SEVEN (7) DAYS, then on 06/28/19  the dose is reduced to ONE 5 mg tablet taken TWICE daily.  Eliquis may be taken with or without food.   Try to take the dose about the same time in the morning and in the evening. If you have difficulty swallowing the tablet whole please discuss with your pharmacist how to take the medication safely.  Take Eliquis exactly as prescribed and DO NOT stop taking Eliquis without talking to the doctor who prescribed the medication.  Stopping may increase your risk of developing a new blood clot.  Refill your prescription before you run out.  After discharge, you should have regular check-up appointments with your healthcare provider that is prescribing your Eliquis.    What do you do if you miss a dose? If a dose of ELIQUIS is not taken at the scheduled time, take it as soon as possible on the same day and twice-daily administration should be resumed. The dose should not be doubled to make up for a missed dose.  Important Safety Information A possible side effect of Eliquis is bleeding. You should call your healthcare provider right away if you experience any of the following: ? Bleeding from an injury or your nose that does not stop. ? Unusual colored urine (red or dark brown) or unusual colored stools (red or black). ? Unusual bruising for unknown reasons. ? A serious fall or if you hit your head (even if there is no bleeding).  Some  medicines may interact with Eliquis and might increase your risk of bleeding or clotting while on Eliquis. To help avoid this, consult your healthcare provider or pharmacist prior to using any new prescription or non-prescription medications, including herbals, vitamins, non-steroidal anti-inflammatory drugs (NSAIDs) and supplements.  This website has more information on Eliquis (apixaban): http://www.eliquis.com/eliquis/home

## 2019-06-21 NOTE — Progress Notes (Signed)
Peripherally Inserted Central Catheter/Midline Placement  The IV Nurse has discussed with the patient and/or persons authorized to consent for the patient, the purpose of this procedure and the potential benefits and risks involved with this procedure.  The benefits include less needle sticks, lab draws from the catheter, and the patient may be discharged home with the catheter. Risks include, but not limited to, infection, bleeding, blood clot (thrombus formation), and puncture of an artery; nerve damage and irregular heartbeat and possibility to perform a PICC exchange if needed/ordered by physician.  Alternatives to this procedure were also discussed.  Bard Power PICC patient education guide, fact sheet on infection prevention and patient information card has been provided to patient /or left at bedside.    PICC/Midline Placement Documentation  PICC Single Lumen 06/21/19 PICC Right Cephalic 42 cm 0 cm (Active)  Indication for Insertion or Continuance of Line Home intravenous therapies (PICC only) 06/21/19 1638  Exposed Catheter (cm) 0 cm 06/21/19 1638  Site Assessment Clean;Dry;Intact 06/21/19 1638  Line Status Flushed;Saline locked;Blood return noted 06/21/19 1638  Dressing Type Transparent 06/21/19 1638  Dressing Status Clean;Dry;Intact;Antimicrobial disc in place 06/21/19 1638  Dressing Change Due 06/28/19 06/21/19 1638       Gordan Payment 06/21/2019, 4:39 PM

## 2019-06-21 NOTE — TOC Transition Note (Signed)
Transition of Care [facility]) - CM/SW Discharge Note   Patient Details  Name: Chelsea Keller MRN: [id number] Date of Birth: 06/10/1935  [facility] CM/SW Contact:  Dessa Phi, RN Phone Number: 06/21/2019, 3:12 PM   Clinical Narrative: [facility]sid[facility] Evert rep Pam-iv abx initial instruction/[facility] rep Danie Chandler abx ongoing instruction.HHPT [place].     Final next level of care: Home w Home Health Services Barriers to Discharge: No Barriers Identified   Patient Goals and CMS Choice        Discharge Placement                       Discharge Plan and Services                          HH Arranged: RN, PT HH Agency: [facility], [place] ([facility]) Date HH Agency Contacted: 06/21/19 Time [place]: 26 Representative spoke with at [place]: Pam/Karen  Social Determinants of Health ([place]) Interventions     Readmission Risk Interventions No flowsheet data found.

## 2019-06-22 ENCOUNTER — Telehealth: Payer: Self-pay | Admitting: Cardiovascular Disease

## 2019-06-22 ENCOUNTER — Telehealth: Payer: Self-pay

## 2019-06-22 DIAGNOSIS — E538 Deficiency of other specified B group vitamins: Secondary | ICD-10-CM | POA: Diagnosis not present

## 2019-06-22 DIAGNOSIS — F419 Anxiety disorder, unspecified: Secondary | ICD-10-CM | POA: Diagnosis not present

## 2019-06-22 DIAGNOSIS — I5032 Chronic diastolic (congestive) heart failure: Secondary | ICD-10-CM | POA: Diagnosis not present

## 2019-06-22 DIAGNOSIS — I2699 Other pulmonary embolism without acute cor pulmonale: Secondary | ICD-10-CM | POA: Diagnosis not present

## 2019-06-22 DIAGNOSIS — D631 Anemia in chronic kidney disease: Secondary | ICD-10-CM | POA: Diagnosis not present

## 2019-06-22 DIAGNOSIS — Z7901 Long term (current) use of anticoagulants: Secondary | ICD-10-CM | POA: Diagnosis not present

## 2019-06-22 DIAGNOSIS — Z87891 Personal history of nicotine dependence: Secondary | ICD-10-CM | POA: Diagnosis not present

## 2019-06-22 DIAGNOSIS — J9601 Acute respiratory failure with hypoxia: Secondary | ICD-10-CM | POA: Diagnosis not present

## 2019-06-22 DIAGNOSIS — I130 Hypertensive heart and chronic kidney disease with heart failure and stage 1 through stage 4 chronic kidney disease, or unspecified chronic kidney disease: Secondary | ICD-10-CM | POA: Diagnosis not present

## 2019-06-22 DIAGNOSIS — D509 Iron deficiency anemia, unspecified: Secondary | ICD-10-CM | POA: Diagnosis not present

## 2019-06-22 DIAGNOSIS — F329 Major depressive disorder, single episode, unspecified: Secondary | ICD-10-CM | POA: Diagnosis not present

## 2019-06-22 DIAGNOSIS — Z792 Long term (current) use of antibiotics: Secondary | ICD-10-CM | POA: Diagnosis not present

## 2019-06-22 DIAGNOSIS — I081 Rheumatic disorders of both mitral and tricuspid valves: Secondary | ICD-10-CM | POA: Diagnosis not present

## 2019-06-22 DIAGNOSIS — M160 Bilateral primary osteoarthritis of hip: Secondary | ICD-10-CM | POA: Diagnosis not present

## 2019-06-22 DIAGNOSIS — I872 Venous insufficiency (chronic) (peripheral): Secondary | ICD-10-CM | POA: Diagnosis not present

## 2019-06-22 DIAGNOSIS — M5136 Other intervertebral disc degeneration, lumbar region: Secondary | ICD-10-CM | POA: Diagnosis not present

## 2019-06-22 DIAGNOSIS — L03115 Cellulitis of right lower limb: Secondary | ICD-10-CM | POA: Diagnosis not present

## 2019-06-22 DIAGNOSIS — N183 Chronic kidney disease, stage 3 unspecified: Secondary | ICD-10-CM | POA: Diagnosis not present

## 2019-06-22 DIAGNOSIS — I700 Atherosclerosis of aorta: Secondary | ICD-10-CM | POA: Diagnosis not present

## 2019-06-22 DIAGNOSIS — A408 Other streptococcal sepsis: Secondary | ICD-10-CM | POA: Diagnosis not present

## 2019-06-22 DIAGNOSIS — I0981 Rheumatic heart failure: Secondary | ICD-10-CM | POA: Diagnosis not present

## 2019-06-22 DIAGNOSIS — M797 Fibromyalgia: Secondary | ICD-10-CM | POA: Diagnosis not present

## 2019-06-22 DIAGNOSIS — Z452 Encounter for adjustment and management of vascular access device: Secondary | ICD-10-CM | POA: Diagnosis not present

## 2019-06-22 DIAGNOSIS — Z5181 Encounter for therapeutic drug level monitoring: Secondary | ICD-10-CM | POA: Diagnosis not present

## 2019-06-22 NOTE — Telephone Encounter (Signed)
noted 

## 2019-06-22 NOTE — Telephone Encounter (Signed)
It looks like she was admitted for non cardiac isues (pulmonary embolism and sepsis from bacteremia/leg wound). I would be glad to see her if needed before February but if no new cardiac issues, I am not sure that is necessary. Gerald Stabs

## 2019-06-22 NOTE — Telephone Encounter (Signed)
Transition Care Management Follow-up Telephone Call  Date of discharge and from where: 06/21/2019, [facility]  How have you been since you were released from the hospital? Patient states that she feels good but is still very weak. No other symptoms noted.  Any questions or concerns? No   Items Reviewed:  Did the pt receive and understand the discharge instructions provided? Yes   Medications obtained and verified? Yes   Any new allergies since your discharge? No   Dietary orders reviewed? Yes  Do you have support at home? Yes   Functional Questionnaire: (I = Independent and D = Dependent) ADLs: I  Bathing/Dressing- I  Meal Prep- I  Eating- I  Maintaining continence- I  Transferring/Ambulation- I  Managing Meds- I  Follow up appointments reviewed:   PCP Hospital f/u appt confirmed? Yes  Scheduled to see Dr. Silvio Pate on 07/04/2019 @ 8 am. Patient denies any COVID symptoms.  [facility] f/u appt confirmed? will follow up with cardiology as planned   Are transportation arrangements needed? No   If their condition worsens, is the pt aware to call PCP or go to the Emergency Dept.? Yes  Was the patient provided with contact information for the PCP's office or ED? Yes  Was to pt encouraged to call back with questions or concerns? Yes

## 2019-06-22 NOTE — Telephone Encounter (Signed)
New Message  Patient was admitted into the hospital on 06/17/19 for SOB and discharged 06/21/19. Patient's discharge instructions were to follow up with Dr. Angelena Form. Patient has an appointment scheduled for 08/14/2019 with Dr. Angelena Form. Patient's daughter would like to know if that appointment would be fine to keep or if the patient needs to be scheduled for something sooner. Please call patient's daughter Chelsea Keller to advise.

## 2019-06-22 NOTE — Telephone Encounter (Signed)
Spoke with patient's daughter, [name] and adv of recommendations from Dr. Angelena Form.  She is in agreement with this plan.  Will call back if any cardiac concerns.

## 2019-06-24 LAB — CULTURE, BLOOD (ROUTINE X 2)
Culture: NO GROWTH
Culture: NO GROWTH
Special Requests: ADEQUATE
Special Requests: ADEQUATE

## 2019-06-25 LAB — CULTURE, BLOOD (ROUTINE X 2)
Culture: NO GROWTH
Culture: NO GROWTH
Special Requests: ADEQUATE
Special Requests: ADEQUATE

## 2019-06-29 MED ORDER — APIXABAN 5 MG PO TABS: 5.0000 mg | ORAL_TABLET | Freq: Two times a day (BID) | ORAL | 0 refills | Status: DC

## 2019-07-04 ENCOUNTER — Encounter: Payer: Self-pay | Admitting: Internal Medicine

## 2019-07-04 ENCOUNTER — Encounter: Payer: Self-pay | Admitting: Hematology and Oncology

## 2019-07-04 ENCOUNTER — Other Ambulatory Visit: Payer: Self-pay

## 2019-07-04 ENCOUNTER — Ambulatory Visit (INDEPENDENT_AMBULATORY_CARE_PROVIDER_SITE_OTHER): Payer: Medicare Other | Admitting: Internal Medicine

## 2019-07-04 ENCOUNTER — Telehealth: Payer: Self-pay | Admitting: Hematology and Oncology

## 2019-07-04 DIAGNOSIS — I5032 Chronic diastolic (congestive) heart failure: Secondary | ICD-10-CM | POA: Diagnosis not present

## 2019-07-04 DIAGNOSIS — I2699 Other pulmonary embolism without acute cor pulmonale: Secondary | ICD-10-CM

## 2019-07-04 DIAGNOSIS — D649 Anemia, unspecified: Secondary | ICD-10-CM | POA: Diagnosis not present

## 2019-07-04 MED ORDER — APIXABAN 5 MG PO TABS: 5.0000 mg | ORAL_TABLET | Freq: Two times a day (BID) | ORAL | 3 refills | Status: DC

## 2019-07-04 NOTE — Assessment & Plan Note (Signed)
This is compensated with the daily furosemide

## 2019-07-04 NOTE — Telephone Encounter (Signed)
Rescheduled per MD request. Called and spoke with patient. Confirmed new appt date 1/8

## 2019-07-04 NOTE — Progress Notes (Signed)
Subjective:    Patient ID: Chelsea Keller, female    DOB: [birth year], 84 y.o.   MRN: [id number]  HPI Here for hospital follow up with daughter Reviewed discharge summary, all labs, chest CT, etc  This visit occurred during the SARS-CoV-2 public health emergency.  Safety protocols were in place, including screening questions prior to the visit, additional usage of staff PPE, and extensive cleaning of exam room while observing appropriate contact time as indicated for disinfecting solutions.   Had recurrence of SOB She thought she had low hemoglobin again--same lethargy and dyspnea Went to ER Found to have recurrent pulmonary emboli Placed back on eliquis Feeling some better now-but still "lethargic and SOB"  Also had a Strep sepsis Has PICC line--- on ceftriaxone IV daily---till the end of January Will follow up with ID Gets diarrhea after the antibiotic---- loose stools bid Unsure if skin source or possible endocarditis (though echo didn't show evidence of clear valve disease--and EF normal with some diastolic dysfunction)  Starting PT this week Nurse visits still --daughter does the infusions  Is independent with ADLs Daughter does help her clean up after the bathroom  Current Outpatient Medications on File Prior to Visit  Medication Sig Dispense Refill  . acetaminophen (TYLENOL) 500 MG tablet Take 1,000-1,500 mg by mouth every 6 (six) hours as needed for moderate pain or headache (pain).     Marland Kitchen apixaban (ELIQUIS) 5 MG TABS tablet Take 1 tablet (5 mg total) by mouth 2 (two) times daily. 14 tablet 0  . cefTRIAXone (ROCEPHIN) IVPB Inject 2 g into the vein daily. Indication: S. gordonii bacteremia and possible endocarditis Last Day of Therapy: 07/31/2019 Labs - Once weekly:  CBC/D and BMP, Labs - Every other week:  ESR and CRP 40 Units 0  . furosemide (LASIX) 80 MG tablet Take 80 mg by mouth daily.     Marland Kitchen gabapentin (NEURONTIN) 300 MG capsule Take 1 capsule in the morning and 2  capsules at bedtime 21 capsule 0  . potassium chloride SA (K-DUR,KLOR-CON) 20 MEQ tablet TAKE 2 TABLETS (40 MEQ TOTAL) DAILY (Patient taking differently: Take 40 mEq by mouth daily. ) 180 tablet 4  . silver sulfADIAZINE (SILVADENE) 1 % cream Apply 1 application topically daily. 50 g 0  . tiZANidine (ZANAFLEX) 2 MG tablet Take 1 tablet (2 mg total) by mouth 3 (three) times daily as needed for muscle spasms. 60 tablet 0  . traMADol (ULTRAM) 50 MG tablet Take 1 tablet (50 mg total) by mouth 3 (three) times daily as needed for moderate pain. 60 tablet 0  . triamterene-hydrochlorothiazide (MAXZIDE-25) 37.5-25 MG tablet Take 1 tablet by mouth daily. 90 tablet 3  . apixaban (ELIQUIS) 5 MG TABS tablet Take 2 tablets (10 mg total) by mouth 2 (two) times daily for 7 days. 28 tablet 0   No current facility-administered medications on file prior to visit.    Allergies  Allergen Reactions  . Codeine Sulfate Shortness Of Breath  . Celecoxib Other (See Comments)    Caused vaginal bleeding  . Erythromycin Base Nausea And Vomiting    Past Medical History:  Diagnosis Date  . Allergy   . Anemia   . Anxiety   . Arthritis   . Arthritis of sacroiliac joint of both sides 11/12/2017  . Bilateral pulmonary embolism ([place]) 06/23/2017  . Chronic diastolic CHF (congestive heart failure) (HCC)    a. Echo 1/16:  mild LVH, EF normal, grade 1 DD, MAC  . Chronic venous insufficiency  chronic LE edema  . DDD (degenerative disc disease), lumbar 11/12/2017  . Degenerative joint disease (DJD) of hip, Bilateral  11/12/2017  . Depression   . Fibromyalgia    constant pain  . Hx of cardiac catheterization    a. LHC in [place] "ok" per patient with mild plaque in a single vessel - records not available  . Hx of cardiovascular stress test    a. Nuclear study in 2008 normal  . Hx of colonic polyps   . Hypertension   . Hypertriglyceridemia   . Impaired fasting glucose   . PONV (postoperative nausea and vomiting)   . PUD  (peptic ulcer disease)    hx of gastric ulcer  . Pulmonary emboli ([place]) 06/2017  . Vitamin B12 deficiency     Past Surgical History:  Procedure Laterality Date  . ABDOMINAL HYSTERECTOMY    . CATARACT EXTRACTION  10/2003   OD  . CHOLECYSTECTOMY    . COLONOSCOPY W/ POLYPECTOMY    . COLONOSCOPY WITH PROPOFOL N/A 10/15/2014   Procedure: COLONOSCOPY WITH PROPOFOL;  Surgeon: Ladene Artist, MD;  Location: [facility] ENDOSCOPY;  Service: Endoscopy;  Laterality: N/A;  . COLONOSCOPY WITH PROPOFOL N/A 03/23/2018   Procedure: COLONOSCOPY WITH PROPOFOL;  Surgeon: Gatha Mayer, MD;  Location: [facility] ENDOSCOPY;  Service: Endoscopy;  Laterality: N/A;  . ESOPHAGOGASTRODUODENOSCOPY (EGD) WITH PROPOFOL N/A 10/15/2014   Procedure: ESOPHAGOGASTRODUODENOSCOPY (EGD) WITH PROPOFOL;  Surgeon: Ladene Artist, MD;  Location: [facility] ENDOSCOPY;  Service: Endoscopy;  Laterality: N/A;  . EXTERNAL FIXATION ANKLE FRACTURE     Fx.  left ankle-fixation with pins later removed sec to infection 1985  . EXTERNAL FIXATION WRIST FRACTURE  1985   left with pins  . EYE SURGERY    . FEMUR FRACTURE SURGERY  06/2009  . FRACTURE SURGERY    . HARDWARE REMOVAL Left 10/05/2013   Procedure: HARDWARE REMOVAL LEFT DISTAL FEMUR;  Surgeon: Rozanna Box, MD;  Location: [facility];  Service: Orthopedics;  Laterality: Left;  . HEMIARTHROPLASTY SHOULDER FRACTURE  06/2009  . HOT HEMOSTASIS N/A 03/23/2018   Procedure: HOT HEMOSTASIS (ARGON PLASMA COAGULATION/BICAP);  Surgeon: Gatha Mayer, MD;  Location: [facility] ENDOSCOPY;  Service: Endoscopy;  Laterality: N/A;  . JOINT REPLACEMENT    . STERIOD INJECTION Right 10/05/2013   Procedure: STEROID INJECTION;  Surgeon: Rozanna Box, MD;  Location: [place];  Service: Orthopedics;  Laterality: Right;  . TONSILLECTOMY    . TONSILLECTOMY    . TOTAL KNEE ARTHROPLASTY  03/09   left    Family History  Problem Relation Age of Onset  . Depression Mother   . Cancer Mother        uterine cancer  . Heart attack Father     . Cancer Brother        prostate cancer  . Diabetes Maternal Aunt   . Arthritis Brother   . Asthma Brother   . Stroke Maternal Grandmother   . Pulmonary embolism Daughter     Social History   Socioeconomic History  . Marital status: Widowed    Spouse name: Not on file  . Number of children: 4  . Years of education: Not on file  . Highest education level: Not on file  Occupational History  . Occupation: retired crossing guard  . 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 Smoking status: Former Smoker    Packs/day: 1.00    Years: 40.00    Pack years: 40.00  Types: Cigarettes    Quit date: 07/06/1993    Years since quitting: 26.0  . Smokeless tobacco: Never Used  Substance and Sexual Activity  . Alcohol use: No    Alcohol/week: 0.0 standard drinks  . Drug use: No  . Sexual activity: Not on file  Other Topics Concern  . 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 Difficulty of Paying Living Expenses: Not on file  Food Insecurity:   . Worried About Charity fundraiser in the Last Year: Not on file  . Ran Out of Food in the Last Year: Not on file  Transportation Needs:   . Lack of Transportation (Medical): Not on file  . Lack of Transportation (Non-Medical): Not on file  Physical Activity:   . Days of Exercise per Week: Not on file  . Minutes of Exercise per Session: Not on file  Stress:   . Feeling of Stress : Not on file  Social Connections:   . Frequency of Communication with Friends and Family: Not on file  . Frequency of Social Gatherings with Friends and Family: Not on file   . Attends Religious Services: Not on file  . Active Member of Clubs or Organizations: Not on file  . Attends Archivist Meetings: Not on file  . Marital Status: Not on file  Intimate Partner Violence:   . Fear of Current or Ex-Partner: Not on file  . Emotionally Abused: Not on file  . Physically Abused: Not on file  . Sexually Abused: Not on file   Review of Systems Eating but not much appetite  Weight up some---retaining water though Has taken lasix daily (49m)    Objective:   Physical Exam  Constitutional: No distress.  Neck: No thyromegaly present.  Cardiovascular: Normal rate, regular rhythm and normal heart sounds. Exam reveals no gallop.  No murmur heard. Respiratory: Effort normal. No respiratory distress. She has no wheezes. She has no rales.  Decreased breath sounds but clear  Musculoskeletal:     Comments: 1-2+ non pitting calf edema with stasis changes  Lymphadenopathy:    She has no cervical adenopathy.  Skin:  No calf ulcers Small hematoma under surface of skin right mid lateral calf  Psychiatric: She has a normal mood and affect. Her behavior is normal.           Assessment & Plan:

## 2019-07-04 NOTE — Assessment & Plan Note (Signed)
Recurrent now without provocation Will need long term anticoagulation despite chronic GI blood loss High dose for at least 3 months--will defer to Dr Lindi Adie the decision time to decrease to 2.5mg  bid

## 2019-07-04 NOTE — Assessment & Plan Note (Signed)
Chronic GI loss Seeing hematology---not a fan of the iron infusions

## 2019-07-05 ENCOUNTER — Other Ambulatory Visit: Payer: Self-pay

## 2019-07-05 ENCOUNTER — Other Ambulatory Visit: Payer: Medicare Other

## 2019-07-05 DIAGNOSIS — D500 Iron deficiency anemia secondary to blood loss (chronic): Secondary | ICD-10-CM

## 2019-07-05 NOTE — Progress Notes (Signed)
RN reviewed Chelsea Keller and telephone encounter with patient's daughter regarding low Hgb.  Pt's daughter reports that pt is symptomatic with increased shortness of breath,  and dizziness.  MD recommendations for patient to obtain labs with type and cross and set up transfusions based on Hgb.    Pt's daughter notified.  Labs will be obtained on 12/31.

## 2019-07-06 ENCOUNTER — Inpatient Hospital Stay: Payer: Medicare Other | Attending: Hematology and Oncology

## 2019-07-06 ENCOUNTER — Other Ambulatory Visit: Payer: Self-pay

## 2019-07-06 DIAGNOSIS — D500 Iron deficiency anemia secondary to blood loss (chronic): Secondary | ICD-10-CM

## 2019-07-06 LAB — CBC WITH DIFFERENTIAL (CANCER CENTER ONLY)
Abs Immature Granulocytes: 0.06 10*3/uL (ref 0.00–0.07)
Basophils Absolute: 0.1 10*3/uL (ref 0.0–0.1)
Basophils Relative: 1 %
Eosinophils Absolute: 0.7 10*3/uL — ABNORMAL HIGH (ref 0.0–0.5)
Eosinophils Relative: 7 %
HCT: 26 % — ABNORMAL LOW (ref 36.0–46.0)
Hemoglobin: 7.8 g/dL — ABNORMAL LOW (ref 12.0–15.0)
Immature Granulocytes: 1 %
Lymphocytes Relative: 13 %
Lymphs Abs: 1.3 10*3/uL (ref 0.7–4.0)
MCH: 25.2 pg — ABNORMAL LOW (ref 26.0–34.0)
MCHC: 30 g/dL (ref 30.0–36.0)
MCV: 84.1 fL (ref 80.0–100.0)
Monocytes Absolute: 0.9 10*3/uL (ref 0.1–1.0)
Monocytes Relative: 9 %
Neutro Abs: 6.9 10*3/uL (ref 1.7–7.7)
Neutrophils Relative %: 69 %
Platelet Count: 436 10*3/uL — ABNORMAL HIGH (ref 150–400)
RBC: 3.09 MIL/uL — ABNORMAL LOW (ref 3.87–5.11)
RDW: 17.4 % — ABNORMAL HIGH (ref 11.5–15.5)
WBC Count: 9.9 10*3/uL (ref 4.0–10.5)
nRBC: 0 % (ref 0.0–0.2)

## 2019-07-06 LAB — ABO/RH: ABO/RH(D): B POS

## 2019-07-06 LAB — PREPARE RBC (CROSSMATCH)

## 2019-07-06 LAB — SAMPLE TO BLOOD BANK

## 2019-07-06 NOTE — Progress Notes (Signed)
RN reviewed lab results with MD, recommendations for 1 unit of PRBC's.   Pt is scheduled for 1/2 @ 8am.  RN encouraged patient to keep blue blood bracelet on.  RN confirmed with blood bank orders received.

## 2019-07-08 ENCOUNTER — Other Ambulatory Visit: Payer: Self-pay

## 2019-07-08 ENCOUNTER — Inpatient Hospital Stay: Payer: Medicare Other | Attending: Hematology and Oncology

## 2019-07-08 DIAGNOSIS — Z86711 Personal history of pulmonary embolism: Secondary | ICD-10-CM | POA: Insufficient documentation

## 2019-07-08 DIAGNOSIS — Z7901 Long term (current) use of anticoagulants: Secondary | ICD-10-CM | POA: Insufficient documentation

## 2019-07-08 DIAGNOSIS — K648 Other hemorrhoids: Secondary | ICD-10-CM | POA: Diagnosis not present

## 2019-07-08 DIAGNOSIS — Z79899 Other long term (current) drug therapy: Secondary | ICD-10-CM | POA: Diagnosis not present

## 2019-07-08 DIAGNOSIS — K922 Gastrointestinal hemorrhage, unspecified: Secondary | ICD-10-CM | POA: Diagnosis not present

## 2019-07-08 DIAGNOSIS — L03115 Cellulitis of right lower limb: Secondary | ICD-10-CM | POA: Diagnosis not present

## 2019-07-08 DIAGNOSIS — K5520 Angiodysplasia of colon without hemorrhage: Secondary | ICD-10-CM | POA: Insufficient documentation

## 2019-07-08 DIAGNOSIS — D500 Iron deficiency anemia secondary to blood loss (chronic): Secondary | ICD-10-CM | POA: Diagnosis not present

## 2019-07-08 MED ORDER — DIPHENHYDRAMINE HCL 25 MG PO CAPS: 25.0000 mg | ORAL_CAPSULE | Freq: Once | ORAL | Status: DC

## 2019-07-08 MED ORDER — ACETAMINOPHEN 325 MG PO TABS: 650.0000 mg | ORAL_TABLET | Freq: Once | ORAL | Status: DC

## 2019-07-08 MED ORDER — SODIUM CHLORIDE 0.9% FLUSH
5.0000 mL | Freq: Once | INTRAVENOUS | Status: AC
  Administered 2019-07-08: 5 mL via INTRAVENOUS
  Filled 2019-07-08: qty 10

## 2019-07-08 MED ORDER — SODIUM CHLORIDE 0.9% IV SOLUTION
250.0000 mL | Freq: Once | INTRAVENOUS | Status: AC
  Administered 2019-07-08: 250 mL via INTRAVENOUS
  Filled 2019-07-08: qty 250

## 2019-07-08 MED ORDER — HEPARIN SOD (PORK) LOCK FLUSH 100 UNIT/ML IV SOLN
500.0000 [IU] | Freq: Once | INTRAVENOUS | Status: AC
  Administered 2019-07-08: 500 [IU] via INTRAVENOUS
  Filled 2019-07-08: qty 5

## 2019-07-10 LAB — BPAM RBC
Blood Product Expiration Date: 202101192359
ISSUE DATE / TIME: 202101020844
Unit Type and Rh: 7300

## 2019-07-10 LAB — TYPE AND SCREEN
ABO/RH(D): B POS
Antibody Screen: NEGATIVE
Unit division: 0

## 2019-07-11 ENCOUNTER — Inpatient Hospital Stay: Payer: Medicare Other

## 2019-07-11 ENCOUNTER — Other Ambulatory Visit: Payer: Self-pay

## 2019-07-11 DIAGNOSIS — K5520 Angiodysplasia of colon without hemorrhage: Secondary | ICD-10-CM | POA: Diagnosis not present

## 2019-07-11 DIAGNOSIS — Z7901 Long term (current) use of anticoagulants: Secondary | ICD-10-CM | POA: Diagnosis not present

## 2019-07-11 DIAGNOSIS — D500 Iron deficiency anemia secondary to blood loss (chronic): Secondary | ICD-10-CM

## 2019-07-11 DIAGNOSIS — Z79899 Other long term (current) drug therapy: Secondary | ICD-10-CM | POA: Diagnosis not present

## 2019-07-11 DIAGNOSIS — L03115 Cellulitis of right lower limb: Secondary | ICD-10-CM | POA: Diagnosis not present

## 2019-07-11 DIAGNOSIS — K922 Gastrointestinal hemorrhage, unspecified: Secondary | ICD-10-CM | POA: Diagnosis not present

## 2019-07-11 LAB — IRON AND TIBC
Iron: 36 ug/dL — ABNORMAL LOW (ref 41–142)
Saturation Ratios: 9 % — ABNORMAL LOW (ref 21–57)
TIBC: 378 ug/dL (ref 236–444)
UIBC: 342 ug/dL (ref 120–384)

## 2019-07-11 LAB — CBC WITH DIFFERENTIAL (CANCER CENTER ONLY)
Abs Immature Granulocytes: 0.08 10*3/uL — ABNORMAL HIGH (ref 0.00–0.07)
Basophils Absolute: 0.1 10*3/uL (ref 0.0–0.1)
Basophils Relative: 1 %
Eosinophils Absolute: 0.9 10*3/uL — ABNORMAL HIGH (ref 0.0–0.5)
Eosinophils Relative: 9 %
HCT: 29.3 % — ABNORMAL LOW (ref 36.0–46.0)
Hemoglobin: 8.7 g/dL — ABNORMAL LOW (ref 12.0–15.0)
Immature Granulocytes: 1 %
Lymphocytes Relative: 10 %
Lymphs Abs: 1 10*3/uL (ref 0.7–4.0)
MCH: 25.2 pg — ABNORMAL LOW (ref 26.0–34.0)
MCHC: 29.7 g/dL — ABNORMAL LOW (ref 30.0–36.0)
MCV: 84.9 fL (ref 80.0–100.0)
Monocytes Absolute: 0.8 10*3/uL (ref 0.1–1.0)
Monocytes Relative: 7 %
Neutro Abs: 7.6 10*3/uL (ref 1.7–7.7)
Neutrophils Relative %: 72 %
Platelet Count: 370 10*3/uL (ref 150–400)
RBC: 3.45 MIL/uL — ABNORMAL LOW (ref 3.87–5.11)
RDW: 17.4 % — ABNORMAL HIGH (ref 11.5–15.5)
WBC Count: 10.5 10*3/uL (ref 4.0–10.5)
nRBC: 0 % (ref 0.0–0.2)

## 2019-07-11 LAB — SAMPLE TO BLOOD BANK

## 2019-07-11 LAB — FERRITIN: Ferritin: 12 ng/mL (ref 11–307)

## 2019-07-13 ENCOUNTER — Ambulatory Visit: Payer: Medicare Other | Admitting: Hematology and Oncology

## 2019-07-13 NOTE — Telephone Encounter (Signed)
Spoke with Dr Baxter Flattery, patient and her daughter Chelsea Keller.  Chelsea Keller feels like this is an allergic reaction.  She says the benadryl helps some.  Per Dr Baxter Flattery, patient will stop IV ceftriaxone.  She will take the benadryl as directed and can take zyrtec and pepcid per instructions on the bottles if she needs additional symptom control. If she has difficulty breathing or swallowing, she will call 911.  Her daughter is comfortable with this as she is a first responder. RN notified Coretta at [facility] of the changes. Coretta will reach out to [facility] 1/8 to see how she is doing.    Dr Baxter Flattery will follow up 1/8 to see how Chelsea Keller is doing and to discuss further treatment.  Chelsea Keller will be available all day friday at her numbers and at Chelsea Keller's cell: [phone].  Landis Gandy, RN

## 2019-07-13 NOTE — Progress Notes (Signed)
  HEMATOLOGY-ONCOLOGY TELEPHONE VISIT PROGRESS NOTE  I connected with [name] on 07/14/2019 at  9:30 AM EST by telephone and verified that I am speaking with the correct person using two identifiers.  I discussed the limitations, risks, security and privacy concerns of performing an evaluation and management service by telephone and the availability of in person appointments.  I also discussed with the patient that there may be a patient responsible charge related to this service. The patient expressed understanding and agreed to proceed.   History of Present Illness: Chelsea Keller is a 85 y.o. female with above-mentioned history of iron deficiency anemiapreviously treated with IV iron. She was admitted from 06/18/19-06/21/19 for bilateral pulmonary emboli and was started on Lovenox. She received one unit of PRBCs on 07/08/19 after labs on 07/06/19 showed Hg 7.8. Labs on 07/11/19 showed: Hg 8.7, HCT 29.3, iron saturation 9%, ferritin 12. She presents over the phone today to review her labs.  Observations/Objective:     Assessment Plan:  Recurrent pulmonary emboli (HCC) Initial diagnosis of pulmonary embolism: 2018 treated with Eliquis discontinued March 2020 given the fact that her CT chest was negative and she has a history of GI bleed and iron deficiency anemia  Recurrent PE: 06/17/2019: Patient presented with respiratory failure due to PE and sepsis from Streptococcus gordonii (right lower extremity infection and cellulitis): Currently on Eliquis Recommendation: Lifelong anticoagulation given the recurrence of PE  Iron deficiency anemia due to chronic blood loss Hospitalization 08/11/2018: Severe GI bleed hemoglobin 4.9(internal hemorrhoids and angiodysplasia of the colon)Received blood transfusions IV iron treatment: September 2019, March 2020, Injectafer use June 2020   Toxicities of iron infusion:Patient gets profound diarrhea which last for 2 to 3 days after Feraheme (and prescription  for Lomotil for 3 days). With [facility], she did much better  Lab review: Hospitalization December 2020 for recurrent PE and Streptococcus infection (On Antibiotics through PICC line: Dr.Sneider with ID managing) Itchy, Headache and diarrhea due to antibiotics  07/06/2019: Hemoglobin 8.7 (blood transfusion given) Iron studies: Ferritin 10, Iron sat: 9% Plan: Venofer weekly x4 starting 07/18/2019 Patient's daughter is a patient of mine who is here for injection appointment on Tuesday 1/12.  She would like to bring her mother at the same time.  After that she would like to get afternoon appointments once a week  I discussed the assessment and treatment plan with the patient. The patient was provided an opportunity to ask questions and all were answered. The patient agreed with the plan and demonstrated an understanding of the instructions. The patient was advised to call back or seek an in-person evaluation if the symptoms worsen or if the condition fails to improve as anticipated.   I provided 15 minutes of non-face-to-face time during this encounter.   Rulon Eisenmenger, MD 07/14/2019    I, Molly Dorshimer, am acting as scribe for Nicholas Lose, MD.  I have reviewed the above documentation for accuracy and completeness, and I agree with the above.

## 2019-07-14 ENCOUNTER — Telehealth: Payer: Self-pay | Admitting: Hematology and Oncology

## 2019-07-14 ENCOUNTER — Other Ambulatory Visit: Payer: Self-pay | Admitting: Internal Medicine

## 2019-07-14 ENCOUNTER — Inpatient Hospital Stay (HOSPITAL_BASED_OUTPATIENT_CLINIC_OR_DEPARTMENT_OTHER): Payer: Medicare Other | Admitting: Hematology and Oncology

## 2019-07-14 DIAGNOSIS — I2699 Other pulmonary embolism without acute cor pulmonale: Secondary | ICD-10-CM | POA: Diagnosis not present

## 2019-07-14 DIAGNOSIS — D500 Iron deficiency anemia secondary to blood loss (chronic): Secondary | ICD-10-CM | POA: Diagnosis not present

## 2019-07-14 MED ORDER — HYDROXYZINE HCL 10 MG PO TABS: 10.0000 mg | ORAL_TABLET | Freq: Three times a day (TID) | ORAL | 0 refills | Status: DC | PRN

## 2019-07-14 MED ORDER — PREDNISONE 20 MG PO TABS: 20.0000 mg | ORAL_TABLET | Freq: Every day | ORAL | 0 refills | Status: DC

## 2019-07-14 NOTE — Progress Notes (Signed)
85yo F being treated for invasive group b streptococcal infection. She has felt that itching has persisted since having abtx. Has been taking benadryl without relief. Labs show mild eosinophilia of 900 TEC.  Will give atarax 10mg  tid prn plus short course of steroids of pred 20mg ,x 7d. Have asked her to stop taking benadryl  Will check back in 2 days to see if improved and have abtx change  Likely has rash/pruritis to cephalosporins

## 2019-07-14 NOTE — Telephone Encounter (Signed)
I could not reach patient regarding schedule no voicemail setup 

## 2019-07-14 NOTE — Assessment & Plan Note (Signed)
Initial diagnosis of pulmonary embolism: 2018 treated with Eliquis discontinued March 2020 given the fact that her CT chest was negative and she has a history of GI bleed and iron deficiency anemia  Recurrent PE: 06/17/2019: Patient presented with respiratory failure due to PE and sepsis from Streptococcus gordonii (right lower extremity infection and cellulitis): Currently on Eliquis Recommendation: Lifelong anticoagulation given the recurrence of PE

## 2019-07-14 NOTE — Assessment & Plan Note (Signed)
Hospitalization 08/11/2018: Severe GI bleed hemoglobin 4.9(internal hemorrhoids and angiodysplasia of the colon)Received blood transfusions IV iron treatment: September 2019, March 2020, Injectafer use June 2020  Toxicities of iron infusion:Patient gets profound diarrhea which last for 2 to 3 days after Feraheme (and prescription for Lomotil for 3 days). With [facility], she did much better  Lab review: Hospitalization December 2020 for recurrent PE and Streptococcus infection  07/06/2019: Hemoglobin 7.8 (blood transfusion given) 07/14/2019:

## 2019-07-17 ENCOUNTER — Other Ambulatory Visit: Payer: Self-pay | Admitting: Internal Medicine

## 2019-07-17 ENCOUNTER — Telehealth: Payer: Self-pay

## 2019-07-17 ENCOUNTER — Telehealth: Payer: Self-pay | Admitting: *Deleted

## 2019-07-17 MED ORDER — PREDNISONE 20 MG PO TABS: 20.0000 mg | ORAL_TABLET | Freq: Every day | ORAL | 0 refills | Status: AC

## 2019-07-17 NOTE — Telephone Encounter (Signed)
[facility] is calling to see if patient should be taking any antibiotics since discontinuing her IV Ceftriaxone on Thursday due to possible allergic reaction.  What was the plan?  [phone]  Please advise.   Laverle Patter, RN

## 2019-07-17 NOTE — Telephone Encounter (Signed)
Can we find out how much it would cost to do one dose of dalbavancin 1500mg  iv vs  linezolid 600mg  PO BID x 14 d.

## 2019-07-17 NOTE — Telephone Encounter (Signed)
PA for hydroxyzine was approved 06/27/19 - 07/16/20. RN notified pharmacy. Landis Gandy, RN

## 2019-07-17 NOTE — Telephone Encounter (Signed)
I asked [facility] to see how much dalbavancin is and they should tell us tomorrow. If it is cheaper than linezolid, I prefer the dalba. Then they can pull picc line once they have administered dalba. If linezolid is cheaper than we go that route.  I refilled her pred x 16more days   Thanks for your help.

## 2019-07-17 NOTE — Telephone Encounter (Signed)
Will send to Christie/Betty for their input. Should patient's PICC be pulled? She asked [facility] for it to be pulled. Landis Gandy, RN

## 2019-07-18 ENCOUNTER — Telehealth: Payer: Self-pay

## 2019-07-18 ENCOUNTER — Other Ambulatory Visit: Payer: Self-pay | Admitting: Internal Medicine

## 2019-07-18 ENCOUNTER — Inpatient Hospital Stay: Payer: Medicare Other

## 2019-07-18 ENCOUNTER — Other Ambulatory Visit: Payer: Self-pay

## 2019-07-18 VITALS — BP 118/55 | HR 70 | Temp 98.0°F | Resp 18

## 2019-07-18 DIAGNOSIS — L03115 Cellulitis of right lower limb: Secondary | ICD-10-CM | POA: Diagnosis not present

## 2019-07-18 DIAGNOSIS — K922 Gastrointestinal hemorrhage, unspecified: Secondary | ICD-10-CM | POA: Diagnosis not present

## 2019-07-18 DIAGNOSIS — Z79899 Other long term (current) drug therapy: Secondary | ICD-10-CM | POA: Diagnosis not present

## 2019-07-18 DIAGNOSIS — D500 Iron deficiency anemia secondary to blood loss (chronic): Secondary | ICD-10-CM

## 2019-07-18 DIAGNOSIS — K5520 Angiodysplasia of colon without hemorrhage: Secondary | ICD-10-CM | POA: Diagnosis not present

## 2019-07-18 DIAGNOSIS — Z7901 Long term (current) use of anticoagulants: Secondary | ICD-10-CM | POA: Diagnosis not present

## 2019-07-18 MED ORDER — LINEZOLID 600 MG PO TABS: 600.0000 mg | ORAL_TABLET | Freq: Two times a day (BID) | ORAL | 0 refills | Status: DC

## 2019-07-18 MED ORDER — SODIUM CHLORIDE 0.9% FLUSH
10.0000 mL | Freq: Once | INTRAVENOUS | Status: AC | PRN
  Administered 2019-07-18: 10 mL
  Filled 2019-07-18: qty 10

## 2019-07-18 MED ORDER — SODIUM CHLORIDE 0.9 % IV SOLN
Freq: Once | INTRAVENOUS | Status: AC
  Filled 2019-07-18: qty 250

## 2019-07-18 MED ORDER — ACETAMINOPHEN 325 MG PO TABS: 650.0000 mg | ORAL_TABLET | Freq: Once | ORAL | Status: DC

## 2019-07-18 MED ORDER — SODIUM CHLORIDE 0.9 % IV SOLN
200.0000 mg | Freq: Once | INTRAVENOUS | Status: AC
  Administered 2019-07-18: 200 mg via INTRAVENOUS
  Filled 2019-07-18: qty 200

## 2019-07-18 MED ORDER — HEPARIN SOD (PORK) LOCK FLUSH 100 UNIT/ML IV SOLN
250.0000 [IU] | Freq: Once | INTRAVENOUS | Status: AC | PRN
  Administered 2019-07-18: 250 [IU]
  Filled 2019-07-18: qty 5

## 2019-07-18 NOTE — Patient Instructions (Signed)

## 2019-07-18 NOTE — Telephone Encounter (Addendum)
Mary from [facility] called to make Dr. Baxter Flattery aware of copay for dalbavancin 1500mg  one time dose. Per [facility]ney copay would be 57.50/day.  Routing to Dr. Baxter Flattery to make aware. Mary also sent page to Dr. Baxter Flattery. Chelsea Keller

## 2019-07-18 NOTE — Telephone Encounter (Signed)
One time 1500 mg loading dose would need to be  $57.50/dose ($1500 without insurance), would need to be ordered.  It would be a first dose, would need [facility] to go to the home and administer.  Would arrive at [facility] tomorrow if ordered this afternoon. Would also need additional dose orders for 500 mg dosing if going to proceed. Please advise. Landis Gandy, RN

## 2019-07-18 NOTE — Telephone Encounter (Signed)
Per Dr Baxter Flattery, RN relayed verbal order to [place] at [facility] to pull PICC.  Patient will be switched to oral zyvox for 2 weeks.   Order repeated and verified. RN notified patient and her daughter Cyndi.  They verbalized understanding. Landis Gandy, RN

## 2019-07-18 NOTE — Telephone Encounter (Signed)
A quick test run of her insurance for 30-days of Zyvox was $10.00 copay. Once we have more specifics, I can do another test of her insurance.

## 2019-07-20 ENCOUNTER — Other Ambulatory Visit: Payer: Self-pay | Admitting: Hematology and Oncology

## 2019-07-20 ENCOUNTER — Telehealth: Payer: Self-pay | Admitting: *Deleted

## 2019-07-20 ENCOUNTER — Encounter: Payer: Self-pay | Admitting: Hematology and Oncology

## 2019-07-20 MED ORDER — DIPHENOXYLATE-ATROPINE 2.5-0.025 MG PO TABS: 1.0000 | ORAL_TABLET | Freq: Four times a day (QID) | ORAL | 0 refills | Status: DC | PRN

## 2019-07-20 NOTE — Telephone Encounter (Signed)
Received call from pt daughter [name] regarding pt experiencing profound diarrhea post IV Venofer unrelieved by OTC medication.  States pt always experiences profound diarrhea post IV iron infusions.  Denies dark or tarry stool.  RN will review with MD for further recommendations.

## 2019-07-22 DIAGNOSIS — Z452 Encounter for adjustment and management of vascular access device: Secondary | ICD-10-CM | POA: Diagnosis not present

## 2019-07-23 ENCOUNTER — Encounter: Payer: Self-pay | Admitting: Hematology and Oncology

## 2019-07-24 ENCOUNTER — Telehealth: Payer: Self-pay | Admitting: *Deleted

## 2019-07-24 NOTE — Telephone Encounter (Signed)
RN reached out to [facility] for follow up.  She does not take tramadol any longer.  Of note, she stopped her linezolid over the weekend due to diarrhea. She has not had issues tolerating fluids (pedialyte and water), but had episodes of diarrhea within minutes of eating anything. She just started lomotil 1/17, has not had another episode of diarrhea, but did not restart linezolid. She also gets diarrhea from her iron infusions, is waiting on Dr Geralyn Flash office for direction on this scheduled infusion this week. Landis Gandy, RN

## 2019-07-24 NOTE — Telephone Encounter (Signed)
RN received mychart message from pt daughter Chelsea Keller regarding pt experiencing diarrhea.  RN placed call to [facility] who explained pt started a new antibiotic (linezolid 600 mg) and has experienced diarrhea from it.  Chelsea Keller states pt took Lomotil x1 dose last night and pt has not had diarrhea as of yet.  RN instructed Chelsea Keller to monitor pt today and to contact the office tomorrow if the pt is still experiencing diarrhea and if the pt seems to be dehydrated.  Pt daughter verbalized understanding.

## 2019-07-25 ENCOUNTER — Other Ambulatory Visit: Payer: Self-pay

## 2019-07-25 ENCOUNTER — Encounter: Payer: Self-pay | Admitting: Hematology and Oncology

## 2019-07-25 DIAGNOSIS — D500 Iron deficiency anemia secondary to blood loss (chronic): Secondary | ICD-10-CM

## 2019-07-25 NOTE — Progress Notes (Signed)
RN informed patient's daughter, Ahmiracle, MD recommendations to take Lomotil TID on 1/20 due to diarrhea.  IVF's will be given tomorrow in addition to iron therapy.  RN placed orders.

## 2019-07-26 ENCOUNTER — Other Ambulatory Visit: Payer: Self-pay

## 2019-07-26 ENCOUNTER — Inpatient Hospital Stay: Payer: Medicare Other

## 2019-07-26 VITALS — BP 188/57 | HR 87 | Temp 98.3°F | Resp 17 | Wt 266.5 lb

## 2019-07-26 DIAGNOSIS — L03115 Cellulitis of right lower limb: Secondary | ICD-10-CM | POA: Diagnosis not present

## 2019-07-26 DIAGNOSIS — Z7901 Long term (current) use of anticoagulants: Secondary | ICD-10-CM | POA: Diagnosis not present

## 2019-07-26 DIAGNOSIS — D500 Iron deficiency anemia secondary to blood loss (chronic): Secondary | ICD-10-CM | POA: Diagnosis not present

## 2019-07-26 DIAGNOSIS — K922 Gastrointestinal hemorrhage, unspecified: Secondary | ICD-10-CM | POA: Diagnosis not present

## 2019-07-26 DIAGNOSIS — Z79899 Other long term (current) drug therapy: Secondary | ICD-10-CM | POA: Diagnosis not present

## 2019-07-26 DIAGNOSIS — K5520 Angiodysplasia of colon without hemorrhage: Secondary | ICD-10-CM | POA: Diagnosis not present

## 2019-07-26 MED ORDER — SODIUM CHLORIDE 0.9 % IV SOLN
200.0000 mg | Freq: Once | INTRAVENOUS | Status: AC
  Administered 2019-07-26: 15:00:00 200 mg via INTRAVENOUS
  Filled 2019-07-26: qty 200

## 2019-07-26 MED ORDER — ACETAMINOPHEN 325 MG PO TABS
650.0000 mg | ORAL_TABLET | Freq: Once | ORAL | Status: AC
  Administered 2019-07-26: 14:00:00 650 mg via ORAL

## 2019-07-26 MED ORDER — SODIUM CHLORIDE 0.9 % IV SOLN
Freq: Once | INTRAVENOUS | Status: DC
  Filled 2019-07-26: qty 250

## 2019-07-26 MED ORDER — SODIUM CHLORIDE 0.9 % IV SOLN
INTRAVENOUS | Status: AC
  Filled 2019-07-26 (×2): qty 250

## 2019-07-26 MED ORDER — ACETAMINOPHEN 325 MG PO TABS
ORAL_TABLET | ORAL | Status: AC
  Filled 2019-07-26: qty 2

## 2019-07-26 NOTE — Patient Instructions (Signed)

## 2019-07-26 NOTE — Telephone Encounter (Addendum)
Spoke with Chelsea Keller and her daughter. Chelsea Keller states that she feels the antibiotic-related diarrhea resolved after stopping linezolid and taking 1 dose of lomotil. She had to start the additional course of lomotil to prevent iron-infusion related diarrhea just after the 1 dose she took for antibiotic-related diarrhea. Patient's home health nurse is Amy [phone]) [place].  Ronelle does not want to do a stool test for c diff, stating "I know what caused this. I just want to know if the infection is gone."  She will follow up 1/25 with Dr Baxter Flattery, Londell Moh, Lanice Schwab, RN

## 2019-07-28 ENCOUNTER — Telehealth: Payer: Self-pay

## 2019-07-28 NOTE — Telephone Encounter (Signed)
COVID-19 Pre-Screening Questions:07/28/19  Do you currently have a fever (>100 F), chills or unexplained body aches?NO   Are you currently experiencing new cough, shortness of breath, sore throat, runny nose? NO .  Have you recently travelled outside the state of [place] in the last 14 days?NO  .  Have you been in contact with someone that is currently pending confirmation of Covid19 testing or has been confirmed to have the [place] virus?  NO  **If the patient answers NO to ALL questions -  advise the patient to please call the clinic before coming to the office should any symptoms develop.

## 2019-07-31 ENCOUNTER — Other Ambulatory Visit: Payer: Self-pay

## 2019-07-31 ENCOUNTER — Encounter: Payer: Self-pay | Admitting: Internal Medicine

## 2019-07-31 ENCOUNTER — Ambulatory Visit (INDEPENDENT_AMBULATORY_CARE_PROVIDER_SITE_OTHER): Payer: Medicare Other | Admitting: Internal Medicine

## 2019-07-31 VITALS — BP 156/77 | HR 79 | Temp 97.8°F | Ht 62.0 in | Wt 277.0 lb

## 2019-07-31 DIAGNOSIS — R197 Diarrhea, unspecified: Secondary | ICD-10-CM | POA: Diagnosis not present

## 2019-07-31 DIAGNOSIS — R7881 Bacteremia: Secondary | ICD-10-CM

## 2019-07-31 DIAGNOSIS — B955 Unspecified streptococcus as the cause of diseases classified elsewhere: Secondary | ICD-10-CM

## 2019-07-31 DIAGNOSIS — L853 Xerosis cutis: Secondary | ICD-10-CM

## 2019-07-31 NOTE — Progress Notes (Signed)
Patient ID: Chelsea Keller, female   DOB: [birth year], 85 y.o.   MRN: [id number]  HPI  85yo F with streptococcus gordonii bacteremia with pulmonary PE, presumed septic emboli/ endocarditis ruled out. planned on treating for 6 wk however paitent unable to tolerate oral abtx (we had switched her from ceftriaxone to linezolid  but only took for 3 days since she started to have diarrhea -- however, now she attributes diarrhea to iron infusions she has been receiving for anemia. In total, she received roughly 26 days of abtx. She reports that she is finished with taking antibiotics due to all the side effects  Outpatient Encounter Medications as of 07/31/2019  Medication Sig  . acetaminophen (TYLENOL) 500 MG tablet Take 1,000-1,500 mg by mouth every 6 (six) hours as needed for moderate pain or headache (pain).   Marland Kitchen apixaban (ELIQUIS) 5 MG TABS tablet Take 1 tablet (5 mg total) by mouth 2 (two) times daily.  . diphenoxylate-atropine (LOMOTIL) 2.5-0.025 MG tablet Take 1 tablet by mouth 4 (four) times daily as needed for diarrhea or loose stools.  . furosemide (LASIX) 80 MG tablet Take 80 mg by mouth daily.   Marland Kitchen gabapentin (NEURONTIN) 300 MG capsule Take 1 capsule in the morning and 2 capsules at bedtime  . potassium chloride SA (K-DUR,KLOR-CON) 20 MEQ tablet TAKE 2 TABLETS (40 MEQ TOTAL) DAILY (Patient taking differently: Take 40 mEq by mouth daily. )  . triamterene-hydrochlorothiazide (MAXZIDE-25) 37.5-25 MG tablet Take 1 tablet by mouth daily.  . cefTRIAXone (ROCEPHIN) IVPB Inject 2 g into the vein daily. Indication: S. gordonii bacteremia and possible endocarditis Last Day of Therapy: 07/31/2019 Labs - Once weekly:  CBC/D and BMP, Labs - Every other week:  ESR and CRP (Patient not taking: Reported on 07/18/2019)  . hydrOXYzine (ATARAX/VISTARIL) 10 MG tablet Take 1 tablet (10 mg total) by mouth 3 (three) times daily as needed. (Patient not taking: Reported on 07/31/2019)  . linezolid (ZYVOX) 600 MG  tablet Take 1 tablet (600 mg total) by mouth 2 (two) times daily. (Patient not taking: Reported on 07/31/2019)  . silver sulfADIAZINE (SILVADENE) 1 % cream Apply 1 application topically daily. (Patient not taking: Reported on 07/31/2019)  . tiZANidine (ZANAFLEX) 2 MG tablet Take 1 tablet (2 mg total) by mouth 3 (three) times daily as needed for muscle spasms. (Patient not taking: Reported on 07/31/2019)  . traMADol (ULTRAM) 50 MG tablet Take 1 tablet (50 mg total) by mouth 3 (three) times daily as needed for moderate pain. (Patient not taking: Reported on 07/31/2019)   No facility-administered encounter medications on file as of 07/31/2019.     Patient Active Problem List   Diagnosis Date Noted  . Acute respiratory failure with hypoxia ([place]) 06/18/2019  . SIRS (systemic inflammatory response syndrome) ([place]) 06/18/2019  . Suspected COVID-19 virus infection 06/18/2019  . Recurrent pulmonary emboli ([place]) 06/18/2019  . Cellulitis 05/22/2019  . Sacroiliitis ([place]) 02/17/2019  . Vertigo 01/12/2019  . Cervical strain, acute 10/31/2018  . Aortic atherosclerosis ([place]) 09/12/2018  . Iron deficiency anemia due to chronic blood loss 08/11/2018  . Diarrhea 04/20/2018  . Acute gout 04/01/2018  . Angiodysplasia of colon   . Symptomatic anemia 03/21/2018  . Degenerative joint disease (DJD) of hip, Bilateral  11/12/2017  . DDD (degenerative disc disease), lumbar 11/12/2017  . Arthritis of sacroiliac joint of both sides 11/12/2017  . Chronic anticoagulation 11/07/2017  . Neurodermatitis 07/28/2017  . Urinary incontinence 05/26/2017  . Fibromyalgia 04/03/2017  . Chronic diastolic CHF (  congestive heart failure) ([place]) 04/03/2017  . Hypertension 04/03/2017  . Hypertriglyceridemia 04/03/2017  . Vitamin B12 deficiency 04/03/2017  . CKD (chronic kidney disease) stage 3, GFR 30-59 ml/min 04/03/2017  . Left sided sciatica   . Advance directive discussed with patient 01/19/2017  . Mood disorder ([place]) 11/03/2013    . Knee joint pain 10/31/2013  . Psoriasis 02/21/2013  . Routine general medical examination at a health care facility 06/14/2012  . BMI 50.0-59.9, adult ([place]) 12/02/2011  . VENTRICULAR HYPERTROPHY, LEFT 10/19/2008  . Sleep disturbance 10/25/2007  . Venous (peripheral) insufficiency 08/11/2007  . Osteoarthritis, multiple sites 08/11/2007  . Impaired fasting glucose 08/11/2007  . Hx of adenomatous colonic polyps 05/20/2007  . B12 DEFICIENCY 09/30/2006  . Essential hypertension 09/30/2006  . Allergic rhinitis due to pollen 09/30/2006     Health Maintenance Due  Topic Date Due  . DEXA SCAN  10/27/2001  . PNA vac Low Risk Adult (1 of 2 - PCV13) 10/27/2001     Review of Systems + diarrhea, 2 bowel movements daily.  No fever, chills, nightsweats.12 point ros is otherwise negative Physical Exam   BP (!) 156/77   Pulse 79   Temp 97.8 F (36.6 C) (Oral)   Ht 5' 2" (1.575 m)   Wt 277 lb (125.6 kg)   SpO2 97%   BMI 50.66 kg/m   gen = a x o by 3 in nad HEENT = no signs of thrush Abd= NTND, BS+ Skin = no rash but has flakiness to skins arms and legs Ext = non-pitting edema  CBC Lab Results  Component Value Date   WBC 10.5 07/11/2019   RBC 3.45 (L) 07/11/2019   HGB 8.7 (L) 07/11/2019   HCT 29.3 (L) 07/11/2019   PLT 370 07/11/2019   MCV 84.9 07/11/2019   MCH 25.2 (L) 07/11/2019   MCHC 29.7 (L) 07/11/2019   RDW 17.4 (H) 07/11/2019   LYMPHSABS 1.0 07/11/2019   MONOABS 0.8 07/11/2019   EOSABS 0.9 (H) 07/11/2019    BMET Lab Results  Component Value Date   NA 143 06/21/2019   K 3.4 (L) 06/21/2019   CL 107 06/21/2019   CO2 26 06/21/2019   GLUCOSE 102 (H) 06/21/2019   BUN 24 (H) 06/21/2019   CREATININE 0.82 06/21/2019   CALCIUM 8.3 (L) 06/21/2019   GFRNONAA >60 06/21/2019   GFRAA >60 06/21/2019     Assessment and Plan  Streptococcal gordoni bacteremia, complicated = Finished 4 wks of abtx. Patient not likely to finish initial 6 wk since of intolerance to  abtx.  Dry skin = recommend eucerin cream  Non pitting edema = recommend compression stockings  Diarrhea = appears improving since cessation of antibiotic

## 2019-08-01 ENCOUNTER — Inpatient Hospital Stay: Payer: Medicare Other

## 2019-08-01 ENCOUNTER — Other Ambulatory Visit: Payer: Self-pay

## 2019-08-01 VITALS — BP 147/67 | HR 77 | Temp 97.8°F | Resp 17

## 2019-08-01 DIAGNOSIS — D500 Iron deficiency anemia secondary to blood loss (chronic): Secondary | ICD-10-CM | POA: Diagnosis not present

## 2019-08-01 DIAGNOSIS — F419 Anxiety disorder, unspecified: Secondary | ICD-10-CM

## 2019-08-01 DIAGNOSIS — Z87891 Personal history of nicotine dependence: Secondary | ICD-10-CM

## 2019-08-01 DIAGNOSIS — D631 Anemia in chronic kidney disease: Secondary | ICD-10-CM

## 2019-08-01 DIAGNOSIS — Z452 Encounter for adjustment and management of vascular access device: Secondary | ICD-10-CM | POA: Diagnosis not present

## 2019-08-01 DIAGNOSIS — I5032 Chronic diastolic (congestive) heart failure: Secondary | ICD-10-CM

## 2019-08-01 DIAGNOSIS — I0981 Rheumatic heart failure: Secondary | ICD-10-CM

## 2019-08-01 DIAGNOSIS — M160 Bilateral primary osteoarthritis of hip: Secondary | ICD-10-CM

## 2019-08-01 DIAGNOSIS — L03115 Cellulitis of right lower limb: Secondary | ICD-10-CM | POA: Diagnosis not present

## 2019-08-01 DIAGNOSIS — D509 Iron deficiency anemia, unspecified: Secondary | ICD-10-CM

## 2019-08-01 DIAGNOSIS — I130 Hypertensive heart and chronic kidney disease with heart failure and stage 1 through stage 4 chronic kidney disease, or unspecified chronic kidney disease: Secondary | ICD-10-CM

## 2019-08-01 DIAGNOSIS — F329 Major depressive disorder, single episode, unspecified: Secondary | ICD-10-CM

## 2019-08-01 DIAGNOSIS — I081 Rheumatic disorders of both mitral and tricuspid valves: Secondary | ICD-10-CM

## 2019-08-01 DIAGNOSIS — I2699 Other pulmonary embolism without acute cor pulmonale: Secondary | ICD-10-CM | POA: Diagnosis not present

## 2019-08-01 DIAGNOSIS — K5520 Angiodysplasia of colon without hemorrhage: Secondary | ICD-10-CM | POA: Diagnosis not present

## 2019-08-01 DIAGNOSIS — Z5181 Encounter for therapeutic drug level monitoring: Secondary | ICD-10-CM

## 2019-08-01 DIAGNOSIS — I700 Atherosclerosis of aorta: Secondary | ICD-10-CM

## 2019-08-01 DIAGNOSIS — Z79899 Other long term (current) drug therapy: Secondary | ICD-10-CM | POA: Diagnosis not present

## 2019-08-01 DIAGNOSIS — I872 Venous insufficiency (chronic) (peripheral): Secondary | ICD-10-CM

## 2019-08-01 DIAGNOSIS — K922 Gastrointestinal hemorrhage, unspecified: Secondary | ICD-10-CM | POA: Diagnosis not present

## 2019-08-01 DIAGNOSIS — M797 Fibromyalgia: Secondary | ICD-10-CM

## 2019-08-01 DIAGNOSIS — Z792 Long term (current) use of antibiotics: Secondary | ICD-10-CM

## 2019-08-01 DIAGNOSIS — E538 Deficiency of other specified B group vitamins: Secondary | ICD-10-CM

## 2019-08-01 DIAGNOSIS — A408 Other streptococcal sepsis: Secondary | ICD-10-CM | POA: Diagnosis not present

## 2019-08-01 DIAGNOSIS — M5136 Other intervertebral disc degeneration, lumbar region: Secondary | ICD-10-CM

## 2019-08-01 DIAGNOSIS — Z7901 Long term (current) use of anticoagulants: Secondary | ICD-10-CM

## 2019-08-01 DIAGNOSIS — J9601 Acute respiratory failure with hypoxia: Secondary | ICD-10-CM

## 2019-08-01 DIAGNOSIS — N183 Chronic kidney disease, stage 3 unspecified: Secondary | ICD-10-CM

## 2019-08-01 MED ORDER — ACETAMINOPHEN 325 MG PO TABS: 650.0000 mg | ORAL_TABLET | Freq: Once | ORAL | Status: DC

## 2019-08-01 MED ORDER — SODIUM CHLORIDE 0.9 % IV SOLN
200.0000 mg | Freq: Once | INTRAVENOUS | Status: AC
  Administered 2019-08-01: 200 mg via INTRAVENOUS
  Filled 2019-08-01: qty 200

## 2019-08-01 MED ORDER — SODIUM CHLORIDE 0.9 % IV SOLN
Freq: Once | INTRAVENOUS | Status: AC
  Filled 2019-08-01: qty 250

## 2019-08-08 ENCOUNTER — Other Ambulatory Visit: Payer: Self-pay

## 2019-08-08 ENCOUNTER — Inpatient Hospital Stay: Payer: Medicare Other | Attending: Hematology and Oncology

## 2019-08-08 VITALS — BP 164/79 | HR 93 | Temp 98.3°F | Resp 18

## 2019-08-08 DIAGNOSIS — D500 Iron deficiency anemia secondary to blood loss (chronic): Secondary | ICD-10-CM | POA: Diagnosis not present

## 2019-08-08 DIAGNOSIS — K922 Gastrointestinal hemorrhage, unspecified: Secondary | ICD-10-CM | POA: Insufficient documentation

## 2019-08-08 DIAGNOSIS — I2782 Chronic pulmonary embolism: Secondary | ICD-10-CM | POA: Insufficient documentation

## 2019-08-08 DIAGNOSIS — R197 Diarrhea, unspecified: Secondary | ICD-10-CM | POA: Diagnosis not present

## 2019-08-08 MED ORDER — ACETAMINOPHEN 325 MG PO TABS: 650.0000 mg | ORAL_TABLET | Freq: Once | ORAL | Status: DC

## 2019-08-08 MED ORDER — SODIUM CHLORIDE 0.9 % IV SOLN
Freq: Once | INTRAVENOUS | Status: AC
  Filled 2019-08-08: qty 250

## 2019-08-08 MED ORDER — SODIUM CHLORIDE 0.9 % IV SOLN
200.0000 mg | Freq: Once | INTRAVENOUS | Status: AC
  Administered 2019-08-08: 14:00:00 200 mg via INTRAVENOUS
  Filled 2019-08-08: qty 200

## 2019-08-08 NOTE — Patient Instructions (Signed)

## 2019-08-14 ENCOUNTER — Ambulatory Visit (INDEPENDENT_AMBULATORY_CARE_PROVIDER_SITE_OTHER): Payer: Medicare Other | Admitting: Cardiovascular Disease

## 2019-08-14 ENCOUNTER — Encounter: Payer: Self-pay | Admitting: Cardiovascular Disease

## 2019-08-14 ENCOUNTER — Other Ambulatory Visit: Payer: Self-pay

## 2019-08-14 VITALS — BP 110/70 | HR 88 | Ht 62.0 in | Wt 273.1 lb

## 2019-08-14 DIAGNOSIS — Z86711 Personal history of pulmonary embolism: Secondary | ICD-10-CM | POA: Diagnosis not present

## 2019-08-14 DIAGNOSIS — I10 Essential (primary) hypertension: Secondary | ICD-10-CM

## 2019-08-14 DIAGNOSIS — I5032 Chronic diastolic (congestive) heart failure: Secondary | ICD-10-CM | POA: Diagnosis not present

## 2019-08-14 NOTE — Patient Instructions (Signed)
Medication Instructions:  No changes *If you need a refill on your cardiac medications before your next appointment, please call your pharmacy*  Lab Work: none If you have labs (blood work) drawn today and your tests are completely normal, you will receive your results only by: . MyChart Message (if you have MyChart) OR . A paper copy in the mail If you have any lab test that is abnormal or we need to change your treatment, we will call you to review the results.  Testing/Procedures: none  Follow-Up: At [facility], you and your health needs are our priority.  As part of our continuing mission to provide you with exceptional heart care, we have created designated Provider Care Teams.  These Care Teams include your primary Cardiologist (physician) and Advanced Practice Providers (APPs -  Physician Assistants and Nurse Practitioners) who all work together to provide you with the care you need, when you need it.  Your next appointment:   12 month(s)  The format for your next appointment:   Either In Person or Virtual  Provider:   You may see Christopher McAlhany, MD or one of the following Advanced Practice Providers on your designated Care Team:    Dayna Dunn, PA-C  Michele Lenze, PA-C   Other Instructions   

## 2019-08-14 NOTE — Progress Notes (Signed)
0  Chief Complaint  Patient presents with  . Follow-up    CHF   History of Present Illness: 85 yo female with history of anemia/ GI bleeding, chronic diastolic CHF, PE, obesity, HTN, PVCs and chronic lower extremity edema here today for cardiac follow up. She has a remote history of tobacco abuse. She had a normal myoview stress test in April of 2008. Her echo in May 2010 showed normal LV function and no significant valvular issues. I saw her in January 2016 and she c/o weakness, dizziness, dyspnea.  She was seen in primary care but chest x-ray was ok. She has chronic lower extremity edema and had been off of her Lasix. She endorsed a cough productive of clear sputum. She was admitted to [facility] for diuresis and aldactone was added. Echo with normal LV function in 2016. CTA chest negative for PE (elevated d-dimer). She was found to be anemic with Hgb of 8.7 (microcytic hypochromic). She was referred to GI and underwent EGD and colonoscopy 10/15/14. This showed gastritis, AVMs in the colon, polyps and divertculosis, hemorrhoids. Echo in 2018 with normal LV systolic function, grade 1 diastolic dysfunction and trivial AI. She was admitted to [facility] December 2018 with dyspnea and was found to have bilateral pulmonary emboli. She was started on Eliquis. She called our office 03/21/18 with c/o dyspnea, chest pressure and dizziness. We asked her to come into the ED. Chest x-ray was ok. She was found to have severe anemia (Hgb 4.7) felt to be due to active GI bleeding. She was found to have AVMs which were treated. She was transfused 4 units of pRBCs. Her symptoms resolved with resolution of her anemia. She was seen in the ED 04/11/18 with a GI illness with nausea and abdominal pain/dark stools. Blood counts were stable. CT abdomen without acute cause of abdominal pain. In October 2019 she was seen for dizziness. Cardiac monitor with frequent PVCs. She did not wish to start Cardizem. Eliquis was stopped by hematology in  March 2020. She was feeling better at office follow up in January 2020. Most recent echo December 2020 with LVEF=60-65%, mild MR. This was done while she was admitted in December 2020 with recurrent PE, pneumonia and possible LE cellulitis.   She is here today for follow up. The patient denies any chest pain, dyspnea, palpitations, lower extremity edema, orthopnea, PND, dizziness, near syncope or syncope.   Primary Care Physician: Venia Carbon, MD  Past Medical History:  Diagnosis Date  . Allergy   . Anemia   . Anxiety   . Arthritis   . Arthritis of sacroiliac joint of both sides 11/12/2017  . Bilateral pulmonary embolism ([place]) 06/23/2017  . Chronic diastolic CHF (congestive heart failure) (HCC)    a. Echo 1/16:  mild LVH, EF normal, grade 1 DD, MAC  . Chronic venous insufficiency    chronic LE edema  . DDD (degenerative disc disease), lumbar 11/12/2017  . Degenerative joint disease (DJD) of hip, Bilateral  11/12/2017  . Depression   . Fibromyalgia    constant pain  . Hx of cardiac catheterization    a. LHC in [place] "ok" per patient with mild plaque in a single vessel - records not available  . Hx of cardiovascular stress test    a. Nuclear study in 2008 normal  . Hx of colonic polyps   . Hypertension   . Hypertriglyceridemia   . Impaired fasting glucose   . PONV (postoperative nausea and vomiting)   . PUD (  peptic ulcer disease)    hx of gastric ulcer  . Pulmonary emboli ([employer]) 06/2017  . Vitamin B12 deficiency     Past Surgical History:  Procedure Laterality Date  . ABDOMINAL HYSTERECTOMY    . CATARACT EXTRACTION  10/2003   OD  . CHOLECYSTECTOMY    . COLONOSCOPY W/ POLYPECTOMY    . COLONOSCOPY WITH PROPOFOL N/A 10/15/2014   Procedure: COLONOSCOPY WITH PROPOFOL;  Surgeon: Ladene Artist, MD;  Location: [facility] ENDOSCOPY;  Service: Endoscopy;  Laterality: N/A;  . COLONOSCOPY WITH PROPOFOL N/A 03/23/2018   Procedure: COLONOSCOPY WITH PROPOFOL;  Surgeon: Gatha Mayer, MD;   Location: [facility] ENDOSCOPY;  Service: Endoscopy;  Laterality: N/A;  . ESOPHAGOGASTRODUODENOSCOPY (EGD) WITH PROPOFOL N/A 10/15/2014   Procedure: ESOPHAGOGASTRODUODENOSCOPY (EGD) WITH PROPOFOL;  Surgeon: Ladene Artist, MD;  Location: [facility] ENDOSCOPY;  Service: Endoscopy;  Laterality: N/A;  . EXTERNAL FIXATION ANKLE FRACTURE     Fx.  left ankle-fixation with pins later removed sec to infection 1985  . EXTERNAL FIXATION WRIST FRACTURE  1985   left with pins  . EYE SURGERY    . FEMUR FRACTURE SURGERY  06/2009  . FRACTURE SURGERY    . HARDWARE REMOVAL Left 10/05/2013   Procedure: HARDWARE REMOVAL LEFT DISTAL FEMUR;  Surgeon: Rozanna Box, MD;  Location: [place];  Service: Orthopedics;  Laterality: Left;  . HEMIARTHROPLASTY SHOULDER FRACTURE  06/2009  . HOT HEMOSTASIS N/A 03/23/2018   Procedure: HOT HEMOSTASIS (ARGON PLASMA COAGULATION/BICAP);  Surgeon: Gatha Mayer, MD;  Location: [facility] ENDOSCOPY;  Service: Endoscopy;  Laterality: N/A;  . JOINT REPLACEMENT    . STERIOD INJECTION Right 10/05/2013   Procedure: STEROID INJECTION;  Surgeon: Rozanna Box, MD;  Location: [place];  Service: Orthopedics;  Laterality: Right;  . TONSILLECTOMY    . TONSILLECTOMY    . TOTAL KNEE ARTHROPLASTY  03/09   left    Current Outpatient Medications  Medication Sig Dispense Refill  . acetaminophen (TYLENOL) 500 MG tablet Take 1,000-1,500 mg by mouth every 6 (six) hours as needed for moderate pain or headache (pain).     Marland Kitchen diphenoxylate-atropine (LOMOTIL) 2.5-0.025 MG tablet Take 1 tablet by mouth 4 (four) times daily as needed for diarrhea or loose stools. 30 tablet 0  . furosemide (LASIX) 80 MG tablet Take 80 mg by mouth daily.     Marland Kitchen gabapentin (NEURONTIN) 300 MG capsule Take 1 capsule in the morning and 2 capsules at bedtime 21 capsule 0  . potassium chloride SA (K-DUR,KLOR-CON) 20 MEQ tablet TAKE 2 TABLETS (40 MEQ TOTAL) DAILY (Patient taking differently: Take 40 mEq by mouth daily. ) 180 tablet 4  . traMADol (ULTRAM)  50 MG tablet Take 1 tablet (50 mg total) by mouth 3 (three) times daily as needed for moderate pain. 60 tablet 0  . triamterene-hydrochlorothiazide (MAXZIDE-25) 37.5-25 MG tablet Take 1 tablet by mouth daily. 90 tablet 3  . apixaban (ELIQUIS) 5 MG TABS tablet Take 1 tablet (5 mg total) by mouth 2 (two) times daily. 180 tablet 3   No current facility-administered medications for this visit.    Allergies  Allergen Reactions  . Codeine Sulfate Shortness Of Breath  . Celecoxib Other (See Comments)    Caused vaginal bleeding  . Erythromycin Base Nausea And Vomiting    Social History   Socioeconomic History  . Marital status: Widowed    Spouse name: Not on file  . Number of children: 4  . Years of education: Not on file  .  Highest education level: Not on file  Occupational History  . Occupation: retired crossing guard  . 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 Smoking status: Former Smoker    Packs/day: 1.00    Years: 40.00    Pack years: 40.00    Types: Cigarettes    Quit date: 07/06/1993    Years since quitting: 26.1  . Smokeless tobacco: Never Used  Substance and Sexual Activity  . Alcohol use: No    Alcohol/week: 0.0 standard drinks  . Drug use: No  . Sexual activity: Not on file  Other Topics Concern  . 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 Difficulty of Paying Living Expenses: Not on file  Food Insecurity:   . Worried About Charity fundraiser in the Last Year: Not on file  . Ran Out of Food in the Last Year: Not on file  Transportation Needs:   . Lack of Transportation  (Medical): Not on file  . Lack of Transportation (Non-Medical): Not on file  Physical Activity:   . Days of Exercise per Week: Not on file  . Minutes of Exercise per Session: Not on file  Stress:   . Feeling of Stress : Not on file  Social Connections:   . Frequency of Communication with Friends and Family: Not on file  . Frequency of Social Gatherings with Friends and Family: Not on file  . Attends Religious Services: Not on file  . Active Member of Clubs or Organizations: Not on file  . Attends Archivist Meetings: Not on file  . Marital Status: Not on file  Intimate Partner Violence:   . Fear of Current or Ex-Partner: Not on file  . Emotionally Abused: Not on file  . Physically Abused: Not on file  . Sexually Abused: Not on file    Family History  Problem Relation Age of Onset  . Depression Mother   . Cancer Mother        uterine cancer  . Heart attack Father   . Cancer Brother        prostate cancer  . Diabetes Maternal Aunt   . Arthritis Brother   . Asthma Brother   . Stroke Maternal Grandmother   . Pulmonary embolism Daughter     Review of Systems:  As stated in the HPI and otherwise negative.   BP 110/70   Pulse 88   Ht _0  (1.575 m)   Wt 273 lb 1.9 oz (123.9 kg)   SpO2 97%   BMI 49.95 kg/m   Physical Examination:  General: Well developed, well nourished, NAD  HEENT: OP clear, mucus membranes moist  SKIN: warm, dry. No rashes. Neuro: No focal deficits  Musculoskeletal: Muscle strength 5/5 all ext  Psychiatric: Mood and affect normal  Neck: No JVD, no carotid bruits, no thyromegaly, no lymphadenopathy.  Lungs:Clear bilaterally, no wheezes, rhonci, crackles Cardiovascular: Regular rate and rhythm. No murmurs, gallops or rubs. Abdomen:Soft. Bowel sounds present. Non-tender.  Extremities: No lower extremity edema. Pulses are 2 + in  the bilateral DP/PT.  Echo December 2020: 1. Left ventricular ejection fraction, by visual estimation, is 60  to  65%. The left ventricle has normal function. There is mildly increased  left ventricular hypertrophy.  2. Left ventricular diastolic parameters are consistent with Grade I  diastolic dysfunction (impaired relaxation).  3. The left ventricle has no regional wall motion abnormalities.  4. Global right ventricle has normal systolic function.The right  ventricular size is normal. No increase in right ventricular wall  thickness.  5. Left atrial size was normal.  6. Right atrial size was normal.  7. Mild mitral annular calcification.  8. The mitral valve is abnormal. Mild mitral valve regurgitation.  9. The tricuspid valve is normal in structure. Tricuspid valve  regurgitation is mild.  10. The aortic valve is grossly normal. Aortic valve regurgitation is not  visualized.  11. The pulmonic valve was normal in structure. Pulmonic valve  regurgitation is not visualized.  12. Mildly elevated pulmonary artery systolic pressure.   EKG:  EKG is not ordered today. The ekg ordered today demonstrates   Recent Labs: 06/21/2019: ALT 15; BUN 24; Creatinine, Ser 0.82; Magnesium 2.1; Potassium 3.4; Sodium 143 07/11/2019: Hemoglobin 8.7; Platelet Count 370   Lipid Panel    Component Value Date/Time   CHOL 177 06/23/2017 1020   TRIG 116 06/18/2019 0004   HDL 44 06/23/2017 1020   CHOLHDL 4.0 06/23/2017 1020   VLDL 16 06/23/2017 1020   LDLCALC 117 (H) 06/23/2017 1020   LDLDIRECT 86.2 05/25/2008 1115     Wt Readings from Last 3 Encounters:  08/14/19 273 lb 1.9 oz (123.9 kg)  07/31/19 277 lb (125.6 kg)  07/26/19 266 lb 8 oz (120.9 kg)     Other studies Reviewed: Additional studies/ records that were reviewed today include:  Review of the above records demonstrates:    Assessment and Plan:   1. Chronic diastolic CHF:. She has normal LV systolic function by echo in December 2020. She has grade 1 diastolic dysfunction.  Weight is stable today. Continue Lasix.     2. HTN: BP is  controlled.   3. Bilateral PE: She is on Eliquis again after recurrent PE in December 2020. I reviewed her most recent CBC from January 2021 which showed stable chronic anemia, Hgb 8.7.   4. GI bleeding: GI bleeding due to [facility] and treated with [facility] September 2019. H/H stable by most recent check January 2020  Current medicines are reviewed at length with the patient today.  The patient does not have concerns regarding medicines.  The following changes have been made:  no change  Labs/ tests ordered today include:   No orders of the defined types were placed in this encounter.  Disposition:   FU with me in 12 months.     Signed, Lauree Chandler, MD 08/14/2019 10:26 AM    [facility] [address], [place], [place]  [address] Phone: [phone]; Fax: [phone]

## 2019-08-18 ENCOUNTER — Telehealth: Payer: Self-pay

## 2019-08-18 NOTE — Telephone Encounter (Signed)
Cecile Hearing, physical therapist with [facility], asking for verbal orders for re certification for additional PT services for 1 times a week for 4 weeks and then 1 every other week for 4 weeks. Patient is making good progress but just taking a little bit longer. Cecile Hearing CB is [phone].

## 2019-08-18 NOTE — Telephone Encounter (Signed)
Verbal orders given to [place].

## 2019-08-18 NOTE — Telephone Encounter (Signed)
That is fine 

## 2019-08-21 DIAGNOSIS — Z792 Long term (current) use of antibiotics: Secondary | ICD-10-CM | POA: Diagnosis not present

## 2019-08-21 DIAGNOSIS — F329 Major depressive disorder, single episode, unspecified: Secondary | ICD-10-CM | POA: Diagnosis not present

## 2019-08-21 DIAGNOSIS — A408 Other streptococcal sepsis: Secondary | ICD-10-CM | POA: Diagnosis not present

## 2019-08-21 DIAGNOSIS — D631 Anemia in chronic kidney disease: Secondary | ICD-10-CM | POA: Diagnosis not present

## 2019-08-21 DIAGNOSIS — I130 Hypertensive heart and chronic kidney disease with heart failure and stage 1 through stage 4 chronic kidney disease, or unspecified chronic kidney disease: Secondary | ICD-10-CM | POA: Diagnosis not present

## 2019-08-21 DIAGNOSIS — Z452 Encounter for adjustment and management of vascular access device: Secondary | ICD-10-CM | POA: Diagnosis not present

## 2019-08-21 DIAGNOSIS — M797 Fibromyalgia: Secondary | ICD-10-CM | POA: Diagnosis not present

## 2019-08-21 DIAGNOSIS — M160 Bilateral primary osteoarthritis of hip: Secondary | ICD-10-CM | POA: Diagnosis not present

## 2019-08-21 DIAGNOSIS — Z5181 Encounter for therapeutic drug level monitoring: Secondary | ICD-10-CM | POA: Diagnosis not present

## 2019-08-21 DIAGNOSIS — I700 Atherosclerosis of aorta: Secondary | ICD-10-CM | POA: Diagnosis not present

## 2019-08-21 DIAGNOSIS — I0981 Rheumatic heart failure: Secondary | ICD-10-CM | POA: Diagnosis not present

## 2019-08-21 DIAGNOSIS — I5032 Chronic diastolic (congestive) heart failure: Secondary | ICD-10-CM | POA: Diagnosis not present

## 2019-08-21 DIAGNOSIS — Z7901 Long term (current) use of anticoagulants: Secondary | ICD-10-CM | POA: Diagnosis not present

## 2019-08-21 DIAGNOSIS — Z87891 Personal history of nicotine dependence: Secondary | ICD-10-CM | POA: Diagnosis not present

## 2019-08-21 DIAGNOSIS — I872 Venous insufficiency (chronic) (peripheral): Secondary | ICD-10-CM | POA: Diagnosis not present

## 2019-08-21 DIAGNOSIS — F419 Anxiety disorder, unspecified: Secondary | ICD-10-CM | POA: Diagnosis not present

## 2019-08-21 DIAGNOSIS — J9601 Acute respiratory failure with hypoxia: Secondary | ICD-10-CM | POA: Diagnosis not present

## 2019-08-21 DIAGNOSIS — L03115 Cellulitis of right lower limb: Secondary | ICD-10-CM | POA: Diagnosis not present

## 2019-08-21 DIAGNOSIS — E538 Deficiency of other specified B group vitamins: Secondary | ICD-10-CM | POA: Diagnosis not present

## 2019-08-21 DIAGNOSIS — N183 Chronic kidney disease, stage 3 unspecified: Secondary | ICD-10-CM | POA: Diagnosis not present

## 2019-08-21 DIAGNOSIS — I2699 Other pulmonary embolism without acute cor pulmonale: Secondary | ICD-10-CM | POA: Diagnosis not present

## 2019-08-21 DIAGNOSIS — D509 Iron deficiency anemia, unspecified: Secondary | ICD-10-CM | POA: Diagnosis not present

## 2019-08-21 DIAGNOSIS — M5136 Other intervertebral disc degeneration, lumbar region: Secondary | ICD-10-CM | POA: Diagnosis not present

## 2019-08-21 DIAGNOSIS — I081 Rheumatic disorders of both mitral and tricuspid valves: Secondary | ICD-10-CM | POA: Diagnosis not present

## 2019-08-21 MED ORDER — GABAPENTIN 300 MG PO CAPS: ORAL_CAPSULE | ORAL | 3 refills | Status: DC

## 2019-09-03 DIAGNOSIS — D649 Anemia, unspecified: Secondary | ICD-10-CM

## 2019-09-04 NOTE — Telephone Encounter (Signed)
I ordered a CBC for her. Set up the lab work. She doesn't need separate iron--if her hemoglobin is low, she will need to go back to the hematologist

## 2019-09-06 ENCOUNTER — Other Ambulatory Visit (INDEPENDENT_AMBULATORY_CARE_PROVIDER_SITE_OTHER): Payer: Medicare Other

## 2019-09-06 DIAGNOSIS — D649 Anemia, unspecified: Secondary | ICD-10-CM

## 2019-09-06 LAB — CBC
HCT: 30.4 % — ABNORMAL LOW (ref 36.0–46.0)
Hemoglobin: 9.7 g/dL — ABNORMAL LOW (ref 12.0–15.0)
MCHC: 31.8 g/dL (ref 30.0–36.0)
MCV: 83.4 fl (ref 78.0–100.0)
Platelets: 327 10*3/uL (ref 150.0–400.0)
RBC: 3.64 Mil/uL — ABNORMAL LOW (ref 3.87–5.11)
RDW: 21.1 % — ABNORMAL HIGH (ref 11.5–15.5)
WBC: 6.4 10*3/uL (ref 4.0–10.5)

## 2019-09-08 ENCOUNTER — Encounter: Payer: Self-pay | Admitting: Family Medicine

## 2019-09-08 ENCOUNTER — Ambulatory Visit (INDEPENDENT_AMBULATORY_CARE_PROVIDER_SITE_OTHER): Payer: Medicare Other | Admitting: Family Medicine

## 2019-09-08 ENCOUNTER — Other Ambulatory Visit: Payer: Self-pay

## 2019-09-08 VITALS — BP 127/55 | HR 97 | Temp 97.5°F | Ht 62.0 in | Wt 280.5 lb

## 2019-09-08 DIAGNOSIS — B372 Candidiasis of skin and nail: Secondary | ICD-10-CM | POA: Diagnosis not present

## 2019-09-08 DIAGNOSIS — I5032 Chronic diastolic (congestive) heart failure: Secondary | ICD-10-CM | POA: Diagnosis not present

## 2019-09-08 MED ORDER — NYSTATIN 100000 UNIT/GM EX POWD: 1.0000 "application " | Freq: Three times a day (TID) | CUTANEOUS | 1 refills | Status: DC

## 2019-09-08 MED ORDER — HYDROXYZINE HCL 10 MG PO TABS: 10.0000 mg | ORAL_TABLET | Freq: Three times a day (TID) | ORAL | 0 refills | Status: DC | PRN

## 2019-09-08 NOTE — Assessment & Plan Note (Signed)
Appears to have mild peripheral edema increase.. use lasix prn.. fluid overload may be contributing to weeping skin ( none seen on exam today.

## 2019-09-08 NOTE — Assessment & Plan Note (Signed)
Keep area dry, treat with nystatin and atarax for itching.  No sign of bacterial infection.

## 2019-09-08 NOTE — Patient Instructions (Addendum)
Can use hydroxyzine as needed for ithcing.  Apply  Nystatin powder to affected areas.  Use lasix as needed for peripheral swelling.  Follow up  if not improving in 1-2 weeks.

## 2019-09-08 NOTE — Progress Notes (Signed)
Chief Complaint  Patient presents with  . Skin Seeping    Right Side    History of Present Illness: HPI   85 year old female  Pt of Dr. Silvio Pate with  complicated medical history of  Including CHF, CKD, HTN, neurodermatitis, venous insufficiency, recent hospitalization for PE, and strep bacteremia.   In last 3 days she has noted a new onset  clear discharge on right abdomen, area is very itchy. She has noted rash on right abdomen in skin fold    On lasix and maxide diuretics.  Treated for right lower leg cellulitis in 05/2019 with Augmentin.   She has had a total of 1 month of antibitoics  This visit occurred during the SARS-CoV-2 public health emergency.  Safety protocols were in place, including screening questions prior to the visit, additional usage of staff PPE, and extensive cleaning of exam room while observing appropriate contact time as indicated for disinfecting solutions.   COVID 19 screen:  No recent travel or known exposure to COVID19 The patient denies respiratory symptoms of COVID 19 at this time. The importance of social distancing was discussed today.     ROS    Past Medical History:  Diagnosis Date  . Allergy   . Anemia   . Anxiety   . Arthritis   . Arthritis of sacroiliac joint of both sides 11/12/2017  . Bilateral pulmonary embolism ([place]) 06/23/2017  . Chronic diastolic CHF (congestive heart failure) (HCC)    a. Echo 1/16:  mild LVH, EF normal, grade 1 DD, MAC  . Chronic venous insufficiency    chronic LE edema  . DDD (degenerative disc disease), lumbar 11/12/2017  . Degenerative joint disease (DJD) of hip, Bilateral  11/12/2017  . Depression   . Fibromyalgia    constant pain  . Hx of cardiac catheterization    a. LHC in [place] "ok" per patient with mild plaque in a single vessel - records not available  . Hx of cardiovascular stress test    a. Nuclear study in 2008 normal  . Hx of colonic polyps   . Hypertension   . Hypertriglyceridemia   . Impaired  fasting glucose   . PONV (postoperative nausea and vomiting)   . PUD (peptic ulcer disease)    hx of gastric ulcer  . Pulmonary emboli ([place]) 06/2017  . Vitamin B12 deficiency     reports that she quit smoking about 26 years ago. Her smoking use included cigarettes. She has a 40.00 pack-year smoking history. She has never used smokeless tobacco. She reports that she does not drink alcohol or use drugs.   Current Outpatient Medications:  .  acetaminophen (TYLENOL) 500 MG tablet, Take 1,000-1,500 mg by mouth every 6 (six) hours as needed for moderate pain or headache (pain). , Disp: , Rfl:  .  apixaban (ELIQUIS) 5 MG TABS tablet, Take 1 tablet (5 mg total) by mouth 2 (two) times daily., Disp: 180 tablet, Rfl: 3 .  diphenoxylate-atropine (LOMOTIL) 2.5-0.025 MG tablet, Take 1 tablet by mouth 4 (four) times daily as needed for diarrhea or loose stools., Disp: 30 tablet, Rfl: 0 .  furosemide (LASIX) 80 MG tablet, Take 80 mg by mouth daily. , Disp: , Rfl:  .  gabapentin (NEURONTIN) 300 MG capsule, Take 1 capsule in the morning and 2 capsules at bedtime, Disp: 270 capsule, Rfl: 3 .  potassium chloride SA (K-DUR,KLOR-CON) 20 MEQ tablet, TAKE 2 TABLETS (40 MEQ TOTAL) DAILY (Patient taking differently: Take 40 mEq by mouth  daily. ), Disp: 180 tablet, Rfl: 4 .  traMADol (ULTRAM) 50 MG tablet, Take 1 tablet (50 mg total) by mouth 3 (three) times daily as needed for moderate pain., Disp: 60 tablet, Rfl: 0 .  triamterene-hydrochlorothiazide (MAXZIDE-25) 37.5-25 MG tablet, Take 1 tablet by mouth daily., Disp: 90 tablet, Rfl: 3   Observations/Objective:Marland Kitchen Blood pressure (!) 127/55, pulse 97, temperature (!) 97.5 F (36.4 C), temperature source Temporal, height _0  (1.575 m), weight 280 lb 8 oz (127.2 kg), SpO2 97 %.  Physical Exam Constitutional:      General: She is not in acute distress.    Appearance: Normal appearance. She is well-developed. She is obese. She is not ill-appearing or toxic-appearing.      Comments: Central obseity with large pannus  HENT:     Head: Normocephalic.     Right Ear: Hearing, tympanic membrane, ear canal and external ear normal. Tympanic membrane is not erythematous, retracted or bulging.     Left Ear: Hearing, tympanic membrane, ear canal and external ear normal. Tympanic membrane is not erythematous, retracted or bulging.     Nose: No mucosal edema or rhinorrhea.     Right Sinus: No maxillary sinus tenderness or frontal sinus tenderness.     Left Sinus: No maxillary sinus tenderness or frontal sinus tenderness.     Mouth/Throat:     Pharynx: Uvula midline.  Eyes:     General: Lids are normal. Lids are everted, no foreign bodies appreciated.     Conjunctiva/sclera: Conjunctivae normal.     Pupils: Pupils are equal, round, and reactive to light.  Neck:     Thyroid: No thyroid mass or thyromegaly.     Vascular: No carotid bruit.     Trachea: Trachea normal.  Cardiovascular:     Rate and Rhythm: Normal rate and regular rhythm.     Pulses: Normal pulses.     Heart sounds: Normal heart sounds, S1 normal and S2 normal. No murmur. No friction rub. No gallop.   Pulmonary:     Effort: Pulmonary effort is normal. No tachypnea or respiratory distress.     Breath sounds: Normal breath sounds. No decreased breath sounds, wheezing, rhonchi or rales.  Abdominal:     General: Bowel sounds are normal.     Palpations: Abdomen is soft.     Tenderness: There is no abdominal tenderness.  Musculoskeletal:     Cervical back: Normal range of motion and neck supple.  Skin:    General: Skin is warm and dry.     Findings: No rash.     Comments:  Redness and moisture in intertriginous areas, excoriations and scabs from itching.  Neurological:     Mental Status: She is alert.  Psychiatric:        Mood and Affect: Mood is not anxious or depressed.        Speech: Speech normal.        Behavior: Behavior normal. Behavior is cooperative.        Thought Content: Thought content  normal.        Judgment: Judgment normal.      Assessment and Plan Candidiasis, intertriginous  Keep area dry, treat with nystatin and atarax for itching.  No sign of bacterial infection.  Chronic diastolic CHF (congestive heart failure) (HCC)  Appears to have mild peripheral edema increase.. use lasix prn.. fluid overload may be contributing to weeping skin ( none seen on exam today.       Eliezer Lofts, MD

## 2019-09-18 ENCOUNTER — Ambulatory Visit (INDEPENDENT_AMBULATORY_CARE_PROVIDER_SITE_OTHER): Payer: Medicare Other | Admitting: Internal Medicine

## 2019-09-18 ENCOUNTER — Other Ambulatory Visit: Payer: Self-pay

## 2019-09-18 ENCOUNTER — Encounter: Payer: Self-pay | Admitting: Internal Medicine

## 2019-09-18 VITALS — BP 138/66 | HR 84 | Temp 97.7°F | Ht 62.0 in | Wt 280.0 lb

## 2019-09-18 DIAGNOSIS — Z6841 Body Mass Index (BMI) 40.0 and over, adult: Secondary | ICD-10-CM

## 2019-09-18 DIAGNOSIS — E538 Deficiency of other specified B group vitamins: Secondary | ICD-10-CM

## 2019-09-18 DIAGNOSIS — Z7189 Other specified counseling: Secondary | ICD-10-CM

## 2019-09-18 DIAGNOSIS — Z8739 Personal history of other diseases of the musculoskeletal system and connective tissue: Secondary | ICD-10-CM | POA: Diagnosis not present

## 2019-09-18 DIAGNOSIS — I5032 Chronic diastolic (congestive) heart failure: Secondary | ICD-10-CM | POA: Diagnosis not present

## 2019-09-18 DIAGNOSIS — Z0000 Encounter for general adult medical examination without abnormal findings: Secondary | ICD-10-CM

## 2019-09-18 DIAGNOSIS — F39 Unspecified mood [affective] disorder: Secondary | ICD-10-CM | POA: Diagnosis not present

## 2019-09-18 DIAGNOSIS — I2699 Other pulmonary embolism without acute cor pulmonale: Secondary | ICD-10-CM | POA: Diagnosis not present

## 2019-09-18 DIAGNOSIS — D6869 Other thrombophilia: Secondary | ICD-10-CM | POA: Diagnosis not present

## 2019-09-18 NOTE — Assessment & Plan Note (Signed)
Persistent bruising on the eliquis

## 2019-09-18 NOTE — Assessment & Plan Note (Signed)
Needs to stay on the eliquis indefinitely

## 2019-09-18 NOTE — Progress Notes (Signed)
Subjective:    Patient ID: Chelsea Keller, female    DOB: May 03, 1935, 85 y.o.   MRN: [id number]  HPI Here with daughter for Medicare wellness visit and follow up of chronic health conditions This visit occurred during the SARS-CoV-2 public health emergency.  Safety protocols were in place, including screening questions prior to the visit, additional usage of staff PPE, and extensive cleaning of exam room while observing appropriate contact time as indicated for disinfecting solutions.   Reviewed form and advanced directives Reviewed other doctors No alcohol or tobacco Not able to exercise--doing some physical therapy Vision worsening---going to eye doctor Hearing is good No falls Will occasionally help with cooking. Doesn't drive or do steps No sig memory issues  Painful left DIP 2nd finger Has had gout before--wonders about recurrence Very painful  Surprised her Hgb was up some Very fatigued Still working with PT--extended visits Has plans for home program when they finish  No fever Episodic cough--relates to nasal drip Done with antibiotics from hospital stay No chest pain or palpitations Significant DOE--just walking to bathroom. No real change Chronic edema--- uses the lasix prn (more regularly now)--takes 40 Also had some weeping from right flank  Mood okay Frustrated by lack of stamina Lonely at times Doesn't feel depressed Angry at "being aged"---doesn't see other daughters much, etc  Current Outpatient Medications on File Prior to Visit  Medication Sig Dispense Refill  . acetaminophen (TYLENOL) 500 MG tablet Take 1,000-1,500 mg by mouth every 6 (six) hours as needed for moderate pain or headache (pain).     Marland Kitchen apixaban (ELIQUIS) 5 MG TABS tablet Take 1 tablet (5 mg total) by mouth 2 (two) times daily. 180 tablet 3  . furosemide (LASIX) 80 MG tablet Take 80 mg by mouth daily. Pt is cutting in half    . gabapentin (NEURONTIN) 300 MG capsule Take 1 capsule in the  morning and 2 capsules at bedtime 270 capsule 3  . nystatin (NYSTATIN) powder Apply 1 application topically 3 (three) times daily. 60 g 1  . potassium chloride SA (K-DUR,KLOR-CON) 20 MEQ tablet TAKE 2 TABLETS (40 MEQ TOTAL) DAILY (Patient taking differently: Take 40 mEq by mouth daily. ) 180 tablet 4  . triamterene-hydrochlorothiazide (MAXZIDE-25) 37.5-25 MG tablet Take 1 tablet by mouth daily. 90 tablet 3   No current facility-administered medications on file prior to visit.    Allergies  Allergen Reactions  . Codeine Sulfate Shortness Of Breath  . Celecoxib Other (See Comments)    Caused vaginal bleeding  . Erythromycin Base Nausea And Vomiting    Past Medical History:  Diagnosis Date  . Allergy   . Anemia   . Anxiety   . Arthritis   . Arthritis of sacroiliac joint of both sides 11/12/2017  . Bilateral pulmonary embolism ([place]) 06/23/2017  . Chronic diastolic CHF (congestive heart failure) (HCC)    a. Echo 1/16:  mild LVH, EF normal, grade 1 DD, MAC  . Chronic venous insufficiency    chronic LE edema  . DDD (degenerative disc disease), lumbar 11/12/2017  . Degenerative joint disease (DJD) of hip, Bilateral  11/12/2017  . Depression   . Fibromyalgia    constant pain  . Hx of cardiac catheterization    a. LHC in [place] "ok" per patient with mild plaque in a single vessel - records not available  . Hx of cardiovascular stress test    a. Nuclear study in 2008 normal  . Hx of colonic polyps   . Hypertension   .  Hypertriglyceridemia   . Impaired fasting glucose   . PONV (postoperative nausea and vomiting)   . PUD (peptic ulcer disease)    hx of gastric ulcer  . Pulmonary emboli ([place]) 06/2017  . Vitamin B12 deficiency     Past Surgical History:  Procedure Laterality Date  . ABDOMINAL HYSTERECTOMY    . CATARACT EXTRACTION  10/2003   OD  . CHOLECYSTECTOMY    . COLONOSCOPY W/ POLYPECTOMY    . COLONOSCOPY WITH PROPOFOL N/A 10/15/2014   Procedure: COLONOSCOPY WITH PROPOFOL;   Surgeon: Ladene Artist, MD;  Location: [facility] END[facility]COPY;  Service: Endoscopy;  Laterality: N/A;  . COLONOSCOPY WITH PROPOFOL N/A 03/23/2018   Procedure: COLONOSCOPY WITH PROPOFOL;  Surgeon: Gatha Mayer, MD;  Location: [facility] END[facility]COPY;  Service: Endoscopy;  Laterality: N/A;  . ESOPHAGOGASTRODUODENOSCOPY (EGD) WITH PROPOFOL N/A 10/15/2014   Procedure: ESOPHAGOGASTRODUODENOSCOPY (EGD) WITH PROPOFOL;  Surgeon: Ladene Artist, MD;  Location: [facility] END[facility]COPY;  Service: Endoscopy;  Laterality: N/A;  . EXTERNAL FIXATION ANKLE FRACTURE     Fx.  left ankle-fixation with pins later removed sec to infection 1985  . EXTERNAL FIXATION WRIST FRACTURE  1985   left with pins  . EYE SURGERY    . FEMUR FRACTURE SURGERY  06/2009  . FRACTURE SURGERY    . HARDWARE REMOVAL Left 10/05/2013   Procedure: HARDWARE REMOVAL LEFT DISTAL FEMUR;  Surgeon: Rozanna Box, MD;  Location: [place];  Service: Orthopedics;  Laterality: Left;  . HEMIARTHROPLASTY SHOULDER FRACTURE  06/2009  . HOT HEMOSTASIS N/A 03/23/2018   Procedure: HOT HEMOSTASIS (ARGON PLASMA COAGULATION/BICAP);  Surgeon: Gatha Mayer, MD;  Location: [facility] ENDOSCOPY;  Service: Endoscopy;  Laterality: N/A;  . JOINT REPLACEMENT    . STERIOD INJECTION Right 10/05/2013   Procedure: STEROID INJECTION;  Surgeon: Rozanna Box, MD;  Location: [place];  Service: Orthopedics;  Laterality: Right;  . TONSILLECTOMY    . TONSILLECTOMY    . TOTAL KNEE ARTHROPLASTY  03/09   left    Family History  Problem Relation Age of Onset  . Depression Mother   . Cancer Mother        uterine cancer  . Heart attack Father   . Cancer Brother        prostate cancer  . Diabetes Maternal Aunt   . Arthritis Brother   . Asthma Brother   . Stroke Maternal Grandmother   . Pulmonary embolism Daughter     Social History   Socioeconomic History  . Marital status: Widowed    Spouse name: Not on file  . Number of children: 4  . Years of education: Not on file  . Highest education level:  Not on file  Occupational History  . Occupation: retired crossing guard  . 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 Smoking status: Former Smoker    Packs/day: 1.00    Years: 40.00    Pack years: 40.00    Types: Cigarettes    Quit date: 07/06/1993    Years since quitting: 26.2  . Smokeless tobacco: Never Used  Substance and Sexual Activity  . Alcohol use: No    Alcohol/week: 0.0 standard drinks  . Drug use: No  . Sexual activity: Not on file  Other Topics Concern  . 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 Difficulty of Paying Living Expenses:   Food Insecurity:   . Worried About Charity fundraiser in the Last Year:   . Arboriculturist in the Last Year:   Transportation Needs:   . Film/video editor (Medical):   Marland Kitchen Lack of Transportation (Non-Medical):   Physical Activity:   . Days of Exercise per Week:   . Minutes of Exercise per Session:   Stress:   . Feeling of Stress :   Social Connections:   . Frequency of Communication with Friends and Family:   . Frequency of Social Gatherings with Friends and Family:   . Attends Religious Services:   . Active Member of Clubs or Organizations:   . Attends Archivist Meetings:   Marland Kitchen Marital Status:   Intimate Partner Violence:   . Fear of Current or Ex-Partner:   . Emotionally Abused:   Marland Kitchen Physically Abused:   . Sexually Abused:    Review of Systems Sleeping too much. Initiates 2AM--tries to get OOB before 11AM. Sleeps in electric recliner Appetite is fine Weight is stable. BMI still over 50 Wears seat belt Full  dentures--no problems Chronic bruising all over--relates to eliquis. No suspicious skin lesions No heartburn or dysphagia No urinary problems Bowels okay--no blood Chronic joint pains     Objective:   Physical Exam  Constitutional: She is oriented to person, place, and time. No distress.  HENT:  Mouth/Throat: Oropharynx is clear and moist. No oropharyngeal exudate.  Neck: No thyromegaly present.  Cardiovascular: Normal rate and normal heart sounds.  No murmur heard. Faint pedal pulses  Respiratory: Effort normal. No respiratory distress. She has no wheezes. She has no rales.  Decreased breath sounds but clear  GI: Soft. There is no abdominal tenderness.  Musculoskeletal:     Comments: Nodules in all DIP in fingers Whitish in left 2nd but not really tender  1+ reedy swelling in ankles/feet/lower calves  Lymphadenopathy:    She has no cervical adenopathy.  Neurological: She is alert and oriented to person, place, and time.  President--- "Edmon Crape, Trump, Obama" [phone] [id number] Recall 3/3 (2/3 from last year)  Skin:  No ulcers  Psychiatric: She has a normal mood and affect. Her behavior is normal.           Assessment & Plan:

## 2019-09-18 NOTE — Assessment & Plan Note (Signed)
Compensated Discussed the furosemide--needs regularly

## 2019-09-18 NOTE — Assessment & Plan Note (Signed)
Dysthymia  Some loneliness No Rx indicated

## 2019-09-18 NOTE — Assessment & Plan Note (Signed)
I have personally reviewed the Medicare Annual Wellness questionnaire and have noted 1. The patient's medical and social history 2. Their use of alcohol, tobacco or illicit drugs 3. Their current medications and supplements 4. The patient's functional ability including ADL's, fall risks, home safety risks and hearing or visual             impairment. 5. Diet and physical activities 6. Evidence for depression or mood disorders  The patients weight, height, BMI and visual acuity have been recorded in the chart I have made referrals, counseling and provided education to the patient based review of the above and I have provided the pt with a written personalized care plan for preventive services.  I have provided you with a copy of your personalized plan for preventive services. Please take the time to review along with your updated medication list.  Refuses any vaccines No cancer screening due to age Discussed the need for regular home exercise program

## 2019-09-18 NOTE — Assessment & Plan Note (Signed)
Discussed healthy eating Not really able to exercise

## 2019-09-18 NOTE — Assessment & Plan Note (Signed)
Will recheck levels

## 2019-09-18 NOTE — Progress Notes (Signed)
Hearing Screening   Method: Audiometry   125Hz  250Hz  500Hz  1000Hz  2000Hz  3000Hz  4000Hz  6000Hz  8000Hz   Right ear:   20 20 20  20     Left ear:   20 20 20  20     Vision Screening Comments: Appt in May 2021

## 2019-09-18 NOTE — Assessment & Plan Note (Signed)
See social history 

## 2019-09-18 NOTE — Assessment & Plan Note (Signed)
I don't think her fingers are from this Will check uric acid Consider prn colchicine again

## 2019-09-19 LAB — COMPREHENSIVE METABOLIC PANEL
ALT: 11 U/L (ref 0–35)
AST: 21 U/L (ref 0–37)
Albumin: 3.4 g/dL — ABNORMAL LOW (ref 3.5–5.2)
Alkaline Phosphatase: 89 U/L (ref 39–117)
BUN: 20 mg/dL (ref 6–23)
CO2: 30 mEq/L (ref 19–32)
Calcium: 8.8 mg/dL (ref 8.4–10.5)
Chloride: 99 mEq/L (ref 96–112)
Creatinine, Ser: 1.05 mg/dL (ref 0.40–1.20)
GFR: 50.06 mL/min — ABNORMAL LOW (ref >60.00)
Glucose, Bld: 104 mg/dL — ABNORMAL HIGH (ref 70–99)
Potassium: 3.9 mEq/L (ref 3.5–5.1)
Sodium: 139 mEq/L (ref 135–145)
Total Bilirubin: 0.7 mg/dL (ref 0.2–1.2)
Total Protein: 5.4 g/dL — ABNORMAL LOW (ref 6.0–8.3)

## 2019-09-19 LAB — CBC
HCT: 28.3 % — ABNORMAL LOW (ref 36.0–46.0)
Hemoglobin: 9 g/dL — ABNORMAL LOW (ref 12.0–15.0)
MCHC: 31.7 g/dL (ref 30.0–36.0)
MCV: 82.2 fl (ref 78.0–100.0)
Platelets: 333 10*3/uL (ref 150.0–400.0)
RBC: 3.44 Mil/uL — ABNORMAL LOW (ref 3.87–5.11)
RDW: 19.5 % — ABNORMAL HIGH (ref 11.5–15.5)
WBC: 8.1 10*3/uL (ref 4.0–10.5)

## 2019-09-19 LAB — VITAMIN B12: Vitamin B-12: 238 pg/mL (ref 211–911)

## 2019-09-19 LAB — URIC ACID: Uric Acid, Serum: 11.8 mg/dL — ABNORMAL HIGH (ref 2.4–7.0)

## 2019-09-19 LAB — T4, FREE: Free T4: 0.57 ng/dL — ABNORMAL LOW (ref 0.60–1.60)

## 2019-09-20 DIAGNOSIS — I2699 Other pulmonary embolism without acute cor pulmonale: Secondary | ICD-10-CM | POA: Diagnosis not present

## 2019-09-20 DIAGNOSIS — M160 Bilateral primary osteoarthritis of hip: Secondary | ICD-10-CM | POA: Diagnosis not present

## 2019-09-20 DIAGNOSIS — I0981 Rheumatic heart failure: Secondary | ICD-10-CM | POA: Diagnosis not present

## 2019-09-20 DIAGNOSIS — E538 Deficiency of other specified B group vitamins: Secondary | ICD-10-CM | POA: Diagnosis not present

## 2019-09-20 DIAGNOSIS — M797 Fibromyalgia: Secondary | ICD-10-CM | POA: Diagnosis not present

## 2019-09-20 DIAGNOSIS — D509 Iron deficiency anemia, unspecified: Secondary | ICD-10-CM | POA: Diagnosis not present

## 2019-09-20 DIAGNOSIS — D631 Anemia in chronic kidney disease: Secondary | ICD-10-CM | POA: Diagnosis not present

## 2019-09-20 DIAGNOSIS — I700 Atherosclerosis of aorta: Secondary | ICD-10-CM | POA: Diagnosis not present

## 2019-09-20 DIAGNOSIS — I5032 Chronic diastolic (congestive) heart failure: Secondary | ICD-10-CM | POA: Diagnosis not present

## 2019-09-20 DIAGNOSIS — I130 Hypertensive heart and chronic kidney disease with heart failure and stage 1 through stage 4 chronic kidney disease, or unspecified chronic kidney disease: Secondary | ICD-10-CM | POA: Diagnosis not present

## 2019-09-20 DIAGNOSIS — Z6841 Body Mass Index (BMI) 40.0 and over, adult: Secondary | ICD-10-CM | POA: Diagnosis not present

## 2019-09-20 DIAGNOSIS — N183 Chronic kidney disease, stage 3 unspecified: Secondary | ICD-10-CM | POA: Diagnosis not present

## 2019-09-20 DIAGNOSIS — M5136 Other intervertebral disc degeneration, lumbar region: Secondary | ICD-10-CM | POA: Diagnosis not present

## 2019-09-20 DIAGNOSIS — F329 Major depressive disorder, single episode, unspecified: Secondary | ICD-10-CM | POA: Diagnosis not present

## 2019-09-20 DIAGNOSIS — Z9181 History of falling: Secondary | ICD-10-CM | POA: Diagnosis not present

## 2019-09-20 DIAGNOSIS — Z87891 Personal history of nicotine dependence: Secondary | ICD-10-CM | POA: Diagnosis not present

## 2019-09-20 DIAGNOSIS — I081 Rheumatic disorders of both mitral and tricuspid valves: Secondary | ICD-10-CM | POA: Diagnosis not present

## 2019-09-20 DIAGNOSIS — I872 Venous insufficiency (chronic) (peripheral): Secondary | ICD-10-CM | POA: Diagnosis not present

## 2019-09-20 DIAGNOSIS — F419 Anxiety disorder, unspecified: Secondary | ICD-10-CM | POA: Diagnosis not present

## 2019-09-20 DIAGNOSIS — Z7901 Long term (current) use of anticoagulants: Secondary | ICD-10-CM | POA: Diagnosis not present

## 2019-10-03 DIAGNOSIS — I872 Venous insufficiency (chronic) (peripheral): Secondary | ICD-10-CM

## 2019-10-03 DIAGNOSIS — M5136 Other intervertebral disc degeneration, lumbar region: Secondary | ICD-10-CM

## 2019-10-03 DIAGNOSIS — F329 Major depressive disorder, single episode, unspecified: Secondary | ICD-10-CM

## 2019-10-03 DIAGNOSIS — D631 Anemia in chronic kidney disease: Secondary | ICD-10-CM

## 2019-10-03 DIAGNOSIS — Z6841 Body Mass Index (BMI) 40.0 and over, adult: Secondary | ICD-10-CM

## 2019-10-03 DIAGNOSIS — D509 Iron deficiency anemia, unspecified: Secondary | ICD-10-CM | POA: Diagnosis not present

## 2019-10-03 DIAGNOSIS — I130 Hypertensive heart and chronic kidney disease with heart failure and stage 1 through stage 4 chronic kidney disease, or unspecified chronic kidney disease: Secondary | ICD-10-CM | POA: Diagnosis not present

## 2019-10-03 DIAGNOSIS — E538 Deficiency of other specified B group vitamins: Secondary | ICD-10-CM

## 2019-10-03 DIAGNOSIS — I700 Atherosclerosis of aorta: Secondary | ICD-10-CM

## 2019-10-03 DIAGNOSIS — I0981 Rheumatic heart failure: Secondary | ICD-10-CM | POA: Diagnosis not present

## 2019-10-03 DIAGNOSIS — F419 Anxiety disorder, unspecified: Secondary | ICD-10-CM

## 2019-10-03 DIAGNOSIS — M160 Bilateral primary osteoarthritis of hip: Secondary | ICD-10-CM

## 2019-10-03 DIAGNOSIS — I5032 Chronic diastolic (congestive) heart failure: Secondary | ICD-10-CM

## 2019-10-03 DIAGNOSIS — Z9181 History of falling: Secondary | ICD-10-CM

## 2019-10-03 DIAGNOSIS — I2699 Other pulmonary embolism without acute cor pulmonale: Secondary | ICD-10-CM | POA: Diagnosis not present

## 2019-10-03 DIAGNOSIS — Z7901 Long term (current) use of anticoagulants: Secondary | ICD-10-CM

## 2019-10-03 DIAGNOSIS — N183 Chronic kidney disease, stage 3 unspecified: Secondary | ICD-10-CM

## 2019-10-03 DIAGNOSIS — Z87891 Personal history of nicotine dependence: Secondary | ICD-10-CM

## 2019-10-03 DIAGNOSIS — I081 Rheumatic disorders of both mitral and tricuspid valves: Secondary | ICD-10-CM

## 2019-10-03 DIAGNOSIS — M797 Fibromyalgia: Secondary | ICD-10-CM

## 2019-10-09 ENCOUNTER — Encounter: Payer: Self-pay | Admitting: Hematology and Oncology

## 2019-10-10 ENCOUNTER — Other Ambulatory Visit: Payer: Self-pay

## 2019-10-10 ENCOUNTER — Encounter: Payer: Self-pay | Admitting: *Deleted

## 2019-10-10 ENCOUNTER — Other Ambulatory Visit: Payer: Self-pay | Admitting: *Deleted

## 2019-10-10 ENCOUNTER — Telehealth: Payer: Self-pay | Admitting: *Deleted

## 2019-10-10 ENCOUNTER — Other Ambulatory Visit: Payer: Self-pay | Admitting: Hematology and Oncology

## 2019-10-10 ENCOUNTER — Inpatient Hospital Stay: Payer: Medicare Other | Attending: Hematology and Oncology

## 2019-10-10 DIAGNOSIS — Z7901 Long term (current) use of anticoagulants: Secondary | ICD-10-CM | POA: Insufficient documentation

## 2019-10-10 DIAGNOSIS — R197 Diarrhea, unspecified: Secondary | ICD-10-CM | POA: Diagnosis not present

## 2019-10-10 DIAGNOSIS — Z79899 Other long term (current) drug therapy: Secondary | ICD-10-CM | POA: Diagnosis not present

## 2019-10-10 DIAGNOSIS — D500 Iron deficiency anemia secondary to blood loss (chronic): Secondary | ICD-10-CM

## 2019-10-10 DIAGNOSIS — K922 Gastrointestinal hemorrhage, unspecified: Secondary | ICD-10-CM | POA: Diagnosis not present

## 2019-10-10 DIAGNOSIS — K648 Other hemorrhoids: Secondary | ICD-10-CM | POA: Insufficient documentation

## 2019-10-10 DIAGNOSIS — R5383 Other fatigue: Secondary | ICD-10-CM | POA: Insufficient documentation

## 2019-10-10 LAB — CBC WITH DIFFERENTIAL (CANCER CENTER ONLY)
Abs Immature Granulocytes: 0.03 10*3/uL (ref 0.00–0.07)
Basophils Absolute: 0.1 10*3/uL (ref 0.0–0.1)
Basophils Relative: 1 %
Eosinophils Absolute: 1 10*3/uL — ABNORMAL HIGH (ref 0.0–0.5)
Eosinophils Relative: 14 %
HCT: 23.6 % — ABNORMAL LOW (ref 36.0–46.0)
Hemoglobin: 6.8 g/dL — CL (ref 12.0–15.0)
Immature Granulocytes: 0 %
Lymphocytes Relative: 14 %
Lymphs Abs: 1 10*3/uL (ref 0.7–4.0)
MCH: 23.7 pg — ABNORMAL LOW (ref 26.0–34.0)
MCHC: 28.8 g/dL — ABNORMAL LOW (ref 30.0–36.0)
MCV: 82.2 fL (ref 80.0–100.0)
Monocytes Absolute: 0.9 10*3/uL (ref 0.1–1.0)
Monocytes Relative: 12 %
Neutro Abs: 4.3 10*3/uL (ref 1.7–7.7)
Neutrophils Relative %: 59 %
Platelet Count: 263 10*3/uL (ref 150–400)
RBC: 2.87 MIL/uL — ABNORMAL LOW (ref 3.87–5.11)
RDW: 18 % — ABNORMAL HIGH (ref 11.5–15.5)
WBC Count: 7.3 10*3/uL (ref 4.0–10.5)
nRBC: 0.3 % — ABNORMAL HIGH (ref 0.0–0.2)

## 2019-10-10 NOTE — Telephone Encounter (Signed)
RN placed call to pt daughter [name] to inform her of blood transfusion apt that has been scheduled for 10/11/19.  Pt daughter states pt has been very symptomatic and is experiencing increase fatigue as well as increased "weeping from her sides".  States pt has to use paper towels under her arms daily to help control the weeping.  Requesting pt be examined by MD while in infusion. Informed daughter that pt will be receiving 2 units of blood in [facility] and Sandi Mealy, PA will be able to assess pt.  Daughter requesting a call from NP or RN tomorrow for an update on her mother [phone]).

## 2019-10-10 NOTE — Progress Notes (Signed)
CRITICAL VALUE ALERT  Critical Value:  Hgb 6.8   Date & Time Notied:  10/10/19 at 1:30 pm  Provider Notified: Nicholas Lose, MD  Orders Received/Actions taken: Orders received for pt to receive 2 units of PRBC's

## 2019-10-11 ENCOUNTER — Inpatient Hospital Stay: Payer: Medicare Other

## 2019-10-11 ENCOUNTER — Inpatient Hospital Stay (HOSPITAL_BASED_OUTPATIENT_CLINIC_OR_DEPARTMENT_OTHER): Payer: Medicare Other | Admitting: Medical

## 2019-10-11 ENCOUNTER — Other Ambulatory Visit: Payer: Self-pay

## 2019-10-11 ENCOUNTER — Other Ambulatory Visit: Payer: Self-pay | Admitting: Emergency Medicine

## 2019-10-11 DIAGNOSIS — K922 Gastrointestinal hemorrhage, unspecified: Secondary | ICD-10-CM | POA: Diagnosis not present

## 2019-10-11 DIAGNOSIS — D500 Iron deficiency anemia secondary to blood loss (chronic): Secondary | ICD-10-CM | POA: Diagnosis not present

## 2019-10-11 DIAGNOSIS — R197 Diarrhea, unspecified: Secondary | ICD-10-CM | POA: Diagnosis not present

## 2019-10-11 DIAGNOSIS — R5383 Other fatigue: Secondary | ICD-10-CM | POA: Diagnosis not present

## 2019-10-11 DIAGNOSIS — Z7901 Long term (current) use of anticoagulants: Secondary | ICD-10-CM | POA: Diagnosis not present

## 2019-10-11 DIAGNOSIS — I890 Lymphedema, not elsewhere classified: Secondary | ICD-10-CM

## 2019-10-11 DIAGNOSIS — K648 Other hemorrhoids: Secondary | ICD-10-CM | POA: Diagnosis not present

## 2019-10-11 DIAGNOSIS — D649 Anemia, unspecified: Secondary | ICD-10-CM

## 2019-10-11 LAB — SAMPLE TO BLOOD BANK

## 2019-10-11 LAB — PREPARE RBC (CROSSMATCH)

## 2019-10-11 MED ORDER — DIPHENHYDRAMINE HCL 25 MG PO CAPS
25.0000 mg | ORAL_CAPSULE | Freq: Once | ORAL | Status: AC
  Administered 2019-10-11: 25 mg via ORAL

## 2019-10-11 MED ORDER — ACETAMINOPHEN 325 MG PO TABS: 650.0000 mg | ORAL_TABLET | Freq: Once | ORAL | Status: DC

## 2019-10-11 MED ORDER — ACETAMINOPHEN 325 MG PO TABS
ORAL_TABLET | ORAL | Status: AC
  Filled 2019-10-11: qty 2

## 2019-10-11 MED ORDER — SODIUM CHLORIDE 0.9% IV SOLUTION
250.0000 mL | Freq: Once | INTRAVENOUS | Status: AC
  Administered 2019-10-11: 250 mL via INTRAVENOUS
  Filled 2019-10-11: qty 250

## 2019-10-11 MED ORDER — DIPHENHYDRAMINE HCL 25 MG PO CAPS
ORAL_CAPSULE | ORAL | Status: AC
  Filled 2019-10-11: qty 1

## 2019-10-11 NOTE — Patient Instructions (Addendum)
Blood Transfusion, Adult, Care After This sheet gives you information about how to care for yourself after your procedure. Your doctor may also give you more specific instructions. If you have problems or questions, contact your doctor. What can I expect after the procedure? After the procedure, it is common to have:  Bruising and soreness at the IV site.  A fever or chills on the day of the procedure. This may be your body's response to the new blood cells received.  A headache. Follow these instructions at home: Insertion site care      Follow instructions from your doctor about how to take care of your insertion site. This is where an IV tube was put into your vein. Make sure you: ? Wash your hands with soap and water before and after you change your bandage (dressing). If you cannot use soap and water, use hand sanitizer. ? Change your bandage as told by your doctor.  Check your insertion site every day for signs of infection. Check for: ? Redness, swelling, or pain. ? Bleeding from the site. ? Warmth. ? Pus or a bad smell. General instructions  Take over-the-counter and prescription medicines only as told by your doctor.  Rest as told by your doctor.  Go back to your normal activities as told by your doctor.  Keep all follow-up visits as told by your doctor. This is important. Contact a doctor if:  You have itching or red, swollen areas of skin (hives).  You feel worried or nervous (anxious).  You feel weak after doing your normal activities.  You have redness, swelling, warmth, or pain around the insertion site.  You have blood coming from the insertion site, and the blood does not stop with pressure.  You have pus or a bad smell coming from the insertion site. Get help right away if:  You have signs of a serious reaction. This may be coming from an allergy or the body's defense system (immune system). Signs include: ? Trouble breathing or shortness of  breath. ? Swelling of the face or feeling warm (flushed). ? Fever or chills. ? Head, chest, or back pain. ? Dark pee (urine) or blood in the pee. ? Widespread rash. ? Fast heartbeat. ? Feeling dizzy or light-headed. You may receive your blood transfusion in an outpatient setting. If so, you will be told whom to contact to report any reactions. These symptoms may be an emergency. Do not wait to see if the symptoms will go away. Get medical help right away. Call your local emergency services (911 in the [place] Do not drive yourself to the hospital. Summary  Bruising and soreness at the IV site are common.  Check your insertion site every day for signs of infection.  Rest as told by your doctor. Go back to your normal activities as told by your doctor.  Get help right away if you have signs of a serious reaction. This information is not intended to replace advice given to you by your health care provider. Make sure you discuss any questions you have with your health care provider. Document Revised: 12/15/2018 Document Reviewed: 12/15/2018 Elsevier Patient Education  2020 Elsevier Inc.     Blood Transfusion, Adult A blood transfusion is a procedure in which you receive blood or a type of blood cell (blood component) through an IV. You may need a blood transfusion when your blood level is low. This may result from a bleeding disorder, illness, injury, or surgery. The blood may come   from a donor. You may also be able to donate blood for yourself (autologous blood donation) before a planned surgery. The blood given in a transfusion is made up of different blood components. You may receive:  Red blood cells. These carry oxygen to the cells in the body.  Platelets. These help your blood to clot.  Plasma. This is the liquid part of your blood. It carries proteins and other substances throughout the body.  White blood cells. These help you fight infections. If you have hemophilia or  another clotting disorder, you may also receive other types of blood products. Tell a health care provider about:  Any blood disorders you have.  Any previous reactions you have had during a blood transfusion.  Any allergies you have.  All medicines you are taking, including vitamins, herbs, eye drops, creams, and over-the-counter medicines.  Any surgeries you have had.  Any medical conditions you have, including any recent fever or cold symptoms.  Whether you are pregnant or may be pregnant. What are the risks? Generally, this is a safe procedure. However, problems may occur.  The most common problems include: ? A mild allergic reaction, such as red, swollen areas of skin (hives) and itching. ? Fever or chills. This may be the body's response to new blood cells received. This may occur during or up to 4 hours after the transfusion.  More serious problems may include: ? Transfusion-associated circulatory overload (TACO), or too much fluid in the lungs. This may cause breathing problems. ? A serious allergic reaction, such as difficulty breathing or swelling around the face and lips. ? Transfusion-related acute lung injury (TRALI), which causes breathing difficulty and low oxygen in the blood. This can occur within hours of the transfusion or several days later. ? Iron overload. This can happen after receiving many blood transfusions over a period of time. ? Infection or virus being transmitted. This is rare because donated blood is carefully tested before it is given. ? Hemolytic transfusion reaction. This is rare. It happens when your body's defense system (immune system)tries to attack the new blood cells. Symptoms may include fever, chills, nausea, low blood pressure, and low back or chest pain. ? Transfusion-associated graft-versus-host disease (TAGVHD). This is rare. It happens when donated cells attack your body's healthy tissues. What happens before the  procedure? Medicines Ask your health care provider about:  Changing or stopping your regular medicines. This is especially important if you are taking diabetes medicines or blood thinners.  Taking medicines such as aspirin and ibuprofen. These medicines can thin your blood. Do not take these medicines unless your health care provider tells you to take them.  Taking over-the-counter medicines, vitamins, herbs, and supplements. General instructions  Follow instructions from your health care provider about eating and drinking restrictions.  You will have a blood test to determine your blood type. This is necessary to know what kind of blood your body will accept and to match it to the donor blood.  If you are going to have a planned surgery, you may be able to do an autologous blood donation. This may be done in case you need to have a transfusion.  You will have your temperature, blood pressure, and pulse monitored before the transfusion.  If you have had an allergic reaction to a transfusion in the past, you may be given medicine to help prevent a reaction. This medicine may be given to you by mouth (orally) or through an IV.  Set aside time for   the blood transfusion. This procedure generally takes 1-4 hours to complete. What happens during the procedure?   An IV will be inserted into one of your veins.  The bag of donated blood will be attached to your IV. The blood will then enter through your vein.  Your temperature, blood pressure, and pulse will be monitored regularly during the transfusion. This monitoring is done to detect early signs of a transfusion reaction.  Tell your nurse right away if you have any of these symptoms during the transfusion: ? Shortness of breath or trouble breathing. ? Chest or back pain. ? Fever or chills. ? Hives or itching.  If you have any signs or symptoms of a reaction, your transfusion will be stopped and you may be given medicine.  When the  transfusion is complete, your IV will be removed.  Pressure may be applied to the IV site for a few minutes.  A bandage (dressing)will be applied. The procedure may vary among health care providers and hospitals. What happens after the procedure?  Your temperature, blood pressure, pulse, breathing rate, and blood oxygen level will be monitored until you leave the hospital or clinic.  Your blood may be tested to see how you are responding to the transfusion.  You may be warmed with fluids or blankets to maintain a normal body temperature.  If you receive your blood transfusion in an outpatient setting, you will be told whom to contact to report any reactions. Where to find more information For more information on blood transfusions, visit the American Red Cross: redcross.org Summary  A blood transfusion is a procedure in which you receive blood or a type of blood cell (blood component) through an IV.  The blood you receive may come from a donor or be donated by yourself (autologous blood donation) before a planned surgery.  The blood given in a transfusion is made up of different blood components. You may receive red blood cells, platelets, plasma, or white blood cells depending on the condition treated.  Your temperature, blood pressure, and pulse will be monitored before, during, and after the transfusion.  After the transfusion, your blood may be tested to see how your body has responded. This information is not intended to replace advice given to you by your health care provider. Make sure you discuss any questions you have with your health care provider. Document Revised: 12/15/2018 Document Reviewed: 12/15/2018 Elsevier Patient Education  2020 Elsevier Inc.   

## 2019-10-11 NOTE — Progress Notes (Signed)
Pt received 2 units PRBCs today, tolerated well.  Able to eat/drink/ambulate to restroom during tx w/out any issues.  VSS.  Denies any questions/concerns at time of d/c.  Daughter driving pt home.  Pt and daughter aware to take dose of oral lasix at home this evening if pt begins feeling more SOB or 'full' than normal (hx kidney disease & fluid retention, pt declined IV lasix during or at end of tx today d/t mobility issues), verbalized understanding.

## 2019-10-11 NOTE — Progress Notes (Signed)
  HEMATOLOGY-ONCOLOGY TELEPHONE VISIT PROGRESS NOTE  I connected with Chelsea Keller on 10/12/2019 at  1:45 PM EDT by telephone and verified that I am speaking with the correct person using two identifiers.  I discussed the limitations, risks, security and privacy concerns of performing Chelsea evaluation and management service by telephone and the availability of in person appointments.  I also discussed with the patient that there may be a patient responsible charge related to this service. The patient expressed understanding and agreed to proceed.   History of Present Illness: Chelsea Keller is a 85 y.o. female with above-mentioned history of iron deficiency anemiapreviously treated with IV iron and bilateral PEs currently on Eliquis. Labs on 10/10/19 showed Hg 6.8, HCT 23.6, MCV 82.2. She presents over the phone today to review her labs.  Observations/Objective:    Assessment Plan:  Iron deficiency anemia due to chronic blood loss Hospitalization 08/11/2018: Severe GI bleed hemoglobin 4.9(internal hemorrhoids and angiodysplasia of the colon)Received blood transfusions IV iron treatment: September 2019, March 2020, Injectafer June 2020, Venofer January 2021   Toxicities of iron infusion:Patient gets profound diarrhea which last for 2 to 3 days afterFeraheme (and prescription for Lomotil for 3 days).  With Venofer she did okay but she still had profound diarrhea as well as lethargic  10/10/2019: Hemoglobin 6.8: MCV 82, RDW 18 2 units of PRBC given on 10/11/2019.  We will need to monitor her hemoglobin more closely. Return to clinic in 2 months with labs and follow-up  I discussed the assessment and treatment plan with the patient. The patient was provided Chelsea opportunity to ask questions and all were answered. The patient agreed with the plan and demonstrated Chelsea understanding of the instructions. The patient was advised to call back or seek Chelsea in-person evaluation if the symptoms worsen or if the  condition fails to improve as anticipated.   I provided 12 minutes of non-face-to-face time during this encounter.   Rulon Eisenmenger, MD 10/12/2019    I, Molly Dorshimer, am acting as scribe for Nicholas Lose, MD.  I have reviewed the above documentation for accuracy and completeness, and I agree with the above.

## 2019-10-11 NOTE — Progress Notes (Signed)
The patient was seen while she was receiving 2 units of PRBC's today. She reports weeping from skin lesions on her bilateral lateral buttocks. She has a history lower extremity edema and is on 80 mg of Lasix daily. She reports that her PCP whom she will see tomorrow wants her to take Lasix at 80 mg twice daily. She states that she has difficulty getting up to the bathroom and has remained on Lasix 80 mg once daily. Her exam shows that she has weeping of serous drainage from skin excoriations on her lateral hips which is likely secondary to lymphedema. She is scheduled to see Dr. Lindi Adie tomorrow.  Sandi Mealy, MHS, PA-C Physician Assistant

## 2019-10-12 ENCOUNTER — Ambulatory Visit (INDEPENDENT_AMBULATORY_CARE_PROVIDER_SITE_OTHER): Payer: Medicare Other | Admitting: Internal Medicine

## 2019-10-12 ENCOUNTER — Encounter: Payer: Self-pay | Admitting: Internal Medicine

## 2019-10-12 ENCOUNTER — Inpatient Hospital Stay (HOSPITAL_BASED_OUTPATIENT_CLINIC_OR_DEPARTMENT_OTHER): Payer: Medicare Other | Admitting: Hematology and Oncology

## 2019-10-12 DIAGNOSIS — D500 Iron deficiency anemia secondary to blood loss (chronic): Secondary | ICD-10-CM

## 2019-10-12 DIAGNOSIS — I872 Venous insufficiency (chronic) (peripheral): Secondary | ICD-10-CM

## 2019-10-12 LAB — BPAM RBC
Blood Product Expiration Date: 202104262359
Blood Product Expiration Date: 202105032359
ISSUE DATE / TIME: 202104071034
ISSUE DATE / TIME: 202104071034
Unit Type and Rh: 7300
Unit Type and Rh: 7300

## 2019-10-12 LAB — TYPE AND SCREEN
ABO/RH(D): B POS
Antibody Screen: NEGATIVE
Unit division: 0
Unit division: 0

## 2019-10-12 MED ORDER — FUROSEMIDE 80 MG PO TABS: 80.0000 mg | ORAL_TABLET | Freq: Every day | ORAL | 11 refills | Status: DC

## 2019-10-12 MED ORDER — APIXABAN 5 MG PO TABS: 5.0000 mg | ORAL_TABLET | Freq: Two times a day (BID) | ORAL | 3 refills | Status: DC

## 2019-10-12 MED ORDER — HYDROXYZINE HCL 10 MG PO TABS: 10.0000 mg | ORAL_TABLET | Freq: Three times a day (TID) | ORAL | 0 refills | Status: DC | PRN

## 2019-10-12 NOTE — Assessment & Plan Note (Signed)
Hospitalization 08/11/2018: Severe GI bleed hemoglobin 4.9(internal hemorrhoids and angiodysplasia of the colon)Received blood transfusions IV iron treatment: September 2019, March 2020, Injectafer June 2020, Venofer January 2021   Toxicities of iron infusion:Patient gets profound diarrhea which last for 2 to 3 days afterFeraheme (and prescription for Lomotil for 3 days). With Injectafer, she did much better  10/10/2019: Hemoglobin 6.8: MCV 82, RDW 18 2 units of PRBC given on 10/11/2019.  We will need to monitor her hemoglobin more closely. Return to clinic in 2 months with labs and follow-up

## 2019-10-12 NOTE — Assessment & Plan Note (Signed)
Has some fluid accumulation along flanks as well---though not severe Bigger issue is excoriations (hydroxyzine minimally effective) Will renew the furosemide Likely worsening can be from transfusion x 2 yesterday with increased weight  If furosemide 80 doesn't help---will consider making it bid or adding second agent (?aldactone)

## 2019-10-12 NOTE — Progress Notes (Signed)
Subjective:    Patient ID: Chelsea Keller, female    DOB: 01/16/1935, 85 y.o.   MRN: [id number]  HPI Here with daughter--due to weeping along flanks Goes back about 3 weeks This visit occurred during the SARS-CoV-2 public health emergency.  Safety protocols were in place, including screening questions prior to the visit, additional usage of staff PPE, and extensive cleaning of exam room while observing appropriate contact time as indicated for disinfecting solutions.   Not every day No obvious swelling along her flanks Furosemide not doing much--but is outdated  Weight up since getting 2 units of blood yesterday No increase in leg swelling--just problems with itching  Current Outpatient Medications on File Prior to Visit  Medication Sig Dispense Refill  . acetaminophen (TYLENOL) 500 MG tablet Take 1,000-1,500 mg by mouth every 6 (six) hours as needed for moderate pain or headache (pain).     Marland Kitchen apixaban (ELIQUIS) 5 MG TABS tablet Take 1 tablet (5 mg total) by mouth 2 (two) times daily. 180 tablet 3  . furosemide (LASIX) 80 MG tablet Take 80 mg by mouth daily. Pt is cutting in half    . gabapentin (NEURONTIN) 300 MG capsule Take 1 capsule in the morning and 2 capsules at bedtime 270 capsule 3  . hydrOXYzine (ATARAX/VISTARIL) 10 MG tablet Take 10 mg by mouth 3 (three) times daily as needed.    . nystatin (NYSTATIN) powder Apply 1 application topically 3 (three) times daily. 60 g 1  . potassium chloride SA (K-DUR,KLOR-CON) 20 MEQ tablet TAKE 2 TABLETS (40 MEQ TOTAL) DAILY (Patient taking differently: Take 40 mEq by mouth daily. ) 180 tablet 4  . triamterene-hydrochlorothiazide (MAXZIDE-25) 37.5-25 MG tablet Take 1 tablet by mouth daily. 90 tablet 3   No current facility-administered medications on file prior to visit.    Allergies  Allergen Reactions  . Codeine Sulfate Shortness Of Breath  . Celecoxib Other (See Comments)    Caused vaginal bleeding  . Erythromycin Base Nausea And  Vomiting    Past Medical History:  Diagnosis Date  . Allergy   . Anemia   . Anxiety   . Arthritis   . Arthritis of sacroiliac joint of both sides 11/12/2017  . Bilateral pulmonary embolism ([place]) 06/23/2017  . Chronic diastolic CHF (congestive heart failure) (HCC)    a. Echo 1/16:  mild LVH, EF normal, grade 1 DD, MAC  . Chronic venous insufficiency    chronic LE edema  . DDD (degenerative disc disease), lumbar 11/12/2017  . Degenerative joint disease (DJD) of hip, Bilateral  11/12/2017  . Depression   . Fibromyalgia    constant pain  . Hx of cardiac catheterization    a. LHC in [place] "ok" per patient with mild plaque in a single vessel - records not available  . Hx of cardiovascular stress test    a. Nuclear study in 2008 normal  . Hx of colonic polyps   . Hypertension   . Hypertriglyceridemia   . Impaired fasting glucose   . PONV (postoperative nausea and vomiting)   . PUD (peptic ulcer disease)    hx of gastric ulcer  . Pulmonary emboli ([place]) 06/2017  . Vitamin B12 deficiency     Past Surgical History:  Procedure Laterality Date  . ABDOMINAL HYSTERECTOMY    . CATARACT EXTRACTION  10/2003   OD  . CHOLECYSTECTOMY    . COLONOSCOPY W/ POLYPECTOMY    . COLONOSCOPY WITH PROPOFOL N/A 10/15/2014   Procedure: COLONOSCOPY WITH PROPOFOL;  Surgeon: Ladene Artist, MD;  Location: [facility] ENDOSCOPY;  Service: Endoscopy;  Laterality: N/A;  . COLONOSCOPY WITH PROPOFOL N/A 03/23/2018   Procedure: COLONOSCOPY WITH PROPOFOL;  Surgeon: Gatha Mayer, MD;  Location: [facility] ENDOSCOPY;  Service: Endoscopy;  Laterality: N/A;  . ESOPHAGOGASTRODUODENOSCOPY (EGD) WITH PROPOFOL N/A 10/15/2014   Procedure: ESOPHAGOGASTRODUODENOSCOPY (EGD) WITH PROPOFOL;  Surgeon: Ladene Artist, MD;  Location: [facility] ENDOSCOPY;  Service: Endoscopy;  Laterality: N/A;  . EXTERNAL FIXATION ANKLE FRACTURE     Fx.  left ankle-fixation with pins later removed sec to infection 1985  . EXTERNAL FIXATION WRIST FRACTURE  1985   left  with pins  . EYE SURGERY    . FEMUR FRACTURE SURGERY  06/2009  . FRACTURE SURGERY    . HARDWARE REMOVAL Left 10/05/2013   Procedure: HARDWARE REMOVAL LEFT DISTAL FEMUR;  Surgeon: Rozanna Box, MD;  Location: [facility];  Service: Orthopedics;  Laterality: Left;  . HEMIARTHROPLASTY SHOULDER FRACTURE  06/2009  . HOT HEMOSTASIS N/A 03/23/2018   Procedure: HOT HEMOSTASIS (ARGON PLASMA COAGULATION/BICAP);  Surgeon: Gatha Mayer, MD;  Location: [facility] ENDOSCOPY;  Service: Endoscopy;  Laterality: N/A;  . JOINT REPLACEMENT    . STERIOD INJECTION Right 10/05/2013   Procedure: STEROID INJECTION;  Surgeon: Rozanna Box, MD;  Location: [facility];  Service: Orthopedics;  Laterality: Right;  . TONSILLECTOMY    . TONSILLECTOMY    . TOTAL KNEE ARTHROPLASTY  03/09   left    Family History  Problem Relation Age of Onset  . Depression Mother   . Cancer Mother        uterine cancer  . Heart attack Father   . Cancer Brother        prostate cancer  . Diabetes Maternal Aunt   . Arthritis Brother   . Asthma Brother   . Stroke Maternal Grandmother   . Pulmonary embolism Daughter     Social History   Socioeconomic History  . Marital status: Widowed    Spouse name: Not on file  . Number of children: 4  . Years of education: Not on file  . Highest education level: Not on file  Occupational History  . Occupation: retired crossing guard  . 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 Smoking status: Former Smoker    Packs/day: 1.00    Years: 40.00    Pack years: 40.00    Types: Cigarettes    Quit date: 07/06/1993    Years since quitting: 26.2  . Smokeless tobacco: Never Used  Substance and Sexual Activity  . Alcohol use: No    Alcohol/week: 0.0 standard drinks  . Drug use: No  . Sexual activity: Not Currently  Other Topics Concern  . 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 Difficulty of Paying Living Expenses:   Food Insecurity:   . Worried About Charity fundraiser in the Last Year:   . Arboriculturist in the Last Year:   Transportation Needs:   . Film/video editor (Medical):   Marland Kitchen  Lack of Transportation (Non-Medical):   Physical Activity:   . Days of Exercise per Week:   . Minutes of Exercise per Session:   Stress:   . Feeling of Stress :   Social Connections:   . Frequency of Communication with Friends and Family:   . Frequency of Social Gatherings with Friends and Family:   . Attends Religious Services:   . Active Member of Clubs or Organizations:   . Attends Archivist Meetings:   Marland Kitchen Marital Status:   Intimate Partner Violence:   . Fear of Current or Ex-Partner:   . Emotionally Abused:   Marland Kitchen Physically Abused:   . Sexually Abused:    Review of Systems  Eating okay Sleep is still not great--but does dozes at times (and "is gone") Breathing seems worse---wheezing is significant (has to sleep sitting up)     Objective:   Physical Exam  Constitutional: No distress.  Cardiovascular: Normal rate, regular rhythm and normal heart sounds. Exam reveals no gallop.  No murmur heard. Respiratory: Effort normal. No respiratory distress. She has no wheezes. She has no rales.  Decreased breath sounds L>R base ?dullness to percussion  GI: Soft. There is no abdominal tenderness.  Excoriations along flanks with slight wetness on paper she has there--may be more from under pannus laterally along flanks (no sig fungal infection though)  Musculoskeletal:     Comments: No sig pitting but thick calves/thighs           Assessment & Plan:

## 2019-10-18 ENCOUNTER — Other Ambulatory Visit: Payer: Self-pay | Admitting: Orthopedic Surgery

## 2019-10-18 DIAGNOSIS — M1612 Unilateral primary osteoarthritis, left hip: Secondary | ICD-10-CM

## 2019-10-18 DIAGNOSIS — M1611 Unilateral primary osteoarthritis, right hip: Secondary | ICD-10-CM

## 2019-10-19 ENCOUNTER — Ambulatory Visit
Admission: RE | Admit: 2019-10-19 | Discharge: 2019-10-19 | Disposition: A | Discharge status: Home / Self Care | Payer: Medicare Other | Source: Ambulatory Visit | Attending: Orthopedic Surgery | Admitting: Orthopedic Surgery

## 2019-10-19 DIAGNOSIS — M1611 Unilateral primary osteoarthritis, right hip: Secondary | ICD-10-CM

## 2019-10-19 DIAGNOSIS — M25551 Pain in right hip: Secondary | ICD-10-CM | POA: Diagnosis not present

## 2019-10-19 DIAGNOSIS — G8929 Other chronic pain: Secondary | ICD-10-CM | POA: Diagnosis not present

## 2019-10-19 MED ORDER — METHYLPREDNISOLONE ACETATE 40 MG/ML INJ SUSP (RADIOLOG
120.0000 mg | Freq: Once | INTRAMUSCULAR | Status: AC
  Administered 2019-10-19: 120 mg via INTRA_ARTICULAR

## 2019-10-19 MED ORDER — IOPAMIDOL (ISOVUE-M 200) INJECTION 41%
1.0000 mL | Freq: Once | INTRAMUSCULAR | Status: AC
  Administered 2019-10-19: 13:00:00 1 mL via INTRA_ARTICULAR

## 2019-11-02 ENCOUNTER — Encounter: Payer: Self-pay | Admitting: Internal Medicine

## 2019-11-02 ENCOUNTER — Ambulatory Visit
Admission: RE | Admit: 2019-11-02 | Discharge: 2019-11-02 | Disposition: A | Discharge status: Home / Self Care | Payer: Medicare Other | Source: Ambulatory Visit | Attending: Orthopedic Surgery | Admitting: Orthopedic Surgery

## 2019-11-02 ENCOUNTER — Ambulatory Visit (INDEPENDENT_AMBULATORY_CARE_PROVIDER_SITE_OTHER): Payer: Medicare Other | Admitting: Internal Medicine

## 2019-11-02 ENCOUNTER — Other Ambulatory Visit: Payer: Self-pay

## 2019-11-02 VITALS — BP 142/64 | HR 99 | Temp 98.2°F | Ht 62.0 in | Wt 281.2 lb

## 2019-11-02 DIAGNOSIS — M25552 Pain in left hip: Secondary | ICD-10-CM | POA: Diagnosis not present

## 2019-11-02 DIAGNOSIS — M1612 Unilateral primary osteoarthritis, left hip: Secondary | ICD-10-CM

## 2019-11-02 DIAGNOSIS — D500 Iron deficiency anemia secondary to blood loss (chronic): Secondary | ICD-10-CM

## 2019-11-02 DIAGNOSIS — I2699 Other pulmonary embolism without acute cor pulmonale: Secondary | ICD-10-CM

## 2019-11-02 DIAGNOSIS — I5032 Chronic diastolic (congestive) heart failure: Secondary | ICD-10-CM | POA: Diagnosis not present

## 2019-11-02 MED ORDER — IOPAMIDOL (ISOVUE-M 200) INJECTION 41%
1.0000 mL | Freq: Once | INTRAMUSCULAR | Status: AC
  Administered 2019-11-02: 1 mL via INTRA_ARTICULAR

## 2019-11-02 MED ORDER — METHYLPREDNISOLONE ACETATE 40 MG/ML INJ SUSP (RADIOLOG
120.0000 mg | Freq: Once | INTRAMUSCULAR | Status: AC
  Administered 2019-11-02: 120 mg via INTRA_ARTICULAR

## 2019-11-02 NOTE — Progress Notes (Signed)
Subjective:    Patient ID: Chelsea Keller, female    DOB: 1935/02/27, 85 y.o.   MRN: [id number]  HPI Here with daughter Chelsea Keller due to increased fatigue This visit occurred during the SARS-CoV-2 public health emergency.  Safety protocols were in place, including screening questions prior to the visit, additional usage of staff PPE, and extensive cleaning of exam room while observing appropriate contact time as indicated for disinfecting solutions.   Feels like her anemia may be worse No iron in some time due to the reaction Got 2 units blood 3 weeks ago when hemoglobin down to 6.8 Not excessive somnolence though Does have dizziness--some sense of spinning when she gets up quick  Weeping is better Not coming from the hips Still leaking out of lateral left calf though Had been taking lasix daily till 2 days ago---"It was not drawing anything out" Still easy DOE--but not with shower (but SOB just walking around the house) Pulse ox has remained good No chest pain No palpitations  Current Outpatient Medications on File Prior to Visit  Medication Sig Dispense Refill  . acetaminophen (TYLENOL) 500 MG tablet Take 1,000-1,500 mg by mouth every 6 (six) hours as needed for moderate pain or headache (pain).     Marland Kitchen apixaban (ELIQUIS) 5 MG TABS tablet Take 1 tablet (5 mg total) by mouth 2 (two) times daily. 180 tablet 3  . furosemide (LASIX) 80 MG tablet Take 1 tablet (80 mg total) by mouth daily. Pt is cutting in half 30 tablet 11  . gabapentin (NEURONTIN) 300 MG capsule Take 1 capsule in the morning and 2 capsules at bedtime 270 capsule 3  . hydrOXYzine (ATARAX/VISTARIL) 10 MG tablet Take 1 tablet (10 mg total) by mouth 3 (three) times daily as needed. 90 tablet 0  . nystatin (NYSTATIN) powder Apply 1 application topically 3 (three) times daily. 60 g 1  . potassium chloride SA (K-DUR,KLOR-CON) 20 MEQ tablet TAKE 2 TABLETS (40 MEQ TOTAL) DAILY (Patient taking differently: Take 40 mEq by mouth daily.  ) 180 tablet 4  . triamterene-hydrochlorothiazide (MAXZIDE-25) 37.5-25 MG tablet Take 1 tablet by mouth daily. 90 tablet 3   No current facility-administered medications on file prior to visit.    Allergies  Allergen Reactions  . Codeine Sulfate Shortness Of Breath  . Celecoxib Other (See Comments)    Caused vaginal bleeding  . Erythromycin Base Nausea And Vomiting    Past Medical History:  Diagnosis Date  . Allergy   . Anemia   . Anxiety   . Arthritis   . Arthritis of sacroiliac joint of both sides 11/12/2017  . Bilateral pulmonary embolism ([place]) 06/23/2017  . Chronic diastolic CHF (congestive heart failure) (HCC)    a. Echo 1/16:  mild LVH, EF normal, grade 1 DD, MAC  . Chronic venous insufficiency    chronic LE edema  . DDD (degenerative disc disease), lumbar 11/12/2017  . Degenerative joint disease (DJD) of hip, Bilateral  11/12/2017  . Depression   . Fibromyalgia    constant pain  . Hx of cardiac catheterization    a. LHC in [place] "ok" per patient with mild plaque in a single vessel - records not available  . Hx of cardiovascular stress test    a. Nuclear study in 2008 normal  . Hx of colonic polyps   . Hypertension   . Hypertriglyceridemia   . Impaired fasting glucose   . PONV (postoperative nausea and vomiting)   . PUD (peptic ulcer disease)  hx of gastric ulcer  . Pulmonary emboli ([place]) 06/2017  . Vitamin B12 deficiency     Past Surgical History:  Procedure Laterality Date  . ABDOMINAL HYSTERECTOMY    . CATARACT EXTRACTION  10/2003   OD  . CHOLECYSTECTOMY    . COLONOSCOPY W/ POLYPECTOMY    . COLONOSCOPY WITH PROPOFOL N/A 10/15/2014   Procedure: COLONOSCOPY WITH PROPOFOL;  Surgeon: Ladene Artist, MD;  Location: [facility] ENDOSCOPY;  Service: Endoscopy;  Laterality: N/A;  . COLONOSCOPY WITH PROPOFOL N/A 03/23/2018   Procedure: COLONOSCOPY WITH PROPOFOL;  Surgeon: Gatha Mayer, MD;  Location: [facility] ENDOSCOPY;  Service: Endoscopy;  Laterality: N/A;  .  ESOPHAGOGASTRODUODENOSCOPY (EGD) WITH PROPOFOL N/A 10/15/2014   Procedure: ESOPHAGOGASTRODUODENOSCOPY (EGD) WITH PROPOFOL;  Surgeon: Ladene Artist, MD;  Location: [facility] ENDOSCOPY;  Service: Endoscopy;  Laterality: N/A;  . EXTERNAL FIXATION ANKLE FRACTURE     Fx.  left ankle-fixation with pins later removed sec to infection 1985  . EXTERNAL FIXATION WRIST FRACTURE  1985   left with pins  . EYE SURGERY    . FEMUR FRACTURE SURGERY  06/2009  . FRACTURE SURGERY    . HARDWARE REMOVAL Left 10/05/2013   Procedure: HARDWARE REMOVAL LEFT DISTAL FEMUR;  Surgeon: Rozanna Box, MD;  Location: [place];  Service: Orthopedics;  Laterality: Left;  . HEMIARTHROPLASTY SHOULDER FRACTURE  06/2009  . HOT HEMOSTASIS N/A 03/23/2018   Procedure: HOT HEMOSTASIS (ARGON PLASMA COAGULATION/BICAP);  Surgeon: Gatha Mayer, MD;  Location: [facility] ENDOSCOPY;  Service: Endoscopy;  Laterality: N/A;  . JOINT REPLACEMENT    . STERIOD INJECTION Right 10/05/2013   Procedure: STEROID INJECTION;  Surgeon: Rozanna Box, MD;  Location: [place];  Service: Orthopedics;  Laterality: Right;  . TONSILLECTOMY    . TONSILLECTOMY    . TOTAL KNEE ARTHROPLASTY  03/09   left    Family History  Problem Relation Age of Onset  . Depression Mother   . Cancer Mother        uterine cancer  . Heart attack Father   . Cancer Brother        prostate cancer  . Diabetes Maternal Aunt   . Arthritis Brother   . Asthma Brother   . Stroke Maternal Grandmother   . Pulmonary embolism Daughter     Social History   Socioeconomic History  . Marital status: Widowed    Spouse name: Not on file  . Number of children: 4  . Years of education: Not on file  . Highest education level: Not on file  Occupational History  . Occupation: retired crossing guard  . 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 Smoking status: Former Smoker    Packs/day: 1.00    Years: 40.00    Pack years: 40.00    Types: Cigarettes    Quit date: 07/06/1993     Years since quitting: 26.3  . Smokeless tobacco: Never Used  Substance and Sexual Activity  . Alcohol use: No    Alcohol/week: 0.0 standard drinks  . Drug use: No  . Sexual activity: Not Currently  Other Topics Concern  . 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 Difficulty of Paying Living Expenses:   Food Insecurity:   . Worried About Charity fundraiser in the Last Year:   . Arboriculturist in the Last Year:   Transportation Needs:   . Film/video editor (Medical):   Marland Kitchen Lack of Transportation (Non-Medical):   Physical Activity:   . Days of Exercise per Week:   . Minutes of Exercise per Session:   Stress:   . Feeling of Stress :   Social Connections:   . Frequency of Communication with Friends and Family:   . Frequency of Social Gatherings with Friends and Family:   . Attends Religious Services:   . Active Member of Clubs or Organizations:   . Attends Archivist Meetings:   Marland Kitchen Marital Status:   Intimate Partner Violence:   . Fear of Current or Ex-Partner:   . Emotionally Abused:   Marland Kitchen Physically Abused:   . Sexually Abused:    Review of Systems Appetite is fine Weight reasonably stable Does sleep okay--goes at 1AM (hydroxyzine helps her feel tired)     Objective:   Physical Exam  Constitutional: No distress.  Cardiovascular: Normal rate, regular rhythm and normal heart sounds. Exam reveals no gallop.  No murmur heard. Respiratory: Effort normal and breath sounds normal. No respiratory distress. She has no wheezes. She has no rales.  Musculoskeletal:     Comments: Reedy edema along  lateral left upper calf. Rest of calves actually look better--less edema           Assessment & Plan:

## 2019-11-02 NOTE — Assessment & Plan Note (Signed)
EF fine Has chronic peripheral fluid overload--but I think her DOE is mostly deconditioning and anemia Continue furosemide

## 2019-11-02 NOTE — Assessment & Plan Note (Signed)
This seems to be causing the fatigue she currently has Will recheck and send copy to Dr Lindi Adie

## 2019-11-02 NOTE — Assessment & Plan Note (Signed)
I don't think that is causing her dyspnea She is on the eliquis after this recurred

## 2019-11-03 LAB — CBC
HCT: 26.1 % — ABNORMAL LOW (ref 36.0–46.0)
Hemoglobin: 8.1 g/dL — ABNORMAL LOW (ref 12.0–15.0)
MCHC: 31 g/dL (ref 30.0–36.0)
MCV: 80.3 fl (ref 78.0–100.0)
Platelets: 377 10*3/uL (ref 150.0–400.0)
RBC: 3.25 Mil/uL — ABNORMAL LOW (ref 3.87–5.11)
RDW: 21.3 % — ABNORMAL HIGH (ref 11.5–15.5)
WBC: 12.6 10*3/uL — ABNORMAL HIGH (ref 4.0–10.5)

## 2019-11-03 LAB — RENAL FUNCTION PANEL
Albumin: 3.5 g/dL (ref 3.5–5.2)
BUN: 23 mg/dL (ref 6–23)
CO2: 25 mEq/L (ref 19–32)
Calcium: 8.8 mg/dL (ref 8.4–10.5)
Chloride: 102 mEq/L (ref 96–112)
Creatinine, Ser: 1.16 mg/dL (ref 0.40–1.20)
GFR: 44.61 mL/min — ABNORMAL LOW (ref >60.00)
Glucose, Bld: 141 mg/dL — ABNORMAL HIGH (ref 70–99)
Phosphorus: 2.8 mg/dL (ref 2.3–4.6)
Potassium: 3.9 mEq/L (ref 3.5–5.1)
Sodium: 140 mEq/L (ref 135–145)

## 2019-11-05 ENCOUNTER — Other Ambulatory Visit: Payer: Self-pay | Admitting: Internal Medicine

## 2019-11-16 ENCOUNTER — Encounter: Payer: Self-pay | Admitting: Internal Medicine

## 2019-11-16 ENCOUNTER — Ambulatory Visit (INDEPENDENT_AMBULATORY_CARE_PROVIDER_SITE_OTHER): Payer: Medicare Other | Admitting: Internal Medicine

## 2019-11-16 ENCOUNTER — Other Ambulatory Visit: Payer: Self-pay

## 2019-11-16 DIAGNOSIS — F39 Unspecified mood [affective] disorder: Secondary | ICD-10-CM | POA: Diagnosis not present

## 2019-11-16 DIAGNOSIS — D500 Iron deficiency anemia secondary to blood loss (chronic): Secondary | ICD-10-CM | POA: Diagnosis not present

## 2019-11-16 DIAGNOSIS — I5032 Chronic diastolic (congestive) heart failure: Secondary | ICD-10-CM | POA: Diagnosis not present

## 2019-11-16 NOTE — Assessment & Plan Note (Signed)
Seems to be compensated Continue the furosemide Volume status is better and weeping in legs is gone

## 2019-11-16 NOTE — Progress Notes (Signed)
Subjective:    Patient ID: Chelsea Keller, female    DOB: 1934/09/24, 85 y.o.   MRN: [id number]  HPI Here for follow up of multiple medical issues With daughter [name] as usual This visit occurred during the SARS-CoV-2 public health emergency.  Safety protocols were in place, including screening questions prior to the visit, additional usage of staff PPE, and extensive cleaning of exam room while observing appropriate contact time as indicated for disinfecting solutions.   Feels about the same Hasn't seen Dr Lindi Adie since the last visit  Weeping stopped! Swelling in legs is about the same Arms are itching---medication is not helping Wonders about benedryl--discussed cautiously trying just at bedtime  Breathing is still abnormal Walks flat with rollator without problems for short distances No problems with showering (uses bench) Will be SOB after going to [place], doing her thing and then walking back No chest pain No palpitaitons  Current Outpatient Medications on File Prior to Visit  Medication Sig Dispense Refill  . acetaminophen (TYLENOL) 500 MG tablet Take 1,000-1,500 mg by mouth every 6 (six) hours as needed for moderate pain or headache (pain).     Marland Kitchen apixaban (ELIQUIS) 5 MG TABS tablet Take 1 tablet (5 mg total) by mouth 2 (two) times daily. 180 tablet 3  . furosemide (LASIX) 80 MG tablet Take 1 tablet (80 mg total) by mouth daily. Pt is cutting in half 30 tablet 11  . gabapentin (NEURONTIN) 300 MG capsule Take 1 capsule in the morning and 2 capsules at bedtime 270 capsule 3  . hydrOXYzine (ATARAX/VISTARIL) 10 MG tablet TAKE 1 TABLET BY MOUTH THREE TIMES A DAY AS NEEDED 90 tablet 0  . nystatin (NYSTATIN) powder Apply 1 application topically 3 (three) times daily. 60 g 1  . potassium chloride SA (K-DUR,KLOR-CON) 20 MEQ tablet TAKE 2 TABLETS (40 MEQ TOTAL) DAILY (Patient taking differently: Take 40 mEq by mouth daily. ) 180 tablet 4  . triamterene-hydrochlorothiazide (MAXZIDE-25)  37.5-25 MG tablet Take 1 tablet by mouth daily. 90 tablet 3   No current facility-administered medications on file prior to visit.    Allergies  Allergen Reactions  . Codeine Sulfate Shortness Of Breath  . Celecoxib Other (See Comments)    Caused vaginal bleeding  . Erythromycin Base Nausea And Vomiting    Past Medical History:  Diagnosis Date  . Allergy   . Anemia   . Anxiety   . Arthritis   . Arthritis of sacroiliac joint of both sides 11/12/2017  . Bilateral pulmonary embolism ([place]) 06/23/2017  . Chronic diastolic CHF (congestive heart failure) (HCC)    a. Echo 1/16:  mild LVH, EF normal, grade 1 DD, MAC  . Chronic venous insufficiency    chronic LE edema  . DDD (degenerative disc disease), lumbar 11/12/2017  . Degenerative joint disease (DJD) of hip, Bilateral  11/12/2017  . Depression   . Fibromyalgia    constant pain  . Hx of cardiac catheterization    a. LHC in [place] "ok" per patient with mild plaque in a single vessel - records not available  . Hx of cardiovascular stress test    a. Nuclear study in 2008 normal  . Hx of colonic polyps   . Hypertension   . Hypertriglyceridemia   . Impaired fasting glucose   . PONV (postoperative nausea and vomiting)   . PUD (peptic ulcer disease)    hx of gastric ulcer  . Pulmonary emboli ([facility]) 06/2017  . Vitamin B12 deficiency  Past Surgical History:  Procedure Laterality Date  . ABDOMINAL HYSTERECTOMY    . CATARACT EXTRACTION  10/2003   OD  . CHOLECYSTECTOMY    . COLONOSCOPY W/ POLYPECTOMY    . COLONOSCOPY WITH PROPOFOL N/A 10/15/2014   Procedure: COLONOSCOPY WITH PROPOFOL;  Surgeon: Ladene Artist, MD;  Location: [facility] ENDOSCOPY;  Service: Endoscopy;  Laterality: N/A;  . COLONOSCOPY WITH PROPOFOL N/A 03/23/2018   Procedure: COLONOSCOPY WITH PROPOFOL;  Surgeon: Gatha Mayer, MD;  Location: [facility] ENDOSCOPY;  Service: Endoscopy;  Laterality: N/A;  . ESOPHAGOGASTRODUODENOSCOPY (EGD) WITH PROPOFOL N/A 10/15/2014   Procedure:  ESOPHAGOGASTRODUODENOSCOPY (EGD) WITH PROPOFOL;  Surgeon: Ladene Artist, MD;  Location: [facility] ENDOSCOPY;  Service: Endoscopy;  Laterality: N/A;  . EXTERNAL FIXATION ANKLE FRACTURE     Fx.  left ankle-fixation with pins later removed sec to infection 1985  . EXTERNAL FIXATION WRIST FRACTURE  1985   left with pins  . EYE SURGERY    . FEMUR FRACTURE SURGERY  06/2009  . FRACTURE SURGERY    . HARDWARE REMOVAL Left 10/05/2013   Procedure: HARDWARE REMOVAL LEFT DISTAL FEMUR;  Surgeon: Rozanna Box, MD;  Location: [facility];  Service: Orthopedics;  Laterality: Left;  . HEMIARTHROPLASTY SHOULDER FRACTURE  06/2009  . HOT HEMOSTASIS N/A 03/23/2018   Procedure: HOT HEMOSTASIS (ARGON PLASMA COAGULATION/BICAP);  Surgeon: Gatha Mayer, MD;  Location: [facility] ENDOSCOPY;  Service: Endoscopy;  Laterality: N/A;  . JOINT REPLACEMENT    . STERIOD INJECTION Right 10/05/2013   Procedure: STEROID INJECTION;  Surgeon: Rozanna Box, MD;  Location: [facility];  Service: Orthopedics;  Laterality: Right;  . TONSILLECTOMY    . TONSILLECTOMY    . TOTAL KNEE ARTHROPLASTY  03/09   left    Family History  Problem Relation Age of Onset  . Depression Mother   . Cancer Mother        uterine cancer  . Heart attack Father   . Cancer Brother        prostate cancer  . Diabetes Maternal Aunt   . Arthritis Brother   . Asthma Brother   . Stroke Maternal Grandmother   . Pulmonary embolism Daughter     Social History   Socioeconomic History  . Marital status: Widowed    Spouse name: Not on file  . Number of children: 4  . Years of education: Not on file  . Highest education level: Not on file  Occupational History  . Occupation: retired crossing guard  . 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 Smoking status: Former Smoker    Packs/day: 1.00    Years: 40.00    Pack years: 40.00    Types: Cigarettes    Quit date: 07/06/1993    Years since quitting: 26.3  . Smokeless tobacco: Never Used  Substance  and Sexual Activity  . Alcohol use: No    Alcohol/week: 0.0 standard drinks  . Drug use: No  . Sexual activity: Not Currently  Other Topics Concern  . 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  Difficulty of Paying Living Expenses:   Food Insecurity:   . Worried About Charity fundraiser in the Last Year:   . Arboriculturist in the Last Year:   Transportation Needs:   . Film/video editor (Medical):   Marland Kitchen Lack of Transportation (Non-Medical):   Physical Activity:   . Days of Exercise per Week:   . Minutes of Exercise per Session:   Stress:   . Feeling of Stress :   Social Connections:   . Frequency of Communication with Friends and Family:   . Frequency of Social Gatherings with Friends and Family:   . Attends Religious Services:   . Active Member of Clubs or Organizations:   . Attends Archivist Meetings:   Marland Kitchen Marital Status:   Intimate Partner Violence:   . Fear of Current or Ex-Partner:   . Emotionally Abused:   Marland Kitchen Physically Abused:   . Sexually Abused:    Review of Systems  Appetite is "dwindled" Usually initiates sleep 2AM--but only sleeps for 1-2 hours. Goes back to bed when everyone leaves house     Objective:   Physical Exam  Constitutional: No distress.  Neck: No thyromegaly present.  Cardiovascular: Normal rate and regular rhythm. Exam reveals no gallop.  Respiratory: Effort normal and breath sounds normal. No respiratory distress. She has no wheezes. She has no rales.  Musculoskeletal:     Comments: Thick calves--especially upper--but no pitting This is better    Lymphadenopathy:    She has no cervical adenopathy.  Psychiatric:  Frustrated but not really depressed           Assessment & Plan:

## 2019-11-16 NOTE — Assessment & Plan Note (Signed)
Goal would be to get her near 10 and maintain She may be willing to try iron infusion again Will refer back to Dr Lindi Adie

## 2019-11-16 NOTE — Assessment & Plan Note (Signed)
Frustrated by life and feeling tired/disabled No MDD May be some better now Rx not indicated

## 2019-11-17 ENCOUNTER — Other Ambulatory Visit: Payer: Self-pay | Admitting: *Deleted

## 2019-11-17 ENCOUNTER — Encounter: Payer: Self-pay | Admitting: *Deleted

## 2019-11-17 DIAGNOSIS — D500 Iron deficiency anemia secondary to blood loss (chronic): Secondary | ICD-10-CM

## 2019-11-19 NOTE — Progress Notes (Signed)
Patient Care Team: Venia Carbon, MD as PCP - General Angelena Form Annita Brod, MD as PCP - Cardiology (Cardiology) Sharmon Revere as Physician Assistant (Physician Assistant)  DIAGNOSIS:    ICD-10-CM   1. Iron deficiency anemia due to chronic blood loss  D50.0     CHIEF COMPLIANT: Follow-up of iron deficiency anemia and bilateral PEs  INTERVAL HISTORY: Chelsea Keller is a 85 y.o. with above-mentioned history of iron deficiency anemiapreviously treated with IV iron and bilateral PEs currently on Eliquis. Labs on 11/02/19 showed Hg 8.1, HCT 26.1, MCV 80.3, WBC 12.6. Shepresents to the clinic today for follow-up. She has been complaining of fatigue dizziness weakness and worsening symptoms of anemia. Patient is in a wheelchair with a chronic lower extremity edema.  ALLERGIES:  is allergic to codeine sulfate; celecoxib; and erythromycin base.  MEDICATIONS:  Current Outpatient Medications  Medication Sig Dispense Refill  . acetaminophen (TYLENOL) 500 MG tablet Take 1,000-1,500 mg by mouth every 6 (six) hours as needed for moderate pain or headache (pain).     Marland Kitchen apixaban (ELIQUIS) 5 MG TABS tablet Take 1 tablet (5 mg total) by mouth 2 (two) times daily. 180 tablet 3  . furosemide (LASIX) 80 MG tablet Take 1 tablet (80 mg total) by mouth daily. Pt is cutting in half 30 tablet 11  . gabapentin (NEURONTIN) 300 MG capsule Take 1 capsule in the morning and 2 capsules at bedtime 270 capsule 3  . hydrOXYzine (ATARAX/VISTARIL) 10 MG tablet TAKE 1 TABLET BY MOUTH THREE TIMES A DAY AS NEEDED 90 tablet 0  . nystatin (NYSTATIN) powder Apply 1 application topically 3 (three) times daily. 60 g 1  . potassium chloride SA (K-DUR,KLOR-CON) 20 MEQ tablet TAKE 2 TABLETS (40 MEQ TOTAL) DAILY (Patient taking differently: Take 40 mEq by mouth daily. ) 180 tablet 4  . triamterene-hydrochlorothiazide (MAXZIDE-25) 37.5-25 MG tablet Take 1 tablet by mouth daily. 90 tablet 3   No current  facility-administered medications for this visit.    PHYSICAL EXAMINATION: ECOG PERFORMANCE STATUS: 1 - Symptomatic but completely ambulatory  Vitals:   11/20/19 1149  BP: 138/73  Pulse: 94  Resp: 16  Temp: 98.9 F (37.2 C)  SpO2: 99%   Filed Weights   11/20/19 1149  Weight: 284 lb (128.8 kg)     LABORATORY DATA:  I have reviewed the data as listed CMP Latest Ref Rng & Units 11/02/2019 09/18/2019 06/21/2019  Glucose 70 - 99 mg/dL 141(H) 104(H) 102(H)  BUN 6 - 23 mg/dL 23 20 24(H)  Creatinine 0.40 - 1.20 mg/dL 1.16 1.05 0.82  Sodium 135 - 145 mEq/L 140 139 143  Potassium 3.5 - 5.1 mEq/L 3.9 3.9 3.4(L)  Chloride 96 - 112 mEq/L 102 99 107  CO2 19 - 32 mEq/L 25 30 26   Calcium 8.4 - 10.5 mg/dL 8.8 8.8 8.3(L)  Total Protein 6.0 - 8.3 g/dL - 5.4(L) 5.5(L)  Total Bilirubin 0.2 - 1.2 mg/dL - 0.7 0.4  Alkaline Phos 39 - 117 U/L - 89 86  AST 0 - 37 U/L - 21 23  ALT 0 - 35 U/L - 11 15    Lab Results  Component Value Date   WBC 7.0 11/20/2019   HGB 7.1 (L) 11/20/2019   HCT 25.5 (L) 11/20/2019   MCV 81.2 11/20/2019   PLT 220 11/20/2019   NEUTROABS 4.2 11/20/2019    ASSESSMENT & PLAN:  Iron deficiency anemia due to chronic blood loss Anemia of chronic disease  Hospitalization 08/11/2018:  Severe GI bleed hemoglobin 4.9(internal hemorrhoids and angiodysplasia of the colon)Received blood transfusions IV iron treatment: September 2019, March 2020, Injectafer June 2020, Venofer January 2021  Toxicities of iron infusion:Patient gets profound diarrhea which last for 2 to 3 days afterFeraheme (and prescription for Lomotil for 3 days).  With Venofer she did okay but she still had profound diarrhea as well as lethargic  10/10/2019: Hemoglobin 6.8: MCV 82, RDW 18: 2 units of PRBC given on 10/11/2019. 11/03/2019: Hemoglobin 8.1 (iron studies and erythropoietin levels are pending) 11/20/2019: Hemoglobin 7.1, MCV 81.2 Iron studies ferritin 7, iron saturation 6%, absolute reticulocyte count  88.3 I recommended 2 units of PRBC.  Because of her severe intolerance with the IV iron we are unable to do so.  She will continue with oral iron. I would also recommend Aranesp therapy for anemia of chronic disease.   No orders of the defined types were placed in this encounter.  The patient has a good understanding of the overall plan. she agrees with it. she will call with any problems that may develop before the next visit here.  Total time spent: 30 mins including face to face time and time spent for planning, charting and coordination of care  Nicholas Lose, MD 11/20/2019  I, Cloyde Reams Dorshimer, am acting as scribe for Dr. Nicholas Lose.  I have reviewed the above documentation for accuracy and completeness, and I agree with the above.

## 2019-11-20 ENCOUNTER — Inpatient Hospital Stay: Payer: Medicare Other | Attending: Hematology and Oncology

## 2019-11-20 ENCOUNTER — Other Ambulatory Visit: Payer: Self-pay

## 2019-11-20 ENCOUNTER — Inpatient Hospital Stay (HOSPITAL_BASED_OUTPATIENT_CLINIC_OR_DEPARTMENT_OTHER): Payer: Medicare Other | Admitting: Hematology and Oncology

## 2019-11-20 ENCOUNTER — Other Ambulatory Visit: Payer: Self-pay | Admitting: *Deleted

## 2019-11-20 ENCOUNTER — Telehealth: Payer: Self-pay | Admitting: Hematology and Oncology

## 2019-11-20 VITALS — BP 138/73 | HR 94 | Temp 98.9°F | Resp 16 | Wt 284.0 lb

## 2019-11-20 DIAGNOSIS — E611 Iron deficiency: Secondary | ICD-10-CM | POA: Diagnosis not present

## 2019-11-20 DIAGNOSIS — R600 Localized edema: Secondary | ICD-10-CM | POA: Diagnosis not present

## 2019-11-20 DIAGNOSIS — K922 Gastrointestinal hemorrhage, unspecified: Secondary | ICD-10-CM | POA: Diagnosis not present

## 2019-11-20 DIAGNOSIS — R5383 Other fatigue: Secondary | ICD-10-CM | POA: Diagnosis not present

## 2019-11-20 DIAGNOSIS — Z79899 Other long term (current) drug therapy: Secondary | ICD-10-CM | POA: Diagnosis not present

## 2019-11-20 DIAGNOSIS — D500 Iron deficiency anemia secondary to blood loss (chronic): Secondary | ICD-10-CM

## 2019-11-20 DIAGNOSIS — N183 Chronic kidney disease, stage 3 unspecified: Secondary | ICD-10-CM | POA: Insufficient documentation

## 2019-11-20 DIAGNOSIS — D638 Anemia in other chronic diseases classified elsewhere: Secondary | ICD-10-CM | POA: Diagnosis not present

## 2019-11-20 DIAGNOSIS — Z881 Allergy status to other antibiotic agents status: Secondary | ICD-10-CM | POA: Diagnosis not present

## 2019-11-20 DIAGNOSIS — K5520 Angiodysplasia of colon without hemorrhage: Secondary | ICD-10-CM | POA: Diagnosis not present

## 2019-11-20 DIAGNOSIS — D631 Anemia in chronic kidney disease: Secondary | ICD-10-CM | POA: Insufficient documentation

## 2019-11-20 DIAGNOSIS — Z7901 Long term (current) use of anticoagulants: Secondary | ICD-10-CM | POA: Diagnosis not present

## 2019-11-20 DIAGNOSIS — I2699 Other pulmonary embolism without acute cor pulmonale: Secondary | ICD-10-CM | POA: Insufficient documentation

## 2019-11-20 DIAGNOSIS — K648 Other hemorrhoids: Secondary | ICD-10-CM | POA: Insufficient documentation

## 2019-11-20 DIAGNOSIS — R531 Weakness: Secondary | ICD-10-CM | POA: Insufficient documentation

## 2019-11-20 DIAGNOSIS — R197 Diarrhea, unspecified: Secondary | ICD-10-CM | POA: Insufficient documentation

## 2019-11-20 DIAGNOSIS — Z885 Allergy status to narcotic agent status: Secondary | ICD-10-CM | POA: Insufficient documentation

## 2019-11-20 DIAGNOSIS — D649 Anemia, unspecified: Secondary | ICD-10-CM

## 2019-11-20 DIAGNOSIS — R42 Dizziness and giddiness: Secondary | ICD-10-CM | POA: Insufficient documentation

## 2019-11-20 LAB — CBC WITH DIFFERENTIAL (CANCER CENTER ONLY)
Abs Immature Granulocytes: 0.05 10*3/uL (ref 0.00–0.07)
Basophils Absolute: 0 10*3/uL (ref 0.0–0.1)
Basophils Relative: 1 %
Eosinophils Absolute: 0.9 10*3/uL — ABNORMAL HIGH (ref 0.0–0.5)
Eosinophils Relative: 12 %
HCT: 25.5 % — ABNORMAL LOW (ref 36.0–46.0)
Hemoglobin: 7.1 g/dL — ABNORMAL LOW (ref 12.0–15.0)
Immature Granulocytes: 1 %
Lymphocytes Relative: 15 %
Lymphs Abs: 1.1 10*3/uL (ref 0.7–4.0)
MCH: 22.6 pg — ABNORMAL LOW (ref 26.0–34.0)
MCHC: 27.8 g/dL — ABNORMAL LOW (ref 30.0–36.0)
MCV: 81.2 fL (ref 80.0–100.0)
Monocytes Absolute: 0.8 10*3/uL (ref 0.1–1.0)
Monocytes Relative: 12 %
Neutro Abs: 4.2 10*3/uL (ref 1.7–7.7)
Neutrophils Relative %: 59 %
Platelet Count: 220 10*3/uL (ref 150–400)
RBC: 3.14 MIL/uL — ABNORMAL LOW (ref 3.87–5.11)
RDW: 18.8 % — ABNORMAL HIGH (ref 11.5–15.5)
WBC Count: 7 10*3/uL (ref 4.0–10.5)
nRBC: 0.3 % — ABNORMAL HIGH (ref 0.0–0.2)

## 2019-11-20 LAB — IRON AND TIBC
Iron: 19 ug/dL — ABNORMAL LOW (ref 41–142)
Saturation Ratios: 6 % — ABNORMAL LOW (ref 21–57)
TIBC: 340 ug/dL (ref 236–444)
UIBC: 321 ug/dL (ref 120–384)

## 2019-11-20 LAB — FERRITIN: Ferritin: 7 ng/mL — ABNORMAL LOW (ref 11–307)

## 2019-11-20 LAB — PREPARE RBC (CROSSMATCH)

## 2019-11-20 LAB — RETICULOCYTES
Immature Retic Fract: 32.3 % — ABNORMAL HIGH (ref 2.3–15.9)
RBC.: 3.13 MIL/uL — ABNORMAL LOW (ref 3.87–5.11)
Retic Count, Absolute: 88.3 10*3/uL (ref 19.0–186.0)
Retic Ct Pct: 2.8 % (ref 0.4–3.1)

## 2019-11-20 LAB — SAMPLE TO BLOOD BANK

## 2019-11-20 NOTE — Telephone Encounter (Signed)
Scheduled appt per 5/17 sch message - pt is aware of appt date and time .   

## 2019-11-20 NOTE — Assessment & Plan Note (Signed)
Hospitalization 08/11/2018: Severe GI bleed hemoglobin 4.9(internal hemorrhoids and angiodysplasia of the colon)Received blood transfusions IV iron treatment: September 2019, March 2020, Injectafer June 2020, Venofer January 2021  Toxicities of iron infusion:Patient gets profound diarrhea which last for 2 to 3 days afterFeraheme (and prescription for Lomotil for 3 days).  With Venofer she did okay but she still had profound diarrhea as well as lethargic  10/10/2019: Hemoglobin 6.8: MCV 82, RDW 18: 2 units of PRBC given on 10/11/2019. 11/03/2019: Hemoglobin 8.1 (iron studies and erythropoietin levels are pending)

## 2019-11-21 DIAGNOSIS — H40059 Ocular hypertension, unspecified eye: Secondary | ICD-10-CM | POA: Diagnosis not present

## 2019-11-21 LAB — ERYTHROPOIETIN: Erythropoietin: 82.3 m[IU]/mL — ABNORMAL HIGH (ref 2.6–18.5)

## 2019-11-22 ENCOUNTER — Telehealth: Payer: Self-pay

## 2019-11-22 ENCOUNTER — Other Ambulatory Visit: Payer: Self-pay

## 2019-11-22 ENCOUNTER — Inpatient Hospital Stay: Payer: Medicare Other

## 2019-11-22 DIAGNOSIS — K922 Gastrointestinal hemorrhage, unspecified: Secondary | ICD-10-CM | POA: Diagnosis not present

## 2019-11-22 DIAGNOSIS — K648 Other hemorrhoids: Secondary | ICD-10-CM | POA: Diagnosis not present

## 2019-11-22 DIAGNOSIS — D631 Anemia in chronic kidney disease: Secondary | ICD-10-CM | POA: Diagnosis not present

## 2019-11-22 DIAGNOSIS — E611 Iron deficiency: Secondary | ICD-10-CM | POA: Diagnosis not present

## 2019-11-22 DIAGNOSIS — D649 Anemia, unspecified: Secondary | ICD-10-CM

## 2019-11-22 DIAGNOSIS — N183 Chronic kidney disease, stage 3 unspecified: Secondary | ICD-10-CM | POA: Diagnosis not present

## 2019-11-22 DIAGNOSIS — I2699 Other pulmonary embolism without acute cor pulmonale: Secondary | ICD-10-CM | POA: Diagnosis not present

## 2019-11-22 MED ORDER — DIPHENHYDRAMINE HCL 25 MG PO CAPS
ORAL_CAPSULE | ORAL | Status: AC
  Filled 2019-11-22: qty 1

## 2019-11-22 MED ORDER — DIPHENHYDRAMINE HCL 25 MG PO CAPS
25.0000 mg | ORAL_CAPSULE | Freq: Once | ORAL | Status: AC
  Administered 2019-11-22: 25 mg via ORAL

## 2019-11-22 MED ORDER — ACETAMINOPHEN 325 MG PO TABS: 650.0000 mg | ORAL_TABLET | Freq: Once | ORAL | Status: DC

## 2019-11-22 MED ORDER — ACETAMINOPHEN 325 MG PO TABS
ORAL_TABLET | ORAL | Status: AC
  Filled 2019-11-22: qty 2

## 2019-11-22 MED ORDER — SODIUM CHLORIDE 0.9% IV SOLUTION
250.0000 mL | Freq: Once | INTRAVENOUS | Status: AC
  Administered 2019-11-22: 250 mL via INTRAVENOUS
  Filled 2019-11-22: qty 250

## 2019-11-22 NOTE — Telephone Encounter (Signed)
Chelsea Carbon, MD  Pilar Grammes, CMA  Given her age and GFR----it does make sense to cut her dose to 2.5mg  bid. Please call her and send Rx to reduce the dose (especially in view of her apparent recurrent bleeding).

## 2019-11-22 NOTE — Telephone Encounter (Signed)
Spoke to pt. She just received a supply of 5mg . Asked about cutting it in half. I advised her it is not a scored tablet and I could not advise that. She will check with the pharmacist to see what they say and call back if she needs to get a new Rx for the 2.5mg .

## 2019-11-22 NOTE — Telephone Encounter (Signed)
[name]: Health and Safety Notification, generated on May 18, 2021May 18, 2021 Printout Information  Document Contents Document Received Date Document Source Organization  Continuity of Care Document May 18, 2021May 18, 2021 [facility]   Patient Demographics - 85 y.o. Female; born [birth year]  Patient Address Communication Language Race / Ethnicity Marital Status  [address]  [place], [place] [address] [phone] (Home) English - Spoken (Preferred) Unknown / Unknown Unknown  Health and Safety Notification  [facility] works with your patients' plan sponsors to provide you with the enclosed [employer] safety and health considerations for patients in your practice. Please review the health information provided and make any changes in therapy that you believe are appropriate. [facility] understands that the information may not be applicable to every patient's therapy and therefore presents it as informational only.  Drug Safety Consideration: ELIQUIS and MODERATE/SEVERE RENAL DYSFUNCTION Our claims record suggests that your older patient is receiving ELIQUIS and has a diagnosis of MODERATE/SEVERE RENAL DYSFUNCTION. Dose reduction to 2.5 mg twice daily is advised for patients with two or more of the following characteristics: age 72 or older, weight 60 kg or less, or serum creatinine 1.5 mg/dL or higher. Please weigh the potential risks versus benefits for your patient and consider whether dose adjustment is warranted.

## 2019-11-22 NOTE — Patient Instructions (Signed)

## 2019-11-23 ENCOUNTER — Encounter: Payer: Self-pay | Admitting: Hematology and Oncology

## 2019-11-23 LAB — BPAM RBC
Blood Product Expiration Date: 202106112359
Blood Product Expiration Date: 202106112359
ISSUE DATE / TIME: 202105191228
ISSUE DATE / TIME: 202105191228
Unit Type and Rh: 7300
Unit Type and Rh: 7300

## 2019-11-23 LAB — TYPE AND SCREEN
ABO/RH(D): B POS
Antibody Screen: NEGATIVE
Unit division: 0
Unit division: 0

## 2019-11-24 ENCOUNTER — Other Ambulatory Visit: Payer: Self-pay | Admitting: Internal Medicine

## 2019-11-24 ENCOUNTER — Other Ambulatory Visit: Payer: Self-pay | Admitting: *Deleted

## 2019-11-24 DIAGNOSIS — D649 Anemia, unspecified: Secondary | ICD-10-CM

## 2019-11-27 ENCOUNTER — Inpatient Hospital Stay: Payer: Medicare Other

## 2019-11-27 ENCOUNTER — Other Ambulatory Visit: Payer: Self-pay

## 2019-11-27 VITALS — BP 138/72 | HR 78 | Temp 98.2°F | Resp 18

## 2019-11-27 DIAGNOSIS — N183 Chronic kidney disease, stage 3 unspecified: Secondary | ICD-10-CM | POA: Diagnosis not present

## 2019-11-27 DIAGNOSIS — K922 Gastrointestinal hemorrhage, unspecified: Secondary | ICD-10-CM | POA: Diagnosis not present

## 2019-11-27 DIAGNOSIS — D649 Anemia, unspecified: Secondary | ICD-10-CM

## 2019-11-27 DIAGNOSIS — D500 Iron deficiency anemia secondary to blood loss (chronic): Secondary | ICD-10-CM

## 2019-11-27 DIAGNOSIS — E611 Iron deficiency: Secondary | ICD-10-CM | POA: Diagnosis not present

## 2019-11-27 DIAGNOSIS — K648 Other hemorrhoids: Secondary | ICD-10-CM | POA: Diagnosis not present

## 2019-11-27 DIAGNOSIS — I2699 Other pulmonary embolism without acute cor pulmonale: Secondary | ICD-10-CM | POA: Diagnosis not present

## 2019-11-27 DIAGNOSIS — D638 Anemia in other chronic diseases classified elsewhere: Secondary | ICD-10-CM

## 2019-11-27 DIAGNOSIS — D631 Anemia in chronic kidney disease: Secondary | ICD-10-CM | POA: Diagnosis not present

## 2019-11-27 LAB — CBC WITH DIFFERENTIAL (CANCER CENTER ONLY)
Abs Immature Granulocytes: 0.04 10*3/uL (ref 0.00–0.07)
Basophils Absolute: 0.1 10*3/uL (ref 0.0–0.1)
Basophils Relative: 1 %
Eosinophils Absolute: 1.3 10*3/uL — ABNORMAL HIGH (ref 0.0–0.5)
Eosinophils Relative: 19 %
HCT: 29 % — ABNORMAL LOW (ref 36.0–46.0)
Hemoglobin: 8.3 g/dL — ABNORMAL LOW (ref 12.0–15.0)
Immature Granulocytes: 1 %
Lymphocytes Relative: 20 %
Lymphs Abs: 1.4 10*3/uL (ref 0.7–4.0)
MCH: 23 pg — ABNORMAL LOW (ref 26.0–34.0)
MCHC: 28.6 g/dL — ABNORMAL LOW (ref 30.0–36.0)
MCV: 80.3 fL (ref 80.0–100.0)
Monocytes Absolute: 0.8 10*3/uL (ref 0.1–1.0)
Monocytes Relative: 12 %
Neutro Abs: 3.4 10*3/uL (ref 1.7–7.7)
Neutrophils Relative %: 47 %
Platelet Count: 239 10*3/uL (ref 150–400)
RBC: 3.61 MIL/uL — ABNORMAL LOW (ref 3.87–5.11)
RDW: 18.8 % — ABNORMAL HIGH (ref 11.5–15.5)
WBC Count: 7.1 10*3/uL (ref 4.0–10.5)
nRBC: 0 % (ref 0.0–0.2)

## 2019-11-27 LAB — CMP (CANCER CENTER ONLY)
ALT: 18 U/L (ref 0–44)
AST: 21 U/L (ref 15–41)
Albumin: 3 g/dL — ABNORMAL LOW (ref 3.5–5.0)
Alkaline Phosphatase: 99 U/L (ref 38–126)
Anion gap: 10 (ref 5–15)
BUN: 19 mg/dL (ref 8–23)
CO2: 29 mmol/L (ref 22–32)
Calcium: 8.6 mg/dL — ABNORMAL LOW (ref 8.9–10.3)
Chloride: 105 mmol/L (ref 98–111)
Creatinine: 1.16 mg/dL — ABNORMAL HIGH (ref 0.44–1.00)
GFR, Est AFR Am: 50 mL/min — ABNORMAL LOW (ref >60)
GFR, Estimated: 44 mL/min — ABNORMAL LOW (ref >60)
Glucose, Bld: 113 mg/dL — ABNORMAL HIGH (ref 70–99)
Potassium: 3.2 mmol/L — ABNORMAL LOW (ref 3.5–5.1)
Sodium: 144 mmol/L (ref 135–145)
Total Bilirubin: 1 mg/dL (ref 0.3–1.2)
Total Protein: 5.2 g/dL — ABNORMAL LOW (ref 6.5–8.1)

## 2019-11-27 LAB — TYPE AND SCREEN
ABO/RH(D): B POS
Antibody Screen: NEGATIVE

## 2019-11-27 MED ORDER — DARBEPOETIN ALFA 300 MCG/0.6ML IJ SOSY
300.0000 ug | PREFILLED_SYRINGE | Freq: Once | INTRAMUSCULAR | Status: AC
  Administered 2019-11-27: 300 ug via SUBCUTANEOUS

## 2019-11-27 NOTE — Patient Instructions (Signed)
Darbepoetin Alfa injection What is this medicine? DARBEPOETIN ALFA (dar be POE e tin AL fa) helps your body make more red blood cells. It is used to treat anemia caused by chronic kidney failure and chemotherapy. This medicine may be used for other purposes; ask your health care provider or pharmacist if you have questions. COMMON BRAND NAME(S): Aranesp What should I tell my health care provider before I take this medicine? They need to know if you have any of these conditions:  blood clotting disorders or history of blood clots  cancer patient not on chemotherapy  cystic fibrosis  heart disease, such as angina, heart failure, or a history of a heart attack  hemoglobin level of 12 g/dL or greater  high blood pressure  low levels of folate, iron, or vitamin B12  seizures  an unusual or allergic reaction to darbepoetin, erythropoietin, albumin, hamster proteins, latex, other medicines, foods, dyes, or preservatives  pregnant or trying to get pregnant  breast-feeding How should I use this medicine? This medicine is for injection into a vein or under the skin. It is usually given by a health care professional in a hospital or clinic setting. If you get this medicine at home, you will be taught how to prepare and give this medicine. Use exactly as directed. Take your medicine at regular intervals. Do not take your medicine more often than directed. It is important that you put your used needles and syringes in a special sharps container. Do not put them in a trash can. If you do not have a sharps container, call your pharmacist or healthcare provider to get one. A special MedGuide will be given to you by the pharmacist with each prescription and refill. Be sure to read this information carefully each time. Talk to your pediatrician regarding the use of this medicine in children. While this medicine may be used in children as young as 1 month of age for selected conditions, precautions do  apply. Overdosage: If you think you have taken too much of this medicine contact a poison control center or emergency room at once. NOTE: This medicine is only for you. Do not share this medicine with others. What if I miss a dose? If you miss a dose, take it as soon as you can. If it is almost time for your next dose, take only that dose. Do not take double or extra doses. What may interact with this medicine? Do not take this medicine with any of the following medications:  epoetin alfa This list may not describe all possible interactions. Give your health care provider a list of all the medicines, herbs, non-prescription drugs, or dietary supplements you use. Also tell them if you smoke, drink alcohol, or use illegal drugs. Some items may interact with your medicine. What should I watch for while using this medicine? Your condition will be monitored carefully while you are receiving this medicine. You may need blood work done while you are taking this medicine. This medicine may cause a decrease in vitamin B6. You should make sure that you get enough vitamin B6 while you are taking this medicine. Discuss the foods you eat and the vitamins you take with your health care professional. What side effects may I notice from receiving this medicine? Side effects that you should report to your doctor or health care professional as soon as possible:  allergic reactions like skin rash, itching or hives, swelling of the face, lips, or tongue  breathing problems  changes in   vision  chest pain  confusion, trouble speaking or understanding  feeling faint or lightheaded, falls  high blood pressure  muscle aches or pains  pain, swelling, warmth in the leg  rapid weight gain  severe headaches  sudden numbness or weakness of the face, arm or leg  trouble walking, dizziness, loss of balance or coordination  seizures (convulsions)  swelling of the ankles, feet, hands  unusually weak or  tired Side effects that usually do not require medical attention (report to your doctor or health care professional if they continue or are bothersome):  diarrhea  fever, chills (flu-like symptoms)  headaches  nausea, vomiting  redness, stinging, or swelling at site where injected This list may not describe all possible side effects. Call your doctor for medical advice about side effects. You may report side effects to FDA at 1-800-FDA-1088. Where should I keep my medicine? Keep out of the reach of children. Store in a refrigerator between 2 and 8 degrees C (36 and 46 degrees F). Do not freeze. Do not shake. Throw away any unused portion if using a single-dose vial. Throw away any unused medicine after the expiration date. NOTE: This sheet is a summary. It may not cover all possible information. If you have questions about this medicine, talk to your doctor, pharmacist, or health care provider.  2020 Elsevier/Gold Standard (2017-07-07 16:44:20)  

## 2019-11-28 ENCOUNTER — Emergency Department (HOSPITAL_COMMUNITY)
Admission: EM | Admit: 2019-11-28 | Discharge: 2019-11-28 | Disposition: A | Discharge status: Home / Self Care | Payer: Medicare Other | Attending: Emergency Medicine | Admitting: Emergency Medicine

## 2019-11-28 ENCOUNTER — Encounter (HOSPITAL_COMMUNITY): Payer: Self-pay | Admitting: Emergency Medicine

## 2019-11-28 ENCOUNTER — Emergency Department (HOSPITAL_COMMUNITY): Payer: Medicare Other

## 2019-11-28 ENCOUNTER — Encounter: Payer: Self-pay | Admitting: Hematology and Oncology

## 2019-11-28 DIAGNOSIS — I110 Hypertensive heart disease with heart failure: Secondary | ICD-10-CM | POA: Diagnosis not present

## 2019-11-28 DIAGNOSIS — Z79899 Other long term (current) drug therapy: Secondary | ICD-10-CM | POA: Diagnosis not present

## 2019-11-28 DIAGNOSIS — Z96652 Presence of left artificial knee joint: Secondary | ICD-10-CM | POA: Diagnosis not present

## 2019-11-28 DIAGNOSIS — I5032 Chronic diastolic (congestive) heart failure: Secondary | ICD-10-CM | POA: Diagnosis not present

## 2019-11-28 DIAGNOSIS — R2243 Localized swelling, mass and lump, lower limb, bilateral: Secondary | ICD-10-CM | POA: Diagnosis present

## 2019-11-28 DIAGNOSIS — R600 Localized edema: Secondary | ICD-10-CM | POA: Diagnosis not present

## 2019-11-28 DIAGNOSIS — L299 Pruritus, unspecified: Secondary | ICD-10-CM | POA: Insufficient documentation

## 2019-11-28 DIAGNOSIS — I5033 Acute on chronic diastolic (congestive) heart failure: Secondary | ICD-10-CM | POA: Diagnosis not present

## 2019-11-28 DIAGNOSIS — E876 Hypokalemia: Secondary | ICD-10-CM | POA: Diagnosis not present

## 2019-11-28 DIAGNOSIS — Z87891 Personal history of nicotine dependence: Secondary | ICD-10-CM | POA: Diagnosis not present

## 2019-11-28 LAB — CBC WITH DIFFERENTIAL/PLATELET
Abs Immature Granulocytes: 0.05 10*3/uL (ref 0.00–0.07)
Basophils Absolute: 0.1 10*3/uL (ref 0.0–0.1)
Basophils Relative: 1 %
Eosinophils Absolute: 1.4 10*3/uL — ABNORMAL HIGH (ref 0.0–0.5)
Eosinophils Relative: 16 %
HCT: 30 % — ABNORMAL LOW (ref 36.0–46.0)
Hemoglobin: 8.6 g/dL — ABNORMAL LOW (ref 12.0–15.0)
Immature Granulocytes: 1 %
Lymphocytes Relative: 17 %
Lymphs Abs: 1.5 10*3/uL (ref 0.7–4.0)
MCH: 23.6 pg — ABNORMAL LOW (ref 26.0–34.0)
MCHC: 28.7 g/dL — ABNORMAL LOW (ref 30.0–36.0)
MCV: 82.4 fL (ref 80.0–100.0)
Monocytes Absolute: 1.2 10*3/uL — ABNORMAL HIGH (ref 0.1–1.0)
Monocytes Relative: 14 %
Neutro Abs: 4.4 10*3/uL (ref 1.7–7.7)
Neutrophils Relative %: 51 %
Platelets: 289 10*3/uL (ref 150–400)
RBC: 3.64 MIL/uL — ABNORMAL LOW (ref 3.87–5.11)
RDW: 19 % — ABNORMAL HIGH (ref 11.5–15.5)
WBC: 8.6 10*3/uL (ref 4.0–10.5)
nRBC: 0 % (ref 0.0–0.2)

## 2019-11-28 LAB — COMPREHENSIVE METABOLIC PANEL
ALT: 20 U/L (ref 0–44)
AST: 23 U/L (ref 15–41)
Albumin: 3.2 g/dL — ABNORMAL LOW (ref 3.5–5.0)
Alkaline Phosphatase: 86 U/L (ref 38–126)
Anion gap: 14 (ref 5–15)
BUN: 18 mg/dL (ref 8–23)
CO2: 23 mmol/L (ref 22–32)
Calcium: 8.4 mg/dL — ABNORMAL LOW (ref 8.9–10.3)
Chloride: 102 mmol/L (ref 98–111)
Creatinine, Ser: 1.16 mg/dL — ABNORMAL HIGH (ref 0.44–1.00)
GFR calc Af Amer: 50 mL/min — ABNORMAL LOW (ref >60)
GFR calc non Af Amer: 44 mL/min — ABNORMAL LOW (ref >60)
Glucose, Bld: 117 mg/dL — ABNORMAL HIGH (ref 70–99)
Potassium: 2.9 mmol/L — ABNORMAL LOW (ref 3.5–5.1)
Sodium: 139 mmol/L (ref 135–145)
Total Bilirubin: 0.9 mg/dL (ref 0.3–1.2)
Total Protein: 5.3 g/dL — ABNORMAL LOW (ref 6.5–8.1)

## 2019-11-28 LAB — BRAIN NATRIURETIC PEPTIDE: B Natriuretic Peptide: 47.6 pg/mL (ref 0.0–100.0)

## 2019-11-28 MED ORDER — POTASSIUM CHLORIDE 10 MEQ/100ML IV SOLN
10.0000 meq | INTRAVENOUS | Status: DC
  Administered 2019-11-28: 10 meq via INTRAVENOUS
  Filled 2019-11-28 (×2): qty 100

## 2019-11-28 MED ORDER — POTASSIUM CHLORIDE CRYS ER 20 MEQ PO TBCR
40.0000 meq | EXTENDED_RELEASE_TABLET | Freq: Once | ORAL | Status: AC
  Administered 2019-11-28: 40 meq via ORAL
  Filled 2019-11-28: qty 2

## 2019-11-28 MED ORDER — TORSEMIDE 20 MG PO TABS: 20.0000 mg | ORAL_TABLET | Freq: Every day | ORAL | 0 refills | Status: DC

## 2019-11-28 MED ORDER — PREDNISONE 20 MG PO TABS
40.0000 mg | ORAL_TABLET | Freq: Every day | ORAL | 0 refills | Status: DC
Start: 2019-11-28 — End: 2019-12-07

## 2019-11-28 MED ORDER — DIPHENHYDRAMINE HCL 50 MG/ML IJ SOLN
25.0000 mg | Freq: Once | INTRAMUSCULAR | Status: AC
  Administered 2019-11-28: 25 mg via INTRAVENOUS
  Filled 2019-11-28: qty 1

## 2019-11-28 MED ORDER — FUROSEMIDE 10 MG/ML IJ SOLN
20.0000 mg | Freq: Once | INTRAMUSCULAR | Status: AC
  Administered 2019-11-28: 20 mg via INTRAVENOUS
  Filled 2019-11-28: qty 4

## 2019-11-28 MED ORDER — DEXAMETHASONE SODIUM PHOSPHATE 10 MG/ML IJ SOLN
10.0000 mg | Freq: Once | INTRAMUSCULAR | Status: AC
  Administered 2019-11-28: 10 mg via INTRAVENOUS
  Filled 2019-11-28: qty 1

## 2019-11-28 NOTE — ED Provider Notes (Addendum)
[facility] DEPT Provider Note   CSN: [id number] Arrival date & time: 11/28/19  0825     History Chief Complaint  Patient presents with  . Pruritis    Chelsea Keller is a 85 y.o. female.  HPI    Elderly female with multiple medical issues including CHF, PE, anemia, ongoing therapy with EPO now presents with worsening pruritus and lower extremity edema. She notes that she has had pruritus for months, possibly longer.  She attributes this to an episode of prior infection requiring PICC line placement.  She has had exacerbations frequently, but now presents with extreme itching over the past day, after recently receiving EPO.  This is been persistent in spite of Benadryl, hydroxyzine. She also complains of bilateral lower extremity edema.  She does have a prescription for Lasix, but that she takes it only occasionally, as it is difficult to mobilize to the bathroom. No apparent pain, no report of fever.  She is seemingly taking all other medication as directed, including Eliquis. Past Medical History:  Diagnosis Date  . Allergy   . Anemia   . Anxiety   . Arthritis   . Arthritis of sacroiliac joint of both sides 11/12/2017  . Bilateral pulmonary embolism ([place]) 06/23/2017  . Chronic diastolic CHF (congestive heart failure) (HCC)    a. Echo 1/16:  mild LVH, EF normal, grade 1 DD, MAC  . Chronic venous insufficiency    chronic LE edema  . DDD (degenerative disc disease), lumbar 11/12/2017  . Degenerative joint disease (DJD) of hip, Bilateral  11/12/2017  . Depression   . Fibromyalgia    constant pain  . Hx of cardiac catheterization    a. LHC in [place] "ok" per patient with mild plaque in a single vessel - records not available  . Hx of cardiovascular stress test    a. Nuclear study in 2008 normal  . Hx of colonic polyps   . Hypertension   . Hypertriglyceridemia   . Impaired fasting glucose   . PONV (postoperative nausea and vomiting)   . PUD (peptic  ulcer disease)    hx of gastric ulcer  . Pulmonary emboli ([place]) 06/2017  . Vitamin B12 deficiency     Patient Active Problem List   Diagnosis Date Noted  . Anemia of chronic disease 11/20/2019  . Acquired thrombophilia ([place]) 09/18/2019  . Recurrent pulmonary emboli ([place]) 06/18/2019  . Vertigo 01/12/2019  . Aortic atherosclerosis ([place]) 09/12/2018  . Iron deficiency anemia due to chronic blood loss 08/11/2018  . History of gout 04/01/2018  . Angiodysplasia of colon   . Symptomatic anemia 03/21/2018  . Degenerative joint disease (DJD) of hip, Bilateral  11/12/2017  . DDD (degenerative disc disease), lumbar 11/12/2017  . Arthritis of sacroiliac joint of both sides 11/12/2017  . Chronic anticoagulation 11/07/2017  . Neurodermatitis 07/28/2017  . Urinary incontinence 05/26/2017  . Chronic diastolic CHF (congestive heart failure) ([place]) 04/03/2017  . Hypertension 04/03/2017  . Hypertriglyceridemia 04/03/2017  . Vitamin B12 deficiency 04/03/2017  . CKD (chronic kidney disease) stage 3, GFR 30-59 ml/min 04/03/2017  . Left sided sciatica   . Advance directive discussed with patient 01/19/2017  . Mood disorder ([place]) 11/03/2013  . Psoriasis 02/21/2013  . Routine general medical examination at a health care facility 06/14/2012  . BMI 50.0-59.9, adult ([place]) 12/02/2011  . VENTRICULAR HYPERTROPHY, LEFT 10/19/2008  . Sleep disturbance 10/25/2007  . Venous (peripheral) insufficiency 08/11/2007  . Osteoarthritis, multiple sites 08/11/2007  . Impaired fasting glucose 08/11/2007  .  Hx of adenomatous colonic polyps 05/20/2007  . B12 DEFICIENCY 09/30/2006  . Essential hypertension 09/30/2006  . Allergic rhinitis due to pollen 09/30/2006    Past Surgical History:  Procedure Laterality Date  . ABDOMINAL HYSTERECTOMY    . CATARACT EXTRACTION  10/2003   OD  . CHOLECYSTECTOMY    . COLONOSCOPY W/ POLYPECTOMY    . COLONOSCOPY WITH PROPOFOL N/A 10/15/2014   Procedure: COLONOSCOPY WITH PROPOFOL;   Surgeon: Ladene Artist, MD;  Location: [facility] ENDOSCOPY;  Service: Endoscopy;  Laterality: N/A;  . COLONOSCOPY WITH PROPOFOL N/A 03/23/2018   Procedure: COLONOSCOPY WITH PROPOFOL;  Surgeon: Gatha Mayer, MD;  Location[facility] ENDOSCOPY;  Service: Endoscopy;  Laterality: N/A;  . ESOPHAGOGASTRODUODENOSCOPY (EGD) WITH PROPOFOL N/A 10/15/2014   Procedure: ESOPHAGOGASTRODUODENOSCOPY (EGD) WITH PROPOFOL;  Surgeon: Ladene Artist, MD;  Location: [facility] ENDOSCOPY;  Service: Endoscopy;  Laterality: N/A;  . EXTERNAL FIXATION ANKLE FRACTURE     Fx.  left ankle-fixation with pins later removed sec to infection 1985  . EXTERNAL FIXATION WRIST FRACTURE  1985   left with pins  . EYE SURGERY    . FEMUR FRACTURE SURGERY  06/2009  . FRACTURE SURGERY    . HARDWARE REMOVAL Left 10/05/2013   Procedure: HARDWARE REMOVAL LEFT DISTAL FEMUR;  Surgeon: Rozanna Box, MD;  Location: [facility];  Service: Orthopedics;  Laterality: Left;  . HEMIARTHROPLASTY SHOULDER FRACTURE  06/2009  . HOT HEMOSTASIS N/A 03/23/2018   Procedure: HOT HEMOSTASIS (ARGON PLASMA COAGULATION/BICAP);  Surgeon: Gatha Mayer, MD;  Location: [facility] ENDOSCOPY;  Service: Endoscopy;  Laterality: N/A;  . JOINT REPLACEMENT    . STERIOD INJECTION Right 10/05/2013   Procedure: STEROID INJECTION;  Surgeon: Rozanna Box, MD;  Location: [facility];  Service: Orthopedics;  Laterality: Right;  . TONSILLECTOMY    . TONSILLECTOMY    . TOTAL KNEE ARTHROPLASTY  03/09   left     OB History   No obstetric history on file.     Family History  Problem Relation Age of Onset  . Depression Mother   . Cancer Mother        uterine cancer  . Heart attack Father   . Cancer Brother        prostate cancer  . Diabetes Maternal Aunt   . Arthritis Brother   . Asthma Brother   . Stroke Maternal Grandmother   . Pulmonary embolism Daughter     Social History   Tobacco Use  . Smoking status: Former Smoker    Packs/day: 1.00    Years: 40.00    Pack years: 40.00    Types:  Cigarettes    Quit date: 07/06/1993    Years since quitting: 26.4  . Smokeless tobacco: Never Used  Substance Use Topics  . Alcohol use: No    Alcohol/week: 0.0 standard drinks  . Drug use: No    Home Medications Prior to Admission medications   Medication Sig Start Date End Date Taking? Authorizing Provider  acetaminophen (TYLENOL) 500 MG tablet Take 1,000-1,500 mg by mouth every 6 (six) hours as needed for moderate pain or headache (pain).    Yes [provider]  apixaban (ELIQUIS) 2.5 MG TABS tablet Take 2.5 mg by mouth 2 (two) times daily.    Yes [provider]  furosemide (LASIX) 80 MG tablet Take 1 tablet (80 mg total) by mouth daily. Pt is cutting in half 10/12/19  Yes Viviana Simpler I, MD  gabapentin (NEURONTIN) 300 MG capsule Take 1  capsule in the morning and 2 capsules at bedtime Patient taking differently: Take 300-600 mg by mouth See admin instructions. Take 376m in the morning and 6078mat bedtime 08/21/19  Yes LeVenia CarbonMD  hydrOXYzine (ATARAX/VISTARIL) 10 MG tablet TAKE 1 TABLET BY MOUTH THREE TIMES A DAY AS NEEDED Patient taking differently: Take 10 mg by mouth 3 (three) times daily as needed for itching.  11/06/19  Yes LeVenia CarbonMD  KLOR-CON M20 20 MEQ tablet TAKE 2 TABLETS (40 MEQ TOTAL) DAILY Patient taking differently: Take 40 mEq by mouth daily.  11/24/19  Yes LeVenia CarbonMD  nystatin (NYSTATIN) powder Apply 1 application topically 3 (three) times daily. 09/08/19  Yes Bedsole, Amy E, MD  triamterene-hydrochlorothiazide (MAXZIDE-25) 37.5-25 MG tablet Take 1 tablet by mouth daily. 04/04/19  Yes LeVenia CarbonMD    Allergies    Codeine sulfate, Celecoxib, and Erythromycin base  Review of Systems   Review of Systems  Constitutional:       Per HPI, otherwise negative  HENT:       Per HPI, otherwise negative  Respiratory:       Per HPI, otherwise negative  Cardiovascular:       Per HPI, otherwise negative  Gastrointestinal:  Negative for vomiting.  Endocrine:       Negative aside from HPI  Genitourinary:       Neg aside from HPI   Musculoskeletal:       Per HPI, otherwise negative  Skin: Positive for color change and rash.  Neurological: Negative for syncope.    Physical Exam Updated Vital Signs BP (!) 166/73   Pulse 86   Temp 98.3 F (36.8 C) (Oral)   Resp 15   SpO2 99%   Physical Exam Vitals and nursing note reviewed.  Constitutional:      General: She is not in acute distress.    Appearance: She is well-developed. She is obese.  HENT:     Head: Normocephalic and atraumatic.  Eyes:     Conjunctiva/sclera: Conjunctivae normal.  Cardiovascular:     Rate and Rhythm: Normal rate and regular rhythm.  Pulmonary:     Effort: No respiratory distress.     Breath sounds: Decreased breath sounds present.  Abdominal:     General: There is no distension.  Musculoskeletal:     Right lower leg: Edema present.     Left lower leg: Edema present.  Skin:    General: Skin is warm and dry.     Comments: Patchy erythematous lesions on multiple areas of visible skin, bilateral extremities, bilateral distal upper extremities.  Neurological:     Mental Status: She is alert and oriented to person, place, and time.     Cranial Nerves: No cranial nerve deficit.  Psychiatric:        Mood and Affect: Mood normal.     ED Results / Procedures / Treatments   Labs (all labs ordered are listed, but only abnormal results are displayed) Labs Reviewed  COMPREHENSIVE METABOLIC PANEL - Abnormal; Notable for the following components:      Result Value   Potassium 2.9 (*)    Glucose, Bld 117 (*)    Creatinine, Ser 1.16 (*)    Calcium 8.4 (*)    Total Protein 5.3 (*)    Albumin 3.2 (*)    GFR calc non Af Amer 44 (*)    GFR calc Af Amer 50 (*)    All other components within  normal limits  CBC WITH DIFFERENTIAL/PLATELET - Abnormal; Notable for the following components:   RBC 3.64 (*)    Hemoglobin 8.6 (*)    HCT  30.0 (*)    MCH 23.6 (*)    MCHC 28.7 (*)    RDW 19.0 (*)    Monocytes Absolute 1.2 (*)    Eosinophils Absolute 1.4 (*)    All other components within normal limits  SARS CORONAVIRUS 2 BY RT PCR [facility] ORDER, [place] LAB)  BRAIN NATRIURETIC PEPTIDE    EKG EKG Interpretation  Date/Time:  Tuesday Nov 28 2019 10:31:19 EDT Ventricular Rate:  86 PR Interval:    QRS Duration: 96 QT Interval:  398 QTC Calculation: 476 R Axis:   -11 Text Interpretation: Sinus rhythm Atrial premature complex Low voltage, precordial leads Artifact Abnormal ECG Confirmed by Carmin Muskrat [phone]) on 11/28/2019 10:43:16 AM   Radiology No results found.   Echocardiogram from December, 2020, results below, reviewed:  1. Left ventricular ejection fraction, by visual estimation, is 60 to  65%. The left ventricle has normal function. There is mildly increased  left ventricular hypertrophy.   2. Left ventricular diastolic parameters are consistent with Grade I  diastolic dysfunction (impaired relaxation).   3. The left ventricle has no regional wall motion abnormalities.   4. Global right ventricle has normal systolic function.The right  ventricular size is normal. No increase in right ventricular wall  thickness.   5. Left atrial size was normal.   6. Right atrial size was normal.   7. Mild mitral annular calcification.   8. The mitral valve is abnormal. Mild mitral valve regurgitation.   9. The tricuspid valve is normal in structure. Tricuspid valve  regurgitation is mild.  10. The aortic valve is grossly normal. Aortic valve regurgitation is not  visualized.  11. The pulmonic valve was normal in structure. Pulmonic valve  regurgitation is not visualized.  12. Mildly elevated pulmonary artery systolic pressure.    Procedures Procedures (including critical care time)  Medications Ordered in ED Medications  potassium chloride SA (KLOR-CON) CR tablet 40 mEq (has no  administration in time range)  furosemide (LASIX) injection 20 mg (has no administration in time range)  potassium chloride 10 mEq in 100 mL IVPB (has no administration in time range)  dexamethasone (DECADRON) injection 10 mg (10 mg Intravenous Given 11/28/19 1100)  diphenhydrAMINE (BENADRYL) injection 25 mg (25 mg Intravenous Given 11/28/19 1100)    ED Course  I have reviewed the triage vital signs and the nursing notes.  Pertinent labs & imaging results that were available during my care of the patient were reviewed by me and considered in my medical decision making (see chart for details).   12:09 PM Repeat exam patient is awake and alert.  She has received IV Benadryl, has some improvement in her itchiness.  However, she continues to complain of substantial edema, decreased capacity to complete ADLs secondary to her swelling. She is now accompanied by her daughter, who provides additional details of her history. A lengthy conversation about the patient's labs, including demonstration of persistent anemia, slightly worsened renal function.  We discussed patient's ongoing need for Eliquis, as well as EPO/iron transfusions. Here patient is found to have substantial edema, though BNP is not substantially elevated.  Given review of her last echocardiogram, there are some suspicion for worsening diastolic dysfunction. With this consideration in addition to the patient's demonstrated hypokalemia she will receive both IV Lasix and repletion of  her potassium, oral and IV.    2:32 PM Patient awake, alert, in no distress, has received initial IV potassium, oral as well.  We had a lengthy conversation about the patient's need for continued, consistent dosing of her home diuretic, and with concern for resistance, she will switch to torsemide.  No evidence for decompensated state, no hemodynamic stability, and with improvement here with repletion of potassium, patient will follow up with her physician. I  discussed her case with her hospitalist colleagues, who note that the patient does not meet criteria for admission, though she requested this.  Final Clinical Impression(s) / ED Diagnoses Final diagnoses:  Acute on chronic diastolic congestive heart failure ([place])  Hypokalemia     Carmin Muskrat, MD 11/28/19 1211    Carmin Muskrat, MD 11/28/19 1432

## 2019-11-28 NOTE — ED Notes (Signed)
EDP at bedside  

## 2019-11-28 NOTE — ED Notes (Signed)
Pt does not want to stay for the other 2 runs of K+. Per Vanita Panda MD: Pt does not need to stay for additional runs. Pt to take PO k+ at home.

## 2019-11-28 NOTE — ED Triage Notes (Addendum)
Patient here from home with complaints of itching all over. Reports that she was seen for the same yesterday with no relief. Reports that she is receiving an injection that "increases the red blood cell production" and "makes me itch all over".

## 2019-11-28 NOTE — Discharge Instructions (Signed)
As discussed, today's evaluation has been generally reassuring, though there is evidence for mild low potassium levels and fluid retention.  It is important to take your potassium supplements, and your newly prescribed diuretics. Please schedule follow-up with your physician in the next week or so to ensure continued improvement.  Return here for concerning changes in your condition.

## 2019-11-30 ENCOUNTER — Telehealth: Payer: Self-pay | Admitting: Internal Medicine

## 2019-11-30 NOTE — Telephone Encounter (Signed)
At this point, it will need to be next Thursday If 3PM is not late enough, can add at 4:15

## 2019-11-30 NOTE — Telephone Encounter (Signed)
Patient's Daughter Chelsea Keller today She stated her mom was in the ED on 5/25 and need to schedule a follow up as soon as possible. [name] stated that she will need late afternoons so she is able to bring her into the office,.  Can you look at your schedule and see if what the best day/time would be for this patient?

## 2019-12-07 ENCOUNTER — Other Ambulatory Visit: Payer: Self-pay

## 2019-12-07 ENCOUNTER — Ambulatory Visit (INDEPENDENT_AMBULATORY_CARE_PROVIDER_SITE_OTHER): Payer: Medicare Other | Admitting: Internal Medicine

## 2019-12-07 ENCOUNTER — Encounter: Payer: Self-pay | Admitting: Internal Medicine

## 2019-12-07 VITALS — BP 136/62 | HR 83 | Temp 98.9°F | Ht 62.0 in | Wt 284.0 lb

## 2019-12-07 DIAGNOSIS — L299 Pruritus, unspecified: Secondary | ICD-10-CM

## 2019-12-07 DIAGNOSIS — M1000 Idiopathic gout, unspecified site: Secondary | ICD-10-CM

## 2019-12-07 DIAGNOSIS — I5032 Chronic diastolic (congestive) heart failure: Secondary | ICD-10-CM

## 2019-12-07 DIAGNOSIS — I2699 Other pulmonary embolism without acute cor pulmonale: Secondary | ICD-10-CM | POA: Diagnosis not present

## 2019-12-07 MED ORDER — APIXABAN 2.5 MG PO TABS: 2.5000 mg | ORAL_TABLET | Freq: Two times a day (BID) | ORAL | 3 refills | Status: DC

## 2019-12-07 MED ORDER — GABAPENTIN 300 MG PO CAPS: ORAL_CAPSULE | ORAL | 3 refills | Status: DC

## 2019-12-07 NOTE — Assessment & Plan Note (Signed)
Ongoing fluid overload Weight is up again Discussed the need to take diuretics daily when her weight is up Can try the demadex to see if she tolerates better

## 2019-12-07 NOTE — Patient Instructions (Signed)
Please take either the furosemide or torsemide at the dose that will make you get rid of fluid every day. You can hold the dose if your weight at home is under 260#

## 2019-12-07 NOTE — Progress Notes (Signed)
Subjective:    Patient ID: Chelsea Keller, female    DOB: October 16, 1934, 85 y.o.   MRN: [id number]  HPI Here with daughter [name] for hospital follow up This visit occurred during the SARS-CoV-2 public health emergency.  Safety protocols were in place, including screening questions prior to the visit, additional usage of staff PPE, and extensive cleaning of exam room while observing appropriate contact time as indicated for disinfecting solutions.   To ER after serious reaction to the erythropoietin ER doctor wanted to admit her but the insurance refused Got IV lasix while there----it really worked quickly Still doesn't find the oral lasix that effective (but she mostly has trouble with the prolonged diuretic response with incontinence after dosing)   Still itching and has widespread excoriation  Also with ?gout Swelling, redness of right 3rd PIP (MCP to some degree) Trying the colcyrs  Current Outpatient Medications on File Prior to Visit  Medication Sig Dispense Refill   acetaminophen (TYLENOL) 500 MG tablet Take 1,000-1,500 mg by mouth every 6 (six) hours as needed for moderate pain or headache (pain).      furosemide (LASIX) 80 MG tablet Take 1 tablet (80 mg total) by mouth daily. Pt is cutting in half 30 tablet 11   hydrOXYzine (ATARAX/VISTARIL) 10 MG tablet TAKE 1 TABLET BY MOUTH THREE TIMES A DAY AS NEEDED (Patient taking differently: Take 10 mg by mouth 3 (three) times daily as needed for itching. ) 90 tablet 0   KLOR-CON M20 20 MEQ tablet TAKE 2 TABLETS (40 MEQ TOTAL) DAILY (Patient taking differently: Take 40 mEq by mouth daily. ) 180 tablet 0   nystatin (NYSTATIN) powder Apply 1 application topically 3 (three) times daily. 60 g 1   triamterene-hydrochlorothiazide (MAXZIDE-25) 37.5-25 MG tablet Take 1 tablet by mouth daily. 90 tablet 3   torsemide (DEMADEX) 20 MG tablet Take 1 tablet (20 mg total) by mouth daily. (Patient not taking: Reported on 12/07/2019) 30 tablet 0   No  current facility-administered medications on file prior to visit.    Allergies  Allergen Reactions   Codeine Sulfate Shortness Of Breath   Celecoxib Other (See Comments)    Caused vaginal bleeding   Erythromycin Base Nausea And Vomiting    Past Medical History:  Diagnosis Date   Allergy    Anemia    Anxiety    Arthritis    Arthritis of sacroiliac joint of both sides 11/12/2017   Bilateral pulmonary embolism (HCC) 06/23/2017   Chronic diastolic CHF (congestive heart failure) ([facility])    a. Echo 1/16:  mild LVH, EF normal, grade 1 DD, MAC   Chronic venous insufficiency    chronic LE edema   DDD (degenerative disc disease), lumbar 11/12/2017   Degenerative joint disease (DJD) of hip, Bilateral  11/12/2017   Depression    Fibromyalgia    constant pain   Hx of cardiac catheterization    a. LHC in [place] "ok" per patient with mild plaque in a single vessel - records not available   Hx of cardiovascular stress test    a. Nuclear study in 2008 normal   Hx of colonic polyps    Hypertension    Hypertriglyceridemia    Impaired fasting glucose    PONV (postoperative nausea and vomiting)    PUD (peptic ulcer disease)    hx of gastric ulcer   Pulmonary emboli ([place]) 06/2017   Vitamin B12 deficiency     Past Surgical History:  Procedure Laterality Date   ABDOMINAL HYSTERECTOMY  CATARACT EXTRACTION  10/2003   OD   CHOLECYSTECTOMY     COLONOSCOPY W/ POLYPECTOMY     COLONOSCOPY WITH PROPOFOL N/A 10/15/2014   Procedure: COLONOSCOPY WITH PROPOFOL;  Surgeon: Ladene Artist, MD;  Location: [facility] ENDOSCOPY;  Service: Endoscopy;  Laterality: N/A;   COLONOSCOPY WITH PROPOFOL N/A 03/23/2018   Procedure: COLONOSCOPY WITH PROPOFOL;  Surgeon: Gatha Mayer, MD;  Location: [facility] ENDOSCOPY;  Service: Endoscopy;  Laterality: N/A;   ESOPHAGOGASTRODUODENOSCOPY (EGD) WITH PROPOFOL N/A 10/15/2014   Procedure: ESOPHAGOGASTRODUODENOSCOPY (EGD) WITH PROPOFOL;  Surgeon: Ladene Artist, MD;  [facility] ENDOS[facility];  Service: Endoscopy;  Laterality: N/A;   EXTERNAL FIXATION ANKLE FRACTURE     Fx.  left ankle-fixation with pins later removed sec to infection [place]   left with pins   EYE SURGERY     FEMUR FRACTURE SURGERY  06/2009   FRACTURE SURGERY     HARDWARE REMOVAL Left 10/05/2013   Procedure: HARDWARE REMOVAL LEFT DISTAL FEMUR;  Surgeon: Rozanna Box, MD;  Location: [facility];  Service: Orthopedics;  Laterality: Left;   HEMIARTHROPLASTY SHOULDER FRACTURE  06/2009   HOT HEMOSTASIS N/A 03/23/2018   Procedure: HOT HEMOSTASIS (ARGON PLASMA COAGULATION/BICAP);  Surgeon: Gatha Mayer, MD;  Location: [facility] ENDOSCOPY;  Service: Endoscopy;  Laterality: N/A;   JOINT REPLACEMENT     STERIOD INJECTION Right 10/05/2013   Procedure: STEROID INJECTION;  Surgeon: Rozanna Box, MD;  Location: [facility];  Service: Orthopedics;  Laterality: Right;   TONSILLECTOMY     TONSILLECTOMY     TOTAL KNEE ARTHROPLASTY  03/09   left    Family History  Problem Relation Age of Onset   Depression Mother    Cancer Mother        uterine cancer   Heart attack Father    Cancer Brother        prostate cancer   Diabetes Maternal Aunt    Arthritis Brother    Asthma Brother    Stroke Maternal Grandmother    Pulmonary embolism Daughter     Social History   Socioeconomic History   Marital status: Widowed    Spouse name: Not on file   Number of children: 4   Years of education: Not on file   Highest education level: Not on file  Occupational History   Occupation: retired crossing guard   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  Tobacco Use   Smoking status: Former Smoker    Packs/day: 1.00    Years: 40.00    Pack years: 40.00    Types: Cigarettes    Quit date: 07/06/1993    Years since quitting: 26.4   Smokeless tobacco: Never Used  Substance and Sexual Activity   Alcohol use: No    Alcohol/week: 0.0  standard drinks   Drug use: No   Sexual activity: Not Currently  Other Topics Concern   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 and adult grandsons   Former smoker, no EtOH      No living will   Plans to do health care POA forms---wants daughter [name]   Would accept resuscitation attempts---no prolonged ventilation   Absolutely no feeding tube   Social Determinants of Health   Financial Resource Strain:    Difficulty of Paying Living Expenses:   Food Insecurity:  Worried About Charity fundraiser in the Last Year:    Arboriculturist in the Last Year:   Transportation Needs:    Film/video editor (Medical):    Lack of Transportation (Non-Medical):   Physical Activity:    Days of Exercise per Week:    Minutes of Exercise per Session:   Stress:    Feeling of Stress :   Social Connections:    Frequency of Communication with Friends and Family:    Frequency of Social Gatherings with Friends and Family:    Attends Religious Services:    Active Member of Clubs or Organizations:    Attends Music therapist:    Marital Status:   Intimate Partner Violence:    Fear of Current or Ex-Partner:    Emotionally Abused:    Physically Abused:    Sexually Abused:    Review of Systems Has not been consistent with weighing herself Breathing is some better---but still quick DOE Lower dose of eliquis now    Objective:   Physical Exam  Neck: No thyromegaly present.  Cardiovascular: Normal rate, regular rhythm and normal heart sounds. Exam reveals no gallop.  No murmur heard. Respiratory: Effort normal and breath sounds normal. No respiratory distress. She has no wheezes. She has no rales.  Musculoskeletal:     Comments: 1-2+ tense calf edema Redness/exquisite tenderness of right 3rd PIP  Lymphadenopathy:    She has no cervical adenopathy.             Assessment & Plan:

## 2019-12-07 NOTE — Assessment & Plan Note (Signed)
Now on the lower dose eliquis

## 2019-12-07 NOTE — Assessment & Plan Note (Signed)
Unclear reason No medications helping (prednisone recently) Discussed seeing dermatologist again

## 2019-12-07 NOTE — Assessment & Plan Note (Signed)
With exacerbation Discussed using the colchicine bid

## 2019-12-21 ENCOUNTER — Encounter: Payer: Self-pay | Admitting: Hematology and Oncology

## 2019-12-22 ENCOUNTER — Other Ambulatory Visit: Payer: Self-pay | Admitting: *Deleted

## 2019-12-22 DIAGNOSIS — D638 Anemia in other chronic diseases classified elsewhere: Secondary | ICD-10-CM

## 2019-12-25 ENCOUNTER — Inpatient Hospital Stay: Payer: Medicare Other | Attending: Hematology and Oncology

## 2019-12-25 ENCOUNTER — Other Ambulatory Visit: Payer: Self-pay | Admitting: *Deleted

## 2019-12-25 ENCOUNTER — Other Ambulatory Visit: Payer: Self-pay

## 2019-12-25 ENCOUNTER — Inpatient Hospital Stay: Payer: Medicare Other

## 2019-12-25 DIAGNOSIS — D500 Iron deficiency anemia secondary to blood loss (chronic): Secondary | ICD-10-CM | POA: Insufficient documentation

## 2019-12-25 DIAGNOSIS — K922 Gastrointestinal hemorrhage, unspecified: Secondary | ICD-10-CM | POA: Diagnosis not present

## 2019-12-25 DIAGNOSIS — K648 Other hemorrhoids: Secondary | ICD-10-CM | POA: Insufficient documentation

## 2019-12-25 DIAGNOSIS — D638 Anemia in other chronic diseases classified elsewhere: Secondary | ICD-10-CM

## 2019-12-25 DIAGNOSIS — Z86711 Personal history of pulmonary embolism: Secondary | ICD-10-CM | POA: Insufficient documentation

## 2019-12-25 DIAGNOSIS — D649 Anemia, unspecified: Secondary | ICD-10-CM

## 2019-12-25 DIAGNOSIS — Z79899 Other long term (current) drug therapy: Secondary | ICD-10-CM | POA: Diagnosis not present

## 2019-12-25 DIAGNOSIS — Z7901 Long term (current) use of anticoagulants: Secondary | ICD-10-CM | POA: Diagnosis not present

## 2019-12-25 LAB — CMP (CANCER CENTER ONLY)
ALT: 15 U/L (ref 0–44)
AST: 23 U/L (ref 15–41)
Albumin: 3.1 g/dL — ABNORMAL LOW (ref 3.5–5.0)
Alkaline Phosphatase: 91 U/L (ref 38–126)
Anion gap: 11 (ref 5–15)
BUN: 23 mg/dL (ref 8–23)
CO2: 28 mmol/L (ref 22–32)
Calcium: 8.5 mg/dL — ABNORMAL LOW (ref 8.9–10.3)
Chloride: 104 mmol/L (ref 98–111)
Creatinine: 1.48 mg/dL — ABNORMAL HIGH (ref 0.44–1.00)
GFR, Est AFR Am: 38 mL/min — ABNORMAL LOW (ref >60)
GFR, Estimated: 32 mL/min — ABNORMAL LOW (ref >60)
Glucose, Bld: 96 mg/dL (ref 70–99)
Potassium: 3.4 mmol/L — ABNORMAL LOW (ref 3.5–5.1)
Sodium: 143 mmol/L (ref 135–145)
Total Bilirubin: 1.1 mg/dL (ref 0.3–1.2)
Total Protein: 5.3 g/dL — ABNORMAL LOW (ref 6.5–8.1)

## 2019-12-25 LAB — CBC WITH DIFFERENTIAL (CANCER CENTER ONLY)
Abs Immature Granulocytes: 0.03 10*3/uL (ref 0.00–0.07)
Basophils Absolute: 0.1 10*3/uL (ref 0.0–0.1)
Basophils Relative: 2 %
Eosinophils Absolute: 1.4 10*3/uL — ABNORMAL HIGH (ref 0.0–0.5)
Eosinophils Relative: 21 %
HCT: 27.8 % — ABNORMAL LOW (ref 36.0–46.0)
Hemoglobin: 7.8 g/dL — ABNORMAL LOW (ref 12.0–15.0)
Immature Granulocytes: 0 %
Lymphocytes Relative: 19 %
Lymphs Abs: 1.3 10*3/uL (ref 0.7–4.0)
MCH: 22.2 pg — ABNORMAL LOW (ref 26.0–34.0)
MCHC: 28.1 g/dL — ABNORMAL LOW (ref 30.0–36.0)
MCV: 79.2 fL — ABNORMAL LOW (ref 80.0–100.0)
Monocytes Absolute: 0.8 10*3/uL (ref 0.1–1.0)
Monocytes Relative: 12 %
Neutro Abs: 3.1 10*3/uL (ref 1.7–7.7)
Neutrophils Relative %: 46 %
Platelet Count: 376 10*3/uL (ref 150–400)
RBC: 3.51 MIL/uL — ABNORMAL LOW (ref 3.87–5.11)
RDW: 19.4 % — ABNORMAL HIGH (ref 11.5–15.5)
WBC Count: 6.8 10*3/uL (ref 4.0–10.5)
nRBC: 0 % (ref 0.0–0.2)

## 2019-12-25 LAB — SAMPLE TO BLOOD BANK

## 2019-12-25 LAB — PREPARE RBC (CROSSMATCH)

## 2019-12-25 NOTE — Progress Notes (Signed)
Pt Hgb 7.8 and symptomatic with fatigue and SHOB.  Per MD pt to receive 2 units of PRBC's.  Orders placed and high priority message sent to scheduling.

## 2019-12-27 ENCOUNTER — Other Ambulatory Visit: Payer: Self-pay

## 2019-12-27 ENCOUNTER — Inpatient Hospital Stay: Payer: Medicare Other

## 2019-12-27 DIAGNOSIS — D649 Anemia, unspecified: Secondary | ICD-10-CM

## 2019-12-27 DIAGNOSIS — D500 Iron deficiency anemia secondary to blood loss (chronic): Secondary | ICD-10-CM | POA: Diagnosis not present

## 2019-12-27 MED ORDER — SODIUM CHLORIDE 0.9 % IV SOLN
INTRAVENOUS | Status: DC
  Filled 2019-12-27: qty 250

## 2019-12-27 MED ORDER — DIPHENHYDRAMINE HCL 25 MG PO CAPS
25.0000 mg | ORAL_CAPSULE | Freq: Once | ORAL | Status: AC
  Administered 2019-12-27: 25 mg via ORAL

## 2019-12-27 MED ORDER — ACETAMINOPHEN 325 MG PO TABS
650.0000 mg | ORAL_TABLET | Freq: Once | ORAL | Status: AC
  Administered 2019-12-27: 650 mg via ORAL

## 2019-12-27 MED ORDER — ACETAMINOPHEN 325 MG PO TABS
ORAL_TABLET | ORAL | Status: AC
  Filled 2019-12-27: qty 2

## 2019-12-27 MED ORDER — DIPHENHYDRAMINE HCL 25 MG PO CAPS
ORAL_CAPSULE | ORAL | Status: AC
  Filled 2019-12-27: qty 1

## 2019-12-27 NOTE — Patient Instructions (Signed)

## 2019-12-28 LAB — TYPE AND SCREEN
ABO/RH(D): B POS
Antibody Screen: NEGATIVE
Unit division: 0
Unit division: 0

## 2019-12-28 LAB — BPAM RBC
Blood Product Expiration Date: 202107052359
Blood Product Expiration Date: 202107052359
ISSUE DATE / TIME: 202106230845
ISSUE DATE / TIME: 202106230845
Unit Type and Rh: 7300
Unit Type and Rh: 7300

## 2020-01-03 MED ORDER — TRIAMCINOLONE ACETONIDE 0.025 % EX OINT
1.0000 | TOPICAL_OINTMENT | Freq: Two times a day (BID) | CUTANEOUS | 2 refills | Status: DC
Start: 2020-01-03 — End: 2020-04-25

## 2020-01-09 DIAGNOSIS — R059 Cough, unspecified: Secondary | ICD-10-CM

## 2020-01-19 ENCOUNTER — Other Ambulatory Visit: Payer: Self-pay | Admitting: *Deleted

## 2020-01-19 DIAGNOSIS — D638 Anemia in other chronic diseases classified elsewhere: Secondary | ICD-10-CM

## 2020-01-21 NOTE — Progress Notes (Signed)
Patient Care Team: Venia Carbon, MD as PCP - General Angelena Form Annita Brod, MD as PCP - Cardiology (Cardiology) Sharmon Revere as Physician Assistant (Physician Assistant)  DIAGNOSIS:    ICD-10-CM   1. Anemia of chronic disease  D63.8   2. Iron deficiency anemia due to chronic blood loss  D50.0     CHIEF COMPLIANT: Follow-up of iron deficiency anemia and bilateral PEs  INTERVAL HISTORY: Chelsea Keller is a 85 y.o. with above-mentioned history of iron deficiency anemiapreviously treated with IV ironand bilateral PEs currently on Eliquis. She received 2 units of PRBCs on 12/27/19 after labs on 12/25/19 showed Hg 7.8, HCT 27.8 and she was symptomatic.Shepresents to the clinic today for follow-up. Patient has lots of allergies and therefore she does not want to receive any growth factor injections.  She is having diffuse itching rash throughout her body which is maculopapular in nature.  She has used medications for allergy but apparently she is allergic to allergy medications as well.  At this point we are not going to give her any further IV iron or erythropoietin stimulating agents because of risk of adverse effects.  She is in a wheelchair today brought in by her daughter.  ALLERGIES:  is allergic to codeine sulfate, celecoxib, and erythromycin base.  MEDICATIONS:  Current Outpatient Medications  Medication Sig Dispense Refill  . acetaminophen (TYLENOL) 500 MG tablet Take 1,000-1,500 mg by mouth every 6 (six) hours as needed for moderate pain or headache (pain).     Marland Kitchen apixaban (ELIQUIS) 2.5 MG TABS tablet Take 1 tablet (2.5 mg total) by mouth 2 (two) times daily. 180 tablet 3  . furosemide (LASIX) 80 MG tablet Take 1 tablet (80 mg total) by mouth daily. Pt is cutting in half 30 tablet 11  . gabapentin (NEURONTIN) 300 MG capsule Take 1 capsule in the morning and 2 capsules at bedtime 270 capsule 3  . hydrOXYzine (ATARAX/VISTARIL) 10 MG tablet TAKE 1 TABLET BY MOUTH THREE  TIMES A DAY AS NEEDED (Patient taking differently: Take 10 mg by mouth 3 (three) times daily as needed for itching. ) 90 tablet 0  . KLOR-CON M20 20 MEQ tablet TAKE 2 TABLETS (40 MEQ TOTAL) DAILY (Patient taking differently: Take 40 mEq by mouth daily. ) 180 tablet 0  . nystatin (NYSTATIN) powder Apply 1 application topically 3 (three) times daily. 60 g 1  . torsemide (DEMADEX) 20 MG tablet Take 1 tablet (20 mg total) by mouth daily. (Patient not taking: Reported on 12/07/2019) 30 tablet 0  . triamcinolone (KENALOG) 0.025 % ointment Apply 1 application topically 2 (two) times daily. 80 g 2  . triamterene-hydrochlorothiazide (MAXZIDE-25) 37.5-25 MG tablet Take 1 tablet by mouth daily. 90 tablet 3   No current facility-administered medications for this visit.    PHYSICAL EXAMINATION: ECOG PERFORMANCE STATUS: 1 - Symptomatic but completely ambulatory  Vitals:   01/22/20 1340  BP: (!) 166/64  Pulse: 81  Resp: 20  Temp: 98.7 F (37.1 C)  SpO2: 98%   Filed Weights   01/22/20 1340  Weight: 278 lb (126.1 kg)    LABORATORY DATA:  I have reviewed the data as listed CMP Latest Ref Rng & Units 12/25/2019 11/28/2019 11/27/2019  Glucose 70 - 99 mg/dL 96 117(H) 113(H)  BUN 8 - 23 mg/dL 23 18 19   Creatinine 0.44 - 1.00 mg/dL 1.48(H) 1.16(H) 1.16(H)  Sodium 135 - 145 mmol/L 143 139 144  Potassium 3.5 - 5.1 mmol/L 3.4(L) 2.9(L) 3.2(L)  Chloride  98 - 111 mmol/L 104 102 105  CO2 22 - 32 mmol/L 28 23 29   Calcium 8.9 - 10.3 mg/dL 8.5(L) 8.4(L) 8.6(L)  Total Protein 6.5 - 8.1 g/dL 5.3(L) 5.3(L) 5.2(L)  Total Bilirubin 0.3 - 1.2 mg/dL 1.1 0.9 1.0  Alkaline Phos 38 - 126 U/L 91 86 99  AST 15 - 41 U/L 23 23 21   ALT 0 - 44 U/L 15 20 18     Lab Results  Component Value Date   WBC 7.2 01/22/2020   HGB 9.7 (L) 01/22/2020   HCT 32.7 (L) 01/22/2020   MCV 83.2 01/22/2020   PLT 316 01/22/2020   NEUTROABS 3.6 01/22/2020    ASSESSMENT & PLAN:  Iron deficiency anemia due to chronic blood  loss Hospitalization 08/11/2018: Severe GI bleed hemoglobin 4.9(internal hemorrhoids and angiodysplasia of the colon)Received blood transfusions IV iron treatment: September 2019, March 2020, Injectafer June 2020, Venofer January 2021  Toxicities of iron infusion:Patient gets profound diarrhea which last for 2 to 3 days afterFeraheme (and prescription for Lomotil for 3 days).With Venofer she did okay but she still had profound diarrhea as well as lethargic  10/10/2019: Hemoglobin 6.8: MCV 82, RDW 18: 2 units of PRBC given on 10/11/2019. 11/03/2019: Hemoglobin 8.1 (iron studies and erythropoietin levels are pending) 11/20/2019: Hemoglobin 7.1, MCV 81.2 Iron studies ferritin 7, iron saturation 6%, absolute reticulocyte count 88.3  Blood transfusions: 11/29/2019, 01/02/2020  Because of her severe intolerance with the IV iron we are unable to do so.  She will continue with oral iron. Current treatment: Blood transfusions as needed We might consider blood transfusions if the hemoglobin drops below 7 or if she were to become symptomatic. She has required blood transfusions every month  Return to clinic in 2 months with labs and follow-up  No orders of the defined types were placed in this encounter.  The patient has a good understanding of the overall plan. she agrees with it. she will call with any problems that may develop before the next visit here.  Total time spent: 30 mins including face to face time and time spent for planning, charting and coordination of care  Nicholas Lose, MD 01/22/2020  I, Cloyde Reams Dorshimer, am acting as scribe for Dr. Nicholas Lose.  I have reviewed the above documentation for accuracy and completeness, and I agree with the above.

## 2020-01-22 ENCOUNTER — Inpatient Hospital Stay: Payer: Medicare Other

## 2020-01-22 ENCOUNTER — Telehealth: Payer: Self-pay

## 2020-01-22 ENCOUNTER — Other Ambulatory Visit: Payer: Self-pay

## 2020-01-22 ENCOUNTER — Inpatient Hospital Stay (HOSPITAL_BASED_OUTPATIENT_CLINIC_OR_DEPARTMENT_OTHER): Payer: Medicare Other | Admitting: Hematology and Oncology

## 2020-01-22 ENCOUNTER — Inpatient Hospital Stay: Payer: Medicare Other | Attending: Hematology and Oncology

## 2020-01-22 VITALS — BP 166/64 | HR 81 | Temp 98.7°F | Resp 20 | Ht 62.0 in | Wt 278.0 lb

## 2020-01-22 DIAGNOSIS — R197 Diarrhea, unspecified: Secondary | ICD-10-CM | POA: Insufficient documentation

## 2020-01-22 DIAGNOSIS — K922 Gastrointestinal hemorrhage, unspecified: Secondary | ICD-10-CM | POA: Insufficient documentation

## 2020-01-22 DIAGNOSIS — D500 Iron deficiency anemia secondary to blood loss (chronic): Secondary | ICD-10-CM

## 2020-01-22 DIAGNOSIS — K648 Other hemorrhoids: Secondary | ICD-10-CM | POA: Diagnosis not present

## 2020-01-22 DIAGNOSIS — R5383 Other fatigue: Secondary | ICD-10-CM | POA: Insufficient documentation

## 2020-01-22 DIAGNOSIS — D638 Anemia in other chronic diseases classified elsewhere: Secondary | ICD-10-CM

## 2020-01-22 DIAGNOSIS — Z79899 Other long term (current) drug therapy: Secondary | ICD-10-CM | POA: Insufficient documentation

## 2020-01-22 DIAGNOSIS — Z7901 Long term (current) use of anticoagulants: Secondary | ICD-10-CM | POA: Diagnosis not present

## 2020-01-22 DIAGNOSIS — R21 Rash and other nonspecific skin eruption: Secondary | ICD-10-CM | POA: Insufficient documentation

## 2020-01-22 DIAGNOSIS — Z885 Allergy status to narcotic agent status: Secondary | ICD-10-CM | POA: Insufficient documentation

## 2020-01-22 DIAGNOSIS — Z881 Allergy status to other antibiotic agents status: Secondary | ICD-10-CM | POA: Insufficient documentation

## 2020-01-22 LAB — CBC WITH DIFFERENTIAL (CANCER CENTER ONLY)
Abs Immature Granulocytes: 0.03 10*3/uL (ref 0.00–0.07)
Basophils Absolute: 0.1 10*3/uL (ref 0.0–0.1)
Basophils Relative: 1 %
Eosinophils Absolute: 1.5 10*3/uL — ABNORMAL HIGH (ref 0.0–0.5)
Eosinophils Relative: 20 %
HCT: 32.7 % — ABNORMAL LOW (ref 36.0–46.0)
Hemoglobin: 9.7 g/dL — ABNORMAL LOW (ref 12.0–15.0)
Immature Granulocytes: 0 %
Lymphocytes Relative: 16 %
Lymphs Abs: 1.1 10*3/uL (ref 0.7–4.0)
MCH: 24.7 pg — ABNORMAL LOW (ref 26.0–34.0)
MCHC: 29.7 g/dL — ABNORMAL LOW (ref 30.0–36.0)
MCV: 83.2 fL (ref 80.0–100.0)
Monocytes Absolute: 0.9 10*3/uL (ref 0.1–1.0)
Monocytes Relative: 12 %
Neutro Abs: 3.6 10*3/uL (ref 1.7–7.7)
Neutrophils Relative %: 51 %
Platelet Count: 316 10*3/uL (ref 150–400)
RBC: 3.93 MIL/uL (ref 3.87–5.11)
RDW: 21.2 % — ABNORMAL HIGH (ref 11.5–15.5)
WBC Count: 7.2 10*3/uL (ref 4.0–10.5)
nRBC: 0 % (ref 0.0–0.2)

## 2020-01-22 LAB — CMP (CANCER CENTER ONLY)
ALT: 14 U/L (ref 0–44)
AST: 28 U/L (ref 15–41)
Albumin: 3.2 g/dL — ABNORMAL LOW (ref 3.5–5.0)
Alkaline Phosphatase: 96 U/L (ref 38–126)
Anion gap: 8 (ref 5–15)
BUN: 16 mg/dL (ref 8–23)
CO2: 26 mmol/L (ref 22–32)
Calcium: 9.6 mg/dL (ref 8.9–10.3)
Chloride: 108 mmol/L (ref 98–111)
Creatinine: 1.09 mg/dL — ABNORMAL HIGH (ref 0.44–1.00)
GFR, Est AFR Am: 54 mL/min — ABNORMAL LOW (ref >60)
GFR, Estimated: 47 mL/min — ABNORMAL LOW (ref >60)
Glucose, Bld: 103 mg/dL — ABNORMAL HIGH (ref 70–99)
Potassium: 3.8 mmol/L (ref 3.5–5.1)
Sodium: 142 mmol/L (ref 135–145)
Total Bilirubin: 0.8 mg/dL (ref 0.3–1.2)
Total Protein: 5.8 g/dL — ABNORMAL LOW (ref 6.5–8.1)

## 2020-01-22 LAB — SAMPLE TO BLOOD BANK

## 2020-01-22 NOTE — Assessment & Plan Note (Addendum)
Hospitalization 08/11/2018: Severe GI bleed hemoglobin 4.9(internal hemorrhoids and angiodysplasia of the colon)Received blood transfusions IV iron treatment: September 2019, March 2020, Injectafer June 2020, Venofer January 2021  Toxicities of iron infusion:Patient gets profound diarrhea which last for 2 to 3 days afterFeraheme (and prescription for Lomotil for 3 days).With Venofer she did okay but she still had profound diarrhea as well as lethargic  10/10/2019: Hemoglobin 6.8: MCV 82, RDW 18: 2 units of PRBC given on 10/11/2019. 11/03/2019: Hemoglobin 8.1 (iron studies and erythropoietin levels are pending) 11/20/2019: Hemoglobin 7.1, MCV 81.2 Iron studies ferritin 7, iron saturation 6%, absolute reticulocyte count 88.3  Blood transfusions: 11/29/2019, 01/02/2020  Because of her severe intolerance with the IV iron we are unable to do so.  She will continue with oral iron. Current treatment: Aranesp therapy for anemia of chronic disease started 11/27/2019

## 2020-01-22 NOTE — Telephone Encounter (Signed)
PROGRESS NOTE: Per Dr Lindi Adie no injection needed today

## 2020-01-24 ENCOUNTER — Telehealth: Payer: Self-pay | Admitting: Hematology and Oncology

## 2020-01-24 NOTE — Telephone Encounter (Signed)
Scheduled per 7/19 los. Called and left a msg, mailing appt letter and calendar printout.  Confirmed PRBC time with nursing

## 2020-02-14 ENCOUNTER — Other Ambulatory Visit: Payer: Self-pay | Admitting: Internal Medicine

## 2020-02-16 ENCOUNTER — Other Ambulatory Visit: Payer: Self-pay | Admitting: Orthopedic Surgery

## 2020-02-16 ENCOUNTER — Institutional Professional Consult (permissible substitution): Payer: Medicare Other | Admitting: Pulmonary Disease

## 2020-02-16 DIAGNOSIS — M1612 Unilateral primary osteoarthritis, left hip: Secondary | ICD-10-CM

## 2020-02-16 DIAGNOSIS — M1611 Unilateral primary osteoarthritis, right hip: Secondary | ICD-10-CM

## 2020-02-19 ENCOUNTER — Ambulatory Visit: Payer: Medicare Other

## 2020-02-19 ENCOUNTER — Other Ambulatory Visit: Payer: Medicare Other

## 2020-02-21 ENCOUNTER — Ambulatory Visit
Admission: RE | Admit: 2020-02-21 | Discharge: 2020-02-21 | Disposition: A | Discharge status: Home / Self Care | Payer: Medicare Other | Source: Ambulatory Visit | Attending: Orthopedic Surgery | Admitting: Orthopedic Surgery

## 2020-02-21 DIAGNOSIS — M1612 Unilateral primary osteoarthritis, left hip: Secondary | ICD-10-CM | POA: Diagnosis not present

## 2020-02-21 MED ORDER — METHYLPREDNISOLONE ACETATE 40 MG/ML INJ SUSP (RADIOLOG
120.0000 mg | Freq: Once | INTRAMUSCULAR | Status: AC
  Administered 2020-02-21: 120 mg via EPIDURAL

## 2020-02-21 MED ORDER — IOPAMIDOL (ISOVUE-M 200) INJECTION 41%
1.0000 mL | Freq: Once | INTRAMUSCULAR | Status: AC
  Administered 2020-02-21: 1 mL via INTRA_ARTICULAR

## 2020-02-22 ENCOUNTER — Other Ambulatory Visit: Payer: Self-pay | Admitting: Internal Medicine

## 2020-02-22 ENCOUNTER — Encounter: Payer: Self-pay | Admitting: Internal Medicine

## 2020-02-22 ENCOUNTER — Ambulatory Visit (INDEPENDENT_AMBULATORY_CARE_PROVIDER_SITE_OTHER): Payer: Medicare Other | Admitting: Internal Medicine

## 2020-02-22 ENCOUNTER — Other Ambulatory Visit: Payer: Self-pay

## 2020-02-22 DIAGNOSIS — L97901 Non-pressure chronic ulcer of unspecified part of unspecified lower leg limited to breakdown of skin: Secondary | ICD-10-CM | POA: Diagnosis not present

## 2020-02-22 DIAGNOSIS — I872 Venous insufficiency (chronic) (peripheral): Secondary | ICD-10-CM

## 2020-02-22 DIAGNOSIS — L97909 Non-pressure chronic ulcer of unspecified part of unspecified lower leg with unspecified severity: Secondary | ICD-10-CM | POA: Insufficient documentation

## 2020-02-22 MED ORDER — SPIRONOLACTONE 25 MG PO TABS: 25.0000 mg | ORAL_TABLET | Freq: Every day | ORAL | 11 refills | Status: DC

## 2020-02-22 MED ORDER — TORSEMIDE 20 MG PO TABS: 20.0000 mg | ORAL_TABLET | Freq: Every day | ORAL | 11 refills | Status: DC

## 2020-02-22 NOTE — Patient Instructions (Signed)
Please stop the furosemide and potassium Start the torsemide 20mg  daily and spironolactone 25mg  daily

## 2020-02-22 NOTE — Progress Notes (Signed)
Subjective:    Patient ID: Chelsea Keller, female    DOB: [birth year], 85 y.o.   MRN: [id number]  HPI Here with daughter for ongoing leg problems This visit occurred during the SARS-CoV-2 public health emergency.  Safety protocols were in place, including screening questions prior to the visit, additional usage of staff PPE, and extensive cleaning of exam room while observing appropriate contact time as indicated for disinfecting solutions.   Ongoing leg swelling Weeping quite a bit--for a month Needs incontinence pad to keep bed from getting wet Now with open area on right calf for 5 days  Was quite limited walking --so not taking the furosemide Had shot done yesterday--this has helped Breathing is okay  Current Outpatient Medications on File Prior to Visit  Medication Sig Dispense Refill  . acetaminophen (TYLENOL) 500 MG tablet Take 1,000-1,500 mg by mouth every 6 (six) hours as needed for moderate pain or headache (pain).     Marland Kitchen apixaban (ELIQUIS) 2.5 MG TABS tablet Take 1 tablet (2.5 mg total) by mouth 2 (two) times daily. 180 tablet 3  . furosemide (LASIX) 80 MG tablet Take 1 tablet (80 mg total) by mouth daily. Pt is cutting in half 30 tablet 11  . gabapentin (NEURONTIN) 300 MG capsule Take 1 capsule in the morning and 2 capsules at bedtime 270 capsule 3  . hydrOXYzine (ATARAX/VISTARIL) 10 MG tablet TAKE 1 TABLET BY MOUTH THREE TIMES A DAY AS NEEDED 90 tablet 0  . nystatin (NYSTATIN) powder Apply 1 application topically 3 (three) times daily. 60 g 1  . torsemide (DEMADEX) 20 MG tablet Take 1 tablet (20 mg total) by mouth daily. 30 tablet 0  . triamcinolone (KENALOG) 0.025 % ointment Apply 1 application topically 2 (two) times daily. 80 g 2  . triamterene-hydrochlorothiazide (MAXZIDE-25) 37.5-25 MG tablet Take 1 tablet by mouth daily. 90 tablet 3   No current facility-administered medications on file prior to visit.    Allergies  Allergen Reactions  . Codeine Sulfate Shortness  Of Breath  . Celecoxib Other (See Comments)    Caused vaginal bleeding  . Erythromycin Base Nausea And Vomiting    Past Medical History:  Diagnosis Date  . Allergy   . Anemia   . Anxiety   . Arthritis   . Arthritis of sacroiliac joint of both sides 11/12/2017  . Bilateral pulmonary embolism ([facility]) 06/23/2017  . Chronic diastolic CHF (congestive heart failure) (H[facility])    a. Echo 1/16:  mild LVH, EF normal, grade 1 DD, MAC  . Chronic venous insufficiency    chronic LE edema  . DDD (degenerative disc disease), lumbar 11/12/2017  . Degenerative joint disease (DJD) of hip, Bilateral  11/12/2017  . Depression   . Fibromyalgia    constant pain  . Hx of cardiac catheterization    a. LHC in [place] "ok" per patient with mild plaque in a single vessel - records not available  . Hx of cardiovascular stress test    a. Nuclear study in 2008 normal  . Hx of colonic polyps   . Hypertension   . Hypertriglyceridemia   . Impaired fasting glucose   . PONV (postoperative nausea and vomiting)   . PUD (peptic ulcer disease)    hx of gastric ulcer  . Pulmonary emboli ([facility]) 06/2017  . Vitamin B12 deficiency     Past Surgical History:  Procedure Laterality Date  . ABDOMINAL HYSTERECTOMY    . CATARACT EXTRACTION  10/2003   OD  . CHOLECYSTECTOMY    .  COLONOSCOPY W/ POLYPECTOMY    . COLONOSCOPY WITH PROPOFOL N/A 10/15/2014   Procedure: COLONOSCOPY WITH PROPOFOL;  Surgeon: Ladene Artist, MD;  Location: [facility] ENDOSCOPY;  Service: Endoscopy;  Laterality: N/A;  . COLONOSCOPY WITH PROPOFOL N/A 03/23/2018   Procedure: COLONOSCOPY WITH PROPOFOL;  Surgeon: Gatha Mayer, MD;  Location: [facility] ENDOSCOPY;  Service: Endoscopy;  Laterality: N/A;  . ESOPHAGOGASTRODUODENOSCOPY (EGD) WITH PROPOFOL N/A 10/15/2014   Procedure: ESOPHAGOGASTRODUODENOSCOPY (EGD) WITH PROPOFOL;  Surgeon: Ladene Artist, MD;  Location: [facility] ENDOSCOPY;  Service: Endoscopy;  Laterality: N/A;  . EXTERNAL FIXATION ANKLE FRACTURE     Fx.  left  ankle-fixation with pins later removed sec to infection 1985  . EXTERNAL FIXATION WRIST FRACTURE  1985   left with pins  . EYE SURGERY    . FEMUR FRACTURE SURGERY  06/2009  . FRACTURE SURGERY    . HARDWARE REMOVAL Left 10/05/2013   Procedure: HARDWARE REMOVAL LEFT DISTAL FEMUR;  Surgeon: Rozanna Box, MD;  Location: [place];  Service: Orthopedics;  Laterality: Left;  . HEMIARTHROPLASTY SHOULDER FRACTURE  06/2009  . HOT HEMOSTASIS N/A 03/23/2018   Procedure: HOT HEMOSTASIS (ARGON PLASMA COAGULATION/BICAP);  Surgeon: Gatha Mayer, MD;  Location: [facility] ENDOSCOPY;  Service: Endoscopy;  Laterality: N/A;  . JOINT REPLACEMENT    . STERIOD INJECTION Right 10/05/2013   Procedure: STEROID INJECTION;  Surgeon: Rozanna Box, MD;  Location: [facility];  Service: Orthopedics;  Laterality: Right;  . TONSILLECTOMY    . TONSILLECTOMY    . TOTAL KNEE ARTHROPLASTY  03/09   left    Family History  Problem Relation Age of Onset  . Depression Mother   . Cancer Mother        uterine cancer  . Heart attack Father   . Cancer Brother        prostate cancer  . Diabetes Maternal Aunt   . Arthritis Brother   . Asthma Brother   . Stroke Maternal Grandmother   . Pulmonary embolism Daughter     Social History   Socioeconomic History  . Marital status: Widowed    Spouse name: Not on file  . Number of children: 4  . Years of education: Not on file  . Highest education level: Not on file  Occupational History  . Occupation: retired crossing guard  . 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 Smoking status: Former Smoker    Packs/day: 1.00    Years: 40.00    Pack years: 40.00    Types: Cigarettes    Quit date: 07/06/1993    Years since quitting: 26.6  . Smokeless tobacco: Never Used  Vaping Use  . Vaping Use: Never used  Substance and Sexual Activity  . Alcohol use: No    Alcohol/week: 0.0 standard drinks  . Drug use: No  . Sexual activity: Not Currently  Other Topics Concern  .  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 Difficulty of Paying Living Expenses: Not on file  Food Insecurity:   . Worried About Running  Out of Food in the Last Year: Not on file  . Ran Out of Food in the Last Year: Not on file  Transportation Needs:   . Lack of Transportation (Medical): Not on file  . Lack of Transportation (Non-Medical): Not on file  Physical Activity:   . Days of Exercise per Week: Not on file  . Minutes of Exercise per Session: Not on file  Stress:   . Feeling of Stress : Not on file  Social Connections:   . Frequency of Communication with Friends and Family: Not on file  . Frequency of Social Gatherings with Friends and Family: Not on file  . Attends Religious Services: Not on file  . Active Member of Clubs or Organizations: Not on file  . Attends Archivist Meetings: Not on file  . Marital Status: Not on file  Intimate Partner Violence:   . Fear of Current or Ex-Partner: Not on file  . Emotionally Abused: Not on file  . Physically Abused: Not on file  . Sexually Abused: Not on file   Review of Systems No fever Hasn't been sick    Objective:   Physical Exam Cardiovascular:     Rate and Rhythm: Normal rate and regular rhythm.     Heart sounds: No murmur heard.  No gallop.   Pulmonary:     Effort: Pulmonary effort is normal.     Breath sounds: Normal breath sounds. No wheezing or rales.  Musculoskeletal:     Comments: 3+ tense calf edema  Skin:    Comments: Superficial non inflamed right calf ulcers--- 20 x 12m and ~81mabove it Some weeping  Neurological:     Mental  Status: She is alert.            Assessment & Plan:

## 2020-02-22 NOTE — Assessment & Plan Note (Signed)
She will start silvadene daily for this Need to work on diuresis still--though weight is down

## 2020-02-22 NOTE — Assessment & Plan Note (Signed)
Doesn't like furosemide---will try torsemide and low dose spironolactone

## 2020-03-04 ENCOUNTER — Telehealth: Payer: Self-pay

## 2020-03-04 NOTE — Telephone Encounter (Signed)
Patient's daughter called in and scheduled appointment for virtual visit with PCP, as patient cannot stand or come into office due to not being able to ambulate or even pivot to use bedside commode. Patient's daughter is wondering if a round of prednisone for 7 days can be prescribed to start treatment for pain. Please advise.

## 2020-03-04 NOTE — Telephone Encounter (Signed)
[facility] Night - Client TELEPHONE ADVICE RECORD [facility] Patient Name: Chelsea Keller Gender: Female DOB: Oct 19, 1934 Age: 85 Y 94 M 5 D Return Phone Number: [phone] (Primary) Address: City/State/Zip: [place] [address] Client [facility] Night - Client Client [place] Physician Viviana Simpler- MD Contact Type Call Who Is Calling Patient / Member / Family / Caregiver Call Type Triage / Clinical Caller Name Chelsea Keller Relationship To Patient Daughter Return Phone Number [phone] (Primary) Chief Complaint Leg Pain Reason for Call Symptomatic / Request for Health Information Initial Comment Per Daughter - Pt is having some sciatic pain and is not able to stand. Translation No Nurse Assessment Nurse: Chelsea Salt, RN, Chelsea Keller Date/Time Eilene Ghazi Time): 03/02/2020 8:39:04 PM Confirm and document reason for call. If symptomatic, describe symptoms. ---Caller states her mother is having some left sciatic pain and is not able to stand. Right knee is "shot" and has left hip pain. Has the patient had close contact with a person known or suspected to have the novel coronavirus illness OR traveled / lives in area with major community spread (including international travel) in the last 14 days from the onset of symptoms? * If Asymptomatic, screen for exposure and travel within the last 14 days. ---No Does the patient have any new or worsening symptoms? ---Yes Will a triage be completed? ---Yes Related visit to physician within the last 2 weeks? ---Yes Does the PT have any chronic conditions? (i.e. diabetes, asthma, this includes High risk factors for pregnancy, etc.) ---Yes List chronic conditions. ---sciatica (left). She had an open sore on right leg. She has hypertension, CHF Is this a behavioral health or substance abuse call? ---No Guidelines Guideline Title Affirmed Question Affirmed Notes  Nurse Date/Time (Eastern Time) Back Pain [1] SEVERE back pain (e.g., excruciating) AND [2] sudden onset AND [3] age [place] 59 years Chelsea Keller 03/02/2020 8:42:34 PM PLEASE NOTE: All timestamps contained within this report are represented as [place] Standard Time. CONFIDENTIALTY NOTICE: This fax transmission is intended only for the addressee. It contains information that is legally privileged, confidential or otherwise protected from use or disclosure. If you are not the intended recipient, you are strictly prohibited from reviewing, disclosing, copying using or disseminating any of this information or taking any action in reliance on or regarding this information. If you have received this fax in error, please notify us immediately by telephone so that we can arrange for its return to [place]. Phone: [phone], Toll-Free: [phone], Fax: [phone] Page: 2 of 2 Call Id: [id number] Tat Momoli. Time Eilene Ghazi Time) Disposition Final User 03/02/2020 9:03:37 PM Called On-Call Provider [facility] [place], RN, Chelsea Keller 03/02/2020 9:08:30 PM Pharmacy Call Chelsea Salt, RN, Chelsea Keller Reason: Verbal order from Dr Chelsea Keller; [place] ; [phone] for: Prednisone 50 mg PO daily. Take first tonight, Dispense #3. No refills. [facility] [address] [place], [place] Phone: [phone] 03/02/2020 8:58:58 PM Go to ED Now Yes Chelsea Salt, RN, [facility] Speed Disagree/Comply Disagree Caller Understands Yes PreDisposition Call Doctor Care Advice Given Per Guideline GO TO ED NOW: CARE ADVICE given per Back Pain (Adult) guideline. Comments User: Chelsea Ditty, RN Date/Time Eilene Ghazi Time): 03/02/2020 8:57:59 PM Cannot take Celebrex, No codiene but can take codones [facility] [address] [place], [place] Phone: [phone] Referrals [facility] - ED Paging DoctorName Phone DateTime Result/Outcome Message Type Notes Chelsea Keller- MD [phone] 03/02/2020 9:03:37 PM Called On  Call Provider - Reached Doctor  Paged Chelsea Keller- MD 03/02/2020 9:06:27 PM Spoke with On Call - General Message Result Verbal order from Dr Chelsea Keller; [place] ; [phone] for: Prednisone 50 mg PO daily. Take first tonight, Dispense #3. No refills. [facility] [address] [place], [place] Phone: [phone]

## 2020-03-04 NOTE — Telephone Encounter (Signed)
Not without being seen.

## 2020-03-04 NOTE — Telephone Encounter (Signed)
Try to reach, her see if she wants to schedule appt with PCP

## 2020-03-04 NOTE — Telephone Encounter (Signed)
Unable to reach pts daughter;sending note to Avie Echevaria NP and [facility] CMA who is in office.

## 2020-03-05 NOTE — Telephone Encounter (Signed)
Contacted pt's daughter, [name], who reports pt is feeling a little better today after taking three days of 50 mg prednisone that was prescribed on 8/28. [name] reports she just wants a long term plan for her mother because her mother suffers from this repeatedly and when she does she does not want to live. [name] reports the pt has used step down dosing prednisone in the past and it has always helped and she would like to have that on hand in the future for the pt when it happens again. Maudry Mayhew the apt for tomorrow may need to be in office and that a msg would be sent to PCP and this office would f/u. [name] said she could bring pt to office if needed but it would be easier on the pt if it was a VV. Advised this office will f/u. Cindy verbalized understanding.

## 2020-03-06 ENCOUNTER — Other Ambulatory Visit: Payer: Self-pay

## 2020-03-06 ENCOUNTER — Telehealth: Payer: Medicare Other | Admitting: Internal Medicine

## 2020-03-06 MED ORDER — TRAMADOL HCL 50 MG PO TABS
50.0000 mg | ORAL_TABLET | Freq: Three times a day (TID) | ORAL | 0 refills | Status: DC | PRN
Start: 2020-03-06 — End: 2020-04-25

## 2020-03-06 NOTE — Addendum Note (Signed)
Addended by: Viviana Simpler I on: 03/06/2020 01:12 PM   Modules accepted: Orders

## 2020-03-06 NOTE — Telephone Encounter (Signed)
Contacted pt's daughter to verify if she was going to make VV today. She reports she has diabetic children in school today and will not be able to made the VV. She reports pt has OV on 9/13 and she will just wait until then to talk to PCP but she is requesting a refill of tramadol for pt. Advised a msg would be sent requesting refill. Advised if anything is needed before apt to contact office. Cindy verbalized understanding.   VV for today has been cancelled   Name of Medication: tramadol [facility] or Written Date and Quantity:  10/24/18, #60 Last Office Visit and Type: 02/22/20 Next Office Visit and Type: 03/18/20 Last Controlled Substance Agreement Date: 08/28/13 Last UDS:

## 2020-03-06 NOTE — Telephone Encounter (Signed)
Okay to leave it as a Hospital doctor

## 2020-03-07 ENCOUNTER — Ambulatory Visit: Payer: Medicare Other | Admitting: Internal Medicine

## 2020-03-18 ENCOUNTER — Encounter: Payer: Self-pay | Admitting: Internal Medicine

## 2020-03-18 ENCOUNTER — Other Ambulatory Visit: Payer: Self-pay

## 2020-03-18 ENCOUNTER — Ambulatory Visit (INDEPENDENT_AMBULATORY_CARE_PROVIDER_SITE_OTHER): Payer: Medicare Other | Admitting: Internal Medicine

## 2020-03-18 VITALS — BP 124/68 | HR 83 | Temp 97.6°F | Ht 62.0 in | Wt 257.0 lb

## 2020-03-18 DIAGNOSIS — D638 Anemia in other chronic diseases classified elsewhere: Secondary | ICD-10-CM | POA: Diagnosis not present

## 2020-03-18 DIAGNOSIS — I5032 Chronic diastolic (congestive) heart failure: Secondary | ICD-10-CM | POA: Diagnosis not present

## 2020-03-18 DIAGNOSIS — I2699 Other pulmonary embolism without acute cor pulmonale: Secondary | ICD-10-CM

## 2020-03-18 DIAGNOSIS — M5416 Radiculopathy, lumbar region: Secondary | ICD-10-CM | POA: Diagnosis not present

## 2020-03-18 MED ORDER — PREDNISONE 20 MG PO TABS: 40.0000 mg | ORAL_TABLET | Freq: Every day | ORAL | 0 refills | Status: DC

## 2020-03-18 NOTE — Assessment & Plan Note (Signed)
Has episodic spells of severe pain---not even able to get out of a chair when this happens Will give emergency supply of prednisone---told them to be very careful with this

## 2020-03-18 NOTE — Assessment & Plan Note (Addendum)
BMI is in the high 40's Better now with some fluid loss Tries to be careful

## 2020-03-18 NOTE — Assessment & Plan Note (Signed)
Continues on eliquis 2.5mg  bid (will plan indefintely)

## 2020-03-18 NOTE — Progress Notes (Signed)
Subjective:    Patient ID: Chelsea Keller, female    DOB: 06-26-1935, 85 y.o.   MRN: [id number]  HPI Here for follow up of chronic edema, CHF, etc This visit occurred during the SARS-CoV-2 public health emergency.  Safety protocols were in place, including screening questions prior to the visit, additional usage of staff PPE, and extensive cleaning of exam room while observing appropriate contact time as indicated for disinfecting solutions.   Doing better overall Feels tired and out of breath Weight is down Taking the torsemide every day Tried the spironolactone for 3 days---she felt it made her dizzy, affected her vision and ears. Felt lethargic, etc----so she stopped it  Ulcers have mostly healed One is peeling some No inflammation Due for dermatology visit soon  Mild DOE---"not huffing and puffing like usual" Worried that her blood count is dropping again No chest pain  When pain is bad--takes 3 tylenol (1500) and a tramadol and this helps (never more than twice a day and usually only once)  Current Outpatient Medications on File Prior to Visit  Medication Sig Dispense Refill  . acetaminophen (TYLENOL) 500 MG tablet Take 1,000-1,500 mg by mouth every 6 (six) hours as needed for moderate pain or headache (pain).     Marland Kitchen apixaban (ELIQUIS) 2.5 MG TABS tablet Take 1 tablet (2.5 mg total) by mouth 2 (two) times daily. 180 tablet 3  . gabapentin (NEURONTIN) 300 MG capsule Take 1 capsule in the morning and 2 capsules at bedtime 270 capsule 3  . hydrOXYzine (ATARAX/VISTARIL) 10 MG tablet TAKE 1 TABLET BY MOUTH THREE TIMES A DAY AS NEEDED 90 tablet 0  . torsemide (DEMADEX) 20 MG tablet Take 1 tablet (20 mg total) by mouth daily. 30 tablet 11  . traMADol (ULTRAM) 50 MG tablet Take 1 tablet (50 mg total) by mouth 3 (three) times daily as needed for moderate pain. 60 tablet 0  . triamcinolone (KENALOG) 0.025 % ointment Apply 1 application topically 2 (two) times daily. 80 g 2  .  triamterene-hydrochlorothiazide (MAXZIDE-25) 37.5-25 MG tablet Take 1 tablet by mouth daily. 90 tablet 3   No current facility-administered medications on file prior to visit.    Allergies  Allergen Reactions  . Codeine Sulfate Shortness Of Breath  . Celecoxib Other (See Comments)    Caused vaginal bleeding  . Erythromycin Base Nausea And Vomiting  . Spironolactone     Blurred vision, Upset stomach, lethargy    Past Medical History:  Diagnosis Date  . Allergy   . Anemia   . Anxiety   . Arthritis   . Arthritis of sacroiliac joint of both sides 11/12/2017  . Bilateral pulmonary embolism ([place]) 06/23/2017  . Chronic diastolic CHF (congestive heart failure) (HCC)    a. Echo 1/16:  mild LVH, EF normal, grade 1 DD, MAC  . Chronic venous insufficiency    chronic LE edema  . DDD (degenerative disc disease), lumbar 11/12/2017  . Degenerative joint disease (DJD) of hip, Bilateral  11/12/2017  . Depression   . Fibromyalgia    constant pain  . Hx of cardiac catheterization    a. LHC in [place] "ok" per patient with mild plaque in a single vessel - records not available  . Hx of cardiovascular stress test    a. Nuclear study in 2008 normal  . Hx of colonic polyps   . Hypertension   . Hypertriglyceridemia   . Impaired fasting glucose   . PONV (postoperative nausea and vomiting)   .  PUD (peptic ulcer disease)    hx of gastric ulcer  . Pulmonary emboli ([place]) 06/2017  . Vitamin B12 deficiency     Past Surgical History:  Procedure Laterality Date  . ABDOMINAL HYSTERECTOMY    . CATARACT EXTRACTION  10/2003   OD  . CHOLECYSTECTOMY    . COLONOSCOPY W/ POLYPECTOMY    . COLONOSCOPY WITH PROPOFOL N/A 10/15/2014   Procedure: COLONOSCOPY WITH PROPOFOL;  Surgeon: Ladene Artist, MD;  Location: [facility] ENDOSCOPY;  Service: Endoscopy;  Laterality: N/A;  . COLONOSCOPY WITH PROPOFOL N/A 03/23/2018   Procedure: COLONOSCOPY WITH PROPOFOL;  Surgeon: Gatha Mayer, MD;  Location: [facility] ENDOSCOPY;  Service:  Endoscopy;  Laterality: N/A;  . ESOPHAGOGASTRODUODENOSCOPY (EGD) WITH PROPOFOL N/A 10/15/2014   Procedure: ESOPHAGOGASTRODUODENOSCOPY (EGD) WITH PROPOFOL;  Surgeon: Ladene Artist, MD;  Location: [facility] ENDOSCOPY;  Service: Endoscopy;  Laterality: N/A;  . EXTERNAL FIXATION ANKLE FRACTURE     Fx.  left ankle-fixation with pins later removed sec to infection 1985  . EXTERNAL FIXATION WRIST FRACTURE  1985   left with pins  . EYE SURGERY    . FEMUR FRACTURE SURGERY  06/2009  . FRACTURE SURGERY    . HARDWARE REMOVAL Left 10/05/2013   Procedure: HARDWARE REMOVAL LEFT DISTAL FEMUR;  Surgeon: Rozanna Box, MD;  Location: [facility];  Service: Orthopedics;  Laterality: Left;  . HEMIARTHROPLASTY SHOULDER FRACTURE  06/2009  . HOT HEMOSTASIS N/A 03/23/2018   Procedure: HOT HEMOSTASIS (ARGON PLASMA COAGULATION/BICAP);  Surgeon: Gatha Mayer, MD;  Location: [facility]COPY;  Service: Endoscopy;  Laterality: N/A;  . JOINT REPLACEMENT    . STERIOD INJECTION Right 10/05/2013   Procedure: STEROID INJECTION;  Surgeon: Rozanna Box, MD;  Location: [facility];  Service: Orthopedics;  Laterality: Right;  . TONSILLECTOMY    . TONSILLECTOMY    . TOTAL KNEE ARTHROPLASTY  03/09   left    Family History  Problem Relation Age of Onset  . Depression Mother   . Cancer Mother        uterine cancer  . Heart attack Father   . Cancer Brother        prostate cancer  . Diabetes Maternal Aunt   . Arthritis Brother   . Asthma Brother   . Stroke Maternal Grandmother   . Pulmonary embolism Daughter     Social History   Socioeconomic History  . Marital status: Widowed    Spouse name: Not on file  . Number of children: 4  . Years of education: Not on file  . Highest education level: Not on file  Occupational History  . Occupation: retired crossing guard  . 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 Smoking status: Former Smoker    Packs/day: 1.00    Years: 40.00    Pack years: 40.00    Types:  Cigarettes    Quit date: 07/06/1993    Years since quitting: 26.7  . Smokeless tobacco: Never Used  Vaping Use  . Vaping Use: Never used  Substance and Sexual Activity  . Alcohol use: No    Alcohol/week: 0.0 standard drinks  . Drug use: No  . Sexual activity: Not Currently  Other Topics Concern  . 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 Difficulty of Paying Living Expenses: Not on file  Food Insecurity:   . Worried About Charity fundraiser in the Last Year: Not on file  . Ran Out of Food in the Last Year: Not on file  Transportation Needs:   . Lack of Transportation (Medical): Not on file  . Lack of Transportation (Non-Medical): Not on file  Physical Activity:   . Days of Exercise per Week: Not on file  . Minutes of Exercise per Session: Not on file  Stress:   . Feeling of Stress : Not on file  Social Connections:   . Frequency of Communication with Friends and Family: Not on file  . Frequency of Social Gatherings with Friends and Family: Not on file  . Attends Religious Services: Not on file  . Active Member of Clubs or Organizations: Not on file  . Attends Archivist Meetings: Not on file  . Marital Status: Not on file  Intimate Partner Violence:   . Fear of Current or Ex-Partner: Not on file  . Emotionally Abused: Not on file  . Physically Abused: Not on file  . Sexually Abused: Not on file   Review of Systems     Objective:   Physical Exam Cardiovascular:     Rate and Rhythm: Normal rate and regular rhythm.     Heart sounds: No murmur heard.  No gallop.      Comments: Distant  sounds Pulmonary:     Effort: Pulmonary effort is normal.     Breath sounds: Normal breath sounds. No wheezing or rales.  Musculoskeletal:     Comments: 1+ reedy edema bilateral calves  Skin:    Comments: Several very small superficial open areas No inflammation  Neurological:     Mental Status: She is alert.  Psychiatric:        Mood and Affect: Mood normal.        Behavior: Behavior normal.            Assessment & Plan:

## 2020-03-18 NOTE — Assessment & Plan Note (Signed)
Recurrent and severe at times Will recheck labs

## 2020-03-18 NOTE — Assessment & Plan Note (Signed)
Doing better on the torsemide 20mg ---is able to tolerate that daily Didn't like the spironolactone but weight is down now

## 2020-03-19 LAB — CBC
HCT: 29.6 % — ABNORMAL LOW (ref 36.0–46.0)
Hemoglobin: 9.1 g/dL — ABNORMAL LOW (ref 12.0–15.0)
MCHC: 30.9 g/dL (ref 30.0–36.0)
MCV: 77 fl — ABNORMAL LOW (ref 78.0–100.0)
Platelets: 401 10*3/uL — ABNORMAL HIGH (ref 150.0–400.0)
RBC: 3.84 Mil/uL — ABNORMAL LOW (ref 3.87–5.11)
RDW: 19.2 % — ABNORMAL HIGH (ref 11.5–15.5)
WBC: 9.3 10*3/uL (ref 4.0–10.5)

## 2020-03-19 LAB — RENAL FUNCTION PANEL
Albumin: 3.5 g/dL (ref 3.5–5.2)
BUN: 28 mg/dL — ABNORMAL HIGH (ref 6–23)
CO2: 29 mEq/L (ref 19–32)
Calcium: 9.2 mg/dL (ref 8.4–10.5)
Chloride: 97 mEq/L (ref 96–112)
Creatinine, Ser: 1.21 mg/dL — ABNORMAL HIGH (ref 0.40–1.20)
GFR: 42.45 mL/min — ABNORMAL LOW (ref >60.00)
Glucose, Bld: 134 mg/dL — ABNORMAL HIGH (ref 70–99)
Phosphorus: 2.4 mg/dL (ref 2.3–4.6)
Potassium: 4 mEq/L (ref 3.5–5.1)
Sodium: 137 mEq/L (ref 135–145)

## 2020-03-22 ENCOUNTER — Telehealth: Payer: Self-pay | Admitting: Oncology

## 2020-03-22 NOTE — Telephone Encounter (Signed)
This RN spoke with the patient's daughter - [name]- per her call stating lab done at primary MD with noted heme of 9.1.  Pt is scheduled for possible blood transfusion on Monday.  [name] states her mother is " doing well and she feels she does not need a blood transfusion at this time- and her body is pretty good at telling her when she needs it "  Per discussion- appt request sent to reschedule for 1 month.  Cindy verbalized understanding to call if symptoms occur before requested change.

## 2020-03-25 ENCOUNTER — Inpatient Hospital Stay: Payer: Medicare Other

## 2020-03-28 DIAGNOSIS — Z1159 Encounter for screening for other viral diseases: Secondary | ICD-10-CM | POA: Diagnosis not present

## 2020-04-12 DIAGNOSIS — Z1159 Encounter for screening for other viral diseases: Secondary | ICD-10-CM | POA: Diagnosis not present

## 2020-04-15 ENCOUNTER — Ambulatory Visit (INDEPENDENT_AMBULATORY_CARE_PROVIDER_SITE_OTHER): Payer: Medicare Other | Admitting: Dermatology

## 2020-04-15 ENCOUNTER — Other Ambulatory Visit: Payer: Self-pay

## 2020-04-15 DIAGNOSIS — L309 Dermatitis, unspecified: Secondary | ICD-10-CM | POA: Diagnosis not present

## 2020-04-15 DIAGNOSIS — Z86711 Personal history of pulmonary embolism: Secondary | ICD-10-CM

## 2020-04-15 DIAGNOSIS — R21 Rash and other nonspecific skin eruption: Secondary | ICD-10-CM

## 2020-04-15 DIAGNOSIS — L98499 Non-pressure chronic ulcer of skin of other sites with unspecified severity: Secondary | ICD-10-CM | POA: Diagnosis not present

## 2020-04-15 MED ORDER — MOMETASONE FUROATE 0.1 % EX OINT: TOPICAL_OINTMENT | CUTANEOUS | 0 refills | Status: DC

## 2020-04-15 NOTE — Progress Notes (Signed)
New Patient Visit  Subjective  Chelsea Keller is a 85 y.o. female who presents for the following: Rash. In December of 2020 the patient was hospitalized for pulmonary embolisms and developed a blood infection (sepsis?). She then had a pick line and had an allergic reaction to the anti-biotics that were prescribed for the blood infection. Soon after this happened the rash occurred. Patient is also anemic and has been given iron infusions which she states she is also allergic to. The rash is very itchy and she scratches it until she bleeds. She has tried and failed TMC 0.025% ointment, Prednisone taper (for hip pain), and Hydroxyzine 10mg  po QD, but continues to itch and scratch at her skin.   Medical records reviewed.  Accompanying female helps with history.  The following portions of the chart were reviewed this encounter and updated as appropriate:  Tobacco  Allergies  Meds  Problems  Med Hx  Surg Hx  Fam Hx     Review of Systems:  No other skin or systemic complaints except as noted in HPI or Assessment and Plan.  Objective  Well appearing patient in no apparent distress; mood and affect are within normal limits.  A focused examination was performed including the trunk. Relevant physical exam findings are noted in the Assessment and Plan.  Objective  R chest lateral, R chest medial: Erythematous papules and excoriations scattered on the abdomen and chest.  Images       Assessment & Plan  Rash (2) - allergic medication reaction vs Atopic Dermatitis with excoriations and crusts. Very Complicated patient with history of multiple recent medication reactions and multiple health issues that are very serious. R chest medial; R chest lateral Eczema with pruritus vs other -   Start Mometasone 0.1% ointment to aa's QD-BID PRN. Avoid f/g/a. Topical steroids (such as triamcinolone, fluocinolone, fluocinonide, mometasone, clobetasol, halobetasol, betamethasone, hydrocortisone) can  cause thinning and lightening of the skin if they are used for too long in the same area. Your physician has selected the right strength medicine for your problem and area affected on the body. Please use your medication only as directed by your physician to prevent side effects.    Skin / nail biopsy - R chest medial Type of biopsy: punch   Informed consent: discussed and consent obtained   Timeout: patient name, date of birth, surgical site, and procedure verified   Procedure prep:  Patient was prepped and draped in usual sterile fashion (the patient was cleaned and prepped) Prep type:  Isopropyl alcohol Anesthesia: the lesion was anesthetized in a standard fashion   Anesthetic:  1% lidocaine w/ epinephrine 1-100,000 buffered w/ 8.4% NaHCO3 Punch size:  3 mm Suture size:  4-0 Suture type: nylon   Hemostasis achieved with: suture, pressure and aluminum chloride   Outcome: patient tolerated procedure well   Post-procedure details: sterile dressing applied and wound care instructions given   Dressing type: bandage, petrolatum and pressure dressing    Skin / nail biopsy - R chest lateral Type of biopsy: punch   Informed consent: discussed and consent obtained   Timeout: patient name, date of birth, surgical site, and procedure verified   Procedure prep:  Patient was prepped and draped in usual sterile fashion (the patient was cleaned and prepped) Prep type:  Isopropyl alcohol Anesthesia: the lesion was anesthetized in a standard fashion   Anesthetic:  1% lidocaine w/ epinephrine 1-100,000 buffered w/ 8.4% NaHCO3 Punch size:  3 mm Suture size:  4-0 Suture  type: nylon   Hemostasis achieved with: suture, pressure and aluminum chloride   Outcome: patient tolerated procedure well   Post-procedure details: sterile dressing applied and wound care instructions given   Dressing type: bandage, petrolatum and pressure dressing    Specimen 1 - Surgical pathology Differential Diagnosis: D48.5,  R21 eczema with pruritus vs other  Check Margins: No Erythematous papules and excoriations scattered on the abdomen and chest.  Specimen 2 - Surgical pathology Differential Diagnosis: D48.5, R21 - eczema with pruritus vs other  Check Margins: No Erythematous papules and excoriations scattered on the abdomen and chest.  Ordered Medications: mometasone (ELOCON) 0.1 % ointment  Return in about 1 week (around 04/22/2020) for suture removal and to discuss pathology results.  Luther Redo, CMA, am acting as scribe for Sarina Ser, MD .  Documentation: I have reviewed the above documentation for accuracy and completeness, and I agree with the above.  Sarina Ser, MD

## 2020-04-15 NOTE — Patient Instructions (Addendum)
Wound Care Instructions  1. Cleanse wound gently with soap and water once a day then pat dry with clean gauze. Apply a thing coat of Petrolatum (petroleum jelly, "Vaseline") over the wound (unless you have an allergy to this). We recommend that you use a new, sterile tube of Vaseline. Do not pick or remove scabs. Do not remove the yellow or white "healing tissue" from the base of the wound.  2. Cover the wound with fresh, clean, nonstick gauze and secure with paper tape. You may use Band-Aids in place of gauze and tape if the would is small enough, but would recommend trimming much of the tape off as there is often too much. Sometimes Band-Aids can irritate the skin.  3. You should call the office for your biopsy report after 1 week if you have not already been contacted.  4. If you experience any problems, such as abnormal amounts of bleeding, swelling, significant bruising, significant pain, or evidence of infection, please call the office immediately.  5. FOR ADULT SURGERY PATIENTS: If you need something for pain relief you may take 1 extra strength Tylenol (acetaminophen) AND 2 Ibuprofen (200mg each) together every 4 hours as needed for pain. (do not take these if you are allergic to them or if you have a reason you should not take them.) Typically, you may only need pain medication for 1 to 3 days.    Topical steroids (such as triamcinolone, fluocinolone, fluocinonide, mometasone, clobetasol, halobetasol, betamethasone, hydrocortisone) can cause thinning and lightening of the skin if they are used for too long in the same area. Your physician has selected the right strength medicine for your problem and area affected on the body. Please use your medication only as directed by your physician to prevent side effects.   

## 2020-04-16 ENCOUNTER — Encounter: Payer: Self-pay | Admitting: Dermatology

## 2020-04-19 ENCOUNTER — Other Ambulatory Visit: Payer: Self-pay

## 2020-04-19 ENCOUNTER — Inpatient Hospital Stay: Payer: Medicare Other | Attending: Hematology and Oncology

## 2020-04-19 ENCOUNTER — Encounter: Payer: Self-pay | Admitting: Hematology and Oncology

## 2020-04-19 ENCOUNTER — Other Ambulatory Visit: Payer: Self-pay | Admitting: *Deleted

## 2020-04-19 ENCOUNTER — Inpatient Hospital Stay: Payer: Medicare Other

## 2020-04-19 DIAGNOSIS — D500 Iron deficiency anemia secondary to blood loss (chronic): Secondary | ICD-10-CM | POA: Insufficient documentation

## 2020-04-19 DIAGNOSIS — K922 Gastrointestinal hemorrhage, unspecified: Secondary | ICD-10-CM | POA: Insufficient documentation

## 2020-04-19 DIAGNOSIS — K5520 Angiodysplasia of colon without hemorrhage: Secondary | ICD-10-CM | POA: Diagnosis not present

## 2020-04-19 DIAGNOSIS — K648 Other hemorrhoids: Secondary | ICD-10-CM | POA: Insufficient documentation

## 2020-04-19 DIAGNOSIS — D638 Anemia in other chronic diseases classified elsewhere: Secondary | ICD-10-CM

## 2020-04-19 LAB — CBC WITH DIFFERENTIAL (CANCER CENTER ONLY)
Abs Immature Granulocytes: 0.03 10*3/uL (ref 0.00–0.07)
Basophils Absolute: 0.1 10*3/uL (ref 0.0–0.1)
Basophils Relative: 1 %
Eosinophils Absolute: 0.9 10*3/uL — ABNORMAL HIGH (ref 0.0–0.5)
Eosinophils Relative: 10 %
HCT: 26.5 % — ABNORMAL LOW (ref 36.0–46.0)
Hemoglobin: 7.6 g/dL — ABNORMAL LOW (ref 12.0–15.0)
Immature Granulocytes: 0 %
Lymphocytes Relative: 12 %
Lymphs Abs: 1.1 10*3/uL (ref 0.7–4.0)
MCH: 22.2 pg — ABNORMAL LOW (ref 26.0–34.0)
MCHC: 28.7 g/dL — ABNORMAL LOW (ref 30.0–36.0)
MCV: 77.3 fL — ABNORMAL LOW (ref 80.0–100.0)
Monocytes Absolute: 0.8 10*3/uL (ref 0.1–1.0)
Monocytes Relative: 9 %
Neutro Abs: 6.1 10*3/uL (ref 1.7–7.7)
Neutrophils Relative %: 68 %
Platelet Count: 329 10*3/uL (ref 150–400)
RBC: 3.43 MIL/uL — ABNORMAL LOW (ref 3.87–5.11)
RDW: 17.8 % — ABNORMAL HIGH (ref 11.5–15.5)
WBC Count: 9.1 10*3/uL (ref 4.0–10.5)
nRBC: 0 % (ref 0.0–0.2)

## 2020-04-19 LAB — IRON AND TIBC
Iron: 21 ug/dL — ABNORMAL LOW (ref 41–142)
Saturation Ratios: 5 % — ABNORMAL LOW (ref 21–57)
TIBC: 389 ug/dL (ref 236–444)
UIBC: 368 ug/dL (ref 120–384)

## 2020-04-19 LAB — SAMPLE TO BLOOD BANK

## 2020-04-19 LAB — FERRITIN: Ferritin: 8 ng/mL — ABNORMAL LOW (ref 11–307)

## 2020-04-19 LAB — PREPARE RBC (CROSSMATCH)

## 2020-04-19 MED ORDER — ACETAMINOPHEN 325 MG PO TABS
ORAL_TABLET | ORAL | Status: AC
  Filled 2020-04-19: qty 2

## 2020-04-19 MED ORDER — ACETAMINOPHEN 325 MG PO TABS
650.0000 mg | ORAL_TABLET | Freq: Once | ORAL | Status: AC
  Administered 2020-04-19: 650 mg via ORAL

## 2020-04-19 MED ORDER — DIPHENHYDRAMINE HCL 25 MG PO CAPS
25.0000 mg | ORAL_CAPSULE | Freq: Once | ORAL | Status: AC
  Administered 2020-04-19: 25 mg via ORAL

## 2020-04-19 MED ORDER — SODIUM CHLORIDE 0.9% IV SOLUTION
250.0000 mL | Freq: Once | INTRAVENOUS | Status: AC
  Administered 2020-04-19: 250 mL via INTRAVENOUS
  Filled 2020-04-19: qty 250

## 2020-04-19 NOTE — Patient Instructions (Signed)

## 2020-04-20 LAB — TYPE AND SCREEN
ABO/RH(D): B POS
Antibody Screen: NEGATIVE
Unit division: 0
Unit division: 0

## 2020-04-20 LAB — BPAM RBC
Blood Product Expiration Date: 202111112359
Blood Product Expiration Date: 202111112359
ISSUE DATE / TIME: 202110151339
ISSUE DATE / TIME: 202110151339
Unit Type and Rh: 7300
Unit Type and Rh: 7300

## 2020-04-22 ENCOUNTER — Telehealth: Payer: Self-pay | Admitting: Hematology and Oncology

## 2020-04-22 ENCOUNTER — Ambulatory Visit
Admission: RE | Admit: 2020-04-22 | Discharge: 2020-04-22 | Disposition: A | Discharge status: Home / Self Care | Payer: Medicare Other | Source: Ambulatory Visit | Attending: Orthopedic Surgery | Admitting: Orthopedic Surgery

## 2020-04-22 ENCOUNTER — Other Ambulatory Visit: Payer: Self-pay | Admitting: Hematology and Oncology

## 2020-04-22 ENCOUNTER — Other Ambulatory Visit: Payer: Self-pay

## 2020-04-22 DIAGNOSIS — D500 Iron deficiency anemia secondary to blood loss (chronic): Secondary | ICD-10-CM

## 2020-04-22 DIAGNOSIS — M1611 Unilateral primary osteoarthritis, right hip: Secondary | ICD-10-CM

## 2020-04-22 MED ORDER — METHYLPREDNISOLONE ACETATE 40 MG/ML INJ SUSP (RADIOLOG
120.0000 mg | Freq: Once | INTRAMUSCULAR | Status: AC
  Administered 2020-04-22: 120 mg via INTRA_ARTICULAR

## 2020-04-22 MED ORDER — IOPAMIDOL (ISOVUE-M 200) INJECTION 41%
1.0000 mL | Freq: Once | INTRAMUSCULAR | Status: AC
  Administered 2020-04-22: 1 mL via INTRA_ARTICULAR

## 2020-04-22 NOTE — Telephone Encounter (Signed)
Please see if they can come in at one of the early morning open slots on Wednesday. Otherwise, can add at 4:15 on Thursday

## 2020-04-22 NOTE — Progress Notes (Signed)
I reviewed the lab work with the patient's family.  She received 2 units of PRBC for hemoglobin of 7.6. Iron studies show severe iron deficiency but she cannot take oral iron nor IV iron because of multiple allergies. Therefore we will transfuse her as needed.  It appears that she needs it every 2 months.  We will set up a 3-month follow-up appointment with labs and transfusions if needed. She is going to [place] in a week to see her brother who is not in the best of health.

## 2020-04-22 NOTE — Telephone Encounter (Signed)
Called pt per 10/18 sch msg - left message with apt date and time

## 2020-04-22 NOTE — Telephone Encounter (Signed)
I left a message on daughter's voice mail to call back and schedule appointment.

## 2020-04-24 ENCOUNTER — Other Ambulatory Visit: Payer: Self-pay

## 2020-04-24 ENCOUNTER — Ambulatory Visit (INDEPENDENT_AMBULATORY_CARE_PROVIDER_SITE_OTHER): Payer: Medicare Other | Admitting: Dermatology

## 2020-04-24 ENCOUNTER — Other Ambulatory Visit: Payer: Self-pay | Admitting: Internal Medicine

## 2020-04-24 DIAGNOSIS — L308 Other specified dermatitis: Secondary | ICD-10-CM

## 2020-04-24 DIAGNOSIS — T490X5A Adverse effect of local antifungal, anti-infective and anti-inflammatory drugs, initial encounter: Secondary | ICD-10-CM

## 2020-04-24 DIAGNOSIS — T39395A Adverse effect of other nonsteroidal anti-inflammatory drugs [NSAID], initial encounter: Secondary | ICD-10-CM

## 2020-04-24 MED ORDER — EUCRISA 2 % EX OINT: TOPICAL_OINTMENT | CUTANEOUS | 2 refills | Status: DC

## 2020-04-24 NOTE — Progress Notes (Signed)
   Follow-Up Visit   Subjective  Chelsea Keller is a 85 y.o. female who presents for the following: Suture / Staple Removal (1 week f/u suture removal and discuss biopsy results ) and Rash (itchy skin started after using Mometasone cream, pt stopped Mometasone cream 4 days ago ).  The following portions of the chart were reviewed this encounter and updated as appropriate:  Tobacco  Allergies  Meds  Problems  Med Hx  Surg Hx  Fam Hx     Review of Systems:  No other skin or systemic complaints except as noted in HPI or Assessment and Plan.  Objective  Well appearing patient in no apparent distress; mood and affect are within normal limits.  A focused examination was performed including trunk, exts . Relevant physical exam findings are noted in the Assessment and Plan.  Objective  trunk, exts: Scaly erythematous papules and patches +/- dyspigmentation, lichenification, excoriations.   Objective  chest: Red itchy skin    Assessment & Plan  Eczema - biopsy proven trunk, exts Biopsy results discussed  Biopsy proven 1&2 - most consistent with Eczematous dermatitis with excoriations. Start Eucrisa Ointment apply to skin qd-bid prn   May consider Protopic or Elidel cream if Georga Hacking is not covered by insurance   May consider Patch testing in the future   Encounter for Removal of Sutures - Incision site at the R chest medial, R chest lateral is clean, dry and intact - Wound cleansed, sutures removed, wound cleansed and steri strips applied.  - Discussed pathology results showing 1&2 most consistent with Eczematous dermatitis with excoriations. - Scars remodel for a full year. -  patient can apply over-the-counter silicone scar cream each night to help with scar remodeling if desired. - Patient advised to call with any concerns or if they notice any new or changing lesions.   Adverse effect of topical steroid cream (Mometasone) with itch.  May have allergy?  May consider Patch  testing.  Change to non-steroid treatment as above. chest Adverse reaction to Mometasone cream- caused itch skin pt used for 3 days then stopped 4 days ago.   May consider Patch testing in the future   Return in about 6 weeks (around 06/05/2020).  IMarye Round, CMA, am acting as scribe for Sarina Ser, MD .  Documentation: I have reviewed the above documentation for accuracy and completeness, and I agree with the above.  Sarina Ser, MD

## 2020-04-25 ENCOUNTER — Other Ambulatory Visit: Payer: Self-pay

## 2020-04-25 ENCOUNTER — Ambulatory Visit (INDEPENDENT_AMBULATORY_CARE_PROVIDER_SITE_OTHER): Payer: Medicare Other | Admitting: Internal Medicine

## 2020-04-25 ENCOUNTER — Encounter: Payer: Self-pay | Admitting: Internal Medicine

## 2020-04-25 DIAGNOSIS — M109 Gout, unspecified: Secondary | ICD-10-CM | POA: Insufficient documentation

## 2020-04-25 DIAGNOSIS — M10379 Gout due to renal impairment, unspecified ankle and foot: Secondary | ICD-10-CM | POA: Diagnosis not present

## 2020-04-25 MED ORDER — OXYCODONE-ACETAMINOPHEN 5-325 MG PO TABS
1.0000 | ORAL_TABLET | ORAL | 0 refills | Status: DC | PRN
Start: 2020-04-25 — End: 2021-03-24

## 2020-04-25 MED ORDER — TRAMADOL HCL 50 MG PO TABS: 50.0000 mg | ORAL_TABLET | Freq: Three times a day (TID) | ORAL | 0 refills | Status: DC | PRN

## 2020-04-25 NOTE — Patient Instructions (Signed)
Stop the triamterene/HCTZ. Monitor blood pressure once in a while. Take the colchicine daily for the gout. If your toe pain is not better within a week---I would like to start allopurinol to prevent the gout from coming on.

## 2020-04-25 NOTE — Progress Notes (Signed)
Subjective:    Patient ID: Chelsea Keller, female    DOB: 05-14-1935, 85 y.o.   MRN: [id number]  HPI Here due to foot pain This visit occurred during the SARS-CoV-2 public health emergency.  Safety protocols were in place, including screening questions prior to the visit, additional usage of staff PPE, and extensive cleaning of exam room while observing appropriate contact time as indicated for disinfecting solutions.   Having pain in both 4th toes No pressure from shoes Both have some redness proximal to the nails Tried gout med---did help temporarily (only used it once)  Current Outpatient Medications on File Prior to Visit  Medication Sig Dispense Refill  . acetaminophen (TYLENOL) 500 MG tablet Take 1,000-1,500 mg by mouth every 6 (six) hours as needed for moderate pain or headache (pain).     Marland Kitchen apixaban (ELIQUIS) 2.5 MG TABS tablet Take 1 tablet (2.5 mg total) by mouth 2 (two) times daily. 180 tablet 3  . gabapentin (NEURONTIN) 300 MG capsule Take 1 capsule in the morning and 2 capsules at bedtime 270 capsule 3  . KLOR-CON M20 20 MEQ tablet Take 20 mEq by mouth 2 (two) times daily.    Marland Kitchen torsemide (DEMADEX) 20 MG tablet Take 1 tablet (20 mg total) by mouth daily. 30 tablet 11  . traMADol (ULTRAM) 50 MG tablet Take 1 tablet (50 mg total) by mouth 3 (three) times daily as needed for moderate pain. 60 tablet 0  . triamterene-hydrochlorothiazide (MAXZIDE-25) 37.5-25 MG tablet TAKE 1 TABLET DAILY 90 tablet 3  . Crisaborole (EUCRISA) 2 % OINT Apply to skin daily (Patient not taking: Reported on 04/25/2020) 60 g 2  . hydrOXYzine (ATARAX/VISTARIL) 10 MG tablet TAKE 1 TABLET BY MOUTH THREE TIMES A DAY AS NEEDED (Patient not taking: Reported on 04/25/2020) 90 tablet 0  . mometasone (ELOCON) 0.1 % ointment Apply to aa's rash QD-BID PRN. Avoid face, groin, and axilla. (Patient not taking: Reported on 04/25/2020) 45 g 0  . predniSONE (DELTASONE) 20 MG tablet Take 2 tablets (40 mg total) by mouth  daily. For 5 days, then 1 daily for 5 days (Patient not taking: Reported on 04/25/2020) 15 tablet 0  . triamcinolone (KENALOG) 0.025 % ointment Apply 1 application topically 2 (two) times daily. (Patient not taking: Reported on 04/25/2020) 80 g 2   No current facility-administered medications on file prior to visit.    Allergies  Allergen Reactions  . Codeine Sulfate Shortness Of Breath  . Celecoxib Other (See Comments)    Caused vaginal bleeding  . Erythromycin Base Nausea And Vomiting  . Spironolactone     Blurred vision, Upset stomach, lethargy    Past Medical History:  Diagnosis Date  . Allergy   . Anemia   . Anxiety   . Arthritis   . Arthritis of sacroiliac joint of both sides 11/12/2017  . Bilateral pulmonary embolism ([place]) 06/23/2017  . Chronic diastolic CHF (congestive heart failure) (HCC)    a. Echo 1/16:  mild LVH, EF normal, grade 1 DD, MAC  . Chronic venous insufficiency    chronic LE edema  . DDD (degenerative disc disease), lumbar 11/12/2017  . Degenerative joint disease (DJD) of hip, Bilateral  11/12/2017  . Depression   . Fibromyalgia    constant pain  . Hx of cardiac catheterization    a. LHC in [place] "ok" per patient with mild plaque in a single vessel - records not available  . Hx of cardiovascular stress test    a. Nuclear study  in 2008 normal  . Hx of colonic polyps   . Hypertension   . Hypertriglyceridemia   . Impaired fasting glucose   . PONV (postoperative nausea and vomiting)   . PUD (peptic ulcer disease)    hx of gastric ulcer  . Pulmonary emboli ([place]) 06/2017  . Vitamin B12 deficiency     Past Surgical History:  Procedure Laterality Date  . ABDOMINAL HYSTERECTOMY    . CATARACT EXTRACTION  10/2003   OD  . CHOLECYSTECTOMY    . COLONOSCOPY W/ POLYPECTOMY    . COLONOSCOPY WITH PROPOFOL N/A 10/15/2014   Procedure: COLONOSCOPY WITH PROPOFOL;  Surgeon: Ladene Artist, MD;  Location: [facility];  Service: Endoscopy;  Laterality: N/A;  .  COLONOSCOPY WITH PROPOFOL N/A 03/23/2018   Procedure: COLONOSCOPY WITH PROPOFOL;  Surgeon: Gatha Mayer, MD;  Location: [facility] ENDOSCOPY;  Service: Endoscopy;  Laterality: N/A;  . ESOPHAGOGASTRODUODENOSCOPY (EGD) WITH PROPOFOL N/A 10/15/2014   Procedure: ESOPHAGOGASTRODUODENOSCOPY (EGD) WITH PROPOFOL;  Surgeon: Ladene Artist, MD;  [facility]: [facility];  Service: Endoscopy;  Laterality: N/A;  . EXTERNAL FIXATION ANKLE FRACTURE     Fx.  left ankle-fixation with pins later removed sec to infection 1985  . EXTERNAL FIXATION WRIST FRACTURE  1985   left with pins  . EYE SURGERY    . FEMUR FRACTURE SURGERY  06/2009  . FRACTURE SURGERY    . HARDWARE REMOVAL Left 10/05/2013   Procedure: HARDWARE REMOVAL LEFT DISTAL FEMUR;  Surgeon: Rozanna Box, MD;  Location: [facility];  Service: Orthopedics;  Laterality: Left;  . HEMIARTHROPLASTY SHOULDER FRACTURE  06/2009  . HOT HEMOSTASIS N/A 03/23/2018   Procedure: HOT HEMOSTASIS (ARGON PLASMA COAGULATION/BICAP);  Surgeon: Gatha Mayer, MD;  Location: [facility] ENDOSCOPY;  Service: Endoscopy;  Laterality: N/A;  . JOINT REPLACEMENT    . STERIOD INJECTION Right 10/05/2013   Procedure: STEROID INJECTION;  Surgeon: Rozanna Box, MD;  Location: [place];  Service: Orthopedics;  Laterality: Right;  . TONSILLECTOMY    . TONSILLECTOMY    . TOTAL KNEE ARTHROPLASTY  03/09   left    Family History  Problem Relation Age of Onset  . Depression Mother   . Cancer Mother        uterine cancer  . Heart attack Father   . Cancer Brother        prostate cancer  . Diabetes Maternal Aunt   . Arthritis Brother   . Asthma Brother   . Stroke Maternal Grandmother   . Pulmonary embolism Daughter     Social History   Socioeconomic History  . Marital status: Widowed    Spouse name: Not on file  . Number of children: 4  . Years of education: Not on file  . Highest education level: Not on file  Occupational History  . Occupation: retired crossing guard  . 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 Smoking status: Former Smoker    Packs/day: 1.00    Years: 40.00    Pack years: 40.00    Types: Cigarettes    Quit date: 07/06/1993    Years since quitting: 26.8  . Smokeless tobacco: Never Used  Vaping Use  . Vaping Use: Never used  Substance and Sexual Activity  . Alcohol use: No    Alcohol/week: 0.0 standard drinks  . Drug use: No  . Sexual activity: Not Currently  Other Topics Concern  . Not on file  Social History Narrative   Retired crossing  guard   Moved to [place] from [place]   Widow, lives w/ daughter and adult grandsons   Former smoker, no EtOH      No living will   Plans to do health care POA forms---wants daughter [name]   Would accept resuscitation attempts---no prolonged ventilation   Absolutely no feeding tube   Social Determinants of Health   Financial Resource Strain:   . Difficulty of Paying Living Expenses: Not on file  Food Insecurity:   . Worried About Charity fundraiser in the Last Year: Not on file  . Ran Out of Food in the Last Year: Not on file  Transportation Needs:   . Lack of Transportation (Medical): Not on file  . Lack of Transportation (Non-Medical): Not on file  Physical Activity:   . Days of Exercise per Week: Not on file  . Minutes of Exercise per Session: Not on file  Stress:   . Feeling of Stress : Not on file  Social Connections:   . Frequency of Communication with Friends and Family: Not on file  . Frequency of Social Gatherings with Friends and Family: Not on file  . Attends Religious Services: Not on file  . Active Member of Clubs or Organizations: Not on file  . Attends Archivist Meetings: Not on file  . Marital Status: Not on file  Intimate Partner Violence:   . Fear of Current or Ex-Partner: Not on file  . Emotionally Abused: Not on file  . Physically Abused: Not on file  . Sexually Abused: Not on file   Review of Systems  Weight is up some Is going to [place]  --wants oxycodone for pain (won't have her regular recliner to sleep in)     Objective:   Physical Exam Constitutional:      Appearance: Normal appearance.  Musculoskeletal:     Comments: Redness and tenderness in DIP of both 4th toes  Neurological:     Mental Status: She is alert.            Assessment & Plan:

## 2020-04-25 NOTE — Assessment & Plan Note (Signed)
Will stop HCTZ and monitor BP Use the colchicine daily If symptoms persist, will start allopurinol

## 2020-04-29 ENCOUNTER — Encounter: Payer: Self-pay | Admitting: Dermatology

## 2020-05-10 ENCOUNTER — Other Ambulatory Visit: Payer: Self-pay | Admitting: Orthopedic Surgery

## 2020-05-10 DIAGNOSIS — M47818 Spondylosis without myelopathy or radiculopathy, sacral and sacrococcygeal region: Secondary | ICD-10-CM

## 2020-05-15 ENCOUNTER — Other Ambulatory Visit: Payer: Self-pay

## 2020-05-15 ENCOUNTER — Ambulatory Visit
Admission: RE | Admit: 2020-05-15 | Discharge: 2020-05-15 | Disposition: A | Discharge status: Home / Self Care | Payer: Medicare Other | Source: Ambulatory Visit | Attending: Orthopedic Surgery | Admitting: Orthopedic Surgery

## 2020-05-15 DIAGNOSIS — M47818 Spondylosis without myelopathy or radiculopathy, sacral and sacrococcygeal region: Secondary | ICD-10-CM

## 2020-05-15 DIAGNOSIS — M533 Sacrococcygeal disorders, not elsewhere classified: Secondary | ICD-10-CM | POA: Diagnosis not present

## 2020-05-15 MED ORDER — IOPAMIDOL (ISOVUE-M 200) INJECTION 41%
1.0000 mL | Freq: Once | INTRAMUSCULAR | Status: AC
  Administered 2020-05-15: 1 mL via INTRA_ARTICULAR

## 2020-05-15 MED ORDER — METHYLPREDNISOLONE ACETATE 40 MG/ML INJ SUSP (RADIOLOG
120.0000 mg | Freq: Once | INTRAMUSCULAR | Status: AC
  Administered 2020-05-15: 120 mg via INTRA_ARTICULAR

## 2020-05-15 NOTE — Discharge Instructions (Signed)
Post Procedure Spinal Discharge Instruction Sheet  1. You may resume a regular diet and any medications that you routinely take (including pain medications).  2. No driving day of procedure.  3. Light activity throughout the rest of the day.  Do not do any strenuous work, exercise, bending or lifting.  The day following the procedure, you can resume normal physical activity but you should refrain from exercising or physical therapy for at least three days thereafter.   Common Side Effects:   Headaches- take your usual medications as directed by your physician.  Increase your fluid intake.  Caffeinated beverages may be helpful.  Lie flat in bed until your headache resolves.   Restlessness or inability to sleep- you may have trouble sleeping for the next few days.  Ask your referring physician if you need any medication for sleep.   Facial flushing or redness- should subside within a few days.   Increased pain- a temporary increase in pain a day or two following your procedure is not unusual.  Take your pain medication as prescribed by your referring physician.   Leg cramps  Please contact our office at [phone] for the following symptoms:  Fever greater than 100 degrees.  Headaches unresolved with medication after 2-3 days.  Increased swelling, pain, or redness at injection site.  YOU MAY RESUME YOUR ELIQUIS IN 24 HOURS (TOMORROW 05/16/20 @ 11AM)

## 2020-05-20 DIAGNOSIS — M79641 Pain in right hand: Secondary | ICD-10-CM | POA: Diagnosis not present

## 2020-05-20 DIAGNOSIS — M19041 Primary osteoarthritis, right hand: Secondary | ICD-10-CM | POA: Diagnosis not present

## 2020-05-21 DIAGNOSIS — Z1159 Encounter for screening for other viral diseases: Secondary | ICD-10-CM | POA: Diagnosis not present

## 2020-05-24 ENCOUNTER — Encounter: Payer: Self-pay | Admitting: Internal Medicine

## 2020-05-24 ENCOUNTER — Ambulatory Visit (INDEPENDENT_AMBULATORY_CARE_PROVIDER_SITE_OTHER): Payer: Medicare Other | Admitting: Internal Medicine

## 2020-05-24 ENCOUNTER — Other Ambulatory Visit: Payer: Self-pay

## 2020-05-24 VITALS — BP 124/66 | HR 77 | Temp 98.1°F | Ht 62.0 in | Wt 260.0 lb

## 2020-05-24 DIAGNOSIS — I2699 Other pulmonary embolism without acute cor pulmonale: Secondary | ICD-10-CM | POA: Diagnosis not present

## 2020-05-24 DIAGNOSIS — D649 Anemia, unspecified: Secondary | ICD-10-CM | POA: Diagnosis not present

## 2020-05-24 DIAGNOSIS — I5032 Chronic diastolic (congestive) heart failure: Secondary | ICD-10-CM

## 2020-05-24 LAB — CBC
HCT: 34.1 % — ABNORMAL LOW (ref 36.0–46.0)
Hemoglobin: 10.4 g/dL — ABNORMAL LOW (ref 12.0–15.0)
MCHC: 30.6 g/dL (ref 30.0–36.0)
MCV: 76.7 fl — ABNORMAL LOW (ref 78.0–100.0)
Platelets: 363 10*3/uL (ref 150.0–400.0)
RBC: 4.44 Mil/uL (ref 3.87–5.11)
RDW: 21.8 % — ABNORMAL HIGH (ref 11.5–15.5)
WBC: 15.7 10*3/uL — ABNORMAL HIGH (ref 4.0–10.5)

## 2020-05-24 LAB — RENAL FUNCTION PANEL
Albumin: 3.6 g/dL (ref 3.5–5.2)
BUN: 17 mg/dL (ref 6–23)
CO2: 29 mEq/L (ref 19–32)
Calcium: 9.2 mg/dL (ref 8.4–10.5)
Chloride: 104 mEq/L (ref 96–112)
Creatinine, Ser: 0.8 mg/dL (ref 0.40–1.20)
GFR: 68.06 mL/min (ref >60.00)
Glucose, Bld: 108 mg/dL — ABNORMAL HIGH (ref 70–99)
Phosphorus: 3.2 mg/dL (ref 2.3–4.6)
Potassium: 4.5 mEq/L (ref 3.5–5.1)
Sodium: 141 mEq/L (ref 135–145)

## 2020-05-24 MED ORDER — PREDNISONE 20 MG PO TABS: 40.0000 mg | ORAL_TABLET | Freq: Every day | ORAL | 0 refills | Status: DC

## 2020-05-24 NOTE — Assessment & Plan Note (Signed)
It sounds like she has worsening of her anemia again Will check CBC  If down, will push up follow up with Dr Lindi Adie

## 2020-05-24 NOTE — Progress Notes (Signed)
Subjective:    Patient ID: Chelsea Keller, female    DOB: June 09, 1935, 85 y.o.   MRN: [id number]  HPI Here with daughter [name] due to lack of energy This visit occurred during the SARS-CoV-2 public health emergency.  Safety protocols were in place, including screening questions prior to the visit, additional usage of staff PPE, and extensive cleaning of exam room while observing appropriate contact time as indicated for disinfecting solutions.   She feels very "detached"---"like I'm on the outside looking in" Feels tired--but up all night (and had been sleeping better in general) No SOB No sig dizziness Has some soreness/chest pain on right side/axilla  Hasn't needed the torsemide---no recent swelling  Having more trouble getting around in house Discussed trying therapy if not improving Has had before--discussed restarting her home exercises  Current Outpatient Medications on File Prior to Visit  Medication Sig Dispense Refill  . acetaminophen (TYLENOL) 500 MG tablet Take 1,000-1,500 mg by mouth every 6 (six) hours as needed for moderate pain or headache (pain).     Marland Kitchen apixaban (ELIQUIS) 2.5 MG TABS tablet Take 1 tablet (2.5 mg total) by mouth 2 (two) times daily. 180 tablet 3  . gabapentin (NEURONTIN) 300 MG capsule Take 1 capsule in the morning and 2 capsules at bedtime 270 capsule 3  . KLOR-CON M20 20 MEQ tablet Take 20 mEq by mouth 2 (two) times daily.    . mometasone (ELOCON) 0.1 % ointment Apply to aa's rash QD-BID PRN. Avoid face, groin, and axilla. 45 g 0  . oxyCODONE-acetaminophen (PERCOCET/ROXICET) 5-325 MG tablet Take 1 tablet by mouth every 4 (four) hours as needed for severe pain. 10 tablet 0  . torsemide (DEMADEX) 20 MG tablet Take 1 tablet (20 mg total) by mouth daily. 30 tablet 11  . traMADol (ULTRAM) 50 MG tablet Take 1 tablet (50 mg total) by mouth 3 (three) times daily as needed for moderate pain. 60 tablet 0  . triamterene-hydrochlorothiazide (MAXZIDE-25) 37.5-25 MG  tablet TAKE 1 TABLET DAILY 90 tablet 3   No current facility-administered medications on file prior to visit.    Allergies  Allergen Reactions  . Codeine Sulfate Shortness Of Breath  . Celecoxib Other (See Comments)    Caused vaginal bleeding  . Erythromycin Base Nausea And Vomiting  . Spironolactone Nausea Only and Other (See Comments)    Blurred vision, Upset stomach, lethargy    Past Medical History:  Diagnosis Date  . Allergy   . Anemia   . Anxiety   . Arthritis   . Arthritis of sacroiliac joint of both sides 11/12/2017  . Bilateral pulmonary embolism ([place]) 06/23/2017  . Chronic diastolic CHF (congestive heart failure) (HCC)    a. Echo 1/16:  mild LVH, EF normal, grade 1 DD, MAC  . Chronic venous insufficiency    chronic LE edema  . DDD (degenerative disc disease), lumbar 11/12/2017  . Degenerative joint disease (DJD) of hip, Bilateral  11/12/2017  . Depression   . Fibromyalgia    constant pain  . Hx of cardiac catheterization    a. LHC in [place] "ok" per patient with mild plaque in a single vessel - records not available  . Hx of cardiovascular stress test    a. Nuclear study in 2008 normal  . Hx of colonic polyps   . Hypertension   . Hypertriglyceridemia   . Impaired fasting glucose   . PONV (postoperative nausea and vomiting)   . PUD (peptic ulcer disease)    hx of  gastric ulcer  . Pulmonary emboli ([place]) 06/2017  . Vitamin B12 deficiency     Past Surgical History:  Procedure Laterality Date  . ABDOMINAL HYSTERECTOMY    . CATARACT EXTRACTION  10/2003   OD  . CHOLECYSTECTOMY    . COLONOSCOPY W/ POLYPECTOMY    . COLONOSCOPY WITH PROPOFOL N/A 10/15/2014   Procedure: COLONOSCOPY WITH PROPOFOL;  Surgeon: Ladene Artist, MD;  [facility];  Service: Endoscopy;  Laterality: N/A;  . COLONOSCOPY WITH PROPOFOL N/A 03/23/2018   Procedure: COLONOSCOPY WITH PROPOFOL;  Surgeon: Gatha Mayer, MD;  Location: [facility]COPY;  Service: Endoscopy;  Laterality: N/A;  .  ESOPHAGOGASTRODUODENOSCOPY (EGD) WITH PROPOFOL N/A 10/15/2014   Procedure: ESOPHAGOGASTRODUODENOSCOPY (EGD) WITH PROPOFOL;  Surgeon: Ladene Artist, MD;  [facility];  Service: Endoscopy;  Laterality: N/A;  . EXTERNAL FIXATION ANKLE FRACTURE     Fx.  left ankle-fixation with pins later removed sec to infection 1985  . EXTERNAL FIXATION WRIST FRACTURE  1985   left with pins  . EYE SURGERY    . FEMUR FRACTURE SURGERY  06/2009  . FRACTURE SURGERY    . HARDWARE REMOVAL Left 10/05/2013   Procedure: HARDWARE REMOVAL LEFT DISTAL FEMUR;  Surgeon: Rozanna Box, MD;  Location: [place];  Service: Orthopedics;  Laterality: Left;  . HEMIARTHROPLASTY SHOULDER FRACTURE  06/2009  . HOT HEMOSTASIS N/A 03/23/2018   Procedure: HOT HEMOSTASIS (ARGON PLASMA COAGULATION/BICAP);  Surgeon: Gatha Mayer, MD;  Location: [facility] ENDOSCOPY;  Service: Endoscopy;  Laterality: N/A;  . JOINT REPLACEMENT    . STERIOD INJECTION Right 10/05/2013   Procedure: STEROID INJECTION;  Surgeon: Rozanna Box, MD;  Location: [facility];  Service: Orthopedics;  Laterality: Right;  . TONSILLECTOMY    . TONSILLECTOMY    . TOTAL KNEE ARTHROPLASTY  03/09   left    Family History  Problem Relation Age of Onset  . Depression Mother   . Cancer Mother        uterine cancer  . Heart attack Father   . Cancer Brother        prostate cancer  . Diabetes Maternal Aunt   . Arthritis Brother   . Asthma Brother   . Stroke Maternal Grandmother   . Pulmonary embolism Daughter     Social History   Socioeconomic History  . Marital status: Widowed    Spouse name: Not on file  . Number of children: 4  . Years of education: Not on file  . Highest education level: Not on file  Occupational History  . Occupation: retired crossing guard  . 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 Smoking status: Former Smoker    Packs/day: 1.00    Years: 40.00    Pack years: 40.00    Types: Cigarettes    Quit date: 07/06/1993     Years since quitting: 26.9  . Smokeless tobacco: Never Used  Vaping Use  . Vaping Use: Never used  Substance and Sexual Activity  . Alcohol use: No    Alcohol/week: 0.0 standard drinks  . Drug use: No  . Sexual activity: Not Currently  Other Topics Concern  . 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 Difficulty of Paying Living Expenses: Not on file  Food Insecurity:   . Worried About Charity fundraiser in the Last Year: Not on file  . Ran Out of Food in the Last Year: Not on file  Transportation Needs:   . Lack of Transportation (Medical): Not on file  . Lack of Transportation (Non-Medical): Not on file  Physical Activity:   . Days of Exercise per Week: Not on file  . Minutes of Exercise per Session: Not on file  Stress:   . Feeling of Stress : Not on file  Social Connections:   . Frequency of Communication with Friends and Family: Not on file  . Frequency of Social Gatherings with Friends and Family: Not on file  . Attends Religious Services: Not on file  . Active Member of Clubs or Organizations: Not on file  . Attends Archivist Meetings: Not on file  . Marital Status: Not on file  Intimate Partner Violence:   . Fear of Current or Ex-Partner: Not on file  . Emotionally Abused: Not on file  . Physically Abused: Not on file  . Sexually Abused: Not on file   Review of Systems Eating okay--not much appetite  Had great trip to [facility]--- back on Halloween Did need the emergency prednisone---for inflammation/pain in back and hands    Objective:   Physical Exam Cardiovascular:     Rate and  Rhythm: Normal rate and regular rhythm.     Heart sounds: No murmur heard.  No gallop.   Pulmonary:     Effort: Pulmonary effort is normal.     Breath sounds: Normal breath sounds. No wheezing or rales.  Musculoskeletal:     Cervical back: Neck supple.     Right lower leg: No edema.     Left lower leg: No edema.     Comments: No active synovitis in hands  Lymphadenopathy:     Cervical: No cervical adenopathy.  Neurological:     Mental Status: She is alert.            Assessment & Plan:

## 2020-05-24 NOTE — Assessment & Plan Note (Signed)
Compensated now Hasn't needed the torsemide recently

## 2020-05-24 NOTE — Assessment & Plan Note (Signed)
No evidence of recurrence Is on the eliquis

## 2020-06-10 ENCOUNTER — Ambulatory Visit (INDEPENDENT_AMBULATORY_CARE_PROVIDER_SITE_OTHER): Payer: Medicare Other | Admitting: Dermatology

## 2020-06-10 ENCOUNTER — Other Ambulatory Visit: Payer: Self-pay

## 2020-06-10 DIAGNOSIS — L308 Other specified dermatitis: Secondary | ICD-10-CM

## 2020-06-10 DIAGNOSIS — L820 Inflamed seborrheic keratosis: Secondary | ICD-10-CM | POA: Diagnosis not present

## 2020-06-10 NOTE — Patient Instructions (Signed)

## 2020-06-10 NOTE — Progress Notes (Signed)
   Follow-Up Visit   Subjective  Chelsea Keller is a 85 y.o. female who presents for the following: Follow-up (Biopsy proven eczema follow up - Eucrisa oint bid and it has helped a lot.).  Accompanied by daughter who contributes to history  The following portions of the chart were reviewed this encounter and updated as appropriate:   Tobacco  Allergies  Meds  Problems  Med Hx  Surg Hx  Fam Hx     Review of Systems:  No other skin or systemic complaints except as noted in HPI or Assessment and Plan.  Objective  Well appearing patient in no apparent distress; mood and affect are within normal limits.  A focused examination was performed including chest, abdomen. Relevant physical exam findings are noted in the Assessment and Plan.  Objective  Right chest x 1, left chest x 1 (2): Erythematous keratotic or waxy stuck-on papule or plaque.    Assessment & Plan  Atopic dermatitis/eczema Left Abdomen (side) - Upper Biopsy Proven - Improved on Eucrisa Chronic condition, no cure, only control. Not currently at goal.   Continue Eucrisa oint bid  Recommend Dove for sensitive skin cleanser - samples given. Continue Cerave cream  Atopic dermatitis (eczema) is a chronic, relapsing, pruritic condition that can significantly affect quality of life. It is often associated with allergic rhinitis and/or asthma and can require treatment with topical medications, phototherapy, or in severe cases a biologic medication called Dupixent.   Inflamed seborrheic keratosis (2) Right chest x 1, left chest x 1  Destruction of lesion - Right chest x 1, left chest x 1 Complexity: simple   Destruction method: cryotherapy   Informed consent: discussed and consent obtained   Timeout:  patient name, date of birth, surgical site, and procedure verified Lesion destroyed using liquid nitrogen: Yes   Region frozen until ice ball extended beyond lesion: Yes   Outcome: patient tolerated procedure well with no  complications   Post-procedure details: wound care instructions given    Return in about 3 months (around 09/08/2020).   I, Ashok Cordia, CMA, am acting as scribe for Sarina Ser, MD .  Documentation: I have reviewed the above documentation for accuracy and completeness, and I agree with the above.  Sarina Ser, MD

## 2020-06-17 ENCOUNTER — Encounter: Payer: Self-pay | Admitting: Dermatology

## 2020-06-21 ENCOUNTER — Ambulatory Visit: Payer: Medicare Other | Admitting: Hematology and Oncology

## 2020-06-21 ENCOUNTER — Other Ambulatory Visit: Payer: Medicare Other

## 2020-06-22 IMAGING — DX DG HIP (WITH OR WITHOUT PELVIS) 2-3V*L*
4 series · 4 of 4 positions shown · non-contrast
Comparison: 04/03/2017

CLINICAL DATA: Chronic left hip pain ongoing since [REDACTED],
worsening this week.

EXAM:
DG HIP (WITH OR WITHOUT PELVIS) 2-3V LEFT

[pelvis ap (1 of 2)]
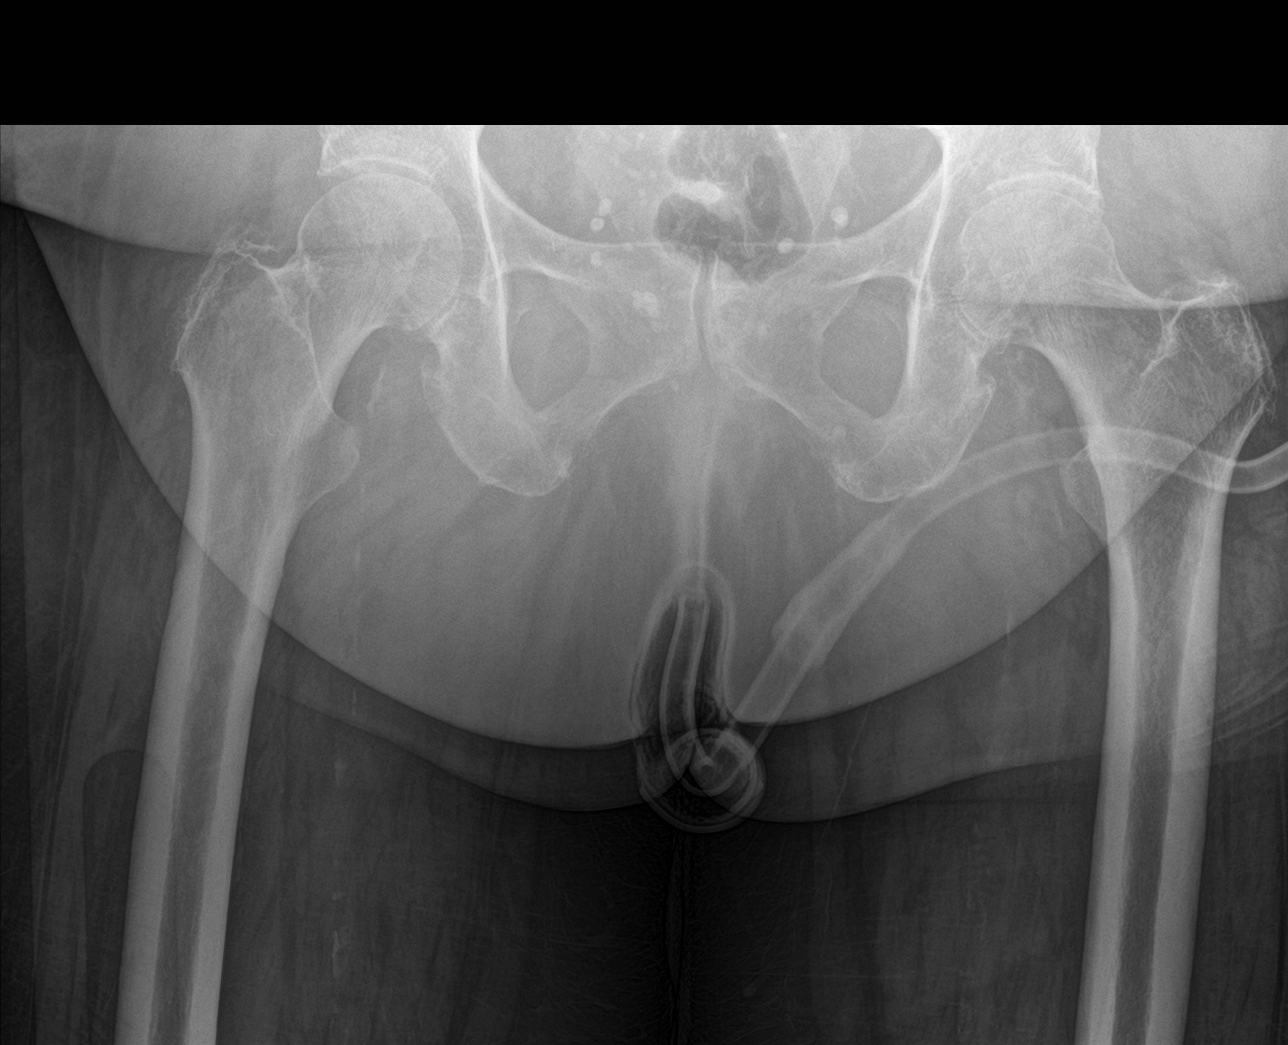

[hip ap]
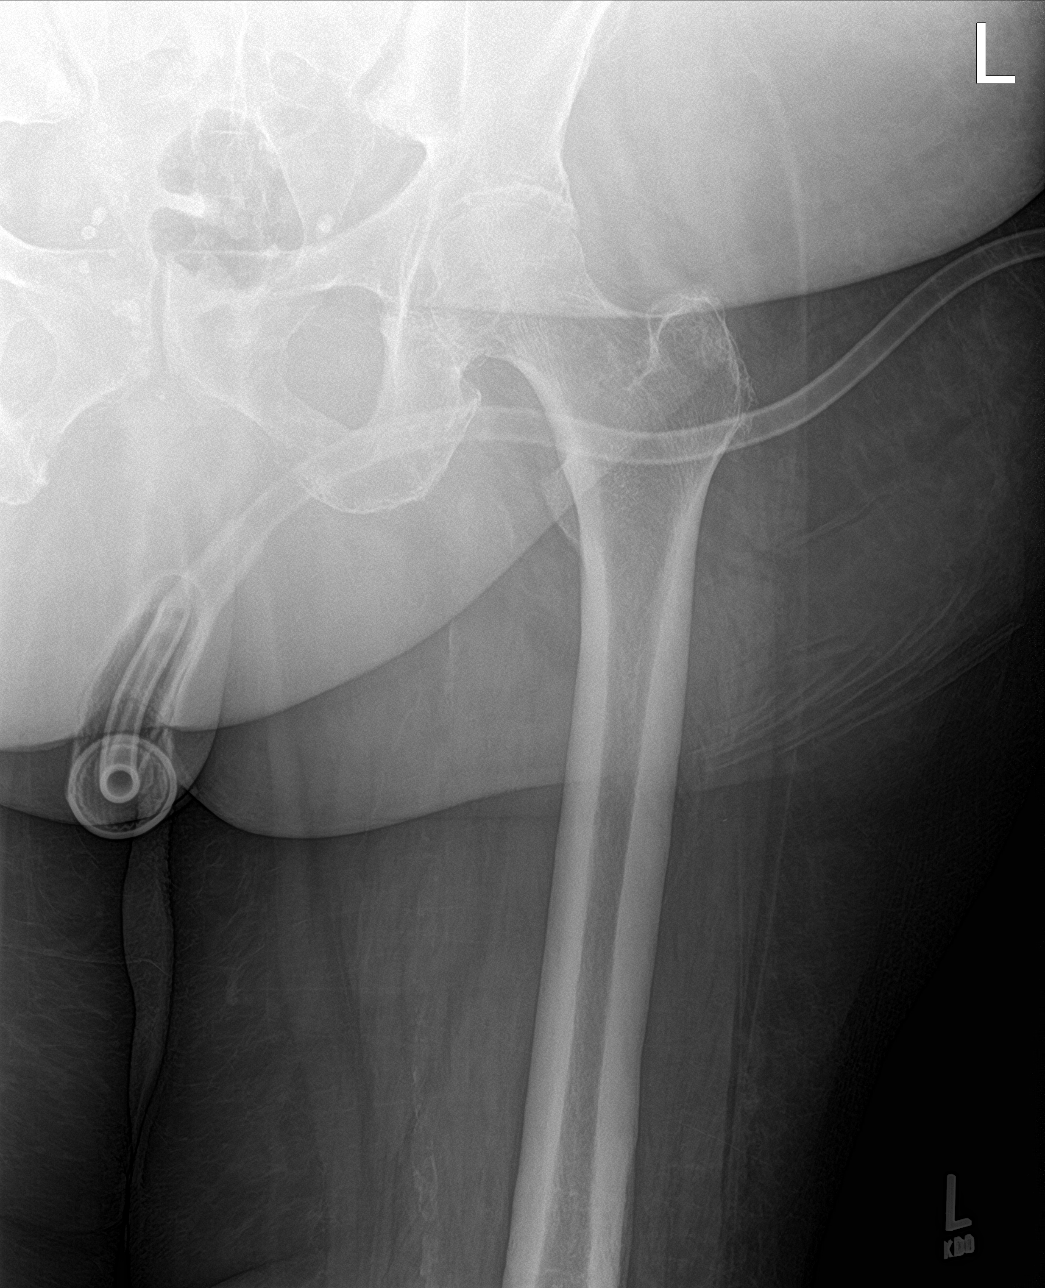

[hip lat]
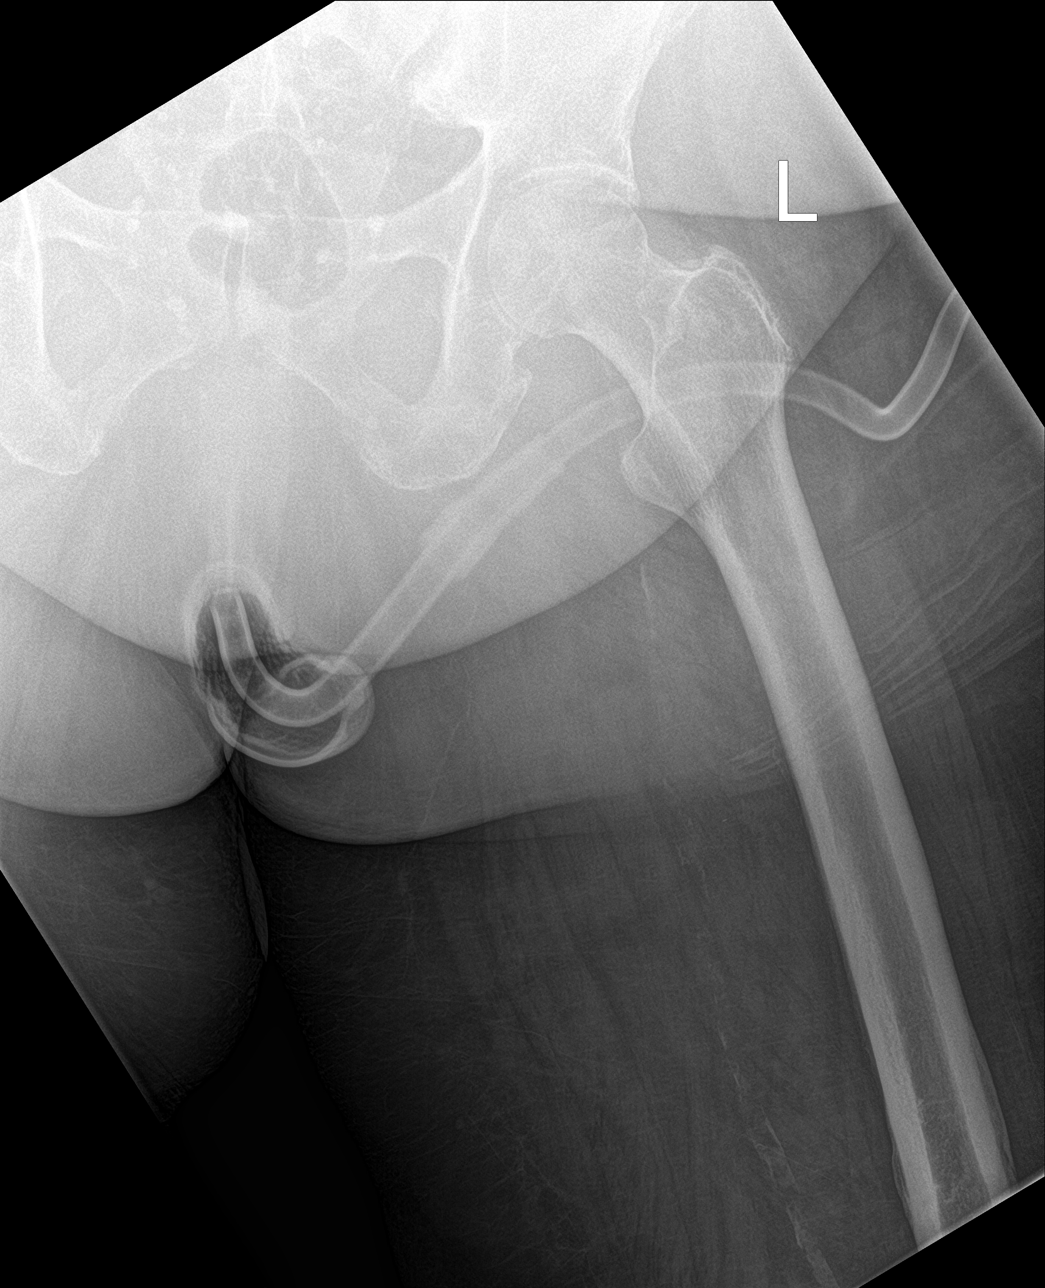

[pelvis ap (2 of 2)]
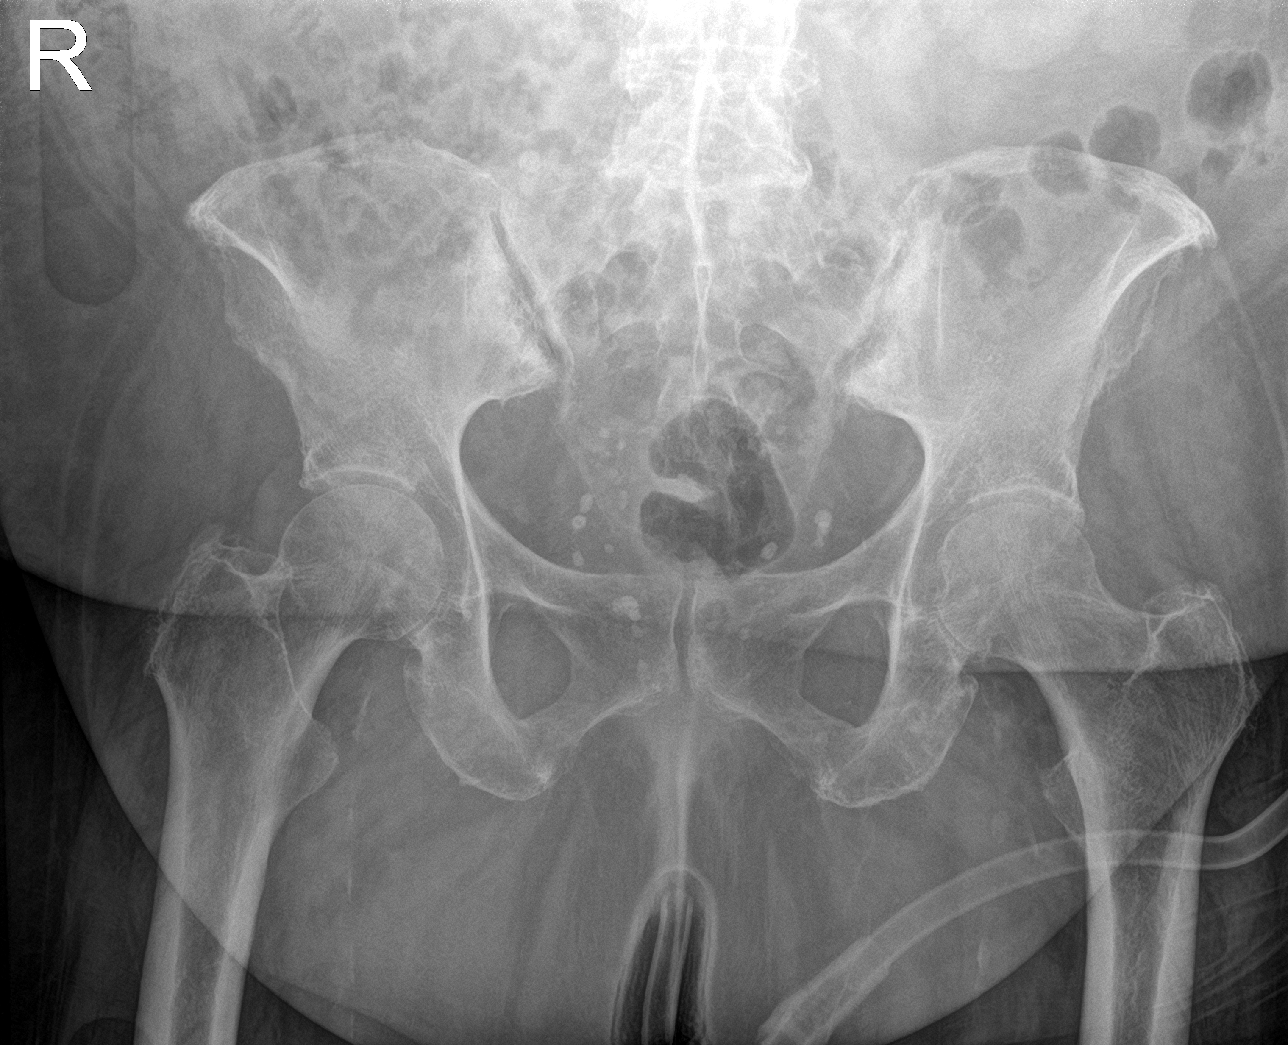

[4 of 4 positions shown; findings below may reference images not displayed]

FINDINGS: Chronic stable lumbar spondylosis of the included lumbar spine.
Sclerosis of both sacroiliac joints compatible with chronic
sacroiliitis or osteoarthritis. No pelvic diastasis. No pelvic
fracture. Mild joint space narrowing of both hips without acute
fracture. Catheter tubing projects over the lower pelvis.
IMPRESSION: 1. Chronic lumbar spondylosis.
2. Bilateral sacroiliac joint sclerosis compatible with chronic
sacroiliitis.
3. No acute pelvic nor hip fracture.
4. Osteoarthritis of both hips.

## 2020-06-23 NOTE — Progress Notes (Signed)
Patient Care Team: Venia Carbon, MD as PCP - General Angelena Form Annita Brod, MD as PCP - Cardiology (Cardiology) Sharmon Revere as Physician Assistant (Physician Assistant)  DIAGNOSIS:    ICD-10-CM   1. Iron deficiency anemia due to chronic blood loss  D50.0     CHIEF COMPLIANT: Follow-up of iron deficiency anemia and bilateral PEs  INTERVAL HISTORY: Chelsea Keller is a 85 y.o. with above-mentioned history of iron deficiency anemiapreviously treated with IV ironand who currently receives transfusions about every two months (last 04/19/20). She also has a history of bilateral PEs currently on Eliquis.Shepresentsto the clinictoday for follow-up. She continues to use a wheelchair for ambulation.  She has profound lower extremity edema and dryness of the skin.  ALLERGIES:  is allergic to codeine sulfate, celecoxib, erythromycin base, and spironolactone.  MEDICATIONS:  Current Outpatient Medications  Medication Sig Dispense Refill  . acetaminophen (TYLENOL) 500 MG tablet Take 1,000-1,500 mg by mouth every 6 (six) hours as needed for moderate pain or headache (pain).     Marland Kitchen apixaban (ELIQUIS) 2.5 MG TABS tablet Take 1 tablet (2.5 mg total) by mouth 2 (two) times daily. 180 tablet 3  . gabapentin (NEURONTIN) 300 MG capsule Take 1 capsule in the morning and 2 capsules at bedtime 270 capsule 3  . KLOR-CON M20 20 MEQ tablet Take 20 mEq by mouth 2 (two) times daily.    . mometasone (ELOCON) 0.1 % ointment Apply to aa's rash QD-BID PRN. Avoid face, groin, and axilla. 45 g 0  . oxyCODONE-acetaminophen (PERCOCET/ROXICET) 5-325 MG tablet Take 1 tablet by mouth every 4 (four) hours as needed for severe pain. 10 tablet 0  . predniSONE (DELTASONE) 20 MG tablet Take 2 tablets (40 mg total) by mouth daily. For 5 days, then 1 tab daily for 5 days 15 tablet 0  . torsemide (DEMADEX) 20 MG tablet Take 1 tablet (20 mg total) by mouth daily. 30 tablet 11  . traMADol (ULTRAM) 50 MG tablet Take  1 tablet (50 mg total) by mouth 3 (three) times daily as needed for moderate pain. 60 tablet 0  . triamterene-hydrochlorothiazide (MAXZIDE-25) 37.5-25 MG tablet TAKE 1 TABLET DAILY 90 tablet 3   No current facility-administered medications for this visit.    PHYSICAL EXAMINATION: ECOG PERFORMANCE STATUS: 1 - Symptomatic but completely ambulatory  Vitals:   06/24/20 1112  BP: (!) 143/75  Pulse: 85  Resp: 18  Temp: 98.1 F (36.7 C)  SpO2: 99%   Filed Weights   06/24/20 1112  Weight: 264 lb 12.8 oz (120.1 kg)    LABORATORY DATA:  I have reviewed the data as listed CMP Latest Ref Rng & Units 05/24/2020 03/18/2020 01/22/2020  Glucose 70 - 99 mg/dL 108(H) 134(H) 103(H)  BUN 6 - 23 mg/dL 17 28(H) 16  Creatinine 0.40 - 1.20 mg/dL 0.80 1.21(H) 1.09(H)  Sodium 135 - 145 mEq/L 141 137 142  Potassium 3.5 - 5.1 mEq/L 4.5 4.0 3.8  Chloride 96 - 112 mEq/L 104 97 108  CO2 19 - 32 mEq/L 29 29 26   Calcium 8.4 - 10.5 mg/dL 9.2 9.2 9.6  Total Protein 6.5 - 8.1 g/dL - - 5.8(L)  Total Bilirubin 0.3 - 1.2 mg/dL - - 0.8  Alkaline Phos 38 - 126 U/L - - 96  AST 15 - 41 U/L - - 28  ALT 0 - 44 U/L - - 14    Lab Results  Component Value Date   WBC 9.0 06/24/2020   HGB 9.5 (  L) 06/24/2020   HCT 32.8 (L) 06/24/2020   MCV 82.4 06/24/2020   PLT 373 06/24/2020   NEUTROABS 6.3 06/24/2020    ASSESSMENT & PLAN:  Iron deficiency anemia due to chronic blood loss Hospitalization 08/11/2018: Severe GI bleed hemoglobin 4.9(internal hemorrhoids and angiodysplasia of the colon)Received blood transfusions IV iron treatment: September 2019, March 2020, Injectafer June 2020, Venofer January 2021  Toxicities of iron infusion:Patient gets profound diarrhea which last for 2 to 3 days afterFeraheme (and prescription for Lomotil for 3 days).With Venofer she did okay but she still had profound diarrhea as well as lethargic  10/10/2019: Hemoglobin 6.8: MCV 82, RDW 18:2 units of PRBC given on  10/11/2019. 11/03/2019: Hemoglobin 8.1 (iron studies and erythropoietin levels are pending) 11/20/2019: Hemoglobin 7.1, MCV 81.2 Iron studiesferritin 7, iron saturation 6%, absolute reticulocyte count 88.3 05/24/2020: Hemoglobin 10.4 06/24/2020: Hemoglobin 9.5  Blood transfusions: 11/29/2019, 01/02/2020  Because of her severe intolerance with the IV iron we are unable to do so. She will continue with oral iron.  Current treatment: Blood transfusions as needed We might consider blood transfusions if the hemoglobin drops below 7 or if she were to become symptomatic.  Return to clinic in 2 months with labs and blood transfusion if needed.    No orders of the defined types were placed in this encounter.  The patient has a good understanding of the overall plan. she agrees with it. she will call with any problems that may develop before the next visit here.  Total time spent: 20 mins including face to face time and time spent for planning, charting and coordination of care  Nicholas Lose, MD 06/24/2020  I, Cloyde Reams Dorshimer, am acting as scribe for Dr. Nicholas Lose.  I have reviewed the above documentation for accuracy and completeness, and I agree with the above.

## 2020-06-24 ENCOUNTER — Other Ambulatory Visit: Payer: Self-pay

## 2020-06-24 ENCOUNTER — Inpatient Hospital Stay: Payer: Medicare Other | Attending: Hematology and Oncology

## 2020-06-24 ENCOUNTER — Inpatient Hospital Stay (HOSPITAL_BASED_OUTPATIENT_CLINIC_OR_DEPARTMENT_OTHER): Payer: Medicare Other | Admitting: Hematology and Oncology

## 2020-06-24 ENCOUNTER — Inpatient Hospital Stay: Payer: Medicare Other

## 2020-06-24 DIAGNOSIS — R5383 Other fatigue: Secondary | ICD-10-CM | POA: Diagnosis not present

## 2020-06-24 DIAGNOSIS — Z7901 Long term (current) use of anticoagulants: Secondary | ICD-10-CM | POA: Insufficient documentation

## 2020-06-24 DIAGNOSIS — K648 Other hemorrhoids: Secondary | ICD-10-CM | POA: Insufficient documentation

## 2020-06-24 DIAGNOSIS — Z885 Allergy status to narcotic agent status: Secondary | ICD-10-CM | POA: Diagnosis not present

## 2020-06-24 DIAGNOSIS — Z86711 Personal history of pulmonary embolism: Secondary | ICD-10-CM | POA: Insufficient documentation

## 2020-06-24 DIAGNOSIS — R600 Localized edema: Secondary | ICD-10-CM | POA: Insufficient documentation

## 2020-06-24 DIAGNOSIS — D500 Iron deficiency anemia secondary to blood loss (chronic): Secondary | ICD-10-CM

## 2020-06-24 DIAGNOSIS — R197 Diarrhea, unspecified: Secondary | ICD-10-CM | POA: Insufficient documentation

## 2020-06-24 DIAGNOSIS — Z881 Allergy status to other antibiotic agents status: Secondary | ICD-10-CM | POA: Insufficient documentation

## 2020-06-24 DIAGNOSIS — Z79899 Other long term (current) drug therapy: Secondary | ICD-10-CM | POA: Insufficient documentation

## 2020-06-24 LAB — CBC WITH DIFFERENTIAL (CANCER CENTER ONLY)
Abs Immature Granulocytes: 0.11 10*3/uL — ABNORMAL HIGH (ref 0.00–0.07)
Basophils Absolute: 0.1 10*3/uL (ref 0.0–0.1)
Basophils Relative: 1 %
Eosinophils Absolute: 0.4 10*3/uL (ref 0.0–0.5)
Eosinophils Relative: 4 %
HCT: 32.8 % — ABNORMAL LOW (ref 36.0–46.0)
Hemoglobin: 9.5 g/dL — ABNORMAL LOW (ref 12.0–15.0)
Immature Granulocytes: 1 %
Lymphocytes Relative: 14 %
Lymphs Abs: 1.2 10*3/uL (ref 0.7–4.0)
MCH: 23.9 pg — ABNORMAL LOW (ref 26.0–34.0)
MCHC: 29 g/dL — ABNORMAL LOW (ref 30.0–36.0)
MCV: 82.4 fL (ref 80.0–100.0)
Monocytes Absolute: 1 10*3/uL (ref 0.1–1.0)
Monocytes Relative: 11 %
Neutro Abs: 6.3 10*3/uL (ref 1.7–7.7)
Neutrophils Relative %: 69 %
Platelet Count: 373 10*3/uL (ref 150–400)
RBC: 3.98 MIL/uL (ref 3.87–5.11)
RDW: 19.5 % — ABNORMAL HIGH (ref 11.5–15.5)
WBC Count: 9 10*3/uL (ref 4.0–10.5)
nRBC: 0.2 % (ref 0.0–0.2)

## 2020-06-24 LAB — SAMPLE TO BLOOD BANK

## 2020-06-24 NOTE — Assessment & Plan Note (Signed)
Hospitalization 08/11/2018: Severe GI bleed hemoglobin 4.9(internal hemorrhoids and angiodysplasia of the colon)Received blood transfusions IV iron treatment: September 2019, March 2020, Injectafer June 2020, Venofer January 2021  Toxicities of iron infusion:Patient gets profound diarrhea which last for 2 to 3 days afterFeraheme (and prescription for Lomotil for 3 days).With Venofer she did okay but she still had profound diarrhea as well as lethargic  10/10/2019: Hemoglobin 6.8: MCV 82, RDW 18:2 units of PRBC given on 10/11/2019. 11/03/2019: Hemoglobin 8.1 (iron studies and erythropoietin levels are pending) 11/20/2019: Hemoglobin 7.1, MCV 81.2 Iron studiesferritin 7, iron saturation 6%, absolute reticulocyte count 88.3 05/24/2020: Hemoglobin 10.4  Blood transfusions: 11/29/2019, 01/02/2020  Because of her severe intolerance with the IV iron we are unable to do so. She will continue with oral iron.  Current treatment: Blood transfusions as needed We might consider blood transfusions if the hemoglobin drops below 7 or if she were to become symptomatic. She has required blood transfusions every month

## 2020-06-27 ENCOUNTER — Telehealth: Payer: Self-pay | Admitting: Hematology and Oncology

## 2020-06-27 NOTE — Telephone Encounter (Signed)
Scheduled per 12/20 los. Called pt no answer and unable to leave a msg. Mailing appt letter and calendar printout

## 2020-07-11 ENCOUNTER — Encounter: Payer: Self-pay | Admitting: Internal Medicine

## 2020-07-11 ENCOUNTER — Other Ambulatory Visit: Payer: Self-pay

## 2020-07-11 ENCOUNTER — Ambulatory Visit (INDEPENDENT_AMBULATORY_CARE_PROVIDER_SITE_OTHER): Payer: Medicare Other | Admitting: Internal Medicine

## 2020-07-11 DIAGNOSIS — F39 Unspecified mood [affective] disorder: Secondary | ICD-10-CM | POA: Diagnosis not present

## 2020-07-11 DIAGNOSIS — R0982 Postnasal drip: Secondary | ICD-10-CM | POA: Diagnosis not present

## 2020-07-11 DIAGNOSIS — I5032 Chronic diastolic (congestive) heart failure: Secondary | ICD-10-CM | POA: Diagnosis not present

## 2020-07-11 DIAGNOSIS — R5383 Other fatigue: Secondary | ICD-10-CM | POA: Insufficient documentation

## 2020-07-11 MED ORDER — TRAMADOL HCL 50 MG PO TABS: 50.0000 mg | ORAL_TABLET | Freq: Three times a day (TID) | ORAL | 0 refills | Status: DC | PRN

## 2020-07-11 MED ORDER — GABAPENTIN 300 MG PO CAPS: ORAL_CAPSULE | ORAL | 3 refills | Status: DC

## 2020-07-11 NOTE — Assessment & Plan Note (Signed)
I think she has somatic symptoms from all the stress Might want to consider medication if this worsens

## 2020-07-11 NOTE — Progress Notes (Signed)
Subjective:    Patient ID: Chelsea Keller, female    DOB: 07/28/1934, 86 y.o.   MRN: [id number]  HPI Here with daughter due to fatigue This visit occurred during the SARS-CoV-2 public health emergency.  Safety protocols were in place, including screening questions prior to the visit, additional usage of staff PPE, and extensive cleaning of exam room while observing appropriate contact time as indicated for disinfecting solutions.   "I wish I knew" what was going on Goes back a month Issues with eating---hungry but has nausea. Does eat Having respiratory thing---post nasal drip with cough (clear mucus) Has to sleep almost fully sitting up to decrease the drip  Feels lethargic-- "like someone deflated me" Different fatigue than her usual Last Hgb 9.5---not bad for her. Will get transfusion next month though  Lots of stress Her grandsons won't speak to her or their mom (her daughter)  Current Outpatient Medications on File Prior to Visit  Medication Sig Dispense Refill  . acetaminophen (TYLENOL) 500 MG tablet Take 1,000-1,500 mg by mouth every 6 (six) hours as needed for moderate pain or headache (pain).     Marland Kitchen apixaban (ELIQUIS) 2.5 MG TABS tablet Take 1 tablet (2.5 mg total) by mouth 2 (two) times daily. 180 tablet 3  . gabapentin (NEURONTIN) 300 MG capsule Take 1 capsule in the morning and 2 capsules at bedtime 270 capsule 3  . KLOR-CON M20 20 MEQ tablet Take 20 mEq by mouth 2 (two) times daily.    . mometasone (ELOCON) 0.1 % ointment Apply to aa's rash QD-BID PRN. Avoid face, groin, and axilla. 45 g 0  . oxyCODONE-acetaminophen (PERCOCET/ROXICET) 5-325 MG tablet Take 1 tablet by mouth every 4 (four) hours as needed for severe pain. 10 tablet 0  . torsemide (DEMADEX) 20 MG tablet Take 1 tablet (20 mg total) by mouth daily. 30 tablet 11  . traMADol (ULTRAM) 50 MG tablet Take 1 tablet (50 mg total) by mouth 3 (three) times daily as needed for moderate pain. 60 tablet 0  .  triamterene-hydrochlorothiazide (MAXZIDE-25) 37.5-25 MG tablet TAKE 1 TABLET DAILY 90 tablet 3   No current facility-administered medications on file prior to visit.    Allergies  Allergen Reactions  . Codeine Sulfate Shortness Of Breath  . Celecoxib Other (See Comments)    Caused vaginal bleeding  . Erythromycin Base Nausea And Vomiting  . Spironolactone Nausea Only and Other (See Comments)    Blurred vision, Upset stomach, lethargy    Past Medical History:  Diagnosis Date  . Allergy   . Anemia   . Anxiety   . Arthritis   . Arthritis of sacroiliac joint of both sides 11/12/2017  . Bilateral pulmonary embolism ([place]) 06/23/2017  . Chronic diastolic CHF (congestive heart failure) (HCC)    a. Echo 1/16:  mild LVH, EF normal, grade 1 DD, MAC  . Chronic venous insufficiency    chronic LE edema  . DDD (degenerative disc disease), lumbar 11/12/2017  . Degenerative joint disease (DJD) of hip, Bilateral  11/12/2017  . Depression   . Fibromyalgia    constant pain  . Hx of cardiac catheterization    a. LHC in [place] "ok" per patient with mild plaque in a single vessel - records not available  . Hx of cardiovascular stress test    a. Nuclear study in 2008 normal  . Hx of colonic polyps   . Hypertension   . Hypertriglyceridemia   . Impaired fasting glucose   . PONV (postoperative nausea  and vomiting)   . PUD (peptic ulcer disease)    hx of gastric ulcer  . Pulmonary emboli ([place]) 06/2017  . Vitamin B12 deficiency     Past Surgical History:  Procedure Laterality Date  . ABDOMINAL HYSTERECTOMY    . CATARACT EXTRACTION  10/2003   OD  . CHOLECYSTECTOMY    . COLONOSCOPY W/ POLYPECTOMY    . COLONOSCOPY WITH PROPOFOL N/A 10/15/2014   Procedure: COLONOSCOPY WITH PROPOFOL;  Surgeon: Ladene Artist, MD;  Location: [facility] ENDOSCOPY;  Service: Endoscopy;  Laterality: N/A;  . COLONOSCOPY WITH PROPOFOL N/A 03/23/2018   Procedure: COLONOSCOPY WITH PROPOFOL;  Surgeon: Gatha Mayer, MD;  Location:  [facility] ENDOSCOPY;  Service: Endoscopy;  Laterality: N/A;  . ESOPHAGOGASTRODUODENOSCOPY (EGD) WITH PROPOFOL N/A 10/15/2014   Procedure: ESOPHAGOGASTRODUODENOSCOPY (EGD) WITH PROPOFOL;  Surgeon: Ladene Artist, MD;  Location: [facility] ENDOSCOPY;  Service: Endoscopy;  Laterality: N/A;  . EXTERNAL FIXATION ANKLE FRACTURE     Fx.  left ankle-fixation with pins later removed sec to infection 1985  . EXTERNAL FIXATION WRIST FRACTURE  1985   left with pins  . EYE SURGERY    . FEMUR FRACTURE SURGERY  06/2009  . FRACTURE SURGERY    . HARDWARE REMOVAL Left 10/05/2013   Procedure: HARDWARE REMOVAL LEFT DISTAL FEMUR;  Surgeon: Rozanna Box, MD;  Location: [facility];  Service: Orthopedics;  Laterality: Left;  . HEMIARTHROPLASTY SHOULDER FRACTURE  06/2009  . HOT HEMOSTASIS N/A 03/23/2018   Procedure: HOT HEMOSTASIS (ARGON PLASMA COAGULATION/BICAP);  Surgeon: Gatha Mayer, MD;  Location: [facility] ENDOSCOPY;  Service: Endoscopy;  Laterality: N/A;  . JOINT REPLACEMENT    . STERIOD INJECTION Right 10/05/2013   Procedure: STEROID INJECTION;  Surgeon: Rozanna Box, MD;  Location: [facility];  Service: Orthopedics;  Laterality: Right;  . TONSILLECTOMY    . TONSILLECTOMY    . TOTAL KNEE ARTHROPLASTY  03/09   left    Family History  Problem Relation Age of Onset  . Depression Mother   . Cancer Mother        uterine cancer  . Heart attack Father   . Cancer Brother        prostate cancer  . Diabetes Maternal Aunt   . Arthritis Brother   . Asthma Brother   . Stroke Maternal Grandmother   . Pulmonary embolism Daughter     Social History   Socioeconomic History  . Marital status: Widowed    Spouse name: Not on file  . Number of children: 4  . Years of education: Not on file  . Highest education level: Not on file  Occupational History  . Occupation: retired crossing guard  . 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 Smoking status: Former Smoker    Packs/day: 1.00    Years: 40.00    Pack  years: 40.00    Types: Cigarettes    Quit date: 07/06/1993    Years since quitting: 27.0  . Smokeless tobacco: Never Used  Vaping Use  . Vaping Use: Never used  Substance and Sexual Activity  . Alcohol use: No    Alcohol/week: 0.0 standard drinks  . Drug use: No  . Sexual activity: Not Currently  Other Topics Concern  . 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 and adult grandsons   Former smoker, no EtOH  No living will   Plans to do health care POA forms---wants daughter [name]   Would accept resuscitation attempts---no prolonged ventilation   Absolutely no feeding tube   Social Determinants of Health   Financial Resource Strain: Not on file  Food Insecurity: Not on file  Transportation Needs: Not on file  Physical Activity: Not on file  Stress: Not on file  Social Connections: Not on file  Intimate Partner Violence: Not on file   Review of Systems  Does sleep ---and in the day Has had some chest pain---fleeting and more laterally along right and left. Usually with the phlegm Some wheezing Eating garbage---but not filling with fluid    Objective:   Physical Exam Constitutional:      Appearance: She is obese.  Cardiovascular:     Rate and Rhythm: Normal rate and regular rhythm.     Heart sounds: No murmur heard. No gallop.   Pulmonary:     Effort: Pulmonary effort is normal.     Breath sounds: Normal breath sounds. No wheezing or rales.  Musculoskeletal:     Cervical back: Neck supple.     Comments: 1+ edema in lower calves. Mild stasis changes  Lymphadenopathy:     Cervical: No cervical adenopathy.  Neurological:     Mental Status: She is alert.  Psychiatric:     Comments: Discussed the stress---but no clear depression            Assessment & Plan:

## 2020-07-11 NOTE — Assessment & Plan Note (Signed)
Not exacerbated Has the torsemide to use

## 2020-07-11 NOTE — Assessment & Plan Note (Signed)
Asked her to restart the allegra she had taken

## 2020-07-11 NOTE — Assessment & Plan Note (Signed)
Vague multiorgan symptoms I suspect this is related to the stress she and her daughter are going through Recent labs okay---anemic but not nearly as bad as she has been  Asked her wait for now Let me know if she worsens I hope for improvement

## 2020-07-15 DIAGNOSIS — Z1159 Encounter for screening for other viral diseases: Secondary | ICD-10-CM | POA: Diagnosis not present

## 2020-07-18 ENCOUNTER — Encounter (HOSPITAL_COMMUNITY): Payer: Self-pay

## 2020-07-18 ENCOUNTER — Other Ambulatory Visit: Payer: Self-pay

## 2020-07-18 ENCOUNTER — Emergency Department (HOSPITAL_COMMUNITY): Payer: Medicare Other

## 2020-07-18 ENCOUNTER — Emergency Department (HOSPITAL_COMMUNITY)
Admission: EM | Admit: 2020-07-18 | Discharge: 2020-07-18 | Disposition: A | Discharge status: Home / Self Care | Payer: Medicare Other | Attending: Emergency Medicine | Admitting: Emergency Medicine

## 2020-07-18 DIAGNOSIS — R112 Nausea with vomiting, unspecified: Secondary | ICD-10-CM | POA: Diagnosis not present

## 2020-07-18 DIAGNOSIS — E860 Dehydration: Secondary | ICD-10-CM | POA: Insufficient documentation

## 2020-07-18 DIAGNOSIS — Z96652 Presence of left artificial knee joint: Secondary | ICD-10-CM | POA: Diagnosis not present

## 2020-07-18 DIAGNOSIS — N183 Chronic kidney disease, stage 3 unspecified: Secondary | ICD-10-CM | POA: Insufficient documentation

## 2020-07-18 DIAGNOSIS — I130 Hypertensive heart and chronic kidney disease with heart failure and stage 1 through stage 4 chronic kidney disease, or unspecified chronic kidney disease: Secondary | ICD-10-CM | POA: Diagnosis not present

## 2020-07-18 DIAGNOSIS — I5032 Chronic diastolic (congestive) heart failure: Secondary | ICD-10-CM | POA: Diagnosis not present

## 2020-07-18 DIAGNOSIS — R918 Other nonspecific abnormal finding of lung field: Secondary | ICD-10-CM | POA: Diagnosis not present

## 2020-07-18 DIAGNOSIS — Z79899 Other long term (current) drug therapy: Secondary | ICD-10-CM | POA: Insufficient documentation

## 2020-07-18 DIAGNOSIS — Z7901 Long term (current) use of anticoagulants: Secondary | ICD-10-CM | POA: Insufficient documentation

## 2020-07-18 DIAGNOSIS — R059 Cough, unspecified: Secondary | ICD-10-CM | POA: Diagnosis not present

## 2020-07-18 DIAGNOSIS — R531 Weakness: Secondary | ICD-10-CM | POA: Diagnosis not present

## 2020-07-18 DIAGNOSIS — U071 COVID-19: Secondary | ICD-10-CM | POA: Diagnosis not present

## 2020-07-18 DIAGNOSIS — Z87891 Personal history of nicotine dependence: Secondary | ICD-10-CM | POA: Diagnosis not present

## 2020-07-18 LAB — CBC WITH DIFFERENTIAL/PLATELET
Abs Immature Granulocytes: 0.05 10*3/uL (ref 0.00–0.07)
Basophils Absolute: 0 10*3/uL (ref 0.0–0.1)
Basophils Relative: 0 %
Eosinophils Absolute: 0 10*3/uL (ref 0.0–0.5)
Eosinophils Relative: 1 %
HCT: 34.7 % — ABNORMAL LOW (ref 36.0–46.0)
Hemoglobin: 10.1 g/dL — ABNORMAL LOW (ref 12.0–15.0)
Immature Granulocytes: 1 %
Lymphocytes Relative: 6 %
Lymphs Abs: 0.4 10*3/uL — ABNORMAL LOW (ref 0.7–4.0)
MCH: 23.4 pg — ABNORMAL LOW (ref 26.0–34.0)
MCHC: 29.1 g/dL — ABNORMAL LOW (ref 30.0–36.0)
MCV: 80.3 fL (ref 80.0–100.0)
Monocytes Absolute: 0.6 10*3/uL (ref 0.1–1.0)
Monocytes Relative: 8 %
Neutro Abs: 5.7 10*3/uL (ref 1.7–7.7)
Neutrophils Relative %: 84 %
Platelets: 291 10*3/uL (ref 150–400)
RBC: 4.32 MIL/uL (ref 3.87–5.11)
RDW: 18.7 % — ABNORMAL HIGH (ref 11.5–15.5)
WBC: 6.8 10*3/uL (ref 4.0–10.5)
nRBC: 0 % (ref 0.0–0.2)

## 2020-07-18 LAB — BRAIN NATRIURETIC PEPTIDE: B Natriuretic Peptide: 59.5 pg/mL (ref 0.0–100.0)

## 2020-07-18 LAB — COMPREHENSIVE METABOLIC PANEL
ALT: 16 U/L (ref 0–44)
AST: 44 U/L — ABNORMAL HIGH (ref 15–41)
Albumin: 3.3 g/dL — ABNORMAL LOW (ref 3.5–5.0)
Alkaline Phosphatase: 82 U/L (ref 38–126)
Anion gap: 13 (ref 5–15)
BUN: 20 mg/dL (ref 8–23)
CO2: 22 mmol/L (ref 22–32)
Calcium: 8 mg/dL — ABNORMAL LOW (ref 8.9–10.3)
Chloride: 102 mmol/L (ref 98–111)
Creatinine, Ser: 1.26 mg/dL — ABNORMAL HIGH (ref 0.44–1.00)
GFR, Estimated: 42 mL/min — ABNORMAL LOW (ref >60)
Glucose, Bld: 126 mg/dL — ABNORMAL HIGH (ref 70–99)
Potassium: 4.4 mmol/L (ref 3.5–5.1)
Sodium: 137 mmol/L (ref 135–145)
Total Bilirubin: 0.9 mg/dL (ref 0.3–1.2)
Total Protein: 6.1 g/dL — ABNORMAL LOW (ref 6.5–8.1)

## 2020-07-18 LAB — POC SARS CORONAVIRUS 2 AG -  ED: SARS Coronavirus 2 Ag: POSITIVE — AB

## 2020-07-18 MED ORDER — SODIUM CHLORIDE 0.9 % IV BOLUS: 1000.0000 mL | Freq: Once | INTRAVENOUS | Status: DC

## 2020-07-18 MED ORDER — ONDANSETRON HCL 4 MG/2ML IJ SOLN
4.0000 mg | Freq: Once | INTRAMUSCULAR | Status: AC
  Administered 2020-07-18: 4 mg via INTRAVENOUS
  Filled 2020-07-18: qty 2

## 2020-07-18 MED ORDER — SODIUM CHLORIDE 0.9 % IV BOLUS
1000.0000 mL | Freq: Once | INTRAVENOUS | Status: AC
  Administered 2020-07-18: 1000 mL via INTRAVENOUS

## 2020-07-18 NOTE — ED Notes (Signed)
Dr. Vanita Panda updated pt daughter, pt daughter is coming to get pt, pt is being discharged.

## 2020-07-18 NOTE — ED Notes (Signed)
An After Visit Summary was printed and given to the patient. Discharge instructions given and no further questions at this time.  Pt states daughter is driving her home. Pt waiting on daughter, will be assisted out via wheelchair.

## 2020-07-18 NOTE — Discharge Instructions (Addendum)
As discussed, your test results are concerning for mild dehydration in the context of your ongoing COVID infection. It is very important that you stay well-hydrated, and if you are having difficulty with this, do not hesitate to return here for additional evaluation.

## 2020-07-18 NOTE — ED Provider Notes (Signed)
[facility] DEPT Provider Note   CSN: [id number] Arrival date & time: 07/18/20  1615     History Chief Complaint  Patient presents with  . Covid Positive  . Cough    Chelsea Keller is a 86 y.o. female.  HPI Patient presents via EMS from home with concern for persistent COVID illness. Patient was diagnosed positive about 10 days ago, after being ill for about 10 days prior.  She notes that she has had ongoing cough, weakness, anorexia, nausea.  No vomiting. Is unclear if she has a fever.  She denies chest pain, denies focal abdominal pain. Seemingly the patient spoke with her physician today and was referred to the emergency department given her anorexia, concern for dehydration.     Past Medical History:  Diagnosis Date  . Allergy   . Anemia   . Anxiety   . Arthritis   . Arthritis of sacroiliac joint of both sides 11/12/2017  . Bilateral pulmonary embolism ([place]) 06/23/2017  . Chronic diastolic CHF (congestive heart failure) (HCC)    a. Echo 1/16:  mild LVH, EF normal, grade 1 DD, MAC  . Chronic venous insufficiency    chronic LE edema  . DDD (degenerative disc disease), lumbar 11/12/2017  . Degenerative joint disease (DJD) of hip, Bilateral  11/12/2017  . Depression   . Fibromyalgia    constant pain  . Hx of cardiac catheterization    a. [facility] in [place] "ok" per patient with mild plaque in a single vessel - records not available  . Hx of cardiovascular stress test    a. Nuclear study in 2008 normal  . Hx of colonic polyps   . Hypertension   . Hypertriglyceridemia   . Impaired fasting glucose   . PONV (postoperative nausea and vomiting)   . PUD (peptic ulcer disease)    hx of gastric ulcer  . Pulmonary emboli ([place]) 06/2017  . Vitamin B12 deficiency     Patient Active Problem List   Diagnosis Date Noted  . Fatigue 07/11/2020  . Post-nasal drip 07/11/2020  . Acute gout 04/25/2020  . Pruritic disorder 12/07/2019  . Anemia of chronic  disease 11/20/2019  . Acquired thrombophilia ([place]) 09/18/2019  . Recurrent pulmonary emboli ([place]) 06/18/2019  . Vertigo 01/12/2019  . Aortic atherosclerosis ([place]) 09/12/2018  . Iron deficiency anemia due to chronic blood loss 08/11/2018  . Gout 04/01/2018  . Angiodysplasia of colon   . Symptomatic anemia 03/21/2018  . Degenerative joint disease (DJD) of hip, Bilateral  11/12/2017  . DDD (degenerative disc disease), lumbar 11/12/2017  . Arthritis of sacroiliac joint of both sides 11/12/2017  . Chronic anticoagulation 11/07/2017  . Neurodermatitis 07/28/2017  . Urinary incontinence 05/26/2017  . Chronic diastolic CHF (congestive heart failure) ([place]) 04/03/2017  . Hypertension 04/03/2017  . Hypertriglyceridemia 04/03/2017  . Vitamin B12 deficiency 04/03/2017  . CKD (chronic kidney disease) stage 3, GFR 30-59 ml/min 04/03/2017  . Lumbar radiculopathy   . Advance directive discussed with patient 01/19/2017  . Mood disorder ([place]) 11/03/2013  . Psoriasis 02/21/2013  . Routine general medical examination at a health care facility 06/14/2012  . Morbid obesity ([place]) 12/02/2011  . VENTRICULAR HYPERTROPHY, LEFT 10/19/2008  . Sleep disturbance 10/25/2007  . Venous (peripheral) insufficiency 08/11/2007  . Osteoarthritis, multiple sites 08/11/2007  . Impaired fasting glucose 08/11/2007  . Hx of adenomatous colonic polyps 05/20/2007  . B12 DEFICIENCY 09/30/2006  . Essential hypertension 09/30/2006  . Allergic rhinitis due to pollen 09/30/2006  Past Surgical History:  Procedure Laterality Date  . ABDOMINAL HYSTERECTOMY    . CATARACT EXTRACTION  10/2003   OD  . CHOLECYSTECTOMY    . COLONOSCOPY W/ POLYPECTOMY    . COLONOSCOPY WITH PROPOFOL N/A 10/15/2014   Procedure: COLONOSCOPY WITH PROPOFOL;  Surgeon: Ladene Artist, MD;  Location: [facility] ENDOSCOPY;  Service: Endoscopy;  Laterality: N/A;  . COLONOSCOPY WITH PROPOFOL N/A 03/23/2018   Procedure: COLONOSCOPY WITH PROPOFOL;  Surgeon: Gatha Mayer, MD;  Location: [facility] ENDOSCOPY;  Service: Endoscopy;  Laterality: N/A;  . ESOPHAGOGASTRODUODENOSCOPY (EGD) WITH PROPOFOL N/A 10/15/2014   Procedure: ESOPHAGOGASTRODUODENOSCOPY (EGD) WITH PROPOFOL;  Surgeon: Ladene Artist, MD;  Location: [facility] ENDOSCOPY;  Service: Endoscopy;  Laterality: N/A;  . EXTERNAL FIXATION ANKLE FRACTURE     Fx.  left ankle-fixation with pins later removed sec to infection 1985  . EXTERNAL FIXATION WRIST FRACTURE  1985   left with pins  . EYE SURGERY    . FEMUR FRACTURE SURGERY  06/2009  . FRACTURE SURGERY    . HARDWARE REMOVAL Left 10/05/2013   Procedure: HARDWARE REMOVAL LEFT DISTAL FEMUR;  Surgeon: Rozanna Box, MD;  Location: [facility];  Service: Orthopedics;  Laterality: Left;  . HEMIARTHROPLASTY SHOULDER FRACTURE  06/2009  . HOT HEMOSTASIS N/A 03/23/2018   Procedure: HOT HEMOSTASIS (ARGON PLASMA COAGULATION/BICAP);  Surgeon: Gatha Mayer, MD;  Location: [facility] ENDOSCOPY;  Service: Endoscopy;  Laterality: N/A;  . JOINT REPLACEMENT    . STERIOD INJECTION Right 10/05/2013   Procedure: STEROID INJECTION;  Surgeon: Rozanna Box, MD;  Location: [facility];  Service: Orthopedics;  Laterality: Right;  . TONSILLECTOMY    . TONSILLECTOMY    . TOTAL KNEE ARTHROPLASTY  03/09   left     OB History   No obstetric history on file.     Family History  Problem Relation Age of Onset  . Depression Mother   . Cancer Mother        uterine cancer  . Heart attack Father   . Cancer Brother        prostate cancer  . Diabetes Maternal Aunt   . Arthritis Brother   . Asthma Brother   . Stroke Maternal Grandmother   . Pulmonary embolism Daughter     Social History   Tobacco Use  . Smoking status: Former Smoker    Packs/day: 1.00    Years: 40.00    Pack years: 40.00    Types: Cigarettes    Quit date: 07/06/1993    Years since quitting: 27.0  . Smokeless tobacco: Never Used  Vaping Use  . Vaping Use: Never used  Substance Use Topics  . Alcohol use: No    Alcohol/week:  0.0 standard drinks  . Drug use: No    Home Medications Prior to Admission medications   Medication Sig Start Date End Date Taking? Authorizing Provider  apixaban (ELIQUIS) 2.5 MG TABS tablet Take 1 tablet (2.5 mg total) by mouth 2 (two) times daily. 12/07/19  Yes Venia Carbon, MD  gabapentin (NEURONTIN) 300 MG capsule Take 1 capsule in the morning and 2 capsules at bedtime 07/11/20  Yes Venia Carbon, MD  KLOR-CON M20 20 MEQ tablet Take 20 mEq by mouth 2 (two) times daily. 02/22/20  Yes [provider]  triamterene-hydrochlorothiazide (MAXZIDE-25) 37.5-25 MG tablet TAKE 1 TABLET DAILY 04/24/20  Yes Venia Carbon, MD  acetaminophen (TYLENOL) 500 MG tablet Take 1,000-1,500 mg by mouth every 6 (six) hours as needed for  moderate pain or headache (pain).     [provider]  mometasone (ELOCON) 0.1 % ointment Apply to aa's rash QD-BID PRN. Avoid face, groin, and axilla. Patient taking differently: Apply 1 application topically 2 (two) times daily as needed (itching). Apply to aa's rash QD-BID PRN. Avoid face, groin, and axilla. 04/15/20   Ralene Bathe, MD  oxyCODONE-acetaminophen (PERCOCET/ROXICET) 5-325 MG tablet Take 1 tablet by mouth every 4 (four) hours as needed for severe pain. 04/25/20   Venia Carbon, MD  torsemide (DEMADEX) 20 MG tablet Take 1 tablet (20 mg total) by mouth daily. Patient taking differently: Take 20 mg by mouth daily as needed (fluid). 02/22/20   Venia Carbon, MD  traMADol (ULTRAM) 50 MG tablet Take 1 tablet (50 mg total) by mouth 3 (three) times daily as needed for moderate pain. 07/11/20   Venia Carbon, MD    Allergies    Codeine sulfate, Celecoxib, Erythromycin base, and Spironolactone  Review of Systems   Review of Systems  Constitutional:       Per HPI, otherwise negative  HENT:       Per HPI, otherwise negative  Respiratory:       Per HPI, otherwise negative  Cardiovascular:       Per HPI, otherwise negative   Gastrointestinal: Positive for nausea. Negative for vomiting.  Endocrine:       Negative aside from HPI  Genitourinary:       Neg aside from HPI   Musculoskeletal:       Per HPI, otherwise negative  Skin: Negative.   Neurological: Positive for weakness. Negative for syncope.    Physical Exam Updated Vital Signs BP (!) 151/78   Pulse 98   Temp 98.9 F (37.2 C) (Oral)   Resp 18   SpO2 100%   Physical Exam Vitals and nursing note reviewed.  Constitutional:      General: She is not in acute distress.    Appearance: She is well-developed and well-nourished. She is obese. She is ill-appearing. She is not toxic-appearing.  HENT:     Head: Normocephalic and atraumatic.  Eyes:     Extraocular Movements: EOM normal.     Conjunctiva/sclera: Conjunctivae normal.  Cardiovascular:     Rate and Rhythm: Regular rhythm. Tachycardia present.  Pulmonary:     Effort: Pulmonary effort is normal. No respiratory distress.     Breath sounds: Normal breath sounds. No stridor.  Abdominal:     General: There is no distension.     Tenderness: There is no abdominal tenderness. There is no guarding.  Musculoskeletal:        General: No edema.  Skin:    General: Skin is warm and dry.     Coloration: Skin is pale.     Comments: Dry, cracked skin on both feet  Neurological:     Mental Status: She is alert and oriented to person, place, and time.     Cranial Nerves: No cranial nerve deficit.  Psychiatric:        Mood and Affect: Mood and affect and mood normal.     ED Results / Procedures / Treatments   Labs (all labs ordered are listed, but only abnormal results are displayed) Labs Reviewed  COMPREHENSIVE METABOLIC PANEL - Abnormal; Notable for the following components:      Result Value   Glucose, Bld 126 (*)    Creatinine, Ser 1.26 (*)    Calcium 8.0 (*)    Total Protein  6.1 (*)    Albumin 3.3 (*)    AST 44 (*)    GFR, Estimated 42 (*)    All other components within normal limits   CBC WITH DIFFERENTIAL/PLATELET - Abnormal; Notable for the following components:   Hemoglobin 10.1 (*)    HCT 34.7 (*)    MCH 23.4 (*)    MCHC 29.1 (*)    RDW 18.7 (*)    Lymphs Abs 0.4 (*)    All other components within normal limits  POC SARS CORONAVIRUS 2 AG -  ED - Abnormal; Notable for the following components:   SARS Coronavirus 2 Ag POSITIVE (*)    All other components within normal limits  BRAIN NATRIURETIC PEPTIDE    EKG None  Radiology DG Chest Port 1 View  Result Date: 07/18/2020 CLINICAL DATA:  COVID positive now with cough. EXAM: PORTABLE CHEST 1 VIEW COMPARISON:  Multiple priors including most recent chest radiograph Nov 28, 2019 and chest CT June 18, 2019. FINDINGS: Stable cardiomegaly. Aortic atherosclerosis. Peripheral predominant interstitial opacities with perihilar opacities. The visualized skeletal structures are unchanged. IMPRESSION: Peripheral interstitial opacities with patchy perihilar opacity, as can be seen with COVID pneumonia. Electronically Signed   By: Dahlia Bailiff MD   On: 07/18/2020 17:42    Procedures Procedures (including critical care time)  Medications Ordered in ED Medications  sodium chloride 0.9 % bolus 1,000 mL (1,000 mLs Intravenous Patient Refused/Not Given 07/18/20 1959)  sodium chloride 0.9 % bolus 1,000 mL (0 mLs Intravenous Stopped 07/18/20 2004)  ondansetron (ZOFRAN) injection 4 mg (4 mg Intravenous Given 07/18/20 1711)    ED Course  I have reviewed the triage vital signs and the nursing notes.  Pertinent labs & imaging results that were available during my care of the patient were reviewed by me and considered in my medical decision making (see chart for details).     Monitor the patient has pulse oximetry 99% room air normal Cardiac 80/90 sinus unremarkable  MDM Rules/Calculators/A&P                          8:51 PM On repeat exam the patient is awake, alert, sitting upright, speaking clearly. She notes that she  feels better. We have discussed, reviewed today's x-ray, consistent with pneumonia, labs consistent with mild dehydration, otherwise labs are generally reassuring, vital signs remain reassuring. Patient declines additional fluid resuscitation, states that she would like to be discharged, drink fluids at home. Given the duration of symptoms, absence of need for oxygen, absence of hemodynamic instability, generally reassuring labs aside from mild dehydration, no indication for admission. Given the duration of symptoms, the patient not likely candidate for antibodies, nor remdesivir.  I discussed the patient's evaluation with her daughter via telephone, daughter is aware of the patient's impending discharge, will assist with transport. Final Clinical Impression(s) / ED Diagnoses Final diagnoses:  COVID  Dehydration   MDM Number of Diagnoses or Management Options COVID: established, worsening Dehydration: new, needed workup   Amount and/or Complexity of Data Reviewed Clinical lab tests: reviewed Tests in the radiology section of CPT: reviewed Tests in the medicine section of CPT: reviewed Decide to obtain previous medical records or to obtain history from someone other than the patient: yes Obtain history from someone other than the patient: yes Review and summarize past medical records: yes Independent visualization of images, tracings, or specimens: yes  Risk of Complications, Morbidity, and/or Mortality Presenting problems: high Diagnostic procedures:  high Management options: high  Critical Care Total time providing critical care: < 30 minutes  Patient Progress Patient progress: stable    Carmin Muskrat, MD 07/18/20 [phone]

## 2020-07-18 NOTE — ED Notes (Signed)
Sherrilyn Nairn, daughter, [phone], would like an update on her mom, like to visit if possible.

## 2020-07-18 NOTE — ED Notes (Addendum)
Dr. Vanita Panda aware pt has refused second bolus of NS 1000 mL. Dr. Vanita Panda aware pt wishes to be discharged now.

## 2020-07-18 NOTE — ED Triage Notes (Signed)
Pt BIB EMS from home. Pt lives with family. Pt has had COVID symptoms x3 weeks. Pt tested COVID+ 1/10. EMS reports pt stated her PCP told her to be evaluated in ED. Pt c/o dry mouth, cough. Pt denies SOB, denies chest pain. Pt has taken no tylenol today.   98% RA 90 HR Pt would not be still for BP

## 2020-07-18 NOTE — ED Notes (Signed)
Pt refuses to allow RN to place BP cuff anywhere but her forearm. Pt states "you can put in on my forearm or no where". Dr. Vanita Panda aware.

## 2020-07-18 NOTE — Telephone Encounter (Signed)
Daughter called back to Triage to let Dr. Silvio Pate know they are taking pt to hospital they wanted to make sure it's okay to take her to [facility] given covid diagnosis. Pt has declined severely and family is taking her to hospital now, she is shakey, no appetite, dehydrated, and is stool incontinent. I advised daughter if she wants to take pt to [facility] that is fine but given sxs I agree she should take pt to hospital asap. They are taking her now  FYI to PCP

## 2020-07-19 MED ORDER — ONDANSETRON HCL 4 MG PO TABS: 4.0000 mg | ORAL_TABLET | Freq: Three times a day (TID) | ORAL | 0 refills | Status: DC | PRN

## 2020-08-13 ENCOUNTER — Other Ambulatory Visit: Payer: Self-pay | Admitting: Orthopedic Surgery

## 2020-08-13 DIAGNOSIS — M47818 Spondylosis without myelopathy or radiculopathy, sacral and sacrococcygeal region: Secondary | ICD-10-CM

## 2020-08-14 ENCOUNTER — Ambulatory Visit
Admission: RE | Admit: 2020-08-14 | Discharge: 2020-08-14 | Disposition: A | Discharge status: Home / Self Care | Payer: Medicare Other | Source: Ambulatory Visit | Attending: Orthopedic Surgery | Admitting: Orthopedic Surgery

## 2020-08-14 ENCOUNTER — Other Ambulatory Visit: Payer: Self-pay

## 2020-08-14 DIAGNOSIS — M461 Sacroiliitis, not elsewhere classified: Secondary | ICD-10-CM | POA: Diagnosis not present

## 2020-08-14 DIAGNOSIS — M47818 Spondylosis without myelopathy or radiculopathy, sacral and sacrococcygeal region: Secondary | ICD-10-CM

## 2020-08-14 MED ORDER — IOPAMIDOL (ISOVUE-M 200) INJECTION 41%
1.0000 mL | Freq: Once | INTRAMUSCULAR | Status: AC
  Administered 2020-08-14: 1 mL via INTRA_ARTICULAR

## 2020-08-14 MED ORDER — METHYLPREDNISOLONE ACETATE 40 MG/ML INJ SUSP (RADIOLOG
120.0000 mg | Freq: Once | INTRAMUSCULAR | Status: AC
  Administered 2020-08-14: 120 mg via INTRALESIONAL

## 2020-08-14 NOTE — Discharge Instructions (Signed)

## 2020-08-16 MED ORDER — PREDNISONE 20 MG PO TABS: 40.0000 mg | ORAL_TABLET | Freq: Every day | ORAL | 0 refills | Status: DC

## 2020-08-19 ENCOUNTER — Other Ambulatory Visit: Payer: Self-pay | Admitting: Orthopedic Surgery

## 2020-08-19 DIAGNOSIS — M1611 Unilateral primary osteoarthritis, right hip: Secondary | ICD-10-CM

## 2020-08-27 NOTE — Assessment & Plan Note (Signed)
Hospitalization 08/11/2018: Severe GI bleed hemoglobin 4.9(internal hemorrhoids and angiodysplasia of the colon)Received blood transfusions IV iron treatment: September 2019, March 2020, Injectafer June 2020, Venofer January 2021  Toxicities of iron infusion:Patient gets profound diarrhea which last for 2 to 3 days afterFeraheme (and prescription for Lomotil for 3 days).With Venofer she did okay but she still had profound diarrhea as well as lethargic  10/10/2019: Hemoglobin 6.8: MCV 82, RDW 18:2 units of PRBC given on 10/11/2019. 11/03/2019: Hemoglobin 8.1 (iron studies and erythropoietin levels are pending) 11/20/2019: Hemoglobin 7.1, MCV 81.2 Iron studiesferritin 7, iron saturation 6%, absolute reticulocyte count 88.3 05/24/2020: Hemoglobin 10.4 06/24/2020: Hemoglobin 9.5  Blood transfusions: 11/29/2019, 01/02/2020  Because of her severe intolerance with the IV iron we are unable to do so. She will continue with oral iron.  Current treatment:Blood transfusions as needed We might consider blood transfusions if the hemoglobin drops below 7 or if she were to become symptomatic.  Return to clinic in 2 months with labs and blood transfusion if needed.

## 2020-08-27 NOTE — Progress Notes (Signed)
Patient Care Team: Venia Carbon, MD as PCP - General Angelena Form Annita Brod, MD as PCP - Cardiology (Cardiology) Sharmon Revere as Physician Assistant (Physician Assistant)  DIAGNOSIS:    ICD-10-CM   1. Iron deficiency anemia due to chronic blood loss  D50.0     CHIEF COMPLIANT: Follow-up of iron deficiency anemia and bilateral PEs  INTERVAL HISTORY: Chelsea Keller is a 86 y.o. with above-mentioned history of iron deficiency anemiapreviously treated with IV ironand who currently receives transfusions about every two months (last 04/19/20). She also has a history of bilateral PEs currently on Eliquis.She was diagnosed with COVID-19 in early January. Shepresentsto the clinictoday for follow-up.   She is in a wheelchair.  She thinks overall she is feeling better.  ALLERGIES:  is allergic to codeine sulfate, celecoxib, erythromycin base, and spironolactone.  MEDICATIONS:  Current Outpatient Medications  Medication Sig Dispense Refill  . acetaminophen (TYLENOL) 500 MG tablet Take 1,000-1,500 mg by mouth every 6 (six) hours as needed for moderate pain or headache (pain).     Marland Kitchen apixaban (ELIQUIS) 2.5 MG TABS tablet Take 1 tablet (2.5 mg total) by mouth 2 (two) times daily. 180 tablet 3  . gabapentin (NEURONTIN) 300 MG capsule Take 1 capsule in the morning and 2 capsules at bedtime 270 capsule 3  . KLOR-CON M20 20 MEQ tablet Take 20 mEq by mouth 2 (two) times daily.    . mometasone (ELOCON) 0.1 % ointment Apply to aa's rash QD-BID PRN. Avoid face, groin, and axilla. (Patient taking differently: Apply 1 application topically 2 (two) times daily as needed (itching). Apply to aa's rash QD-BID PRN. Avoid face, groin, and axilla.) 45 g 0  . ondansetron (ZOFRAN) 4 MG tablet Take 1 tablet (4 mg total) by mouth every 8 (eight) hours as needed for nausea or vomiting. 20 tablet 0  . oxyCODONE-acetaminophen (PERCOCET/ROXICET) 5-325 MG tablet Take 1 tablet by mouth every 4 (four) hours  as needed for severe pain. 10 tablet 0  . predniSONE (DELTASONE) 20 MG tablet Take 2 tablets (40 mg total) by mouth daily. For 5 days, then 1 tab daily for 5 days 15 tablet 0  . torsemide (DEMADEX) 20 MG tablet Take 1 tablet (20 mg total) by mouth daily. (Patient taking differently: Take 20 mg by mouth daily as needed (fluid).) 30 tablet 11  . traMADol (ULTRAM) 50 MG tablet Take 1 tablet (50 mg total) by mouth 3 (three) times daily as needed for moderate pain. 60 tablet 0  . triamterene-hydrochlorothiazide (MAXZIDE-25) 37.5-25 MG tablet TAKE 1 TABLET DAILY 90 tablet 3   No current facility-administered medications for this visit.    PHYSICAL EXAMINATION: ECOG PERFORMANCE STATUS: 1 - Symptomatic but completely ambulatory  Vitals:   08/28/20 1047  BP: (!) 163/56  Pulse: 83  Resp: 18  Temp: (!) 97.5 F (36.4 C)  SpO2: 98%   Filed Weights   08/28/20 1047  Weight: 253 lb 12.8 oz (115.1 kg)    LABORATORY DATA:  I have reviewed the data as listed CMP Latest Ref Rng & Units 07/18/2020 05/24/2020 03/18/2020  Glucose 70 - 99 mg/dL 126(H) 108(H) 134(H)  BUN 8 - 23 mg/dL 20 17 28(H)  Creatinine 0.44 - 1.00 mg/dL 1.26(H) 0.80 1.21(H)  Sodium 135 - 145 mmol/L 137 141 137  Potassium 3.5 - 5.1 mmol/L 4.4 4.5 4.0  Chloride 98 - 111 mmol/L 102 104 97  CO2 22 - 32 mmol/L 22 29 29   Calcium 8.9 - 10.3 mg/dL  8.0(L) 9.2 9.2  Total Protein 6.5 - 8.1 g/dL 6.1(L) - -  Total Bilirubin 0.3 - 1.2 mg/dL 0.9 - -  Alkaline Phos 38 - 126 U/L 82 - -  AST 15 - 41 U/L 44(H) - -  ALT 0 - 44 U/L 16 - -    Lab Results  Component Value Date   WBC 8.9 08/28/2020   HGB 8.9 (L) 08/28/2020   HCT 31.5 (L) 08/28/2020   MCV 81.4 08/28/2020   PLT 312 08/28/2020   NEUTROABS 6.2 08/28/2020    ASSESSMENT & PLAN:  Iron deficiency anemia due to chronic blood loss Hospitalization 08/11/2018: Severe GI bleed hemoglobin 4.9(internal hemorrhoids and angiodysplasia of the colon)Received blood transfusions IV iron  treatment: September 2019, March 2020, Injectafer June 2020, Venofer January 2021  Toxicities of iron infusion:Patient gets profound diarrhea which last for 2 to 3 days afterFeraheme (and prescription for Lomotil for 3 days).With Venofer she did okay but she still had profound diarrhea as well as lethargic  10/10/2019: Hemoglobin 6.8: MCV 82, RDW 18:2 units of PRBC given on 10/11/2019. 11/03/2019: Hemoglobin 8.1 (iron studies and erythropoietin levels are pending) 11/20/2019: Hemoglobin 7.1, MCV 81.2 Iron studiesferritin 7, iron saturation 6%, absolute reticulocyte count 88.3 05/24/2020: Hemoglobin 10.4 06/24/2020: Hemoglobin 9.5 08/28/2020: Hemoglobin 8.9  Blood transfusions: 11/29/2019, 01/02/2020, October 2021  Because of her severe intolerance with the IV iron we are unable to do so. She will continue with oral iron.  Current treatment:Blood transfusions as needed We might consider blood transfusions if the hemoglobin drops below 7 or if she were to become symptomatic. Today her hemoglobin is 8.9 and therefore she does not need any blood.  Return to clinic in 3 months with labs and blood transfusion if needed.     No orders of the defined types were placed in this encounter.  The patient has a good understanding of the overall plan. she agrees with it. she will call with any problems that may develop before the next visit here.  Total time spent: 20 mins including face to face time and time spent for planning, charting and coordination of care  Rulon Eisenmenger, MD, MPH 08/28/2020  I, Cloyde Reams Dorshimer, am acting as scribe for Dr. Nicholas Lose.  I have reviewed the above documentation for accuracy and completeness, and I agree with the above.

## 2020-08-28 ENCOUNTER — Inpatient Hospital Stay: Payer: Medicare Other

## 2020-08-28 ENCOUNTER — Telehealth: Payer: Self-pay | Admitting: Hematology and Oncology

## 2020-08-28 ENCOUNTER — Inpatient Hospital Stay: Payer: Medicare Other | Attending: Hematology and Oncology | Admitting: Hematology and Oncology

## 2020-08-28 ENCOUNTER — Other Ambulatory Visit: Payer: Self-pay

## 2020-08-28 DIAGNOSIS — Z79899 Other long term (current) drug therapy: Secondary | ICD-10-CM | POA: Diagnosis not present

## 2020-08-28 DIAGNOSIS — D500 Iron deficiency anemia secondary to blood loss (chronic): Secondary | ICD-10-CM

## 2020-08-28 DIAGNOSIS — Z8616 Personal history of COVID-19: Secondary | ICD-10-CM | POA: Insufficient documentation

## 2020-08-28 DIAGNOSIS — Z885 Allergy status to narcotic agent status: Secondary | ICD-10-CM | POA: Diagnosis not present

## 2020-08-28 DIAGNOSIS — Z881 Allergy status to other antibiotic agents status: Secondary | ICD-10-CM | POA: Insufficient documentation

## 2020-08-28 DIAGNOSIS — R5383 Other fatigue: Secondary | ICD-10-CM | POA: Diagnosis not present

## 2020-08-28 DIAGNOSIS — R197 Diarrhea, unspecified: Secondary | ICD-10-CM | POA: Diagnosis not present

## 2020-08-28 DIAGNOSIS — Z7952 Long term (current) use of systemic steroids: Secondary | ICD-10-CM | POA: Insufficient documentation

## 2020-08-28 DIAGNOSIS — K648 Other hemorrhoids: Secondary | ICD-10-CM | POA: Diagnosis not present

## 2020-08-28 DIAGNOSIS — Z7901 Long term (current) use of anticoagulants: Secondary | ICD-10-CM | POA: Insufficient documentation

## 2020-08-28 LAB — CBC WITH DIFFERENTIAL (CANCER CENTER ONLY)
Abs Immature Granulocytes: 0.06 10*3/uL (ref 0.00–0.07)
Basophils Absolute: 0.1 10*3/uL (ref 0.0–0.1)
Basophils Relative: 1 %
Eosinophils Absolute: 0.4 10*3/uL (ref 0.0–0.5)
Eosinophils Relative: 4 %
HCT: 31.5 % — ABNORMAL LOW (ref 36.0–46.0)
Hemoglobin: 8.9 g/dL — ABNORMAL LOW (ref 12.0–15.0)
Immature Granulocytes: 1 %
Lymphocytes Relative: 17 %
Lymphs Abs: 1.5 10*3/uL (ref 0.7–4.0)
MCH: 23 pg — ABNORMAL LOW (ref 26.0–34.0)
MCHC: 28.3 g/dL — ABNORMAL LOW (ref 30.0–36.0)
MCV: 81.4 fL (ref 80.0–100.0)
Monocytes Absolute: 0.7 10*3/uL (ref 0.1–1.0)
Monocytes Relative: 8 %
Neutro Abs: 6.2 10*3/uL (ref 1.7–7.7)
Neutrophils Relative %: 69 %
Platelet Count: 312 10*3/uL (ref 150–400)
RBC: 3.87 MIL/uL (ref 3.87–5.11)
RDW: 18.6 % — ABNORMAL HIGH (ref 11.5–15.5)
WBC Count: 8.9 10*3/uL (ref 4.0–10.5)
nRBC: 0 % (ref 0.0–0.2)

## 2020-08-28 LAB — SAMPLE TO BLOOD BANK

## 2020-08-28 LAB — IRON AND TIBC
Iron: 24 ug/dL — ABNORMAL LOW (ref 41–142)
Saturation Ratios: 6 % — ABNORMAL LOW (ref 21–57)
TIBC: 379 ug/dL (ref 236–444)
UIBC: 354 ug/dL (ref 120–384)

## 2020-08-28 LAB — FERRITIN: Ferritin: 8 ng/mL — ABNORMAL LOW (ref 11–307)

## 2020-08-28 NOTE — Telephone Encounter (Signed)
Scheduled appointments per 2/23 los. Spoke to patient who is aware of appointments dates and times.

## 2020-09-16 ENCOUNTER — Telehealth: Payer: Self-pay

## 2020-09-16 ENCOUNTER — Ambulatory Visit: Payer: Medicare Other | Admitting: Dermatology

## 2020-09-16 NOTE — Telephone Encounter (Signed)
Approvedtoday CaseId:[id number];Status:Approved;Review Type:Prior Auth;Coverage Start Date:08/17/2020;Coverage End Date:09/16/2021;

## 2020-09-19 ENCOUNTER — Other Ambulatory Visit: Payer: Self-pay

## 2020-09-19 ENCOUNTER — Ambulatory Visit (INDEPENDENT_AMBULATORY_CARE_PROVIDER_SITE_OTHER): Payer: Medicare Other | Admitting: Internal Medicine

## 2020-09-19 ENCOUNTER — Encounter: Payer: Self-pay | Admitting: Internal Medicine

## 2020-09-19 DIAGNOSIS — M19049 Primary osteoarthritis, unspecified hand: Secondary | ICD-10-CM | POA: Insufficient documentation

## 2020-09-19 DIAGNOSIS — M19041 Primary osteoarthritis, right hand: Secondary | ICD-10-CM | POA: Diagnosis not present

## 2020-09-19 MED ORDER — PREDNISONE 20 MG PO TABS: 40.0000 mg | ORAL_TABLET | Freq: Every day | ORAL | 0 refills | Status: DC

## 2020-09-19 NOTE — Progress Notes (Signed)
Subjective:    Patient ID: Chelsea Keller, female    DOB: 03/22/1935, 86 y.o.   MRN: [id number]  HPI Here due to right 2nd finger pain This visit occurred during the SARS-CoV-2 public health emergency.  Safety protocols were in place, including screening questions prior to the visit, additional usage of staff PPE, and extensive cleaning of exam room while observing appropriate contact time as indicated for disinfecting solutions.   Had significant redness and swelling of right 2nd finger Was purplish and very numb "Spongy" at the DIP Feels warm Started 2 weeks ago This has happened before---but usually resolves on its own Tried the colchicine--didn't help  Current Outpatient Medications on File Prior to Visit  Medication Sig Dispense Refill  . acetaminophen (TYLENOL) 500 MG tablet Take 1,000-1,500 mg by mouth every 6 (six) hours as needed for moderate pain or headache (pain).     Marland Kitchen apixaban (ELIQUIS) 2.5 MG TABS tablet Take 1 tablet (2.5 mg total) by mouth 2 (two) times daily. 180 tablet 3  . gabapentin (NEURONTIN) 300 MG capsule Take 1 capsule in the morning and 2 capsules at bedtime 270 capsule 3  . KLOR-CON M20 20 MEQ tablet Take 20 mEq by mouth 2 (two) times daily.    . mometasone (ELOCON) 0.1 % ointment Apply to aa's rash QD-BID PRN. Avoid face, groin, and axilla. (Patient taking differently: Apply 1 application topically 2 (two) times daily as needed (itching). Apply to aa's rash QD-BID PRN. Avoid face, groin, and axilla.) 45 g 0  . ondansetron (ZOFRAN) 4 MG tablet Take 1 tablet (4 mg total) by mouth every 8 (eight) hours as needed for nausea or vomiting. 20 tablet 0  . oxyCODONE-acetaminophen (PERCOCET/ROXICET) 5-325 MG tablet Take 1 tablet by mouth every 4 (four) hours as needed for severe pain. 10 tablet 0  . torsemide (DEMADEX) 20 MG tablet Take 1 tablet (20 mg total) by mouth daily. (Patient taking differently: Take 20 mg by mouth daily as needed (fluid).) 30 tablet 11  .  traMADol (ULTRAM) 50 MG tablet Take 1 tablet (50 mg total) by mouth 3 (three) times daily as needed for moderate pain. 60 tablet 0  . triamterene-hydrochlorothiazide (MAXZIDE-25) 37.5-25 MG tablet TAKE 1 TABLET DAILY 90 tablet 3   No current facility-administered medications on file prior to visit.    Allergies  Allergen Reactions  . Codeine Sulfate Shortness Of Breath  . Celecoxib Other (See Comments)    Caused vaginal bleeding  . Erythromycin Base Nausea And Vomiting  . Spironolactone Nausea Only and Other (See Comments)    Blurred vision, Upset stomach, lethargy    Past Medical History:  Diagnosis Date  . Allergy   . Anemia   . Anxiety   . Arthritis   . Arthritis of sacroiliac joint of both sides 11/12/2017  . Bilateral pulmonary embolism ([facility]) 06/23/2017  . Chronic diastolic CHF (congestive heart failure) (HCC)    a. Echo 1/16:  mild LVH, EF normal, grade 1 DD, MAC  . Chronic venous insufficiency    chronic LE edema  . DDD (degenerative disc disease), lumbar 11/12/2017  . Degenerative joint disease (DJD) of hip, Bilateral  11/12/2017  . Depression   . Fibromyalgia    constant pain  . Hx of cardiac catheterization    a. LHC in [place] "ok" per patient with mild plaque in a single vessel - records not available  . Hx of cardiovascular stress test    a. Nuclear study in 2008 normal  .  Hx of colonic polyps   . Hypertension   . Hypertriglyceridemia   . Impaired fasting glucose   . PONV (postoperative nausea and vomiting)   . PUD (peptic ulcer disease)    hx of gastric ulcer  . Pulmonary emboli ([facility]) 06/2017  . Vitamin B12 deficiency     Past Surgical History:  Procedure Laterality Date  . ABDOMINAL HYSTERECTOMY    . CATARACT EXTRACTION  10/2003   OD  . CHOLECYSTECTOMY    . COLONOSCOPY W/ POLYPECTOMY    . COLONOSCOPY WITH PROPOFOL N/A 10/15/2014   Procedure: COLONOSCOPY WITH PROPOFOL;  Surgeon: Ladene Artist, MD;  Location: [facility] ENDOSCOPY;  Service: Endoscopy;   Laterality: N/A;  . COLONOSCOPY WITH PROPOFOL N/A 03/23/2018   Procedure: COLONOSCOPY WITH PROPOFOL;  Surgeon: Gatha Mayer, MD;  Location: [facility] ENDOSCOPY;  Service: Endoscopy;  Laterality: N/A;  . ESOPHAGOGASTRODUODENOSCOPY (EGD) WITH PROPOFOL N/A 10/15/2014   Procedure: ESOPHAGOGASTRODUODENOSCOPY (EGD) WITH PROPOFOL;  Surgeon: Ladene Artist, MD;  Location: [facility] ENDOSCOPY;  Service: Endoscopy;  Laterality: N/A;  . EXTERNAL FIXATION ANKLE FRACTURE     Fx.  left ankle-fixation with pins later removed sec to infection 1985  . EXTERNAL FIXATION WRIST FRACTURE  1985   left with pins  . EYE SURGERY    . FEMUR FRACTURE SURGERY  06/2009  . FRACTURE SURGERY    . HARDWARE REMOVAL Left 10/05/2013   Procedure: HARDWARE REMOVAL LEFT DISTAL FEMUR;  Surgeon: Rozanna Box, MD;  Location: [facility];  Service: Orthopedics;  Laterality: Left;  . HEMIARTHROPLASTY SHOULDER FRACTURE  06/2009  . HOT HEMOSTASIS N/A 03/23/2018   Procedure: HOT HEMOSTASIS (ARGON PLASMA COAGULATION/BICAP);  Surgeon: Gatha Mayer, MD;  Location: [facility] ENDOSCOPY;  Service: Endoscopy;  Laterality: N/A;  . JOINT REPLACEMENT    . STERIOD INJECTION Right 10/05/2013   Procedure: STEROID INJECTION;  Surgeon: Rozanna Box, MD;  Location: [facility];  Service: Orthopedics;  Laterality: Right;  . TONSILLECTOMY    . TONSILLECTOMY    . TOTAL KNEE ARTHROPLASTY  03/09   left    Family History  Problem Relation Age of Onset  . Depression Mother   . Cancer Mother        uterine cancer  . Heart attack Father   . Cancer Brother        prostate cancer  . Diabetes Maternal Aunt   . Arthritis Brother   . Asthma Brother   . Stroke Maternal Grandmother   . Pulmonary embolism Daughter     Social History   Socioeconomic History  . Marital status: Widowed    Spouse name: Not on file  . Number of children: 4  . Years of education: Not on file  . Highest education level: Not on file  Occupational History  . Occupation: retired crossing guard  .  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 Smoking status: Former Smoker    Packs/day: 1.00    Years: 40.00    Pack years: 40.00    Types: Cigarettes    Quit date: 07/06/1993    Years since quitting: 27.2  . Smokeless tobacco: Never Used  Vaping Use  . Vaping Use: Never used  Substance and Sexual Activity  . Alcohol use: No    Alcohol/week: 0.0 standard drinks  . Drug use: No  . Sexual activity: Not Currently  Other Topics Concern  . 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 [place]   Widow, lives w/ daughter and adult grandsons   Former smoker, no EtOH      No living will   Plans to do health care POA forms---wants daughter [name]   Would accept resuscitation attempts---no prolonged ventilation   Absolutely no feeding tube   Social Determinants of Health   Financial Resource Strain: Not on file  Food Insecurity: Not on file  Transportation Needs: Not on file  Physical Activity: Not on file  Stress: Not on file  Social Connections: Not on file  Intimate Partner Violence: Not on file   Review of Systems  No fever No trauma to finger     Objective:   Physical Exam Constitutional:      General: She is not in acute distress. Musculoskeletal:     Comments: Some spongy swelling with tenderness and slight warmth at right 2nd DIP (radial side) All other joints are quiet in hands  Neurological:     Mental Status: She is alert.            Assessment & Plan:

## 2020-09-19 NOTE — Assessment & Plan Note (Signed)
Has isolated synovitis in right 2nd DIP This is not consistent with gout---but still need to consider this Never did stop the maxzide in October---and no clear gout since then Location most like OA flare--but the mild swelling/inflammation is unusual  Will try brief course of prednisone If any recurrence of joint issues, will probably need to stop the maxzide

## 2020-10-07 ENCOUNTER — Encounter: Payer: Self-pay | Admitting: Hematology and Oncology

## 2020-10-07 ENCOUNTER — Other Ambulatory Visit: Payer: Self-pay | Admitting: *Deleted

## 2020-10-07 DIAGNOSIS — D500 Iron deficiency anemia secondary to blood loss (chronic): Secondary | ICD-10-CM

## 2020-10-10 ENCOUNTER — Ambulatory Visit: Payer: Medicare Other | Admitting: Internal Medicine

## 2020-10-10 NOTE — Progress Notes (Signed)
Patient Care Team: Venia Carbon, MD as PCP - General Angelena Form Annita Brod, MD as PCP - Cardiology (Cardiology) Sharmon Revere as Physician Assistant (Physician Assistant)  DIAGNOSIS:    ICD-10-CM   1. Iron deficiency anemia due to chronic blood loss  D50.0     CHIEF COMPLIANT: Follow-up of iron deficiency anemia and bilateral PEs  INTERVAL HISTORY: Chelsea Keller is a 86 y.o. with above-mentioned history of iron deficiency anemiapreviously treated with IV ironand who currently receives transfusions about every two months (last 04/19/20). She also has a history ofbilateral PEs currently on Eliquis.Shepresentsto the clinictoday for follow-up.    She is extremely short of breath and fatigue.  Oddly she cannot sleep and she has not slept for the past 28 hours.  ALLERGIES:  is allergic to codeine sulfate, celecoxib, erythromycin base, and spironolactone.  MEDICATIONS:  Current Outpatient Medications  Medication Sig Dispense Refill  . acetaminophen (TYLENOL) 500 MG tablet Take 1,000-1,500 mg by mouth every 6 (six) hours as needed for moderate pain or headache (pain).     Marland Kitchen apixaban (ELIQUIS) 2.5 MG TABS tablet Take 1 tablet (2.5 mg total) by mouth 2 (two) times daily. 180 tablet 3  . gabapentin (NEURONTIN) 300 MG capsule Take 1 capsule in the morning and 2 capsules at bedtime 270 capsule 3  . KLOR-CON M20 20 MEQ tablet Take 20 mEq by mouth 2 (two) times daily.    . mometasone (ELOCON) 0.1 % ointment Apply to aa's rash QD-BID PRN. Avoid face, groin, and axilla. (Patient taking differently: Apply 1 application topically 2 (two) times daily as needed (itching). Apply to aa's rash QD-BID PRN. Avoid face, groin, and axilla.) 45 g 0  . ondansetron (ZOFRAN) 4 MG tablet Take 1 tablet (4 mg total) by mouth every 8 (eight) hours as needed for nausea or vomiting. 20 tablet 0  . oxyCODONE-acetaminophen (PERCOCET/ROXICET) 5-325 MG tablet Take 1 tablet by mouth every 4 (four) hours as  needed for severe pain. 10 tablet 0  . predniSONE (DELTASONE) 20 MG tablet Take 2 tablets (40 mg total) by mouth daily. For 3 days and then 1 tab daily for 3 days. 9 tablet 0  . torsemide (DEMADEX) 20 MG tablet Take 1 tablet (20 mg total) by mouth daily. (Patient taking differently: Take 20 mg by mouth daily as needed (fluid).) 30 tablet 11  . traMADol (ULTRAM) 50 MG tablet Take 1 tablet (50 mg total) by mouth 3 (three) times daily as needed for moderate pain. 60 tablet 0  . triamterene-hydrochlorothiazide (MAXZIDE-25) 37.5-25 MG tablet TAKE 1 TABLET DAILY 90 tablet 3   No current facility-administered medications for this visit.    PHYSICAL EXAMINATION: ECOG PERFORMANCE STATUS: 3 - Symptomatic, >50% confined to bed  Vitals:   10/11/20 1132  BP: (!) 181/66  Pulse: 86  Resp: 17  Temp: (!) 97.5 F (36.4 C)  SpO2: 100%   Filed Weights   10/11/20 1132  Weight: 261 lb 8 oz (118.6 kg)    LABORATORY DATA:  I have reviewed the data as listed CMP Latest Ref Rng & Units 07/18/2020 05/24/2020 03/18/2020  Glucose 70 - 99 mg/dL 126(H) 108(H) 134(H)  BUN 8 - 23 mg/dL 20 17 28(H)  Creatinine 0.44 - 1.00 mg/dL 1.26(H) 0.80 1.21(H)  Sodium 135 - 145 mmol/L 137 141 137  Potassium 3.5 - 5.1 mmol/L 4.4 4.5 4.0  Chloride 98 - 111 mmol/L 102 104 97  CO2 22 - 32 mmol/L 22 29 29   Calcium  8.9 - 10.3 mg/dL 8.0(L) 9.2 9.2  Total Protein 6.5 - 8.1 g/dL 6.1(L) - -  Total Bilirubin 0.3 - 1.2 mg/dL 0.9 - -  Alkaline Phos 38 - 126 U/L 82 - -  AST 15 - 41 U/L 44(H) - -  ALT 0 - 44 U/L 16 - -    Lab Results  Component Value Date   WBC 9.3 10/11/2020   HGB 8.0 (L) 10/11/2020   HCT 28.6 (L) 10/11/2020   MCV 77.9 (L) 10/11/2020   PLT 275 10/11/2020   NEUTROABS 6.8 10/11/2020    ASSESSMENT & PLAN:  Iron deficiency anemia due to chronic blood loss Hospitalization 08/11/2018: Severe GI bleed hemoglobin 4.9(internal hemorrhoids and angiodysplasia of the colon)Received blood transfusions IV iron  treatment: September 2019, March 2020, Injectafer June 2020, Venofer January 2021  Toxicities of iron infusion:Patient gets profound diarrhea which last for 2 to 3 days afterFeraheme (and prescription for Lomotil for 3 days).With Venofer she did okay but she still had profound diarrhea as well as lethargic  10/10/2019: Hemoglobin 6.8: MCV 82, RDW 18:2 units of PRBC given on 10/11/2019. 11/03/2019: Hemoglobin 8.1 (iron studies and erythropoietin levels are pending) 11/20/2019: Hemoglobin 7.1, MCV 81.2 Iron studiesferritin 7, iron saturation 6%, absolute reticulocyte count 88.3 05/24/2020: Hemoglobin 10.4 06/24/2020: Hemoglobin 9.5 08/28/2020: Hemoglobin 8.9 10/11/2020: Hemoglobin 8, ferritin 7  Blood transfusions: 11/29/2019, 01/02/2020, October 2021  Because of her severe intolerance with the IV iron we are unable to do so. She will continue with oral iron.  Current treatment:Blood transfusions as needed Because she is very symptomatic I recommended that she get 2 units of PRBC. In spite of low ferritin we cannot give intravenous iron therapy because she is intolerant to IV iron.  Return to clinic in 3 months with labs and blood transfusion if needed.    No orders of the defined types were placed in this encounter.  The patient has a good understanding of the overall plan. she agrees with it. she will call with any problems that may develop before the next visit here.  Total time spent: 20 mins including face to face time and time spent for planning, charting and coordination of care  Rulon Eisenmenger, MD, MPH 10/11/2020  I, Molly Dorshimer, am acting as scribe for Dr. Nicholas Lose.  I have reviewed the above documentation for accuracy and completeness, and I agree with the above.

## 2020-10-11 ENCOUNTER — Other Ambulatory Visit: Payer: Self-pay

## 2020-10-11 ENCOUNTER — Inpatient Hospital Stay (HOSPITAL_BASED_OUTPATIENT_CLINIC_OR_DEPARTMENT_OTHER): Payer: Medicare Other | Admitting: Hematology and Oncology

## 2020-10-11 ENCOUNTER — Inpatient Hospital Stay: Payer: Medicare Other

## 2020-10-11 ENCOUNTER — Inpatient Hospital Stay: Payer: Medicare Other | Attending: Hematology and Oncology

## 2020-10-11 DIAGNOSIS — D649 Anemia, unspecified: Secondary | ICD-10-CM

## 2020-10-11 DIAGNOSIS — R197 Diarrhea, unspecified: Secondary | ICD-10-CM | POA: Diagnosis not present

## 2020-10-11 DIAGNOSIS — Z79899 Other long term (current) drug therapy: Secondary | ICD-10-CM | POA: Insufficient documentation

## 2020-10-11 DIAGNOSIS — R5383 Other fatigue: Secondary | ICD-10-CM | POA: Insufficient documentation

## 2020-10-11 DIAGNOSIS — D500 Iron deficiency anemia secondary to blood loss (chronic): Secondary | ICD-10-CM | POA: Insufficient documentation

## 2020-10-11 DIAGNOSIS — Z881 Allergy status to other antibiotic agents status: Secondary | ICD-10-CM | POA: Insufficient documentation

## 2020-10-11 DIAGNOSIS — Z7901 Long term (current) use of anticoagulants: Secondary | ICD-10-CM | POA: Insufficient documentation

## 2020-10-11 DIAGNOSIS — K648 Other hemorrhoids: Secondary | ICD-10-CM | POA: Insufficient documentation

## 2020-10-11 DIAGNOSIS — R0602 Shortness of breath: Secondary | ICD-10-CM | POA: Insufficient documentation

## 2020-10-11 DIAGNOSIS — Z885 Allergy status to narcotic agent status: Secondary | ICD-10-CM | POA: Diagnosis not present

## 2020-10-11 LAB — CBC WITH DIFFERENTIAL (CANCER CENTER ONLY)
Abs Immature Granulocytes: 0.09 10*3/uL — ABNORMAL HIGH (ref 0.00–0.07)
Basophils Absolute: 0 10*3/uL (ref 0.0–0.1)
Basophils Relative: 0 %
Eosinophils Absolute: 0.3 10*3/uL (ref 0.0–0.5)
Eosinophils Relative: 3 %
HCT: 28.6 % — ABNORMAL LOW (ref 36.0–46.0)
Hemoglobin: 8 g/dL — ABNORMAL LOW (ref 12.0–15.0)
Immature Granulocytes: 1 %
Lymphocytes Relative: 14 %
Lymphs Abs: 1.3 10*3/uL (ref 0.7–4.0)
MCH: 21.8 pg — ABNORMAL LOW (ref 26.0–34.0)
MCHC: 28 g/dL — ABNORMAL LOW (ref 30.0–36.0)
MCV: 77.9 fL — ABNORMAL LOW (ref 80.0–100.0)
Monocytes Absolute: 0.8 10*3/uL (ref 0.1–1.0)
Monocytes Relative: 8 %
Neutro Abs: 6.8 10*3/uL (ref 1.7–7.7)
Neutrophils Relative %: 74 %
Platelet Count: 275 10*3/uL (ref 150–400)
RBC: 3.67 MIL/uL — ABNORMAL LOW (ref 3.87–5.11)
RDW: 18.7 % — ABNORMAL HIGH (ref 11.5–15.5)
WBC Count: 9.3 10*3/uL (ref 4.0–10.5)
nRBC: 0.3 % — ABNORMAL HIGH (ref 0.0–0.2)

## 2020-10-11 LAB — IRON AND TIBC
Iron: 17 ug/dL — ABNORMAL LOW (ref 41–142)
Saturation Ratios: 4 % — ABNORMAL LOW (ref 21–57)
TIBC: 428 ug/dL (ref 236–444)
UIBC: 410 ug/dL — ABNORMAL HIGH (ref 120–384)

## 2020-10-11 LAB — CMP (CANCER CENTER ONLY)
ALT: 15 U/L (ref 0–44)
AST: 22 U/L (ref 15–41)
Albumin: 3.3 g/dL — ABNORMAL LOW (ref 3.5–5.0)
Alkaline Phosphatase: 97 U/L (ref 38–126)
Anion gap: 13 (ref 5–15)
BUN: 23 mg/dL (ref 8–23)
CO2: 23 mmol/L (ref 22–32)
Calcium: 8.6 mg/dL — ABNORMAL LOW (ref 8.9–10.3)
Chloride: 108 mmol/L (ref 98–111)
Creatinine: 0.91 mg/dL (ref 0.44–1.00)
GFR, Estimated: >60 mL/min (ref >60)
Glucose, Bld: 115 mg/dL — ABNORMAL HIGH (ref 70–99)
Potassium: 3.7 mmol/L (ref 3.5–5.1)
Sodium: 144 mmol/L (ref 135–145)
Total Bilirubin: 0.6 mg/dL (ref 0.3–1.2)
Total Protein: 5.9 g/dL — ABNORMAL LOW (ref 6.5–8.1)

## 2020-10-11 LAB — FERRITIN: Ferritin: 7 ng/mL — ABNORMAL LOW (ref 11–307)

## 2020-10-11 LAB — SAMPLE TO BLOOD BANK

## 2020-10-11 LAB — PREPARE RBC (CROSSMATCH)

## 2020-10-11 MED ORDER — TRAMADOL HCL 50 MG PO TABS: 50.0000 mg | ORAL_TABLET | Freq: Three times a day (TID) | ORAL | 0 refills | Status: DC | PRN

## 2020-10-11 MED ORDER — DIPHENHYDRAMINE HCL 25 MG PO CAPS
ORAL_CAPSULE | ORAL | Status: AC
  Filled 2020-10-11: qty 1

## 2020-10-11 MED ORDER — DIPHENHYDRAMINE HCL 25 MG PO CAPS: 25.0000 mg | ORAL_CAPSULE | Freq: Once | ORAL | Status: DC

## 2020-10-11 MED ORDER — SODIUM CHLORIDE 0.9% IV SOLUTION
250.0000 mL | Freq: Once | INTRAVENOUS | Status: DC
  Filled 2020-10-11: qty 250

## 2020-10-11 MED ORDER — SODIUM CHLORIDE 0.9 % IV SOLN
INTRAVENOUS | Status: DC
  Filled 2020-10-11: qty 250

## 2020-10-11 MED ORDER — DIPHENHYDRAMINE HCL 25 MG PO CAPS
25.0000 mg | ORAL_CAPSULE | Freq: Once | ORAL | Status: AC
  Administered 2020-10-11: 25 mg via ORAL

## 2020-10-11 NOTE — Telephone Encounter (Signed)
Last filled 07-11-20 #60 Last OV 09-19-20 Next OV 11-12-20 [facility]

## 2020-10-11 NOTE — Assessment & Plan Note (Signed)
Hospitalization 08/11/2018: Severe GI bleed hemoglobin 4.9(internal hemorrhoids and angiodysplasia of the colon)Received blood transfusions IV iron treatment: September 2019, March 2020, Injectafer June 2020, Venofer January 2021  Toxicities of iron infusion:Patient gets profound diarrhea which last for 2 to 3 days afterFeraheme (and prescription for Lomotil for 3 days).With Venofer she did okay but she still had profound diarrhea as well as lethargic  10/10/2019: Hemoglobin 6.8: MCV 82, RDW 18:2 units of PRBC given on 10/11/2019. 11/03/2019: Hemoglobin 8.1 (iron studies and erythropoietin levels are pending) 11/20/2019: Hemoglobin 7.1, MCV 81.2 Iron studiesferritin 7, iron saturation 6%, absolute reticulocyte count 88.3 05/24/2020: Hemoglobin 10.4 06/24/2020: Hemoglobin 9.5 08/28/2020: Hemoglobin 8.9  Blood transfusions: 11/29/2019, 01/02/2020, October 2021  Because of her severe intolerance with the IV iron we are unable to do so. She will continue with oral iron.  Current treatment:Blood transfusions as needed We might consider blood transfusions if the hemoglobin drops below 7 or if she were to become symptomatic. Today her hemoglobin is 8.9 and therefore she does not need any blood.  Return to clinic in 3 months with labs and blood transfusion if needed.

## 2020-10-11 NOTE — Patient Instructions (Signed)

## 2020-10-12 LAB — TYPE AND SCREEN
ABO/RH(D): B POS
Antibody Screen: NEGATIVE
Unit division: 0
Unit division: 0

## 2020-10-12 LAB — BPAM RBC
Blood Product Expiration Date: 202204282359
Blood Product Expiration Date: 202204292359
ISSUE DATE / TIME: 202204081328
ISSUE DATE / TIME: 202204081328
Unit Type and Rh: 1700
Unit Type and Rh: 7300

## 2020-10-13 ENCOUNTER — Other Ambulatory Visit: Payer: Self-pay | Admitting: Internal Medicine

## 2020-10-16 ENCOUNTER — Encounter (HOSPITAL_COMMUNITY): Payer: Self-pay

## 2020-10-16 ENCOUNTER — Ambulatory Visit (HOSPITAL_COMMUNITY)
Admission: RE | Admit: 2020-10-16 | Discharge: 2020-10-16 | Disposition: A | Discharge status: Home / Self Care | Payer: Medicare Other | Source: Ambulatory Visit | Attending: Emergency Medicine | Admitting: Emergency Medicine

## 2020-10-16 ENCOUNTER — Other Ambulatory Visit: Payer: Self-pay

## 2020-10-16 ENCOUNTER — Ambulatory Visit (INDEPENDENT_AMBULATORY_CARE_PROVIDER_SITE_OTHER): Payer: Medicare Other

## 2020-10-16 VITALS — BP 127/64 | HR 103 | Temp 98.9°F | Resp 21

## 2020-10-16 DIAGNOSIS — Z87891 Personal history of nicotine dependence: Secondary | ICD-10-CM

## 2020-10-16 DIAGNOSIS — R059 Cough, unspecified: Secondary | ICD-10-CM

## 2020-10-16 DIAGNOSIS — R0602 Shortness of breath: Secondary | ICD-10-CM | POA: Diagnosis not present

## 2020-10-16 DIAGNOSIS — R5383 Other fatigue: Secondary | ICD-10-CM

## 2020-10-16 LAB — COMPREHENSIVE METABOLIC PANEL
ALT: 19 U/L (ref 0–44)
AST: 36 U/L (ref 15–41)
Albumin: 3.3 g/dL — ABNORMAL LOW (ref 3.5–5.0)
Alkaline Phosphatase: 81 U/L (ref 38–126)
Anion gap: 10 (ref 5–15)
BUN: 17 mg/dL (ref 8–23)
CO2: 22 mmol/L (ref 22–32)
Calcium: 8.8 mg/dL — ABNORMAL LOW (ref 8.9–10.3)
Chloride: 103 mmol/L (ref 98–111)
Creatinine, Ser: 1.31 mg/dL — ABNORMAL HIGH (ref 0.44–1.00)
GFR, Estimated: 40 mL/min — ABNORMAL LOW (ref >60)
Glucose, Bld: 102 mg/dL — ABNORMAL HIGH (ref 70–99)
Potassium: 4 mmol/L (ref 3.5–5.1)
Sodium: 135 mmol/L (ref 135–145)
Total Bilirubin: 0.9 mg/dL (ref 0.3–1.2)
Total Protein: 5.8 g/dL — ABNORMAL LOW (ref 6.5–8.1)

## 2020-10-16 LAB — CBC WITH DIFFERENTIAL/PLATELET
Abs Immature Granulocytes: 0.06 10*3/uL (ref 0.00–0.07)
Basophils Absolute: 0 10*3/uL (ref 0.0–0.1)
Basophils Relative: 0 %
Eosinophils Absolute: 0.4 10*3/uL (ref 0.0–0.5)
Eosinophils Relative: 4 %
HCT: 37.3 % (ref 36.0–46.0)
Hemoglobin: 10.9 g/dL — ABNORMAL LOW (ref 12.0–15.0)
Immature Granulocytes: 1 %
Lymphocytes Relative: 17 %
Lymphs Abs: 1.5 10*3/uL (ref 0.7–4.0)
MCH: 23.4 pg — ABNORMAL LOW (ref 26.0–34.0)
MCHC: 29.2 g/dL — ABNORMAL LOW (ref 30.0–36.0)
MCV: 80 fL (ref 80.0–100.0)
Monocytes Absolute: 0.9 10*3/uL (ref 0.1–1.0)
Monocytes Relative: 10 %
Neutro Abs: 6.1 10*3/uL (ref 1.7–7.7)
Neutrophils Relative %: 68 %
Platelets: 335 10*3/uL (ref 150–400)
RBC: 4.66 MIL/uL (ref 3.87–5.11)
RDW: 19.6 % — ABNORMAL HIGH (ref 11.5–15.5)
WBC: 9 10*3/uL (ref 4.0–10.5)
nRBC: 0 % (ref 0.0–0.2)

## 2020-10-16 LAB — BRAIN NATRIURETIC PEPTIDE: B Natriuretic Peptide: 17.6 pg/mL (ref 0.0–100.0)

## 2020-10-16 MED ORDER — AMOXICILLIN-POT CLAVULANATE 875-125 MG PO TABS: 1.0000 | ORAL_TABLET | Freq: Two times a day (BID) | ORAL | 0 refills | Status: DC

## 2020-10-16 NOTE — ED Provider Notes (Signed)
[place]    CSN: [id number] Arrival date & time: 10/16/20  1459      History   Chief Complaint Chief Complaint  Patient presents with  . Shortness of Breath  . Cough  . Fever  . Fatigue    HPI Chelsea Keller is a 86 y.o. female.   Chelsea Keller presents with complaints of cough and shortness of breath with intermittent fevers which started around 3 weeks ago. Nausea, no vomiting. Loose stools. Cough is productive of sputum. Lower extremity edema has been improved. Had covid-19 in January of this year. No known ill contacts. Has been checking pulse ox at home and has been around 95% on RA. History of CHF. History of PE. Denies chest pain. History of cardiac cath. Chelsea Keller is on eliquis. Chelsea Keller is prescribed lasix but doesn't take this. No known COPD history. Chelsea Keller sleeps in a recliner at baseline, no change to sleep and breathing.     ROS per HPI, negative if not otherwise mentioned.      Past Medical History:  Diagnosis Date  . Allergy   . Anemia   . Anxiety   . Arthritis   . Arthritis of sacroiliac joint of both sides 11/12/2017  . Bilateral pulmonary embolism ([place]) 06/23/2017  . Chronic diastolic CHF (congestive heart failure) (HCC)    a. Echo 1/16:  mild LVH, EF normal, grade 1 DD, MAC  . Chronic venous insufficiency    chronic LE edema  . DDD (degenerative disc disease), lumbar 11/12/2017  . Degenerative joint disease (DJD) of hip, Bilateral  11/12/2017  . Depression   . Fibromyalgia    constant pain  . Hx of cardiac catheterization    a. LHC in [place] "ok" per patient with mild plaque in a single vessel - records not available  . Hx of cardiovascular stress test    a. Nuclear study in 2008 normal  . Hx of colonic polyps   . Hypertension   . Hypertriglyceridemia   . Impaired fasting glucose   . PONV (postoperative nausea and vomiting)   . PUD (peptic ulcer disease)    hx of gastric ulcer  . Pulmonary emboli ([place]) 06/2017  . Vitamin B12 deficiency      Patient Active Problem List   Diagnosis Date Noted  . Inflammation of joint of finger 09/19/2020  . Fatigue 07/11/2020  . Post-nasal drip 07/11/2020  . Acute gout 04/25/2020  . Pruritic disorder 12/07/2019  . Anemia of chronic disease 11/20/2019  . Acquired thrombophilia ([place]) 09/18/2019  . Recurrent pulmonary emboli ([place]) 06/18/2019  . Vertigo 01/12/2019  . Aortic atherosclerosis ([place]) 09/12/2018  . Iron deficiency anemia due to chronic blood loss 08/11/2018  . Gout 04/01/2018  . Angiodysplasia of colon   . Symptomatic anemia 03/21/2018  . Degenerative joint disease (DJD) of hip, Bilateral  11/12/2017  . DDD (degenerative disc disease), lumbar 11/12/2017  . Arthritis of sacroiliac joint of both sides 11/12/2017  . Chronic anticoagulation 11/07/2017  . Neurodermatitis 07/28/2017  . Urinary incontinence 05/26/2017  . Chronic diastolic CHF (congestive heart failure) ([place]) 04/03/2017  . Hypertension 04/03/2017  . Hypertriglyceridemia 04/03/2017  . Vitamin B12 deficiency 04/03/2017  . CKD (chronic kidney disease) stage 3, GFR 30-59 ml/min 04/03/2017  . Lumbar radiculopathy   . Advance directive discussed with patient 01/19/2017  . Mood disorder ([place]) 11/03/2013  . Psoriasis 02/21/2013  . Routine general medical examination at a health care facility 06/14/2012  . Morbid obesity ([place]) 12/02/2011  . VENTRICULAR HYPERTROPHY,  LEFT 10/19/2008  . Sleep disturbance 10/25/2007  . Venous (peripheral) insufficiency 08/11/2007  . Osteoarthritis, multiple sites 08/11/2007  . Impaired fasting glucose 08/11/2007  . Hx of adenomatous colonic polyps 05/20/2007  . B12 DEFICIENCY 09/30/2006  . Essential hypertension 09/30/2006  . Allergic rhinitis due to pollen 09/30/2006    Past Surgical History:  Procedure Laterality Date  . ABDOMINAL HYSTERECTOMY    . CATARACT EXTRACTION  10/2003   OD  . CHOLECYSTECTOMY    . COLONOSCOPY W/ POLYPECTOMY    . COLONOSCOPY WITH PROPOFOL N/A 10/15/2014    Procedure: COLONOSCOPY WITH PROPOFOL;  Surgeon: Ladene Artist, MD;  Location: [facility];  Service: Endoscopy;  Laterality: N/A;  . COLONOSCOPY WITH PROPOFOL N/A 03/23/2018   Procedure: COLONOSCOPY WITH PROPOFOL;  Surgeon: Gatha Mayer, MD;  Location: [facility] ENDOSCOPY;  Service: Endoscopy;  Laterality: N/A;  . ESOPHAGOGASTRODUODENOSCOPY (EGD) WITH PROPOFOL N/A 10/15/2014   Procedure: ESOPHAGOGASTRODUODENOSCOPY (EGD) WITH PROPOFOL;  Surgeon: Ladene Artist, MD;  Location: [facility];  Service: Endoscopy;  Laterality: N/A;  . EXTERNAL FIXATION ANKLE FRACTURE     Fx.  left ankle-fixation with pins later removed sec to infection 1985  . EXTERNAL FIXATION WRIST FRACTURE  1985   left with pins  . EYE SURGERY    . FEMUR FRACTURE SURGERY  06/2009  . FRACTURE SURGERY    . HARDWARE REMOVAL Left 10/05/2013   Procedure: HARDWARE REMOVAL LEFT DISTAL FEMUR;  Surgeon: Rozanna Box, MD;  Location: [place];  Service: Orthopedics;  Laterality: Left;  . HEMIARTHROPLASTY SHOULDER FRACTURE  06/2009  . HOT HEMOSTASIS N/A 03/23/2018   Procedure: HOT HEMOSTASIS (ARGON PLASMA COAGULATION/BICAP);  Surgeon: Gatha Mayer, MD;  Location: [facility] ENDOSCOPY;  Service: Endoscopy;  Laterality: N/A;  . JOINT REPLACEMENT    . STERIOD INJECTION Right 10/05/2013   Procedure: STEROID INJECTION;  Surgeon: Rozanna Box, MD;  Location: [place];  Service: Orthopedics;  Laterality: Right;  . TONSILLECTOMY    . TONSILLECTOMY    . TOTAL KNEE ARTHROPLASTY  03/09   left    OB History   No obstetric history on file.      Home Medications    Prior to Admission medications   Medication Sig Start Date End Date Taking? Authorizing Provider  amoxicillin-clavulanate (AUGMENTIN) 875-125 MG tablet Take 1 tablet by mouth every 12 (twelve) hours. 10/16/20  Yes Augusto Gamble B, NP  acetaminophen (TYLENOL) 500 MG tablet Take 1,000-1,500 mg by mouth every 6 (six) hours as needed for moderate pain or headache (pain).     [provider]  apixaban (ELIQUIS) 2.5 MG TABS tablet Take 1 tablet (2.5 mg total) by mouth 2 (two) times daily. 12/07/19   Venia Carbon, MD  gabapentin (NEURONTIN) 300 MG capsule Take 1 capsule in the morning and 2 capsules at bedtime 07/11/20   Viviana Simpler I, MD  hydrOXYzine (ATARAX/VISTARIL) 10 MG tablet TAKE 1 TABLET BY MOUTH THREE TIMES A DAY AS NEEDED 10/14/20   Venia Carbon, MD  KLOR-CON M20 20 MEQ tablet Take 20 mEq by mouth 2 (two) times daily. 02/22/20   [provider]  mometasone (ELOCON) 0.1 % ointment Apply to aa's rash QD-BID PRN. Avoid face, groin, and axilla. Patient taking differently: Apply 1 application topically 2 (two) times daily as needed (itching). Apply to aa's rash QD-BID PRN. Avoid face, groin, and axilla. 04/15/20   Ralene Bathe, MD  ondansetron (ZOFRAN) 4 MG tablet Take 1 tablet (4 mg total) by mouth  every 8 (eight) hours as needed for nausea or vomiting. 07/19/20   Venia Carbon, MD  oxyCODONE-acetaminophen (PERCOCET/ROXICET) 5-325 MG tablet Take 1 tablet by mouth every 4 (four) hours as needed for severe pain. 04/25/20   Venia Carbon, MD  predniSONE (DELTASONE) 20 MG tablet Take 2 tablets (40 mg total) by mouth daily. For 3 days and then 1 tab daily for 3 days. 09/19/20   Venia Carbon, MD  torsemide (DEMADEX) 20 MG tablet Take 1 tablet (20 mg total) by mouth daily. Patient taking differently: Take 20 mg by mouth daily as needed (fluid). 02/22/20   Venia Carbon, MD  traMADol (ULTRAM) 50 MG tablet Take 1 tablet (50 mg total) by mouth 3 (three) times daily as needed for moderate pain. 10/11/20   Venia Carbon, MD  triamterene-hydrochlorothiazide (MAXZIDE-25) 37.5-25 MG tablet TAKE 1 TABLET DAILY 04/24/20   Venia Carbon, MD    Family History Family History  Problem Relation Age of Onset  . Depression Mother   . Cancer Mother        uterine cancer  . Heart attack Father   . Cancer Brother        prostate cancer  .  Diabetes Maternal Aunt   . Arthritis Brother   . Asthma Brother   . Stroke Maternal Grandmother   . Pulmonary embolism Daughter     Social History Social History   Tobacco Use  . Smoking status: Former Smoker    Packs/day: 1.00    Years: 40.00    Pack years: 40.00    Types: Cigarettes    Quit date: 07/06/1993    Years since quitting: 27.2  . Smokeless tobacco: Never Used  Vaping Use  . Vaping Use: Never used  Substance Use Topics  . Alcohol use: No    Alcohol/week: 0.0 standard drinks  . Drug use: No     Allergies   Codeine sulfate, Celecoxib, Erythromycin base, and Spironolactone   Review of Systems Review of Systems   Physical Exam Triage Vital Signs ED Triage Vitals  Enc Vitals Group     BP 10/16/20 1527 127/64     Pulse Rate 10/16/20 1527 (!) 103     Resp 10/16/20 1527 (!) 21     Temp 10/16/20 1527 98.9 F (37.2 C)     Temp src --      SpO2 10/16/20 1527 92 %     Weight --      Height --      Head Circumference --      Peak Flow --      Pain Score 10/16/20 1524 0     Pain Loc --      Pain Edu? --      Excl. in [place]? --    No data found.  Updated Vital Signs BP 127/64   Pulse (!) 103   Temp 98.9 F (37.2 C)   Resp (!) 21   SpO2 92%   Visual Acuity Right Eye Distance:   Left Eye Distance:   Bilateral Distance:    Right Eye Near:   Left Eye Near:    Bilateral Near:     Physical Exam Constitutional:      General: Chelsea Keller is not in acute distress.    Appearance: Chelsea Keller is well-developed.  Cardiovascular:     Rate and Rhythm: Normal rate.     Comments: Scant lower extremity edema, patient states this is improved from baseline for her Pulmonary:  Effort: Pulmonary effort is normal.     Breath sounds: Examination of the right-lower field reveals decreased breath sounds. Examination of the left-lower field reveals decreased breath sounds. Decreased breath sounds present.  Skin:    General: Skin is warm and dry.  Neurological:     Mental  Status: Chelsea Keller is alert and oriented to person, place, and time.      UC Treatments / Results  Labs (all labs ordered are listed, but only abnormal results are displayed) Labs Reviewed  CBC WITH DIFFERENTIAL/PLATELET  COMPREHENSIVE METABOLIC PANEL  BRAIN NATRIURETIC PEPTIDE    EKG   Radiology DG Chest 2 View  Result Date: 10/16/2020 CLINICAL DATA:  Cough, fatigue, shortness of breath, previous tobacco abuse EXAM: CHEST - 2 VIEW COMPARISON:  07/18/2020 FINDINGS: Frontal and lateral views of the chest demonstrate an unremarkable cardiac silhouette. No airspace disease, effusion, or pneumothorax. No acute bony abnormalities. Chronic degenerative changes right shoulder. IMPRESSION: 1. No acute intrathoracic process. Electronically Signed   By: Randa Ngo M.D.   On: 10/16/2020 16:24    Procedures Procedures (including critical care time)  Medications Ordered in UC Medications - No data to display  Initial Impression / Assessment and Plan / UC Course  I have reviewed the triage vital signs and the nursing notes.  Pertinent labs & imaging results that were available during my care of the patient were reviewed by me and considered in my medical decision making (see chart for details).     Chest xray is overall unremarkable. No crackles on exam and no peripheral edema. Chelsea Keller is on eliquis and denies chest pain. Opted to provide empiric antibiotics pending baseline labs to include cbc, cmp and bnp. Noted oxygen low of normal which appears to be new for her. Encouraged close follow up with her PCP for recheck and ER precautions provided. Patient and family verbalized understanding and agreeable to plan.   Final Clinical Impressions(s) / UC Diagnoses   Final diagnoses:  Cough  Shortness of breath     Discharge Instructions     Please start  antibiotics.  I will call you if there are any concerning findings with your lab tests.  Please follow up with your PCP in the next few days  for a recheck.  If worsening of shortness of breath , fevers, or oxygen lower than 90% please go to the ER.     ED Prescriptions    Medication Sig Dispense Auth. Provider   amoxicillin-clavulanate (AUGMENTIN) 875-125 MG tablet Take 1 tablet by mouth every 12 (twelve) hours. 14 tablet Zigmund Gottron, NP     PDMP not reviewed this encounter.   Zigmund Gottron, NP 10/16/20 [phone]

## 2020-10-16 NOTE — Discharge Instructions (Signed)
Please start  antibiotics.  I will call you if there are any concerning findings with your lab tests.  Please follow up with your PCP in the next few days for a recheck.  If worsening of shortness of breath , fevers, or oxygen lower than 90% please go to the ER.

## 2020-10-16 NOTE — ED Triage Notes (Signed)
Pt in with c/o productive cough, sob, fatigue and fever 104.3 that has been going on for 3 weeks now  Pt has not had medication for sxs

## 2020-10-22 ENCOUNTER — Ambulatory Visit
Admission: RE | Admit: 2020-10-22 | Discharge: 2020-10-22 | Disposition: A | Discharge status: Home / Self Care | Payer: Medicare Other | Source: Ambulatory Visit | Attending: Orthopedic Surgery | Admitting: Orthopedic Surgery

## 2020-10-22 ENCOUNTER — Other Ambulatory Visit: Payer: Self-pay

## 2020-10-22 DIAGNOSIS — M1611 Unilateral primary osteoarthritis, right hip: Secondary | ICD-10-CM

## 2020-10-22 MED ORDER — METHYLPREDNISOLONE ACETATE 40 MG/ML INJ SUSP (RADIOLOG: 80.0000 mg | Freq: Once | INTRAMUSCULAR | Status: DC

## 2020-10-22 MED ORDER — IOPAMIDOL (ISOVUE-M 200) INJECTION 41%: 1.0000 mL | Freq: Once | INTRAMUSCULAR | Status: DC

## 2020-10-25 MED ORDER — CEPHALEXIN 500 MG PO CAPS: 500.0000 mg | ORAL_CAPSULE | Freq: Three times a day (TID) | ORAL | 0 refills | Status: AC

## 2020-10-25 MED ORDER — PREDNISONE 20 MG PO TABS: 40.0000 mg | ORAL_TABLET | Freq: Every day | ORAL | 0 refills | Status: DC

## 2020-11-04 IMAGING — XA DG FLUORO GUIDE SPINAL/SI JT INJ*R*
1 series · 1 of 1 positions shown · non-contrast
Comparison: none

CLINICAL DATA: Sacrococcygeal disorders.  Right-sided sacroiliitis.

[Series 1: ortho adipose · 1 of 1 slices shown]
[im 1/1]
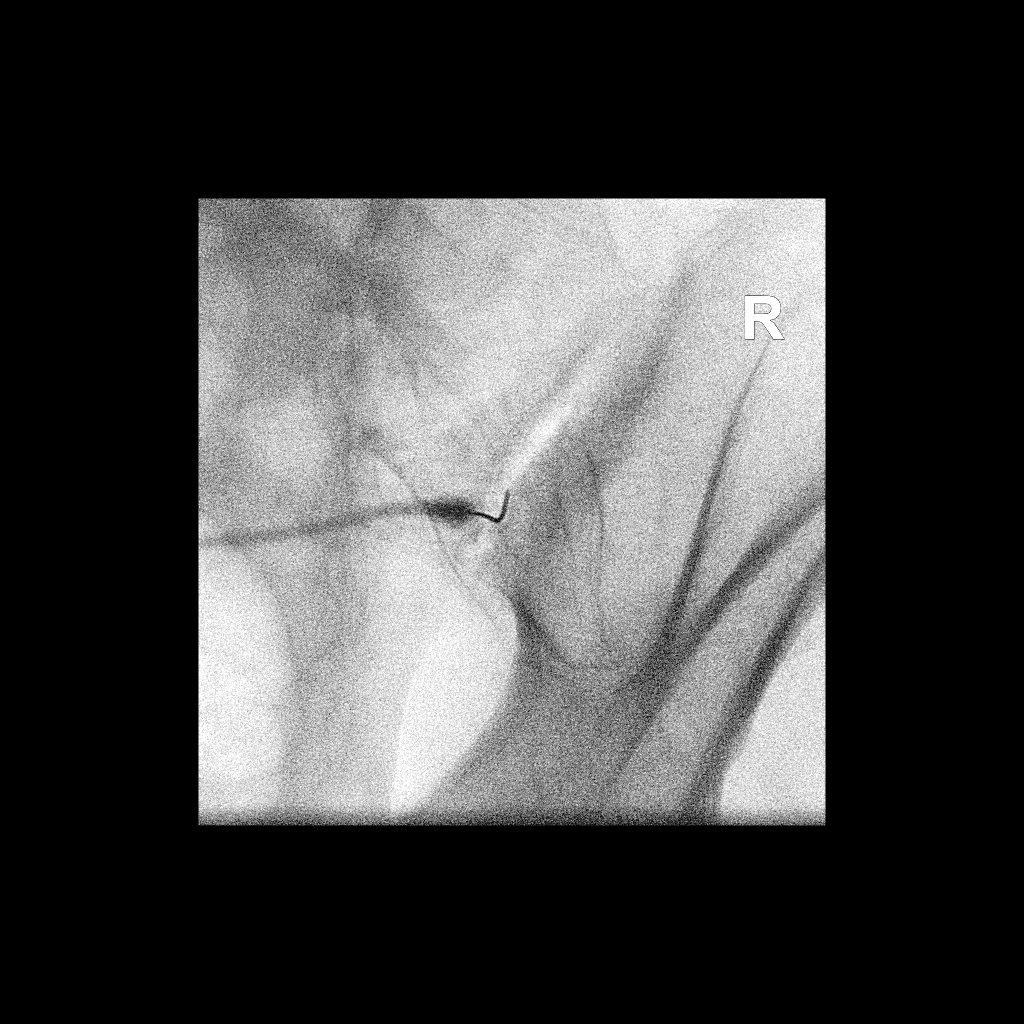

[1 of 1 positions shown; findings below may reference images not displayed]

FLUOROSCOPY TIME:  Radiation Exposure Index (as provided by the
fluoroscopic device): 44.35 uGy*m2

If the device does not provide the exposure index:

Fluoroscopy Time:  23 seconds

Number of Acquired Images:  0

PROCEDURE:
Right SI JOINT INJECTION.

After a thorough discussion of risks and benefits of the procedure,
including bleeding, infection, injury to nerves, blood vessels, and
adjacent structures, verbal and written consent was obtained.
Specific risks of the procedure included
nondiagnostic/nontherapeutic injection and non target injection. The
patient was placed prone on the fluoroscopy table and localization
was performed over the sacrum. Target site was marked using
fluoroscopic guidance. The skin was prepped and draped in the usual
sterile fashion using Betadine soap.

After local anesthesia with 1% lidocaine without epinephrine and
subsequent deep anesthesia, a standard 22 gauge spinal needle was
advanced into the right SI joint. Injection of 0.5 ml Isovue-M 200
confirmed intra-articular placement. No vascular uptake present.
Subsequently, 120 mg of Depo-Medrol and 1.5 mL 1% lidocaine was
injected into SI joint. Needles removed and a sterile dressing
applied.

No complications were observed. The patient was observed and
released under the care of a driver after 30 minutes.
IMPRESSION: Successful fluoroscopically guided right SI joint injection.

## 2020-11-05 ENCOUNTER — Other Ambulatory Visit: Payer: Self-pay

## 2020-11-05 ENCOUNTER — Ambulatory Visit (INDEPENDENT_AMBULATORY_CARE_PROVIDER_SITE_OTHER): Payer: Medicare Other | Admitting: Internal Medicine

## 2020-11-05 ENCOUNTER — Encounter: Payer: Self-pay | Admitting: Internal Medicine

## 2020-11-05 VITALS — BP 128/72 | HR 87 | Temp 98.0°F | Ht 62.0 in | Wt 257.1 lb

## 2020-11-05 DIAGNOSIS — M1000 Idiopathic gout, unspecified site: Secondary | ICD-10-CM | POA: Diagnosis not present

## 2020-11-05 MED ORDER — COLCHICINE 0.6 MG PO TABS: 0.6000 mg | ORAL_TABLET | Freq: Two times a day (BID) | ORAL | 11 refills | Status: DC

## 2020-11-05 MED ORDER — ALLOPURINOL 300 MG PO TABS: 300.0000 mg | ORAL_TABLET | Freq: Every day | ORAL | 6 refills | Status: DC

## 2020-11-05 MED ORDER — PREDNISONE 20 MG PO TABS: 40.0000 mg | ORAL_TABLET | Freq: Every day | ORAL | 1 refills | Status: DC

## 2020-11-05 NOTE — Assessment & Plan Note (Signed)
Clear exacerbation and probably the cause of many of her flares Tried colchicine x 1--but didn't feel anything  Her uric acid is very high Will try allopurinol 300mg  Prednisone for 10 days (with refill) Restart the colchicine at 2 per day for now--then down to 1 per day once symptoms better (and continue that for now)

## 2020-11-05 NOTE — Progress Notes (Signed)
Subjective:    Patient ID: Chelsea Keller, female    DOB: Nov 12, 1934, 86 y.o.   MRN: [id number]  HPI Here due to ongoing hand pain This visit occurred during the SARS-CoV-2 public health emergency.  Safety protocols were in place, including screening questions prior to the visit, additional usage of staff PPE, and extensive cleaning of exam room while observing appropriate contact time as indicated for disinfecting solutions.   Having excruciating pain in left hand Started 4 days Had been taking prednisone for swollen fingers---and that improved Now in left wrist and goes proximal and distal Has swelling and redness Starting in 3rd right finger  Current Outpatient Medications on File Prior to Visit  Medication Sig Dispense Refill  . acetaminophen (TYLENOL) 500 MG tablet Take 1,000-1,500 mg by mouth every 6 (six) hours as needed for moderate pain or headache (pain).     Marland Kitchen apixaban (ELIQUIS) 2.5 MG TABS tablet Take 1 tablet (2.5 mg total) by mouth 2 (two) times daily. 180 tablet 3  . gabapentin (NEURONTIN) 300 MG capsule Take 1 capsule in the morning and 2 capsules at bedtime 270 capsule 3  . hydrOXYzine (ATARAX/VISTARIL) 10 MG tablet TAKE 1 TABLET BY MOUTH THREE TIMES A DAY AS NEEDED 90 tablet 0  . KLOR-CON M20 20 MEQ tablet Take 20 mEq by mouth 2 (two) times daily.    . mometasone (ELOCON) 0.1 % ointment Apply to aa's rash QD-BID PRN. Avoid face, groin, and axilla. (Patient taking differently: Apply 1 application topically 2 (two) times daily as needed (itching). Apply to aa's rash QD-BID PRN. Avoid face, groin, and axilla.) 45 g 0  . ondansetron (ZOFRAN) 4 MG tablet Take 1 tablet (4 mg total) by mouth every 8 (eight) hours as needed for nausea or vomiting. 20 tablet 0  . oxyCODONE-acetaminophen (PERCOCET/ROXICET) 5-325 MG tablet Take 1 tablet by mouth every 4 (four) hours as needed for severe pain. 10 tablet 0  . traMADol (ULTRAM) 50 MG tablet Take 1 tablet (50 mg total) by mouth 3 (three)  times daily as needed for moderate pain. 60 tablet 0  . triamterene-hydrochlorothiazide (MAXZIDE-25) 37.5-25 MG tablet TAKE 1 TABLET DAILY 90 tablet 3  . torsemide (DEMADEX) 20 MG tablet Take 1 tablet (20 mg total) by mouth daily. (Patient not taking: Reported on 11/05/2020) 30 tablet 11   No current facility-administered medications on file prior to visit.    Allergies  Allergen Reactions  . Codeine Sulfate Shortness Of Breath  . Celecoxib Other (See Comments)    Caused vaginal bleeding  . Erythromycin Base Nausea And Vomiting  . Spironolactone Nausea Only and Other (See Comments)    Blurred vision, Upset stomach, lethargy    Past Medical History:  Diagnosis Date  . Allergy   . Anemia   . Anxiety   . Arthritis   . Arthritis of sacroiliac joint of both sides 11/12/2017  . Bilateral pulmonary embolism ([place]) 06/23/2017  . Chronic diastolic CHF (congestive heart failure) (HCC)    a. Echo 1/16:  mild LVH, EF normal, grade 1 DD, MAC  . Chronic venous insufficiency    chronic LE edema  . DDD (degenerative disc disease), lumbar 11/12/2017  . Degenerative joint disease (DJD) of hip, Bilateral  11/12/2017  . Depression   . Fibromyalgia    constant pain  . Hx of cardiac catheterization    a. LHC in [place] "ok" per patient with mild plaque in a single vessel - records not available  . Hx of cardiovascular  stress test    a. Nuclear study in 2008 normal  . Hx of colonic polyps   . Hypertension   . Hypertriglyceridemia   . Impaired fasting glucose   . PONV (postoperative nausea and vomiting)   . PUD (peptic ulcer disease)    hx of gastric ulcer  . Pulmonary emboli ([place]) 06/2017  . Vitamin B12 deficiency     Past Surgical History:  Procedure Laterality Date  . ABDOMINAL HYSTERECTOMY    . CATARACT EXTRACTION  10/2003   OD  . CHOLECYSTECTOMY    . COLONOSCOPY W/ POLYPECTOMY    . COLONOSCOPY WITH PROPOFOL N/A 10/15/2014   Procedure: COLONOSCOPY WITH PROPOFOL;  Surgeon: Ladene Artist,  MD;  [facility];  Service: Endoscopy;  Laterality: N/A;  . COLONOSCOPY WITH PROPOFOL N/A 03/23/2018   Procedure: COLONOSCOPY WITH PROPOFOL;  Surgeon: Gatha Mayer, MD;  [facility];  Service: Endoscopy;  Laterality: N/A;  . ESOPHAGOGASTRODUODENOSCOPY (EGD) WITH PROPOFOL N/A 10/15/2014   Procedure: ESOPHAGOGASTRODUODENOSCOPY (EGD) WITH PROPOFOL;  Surgeon: Ladene Artist, MD;  [facility];  Service: Endoscopy;  Laterality: N/A;  . EXTERNAL FIXATION ANKLE FRACTURE     Fx.  left ankle-fixation with pins later removed sec to infection 1985  . EXTERNAL FIXATION WRIST FRACTURE  1985   left with pins  . EYE SURGERY    . FEMUR FRACTURE SURGERY  06/2009  . FRACTURE SURGERY    . HARDWARE REMOVAL Left 10/05/2013   Procedure: HARDWARE REMOVAL LEFT DISTAL FEMUR;  Surgeon: Rozanna Box, MD;  Location: [place];  Service: Orthopedics;  Laterality: Left;  . HEMIARTHROPLASTY SHOULDER FRACTURE  06/2009  . HOT HEMOSTASIS N/A 03/23/2018   Procedure: HOT HEMOSTASIS (ARGON PLASMA COAGULATION/BICAP);  Surgeon: Gatha Mayer, MD;  Location: [facility] ENDOSCOPY;  Service: Endoscopy;  Laterality: N/A;  . JOINT REPLACEMENT    . STERIOD INJECTION Right 10/05/2013   Procedure: STEROID INJECTION;  Surgeon: Rozanna Box, MD;  Location: [place];  Service: Orthopedics;  Laterality: Right;  . TONSILLECTOMY    . TONSILLECTOMY    . TOTAL KNEE ARTHROPLASTY  03/09   left    Family History  Problem Relation Age of Onset  . Depression Mother   . Cancer Mother        uterine cancer  . Heart attack Father   . Cancer Brother        prostate cancer  . Diabetes Maternal Aunt   . Arthritis Brother   . Asthma Brother   . Stroke Maternal Grandmother   . Pulmonary embolism Daughter     Social History   Socioeconomic History  . Marital status: Widowed    Spouse name: Not on file  . Number of children: 4  . Years of education: Not on file  . Highest education level: Not on file  Occupational  History  . Occupation: retired crossing guard  . 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 Smoking status: Former Smoker    Packs/day: 1.00    Years: 40.00    Pack years: 40.00    Types: Cigarettes    Quit date: 07/06/1993    Years since quitting: 27.3  . Smokeless tobacco: Never Used  Vaping Use  . Vaping Use: Never used  Substance and Sexual Activity  . Alcohol use: No    Alcohol/week: 0.0 standard drinks  . Drug use: No  . Sexual activity: Not Currently  Other Topics Concern  . 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 [employer], lives w/ daughter and adult grandsons   Former smoker, no EtOH      No living will   Plans to do health care POA forms---wants daughter [name]   Would accept resuscitation attempts---no prolonged ventilation   Absolutely no feeding tube   Social Determinants of Health   Financial Resource Strain: Not on file  Food Insecurity: Not on file  Transportation Needs: Not on file  Physical Activity: Not on file  Stress: Not on file  Social Connections: Not on file  Intimate Partner Violence: Not on file   Review of Systems  No fever     Objective:   Physical Exam Musculoskeletal:     Comments: Marked redness---tremendous tenderness, and warmth in left wrist            Assessment & Plan:

## 2020-11-05 NOTE — Patient Instructions (Signed)
Restart the prednisone like we have used in the past. Start the allopurinol at one per day and continue. Restart colchicine twice a day---and continue this till you have no joint pain. Then you can decrease to 1 daily and continue unless I tell you it is okay to stop.

## 2020-11-12 ENCOUNTER — Encounter: Payer: Medicare Other | Admitting: Internal Medicine

## 2020-11-18 ENCOUNTER — Other Ambulatory Visit: Payer: Self-pay

## 2020-11-18 ENCOUNTER — Ambulatory Visit (INDEPENDENT_AMBULATORY_CARE_PROVIDER_SITE_OTHER): Payer: Medicare Other | Admitting: Dermatology

## 2020-11-18 DIAGNOSIS — D692 Other nonthrombocytopenic purpura: Secondary | ICD-10-CM | POA: Diagnosis not present

## 2020-11-18 DIAGNOSIS — L209 Atopic dermatitis, unspecified: Secondary | ICD-10-CM | POA: Diagnosis not present

## 2020-11-18 DIAGNOSIS — L308 Other specified dermatitis: Secondary | ICD-10-CM

## 2020-11-18 MED ORDER — EUCRISA 2 % EX OINT: TOPICAL_OINTMENT | CUTANEOUS | 4 refills | Status: DC

## 2020-11-18 NOTE — Patient Instructions (Signed)

## 2020-11-18 NOTE — Progress Notes (Signed)
   Follow-Up Visit   Subjective  Chelsea Keller is a 86 y.o. female who presents for the following: 3 month follow up (Patient is here today for 3 month follow up on eczema at abdomen. She reports she is clear and has stopped using [place]. She reports no other concerns today at follow up. ). Also has bruising on her arms she would like checked.  The following portions of the chart were reviewed this encounter and updated as appropriate:  Tobacco  Allergies  Meds  Problems  Med Hx  Surg Hx  Fam Hx      Objective  Well appearing patient in no apparent distress; mood and affect are within normal limits.  A focused examination was performed including left upper abdomen , right and left chest . Relevant physical exam findings are noted in the Assessment and Plan.  Objective  left upper abdomen: Clear   Assessment & Plan  Eczema Atopic dermatitis left upper abdomen Improved and currently doing well   Atopic dermatitis (eczema) is a chronic, relapsing, pruritic condition that can significantly affect quality of life. It is often associated with allergic rhinitis and/or asthma and can require treatment with topical medications, phototherapy, or in severe cases a biologic medication called Dupixent in older children and adults.    Discussed restarting Eucrisa when needed for flare  Discussed colder weather could trigger flare.  Recommend follow up in February   Refill for Eucrisa 2 % ointment sent to patient's pharmacy  Reordered Medications Crisaborole (EUCRISA) 2 % OINT   Purpura - Chronic; persistent and recurrent.  Treatable, but not curable. - Violaceous macules and patches - Benign - Related to trauma, age, sun damage and/or use of blood thinners, chronic use of topical and/or oral steroids - Observe - Can use OTC arnica containing moisturizer such as Dermend Bruise Formula if desired - Call for worsening or other concerns  Return in about 9 months (around 08/21/2021)  for follow up on eczema .   IRuthell Rummage, CMA, am acting as scribe for Sarina Ser, MD.  Documentation: I have reviewed the above documentation for accuracy and completeness, and I agree with the above.  Sarina Ser, MD

## 2020-11-19 ENCOUNTER — Telehealth: Payer: Self-pay | Admitting: Cardiovascular Disease

## 2020-11-19 ENCOUNTER — Encounter: Payer: Self-pay | Admitting: Hematology and Oncology

## 2020-11-19 NOTE — Telephone Encounter (Signed)
I called the pt.  she states pain is like a gas pain.  Noticing often after eating over last couple week.  Lasts about 15-20 min and then goes away.  Painful.    Has not tried anything to make it better.  States lately almost every new med she takes she has a reaction to.  Happens after almost every meal every day.  No bloating. No lower extemity swelling currently.  No bowel or bladder changes.  No belching.  Able to do normal day to day activities but is very inactive in general.  Uses a walker to get around.  Complains of being very tired.   Out of breath after walking back from the bathroom.  She does not have known CAD  She takes Eliquis 2.5 mg BID for PE. Last CBC was April 13 hgb 10.9.  This was post transfusion.  She goes to hem onc for this.  She will call them now to see if they can get her in for cbc today.  She is aware I will forward this to Dr. Angelena Form for other  recommendations

## 2020-11-19 NOTE — Telephone Encounter (Signed)
Pt called back and states that she will be seen on 11/26/20 by Dr. Lindi Adie.   She states she knows this is not her heart but I adv that if symptoms change or she has further concerns to call us back.  I will let her know if Dr. Angelena Form has other recommendations.  I asked if she's ever taken pepcid or omeprazole.  She has tried it, but at this point refuses to add any new medications because she has a reaction to all of them.

## 2020-11-19 NOTE — Telephone Encounter (Signed)
Pt c/o of Chest Pain: STAT if CP now or developed within 24 hours  1. Are you having CP right now? As far as the pt's daughter know, her mom is still having CP but she is not sure  2. Are you experiencing any other symptoms (ex. SOB, nausea, vomiting, sweating)? Back Pain  3. How long have you been experiencing CP? 1 week  4. Is your CP continuous or coming and going? Daughter who is at work thinks the CP is continuous but she is not sure  5. Have you taken Nitroglycerin? No pt does not have any Nitro  Pt has an upcoming appt with Dr. Angelena Form on 12/30/20 ?

## 2020-11-19 NOTE — Telephone Encounter (Signed)
No other recommendations. Thanks, chris

## 2020-11-21 ENCOUNTER — Other Ambulatory Visit: Payer: Self-pay | Admitting: Internal Medicine

## 2020-11-25 NOTE — Progress Notes (Signed)
Patient Care Team: Venia Carbon, MD as PCP - General Angelena Form Annita Brod, MD as PCP - Cardiology (Cardiology) Sharmon Revere as Physician Assistant (Physician Assistant)  DIAGNOSIS:    ICD-10-CM   1. Iron deficiency anemia due to chronic blood loss  D50.0     CHIEF COMPLIANT: Follow-up of iron deficiency anemia and bilateral PEs  INTERVAL HISTORY: Chelsea Keller is a 86 y.o. with above-mentioned history of iron deficiency anemiapreviously treated with IV ironand who currently receives transfusions about every two months (last 10/11/20). She also has a history ofbilateral PEs currently on Eliquis.Shepresentsto the clinictoday for follow-up.  Couple of months ago she had intense abdominal pain which finally got better and she is doing very well.  Energy levels are low but she is not feeling to the point that she cannot function.  She is using a wheelchair for ambulation.  ALLERGIES:  is allergic to codeine sulfate, celecoxib, erythromycin base, and spironolactone.  MEDICATIONS:  Current Outpatient Medications  Medication Sig Dispense Refill  . acetaminophen (TYLENOL) 500 MG tablet Take 1,000-1,500 mg by mouth every 6 (six) hours as needed for moderate pain or headache (pain).     Marland Kitchen allopurinol (ZYLOPRIM) 300 MG tablet Take 1 tablet (300 mg total) by mouth daily. 30 tablet 6  . apixaban (ELIQUIS) 2.5 MG TABS tablet Take 1 tablet (2.5 mg total) by mouth 2 (two) times daily. 180 tablet 3  . colchicine 0.6 MG tablet Take 1 tablet (0.6 mg total) by mouth 2 (two) times daily. 60 tablet 11  . Crisaborole (EUCRISA) 2 % OINT Apply to skin daily 60 g 4  . gabapentin (NEURONTIN) 300 MG capsule Take 1 capsule in the morning and 2 capsules at bedtime 270 capsule 3  . hydrOXYzine (ATARAX/VISTARIL) 10 MG tablet TAKE 1 TABLET BY MOUTH THREE TIMES A DAY AS NEEDED 90 tablet 0  . KLOR-CON M20 20 MEQ tablet Take 20 mEq by mouth 2 (two) times daily.    . mometasone (ELOCON) 0.1 % ointment  Apply to aa's rash QD-BID PRN. Avoid face, groin, and axilla. (Patient taking differently: Apply 1 application topically 2 (two) times daily as needed (itching). Apply to aa's rash QD-BID PRN. Avoid face, groin, and axilla.) 45 g 0  . ondansetron (ZOFRAN) 4 MG tablet Take 1 tablet (4 mg total) by mouth every 8 (eight) hours as needed for nausea or vomiting. 20 tablet 0  . oxyCODONE-acetaminophen (PERCOCET/ROXICET) 5-325 MG tablet Take 1 tablet by mouth every 4 (four) hours as needed for severe pain. 10 tablet 0  . predniSONE (DELTASONE) 20 MG tablet Take 2 tablets (40 mg total) by mouth daily. For 5 days, then 1 tab daily for 5 days. 15 tablet 1  . torsemide (DEMADEX) 20 MG tablet Take 1 tablet (20 mg total) by mouth daily. (Patient not taking: No sig reported) 30 tablet 11  . traMADol (ULTRAM) 50 MG tablet Take 1 tablet (50 mg total) by mouth 3 (three) times daily as needed for moderate pain. 60 tablet 0  . triamterene-hydrochlorothiazide (MAXZIDE-25) 37.5-25 MG tablet TAKE 1 TABLET DAILY 90 tablet 3   No current facility-administered medications for this visit.    PHYSICAL EXAMINATION: ECOG PERFORMANCE STATUS: 1 - Symptomatic but completely ambulatory  Vitals:   11/26/20 1110  BP: (!) 153/54  Pulse: 91  Resp: 16  Temp: (!) 97.3 F (36.3 C)  SpO2: 100%   Filed Weights   11/26/20 1110  Weight: 257 lb 12.8 oz (116.9 kg)  LABORATORY DATA:  I have reviewed the data as listed CMP Latest Ref Rng & Units 10/16/2020 10/11/2020 07/18/2020  Glucose 70 - 99 mg/dL 102(H) 115(H) 126(H)  BUN 8 - 23 mg/dL 17 23 20   Creatinine 0.44 - 1.00 mg/dL 1.31(H) 0.91 1.26(H)  Sodium 135 - 145 mmol/L 135 144 137  Potassium 3.5 - 5.1 mmol/L 4.0 3.7 4.4  Chloride 98 - 111 mmol/L 103 108 102  CO2 22 - 32 mmol/L 22 23 22   Calcium 8.9 - 10.3 mg/dL 8.8(L) 8.6(L) 8.0(L)  Total Protein 6.5 - 8.1 g/dL 5.8(L) 5.9(L) 6.1(L)  Total Bilirubin 0.3 - 1.2 mg/dL 0.9 0.6 0.9  Alkaline Phos 38 - 126 U/L 81 97 82  AST 15  - 41 U/L 36 22 44(H)  ALT 0 - 44 U/L 19 15 16     Lab Results  Component Value Date   WBC 6.3 11/26/2020   HGB 9.3 (L) 11/26/2020   HCT 32.3 (L) 11/26/2020   MCV 81.2 11/26/2020   PLT 283 11/26/2020   NEUTROABS 3.9 11/26/2020    ASSESSMENT & PLAN:  Iron deficiency anemia due to chronic blood loss Hospitalization 08/11/2018: Severe GI bleed hemoglobin 4.9(internal hemorrhoids and angiodysplasia of the colon)Received blood transfusions IV iron treatment: September 2019, March 2020, Injectafer June 2020, Venofer January 2021  Toxicities of iron infusion:Patient gets profound diarrhea which last for 2 to 3 days afterFeraheme (and prescription for Lomotil for 3 days).With Venofer she did okay but she still had profound diarrhea as well as lethargic  10/10/2019: Hemoglobin 6.8: MCV 82, RDW 18:2 units of PRBC given on 10/11/2019. 11/03/2019: Hemoglobin 8.1 (iron studies and erythropoietin levels are pending) 11/20/2019: Hemoglobin 7.1, MCV 81.2 Iron studiesferritin 7, iron saturation 6%, absolute reticulocyte count 88.3 05/24/2020: Hemoglobin 10.4 06/24/2020: Hemoglobin 9.5 08/28/2020: Hemoglobin 8.9 10/11/2020: Hemoglobin 8, ferritin 7 10/16/2020: Hemoglobin 10.9 (this is after blood transfusions) 11/26/2020: Hemoglobin 9.3  Blood transfusions: 11/29/2019, 01/02/2020, October 2021, April 2022 I discussed with her that her anemia is also related to chronic kidney disease and we can consider erythropoietin stimulating agents like Aranesp or Retacrit if necessary.  She is very anxious about starting any new medication because of her risk of reactions and side effects.  She will think about it and let me know next month.  Because of her severe intolerance with the IV iron we are unable to do so.  She is also intolerant to oral iron therapy.  Current treatment:Blood transfusions as needed  Return to clinic in78month with labs and blood transfusion if needed.    No orders of the defined  types were placed in this encounter.  The patient has a good understanding of the overall plan. she agrees with it. she will call with any problems that may develop before the next visit here.  Total time spent: 20 mins including face to face time and time spent for planning, charting and coordination of care  Rulon Eisenmenger, MD, MPH 11/26/2020  I, Cloyde Reams Dorshimer, am acting as scribe for Dr. Nicholas Lose.  I have reviewed the above documentation for accuracy and completeness, and I agree with the above.

## 2020-11-26 ENCOUNTER — Other Ambulatory Visit: Payer: Self-pay

## 2020-11-26 ENCOUNTER — Inpatient Hospital Stay: Payer: Medicare Other | Attending: Hematology and Oncology

## 2020-11-26 ENCOUNTER — Inpatient Hospital Stay: Payer: Medicare Other

## 2020-11-26 ENCOUNTER — Encounter: Payer: Self-pay | Admitting: Dermatology

## 2020-11-26 ENCOUNTER — Inpatient Hospital Stay (HOSPITAL_BASED_OUTPATIENT_CLINIC_OR_DEPARTMENT_OTHER): Payer: Medicare Other | Admitting: Hematology and Oncology

## 2020-11-26 DIAGNOSIS — R197 Diarrhea, unspecified: Secondary | ICD-10-CM | POA: Diagnosis not present

## 2020-11-26 DIAGNOSIS — D500 Iron deficiency anemia secondary to blood loss (chronic): Secondary | ICD-10-CM | POA: Diagnosis present

## 2020-11-26 DIAGNOSIS — D649 Anemia, unspecified: Secondary | ICD-10-CM

## 2020-11-26 DIAGNOSIS — Z7901 Long term (current) use of anticoagulants: Secondary | ICD-10-CM | POA: Insufficient documentation

## 2020-11-26 DIAGNOSIS — N189 Chronic kidney disease, unspecified: Secondary | ICD-10-CM | POA: Insufficient documentation

## 2020-11-26 DIAGNOSIS — Z881 Allergy status to other antibiotic agents status: Secondary | ICD-10-CM | POA: Insufficient documentation

## 2020-11-26 DIAGNOSIS — K648 Other hemorrhoids: Secondary | ICD-10-CM | POA: Insufficient documentation

## 2020-11-26 DIAGNOSIS — Z885 Allergy status to narcotic agent status: Secondary | ICD-10-CM | POA: Diagnosis not present

## 2020-11-26 DIAGNOSIS — R5383 Other fatigue: Secondary | ICD-10-CM | POA: Insufficient documentation

## 2020-11-26 DIAGNOSIS — Z79899 Other long term (current) drug therapy: Secondary | ICD-10-CM | POA: Diagnosis not present

## 2020-11-26 DIAGNOSIS — K922 Gastrointestinal hemorrhage, unspecified: Secondary | ICD-10-CM | POA: Insufficient documentation

## 2020-11-26 LAB — CBC WITH DIFFERENTIAL (CANCER CENTER ONLY)
Abs Immature Granulocytes: 0.03 10*3/uL (ref 0.00–0.07)
Basophils Absolute: 0.1 10*3/uL (ref 0.0–0.1)
Basophils Relative: 2 %
Eosinophils Absolute: 0.4 10*3/uL (ref 0.0–0.5)
Eosinophils Relative: 7 %
HCT: 32.3 % — ABNORMAL LOW (ref 36.0–46.0)
Hemoglobin: 9.3 g/dL — ABNORMAL LOW (ref 12.0–15.0)
Immature Granulocytes: 1 %
Lymphocytes Relative: 19 %
Lymphs Abs: 1.2 10*3/uL (ref 0.7–4.0)
MCH: 23.4 pg — ABNORMAL LOW (ref 26.0–34.0)
MCHC: 28.8 g/dL — ABNORMAL LOW (ref 30.0–36.0)
MCV: 81.2 fL (ref 80.0–100.0)
Monocytes Absolute: 0.7 10*3/uL (ref 0.1–1.0)
Monocytes Relative: 11 %
Neutro Abs: 3.9 10*3/uL (ref 1.7–7.7)
Neutrophils Relative %: 60 %
Platelet Count: 283 10*3/uL (ref 150–400)
RBC: 3.98 MIL/uL (ref 3.87–5.11)
RDW: 20.3 % — ABNORMAL HIGH (ref 11.5–15.5)
WBC Count: 6.3 10*3/uL (ref 4.0–10.5)
nRBC: 0 % (ref 0.0–0.2)

## 2020-11-26 NOTE — Assessment & Plan Note (Signed)
Hospitalization 08/11/2018: Severe GI bleed hemoglobin 4.9(internal hemorrhoids and angiodysplasia of the colon)Received blood transfusions IV iron treatment: September 2019, March 2020, Injectafer June 2020, Venofer January 2021  Toxicities of iron infusion:Patient gets profound diarrhea which last for 2 to 3 days afterFeraheme (and prescription for Lomotil for 3 days).With Venofer she did okay but she still had profound diarrhea as well as lethargic  10/10/2019: Hemoglobin 6.8: MCV 82, RDW 18:2 units of PRBC given on 10/11/2019. 11/03/2019: Hemoglobin 8.1 (iron studies and erythropoietin levels are pending) 11/20/2019: Hemoglobin 7.1, MCV 81.2 Iron studiesferritin 7, iron saturation 6%, absolute reticulocyte count 88.3 05/24/2020: Hemoglobin 10.4 06/24/2020: Hemoglobin 9.5 08/28/2020: Hemoglobin 8.9 10/11/2020: Hemoglobin 8, ferritin 7 10/16/2020: Hemoglobin 10.9 (this is after blood transfusions) 11/26/2020:  Blood transfusions: 11/29/2019, 01/02/2020, October 2021, April 2022  Because of her severe intolerance with the IV iron we are unable to do so. She will continue with oral iron.  Current treatment:Blood transfusions as needed  In spite of low ferritin we cannot give intravenous iron therapy because she is intolerant to IV iron.  Return to clinic in32months with labs and blood transfusion if needed.

## 2020-12-19 ENCOUNTER — Encounter: Payer: Self-pay | Admitting: Internal Medicine

## 2020-12-19 ENCOUNTER — Other Ambulatory Visit: Payer: Self-pay

## 2020-12-19 ENCOUNTER — Ambulatory Visit (INDEPENDENT_AMBULATORY_CARE_PROVIDER_SITE_OTHER): Payer: Medicare Other | Admitting: Internal Medicine

## 2020-12-19 VITALS — BP 118/62 | HR 72 | Temp 97.5°F | Ht 62.0 in | Wt 270.0 lb

## 2020-12-19 DIAGNOSIS — M1000 Idiopathic gout, unspecified site: Secondary | ICD-10-CM

## 2020-12-19 DIAGNOSIS — L03313 Cellulitis of chest wall: Secondary | ICD-10-CM | POA: Diagnosis not present

## 2020-12-19 MED ORDER — CEPHALEXIN 500 MG PO CAPS: 500.0000 mg | ORAL_CAPSULE | Freq: Three times a day (TID) | ORAL | 0 refills | Status: DC

## 2020-12-19 NOTE — Patient Instructions (Signed)
Please restart the allopurinol 300mg  daily. If your hand pain worsens, try the colchicine up to 2 a day till it calms down. If that doesn't work, I can send a prescription for the prednisone again.

## 2020-12-19 NOTE — Progress Notes (Signed)
Subjective:    Patient ID: Chelsea Keller, female    DOB: March 29, 1935, 86 y.o.   MRN: [id number]  HPI Here with daughter for follow up of gout This visit occurred during the SARS-CoV-2 public health emergency.  Safety protocols were in place, including screening questions prior to the visit, additional usage of staff PPE, and extensive cleaning of exam room while observing appropriate contact time as indicated for disinfecting solutions.   Didn't tolerate the allopurinol Had to run to the bathroom with diarrhea after taking it--stopped it after 10 days (But was taking colchicine at the same time) Still has pain and swelling in fingers Some numbness also Stopped colchicine  Also with mass around right collarbone Itchy--gets warm and itchy No known bite No fever  Current Outpatient Medications on File Prior to Visit  Medication Sig Dispense Refill   acetaminophen (TYLENOL) 500 MG tablet Take 1,000-1,500 mg by mouth every 6 (six) hours as needed for moderate pain or headache (pain).      apixaban (ELIQUIS) 2.5 MG TABS tablet Take 1 tablet (2.5 mg total) by mouth 2 (two) times daily. 180 tablet 3   Crisaborole (EUCRISA) 2 % OINT Apply to skin daily 60 g 4   gabapentin (NEURONTIN) 300 MG capsule Take 1 capsule in the morning and 2 capsules at bedtime 270 capsule 3   hydrOXYzine (ATARAX/VISTARIL) 10 MG tablet TAKE 1 TABLET BY MOUTH THREE TIMES A DAY AS NEEDED 90 tablet 0   KLOR-CON M20 20 MEQ tablet Take 20 mEq by mouth 2 (two) times daily.     ondansetron (ZOFRAN) 4 MG tablet Take 1 tablet (4 mg total) by mouth every 8 (eight) hours as needed for nausea or vomiting. 20 tablet 0   traMADol (ULTRAM) 50 MG tablet Take 1 tablet (50 mg total) by mouth 3 (three) times daily as needed for moderate pain. 60 tablet 0   triamterene-hydrochlorothiazide (MAXZIDE-25) 37.5-25 MG tablet TAKE 1 TABLET DAILY 90 tablet 3   allopurinol (ZYLOPRIM) 300 MG tablet Take 1 tablet (300 mg total) by mouth daily.  (Patient not taking: Reported on 12/19/2020) 30 tablet 6   colchicine 0.6 MG tablet Take 1 tablet (0.6 mg total) by mouth 2 (two) times daily. (Patient not taking: Reported on 12/19/2020) 60 tablet 11   oxyCODONE-acetaminophen (PERCOCET/ROXICET) 5-325 MG tablet Take 1 tablet by mouth every 4 (four) hours as needed for severe pain. (Patient not taking: Reported on 12/19/2020) 10 tablet 0   predniSONE (DELTASONE) 20 MG tablet Take 2 tablets (40 mg total) by mouth daily. For 5 days, then 1 tab daily for 5 days. (Patient not taking: Reported on 12/19/2020) 15 tablet 1   torsemide (DEMADEX) 20 MG tablet Take 1 tablet (20 mg total) by mouth daily. (Patient not taking: Reported on 12/19/2020) 30 tablet 11   No current facility-administered medications on file prior to visit.    Allergies  Allergen Reactions   Codeine Sulfate Shortness Of Breath   Celecoxib Other (See Comments)    Caused vaginal bleeding   Erythromycin Base Nausea And Vomiting   Spironolactone Nausea Only and Other (See Comments)    Blurred vision, Upset stomach, lethargy    Past Medical History:  Diagnosis Date   Allergy    Anemia    Anxiety    Arthritis    Arthritis of sacroiliac joint of both sides 11/12/2017   Bilateral pulmonary embolism (HCC) 06/23/2017   Chronic diastolic CHF (congestive heart failure) ([place])    a. Echo 1/16:  mild LVH, EF normal, grade 1 DD, MAC   Chronic venous insufficiency    chronic LE edema   DDD (degenerative disc disease), lumbar 11/12/2017   Degenerative joint disease (DJD) of hip, Bilateral  11/12/2017   Depression    Fibromyalgia    constant pain   Hx of cardiac catheterization    a. LHC in [place] "ok" per patient with mild plaque in a single vessel - records not available   Hx of cardiovascular stress test    a. Nuclear study in 2008 normal   Hx of colonic polyps    Hypertension    Hypertriglyceridemia    Impaired fasting glucose    PONV (postoperative nausea and vomiting)    PUD (peptic  ulcer disease)    hx of gastric ulcer   Pulmonary emboli ([place]) 06/2017   Vitamin B12 deficiency     Past Surgical History:  Procedure Laterality Date   ABDOMINAL HYSTERECTOMY     CATARACT EXTRACTION  10/2003   OD   CHOLECYSTECTOMY     COLONOSCOPY W/ POLYPECTOMY     COLONOSCOPY WITH PROPOFOL N/A 10/15/2014   Procedure: COLONOSCOPY WITH PROPOFOL;  Surgeon: Ladene Artist, MD;  Location: [facility] ENDOSCOPY;  Service: Endoscopy;  Laterality: N/A;   COLONOSCOPY WITH PROPOFOL N/A 03/23/2018   Procedure: COLONOSCOPY WITH PROPOFOL;  Surgeon: Gatha Mayer, MD;  Location[facility] ENDOSCOPY;  Service: Endoscopy;  Laterality: N/A;   ESOPHAGOGASTRODUODENOSCOPY (EGD) WITH PROPOFOL N/A 10/15/2014   Procedure: ESOPHAGOGASTRODUODENOSCOPY (EGD) WITH PROPOFOL;  Surgeon: Ladene Artist, MD;  Location: [facility] ENDOSCOPY;  Service: Endoscopy;  Laterality: N/A;   EXTERNAL FIXATION ANKLE FRACTURE     Fx.  left ankle-fixation with pins later removed sec to infection [place]   left with pins   EYE SURGERY     FEMUR FRACTURE SURGERY  06/2009   FRACTURE SURGERY     HARDWARE REMOVAL Left 10/05/2013   Procedure: HARDWARE REMOVAL LEFT DISTAL FEMUR;  Surgeon: Rozanna Box, MD;  Location: [place];  Service: Orthopedics;  Laterality: Left;   HEMIARTHROPLASTY SHOULDER FRACTURE  06/2009   HOT HEMOSTASIS N/A 03/23/2018   Procedure: HOT HEMOSTASIS (ARGON PLASMA COAGULATION/BICAP);  Surgeon: Gatha Mayer, MD;  Location: [facility] ENDOSCOPY;  Service: Endoscopy;  Laterality: N/A;   JOINT REPLACEMENT     STERIOD INJECTION Right 10/05/2013   Procedure: STEROID INJECTION;  Surgeon: Rozanna Box, MD;  Location: [place];  Service: Orthopedics;  Laterality: Right;   TONSILLECTOMY     TONSILLECTOMY     TOTAL KNEE ARTHROPLASTY  03/09   left    Family History  Problem Relation Age of Onset   Depression Mother    Cancer Mother        uterine cancer   Heart attack Father    Cancer Brother         prostate cancer   Diabetes Maternal Aunt    Arthritis Brother    Asthma Brother    Stroke Maternal Grandmother    Pulmonary embolism Daughter     Social History   Socioeconomic History   Marital status: Widowed    Spouse name: Not on file   Number of children: 4   Years of education: Not on file   Highest education level: Not on file  Occupational History   Occupation: retired crossing guard   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  Tobacco Use   Smoking status: Former    Packs/day: 1.00  Years: 40.00    Pack years: 40.00    Types: Cigarettes    Quit date: 07/06/1993    Years since quitting: 27.4   Smokeless tobacco: Never  Vaping Use   Vaping Use: Never used  Substance and Sexual Activity   Alcohol use: No    Alcohol/week: 0.0 standard drinks   Drug use: No   Sexual activity: Not Currently  Other Topics Concern   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 and adult grandsons   Former smoker, no EtOH      No living will   Plans to do health care POA forms---wants daughter [name]   Would accept resuscitation attempts---no prolonged ventilation   Absolutely no feeding tube   Social Determinants of Health   Financial Resource Strain: Not on file  Food Insecurity: Not on file  Transportation Needs: Not on file  Physical Activity: Not on file  Stress: Not on file  Social Connections: Not on file  Intimate Partner Violence: Not on file   Review of Systems Had to stop eating due to diarrhea--now back to eating okay      Objective:   Physical Exam Constitutional:      Appearance: Normal appearance.  Musculoskeletal:     Comments: No major synovitis in hands now Slight tenderness right thumb  Skin:    Comments: Swollen mass under medial right clavicle (near sternum)----warm, tender and red No mass in supraclavicular area Doesn't feel like a node  Neurological:      Mental Status: She is alert.           Assessment & Plan:

## 2020-12-19 NOTE — Assessment & Plan Note (Signed)
I suspect the diarrhea was from the colchicine--not the allopurinol Her hands are better now Will have her restart the allopurinol---use colchicine as needed if flares (and I can give prednisone prn)

## 2020-12-19 NOTE — Assessment & Plan Note (Signed)
Has swollen area under medial right clavicle--not clearly a node Will try 7 days of cefalexin

## 2020-12-25 NOTE — Assessment & Plan Note (Signed)
Hospitalization 08/11/2018: Severe GI bleed hemoglobin 4.9(internal hemorrhoids and angiodysplasia of the colon)Received blood transfusions IV iron treatment: September 2019, March 2020, Injectafer June 2020, Venofer January 2021  Toxicities of iron infusion:Patient gets profound diarrhea which last for 2 to 3 days afterFeraheme (and prescription for Lomotil for 3 days).With Venofer she did okay but she still had profound diarrhea as well as lethargic  10/10/2019: Hemoglobin 6.8: MCV 82, RDW 18:2 units of PRBC given on 10/11/2019. 11/03/2019: Hemoglobin 8.1 (iron studies and erythropoietin levels are pending) 11/20/2019: Hemoglobin 7.1, MCV 81.2 Iron studiesferritin 7, iron saturation 6%, absolute reticulocyte count 88.3 05/24/2020: Hemoglobin 10.4 06/24/2020: Hemoglobin 9.5 08/28/2020: Hemoglobin 8.9 10/11/2020: Hemoglobin 8, ferritin 7 10/16/2020: Hemoglobin 10.9 (this is after blood transfusions) 11/26/2020: Hemoglobin 9.3  Blood transfusions: 11/29/2019, 01/02/2020, October 2021, April 2022 I discussed with her that her anemia is also related to chronic kidney disease and we can consider erythropoietin stimulating agents like Aranesp or Retacrit if necessary.  She is very anxious about starting any new medication because of her risk of reactions and side effects.  She will think about it and let me know next month.  Because of her severe intolerance with the IV iron we are unable to do so.  She is also intolerant to oral iron therapy.  Current treatment:Blood transfusions as needed  Return to clinic in87month with labs and blood transfusion if needed.

## 2020-12-25 NOTE — Progress Notes (Signed)
Patient Care Team: Venia Carbon, MD as PCP - General Angelena Form Annita Brod, MD as PCP - Cardiology (Cardiology) Sharmon Revere as Physician Assistant (Physician Assistant)  DIAGNOSIS:    ICD-10-CM   1. Iron deficiency anemia due to chronic blood loss  D50.0       CHIEF COMPLIANT:  Follow-up of iron deficiency anemia and bilateral PEs  INTERVAL HISTORY: Chelsea Keller is a 86 y.o. with above-mentioned history of iron deficiency anemia previously treated with IV iron and who currently receives transfusions about every two months (last 10/11/20). She also has a history of bilateral PEs currently on Eliquis. She presents to the clinic today for follow-up.  She feels extremely tired and weak.  She notes that she is low in the hemoglobin.  Her last blood transfusion was in April.  ALLERGIES:  is allergic to codeine sulfate, celecoxib, erythromycin base, and spironolactone.  MEDICATIONS:  Current Outpatient Medications  Medication Sig Dispense Refill   acetaminophen (TYLENOL) 500 MG tablet Take 1,000-1,500 mg by mouth every 6 (six) hours as needed for moderate pain or headache (pain).      allopurinol (ZYLOPRIM) 300 MG tablet Take 1 tablet (300 mg total) by mouth daily. (Patient not taking: Reported on 12/19/2020) 30 tablet 6   apixaban (ELIQUIS) 2.5 MG TABS tablet Take 1 tablet (2.5 mg total) by mouth 2 (two) times daily. 180 tablet 3   cephALEXin (KEFLEX) 500 MG capsule Take 1 capsule (500 mg total) by mouth 3 (three) times daily. 21 capsule 0   colchicine 0.6 MG tablet Take 1 tablet (0.6 mg total) by mouth 2 (two) times daily. (Patient not taking: Reported on 12/19/2020) 60 tablet 11   Crisaborole (EUCRISA) 2 % OINT Apply to skin daily 60 g 4   gabapentin (NEURONTIN) 300 MG capsule Take 1 capsule in the morning and 2 capsules at bedtime 270 capsule 3   hydrOXYzine (ATARAX/VISTARIL) 10 MG tablet TAKE 1 TABLET BY MOUTH THREE TIMES A DAY AS NEEDED 90 tablet 0   KLOR-CON M20 20 MEQ  tablet Take 20 mEq by mouth 2 (two) times daily.     ondansetron (ZOFRAN) 4 MG tablet Take 1 tablet (4 mg total) by mouth every 8 (eight) hours as needed for nausea or vomiting. 20 tablet 0   oxyCODONE-acetaminophen (PERCOCET/ROXICET) 5-325 MG tablet Take 1 tablet by mouth every 4 (four) hours as needed for severe pain. (Patient not taking: Reported on 12/19/2020) 10 tablet 0   predniSONE (DELTASONE) 20 MG tablet Take 2 tablets (40 mg total) by mouth daily. For 5 days, then 1 tab daily for 5 days. (Patient not taking: Reported on 12/19/2020) 15 tablet 1   torsemide (DEMADEX) 20 MG tablet Take 1 tablet (20 mg total) by mouth daily. (Patient not taking: Reported on 12/19/2020) 30 tablet 11   traMADol (ULTRAM) 50 MG tablet Take 1 tablet (50 mg total) by mouth 3 (three) times daily as needed for moderate pain. 60 tablet 0   triamterene-hydrochlorothiazide (MAXZIDE-25) 37.5-25 MG tablet TAKE 1 TABLET DAILY 90 tablet 3   No current facility-administered medications for this visit.    PHYSICAL EXAMINATION: ECOG PERFORMANCE STATUS: 2 - Symptomatic, <50% confined to bed  Vitals:   12/26/20 1101  BP: 136/61  Pulse: 80  Resp: 16  Temp: 97.6 F (36.4 C)  SpO2: 100%   Filed Weights   12/26/20 1101  Weight: 273 lb 9.6 oz (124.1 kg)     LABORATORY DATA:  I have reviewed the data  as listed CMP Latest Ref Rng & Units 10/16/2020 10/11/2020 07/18/2020  Glucose 70 - 99 mg/dL 102(H) 115(H) 126(H)  BUN 8 - 23 mg/dL 17 23 20   Creatinine 0.44 - 1.00 mg/dL 1.31(H) 0.91 1.26(H)  Sodium 135 - 145 mmol/L 135 144 137  Potassium 3.5 - 5.1 mmol/L 4.0 3.7 4.4  Chloride 98 - 111 mmol/L 103 108 102  CO2 22 - 32 mmol/L 22 23 22   Calcium 8.9 - 10.3 mg/dL 8.8(L) 8.6(L) 8.0(L)  Total Protein 6.5 - 8.1 g/dL 5.8(L) 5.9(L) 6.1(L)  Total Bilirubin 0.3 - 1.2 mg/dL 0.9 0.6 0.9  Alkaline Phos 38 - 126 U/L 81 97 82  AST 15 - 41 U/L 36 22 44(H)  ALT 0 - 44 U/L 19 15 16     Lab Results  Component Value Date   WBC 5.9  12/26/2020   HGB 7.9 (L) 12/26/2020   HCT 27.7 (L) 12/26/2020   MCV 78.5 (L) 12/26/2020   PLT 331 12/26/2020   NEUTROABS 3.1 12/26/2020    ASSESSMENT & PLAN:  Iron deficiency anemia due to chronic blood loss Hospitalization 08/11/2018: Severe GI bleed hemoglobin 4.9 (internal hemorrhoids and angiodysplasia of the colon)Received blood transfusions IV iron treatment: September 2019, March 2020, [facility] June 2020, Venofer January 2021    Toxicities of iron infusion:Patient gets profound diarrhea which last for 2 to 3 days after Feraheme (and prescription for Lomotil for 3 days).  With Venofer she did okay but she still had profound diarrhea as well as lethargic   10/10/2019: Hemoglobin 6.8: MCV 82, RDW 18: 2 units of PRBC given on 10/11/2019. 11/03/2019: Hemoglobin 8.1 (iron studies and erythropoietin levels are pending) 11/20/2019: Hemoglobin 7.1, MCV 81.2 Iron studies ferritin 7, iron saturation 6%, absolute reticulocyte count 88.3 05/24/2020: Hemoglobin 10.4 06/24/2020: Hemoglobin 9.5 08/28/2020: Hemoglobin 8.9 10/11/2020: Hemoglobin 8, ferritin 7 10/16/2020: Hemoglobin 10.9 (this is after blood transfusions) 11/26/2020: Hemoglobin 9.3 12/26/2020: Hemoglobin 7.9 (2 units PRBC   Blood transfusions: 11/29/2019, 01/02/2020, October 2021, April 2022, 12/26/2020   She does not want to receive Aranesp injections.  Previously she received 1 dose and did not show any improvement at that time in May 2021.   Because of her severe intolerance with the IV iron we are unable to do so.  She is also intolerant to oral iron therapy.   Current treatment: Blood transfusions will be given today 2 units   Return to clinic in 2 months with labs and blood transfusion if needed.      No orders of the defined types were placed in this encounter.  The patient has a good understanding of the overall plan. she agrees with it. she will call with any problems that may develop before the next visit here.  Total time  spent: 20 mins including face to face time and time spent for planning, charting and coordination of care  Rulon Eisenmenger, MD, MPH 12/26/2020  I, Thana Ates, am acting as scribe for Dr. Nicholas Lose.  I have reviewed the above documentation for accuracy and completeness, and I agree with the above.

## 2020-12-26 ENCOUNTER — Other Ambulatory Visit: Payer: Self-pay

## 2020-12-26 ENCOUNTER — Other Ambulatory Visit: Payer: Self-pay | Admitting: *Deleted

## 2020-12-26 ENCOUNTER — Inpatient Hospital Stay: Payer: Medicare Other | Attending: Hematology and Oncology

## 2020-12-26 ENCOUNTER — Inpatient Hospital Stay: Payer: Medicare Other

## 2020-12-26 ENCOUNTER — Inpatient Hospital Stay (HOSPITAL_BASED_OUTPATIENT_CLINIC_OR_DEPARTMENT_OTHER): Payer: Medicare Other | Admitting: Hematology and Oncology

## 2020-12-26 DIAGNOSIS — D500 Iron deficiency anemia secondary to blood loss (chronic): Secondary | ICD-10-CM

## 2020-12-26 DIAGNOSIS — Z885 Allergy status to narcotic agent status: Secondary | ICD-10-CM | POA: Diagnosis not present

## 2020-12-26 DIAGNOSIS — Z86711 Personal history of pulmonary embolism: Secondary | ICD-10-CM | POA: Diagnosis not present

## 2020-12-26 DIAGNOSIS — R5383 Other fatigue: Secondary | ICD-10-CM | POA: Insufficient documentation

## 2020-12-26 DIAGNOSIS — K922 Gastrointestinal hemorrhage, unspecified: Secondary | ICD-10-CM | POA: Insufficient documentation

## 2020-12-26 DIAGNOSIS — D649 Anemia, unspecified: Secondary | ICD-10-CM

## 2020-12-26 DIAGNOSIS — K648 Other hemorrhoids: Secondary | ICD-10-CM | POA: Insufficient documentation

## 2020-12-26 DIAGNOSIS — R197 Diarrhea, unspecified: Secondary | ICD-10-CM | POA: Diagnosis not present

## 2020-12-26 DIAGNOSIS — Z7901 Long term (current) use of anticoagulants: Secondary | ICD-10-CM | POA: Diagnosis not present

## 2020-12-26 DIAGNOSIS — R531 Weakness: Secondary | ICD-10-CM | POA: Insufficient documentation

## 2020-12-26 DIAGNOSIS — Z79899 Other long term (current) drug therapy: Secondary | ICD-10-CM | POA: Diagnosis not present

## 2020-12-26 DIAGNOSIS — Z881 Allergy status to other antibiotic agents status: Secondary | ICD-10-CM | POA: Insufficient documentation

## 2020-12-26 LAB — CBC WITH DIFFERENTIAL (CANCER CENTER ONLY)
Abs Immature Granulocytes: 0.02 10*3/uL (ref 0.00–0.07)
Basophils Absolute: 0.1 10*3/uL (ref 0.0–0.1)
Basophils Relative: 1 %
Eosinophils Absolute: 0.8 10*3/uL — ABNORMAL HIGH (ref 0.0–0.5)
Eosinophils Relative: 14 %
HCT: 27.7 % — ABNORMAL LOW (ref 36.0–46.0)
Hemoglobin: 7.9 g/dL — ABNORMAL LOW (ref 12.0–15.0)
Immature Granulocytes: 0 %
Lymphocytes Relative: 20 %
Lymphs Abs: 1.2 10*3/uL (ref 0.7–4.0)
MCH: 22.4 pg — ABNORMAL LOW (ref 26.0–34.0)
MCHC: 28.5 g/dL — ABNORMAL LOW (ref 30.0–36.0)
MCV: 78.5 fL — ABNORMAL LOW (ref 80.0–100.0)
Monocytes Absolute: 0.7 10*3/uL (ref 0.1–1.0)
Monocytes Relative: 12 %
Neutro Abs: 3.1 10*3/uL (ref 1.7–7.7)
Neutrophils Relative %: 53 %
Platelet Count: 331 10*3/uL (ref 150–400)
RBC: 3.53 MIL/uL — ABNORMAL LOW (ref 3.87–5.11)
RDW: 19.1 % — ABNORMAL HIGH (ref 11.5–15.5)
WBC Count: 5.9 10*3/uL (ref 4.0–10.5)
nRBC: 0 % (ref 0.0–0.2)

## 2020-12-26 LAB — PREPARE RBC (CROSSMATCH)

## 2020-12-26 LAB — SAMPLE TO BLOOD BANK

## 2020-12-26 LAB — IRON AND TIBC
Iron: 24 ug/dL — ABNORMAL LOW (ref 41–142)
Saturation Ratios: 6 % — ABNORMAL LOW (ref 21–57)
TIBC: 373 ug/dL (ref 236–444)
UIBC: 349 ug/dL (ref 120–384)

## 2020-12-26 LAB — FERRITIN: Ferritin: 5 ng/mL — ABNORMAL LOW (ref 11–307)

## 2020-12-26 MED ORDER — ACETAMINOPHEN 325 MG PO TABS
650.0000 mg | ORAL_TABLET | Freq: Once | ORAL | Status: AC
  Administered 2020-12-26: 650 mg via ORAL

## 2020-12-26 MED ORDER — DIPHENHYDRAMINE HCL 25 MG PO CAPS
ORAL_CAPSULE | ORAL | Status: AC
  Filled 2020-12-26: qty 1

## 2020-12-26 MED ORDER — DIPHENHYDRAMINE HCL 25 MG PO CAPS
25.0000 mg | ORAL_CAPSULE | Freq: Once | ORAL | Status: AC
Start: 2020-12-26 — End: 2020-12-26
  Administered 2020-12-26: 25 mg via ORAL

## 2020-12-26 MED ORDER — SODIUM CHLORIDE 0.9% IV SOLUTION
250.0000 mL | Freq: Once | INTRAVENOUS | Status: AC
  Administered 2020-12-26: 250 mL via INTRAVENOUS
  Filled 2020-12-26: qty 250

## 2020-12-26 MED ORDER — ACETAMINOPHEN 325 MG PO TABS
ORAL_TABLET | ORAL | Status: AC
  Filled 2020-12-26: qty 2

## 2020-12-26 NOTE — Patient Instructions (Signed)
Blood Transfusion, Adult, Care After This sheet gives you information about how to care for yourself after your procedure. Your doctor may also give you more specific instructions. If youhave problems or questions, contact your doctor. What can I expect after the procedure? After the procedure, it is common to have: Bruising and soreness at the IV site. A fever or chills on the day of the procedure. This may be your body's response to the new blood cells received. A headache. Follow these instructions at home: Insertion site care     Follow instructions from your doctor about how to take care of your insertion site. This is where an IV tube was put into your vein. Make sure you: Wash your hands with soap and water before and after you change your bandage (dressing). If you cannot use soap and water, use hand sanitizer. Change your bandage as told by your doctor. Check your insertion site every day for signs of infection. Check for: Redness, swelling, or pain. Bleeding from the site. Warmth. Pus or a bad smell. General instructions Take over-the-counter and prescription medicines only as told by your doctor. Rest as told by your doctor. Go back to your normal activities as told by your doctor. Keep all follow-up visits as told by your doctor. This is important. Contact a doctor if: You have itching or red, swollen areas of skin (hives). You feel worried or nervous (anxious). You feel weak after doing your normal activities. You have redness, swelling, warmth, or pain around the insertion site. You have blood coming from the insertion site, and the blood does not stop with pressure. You have pus or a bad smell coming from the insertion site. Get help right away if: You have signs of a serious reaction. This may be coming from an allergy or the body's defense system (immune system). Signs include: Trouble breathing or shortness of breath. Swelling of the face or feeling warm  (flushed). Fever or chills. Head, chest, or back pain. Dark pee (urine) or blood in the pee. Widespread rash. Fast heartbeat. Feeling dizzy or light-headed. You may receive your blood transfusion in an outpatient setting. If so, youwill be told whom to contact to report any reactions. These symptoms may be an emergency. Do not wait to see if the symptoms will go away. Get medical help right away. Call your local emergency services (911 in the [place] Do not drive yourself to the hospital. Summary Bruising and soreness at the IV site are common. Check your insertion site every day for signs of infection. Rest as told by your doctor. Go back to your normal activities as told by your doctor. Get help right away if you have signs of a serious reaction. This information is not intended to replace advice given to you by your health care provider. Make sure you discuss any questions you have with your healthcare provider. Document Revised: 12/15/2018 Document Reviewed: 12/15/2018 Elsevier Patient Education  2022 Elsevier Inc.  

## 2020-12-27 LAB — TYPE AND SCREEN
ABO/RH(D): B POS
Antibody Screen: NEGATIVE
Unit division: 0
Unit division: 0

## 2020-12-27 LAB — BPAM RBC
Blood Product Expiration Date: 202207162359
Blood Product Expiration Date: 202207162359
ISSUE DATE / TIME: 202206231234
ISSUE DATE / TIME: 202206231234
Unit Type and Rh: 7300
Unit Type and Rh: 7300

## 2020-12-30 ENCOUNTER — Other Ambulatory Visit: Payer: Self-pay

## 2020-12-30 ENCOUNTER — Telehealth: Payer: Self-pay | Admitting: Hematology and Oncology

## 2020-12-30 ENCOUNTER — Encounter: Payer: Self-pay | Admitting: Cardiovascular Disease

## 2020-12-30 ENCOUNTER — Ambulatory Visit (INDEPENDENT_AMBULATORY_CARE_PROVIDER_SITE_OTHER): Payer: Medicare Other | Admitting: Cardiovascular Disease

## 2020-12-30 VITALS — BP 142/70 | HR 74 | Ht 62.0 in | Wt 274.0 lb

## 2020-12-30 DIAGNOSIS — I5032 Chronic diastolic (congestive) heart failure: Secondary | ICD-10-CM | POA: Diagnosis not present

## 2020-12-30 DIAGNOSIS — I10 Essential (primary) hypertension: Secondary | ICD-10-CM

## 2020-12-30 NOTE — Telephone Encounter (Signed)
Scheduled appointment per 06/23 los. Patient is aware.

## 2020-12-30 NOTE — Progress Notes (Signed)
0  Chief Complaint  Patient presents with   Follow-up    Chronic diastolic CHF    History of Present Illness: 86 yo female with history of anemia/ GI bleeding, chronic diastolic CHF, PE, obesity, HTN, PVCs and chronic lower extremity edema here today for cardiac follow up. She has a remote history of tobacco abuse. She had a normal myoview stress test in April of 2008. Her echo in May 2010 showed normal LV function and no significant valvular issues. I saw her in January 2016 and she c/o weakness, dizziness, dyspnea.  She was seen in primary care but chest x-ray was ok. She has chronic lower extremity edema and had been off of her Lasix. She endorsed a cough productive of clear sputum. She was admitted to [facility] for diuresis and aldactone was added. Echo with normal LV function in 2016. CTA chest negative for PE (elevated d-dimer). She was found to be anemic with Hgb of 8.7 (microcytic hypochromic).  She was referred to GI and underwent EGD and colonoscopy 10/15/14. This showed gastritis, AVMs in the colon, polyps and divertculosis, hemorrhoids. Echo in 2018 with normal LV systolic function, grade 1 diastolic dysfunction and trivial AI. She was admitted to [facility] December 2018 with dyspnea and was found to have bilateral pulmonary emboli. She was started on Eliquis. She called our office 03/21/18 with c/o dyspnea, chest pressure and dizziness. We asked her to come into the ED. Chest x-ray was ok. She was found to have severe anemia (Hgb 4.7) felt to be due to active GI bleeding. She was found to have AVMs which were treated. She was transfused 4 units of pRBCs. Her symptoms resolved with resolution of her anemia. She was seen in the ED 04/11/18 with a GI illness with nausea and abdominal pain/dark stools. Blood counts were stable. CT abdomen without acute cause of abdominal pain. In October 2019 she was seen for dizziness. Cardiac monitor with frequent PVCs. She did not wish to start Cardizem. Eliquis was stopped  by hematology in March 2020. She was feeling better at office follow up in January 2020. Most recent echo December 2020 with LVEF=60-65%, mild MR. This was done while she was admitted in December 2020 with recurrent PE, pneumonia and possible LE cellulitis.   She is here today for follow up. The patient denies any chest pain, dyspnea, palpitations, lower extremity edema, orthopnea, PND, dizziness, near syncope or syncope.    Primary Care Physician: Venia Carbon, MD  Past Medical History:  Diagnosis Date   Allergy    Anemia    Anxiety    Arthritis    Arthritis of sacroiliac joint of both sides 11/12/2017   Bilateral pulmonary embolism (HCC) 06/23/2017   Chronic diastolic CHF (congestive heart failure) ([place])    a. Echo 1/16:  mild LVH, EF normal, grade 1 DD, MAC   Chronic venous insufficiency    chronic LE edema   DDD (degenerative disc disease), lumbar 11/12/2017   Degenerative joint disease (DJD) of hip, Bilateral  11/12/2017   Depression    Fibromyalgia    constant pain   Hx of cardiac catheterization    a. LHC in [place] "ok" per patient with mild plaque in a single vessel - records not available   Hx of cardiovascular stress test    a. Nuclear study in 2008 normal   Hx of colonic polyps    Hypertension    Hypertriglyceridemia    Impaired fasting glucose    PONV (postoperative nausea and  vomiting)    PUD (peptic ulcer disease)    hx of gastric ulcer   Pulmonary emboli ([place]) 06/2017   Vitamin B12 deficiency     Past Surgical History:  Procedure Laterality Date   ABDOMINAL HYSTERECTOMY     CATARACT EXTRACTION  10/2003   OD   CHOLECYSTECTOMY     COLONOSCOPY W/ POLYPECTOMY     COLONOSCOPY WITH PROPOFOL N/A 10/15/2014   Procedure: COLONOSCOPY WITH PROPOFOL;  Surgeon: Ladene Artist, MD;  Location: [facility] ENDOSCOPY;  Service: Endoscopy;  Laterality: N/A;   COLONOSCOPY WITH PROPOFOL N/A 03/23/2018   Procedure: COLONOSCOPY WITH PROPOFOL;  Surgeon: Gatha Mayer, MD;  Location: [facility]  ENDOSCOPY;  Service: Endoscopy;  Laterality: N/A;   ESOPHAGOGASTRODUODENOSCOPY (EGD) WITH PROPOFOL N/A 10/15/2014   Procedure: ESOPHAGOGASTRODUODENOSCOPY (EGD) WITH PROPOFOL;  Surgeon: Ladene Artist, MD;  Location: [facility] ENDOSCOPY;  Service: Endoscopy;  Laterality: N/A;   EXTERNAL FIXATION ANKLE FRACTURE     Fx.  left ankle-fixation with pins later removed sec to infection [place]   left with pins   EYE SURGERY     FEMUR FRACTURE SURGERY  06/2009   FRACTURE SURGERY     HARDWARE REMOVAL Left 10/05/2013   Procedure: HARDWARE REMOVAL LEFT DISTAL FEMUR;  Surgeon: Rozanna Box, MD;  Location: [facility];  Service: Orthopedics;  Laterality: Left;   HEMIARTHROPLASTY SHOULDER FRACTURE  06/2009   HOT HEMOSTASIS N/A 03/23/2018   Procedure: HOT HEMOSTASIS (ARGON PLASMA COAGULATION/BICAP);  Surgeon: Gatha Mayer, MD;  Location: [facility] ENDOSCOPY;  Service: Endoscopy;  Laterality: N/A;   JOINT REPLACEMENT     STERIOD INJECTION Right 10/05/2013   Procedure: STEROID INJECTION;  Surgeon: Rozanna Box, MD;  Location: [facility];  Service: Orthopedics;  Laterality: Right;   TONSILLECTOMY     TONSILLECTOMY     TOTAL KNEE ARTHROPLASTY  03/09   left    Current Outpatient Medications  Medication Sig Dispense Refill   acetaminophen (TYLENOL) 500 MG tablet Take 1,000-1,500 mg by mouth every 6 (six) hours as needed for moderate pain or headache (pain).      allopurinol (ZYLOPRIM) 300 MG tablet Take 1 tablet (300 mg total) by mouth daily. 30 tablet 6   apixaban (ELIQUIS) 2.5 MG TABS tablet Take 1 tablet (2.5 mg total) by mouth 2 (two) times daily. 180 tablet 3   cephALEXin (KEFLEX) 500 MG capsule Take 1 capsule (500 mg total) by mouth 3 (three) times daily. 21 capsule 0   colchicine 0.6 MG tablet Take 1 tablet (0.6 mg total) by mouth 2 (two) times daily. 60 tablet 11   Crisaborole (EUCRISA) 2 % OINT Apply to skin daily 60 g 4   gabapentin (NEURONTIN) 300 MG capsule Take 1 capsule in  the morning and 2 capsules at bedtime 270 capsule 3   hydrOXYzine (ATARAX/VISTARIL) 10 MG tablet TAKE 1 TABLET BY MOUTH THREE TIMES A DAY AS NEEDED 90 tablet 0   KLOR-CON M20 20 MEQ tablet Take 20 mEq by mouth 2 (two) times daily.     ondansetron (ZOFRAN) 4 MG tablet Take 1 tablet (4 mg total) by mouth every 8 (eight) hours as needed for nausea or vomiting. 20 tablet 0   oxyCODONE-acetaminophen (PERCOCET/ROXICET) 5-325 MG tablet Take 1 tablet by mouth every 4 (four) hours as needed for severe pain. 10 tablet 0   predniSONE (DELTASONE) 20 MG tablet Take 40 mg by mouth as needed.     torsemide (DEMADEX) 20 MG  tablet Take 20 mg by mouth as needed.     traMADol (ULTRAM) 50 MG tablet Take 1 tablet (50 mg total) by mouth 3 (three) times daily as needed for moderate pain. 60 tablet 0   triamterene-hydrochlorothiazide (MAXZIDE-25) 37.5-25 MG tablet TAKE 1 TABLET DAILY 90 tablet 3   No current facility-administered medications for this visit.    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  Tobacco Use   Smoking status: Former    Packs/day: 1.00    Years: 40.00    Pack years: 40.00    Types: Cigarettes    Quit date: 07/06/1993    Years since quitting: 27.5   Smokeless tobacco: Never  Vaping Use   Vaping Use: Never used  Substance and Sexual Activity   Alcohol use: No    Alcohol/week: 0.0 standard drinks   Drug use: No   Sexual activity: Not Currently  Other Topics  Concern   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 and adult grandsons   Former smoker, no EtOH      No living will   Plans to do health care POA forms---wants daughter [name]   Would accept resuscitation attempts---no prolonged ventilation   Absolutely no feeding tube   Social Determinants of Health   Financial Resource Strain: Not on file  Food Insecurity: Not on file  Transportation Needs: Not on file  Physical Activity: Not on file  Stress: Not on file  Social Connections: Not on file  Intimate Partner Violence: Not on file    Family History  Problem Relation Age of Onset   Depression Mother    Cancer Mother        uterine cancer   Heart attack Father    Cancer Brother        prostate cancer   Diabetes Maternal Aunt    Arthritis Brother    Asthma Brother    Stroke Maternal Grandmother    Pulmonary embolism Daughter     Review of Systems:  As stated in the HPI and otherwise negative.   BP (!) 142/70   Pulse 74   Ht _0  (1.575 m)   Wt 274 lb (124.3 kg)   SpO2 98%   BMI 50.12 kg/m   Physical Examination:  General: Well developed, well nourished, NAD  HEENT: OP clear, mucus membranes moist  SKIN: warm, dry. No rashes. Neuro: No focal deficits  Musculoskeletal: Muscle strength 5/5 all ext  Psychiatric: Mood and affect normal  Neck: No JVD, no carotid bruits, no thyromegaly, no lymphadenopathy.  Lungs:Clear bilaterally, no wheezes, rhonci, crackles Cardiovascular: Regular rate and rhythm. No murmurs, gallops or rubs. Abdomen:Soft. Bowel sounds present. Non-tender.  Extremities: No lower extremity edema. Pulses are 2 + in the bilateral DP/PT.   Echo December 2020:  1.  Left ventricular ejection fraction, by visual estimation, is 60 to  65%. The left ventricle has normal function. There is mildly increased  left ventricular hypertrophy.   2. Left ventricular diastolic  parameters are consistent with Grade I  diastolic dysfunction (impaired relaxation).   3. The left ventricle has no regional wall motion abnormalities.   4. Global right ventricle has normal systolic function.The right  ventricular size is normal. No increase in right ventricular wall  thickness.   5. Left atrial size was normal.   6. Right atrial size was normal.   7. Mild mitral annular calcification.   8. The mitral valve is abnormal. Mild mitral valve regurgitation.   9. The tricuspid valve is normal in structure. Tricuspid valve  regurgitation is mild.  10. The aortic valve is grossly normal. Aortic valve regurgitation is not  visualized.  11. The pulmonic valve was normal in structure. Pulmonic valve  regurgitation is not visualized.  12. Mildly elevated pulmonary artery systolic pressure.   EKG:  EKG is  ordered today. The ekg ordered today demonstrates Sinus  Recent Labs: 10/16/2020: ALT 19; B Natriuretic Peptide 17.6; BUN 17; Creatinine, Ser 1.31; Potassium 4.0; Sodium 135 12/26/2020: Hemoglobin 7.9; Platelet Count 331   Lipid Panel    Component Value Date/Time   CHOL 177 06/23/2017 1020   TRIG 116 06/18/2019 0004   HDL 44 06/23/2017 1020   CHOLHDL 4.0 06/23/2017 1020   VLDL 16 06/23/2017 1020   LDLCALC 117 (H) 06/23/2017 1020   LDLDIRECT 86.2 05/25/2008 1115     Wt Readings from Last 3 Encounters:  12/30/20 274 lb (124.3 kg)  12/26/20 273 lb 9.6 oz (124.1 kg)  12/19/20 270 lb (122.5 kg)     Other studies Reviewed: Additional studies/ records that were reviewed today include:  Review of the above records demonstrates:    Assessment and Plan:   1. Chronic diastolic CHF:. She has normal LV systolic function by echo in December 2020. She has grade 1 diastolic dysfunction.  Weight is stable. She has some lower extremity edema. She has not been taking torsemide.  Will resume Torsemide daily.   2. HTN: BP is well controlled. No changes  3. Bilateral PE: She is  on Eliquis again after recurrent PE in December 2020.    4. GI bleeding: GI bleeding due to [facility] and treated with [facility] September 2019.  Current medicines are reviewed at length with the patient today.  The patient does not have concerns regarding medicines.  The following changes have been made:  no change  Labs/ tests ordered today include:   Orders Placed This Encounter  Procedures   EKG 12-Lead    Disposition:   F/U with me in 12 months.     Signed, Lauree Chandler, MD 12/30/2020 11:12 AM    [facility] [address], [place], [place]  [address] Phone: [phone]; Fax: [phone]

## 2020-12-30 NOTE — Patient Instructions (Signed)
Medication Instructions:  Your physician recommends that you continue on your current medications as directed. Please refer to the Current Medication list given to you today.  *If you need a refill on your cardiac medications before your next appointment, please call your pharmacy*   Lab Work: None If you have labs (blood work) drawn today and your tests are completely normal, you will receive your results only by: [place] (if you have MyChart) OR A paper copy in the mail If you have any lab test that is abnormal or we need to change your treatment, we will call you to review the results.   Testing/Procedures: None   Follow-Up: At [facility], you and your health needs are our priority.  As part of our continuing mission to provide you with exceptional heart care, we have created designated Provider Care Teams.  These Care Teams include your primary Cardiologist (physician) and Advanced Practice Providers (APPs -  Physician Assistants and Nurse Practitioners) who all work together to provide you with the care you need, when you need it.  We recommend signing up for the patient portal called "MyChart".  Sign up information is provided on this After Visit Summary.  MyChart is used to connect with patients for Virtual Visits (Telemedicine).  Patients are able to view lab/test results, encounter notes, upcoming appointments, etc.  Non-urgent messages can be sent to your provider as well.   To learn more about what you can do with MyChart, go to [email or web address].    Your next appointment:   1 year(s)  The format for your next appointment:   In Person  Provider:   You may see Lauree Chandler, MD or one of the following Advanced Practice Providers on your designated Care Team:   Melina Copa, PA-C Ermalinda Barrios, PA-C   Other Instructions

## 2021-01-14 ENCOUNTER — Ambulatory Visit (INDEPENDENT_AMBULATORY_CARE_PROVIDER_SITE_OTHER): Payer: Medicare Other | Admitting: Internal Medicine

## 2021-01-14 ENCOUNTER — Encounter: Payer: Self-pay | Admitting: Internal Medicine

## 2021-01-14 ENCOUNTER — Ambulatory Visit: Payer: Medicare Other | Admitting: Internal Medicine

## 2021-01-14 ENCOUNTER — Other Ambulatory Visit: Payer: Self-pay

## 2021-01-14 VITALS — BP 124/64 | HR 59 | Temp 97.6°F | Ht 62.0 in | Wt 274.0 lb

## 2021-01-14 DIAGNOSIS — I5032 Chronic diastolic (congestive) heart failure: Secondary | ICD-10-CM

## 2021-01-14 DIAGNOSIS — M1000 Idiopathic gout, unspecified site: Secondary | ICD-10-CM

## 2021-01-14 LAB — RENAL FUNCTION PANEL
Albumin: 3.7 g/dL (ref 3.5–5.2)
BUN: 18 mg/dL (ref 6–23)
CO2: 27 mEq/L (ref 19–32)
Calcium: 9.4 mg/dL (ref 8.4–10.5)
Chloride: 104 mEq/L (ref 96–112)
Creatinine, Ser: 1.01 mg/dL (ref 0.40–1.20)
GFR: 51.22 mL/min — ABNORMAL LOW (ref >60.00)
Glucose, Bld: 110 mg/dL — ABNORMAL HIGH (ref 70–99)
Phosphorus: 4 mg/dL (ref 2.3–4.6)
Potassium: 4 mEq/L (ref 3.5–5.1)
Sodium: 140 mEq/L (ref 135–145)

## 2021-01-14 LAB — URIC ACID: Uric Acid, Serum: 7.6 mg/dL — ABNORMAL HIGH (ref 2.4–7.0)

## 2021-01-14 MED ORDER — TRAMADOL HCL 50 MG PO TABS: 50.0000 mg | ORAL_TABLET | Freq: Three times a day (TID) | ORAL | 0 refills | Status: DC | PRN

## 2021-01-14 NOTE — Assessment & Plan Note (Signed)
Weight is drifting back up again Hasn't restarted the torsemide---urged her to do that

## 2021-01-14 NOTE — Assessment & Plan Note (Signed)
Overall better on the allopurinol Will check uric acid levels Okay to take the colchicine again for slight flare Stop HCTZ--oversight that she is still on this

## 2021-01-14 NOTE — Progress Notes (Signed)
Subjective:    Patient ID: Chelsea Keller, female    DOB: June 13, 1935, 86 y.o.   MRN: [id number]  HPI Here with daughter for follow up of ongoing gout issues This visit occurred during the SARS-CoV-2 public health emergency.  Safety protocols were in place, including screening questions prior to the visit, additional usage of staff PPE, and extensive cleaning of exam room while observing appropriate contact time as indicated for disinfecting solutions.   Hands are not better Right hand started getting achy yesterday and worse today Taking the allopurinol but had stopped the colchicine (due to diarrhea)  Right chest mass never went away Redness is gone though  Asked to take the torsemide by Dr Mills Koller Hasn't started yet  Current Outpatient Medications on File Prior to Visit  Medication Sig Dispense Refill   acetaminophen (TYLENOL) 500 MG tablet Take 1,000-1,500 mg by mouth every 6 (six) hours as needed for moderate pain or headache (pain).      allopurinol (ZYLOPRIM) 300 MG tablet Take 1 tablet (300 mg total) by mouth daily. 30 tablet 6   apixaban (ELIQUIS) 2.5 MG TABS tablet Take 1 tablet (2.5 mg total) by mouth 2 (two) times daily. 180 tablet 3   colchicine 0.6 MG tablet Take 1 tablet (0.6 mg total) by mouth 2 (two) times daily. 60 tablet 11   Crisaborole (EUCRISA) 2 % OINT Apply to skin daily 60 g 4   gabapentin (NEURONTIN) 300 MG capsule Take 1 capsule in the morning and 2 capsules at bedtime 270 capsule 3   hydrOXYzine (ATARAX/VISTARIL) 10 MG tablet TAKE 1 TABLET BY MOUTH THREE TIMES A DAY AS NEEDED 90 tablet 0   KLOR-CON M20 20 MEQ tablet Take 20 mEq by mouth 2 (two) times daily.     ondansetron (ZOFRAN) 4 MG tablet Take 1 tablet (4 mg total) by mouth every 8 (eight) hours as needed for nausea or vomiting. 20 tablet 0   oxyCODONE-acetaminophen (PERCOCET/ROXICET) 5-325 MG tablet Take 1 tablet by mouth every 4 (four) hours as needed for severe pain. 10 tablet 0   predniSONE  (DELTASONE) 20 MG tablet Take 40 mg by mouth as needed.     torsemide (DEMADEX) 20 MG tablet Take 20 mg by mouth as needed.     traMADol (ULTRAM) 50 MG tablet Take 1 tablet (50 mg total) by mouth 3 (three) times daily as needed for moderate pain. 60 tablet 0   triamterene-hydrochlorothiazide (MAXZIDE-25) 37.5-25 MG tablet TAKE 1 TABLET DAILY 90 tablet 3   No current facility-administered medications on file prior to visit.    Allergies  Allergen Reactions   Codeine Sulfate Shortness Of Breath   Celecoxib Other (See Comments)    Caused vaginal bleeding   Erythromycin Base Nausea And Vomiting   Spironolactone Nausea Only and Other (See Comments)    Blurred vision, Upset stomach, lethargy    Past Medical History:  Diagnosis Date   Allergy    Anemia    Anxiety    Arthritis    Arthritis of sacroiliac joint of both sides 11/12/2017   Bilateral pulmonary embolism (HCC) 06/23/2017   Chronic diastolic CHF (congestive heart failure) ([place])    a. Echo 1/16:  mild LVH, EF normal, grade 1 DD, MAC   Chronic venous insufficiency    chronic LE edema   DDD (degenerative disc disease), lumbar 11/12/2017   Degenerative joint disease (DJD) of hip, Bilateral  11/12/2017   Depression    Fibromyalgia    constant pain  Hx of cardiac catheterization    a. LHC in [place] "ok" per patient with mild plaque in a single vessel - records not available   Hx of cardiovascular stress test    a. Nuclear study in 2008 normal   Hx of colonic polyps    Hypertension    Hypertriglyceridemia    Impaired fasting glucose    PONV (postoperative nausea and vomiting)    PUD (peptic ulcer disease)    hx of gastric ulcer   Pulmonary emboli ([place]) 06/2017   Vitamin B12 deficiency     Past Surgical History:  Procedure Laterality Date   ABDOMINAL HYSTERECTOMY     CATARACT EXTRACTION  10/2003   OD   CHOLECYSTECTOMY     COLONOSCOPY W/ POLYPECTOMY     COLONOSCOPY WITH PROPOFOL N/A 10/15/2014   Procedure: COLONOSCOPY WITH  PROPOFOL;  Surgeon: Ladene Artist, MD;  Location: [facility] ENDOSCOPY;  Service: Endoscopy;  Laterality: N/A;   COLONOSCOPY WITH PROPOFOL N/A 03/23/2018   Procedure: COLONOSCOPY WITH PROPOFOL;  Surgeon: Gatha Mayer, MD;  Location: [facility] ENDOSCOPY;  Service: Endoscopy;  Laterality: N/A;   ESOPHAGOGASTRODUODENOSCOPY (EGD) WITH PROPOFOL N/A 10/15/2014   Procedure: ESOPHAGOGASTRODUODENOSCOPY (EGD) WITH PROPOFOL;  Surgeon: Ladene Artist, MD;  Location: [facility] ENDOSCOPY;  Service: Endoscopy;  Laterality: N/A;   EXTERNAL FIXATION ANKLE FRACTURE     Fx.  left ankle-fixation with pins later removed sec to infection [place]   left with pins   EYE SURGERY     FEMUR FRACTURE SURGERY  06/2009   FRACTURE SURGERY     HARDWARE REMOVAL Left 10/05/2013   Procedure: HARDWARE REMOVAL LEFT DISTAL FEMUR;  Surgeon: Rozanna Box, MD;  Location: [place];  Service: Orthopedics;  Laterality: Left;   HEMIARTHROPLASTY SHOULDER FRACTURE  06/2009   HOT HEMOSTASIS N/A 03/23/2018   Procedure: HOT HEMOSTASIS (ARGON PLASMA COAGULATION/BICAP);  Surgeon: Gatha Mayer, MD;  Location: [facility] ENDOSCOPY;  Service: Endoscopy;  Laterality: N/A;   JOINT REPLACEMENT     STERIOD INJECTION Right 10/05/2013   Procedure: STEROID INJECTION;  Surgeon: Rozanna Box, MD;  Location: [place];  Service: Orthopedics;  Laterality: Right;   TONSILLECTOMY     TONSILLECTOMY     TOTAL KNEE ARTHROPLASTY  03/09   left    Family History  Problem Relation Age of Onset   Depression Mother    Cancer Mother        uterine cancer   Heart attack Father    Cancer Brother        prostate cancer   Diabetes Maternal Aunt    Arthritis Brother    Asthma Brother    Stroke Maternal Grandmother    Pulmonary embolism Daughter     Social History   Socioeconomic History   Marital status: Widowed    Spouse name: Not on file   Number of children: 4   Years of education: Not on file   Highest education level: Not on file   Occupational History   Occupation: retired crossing guard   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  Tobacco Use   Smoking status: Former    Packs/day: 1.00    Years: 40.00    Pack years: 40.00    Types: Cigarettes    Quit date: 07/06/1993    Years since quitting: 27.5   Smokeless tobacco: Never  Vaping Use   Vaping Use: Never used  Substance and Sexual Activity   Alcohol use:  No    Alcohol/week: 0.0 standard drinks   Drug use: No   Sexual activity: Not Currently  Other Topics Concern   Not on file  Social History Narrative   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 [employer], lives w/ daughter and adult grandsons   Former smoker, no EtOH      No living will   Plans to do health care POA forms---wants daughter [name]   Would accept resuscitation attempts---no prolonged ventilation   Absolutely no feeding tube   Social Determinants of Health   Financial Resource Strain: Not on file  Food Insecurity: Not on file  Transportation Needs: Not on file  Physical Activity: Not on file  Stress: Not on file  Social Connections: Not on file  Intimate Partner Violence: Not on file   Review of Systems Weight is up 10-15# from her dry weight Gets DOE just walking to and from the bathroom      Objective:   Physical Exam Musculoskeletal:     Comments: Tenderness at right [facility] only  Skin:    Comments: Well circumscribed spongy mass overlying medial right clavicle---moves in sub-q space freely           Assessment & Plan:

## 2021-01-14 NOTE — Patient Instructions (Signed)
Please stop the triamterene/HCTZ. Restart the torsemide daily until your weight gets back down again

## 2021-01-23 MED ORDER — ALLOPURINOL 100 MG PO TABS
100.0000 mg | ORAL_TABLET | Freq: Every day | ORAL | 6 refills | Status: DC
Start: 2021-01-23 — End: 2021-11-12

## 2021-01-23 MED ORDER — PREDNISONE 20 MG PO TABS: 40.0000 mg | ORAL_TABLET | Freq: Every day | ORAL | 0 refills | Status: DC

## 2021-02-05 MED ORDER — FLUCONAZOLE 150 MG PO TABS: 150.0000 mg | ORAL_TABLET | Freq: Once | ORAL | 1 refills | Status: AC

## 2021-02-12 ENCOUNTER — Other Ambulatory Visit: Payer: Self-pay | Admitting: Internal Medicine

## 2021-02-20 ENCOUNTER — Ambulatory Visit
Admission: RE | Admit: 2021-02-20 | Discharge: 2021-02-20 | Disposition: A | Discharge status: Home / Self Care | Payer: Medicare Other | Source: Ambulatory Visit | Attending: Orthopedic Surgery | Admitting: Orthopedic Surgery

## 2021-02-20 ENCOUNTER — Other Ambulatory Visit: Payer: Self-pay

## 2021-02-20 DIAGNOSIS — M1611 Unilateral primary osteoarthritis, right hip: Secondary | ICD-10-CM

## 2021-02-20 MED ORDER — METHYLPREDNISOLONE ACETATE 40 MG/ML INJ SUSP (RADIOLOG: 80.0000 mg | Freq: Once | INTRAMUSCULAR | Status: DC

## 2021-02-20 MED ORDER — IOPAMIDOL (ISOVUE-M 200) INJECTION 41%: 1.0000 mL | Freq: Once | INTRAMUSCULAR | Status: DC

## 2021-02-25 ENCOUNTER — Inpatient Hospital Stay: Payer: Medicare Other

## 2021-02-25 ENCOUNTER — Inpatient Hospital Stay: Payer: Medicare Other | Attending: Hematology and Oncology

## 2021-02-25 ENCOUNTER — Other Ambulatory Visit: Payer: Self-pay

## 2021-02-25 DIAGNOSIS — D509 Iron deficiency anemia, unspecified: Secondary | ICD-10-CM | POA: Insufficient documentation

## 2021-02-25 DIAGNOSIS — D500 Iron deficiency anemia secondary to blood loss (chronic): Secondary | ICD-10-CM

## 2021-02-25 LAB — CBC WITH DIFFERENTIAL (CANCER CENTER ONLY)
Abs Immature Granulocytes: 0.04 10*3/uL (ref 0.00–0.07)
Basophils Absolute: 0.1 10*3/uL (ref 0.0–0.1)
Basophils Relative: 1 %
Eosinophils Absolute: 0.4 10*3/uL (ref 0.0–0.5)
Eosinophils Relative: 7 %
HCT: 28.8 % — ABNORMAL LOW (ref 36.0–46.0)
Hemoglobin: 8.6 g/dL — ABNORMAL LOW (ref 12.0–15.0)
Immature Granulocytes: 1 %
Lymphocytes Relative: 20 %
Lymphs Abs: 1.2 10*3/uL (ref 0.7–4.0)
MCH: 23.8 pg — ABNORMAL LOW (ref 26.0–34.0)
MCHC: 29.9 g/dL — ABNORMAL LOW (ref 30.0–36.0)
MCV: 79.6 fL — ABNORMAL LOW (ref 80.0–100.0)
Monocytes Absolute: 0.8 10*3/uL (ref 0.1–1.0)
Monocytes Relative: 12 %
Neutro Abs: 3.7 10*3/uL (ref 1.7–7.7)
Neutrophils Relative %: 59 %
Platelet Count: 348 10*3/uL (ref 150–400)
RBC: 3.62 MIL/uL — ABNORMAL LOW (ref 3.87–5.11)
RDW: 18.7 % — ABNORMAL HIGH (ref 11.5–15.5)
WBC Count: 6.1 10*3/uL (ref 4.0–10.5)
nRBC: 0 % (ref 0.0–0.2)

## 2021-02-25 LAB — SAMPLE TO BLOOD BANK

## 2021-02-25 LAB — FERRITIN: Ferritin: 9 ng/mL — ABNORMAL LOW (ref 11–307)

## 2021-02-25 NOTE — Patient Instructions (Signed)
[employer]man[place] medicine (25th ed., pp. 1059-1068). [place], [place]: Elsevier.">  Anemia  Anemia is a condition in which there is not enough red blood cells or hemoglobin in the blood. Hemoglobin is a substance in red blood cells thatcarries oxygen. When you do not have enough red blood cells or hemoglobin (are anemic), your body cannot get enough oxygen and your organs may not work properly. Asa result, you may feel very tired or have other problems. What are the causes? Common causes of anemia include: Excessive bleeding. Anemia can be caused by excessive bleeding inside or outside the body, including bleeding from the intestines or from heavy menstrual periods in females. Poor nutrition. Long-lasting (chronic) kidney, thyroid, and liver disease. Bone marrow disorders, spleen problems, and blood disorders. Cancer and treatments for cancer. HIV (human immunodeficiency virus) and AIDS (acquired immunodeficiency syndrome). Infections, medicines, and autoimmune disorders that destroy red blood cells. What are the signs or symptoms? Symptoms of this condition include: Minor weakness. Dizziness. Headache, or difficulties concentrating and sleeping. Heartbeats that feel irregular or faster than normal (palpitations). Shortness of breath, especially with exercise. Pale skin, lips, and nails, or cold hands and feet. Indigestion and nausea. Symptoms may occur suddenly or develop slowly. If your anemia is mild, you maynot have symptoms. How is this diagnosed? This condition is diagnosed based on blood tests, your medical history, and a physical exam. In some cases, a test may be needed in which cells are removed from the soft tissue inside of a bone and looked at under a microscope (bone marrow biopsy). Your health care provider may also check your stool (feces) for blood and may do additional testing to look for the cause of yourbleeding. Other tests may include: Imaging tests, such as a CT scan or  MRI. A procedure to see inside your esophagus and stomach (endoscopy). A procedure to see inside your colon and rectum (colonoscopy). How is this treated? Treatment for this condition depends on the cause. If you continue to lose a lot of blood, you may need to be treated at a hospital. Treatment may include: Taking supplements of iron, vitamin B12, or folic acid. Taking a hormone medicine (erythropoietin) that can help to stimulate red blood cell growth. Having a blood transfusion. This may be needed if you lose a lot of blood. Making changes to your diet. Having surgery to remove your spleen. Follow these instructions at home: Take over-the-counter and prescription medicines only as told by your health care provider. Take supplements only as told by your health care provider. Follow any diet instructions that you were given by your health care provider. Keep all follow-up visits as told by your health care provider. This is important. Contact a health care provider if: You develop new bleeding anywhere in the body. Get help right away if: You are very weak. You are short of breath. You have pain in your abdomen or chest. You are dizzy or feel faint. You have trouble concentrating. You have bloody stools, black stools, or tarry stools. You vomit repeatedly or you vomit up blood. These symptoms may represent a serious problem that is an emergency. Do not wait to see if the symptoms will go away. Get medical help right away. Call your local emergency services (911 in the [place] Do not drive yourself to the hospital. Summary Anemia is a condition in which you do not have enough red blood cells or enough of a substance in your red blood cells that carries oxygen (hemoglobin). Symptoms may occur suddenly   or develop slowly. If your anemia is mild, you may not have symptoms. This condition is diagnosed with blood tests, a medical history, and a physical exam. Other tests may be  needed. Treatment for this condition depends on the cause of the anemia. This information is not intended to replace advice given to you by your health care provider. Make sure you discuss any questions you have with your healthcare provider. Document Revised: 05/30/2019 Document Reviewed: 05/30/2019 Elsevier Patient Education  2022 [employer].

## 2021-02-25 NOTE — Progress Notes (Signed)
Per Dr. Lindi Adie no blood transfusion today for hemoglobin 8.6. Pt. Asymptomatic for anemia. Will monitor and reschedule lab for 1 month. Scheduling message sent by Mickel Baas LPN.

## 2021-03-04 ENCOUNTER — Other Ambulatory Visit: Payer: Self-pay

## 2021-03-05 MED ORDER — TRAMADOL HCL 50 MG PO TABS: 50.0000 mg | ORAL_TABLET | Freq: Three times a day (TID) | ORAL | 0 refills | Status: DC | PRN

## 2021-03-05 NOTE — Telephone Encounter (Signed)
Last OV - 01/14/2021 Next OV - N/A Last Filled - 01/14/2021

## 2021-03-18 ENCOUNTER — Encounter: Payer: Self-pay | Admitting: Hematology and Oncology

## 2021-03-18 ENCOUNTER — Other Ambulatory Visit: Payer: Self-pay | Admitting: *Deleted

## 2021-03-18 DIAGNOSIS — D649 Anemia, unspecified: Secondary | ICD-10-CM

## 2021-03-19 ENCOUNTER — Other Ambulatory Visit: Payer: Self-pay | Admitting: *Deleted

## 2021-03-19 ENCOUNTER — Inpatient Hospital Stay: Payer: Medicare Other | Attending: Hematology and Oncology

## 2021-03-19 ENCOUNTER — Other Ambulatory Visit: Payer: Self-pay

## 2021-03-19 DIAGNOSIS — K922 Gastrointestinal hemorrhage, unspecified: Secondary | ICD-10-CM | POA: Insufficient documentation

## 2021-03-19 DIAGNOSIS — D500 Iron deficiency anemia secondary to blood loss (chronic): Secondary | ICD-10-CM | POA: Diagnosis not present

## 2021-03-19 DIAGNOSIS — D649 Anemia, unspecified: Secondary | ICD-10-CM

## 2021-03-19 LAB — CBC WITH DIFFERENTIAL (CANCER CENTER ONLY)
Abs Immature Granulocytes: 0.03 10*3/uL (ref 0.00–0.07)
Basophils Absolute: 0.1 10*3/uL (ref 0.0–0.1)
Basophils Relative: 1 %
Eosinophils Absolute: 0.6 10*3/uL — ABNORMAL HIGH (ref 0.0–0.5)
Eosinophils Relative: 8 %
HCT: 27.5 % — ABNORMAL LOW (ref 36.0–46.0)
Hemoglobin: 7.8 g/dL — ABNORMAL LOW (ref 12.0–15.0)
Immature Granulocytes: 0 %
Lymphocytes Relative: 18 %
Lymphs Abs: 1.3 10*3/uL (ref 0.7–4.0)
MCH: 22.7 pg — ABNORMAL LOW (ref 26.0–34.0)
MCHC: 28.4 g/dL — ABNORMAL LOW (ref 30.0–36.0)
MCV: 79.9 fL — ABNORMAL LOW (ref 80.0–100.0)
Monocytes Absolute: 0.6 10*3/uL (ref 0.1–1.0)
Monocytes Relative: 9 %
Neutro Abs: 4.6 10*3/uL (ref 1.7–7.7)
Neutrophils Relative %: 64 %
Platelet Count: 272 10*3/uL (ref 150–400)
RBC: 3.44 MIL/uL — ABNORMAL LOW (ref 3.87–5.11)
RDW: 18.5 % — ABNORMAL HIGH (ref 11.5–15.5)
WBC Count: 7.1 10*3/uL (ref 4.0–10.5)
nRBC: 0 % (ref 0.0–0.2)

## 2021-03-19 LAB — IRON AND TIBC
Iron: 29 ug/dL — ABNORMAL LOW (ref 41–142)
Saturation Ratios: 7 % — ABNORMAL LOW (ref 21–57)
TIBC: 399 ug/dL (ref 236–444)
UIBC: 370 ug/dL (ref 120–384)

## 2021-03-19 LAB — SAMPLE TO BLOOD BANK

## 2021-03-19 LAB — PREPARE RBC (CROSSMATCH)

## 2021-03-19 LAB — FERRITIN: Ferritin: 8 ng/mL — ABNORMAL LOW (ref 11–307)

## 2021-03-19 NOTE — Progress Notes (Signed)
CRITICAL VALUE STICKER  CRITICAL VALUE: Hgb 7.8  RECEIVER Ruthanna Macchia,RN  DATE & TIME NOTIFIED: 03/19/21 at 1140   MD NOTIFIED: Nicholas Lose, MD  RESPONSE:  Per MD pt needing to receive 2 units PRBC's.  Orders placed, appt scheduled and pt verbalized understanding of date and time of appt.

## 2021-03-20 ENCOUNTER — Inpatient Hospital Stay: Payer: Medicare Other

## 2021-03-20 DIAGNOSIS — K922 Gastrointestinal hemorrhage, unspecified: Secondary | ICD-10-CM | POA: Diagnosis not present

## 2021-03-20 DIAGNOSIS — D500 Iron deficiency anemia secondary to blood loss (chronic): Secondary | ICD-10-CM | POA: Diagnosis not present

## 2021-03-20 MED ORDER — DIPHENHYDRAMINE HCL 25 MG PO CAPS
25.0000 mg | ORAL_CAPSULE | Freq: Once | ORAL | Status: AC
  Administered 2021-03-20: 25 mg via ORAL
  Filled 2021-03-20: qty 1

## 2021-03-20 MED ORDER — ACETAMINOPHEN 325 MG PO TABS: 650.0000 mg | ORAL_TABLET | Freq: Once | ORAL | Status: DC

## 2021-03-20 MED ORDER — SODIUM CHLORIDE 0.9% FLUSH: 10.0000 mL | INTRAVENOUS | Status: DC | PRN

## 2021-03-20 MED ORDER — HEPARIN SOD (PORK) LOCK FLUSH 100 UNIT/ML IV SOLN: 250.0000 [IU] | INTRAVENOUS | Status: DC | PRN

## 2021-03-20 MED ORDER — SODIUM CHLORIDE 0.9% IV SOLUTION
250.0000 mL | Freq: Once | INTRAVENOUS | Status: AC
  Administered 2021-03-20: 250 mL via INTRAVENOUS

## 2021-03-20 NOTE — Patient Instructions (Signed)
Blood Transfusion, Adult, Care After This sheet gives you information about how to care for yourself after your procedure. Your doctor may also give you more specific instructions. If you have problems or questions, contact your doctor. What can I expect after the procedure? After the procedure, it is common to have: Bruising and soreness at the IV site. A headache. Follow these instructions at home: Insertion site care   Follow instructions from your doctor about how to take care of your insertion site. This is where an IV tube was put into your vein. Make sure you: Wash your hands with soap and water before and after you change your bandage (dressing). If you cannot use soap and water, use hand sanitizer. Change your bandage as told by your doctor. Check your insertion site every day for signs of infection. Check for: Redness, swelling, or pain. Bleeding from the site. Warmth. Pus or a bad smell. General instructions Take over-the-counter and prescription medicines only as told by your doctor. Rest as told by your doctor. Go back to your normal activities as told by your doctor. Keep all follow-up visits as told by your doctor. This is important. Contact a doctor if: You have itching or red, swollen areas of skin (hives). You feel worried or nervous (anxious). You feel weak after doing your normal activities. You have redness, swelling, warmth, or pain around the insertion site. You have blood coming from the insertion site, and the blood does not stop with pressure. You have pus or a bad smell coming from the insertion site. Get help right away if: You have signs of a serious reaction. This may be coming from an allergy or the body's defense system (immune system). Signs include: Trouble breathing or shortness of breath. Swelling of the face or feeling warm (flushed). Fever or chills. Head, chest, or back pain. Dark pee (urine) or blood in the pee. Widespread rash. Fast  heartbeat. Feeling dizzy or light-headed. You may receive your blood transfusion in an outpatient setting. If so, you will be told whom to contact to report any reactions. These symptoms may be an emergency. Do not wait to see if the symptoms will go away. Get medical help right away. Call your local emergency services (911 in the [place] Do not drive yourself to the hospital. Summary Bruising and soreness at the IV site are common. Check your insertion site every day for signs of infection. Rest as told by your doctor. Go back to your normal activities as told by your doctor. Get help right away if you have signs of a serious reaction. This information is not intended to replace advice given to you by your health care provider. Make sure you discuss any questions you have with your health care provider. Document Revised: 10/17/2020 Document Reviewed: 12/15/2018 Elsevier Patient Education  2022 Elsevier Inc.  

## 2021-03-21 LAB — TYPE AND SCREEN
ABO/RH(D): B POS
Antibody Screen: NEGATIVE
Unit division: 0
Unit division: 0

## 2021-03-21 LAB — BPAM RBC
Blood Product Expiration Date: 202210092359
Blood Product Expiration Date: 202210092359
ISSUE DATE / TIME: 202209150735
ISSUE DATE / TIME: 202209150735
Unit Type and Rh: 7300
Unit Type and Rh: 7300

## 2021-03-24 MED ORDER — OXYCODONE-ACETAMINOPHEN 5-325 MG PO TABS: 1.0000 | ORAL_TABLET | ORAL | 0 refills | Status: DC | PRN

## 2021-03-24 MED ORDER — PREDNISONE 20 MG PO TABS: 40.0000 mg | ORAL_TABLET | Freq: Every day | ORAL | 0 refills | Status: DC

## 2021-04-04 ENCOUNTER — Other Ambulatory Visit: Payer: Self-pay | Admitting: Orthopedic Surgery

## 2021-04-04 DIAGNOSIS — M1612 Unilateral primary osteoarthritis, left hip: Secondary | ICD-10-CM

## 2021-04-07 MED ORDER — PREDNISONE 20 MG PO TABS: 40.0000 mg | ORAL_TABLET | Freq: Every day | ORAL | 0 refills | Status: DC

## 2021-04-07 MED ORDER — OXYCODONE-ACETAMINOPHEN 5-325 MG PO TABS: 1.0000 | ORAL_TABLET | ORAL | 0 refills | Status: DC | PRN

## 2021-04-10 ENCOUNTER — Inpatient Hospital Stay: Admission: RE | Admit: 2021-04-10 | Payer: Medicare Other | Source: Ambulatory Visit

## 2021-04-21 ENCOUNTER — Other Ambulatory Visit: Payer: Self-pay | Admitting: Internal Medicine

## 2021-05-05 ENCOUNTER — Other Ambulatory Visit: Payer: Self-pay | Admitting: Internal Medicine

## 2021-06-10 ENCOUNTER — Other Ambulatory Visit: Payer: Self-pay | Admitting: Internal Medicine

## 2021-06-10 MED ORDER — HYDROXYZINE HCL 10 MG PO TABS: 10.0000 mg | ORAL_TABLET | Freq: Three times a day (TID) | ORAL | 0 refills | Status: DC | PRN

## 2021-06-16 ENCOUNTER — Encounter: Payer: Self-pay | Admitting: Hematology and Oncology

## 2021-06-16 ENCOUNTER — Encounter: Payer: Self-pay | Admitting: Internal Medicine

## 2021-06-17 ENCOUNTER — Other Ambulatory Visit: Payer: Self-pay

## 2021-06-17 ENCOUNTER — Inpatient Hospital Stay: Payer: Medicare Other | Attending: Hematology and Oncology

## 2021-06-17 ENCOUNTER — Telehealth: Payer: Self-pay

## 2021-06-17 ENCOUNTER — Telehealth: Payer: Self-pay | Admitting: Hematology and Oncology

## 2021-06-17 DIAGNOSIS — Z79899 Other long term (current) drug therapy: Secondary | ICD-10-CM | POA: Diagnosis not present

## 2021-06-17 DIAGNOSIS — K922 Gastrointestinal hemorrhage, unspecified: Secondary | ICD-10-CM | POA: Insufficient documentation

## 2021-06-17 DIAGNOSIS — D500 Iron deficiency anemia secondary to blood loss (chronic): Secondary | ICD-10-CM

## 2021-06-17 LAB — CBC WITH DIFFERENTIAL (CANCER CENTER ONLY)
Abs Immature Granulocytes: 0.02 10*3/uL (ref 0.00–0.07)
Basophils Absolute: 0.1 10*3/uL (ref 0.0–0.1)
Basophils Relative: 1 %
Eosinophils Absolute: 0.7 10*3/uL — ABNORMAL HIGH (ref 0.0–0.5)
Eosinophils Relative: 11 %
HCT: 32.1 % — ABNORMAL LOW (ref 36.0–46.0)
Hemoglobin: 9.4 g/dL — ABNORMAL LOW (ref 12.0–15.0)
Immature Granulocytes: 0 %
Lymphocytes Relative: 21 %
Lymphs Abs: 1.3 10*3/uL (ref 0.7–4.0)
MCH: 23.4 pg — ABNORMAL LOW (ref 26.0–34.0)
MCHC: 29.3 g/dL — ABNORMAL LOW (ref 30.0–36.0)
MCV: 80 fL (ref 80.0–100.0)
Monocytes Absolute: 0.6 10*3/uL (ref 0.1–1.0)
Monocytes Relative: 9 %
Neutro Abs: 3.7 10*3/uL (ref 1.7–7.7)
Neutrophils Relative %: 58 %
Platelet Count: 276 10*3/uL (ref 150–400)
RBC: 4.01 MIL/uL (ref 3.87–5.11)
RDW: 17.8 % — ABNORMAL HIGH (ref 11.5–15.5)
WBC Count: 6.4 10*3/uL (ref 4.0–10.5)
nRBC: 0 % (ref 0.0–0.2)

## 2021-06-17 LAB — SAMPLE TO BLOOD BANK

## 2021-06-17 NOTE — Telephone Encounter (Signed)
Called pt's daughter, [name] to let her know pt's hgb is 9.4 and pt will not need blood transfusion 12/14 and we will f/u with labs next month, and to call [facility] with any further concerns.

## 2021-06-17 NOTE — Telephone Encounter (Signed)
Received Mychart message from pt's daughter, per MD pt should come in for labs and schedule for blood transfusion  if needed. MD would also like for pt to have monthly labs and f/u visit in 3 mos. Message sent to scheduling to arrange. Pt's daughter will bring pt today for labs and knows to expect call from scheduling.

## 2021-06-17 NOTE — Telephone Encounter (Signed)
Scheduled per sch msg. Called and spoke with patients daughter. Confirmed all appts

## 2021-06-18 ENCOUNTER — Inpatient Hospital Stay: Payer: Medicare Other

## 2021-06-18 LAB — IRON AND TIBC
Iron: 36 ug/dL — ABNORMAL LOW (ref 41–142)
Saturation Ratios: 9 % — ABNORMAL LOW (ref 21–57)
TIBC: 398 ug/dL (ref 236–444)
UIBC: 362 ug/dL (ref 120–384)

## 2021-06-18 LAB — FERRITIN: Ferritin: 10 ng/mL — ABNORMAL LOW (ref 11–307)

## 2021-06-19 ENCOUNTER — Telehealth: Payer: Self-pay | Admitting: Hematology and Oncology

## 2021-06-19 ENCOUNTER — Other Ambulatory Visit: Payer: Self-pay

## 2021-06-19 ENCOUNTER — Encounter: Payer: Self-pay | Admitting: Internal Medicine

## 2021-06-19 ENCOUNTER — Ambulatory Visit (INDEPENDENT_AMBULATORY_CARE_PROVIDER_SITE_OTHER): Payer: Medicare Other | Admitting: Internal Medicine

## 2021-06-19 DIAGNOSIS — M1000 Idiopathic gout, unspecified site: Secondary | ICD-10-CM | POA: Diagnosis not present

## 2021-06-19 DIAGNOSIS — I5032 Chronic diastolic (congestive) heart failure: Secondary | ICD-10-CM | POA: Diagnosis not present

## 2021-06-19 DIAGNOSIS — I2699 Other pulmonary embolism without acute cor pulmonale: Secondary | ICD-10-CM

## 2021-06-19 DIAGNOSIS — R1030 Lower abdominal pain, unspecified: Secondary | ICD-10-CM | POA: Diagnosis not present

## 2021-06-19 MED ORDER — PREDNISONE 20 MG PO TABS
40.0000 mg | ORAL_TABLET | Freq: Every day | ORAL | 0 refills | Status: DC
Start: 2021-06-19 — End: 2021-07-24

## 2021-06-19 MED ORDER — OXYCODONE-ACETAMINOPHEN 5-325 MG PO TABS: 1.0000 | ORAL_TABLET | ORAL | 0 refills | Status: DC | PRN

## 2021-06-19 MED ORDER — TRAMADOL HCL 50 MG PO TABS: 50.0000 mg | ORAL_TABLET | Freq: Three times a day (TID) | ORAL | 0 refills | Status: DC | PRN

## 2021-06-19 NOTE — Patient Instructions (Signed)
Please stop the triamterene/HCTZ!! This can cause your gout to be worse.

## 2021-06-19 NOTE — Telephone Encounter (Signed)
I discussed patient's labs with her daughter [name]. Her hemoglobin is impressive at 9.4 and ferritin is at 10. I instructed them to continue what they are doing with oral iron replacement and come monthly for labs and March for a follow-up with me.

## 2021-06-19 NOTE — Assessment & Plan Note (Signed)
She remains reluctant to use the torsemide---urged her to use it more frequently

## 2021-06-19 NOTE — Assessment & Plan Note (Signed)
Seems more inguinal She doesn't lift and doesn't remember any straining Reassured--doesn't sound like it was diverticulitis Observe only--since she is better

## 2021-06-19 NOTE — Progress Notes (Signed)
Subjective:    Patient ID: Chelsea Keller, female    DOB: 01/18/1935, 86 y.o.   MRN: [id number]  HPI Here with daughter due to abdominal pain  Was getting some severe pains along both lower flanks Also by the umbilicus Started about 3 weeks ago---but then improved for the past week Not related to eating that she could tell  Bowels moving regularly---relates to the colchicine No dysuria No fever Has nausea fairly frequently---"I don't eat right"  She feels her hand pain is worse Is on the allopurinol Didn't stop the HCTZ!! Discussed this  Current Outpatient Medications on File Prior to Visit  Medication Sig Dispense Refill   acetaminophen (TYLENOL) 500 MG tablet Take 1,000-1,500 mg by mouth every 6 (six) hours as needed for moderate pain or headache (pain).      allopurinol (ZYLOPRIM) 100 MG tablet Take 1 tablet (100 mg total) by mouth daily. 30 tablet 6   allopurinol (ZYLOPRIM) 300 MG tablet Take 1 tablet (300 mg total) by mouth daily. 30 tablet 6   colchicine 0.6 MG tablet Take 1 tablet (0.6 mg total) by mouth 2 (two) times daily. 60 tablet 11   Crisaborole (EUCRISA) 2 % OINT Apply to skin daily 60 g 4   ELIQUIS 2.5 MG TABS tablet TAKE 1 TABLET TWICE A DAY 180 tablet 3   fluconazole (DIFLUCAN) 150 MG tablet Take 150 mg by mouth once.     furosemide (LASIX) 80 MG tablet      gabapentin (NEURONTIN) 300 MG capsule Take 1 capsule in the morning and 2 capsules at bedtime 270 capsule 3   hydrOXYzine (ATARAX) 10 MG tablet Take 1 tablet (10 mg total) by mouth 3 (three) times daily as needed. 90 tablet 0   KLOR-CON M20 20 MEQ tablet TAKE 2 TABLETS (40 MEQ TOTAL) DAILY 180 tablet 3   ondansetron (ZOFRAN) 4 MG tablet Take 1 tablet (4 mg total) by mouth every 8 (eight) hours as needed for nausea or vomiting. 20 tablet 0   oxyCODONE-acetaminophen (PERCOCET/ROXICET) 5-325 MG tablet Take 1 tablet by mouth every 4 (four) hours as needed for severe pain. 20 tablet 0   tiZANidine (ZANAFLEX) 2 MG  tablet      torsemide (DEMADEX) 20 MG tablet Take 20 mg by mouth as needed.     traMADol (ULTRAM) 50 MG tablet Take 1 tablet (50 mg total) by mouth 3 (three) times daily as needed for moderate pain. 60 tablet 0   predniSONE (DELTASONE) 20 MG tablet Take 2 tablets (40 mg total) by mouth daily. For 5 days, then 1 daily for 5 days (Patient not taking: Reported on 06/19/2021) 15 tablet 0   No current facility-administered medications on file prior to visit.    Allergies  Allergen Reactions   Codeine Sulfate Shortness Of Breath   Celecoxib Other (See Comments)    Caused vaginal bleeding   Erythromycin Base Nausea And Vomiting   Spironolactone Nausea Only and Other (See Comments)    Blurred vision, Upset stomach, lethargy    Past Medical History:  Diagnosis Date   Allergy    Anemia    Anxiety    Arthritis    Arthritis of sacroiliac joint of both sides 11/12/2017   Bilateral pulmonary embolism (HCC) 06/23/2017   Chronic diastolic CHF (congestive heart failure) ([place])    a. Echo 1/16:  mild LVH, EF normal, grade 1 DD, MAC   Chronic venous insufficiency    chronic LE edema   DDD (degenerative disc disease),  lumbar 11/12/2017   Degenerative joint disease (DJD) of hip, Bilateral  11/12/2017   Depression    Fibromyalgia    constant pain   Hx of cardiac catheterization    a. LHC in [place] "ok" per patient with mild plaque in a single vessel - records not available   Hx of cardiovascular stress test    a. Nuclear study in 2008 normal   Hx of colonic polyps    Hypertension    Hypertriglyceridemia    Impaired fasting glucose    PONV (postoperative nausea and vomiting)    PUD (peptic ulcer disease)    hx of gastric ulcer   Pulmonary emboli ([place]) 06/2017   Vitamin B12 deficiency     Past Surgical History:  Procedure Laterality Date   ABDOMINAL HYSTERECTOMY     CATARACT EXTRACTION  10/2003   OD   CHOLECYSTECTOMY     COLONOSCOPY W/ POLYPECTOMY     COLONOSCOPY WITH PROPOFOL N/A 10/15/2014    Procedure: COLONOSCOPY WITH PROPOFOL;  Surgeon: Ladene Artist, MD;  [facility];  Service: Endoscopy;  Laterality: N/A;   COLONOSCOPY WITH PROPOFOL N/A 03/23/2018   Procedure: COLONOSCOPY WITH PROPOFOL;  Surgeon: Gatha Mayer, MD;  Location: [facility] ENDOSCOPY;  Service: Endoscopy;  Laterality: N/A;   ESOPHAGOGASTRODUODENOSCOPY (EGD) WITH PROPOFOL N/A 10/15/2014   Procedure: ESOPHAGOGASTRODUODENOSCOPY (EGD) WITH PROPOFOL;  Surgeon: Ladene Artist, MD;  [facility];  Service: Endoscopy;  Laterality: N/A;   EXTERNAL FIXATION ANKLE FRACTURE     Fx.  left ankle-fixation with pins later removed sec to infection [place]   left with pins   EYE SURGERY     FEMUR FRACTURE SURGERY  06/2009   FRACTURE SURGERY     HARDWARE REMOVAL Left 10/05/2013   Procedure: HARDWARE REMOVAL LEFT DISTAL FEMUR;  Surgeon: Rozanna Box, MD;  Location: [place];  Service: Orthopedics;  Laterality: Left;   HEMIARTHROPLASTY SHOULDER FRACTURE  06/2009   HOT HEMOSTASIS N/A 03/23/2018   Procedure: HOT HEMOSTASIS (ARGON PLASMA COAGULATION/BICAP);  Surgeon: Gatha Mayer, MD;  Location: [facility] ENDOSCOPY;  Service: Endoscopy;  Laterality: N/A;   JOINT REPLACEMENT     STERIOD INJECTION Right 10/05/2013   Procedure: STEROID INJECTION;  Surgeon: Rozanna Box, MD;  Location: [place];  Service: Orthopedics;  Laterality: Right;   TONSILLECTOMY     TONSILLECTOMY     TOTAL KNEE ARTHROPLASTY  03/09   left    Family History  Problem Relation Age of Onset   Depression Mother    Cancer Mother        uterine cancer   Heart attack Father    Cancer Brother        prostate cancer   Diabetes Maternal Aunt    Arthritis Brother    Asthma Brother    Stroke Maternal Grandmother    Pulmonary embolism Daughter     Social History   Socioeconomic History   Marital status: Widowed    Spouse name: Not on file   Number of children: 4   Years of education: Not on file   Highest  education level: Not on file  Occupational History   Occupation: retired crossing guard   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  Tobacco Use   Smoking status: Former    Packs/day: 1.00    Years: 40.00    Pack years: 40.00    Types: Cigarettes    Quit date: 07/06/1993  Years since quitting: 27.9   Smokeless tobacco: Never  Vaping Use   Vaping Use: Never used  Substance and Sexual Activity   Alcohol use: No    Alcohol/week: 0.0 standard drinks   Drug use: No   Sexual activity: Not Currently  Other Topics Concern   Not on file  Social History Narrative   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 [employer], lives w/ daughter and adult grandsons   Former smoker, no EtOH      No living will   Plans to do health care POA forms---wants daughter [name]   Would accept resuscitation attempts---no prolonged ventilation   Absolutely no feeding tube   Social Determinants of Health   Financial Resource Strain: Not on file  Food Insecurity: Not on file  Transportation Needs: Not on file  Physical Activity: Not on file  Stress: Not on file  Social Connections: Not on file  Intimate Partner Violence: Not on file   Review of Systems Hasn't taken the torsemide lately Edema is stable No SOB Does have chronic cough (2 years) and wheezing    Objective:   Physical Exam Constitutional:      Appearance: Normal appearance.  Cardiovascular:     Rate and Rhythm: Normal rate and regular rhythm.     Heart sounds: No murmur heard.   No gallop.  Pulmonary:     Effort: Pulmonary effort is normal.     Breath sounds: Normal breath sounds. No wheezing or rales.  Abdominal:     Palpations: Abdomen is soft.     Tenderness: There is no abdominal tenderness.     Comments: Pain area is along inguinal ligaments bilaterally ----no hernia  Musculoskeletal:     Cervical back: Neck supple.     Comments: Thick calves but not pitting  Lymphadenopathy:     Cervical: No  cervical adenopathy.  Neurological:     Mental Status: She is alert.  Psychiatric:        Mood and Affect: Mood normal.        Behavior: Behavior normal.           Assessment & Plan:

## 2021-06-19 NOTE — Assessment & Plan Note (Signed)
Somehow didn't stop the HCTZ---urged her to stop it now Is on the allopurinol Colchicine prn (limited due to diarrhea)

## 2021-06-19 NOTE — Assessment & Plan Note (Signed)
Continues on eliquis bid No chest pain or SOB

## 2021-07-06 ENCOUNTER — Other Ambulatory Visit: Payer: Self-pay | Admitting: Internal Medicine

## 2021-07-15 ENCOUNTER — Other Ambulatory Visit: Payer: Self-pay

## 2021-07-15 DIAGNOSIS — D500 Iron deficiency anemia secondary to blood loss (chronic): Secondary | ICD-10-CM

## 2021-07-16 ENCOUNTER — Other Ambulatory Visit: Payer: Self-pay

## 2021-07-16 ENCOUNTER — Inpatient Hospital Stay: Payer: Medicare Other | Attending: Hematology and Oncology

## 2021-07-16 DIAGNOSIS — D500 Iron deficiency anemia secondary to blood loss (chronic): Secondary | ICD-10-CM | POA: Insufficient documentation

## 2021-07-16 LAB — CBC WITH DIFFERENTIAL (CANCER CENTER ONLY)
Abs Immature Granulocytes: 0.02 10*3/uL (ref 0.00–0.07)
Basophils Absolute: 0.1 10*3/uL (ref 0.0–0.1)
Basophils Relative: 1 %
Eosinophils Absolute: 1.5 10*3/uL — ABNORMAL HIGH (ref 0.0–0.5)
Eosinophils Relative: 20 %
HCT: 29.7 % — ABNORMAL LOW (ref 36.0–46.0)
Hemoglobin: 8.6 g/dL — ABNORMAL LOW (ref 12.0–15.0)
Immature Granulocytes: 0 %
Lymphocytes Relative: 14 %
Lymphs Abs: 1 10*3/uL (ref 0.7–4.0)
MCH: 23.1 pg — ABNORMAL LOW (ref 26.0–34.0)
MCHC: 29 g/dL — ABNORMAL LOW (ref 30.0–36.0)
MCV: 79.8 fL — ABNORMAL LOW (ref 80.0–100.0)
Monocytes Absolute: 0.7 10*3/uL (ref 0.1–1.0)
Monocytes Relative: 9 %
Neutro Abs: 4.2 10*3/uL (ref 1.7–7.7)
Neutrophils Relative %: 56 %
Platelet Count: 306 10*3/uL (ref 150–400)
RBC: 3.72 MIL/uL — ABNORMAL LOW (ref 3.87–5.11)
RDW: 17.4 % — ABNORMAL HIGH (ref 11.5–15.5)
WBC Count: 7.5 10*3/uL (ref 4.0–10.5)
nRBC: 0 % (ref 0.0–0.2)

## 2021-07-16 LAB — SAMPLE TO BLOOD BANK

## 2021-07-17 ENCOUNTER — Other Ambulatory Visit: Payer: Self-pay | Admitting: Hematology and Oncology

## 2021-07-17 ENCOUNTER — Other Ambulatory Visit: Payer: Self-pay | Admitting: *Deleted

## 2021-07-17 ENCOUNTER — Encounter: Payer: Self-pay | Admitting: Hematology and Oncology

## 2021-07-17 DIAGNOSIS — D649 Anemia, unspecified: Secondary | ICD-10-CM

## 2021-07-17 LAB — IRON AND IRON BINDING CAPACITY (CC-WL,HP ONLY)
Iron: 23 ug/dL — ABNORMAL LOW (ref 28–170)
Saturation Ratios: 5 % — ABNORMAL LOW (ref 10.4–31.8)
TIBC: 448 ug/dL (ref 250–450)
UIBC: 425 ug/dL (ref 148–442)

## 2021-07-17 LAB — FERRITIN: Ferritin: 14 ng/mL (ref 11–307)

## 2021-07-17 NOTE — Progress Notes (Signed)
Pt hgb 8.3 and symptomatic. Per MD pt needing to receive 1 unit PRBC's.  Orders placed.  Pt daughter notified.

## 2021-07-18 ENCOUNTER — Other Ambulatory Visit: Payer: Self-pay

## 2021-07-18 ENCOUNTER — Inpatient Hospital Stay: Payer: Medicare Other

## 2021-07-18 DIAGNOSIS — D649 Anemia, unspecified: Secondary | ICD-10-CM

## 2021-07-18 DIAGNOSIS — D500 Iron deficiency anemia secondary to blood loss (chronic): Secondary | ICD-10-CM | POA: Diagnosis not present

## 2021-07-18 LAB — PREPARE RBC (CROSSMATCH)

## 2021-07-18 MED ORDER — ACETAMINOPHEN 325 MG PO TABS
650.0000 mg | ORAL_TABLET | Freq: Once | ORAL | Status: AC
  Administered 2021-07-18: 650 mg via ORAL
  Filled 2021-07-18: qty 2

## 2021-07-18 MED ORDER — DIPHENHYDRAMINE HCL 25 MG PO CAPS
25.0000 mg | ORAL_CAPSULE | Freq: Once | ORAL | Status: AC
  Administered 2021-07-18: 25 mg via ORAL
  Filled 2021-07-18: qty 1

## 2021-07-18 MED ORDER — SODIUM CHLORIDE 0.9% IV SOLUTION
250.0000 mL | Freq: Once | INTRAVENOUS | Status: AC
  Administered 2021-07-18: 250 mL via INTRAVENOUS

## 2021-07-18 NOTE — Patient Instructions (Signed)
Blood Transfusion, Adult, Care After This sheet gives you information about how to care for yourself after your procedure. Your doctor may also give you more specific instructions. If you have problems or questions, contact your doctor. What can I expect after the procedure? After the procedure, it is common to have: Bruising and soreness at the IV site. A headache. Follow these instructions at home: Insertion site care   Follow instructions from your doctor about how to take care of your insertion site. This is where an IV tube was put into your vein. Make sure you: Wash your hands with soap and water before and after you change your bandage (dressing). If you cannot use soap and water, use hand sanitizer. Change your bandage as told by your doctor. Check your insertion site every day for signs of infection. Check for: Redness, swelling, or pain. Bleeding from the site. Warmth. Pus or a bad smell. General instructions Take over-the-counter and prescription medicines only as told by your doctor. Rest as told by your doctor. Go back to your normal activities as told by your doctor. Keep all follow-up visits as told by your doctor. This is important. Contact a doctor if: You have itching or red, swollen areas of skin (hives). You feel worried or nervous (anxious). You feel weak after doing your normal activities. You have redness, swelling, warmth, or pain around the insertion site. You have blood coming from the insertion site, and the blood does not stop with pressure. You have pus or a bad smell coming from the insertion site. Get help right away if: You have signs of a serious reaction. This may be coming from an allergy or the body's defense system (immune system). Signs include: Trouble breathing or shortness of breath. Swelling of the face or feeling warm (flushed). Fever or chills. Head, chest, or back pain. Dark pee (urine) or blood in the pee. Widespread rash. Fast  heartbeat. Feeling dizzy or light-headed. You may receive your blood transfusion in an outpatient setting. If so, you will be told whom to contact to report any reactions. These symptoms may be an emergency. Do not wait to see if the symptoms will go away. Get medical help right away. Call your local emergency services (911 in the [place] Do not drive yourself to the hospital. Summary Bruising and soreness at the IV site are common. Check your insertion site every day for signs of infection. Rest as told by your doctor. Go back to your normal activities as told by your doctor. Get help right away if you have signs of a serious reaction. This information is not intended to replace advice given to you by your health care provider. Make sure you discuss any questions you have with your health care provider. Document Revised: 10/17/2020 Document Reviewed: 12/15/2018 Elsevier Patient Education  2022 Elsevier Inc.  

## 2021-07-21 LAB — TYPE AND SCREEN
ABO/RH(D): B POS
Antibody Screen: NEGATIVE
Unit division: 0

## 2021-07-21 LAB — BPAM RBC
Blood Product Expiration Date: 202302062359
ISSUE DATE / TIME: 202301131355
Unit Type and Rh: 7300

## 2021-07-22 ENCOUNTER — Encounter: Payer: Self-pay | Admitting: Internal Medicine

## 2021-07-22 ENCOUNTER — Telehealth: Payer: Self-pay

## 2021-07-22 MED ORDER — GABAPENTIN 300 MG PO CAPS: ORAL_CAPSULE | ORAL | 3 refills | Status: DC

## 2021-07-22 NOTE — Telephone Encounter (Signed)
Rx sent electronically.  

## 2021-07-24 ENCOUNTER — Other Ambulatory Visit: Payer: Self-pay

## 2021-07-24 ENCOUNTER — Encounter: Payer: Self-pay | Admitting: Internal Medicine

## 2021-07-24 ENCOUNTER — Ambulatory Visit (INDEPENDENT_AMBULATORY_CARE_PROVIDER_SITE_OTHER): Payer: Medicare Other | Admitting: Internal Medicine

## 2021-07-24 DIAGNOSIS — M109 Gout, unspecified: Secondary | ICD-10-CM | POA: Diagnosis not present

## 2021-07-24 MED ORDER — PREDNISONE 20 MG PO TABS: 40.0000 mg | ORAL_TABLET | Freq: Every day | ORAL | 1 refills | Status: DC

## 2021-07-24 NOTE — Progress Notes (Signed)
Subjective:    Patient ID: Chelsea Keller, female    DOB: [birth year], 87 y.o.   MRN: [id number]  HPI Here with daughter due to right foot pain and neck pain  Having pain along the lateral right foot Very tender Also along the top of the foot (near proximal ends of metatarsals) Colchicine not helping Didn't try prednisone for this  Still with swelling along right neck---supraclavicular Now feels it could be both sides Had pain yesterday--but better today  Has a rash on arms for a month (right arm) and 2 weeks for left arm Very itchy No fever  Current Outpatient Medications on File Prior to Visit  Medication Sig Dispense Refill   acetaminophen (TYLENOL) 500 MG tablet Take 1,000-1,500 mg by mouth every 6 (six) hours as needed for moderate pain or headache (pain).      allopurinol (ZYLOPRIM) 100 MG tablet Take 1 tablet (100 mg total) by mouth daily. 30 tablet 6   allopurinol (ZYLOPRIM) 300 MG tablet Take 1 tablet (300 mg total) by mouth daily. 30 tablet 6   colchicine 0.6 MG tablet Take 1 tablet (0.6 mg total) by mouth 2 (two) times daily. 60 tablet 11   Crisaborole (EUCRISA) 2 % OINT Apply to skin daily 60 g 4   ELIQUIS 2.5 MG TABS tablet TAKE 1 TABLET TWICE A DAY 180 tablet 3   fluconazole (DIFLUCAN) 150 MG tablet Take 150 mg by mouth once.     furosemide (LASIX) 80 MG tablet      gabapentin (NEURONTIN) 300 MG capsule Take 1 capsule in the morning and 2 capsules at bedtime 270 capsule 3   hydrOXYzine (ATARAX) 10 MG tablet Take 1 tablet (10 mg total) by mouth 3 (three) times daily as needed. 90 tablet 0   KLOR-CON M20 20 MEQ tablet TAKE 2 TABLETS (40 MEQ TOTAL) DAILY 180 tablet 3   ondansetron (ZOFRAN) 4 MG tablet Take 1 tablet (4 mg total) by mouth every 8 (eight) hours as needed for nausea or vomiting. 20 tablet 0   oxyCODONE-acetaminophen (PERCOCET/ROXICET) 5-325 MG tablet Take 1 tablet by mouth every 4 (four) hours as needed for severe pain. 20 tablet 0   tiZANidine (ZANAFLEX) 2  MG tablet      torsemide (DEMADEX) 20 MG tablet Take 20 mg by mouth as needed.     traMADol (ULTRAM) 50 MG tablet Take 1 tablet (50 mg total) by mouth 3 (three) times daily as needed for moderate pain. 60 tablet 0   No current facility-administered medications on file prior to visit.    Allergies  Allergen Reactions   Codeine Sulfate Shortness Of Breath   Celecoxib Other (See Comments)    Caused vaginal bleeding   Erythromycin Base Nausea And Vomiting   Spironolactone Nausea Only and Other (See Comments)    Blurred vision, Upset stomach, lethargy    Past Medical History:  Diagnosis Date   Allergy    Anemia    Anxiety    Arthritis    Arthritis of sacroiliac joint of both sides 11/12/2017   Bilateral pulmonary embolism (HCC) 06/23/2017   Chronic diastolic CHF (congestive heart failure) ([place])    a. Echo 1/16:  mild LVH, EF normal, grade 1 DD, MAC   Chronic venous insufficiency    chronic LE edema   DDD (degenerative disc disease), lumbar 11/12/2017   Degenerative joint disease (DJD) of hip, Bilateral  11/12/2017   Depression    Fibromyalgia    constant pain   Hx of  cardiac catheterization    a. LHC in [place] "ok" per patient with mild plaque in a single vessel - records not available   Hx of cardiovascular stress test    a. Nuclear study in 2008 normal   Hx of colonic polyps    Hypertension    Hypertriglyceridemia    Impaired fasting glucose    PONV (postoperative nausea and vomiting)    PUD (peptic ulcer disease)    hx of gastric ulcer   Pulmonary emboli ([place]) 06/2017   Vitamin B12 deficiency     Past Surgical History:  Procedure Laterality Date   ABDOMINAL HYSTERECTOMY     CATARACT EXTRACTION  10/2003   OD   CHOLECYSTECTOMY     COLONOSCOPY W/ POLYPECTOMY     COLONOSCOPY WITH PROPOFOL N/A 10/15/2014   Procedure: COLONOSCOPY WITH PROPOFOL;  Surgeon: Ladene Artist, MD;  [facility];  Service: Endoscopy;  Laterality: N/A;   COLONOSCOPY WITH PROPOFOL N/A  03/23/2018   Procedure: COLONOSCOPY WITH PROPOFOL;  Surgeon: Gatha Mayer, MD;  Location: [facility] ENDOSCOPY;  Service: Endoscopy;  Laterality: N/A;   ESOPHAGOGASTRODUODENOSCOPY (EGD) WITH PROPOFOL N/A 10/15/2014   Procedure: ESOPHAGOGASTRODUODENOSCOPY (EGD) WITH PROPOFOL;  Surgeon: Ladene Artist, MD;  [facility];  Service: Endoscopy;  Laterality: N/A;   EXTERNAL FIXATION ANKLE FRACTURE     Fx.  left ankle-fixation with pins later removed sec to infection [place]   left with pins   EYE SURGERY     FEMUR FRACTURE SURGERY  06/2009   FRACTURE SURGERY     HARDWARE REMOVAL Left 10/05/2013   Procedure: HARDWARE REMOVAL LEFT DISTAL FEMUR;  Surgeon: Rozanna Box, MD;  Location: [place];  Service: Orthopedics;  Laterality: Left;   HEMIARTHROPLASTY SHOULDER FRACTURE  06/2009   HOT HEMOSTASIS N/A 03/23/2018   Procedure: HOT HEMOSTASIS (ARGON PLASMA COAGULATION/BICAP);  Surgeon: Gatha Mayer, MD;  [facility] ENDOSCOPY;  Service: Endoscopy;  Laterality: N/A;   JOINT REPLACEMENT     STERIOD INJECTION Right 10/05/2013   Procedure: STEROID INJECTION;  Surgeon: Rozanna Box, MD;  Location: [place];  Service: Orthopedics;  Laterality: Right;   TONSILLECTOMY     TONSILLECTOMY     TOTAL KNEE ARTHROPLASTY  03/09   left    Family History  Problem Relation Age of Onset   Depression Mother    Cancer Mother        uterine cancer   Heart attack Father    Cancer Brother        prostate cancer   Diabetes Maternal Aunt    Arthritis Brother    Asthma Brother    Stroke Maternal Grandmother    Pulmonary embolism Daughter     Social History   Socioeconomic History   Marital status: Widowed    Spouse name: Not on file   Number of children: 4   Years of education: Not on file   Highest education level: Not on file  Occupational History   Occupation: retired crossing guard   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  Tobacco Use   Smoking status:  Former    Packs/day: 1.00    Years: 40.00    Pack years: 40.00    Types: Cigarettes    Quit date: 07/06/1993    Years since quitting: 28.0   Smokeless tobacco: Never  Vaping Use   Vaping Use: Never used  Substance and Sexual Activity   Alcohol use: No  Alcohol/week: 0.0 standard drinks   Drug use: No   Sexual activity: Not Currently  Other Topics Concern   Not on file  Social History Narrative   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[employer], lives w/ daughter and adult grandsons   Former smoker, no EtOH      No living will   Plans to do health care POA forms---wants daughter [name]   Would accept resuscitation attempts---no prolonged ventilation   Absolutely no feeding tube   Social Determinants of Health   Financial Resource Strain: Not on file  Food Insecurity: Not on file  Transportation Needs: Not on file  Physical Activity: Not on file  Stress: Not on file  Social Connections: Not on file  Intimate Partner Violence: Not on file   Review of Systems Has had some leaking from right leg--fluid    Objective:   Physical Exam Neck:     Comments: Fatty tissue over proximal clavicles ROM is pretty good No sig tenderness Musculoskeletal:     Comments: Redness and tenderness along mid dorsum of right foot---tender MTP and ankles are quiet  Skin:    Comments: Diffuse redness along proximal extensor forearms with some excoriations  Neurological:     Mental Status: She is alert.           Assessment & Plan:

## 2021-07-24 NOTE — Assessment & Plan Note (Signed)
Foot inflammation seems to be gout Will have her use prednisone 40 x 5 days, then 20 x 5 days Should help the rash also (neuroderm?)

## 2021-08-14 ENCOUNTER — Encounter: Payer: Self-pay | Admitting: Hematology and Oncology

## 2021-08-14 ENCOUNTER — Other Ambulatory Visit: Payer: Self-pay

## 2021-08-14 ENCOUNTER — Encounter: Payer: Self-pay | Admitting: Internal Medicine

## 2021-08-14 ENCOUNTER — Other Ambulatory Visit: Payer: Self-pay | Admitting: *Deleted

## 2021-08-14 ENCOUNTER — Inpatient Hospital Stay: Payer: Medicare Other | Attending: Hematology and Oncology

## 2021-08-14 DIAGNOSIS — D649 Anemia, unspecified: Secondary | ICD-10-CM

## 2021-08-14 DIAGNOSIS — D508 Other iron deficiency anemias: Secondary | ICD-10-CM | POA: Diagnosis not present

## 2021-08-14 LAB — CBC WITH DIFFERENTIAL (CANCER CENTER ONLY)
Abs Immature Granulocytes: 0.07 10*3/uL (ref 0.00–0.07)
Basophils Absolute: 0 10*3/uL (ref 0.0–0.1)
Basophils Relative: 0 %
Eosinophils Absolute: 0.4 10*3/uL (ref 0.0–0.5)
Eosinophils Relative: 4 %
HCT: 34.8 % — ABNORMAL LOW (ref 36.0–46.0)
Hemoglobin: 10.5 g/dL — ABNORMAL LOW (ref 12.0–15.0)
Immature Granulocytes: 1 %
Lymphocytes Relative: 12 %
Lymphs Abs: 1.3 10*3/uL (ref 0.7–4.0)
MCH: 23.6 pg — ABNORMAL LOW (ref 26.0–34.0)
MCHC: 30.2 g/dL (ref 30.0–36.0)
MCV: 78.4 fL — ABNORMAL LOW (ref 80.0–100.0)
Monocytes Absolute: 0.8 10*3/uL (ref 0.1–1.0)
Monocytes Relative: 8 %
Neutro Abs: 7.8 10*3/uL — ABNORMAL HIGH (ref 1.7–7.7)
Neutrophils Relative %: 75 %
Platelet Count: 302 10*3/uL (ref 150–400)
RBC: 4.44 MIL/uL (ref 3.87–5.11)
RDW: 18.5 % — ABNORMAL HIGH (ref 11.5–15.5)
WBC Count: 10.4 10*3/uL (ref 4.0–10.5)
nRBC: 0 % (ref 0.0–0.2)

## 2021-08-14 LAB — CMP (CANCER CENTER ONLY)
ALT: 14 U/L (ref 0–44)
AST: 19 U/L (ref 15–41)
Albumin: 3.4 g/dL — ABNORMAL LOW (ref 3.5–5.0)
Alkaline Phosphatase: 106 U/L (ref 38–126)
Anion gap: 7 (ref 5–15)
BUN: 18 mg/dL (ref 8–23)
CO2: 29 mmol/L (ref 22–32)
Calcium: 9 mg/dL (ref 8.9–10.3)
Chloride: 102 mmol/L (ref 98–111)
Creatinine: 1.02 mg/dL — ABNORMAL HIGH (ref 0.44–1.00)
GFR, Estimated: 54 mL/min — ABNORMAL LOW (ref >60)
Glucose, Bld: 165 mg/dL — ABNORMAL HIGH (ref 70–99)
Potassium: 4.2 mmol/L (ref 3.5–5.1)
Sodium: 138 mmol/L (ref 135–145)
Total Bilirubin: 1.2 mg/dL (ref 0.3–1.2)
Total Protein: 5.9 g/dL — ABNORMAL LOW (ref 6.5–8.1)

## 2021-08-14 LAB — SAMPLE TO BLOOD BANK

## 2021-08-14 LAB — IRON AND IRON BINDING CAPACITY (CC-WL,HP ONLY)
Iron: 105 ug/dL (ref 28–170)
Saturation Ratios: 27 % (ref 10.4–31.8)
TIBC: 393 ug/dL (ref 250–450)
UIBC: 288 ug/dL (ref 148–442)

## 2021-08-15 ENCOUNTER — Encounter: Payer: Self-pay | Admitting: *Deleted

## 2021-08-15 LAB — FERRITIN: Ferritin: 20 ng/mL (ref 11–307)

## 2021-08-15 NOTE — Progress Notes (Signed)
Per MD based on pt recent lab work, pt does not meet criteria for blood transfusion.  Pt daughter notified and states pt continues to experience extreme fatigue and would be follow up with PCP today for further evaluation and treatment.

## 2021-08-16 ENCOUNTER — Inpatient Hospital Stay: Payer: Medicare Other

## 2021-08-18 NOTE — Telephone Encounter (Signed)
Made appt tomorrow 945. Spoke to [place].

## 2021-08-19 ENCOUNTER — Other Ambulatory Visit: Payer: Self-pay

## 2021-08-19 ENCOUNTER — Ambulatory Visit (INDEPENDENT_AMBULATORY_CARE_PROVIDER_SITE_OTHER): Payer: Medicare Other | Admitting: Internal Medicine

## 2021-08-19 ENCOUNTER — Other Ambulatory Visit: Payer: Medicare Other

## 2021-08-19 ENCOUNTER — Encounter: Payer: Self-pay | Admitting: Internal Medicine

## 2021-08-19 DIAGNOSIS — I10 Essential (primary) hypertension: Secondary | ICD-10-CM

## 2021-08-19 DIAGNOSIS — I5032 Chronic diastolic (congestive) heart failure: Secondary | ICD-10-CM | POA: Diagnosis not present

## 2021-08-19 DIAGNOSIS — F39 Unspecified mood [affective] disorder: Secondary | ICD-10-CM

## 2021-08-19 DIAGNOSIS — M1000 Idiopathic gout, unspecified site: Secondary | ICD-10-CM

## 2021-08-19 MED ORDER — TRAMADOL HCL 50 MG PO TABS: 50.0000 mg | ORAL_TABLET | Freq: Three times a day (TID) | ORAL | 0 refills | Status: DC | PRN

## 2021-08-19 NOTE — Progress Notes (Signed)
Subjective:    Patient ID: Chelsea Keller, female    DOB: June 24, 1935, 87 y.o.   MRN: [id number]  HPI Here with daughter due to not feeling right  "I want you to tell me what is wrong with me cause I don't know" She feels "like I am detached from my own body" Gets sense that "I just want to drop...that my heart just wants to stop"  Having bad pain in hands---for 6 weeks Did feel great--one day. Then felt bad again She restarted HCTZ/triamterene---only thing she thinks helps her swelling She doesn't think it has affected her gout Continues on allopurinol and colchicine Hasn't needed the torsemide  Last GFR 54  Current Outpatient Medications on File Prior to Visit  Medication Sig Dispense Refill   acetaminophen (TYLENOL) 500 MG tablet Take 1,000-1,500 mg by mouth every 6 (six) hours as needed for moderate pain or headache (pain).      allopurinol (ZYLOPRIM) 100 MG tablet Take 1 tablet (100 mg total) by mouth daily. 30 tablet 6   allopurinol (ZYLOPRIM) 300 MG tablet Take 1 tablet (300 mg total) by mouth daily. 30 tablet 6   colchicine 0.6 MG tablet Take 1 tablet (0.6 mg total) by mouth 2 (two) times daily. 60 tablet 11   Crisaborole (EUCRISA) 2 % OINT Apply to skin daily 60 g 4   ELIQUIS 2.5 MG TABS tablet TAKE 1 TABLET TWICE A DAY 180 tablet 3   fluconazole (DIFLUCAN) 150 MG tablet Take 150 mg by mouth once.     gabapentin (NEURONTIN) 300 MG capsule Take 1 capsule in the morning and 2 capsules at bedtime 270 capsule 3   hydrOXYzine (ATARAX) 10 MG tablet Take 1 tablet (10 mg total) by mouth 3 (three) times daily as needed. 90 tablet 0   KLOR-CON M20 20 MEQ tablet TAKE 2 TABLETS (40 MEQ TOTAL) DAILY 180 tablet 3   ondansetron (ZOFRAN) 4 MG tablet Take 1 tablet (4 mg total) by mouth every 8 (eight) hours as needed for nausea or vomiting. 20 tablet 0   tiZANidine (ZANAFLEX) 2 MG tablet      torsemide (DEMADEX) 20 MG tablet Take 20 mg by mouth as needed.     traMADol (ULTRAM) 50 MG tablet  Take 1 tablet (50 mg total) by mouth 3 (three) times daily as needed for moderate pain. 60 tablet 0   triamterene-hydrochlorothiazide (MAXZIDE-25) 37.5-25 MG tablet Take 1 tablet by mouth daily.     No current facility-administered medications on file prior to visit.    Allergies  Allergen Reactions   Codeine Sulfate Shortness Of Breath   Celecoxib Other (See Comments)    Caused vaginal bleeding   Erythromycin Base Nausea And Vomiting   Spironolactone Nausea Only and Other (See Comments)    Blurred vision, Upset stomach, lethargy    Past Medical History:  Diagnosis Date   Allergy    Anemia    Anxiety    Arthritis    Arthritis of sacroiliac joint of both sides 11/12/2017   Bilateral pulmonary embolism (HCC) 06/23/2017   Chronic diastolic CHF (congestive heart failure) ([place])    a. Echo 1/16:  mild LVH, EF normal, grade 1 DD, MAC   Chronic venous insufficiency    chronic LE edema   DDD (degenerative disc disease), lumbar 11/12/2017   Degenerative joint disease (DJD) of hip, Bilateral  11/12/2017   Depression    Fibromyalgia    constant pain   Hx of cardiac catheterization    a.  LHC in [place] "ok" per patient with mild plaque in a single vessel - records not available   Hx of cardiovascular stress test    a. Nuclear study in 2008 normal   Hx of colonic polyps    Hypertension    Hypertriglyceridemia    Impaired fasting glucose    PONV (postoperative nausea and vomiting)    PUD (peptic ulcer disease)    hx of gastric ulcer   Pulmonary emboli ([place]) 06/2017   Vitamin B12 deficiency     Past Surgical History:  Procedure Laterality Date   ABDOMINAL HYSTERECTOMY     CATARACT EXTRACTION  10/2003   OD   CHOLECYSTECTOMY     COLONOSCOPY W/ POLYPECTOMY     COLONOSCOPY WITH PROPOFOL N/A 10/15/2014   Procedure: COLONOSCOPY WITH PROPOFOL;  Surgeon: Ladene Artist, MD;  [facility];  Service: Endoscopy;  Laterality: N/A;   COLONOSCOPY WITH PROPOFOL N/A 03/23/2018   Procedure:  COLONOSCOPY WITH PROPOFOL;  Surgeon: Gatha Mayer, MD;  Location: [facility] ENDOSCOPY;  Service: Endoscopy;  Laterality: N/A;   ESOPHAGOGASTRODUODENOSCOPY (EGD) WITH PROPOFOL N/A 10/15/2014   Procedure: ESOPHAGOGASTRODUODENOSCOPY (EGD) WITH PROPOFOL;  Surgeon: Ladene Artist, MD;  [facility];  Service: Endoscopy;  Laterality: N/A;   EXTERNAL FIXATION ANKLE FRACTURE     Fx.  left ankle-fixation with pins later removed sec to infection [place]   left with pins   EYE SURGERY     FEMUR FRACTURE SURGERY  06/2009   FRACTURE SURGERY     HARDWARE REMOVAL Left 10/05/2013   Procedure: HARDWARE REMOVAL LEFT DISTAL FEMUR;  Surgeon: Rozanna Box, MD;  Location: [place];  Service: Orthopedics;  Laterality: Left;   HEMIARTHROPLASTY SHOULDER FRACTURE  06/2009   HOT HEMOSTASIS N/A 03/23/2018   Procedure: HOT HEMOSTASIS (ARGON PLASMA COAGULATION/BICAP);  Surgeon: Gatha Mayer, MD;  Location: [facility] ENDOSCOPY;  Service: Endoscopy;  Laterality: N/A;   JOINT REPLACEMENT     STERIOD INJECTION Right 10/05/2013   Procedure: STEROID INJECTION;  Surgeon: Rozanna Box, MD;  Location: [place];  Service: Orthopedics;  Laterality: Right;   TONSILLECTOMY     TONSILLECTOMY     TOTAL KNEE ARTHROPLASTY  03/09   left    Family History  Problem Relation Age of Onset   Depression Mother    Cancer Mother        uterine cancer   Heart attack Father    Cancer Brother        prostate cancer   Diabetes Maternal Aunt    Arthritis Brother    Asthma Brother    Stroke Maternal Grandmother    Pulmonary embolism Daughter     Social History   Socioeconomic History   Marital status: Widowed    Spouse name: Not on file   Number of children: 4   Years of education: Not on file   Highest education level: Not on file  Occupational History   Occupation: retired crossing guard   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  Tobacco Use   Smoking status: Former    Packs/day: 1.00     Years: 40.00    Pack years: 40.00    Types: Cigarettes    Quit date: 07/06/1993    Years since quitting: 28.1   Smokeless tobacco: Never  Vaping Use   Vaping Use: Never used  Substance and Sexual Activity   Alcohol use: No    Alcohol/week: 0.0 standard drinks  Drug use: No   Sexual activity: Not Currently  Other Topics Concern   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 and adult grandsons   Former smoker, no EtOH      No living will   Plans to do health care POA forms---wants daughter [name]   Would accept resuscitation attempts---no prolonged ventilation   Absolutely no feeding tube   Social Determinants of Health   Financial Resource Strain: Not on file  Food Insecurity: Not on file  Transportation Needs: Not on file  Physical Activity: Not on file  Stress: Not on file  Social Connections: Not on file  Intimate Partner Violence: Not on file   Review of Systems Having some dull headaches now Sleeps just a few hours a night ---falls asleep 2:30AM. Up at 5AM---then naps when daughter goes to work    Objective:   Physical Exam Cardiovascular:     Rate and Rhythm: Normal rate and regular rhythm.     Heart sounds: No murmur heard.   No gallop.  Pulmonary:     Effort: Pulmonary effort is normal.     Breath sounds: Normal breath sounds. No wheezing or rales.  Abdominal:     Palpations: Abdomen is soft.     Tenderness: There is no abdominal tenderness.  Musculoskeletal:     Cervical back: Neck supple.     Right lower leg: No edema.     Left lower leg: No edema.     Comments: Inflamed left 3rd PIP  Lymphadenopathy:     Cervical: No cervical adenopathy.  Neurological:     Mental Status: She is alert.           Assessment & Plan:

## 2021-08-19 NOTE — Assessment & Plan Note (Signed)
No fluid overload Hasn't needed the torsemide 20mg  lately

## 2021-08-19 NOTE — Assessment & Plan Note (Signed)
She has multiple unexplained somatic complaints Discussed that this is likely from depression She is unwilling to try other antidepressants Suggested she look into [facility]

## 2021-08-19 NOTE — Patient Instructions (Signed)
Please look into treatment at [facility] (transcranial magnetic stimulation) on [employer]

## 2021-08-19 NOTE — Assessment & Plan Note (Signed)
BP Readings from Last 3 Encounters:  08/19/21 124/80  07/24/21 110/70  07/18/21 (!) 155/66   BP good back on HCTZ/triamterene (she likes it for her swelling also)

## 2021-08-19 NOTE — Assessment & Plan Note (Signed)
May have active inflammation in one joint On colchicine 0.6mg  daily and allopurinol 300 I again explained the possibility of HCTZ exacerbating this---she is adamant it doesn't affect it

## 2021-09-15 ENCOUNTER — Other Ambulatory Visit: Payer: Self-pay

## 2021-09-15 ENCOUNTER — Other Ambulatory Visit: Payer: Self-pay | Admitting: *Deleted

## 2021-09-15 DIAGNOSIS — D500 Iron deficiency anemia secondary to blood loss (chronic): Secondary | ICD-10-CM

## 2021-09-15 DIAGNOSIS — D649 Anemia, unspecified: Secondary | ICD-10-CM

## 2021-09-15 NOTE — Progress Notes (Incomplete)
Patient Care Team: Venia Carbon, MD as PCP - General Angelena Form Annita Brod, MD as PCP - Cardiology (Cardiology) Sharmon Revere as Physician Assistant (Physician Assistant)  DIAGNOSIS: No diagnosis found.  SUMMARY OF ONCOLOGIC HISTORY: Oncology History   No history exists.    CHIEF COMPLIANT: Follow-up of iron deficiency anemia and bilateral PEs  INTERVAL HISTORY: Chelsea Keller is a 87 y.o. with above-mentioned history of iron deficiency anemia previously treated with IV iron and who currently receives transfusions about every two months (last 10/11/20). She also has a history of bilateral PEs currently on Eliquis. She presents to the clinic today for follow-up.   ALLERGIES:  is allergic to codeine sulfate, celecoxib, erythromycin base, and spironolactone.  MEDICATIONS:  Current Outpatient Medications  Medication Sig Dispense Refill   acetaminophen (TYLENOL) 500 MG tablet Take 1,000-1,500 mg by mouth every 6 (six) hours as needed for moderate pain or headache (pain).      allopurinol (ZYLOPRIM) 100 MG tablet Take 1 tablet (100 mg total) by mouth daily. 30 tablet 6   allopurinol (ZYLOPRIM) 300 MG tablet Take 1 tablet (300 mg total) by mouth daily. 30 tablet 6   colchicine 0.6 MG tablet Take 1 tablet (0.6 mg total) by mouth 2 (two) times daily. 60 tablet 11   Crisaborole (EUCRISA) 2 % OINT Apply to skin daily 60 g 4   ELIQUIS 2.5 MG TABS tablet TAKE 1 TABLET TWICE A DAY 180 tablet 3   fluconazole (DIFLUCAN) 150 MG tablet Take 150 mg by mouth once.     gabapentin (NEURONTIN) 300 MG capsule Take 1 capsule in the morning and 2 capsules at bedtime 270 capsule 3   hydrOXYzine (ATARAX) 10 MG tablet Take 1 tablet (10 mg total) by mouth 3 (three) times daily as needed. 90 tablet 0   KLOR-CON M20 20 MEQ tablet TAKE 2 TABLETS (40 MEQ TOTAL) DAILY 180 tablet 3   ondansetron (ZOFRAN) 4 MG tablet Take 1 tablet (4 mg total) by mouth every 8 (eight) hours as needed for nausea or  vomiting. 20 tablet 0   tiZANidine (ZANAFLEX) 2 MG tablet      torsemide (DEMADEX) 20 MG tablet Take 20 mg by mouth as needed.     traMADol (ULTRAM) 50 MG tablet Take 1 tablet (50 mg total) by mouth 3 (three) times daily as needed for moderate pain. 60 tablet 0   triamterene-hydrochlorothiazide (MAXZIDE-25) 37.5-25 MG tablet Take 1 tablet by mouth daily.     No current facility-administered medications for this visit.    PHYSICAL EXAMINATION: ECOG PERFORMANCE STATUS: {CHL ONC ECOG PS:[id number]}  There were no vitals filed for this visit. There were no vitals filed for this visit.  BREAST:*** No palpable masses or nodules in either right or left breasts. No palpable axillary supraclavicular or infraclavicular adenopathy no breast tenderness or nipple discharge. (exam performed in the presence of a chaperone)  LABORATORY DATA:  I have reviewed the data as listed CMP Latest Ref Rng & Units 08/14/2021 01/14/2021 10/16/2020  Glucose 70 - 99 mg/dL 165(H) 110(H) 102(H)  BUN 8 - 23 mg/dL '18 18 17  '$ Creatinine 0.44 - 1.00 mg/dL 1.02(H) 1.01 1.31(H)  Sodium 135 - 145 mmol/L 138 140 135  Potassium 3.5 - 5.1 mmol/L 4.2 4.0 4.0  Chloride 98 - 111 mmol/L 102 104 103  CO2 22 - 32 mmol/L '29 27 22  '$ Calcium 8.9 - 10.3 mg/dL 9.0 9.4 8.8(L)  Total Protein 6.5 - 8.1 g/dL 5.9(L) - 5.8(L)  Total Bilirubin 0.3 - 1.2 mg/dL 1.2 - 0.9  Alkaline Phos 38 - 126 U/L 106 - 81  AST 15 - 41 U/L 19 - 36  ALT 0 - 44 U/L 14 - 19    Lab Results  Component Value Date   WBC 10.4 08/14/2021   HGB 10.5 (L) 08/14/2021   HCT 34.8 (L) 08/14/2021   MCV 78.4 (L) 08/14/2021   PLT 302 08/14/2021   NEUTROABS 7.8 (H) 08/14/2021    ASSESSMENT & PLAN:  No problem-specific Assessment & Plan notes found for this encounter.    No orders of the defined types were placed in this encounter.  The patient has a good understanding of the overall plan. she agrees with it. she will call with any problems that may develop before the  next visit here. Total time spent: 30 mins including face to face time and time spent for planning, charting and co-ordination of care   Suzzette Righter, [place] 09/15/21  I, Gardiner Coins, am acting as a scribe for Dr. Lindi Adie

## 2021-09-16 ENCOUNTER — Inpatient Hospital Stay: Payer: Medicare Other | Attending: Hematology and Oncology | Admitting: Hematology and Oncology

## 2021-09-16 ENCOUNTER — Inpatient Hospital Stay: Payer: Medicare Other

## 2021-09-16 ENCOUNTER — Other Ambulatory Visit: Payer: Self-pay

## 2021-09-16 DIAGNOSIS — D508 Other iron deficiency anemias: Secondary | ICD-10-CM | POA: Diagnosis not present

## 2021-09-16 DIAGNOSIS — Z79899 Other long term (current) drug therapy: Secondary | ICD-10-CM | POA: Insufficient documentation

## 2021-09-16 DIAGNOSIS — D500 Iron deficiency anemia secondary to blood loss (chronic): Secondary | ICD-10-CM

## 2021-09-16 DIAGNOSIS — Z86711 Personal history of pulmonary embolism: Secondary | ICD-10-CM | POA: Diagnosis not present

## 2021-09-16 DIAGNOSIS — R197 Diarrhea, unspecified: Secondary | ICD-10-CM | POA: Diagnosis not present

## 2021-09-16 DIAGNOSIS — Z881 Allergy status to other antibiotic agents status: Secondary | ICD-10-CM | POA: Diagnosis not present

## 2021-09-16 DIAGNOSIS — Z7901 Long term (current) use of anticoagulants: Secondary | ICD-10-CM | POA: Diagnosis not present

## 2021-09-16 DIAGNOSIS — Z885 Allergy status to narcotic agent status: Secondary | ICD-10-CM | POA: Diagnosis not present

## 2021-09-16 DIAGNOSIS — R5383 Other fatigue: Secondary | ICD-10-CM | POA: Diagnosis not present

## 2021-09-16 DIAGNOSIS — K648 Other hemorrhoids: Secondary | ICD-10-CM | POA: Insufficient documentation

## 2021-09-16 DIAGNOSIS — M7989 Other specified soft tissue disorders: Secondary | ICD-10-CM | POA: Insufficient documentation

## 2021-09-16 LAB — CBC WITH DIFFERENTIAL (CANCER CENTER ONLY)
Abs Immature Granulocytes: 0.05 10*3/uL (ref 0.00–0.07)
Basophils Absolute: 0.1 10*3/uL (ref 0.0–0.1)
Basophils Relative: 1 %
Eosinophils Absolute: 0.5 10*3/uL (ref 0.0–0.5)
Eosinophils Relative: 9 %
HCT: 33.4 % — ABNORMAL LOW (ref 36.0–46.0)
Hemoglobin: 9.8 g/dL — ABNORMAL LOW (ref 12.0–15.0)
Immature Granulocytes: 1 %
Lymphocytes Relative: 17 %
Lymphs Abs: 1 10*3/uL (ref 0.7–4.0)
MCH: 23.8 pg — ABNORMAL LOW (ref 26.0–34.0)
MCHC: 29.3 g/dL — ABNORMAL LOW (ref 30.0–36.0)
MCV: 81.3 fL (ref 80.0–100.0)
Monocytes Absolute: 0.6 10*3/uL (ref 0.1–1.0)
Monocytes Relative: 10 %
Neutro Abs: 3.8 10*3/uL (ref 1.7–7.7)
Neutrophils Relative %: 62 %
Platelet Count: 315 10*3/uL (ref 150–400)
RBC: 4.11 MIL/uL (ref 3.87–5.11)
RDW: 19.3 % — ABNORMAL HIGH (ref 11.5–15.5)
WBC Count: 6 10*3/uL (ref 4.0–10.5)
nRBC: 0 % (ref 0.0–0.2)

## 2021-09-16 LAB — IRON AND IRON BINDING CAPACITY (CC-WL,HP ONLY)
Iron: 37 ug/dL (ref 28–170)
Saturation Ratios: 9 % — ABNORMAL LOW (ref 10.4–31.8)
TIBC: 410 ug/dL (ref 250–450)
UIBC: 373 ug/dL (ref 148–442)

## 2021-09-16 LAB — CMP (CANCER CENTER ONLY)
ALT: 11 U/L (ref 0–44)
AST: 21 U/L (ref 15–41)
Albumin: 3.4 g/dL — ABNORMAL LOW (ref 3.5–5.0)
Alkaline Phosphatase: 97 U/L (ref 38–126)
Anion gap: 7 (ref 5–15)
BUN: 15 mg/dL (ref 8–23)
CO2: 27 mmol/L (ref 22–32)
Calcium: 9 mg/dL (ref 8.9–10.3)
Chloride: 105 mmol/L (ref 98–111)
Creatinine: 1.05 mg/dL — ABNORMAL HIGH (ref 0.44–1.00)
GFR, Estimated: 52 mL/min — ABNORMAL LOW (ref >60)
Glucose, Bld: 104 mg/dL — ABNORMAL HIGH (ref 70–99)
Potassium: 4.6 mmol/L (ref 3.5–5.1)
Sodium: 139 mmol/L (ref 135–145)
Total Bilirubin: 0.7 mg/dL (ref 0.3–1.2)
Total Protein: 5.6 g/dL — ABNORMAL LOW (ref 6.5–8.1)

## 2021-09-16 LAB — SAMPLE TO BLOOD BANK

## 2021-09-16 NOTE — Assessment & Plan Note (Signed)
Hospitalization 08/11/2018: Severe GI bleed hemoglobin 4.9?(internal hemorrhoids and angiodysplasia of the colon)Received blood transfusions ?IV iron treatment: September 2019, March 2020, Injectafer June 2020, Venofer January 2021? ?? ?Toxicities of iron infusion:Patient gets profound diarrhea which last for 2 to 3 days after?Feraheme (and prescription for Lomotil for 3 days).??With Venofer she did okay but she still had profound diarrhea as well as lethargic ?? ?10/10/2019: Hemoglobin 6.8: MCV 82, RDW 18:?2 units of PRBC given on 10/11/2019. ?11/20/2019: Hemoglobin 7.1, MCV 81.2 Iron studies?ferritin 7, iron saturation 6%, absolute reticulocyte count 88.3 ?10/16/2020: Hemoglobin 10.9 (this is after blood transfusions) ?11/26/2020:?Hemoglobin 9.3 ?12/26/2020: Hemoglobin 7.9 (2 units PRBC) ?08/14/2021: Hemoglobin 10.5, MCV 78.4, iron saturation 27%, ferritin 14 ?? ?Blood transfusions: 11/29/2019, 01/02/2020, October 2021, April 2022, 12/26/2020 ?  ?She does not want to receive Aranesp injections.  ? ?Because of her severe intolerance with the IV iron we are unable to do so. ?She is also intolerant to oral iron therapy. ?? ?Current treatment:?  ?? ?Return to clinic in?2?months with labs and blood transfusion if needed. ?

## 2021-09-17 LAB — FERRITIN: Ferritin: 14 ng/mL (ref 11–307)

## 2021-09-19 ENCOUNTER — Telehealth: Payer: Self-pay | Admitting: Hematology and Oncology

## 2021-09-19 NOTE — Telephone Encounter (Signed)
Scheduled appointment per 3/15 los. Left message. Patient will be mailed an updated calendar. ?

## 2021-11-03 NOTE — Progress Notes (Incomplete)
? ?Patient Care Team: ?Venia Carbon, MD as PCP - General ?Burnell Blanks, MD as PCP - Cardiology (Cardiology) ?Liliane Shi PA-C as Physician Assistant (Physician Assistant) ? ?DIAGNOSIS: No diagnosis found. ? ?SUMMARY OF ONCOLOGIC HISTORY: ?Oncology History  ? No history exists.  ? ? ?CHIEF COMPLIANT:  Follow-up of iron deficiency anemia and bilateral PEs ? ?INTERVAL HISTORY: Chelsea Keller is a 87 y.o. with above-mentioned history of iron deficiency anemia previously treated with IV iron and who currently receives transfusions about every two months (last 10/11/20). She also has a history of bilateral PEs currently on Eliquis. She presents to the clinic today for follow-up. ? ? ?ALLERGIES:  is allergic to codeine sulfate, celecoxib, erythromycin base, and spironolactone. ? ?MEDICATIONS:  ?Current Outpatient Medications  ?Medication Sig Dispense Refill  ? acetaminophen (TYLENOL) 500 MG tablet Take 1,000-1,500 mg by mouth every 6 (six) hours as needed for moderate pain or headache (pain).     ? allopurinol (ZYLOPRIM) 100 MG tablet Take 1 tablet (100 mg total) by mouth daily. 30 tablet 6  ? allopurinol (ZYLOPRIM) 300 MG tablet Take 1 tablet (300 mg total) by mouth daily. 30 tablet 6  ? colchicine 0.6 MG tablet Take 1 tablet (0.6 mg total) by mouth 2 (two) times daily. 60 tablet 11  ? Crisaborole (EUCRISA) 2 % OINT Apply to skin daily 60 g 4  ? ELIQUIS 2.5 MG TABS tablet TAKE 1 TABLET TWICE A DAY 180 tablet 3  ? fluconazole (DIFLUCAN) 150 MG tablet Take 150 mg by mouth once.    ? gabapentin (NEURONTIN) 300 MG capsule Take 1 capsule in the morning and 2 capsules at bedtime 270 capsule 3  ? hydrOXYzine (ATARAX) 10 MG tablet Take 1 tablet (10 mg total) by mouth 3 (three) times daily as needed. 90 tablet 0  ? KLOR-CON M20 20 MEQ tablet TAKE 2 TABLETS (40 MEQ TOTAL) DAILY 180 tablet 3  ? ondansetron (ZOFRAN) 4 MG tablet Take 1 tablet (4 mg total) by mouth every 8 (eight) hours as needed for nausea or  vomiting. 20 tablet 0  ? tiZANidine (ZANAFLEX) 2 MG tablet     ? torsemide (DEMADEX) 20 MG tablet Take 20 mg by mouth as needed.    ? traMADol (ULTRAM) 50 MG tablet Take 1 tablet (50 mg total) by mouth 3 (three) times daily as needed for moderate pain. 60 tablet 0  ? triamterene-hydrochlorothiazide (MAXZIDE-25) 37.5-25 MG tablet Take 1 tablet by mouth daily.    ? ?No current facility-administered medications for this visit.  ? ? ?PHYSICAL EXAMINATION: ?ECOG PERFORMANCE STATUS: {CHL ONC ECOG JK:[id number]} ? ?There were no vitals filed for this visit. ?There were no vitals filed for this visit. ? ?BREAST:*** No palpable masses or nodules in either right or left breasts. No palpable axillary supraclavicular or infraclavicular adenopathy no breast tenderness or nipple discharge. (exam performed in the presence of a chaperone) ? ?LABORATORY DATA:  ?I have reviewed the data as listed ? ?  Latest Ref Rng & Units 09/16/2021  ?  3:18 PM 08/14/2021  ?  3:06 PM 01/14/2021  ? 12:35 PM  ?CMP  ?Glucose 70 - 99 mg/dL 104   165   110    ?BUN 8 - 23 mg/dL '15   18   18    '$ ?Creatinine 0.44 - 1.00 mg/dL 1.05   1.02   1.01    ?Sodium 135 - 145 mmol/L 139   138   140    ?Potassium 3.5 -  5.1 mmol/L 4.6   4.2   4.0    ?Chloride 98 - 111 mmol/L 105   102   104    ?CO2 22 - 32 mmol/L '27   29   27    '$ ?Calcium 8.9 - 10.3 mg/dL 9.0   9.0   9.4    ?Total Protein 6.5 - 8.1 g/dL 5.6   5.9     ?Total Bilirubin 0.3 - 1.2 mg/dL 0.7   1.2     ?Alkaline Phos 38 - 126 U/L 97   106     ?AST 15 - 41 U/L 21   19     ?ALT 0 - 44 U/L 11   14     ? ? ?Lab Results  ?Component Value Date  ? WBC 6.0 09/16/2021  ? HGB 9.8 (L) 09/16/2021  ? HCT 33.4 (L) 09/16/2021  ? MCV 81.3 09/16/2021  ? PLT 315 09/16/2021  ? NEUTROABS 3.8 09/16/2021  ? ? ?ASSESSMENT & PLAN:  ?No problem-specific Assessment & Plan notes found for this encounter. ? ? ? ?No orders of the defined types were placed in this encounter. ? ?The patient has a good understanding of the overall plan. she  agrees with it. she will call with any problems that may develop before the next visit here. ?Total time spent: 30 mins including face to face time and time spent for planning, charting and co-ordination of care ? ? Suzzette Righter, CMA ?11/03/21 ? ? ? I Brinklee Cisse, [place] am scribing for Dr. Lindi Adie ? ?***  ?

## 2021-11-04 ENCOUNTER — Inpatient Hospital Stay: Payer: Medicare Other | Attending: Hematology and Oncology

## 2021-11-04 ENCOUNTER — Other Ambulatory Visit: Payer: Self-pay | Admitting: *Deleted

## 2021-11-04 ENCOUNTER — Other Ambulatory Visit: Payer: Self-pay

## 2021-11-04 ENCOUNTER — Telehealth: Payer: Self-pay | Admitting: Hematology and Oncology

## 2021-11-04 ENCOUNTER — Encounter: Payer: Self-pay | Admitting: Internal Medicine

## 2021-11-04 ENCOUNTER — Encounter: Payer: Self-pay | Admitting: Hematology and Oncology

## 2021-11-04 DIAGNOSIS — Z881 Allergy status to other antibiotic agents status: Secondary | ICD-10-CM | POA: Diagnosis not present

## 2021-11-04 DIAGNOSIS — Z79899 Other long term (current) drug therapy: Secondary | ICD-10-CM | POA: Insufficient documentation

## 2021-11-04 DIAGNOSIS — Z7901 Long term (current) use of anticoagulants: Secondary | ICD-10-CM | POA: Diagnosis not present

## 2021-11-04 DIAGNOSIS — K922 Gastrointestinal hemorrhage, unspecified: Secondary | ICD-10-CM | POA: Diagnosis not present

## 2021-11-04 DIAGNOSIS — Z885 Allergy status to narcotic agent status: Secondary | ICD-10-CM | POA: Diagnosis not present

## 2021-11-04 DIAGNOSIS — D500 Iron deficiency anemia secondary to blood loss (chronic): Secondary | ICD-10-CM

## 2021-11-04 LAB — CBC WITH DIFFERENTIAL (CANCER CENTER ONLY)
Abs Immature Granulocytes: 0.07 10*3/uL (ref 0.00–0.07)
Basophils Absolute: 0.1 10*3/uL (ref 0.0–0.1)
Basophils Relative: 1 %
Eosinophils Absolute: 0.9 10*3/uL — ABNORMAL HIGH (ref 0.0–0.5)
Eosinophils Relative: 8 %
HCT: 29.2 % — ABNORMAL LOW (ref 36.0–46.0)
Hemoglobin: 8.7 g/dL — ABNORMAL LOW (ref 12.0–15.0)
Immature Granulocytes: 1 %
Lymphocytes Relative: 10 %
Lymphs Abs: 1.1 10*3/uL (ref 0.7–4.0)
MCH: 23.8 pg — ABNORMAL LOW (ref 26.0–34.0)
MCHC: 29.8 g/dL — ABNORMAL LOW (ref 30.0–36.0)
MCV: 79.8 fL — ABNORMAL LOW (ref 80.0–100.0)
Monocytes Absolute: 1 10*3/uL (ref 0.1–1.0)
Monocytes Relative: 9 %
Neutro Abs: 8.1 10*3/uL — ABNORMAL HIGH (ref 1.7–7.7)
Neutrophils Relative %: 71 %
Platelet Count: 350 10*3/uL (ref 150–400)
RBC: 3.66 MIL/uL — ABNORMAL LOW (ref 3.87–5.11)
RDW: 17.8 % — ABNORMAL HIGH (ref 11.5–15.5)
WBC Count: 11.2 10*3/uL — ABNORMAL HIGH (ref 4.0–10.5)
nRBC: 0 % (ref 0.0–0.2)

## 2021-11-04 LAB — SAMPLE TO BLOOD BANK

## 2021-11-04 LAB — IRON AND IRON BINDING CAPACITY (CC-WL,HP ONLY)
Iron: 28 ug/dL (ref 28–170)
Saturation Ratios: 7 % — ABNORMAL LOW (ref 10.4–31.8)
TIBC: 428 ug/dL (ref 250–450)
UIBC: 400 ug/dL (ref 148–442)

## 2021-11-04 LAB — PREPARE RBC (CROSSMATCH)

## 2021-11-04 LAB — FERRITIN: Ferritin: 10 ng/mL — ABNORMAL LOW (ref 11–307)

## 2021-11-04 NOTE — Progress Notes (Signed)
Pt Hgb 8.7 and symptomatic.  Verbal orders received from MD for pt to receive 1 unit PRBC's.  Orders placed, appt scheduled and pt daughter verbalized understanding of appt date and time.  ?

## 2021-11-05 ENCOUNTER — Inpatient Hospital Stay: Payer: Medicare Other

## 2021-11-05 DIAGNOSIS — Z885 Allergy status to narcotic agent status: Secondary | ICD-10-CM | POA: Diagnosis not present

## 2021-11-05 DIAGNOSIS — Z7901 Long term (current) use of anticoagulants: Secondary | ICD-10-CM | POA: Diagnosis not present

## 2021-11-05 DIAGNOSIS — D500 Iron deficiency anemia secondary to blood loss (chronic): Secondary | ICD-10-CM

## 2021-11-05 DIAGNOSIS — Z79899 Other long term (current) drug therapy: Secondary | ICD-10-CM | POA: Diagnosis not present

## 2021-11-05 DIAGNOSIS — K922 Gastrointestinal hemorrhage, unspecified: Secondary | ICD-10-CM | POA: Diagnosis not present

## 2021-11-05 DIAGNOSIS — Z881 Allergy status to other antibiotic agents status: Secondary | ICD-10-CM | POA: Diagnosis not present

## 2021-11-05 MED ORDER — ACETAMINOPHEN 325 MG PO TABS
650.0000 mg | ORAL_TABLET | Freq: Once | ORAL | Status: AC
  Administered 2021-11-05: 650 mg via ORAL
  Filled 2021-11-05: qty 2

## 2021-11-05 MED ORDER — DIPHENHYDRAMINE HCL 25 MG PO CAPS
25.0000 mg | ORAL_CAPSULE | Freq: Once | ORAL | Status: AC
  Administered 2021-11-05: 25 mg via ORAL
  Filled 2021-11-05: qty 1

## 2021-11-05 MED ORDER — SODIUM CHLORIDE 0.9% IV SOLUTION
250.0000 mL | Freq: Once | INTRAVENOUS | Status: AC
  Administered 2021-11-05: 250 mL via INTRAVENOUS

## 2021-11-05 NOTE — Patient Instructions (Signed)
Blood Transfusion, Adult, Care After This sheet gives you information about how to care for yourself after your procedure. Your doctor may also give you more specific instructions. If you have problems or questions, contact your doctor. What can I expect after the procedure? After the procedure, it is common to have: Bruising and soreness at the IV site. A headache. Follow these instructions at home: Insertion site care     Follow instructions from your doctor about how to take care of your insertion site. This is where an IV tube was put into your vein. Make sure you: Wash your hands with soap and water before and after you change your bandage (dressing). If you cannot use soap and water, use hand sanitizer. Change your bandage as told by your doctor. Check your insertion site every day for signs of infection. Check for: Redness, swelling, or pain. Bleeding from the site. Warmth. Pus or a bad smell. General instructions Take over-the-counter and prescription medicines only as told by your doctor. Rest as told by your doctor. Go back to your normal activities as told by your doctor. Keep all follow-up visits as told by your doctor. This is important. Contact a doctor if: You have itching or red, swollen areas of skin (hives). You feel worried or nervous (anxious). You feel weak after doing your normal activities. You have redness, swelling, warmth, or pain around the insertion site. You have blood coming from the insertion site, and the blood does not stop with pressure. You have pus or a bad smell coming from the insertion site. Get help right away if: You have signs of a serious reaction. This may be coming from an allergy or the body's defense system (immune system). Signs include: Trouble breathing or shortness of breath. Swelling of the face or feeling warm (flushed). Fever or chills. Head, chest, or back pain. Dark pee (urine) or blood in the pee. Widespread rash. Fast  heartbeat. Feeling dizzy or light-headed. You may receive your blood transfusion in an outpatient setting. If so, you will be told whom to contact to report any reactions. These symptoms may be an emergency. Do not wait to see if the symptoms will go away. Get medical help right away. Call your local emergency services (911 in the [place] Do not drive yourself to the hospital. Summary Bruising and soreness at the IV site are common. Check your insertion site every day for signs of infection. Rest as told by your doctor. Go back to your normal activities as told by your doctor. Get help right away if you have signs of a serious reaction. This information is not intended to replace advice given to you by your health care provider. Make sure you discuss any questions you have with your health care provider. Document Revised: 10/17/2020 Document Reviewed: 12/15/2018 Elsevier Patient Education  2023 Elsevier Inc.  

## 2021-11-06 LAB — TYPE AND SCREEN
ABO/RH(D): B POS
Antibody Screen: NEGATIVE
Unit division: 0

## 2021-11-06 LAB — BPAM RBC
Blood Product Expiration Date: 202305222359
ISSUE DATE / TIME: 202305030926
Unit Type and Rh: 7300

## 2021-11-12 ENCOUNTER — Other Ambulatory Visit: Payer: Self-pay | Admitting: Hematology and Oncology

## 2021-11-12 ENCOUNTER — Encounter: Payer: Self-pay | Admitting: Internal Medicine

## 2021-11-12 ENCOUNTER — Ambulatory Visit (INDEPENDENT_AMBULATORY_CARE_PROVIDER_SITE_OTHER): Payer: Medicare Other | Admitting: Internal Medicine

## 2021-11-12 VITALS — BP 122/64 | HR 72 | Temp 97.8°F | Ht 62.0 in | Wt 268.0 lb

## 2021-11-12 DIAGNOSIS — I2699 Other pulmonary embolism without acute cor pulmonale: Secondary | ICD-10-CM

## 2021-11-12 DIAGNOSIS — M1000 Idiopathic gout, unspecified site: Secondary | ICD-10-CM

## 2021-11-12 DIAGNOSIS — L280 Lichen simplex chronicus: Secondary | ICD-10-CM | POA: Diagnosis not present

## 2021-11-12 DIAGNOSIS — I700 Atherosclerosis of aorta: Secondary | ICD-10-CM

## 2021-11-12 DIAGNOSIS — D638 Anemia in other chronic diseases classified elsewhere: Secondary | ICD-10-CM

## 2021-11-12 DIAGNOSIS — F39 Unspecified mood [affective] disorder: Secondary | ICD-10-CM | POA: Diagnosis not present

## 2021-11-12 DIAGNOSIS — Z0000 Encounter for general adult medical examination without abnormal findings: Secondary | ICD-10-CM | POA: Diagnosis not present

## 2021-11-12 DIAGNOSIS — N1831 Chronic kidney disease, stage 3a: Secondary | ICD-10-CM

## 2021-11-12 DIAGNOSIS — I5032 Chronic diastolic (congestive) heart failure: Secondary | ICD-10-CM | POA: Diagnosis not present

## 2021-11-12 DIAGNOSIS — D6869 Other thrombophilia: Secondary | ICD-10-CM | POA: Diagnosis not present

## 2021-11-12 MED ORDER — ALLOPURINOL 300 MG PO TABS: 300.0000 mg | ORAL_TABLET | Freq: Every day | ORAL | 3 refills | Status: DC

## 2021-11-12 MED ORDER — TRAMADOL HCL 50 MG PO TABS: 50.0000 mg | ORAL_TABLET | Freq: Three times a day (TID) | ORAL | 0 refills | Status: DC | PRN

## 2021-11-12 MED ORDER — ALLOPURINOL 100 MG PO TABS: 100.0000 mg | ORAL_TABLET | Freq: Every day | ORAL | 3 refills | Status: DC

## 2021-11-12 NOTE — Assessment & Plan Note (Signed)
Chronic dysthymia ?No MDD ?Will hold off on meds ?

## 2021-11-12 NOTE — Assessment & Plan Note (Signed)
Last GFR 52 ?No ACEI/ARB at this point ?

## 2021-11-12 NOTE — Assessment & Plan Note (Signed)
Chronic DOE but nothing to suggest recurrence on the apixaban 2.5 bid ?

## 2021-11-12 NOTE — Assessment & Plan Note (Signed)
Recurred when she stopped her meds----now on allopurinol '400mg'$  daily and colchcine 0.6 bid ?

## 2021-11-12 NOTE — Progress Notes (Signed)
? ?Subjective:  ? ? Patient ID: Chelsea Keller, female    DOB: 02-Jun-1935, 87 y.o.   MRN: [id number] ? ?HPI ?Here with daughter [name] for Medicare wellness visit and follow up of chronic health conditions ?Reviewed advanced directives ?Reviewed other doctors---Dr Gudena--hematology, Dr McAlhany--cardiology, Dr Maryellen Pile ?No hospitalizations or surgery in the past year ?Rare drink of alcohol ?No tobacco ?Vision is fair ?Hearing is not very good---not interested in hearing aides ?No falls ?Some chronic mood issues ?Daughter does all the instrumental ADLs---she does her own ADLs though ?Memory is okay ? ?Having some sores on distal arms and legs ?Chronic itching---hydroxyzine not really helping  ?Has Rx for cortisone cream---not that effective ?Uses safeguard soap---urged her to try moisturizing soap ?She thinks cortizone-10 worked better than the Rx cream ? ?Gets intermittent pain ?Uses tramadol but also likes the oxycodone once in a while ?Needs the prednisone intermittently due to gout flares---and also for hip OA (this gets worse when she needs to travel) ? ?On higher dose allopurinol ?Had tried off this and the colchicine--and her gout flared badly ?Restarted them 2 weeks ago ? ?No chest pain ?Chronic DOE---relates to her weight ?BMI 49--is down a little over the past 2-3 years ?Some dizziness (related to hydroxyzine)--but no syncope  ?Chronic edema---intermittent edema. Only takes the torsemide prn (like for the weeping) ? ? ?Has depression that "comes and goes" ?Stuck up in room---mobility issues ?Not anhedonic but misses stuff she can't do now ? ?Chronic anemia ?Needed transfusion last week---keeps up with Dr Lindi Adie ? ?Last GFR 52 ? ?Current Outpatient Medications on File Prior to Visit  ?Medication Sig Dispense Refill  ? acetaminophen (TYLENOL) 500 MG tablet Take 1,000-1,500 mg by mouth every 6 (six) hours as needed for moderate pain or headache (pain).     ? allopurinol (ZYLOPRIM) 100 MG tablet Take 1  tablet (100 mg total) by mouth daily. 30 tablet 6  ? allopurinol (ZYLOPRIM) 300 MG tablet Take 1 tablet (300 mg total) by mouth daily. 30 tablet 6  ? colchicine 0.6 MG tablet Take 1 tablet (0.6 mg total) by mouth 2 (two) times daily. 60 tablet 11  ? Crisaborole (EUCRISA) 2 % OINT Apply to skin daily 60 g 4  ? ELIQUIS 2.5 MG TABS tablet TAKE 1 TABLET TWICE A DAY 180 tablet 3  ? fluconazole (DIFLUCAN) 150 MG tablet Take 150 mg by mouth once.    ? gabapentin (NEURONTIN) 300 MG capsule Take 1 capsule in the morning and 2 capsules at bedtime 270 capsule 3  ? hydrOXYzine (ATARAX) 10 MG tablet Take 1 tablet (10 mg total) by mouth 3 (three) times daily as needed. 90 tablet 0  ? KLOR-CON M20 20 MEQ tablet TAKE 2 TABLETS (40 MEQ TOTAL) DAILY 180 tablet 3  ? ondansetron (ZOFRAN) 4 MG tablet Take 1 tablet (4 mg total) by mouth every 8 (eight) hours as needed for nausea or vomiting. 20 tablet 0  ? tiZANidine (ZANAFLEX) 2 MG tablet     ? torsemide (DEMADEX) 20 MG tablet Take 20 mg by mouth as needed.    ? traMADol (ULTRAM) 50 MG tablet Take 1 tablet (50 mg total) by mouth 3 (three) times daily as needed for moderate pain. 60 tablet 0  ? triamterene-hydrochlorothiazide (MAXZIDE-25) 37.5-25 MG tablet Take 1 tablet by mouth daily.    ? ?No current facility-administered medications on file prior to visit.  ? ? ?Allergies  ?Allergen Reactions  ? Codeine Sulfate Shortness Of Breath  ? Celecoxib Other (See  Comments)  ?  Caused vaginal bleeding  ? Erythromycin Base Nausea And Vomiting  ? Spironolactone Nausea Only and Other (See Comments)  ?  Blurred vision, Upset stomach, lethargy  ? ? ?Past Medical History:  ?Diagnosis Date  ? Allergy   ? Anemia   ? Anxiety   ? Arthritis   ? Arthritis of sacroiliac joint of both sides 11/12/2017  ? Bilateral pulmonary embolism ([facility]) 06/23/2017  ? Chronic diastolic CHF (congestive heart failure) ([facility])   ? a. Echo 1/16:  mild LVH, EF normal, grade 1 DD, MAC  ? Chronic venous insufficiency   ? chronic LE  edema  ? DDD (degenerative disc disease), lumbar 11/12/2017  ? Degenerative joint disease (DJD) of hip, Bilateral  11/12/2017  ? Depression   ? Fibromyalgia   ? constant pain  ? Hx of cardiac catheterization   ? a. LHC in [place] "ok" per patient with mild plaque in a single vessel - records not available  ? Hx of cardiovascular stress test   ? a. Nuclear study in 2008 normal  ? Hx of colonic polyps   ? Hypertension   ? Hypertriglyceridemia   ? Impaired fasting glucose   ? PONV (postoperative nausea and vomiting)   ? PUD (peptic ulcer disease)   ? hx of gastric ulcer  ? Pulmonary emboli ([facility]) 06/2017  ? Vitamin B12 deficiency   ? ? ?Past Surgical History:  ?Procedure Laterality Date  ? ABDOMINAL HYSTERECTOMY    ? CATARACT EXTRACTION  10/2003  ? OD  ? CHOLECYSTECTOMY    ? COLONOSCOPY W/ POLYPECTOMY    ? COLONOSCOPY WITH PROPOFOL N/A 10/15/2014  ? Procedure: COLONOSCOPY WITH PROPOFOL;  Surgeon: Ladene Artist, MD;  Location: [facility];  Service: Endoscopy;  Laterality: N/A;  ? COLONOSCOPY WITH PROPOFOL N/A 03/23/2018  ? Procedure: COLONOSCOPY WITH PROPOFOL;  Surgeon: Gatha Mayer, MD;  Location: [facility] ENDOSCOPY;  Service: Endoscopy;  Laterality: N/A;  ? ESOPHAGOGASTRODUODENOSCOPY (EGD) WITH PROPOFOL N/A 10/15/2014  ? Procedure: ESOPHAGOGASTRODUODENOSCOPY (EGD) WITH PROPOFOL;  Surgeon: Ladene Artist, MD;  Location: [facility];  Service: Endoscopy;  Laterality: N/A;  ? EXTERNAL FIXATION ANKLE FRACTURE    ? Fx.  left ankle-fixation with pins later removed sec to infection 1985  ? EXTERNAL FIXATION WRIST FRACTURE  1985  ? left with pins  ? EYE SURGERY    ? FEMUR FRACTURE SURGERY  06/2009  ? FRACTURE SURGERY    ? HARDWARE REMOVAL Left 10/05/2013  ? Procedure: HARDWARE REMOVAL LEFT DISTAL FEMUR;  Surgeon: Rozanna Box, MD;  Location: [facility];  Service: Orthopedics;  Laterality: Left;  ? HEMIARTHROPLASTY SHOULDER FRACTURE  06/2009  ? HOT HEMOSTASIS N/A 03/23/2018  ? Procedure: HOT HEMOSTASIS (ARGON PLASMA COAGULATION/BICAP);   Surgeon: Gatha Mayer, MD;  Location: [facility] END[facility]COPY;  Service: Endoscopy;  Laterality: N/A;  ? JOINT REPLACEMENT    ? STERIOD INJECTION Right 10/05/2013  ? Procedure: STEROID INJECTION;  Surgeon: Rozanna Box, MD;  Location: [facility];  Service: Orthopedics;  Laterality: Right;  ? TONSILLECTOMY    ? TONSILLECTOMY    ? TOTAL KNEE ARTHROPLASTY  03/09  ? left  ? ? ?Family History  ?Problem Relation Age of Onset  ? Depression Mother   ? Cancer Mother   ?     uterine cancer  ? Heart attack Father   ? Cancer Brother   ?     prostate cancer  ? Diabetes Maternal Aunt   ? Arthritis Brother   ? Asthma  Brother   ? Stroke Maternal Grandmother   ? Pulmonary embolism Daughter   ? ? ?Social History  ? ?Socioeconomic History  ? Marital status: Widowed  ?  Spouse name: Not on file  ? Number of children: 4  ? Years of education: Not on file  ? Highest education level: Not on file  ?Occupational History  ? Occupation: retired crossing guard  ? 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 Smoking status: Former  ?  Packs/day: 1.00  ?  Years: 40.00  ?  Pack years: 40.00  ?  Types: Cigarettes  ?  Quit date: 07/06/1993  ?  Years since quitting: 28.3  ? Smokeless tobacco: Never  ?Vaping Use  ? Vaping Use: Never used  ?Substance and Sexual Activity  ? Alcohol use: No  ?  Alcohol/week: 0.0 standard drinks  ? Drug use: No  ? Sexual activity: Not Currently  ?Other Topics Concern  ? Not on file  ?Social History Narrative  ? Retired crossing guard  ? Moved to [facility] from [place] 2004-5  ? Widow, lives w/ daughter and adult grandsons  ? Former smoker, no EtOH  ?   ? No living will  ? Plans to do health care POA forms---wants daughter [name]  ? Would accept resuscitation attempts---no prolonged ventilation  ? Absolutely no feeding tube  ? ?Social Determinants of Health  ? ?Financial Resource Strain: Not on file  ?Food Insecurity: Not on file  ?Transportation Needs: Not on file  ?Physical Activity: Not on file  ?Stress: Not on file   ?Social Connections: Not on file  ?Intimate Partner Violence: Not on file  ? ?Review of Systems ?Appetite is fine ?Sleeps okay ?Wears seat belt ?Full dentures---hasn't needed dentist ?Occasional heartburn---no

## 2021-11-12 NOTE — Assessment & Plan Note (Signed)
On imaging No statin 

## 2021-11-12 NOTE — Assessment & Plan Note (Signed)
Needs moisturizing soap, moisturizers, then cortisone cream ?

## 2021-11-12 NOTE — Assessment & Plan Note (Signed)
Bruises a lot on the eliquis--but needs to continue ?

## 2021-11-12 NOTE — Assessment & Plan Note (Signed)
I have personally reviewed the Medicare Annual Wellness questionnaire and have noted ?1. The patient's medical and social history ?2. Their use of alcohol, tobacco or illicit drugs ?3. Their current medications and supplements ?4. The patient's functional ability including ADL's, fall risks, home safety risks and hearing or visual ?            impairment. ?5. Diet and physical activities ?6. Evidence for depression or mood disorders ? ?The patients weight, height, BMI and visual acuity have been recorded in the chart ?I have made referrals, counseling and provided education to the patient based review of the above and I have provided the pt with a written personalized care plan for preventive services. ? ?I have provided you with a copy of your personalized plan for preventive services. Please take the time to review along with your updated medication list. ? ?No cancer screening due to age ?Unable to exercise ?Adamantly won't take any vaccines ?

## 2021-11-12 NOTE — Assessment & Plan Note (Signed)
Compensated now ?Takes the torsemide '20mg'$  prn (when weeping occurs) ?

## 2021-11-12 NOTE — Assessment & Plan Note (Signed)
Discussed trying to eat as healthy as possible ?

## 2021-11-12 NOTE — Progress Notes (Signed)
Vision Screening  ? Right eye Left eye Both eyes  ?Without correction '20/40 20/40 20/30 '$  ?With correction     ?Hearing Screening - Comments:: Did not pass whisper test ? ?

## 2021-11-14 ENCOUNTER — Other Ambulatory Visit: Payer: Self-pay

## 2021-11-14 DIAGNOSIS — D638 Anemia in other chronic diseases classified elsewhere: Secondary | ICD-10-CM

## 2021-11-17 ENCOUNTER — Ambulatory Visit: Payer: Medicare Other | Admitting: Hematology and Oncology

## 2021-11-17 ENCOUNTER — Inpatient Hospital Stay: Payer: Medicare Other

## 2021-11-17 ENCOUNTER — Other Ambulatory Visit: Payer: Self-pay

## 2021-11-17 DIAGNOSIS — Z79899 Other long term (current) drug therapy: Secondary | ICD-10-CM | POA: Diagnosis not present

## 2021-11-17 DIAGNOSIS — Z885 Allergy status to narcotic agent status: Secondary | ICD-10-CM | POA: Diagnosis not present

## 2021-11-17 DIAGNOSIS — D500 Iron deficiency anemia secondary to blood loss (chronic): Secondary | ICD-10-CM | POA: Diagnosis not present

## 2021-11-17 DIAGNOSIS — K922 Gastrointestinal hemorrhage, unspecified: Secondary | ICD-10-CM | POA: Diagnosis not present

## 2021-11-17 DIAGNOSIS — Z881 Allergy status to other antibiotic agents status: Secondary | ICD-10-CM | POA: Diagnosis not present

## 2021-11-17 DIAGNOSIS — D638 Anemia in other chronic diseases classified elsewhere: Secondary | ICD-10-CM

## 2021-11-17 DIAGNOSIS — Z7901 Long term (current) use of anticoagulants: Secondary | ICD-10-CM | POA: Diagnosis not present

## 2021-11-17 LAB — CBC WITH DIFFERENTIAL (CANCER CENTER ONLY)
Abs Immature Granulocytes: 0.02 10*3/uL (ref 0.00–0.07)
Basophils Absolute: 0.1 10*3/uL (ref 0.0–0.1)
Basophils Relative: 1 %
Eosinophils Absolute: 1.5 10*3/uL — ABNORMAL HIGH (ref 0.0–0.5)
Eosinophils Relative: 22 %
HCT: 32 % — ABNORMAL LOW (ref 36.0–46.0)
Hemoglobin: 9.5 g/dL — ABNORMAL LOW (ref 12.0–15.0)
Immature Granulocytes: 0 %
Lymphocytes Relative: 11 %
Lymphs Abs: 0.7 10*3/uL (ref 0.7–4.0)
MCH: 23.9 pg — ABNORMAL LOW (ref 26.0–34.0)
MCHC: 29.7 g/dL — ABNORMAL LOW (ref 30.0–36.0)
MCV: 80.6 fL (ref 80.0–100.0)
Monocytes Absolute: 0.6 10*3/uL (ref 0.1–1.0)
Monocytes Relative: 8 %
Neutro Abs: 3.8 10*3/uL (ref 1.7–7.7)
Neutrophils Relative %: 58 %
Platelet Count: 311 10*3/uL (ref 150–400)
RBC: 3.97 MIL/uL (ref 3.87–5.11)
RDW: 18.1 % — ABNORMAL HIGH (ref 11.5–15.5)
WBC Count: 6.6 10*3/uL (ref 4.0–10.5)
nRBC: 0 % (ref 0.0–0.2)

## 2021-11-17 LAB — IRON AND IRON BINDING CAPACITY (CC-WL,HP ONLY)
Iron: 40 ug/dL (ref 28–170)
Saturation Ratios: 9 % — ABNORMAL LOW (ref 10.4–31.8)
TIBC: 431 ug/dL (ref 250–450)
UIBC: 391 ug/dL (ref 148–442)

## 2021-11-17 LAB — SAMPLE TO BLOOD BANK

## 2021-11-17 LAB — URIC ACID: Uric Acid, Serum: 5.5 mg/dL (ref 2.5–7.1)

## 2021-11-18 ENCOUNTER — Encounter: Payer: Self-pay | Admitting: Hematology and Oncology

## 2021-11-18 ENCOUNTER — Inpatient Hospital Stay (HOSPITAL_BASED_OUTPATIENT_CLINIC_OR_DEPARTMENT_OTHER): Payer: Medicare Other | Admitting: Hematology and Oncology

## 2021-11-18 ENCOUNTER — Inpatient Hospital Stay: Payer: Medicare Other

## 2021-11-18 DIAGNOSIS — Z885 Allergy status to narcotic agent status: Secondary | ICD-10-CM | POA: Diagnosis not present

## 2021-11-18 DIAGNOSIS — Z79899 Other long term (current) drug therapy: Secondary | ICD-10-CM | POA: Diagnosis not present

## 2021-11-18 DIAGNOSIS — K922 Gastrointestinal hemorrhage, unspecified: Secondary | ICD-10-CM | POA: Diagnosis not present

## 2021-11-18 DIAGNOSIS — D500 Iron deficiency anemia secondary to blood loss (chronic): Secondary | ICD-10-CM | POA: Diagnosis not present

## 2021-11-18 DIAGNOSIS — Z881 Allergy status to other antibiotic agents status: Secondary | ICD-10-CM | POA: Diagnosis not present

## 2021-11-18 DIAGNOSIS — Z7901 Long term (current) use of anticoagulants: Secondary | ICD-10-CM | POA: Diagnosis not present

## 2021-11-18 LAB — FERRITIN: Ferritin: 8 ng/mL — ABNORMAL LOW (ref 11–307)

## 2021-11-18 NOTE — Assessment & Plan Note (Signed)
Hospitalization 08/11/2018: Severe GI bleed hemoglobin 4.9?(internal hemorrhoids and angiodysplasia of the colon)Received blood transfusions ?IV iron treatment: September 2019, March 2020, Injectafer June 2020, Venofer January 2021? ?? ?Toxicities of iron infusion:Patient gets profound diarrhea which last for 2 to 3 days after?Feraheme (and prescription for Lomotil for 3 days).??With Venofer she did okay but she still had profound diarrhea as well as lethargic ?? ?10/10/2019: Hemoglobin 6.8: MCV 82, RDW 18:?2 units of PRBC given on 10/11/2019. ?11/20/2019: Hemoglobin 7.1, MCV 81.2 Iron studies?ferritin 7, iron saturation 6%, absolute reticulocyte count 88.3 ?12/26/2020: Hemoglobin 7.9 (2 units PRBC) ?08/14/2021: Hemoglobin 10.5, MCV 78.4, iron saturation 27%, ferritin 14 ?09/16/2021: Hemoglobin 9.8, MCV 81.3 ?11/17/2021: Hemoglobin 9.5, MCV 80.6, RDW 18.1 ?? ?Blood transfusions: 11/29/2019, 01/02/2020, October 2021, April 2022, 12/26/2020, 11/12/2021 ?? ?She does not want to receive Aranesp injections.  ? ?Because of her severe intolerance with the IV iron we are unable to do so. ?She is also intolerant to oral iron therapy. ?? ?Current treatment:?Observation and blood transfusions as needed ?? ?Return to clinic in?2?months?with labs and blood transfusion if needed. ?

## 2021-11-18 NOTE — Progress Notes (Signed)
? ?Patient Care Team: ?Venia Carbon, MD as PCP - General ?Burnell Blanks, MD as PCP - Cardiology (Cardiology) ?Liliane Shi PA-C as Physician Assistant (Physician Assistant) ? ?DIAGNOSIS:  ?Encounter Diagnosis  ?Name Primary?  ? Iron deficiency anemia due to chronic blood loss   ? ? ?CHIEF COMPLIANT: Follow-up of iron deficiency anemia requiring blood transfusions ? ?INTERVAL HISTORY: Chelsea Keller is a 87 year old with above-mentioned survive deficiency anemia who cannot tolerate any form of intravenous iron therapy and requires periodic blood transfusions for severe anemia.  She is here for routine follow-up.  She received blood transfusion recently but tells me that she has not noticed any improvement in her symptoms.  Her major complaint today is severe itching diffusely throughout her body which has been an ongoing problem for several years.  Sometimes she scratches so hard that she bleeds easily. ? ? ?ALLERGIES:  is allergic to codeine sulfate, celecoxib, erythromycin base, and spironolactone. ? ?MEDICATIONS:  ?Current Outpatient Medications  ?Medication Sig Dispense Refill  ? acetaminophen (TYLENOL) 500 MG tablet Take 1,000-1,500 mg by mouth every 6 (six) hours as needed for moderate pain or headache (pain).     ? allopurinol (ZYLOPRIM) 100 MG tablet Take 1 tablet (100 mg total) by mouth daily. 90 tablet 3  ? allopurinol (ZYLOPRIM) 300 MG tablet Take 1 tablet (300 mg total) by mouth daily. 90 tablet 3  ? colchicine 0.6 MG tablet Take 1 tablet (0.6 mg total) by mouth 2 (two) times daily. 60 tablet 11  ? Crisaborole (EUCRISA) 2 % OINT Apply to skin daily 60 g 4  ? ELIQUIS 2.5 MG TABS tablet TAKE 1 TABLET TWICE A DAY 180 tablet 3  ? fluconazole (DIFLUCAN) 150 MG tablet Take 150 mg by mouth once.    ? gabapentin (NEURONTIN) 300 MG capsule Take 1 capsule in the morning and 2 capsules at bedtime 270 capsule 3  ? hydrOXYzine (ATARAX) 10 MG tablet Take 1 tablet (10 mg total) by mouth 3 (three)  times daily as needed. 90 tablet 0  ? KLOR-CON M20 20 MEQ tablet TAKE 2 TABLETS (40 MEQ TOTAL) DAILY 180 tablet 3  ? ondansetron (ZOFRAN) 4 MG tablet Take 1 tablet (4 mg total) by mouth every 8 (eight) hours as needed for nausea or vomiting. 20 tablet 0  ? tiZANidine (ZANAFLEX) 2 MG tablet     ? torsemide (DEMADEX) 20 MG tablet Take 20 mg by mouth as needed.    ? traMADol (ULTRAM) 50 MG tablet Take 1 tablet (50 mg total) by mouth 3 (three) times daily as needed for moderate pain. 60 tablet 0  ? triamterene-hydrochlorothiazide (MAXZIDE-25) 37.5-25 MG tablet Take 1 tablet by mouth daily.    ? ?No current facility-administered medications for this visit.  ? ? ?PHYSICAL EXAMINATION: ?ECOG PERFORMANCE STATUS: 1 - Symptomatic but completely ambulatory ? ?Vitals:  ? 11/18/21 1213  ?BP: (!) 177/57  ?Pulse: 91  ?Resp: 19  ?Temp: 97.7 ?F (36.5 ?C)  ?SpO2: 98%  ? ?Filed Weights  ? 11/18/21 1213  ?Weight: 267 lb 9.6 oz (121.4 kg)  ?  ? ?LABORATORY DATA:  ?I have reviewed the data as listed ? ?  Latest Ref Rng & Units 09/16/2021  ?  3:18 PM 08/14/2021  ?  3:06 PM 01/14/2021  ? 12:35 PM  ?CMP  ?Glucose 70 - 99 mg/dL 104   165   110    ?BUN 8 - 23 mg/dL '15   18   18    '$ ?  Creatinine 0.44 - 1.00 mg/dL 1.05   1.02   1.01    ?Sodium 135 - 145 mmol/L 139   138   140    ?Potassium 3.5 - 5.1 mmol/L 4.6   4.2   4.0    ?Chloride 98 - 111 mmol/L 105   102   104    ?CO2 22 - 32 mmol/L '27   29   27    '$ ?Calcium 8.9 - 10.3 mg/dL 9.0   9.0   9.4    ?Total Protein 6.5 - 8.1 g/dL 5.6   5.9     ?Total Bilirubin 0.3 - 1.2 mg/dL 0.7   1.2     ?Alkaline Phos 38 - 126 U/L 97   106     ?AST 15 - 41 U/L 21   19     ?ALT 0 - 44 U/L 11   14     ? ? ?Lab Results  ?Component Value Date  ? WBC 6.6 11/17/2021  ? HGB 9.5 (L) 11/17/2021  ? HCT 32.0 (L) 11/17/2021  ? MCV 80.6 11/17/2021  ? PLT 311 11/17/2021  ? NEUTROABS 3.8 11/17/2021  ? ? ?ASSESSMENT & PLAN:  ?Iron deficiency anemia due to chronic blood loss ?Hospitalization 08/11/2018: Severe GI bleed hemoglobin 4.9  (internal hemorrhoids and angiodysplasia of the colon)Received blood transfusions ?IV iron treatment: September 2019, March 2020, Injectafer June 2020, Lupita Dawn January 2021  ?  ?Toxicities of iron infusion:Patient gets profound diarrhea which last for 2 to 3 days after Feraheme (and prescription for Lomotil for 3 days).  With Venofer she did okay but she still had profound diarrhea as well as lethargic ?  ?10/10/2019: Hemoglobin 6.8: MCV 82, RDW 18: 2 units of PRBC given on 10/11/2019. ?11/20/2019: Hemoglobin 7.1, MCV 81.2 Iron studies ferritin 7, iron saturation 6%, absolute reticulocyte count 88.3 ?12/26/2020: Hemoglobin 7.9 (2 units PRBC) ?08/14/2021: Hemoglobin 10.5, MCV 78.4, iron saturation 27%, ferritin 14 ?09/16/2021: Hemoglobin 9.8, MCV 81.3 ?11/17/2021: Hemoglobin 9.5, MCV 80.6, RDW 18.1 ?  ?Blood transfusions: 11/29/2019, 01/02/2020, October 2021, April 2022, 12/26/2020, 11/12/2021 ?  ?She does not want to receive Aranesp injections.    ?Because of her severe intolerance with the IV iron we are unable to do so.  She is also intolerant to oral iron therapy. ?  ?Current treatment: Observation and blood transfusions as needed for hemoglobin less than 7 g ?  ?Return to clinic in 3 months with labs and blood transfusion if needed. ?I will see her back in 6 months. ? ? ?Orders Placed This Encounter  ?Procedures  ? CBC with Differential ([place] Only)  ?  Standing Status:   Standing  ?  Number of Occurrences:   30  ?  Standing Expiration Date:   11/19/2022  ? Ferritin  ?  Standing Status:   Standing  ?  Number of Occurrences:   30  ?  Standing Expiration Date:   11/19/2022  ? Iron and Iron Binding Capacity ([facility] only)  ?  Standing Status:   Standing  ?  Number of Occurrences:   30  ?  Standing Expiration Date:   11/19/2022  ? Sample to Blood Bank  ?  Standing Status:   Standing  ?  Number of Occurrences:   100  ?  Standing Expiration Date:   11/19/2022  ?  Order Specific Question:   Release to patient  ?  Answer:    Immediate  ? ?The patient has a good understanding of the  overall plan. she agrees with it. she will call with any problems that may develop before the next visit here. ?Total time spent: 30 mins including face to face time and time spent for planning, charting and co-ordination of care ? ? Harriette Ohara, MD ?11/18/21 ? ? ? ?

## 2021-12-07 ENCOUNTER — Encounter: Payer: Self-pay | Admitting: Internal Medicine

## 2021-12-08 NOTE — Telephone Encounter (Signed)
Noted, agree with plan.

## 2021-12-08 NOTE — Telephone Encounter (Signed)
Noted, agree with need for OV prior to Dr. Silvio Pate returning

## 2021-12-08 NOTE — Telephone Encounter (Signed)
Unable to reach [place] by phone and left v/m requesting cb to [facility].

## 2021-12-08 NOTE — Telephone Encounter (Signed)
I spoke with [name] (DPR signed) and she is running behind and is just now on her way to ck on pt I advised that Dr Einar Pheasant agreed pt needed to be seen. There are no available appts left at [facility] today and [name] said if she took pt to UC she thought they would send her to ED so [name] is on her way home to talk with pt about going to ED and [name] will cb with update. Sending note to Dr Einar Pheasant and Adonis Brook CMA.

## 2021-12-08 NOTE — Telephone Encounter (Signed)
I spoke with Chelsea Keller (DPR signed) starting 1 wk ago pt has had pain in left calf and now the pain is at pain level 10 and Chelsea Keller said pain is so bad she cannot touch it. There is no redness or swelling and pt has not missed taking Eliquis. Pt has hx of pulmonary embolis. Starting on 12/04/21 pt started with fever and chills; fever ranged from 99 to 101.4 which occurred on 12/06/21. Pt has been have chills as well. Chelsea Keller did not ck pts temp this morning before Chelsea Keller went to work because pt was sleeping. No CP, SOB,H/A, dizziness,or cough. No covid test has been done but Chelsea Keller will ck covid test at 1 PM today when she sees pt. Chelsea Keller said pt will not want to go to [facility] or ED and only wants to see Dr Chelsea Keller. Explained that Dr Chelsea Keller is out of office this week and pt needs to be eval. Offered to speak with provider in office about appt but Chelsea Keller said she would speak with her mom at lunch and then decide what to do about being seen. UC & ED precautions given and pt voiced understanding. Dr Chelsea Keller is out of office and will send note to Dr Chelsea Keller for review and Chelsea Keller CMA. Will teams [place] as well.

## 2021-12-09 ENCOUNTER — Emergency Department (HOSPITAL_COMMUNITY)
Admission: EM | Admit: 2021-12-09 | Discharge: 2021-12-09 | Disposition: A | Discharge status: Home / Self Care | Payer: Medicare Other | Attending: Emergency Medicine | Admitting: Emergency Medicine

## 2021-12-09 ENCOUNTER — Other Ambulatory Visit: Payer: Self-pay | Admitting: Internal Medicine

## 2021-12-09 ENCOUNTER — Other Ambulatory Visit: Payer: Self-pay

## 2021-12-09 ENCOUNTER — Encounter (HOSPITAL_COMMUNITY): Payer: Self-pay

## 2021-12-09 ENCOUNTER — Emergency Department (HOSPITAL_BASED_OUTPATIENT_CLINIC_OR_DEPARTMENT_OTHER): Admit: 2021-12-09 | Discharge: 2021-12-09 | Disposition: A | Discharge status: Home / Self Care | Payer: Medicare Other

## 2021-12-09 DIAGNOSIS — M7989 Other specified soft tissue disorders: Secondary | ICD-10-CM | POA: Diagnosis not present

## 2021-12-09 DIAGNOSIS — M79602 Pain in left arm: Secondary | ICD-10-CM

## 2021-12-09 DIAGNOSIS — L089 Local infection of the skin and subcutaneous tissue, unspecified: Secondary | ICD-10-CM

## 2021-12-09 DIAGNOSIS — M79662 Pain in left lower leg: Secondary | ICD-10-CM | POA: Diagnosis not present

## 2021-12-09 DIAGNOSIS — Z7901 Long term (current) use of anticoagulants: Secondary | ICD-10-CM | POA: Diagnosis not present

## 2021-12-09 DIAGNOSIS — Z86718 Personal history of other venous thrombosis and embolism: Secondary | ICD-10-CM | POA: Insufficient documentation

## 2021-12-09 DIAGNOSIS — R197 Diarrhea, unspecified: Secondary | ICD-10-CM | POA: Insufficient documentation

## 2021-12-09 LAB — BASIC METABOLIC PANEL WITH GFR
Anion gap: 9 (ref 5–15)
BUN: 16 mg/dL (ref 8–23)
CO2: 26 mmol/L (ref 22–32)
Calcium: 9.3 mg/dL (ref 8.9–10.3)
Chloride: 104 mmol/L (ref 98–111)
Creatinine, Ser: 1.14 mg/dL — ABNORMAL HIGH (ref 0.44–1.00)
GFR, Estimated: 47 mL/min — ABNORMAL LOW (ref >60)
Glucose, Bld: 89 mg/dL (ref 70–99)
Potassium: 4.1 mmol/L (ref 3.5–5.1)
Sodium: 139 mmol/L (ref 135–145)

## 2021-12-09 LAB — CBC WITH DIFFERENTIAL/PLATELET
Abs Immature Granulocytes: 0.02 10*3/uL (ref 0.00–0.07)
Basophils Absolute: 0.1 10*3/uL (ref 0.0–0.1)
Basophils Relative: 1 %
Eosinophils Absolute: 1 10*3/uL — ABNORMAL HIGH (ref 0.0–0.5)
Eosinophils Relative: 15 %
HCT: 31.8 % — ABNORMAL LOW (ref 36.0–46.0)
Hemoglobin: 9.2 g/dL — ABNORMAL LOW (ref 12.0–15.0)
Immature Granulocytes: 0 %
Lymphocytes Relative: 15 %
Lymphs Abs: 1 10*3/uL (ref 0.7–4.0)
MCH: 23.4 pg — ABNORMAL LOW (ref 26.0–34.0)
MCHC: 28.9 g/dL — ABNORMAL LOW (ref 30.0–36.0)
MCV: 80.7 fL (ref 80.0–100.0)
Monocytes Absolute: 0.8 10*3/uL (ref 0.1–1.0)
Monocytes Relative: 12 %
Neutro Abs: 3.9 10*3/uL (ref 1.7–7.7)
Neutrophils Relative %: 57 %
Platelets: 325 10*3/uL (ref 150–400)
RBC: 3.94 MIL/uL (ref 3.87–5.11)
RDW: 18.7 % — ABNORMAL HIGH (ref 11.5–15.5)
WBC: 6.9 10*3/uL (ref 4.0–10.5)
nRBC: 0 % (ref 0.0–0.2)

## 2021-12-09 LAB — PROTIME-INR
INR: 1.3 — ABNORMAL HIGH (ref 0.8–1.2)
Prothrombin Time: 15.9 s — ABNORMAL HIGH (ref 11.4–15.2)

## 2021-12-09 MED ORDER — AMOXICILLIN-POT CLAVULANATE 875-125 MG PO TABS: 1.0000 | ORAL_TABLET | Freq: Two times a day (BID) | ORAL | 0 refills | Status: DC

## 2021-12-09 MED ORDER — AMOXICILLIN-POT CLAVULANATE 875-125 MG PO TABS
1.0000 | ORAL_TABLET | Freq: Once | ORAL | Status: AC
  Administered 2021-12-09: 1 via ORAL
  Filled 2021-12-09: qty 1

## 2021-12-09 NOTE — ED Triage Notes (Signed)
Pt c/o L leg pain and swelling for approximately one month. Pt has hx of blood clots, anticoagulated on Eliquis. Pt denies CP/SOB.

## 2021-12-09 NOTE — Discharge Instructions (Signed)
The testing today indicates that you have a likely wound infection of the soft tissues of the left calf.  To help this we are prescribing an antibiotic.  Pick up the prescription and start taking it tomorrow.  We gave you the first dose tonight.  Also to help the affected area, elevate your left leg above your heart is much as possible.  Additionally use a warm compress on it for 5 times a day for 40 minutes.  Follow-up with your doctor for checkup within 1 week.  Return here if needed for worsening symptoms

## 2021-12-09 NOTE — Telephone Encounter (Signed)
Patient's daughter is calling in temp was 34, if leg pain does come back then will go to ED. Does have an appointment on Monday.

## 2021-12-09 NOTE — Telephone Encounter (Signed)
Noted, would still recommend earlier evaluation. But if unwilling to go it is reassuring her fever has resolved

## 2021-12-09 NOTE — Telephone Encounter (Signed)
I spoke with Cindy(DPR signed) and notified as instructed by Dr Einar Pheasant. [name] voiced understanding and said said that she and the pt were at [facility] ED now and pt is getting triaged now;Cindy said that pts leg started bothering her more and then she had a bad stinging sensation in the leg so the pt asked to go to ED. Sending note as FYI to Dr Einar Pheasant.

## 2021-12-09 NOTE — Progress Notes (Signed)
Left lower extremity venous duplex has been completed. Preliminary results can be found in CV Proc through chart review.  Results were given to [facility] PA.  12/09/21 3:56 PM Carlos Levering RVT

## 2021-12-09 NOTE — ED Provider Triage Note (Signed)
Emergency Medicine Provider Triage Evaluation Note  Chelsea Keller , a 87 y.o. female  was evaluated in triage.  Pt complains of left lower extremity pain and swelling.  Began 1 month ago.  History of recurrent DVT.  She is chronically anticoagulated on Eliquis.  Has not missed any doses.  Patient also notes that she struggles with psoriasis and lower extremity swelling at baseline.  States prior to symptoms starting she had "the worst psoriasis outbreak I have ever had."  Then noted some swelling and redness to her left lower extremity.  She has no chest pain, shortness of breath or fevers.  Review of Systems  Positive: Left lower extremity swelling, redness Negative:   Physical Exam  BP (!) 171/59 (BP Location: Left Arm)   Pulse 83   Temp 98.4 F (36.9 C) (Oral)   Resp 20   SpO2 99%  Gen:   Awake, no distress   Resp:  Normal effort  Cardiac: 1+ DP pulse Bl MSK:   Moves extremities without difficulty, tenderness, swelling to left lower extremity calf distally.  Surrounding erythema, some induration to skin.  Multiple excoriations, psoriatic  lesions Other:    Medical Decision Making  Medically screening exam initiated at 3:25 PM.  Appropriate orders placed.  Carmyn Hamm was informed that the remainder of the evaluation will be completed by another provider, this initial triage assessment does not replace that evaluation, and the importance of remaining in the ED until their evaluation is complete.  LLE pain, swelling   Daizy Outen A, PA-C 12/09/21 1531

## 2021-12-09 NOTE — ED Provider Notes (Signed)
[facility] DEPT Provider Note   CSN: [id number] Arrival date & time: 12/09/21  1505     History  Chief Complaint  Patient presents with   Leg Swelling    Chelsea Keller is a 87 y.o. female.  HPI Patient presenting for evaluation of left lower leg pain with swelling which began a month ago.  She has a history of recurrent DVT and is currently on Eliquis.  She has not missed any doses.  She has not history of iron deficiency anemia, last saw her oncologist for that 11/18/2021.  She reportedly has chronic pruritus.  She has history of GI bleeding requiring multiple transfusions.  She receives iron transfusions, but typically has diarrhea as a side effect.     Home Medications Prior to Admission medications   Medication Sig Start Date End Date Taking? Authorizing Provider  allopurinol (ZYLOPRIM) 100 MG tablet Take 1 tablet (100 mg total) by mouth daily. 11/12/21  Yes Venia Carbon, MD  allopurinol (ZYLOPRIM) 300 MG tablet Take 1 tablet (300 mg total) by mouth daily. 11/12/21  Yes Venia Carbon, MD  amoxicillin-clavulanate (AUGMENTIN) 875-125 MG tablet Take 1 tablet by mouth every 12 (twelve) hours. 12/09/21  Yes Daleen Bo, MD  colchicine 0.6 MG tablet Take 1 tablet (0.6 mg total) by mouth 2 (two) times daily. 11/05/20  Yes Venia Carbon, MD  Crisaborole Georga Hacking) 2 % OINT Apply to skin daily 11/18/20  Yes Ralene Bathe, MD  ELIQUIS 2.5 MG TABS tablet TAKE 1 TABLET TWICE A DAY Patient taking differently: Take 2.5 mg by mouth 2 (two) times daily. 02/12/21  Yes Venia Carbon, MD  gabapentin (NEURONTIN) 300 MG capsule Take 1 capsule in the morning and 2 capsules at bedtime Patient taking differently: Take 300-600 mg by mouth See admin instructions. Take 1 capsule in the morning and 2 capsules at bedtime 07/22/21  Yes Venia Carbon, MD  hydrOXYzine (ATARAX) 10 MG tablet Take 1 tablet (10 mg total) by mouth 3 (three) times daily as  needed. Patient taking differently: Take 10 mg by mouth 3 (three) times daily as needed for itching. 06/10/21  Yes Viviana Simpler I, MD  KLOR-CON M20 20 MEQ tablet TAKE 2 TABLETS (40 MEQ TOTAL) DAILY Patient taking differently: Take 40 mEq by mouth daily. 05/05/21  Yes Venia Carbon, MD  ondansetron (ZOFRAN) 4 MG tablet Take 1 tablet (4 mg total) by mouth every 8 (eight) hours as needed for nausea or vomiting. 07/19/20  Yes Venia Carbon, MD  torsemide (DEMADEX) 20 MG tablet Take 20 mg by mouth daily as needed (for fluid).   Yes [provider]  traMADol (ULTRAM) 50 MG tablet Take 1 tablet (50 mg total) by mouth 3 (three) times daily as needed for moderate pain. 11/12/21  Yes Venia Carbon, MD  triamterene-hydrochlorothiazide (MAXZIDE-25) 37.5-25 MG tablet Take 1 tablet by mouth daily. 07/18/21  Yes [provider]  acetaminophen (TYLENOL) 500 MG tablet Take 1,000-1,500 mg by mouth every 6 (six) hours as needed for moderate pain or headache (pain).     [provider]  fluconazole (DIFLUCAN) 150 MG tablet Take 150 mg by mouth once. 02/05/21   [provider]      Allergies    Codeine sulfate, Celecoxib, Erythromycin base, and Spironolactone    Review of Systems   Review of Systems  Physical Exam Updated Vital Signs BP (!) 151/69   Pulse 85   Temp 98.4 F (36.9 C) (  Oral)   Resp 18   SpO2 98%  Physical Exam Vitals and nursing note reviewed.  Constitutional:      General: She is not in acute distress.    Appearance: She is well-developed. She is obese. She is not ill-appearing, toxic-appearing or diaphoretic.  HENT:     Head: Normocephalic and atraumatic.     Right Ear: External ear normal.     Left Ear: External ear normal.  Eyes:     Conjunctiva/sclera: Conjunctivae normal.     Pupils: Pupils are equal, round, and reactive to light.  Neck:     Trachea: Phonation normal.  Cardiovascular:     Rate and Rhythm: Normal rate.  Pulmonary:      Effort: Pulmonary effort is normal.  Abdominal:     General: There is no distension.  Musculoskeletal:        General: Normal range of motion.     Cervical back: Normal range of motion and neck supple.  Skin:    General: Skin is warm and dry.     Comments: Generalized excoriated rash with innumerable small tiny red areas.  No associated drainage or bleeding.  No vesicles or petechiae.  Left medial calf with an area of localized induration approximately 5 x 10 cm, which is tender and slightly warm to touch.  Overlying this are 2 of the generalized rash.  This area is not fluctuant and there is no associated drainage or bleeding.  There is no proximal streaking.  Neurological:     Mental Status: She is alert and oriented to person, place, and time.     Cranial Nerves: No cranial nerve deficit.     Sensory: No sensory deficit.     Motor: No abnormal muscle tone.     Coordination: Coordination normal.  Psychiatric:        Mood and Affect: Mood normal.        Behavior: Behavior normal.        Thought Content: Thought content normal.        Judgment: Judgment normal.    ED Results / Procedures / Treatments   Labs (all labs ordered are listed, but only abnormal results are displayed) Labs Reviewed  CBC WITH DIFFERENTIAL/PLATELET - Abnormal; Notable for the following components:      Result Value   Hemoglobin 9.2 (*)    HCT 31.8 (*)    MCH 23.4 (*)    MCHC 28.9 (*)    RDW 18.7 (*)    Eosinophils Absolute 1.0 (*)    All other components within normal limits  BASIC METABOLIC PANEL - Abnormal; Notable for the following components:   Creatinine, Ser 1.14 (*)    GFR, Estimated 47 (*)    All other components within normal limits  PROTIME-INR - Abnormal; Notable for the following components:   Prothrombin Time 15.9 (*)    INR 1.3 (*)    All other components within normal limits    EKG None  Radiology VAS [place] LOWER EXTREMITY VENOUS (DVT) (ONLY [facility] & [facility])  Result Date: 12/09/2021   Lower Venous DVT Study Patient Name:  Chelsea Keller  Date of Exam:   12/09/2021 Medical Rec #: [id number]      Accession #:    [id number] Date of Birth: 1935-02-27      Patient Gender: F Patient Age:   86 years Exam Location:  [facility] Procedure:      VAS [place] LOWER EXTREMITY VENOUS (DVT) Referring Phys: BRITNI HENDERLY --------------------------------------------------------------------------------  Indications: Pain.  Risk Factors: None identified. Limitations: Body habitus, poor ultrasound/tissue interface and patient positioning, patient pain tolerance. Comparison Study: No prior studies. Performing Technologist: Oliver Hum RVT  Examination Guidelines: A complete evaluation includes B-mode imaging, spectral Doppler, color Doppler, and power Doppler as needed of all accessible portions of each vessel. Bilateral testing is considered an integral part of a complete examination. Limited examinations for reoccurring indications may be performed as noted. The reflux portion of the exam is performed with the patient in reverse Trendelenburg.  +-----+---------------+---------+-----------+----------+--------------+ RIGHTCompressibilityPhasicitySpontaneityPropertiesThrombus Aging +-----+---------------+---------+-----------+----------+--------------+ CFV  Full           Yes      Yes                                 +-----+---------------+---------+-----------+----------+--------------+   +---------+---------------+---------+-----------+----------+-------------------+ LEFT     CompressibilityPhasicitySpontaneityPropertiesThrombus Aging      +---------+---------------+---------+-----------+----------+-------------------+ CFV      Full           Yes      Yes                                      +---------+---------------+---------+-----------+----------+-------------------+ SFJ      Full                                                              +---------+---------------+---------+-----------+----------+-------------------+ FV Prox  Full                                                             +---------+---------------+---------+-----------+----------+-------------------+ FV Mid                  Yes      Yes                                      +---------+---------------+---------+-----------+----------+-------------------+ FV Distal               Yes      Yes                                      +---------+---------------+---------+-----------+----------+-------------------+ PFV      Full                                                             +---------+---------------+---------+-----------+----------+-------------------+ POP      Full           Yes      Yes                                      +---------+---------------+---------+-----------+----------+-------------------+  PTV      Full                                                             +---------+---------------+---------+-----------+----------+-------------------+ PERO                                                  Not well visualized +---------+---------------+---------+-----------+----------+-------------------+    Summary: RIGHT: - No evidence of common femoral vein obstruction.  LEFT: - There is no evidence of deep vein thrombosis in the lower extremity. However, portions of this examination were limited- see technologist comments above.  - No cystic structure found in the popliteal fossa.  *See table(s) above for measurements and observations.    Preliminary     Procedures Procedures    Medications Ordered in ED Medications  amoxicillin-clavulanate (AUGMENTIN) 875-125 MG per tablet 1 tablet (has no administration in time range)    ED Course/ Medical Decision Making/ A&P                           Medical Decision Making Patient with chronic pruritus and generalized rash presenting with left lower leg swelling and  discomfort.  The area is localized to the left medial upper calf.  It is indurated with mild erythema.  Overlying this are 2 open areas consistent with a rash on other parts of her body.  It is unclear what is causing the local indurated and red area.  Differential diagnosis includes local allergic reaction and early cellulitis.  Problems Addressed: Wound infection: acute illness or injury    Details: Will cover for infection, to treat the affected area of the left calf.  Amount and/or Complexity of Data Reviewed Independent Historian:     Details: She is a cogent historian Labs: ordered.    Details: CBC, metabolic panel-normal except hemoglobin low, creatinine high, GFR low  Risk Prescription drug management. Decision regarding hospitalization. Risk Details: Patient with discomfort of the left leg with visible palpable abnormality.  This area may be a localized allergic reaction, versus early cellulitis.  Clinical exam and laboratory testing is reassuring.  Will cover with antibiotics, treat symptomatically and advise close follow-up.  She has a follow-up appointment with her PCP in 6 days.  Advised her to keep that appointment return here if needed and work on aggressive elevation and warm compresses.  Augmentin started in the ED and prescription sent to her pharmacy.  She does not require hospitalization at this time.  Doubt sepsis, deep tissue infection or significant metabolic disorder.           Final Clinical Impression(s) / ED Diagnoses Final diagnoses:  Wound infection    Rx / DC Orders ED Discharge Orders          Ordered    amoxicillin-clavulanate (AUGMENTIN) 875-125 MG tablet  Every 12 hours        12/09/21 Valerie Roys              Daleen Bo, MD 12/09/21 [id number]

## 2021-12-11 NOTE — Telephone Encounter (Signed)
Pt daughter called and asked if Dr Einar Pheasant could fill since Dr Silvio Pate is out

## 2021-12-15 ENCOUNTER — Ambulatory Visit (INDEPENDENT_AMBULATORY_CARE_PROVIDER_SITE_OTHER): Payer: Medicare Other | Admitting: Internal Medicine

## 2021-12-15 ENCOUNTER — Encounter: Payer: Self-pay | Admitting: Internal Medicine

## 2021-12-15 DIAGNOSIS — L299 Pruritus, unspecified: Secondary | ICD-10-CM | POA: Diagnosis not present

## 2021-12-15 DIAGNOSIS — M79605 Pain in left leg: Secondary | ICD-10-CM | POA: Diagnosis not present

## 2021-12-15 NOTE — Patient Instructions (Signed)
Please change to a moisturizing soap. Try fexofenadine 180 at bedtime to help with the itching

## 2021-12-15 NOTE — Assessment & Plan Note (Signed)
No DVT seen Very sensitive---likely part of her pruritic disorder

## 2021-12-15 NOTE — Progress Notes (Signed)
Subjective:    Patient ID: Chelsea Keller, female    DOB: [birth year], 87 y.o.   MRN: [id number]  HPI Here with daughter for ER follow up Was seen 6/6  Spiked a fever off and an--last weekend. Max 101.4 Ongoing pain in left calf--that got worse Ultrasound done---no DVT Given augmentin for presumed cellulitis It has helped---but still having pain and is tender (she feels this goes back for months)  Current Outpatient Medications on File Prior to Visit  Medication Sig Dispense Refill   acetaminophen (TYLENOL) 500 MG tablet Take 1,000-1,500 mg by mouth every 6 (six) hours as needed for moderate pain or headache (pain).      allopurinol (ZYLOPRIM) 100 MG tablet Take 1 tablet (100 mg total) by mouth daily. 90 tablet 3   allopurinol (ZYLOPRIM) 300 MG tablet Take 1 tablet (300 mg total) by mouth daily. 90 tablet 3   amoxicillin-clavulanate (AUGMENTIN) 875-125 MG tablet Take 1 tablet by mouth every 12 (twelve) hours. 14 tablet 0   colchicine 0.6 MG tablet Take 1 tablet (0.6 mg total) by mouth 2 (two) times daily. 60 tablet 11   Crisaborole (EUCRISA) 2 % OINT Apply to skin daily 60 g 4   ELIQUIS 2.5 MG TABS tablet TAKE 1 TABLET TWICE A DAY (Patient taking differently: Take 2.5 mg by mouth 2 (two) times daily.) 180 tablet 3   fluconazole (DIFLUCAN) 150 MG tablet Take 150 mg by mouth once.     gabapentin (NEURONTIN) 300 MG capsule Take 1 capsule in the morning and 2 capsules at bedtime (Patient taking differently: Take 300-600 mg by mouth See admin instructions. Take 1 capsule in the morning and 2 capsules at bedtime) 270 capsule 3   hydrOXYzine (ATARAX) 10 MG tablet TAKE 1 TABLET BY MOUTH THREE TIMES A DAY AS NEEDED 90 tablet 0   KLOR-CON M20 20 MEQ tablet TAKE 2 TABLETS (40 MEQ TOTAL) DAILY (Patient taking differently: Take 40 mEq by mouth daily.) 180 tablet 3   ondansetron (ZOFRAN) 4 MG tablet Take 1 tablet (4 mg total) by mouth every 8 (eight) hours as needed for nausea or vomiting. 20 tablet 0    torsemide (DEMADEX) 20 MG tablet Take 20 mg by mouth daily as needed (for fluid).     traMADol (ULTRAM) 50 MG tablet Take 1 tablet (50 mg total) by mouth 3 (three) times daily as needed for moderate pain. 60 tablet 0   triamterene-hydrochlorothiazide (MAXZIDE-25) 37.5-25 MG tablet Take 1 tablet by mouth daily.     No current facility-administered medications on file prior to visit.    Allergies  Allergen Reactions   Codeine Sulfate Shortness Of Breath   Celecoxib Other (See Comments)    Caused vaginal bleeding   Erythromycin Base Nausea And Vomiting   Spironolactone Nausea Only and Other (See Comments)    Blurred vision, Upset stomach, lethargy    Past Medical History:  Diagnosis Date   Allergy    Anemia    Anxiety    Arthritis    Arthritis of sacroiliac joint of both sides 11/12/2017   Bilateral pulmonary embolism (HCC) 06/23/2017   Chronic diastolic CHF (congestive heart failure) ([place])    a. Echo 1/16:  mild LVH, EF normal, grade 1 DD, MAC   Chronic venous insufficiency    chronic LE edema   DDD (degenerative disc disease), lumbar 11/12/2017   Degenerative joint disease (DJD) of hip, Bilateral  11/12/2017   Depression    Fibromyalgia    constant pain  Hx of cardiac catheterization    a. LHC in [place] "ok" per patient with mild plaque in a single vessel - records not available   Hx of cardiovascular stress test    a. Nuclear study in 2008 normal   Hx of colonic polyps    Hypertension    Hypertriglyceridemia    Impaired fasting glucose    PONV (postoperative nausea and vomiting)    PUD (peptic ulcer disease)    hx of gastric ulcer   Pulmonary emboli ([place]) 06/2017   Vitamin B12 deficiency     Past Surgical History:  Procedure Laterality Date   ABDOMINAL HYSTERECTOMY     CATARACT EXTRACTION  10/2003   OD   CHOLECYSTECTOMY     COLONOSCOPY W/ POLYPECTOMY     COLONOSCOPY WITH PROPOFOL N/A 10/15/2014   Procedure: COLONOSCOPY WITH PROPOFOL;  Surgeon: Ladene Artist, MD;   Location: [facility] ENDOSCOPY;  Service: Endoscopy;  Laterality: N/A;   COLONOSCOPY WITH PROPOFOL N/A 03/23/2018   Procedure: COLONOSCOPY WITH PROPOFOL;  Surgeon: Gatha Mayer, MD;  Location: [facility] ENDOSCOPY;  Service: Endoscopy;  Laterality: N/A;   ESOPHAGOGASTRODUODENOSCOPY (EGD) WITH PROPOFOL N/A 10/15/2014   Procedure: ESOPHAGOGASTRODUODENOSCOPY (EGD) WITH PROPOFOL;  Surgeon: Ladene Artist, MD;  Location: [facility] ENDOSCOPY;  Service: Endoscopy;  Laterality: N/A;   EXTERNAL FIXATION ANKLE FRACTURE     Fx.  left ankle-fixation with pins later removed sec to infection [place]   left with pins   EYE SURGERY     FEMUR FRACTURE SURGERY  06/2009   FRACTURE SURGERY     HARDWARE REMOVAL Left 10/05/2013   Procedure: HARDWARE REMOVAL LEFT DISTAL FEMUR;  Surgeon: Rozanna Box, MD;  Location: [facility];  Service: Orthopedics;  Laterality: Left;   HEMIARTHROPLASTY SHOULDER FRACTURE  06/2009   HOT HEMOSTASIS N/A 03/23/2018   Procedure: HOT HEMOSTASIS (ARGON PLASMA COAGULATION/BICAP);  Surgeon: Gatha Mayer, MD;  Location: [facility] ENDOSCOPY;  Service: Endoscopy;  Laterality: N/A;   JOINT REPLACEMENT     STERIOD INJECTION Right 10/05/2013   Procedure: STEROID INJECTION;  Surgeon: Rozanna Box, MD;  Location: [facility];  Service: Orthopedics;  Laterality: Right;   TONSILLECTOMY     TONSILLECTOMY     TOTAL KNEE ARTHROPLASTY  03/09   left    Family History  Problem Relation Age of Onset   Depression Mother    Cancer Mother        uterine cancer   Heart attack Father    Cancer Brother        prostate cancer   Diabetes Maternal Aunt    Arthritis Brother    Asthma Brother    Stroke Maternal Grandmother    Pulmonary embolism Daughter     Social History   Socioeconomic History   Marital status: Widowed    Spouse name: Not on file   Number of children: 4   Years of education: Not on file   Highest education level: Not on file  Occupational History   Occupation: retired  crossing guard   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  Tobacco Use   Smoking status: Former    Packs/day: 1.00    Years: 40.00    Total pack years: 40.00    Types: Cigarettes    Quit date: 07/06/1993    Years since quitting: 28.4   Smokeless tobacco: Never  Vaping Use   Vaping Use: Never used  Substance and Sexual Activity   Alcohol  use: No    Alcohol/week: 0.0 standard drinks of alcohol   Drug use: No   Sexual activity: Not Currently  Other Topics Concern   Not on file  Social History Narrative   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 [employer], lives w/ daughter and adult grandsons   Former smoker, no EtOH      No living will   Plans to do health care POA forms---wants daughter [name]   Would accept resuscitation attempts---no prolonged ventilation   Absolutely no feeding tube   Social Determinants of Health   Financial Resource Strain: Not on file  Food Insecurity: Not on file  Transportation Needs: Not on file  Physical Activity: Not on file  Stress: Not on file  Social Connections: Not on file  Intimate Partner Violence: Not on file   Review of Systems No N/V Eats fine Fever did go away     Objective:   Physical Exam Constitutional:      Appearance: Normal appearance.  Musculoskeletal:     Comments: Some calf swelling--fairly symmetric Tender only in posterior right calf Multiple excoriations on anterior left calf---no evidence of infection  Neurological:     Mental Status: She is alert.            Assessment & Plan:

## 2021-12-15 NOTE — Assessment & Plan Note (Addendum)
Ongoing issues Has tried multiple things from derm Nehemiah Massed) She still uses safeguard soap---urged her to change to moisturing soap (and moisturizers after bathing) Try allegra 180 at bedtime

## 2021-12-19 ENCOUNTER — Telehealth: Payer: Self-pay

## 2021-12-19 NOTE — Telephone Encounter (Signed)
Left a message for Chelsea Keller to see what she takes the hydroxyzine for. I need to know if we need to do the prior authorization.

## 2021-12-21 ENCOUNTER — Encounter: Payer: Self-pay | Admitting: Internal Medicine

## 2021-12-22 NOTE — Telephone Encounter (Signed)
OMBTDH:[id number];XMIWOE:[id number];Review Type:Prior Auth;Coverage Start Date:11/22/2021;Coverage End Date:12/22/2022;

## 2021-12-31 ENCOUNTER — Other Ambulatory Visit: Payer: Self-pay

## 2021-12-31 MED ORDER — APIXABAN 2.5 MG PO TABS
2.5000 mg | ORAL_TABLET | Freq: Two times a day (BID) | ORAL | 3 refills | Status: DC
Start: 2021-12-31 — End: 2023-01-21

## 2021-12-31 NOTE — Telephone Encounter (Signed)
Fax from [facility] for refill

## 2022-01-03 ENCOUNTER — Other Ambulatory Visit: Payer: Self-pay | Admitting: Internal Medicine

## 2022-01-17 ENCOUNTER — Encounter: Payer: Self-pay | Admitting: Hematology and Oncology

## 2022-01-19 ENCOUNTER — Other Ambulatory Visit: Payer: Self-pay

## 2022-01-19 DIAGNOSIS — D500 Iron deficiency anemia secondary to blood loss (chronic): Secondary | ICD-10-CM

## 2022-01-19 NOTE — Progress Notes (Signed)
S/w pt's daughter per [employer]. Daughter reports pt is experiencing weakness, ShOB and is concerned she needs a blood transfusion. Pt is scheduled to come in 7/18 at 0745 for labs then tentatively scheduled for 2 units PRBC. Ordered entered. Prepare will be released upon labs.

## 2022-01-20 ENCOUNTER — Inpatient Hospital Stay: Payer: Medicare Other | Attending: Hematology and Oncology

## 2022-01-20 ENCOUNTER — Inpatient Hospital Stay: Payer: Medicare Other

## 2022-01-20 ENCOUNTER — Other Ambulatory Visit: Payer: Self-pay

## 2022-01-20 DIAGNOSIS — D500 Iron deficiency anemia secondary to blood loss (chronic): Secondary | ICD-10-CM | POA: Insufficient documentation

## 2022-01-20 DIAGNOSIS — K922 Gastrointestinal hemorrhage, unspecified: Secondary | ICD-10-CM | POA: Diagnosis not present

## 2022-01-20 DIAGNOSIS — Z79899 Other long term (current) drug therapy: Secondary | ICD-10-CM | POA: Insufficient documentation

## 2022-01-20 LAB — CMP (CANCER CENTER ONLY)
ALT: 13 U/L (ref 0–44)
AST: 34 U/L (ref 15–41)
Albumin: 3.4 g/dL — ABNORMAL LOW (ref 3.5–5.0)
Alkaline Phosphatase: 121 U/L (ref 38–126)
Anion gap: 8 (ref 5–15)
BUN: 18 mg/dL (ref 8–23)
CO2: 26 mmol/L (ref 22–32)
Calcium: 9 mg/dL (ref 8.9–10.3)
Chloride: 105 mmol/L (ref 98–111)
Creatinine: 1.17 mg/dL — ABNORMAL HIGH (ref 0.44–1.00)
GFR, Estimated: 46 mL/min — ABNORMAL LOW (ref >60)
Glucose, Bld: 117 mg/dL — ABNORMAL HIGH (ref 70–99)
Potassium: 3.5 mmol/L (ref 3.5–5.1)
Sodium: 139 mmol/L (ref 135–145)
Total Bilirubin: 1 mg/dL (ref 0.3–1.2)
Total Protein: 5.6 g/dL — ABNORMAL LOW (ref 6.5–8.1)

## 2022-01-20 LAB — CBC WITH DIFFERENTIAL (CANCER CENTER ONLY)
Abs Immature Granulocytes: 0.02 10*3/uL (ref 0.00–0.07)
Basophils Absolute: 0.1 10*3/uL (ref 0.0–0.1)
Basophils Relative: 1 %
Eosinophils Absolute: 0.8 10*3/uL — ABNORMAL HIGH (ref 0.0–0.5)
Eosinophils Relative: 15 %
HCT: 24.4 % — ABNORMAL LOW (ref 36.0–46.0)
Hemoglobin: 7 g/dL — ABNORMAL LOW (ref 12.0–15.0)
Immature Granulocytes: 0 %
Lymphocytes Relative: 15 %
Lymphs Abs: 0.7 10*3/uL (ref 0.7–4.0)
MCH: 22 pg — ABNORMAL LOW (ref 26.0–34.0)
MCHC: 28.7 g/dL — ABNORMAL LOW (ref 30.0–36.0)
MCV: 76.7 fL — ABNORMAL LOW (ref 80.0–100.0)
Monocytes Absolute: 0.5 10*3/uL (ref 0.1–1.0)
Monocytes Relative: 10 %
Neutro Abs: 2.9 10*3/uL (ref 1.7–7.7)
Neutrophils Relative %: 59 %
Platelet Count: 293 10*3/uL (ref 150–400)
RBC: 3.18 MIL/uL — ABNORMAL LOW (ref 3.87–5.11)
RDW: 19.7 % — ABNORMAL HIGH (ref 11.5–15.5)
WBC Count: 5 10*3/uL (ref 4.0–10.5)
nRBC: 0 % (ref 0.0–0.2)

## 2022-01-20 LAB — SAMPLE TO BLOOD BANK

## 2022-01-20 LAB — IRON AND IRON BINDING CAPACITY (CC-WL,HP ONLY)
Iron: 23 ug/dL — ABNORMAL LOW (ref 28–170)
Saturation Ratios: 6 % — ABNORMAL LOW (ref 10.4–31.8)
TIBC: 416 ug/dL (ref 250–450)
UIBC: 393 ug/dL (ref 148–442)

## 2022-01-20 LAB — FERRITIN: Ferritin: 4 ng/mL — ABNORMAL LOW (ref 11–307)

## 2022-01-20 LAB — PREPARE RBC (CROSSMATCH)

## 2022-01-20 MED ORDER — ACETAMINOPHEN 325 MG PO TABS
650.0000 mg | ORAL_TABLET | Freq: Once | ORAL | Status: AC
  Administered 2022-01-20: 650 mg via ORAL
  Filled 2022-01-20: qty 2

## 2022-01-20 MED ORDER — DIPHENHYDRAMINE HCL 25 MG PO CAPS
25.0000 mg | ORAL_CAPSULE | Freq: Once | ORAL | Status: AC
  Administered 2022-01-20: 25 mg via ORAL
  Filled 2022-01-20: qty 1

## 2022-01-20 MED ORDER — SODIUM CHLORIDE 0.9% IV SOLUTION
250.0000 mL | Freq: Once | INTRAVENOUS | Status: AC
  Administered 2022-01-20: 250 mL via INTRAVENOUS

## 2022-01-20 NOTE — Patient Instructions (Signed)
Blood Transfusion, Adult A blood transfusion is a procedure in which you receive blood or a type of blood cell (blood component) through an IV. You may need a blood transfusion when your blood level is low. This may result from a bleeding disorder, illness, injury, or surgery. The blood may come from a donor. You may also be able to donate blood for yourself (autologous blood donation) before a planned surgery. The blood given in a transfusion is made up of different blood components. You may receive: Red blood cells. These carry oxygen to the cells in the body. Platelets. These help your blood to clot. Plasma. This is the liquid part of your blood. It carries proteins and other substances throughout the body. White blood cells. These help you fight infections. If you have hemophilia or another clotting disorder, you may also receive other types of blood products. Tell a health care provider about: Any blood disorders you have. Any previous reactions you have had during a blood transfusion. Any allergies you have. All medicines you are taking, including vitamins, herbs, eye drops, creams, and over-the-counter medicines. Any surgeries you have had. Any medical conditions you have, including any recent fever or cold symptoms. Whether you are pregnant or may be pregnant. What are the risks? Generally, this is a safe procedure. However, problems may occur. The most common problems include: A mild allergic reaction, such as red, swollen areas of skin (hives) and itching. Fever or chills. This may be the body's response to new blood cells received. This may occur during or up to 4 hours after the transfusion. More serious problems may include: Transfusion-associated circulatory overload (TACO), or too much fluid in the lungs. This may cause breathing problems. A serious allergic reaction, such as difficulty breathing or swelling around the face and lips. Transfusion-related acute lung injury  (TRALI), which causes breathing difficulty and low oxygen in the blood. This can occur within hours of the transfusion or several days later. Iron overload. This can happen after receiving many blood transfusions over a period of time. Infection or virus being transmitted. This is rare because donated blood is carefully tested before it is given. Hemolytic transfusion reaction. This is rare. It happens when your body's defense system (immune system)tries to attack the new blood cells. Symptoms may include fever, chills, nausea, low blood pressure, and low back or chest pain. Transfusion-associated graft-versus-host disease (TAGVHD). This is rare. It happens when donated cells attack your body's healthy tissues. What happens before the procedure? Medicines Ask your health care provider about: Changing or stopping your regular medicines. This is especially important if you are taking diabetes medicines or blood thinners. Taking medicines such as aspirin and ibuprofen. These medicines can thin your blood. Do not take these medicines unless your health care provider tells you to take them. Taking over-the-counter medicines, vitamins, herbs, and supplements. General instructions Follow instructions from your health care provider about eating and drinking restrictions. You will have a blood test to determine your blood type. This is necessary to know what kind of blood your body will accept and to match it to the donor blood. If you are going to have a planned surgery, you may be able to do an autologous blood donation. This may be done in case you need to have a transfusion. You will have your temperature, blood pressure, and pulse monitored before the transfusion. If you have had an allergic reaction to a transfusion in the past, you may be given medicine to help prevent   a reaction. This medicine may be given to you by mouth (orally) or through an IV. Set aside time for the blood transfusion. This  procedure generally takes 1-4 hours to complete. What happens during the procedure?  An IV will be inserted into one of your veins. The bag of donated blood will be attached to your IV. The blood will then enter through your vein. Your temperature, blood pressure, and pulse will be monitored regularly during the transfusion. This monitoring is done to detect early signs of a transfusion reaction. Tell your nurse right away if you have any of these symptoms during the transfusion: Shortness of breath or trouble breathing. Chest or back pain. Fever or chills. Hives or itching. If you have any signs or symptoms of a reaction, your transfusion will be stopped and you may be given medicine. When the transfusion is complete, your IV will be removed. Pressure may be applied to the IV site for a few minutes. A bandage (dressing)will be applied. The procedure may vary among health care providers and hospitals. What happens after the procedure? Your temperature, blood pressure, pulse, breathing rate, and blood oxygen level will be monitored until you leave the hospital or clinic. Your blood may be tested to see how you are responding to the transfusion. You may be warmed with fluids or blankets to maintain a normal body temperature. If you receive your blood transfusion in an outpatient setting, you will be told whom to contact to report any reactions. Where to find more information For more information on blood transfusions, visit the American [employer] Cross: redcross.org Summary A blood transfusion is a procedure in which you receive blood or a type of blood cell (blood component) through an IV. The blood you receive may come from a donor or be donated by yourself (autologous blood donation) before a planned surgery. The blood given in a transfusion is made up of different blood components. You may receive red blood cells, platelets, plasma, or white blood cells depending on the condition treated. Your  temperature, blood pressure, and pulse will be monitored before, during, and after the transfusion. After the transfusion, your blood may be tested to see how your body has responded. This information is not intended to replace advice given to you by your health care provider. Make sure you discuss any questions you have with your health care provider. Document Revised: 04/27/2019 Document Reviewed: 12/15/2018 Elsevier Patient Education  2023 Elsevier Inc.  

## 2022-01-21 LAB — BPAM RBC
Blood Product Expiration Date: 202308102359
Blood Product Expiration Date: 202308122359
ISSUE DATE / TIME: 202307180922
ISSUE DATE / TIME: 202307180922
Unit Type and Rh: 7300
Unit Type and Rh: 7300

## 2022-01-21 LAB — TYPE AND SCREEN
ABO/RH(D): B POS
Antibody Screen: NEGATIVE
Unit division: 0
Unit division: 0

## 2022-02-16 ENCOUNTER — Telehealth: Payer: Self-pay | Admitting: Hematology and Oncology

## 2022-02-16 NOTE — Telephone Encounter (Signed)
Per 8/14 phone line pt called to cancel lab appointment

## 2022-02-17 ENCOUNTER — Inpatient Hospital Stay: Payer: Medicare Other

## 2022-02-19 ENCOUNTER — Inpatient Hospital Stay: Payer: Medicare Other

## 2022-03-30 ENCOUNTER — Other Ambulatory Visit: Payer: Self-pay | Admitting: Internal Medicine

## 2022-04-07 ENCOUNTER — Other Ambulatory Visit: Payer: Self-pay | Admitting: *Deleted

## 2022-04-07 ENCOUNTER — Telehealth: Payer: Self-pay

## 2022-04-07 ENCOUNTER — Ambulatory Visit (INDEPENDENT_AMBULATORY_CARE_PROVIDER_SITE_OTHER): Payer: Medicare Other | Admitting: Internal Medicine

## 2022-04-07 ENCOUNTER — Encounter: Payer: Self-pay | Admitting: Internal Medicine

## 2022-04-07 VITALS — BP 122/64 | HR 88 | Temp 97.6°F | Ht 62.0 in | Wt 262.0 lb

## 2022-04-07 DIAGNOSIS — D638 Anemia in other chronic diseases classified elsewhere: Secondary | ICD-10-CM

## 2022-04-07 DIAGNOSIS — I5032 Chronic diastolic (congestive) heart failure: Secondary | ICD-10-CM | POA: Diagnosis not present

## 2022-04-07 DIAGNOSIS — D649 Anemia, unspecified: Secondary | ICD-10-CM

## 2022-04-07 DIAGNOSIS — L280 Lichen simplex chronicus: Secondary | ICD-10-CM

## 2022-04-07 LAB — CBC
HCT: 23.2 % — CL (ref 36.0–46.0)
Hemoglobin: 7.1 g/dL — CL (ref 12.0–15.0)
MCHC: 30.7 g/dL (ref 30.0–36.0)
MCV: 70.1 fl — ABNORMAL LOW (ref 78.0–100.0)
Platelets: 313 10*3/uL (ref 150.0–400.0)
RBC: 3.31 Mil/uL — ABNORMAL LOW (ref 3.87–5.11)
RDW: 20.6 % — ABNORMAL HIGH (ref 11.5–15.5)
WBC: 9.2 10*3/uL (ref 4.0–10.5)

## 2022-04-07 LAB — RENAL FUNCTION PANEL
Albumin: 3.3 g/dL — ABNORMAL LOW (ref 3.5–5.2)
BUN: 23 mg/dL (ref 6–23)
CO2: 27 mEq/L (ref 19–32)
Calcium: 8.6 mg/dL (ref 8.4–10.5)
Chloride: 101 mEq/L (ref 96–112)
Creatinine, Ser: 1.3 mg/dL — ABNORMAL HIGH (ref 0.40–1.20)
GFR: 37.51 mL/min — ABNORMAL LOW (ref >60.00)
Glucose, Bld: 100 mg/dL — ABNORMAL HIGH (ref 70–99)
Phosphorus: 3.3 mg/dL (ref 2.3–4.6)
Potassium: 4.3 mEq/L (ref 3.5–5.1)
Sodium: 137 mEq/L (ref 135–145)

## 2022-04-07 LAB — HEPATIC FUNCTION PANEL
ALT: 10 U/L (ref 0–35)
AST: 28 U/L (ref 0–37)
Albumin: 3.3 g/dL — ABNORMAL LOW (ref 3.5–5.2)
Alkaline Phosphatase: 130 U/L — ABNORMAL HIGH (ref 39–117)
Bilirubin, Direct: 0.4 mg/dL — ABNORMAL HIGH (ref 0.0–0.3)
Total Bilirubin: 1.3 mg/dL — ABNORMAL HIGH (ref 0.2–1.2)
Total Protein: 5.4 g/dL — ABNORMAL LOW (ref 6.0–8.3)

## 2022-04-07 LAB — TSH: TSH: 4.65 u[IU]/mL (ref 0.35–5.50)

## 2022-04-07 MED ORDER — COLCHICINE 0.6 MG PO TABS: 0.6000 mg | ORAL_TABLET | Freq: Two times a day (BID) | ORAL | 11 refills | Status: DC

## 2022-04-07 MED ORDER — TRAMADOL HCL 50 MG PO TABS: 50.0000 mg | ORAL_TABLET | Freq: Three times a day (TID) | ORAL | 0 refills | Status: DC | PRN

## 2022-04-07 NOTE — Telephone Encounter (Signed)
Received a call from Santiago Glad at [facility] with critical lab results Hemoglobin 7.1 and hematocrit 23.2 Spoke to Dr Damita Dunnings and he advised pt go to ER. I have called Dr Silvio Pate and left a message. Spoke to pt's daughter and she said 7.1 is not usually an ER visit for her. She asked that we reach out to Dr Lindi Adie and get her scheduled for transfusion ASAP. I called the office twice and got the nurse VM. I left a message to have her call me back and I called [name] to let her know.  Received call from [facility], Dr Geralyn Flash RN. She will call pt's daughter to get her set-up for transfusions asap.

## 2022-04-07 NOTE — Assessment & Plan Note (Signed)
Will recheck today--daughter's preference (though not clear that she is symptomatic like usual)

## 2022-04-07 NOTE — Assessment & Plan Note (Signed)
Calves are tight at the top but not pitting Rarely uses the torsemide--but it gives extended diuretic effect

## 2022-04-07 NOTE — Assessment & Plan Note (Signed)
Chronic pruritis Is on hydroxyzine 10 bid -tid Gabapentin 300/600 Topical Rx Discussed adding anti anxiety (like sertraline or duloxetine)---she adamantly opposes

## 2022-04-07 NOTE — Progress Notes (Signed)
Subjective:    Patient ID: Chelsea Keller, female    DOB: March 15, 1935, 87 y.o.   MRN: [id number]  HPI Here with daughter due to ongoing skin problems---and other concerns  Has the breaking out ---lesions on legs, arms, chest Improves and then it comes back Will awake with blood from some of them---from scratching Has tried multiple creams from derm (Kowalski)---even eucrisa  Changed to moisturizing soap Tried aveeno also--no clear help Uses the hydroxyzine--- twice a day (and up to tid). Helps only some of the time  "I'm almost 29, my nerves are shot" Son in law nearing death  Current Outpatient Medications on File Prior to Visit  Medication Sig Dispense Refill   acetaminophen (TYLENOL) 500 MG tablet Take 1,000-1,500 mg by mouth every 6 (six) hours as needed for moderate pain or headache (pain).      allopurinol (ZYLOPRIM) 100 MG tablet Take 1 tablet (100 mg total) by mouth daily. 90 tablet 3   allopurinol (ZYLOPRIM) 300 MG tablet Take 1 tablet (300 mg total) by mouth daily. 90 tablet 3   apixaban (ELIQUIS) 2.5 MG TABS tablet Take 1 tablet (2.5 mg total) by mouth 2 (two) times daily. 180 tablet 3   colchicine 0.6 MG tablet Take 1 tablet (0.6 mg total) by mouth 2 (two) times daily. 60 tablet 11   Crisaborole (EUCRISA) 2 % OINT Apply to skin daily 60 g 4   fluconazole (DIFLUCAN) 150 MG tablet Take 150 mg by mouth once.     gabapentin (NEURONTIN) 300 MG capsule Take 1 capsule in the morning and 2 capsules at bedtime (Patient taking differently: Take 300-600 mg by mouth See admin instructions. Take 1 capsule in the morning and 2 capsules at bedtime) 270 capsule 3   hydrOXYzine (ATARAX) 10 MG tablet TAKE 1 TABLET BY MOUTH THREE TIMES A DAY AS NEEDED 270 tablet 1   KLOR-CON M20 20 MEQ tablet TAKE 2 TABLETS (40 MEQ TOTAL) DAILY (Patient taking differently: Take 40 mEq by mouth daily.) 180 tablet 3   ondansetron (ZOFRAN) 4 MG tablet Take 1 tablet (4 mg total) by mouth every 8 (eight) hours as  needed for nausea or vomiting. 20 tablet 0   torsemide (DEMADEX) 20 MG tablet Take 20 mg by mouth daily as needed (for fluid).     traMADol (ULTRAM) 50 MG tablet Take 1 tablet (50 mg total) by mouth 3 (three) times daily as needed for moderate pain. 60 tablet 0   triamterene-hydrochlorothiazide (MAXZIDE-25) 37.5-25 MG tablet TAKE 1 TABLET DAILY 90 tablet 3   No current facility-administered medications on file prior to visit.    Allergies  Allergen Reactions   Codeine Sulfate Shortness Of Breath   Celecoxib Other (See Comments)    Caused vaginal bleeding   Erythromycin Base Nausea And Vomiting   Spironolactone Nausea Only and Other (See Comments)    Blurred vision, Upset stomach, lethargy    Past Medical History:  Diagnosis Date   Allergy    Anemia    Anxiety    Arthritis    Arthritis of sacroiliac joint of both sides 11/12/2017   Bilateral pulmonary embolism (HCC) 06/23/2017   Chronic diastolic CHF (congestive heart failure) ([place])    a. Echo 1/16:  mild LVH, EF normal, grade 1 DD, MAC   Chronic venous insufficiency    chronic LE edema   DDD (degenerative disc disease), lumbar 11/12/2017   Degenerative joint disease (DJD) of hip, Bilateral  11/12/2017   Depression    Fibromyalgia  constant pain   Hx of cardiac catheterization    a. LHC in [place] "ok" per patient with mild plaque in a single vessel - records not available   Hx of cardiovascular stress test    a. Nuclear study in 2008 normal   Hx of colonic polyps    Hypertension    Hypertriglyceridemia    Impaired fasting glucose    PONV (postoperative nausea and vomiting)    PUD (peptic ulcer disease)    hx of gastric ulcer   Pulmonary emboli ([place]) 06/2017   Vitamin B12 deficiency     Past Surgical History:  Procedure Laterality Date   ABDOMINAL HYSTERECTOMY     CATARACT EXTRACTION  10/2003   OD   CHOLECYSTECTOMY     COLONOSCOPY W/ POLYPECTOMY     COLONOSCOPY WITH PROPOFOL N/A 10/15/2014   Procedure: COLONOSCOPY  WITH PROPOFOL;  Surgeon: Ladene Artist, MD;  Location: [facility] ENDOSCOPY;  Service: Endoscopy;  Laterality: N/A;   COLONOSCOPY WITH PROPOFOL N/A 03/23/2018   Procedure: COLONOSCOPY WITH PROPOFOL;  Surgeon: Gatha Mayer, MD;  Location: [facility] ENDOSCOPY;  Service: Endoscopy;  Laterality: N/A;   ESOPHAGOGASTRODUODENOSCOPY (EGD) WITH PROPOFOL N/A 10/15/2014   Procedure: ESOPHAGOGASTRODUODENOSCOPY (EGD) WITH PROPOFOL;  Surgeon: Ladene Artist, MD;  Location: [facility] ENDOSCOPY;  Service: Endoscopy;  Laterality: N/A;   EXTERNAL FIXATION ANKLE FRACTURE     Fx.  left ankle-fixation with pins later removed sec to infection [facility]   left with pins   EYE SURGERY     FEMUR FRACTURE SURGERY  06/2009   FRACTURE SURGERY     HARDWARE REMOVAL Left 10/05/2013   Procedure: HARDWARE REMOVAL LEFT DISTAL FEMUR;  Surgeon: Rozanna Box, MD;  Location: [place];  Service: Orthopedics;  Laterality: Left;   HEMIARTHROPLASTY SHOULDER FRACTURE  06/2009   HOT HEMOSTASIS N/A 03/23/2018   Procedure: HOT HEMOSTASIS (ARGON PLASMA COAGULATION/BICAP);  Surgeon: Gatha Mayer, MD;  Location: [facility] ENDOSCOPY;  Service: Endoscopy;  Laterality: N/A;   JOINT REPLACEMENT     STERIOD INJECTION Right 10/05/2013   Procedure: STEROID INJECTION;  Surgeon: Rozanna Box, MD;  Location: [place];  Service: Orthopedics;  Laterality: Right;   TONSILLECTOMY     TONSILLECTOMY     TOTAL KNEE ARTHROPLASTY  03/09   left    Family History  Problem Relation Age of Onset   Depression Mother    Cancer Mother        uterine cancer   Heart attack Father    Cancer Brother        prostate cancer   Diabetes Maternal Aunt    Arthritis Brother    Asthma Brother    Stroke Maternal Grandmother    Pulmonary embolism Daughter     Social History   Socioeconomic History   Marital status: Widowed    Spouse name: Not on file   Number of children: 4   Years of education: Not on file   Highest education level: Not on file   Occupational History   Occupation: retired crossing guard   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  Tobacco Use   Smoking status: Former    Packs/day: 1.00    Years: 40.00    Total pack years: 40.00    Types: Cigarettes    Quit date: 07/06/1993    Years since quitting: 28.7    Passive exposure: Past   Smokeless tobacco: Never  Vaping Use   Vaping Use:  Never used  Substance and Sexual Activity   Alcohol use: No    Alcohol/week: 0.0 standard drinks of alcohol   Drug use: No   Sexual activity: Not Currently  Other Topics Concern   Not on file  Social History Narrative   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 [employer], lives w/ daughter and adult grandsons   Former smoker, no EtOH      No living will   Plans to do health care POA forms---wants daughter [name]   Would accept resuscitation attempts---no prolonged ventilation   Absolutely no feeding tube   Social Determinants of Health   Financial Resource Strain: Not on file  Food Insecurity: Not on file  Transportation Needs: Not on file  Physical Activity: Not on file  Stress: Not on file  Social Connections: Not on file  Intimate Partner Violence: Not on file   Review of Systems Weight is stable---did have more swelling so had to use the torsemide     Objective:   Physical Exam Constitutional:      Appearance: Normal appearance.  Skin:    Comments: Widespread excoriations --especially arms and chest  Neurological:     Mental Status: She is alert.            Assessment & Plan:

## 2022-04-07 NOTE — Progress Notes (Signed)
Received call from [place] with PCP stating pt Hgb today 7.1.  RN placed call to pt who states she is experiencing fatigue x several days.  Per MD pt needing labs for type and screen and infusion appt for pt to receive 2 units of PRBC's. Orders placed, appt scheduled and pt verbalized understanding of appt date and time

## 2022-04-08 ENCOUNTER — Inpatient Hospital Stay: Payer: Medicare Other | Attending: Hematology and Oncology

## 2022-04-08 ENCOUNTER — Other Ambulatory Visit: Payer: Self-pay

## 2022-04-08 ENCOUNTER — Other Ambulatory Visit: Payer: Self-pay | Admitting: *Deleted

## 2022-04-08 DIAGNOSIS — Z79899 Other long term (current) drug therapy: Secondary | ICD-10-CM | POA: Insufficient documentation

## 2022-04-08 DIAGNOSIS — D638 Anemia in other chronic diseases classified elsewhere: Secondary | ICD-10-CM

## 2022-04-08 DIAGNOSIS — D500 Iron deficiency anemia secondary to blood loss (chronic): Secondary | ICD-10-CM | POA: Insufficient documentation

## 2022-04-08 DIAGNOSIS — K922 Gastrointestinal hemorrhage, unspecified: Secondary | ICD-10-CM | POA: Insufficient documentation

## 2022-04-08 LAB — CBC WITH DIFFERENTIAL (CANCER CENTER ONLY)
Abs Immature Granulocytes: 0.03 10*3/uL (ref 0.00–0.07)
Basophils Absolute: 0.1 10*3/uL (ref 0.0–0.1)
Basophils Relative: 1 %
Eosinophils Absolute: 1.7 10*3/uL — ABNORMAL HIGH (ref 0.0–0.5)
Eosinophils Relative: 16 %
HCT: 23.2 % — ABNORMAL LOW (ref 36.0–46.0)
Hemoglobin: 6.7 g/dL — CL (ref 12.0–15.0)
Immature Granulocytes: 0 %
Lymphocytes Relative: 8 %
Lymphs Abs: 0.8 10*3/uL (ref 0.7–4.0)
MCH: 21.3 pg — ABNORMAL LOW (ref 26.0–34.0)
MCHC: 28.9 g/dL — ABNORMAL LOW (ref 30.0–36.0)
MCV: 73.9 fL — ABNORMAL LOW (ref 80.0–100.0)
Monocytes Absolute: 0.8 10*3/uL (ref 0.1–1.0)
Monocytes Relative: 8 %
Neutro Abs: 6.8 10*3/uL (ref 1.7–7.7)
Neutrophils Relative %: 67 %
Platelet Count: 307 10*3/uL (ref 150–400)
RBC: 3.14 MIL/uL — ABNORMAL LOW (ref 3.87–5.11)
RDW: 19.3 % — ABNORMAL HIGH (ref 11.5–15.5)
WBC Count: 10.2 10*3/uL (ref 4.0–10.5)
nRBC: 0 % (ref 0.0–0.2)

## 2022-04-08 LAB — FERRITIN: Ferritin: 8 ng/mL — ABNORMAL LOW (ref 11–307)

## 2022-04-08 LAB — IRON AND IRON BINDING CAPACITY (CC-WL,HP ONLY)
Iron: 19 ug/dL — ABNORMAL LOW (ref 28–170)
Saturation Ratios: 5 % — ABNORMAL LOW (ref 10.4–31.8)
TIBC: 405 ug/dL (ref 250–450)
UIBC: 386 ug/dL

## 2022-04-08 LAB — SAMPLE TO BLOOD BANK

## 2022-04-08 LAB — PREPARE RBC (CROSSMATCH)

## 2022-04-08 NOTE — Telephone Encounter (Signed)
Yes---that is not unusual for her and certainly not an ER level. Glad we could set things up with Dr Geralyn Flash office

## 2022-04-08 NOTE — Progress Notes (Signed)
Faxed blood slip to [facility] and confirmed receipt and that orders are visible. Dash called for stat pick up 10/5 at 0745.

## 2022-04-08 NOTE — Progress Notes (Signed)
RN successfully faxed blood product pickup slip to [facility] at [phone] and the blood bank at [phone].  RN contacted blood bank and confirmed prepare was released for pt to receive 2 units tomorrow 04/09/22.

## 2022-04-09 ENCOUNTER — Inpatient Hospital Stay: Payer: Medicare Other

## 2022-04-09 DIAGNOSIS — D500 Iron deficiency anemia secondary to blood loss (chronic): Secondary | ICD-10-CM | POA: Diagnosis not present

## 2022-04-09 DIAGNOSIS — D638 Anemia in other chronic diseases classified elsewhere: Secondary | ICD-10-CM

## 2022-04-09 DIAGNOSIS — K922 Gastrointestinal hemorrhage, unspecified: Secondary | ICD-10-CM | POA: Diagnosis not present

## 2022-04-09 DIAGNOSIS — Z79899 Other long term (current) drug therapy: Secondary | ICD-10-CM | POA: Diagnosis not present

## 2022-04-09 MED ORDER — ACETAMINOPHEN 325 MG PO TABS: 650.0000 mg | ORAL_TABLET | Freq: Once | ORAL | Status: DC

## 2022-04-09 MED ORDER — DIPHENHYDRAMINE HCL 25 MG PO CAPS
25.0000 mg | ORAL_CAPSULE | Freq: Once | ORAL | Status: AC
  Administered 2022-04-09: 25 mg via ORAL
  Filled 2022-04-09: qty 1

## 2022-04-09 MED ORDER — SODIUM CHLORIDE 0.9% IV SOLUTION
250.0000 mL | Freq: Once | INTRAVENOUS | Status: AC
  Administered 2022-04-09: 250 mL via INTRAVENOUS

## 2022-04-09 NOTE — Patient Instructions (Signed)

## 2022-04-10 LAB — TYPE AND SCREEN
ABO/RH(D): B POS
Antibody Screen: NEGATIVE
Unit division: 0
Unit division: 0

## 2022-04-10 LAB — BPAM RBC
Blood Product Expiration Date: 202310072359
Blood Product Expiration Date: 202310162359
ISSUE DATE / TIME: 202310050750
ISSUE DATE / TIME: 202310050750
Unit Type and Rh: 1700
Unit Type and Rh: 7300

## 2022-04-15 DIAGNOSIS — M1711 Unilateral primary osteoarthritis, right knee: Secondary | ICD-10-CM | POA: Diagnosis not present

## 2022-04-15 DIAGNOSIS — M25561 Pain in right knee: Secondary | ICD-10-CM | POA: Diagnosis not present

## 2022-04-25 ENCOUNTER — Encounter (HOSPITAL_COMMUNITY): Payer: Self-pay

## 2022-04-25 ENCOUNTER — Emergency Department (HOSPITAL_COMMUNITY)
Admission: EM | Admit: 2022-04-25 | Discharge: 2022-04-26 | Disposition: A | Discharge status: Home / Self Care | Payer: Medicare Other | Attending: Emergency Medicine | Admitting: Emergency Medicine

## 2022-04-25 ENCOUNTER — Other Ambulatory Visit: Payer: Self-pay

## 2022-04-25 ENCOUNTER — Encounter: Payer: Self-pay | Admitting: Internal Medicine

## 2022-04-25 ENCOUNTER — Emergency Department (HOSPITAL_COMMUNITY): Payer: Medicare Other

## 2022-04-25 DIAGNOSIS — D509 Iron deficiency anemia, unspecified: Secondary | ICD-10-CM | POA: Insufficient documentation

## 2022-04-25 DIAGNOSIS — R748 Abnormal levels of other serum enzymes: Secondary | ICD-10-CM | POA: Diagnosis not present

## 2022-04-25 DIAGNOSIS — Z79899 Other long term (current) drug therapy: Secondary | ICD-10-CM | POA: Diagnosis not present

## 2022-04-25 DIAGNOSIS — I10 Essential (primary) hypertension: Secondary | ICD-10-CM | POA: Diagnosis not present

## 2022-04-25 DIAGNOSIS — U071 COVID-19: Secondary | ICD-10-CM | POA: Diagnosis not present

## 2022-04-25 DIAGNOSIS — I5030 Unspecified diastolic (congestive) heart failure: Secondary | ICD-10-CM | POA: Diagnosis not present

## 2022-04-25 DIAGNOSIS — R7401 Elevation of levels of liver transaminase levels: Secondary | ICD-10-CM | POA: Insufficient documentation

## 2022-04-25 DIAGNOSIS — R509 Fever, unspecified: Secondary | ICD-10-CM | POA: Diagnosis not present

## 2022-04-25 DIAGNOSIS — Z7901 Long term (current) use of anticoagulants: Secondary | ICD-10-CM | POA: Insufficient documentation

## 2022-04-25 DIAGNOSIS — R17 Unspecified jaundice: Secondary | ICD-10-CM

## 2022-04-25 DIAGNOSIS — N289 Disorder of kidney and ureter, unspecified: Secondary | ICD-10-CM | POA: Diagnosis not present

## 2022-04-25 DIAGNOSIS — R5383 Other fatigue: Secondary | ICD-10-CM | POA: Diagnosis not present

## 2022-04-25 DIAGNOSIS — I110 Hypertensive heart disease with heart failure: Secondary | ICD-10-CM | POA: Insufficient documentation

## 2022-04-25 DIAGNOSIS — R197 Diarrhea, unspecified: Secondary | ICD-10-CM | POA: Diagnosis not present

## 2022-04-25 DIAGNOSIS — L409 Psoriasis, unspecified: Secondary | ICD-10-CM

## 2022-04-25 DIAGNOSIS — R0602 Shortness of breath: Secondary | ICD-10-CM | POA: Diagnosis present

## 2022-04-25 DIAGNOSIS — R0600 Dyspnea, unspecified: Secondary | ICD-10-CM | POA: Diagnosis not present

## 2022-04-25 LAB — CBC WITH DIFFERENTIAL/PLATELET
Abs Immature Granulocytes: 0.07 10*3/uL (ref 0.00–0.07)
Basophils Absolute: 0.1 10*3/uL (ref 0.0–0.1)
Basophils Relative: 1 %
Eosinophils Absolute: 0.5 10*3/uL (ref 0.0–0.5)
Eosinophils Relative: 6 %
HCT: 30.1 % — ABNORMAL LOW (ref 36.0–46.0)
Hemoglobin: 8.7 g/dL — ABNORMAL LOW (ref 12.0–15.0)
Immature Granulocytes: 1 %
Lymphocytes Relative: 6 %
Lymphs Abs: 0.6 10*3/uL — ABNORMAL LOW (ref 0.7–4.0)
MCH: 22.6 pg — ABNORMAL LOW (ref 26.0–34.0)
MCHC: 28.9 g/dL — ABNORMAL LOW (ref 30.0–36.0)
MCV: 78.2 fL — ABNORMAL LOW (ref 80.0–100.0)
Monocytes Absolute: 1.1 10*3/uL — ABNORMAL HIGH (ref 0.1–1.0)
Monocytes Relative: 12 %
Neutro Abs: 6.8 10*3/uL (ref 1.7–7.7)
Neutrophils Relative %: 74 %
Platelets: 336 10*3/uL (ref 150–400)
RBC: 3.85 MIL/uL — ABNORMAL LOW (ref 3.87–5.11)
RDW: 20.1 % — ABNORMAL HIGH (ref 11.5–15.5)
WBC: 9.2 10*3/uL (ref 4.0–10.5)
nRBC: 0 % (ref 0.0–0.2)

## 2022-04-25 MED ORDER — FEXOFENADINE HCL 60 MG PO TABS: 60.0000 mg | ORAL_TABLET | Freq: Two times a day (BID) | ORAL | 0 refills | Status: DC

## 2022-04-25 NOTE — ED Provider Notes (Signed)
[facility] DEPT Provider Note   CSN: [id number] Arrival date & time: 04/25/22  2217     History {Add pertinent medical, surgical, social history, OB history to HPI:1} Chief Complaint  Patient presents with   Cough   Shortness of Breath    Chelsea Keller is a 87 y.o. female.  The history is provided by the patient and a relative.  Cough Associated symptoms: shortness of breath   Shortness of Breath Associated symptoms: cough   She has history of hypertension, diastolic heart failure, pulmonary embolism anticoagulated on apixaban and tested positive for COVID-19 today.  Her daughter had tested positive about 1 week ago.  Patient endorses nonproductive cough, mild headache, mild diarrhea.  She has been short of breath.  Symptoms have been present for about a week, and have not been worsening.  She is mainly concerned because her COVID test was -2 days ago and is positive today.  She has had fever at home as high as 102.3.  She has had chills and sweats.  She denies nausea or vomiting.  Appetite has been decreased.   Home Medications Prior to Admission medications   Medication Sig Start Date End Date Taking? Authorizing Provider  acetaminophen (TYLENOL) 500 MG tablet Take 1,000-1,500 mg by mouth every 6 (six) hours as needed for moderate pain or headache (pain).     [provider]  allopurinol (ZYLOPRIM) 100 MG tablet Take 1 tablet (100 mg total) by mouth daily. 11/12/21   Venia Carbon, MD  allopurinol (ZYLOPRIM) 300 MG tablet Take 1 tablet (300 mg total) by mouth daily. 11/12/21   Venia Carbon, MD  apixaban (ELIQUIS) 2.5 MG TABS tablet Take 1 tablet (2.5 mg total) by mouth 2 (two) times daily. 12/31/21   Venia Carbon, MD  colchicine 0.6 MG tablet Take 1 tablet (0.6 mg total) by mouth 2 (two) times daily. 04/07/22   Venia Carbon, MD  Crisaborole Georga Hacking) 2 % OINT Apply to skin daily 11/18/20   Ralene Bathe, MD  fluconazole  (DIFLUCAN) 150 MG tablet Take 150 mg by mouth once. 02/05/21   [provider]  gabapentin (NEURONTIN) 300 MG capsule Take 1 capsule in the morning and 2 capsules at bedtime Patient taking differently: Take 300-600 mg by mouth See admin instructions. Take 1 capsule in the morning and 2 capsules at bedtime 07/22/21   Venia Carbon, MD  hydrOXYzine (ATARAX) 10 MG tablet TAKE 1 TABLET BY MOUTH THREE TIMES A DAY AS NEEDED 01/05/22   Venia Carbon, MD  KLOR-CON M20 20 MEQ tablet TAKE 2 TABLETS (40 MEQ TOTAL) DAILY Patient taking differently: Take 40 mEq by mouth daily. 05/05/21   Venia Carbon, MD  ondansetron (ZOFRAN) 4 MG tablet Take 1 tablet (4 mg total) by mouth every 8 (eight) hours as needed for nausea or vomiting. 07/19/20   Venia Carbon, MD  predniSONE (DELTASONE) 10 MG tablet Take by mouth. 04/16/22   [provider]  torsemide (DEMADEX) 20 MG tablet Take 20 mg by mouth daily as needed (for fluid).    [provider]  traMADol (ULTRAM) 50 MG tablet Take 1 tablet (50 mg total) by mouth 3 (three) times daily as needed for moderate pain. 04/07/22   Venia Carbon, MD  triamterene-hydrochlorothiazide (IONGEXB-28) 37.5-25 MG tablet TAKE 1 TABLET DAILY 03/30/22   Viviana Simpler I, MD      Allergies    Codeine sulfate, Celecoxib, Erythromycin base, and Spironolactone  Review of Systems   Review of Systems  Respiratory:  Positive for cough and shortness of breath.   All other systems reviewed and are negative.   Physical Exam Updated Vital Signs BP (!) 163/55 (BP Location: Left Arm)   Pulse 93   Temp 99.4 F (37.4 C) (Oral)   Resp (!) 22   Ht '5\' 2"'$  (1.575 m)   Wt 119.7 kg   SpO2 98%   BMI 48.29 kg/m  Physical Exam Vitals and nursing note reviewed.   87 year old female, resting comfortably and in no acute distress. Vital signs are significant for elevated blood pressure and borderline elevated respiratory rate. Oxygen saturation is 98%, which  is normal. Head is normocephalic and atraumatic. PERRLA, EOMI. Oropharynx is clear. Neck is nontender and supple without adenopathy or JVD. Back is nontender and there is no CVA tenderness. Lungs are clear without rales, wheezes, or rhonchi. Chest is nontender. Heart has regular rate and rhythm without murmur. Abdomen is soft, flat, nontender. Extremities have trace edema, full range of motion is present. Skin: Rash of psoriasis is present rather diffusely. Neurologic: Mental status is normal, cranial nerves are intact, moves all extremities equally.  ED Results / Procedures / Treatments   Labs (all labs ordered are listed, but only abnormal results are displayed) Labs Reviewed  CBC WITH DIFFERENTIAL/PLATELET - Abnormal; Notable for the following components:      Result Value   RBC 3.85 (*)    Hemoglobin 8.7 (*)    HCT 30.1 (*)    MCV 78.2 (*)    MCH 22.6 (*)    MCHC 28.9 (*)    RDW 20.1 (*)    Lymphs Abs 0.6 (*)    Monocytes Absolute 1.1 (*)    All other components within normal limits  SARS CORONAVIRUS 2 BY RT PCR  COMPREHENSIVE METABOLIC PANEL   Radiology DG Chest Portable 1 View  Result Date: 04/25/2022 CLINICAL DATA:  Dyspnea EXAM: PORTABLE CHEST 1 VIEW COMPARISON:  10/16/2020 FINDINGS: Lungs are well expanded, symmetric, and clear. No pneumothorax or pleural effusion. Cardiac size within normal limits when accounting for semi-erect positioning. Pulmonary vascularity is normal. Osseous structures are age-appropriate. No acute bone abnormality. IMPRESSION: No active disease. Electronically Signed   By: Fidela Salisbury M.D.   On: 04/25/2022 22:55    Procedures Procedures  {Document cardiac monitor, telemetry assessment procedure when appropriate:1}  Medications Ordered in ED Medications - No data to display  ED Course/ Medical Decision Making/ A&P                           Medical Decision Making  COVID-19 infection with subjective dyspnea but adequate oxygen  saturation on room air.  Patient is not ambulatory at home, so there is no need to assess her oxygen status with ambulation.  Chest x-ray shows no evidence of pneumonia.  I have independently viewed the image, and agree with the radiologist's interpretation.  I have reviewed and interpreted her laboratory test, and my interpretation is microcytic anemia which is improved compared with the most recent value on 04/08/2022.  Patient does get periodic transfusions because of anemia.  At this point, comprehensive metabolic panel is pending.  Since she is not hypoxic, if there are no significant abnormalities on metabolic panel, anticipate discharge.  I plan to offer antiviral treatment.  {Document critical care time when appropriate:1} {Document review of labs and clinical decision tools ie heart score, Chads2Vasc2 etc:1}  {  Document your independent review of radiology images, and any outside records:1} {Document your discussion with family members, caretakers, and with consultants:1} {Document social determinants of health affecting pt's care:1} {Document your decision making why or why not admission, treatments were needed:1} Final Clinical Impression(s) / ED Diagnoses Final diagnoses:  None    Rx / DC Orders ED Discharge Orders     None

## 2022-04-25 NOTE — ED Notes (Signed)
Call received from pt daughter Kalasia Crafton [phone] requesting rtn call for pt status/updates. Request to be updated re: discharge or admission d/t personal medical condition and need to p/u pt if being discharged. [employer]

## 2022-04-25 NOTE — ED Triage Notes (Signed)
Patient tested positive for COVID tonight, she is having SOB, and fever, chills, fatigue

## 2022-04-26 LAB — COMPREHENSIVE METABOLIC PANEL
ALT: 19 U/L (ref 0–44)
AST: 64 U/L — ABNORMAL HIGH (ref 15–41)
Albumin: 3.1 g/dL — ABNORMAL LOW (ref 3.5–5.0)
Alkaline Phosphatase: 143 U/L — ABNORMAL HIGH (ref 38–126)
Anion gap: 8 (ref 5–15)
BUN: 19 mg/dL (ref 8–23)
CO2: 23 mmol/L (ref 22–32)
Calcium: 8.4 mg/dL — ABNORMAL LOW (ref 8.9–10.3)
Chloride: 103 mmol/L (ref 98–111)
Creatinine, Ser: 1.19 mg/dL — ABNORMAL HIGH (ref 0.44–1.00)
GFR, Estimated: 45 mL/min — ABNORMAL LOW (ref >60)
Glucose, Bld: 124 mg/dL — ABNORMAL HIGH (ref 70–99)
Potassium: 4.4 mmol/L (ref 3.5–5.1)
Sodium: 134 mmol/L — ABNORMAL LOW (ref 135–145)
Total Bilirubin: 1.5 mg/dL — ABNORMAL HIGH (ref 0.3–1.2)
Total Protein: 5.7 g/dL — ABNORMAL LOW (ref 6.5–8.1)

## 2022-04-26 LAB — SARS CORONAVIRUS 2 BY RT PCR: SARS Coronavirus 2 by RT PCR: POSITIVE — AB

## 2022-04-26 NOTE — Discharge Instructions (Addendum)
Drink plenty of fluids.  Take acetaminophen and/or ibuprofen as needed for fever or aching.  Return if your oxygen levels drop to 90 or below.  Return if you develop any confusion.  Take the fexofenadine twice a day.  It is to try to help with the itching from your psoriasis.  You may still take hydroxyzine or diphenhydramine as needed for breakthrough itching.  You will need to work with your dermatologist for more definitive care of your psoriasis.  Continue to work with your primary care provider and your hematologist regarding your anemia.

## 2022-04-27 ENCOUNTER — Telehealth: Payer: Self-pay

## 2022-04-27 NOTE — Telephone Encounter (Signed)
I sent a MyChart message that was attached to one her daughter sent in regards to a medication.

## 2022-04-27 NOTE — Telephone Encounter (Signed)
Per chart review tab pt was seen [facility] on 04/25/22. Sending note to Dr Silvio Pate and Larene Beach CMA.   [place] Night - Client TELEPHONE ADVICE RECORD [facility] Patient Name: KALLEY NICHOLL DDELL Gender: Female DOB: 1934/09/26 Age: 87 Y 6 M 28 D Return Phone Number: [phone] (Primary) Address: [place]  [address] Client [facility] Night - Client Client Site [place] Provider Viviana Simpler- MD Contact Type Call Who Is Calling Patient / Member / Family / Caregiver Call Type Triage / Clinical Caller Name CYNDI Boulos Relationship To Patient Daughter Return Phone Number [phone] (Primary) Chief Complaint BREATHING - shortness of breath or sounds breathless Reason for Call Symptomatic / Request for [place] states she tested positive for COVID. Her mother is not feeling well and she is positive now and has a fever of 102.4, trouble breathing, oxygen level is 95, and the pulse rate is 118. She may be dehyrated. Translation No Nurse Assessment Nurse: Lissa Hoard, RN, [place] Date/Time Eilene Ghazi Time): 04/25/2022 9:10:40 PM Confirm and document reason for call. If symptomatic, describe symptoms. ---Caller states that she is feeling short of breath, not feeling well and she is positive now and has a fever of 102.4, trouble breathing, oxygen level is 95, and the pulse rate is 118; denies other symptoms Does the patient have any new or worsening symptoms? ---Yes Will a triage be completed? ---Yes Related visit to physician within the last 2 weeks? ---Yes Does the PT have any chronic conditions? (i.e. diabetes, asthma, this includes High risk factors for pregnancy, etc.) ---Yes List chronic conditions. ---CHF, PE, Is this a behavioral health or substance abuse call? ---No Guidelines Guideline Title Affirmed Question Affirmed Notes  Nurse Date/Time (Eastern Time) COVID-19 - Diagnosed or Suspected SEVERE difficulty breathing (e.g., struggling for each breath, speaks in single words) Lissa Hoard, RN, [place] 04/25/2022 9:12:56 PM PLEASE NOTE: All timestamps contained within this report are represented as [place] Standard Time. CONFIDENTIALTY NOTICE: This fax transmission is intended only for the addressee. It contains information that is legally privileged, confidential or otherwise protected from use or disclosure. If you are not the intended recipient, you are strictly prohibited from reviewing, disclosing, copying using or disseminating any of this information or taking any action in reliance on or regarding this information. If you have received this fax in error, please notify us immediately by telephone so that we can arrange for its return to [place]. Phone: [phone], Toll-Free: [phone], Fax: [phone] Page: 2 of 2 Call Id: [id number] [place]. Time Eilene Ghazi Time) Disposition Final User 04/25/2022 9:08:07 PM Send to Urgent Natale Milch, Stefanie 04/25/2022 9:14:45 PM Call EMS 911 Now Yes Talbert Nan 04/25/2022 9:23:49 PM 911 Outcome Documentation Lissa Hoard, RN, [place] Reason: family calling EMS now Final Disposition 04/25/2022 9:14:45 PM Call EMS 911 Now Yes Lissa Hoard, RN, [place] Caller Disagree/Comply Comply Caller Understands Yes PreDisposition InappropriateToAsk Care Advice Given Per Guideline CALL EMS 911 NOW: * Immediate medical attention is needed. You need to hang up and call 911 (or an ambulance). * Triager Discretion: I'll call you back in a few minutes to be sure you were able to reach them. TELL THE AMBULANCE DISPATCHER ABOUT COVID-19 DIAGNOSIS: * When you call 911, tell the dispatcher that you probably have COVID-19. CARE ADVICE given per COVID-19 - DIAGNOSED OR SUSPECTED (Adult) guideline

## 2022-04-28 ENCOUNTER — Other Ambulatory Visit: Payer: Self-pay | Admitting: Internal Medicine

## 2022-04-30 ENCOUNTER — Telehealth: Payer: Self-pay

## 2022-04-30 NOTE — Progress Notes (Addendum)
Chronic Care Management Pharmacy Assistant   Name: Chelsea Keller  MRN: [id number] DOB: 1935-01-01  Reason for Encounter: Non-CCM [facility] Follow-Up)   Medications: Outpatient Encounter Medications as of 04/30/2022  Medication Sig   acetaminophen (TYLENOL) 500 MG tablet Take 1,000-1,500 mg by mouth every 6 (six) hours as needed for moderate pain or headache (pain).    allopurinol (ZYLOPRIM) 100 MG tablet Take 1 tablet (100 mg total) by mouth daily.   allopurinol (ZYLOPRIM) 300 MG tablet Take 1 tablet (300 mg total) by mouth daily.   apixaban (ELIQUIS) 2.5 MG TABS tablet Take 1 tablet (2.5 mg total) by mouth 2 (two) times daily.   colchicine 0.6 MG tablet Take 1 tablet (0.6 mg total) by mouth 2 (two) times daily.   Crisaborole (EUCRISA) 2 % OINT Apply to skin daily   fexofenadine (ALLEGRA) 60 MG tablet Take 1 tablet (60 mg total) by mouth 2 (two) times daily.   fluconazole (DIFLUCAN) 150 MG tablet Take 150 mg by mouth once.   gabapentin (NEURONTIN) 300 MG capsule Take 1 capsule in the morning and 2 capsules at bedtime (Patient taking differently: Take 300-600 mg by mouth See admin instructions. Take 1 capsule in the morning and 2 capsules at bedtime)   hydrOXYzine (ATARAX) 10 MG tablet TAKE 1 TABLET BY MOUTH THREE TIMES A DAY AS NEEDED   KLOR-CON M20 20 MEQ tablet TAKE 2 TABLETS (40 MEQ TOTAL) DAILY   ondansetron (ZOFRAN) 4 MG tablet Take 1 tablet (4 mg total) by mouth every 8 (eight) hours as needed for nausea or vomiting.   predniSONE (DELTASONE) 10 MG tablet Take by mouth.   torsemide (DEMADEX) 20 MG tablet Take 20 mg by mouth daily as needed (for fluid).   traMADol (ULTRAM) 50 MG tablet Take 1 tablet (50 mg total) by mouth 3 (three) times daily as needed for moderate pain.   triamterene-hydrochlorothiazide (MAXZIDE-25) 37.5-25 MG tablet TAKE 1 TABLET DAILY   No facility-administered encounter medications on file as of 04/30/2022.   Reviewed hospital notes for details of recent  visit. Has patient been contacted by Transitions of Care team? No Has patient seen PCP/specialist for hospital follow up (summarize OV if yes): No  Admitted to the ED on 04/25/2022. Discharge date was 04/25/2022.  Discharged from [facility].   Discharge diagnosis (Principal Problem): Covid-19 Patient was discharged to Home  Brief summary of hospital course: COVID-19 infection with subjective dyspnea but adequate oxygen saturation on room air.  Patient is not ambulatory at home, so there is no need to assess her oxygen status with ambulation.  Chest x-ray shows no evidence of pneumonia.  I have independently viewed the image, and agree with the radiologist's interpretation.  I have reviewed and interpreted her laboratory test, and my interpretation is microcytic anemia which is improved compared with the most recent value on 04/08/2022.  Patient does get periodic transfusions because of anemia.  At this point, comprehensive metabolic panel is pending.  Since she is not hypoxic, if there are no significant abnormalities on metabolic panel, anticipate discharge.  I plan to offer antiviral treatment.   I have reviewed and interpreted her comprehensive metabolic panel, and my interpretation is borderline hyponatremia which is not felt to be clinically significant, stable renal insufficiency without evidence of dehydration, mild elevation of alkaline phosphatase which is slightly worse than on 04/07/2022, mild elevation of AST which is new but of questionable clinical significance, mildly elevated total bilirubin which is slightly worse than on 04/07/2022.  I have offered patient IV fluids, but she declines.  I have offered her a prescription for Paxlovid, but after discussion of risks and benefits, patient has declined.  I considered hospitalization, but since she is maintaining a good oxygen saturation on room air, I do not see any benefit from hospitalization and I am discharging her home.  She is  requesting a prescription for fexofenadine for itching from psoriasis and I have given her a prescription for a 1 month supply.  I have encouraged her to keep her hydration up at home.  Given strict return precautions for any worsening of condition, especially hypoxia at home or altered mentation at home.  I have also had an extensive discussion with the patient's daughter regarding her ED evaluation and lab results and chest x-ray results.  She expresses understanding of the condition and reason for discharge.  She also expresses understanding of need for her return if there is clinical worsening at home.   New?Medications Started at Ambulatory Care Center Discharge:?? -Started fexofenadine (ALLEGRA) 60 MG tablet   Medications that remain the same after Hospital Discharge:??  -All other medications will remain the same.    Next CCM appt: No upcoming appointments  Other upcoming appts: PCP appointment on 05/14/2022  Charlene Brooke, PharmD notified and will determine if action is needed.  Charlene Brooke, CPP notified  [name], [place] Clinical Pharmacy Assistant [phone]   Pharmacist addendum: Primary care team has been in contact with patient. [facility] TOC team is also attempting to contact. No further action needed at this time.  Charlene Brooke, PharmD, BCACP 05/05/22 10:11 AM

## 2022-05-01 ENCOUNTER — Telehealth: Payer: Self-pay

## 2022-05-01 ENCOUNTER — Encounter: Payer: Self-pay | Admitting: Internal Medicine

## 2022-05-01 ENCOUNTER — Telehealth: Payer: Self-pay | Admitting: Internal Medicine

## 2022-05-01 NOTE — Telephone Encounter (Signed)
        Patient  visited [facility] on 10/22    Telephone encounter attempt : 1st    A HIPAA compliant voice message was left requesting a return call.  Instructed patient to call back    [place] [employer]  [phone] [address], [place], [place] [address] Phone: [phone] Email: [email or web address]

## 2022-05-01 NOTE — Telephone Encounter (Signed)
Placed in PCPs inbox to review

## 2022-05-01 NOTE — Telephone Encounter (Signed)
Patient daughter came in office to drop off Placard for PCP to fill out and has been placed in PCP folder. Call back number [phone].

## 2022-05-05 NOTE — Telephone Encounter (Signed)
Spoke to per [facility]

## 2022-05-05 NOTE — Telephone Encounter (Signed)
Form was picked up today at 1:30.

## 2022-05-11 ENCOUNTER — Encounter: Payer: Self-pay | Admitting: Internal Medicine

## 2022-05-13 ENCOUNTER — Encounter: Payer: Self-pay | Admitting: Internal Medicine

## 2022-05-14 ENCOUNTER — Ambulatory Visit: Payer: Medicare Other | Admitting: Internal Medicine

## 2022-05-15 ENCOUNTER — Ambulatory Visit: Payer: Medicare Other | Admitting: Internal Medicine

## 2022-05-17 NOTE — Progress Notes (Signed)
Patient Care Team: Venia Carbon, MD as PCP - General Angelena Form Annita Brod, MD as PCP - Cardiology (Cardiology) Sharmon Revere as Physician Assistant (Physician Assistant) Nicholas Lose, MD as Consulting Physician (Hematology and Oncology)  DIAGNOSIS:  Encounter Diagnosis  Name Primary?   Iron deficiency anemia due to chronic blood loss Yes     CHIEF COMPLIANT: Follow-up of iron deficiency anemia requiring blood transfusions    INTERVAL HISTORY: Chelsea Keller is a  87 year old with above-mentioned survive deficiency anemia who cannot tolerate any form of intravenous iron therapy and requires periodic blood transfusions for severe anemia.  She is here for routine follow-up. Her main concern is the dry itchy skin mainly on her legs. She states that it itch really bad and it's starting to break her skin. She says this has been going on for about a year. She has seen a Dermatologist for it.  She is intolerant of IV iron and therefore we are treating her with blood transfusions as needed.  She is complaining of bilateral leg swelling as well as itching of the skin.   ALLERGIES:  is allergic to codeine sulfate, celecoxib, erythromycin base, and spironolactone.  MEDICATIONS:  Current Outpatient Medications  Medication Sig Dispense Refill   acetaminophen (TYLENOL) 500 MG tablet Take 1,000-1,500 mg by mouth every 6 (six) hours as needed for moderate pain or headache (pain).      allopurinol (ZYLOPRIM) 100 MG tablet Take 1 tablet (100 mg total) by mouth daily. 90 tablet 3   allopurinol (ZYLOPRIM) 300 MG tablet Take 1 tablet (300 mg total) by mouth daily. 90 tablet 3   apixaban (ELIQUIS) 2.5 MG TABS tablet Take 1 tablet (2.5 mg total) by mouth 2 (two) times daily. 180 tablet 3   colchicine 0.6 MG tablet Take 1 tablet (0.6 mg total) by mouth 2 (two) times daily. 60 tablet 11   Crisaborole (EUCRISA) 2 % OINT Apply to skin daily 60 g 4   fexofenadine (ALLEGRA) 60 MG tablet Take 1  tablet (60 mg total) by mouth 2 (two) times daily. 60 tablet 0   fluconazole (DIFLUCAN) 150 MG tablet Take 150 mg by mouth once.     gabapentin (NEURONTIN) 300 MG capsule Take 1 capsule in the morning and 2 capsules at bedtime (Patient taking differently: Take 300-600 mg by mouth See admin instructions. Take 1 capsule in the morning and 2 capsules at bedtime) 270 capsule 3   hydrOXYzine (ATARAX) 10 MG tablet TAKE 1 TABLET BY MOUTH THREE TIMES A DAY AS NEEDED 270 tablet 1   KLOR-CON M20 20 MEQ tablet TAKE 2 TABLETS (40 MEQ TOTAL) DAILY 180 tablet 3   ondansetron (ZOFRAN) 4 MG tablet Take 1 tablet (4 mg total) by mouth every 8 (eight) hours as needed for nausea or vomiting. 20 tablet 0   predniSONE (DELTASONE) 10 MG tablet Take by mouth.     torsemide (DEMADEX) 20 MG tablet Take 20 mg by mouth daily as needed (for fluid).     traMADol (ULTRAM) 50 MG tablet Take 1 tablet (50 mg total) by mouth 3 (three) times daily as needed for moderate pain. 60 tablet 0   triamterene-hydrochlorothiazide (MAXZIDE-25) 37.5-25 MG tablet TAKE 1 TABLET DAILY 90 tablet 3   No current facility-administered medications for this visit.    PHYSICAL EXAMINATION: ECOG PERFORMANCE STATUS: 3 - Symptomatic, >50% confined to bed  Vitals:   05/22/22 1110  BP: 128/64  Pulse: 93  Resp: 18  Temp: (!) 97.3 F (  36.3 C)  SpO2: 100%   Filed Weights   05/22/22 1110  Weight: 251 lb 9.6 oz (114.1 kg)      LABORATORY DATA:  I have reviewed the data as listed    Latest Ref Rng & Units 05/20/2022    2:08 PM 04/25/2022   11:17 PM 04/07/2022   10:43 AM  CMP  Glucose 70 - 99 mg/dL 127  124  100   BUN 8 - 23 mg/dL '16  19  23   '$ Creatinine 0.44 - 1.00 mg/dL 1.10  1.19  1.30   Sodium 135 - 145 mmol/L 138  134  137   Potassium 3.5 - 5.1 mmol/L 3.7  4.4  4.3   Chloride 98 - 111 mmol/L 106  103  101   CO2 22 - 32 mmol/L '26  23  27   '$ Calcium 8.9 - 10.3 mg/dL 8.6  8.4  8.6   Total Protein 6.5 - 8.1 g/dL 5.7  5.7  5.4   Total  Bilirubin 0.3 - 1.2 mg/dL 0.9  1.5  1.3   Alkaline Phos 38 - 126 U/L 136  143  130   AST 15 - 41 U/L 42  64  28   ALT 0 - 44 U/L '18  19  10     '$ Lab Results  Component Value Date   WBC 7.0 05/20/2022   HGB 7.9 (L) 05/20/2022   HCT 26.5 (L) 05/20/2022   MCV 80.1 05/20/2022   PLT 245 05/20/2022   NEUTROABS 3.9 05/20/2022    ASSESSMENT & PLAN:  Iron deficiency anemia due to chronic blood loss Hospitalization 08/11/2018: Severe GI bleed hemoglobin 4.9 (internal hemorrhoids and angiodysplasia of the colon)Received blood transfusions IV iron treatment: September 2019, March 2020, [facility] June 2020, Venofer January 2021    Toxicities of iron infusion:Patient got profound diarrhea which last for 2 to 3 days after Feraheme (and prescription for Lomotil for 3 days).  With Venofer she did okay but she still had profound diarrhea as well as lethargic   10/10/2019: Hemoglobin 6.8: MCV 82, RDW 18: 2 units of PRBC given on 10/11/2019. 11/20/2019: Hemoglobin 7.1, MCV 81.2 Iron studies ferritin 7, iron saturation 6%, absolute reticulocyte count 88.3 12/26/2020: Hemoglobin 7.9 (2 units PRBC) 08/14/2021: Hemoglobin 10.5, MCV 78.4, iron saturation 27%, ferritin 14 09/16/2021: Hemoglobin 9.8, MCV 81.3 11/17/2021: Hemoglobin 9.5, MCV 80.6, RDW 18.1   Blood transfusions: 11/29/2019, 01/02/2020, October 2021, April 2022, 12/26/2020, 11/12/2021, every 6 weeks   She does not want to receive Aranesp injections.    Because of her severe intolerance with the IV iron we are unable to do so.  She is also intolerant to oral iron therapy.   Current treatment: Observation and blood transfusions as needed    Return to clinic every 6 weeks for labs and blood transfusions.  Every 3 months for follow-up with me.    No orders of the defined types were placed in this encounter.  The patient has a good understanding of the overall plan. she agrees with it. she will call with any problems that may develop before the next visit  here. Total time spent: 30 mins including face to face time and time spent for planning, charting and co-ordination of care   Harriette Ohara, MD 05/22/22    I Gardiner Coins am scribing for Dr. Lindi Adie  I have reviewed the above documentation for accuracy and completeness, and I agree with the above.

## 2022-05-20 ENCOUNTER — Inpatient Hospital Stay: Payer: Medicare Other | Attending: Hematology and Oncology

## 2022-05-20 ENCOUNTER — Other Ambulatory Visit: Payer: Self-pay | Admitting: *Deleted

## 2022-05-20 ENCOUNTER — Encounter: Payer: Self-pay | Admitting: Hematology and Oncology

## 2022-05-20 DIAGNOSIS — D638 Anemia in other chronic diseases classified elsewhere: Secondary | ICD-10-CM

## 2022-05-20 DIAGNOSIS — Z881 Allergy status to other antibiotic agents status: Secondary | ICD-10-CM | POA: Diagnosis not present

## 2022-05-20 DIAGNOSIS — K648 Other hemorrhoids: Secondary | ICD-10-CM | POA: Insufficient documentation

## 2022-05-20 DIAGNOSIS — Z885 Allergy status to narcotic agent status: Secondary | ICD-10-CM | POA: Diagnosis not present

## 2022-05-20 DIAGNOSIS — K922 Gastrointestinal hemorrhage, unspecified: Secondary | ICD-10-CM | POA: Insufficient documentation

## 2022-05-20 DIAGNOSIS — M7989 Other specified soft tissue disorders: Secondary | ICD-10-CM | POA: Insufficient documentation

## 2022-05-20 DIAGNOSIS — Z79899 Other long term (current) drug therapy: Secondary | ICD-10-CM | POA: Diagnosis not present

## 2022-05-20 DIAGNOSIS — Z7901 Long term (current) use of anticoagulants: Secondary | ICD-10-CM | POA: Diagnosis not present

## 2022-05-20 DIAGNOSIS — D500 Iron deficiency anemia secondary to blood loss (chronic): Secondary | ICD-10-CM | POA: Insufficient documentation

## 2022-05-20 LAB — SAMPLE TO BLOOD BANK

## 2022-05-20 LAB — CMP (CANCER CENTER ONLY)
ALT: 18 U/L (ref 0–44)
AST: 42 U/L — ABNORMAL HIGH (ref 15–41)
Albumin: 3.1 g/dL — ABNORMAL LOW (ref 3.5–5.0)
Alkaline Phosphatase: 136 U/L — ABNORMAL HIGH (ref 38–126)
Anion gap: 6 (ref 5–15)
BUN: 16 mg/dL (ref 8–23)
CO2: 26 mmol/L (ref 22–32)
Calcium: 8.6 mg/dL — ABNORMAL LOW (ref 8.9–10.3)
Chloride: 106 mmol/L (ref 98–111)
Creatinine: 1.1 mg/dL — ABNORMAL HIGH (ref 0.44–1.00)
GFR, Estimated: 49 mL/min — ABNORMAL LOW (ref >60)
Glucose, Bld: 127 mg/dL — ABNORMAL HIGH (ref 70–99)
Potassium: 3.7 mmol/L (ref 3.5–5.1)
Sodium: 138 mmol/L (ref 135–145)
Total Bilirubin: 0.9 mg/dL (ref 0.3–1.2)
Total Protein: 5.7 g/dL — ABNORMAL LOW (ref 6.5–8.1)

## 2022-05-20 LAB — CBC WITH DIFFERENTIAL (CANCER CENTER ONLY)
Abs Immature Granulocytes: 0.03 10*3/uL (ref 0.00–0.07)
Basophils Absolute: 0.1 10*3/uL (ref 0.0–0.1)
Basophils Relative: 1 %
Eosinophils Absolute: 1.6 10*3/uL — ABNORMAL HIGH (ref 0.0–0.5)
Eosinophils Relative: 22 %
HCT: 26.5 % — ABNORMAL LOW (ref 36.0–46.0)
Hemoglobin: 7.9 g/dL — ABNORMAL LOW (ref 12.0–15.0)
Immature Granulocytes: 0 %
Lymphocytes Relative: 12 %
Lymphs Abs: 0.8 10*3/uL (ref 0.7–4.0)
MCH: 23.9 pg — ABNORMAL LOW (ref 26.0–34.0)
MCHC: 29.8 g/dL — ABNORMAL LOW (ref 30.0–36.0)
MCV: 80.1 fL (ref 80.0–100.0)
Monocytes Absolute: 0.6 10*3/uL (ref 0.1–1.0)
Monocytes Relative: 9 %
Neutro Abs: 3.9 10*3/uL (ref 1.7–7.7)
Neutrophils Relative %: 56 %
Platelet Count: 245 10*3/uL (ref 150–400)
RBC: 3.31 MIL/uL — ABNORMAL LOW (ref 3.87–5.11)
RDW: 21 % — ABNORMAL HIGH (ref 11.5–15.5)
WBC Count: 7 10*3/uL (ref 4.0–10.5)
nRBC: 0 % (ref 0.0–0.2)

## 2022-05-20 LAB — IRON AND IRON BINDING CAPACITY (CC-WL,HP ONLY)
Iron: 29 ug/dL (ref 28–170)
Saturation Ratios: 8 % — ABNORMAL LOW (ref 10.4–31.8)
TIBC: 364 ug/dL (ref 250–450)
UIBC: 335 ug/dL

## 2022-05-21 ENCOUNTER — Encounter: Payer: Self-pay | Admitting: Internal Medicine

## 2022-05-21 LAB — FERRITIN: Ferritin: 10 ng/mL — ABNORMAL LOW (ref 11–307)

## 2022-05-22 ENCOUNTER — Other Ambulatory Visit: Payer: Self-pay | Admitting: *Deleted

## 2022-05-22 ENCOUNTER — Inpatient Hospital Stay: Payer: Medicare Other

## 2022-05-22 ENCOUNTER — Inpatient Hospital Stay (HOSPITAL_BASED_OUTPATIENT_CLINIC_OR_DEPARTMENT_OTHER): Payer: Medicare Other | Admitting: Hematology and Oncology

## 2022-05-22 ENCOUNTER — Other Ambulatory Visit: Payer: Self-pay

## 2022-05-22 VITALS — BP 128/64 | HR 93 | Temp 97.3°F | Resp 18 | Ht 62.0 in | Wt 251.6 lb

## 2022-05-22 DIAGNOSIS — K648 Other hemorrhoids: Secondary | ICD-10-CM | POA: Diagnosis not present

## 2022-05-22 DIAGNOSIS — D500 Iron deficiency anemia secondary to blood loss (chronic): Secondary | ICD-10-CM

## 2022-05-22 DIAGNOSIS — Z7901 Long term (current) use of anticoagulants: Secondary | ICD-10-CM | POA: Diagnosis not present

## 2022-05-22 DIAGNOSIS — D649 Anemia, unspecified: Secondary | ICD-10-CM

## 2022-05-22 DIAGNOSIS — M7989 Other specified soft tissue disorders: Secondary | ICD-10-CM | POA: Diagnosis not present

## 2022-05-22 DIAGNOSIS — D638 Anemia in other chronic diseases classified elsewhere: Secondary | ICD-10-CM

## 2022-05-22 DIAGNOSIS — K922 Gastrointestinal hemorrhage, unspecified: Secondary | ICD-10-CM | POA: Diagnosis not present

## 2022-05-22 DIAGNOSIS — Z79899 Other long term (current) drug therapy: Secondary | ICD-10-CM | POA: Diagnosis not present

## 2022-05-22 LAB — PREPARE RBC (CROSSMATCH)

## 2022-05-22 MED ORDER — METHYLPREDNISOLONE SODIUM SUCC 40 MG IJ SOLR
40.0000 mg | Freq: Once | INTRAMUSCULAR | Status: AC
  Administered 2022-05-22: 40 mg via INTRAVENOUS
  Filled 2022-05-22: qty 1

## 2022-05-22 MED ORDER — DIPHENHYDRAMINE HCL 25 MG PO CAPS
25.0000 mg | ORAL_CAPSULE | Freq: Once | ORAL | Status: AC
  Administered 2022-05-22: 25 mg via ORAL
  Filled 2022-05-22: qty 1

## 2022-05-22 MED ORDER — SODIUM CHLORIDE 0.9% IV SOLUTION: 250.0000 mL | Freq: Once | INTRAVENOUS | Status: DC

## 2022-05-22 MED ORDER — HEPARIN SOD (PORK) LOCK FLUSH 100 UNIT/ML IV SOLN: 250.0000 [IU] | INTRAVENOUS | Status: DC | PRN

## 2022-05-22 MED ORDER — SODIUM CHLORIDE 0.9% FLUSH: 3.0000 mL | INTRAVENOUS | Status: DC | PRN

## 2022-05-22 MED ORDER — TRAMADOL HCL 50 MG PO TABS: 50.0000 mg | ORAL_TABLET | Freq: Three times a day (TID) | ORAL | 0 refills | Status: DC | PRN

## 2022-05-22 NOTE — Patient Instructions (Signed)
Blood Transfusion, Adult, Care After The following information offers guidance on how to care for yourself after your procedure. Your health care provider may also give you more specific instructions. If you have problems or questions, contact your health care provider. What can I expect after the procedure? After the procedure, it is common to have: Bruising and soreness where the IV was inserted. A headache. Follow these instructions at home: IV insertion site care     Follow instructions from your health care provider about how to take care of your IV insertion site. Make sure you: Wash your hands with soap and water for at least 20 seconds before and after you change your bandage (dressing). If soap and water are not available, use hand sanitizer. Change your dressing as told by your health care provider. Check your IV insertion site every day for signs of infection. Check for: Redness, swelling, or pain. Bleeding from the site. Warmth. Pus or a bad smell. General instructions Take over-the-counter and prescription medicines only as told by your health care provider. Rest as told by your health care provider. Return to your normal activities as told by your health care provider. Keep all follow-up visits. Lab tests may need to be done at certain periods to recheck your blood counts. Contact a health care provider if: You have itching or red, swollen areas of skin (hives). You have a fever or chills. You have pain in the head, back, or chest. You feel anxious or you feel weak after doing your normal activities. You have redness, swelling, warmth, or pain around the IV insertion site. You have blood coming from the IV insertion site that does not stop with pressure. You have pus or a bad smell coming from your IV insertion site. If you received your blood transfusion in an outpatient setting, you will be told whom to contact to report any reactions. Get help right away if: You  have symptoms of a serious allergic or immune system reaction, including: Trouble breathing or shortness of breath. Swelling of the face, feeling flushed, or widespread rash. Dark urine or blood in the urine. Fast heartbeat. These symptoms may be an emergency. Get help right away. Call 911. Do not wait to see if the symptoms will go away. Do not drive yourself to the hospital. Summary Bruising and soreness around the IV insertion site are common. Check your IV insertion site every day for signs of infection. Rest as told by your health care provider. Return to your normal activities as told by your health care provider. Get help right away for symptoms of a serious allergic or immune system reaction to the blood transfusion. This information is not intended to replace advice given to you by your health care provider. Make sure you discuss any questions you have with your health care provider. Document Revised: 09/19/2021 Document Reviewed: 09/19/2021 Elsevier Patient Education  2023 Elsevier Inc.  

## 2022-05-22 NOTE — Assessment & Plan Note (Addendum)
Hospitalization 08/11/2018: Severe GI bleed hemoglobin 4.9 (internal hemorrhoids and angiodysplasia of the colon)Received blood transfusions IV iron treatment: September 2019, March 2020, [facility] June 2020, Venofer January 2021    Toxicities of iron infusion:Patient got profound diarrhea which last for 2 to 3 days after Feraheme (and prescription for Lomotil for 3 days).  With Venofer she did okay but she still had profound diarrhea as well as lethargic   10/10/2019: Hemoglobin 6.8: MCV 82, RDW 18: 2 units of PRBC given on 10/11/2019. 11/20/2019: Hemoglobin 7.1, MCV 81.2 Iron studies ferritin 7, iron saturation 6%, absolute reticulocyte count 88.3 12/26/2020: Hemoglobin 7.9 (2 units PRBC) 08/14/2021: Hemoglobin 10.5, MCV 78.4, iron saturation 27%, ferritin 14 09/16/2021: Hemoglobin 9.8, MCV 81.3 11/17/2021: Hemoglobin 9.5, MCV 80.6, RDW 18.1   Blood transfusions: 11/29/2019, 01/02/2020, October 2021, April 2022, 12/26/2020, 11/12/2021, every 6 weeks   She does not want to receive Aranesp injections.    Because of her severe intolerance with the IV iron we are unable to do so.  She is also intolerant to oral iron therapy.   Current treatment: Observation and blood transfusions as needed    Return to clinic every 6 weeks for labs and blood transfusions.  Every 3 months for follow-up with me.

## 2022-05-25 LAB — TYPE AND SCREEN
ABO/RH(D): B POS
Antibody Screen: NEGATIVE
Unit division: 0
Unit division: 0

## 2022-05-25 LAB — BPAM RBC
Blood Product Expiration Date: 202312012359
Blood Product Expiration Date: 202312012359
ISSUE DATE / TIME: 202311171249
ISSUE DATE / TIME: 202311171249
Unit Type and Rh: 7300
Unit Type and Rh: 7300

## 2022-05-26 ENCOUNTER — Telehealth: Payer: Self-pay | Admitting: Hematology and Oncology

## 2022-05-26 NOTE — Telephone Encounter (Signed)
Scheduled appointment per 11/17 los. Left voicemail.

## 2022-07-03 ENCOUNTER — Inpatient Hospital Stay: Payer: Medicare Other

## 2022-07-03 ENCOUNTER — Other Ambulatory Visit: Payer: Self-pay

## 2022-07-03 ENCOUNTER — Inpatient Hospital Stay: Payer: Medicare Other | Attending: Hematology and Oncology

## 2022-07-03 DIAGNOSIS — Z79899 Other long term (current) drug therapy: Secondary | ICD-10-CM | POA: Insufficient documentation

## 2022-07-03 DIAGNOSIS — D638 Anemia in other chronic diseases classified elsewhere: Secondary | ICD-10-CM

## 2022-07-03 DIAGNOSIS — D500 Iron deficiency anemia secondary to blood loss (chronic): Secondary | ICD-10-CM | POA: Insufficient documentation

## 2022-07-03 DIAGNOSIS — K922 Gastrointestinal hemorrhage, unspecified: Secondary | ICD-10-CM | POA: Insufficient documentation

## 2022-07-03 LAB — CBC WITH DIFFERENTIAL (CANCER CENTER ONLY)
Abs Immature Granulocytes: 0.01 10*3/uL (ref 0.00–0.07)
Basophils Absolute: 0.1 10*3/uL (ref 0.0–0.1)
Basophils Relative: 1 %
Eosinophils Absolute: 0.9 10*3/uL — ABNORMAL HIGH (ref 0.0–0.5)
Eosinophils Relative: 16 %
HCT: 27.3 % — ABNORMAL LOW (ref 36.0–46.0)
Hemoglobin: 8.1 g/dL — ABNORMAL LOW (ref 12.0–15.0)
Immature Granulocytes: 0 %
Lymphocytes Relative: 14 %
Lymphs Abs: 0.8 10*3/uL (ref 0.7–4.0)
MCH: 24.1 pg — ABNORMAL LOW (ref 26.0–34.0)
MCHC: 29.7 g/dL — ABNORMAL LOW (ref 30.0–36.0)
MCV: 81.3 fL (ref 80.0–100.0)
Monocytes Absolute: 0.6 10*3/uL (ref 0.1–1.0)
Monocytes Relative: 11 %
Neutro Abs: 3.2 10*3/uL (ref 1.7–7.7)
Neutrophils Relative %: 58 %
Platelet Count: 278 10*3/uL (ref 150–400)
RBC: 3.36 MIL/uL — ABNORMAL LOW (ref 3.87–5.11)
RDW: 17.6 % — ABNORMAL HIGH (ref 11.5–15.5)
WBC Count: 5.5 10*3/uL (ref 4.0–10.5)
nRBC: 0 % (ref 0.0–0.2)

## 2022-07-03 LAB — IRON AND IRON BINDING CAPACITY (CC-WL,HP ONLY)
Iron: 32 ug/dL (ref 28–170)
Saturation Ratios: 8 % — ABNORMAL LOW (ref 10.4–31.8)
TIBC: 410 ug/dL (ref 250–450)
UIBC: 378 ug/dL (ref 148–442)

## 2022-07-03 LAB — FERRITIN: Ferritin: 6 ng/mL — ABNORMAL LOW (ref 11–307)

## 2022-07-03 LAB — SAMPLE TO BLOOD BANK

## 2022-07-03 NOTE — Progress Notes (Signed)
Patient Hgb 8.1 today. No shortness of breath, is not symptomatic. Patient said she feels fine and doesn't want blood transfusion today. Copy of labs given to family.

## 2022-07-27 ENCOUNTER — Other Ambulatory Visit: Payer: Self-pay | Admitting: Internal Medicine

## 2022-08-11 ENCOUNTER — Telehealth: Payer: Self-pay | Admitting: Hematology and Oncology

## 2022-08-11 NOTE — Telephone Encounter (Signed)
Cancelled appointment per 2/5 secure chat. Talked with the patients daughter a [name] and she is aware of the changes made to the patients upcoming appointment.

## 2022-08-14 ENCOUNTER — Other Ambulatory Visit: Payer: Self-pay

## 2022-08-14 ENCOUNTER — Inpatient Hospital Stay: Payer: Medicare Other

## 2022-08-14 ENCOUNTER — Inpatient Hospital Stay: Payer: Medicare Other | Attending: Hematology and Oncology

## 2022-08-14 ENCOUNTER — Inpatient Hospital Stay: Payer: Medicare Other | Admitting: Adult Health

## 2022-08-14 ENCOUNTER — Other Ambulatory Visit: Payer: Self-pay | Admitting: *Deleted

## 2022-08-14 ENCOUNTER — Inpatient Hospital Stay: Payer: Medicare Other | Admitting: Hematology and Oncology

## 2022-08-14 DIAGNOSIS — D638 Anemia in other chronic diseases classified elsewhere: Secondary | ICD-10-CM

## 2022-08-14 DIAGNOSIS — T454X5A Adverse effect of iron and its compounds, initial encounter: Secondary | ICD-10-CM | POA: Insufficient documentation

## 2022-08-14 DIAGNOSIS — D500 Iron deficiency anemia secondary to blood loss (chronic): Secondary | ICD-10-CM

## 2022-08-14 DIAGNOSIS — D509 Iron deficiency anemia, unspecified: Secondary | ICD-10-CM | POA: Diagnosis not present

## 2022-08-14 DIAGNOSIS — Z79899 Other long term (current) drug therapy: Secondary | ICD-10-CM | POA: Diagnosis not present

## 2022-08-14 LAB — CBC WITH DIFFERENTIAL (CANCER CENTER ONLY)
Abs Immature Granulocytes: 0.03 10*3/uL (ref 0.00–0.07)
Basophils Absolute: 0.1 10*3/uL (ref 0.0–0.1)
Basophils Relative: 1 %
Eosinophils Absolute: 0.7 10*3/uL — ABNORMAL HIGH (ref 0.0–0.5)
Eosinophils Relative: 10 %
HCT: 24.6 % — ABNORMAL LOW (ref 36.0–46.0)
Hemoglobin: 7.3 g/dL — ABNORMAL LOW (ref 12.0–15.0)
Immature Granulocytes: 0 %
Lymphocytes Relative: 12 %
Lymphs Abs: 0.8 10*3/uL (ref 0.7–4.0)
MCH: 22.5 pg — ABNORMAL LOW (ref 26.0–34.0)
MCHC: 29.7 g/dL — ABNORMAL LOW (ref 30.0–36.0)
MCV: 75.7 fL — ABNORMAL LOW (ref 80.0–100.0)
Monocytes Absolute: 0.6 10*3/uL (ref 0.1–1.0)
Monocytes Relative: 9 %
Neutro Abs: 4.9 10*3/uL (ref 1.7–7.7)
Neutrophils Relative %: 68 %
Platelet Count: 291 10*3/uL (ref 150–400)
RBC: 3.25 MIL/uL — ABNORMAL LOW (ref 3.87–5.11)
RDW: 18 % — ABNORMAL HIGH (ref 11.5–15.5)
WBC Count: 7.2 10*3/uL (ref 4.0–10.5)
nRBC: 0 % (ref 0.0–0.2)

## 2022-08-14 LAB — IRON AND IRON BINDING CAPACITY (CC-WL,HP ONLY)
Iron: 18 ug/dL — ABNORMAL LOW (ref 28–170)
Saturation Ratios: 4 % — ABNORMAL LOW (ref 10.4–31.8)
TIBC: 413 ug/dL (ref 250–450)
UIBC: 395 ug/dL (ref 148–442)

## 2022-08-14 LAB — SAMPLE TO BLOOD BANK

## 2022-08-14 LAB — FERRITIN: Ferritin: 6 ng/mL — ABNORMAL LOW (ref 11–307)

## 2022-08-14 LAB — PREPARE RBC (CROSSMATCH)

## 2022-08-14 MED ORDER — METHYLPREDNISOLONE SODIUM SUCC 40 MG IJ SOLR: 40.0000 mg | Freq: Once | INTRAMUSCULAR | Status: DC

## 2022-08-14 NOTE — Progress Notes (Signed)
2 units PRBCs scheduled today by Dr. Lindi Adie. Received verbal order from Wilber Bihari NP to transfuse w/ premeds Benadryl 25 mg po and solumedrol 40 mg IV as given w/past transfusions.  No notations of symptoms of volume overload with past transfusions.  Orders placed. Confirmed w/[facility] BB

## 2022-08-14 NOTE — Progress Notes (Signed)
Transfusion of 2 units PRBCs rescheduled from 08/14/22 to 08/1022 so that patient can receive both units on same day. Patient in agreement. Premed - Solumedrol 40 mg IV reordered as original order released and d/c'd.

## 2022-08-15 ENCOUNTER — Inpatient Hospital Stay: Payer: Medicare Other

## 2022-08-15 DIAGNOSIS — Z79899 Other long term (current) drug therapy: Secondary | ICD-10-CM | POA: Diagnosis not present

## 2022-08-15 DIAGNOSIS — D638 Anemia in other chronic diseases classified elsewhere: Secondary | ICD-10-CM

## 2022-08-15 DIAGNOSIS — D509 Iron deficiency anemia, unspecified: Secondary | ICD-10-CM | POA: Diagnosis not present

## 2022-08-15 DIAGNOSIS — T454X5A Adverse effect of iron and its compounds, initial encounter: Secondary | ICD-10-CM | POA: Diagnosis not present

## 2022-08-15 DIAGNOSIS — D500 Iron deficiency anemia secondary to blood loss (chronic): Secondary | ICD-10-CM

## 2022-08-15 MED ORDER — METHYLPREDNISOLONE SODIUM SUCC 40 MG IJ SOLR
40.0000 mg | Freq: Once | INTRAMUSCULAR | Status: AC
  Administered 2022-08-15: 40 mg via INTRAVENOUS
  Filled 2022-08-15: qty 1

## 2022-08-15 MED ORDER — SODIUM CHLORIDE 0.9% IV SOLUTION
250.0000 mL | Freq: Once | INTRAVENOUS | Status: AC
  Administered 2022-08-15: 250 mL via INTRAVENOUS
  Filled 2022-08-15: qty 250

## 2022-08-15 MED ORDER — DIPHENHYDRAMINE HCL 25 MG PO CAPS
25.0000 mg | ORAL_CAPSULE | Freq: Once | ORAL | Status: AC
  Administered 2022-08-15: 25 mg via ORAL
  Filled 2022-08-15: qty 1

## 2022-08-16 LAB — BPAM RBC
Blood Product Expiration Date: 202403062359
Blood Product Expiration Date: 202403062359
ISSUE DATE / TIME: 202402100816
ISSUE DATE / TIME: 202402100816
Unit Type and Rh: 7300
Unit Type and Rh: 7300

## 2022-08-16 LAB — TYPE AND SCREEN
ABO/RH(D): B POS
Antibody Screen: NEGATIVE
Unit division: 0
Unit division: 0

## 2022-08-18 ENCOUNTER — Encounter: Payer: Self-pay | Admitting: Internal Medicine

## 2022-08-19 MED ORDER — TRAMADOL HCL 50 MG PO TABS: 50.0000 mg | ORAL_TABLET | Freq: Three times a day (TID) | ORAL | 0 refills | Status: DC | PRN

## 2022-08-24 ENCOUNTER — Other Ambulatory Visit: Payer: Self-pay | Admitting: Internal Medicine

## 2022-10-07 ENCOUNTER — Encounter: Payer: Self-pay | Admitting: Hematology and Oncology

## 2022-10-07 ENCOUNTER — Other Ambulatory Visit: Payer: Self-pay | Admitting: *Deleted

## 2022-10-07 DIAGNOSIS — D500 Iron deficiency anemia secondary to blood loss (chronic): Secondary | ICD-10-CM

## 2022-10-08 ENCOUNTER — Inpatient Hospital Stay: Payer: Medicare Other | Attending: Hematology and Oncology

## 2022-10-08 DIAGNOSIS — Z79899 Other long term (current) drug therapy: Secondary | ICD-10-CM | POA: Diagnosis not present

## 2022-10-08 DIAGNOSIS — D500 Iron deficiency anemia secondary to blood loss (chronic): Secondary | ICD-10-CM

## 2022-10-08 DIAGNOSIS — D509 Iron deficiency anemia, unspecified: Secondary | ICD-10-CM | POA: Diagnosis not present

## 2022-10-08 LAB — CBC WITH DIFFERENTIAL (CANCER CENTER ONLY)
Abs Immature Granulocytes: 0.03 10*3/uL (ref 0.00–0.07)
Basophils Absolute: 0.1 10*3/uL (ref 0.0–0.1)
Basophils Relative: 2 %
Eosinophils Absolute: 0.8 10*3/uL — ABNORMAL HIGH (ref 0.0–0.5)
Eosinophils Relative: 15 %
HCT: 29.8 % — ABNORMAL LOW (ref 36.0–46.0)
Hemoglobin: 8.8 g/dL — ABNORMAL LOW (ref 12.0–15.0)
Immature Granulocytes: 1 %
Lymphocytes Relative: 16 %
Lymphs Abs: 0.9 10*3/uL (ref 0.7–4.0)
MCH: 23.7 pg — ABNORMAL LOW (ref 26.0–34.0)
MCHC: 29.5 g/dL — ABNORMAL LOW (ref 30.0–36.0)
MCV: 80.1 fL (ref 80.0–100.0)
Monocytes Absolute: 0.5 10*3/uL (ref 0.1–1.0)
Monocytes Relative: 9 %
Neutro Abs: 3.2 10*3/uL (ref 1.7–7.7)
Neutrophils Relative %: 57 %
Platelet Count: 261 10*3/uL (ref 150–400)
RBC: 3.72 MIL/uL — ABNORMAL LOW (ref 3.87–5.11)
RDW: 19.4 % — ABNORMAL HIGH (ref 11.5–15.5)
WBC Count: 5.5 10*3/uL (ref 4.0–10.5)
nRBC: 0 % (ref 0.0–0.2)

## 2022-10-08 LAB — CMP (CANCER CENTER ONLY)
ALT: 14 U/L (ref 0–44)
AST: 31 U/L (ref 15–41)
Albumin: 3.5 g/dL (ref 3.5–5.0)
Alkaline Phosphatase: 148 U/L — ABNORMAL HIGH (ref 38–126)
Anion gap: 7 (ref 5–15)
BUN: 18 mg/dL (ref 8–23)
CO2: 26 mmol/L (ref 22–32)
Calcium: 9.7 mg/dL (ref 8.9–10.3)
Chloride: 107 mmol/L (ref 98–111)
Creatinine: 1.1 mg/dL — ABNORMAL HIGH (ref 0.44–1.00)
GFR, Estimated: 49 mL/min — ABNORMAL LOW (ref >60)
Glucose, Bld: 132 mg/dL — ABNORMAL HIGH (ref 70–99)
Potassium: 4 mmol/L (ref 3.5–5.1)
Sodium: 140 mmol/L (ref 135–145)
Total Bilirubin: 1.1 mg/dL (ref 0.3–1.2)
Total Protein: 5.9 g/dL — ABNORMAL LOW (ref 6.5–8.1)

## 2022-10-08 LAB — IRON AND IRON BINDING CAPACITY (CC-WL,HP ONLY)
Iron: 35 ug/dL (ref 28–170)
Saturation Ratios: 8 % — ABNORMAL LOW (ref 10.4–31.8)
TIBC: 441 ug/dL (ref 250–450)
UIBC: 406 ug/dL (ref 148–442)

## 2022-10-08 LAB — SAMPLE TO BLOOD BANK

## 2022-10-09 ENCOUNTER — Other Ambulatory Visit: Payer: Self-pay | Admitting: *Deleted

## 2022-10-09 DIAGNOSIS — D500 Iron deficiency anemia secondary to blood loss (chronic): Secondary | ICD-10-CM

## 2022-10-09 LAB — BPAM RBC

## 2022-10-09 LAB — TYPE AND SCREEN: Antibody Screen: NEGATIVE

## 2022-10-09 LAB — FERRITIN: Ferritin: 6 ng/mL — ABNORMAL LOW (ref 11–307)

## 2022-10-09 LAB — PREPARE RBC (CROSSMATCH)

## 2022-10-09 NOTE — Progress Notes (Signed)
Verbal orders received from MD for pt to receive 1 unit of blood based on pt symptoms of shortness of breath, fatigue, and iron lab results (pt unable to tolerate IV iron). Orders placed. Appt scheduled, pt notified and verbalized understanding.

## 2022-10-10 ENCOUNTER — Inpatient Hospital Stay: Payer: Medicare Other

## 2022-10-10 DIAGNOSIS — D509 Iron deficiency anemia, unspecified: Secondary | ICD-10-CM | POA: Diagnosis not present

## 2022-10-10 DIAGNOSIS — D500 Iron deficiency anemia secondary to blood loss (chronic): Secondary | ICD-10-CM

## 2022-10-10 DIAGNOSIS — Z79899 Other long term (current) drug therapy: Secondary | ICD-10-CM | POA: Diagnosis not present

## 2022-10-10 LAB — TYPE AND SCREEN

## 2022-10-10 LAB — BPAM RBC: Unit Type and Rh: 7300

## 2022-10-10 MED ORDER — DIPHENHYDRAMINE HCL 25 MG PO CAPS
25.0000 mg | ORAL_CAPSULE | Freq: Once | ORAL | Status: AC
  Administered 2022-10-10: 25 mg via ORAL
  Filled 2022-10-10: qty 1

## 2022-10-10 MED ORDER — SODIUM CHLORIDE 0.9% IV SOLUTION
250.0000 mL | Freq: Once | INTRAVENOUS | Status: AC
  Administered 2022-10-10: 250 mL via INTRAVENOUS

## 2022-10-10 MED ORDER — ACETAMINOPHEN 325 MG PO TABS
650.0000 mg | ORAL_TABLET | Freq: Once | ORAL | Status: AC
  Administered 2022-10-10: 650 mg via ORAL
  Filled 2022-10-10: qty 2

## 2022-10-10 NOTE — Patient Instructions (Signed)
Blood Transfusion, Adult, Care After The following information offers guidance on how to care for yourself after your procedure. Your health care provider may also give you more specific instructions. If you have problems or questions, contact your health care provider. What can I expect after the procedure? After the procedure, it is common to have: Bruising and soreness where the IV was inserted. A headache. Follow these instructions at home: IV insertion site care     Follow instructions from your health care provider about how to take care of your IV insertion site. Make sure you: Wash your hands with soap and water for at least 20 seconds before and after you change your bandage (dressing). If soap and water are not available, use hand sanitizer. Change your dressing as told by your health care provider. Check your IV insertion site every day for signs of infection. Check for: Redness, swelling, or pain. Bleeding from the site. Warmth. Pus or a bad smell. General instructions Take over-the-counter and prescription medicines only as told by your health care provider. Rest as told by your health care provider. Return to your normal activities as told by your health care provider. Keep all follow-up visits. Lab tests may need to be done at certain periods to recheck your blood counts. Contact a health care provider if: You have itching or red, swollen areas of skin (hives). You have a fever or chills. You have pain in the head, back, or chest. You feel anxious or you feel weak after doing your normal activities. You have redness, swelling, warmth, or pain around the IV insertion site. You have blood coming from the IV insertion site that does not stop with pressure. You have pus or a bad smell coming from your IV insertion site. If you received your blood transfusion in an outpatient setting, you will be told whom to contact to report any reactions. Get help right away if: You  have symptoms of a serious allergic or immune system reaction, including: Trouble breathing or shortness of breath. Swelling of the face, feeling flushed, or widespread rash. Dark urine or blood in the urine. Fast heartbeat. These symptoms may be an emergency. Get help right away. Call 911. Do not wait to see if the symptoms will go away. Do not drive yourself to the hospital. Summary Bruising and soreness around the IV insertion site are common. Check your IV insertion site every day for signs of infection. Rest as told by your health care provider. Return to your normal activities as told by your health care provider. Get help right away for symptoms of a serious allergic or immune system reaction to the blood transfusion. This information is not intended to replace advice given to you by your health care provider. Make sure you discuss any questions you have with your health care provider. Document Revised: 09/19/2021 Document Reviewed: 09/19/2021 Elsevier Patient Education  2023 Elsevier Inc.  

## 2022-10-11 LAB — TYPE AND SCREEN
ABO/RH(D): B POS
Unit division: 0

## 2022-10-11 LAB — BPAM RBC: Blood Product Expiration Date: 202404262359

## 2022-10-20 ENCOUNTER — Encounter: Payer: Self-pay | Admitting: Internal Medicine

## 2022-10-21 MED ORDER — TRAMADOL HCL 50 MG PO TABS: 50.0000 mg | ORAL_TABLET | Freq: Three times a day (TID) | ORAL | 0 refills | Status: DC | PRN

## 2022-11-06 ENCOUNTER — Telehealth: Payer: Self-pay | Admitting: Hematology and Oncology

## 2022-11-11 ENCOUNTER — Inpatient Hospital Stay: Payer: Medicare Other

## 2022-11-12 ENCOUNTER — Encounter (HOSPITAL_COMMUNITY): Payer: Self-pay

## 2022-11-12 ENCOUNTER — Other Ambulatory Visit: Payer: Self-pay

## 2022-11-12 ENCOUNTER — Emergency Department (HOSPITAL_COMMUNITY): Payer: Medicare Other

## 2022-11-12 ENCOUNTER — Observation Stay (HOSPITAL_COMMUNITY)
Admission: EM | Admit: 2022-11-12 | Discharge: 2022-11-13 | Disposition: A | Discharge status: Home / Self Care | Payer: Medicare Other | Attending: Internal Medicine | Admitting: Internal Medicine

## 2022-11-12 DIAGNOSIS — N390 Urinary tract infection, site not specified: Secondary | ICD-10-CM | POA: Diagnosis not present

## 2022-11-12 DIAGNOSIS — Z6841 Body Mass Index (BMI) 40.0 and over, adult: Secondary | ICD-10-CM | POA: Insufficient documentation

## 2022-11-12 DIAGNOSIS — Z86711 Personal history of pulmonary embolism: Secondary | ICD-10-CM | POA: Insufficient documentation

## 2022-11-12 DIAGNOSIS — D638 Anemia in other chronic diseases classified elsewhere: Secondary | ICD-10-CM | POA: Diagnosis not present

## 2022-11-12 DIAGNOSIS — I130 Hypertensive heart and chronic kidney disease with heart failure and stage 1 through stage 4 chronic kidney disease, or unspecified chronic kidney disease: Secondary | ICD-10-CM | POA: Diagnosis not present

## 2022-11-12 DIAGNOSIS — R079 Chest pain, unspecified: Secondary | ICD-10-CM | POA: Diagnosis present

## 2022-11-12 DIAGNOSIS — Z86718 Personal history of other venous thrombosis and embolism: Secondary | ICD-10-CM | POA: Insufficient documentation

## 2022-11-12 DIAGNOSIS — Z79899 Other long term (current) drug therapy: Secondary | ICD-10-CM | POA: Diagnosis not present

## 2022-11-12 DIAGNOSIS — Z96652 Presence of left artificial knee joint: Secondary | ICD-10-CM | POA: Insufficient documentation

## 2022-11-12 DIAGNOSIS — D631 Anemia in chronic kidney disease: Secondary | ICD-10-CM | POA: Diagnosis not present

## 2022-11-12 DIAGNOSIS — R7989 Other specified abnormal findings of blood chemistry: Secondary | ICD-10-CM | POA: Diagnosis not present

## 2022-11-12 DIAGNOSIS — D649 Anemia, unspecified: Principal | ICD-10-CM

## 2022-11-12 DIAGNOSIS — I5032 Chronic diastolic (congestive) heart failure: Secondary | ICD-10-CM | POA: Diagnosis present

## 2022-11-12 DIAGNOSIS — R55 Syncope and collapse: Secondary | ICD-10-CM | POA: Insufficient documentation

## 2022-11-12 DIAGNOSIS — N1831 Chronic kidney disease, stage 3a: Secondary | ICD-10-CM | POA: Insufficient documentation

## 2022-11-12 DIAGNOSIS — Z87891 Personal history of nicotine dependence: Secondary | ICD-10-CM | POA: Insufficient documentation

## 2022-11-12 DIAGNOSIS — J90 Pleural effusion, not elsewhere classified: Secondary | ICD-10-CM | POA: Diagnosis not present

## 2022-11-12 DIAGNOSIS — R0789 Other chest pain: Secondary | ICD-10-CM | POA: Diagnosis not present

## 2022-11-12 DIAGNOSIS — Z7901 Long term (current) use of anticoagulants: Secondary | ICD-10-CM | POA: Diagnosis not present

## 2022-11-12 DIAGNOSIS — R531 Weakness: Secondary | ICD-10-CM | POA: Diagnosis present

## 2022-11-12 DIAGNOSIS — Z96619 Presence of unspecified artificial shoulder joint: Secondary | ICD-10-CM | POA: Insufficient documentation

## 2022-11-12 DIAGNOSIS — I10 Essential (primary) hypertension: Secondary | ICD-10-CM | POA: Diagnosis present

## 2022-11-12 HISTORY — DX: Urinary tract infection, site not specified: N39.0

## 2022-11-12 LAB — BASIC METABOLIC PANEL
Anion gap: 9 (ref 5–15)
BUN: 20 mg/dL (ref 8–23)
CO2: 22 mmol/L (ref 22–32)
Calcium: 8.5 mg/dL — ABNORMAL LOW (ref 8.9–10.3)
Chloride: 104 mmol/L (ref 98–111)
Creatinine, Ser: 1.12 mg/dL — ABNORMAL HIGH (ref 0.44–1.00)
GFR, Estimated: 48 mL/min — ABNORMAL LOW (ref >60)
Glucose, Bld: 124 mg/dL — ABNORMAL HIGH (ref 70–99)
Potassium: 3.6 mmol/L (ref 3.5–5.1)
Sodium: 135 mmol/L (ref 135–145)

## 2022-11-12 LAB — URINALYSIS, ROUTINE W REFLEX MICROSCOPIC
Bilirubin Urine: NEGATIVE
Glucose, UA: NEGATIVE mg/dL
Hgb urine dipstick: NEGATIVE
Ketones, ur: NEGATIVE mg/dL
Nitrite: POSITIVE — AB
Protein, ur: NEGATIVE mg/dL
Specific Gravity, Urine: 1.019 (ref 1.005–1.030)
WBC, UA: >50 WBC/hpf (ref 0–5)
pH: 5 (ref 5.0–8.0)

## 2022-11-12 LAB — CBC
HCT: 29.3 % — ABNORMAL LOW (ref 36.0–46.0)
Hemoglobin: 8.4 g/dL — ABNORMAL LOW (ref 12.0–15.0)
MCH: 24.1 pg — ABNORMAL LOW (ref 26.0–34.0)
MCHC: 28.7 g/dL — ABNORMAL LOW (ref 30.0–36.0)
MCV: 84 fL (ref 80.0–100.0)
Platelets: 262 10*3/uL (ref 150–400)
RBC: 3.49 MIL/uL — ABNORMAL LOW (ref 3.87–5.11)
RDW: 19.6 % — ABNORMAL HIGH (ref 11.5–15.5)
WBC: 6.5 10*3/uL (ref 4.0–10.5)
nRBC: 0 % (ref 0.0–0.2)

## 2022-11-12 LAB — BRAIN NATRIURETIC PEPTIDE: B Natriuretic Peptide: 121.3 pg/mL — ABNORMAL HIGH (ref 0.0–100.0)

## 2022-11-12 LAB — TYPE AND SCREEN
ABO/RH(D): B POS
Unit division: 0

## 2022-11-12 LAB — TROPONIN I (HIGH SENSITIVITY)
Troponin I (High Sensitivity): 30 ng/L — ABNORMAL HIGH (ref <18)
Troponin I (High Sensitivity): 9 ng/L (ref <18)

## 2022-11-12 LAB — PREPARE RBC (CROSSMATCH)

## 2022-11-12 LAB — CBG MONITORING, ED: Glucose-Capillary: 120 mg/dL — ABNORMAL HIGH (ref 70–99)

## 2022-11-12 MED ORDER — TRAZODONE HCL 50 MG PO TABS: 25.0000 mg | ORAL_TABLET | Freq: Every evening | ORAL | Status: DC | PRN

## 2022-11-12 MED ORDER — ACETAMINOPHEN 650 MG RE SUPP: 650.0000 mg | Freq: Four times a day (QID) | RECTAL | Status: DC | PRN

## 2022-11-12 MED ORDER — ONDANSETRON HCL 4 MG/2ML IJ SOLN: 4.0000 mg | Freq: Four times a day (QID) | INTRAMUSCULAR | Status: DC | PRN

## 2022-11-12 MED ORDER — ONDANSETRON HCL 4 MG PO TABS: 4.0000 mg | ORAL_TABLET | Freq: Four times a day (QID) | ORAL | Status: DC | PRN

## 2022-11-12 MED ORDER — APIXABAN 2.5 MG PO TABS
2.5000 mg | ORAL_TABLET | Freq: Two times a day (BID) | ORAL | Status: DC
  Administered 2022-11-12 – 2022-11-13 (×2): 2.5 mg via ORAL
  Filled 2022-11-12 (×2): qty 1

## 2022-11-12 MED ORDER — SODIUM CHLORIDE 0.9% IV SOLUTION: Freq: Once | INTRAVENOUS | Status: AC

## 2022-11-12 MED ORDER — HYDRALAZINE HCL 20 MG/ML IJ SOLN: 5.0000 mg | Freq: Four times a day (QID) | INTRAMUSCULAR | Status: DC | PRN

## 2022-11-12 MED ORDER — DOCUSATE SODIUM 100 MG PO CAPS
100.0000 mg | ORAL_CAPSULE | Freq: Two times a day (BID) | ORAL | Status: DC
  Filled 2022-11-12 (×2): qty 1

## 2022-11-12 MED ORDER — POLYETHYLENE GLYCOL 3350 17 G PO PACK: 17.0000 g | PACK | Freq: Every day | ORAL | Status: DC | PRN

## 2022-11-12 MED ORDER — ALBUTEROL SULFATE (2.5 MG/3ML) 0.083% IN NEBU: 2.5000 mg | INHALATION_SOLUTION | RESPIRATORY_TRACT | Status: DC | PRN

## 2022-11-12 MED ORDER — ACETAMINOPHEN 325 MG PO TABS: 650.0000 mg | ORAL_TABLET | Freq: Four times a day (QID) | ORAL | Status: DC | PRN

## 2022-11-12 MED ORDER — SODIUM CHLORIDE 0.9 % IV SOLN
1.0000 g | Freq: Once | INTRAVENOUS | Status: AC
  Administered 2022-11-12: 1 g via INTRAVENOUS
  Filled 2022-11-12: qty 10

## 2022-11-12 NOTE — H&P (Signed)
History and Physical  Chelsea Keller RUE:[id number] DOB: 03/06/1935 DOA: 11/12/2022  PCP: [name], MD   Chief Complaint: dizziness and funny feeling in chest   HPI: Chelsea Keller is a 88 y.o. female with medical history significant for essential hypertension, chronic diastolic congestive heart failure, morbid obesity, chronic blood loss anemia requiring every 6 with blood transfusions being admitted to the hospital with dizziness, and chest discomfort.  Patient states she last got a blood transfusion with her hematologist on 10/09/2022, has been doing well.  This morning, she was sitting in her chair bent down to pick up something off the floor and got quite dizzy, after this she waited a few minutes and tried to stand up and got quite dizzy again.  During that time, she also had a "funny feeling in her chest "which has since resolved.  ED Course: She presented to the emergency department for evaluation, vital signs were unremarkable except for blood pressure elevated 147/50.  Lab work was pursued, shows creatinine stable 1.12, hemoglobin 8.4, initial troponin 30 repeat troponin after 2 hours 9.  ER provider spoke with cardiology who recommends trending troponin and consult them if needed.  ER provider also spoke with hematology, who recommends blood transfusion.  Urinalysis was also indicative of possible urinary tract infection, so patient was given a dose of IV Rocephin.  Review of Systems: Please see HPI for pertinent positives and negatives. A complete 10 system review of systems are otherwise negative.  Past Medical History:  Diagnosis Date   Allergy    Anemia    Anxiety    Arthritis    Arthritis of sacroiliac joint of both sides 11/12/2017   Bilateral pulmonary embolism (HCC) 06/23/2017   Chronic diastolic CHF (congestive heart failure) (HCC)    a. Echo 1/16:  mild LVH, EF normal, grade 1 DD, MAC   Chronic venous insufficiency    chronic LE edema   DDD (degenerative disc  disease), lumbar 11/12/2017   Degenerative joint disease (DJD) of hip, Bilateral  11/12/2017   Depression    Fibromyalgia    constant pain   Hx of cardiac catheterization    a. [facility] in [place] "ok" per patient with mild plaque in a single vessel - records not available   Hx of cardiovascular stress test    a. Nuclear study in 2008 normal   Hx of colonic polyps    Hypertension    Hypertriglyceridemia    Impaired fasting glucose    PONV (postoperative nausea and vomiting)    PUD (peptic ulcer disease)    hx of gastric ulcer   Pulmonary emboli (HCC) 06/2017   Vitamin B12 deficiency    Past Surgical History:  Procedure Laterality Date   ABDOMINAL HYSTERECTOMY     CATARACT EXTRACTION  10/2003   OD   CHOLECYSTECTOMY     COLONOSCOPY W/ POLYPECTOMY     COLONOSCOPY WITH PROPOFOL N/A 10/15/2014   Procedure: COLONOSCOPY WITH PROPOFOL;  Surgeon: Meryl Dare, MD;  Location: [facility] ENDOSCOPY;  Service: Endoscopy;  Laterality: N/A;   COLONOSCOPY WITH PROPOFOL N/A 03/23/2018   Procedure: COLONOSCOPY WITH PROPOFOL;  Surgeon: Iva Boop, MD;  Location: [facility] ENDOSCOPY;  Service: Endoscopy;  Laterality: N/A;   ESOPHAGOGASTRODUODENOSCOPY (EGD) WITH PROPOFOL N/A 10/15/2014   Procedure: ESOPHAGOGASTRODUODENOSCOPY (EGD) WITH PROPOFOL;  Surgeon: Meryl Dare, MD;  Location: [facility] ENDOSCOPY;  Service: Endoscopy;  Laterality: N/A;   EXTERNAL FIXATION ANKLE FRACTURE     Fx.  left ankle-fixation with pins later removed  sec to infection 1985   EXTERNAL FIXATION WRIST FRACTURE  1985   left with pins   EYE SURGERY     FEMUR FRACTURE SURGERY  06/2009   FRACTURE SURGERY     HARDWARE REMOVAL Left 10/05/2013   Procedure: HARDWARE REMOVAL LEFT DISTAL FEMUR;  Surgeon: Budd Palmer, MD;  Location: [facility] OR;  Service: Orthopedics;  Laterality: Left;   HEMIARTHROPLASTY SHOULDER FRACTURE  06/2009   HOT HEMOSTASIS N/A 03/23/2018   Procedure: HOT HEMOSTASIS (ARGON PLASMA COAGULATION/BICAP);  Surgeon: Iva Boop, MD;   Location: [facility] ENDOSCOPY;  Service: Endoscopy;  Laterality: N/A;   JOINT REPLACEMENT     STERIOD INJECTION Right 10/05/2013   Procedure: STEROID INJECTION;  Surgeon: Budd Palmer, MD;  Location: [facility] OR;  Service: Orthopedics;  Laterality: Right;   TONSILLECTOMY     TONSILLECTOMY     TOTAL KNEE ARTHROPLASTY  03/09   left    Social History:  reports that she quit smoking about 29 years ago. Her smoking use included cigarettes. She has a 40.00 pack-year smoking history. She has been exposed to tobacco smoke. She has never used smokeless tobacco. She reports that she does not drink alcohol and does not use drugs.   Allergies  Allergen Reactions   Codeine Sulfate Shortness Of Breath   Celecoxib Other (See Comments)    Caused vaginal bleeding   Erythromycin Base Nausea And Vomiting   Spironolactone Nausea Only and Other (See Comments)    Blurred vision, Upset stomach, lethargy    Family History  Problem Relation Age of Onset   Depression Mother    Cancer Mother        uterine cancer   Heart attack Father    Cancer Brother        prostate cancer   Diabetes Maternal Aunt    Arthritis Brother    Asthma Brother    Stroke Maternal Grandmother    Pulmonary embolism Daughter      Prior to Admission medications   Medication Sig Start Date End Date Taking? Authorizing Provider  acetaminophen (TYLENOL) 500 MG tablet Take 1,000-1,500 mg by mouth every 6 (six) hours as needed for moderate pain or headache (pain).     [provider]  allopurinol (ZYLOPRIM) 100 MG tablet Take 1 tablet (100 mg total) by mouth daily. 11/12/21   [name], MD  allopurinol (ZYLOPRIM) 300 MG tablet Take 1 tablet (300 mg total) by mouth daily. 11/12/21   [name], MD  apixaban (ELIQUIS) 2.5 MG TABS tablet Take 1 tablet (2.5 mg total) by mouth 2 (two) times daily. 12/31/21   [name], MD  colchicine 0.6 MG tablet Take 1 tablet (0.6 mg total) by mouth 2 (two) times daily. 04/07/22    [name], MD  Crisaborole Pam Drown) 2 % OINT Apply to skin daily 11/18/20   Deirdre Evener, MD  fexofenadine (ALLEGRA) 60 MG tablet Take 1 tablet (60 mg total) by mouth 2 (two) times daily. 04/25/22   Dione Booze, MD  fluconazole (DIFLUCAN) 150 MG tablet Take 150 mg by mouth once. 02/05/21   [provider]  gabapentin (NEURONTIN) 300 MG capsule TAKE 1 CAPSULE IN THE MORNING AND 2 CAPSULES AT BEDTIME 07/27/22   Tillman Abide I, MD  hydrOXYzine (ATARAX) 10 MG tablet TAKE 1 TABLET BY MOUTH THREE TIMES A DAY AS NEEDED 08/25/22   Tillman Abide I, MD  KLOR-CON M20 20 MEQ tablet TAKE 2 TABLETS (40 MEQ TOTAL) DAILY 04/28/22  [name], MD  ondansetron (ZOFRAN) 4 MG tablet Take 1 tablet (4 mg total) by mouth every 8 (eight) hours as needed for nausea or vomiting. 07/19/20   [name], MD  predniSONE (DELTASONE) 10 MG tablet Take by mouth. 04/16/22   [provider]  torsemide (DEMADEX) 20 MG tablet Take 20 mg by mouth daily as needed (for fluid).    [provider]  traMADol (ULTRAM) 50 MG tablet Take 1 tablet (50 mg total) by mouth 3 (three) times daily as needed for moderate pain. 10/21/22   [name], MD  triamterene-hydrochlorothiazide (MAXZIDE-25) 37.5-25 MG tablet TAKE 1 TABLET DAILY 03/30/22   [name], MD    Physical Exam: BP (!) 149/94 (BP Location: Left Arm)   Pulse 65   Temp 98.6 F (37 C) (Oral)   Resp 13   Wt 114 kg   SpO2 94%   BMI 45.97 kg/m   General:  Alert, oriented, calm, in no acute distress, pleasant and cooperative.  Denies any chest pain, dizziness or other discomfort. Eyes: EOMI, clear conjuctivae, white sclerea Neck: supple, no masses, trachea mildline  Cardiovascular: RRR, no murmurs or rubs, no peripheral edema  Respiratory: clear to auscultation bilaterally, no wheezes, no crackles  Abdomen: soft, nontender, nondistended, normal bowel tones heard  Skin: dry, no rashes  Musculoskeletal: no joint  effusions, normal range of motion  Psychiatric: appropriate affect, normal speech  Neurologic: extraocular muscles intact, clear speech, moving all extremities with intact sensorium          Labs on Admission:  Basic Metabolic Panel: Recent Labs  Lab 11/12/22 0705  NA 135  K 3.6  CL 104  CO2 22  GLUCOSE 124*  BUN 20  CREATININE 1.12*  CALCIUM 8.5*   Liver Function Tests: No results for input(s): "AST", "ALT", "ALKPHOS", "BILITOT", "PROT", "ALBUMIN" in the last 168 hours. No results for input(s): "LIPASE", "AMYLASE" in the last 168 hours. No results for input(s): "AMMONIA" in the last 168 hours. CBC: Recent Labs  Lab 11/12/22 0705  WBC 6.5  HGB 8.4*  HCT 29.3*  MCV 84.0  PLT 262   Cardiac Enzymes: No results for input(s): "CKTOTAL", "CKMB", "CKMBINDEX", "TROPONINI" in the last 168 hours.  BNP (last 3 results) Recent Labs    11/12/22 0705  BNP 121.3*    ProBNP (last 3 results) No results for input(s): "PROBNP" in the last 8760 hours.  CBG: Recent Labs  Lab 11/12/22 0723  GLUCAP 120*    Radiological Exams on Admission: DG Chest 2 View  Result Date: 11/12/2022 CLINICAL DATA:  Chest pain EXAM: CHEST - 2 VIEW COMPARISON:  CXR 04/25/22 FINDINGS: Trace bilateral pleural effusions. Cardiomegaly. No pneumothorax. No focal airspace opacity. No radiographically apparent displaced rib fractures. Visualized upper abdomen is unremarkable. There are severe degenerative changes of the right glenohumeral joint with erosive change of the right humerus, this is unchanged compared to 10/16/2020. IMPRESSION: Borderline cardiomegaly with trace bilateral pleural effusions. Electronically Signed   By: Lorenza Cambridge M.D.   On: 11/12/2022 10:19    Assessment/Plan 88 year old female with a history of diastolic congestive heart failure, recurrent VTE on Eliquis, chronic blood loss anemia requiring intermittent blood transfusion being admitted to the hospital with dizziness, symptomatic  anemia and chest discomfort.    Chest discomfort-has resolved, likely demand ischemia in the setting of anemia.  No hypoxia, tachycardia, anticoagulation.  Extremely low likelihood of PE. -Inpatient admission  -Monitor closely on telemetry -Second troponin has down  trended to normal range, continue to trend for total of 3 troponins  Anemia of chronic disease-patient is followed by Dr. Pamelia Hoit, and receives blood transfusions approximately every 6 weeks.  Last blood transfusion was 10/09/2022. -Hematology added to treatment team, transfuse 1 unit of blood -Recheck hemoglobin in the morning  History of recurrent VTE-continue home Eliquis  CKD 3-renally dose medications as appropriate, renal function appears to be at baseline  Abnormal urinalysis-patient is afebrile, with normal white blood cell count and no urinary symptoms.  No indication for antibiotics.      Morbid obesity (HCC)-BMI 46, complicating all aspects of care   Chronic diastolic CHF (congestive heart failure) (HCC)-continue home medications once reconciled, no indication of acute heart failure    Hypertension-continue home medications once reconciled  DVT prophylaxis: Continue home Eliquis    Code Status: Full Code  Consults called: None  Admission status: The appropriate patient status for this patient is INPATIENT. Inpatient status is judged to be reasonable and necessary in order to provide the required intensity of service to ensure the patient's safety. The patient's presenting symptoms, physical exam findings, and initial radiographic and laboratory data in the context of their chronic comorbidities is felt to place them at high risk for further clinical deterioration. Furthermore, it is not anticipated that the patient will be medically stable for discharge from the hospital within 2 midnights of admission.    I certify that at the point of admission it is my clinical judgment that the patient will require inpatient  hospital care spanning beyond 2 midnights from the point of admission due to high intensity of service, high risk for further deterioration and high frequency of surveillance required  Time spent: 49 minutes  Ayushi Pla Sharlette Dense MD [facility] Pager [phone]  If 7PM-7AM, please contact night-coverage [email or web address] Password [facility]  11/12/2022, 10:49 AM

## 2022-11-12 NOTE — ED Provider Notes (Signed)
[place] EMERGENCY DEPARTMENT AT [facility] Provider Note   CSN: [id number] Arrival date & time: 11/12/22  6295     History  No chief complaint on file.   Chelsea Keller is a 88 y.o. female.  The history is provided by the patient and medical records. No language interpreter was used.     88 year old female with multiple comorbidities which includes hypertension, CHF, CKD, prior PE currently on Eliquis, history of heart cath brought in by family member for evaluation of weakness.  Patient report this morning when she wakes up her usual she noticed a napkin on the floor and as she reached down to patient she reports her lightheadedness and dizziness.  States that she felt like she was going to pass out.  She sat there for a moment, collected self, and as she was standing up, she fell back to the chair with exam lightheadedness sensation.  She also felt a funny sensation in her chest and described as mild discomfort without pain.  She did felt nauseous without vomiting and feels like she cannot take a deep breath.  Symptoms been waxing waning and her daughter brought her here for further assessment.  Patient started mention patient has history of anemia of unknown etiology and required blood transfusion every 4 to 6 weeks.  Her last blood transfusion was 4 weeks ago.  She is already receiving blood transfusion even when hemoglobin is around 8-9.  Her hematologist is Dr. Dortha Kern.  Patient also denies any recent medication changes, recent sickness, or change in her diet.  She denies any fever, cold symptoms, urinary symptoms, any new focal numbness or focal weakness or confusion.  She has been very compliant with her Eliquis.    Home Medications Prior to Admission medications   Medication Sig Start Date End Date Taking? Authorizing Provider  acetaminophen (TYLENOL) 500 MG tablet Take 1,000-1,500 mg by mouth every 6 (six) hours as needed for moderate pain or headache (pain).      [provider]  allopurinol (ZYLOPRIM) 100 MG tablet Take 1 tablet (100 mg total) by mouth daily. 11/12/21   [name], MD  allopurinol (ZYLOPRIM) 300 MG tablet Take 1 tablet (300 mg total) by mouth daily. 11/12/21   [name], MD  apixaban (ELIQUIS) 2.5 MG TABS tablet Take 1 tablet (2.5 mg total) by mouth 2 (two) times daily. 12/31/21   [name], MD  colchicine 0.6 MG tablet Take 1 tablet (0.6 mg total) by mouth 2 (two) times daily. 04/07/22   [name], MD  Crisaborole Pam Drown) 2 % OINT Apply to skin daily 11/18/20   Deirdre Evener, MD  fexofenadine (ALLEGRA) 60 MG tablet Take 1 tablet (60 mg total) by mouth 2 (two) times daily. 04/25/22   Dione Booze, MD  fluconazole (DIFLUCAN) 150 MG tablet Take 150 mg by mouth once. 02/05/21   [provider]  gabapentin (NEURONTIN) 300 MG capsule TAKE 1 CAPSULE IN THE MORNING AND 2 CAPSULES AT BEDTIME 07/27/22   Tillman Abide I, MD  hydrOXYzine (ATARAX) 10 MG tablet TAKE 1 TABLET BY MOUTH THREE TIMES A DAY AS NEEDED 08/25/22   Tillman Abide I, MD  KLOR-CON M20 20 MEQ tablet TAKE 2 TABLETS (40 MEQ TOTAL) DAILY 04/28/22   [name], MD  ondansetron (ZOFRAN) 4 MG tablet Take 1 tablet (4 mg total) by mouth every 8 (eight) hours as needed for nausea or vomiting. 07/19/20   [name], MD  predniSONE (  DELTASONE) 10 MG tablet Take by mouth. 04/16/22   [provider]  torsemide (DEMADEX) 20 MG tablet Take 20 mg by mouth daily as needed (for fluid).    [provider]  traMADol (ULTRAM) 50 MG tablet Take 1 tablet (50 mg total) by mouth 3 (three) times daily as needed for moderate pain. 10/21/22   [name], MD  triamterene-hydrochlorothiazide (MAXZIDE-25) 37.5-25 MG tablet TAKE 1 TABLET DAILY 03/30/22   [name], MD      Allergies    Codeine sulfate, Celecoxib, Erythromycin base, and Spironolactone    Review of Systems   Review of Systems  All other systems  reviewed and are negative.   Physical Exam Updated Vital Signs BP (!) 147/50   Pulse 67   Temp 98.6 F (37 C) (Oral)   Resp (!) 24   Wt 114 kg   SpO2 99%   BMI 45.97 kg/m  Physical Exam Vitals and nursing note reviewed.  Constitutional:      General: She is not in acute distress.    Appearance: She is well-developed. She is obese.     Comments: Obese female laying in bed appears to be in no acute discomfort.  HENT:     Head: Atraumatic.  Eyes:     Extraocular Movements: Extraocular movements intact.     Conjunctiva/sclera: Conjunctivae normal.     Pupils: Pupils are equal, round, and reactive to light.  Cardiovascular:     Rate and Rhythm: Rhythm irregular.     Pulses: Normal pulses.     Heart sounds: Normal heart sounds.  Pulmonary:     Effort: Pulmonary effort is normal.     Breath sounds: No wheezing, rhonchi or rales.  Abdominal:     Palpations: Abdomen is soft.     Tenderness: There is no abdominal tenderness.  Musculoskeletal:     Cervical back: Neck supple.     Right lower leg: Edema present.     Left lower leg: Edema present.     Comments: Able to move all 4 extremities  Skin:    General: Skin is warm.     Findings: No rash.  Neurological:     Mental Status: She is alert. Mental status is at baseline.     GCS: GCS eye subscore is 4. GCS verbal subscore is 5. GCS motor subscore is 6.     Cranial Nerves: Cranial nerves 2-12 are intact.     Sensory: Sensation is intact.     Motor: Motor function is intact.  Psychiatric:        Mood and Affect: Mood normal.     ED Results / Procedures / Treatments   Labs (all labs ordered are listed, but only abnormal results are displayed) Labs Reviewed  BASIC METABOLIC PANEL - Abnormal; Notable for the following components:      Result Value   Glucose, Bld 124 (*)    Creatinine, Ser 1.12 (*)    Calcium 8.5 (*)    GFR, Estimated 48 (*)    All other components within normal limits  CBC - Abnormal; Notable for the  following components:   RBC 3.49 (*)    Hemoglobin 8.4 (*)    HCT 29.3 (*)    MCH 24.1 (*)    MCHC 28.7 (*)    RDW 19.6 (*)    All other components within normal limits  URINALYSIS, ROUTINE W REFLEX MICROSCOPIC - Abnormal; Notable for the following components:   APPearance CLOUDY (*)  Nitrite POSITIVE (*)    Leukocytes,Ua MODERATE (*)    Bacteria, UA MANY (*)    All other components within normal limits  BRAIN NATRIURETIC PEPTIDE - Abnormal; Notable for the following components:   B Natriuretic Peptide 121.3 (*)    All other components within normal limits  BASIC METABOLIC PANEL - Abnormal; Notable for the following components:   Glucose, Bld 107 (*)    Creatinine, Ser 1.02 (*)    Calcium 8.5 (*)    GFR, Estimated 54 (*)    All other components within normal limits  CBC - Abnormal; Notable for the following components:   RBC 3.70 (*)    Hemoglobin 8.9 (*)    HCT 30.3 (*)    MCH 24.1 (*)    MCHC 29.4 (*)    RDW 19.4 (*)    All other components within normal limits  CBG MONITORING, ED - Abnormal; Notable for the following components:   Glucose-Capillary 120 (*)    All other components within normal limits  TROPONIN I (HIGH SENSITIVITY) - Abnormal; Notable for the following components:   Troponin I (High Sensitivity) 30 (*)    All other components within normal limits  TYPE AND SCREEN  PREPARE RBC (CROSSMATCH)  TROPONIN I (HIGH SENSITIVITY)    EKG EKG Interpretation  Date/Time:  Thursday Nov 12 2022 07:30:03 EDT Ventricular Rate:  70 PR Interval:    QRS Duration: 104 QT Interval:  424 QTC Calculation: 458 R Axis:   -25 Text Interpretation: Normal sinus rhythm Ventricular premature complex Borderline left axis deviation Low voltage, precordial leads Consider anterior infarct Confirmed by Gwyneth Sprout ([id number]) on 11/12/2022 8:06:46 AM  Radiology MR BRAIN WO CONTRAST  Result Date: 11/13/2022 CLINICAL DATA:  Mental status change, unknown cause. EXAM: MRI HEAD  WITHOUT CONTRAST TECHNIQUE: Multiplanar, multiecho pulse sequences of the brain and surrounding structures were obtained without intravenous contrast. COMPARISON:  Head CT 07/17/2016. FINDINGS: Brain: No acute infarct or hemorrhage. No mass or midline shift. No hydrocephalus or extra-axial collection. No abnormal susceptibility. Vascular: Normal flow voids. Skull and upper cervical spine: Normal marrow signal. Sinuses/Orbits: Unremarkable. Other: None. IMPRESSION: No acute intracranial process. Electronically Signed   By: Orvan Falconer M.D.   On: 11/13/2022 10:59    Procedures .Critical Care  Performed by: Fayrene Helper, PA-C Authorized by: Fayrene Helper, PA-C   Critical care provider statement:    Critical care time (minutes):  35   Critical care was time spent personally by me on the following activities:  Development of treatment plan with patient or surrogate, discussions with consultants, evaluation of patient's response to treatment, examination of patient, ordering and review of laboratory studies, ordering and review of radiographic studies, ordering and performing treatments and interventions, pulse oximetry, re-evaluation of patient's condition and review of old charts     Medications Ordered in ED Medications  cefTRIAXone (ROCEPHIN) 1 g in sodium chloride 0.9 % 100 mL IVPB (0 g Intravenous Stopped 11/13/22 1503)  0.9 %  sodium chloride infusion (Manually program via Guardrails IV Fluids) (0 mLs Intravenous Stopped 11/13/22 1503)  hydrOXYzine (ATARAX) tablet 10 mg (10 mg Oral Given 11/13/22 0059)  hydrOXYzine (ATARAX) tablet 10 mg (10 mg Oral Given 11/13/22 1020)    ED Course/ Medical Decision Making/ A&P                             Medical Decision Making Amount and/or Complexity of Data Reviewed Labs: ordered.  Radiology: ordered.  Risk Prescription drug management. Decision regarding hospitalization.   BP (!) 147/50   Pulse 67   Temp 98.6 F (37 C) (Oral)   Resp (!) 24    Wt 114 kg   SpO2 99%   BMI 45.97 kg/m   68:69 AM  89 year old female with multiple comorbidities which includes hypertension, CHF, CKD, prior PE currently on Eliquis, history of heart cath brought in by family member for evaluation of weakness.  Patient report this morning when she wakes up her usual she noticed a napkin on the floor and as she reached down to patient she reports her lightheadedness and dizziness.  States that she felt like she was going to pass out.  She sat there for a moment, collected self, and as she was standing up, she fell back to the chair with exam lightheadedness sensation.  She also felt a funny sensation in her chest and described as mild discomfort without pain.  She did felt nauseous without vomiting and feels like she cannot take a deep breath.  Symptoms been waxing waning and her daughter brought her here for further assessment.  Patient started mention patient has history of anemia of unknown etiology and required blood transfusion every 4 to 6 weeks.  Her last blood transfusion was 4 weeks ago.  She is already receiving blood transfusion even when hemoglobin is around 8-9.  Her hematologist is Dr. Pamelia Hoit.  Patient also denies any recent medication changes, recent sickness, or change in her diet.  She denies any fever, cold symptoms, urinary symptoms, any new focal numbness or focal weakness or confusion.  She has been very compliant with her Eliquis.    On exam this is an obese elderly female laying bed appears to be in no acute heart with irregularly irregular rhythm, lungs with decreased breath sounds.  Abdomen is soft and nontender.  Trace peripheral edema noted.  Able to move all 4 extremities.  No obvious focal neurodeficit noted.  Vital signs overall reassuring, mild tachypnea with a documented respiratory of 24 while on my exam, she is breathing approximately 16 breaths/min.  Blood pressure is 147/50.  She is afebrile no hypoxia.  EMR review, patient does  follow-up with hematologist Dr. Pamelia Hoit.  Does have history of iron deficiency anemia due to chronic blood loss.  Was hospitalized in 2020 due to severe GI bleed secondary to internal hemorrhoids and angiodysplasia of the colon.  At that time hemoglobin was 4 point manage she will receive blood transfusion.  Patient is allergic to iron IV infusion.  She is to see blood transfusion as needed and does follow-up to the clinic in the next 6 weeks for labs and blood transfusion.  -Labs ordered, independently viewed and interpreted by me.  Labs remarkable for urinalysis shows cloudy urine with moderate leukocyte esterase and many bacteria with positive nitrite consistent with UTI.  Patient does not have any true urinary tract symptoms however it may manifest in dizziness and lightheadedness complaint.  Patient given Rocephin as treatment.  Hemoglobin today is 8.4 which is a drop from prior.  Patient states she did receive blood transfusion when it's in the 8s range.  I will reach out to her hematologist for recommendation.  Unfortunately her troponin is elevated at 30. -The patient was maintained on a cardiac monitor.  I personally viewed and interpreted the cardiac monitored which showed an underlying rhythm of: sinus rhythm with PAC, less likely afib -Imaging independently viewed and interpreted by me and I  agree with radiologist's interpretation.  Result remarkable for CXR showing bilateral pleural effusion -This patient presents to the ED for concern of lighthead, this involves an extensive number of treatment options, and is a complaint that carries with it a high risk of complications and morbidity.  The differential diagnosis includes orthostatic hypotension, anemia, dehydration, acs, pe, dissection, cardiac arrhythmia, stroke, chf exacerbation -Co morbidities that complicate the patient evaluation includes CHF, HTN, obesity, anemia -Treatment includes rocephin, blood transfusion -Reevaluation of the patient  after these medicines showed that the patient improved -PCP office notes or outside notes reviewed -Discussion with specialist hematologist and hospitalist -Escalation to admission/observation considered: patient is amenable for hospital admission and blood transfusion.   8:41 AM This patient has a drop in her hemoglobin, now at 8.4.  Reach out to hematology and spoke with Dr. Mosetta Putt in regards to blood transfusion.  She felt transfusion can be done as needed.  Patient also has an elevated troponin of 30.  EKG with sinus rhythm and PACs versus atrial fibrillation.  Suspect the rise in troponin is likely demand ischemia.  I will reach out to cardiology for recommendation.  Spoke with cardiology in regards to elevated troponin, the request medicine admission and further workup to be consulted as needed.  9:46 AM Appreciate consultation from [facility] hospitalist, Dr. Erenest Blank, who agrees to see patient and admit for further managements of her condition.        Final Clinical Impression(s) / ED Diagnoses Final diagnoses:  Symptomatic anemia  Near syncope  Elevated troponin  Lower urinary tract infectious disease    Rx / DC Orders ED Discharge Orders     None         Fayrene Helper, PA-C 11/15/22 1610    Gwyneth Sprout, MD 11/17/22 1714

## 2022-11-12 NOTE — ED Triage Notes (Signed)
C/o dizziness, weakness, sob, and "funny feeling to chest but no pain" upon standing this am.  Pt reports blood transfusions once a month due to anemia. Last blood transfusion 10/09/22 Denies rectal bleeding Patient is on eliquis.

## 2022-11-12 NOTE — ED Notes (Addendum)
ED TO INPATIENT HANDOFF REPORT  ED Nurse Name and Phone #: Crecencio Kwiatek  S Name/Age/Gender Chelsea Keller 88 y.o. female Room/Bed: WA13/WA13  Code Status   Code Status: Full Code  Home/SNF/Other Home Patient oriented to: self, place, time, and situation Is this baseline? Yes   Triage Complete: Triage complete  Chief Complaint UTI (urinary tract infection) [N39.0] Chest pain at rest [R07.9]  Triage Note C/o dizziness, weakness, sob, and "funny feeling to chest but no pain" upon standing this am.  Pt reports blood transfusions once a month due to anemia. Last blood transfusion 10/09/22 Denies rectal bleeding Patient is on eliquis.    Allergies Allergies  Allergen Reactions   Codeine Sulfate Shortness Of Breath   Celecoxib Other (See Comments)    Caused vaginal bleeding   Erythromycin Base Nausea And Vomiting   Spironolactone Nausea Only and Other (See Comments)    Blurred vision, Upset stomach, lethargy    Level of Care/Admitting Diagnosis ED Disposition     ED Disposition  Admit   Condition  --   Comment  Hospital Area: [facility] [[id number]]  Level of Care: Telemetry [5]  Admit to tele based on following criteria: Monitor for Ischemic changes  May admit patient to [facility] or [facility] if equivalent level of care is available:: Yes  Covid Evaluation: Asymptomatic - no recent exposure (last 10 days) testing not required  Diagnosis: Chest pain at rest [341128]  Admitting Physician: Maryln Gottron [[id number]]  Attending Physician: Kirby Crigler, Parks Neptune [[id number]]  Certification:: I certify this patient will need inpatient services for at least 2 midnights          B Medical/Surgery History Past Medical History:  Diagnosis Date   Allergy    Anemia    Anxiety    Arthritis    Arthritis of sacroiliac joint of both sides 11/12/2017   Bilateral pulmonary embolism (HCC) 06/23/2017   Chronic diastolic CHF (congestive heart failure) (HCC)    a.  Echo 1/16:  mild LVH, EF normal, grade 1 DD, MAC   Chronic venous insufficiency    chronic LE edema   DDD (degenerative disc disease), lumbar 11/12/2017   Degenerative joint disease (DJD) of hip, Bilateral  11/12/2017   Depression    Fibromyalgia    constant pain   Hx of cardiac catheterization    a. LHC in [place] "ok" per patient with mild plaque in a single vessel - records not available   Hx of cardiovascular stress test    a. Nuclear study in 2008 normal   Hx of colonic polyps    Hypertension    Hypertriglyceridemia    Impaired fasting glucose    PONV (postoperative nausea and vomiting)    PUD (peptic ulcer disease)    hx of gastric ulcer   Pulmonary emboli (HCC) 06/2017   Vitamin B12 deficiency    Past Surgical History:  Procedure Laterality Date   ABDOMINAL HYSTERECTOMY     CATARACT EXTRACTION  10/2003   OD   CHOLECYSTECTOMY     COLONOSCOPY W/ POLYPECTOMY     COLONOSCOPY WITH PROPOFOL N/A 10/15/2014   Procedure: COLONOSCOPY WITH PROPOFOL;  Surgeon: Meryl Dare, MD;  Location: [facility] ENDOSCOPY;  Service: Endoscopy;  Laterality: N/A;   COLONOSCOPY WITH PROPOFOL N/A 03/23/2018   Procedure: COLONOSCOPY WITH PROPOFOL;  Surgeon: Iva Boop, MD;  Location: [facility] ENDOSCOPY;  Service: Endoscopy;  Laterality: N/A;   ESOPHAGOGASTRODUODENOSCOPY (EGD) WITH PROPOFOL N/A 10/15/2014   Procedure: ESOPHAGOGASTRODUODENOSCOPY (EGD) WITH  PROPOFOL;  Surgeon: Meryl Dare, MD;  Location: [facility];  Service: Endoscopy;  Laterality: N/A;   EXTERNAL FIXATION ANKLE FRACTURE     Fx.  left ankle-fixation with pins later removed sec to infection 1985   EXTERNAL FIXATION WRIST FRACTURE  1985   left with pins   EYE SURGERY     FEMUR FRACTURE SURGERY  06/2009   FRACTURE SURGERY     HARDWARE REMOVAL Left 10/05/2013   Procedure: HARDWARE REMOVAL LEFT DISTAL FEMUR;  Surgeon: Budd Palmer, MD;  Location: [facility] OR;  Service: Orthopedics;  Laterality: Left;   HEMIARTHROPLASTY SHOULDER FRACTURE  06/2009    HOT HEMOSTASIS N/A 03/23/2018   Procedure: HOT HEMOSTASIS (ARGON PLASMA COAGULATION/BICAP);  Surgeon: Iva Boop, MD;  Location: [facility] ENDOSCOPY;  Service: Endoscopy;  Laterality: N/A;   JOINT REPLACEMENT     STERIOD INJECTION Right 10/05/2013   Procedure: STEROID INJECTION;  Surgeon: Budd Palmer, MD;  Location: [facility] OR;  Service: Orthopedics;  Laterality: Right;   TONSILLECTOMY     TONSILLECTOMY     TOTAL KNEE ARTHROPLASTY  03/09   left     A IV Location/Drains/Wounds Patient Lines/Drains/Airways Status     Active Line/Drains/Airways     Name Placement date Placement time Site Days   Peripheral IV 11/12/22 20 G 1" Left Antecubital 11/12/22  0706  Antecubital  less than 1   Wound / Incision (Open or Dehisced) 06/18/19 Leg Right;Distal 06/18/19  1858  Leg  1243            Intake/Output Last 24 hours No intake or output data in the 24 hours ending 11/12/22 1300  Labs/Imaging Results for orders placed or performed during the hospital encounter of 11/12/22 (from the past 48 hour(s))  Basic metabolic panel     Status: Abnormal   Collection Time: 11/12/22  7:05 AM  Result Value Ref Range   Sodium 135 135 - 145 mmol/L   Potassium 3.6 3.5 - 5.1 mmol/L   Chloride 104 98 - 111 mmol/L   CO2 22 22 - 32 mmol/L   Glucose, Bld 124 (H) 70 - 99 mg/dL    Comment: Glucose reference range applies only to samples taken after fasting for at least 8 hours.   BUN 20 8 - 23 mg/dL   Creatinine, Ser 1.61 (H) 0.44 - 1.00 mg/dL   Calcium 8.5 (L) 8.9 - 10.3 mg/dL   GFR, Estimated 48 (L) >60 mL/min    Comment: (NOTE) Calculated using the CKD-EPI Creatinine Equation (2021)    Anion gap 9 5 - 15    Comment: Performed at [facility], [address] [place], [place] [address]  CBC     Status: Abnormal   Collection Time: 11/12/22  7:05 AM  Result Value Ref Range   WBC 6.5 4.0 - 10.5 K/uL   RBC 3.49 (L) 3.87 - 5.11 MIL/uL   Hemoglobin 8.4 (L) 12.0 - 15.0 g/dL   HCT 54.0 (L)  98.1 - 46.0 %   MCV 84.0 80.0 - 100.0 fL   MCH 24.1 (L) 26.0 - 34.0 pg   MCHC 28.7 (L) 30.0 - 36.0 g/dL   RDW 19.1 (H) 47.8 - 29.5 %   Platelets 262 150 - 400 K/uL   nRBC 0.0 0.0 - 0.2 %    Comment: Performed at [facility], [address] [place], [place] [address]  Troponin I (High Sensitivity)     Status: Abnormal   Collection Time: 11/12/22  7:05 AM  Result Value Ref Range   Troponin I (High Sensitivity) 30 (H) <18 ng/L    Comment: (NOTE) Elevated high sensitivity troponin I (hsTnI) values and significant  changes across serial measurements may suggest ACS but many other  chronic and acute conditions are known to elevate hsTnI results.  Refer to the "Links" section for chest pain algorithms and additional  guidance. Performed at [facility], [address] [place], [place] [address]   Brain natriuretic peptide     Status: Abnormal   Collection Time: 11/12/22  7:05 AM  Result Value Ref Range   B Natriuretic Peptide 121.3 (H) 0.0 - 100.0 pg/mL    Comment: Performed at [facility], [address], [place], [place] [address]  Urinalysis, Routine w reflex microscopic -Urine, Clean Catch     Status: Abnormal   Collection Time: 11/12/22  7:15 AM  Result Value Ref Range   Color, Urine YELLOW YELLOW   APPearance CLOUDY (A) CLEAR   Specific Gravity, Urine 1.019 1.005 - 1.030   pH 5.0 5.0 - 8.0   Glucose, UA NEGATIVE NEGATIVE mg/dL   Hgb urine dipstick NEGATIVE NEGATIVE   Bilirubin Urine NEGATIVE NEGATIVE   Ketones, ur NEGATIVE NEGATIVE mg/dL   Protein, ur NEGATIVE NEGATIVE mg/dL   Nitrite POSITIVE (A) NEGATIVE   Leukocytes,Ua MODERATE (A) NEGATIVE   RBC / HPF 0-5 0 - 5 RBC/hpf   WBC, UA >50 0 - 5 WBC/hpf   Bacteria, UA MANY (A) NONE SEEN   Squamous Epithelial / HPF 11-20 0 - 5 /HPF   Mucus PRESENT    Hyaline Casts, UA PRESENT     Comment: Performed at [facility], [address]  [place], [place] [address]  CBG monitoring, ED     Status: Abnormal   Collection Time: 11/12/22  7:23 AM  Result Value Ref Range   Glucose-Capillary 120 (H) 70 - 99 mg/dL    Comment: Glucose reference range applies only to samples taken after fasting for at least 8 hours.  Prepare RBC (crossmatch)     Status: None   Collection Time: 11/12/22  8:32 AM  Result Value Ref Range   Order Confirmation      ORDER PROCESSED BY BLOOD BANK Performed at [facility], [address], [place], [place] [address]   Troponin I (High Sensitivity)     Status: None   Collection Time: 11/12/22  8:59 AM  Result Value Ref Range   Troponin I (High Sensitivity) 9 <18 ng/L    Comment: (NOTE) Elevated high sensitivity troponin I (hsTnI) values and significant  changes across serial measurements may suggest ACS but many other  chronic and acute conditions are known to elevate hsTnI results.  Refer to the "Links" section for chest pain algorithms and additional  guidance. Performed at [facility], [address] [place], [place] [address]   Type and screen [facility]     Status: None (Preliminary result)   Collection Time: 11/12/22  9:02 AM  Result Value Ref Range   ABO/RH(D) B POS    Antibody Screen NEG    Sample Expiration 11/15/2022,2359    Unit Number [id number]    Blood Component Type RED CELLS,LR    Unit division 00    Status of Unit ISSUED    Transfusion Status OK TO TRANSFUSE    Crossmatch Result      Compatible Performed at [facility], [address] [place], [place] [address]    DG Chest 2 View  Result Date: 11/12/2022 CLINICAL DATA:  Chest pain EXAM: CHEST - 2 VIEW COMPARISON:  CXR 04/25/22 FINDINGS: Trace bilateral pleural effusions. Cardiomegaly. No pneumothorax. No focal airspace opacity. No radiographically apparent displaced rib fractures. Visualized upper abdomen is unremarkable. There are severe  degenerative changes of the right glenohumeral joint with erosive change of the right humerus, this is unchanged compared to 10/16/2020. IMPRESSION: Borderline cardiomegaly with trace bilateral pleural effusions. Electronically Signed   By: Lorenza Cambridge M.D.   On: 11/12/2022 10:19    Pending Labs Unresulted Labs (From admission, onward)     Start     Ordered   11/13/22 0500  Basic metabolic panel  Tomorrow morning,   R        11/12/22 1048   11/13/22 0500  CBC  Tomorrow morning,   R        11/12/22 1048            Vitals/Pain Today's Vitals   11/12/22 1000 11/12/22 1106 11/12/22 1115 11/12/22 1145  BP: (!) 149/94  137/64 (!) 133/56  Pulse: 65  78 72  Resp: 13  16 (!) 25  Temp:  97.6 F (36.4 C) 98.9 F (37.2 C) 97.6 F (36.4 C)  TempSrc:  Oral Oral Oral  SpO2: 94%  100% 98%  Weight:      PainSc:        Isolation Precautions No active isolations  Medications Medications  apixaban (ELIQUIS) tablet 2.5 mg (has no administration in time range)  acetaminophen (TYLENOL) tablet 650 mg (has no administration in time range)    Or  acetaminophen (TYLENOL) suppository 650 mg (has no administration in time range)  traZODone (DESYREL) tablet 25 mg (has no administration in time range)  docusate sodium (COLACE) capsule 100 mg (has no administration in time range)  polyethylene glycol (MIRALAX / GLYCOLAX) packet 17 g (has no administration in time range)  ondansetron (ZOFRAN) tablet 4 mg (has no administration in time range)    Or  ondansetron (ZOFRAN) injection 4 mg (has no administration in time range)  albuterol (PROVENTIL) (2.5 MG/3ML) 0.083% nebulizer solution 2.5 mg (has no administration in time range)  hydrALAZINE (APRESOLINE) injection 5 mg (has no administration in time range)  cefTRIAXone (ROCEPHIN) 1 g in sodium chloride 0.9 % 100 mL IVPB (0 g Intravenous Stopped 11/12/22 0950)  0.9 %  sodium chloride infusion (Manually program via Guardrails IV Fluids) ( Intravenous  New Bag/Given 11/12/22 1130)    Mobility Walks with assistive device     Focused Assessments    R Recommendations: See Admitting Provider Note  Report given to:   Additional Notes:

## 2022-11-13 ENCOUNTER — Inpatient Hospital Stay: Payer: Medicare Other | Admitting: Hematology and Oncology

## 2022-11-13 ENCOUNTER — Inpatient Hospital Stay (HOSPITAL_COMMUNITY): Payer: Medicare Other

## 2022-11-13 ENCOUNTER — Inpatient Hospital Stay: Payer: Medicare Other | Attending: Hematology and Oncology

## 2022-11-13 DIAGNOSIS — R531 Weakness: Secondary | ICD-10-CM | POA: Insufficient documentation

## 2022-11-13 DIAGNOSIS — K648 Other hemorrhoids: Secondary | ICD-10-CM | POA: Insufficient documentation

## 2022-11-13 DIAGNOSIS — R4182 Altered mental status, unspecified: Secondary | ICD-10-CM | POA: Diagnosis not present

## 2022-11-13 DIAGNOSIS — N3000 Acute cystitis without hematuria: Secondary | ICD-10-CM

## 2022-11-13 DIAGNOSIS — R42 Dizziness and giddiness: Secondary | ICD-10-CM | POA: Insufficient documentation

## 2022-11-13 DIAGNOSIS — D500 Iron deficiency anemia secondary to blood loss (chronic): Secondary | ICD-10-CM | POA: Insufficient documentation

## 2022-11-13 DIAGNOSIS — R197 Diarrhea, unspecified: Secondary | ICD-10-CM | POA: Insufficient documentation

## 2022-11-13 DIAGNOSIS — Z79899 Other long term (current) drug therapy: Secondary | ICD-10-CM | POA: Insufficient documentation

## 2022-11-13 DIAGNOSIS — R5383 Other fatigue: Secondary | ICD-10-CM | POA: Insufficient documentation

## 2022-11-13 DIAGNOSIS — N390 Urinary tract infection, site not specified: Secondary | ICD-10-CM | POA: Diagnosis not present

## 2022-11-13 LAB — TYPE AND SCREEN: Antibody Screen: NEGATIVE

## 2022-11-13 LAB — BASIC METABOLIC PANEL
Anion gap: 7 (ref 5–15)
BUN: 19 mg/dL (ref 8–23)
CO2: 23 mmol/L (ref 22–32)
Calcium: 8.5 mg/dL — ABNORMAL LOW (ref 8.9–10.3)
Chloride: 107 mmol/L (ref 98–111)
Creatinine, Ser: 1.02 mg/dL — ABNORMAL HIGH (ref 0.44–1.00)
GFR, Estimated: 54 mL/min — ABNORMAL LOW (ref >60)
Glucose, Bld: 107 mg/dL — ABNORMAL HIGH (ref 70–99)
Potassium: 3.5 mmol/L (ref 3.5–5.1)
Sodium: 137 mmol/L (ref 135–145)

## 2022-11-13 LAB — CBC
HCT: 30.3 % — ABNORMAL LOW (ref 36.0–46.0)
Hemoglobin: 8.9 g/dL — ABNORMAL LOW (ref 12.0–15.0)
MCH: 24.1 pg — ABNORMAL LOW (ref 26.0–34.0)
MCHC: 29.4 g/dL — ABNORMAL LOW (ref 30.0–36.0)
MCV: 81.9 fL (ref 80.0–100.0)
Platelets: 268 10*3/uL (ref 150–400)
RBC: 3.7 MIL/uL — ABNORMAL LOW (ref 3.87–5.11)
RDW: 19.4 % — ABNORMAL HIGH (ref 11.5–15.5)
WBC: 6.8 10*3/uL (ref 4.0–10.5)
nRBC: 0 % (ref 0.0–0.2)

## 2022-11-13 LAB — BPAM RBC
Blood Product Expiration Date: 202405132359
ISSUE DATE / TIME: 202405091118
Unit Type and Rh: 7300

## 2022-11-13 MED ORDER — CEFADROXIL 500 MG PO CAPS: 500.0000 mg | ORAL_CAPSULE | Freq: Two times a day (BID) | ORAL | 0 refills | Status: AC

## 2022-11-13 MED ORDER — HYDROXYZINE HCL 10 MG PO TABS
10.0000 mg | ORAL_TABLET | Freq: Four times a day (QID) | ORAL | Status: AC | PRN
  Administered 2022-11-13: 10 mg via ORAL
  Filled 2022-11-13: qty 1

## 2022-11-13 MED ORDER — HYDROXYZINE HCL 10 MG PO TABS
10.0000 mg | ORAL_TABLET | ORAL | Status: AC | PRN
  Administered 2022-11-13: 10 mg via ORAL
  Filled 2022-11-13: qty 1

## 2022-11-13 NOTE — Evaluation (Signed)
Physical Therapy Evaluation Patient Details Name: Chelsea Keller MRN: [id number] DOB: February 11, 1935 Today's Date: 11/13/2022  History of Present Illness  88 yo female presented to ED on 11/12/2022 due to dizziness and a "funny feeling in her chest." Pt was found to have UTI, elevated Bp and low HgB of 8.4. PT required transfusion on 5/9 320 mL. CXR revealed Borderline cardiomegaly with trace bilateral pleural effusions and results of brain MRI are pending. Pt PMH includes but is not limited to: chronic anemia requiring transfusions and last 10/09/2022, arthritis, DJD, DDD of lumbar spine, hx of B PE, dCHF, fibromyalgia, HTN, HDL, multiple fxs and subsequent sx including L TKA.  Clinical Impression    Pt admitted with above diagnosis.  Pt currently with functional limitations due to the deficits listed below (see PT Problem List). Pt in bed when PT  arrived. Pt stated she is out of sorts today. Pt required encouragement to participate with therapy. Pt required increased time, HOB elevated and use of bed rails for supine to sit, pt required min A for B LE for sit to supine and reports sleeps in recliner at home.  Pt required min guard for bed and commode transfer, pt required A for lower body hygiene and indicated her daughter assist with hygiene at baseline. Pt amb short distances in room, excessive flexed posture, shuffling pattern with decreased cadence 18 and 15 feet. Pt elected to return to bed, all needs in place. Pt sated that she is to d/c home with daughter today and does not want to do therapy in the home. Pt reported fatigue with functional mobility tasks, noted B LE edema R > L and small area of rubor on dorsal surface of R foot with pt indicating pain R foot and back pain.  Pt will benefit from acute skilled PT to increase their independence and safety with mobility to allow discharge.        Recommendations for follow up therapy are one component of a multi-disciplinary discharge planning process, led  by the attending physician.  Recommendations may be updated based on patient status, additional functional criteria and insurance authorization.  Follow Up Recommendations       Assistance Recommended at Discharge Intermittent Supervision/Assistance  Patient can return home with the following  A little help with walking and/or transfers;A little help with bathing/dressing/bathroom;Assistance with cooking/housework;Assist for transportation;Help with stairs or ramp for entrance    Equipment Recommendations None recommended by PT (pt reports DME in home setting)  Recommendations for Other Services       Functional Status Assessment Patient has had a recent decline in their functional status and demonstrates the ability to make significant improvements in function in a reasonable and predictable amount of time.     Precautions / Restrictions Precautions Precautions: Fall Restrictions Weight Bearing Restrictions: No      Mobility  Bed Mobility Overal bed mobility: Needs Assistance Bed Mobility: Supine to Sit, Sit to Supine     Supine to sit: Supervision Sit to supine: Min assist   General bed mobility comments: pt reuiqred increased time, use of bed rails and HOB elevated    Transfers Overall transfer level: Needs assistance Equipment used: Rolling walker (2 wheels) Transfers: Sit to/from Stand Sit to Stand: Min guard           General transfer comment: cues and increased time    Ambulation/Gait Ambulation/Gait assistance: Min guard Gait Distance (Feet): 18 Feet Assistive device: Rolling walker (2 wheels) Gait Pattern/deviations: Step-to pattern, Shuffle, Trunk  flexed Gait velocity: decreased     General Gait Details: picking RW up and cues for extension posture, pt resting L forearm on RW with gait cues for safety and maintaining B UE support on RW vs reaching for foot of bed and sink with mobility tasks  Stairs            Wheelchair Mobility     Modified Rankin (Stroke Patients Only)       Balance Overall balance assessment: Needs assistance Sitting-balance support: Feet supported Sitting balance-Leahy Scale: Fair     Standing balance support: Bilateral upper extremity supported, Reliant on assistive device for balance (static standing) Standing balance-Leahy Scale: Fair                               Pertinent Vitals/Pain Pain Assessment Pain Assessment: 0-10 Pain Score: 4  Pain Location: back, R foot, pt reports she thinks she dropped something on her foot and it has been red ever since. PT inquired if pt had communicated with other hosptial staff per pain and redness on foot  pt stated she has made staff aware Pain Descriptors / Indicators: Constant Pain Intervention(s): Limited activity within patient's tolerance, Monitored during session    Home Living Family/patient expects to be discharged to:: Private residence Living Arrangements: Children;Other relatives Available Help at Discharge: Family Type of Home: House Home Access: Stairs to enter (one small step) Entrance Stairs-Rails: None Entrance Stairs-Number of Steps: 1 Alternate Level Stairs-Number of Steps: stair lift Home Layout: Two level Home Equipment: Rollator (4 wheels);Cane - single point;Tub bench;Shower seat;BSC/3in1 Additional Comments: sleeps in recliner    Prior Function Prior Level of Function : Independent/Modified Independent             Mobility Comments: mod I with rollator level for all ADLs, self care tasks with the exception of lower body hygiene following voiding bowels. family assists with meals and IADLs       Hand Dominance        Extremity/Trunk Assessment   Upper Extremity Assessment Upper Extremity Assessment: Defer to OT evaluation (ROM deficits noted)    Lower Extremity Assessment Lower Extremity Assessment: Generalized weakness    Cervical / Trunk Assessment Cervical / Trunk Assessment:  Kyphotic (trunk flexion)  Communication   Communication: No difficulties  Cognition Arousal/Alertness: Awake/alert Behavior During Therapy: WFL for tasks assessed/performed Overall Cognitive Status: Within Functional Limits for tasks assessed                                          General Comments      Exercises     Assessment/Plan    PT Assessment Patient needs continued PT services  PT Problem List Decreased strength;Decreased range of motion;Decreased activity tolerance;Decreased balance;Decreased mobility;Decreased coordination;Pain       PT Treatment Interventions DME instruction;Gait training;Stair training;Functional mobility training;Therapeutic activities;Therapeutic exercise;Balance training;Neuromuscular re-education;Modalities;Patient/family education    PT Goals (Current goals can be found in the Care Plan section)  Acute Rehab PT Goals Patient Stated Goal: to go home PT Goal Formulation: With patient Time For Goal Achievement: 11/27/22 Potential to Achieve Goals: Fair    Frequency Min 1X/week     Co-evaluation               AM-PAC PT "6 Clicks" Mobility  Outcome Measure Help needed turning from your  back to your side while in a flat bed without using bedrails?: A Little Help needed moving from lying on your back to sitting on the side of a flat bed without using bedrails?: A Little Help needed moving to and from a bed to a chair (including a wheelchair)?: A Little Help needed standing up from a chair using your arms (e.g., wheelchair or bedside chair)?: A Little Help needed to walk in hospital room?: A Little Help needed climbing 3-5 steps with a railing? : A Lot 6 Click Score: 17    End of Session Equipment Utilized During Treatment: Gait belt Activity Tolerance: Patient limited by fatigue Patient left: in bed;with call bell/phone within reach Nurse Communication: Mobility status PT Visit Diagnosis: Unsteadiness on feet  (R26.81);Other abnormalities of gait and mobility (R26.89);Muscle weakness (generalized) (M62.81);Pain Pain - Right/Left: Right Pain - part of body: Ankle and joints of foot (back)    Time: 1610-9604 PT Time Calculation (min) (ACUTE ONLY): 42 min   Charges:   PT Evaluation $PT Eval Low Complexity: 1 Low PT Treatments $Gait Training: 8-22 mins $Therapeutic Activity: 8-22 mins        Rica Mote, PT   Jacqualyn Posey 11/13/2022, 12:34 PM

## 2022-11-13 NOTE — Care Management CC44 (Signed)
Condition Code 44 Documentation Completed  Patient Details  Name: Chelsea Keller MRN: [id number] Date of Birth: 1935/05/22   Condition Code 44 given:  Yes Patient signature on Condition Code 44 notice:  Yes Documentation of 2 MD's agreement:  Yes Code 44 added to claim:  Yes    Otelia Santee, LCSW 11/13/2022, 9:41 AM

## 2022-11-13 NOTE — TOC Transition Note (Signed)
Transition of Care [facility]) - CM/SW Discharge Note   Patient Details  Name: Chelsea Keller MRN: [id number] Date of Birth: [birth year]  Transition of Care [facility]) CM/SW Contact:  Otelia Santee, LCSW Phone Number: 11/13/2022, 12:34 PM   Clinical Narrative:    Met with pt to discuss recommendation for HHPT. Pt adamantly declines HH services. She states it is "too much of a hassle to put on clothes, get ready, and come down stairs."  CSW reviewed need for PT. Pt continued to decline services.    Final next level of care: Home/Self Care Barriers to Discharge: No Barriers Identified   Patient Goals and CMS Choice CMS Medicare.gov Compare Post Acute Care list provided to:: Patient Choice offered to / list presented to : Patient  Discharge Placement                         Discharge Plan and Services Additional resources added to the After Visit Summary for                  DME Arranged: N/A DME Agency: NA                  Social Determinants of Health (SDOH) Interventions SDOH Screenings   Food Insecurity: No Food Insecurity (11/12/2022)  Housing: Low Risk  (11/12/2022)  Transportation Needs: No Transportation Needs (11/12/2022)  Utilities: Not At Risk (11/12/2022)  Depression (PHQ2-9): Low Risk  (07/11/2020)  Tobacco Use: Medium Risk (11/12/2022)     Readmission Risk Interventions    11/13/2022   12:33 PM  Readmission Risk Prevention Plan  Post Dischage Appt Complete  Medication Screening Complete  Transportation Screening Complete

## 2022-11-13 NOTE — Discharge Summary (Signed)
Physician Discharge Summary  Chelsea Keller OZH:[id number] DOB: February 02, 1935 DOA: 11/12/2022  PCP: [name], MD  Admit date: 11/12/2022 Discharge date: 11/13/2022  Admitted From: home Disposition:  home  Recommendations for Outpatient Follow-up:  Follow up with PCP in 1-2 weeks  Home Health: PT (refused) Equipment/Devices: none  Discharge Condition: stable CODE STATUS: Full code Diet Orders (From admission, onward)     Start     Ordered   11/12/22 1048  Diet regular Room service appropriate? Yes; Fluid consistency: Thin  Diet effective now       Question Answer Comment  Room service appropriate? Yes   Fluid consistency: Thin      11/12/22 1048            HPI: Per admitting MD, Rickesha Whitter is a 88 y.o. female with medical history significant for essential hypertension, chronic diastolic congestive heart failure, morbid obesity, chronic blood loss anemia requiring every 6 with blood transfusions being admitted to the hospital with dizziness, and chest discomfort.  Patient states she last got a blood transfusion with her hematologist on 10/09/2022, has been doing well.  This morning, she was sitting in her chair bent down to pick up something off the floor and got quite dizzy, after this she waited a few minutes and tried to stand up and got quite dizzy again.  During that time, she also had a "funny feeling in her chest "which has since resolved.   Hospital Course / Discharge diagnoses: Principal Problem:   UTI (urinary tract infection) Active Problems:   Morbid obesity (HCC)   Chronic diastolic CHF (congestive heart failure) (HCC)   Hypertension   Anemia of chronic disease   Chest pain   Chest pain at rest  Principal problem Symptomatic anemia, anemia of chronic disease, dizziness-patient was admitted to the hospital with dizziness and weakness.  Felt initially to be due to symptomatic anemia, and she was transfused unit of packed red blood cells.  She regularly  receives blood transfusions as an outpatient in the oncology centers for her chronic anemia.  Her symptoms however were slightly different, and therefore she underwent an MRI of the brain which did not show any evidence of acute intracranial processes.  She has returned to baseline, able to work with physical therapy, can be discharged home in stable condition  Active problems Chest discomfort -transient at home, resolved.  Troponins unremarkable, less likely to have a PE given that she is on Eliquis already. Chronic kidney disease stage IIIa -creatinine at baseline History of recurrent DVTs -continue home Eliquis UTI -she had symptoms with increased frequency, suprapubic tenderness.  Received ceftriaxone in the ED, will change to cefadroxil for 2 additional days to complete a 3-day course Essential hypertension-resume home medications on discharge Chronic diastolic CHF-euvolemic, resume home medications Obesity, morbid-BMI 46, she would benefit from weight loss  Sepsis ruled out   Discharge Instructions   Allergies as of 11/13/2022       Reactions   Codeine Sulfate Shortness Of Breath   Celecoxib Other (See Comments)   Caused vaginal bleeding   Erythromycin Base Nausea And Vomiting   Spironolactone Nausea Only, Other (See Comments)   Blurred vision, Upset stomach, lethargy        Medication List     STOP taking these medications    Eucrisa 2 % Oint Generic drug: Crisaborole   fexofenadine 60 MG tablet Commonly known as: ALLEGRA       TAKE these medications    acetaminophen  500 MG tablet Commonly known as: TYLENOL Take 1,500 mg by mouth every 12 (twelve) hours as needed (for pain- with each dose of Tramadol).   allopurinol 100 MG tablet Commonly known as: ZYLOPRIM Take 1 tablet (100 mg total) by mouth daily. What changed: when to take this   allopurinol 300 MG tablet Commonly known as: ZYLOPRIM Take 1 tablet (300 mg total) by mouth daily. What changed: when to  take this   apixaban 2.5 MG Tabs tablet Commonly known as: Eliquis Take 1 tablet (2.5 mg total) by mouth 2 (two) times daily.   cefadroxil 500 MG capsule Commonly known as: DURICEF Take 1 capsule (500 mg total) by mouth 2 (two) times daily for 2 days.   colchicine 0.6 MG tablet Take 1 tablet (0.6 mg total) by mouth 2 (two) times daily. What changed: when to take this   gabapentin 300 MG capsule Commonly known as: NEURONTIN TAKE 1 CAPSULE IN THE MORNING AND 2 CAPSULES AT BEDTIME What changed: See the new instructions.   hydrOXYzine 10 MG tablet Commonly known as: ATARAX TAKE 1 TABLET BY MOUTH THREE TIMES A DAY AS NEEDED What changed: reasons to take this   Klor-Con M20 20 MEQ tablet Generic drug: potassium chloride SA TAKE 2 TABLETS (40 MEQ TOTAL) DAILY What changed: See the new instructions.   ondansetron 4 MG tablet Commonly known as: ZOFRAN Take 1 tablet (4 mg total) by mouth every 8 (eight) hours as needed for nausea or vomiting.   torsemide 20 MG tablet Commonly known as: DEMADEX Take 20 mg by mouth daily as needed (for fluid).   traMADol 50 MG tablet Commonly known as: ULTRAM Take 1 tablet (50 mg total) by mouth 3 (three) times daily as needed for moderate pain.   triamterene-hydrochlorothiazide 37.5-25 MG tablet Commonly known as: MAXZIDE-25 TAKE 1 TABLET DAILY       Consultations: none  Procedures/Studies:  MR BRAIN WO CONTRAST  Result Date: 11/13/2022 CLINICAL DATA:  Mental status change, unknown cause. EXAM: MRI HEAD WITHOUT CONTRAST TECHNIQUE: Multiplanar, multiecho pulse sequences of the brain and surrounding structures were obtained without intravenous contrast. COMPARISON:  Head CT 07/17/2016. FINDINGS: Brain: No acute infarct or hemorrhage. No mass or midline shift. No hydrocephalus or extra-axial collection. No abnormal susceptibility. Vascular: Normal flow voids. Skull and upper cervical spine: Normal marrow signal. Sinuses/Orbits: Unremarkable.  Other: None. IMPRESSION: No acute intracranial process. Electronically Signed   By: Orvan Falconer M.D.   On: 11/13/2022 10:59   DG Chest 2 View  Result Date: 11/12/2022 CLINICAL DATA:  Chest pain EXAM: CHEST - 2 VIEW COMPARISON:  CXR 04/25/22 FINDINGS: Trace bilateral pleural effusions. Cardiomegaly. No pneumothorax. No focal airspace opacity. No radiographically apparent displaced rib fractures. Visualized upper abdomen is unremarkable. There are severe degenerative changes of the right glenohumeral joint with erosive change of the right humerus, this is unchanged compared to 10/16/2020. IMPRESSION: Borderline cardiomegaly with trace bilateral pleural effusions. Electronically Signed   By: Lorenza Cambridge M.D.   On: 11/12/2022 10:19    Subjective: - no chest pain, shortness of breath, no abdominal pain, nausea or vomiting.   Discharge Exam: BP (!) 150/51 (BP Location: Left Arm)   Pulse 69   Temp 98.3 F (36.8 C)   Resp 18   Ht 5\' 3"  (1.6 m)   Wt 114 kg   SpO2 95%   BMI 44.52 kg/m   General: Pt is alert, awake, not in acute distress Cardiovascular: RRR, S1/S2 +, no rubs, no gallops  Respiratory: CTA bilaterally, no wheezing, no rhonchi Abdominal: Soft, NT, ND, bowel sounds + Extremities: no edema, no cyanosis  The results of significant diagnostics from this hospitalization (including imaging, microbiology, ancillary and laboratory) are listed below for reference.     Microbiology: No results found for this or any previous visit (from the past 240 hour(s)).   Labs: Basic Metabolic Panel: Recent Labs  Lab 11/12/22 0705 11/13/22 0546  NA 135 137  K 3.6 3.5  CL 104 107  CO2 22 23  GLUCOSE 124* 107*  BUN 20 19  CREATININE 1.12* 1.02*  CALCIUM 8.5* 8.5*   Liver Function Tests: No results for input(s): "AST", "ALT", "ALKPHOS", "BILITOT", "PROT", "ALBUMIN" in the last 168 hours. CBC: Recent Labs  Lab 11/12/22 0705 11/13/22 0546  WBC 6.5 6.8  HGB 8.4* 8.9*  HCT 29.3*  30.3*  MCV 84.0 81.9  PLT 262 268   CBG: Recent Labs  Lab 11/12/22 0723  GLUCAP 120*   Hgb A1c No results for input(s): "HGBA1C" in the last 72 hours. Lipid Profile No results for input(s): "CHOL", "HDL", "LDLCALC", "TRIG", "CHOLHDL", "LDLDIRECT" in the last 72 hours. Thyroid function studies No results for input(s): "TSH", "T4TOTAL", "T3FREE", "THYROIDAB" in the last 72 hours.  Invalid input(s): "FREET3" Urinalysis    Component Value Date/Time   COLORURINE YELLOW 11/12/2022 0715   APPEARANCEUR CLOUDY (A) 11/12/2022 0715   LABSPEC 1.019 11/12/2022 0715   PHURINE 5.0 11/12/2022 0715   GLUCOSEU NEGATIVE 11/12/2022 0715   HGBUR NEGATIVE 11/12/2022 0715   HGBUR negative 01/13/2010 1056   BILIRUBINUR NEGATIVE 11/12/2022 0715   BILIRUBINUR negative 02/11/2017 1709   KETONESUR NEGATIVE 11/12/2022 0715   PROTEINUR NEGATIVE 11/12/2022 0715   UROBILINOGEN 0.2 02/11/2017 1709   UROBILINOGEN 0.2 01/13/2010 1056   NITRITE POSITIVE (A) 11/12/2022 0715   LEUKOCYTESUR MODERATE (A) 11/12/2022 0715    FURTHER DISCHARGE INSTRUCTIONS:   Get Medicines reviewed and adjusted: Please take all your medications with you for your next visit with your Primary MD   Laboratory/radiological data: Please request your Primary MD to go over all hospital tests and procedure/radiological results at the follow up, please ask your Primary MD to get all Hospital records sent to his/her office.   In some cases, they will be blood work, cultures and biopsy results pending at the time of your discharge. Please request that your primary care M.D. goes through all the records of your hospital data and follows up on these results.   Also Note the following: If you experience worsening of your admission symptoms, develop shortness of breath, life threatening emergency, suicidal or homicidal thoughts you must seek medical attention immediately by calling 911 or calling your MD immediately  if symptoms less  severe.   You must read complete instructions/literature along with all the possible adverse reactions/side effects for all the Medicines you take and that have been prescribed to you. Take any new Medicines after you have completely understood and accpet all the possible adverse reactions/side effects.    Do not drive when taking Pain medications or sleeping medications (Benzodaizepines)   Do not take more than prescribed Pain, Sleep and Anxiety Medications. It is not advisable to combine anxiety,sleep and pain medications without talking with your primary care practitioner   Special Instructions: If you have smoked or chewed Tobacco  in the last 2 yrs please stop smoking, stop any regular Alcohol  and or any Recreational drug use.   Wear Seat belts while driving.   Please note:  You were cared for by a hospitalist during your hospital stay. Once you are discharged, your primary care physician will handle any further medical issues. Please note that NO REFILLS for any discharge medications will be authorized once you are discharged, as it is imperative that you return to your primary care physician (or establish a relationship with a primary care physician if you do not have one) for your post hospital discharge needs so that they can reassess your need for medications and monitor your lab values.  Time coordinating discharge: 35 minutes  SIGNED:  Pamella Pert, MD, PhD 11/13/2022, 12:27 PM

## 2022-11-13 NOTE — Care Management Obs Status (Signed)
MEDICARE OBSERVATION STATUS NOTIFICATION   Patient Details  Name: Chelsea Keller MRN: [id number] Date of Birth: 08/25/1934   Medicare Observation Status Notification Given:  Yes    Otelia Santee, LCSW 11/13/2022, 9:41 AM

## 2022-11-16 ENCOUNTER — Encounter: Payer: Self-pay | Admitting: Hematology and Oncology

## 2022-11-16 NOTE — Progress Notes (Signed)
HEMATOLOGY-ONCOLOGY TELEPHONE VISIT PROGRESS NOTE  I connected with our patient on 11/17/22 at  9:15 AM EDT by telephone and verified that I am speaking with the correct person using two identifiers.  I discussed the limitations, risks, security and privacy concerns of performing an evaluation and management service by telephone and the availability of in person appointments.  I also discussed with the patient that there may be a patient responsible charge related to this service. The patient expressed understanding and agreed to proceed.   History of Present Illness: Recent trip to the hospital with anemia and required 1 unit of PRBC.  Patient was diagnosed with UTI and was treated with antibiotics.  He felt extremely weak tired and dizzy.  Chelsea Keller is a  88 year old with above-mentioned survive deficiency anemia who cannot tolerate any form of intravenous iron therapy and requires periodic blood transfusions for severe anemia.  She is here for a telephone follow-up to review labs.   REVIEW OF SYSTEMS:   Constitutional: Denies fevers, chills or abnormal weight loss All other systems were reviewed with the patient and are negative. Observations/Objective:     Assessment Plan:  Iron deficiency anemia due to chronic blood loss Hospitalization 08/11/2018: Severe GI bleed hemoglobin 4.9 (internal hemorrhoids and angiodysplasia of the colon)Received blood transfusions IV iron treatment: September 2019, March 2020, [name] June 2020, Venofer January 2021    Toxicities of iron infusion:Patient got profound diarrhea which last for 2 to 3 days after Feraheme (and prescription for Lomotil for 3 days).  With Venofer she did okay but she still had profound diarrhea as well as lethargic   10/10/2019: Hemoglobin 6.8: MCV 82, RDW 18: 2 units of PRBC given on 10/11/2019. 11/20/2019: Hemoglobin 7.1, MCV 81.2 Iron studies ferritin 7, iron saturation 6%, absolute reticulocyte count 88.3 12/26/2020: Hemoglobin  7.9 (2 units PRBC) 08/14/2021: Hemoglobin 10.5, MCV 78.4, iron saturation 27%, ferritin 14 09/16/2021: Hemoglobin 9.8, MCV 81.3 11/17/2021: Hemoglobin 9.5, MCV 80.6, RDW 18.1 11/13/2022: Hemoglobin 8.9, MCV 81.9, RDW 19.4, platelets 268 (1 unit PRBC given)   Blood transfusions: every 4 weeks   She does not want to receive Aranesp injections.    Because of her severe intolerance with the IV iron we are unable to do so.  She is also intolerant to oral iron therapy.   Current treatment: We will set her up for blood transfusion of 2 units in 2 weeks.  It looks like she is going to require blood every 4 weeks instead of every 6 weeks.    I discussed the assessment and treatment plan with the patient. The patient was provided an opportunity to ask questions and all were answered. The patient agreed with the plan and demonstrated an understanding of the instructions. The patient was advised to call back or seek an in-person evaluation if the symptoms worsen or if the condition fails to improve as anticipated.   I provided 12 minutes of non-face-to-face time during this encounter.  This includes time for charting and coordination of care   Chelsea Meek, MD  I Chelsea Keller am acting as a scribe for Chelsea Keller  I have reviewed the above documentation for accuracy and completeness, and I agree with the above.

## 2022-11-17 ENCOUNTER — Inpatient Hospital Stay (HOSPITAL_BASED_OUTPATIENT_CLINIC_OR_DEPARTMENT_OTHER): Payer: Medicare Other | Admitting: Hematology and Oncology

## 2022-11-17 DIAGNOSIS — D500 Iron deficiency anemia secondary to blood loss (chronic): Secondary | ICD-10-CM

## 2022-11-17 NOTE — Assessment & Plan Note (Addendum)
Hospitalization 08/11/2018: Severe GI bleed hemoglobin 4.9 (internal hemorrhoids and angiodysplasia of the colon)Received blood transfusions IV iron treatment: September 2019, March 2020, [name] June 2020, Venofer January 2021    Toxicities of iron infusion:Patient got profound diarrhea which last for 2 to 3 days after Feraheme (and prescription for Lomotil for 3 days).  With Venofer she did okay but she still had profound diarrhea as well as lethargic   10/10/2019: Hemoglobin 6.8: MCV 82, RDW 18: 2 units of PRBC given on 10/11/2019. 11/20/2019: Hemoglobin 7.1, MCV 81.2 Iron studies ferritin 7, iron saturation 6%, absolute reticulocyte count 88.3 12/26/2020: Hemoglobin 7.9 (2 units PRBC) 08/14/2021: Hemoglobin 10.5, MCV 78.4, iron saturation 27%, ferritin 14 09/16/2021: Hemoglobin 9.8, MCV 81.3 11/17/2021: Hemoglobin 9.5, MCV 80.6, RDW 18.1 11/13/2022: Hemoglobin 8.9, MCV 81.9, RDW 19.4, platelets 268   Blood transfusions: every 6 weeks   She does not want to receive Aranesp injections.    Because of her severe intolerance with the IV iron we are unable to do so.  She is also intolerant to oral iron therapy.   Current treatment: Observation and blood transfusions as needed  Return to clinic every 6 weeks for labs and blood transfusions. I will see her back in 9 weeks.

## 2022-11-18 ENCOUNTER — Telehealth: Payer: Self-pay | Admitting: Hematology and Oncology

## 2022-11-18 NOTE — Telephone Encounter (Signed)
Scheduled appointments per 5/14 los. Left voicemail for the patients daughter Arline Asp.

## 2022-11-26 ENCOUNTER — Encounter: Payer: Self-pay | Admitting: Internal Medicine

## 2022-11-26 MED ORDER — TRAMADOL HCL 50 MG PO TABS: 50.0000 mg | ORAL_TABLET | Freq: Three times a day (TID) | ORAL | 0 refills | Status: DC | PRN

## 2022-11-26 NOTE — Telephone Encounter (Signed)
The colchicine should have refills. Tramadol last filled  10-21-22 #60 Last OV 04-07-22 No Future OV [facility]

## 2022-12-03 ENCOUNTER — Other Ambulatory Visit: Payer: Self-pay

## 2022-12-03 DIAGNOSIS — D500 Iron deficiency anemia secondary to blood loss (chronic): Secondary | ICD-10-CM

## 2022-12-04 ENCOUNTER — Inpatient Hospital Stay: Payer: Medicare Other

## 2022-12-04 DIAGNOSIS — R5383 Other fatigue: Secondary | ICD-10-CM | POA: Diagnosis not present

## 2022-12-04 DIAGNOSIS — D500 Iron deficiency anemia secondary to blood loss (chronic): Secondary | ICD-10-CM

## 2022-12-04 DIAGNOSIS — R42 Dizziness and giddiness: Secondary | ICD-10-CM | POA: Diagnosis not present

## 2022-12-04 DIAGNOSIS — R531 Weakness: Secondary | ICD-10-CM | POA: Diagnosis not present

## 2022-12-04 DIAGNOSIS — K648 Other hemorrhoids: Secondary | ICD-10-CM | POA: Diagnosis not present

## 2022-12-04 DIAGNOSIS — R197 Diarrhea, unspecified: Secondary | ICD-10-CM | POA: Diagnosis not present

## 2022-12-04 DIAGNOSIS — Z79899 Other long term (current) drug therapy: Secondary | ICD-10-CM | POA: Diagnosis not present

## 2022-12-04 LAB — CMP (CANCER CENTER ONLY)
ALT: 14 U/L (ref 0–44)
AST: 40 U/L (ref 15–41)
Albumin: 3.4 g/dL — ABNORMAL LOW (ref 3.5–5.0)
Alkaline Phosphatase: 145 U/L — ABNORMAL HIGH (ref 38–126)
Anion gap: 6 (ref 5–15)
BUN: 14 mg/dL (ref 8–23)
CO2: 29 mmol/L (ref 22–32)
Calcium: 9.8 mg/dL (ref 8.9–10.3)
Chloride: 106 mmol/L (ref 98–111)
Creatinine: 1.08 mg/dL — ABNORMAL HIGH (ref 0.44–1.00)
GFR, Estimated: 50 mL/min — ABNORMAL LOW (ref >60)
Glucose, Bld: 103 mg/dL — ABNORMAL HIGH (ref 70–99)
Potassium: 4 mmol/L (ref 3.5–5.1)
Sodium: 141 mmol/L (ref 135–145)
Total Bilirubin: 0.9 mg/dL (ref 0.3–1.2)
Total Protein: 5.7 g/dL — ABNORMAL LOW (ref 6.5–8.1)

## 2022-12-04 LAB — CBC WITH DIFFERENTIAL (CANCER CENTER ONLY)
Abs Immature Granulocytes: 0.02 10*3/uL (ref 0.00–0.07)
Basophils Absolute: 0.1 10*3/uL (ref 0.0–0.1)
Basophils Relative: 1 %
Eosinophils Absolute: 1.1 10*3/uL — ABNORMAL HIGH (ref 0.0–0.5)
Eosinophils Relative: 15 %
HCT: 32 % — ABNORMAL LOW (ref 36.0–46.0)
Hemoglobin: 9.5 g/dL — ABNORMAL LOW (ref 12.0–15.0)
Immature Granulocytes: 0 %
Lymphocytes Relative: 15 %
Lymphs Abs: 1.1 10*3/uL (ref 0.7–4.0)
MCH: 24.5 pg — ABNORMAL LOW (ref 26.0–34.0)
MCHC: 29.7 g/dL — ABNORMAL LOW (ref 30.0–36.0)
MCV: 82.5 fL (ref 80.0–100.0)
Monocytes Absolute: 0.7 10*3/uL (ref 0.1–1.0)
Monocytes Relative: 9 %
Neutro Abs: 4.2 10*3/uL (ref 1.7–7.7)
Neutrophils Relative %: 60 %
Platelet Count: 236 10*3/uL (ref 150–400)
RBC: 3.88 MIL/uL (ref 3.87–5.11)
RDW: 18.6 % — ABNORMAL HIGH (ref 11.5–15.5)
WBC Count: 7.2 10*3/uL (ref 4.0–10.5)
nRBC: 0 % (ref 0.0–0.2)

## 2022-12-04 LAB — IRON AND IRON BINDING CAPACITY (CC-WL,HP ONLY)
Iron: 26 ug/dL — ABNORMAL LOW (ref 28–170)
Saturation Ratios: 6 % — ABNORMAL LOW (ref 10.4–31.8)
TIBC: 414 ug/dL (ref 250–450)
UIBC: 388 ug/dL (ref 148–442)

## 2022-12-04 LAB — SAMPLE TO BLOOD BANK

## 2022-12-04 LAB — FERRITIN: Ferritin: 12 ng/mL (ref 11–307)

## 2022-12-04 NOTE — Progress Notes (Signed)
Patient came in today for blood. Her hemoglobin is 9.5 and hematocrit 32.0. Discussed with Dr. Pamelia Hoit- no blood required today. Patient aware -discussed and gave copy of labs.

## 2022-12-08 ENCOUNTER — Encounter: Payer: Self-pay | Admitting: Internal Medicine

## 2022-12-08 MED ORDER — COLCHICINE 0.6 MG PO TABS: 0.6000 mg | ORAL_TABLET | Freq: Two times a day (BID) | ORAL | 11 refills | Status: DC

## 2022-12-27 NOTE — Progress Notes (Signed)
Patient Care Team: [name], MD as PCP - General [name], MD as PCP - Cardiology (Cardiology) Kennon Rounds as Physician Assistant (Physician Assistant) Serena Croissant, MD as Consulting Physician (Hematology and Oncology)  DIAGNOSIS:  Encounter Diagnosis  Name Primary?   Iron deficiency anemia due to chronic blood loss Yes     CHIEF COMPLIANT: Follow-up anemia  INTERVAL HISTORY: Chelsea Keller is a 88 year old with above-mentioned survive deficiency anemia. She presents to the clinic for a follow-up. She reports that she feels ok. She states that her right hand hurts more. Denies any lymphedema. She has no new issues to report to the clinic today.    ALLERGIES:  is allergic to codeine sulfate, celecoxib, erythromycin base, and spironolactone.  MEDICATIONS:  Current Outpatient Medications  Medication Sig Dispense Refill   acetaminophen (TYLENOL) 500 MG tablet Take 1,500 mg by mouth every 12 (twelve) hours as needed (for pain- with each dose of Tramadol).     allopurinol (ZYLOPRIM) 100 MG tablet Take 1 tablet (100 mg total) by mouth daily. (Patient taking differently: Take 100 mg by mouth daily in the afternoon.) 90 tablet 3   allopurinol (ZYLOPRIM) 300 MG tablet Take 1 tablet (300 mg total) by mouth daily. (Patient taking differently: Take 300 mg by mouth daily in the afternoon.) 90 tablet 3   apixaban (ELIQUIS) 2.5 MG TABS tablet Take 1 tablet (2.5 mg total) by mouth 2 (two) times daily. 180 tablet 3   colchicine 0.6 MG tablet Take 1 tablet (0.6 mg total) by mouth 2 (two) times daily. 60 tablet 11   gabapentin (NEURONTIN) 300 MG capsule TAKE 1 CAPSULE IN THE MORNING AND 2 CAPSULES AT BEDTIME (Patient taking differently: Take 300-600 mg by mouth See admin instructions. Take 300 mg by mouth in the morning and 600 mg with supper) 270 capsule 3   hydrOXYzine (ATARAX) 10 MG tablet TAKE 1 TABLET BY MOUTH THREE TIMES A DAY AS NEEDED (Patient taking  differently: Take 10 mg by mouth 3 (three) times daily as needed for itching.) 270 tablet 1   KLOR-CON M20 20 MEQ tablet TAKE 2 TABLETS (40 MEQ TOTAL) DAILY (Patient taking differently: Take 40 mEq by mouth in the morning.) 180 tablet 3   ondansetron (ZOFRAN) 4 MG tablet Take 1 tablet (4 mg total) by mouth every 8 (eight) hours as needed for nausea or vomiting. 20 tablet 0   torsemide (DEMADEX) 20 MG tablet Take 20 mg by mouth daily as needed (for fluid).     traMADol (ULTRAM) 50 MG tablet Take 1 tablet (50 mg total) by mouth 3 (three) times daily as needed for moderate pain. 60 tablet 0   triamterene-hydrochlorothiazide (MAXZIDE-25) 37.5-25 MG tablet TAKE 1 TABLET DAILY 90 tablet 3   No current facility-administered medications for this visit.    PHYSICAL EXAMINATION: ECOG PERFORMANCE STATUS: 1 - Symptomatic but completely ambulatory  Vitals:   01/01/23 1024  BP: (!) 152/67  Pulse: 78  Resp: 18  Temp: (!) 97.5 F (36.4 C)  SpO2: 96%   Filed Weights   01/01/23 1024  Weight: 246 lb 14.4 oz (112 kg)      LABORATORY DATA:  I have reviewed the data as listed    Latest Ref Rng & Units 12/04/2022    8:21 AM 11/13/2022    5:46 AM 11/12/2022    7:05 AM  CMP  Glucose 70 - 99 mg/dL 161  096  045   BUN 8 - 23 mg/dL 14  19  20   Creatinine 0.44 - 1.00 mg/dL 1.61  0.96  0.45   Sodium 135 - 145 mmol/L 141  137  135   Potassium 3.5 - 5.1 mmol/L 4.0  3.5  3.6   Chloride 98 - 111 mmol/L 106  107  104   CO2 22 - 32 mmol/L 29  23  22    Calcium 8.9 - 10.3 mg/dL 9.8  8.5  8.5   Total Protein 6.5 - 8.1 g/dL 5.7     Total Bilirubin 0.3 - 1.2 mg/dL 0.9     Alkaline Phos 38 - 126 U/L 145     AST 15 - 41 U/L 40     ALT 0 - 44 U/L 14       Lab Results  Component Value Date   WBC 5.6 01/01/2023   HGB 9.1 (L) 01/01/2023   HCT 30.7 (L) 01/01/2023   MCV 82.7 01/01/2023   PLT 263 01/01/2023   NEUTROABS 3.1 01/01/2023    ASSESSMENT & PLAN:  Iron deficiency anemia due to chronic blood  loss Hospitalization 08/11/2018: Severe GI bleed hemoglobin 4.9 (internal hemorrhoids and angiodysplasia of the colon)Received blood transfusions IV iron treatment: September 2019, March 2020, [facility] June 2020, Venofer January 2021    Toxicities of iron infusion:Patient got profound diarrhea which last for 2 to 3 days after Feraheme (and prescription for Lomotil for 3 days).  With Venofer she did okay but she still had profound diarrhea as well as lethargic   10/10/2019: Hemoglobin 6.8: MCV 82, RDW 18: 2 units of PRBC given on 10/11/2019. 11/20/2019: Hemoglobin 7.1, MCV 81.2 Iron studies ferritin 7, iron saturation 6%, absolute reticulocyte count 88.3 12/26/2020: Hemoglobin 7.9 (2 units PRBC) 08/14/2021: Hemoglobin 10.5, MCV 78.4, iron saturation 27%, ferritin 14 09/16/2021: Hemoglobin 9.8, MCV 81.3 11/17/2021: Hemoglobin 9.5, MCV 80.6, RDW 18.1 11/13/2022: Hemoglobin 8.9, MCV 81.9, RDW 19.4, platelets 268 (1 unit PRBC given) 01/01/2023: Hemoglobin 9.1 (no blood)   Blood transfusions: every 4 weeks as needed   She does not want to receive Aranesp injections.    Because of her severe intolerance with the IV iron we are unable to do so.  She is also intolerant to oral iron therapy.   Current treatment: Periodic blood transfusions as needed Return to clinic in 1 month for follow-up   Orders Placed This Encounter  Procedures   CBC with Differential (Cancer Center Only)    Standing Status:   Future    Standing Expiration Date:   01/01/2024   Ferritin    Standing Status:   Future    Standing Expiration Date:   01/01/2024   Iron and Iron Binding Capacity ([facility] only)    Standing Status:   Future    Standing Expiration Date:   01/01/2024   Sample to Blood Bank    Standing Status:   Future    Standing Expiration Date:   01/01/2024   The patient has a good understanding of the overall plan. she agrees with it. she will call with any problems that may develop before the next visit here. Total time  spent: 30 mins including face to face time and time spent for planning, charting and co-ordination of care   Tamsen Meek, MD 01/01/23    I Janan Ridge am acting as a Neurosurgeon for [employer]  I have reviewed the above documentation for accuracy and completeness, and I agree with the above.

## 2023-01-01 ENCOUNTER — Other Ambulatory Visit: Payer: Self-pay

## 2023-01-01 ENCOUNTER — Inpatient Hospital Stay: Payer: Medicare Other

## 2023-01-01 ENCOUNTER — Inpatient Hospital Stay: Payer: Medicare Other | Attending: Hematology and Oncology | Admitting: Hematology and Oncology

## 2023-01-01 VITALS — BP 152/67 | HR 78 | Temp 97.5°F | Resp 18 | Ht 63.0 in | Wt 246.9 lb

## 2023-01-01 DIAGNOSIS — Z7901 Long term (current) use of anticoagulants: Secondary | ICD-10-CM | POA: Insufficient documentation

## 2023-01-01 DIAGNOSIS — Z885 Allergy status to narcotic agent status: Secondary | ICD-10-CM | POA: Diagnosis not present

## 2023-01-01 DIAGNOSIS — Z881 Allergy status to other antibiotic agents status: Secondary | ICD-10-CM | POA: Diagnosis not present

## 2023-01-01 DIAGNOSIS — Z79899 Other long term (current) drug therapy: Secondary | ICD-10-CM | POA: Insufficient documentation

## 2023-01-01 DIAGNOSIS — K922 Gastrointestinal hemorrhage, unspecified: Secondary | ICD-10-CM | POA: Diagnosis not present

## 2023-01-01 DIAGNOSIS — D500 Iron deficiency anemia secondary to blood loss (chronic): Secondary | ICD-10-CM

## 2023-01-01 DIAGNOSIS — D638 Anemia in other chronic diseases classified elsewhere: Secondary | ICD-10-CM

## 2023-01-01 DIAGNOSIS — K648 Other hemorrhoids: Secondary | ICD-10-CM | POA: Diagnosis not present

## 2023-01-01 LAB — CBC WITH DIFFERENTIAL (CANCER CENTER ONLY)
Abs Immature Granulocytes: 0.02 10*3/uL (ref 0.00–0.07)
Basophils Absolute: 0.1 10*3/uL (ref 0.0–0.1)
Basophils Relative: 2 %
Eosinophils Absolute: 0.9 10*3/uL — ABNORMAL HIGH (ref 0.0–0.5)
Eosinophils Relative: 17 %
HCT: 30.7 % — ABNORMAL LOW (ref 36.0–46.0)
Hemoglobin: 9.1 g/dL — ABNORMAL LOW (ref 12.0–15.0)
Immature Granulocytes: 0 %
Lymphocytes Relative: 17 %
Lymphs Abs: 0.9 10*3/uL (ref 0.7–4.0)
MCH: 24.5 pg — ABNORMAL LOW (ref 26.0–34.0)
MCHC: 29.6 g/dL — ABNORMAL LOW (ref 30.0–36.0)
MCV: 82.7 fL (ref 80.0–100.0)
Monocytes Absolute: 0.5 10*3/uL (ref 0.1–1.0)
Monocytes Relative: 9 %
Neutro Abs: 3.1 10*3/uL (ref 1.7–7.7)
Neutrophils Relative %: 55 %
Platelet Count: 263 10*3/uL (ref 150–400)
RBC: 3.71 MIL/uL — ABNORMAL LOW (ref 3.87–5.11)
RDW: 18.4 % — ABNORMAL HIGH (ref 11.5–15.5)
WBC Count: 5.6 10*3/uL (ref 4.0–10.5)
nRBC: 0 % (ref 0.0–0.2)

## 2023-01-01 LAB — SAMPLE TO BLOOD BANK

## 2023-01-01 LAB — IRON AND IRON BINDING CAPACITY (CC-WL,HP ONLY)
Iron: 28 ug/dL (ref 28–170)
Saturation Ratios: 7 % — ABNORMAL LOW (ref 10.4–31.8)
TIBC: 407 ug/dL (ref 250–450)
UIBC: 379 ug/dL (ref 148–442)

## 2023-01-01 LAB — FERRITIN: Ferritin: 6 ng/mL — ABNORMAL LOW (ref 11–307)

## 2023-01-01 NOTE — Assessment & Plan Note (Signed)
Hospitalization 08/11/2018: Severe GI bleed hemoglobin 4.9 (internal hemorrhoids and angiodysplasia of the colon)Received blood transfusions IV iron treatment: September 2019, March 2020, [name] June 2020, Venofer January 2021    Toxicities of iron infusion:Patient got profound diarrhea which last for 2 to 3 days after Feraheme (and prescription for Lomotil for 3 days).  With Venofer she did okay but she still had profound diarrhea as well as lethargic   10/10/2019: Hemoglobin 6.8: MCV 82, RDW 18: 2 units of PRBC given on 10/11/2019. 11/20/2019: Hemoglobin 7.1, MCV 81.2 Iron studies ferritin 7, iron saturation 6%, absolute reticulocyte count 88.3 12/26/2020: Hemoglobin 7.9 (2 units PRBC) 08/14/2021: Hemoglobin 10.5, MCV 78.4, iron saturation 27%, ferritin 14 09/16/2021: Hemoglobin 9.8, MCV 81.3 11/17/2021: Hemoglobin 9.5, MCV 80.6, RDW 18.1 11/13/2022: Hemoglobin 8.9, MCV 81.9, RDW 19.4, platelets 268 (1 unit PRBC given)   Blood transfusions: every 4 weeks   She does not want to receive Aranesp injections.    Because of her severe intolerance with the IV iron we are unable to do so.  She is also intolerant to oral iron therapy.   Current treatment: Periodic blood transfusions as needed

## 2023-01-09 ENCOUNTER — Other Ambulatory Visit: Payer: Self-pay | Admitting: Internal Medicine

## 2023-01-11 NOTE — Telephone Encounter (Signed)
Refills sent for 90 days. Pt needs Medicare Wellness with Dr Alphonsus Sias. Please help her get schedule. Thanks.

## 2023-01-11 NOTE — Telephone Encounter (Signed)
LVM for patient to c/b and schedule.  

## 2023-01-13 NOTE — Telephone Encounter (Signed)
Patient has been scheduled

## 2023-01-21 ENCOUNTER — Other Ambulatory Visit: Payer: Self-pay | Admitting: Internal Medicine

## 2023-01-21 NOTE — Telephone Encounter (Signed)
Duplicate request

## 2023-01-26 ENCOUNTER — Other Ambulatory Visit: Payer: Self-pay | Admitting: Orthopedic Surgery

## 2023-01-26 DIAGNOSIS — M1612 Unilateral primary osteoarthritis, left hip: Secondary | ICD-10-CM

## 2023-01-27 NOTE — Progress Notes (Signed)
Patient Care Team: Chelsea Schwalbe, MD as PCP - General Chelsea James Nile Dear, MD as PCP - Cardiology (Cardiology) Chelsea Keller as Physician Assistant (Physician Assistant) Chelsea Croissant, MD as Consulting Physician (Hematology and Oncology)  DIAGNOSIS:  Encounter Diagnosis  Name Primary?   Iron deficiency anemia due to chronic blood loss Yes   CHIEF COMPLIANT: Follow-up anemia   INTERVAL HISTORY: Chelsea Keller is a 88 year old with above-mentioned survive deficiency anemia. She presents to the clinic for a follow-up.  She complains of feeling fatigued.  Does have shortness of breath to minimal exertion.  She is using a wheelchair  for ambulation.   ALLERGIES:  is allergic to codeine sulfate, celecoxib, erythromycin base, and spironolactone.  MEDICATIONS:  Current Outpatient Medications  Medication Sig Dispense Refill   acetaminophen (TYLENOL) 500 MG tablet Take 1,500 mg by mouth every 12 (twelve) hours as needed (for pain- with each dose of Tramadol).     allopurinol (ZYLOPRIM) 100 MG tablet TAKE 1 TABLET BY MOUTH EVERY DAY 90 tablet 0   allopurinol (ZYLOPRIM) 300 MG tablet TAKE 1 TABLET BY MOUTH EVERY DAY 90 tablet 0   colchicine 0.6 MG tablet Take 1 tablet (0.6 mg total) by mouth 2 (two) times daily. 60 tablet 11   ELIQUIS 2.5 MG TABS tablet TAKE 1 TABLET TWICE A DAY 180 tablet 3   gabapentin (NEURONTIN) 300 MG capsule TAKE 1 CAPSULE IN THE MORNING AND 2 CAPSULES AT BEDTIME (Patient taking differently: Take 300-600 mg by mouth See admin instructions. Take 300 mg by mouth in the morning and 600 mg with supper) 270 capsule 3   hydrOXYzine (ATARAX) 10 MG tablet TAKE 1 TABLET BY MOUTH THREE TIMES A DAY AS NEEDED (Patient taking differently: Take 10 mg by mouth 3 (three) times daily as needed for itching.) 270 tablet 1   KLOR-CON M20 20 MEQ tablet TAKE 2 TABLETS (40 MEQ TOTAL) DAILY (Patient taking differently: Take 40 mEq by mouth in the morning.) 180 tablet 3    ondansetron (ZOFRAN) 4 MG tablet Take 1 tablet (4 mg total) by mouth every 8 (eight) hours as needed for nausea or vomiting. 20 tablet 0   torsemide (DEMADEX) 20 MG tablet Take 20 mg by mouth daily as needed (for fluid).     traMADol (ULTRAM) 50 MG tablet TAKE 1 TABLET (50 MG TOTAL) BY MOUTH 3 (THREE) TIMES DAILY AS NEEDED FOR MODERATE PAIN. 60 tablet 0   triamterene-hydrochlorothiazide (MAXZIDE-25) 37.5-25 MG tablet TAKE 1 TABLET DAILY 90 tablet 3   No current facility-administered medications for this visit.    PHYSICAL EXAMINATION: ECOG PERFORMANCE STATUS: 1 - Symptomatic but completely ambulatory  Vitals:   01/29/23 1057  BP: (!) 156/71  Pulse: 96  Resp: 19  Temp: 97.7 F (36.5 C)  SpO2: 100%   Filed Weights   01/29/23 1057  Weight: 251 lb 1.6 oz (113.9 kg)      LABORATORY DATA:  I have reviewed the data as listed    Latest Ref Rng & Units 12/04/2022    8:21 AM 11/13/2022    5:46 AM 11/12/2022    7:05 AM  CMP  Glucose 70 - 99 mg/dL 578  469  629   BUN 8 - 23 mg/dL 14  19  20    Creatinine 0.44 - 1.00 mg/dL 5.28  4.13  2.44   Sodium 135 - 145 mmol/L 141  137  135   Potassium 3.5 - 5.1 mmol/L 4.0  3.5  3.6  Chloride 98 - 111 mmol/L 106  107  104   CO2 22 - 32 mmol/L 29  23  22    Calcium 8.9 - 10.3 mg/dL 9.8  8.5  8.5   Total Protein 6.5 - 8.1 g/dL 5.7     Total Bilirubin 0.3 - 1.2 mg/dL 0.9     Alkaline Phos 38 - 126 U/L 145     AST 15 - 41 U/L 40     ALT 0 - 44 U/L 14       Lab Results  Component Value Date   WBC 5.5 01/29/2023   HGB 8.0 (L) 01/29/2023   HCT 26.7 (L) 01/29/2023   MCV 80.7 01/29/2023   PLT 247 01/29/2023   NEUTROABS 3.1 01/29/2023    ASSESSMENT & PLAN:  Iron deficiency anemia due to chronic blood loss Hospitalization 08/11/2018: Severe GI bleed hemoglobin 4.9 (internal hemorrhoids and angiodysplasia of the colon)Received blood transfusions IV iron treatment: September 2019, March 2020, [facility] June 2020, Venofer January 2021     Toxicities of iron infusion:Patient got profound diarrhea which last for 2 to 3 days after Feraheme (and prescription for Lomotil for 3 days).  With Venofer she did okay but she still had profound diarrhea as well as lethargic   10/10/2019: Hemoglobin 6.8: MCV 82, RDW 18: 2 units of PRBC given on 10/11/2019. 11/20/2019: Hemoglobin 7.1, MCV 81.2 Iron studies ferritin 7, iron saturation 6%, absolute reticulocyte count 88.3 12/26/2020: Hemoglobin 7.9 (2 units PRBC) 08/14/2021: Hemoglobin 10.5, MCV 78.4, iron saturation 27%, ferritin 14 09/16/2021: Hemoglobin 9.8, MCV 81.3 11/17/2021: Hemoglobin 9.5, MCV 80.6, RDW 18.1 11/13/2022: Hemoglobin 8.9, MCV 81.9, RDW 19.4, platelets 268 (1 unit PRBC given) 01/01/2023: Hemoglobin 9.1 (no blood), iron saturation 7%, ferritin 6 01/29/2023: Hemoglobin 8 (2 units PRBC)   Blood transfusions: every 4 weeks as needed   She does not want to receive Aranesp injections.    Because of her severe intolerance with the IV iron we are unable to do so.  She is also intolerant to oral iron therapy.   Current treatment: Periodic blood transfusions as needed Return to clinic in 1 month for labs and in 2 months for follow-up with me with labs. She will receive 2 units of PRBC tomorrow. She is a huge fan of the [employer].  No orders of the defined types were placed in this encounter.  The patient has a good understanding of the overall plan. she agrees with it. she will call with any problems that may develop before the next visit here. Total time spent: 30 mins including face to face time and time spent for planning, charting and co-ordination of care   Chelsea Meek, MD 01/29/23    I Chelsea Keller am acting as a Neurosurgeon for [employer]  I have reviewed the above documentation for accuracy and completeness, and I agree with the above.

## 2023-01-29 ENCOUNTER — Other Ambulatory Visit: Payer: Self-pay

## 2023-01-29 ENCOUNTER — Inpatient Hospital Stay: Payer: Medicare Other | Admitting: Hematology and Oncology

## 2023-01-29 ENCOUNTER — Inpatient Hospital Stay: Payer: Medicare Other | Attending: Hematology and Oncology

## 2023-01-29 VITALS — BP 156/71 | HR 96 | Temp 97.7°F | Resp 19 | Ht 63.0 in | Wt 251.1 lb

## 2023-01-29 DIAGNOSIS — D500 Iron deficiency anemia secondary to blood loss (chronic): Secondary | ICD-10-CM

## 2023-01-29 DIAGNOSIS — R5383 Other fatigue: Secondary | ICD-10-CM | POA: Diagnosis not present

## 2023-01-29 DIAGNOSIS — R197 Diarrhea, unspecified: Secondary | ICD-10-CM | POA: Diagnosis not present

## 2023-01-29 DIAGNOSIS — Z885 Allergy status to narcotic agent status: Secondary | ICD-10-CM | POA: Diagnosis not present

## 2023-01-29 DIAGNOSIS — Z79899 Other long term (current) drug therapy: Secondary | ICD-10-CM | POA: Insufficient documentation

## 2023-01-29 DIAGNOSIS — D649 Anemia, unspecified: Secondary | ICD-10-CM

## 2023-01-29 DIAGNOSIS — R0602 Shortness of breath: Secondary | ICD-10-CM | POA: Diagnosis not present

## 2023-01-29 DIAGNOSIS — K648 Other hemorrhoids: Secondary | ICD-10-CM | POA: Diagnosis not present

## 2023-01-29 DIAGNOSIS — Z881 Allergy status to other antibiotic agents status: Secondary | ICD-10-CM | POA: Insufficient documentation

## 2023-01-29 LAB — CBC WITH DIFFERENTIAL (CANCER CENTER ONLY)
Abs Immature Granulocytes: 0.02 10*3/uL (ref 0.00–0.07)
Basophils Absolute: 0.1 10*3/uL (ref 0.0–0.1)
Basophils Relative: 2 %
Eosinophils Absolute: 0.7 10*3/uL — ABNORMAL HIGH (ref 0.0–0.5)
Eosinophils Relative: 13 %
HCT: 26.7 % — ABNORMAL LOW (ref 36.0–46.0)
Hemoglobin: 8 g/dL — ABNORMAL LOW (ref 12.0–15.0)
Immature Granulocytes: 0 %
Lymphocytes Relative: 18 %
Lymphs Abs: 1 10*3/uL (ref 0.7–4.0)
MCH: 24.2 pg — ABNORMAL LOW (ref 26.0–34.0)
MCHC: 30 g/dL (ref 30.0–36.0)
MCV: 80.7 fL (ref 80.0–100.0)
Monocytes Absolute: 0.6 10*3/uL (ref 0.1–1.0)
Monocytes Relative: 11 %
Neutro Abs: 3.1 10*3/uL (ref 1.7–7.7)
Neutrophils Relative %: 56 %
Platelet Count: 247 10*3/uL (ref 150–400)
RBC: 3.31 MIL/uL — ABNORMAL LOW (ref 3.87–5.11)
RDW: 18.4 % — ABNORMAL HIGH (ref 11.5–15.5)
WBC Count: 5.5 10*3/uL (ref 4.0–10.5)
nRBC: 0 % (ref 0.0–0.2)

## 2023-01-29 LAB — FERRITIN: Ferritin: 5 ng/mL — ABNORMAL LOW (ref 11–307)

## 2023-01-29 LAB — TYPE AND SCREEN
ABO/RH(D): B POS
Antibody Screen: NEGATIVE
Unit division: 0
Unit division: 0

## 2023-01-29 LAB — IRON AND IRON BINDING CAPACITY (CC-WL,HP ONLY)
Iron: 16 ug/dL — ABNORMAL LOW (ref 28–170)
Saturation Ratios: 4 % — ABNORMAL LOW (ref 10.4–31.8)
TIBC: 392 ug/dL (ref 250–450)
UIBC: 376 ug/dL (ref 148–442)

## 2023-01-29 LAB — BPAM RBC
Blood Product Expiration Date: 202408222359
Blood Product Expiration Date: 202408222359
Unit Type and Rh: 7300
Unit Type and Rh: 7300

## 2023-01-29 LAB — PREPARE RBC (CROSSMATCH)

## 2023-01-29 LAB — SAMPLE TO BLOOD BANK

## 2023-01-29 NOTE — Progress Notes (Unsigned)
Orders entered for 2 units PRBC and premeds per MD. Orders verified with [place] in [place]. Pt is scheduled for

## 2023-01-29 NOTE — Assessment & Plan Note (Signed)
Hospitalization 08/11/2018: Severe GI bleed hemoglobin 4.9 (internal hemorrhoids and angiodysplasia of the colon)Received blood transfusions IV iron treatment: September 2019, March 2020, Chelsea Keller June 2020, Venofer January 2021    Toxicities of iron infusion:Patient got profound diarrhea which last for 2 to 3 days after Feraheme (and prescription for Lomotil for 3 days).  With Venofer she did okay but she still had profound diarrhea as well as lethargic   10/10/2019: Hemoglobin 6.8: MCV 82, RDW 18: 2 units of PRBC given on 10/11/2019. 11/20/2019: Hemoglobin 7.1, MCV 81.2 Iron studies ferritin 7, iron saturation 6%, absolute reticulocyte count 88.3 12/26/2020: Hemoglobin 7.9 (2 units PRBC) 08/14/2021: Hemoglobin 10.5, MCV 78.4, iron saturation 27%, ferritin 14 09/16/2021: Hemoglobin 9.8, MCV 81.3 11/17/2021: Hemoglobin 9.5, MCV 80.6, RDW 18.1 11/13/2022: Hemoglobin 8.9, MCV 81.9, RDW 19.4, platelets 268 (1 unit PRBC given) 01/01/2023: Hemoglobin 9.1 (no blood), iron saturation 7%, ferritin 6 01/29/2023:   Blood transfusions: every 4 weeks as needed   She does not want to receive Aranesp injections.    Because of her severe intolerance with the IV iron we are unable to do so.  She is also intolerant to oral iron therapy.   Current treatment: Periodic blood transfusions as needed Return to clinic in 1 month for follow-up

## 2023-01-30 ENCOUNTER — Inpatient Hospital Stay: Payer: Medicare Other

## 2023-01-30 DIAGNOSIS — Z79899 Other long term (current) drug therapy: Secondary | ICD-10-CM | POA: Diagnosis not present

## 2023-01-30 DIAGNOSIS — R5383 Other fatigue: Secondary | ICD-10-CM | POA: Diagnosis not present

## 2023-01-30 DIAGNOSIS — D649 Anemia, unspecified: Secondary | ICD-10-CM

## 2023-01-30 DIAGNOSIS — R0602 Shortness of breath: Secondary | ICD-10-CM | POA: Diagnosis not present

## 2023-01-30 DIAGNOSIS — R197 Diarrhea, unspecified: Secondary | ICD-10-CM | POA: Diagnosis not present

## 2023-01-30 DIAGNOSIS — K648 Other hemorrhoids: Secondary | ICD-10-CM | POA: Diagnosis not present

## 2023-01-30 DIAGNOSIS — D500 Iron deficiency anemia secondary to blood loss (chronic): Secondary | ICD-10-CM | POA: Diagnosis not present

## 2023-01-30 MED ORDER — ACETAMINOPHEN 325 MG PO TABS
ORAL_TABLET | ORAL | Status: AC
  Filled 2023-01-30: qty 2

## 2023-01-30 MED ORDER — DIPHENHYDRAMINE HCL 25 MG PO CAPS
25.0000 mg | ORAL_CAPSULE | Freq: Once | ORAL | Status: AC
  Administered 2023-01-30: 25 mg via ORAL

## 2023-01-30 MED ORDER — ACETAMINOPHEN 325 MG PO TABS: 650.0000 mg | ORAL_TABLET | Freq: Once | ORAL | Status: DC

## 2023-01-30 MED ORDER — SODIUM CHLORIDE 0.9% IV SOLUTION
250.0000 mL | Freq: Once | INTRAVENOUS | Status: AC
  Administered 2023-01-30: 250 mL via INTRAVENOUS

## 2023-01-30 NOTE — Patient Instructions (Signed)

## 2023-02-01 ENCOUNTER — Telehealth: Payer: Self-pay | Admitting: Hematology and Oncology

## 2023-02-01 ENCOUNTER — Ambulatory Visit (INDEPENDENT_AMBULATORY_CARE_PROVIDER_SITE_OTHER): Payer: Medicare Other | Admitting: Internal Medicine

## 2023-02-01 ENCOUNTER — Encounter: Payer: Self-pay | Admitting: Internal Medicine

## 2023-02-01 VITALS — BP 124/80 | HR 75 | Temp 97.7°F | Ht 63.0 in | Wt 253.0 lb

## 2023-02-01 DIAGNOSIS — F39 Unspecified mood [affective] disorder: Secondary | ICD-10-CM | POA: Diagnosis not present

## 2023-02-01 DIAGNOSIS — Z0000 Encounter for general adult medical examination without abnormal findings: Secondary | ICD-10-CM | POA: Diagnosis not present

## 2023-02-01 DIAGNOSIS — R0789 Other chest pain: Secondary | ICD-10-CM | POA: Diagnosis not present

## 2023-02-01 DIAGNOSIS — N1831 Chronic kidney disease, stage 3a: Secondary | ICD-10-CM

## 2023-02-01 DIAGNOSIS — I2699 Other pulmonary embolism without acute cor pulmonale: Secondary | ICD-10-CM

## 2023-02-01 DIAGNOSIS — I10 Essential (primary) hypertension: Secondary | ICD-10-CM

## 2023-02-01 DIAGNOSIS — I5032 Chronic diastolic (congestive) heart failure: Secondary | ICD-10-CM

## 2023-02-01 MED ORDER — FAMOTIDINE 40 MG PO TABS: 40.0000 mg | ORAL_TABLET | Freq: Every day | ORAL | 3 refills | Status: DC

## 2023-02-01 NOTE — Assessment & Plan Note (Signed)
Likely indigestion Will try nightly famotidine 40mg 

## 2023-02-01 NOTE — Telephone Encounter (Signed)
Scheduled appointments per 7/26 los. Talked with the patients daughter and she is aware of the made appointments for the patient.

## 2023-02-01 NOTE — Progress Notes (Signed)
Vision Screening   Right eye Left eye Both eyes  Without correction 20/40 0 20/40  With correction     Hearing Screening - Comments:: Passed whisper test

## 2023-02-01 NOTE — Assessment & Plan Note (Signed)
I have personally reviewed the Medicare Annual Wellness questionnaire and have noted 1. The patient's medical and social history 2. Their use of alcohol, tobacco or illicit drugs 3. Their current medications and supplements 4. The patient's functional ability including ADL's, fall risks, home safety risks and hearing or visual             impairment. 5. Diet and physical activities 6. Evidence for depression or mood disorders  The patients weight, height, BMI and visual acuity have been recorded in the chart I have made referrals, counseling and provided education to the patient based review of the above and I have provided the pt with a written personalized care plan for preventive services.  I have provided you with a copy of your personalized plan for preventive services. Please take the time to review along with your updated medication list.  Done with cancer screening due to age Adamantly won't take vaccines Not really able to exercise

## 2023-02-01 NOTE — Assessment & Plan Note (Signed)
Continues on the eliquis 2.5mg  bid indefinitely

## 2023-02-01 NOTE — Progress Notes (Signed)
Subjective:    Patient ID: Chelsea Keller, female    DOB: 06-17-1935, 88 y.o.   MRN: [id number]  HPI Here with daughter Cindi for Medicare wellness visit and follow up of chronic health conditions Reviewed advanced directives Reviewed other doctors--Dr Gudena--hematology, Dr McAlhany--cardiology, Dr Sylvie Farrier, Dr Dingledein--ophthal Was hospitalized overnight for anemia in the spring---no other hospitalizations or surgery Awaiting approval for injection in right hip for increased pain recently No exercise--not able. Just tries to do leg lifts, etc Vision is not good--hasn't been to the eye doctor Hearing is fine Rare alcohol and no tobacco No falls Chronic low level mood problems Does need some help cleaning herself after the toilet--independent with other ADLs. Daughter does all the housework No major memory issues--mild recall things  Recent labs show GFR 50 Followed for chronic anemia as well and iron low 3 days ago Got 2 units of blood 2 days ago  Weight is down some from last year Recent BMI 44  No chest pain--but gets heartburn/indigestion over 4-5 months Feels it in her back at times Not associated with meals though Occasional vague trouble swallowing Breathing is okay Lives on second floor--but has lift Edema variable but not as bad as before. Hasn't been taking the torsemide much Sleeps in chair--no PND Continues on eliquis--no sharp chest pain or dyspnea to suggest acute PE  Mood is the same Chronic low level depression--never severe Some degree of anhedonia--but does enjoy reading again (limited by her eye though)  Gout has been quiet finally Has trouble using her hands due to OA--also some numbness  Current Outpatient Medications on File Prior to Visit  Medication Sig Dispense Refill   acetaminophen (TYLENOL) 500 MG tablet Take 1,500 mg by mouth every 12 (twelve) hours as needed (for pain- with each dose of Tramadol).     allopurinol (ZYLOPRIM) 100 MG  tablet TAKE 1 TABLET BY MOUTH EVERY DAY 90 tablet 0   allopurinol (ZYLOPRIM) 300 MG tablet TAKE 1 TABLET BY MOUTH EVERY DAY 90 tablet 0   colchicine 0.6 MG tablet Take 1 tablet (0.6 mg total) by mouth 2 (two) times daily. 60 tablet 11   ELIQUIS 2.5 MG TABS tablet TAKE 1 TABLET TWICE A DAY 180 tablet 3   gabapentin (NEURONTIN) 300 MG capsule TAKE 1 CAPSULE IN THE MORNING AND 2 CAPSULES AT BEDTIME (Patient taking differently: Take 300-600 mg by mouth See admin instructions. Take 300 mg by mouth in the morning and 600 mg with supper) 270 capsule 3   hydrOXYzine (ATARAX) 10 MG tablet TAKE 1 TABLET BY MOUTH THREE TIMES A DAY AS NEEDED (Patient taking differently: Take 10 mg by mouth 3 (three) times daily as needed for itching.) 270 tablet 1   KLOR-CON M20 20 MEQ tablet TAKE 2 TABLETS (40 MEQ TOTAL) DAILY (Patient taking differently: Take 40 mEq by mouth in the morning.) 180 tablet 3   ondansetron (ZOFRAN) 4 MG tablet Take 1 tablet (4 mg total) by mouth every 8 (eight) hours as needed for nausea or vomiting. 20 tablet 0   torsemide (DEMADEX) 20 MG tablet Take 20 mg by mouth daily as needed (for fluid).     traMADol (ULTRAM) 50 MG tablet TAKE 1 TABLET (50 MG TOTAL) BY MOUTH 3 (THREE) TIMES DAILY AS NEEDED FOR MODERATE PAIN. 60 tablet 0   triamterene-hydrochlorothiazide (MAXZIDE-25) 37.5-25 MG tablet TAKE 1 TABLET DAILY 90 tablet 3   No current facility-administered medications on file prior to visit.    Allergies  Allergen Reactions  Codeine Sulfate Shortness Of Breath   Celecoxib Other (See Comments)    Caused vaginal bleeding   Erythromycin Base Nausea And Vomiting   Spironolactone Nausea Only and Other (See Comments)    Blurred vision, Upset stomach, lethargy    Past Medical History:  Diagnosis Date   Allergy    Anemia    Anxiety    Arthritis    Arthritis of sacroiliac joint of both sides 11/12/2017   Bilateral pulmonary embolism (HCC) 06/23/2017   Chronic diastolic CHF (congestive heart  failure) (HCC)    a. Echo 1/16:  mild LVH, EF normal, grade 1 DD, MAC   Chronic venous insufficiency    chronic LE edema   DDD (degenerative disc disease), lumbar 11/12/2017   Degenerative joint disease (DJD) of hip, Bilateral  11/12/2017   Depression    Fibromyalgia    constant pain   Hx of cardiac catheterization    a. LHC in [place] "ok" per patient with mild plaque in a single vessel - records not available   Hx of cardiovascular stress test    a. Nuclear study in 2008 normal   Hx of colonic polyps    Hypertension    Hypertriglyceridemia    Impaired fasting glucose    PONV (postoperative nausea and vomiting)    PUD (peptic ulcer disease)    hx of gastric ulcer   Pulmonary emboli (HCC) 06/2017   Vitamin B12 deficiency     Past Surgical History:  Procedure Laterality Date   ABDOMINAL HYSTERECTOMY     CATARACT EXTRACTION  10/2003   OD   CHOLECYSTECTOMY     COLONOSCOPY W/ POLYPECTOMY     COLONOSCOPY WITH PROPOFOL N/A 10/15/2014   Procedure: COLONOSCOPY WITH PROPOFOL;  Surgeon: Meryl Dare, MD;  Location: [facility] ENDOSCOPY;  Service: Endoscopy;  Laterality: N/A;   COLONOSCOPY WITH PROPOFOL N/A 03/23/2018   Procedure: COLONOSCOPY WITH PROPOFOL;  Surgeon: Iva Boop, MD;  Location[facility] ENDOSCOPY;  Service: Endoscopy;  Laterality: N/A;   ESOPHAGOGASTRODUODENOSCOPY (EGD) WITH PROPOFOL N/A 10/15/2014   Procedure: ESOPHAGOGASTRODUODENOSCOPY (EGD) WITH PROPOFOL;  Surgeon: Meryl Dare, MD;  Location: [facility] ENDOSCOPY;  Service: Endoscopy;  Laterality: N/A;   EXTERNAL FIXATION ANKLE FRACTURE     Fx.  left ankle-fixation with pins later removed sec to infection 1985   EXTERNAL FIXATION WRIST FRACTURE  1985   left with pins   EYE SURGERY     FEMUR FRACTURE SURGERY  06/2009   FRACTURE SURGERY     HARDWARE REMOVAL Left 10/05/2013   Procedure: HARDWARE REMOVAL LEFT DISTAL FEMUR;  Surgeon: Budd Palmer, MD;  Location: [facility] OR;  Service: Orthopedics;  Laterality: Left;   HEMIARTHROPLASTY  SHOULDER FRACTURE  06/2009   HOT HEMOSTASIS N/A 03/23/2018   Procedure: HOT HEMOSTASIS (ARGON PLASMA COAGULATION/BICAP);  Surgeon: Iva Boop, MD;  Location: [facility] ENDOSCOPY;  Service: Endoscopy;  Laterality: N/A;   JOINT REPLACEMENT     STERIOD INJECTION Right 10/05/2013   Procedure: STEROID INJECTION;  Surgeon: Budd Palmer, MD;  Location: [facility] OR;  Service: Orthopedics;  Laterality: Right;   TONSILLECTOMY     TONSILLECTOMY     TOTAL KNEE ARTHROPLASTY  03/09   left    Family History  Problem Relation Age of Onset   Depression Mother    Cancer Mother        uterine cancer   Heart attack Father    Cancer Brother        prostate cancer   Diabetes Maternal  Aunt    Arthritis Brother    Asthma Brother    Stroke Maternal Grandmother    Pulmonary embolism Daughter     Social History   Socioeconomic History   Marital status: Widowed    Spouse name: Not on file   Number of children: 4   Years of education: Not on file   Highest education level: Not on file  Occupational History   Occupation: retired crossing guard   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  Tobacco Use   Smoking status: Former    Current packs/day: 0.00    Average packs/day: 1 pack/day for 40.0 years (40.0 ttl pk-yrs)    Types: Cigarettes    Start date: 07/06/1953    Quit date: 07/06/1993    Years since quitting: 29.5    Passive exposure: Past   Smokeless tobacco: Never  Vaping Use   Vaping status: Never Used  Substance and Sexual Activity   Alcohol use: No    Alcohol/week: 0.0 standard drinks of alcohol   Drug use: No   Sexual activity: Not Currently  Other Topics Concern   Not on file  Social History Narrative   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[employer], lives w/ daughter and adult grandsons   Former smoker, no EtOH      No living will   Plans to do health care POA forms---wants daughter Arline Asp   Would accept resuscitation attempts---no prolonged ventilation    Absolutely no feeding tube   Social Determinants of Health   Financial Resource Strain: Not on file  Food Insecurity: No Food Insecurity (11/12/2022)   Hunger Vital Sign    Worried About Running Out of Food in the Last Year: Never true    Ran Out of Food in the Last Year: Never true  Transportation Needs: No Transportation Needs (11/12/2022)   PRAPARE - Administrator, Civil Service (Medical): No    Lack of Transportation (Non-Medical): No  Physical Activity: Not on file  Stress: Not on file  Social Connections: Not on file  Intimate Partner Violence: Not At Risk (11/12/2022)   Humiliation, Afraid, Rape, and Kick questionnaire    Fear of Current or Ex-Partner: No    Emotionally Abused: No    Physically Abused: No    Sexually Abused: No   Review of Systems Appetite is great Never sleeps well Wears seat belt Full dentures--doesn't need dentist Bowels move okay--no blood Voids okay---generally incontinent, and uses pads Uses vagisil wipes for itching (though only very intermittent) --thinks there is a tiny pimple at top of vagina. Has some itching. No discharge (but she will try monitstat) Aches and pains--back is pretty good      Objective:   Physical Exam Constitutional:      Appearance: Normal appearance. She is obese.  HENT:     Mouth/Throat:     Pharynx: No oropharyngeal exudate or posterior oropharyngeal erythema.  Eyes:     Conjunctiva/sclera: Conjunctivae normal.     Pupils: Pupils are equal, round, and reactive to light.  Cardiovascular:     Rate and Rhythm: Normal rate and regular rhythm.     Heart sounds: No murmur heard.    No gallop.     Comments: Faint pulse right foot--left absent (but foot warm) Some skips Pulmonary:     Effort: Pulmonary effort is normal.     Breath sounds: Normal breath sounds. No wheezing or rales.  Abdominal:  Palpations: Abdomen is soft.     Tenderness: There is no abdominal tenderness.  Musculoskeletal:     Cervical  back: Neck supple.     Comments: 1-2+ pitting edema in feet/calves  Lymphadenopathy:     Cervical: No cervical adenopathy.  Skin:    Findings: No rash.  Neurological:     General: No focal deficit present.     Mental Status: She is alert and oriented to person, place, and time.     Comments: Word naming 16/1 minute Recall 3/3  Psychiatric:        Mood and Affect: Mood normal.        Behavior: Behavior normal.            Assessment & Plan:

## 2023-02-01 NOTE — Assessment & Plan Note (Signed)
Is compensated Reluctant to take the torsemide--urged her when the fluid builds up

## 2023-02-01 NOTE — Assessment & Plan Note (Signed)
Has been stable Hold off on ACEI/ARB due to age

## 2023-02-01 NOTE — Assessment & Plan Note (Signed)
Has lost some weight Discussed lifestyle

## 2023-02-01 NOTE — Assessment & Plan Note (Signed)
BP Readings from Last 3 Encounters:  02/01/23 124/80  01/30/23 (!) 156/68  01/29/23 (!) 156/71   Okay on the hydrochlorothiazide

## 2023-02-01 NOTE — Assessment & Plan Note (Signed)
Chronic stable dysthymia Has done okay without and prefers no meds for this

## 2023-02-07 ENCOUNTER — Encounter: Payer: Self-pay | Admitting: Internal Medicine

## 2023-02-08 ENCOUNTER — Encounter: Payer: Self-pay | Admitting: Internal Medicine

## 2023-02-08 ENCOUNTER — Ambulatory Visit (INDEPENDENT_AMBULATORY_CARE_PROVIDER_SITE_OTHER): Payer: Medicare Other | Admitting: Internal Medicine

## 2023-02-08 VITALS — BP 110/60 | HR 85 | Temp 97.8°F | Ht 63.0 in | Wt 253.0 lb

## 2023-02-08 DIAGNOSIS — L03115 Cellulitis of right lower limb: Secondary | ICD-10-CM | POA: Diagnosis not present

## 2023-02-08 DIAGNOSIS — M109 Gout, unspecified: Secondary | ICD-10-CM

## 2023-02-08 MED ORDER — PREDNISONE 20 MG PO TABS: 40.0000 mg | ORAL_TABLET | Freq: Every day | ORAL | 0 refills | Status: DC

## 2023-02-08 MED ORDER — CEPHALEXIN 500 MG PO CAPS: 500.0000 mg | ORAL_CAPSULE | Freq: Three times a day (TID) | ORAL | 0 refills | Status: DC

## 2023-02-08 NOTE — Assessment & Plan Note (Signed)
Has not had problems for a while--on allopurinol  400 and 1 colchicine daily Will have her increase the colchicine to bid till it calms down Prednisone 40 x 4, 20 x 4

## 2023-02-08 NOTE — Progress Notes (Signed)
Subjective:    Patient ID: Chelsea Keller, female    DOB: [birth year], 88 y.o.   MRN: [id number]  HPI Here with daughter due to right foot pain  Around the top of right ankle and into calf--started 4-5 days ago Very sore--can't even touch Reminds her of infection she need hospitalization for  Some scabs--not new Not like gout--not in the joint  Current Outpatient Medications on File Prior to Visit  Medication Sig Dispense Refill   acetaminophen (TYLENOL) 500 MG tablet Take 1,500 mg by mouth every 12 (twelve) hours as needed (for pain- with each dose of Tramadol).     allopurinol (ZYLOPRIM) 100 MG tablet TAKE 1 TABLET BY MOUTH EVERY DAY 90 tablet 0   allopurinol (ZYLOPRIM) 300 MG tablet TAKE 1 TABLET BY MOUTH EVERY DAY 90 tablet 0   colchicine 0.6 MG tablet Take 1 tablet (0.6 mg total) by mouth 2 (two) times daily. 60 tablet 11   ELIQUIS 2.5 MG TABS tablet TAKE 1 TABLET TWICE A DAY 180 tablet 3   famotidine (PEPCID) 40 MG tablet Take 1 tablet (40 mg total) by mouth at bedtime. 90 tablet 3   gabapentin (NEURONTIN) 300 MG capsule TAKE 1 CAPSULE IN THE MORNING AND 2 CAPSULES AT BEDTIME (Patient taking differently: Take 300-600 mg by mouth See admin instructions. Take 300 mg by mouth in the morning and 600 mg with supper) 270 capsule 3   hydrOXYzine (ATARAX) 10 MG tablet TAKE 1 TABLET BY MOUTH THREE TIMES A DAY AS NEEDED (Patient taking differently: Take 10 mg by mouth 3 (three) times daily as needed for itching.) 270 tablet 1   KLOR-CON M20 20 MEQ tablet TAKE 2 TABLETS (40 MEQ TOTAL) DAILY (Patient taking differently: Take 40 mEq by mouth in the morning.) 180 tablet 3   ondansetron (ZOFRAN) 4 MG tablet Take 1 tablet (4 mg total) by mouth every 8 (eight) hours as needed for nausea or vomiting. 20 tablet 0   torsemide (DEMADEX) 20 MG tablet Take 20 mg by mouth daily as needed (for fluid).     traMADol (ULTRAM) 50 MG tablet TAKE 1 TABLET (50 MG TOTAL) BY MOUTH 3 (THREE) TIMES DAILY AS NEEDED FOR  MODERATE PAIN. 60 tablet 0   triamterene-hydrochlorothiazide (MAXZIDE-25) 37.5-25 MG tablet TAKE 1 TABLET DAILY 90 tablet 3   No current facility-administered medications on file prior to visit.    Allergies  Allergen Reactions   Codeine Sulfate Shortness Of Breath   Celecoxib Other (See Comments)    Caused vaginal bleeding   Erythromycin Base Nausea And Vomiting   Spironolactone Nausea Only and Other (See Comments)    Blurred vision, Upset stomach, lethargy    Past Medical History:  Diagnosis Date   Allergy    Anemia    Anxiety    Arthritis    Arthritis of sacroiliac joint of both sides 11/12/2017   Bilateral pulmonary embolism (HCC) 06/23/2017   Chronic diastolic CHF (congestive heart failure) (HCC)    a. Echo 1/16:  mild LVH, EF normal, grade 1 DD, MAC   Chronic venous insufficiency    chronic LE edema   DDD (degenerative disc disease), lumbar 11/12/2017   Degenerative joint disease (DJD) of hip, Bilateral  11/12/2017   Depression    Fibromyalgia    constant pain   Hx of cardiac catheterization    a. LHC in [place] "ok" per patient with mild plaque in a single vessel - records not available   Hx of cardiovascular stress test  a. Nuclear study in 2008 normal   Hx of colonic polyps    Hypertension    Hypertriglyceridemia    Impaired fasting glucose    PONV (postoperative nausea and vomiting)    PUD (peptic ulcer disease)    hx of gastric ulcer   Pulmonary emboli (HCC) 06/2017   Vitamin B12 deficiency     Past Surgical History:  Procedure Laterality Date   ABDOMINAL HYSTERECTOMY     CATARACT EXTRACTION  10/2003   OD   CHOLECYSTECTOMY     COLONOSCOPY W/ POLYPECTOMY     COLONOSCOPY WITH PROPOFOL N/A 10/15/2014   Procedure: COLONOSCOPY WITH PROPOFOL;  Surgeon: Meryl Dare, MD;  Location: [facility] ENDOSCOPY;  Service: Endoscopy;  Laterality: N/A;   COLONOSCOPY WITH PROPOFOL N/A 03/23/2018   Procedure: COLONOSCOPY WITH PROPOFOL;  Surgeon: Iva Boop, MD;  Location: [facility]  ENDOSCOPY;  Service: Endoscopy;  Laterality: N/A;   ESOPHAGOGASTRODUODENOSCOPY (EGD) WITH PROPOFOL N/A 10/15/2014   Procedure: ESOPHAGOGASTRODUODENOSCOPY (EGD) WITH PROPOFOL;  Surgeon: Meryl Dare, MD;  Location: [facility] ENDOSCOPY;  Service: Endoscopy;  Laterality: N/A;   EXTERNAL FIXATION ANKLE FRACTURE     Fx.  left ankle-fixation with pins later removed sec to infection 1985   EXTERNAL FIXATION WRIST FRACTURE  1985   left with pins   EYE SURGERY     FEMUR FRACTURE SURGERY  06/2009   FRACTURE SURGERY     HARDWARE REMOVAL Left 10/05/2013   Procedure: HARDWARE REMOVAL LEFT DISTAL FEMUR;  Surgeon: Budd Palmer, MD;  Location: [facility] OR;  Service: Orthopedics;  Laterality: Left;   HEMIARTHROPLASTY SHOULDER FRACTURE  06/2009   HOT HEMOSTASIS N/A 03/23/2018   Procedure: HOT HEMOSTASIS (ARGON PLASMA COAGULATION/BICAP);  Surgeon: Iva Boop, MD;  Location[facility] ENDOSCOPY;  Service: Endoscopy;  Laterality: N/A;   JOINT REPLACEMENT     STERIOD INJECTION Right 10/05/2013   Procedure: STEROID INJECTION;  Surgeon: Budd Palmer, MD;  Location: [facility] OR;  Service: Orthopedics;  Laterality: Right;   TONSILLECTOMY     TONSILLECTOMY     TOTAL KNEE ARTHROPLASTY  03/09   left    Family History  Problem Relation Age of Onset   Depression Mother    Cancer Mother        uterine cancer   Heart attack Father    Cancer Brother        prostate cancer   Diabetes Maternal Aunt    Arthritis Brother    Asthma Brother    Stroke Maternal Grandmother    Pulmonary embolism Daughter     Social History   Socioeconomic History   Marital status: Widowed    Spouse name: Not on file   Number of children: 4   Years of education: Not on file   Highest education level: Not on file  Occupational History   Occupation: retired crossing guard   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  Tobacco Use   Smoking status: Former    Current packs/day: 0.00    Average packs/day: 1 pack/day for 40.0 years (40.0 ttl  pk-yrs)    Types: Cigarettes    Start date: 07/06/1953    Quit date: 07/06/1993    Years since quitting: 29.6    Passive exposure: Past   Smokeless tobacco: Never  Vaping Use   Vaping status: Never Used  Substance and Sexual Activity   Alcohol use: No    Alcohol/week: 0.0 standard drinks of alcohol   Drug use: No   Sexual activity: Not  Currently  Other Topics Concern   Not on file  Social History Narrative   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 [employer], lives w/ daughter and adult grandsons   Former smoker, no EtOH      No living will   Plans to do health care POA forms---wants daughter Arline Asp   Would accept resuscitation attempts---no prolonged ventilation   Absolutely no feeding tube   Social Determinants of Health   Financial Resource Strain: Not on file  Food Insecurity: No Food Insecurity (11/12/2022)   Hunger Vital Sign    Worried About Running Out of Food in the Last Year: Never true    Ran Out of Food in the Last Year: Never true  Transportation Needs: No Transportation Needs (11/12/2022)   PRAPARE - Administrator, Civil Service (Medical): No    Lack of Transportation (Non-Medical): No  Physical Activity: Not on file  Stress: Not on file  Social Connections: Not on file  Intimate Partner Violence: Not At Risk (11/12/2022)   Humiliation, Afraid, Rape, and Kick questionnaire    Fear of Current or Ex-Partner: No    Emotionally Abused: No    Physically Abused: No    Sexually Abused: No   Review of Systems No fever No vomiting . Some nausea now--she may just be hungry     Objective:   Physical Exam Constitutional:      Appearance: Normal appearance.  Musculoskeletal:     Comments: Right ankle has joint swelling and is the point of extreme tenderness  Skin:    Comments: Some excoriations in lower right calf Mild redness and slight warmth but not really tender  Neurological:     Mental Status: She is alert.             Assessment & Plan:

## 2023-02-08 NOTE — Assessment & Plan Note (Signed)
Some skin breaks and mild redness/warmth Mostly the pain is gout--but will give cephalexin for 5 days---just in case

## 2023-02-08 NOTE — Telephone Encounter (Signed)
Spoke to pt's daughter, Arline Asp. They did not go to the ER. Made her an appt with Dr Alphonsus Sias for 4pm today. Will go to ER if she worsens prior to that.

## 2023-02-09 ENCOUNTER — Ambulatory Visit
Admission: RE | Admit: 2023-02-09 | Discharge: 2023-02-09 | Disposition: A | Discharge status: Home / Self Care | Payer: Medicare Other | Source: Ambulatory Visit | Attending: Orthopedic Surgery | Admitting: Orthopedic Surgery

## 2023-02-09 ENCOUNTER — Other Ambulatory Visit: Payer: Self-pay | Admitting: Orthopedic Surgery

## 2023-02-09 DIAGNOSIS — M1611 Unilateral primary osteoarthritis, right hip: Secondary | ICD-10-CM

## 2023-02-09 DIAGNOSIS — M1612 Unilateral primary osteoarthritis, left hip: Secondary | ICD-10-CM

## 2023-02-09 DIAGNOSIS — M25551 Pain in right hip: Secondary | ICD-10-CM | POA: Diagnosis not present

## 2023-02-09 MED ORDER — METHYLPREDNISOLONE ACETATE 40 MG/ML INJ SUSP (RADIOLOG
80.0000 mg | Freq: Once | INTRAMUSCULAR | Status: AC
  Administered 2023-02-09: 80 mg via INTRA_ARTICULAR

## 2023-02-09 MED ORDER — IOPAMIDOL (ISOVUE-M 200) INJECTION 41%
1.0000 mL | Freq: Once | INTRAMUSCULAR | Status: AC
  Administered 2023-02-09: 1 mL via INTRA_ARTICULAR

## 2023-02-10 DIAGNOSIS — H2512 Age-related nuclear cataract, left eye: Secondary | ICD-10-CM | POA: Diagnosis not present

## 2023-02-10 DIAGNOSIS — H269 Unspecified cataract: Secondary | ICD-10-CM | POA: Diagnosis not present

## 2023-02-10 DIAGNOSIS — Z961 Presence of intraocular lens: Secondary | ICD-10-CM | POA: Diagnosis not present

## 2023-02-18 DIAGNOSIS — H2512 Age-related nuclear cataract, left eye: Secondary | ICD-10-CM | POA: Diagnosis not present

## 2023-02-18 DIAGNOSIS — H538 Other visual disturbances: Secondary | ICD-10-CM | POA: Diagnosis not present

## 2023-02-18 DIAGNOSIS — H40003 Preglaucoma, unspecified, bilateral: Secondary | ICD-10-CM | POA: Diagnosis not present

## 2023-02-23 ENCOUNTER — Encounter: Payer: Self-pay | Admitting: Internal Medicine

## 2023-02-24 ENCOUNTER — Other Ambulatory Visit: Payer: Self-pay | Admitting: Internal Medicine

## 2023-02-25 ENCOUNTER — Other Ambulatory Visit: Payer: Self-pay

## 2023-02-25 ENCOUNTER — Telehealth: Payer: Self-pay | Admitting: Hematology and Oncology

## 2023-02-25 DIAGNOSIS — D500 Iron deficiency anemia secondary to blood loss (chronic): Secondary | ICD-10-CM

## 2023-02-25 DIAGNOSIS — D649 Anemia, unspecified: Secondary | ICD-10-CM

## 2023-02-25 NOTE — Telephone Encounter (Signed)
Canceled appointment per patients daughters request due to being sick. The daughter does not wish to reschedule the lab appointment at this time. Daughter states she will call back in if she needs anything.

## 2023-02-26 ENCOUNTER — Inpatient Hospital Stay: Payer: Medicare Other

## 2023-03-04 ENCOUNTER — Encounter: Payer: Self-pay | Admitting: Hematology and Oncology

## 2023-03-05 ENCOUNTER — Telehealth: Payer: Self-pay

## 2023-03-05 NOTE — Telephone Encounter (Signed)
LVM to pt regarding a message received from pt's daughter MyChart. Pt's daughter states that pt is feeling run down and was requesting a lab appt. This RN LVM requesting information regarding the pt's symptoms and the possibility of setting up a lab appt.

## 2023-03-05 NOTE — Telephone Encounter (Signed)
Pt's daughter returned call regarding the pt feeling "run down." This RN asked for clarification of her symptoms and the daughter states that she feels fatigued. Pt's daughter explained that she was scheduled to have labs drawn last Friday 02/26/2023 but had to cancel.  This RN scheduled a lab visit for Tuesday 03/09/2023 at 08:45a. Also verbalized to pt's daughter to go to ED if pt's symptoms worsen and feels that she may need blood products sooner. Pt's daughter verbalized understanding and stated she would take her mother to the ED if the symptoms persist beyond fatigue. Pt's daughter is aware of the appointment date and time.

## 2023-03-09 ENCOUNTER — Inpatient Hospital Stay: Payer: Medicare Other | Attending: Hematology and Oncology

## 2023-03-09 DIAGNOSIS — I10 Essential (primary) hypertension: Secondary | ICD-10-CM | POA: Insufficient documentation

## 2023-03-09 DIAGNOSIS — K922 Gastrointestinal hemorrhage, unspecified: Secondary | ICD-10-CM | POA: Diagnosis not present

## 2023-03-09 DIAGNOSIS — Z79899 Other long term (current) drug therapy: Secondary | ICD-10-CM | POA: Diagnosis not present

## 2023-03-09 DIAGNOSIS — D638 Anemia in other chronic diseases classified elsewhere: Secondary | ICD-10-CM

## 2023-03-09 DIAGNOSIS — D500 Iron deficiency anemia secondary to blood loss (chronic): Secondary | ICD-10-CM | POA: Diagnosis not present

## 2023-03-09 LAB — CBC WITH DIFFERENTIAL (CANCER CENTER ONLY)
Abs Immature Granulocytes: 0.04 10*3/uL (ref 0.00–0.07)
Basophils Absolute: 0.1 10*3/uL (ref 0.0–0.1)
Basophils Relative: 1 %
Eosinophils Absolute: 0.2 10*3/uL (ref 0.0–0.5)
Eosinophils Relative: 3 %
HCT: 32.2 % — ABNORMAL LOW (ref 36.0–46.0)
Hemoglobin: 9.9 g/dL — ABNORMAL LOW (ref 12.0–15.0)
Immature Granulocytes: 1 %
Lymphocytes Relative: 14 %
Lymphs Abs: 0.9 10*3/uL (ref 0.7–4.0)
MCH: 25.9 pg — ABNORMAL LOW (ref 26.0–34.0)
MCHC: 30.7 g/dL (ref 30.0–36.0)
MCV: 84.3 fL (ref 80.0–100.0)
Monocytes Absolute: 0.7 10*3/uL (ref 0.1–1.0)
Monocytes Relative: 11 %
Neutro Abs: 4.3 10*3/uL (ref 1.7–7.7)
Neutrophils Relative %: 70 %
Platelet Count: 256 10*3/uL (ref 150–400)
RBC: 3.82 MIL/uL — ABNORMAL LOW (ref 3.87–5.11)
RDW: 21 % — ABNORMAL HIGH (ref 11.5–15.5)
WBC Count: 6.1 10*3/uL (ref 4.0–10.5)
nRBC: 0.3 % — ABNORMAL HIGH (ref 0.0–0.2)

## 2023-03-09 LAB — IRON AND IRON BINDING CAPACITY (CC-WL,HP ONLY)
Iron: 35 ug/dL (ref 28–170)
Saturation Ratios: 10 % — ABNORMAL LOW (ref 10.4–31.8)
TIBC: 360 ug/dL (ref 250–450)
UIBC: 325 ug/dL (ref 148–442)

## 2023-03-09 LAB — FERRITIN: Ferritin: 8 ng/mL — ABNORMAL LOW (ref 11–307)

## 2023-03-10 ENCOUNTER — Encounter: Payer: Self-pay | Admitting: Cardiovascular Disease

## 2023-03-10 ENCOUNTER — Other Ambulatory Visit: Payer: Medicare Other

## 2023-03-10 ENCOUNTER — Ambulatory Visit: Payer: Medicare Other | Attending: Cardiovascular Disease | Admitting: Cardiovascular Disease

## 2023-03-10 VITALS — BP 142/60 | HR 102 | Ht 63.0 in | Wt 241.0 lb

## 2023-03-10 DIAGNOSIS — I10 Essential (primary) hypertension: Secondary | ICD-10-CM | POA: Insufficient documentation

## 2023-03-10 DIAGNOSIS — I480 Paroxysmal atrial fibrillation: Secondary | ICD-10-CM | POA: Diagnosis not present

## 2023-03-10 DIAGNOSIS — I5032 Chronic diastolic (congestive) heart failure: Secondary | ICD-10-CM | POA: Insufficient documentation

## 2023-03-10 NOTE — Patient Instructions (Signed)

## 2023-03-10 NOTE — Progress Notes (Signed)
0  Chief Complaint  Patient presents with   Follow-up    Chronic diastolic CHF   History of Present Illness: 88 yo female with history of anemia/ GI bleeding, chronic diastolic CHF, PE, obesity, HTN, PVCs, atrial fibrillation and chronic lower extremity edema here today for cardiac follow up. She has a remote history of tobacco abuse. She had a normal myoview stress test in April of 2008. Her echo in May 2010 showed normal LV function and no significant valvular issues. I saw her in January 2016 and she c/o weakness, dizziness, dyspnea.  She was seen in primary care but chest x-ray was ok. She has chronic lower extremity edema and had been off of her Lasix. She endorsed a cough productive of clear sputum. She was admitted to [facility] for diuresis and aldactone was added. Echo with normal LV function in 2016. CTA chest negative for PE (elevated d-dimer). She was found to be anemic with Hgb of 8.7 (microcytic hypochromic).  She was referred to GI and underwent EGD and colonoscopy 10/15/14. This showed gastritis, AVMs in the colon, polyps and divertculosis, hemorrhoids. Echo in 2018 with normal LV systolic function, grade 1 diastolic dysfunction and trivial AI. She was admitted to [facility] December 2018 with dyspnea and was found to have bilateral pulmonary emboli. She was started on Eliquis. She called our office 03/21/18 with c/o dyspnea, chest pressure and dizziness. We asked her to come into the ED. Chest x-ray was ok. She was found to have severe anemia (Hgb 4.7) felt to be due to active GI bleeding. She was found to have AVMs which were treated. She was transfused 4 units of pRBCs. Her symptoms resolved with resolution of her anemia. In October 2019 she was seen for dizziness. Cardiac monitor with frequent PVCs. She did not wish to start Cardizem. Most recent echo December 2020 with LVEF=60-65%, mild MR. This was done while she was admitted in December 2020 with PE, pneumonia and possible LE cellulitis. She has  remained on Eliquis.   She is here today for follow up. The patient denies any chest pain, dyspnea, palpitations, lower extremity edema, orthopnea, PND, dizziness, near syncope or syncope. She has lost 12 pounds in the last month. Only c/o back pain.   Primary Care Physician: [name], MD  Past Medical History:  Diagnosis Date   Allergy    Anemia    Anxiety    Arthritis    Arthritis of sacroiliac joint of both sides 11/12/2017   Bilateral pulmonary embolism (HCC) 06/23/2017   Chronic diastolic CHF (congestive heart failure) (HCC)    a. Echo 1/16:  mild LVH, EF normal, grade 1 DD, MAC   Chronic venous insufficiency    chronic LE edema   DDD (degenerative disc disease), lumbar 11/12/2017   Degenerative joint disease (DJD) of hip, Bilateral  11/12/2017   Depression    Fibromyalgia    constant pain   Hx of cardiac catheterization    a. LHC in [place] "ok" per patient with mild plaque in a single vessel - records not available   Hx of cardiovascular stress test    a. Nuclear study in 2008 normal   Hx of colonic polyps    Hypertension    Hypertriglyceridemia    Impaired fasting glucose    PONV (postoperative nausea and vomiting)    PUD (peptic ulcer disease)    hx of gastric ulcer   Pulmonary emboli (HCC) 06/2017   Vitamin B12 deficiency     Past  Surgical History:  Procedure Laterality Date   ABDOMINAL HYSTERECTOMY     CATARACT EXTRACTION  10/2003   OD   CHOLECYSTECTOMY     COLONOSCOPY W/ POLYPECTOMY     COLONOSCOPY WITH PROPOFOL N/A 10/15/2014   Procedure: COLONOSCOPY WITH PROPOFOL;  Surgeon: Meryl Dare, MD;  [facility]: [facility] END[facility];  Service: Endoscopy;  Laterality: N/A;   COLONOSCOPY WITH PROPOFOL N/A 03/23/2018   Procedure: COLONOSCOPY WITH PROPOFOL;  Surgeon: Iva Boop, MD;  Location: [facility] ENDOSCOPY;  Service: Endoscopy;  Laterality: N/A;   ESOPHAGOGASTRODUODENOSCOPY (EGD) WITH PROPOFOL N/A 10/15/2014   Procedure: ESOPHAGOGASTRODUODENOSCOPY (EGD) WITH PROPOFOL;   Surgeon: Meryl Dare, MD;  Location: [facility];  Service: Endoscopy;  Laterality: N/A;   EXTERNAL FIXATION ANKLE FRACTURE     Fx.  left ankle-fixation with pins later removed sec to infection 1985   EXTERNAL FIXATION WRIST FRACTURE  1985   left with pins   EYE SURGERY     FEMUR FRACTURE SURGERY  06/2009   FRACTURE SURGERY     HARDWARE REMOVAL Left 10/05/2013   Procedure: HARDWARE REMOVAL LEFT DISTAL FEMUR;  Surgeon: Budd Palmer, MD;  Location: [facility] OR;  Service: Orthopedics;  Laterality: Left;   HEMIARTHROPLASTY SHOULDER FRACTURE  06/2009   HOT HEMOSTASIS N/A 03/23/2018   Procedure: HOT HEMOSTASIS (ARGON PLASMA COAGULATION/BICAP);  Surgeon: Iva Boop, MD;  [facility] ENDOSCOPY;  Service: Endoscopy;  Laterality: N/A;   JOINT REPLACEMENT     STERIOD INJECTION Right 10/05/2013   Procedure: STEROID INJECTION;  Surgeon: Budd Palmer, MD;  Location: [facility] OR;  Service: Orthopedics;  Laterality: Right;   TONSILLECTOMY     TONSILLECTOMY     TOTAL KNEE ARTHROPLASTY  03/09   left    Current Outpatient Medications  Medication Sig Dispense Refill   acetaminophen (TYLENOL) 500 MG tablet Take 1,500 mg by mouth every 12 (twelve) hours as needed (for pain- with each dose of Tramadol).     allopurinol (ZYLOPRIM) 100 MG tablet TAKE 1 TABLET BY MOUTH EVERY DAY 90 tablet 0   allopurinol (ZYLOPRIM) 300 MG tablet TAKE 1 TABLET BY MOUTH EVERY DAY 90 tablet 0   colchicine 0.6 MG tablet Take 1 tablet (0.6 mg total) by mouth 2 (two) times daily. 60 tablet 11   ELIQUIS 2.5 MG TABS tablet TAKE 1 TABLET TWICE A DAY 180 tablet 3   famotidine (PEPCID) 40 MG tablet Take 1 tablet (40 mg total) by mouth at bedtime. 90 tablet 3   gabapentin (NEURONTIN) 300 MG capsule TAKE 1 CAPSULE IN THE MORNING AND 2 CAPSULES AT BEDTIME (Patient taking differently: Take 300-600 mg by mouth See admin instructions. Take 300 mg by mouth in the morning and 600 mg with supper) 270 capsule 3   hydrOXYzine (ATARAX) 10 MG tablet  TAKE 1 TABLET BY MOUTH THREE TIMES A DAY AS NEEDED 270 tablet 1   KLOR-CON M20 20 MEQ tablet TAKE 2 TABLETS (40 MEQ TOTAL) DAILY (Patient taking differently: Take 40 mEq by mouth in the morning.) 180 tablet 3   ondansetron (ZOFRAN) 4 MG tablet Take 1 tablet (4 mg total) by mouth every 8 (eight) hours as needed for nausea or vomiting. 20 tablet 0   torsemide (DEMADEX) 20 MG tablet Take 20 mg by mouth daily as needed (for fluid).     traMADol (ULTRAM) 50 MG tablet TAKE 1 TABLET (50 MG TOTAL) BY MOUTH 3 (THREE) TIMES DAILY AS NEEDED FOR MODERATE PAIN. 60 tablet 0   triamterene-hydrochlorothiazide (MAXZIDE-25) 37.5-25  MG tablet TAKE 1 TABLET DAILY 90 tablet 3   No current facility-administered medications for this visit.    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  Tobacco Use   Smoking status: Former    Current packs/day: 0.00    Average packs/day: 1 pack/day for 40.0 years (40.0 ttl pk-yrs)    Types: Cigarettes    Start date: 07/06/1953    Quit date: 07/06/1993    Years since quitting: 29.6    Passive exposure: Past   Smokeless tobacco: Never  Vaping Use   Vaping status: Never Used  Substance and Sexual Activity   Alcohol use: No    Alcohol/week: 0.0 standard drinks of alcohol   Drug use: No   Sexual activity: Not Currently  Other Topics Concern   Not on file  Social History Narrative   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[employer],  lives w/ daughter and adult grandsons   Former smoker, no EtOH      No living will   Plans to do health care POA forms---wants daughter Arline Asp   Would accept resuscitation attempts---no prolonged ventilation   Absolutely no feeding tube   Social Determinants of Health   Financial Resource Strain: Not on file  Food Insecurity: No Food Insecurity (11/12/2022)   Hunger Vital Sign    Worried About Running Out of Food in the Last Year: Never true    Ran Out of Food in the Last Year: Never true  Transportation Needs: No Transportation Needs (11/12/2022)   PRAPARE - Administrator, Civil Service (Medical): No    Lack of Transportation (Non-Medical): No  Physical Activity: Not on file  Stress: Not on file  Social Connections: Not on file  Intimate Partner Violence: Not At Risk (11/12/2022)   Humiliation, Afraid, Rape, and Kick questionnaire    Fear of Current or Ex-Partner: No    Emotionally Abused: No    Physically Abused: No    Sexually Abused: No    Family History  Problem Relation Age of Onset   Depression Mother    Cancer Mother        uterine cancer   Heart attack Father    Cancer Brother        prostate cancer   Diabetes Maternal Aunt    Arthritis Brother    Asthma Brother    Stroke Maternal Grandmother    Pulmonary embolism Daughter     Review of Systems:  As stated in the HPI and otherwise negative.   BP (!) 142/60   Pulse (!) 102   Ht 5\' 3"  (1.6 m)   Wt 109.3 kg   SpO2 97%   BMI 42.69 kg/m   Physical Examination:  General: Well developed, well nourished, NAD  HEENT: OP clear, mucus membranes moist  SKIN: warm, dry. No rashes. Neuro: No focal deficits  Musculoskeletal: Muscle strength 5/5 all ext  Psychiatric: Mood and affect normal  Neck: No JVD, no carotid bruits, no thyromegaly, no lymphadenopathy.  Lungs:Clear bilaterally, no wheezes, rhonci, crackles Cardiovascular: Regular rate and rhythm. No murmurs, gallops or rubs. Abdomen:Soft. Bowel  sounds present. Non-tender.  Extremities: No lower extremity edema. Pulses are 2 + in the bilateral DP/PT.  Echo December 2020:  1. Left ventricular ejection fraction, by visual estimation, is 60 to  65%. The left ventricle has normal function. There is mildly increased  left ventricular hypertrophy.   2. Left ventricular diastolic parameters are consistent with Grade I  diastolic dysfunction (impaired relaxation).   3. The left ventricle has no regional wall motion abnormalities.   4. Global right ventricle has normal systolic function.The right  ventricular size is normal. No increase in right ventricular wall  thickness.   5. Left atrial size was normal.   6. Right atrial size was normal.   7. Mild mitral annular calcification.   8. The mitral valve is abnormal. Mild mitral valve regurgitation.   9. The tricuspid valve is normal in structure. Tricuspid valve  regurgitation is mild.  10. The aortic valve is grossly normal. Aortic valve regurgitation is not  visualized.  11. The pulmonic valve was normal in structure. Pulmonic valve  regurgitation is not visualized.  12. Mildly elevated pulmonary artery systolic pressure.   EKG:  EKG is not ordered today. The ekg ordered today demonstrates   Recent Labs: 04/07/2022: TSH 4.65 11/12/2022: B Natriuretic Peptide 121.3 12/04/2022: ALT 14; BUN 14; Creatinine 1.08; Potassium 4.0; Sodium 141 03/09/2023: Hemoglobin 9.9; Platelet Count 256   Lipid Panel    Component Value Date/Time   CHOL 177 06/23/2017 1020   TRIG 116 06/18/2019 0004   HDL 44 06/23/2017 1020   CHOLHDL 4.0 06/23/2017 1020   VLDL 16 06/23/2017 1020   LDLCALC 117 (H) 06/23/2017 1020   LDLDIRECT 86.2 05/25/2008 1115     Wt Readings from Last 3 Encounters:  03/10/23 109.3 kg  02/08/23 114.8 kg  02/01/23 114.8 kg    Assessment and Plan:   1. Chronic diastolic CHF: She has normal LV systolic function by echo in December 2020. She has grade 1 diastolic dysfunction.   Weight is stable. She is Lower extremity edema. Continue torsemide.    2. HTN: BP is controlled. No changes  3. Bilateral PE: She continues to take Eliquis  4. Atrial fibrillation, paroxysmal: Sinus on exam today. EKG from May 2024 with atrial fib while admitted. She was not told about this. She is on Eliquis.   5. Iron deficiency anemia/GI bleeding: GI bleeding due to [facility] and treated with [facility] September 2019. Followed by hematology. No active bleeding recently. Iron deficiency anemia of unclear etiology requiring frequent blood transfusions.   Labs/ tests ordered today include:  No orders of the defined types were placed in this encounter.  Disposition:   F/U with me in 12 months.    Signed, Verne Carrow, MD 03/10/2023 3:51 PM    [facility] [address] [place], [place], [place]  [address] Phone: [phone]; Fax: [phone]

## 2023-03-15 ENCOUNTER — Encounter: Payer: Self-pay | Admitting: Internal Medicine

## 2023-03-15 ENCOUNTER — Ambulatory Visit (INDEPENDENT_AMBULATORY_CARE_PROVIDER_SITE_OTHER): Payer: Medicare Other | Admitting: Internal Medicine

## 2023-03-15 VITALS — BP 102/60 | HR 96 | Temp 97.8°F | Ht 63.0 in | Wt 240.0 lb

## 2023-03-15 DIAGNOSIS — N1831 Chronic kidney disease, stage 3a: Secondary | ICD-10-CM

## 2023-03-15 DIAGNOSIS — R1033 Periumbilical pain: Secondary | ICD-10-CM | POA: Insufficient documentation

## 2023-03-15 DIAGNOSIS — N390 Urinary tract infection, site not specified: Secondary | ICD-10-CM | POA: Diagnosis not present

## 2023-03-15 LAB — COMPREHENSIVE METABOLIC PANEL
ALT: 41 U/L — ABNORMAL HIGH (ref 0–35)
AST: 83 U/L — ABNORMAL HIGH (ref 0–37)
Albumin: 3 g/dL — ABNORMAL LOW (ref 3.5–5.2)
Alkaline Phosphatase: 204 U/L — ABNORMAL HIGH (ref 39–117)
BUN: 21 mg/dL (ref 6–23)
CO2: 27 meq/L (ref 19–32)
Calcium: 8.8 mg/dL (ref 8.4–10.5)
Chloride: 105 meq/L (ref 96–112)
Creatinine, Ser: 1.12 mg/dL (ref 0.40–1.20)
GFR: 44.57 mL/min — ABNORMAL LOW (ref >60.00)
Glucose, Bld: 91 mg/dL (ref 70–99)
Potassium: 4 meq/L (ref 3.5–5.1)
Sodium: 138 meq/L (ref 135–145)
Total Bilirubin: 2.3 mg/dL — ABNORMAL HIGH (ref 0.2–1.2)
Total Protein: 5.3 g/dL — ABNORMAL LOW (ref 6.0–8.3)

## 2023-03-15 LAB — POC URINALSYSI DIPSTICK (AUTOMATED)
Glucose, UA: NEGATIVE
Ketones, UA: NEGATIVE
Nitrite, UA: POSITIVE
Protein, UA: POSITIVE — AB
Spec Grav, UA: 1.02 (ref 1.010–1.025)
Urobilinogen, UA: 0.2 U/dL
pH, UA: 5.5 (ref 5.0–8.0)

## 2023-03-15 LAB — CBC
HCT: 32.3 % — ABNORMAL LOW (ref 36.0–46.0)
Hemoglobin: 10.1 g/dL — ABNORMAL LOW (ref 12.0–15.0)
MCHC: 31.3 g/dL (ref 30.0–36.0)
MCV: 83.2 fl (ref 78.0–100.0)
Platelets: 260 10*3/uL (ref 150.0–400.0)
RBC: 3.88 Mil/uL (ref 3.87–5.11)
RDW: 23.6 % — ABNORMAL HIGH (ref 11.5–15.5)
WBC: 7.2 10*3/uL (ref 4.0–10.5)

## 2023-03-15 LAB — LIPASE: Lipase: 32 U/L (ref 11.0–59.0)

## 2023-03-15 MED ORDER — ONDANSETRON HCL 4 MG PO TABS: 4.0000 mg | ORAL_TABLET | Freq: Three times a day (TID) | ORAL | 2 refills | Status: DC | PRN

## 2023-03-15 MED ORDER — CEPHALEXIN 500 MG PO CAPS: 500.0000 mg | ORAL_CAPSULE | Freq: Three times a day (TID) | ORAL | 0 refills | Status: DC

## 2023-03-15 NOTE — Progress Notes (Signed)
Subjective:    Patient ID: Chelsea Keller, female    DOB: 30-May-1935, 88 y.o.   MRN: [id number]  HPI Here with daughter due to abdominal pain  Having nausea----severe Stomach pain ----around umbilicus and then moves around that area (but not far) Goes back about a month--but intermittent Now more regular in past 2 weeks----constant (both nausea and pain) No vomiting Constantly in the bathroom --not solid. But feels she has fair amount of stool (but not emptying well) Pain worse after eating  No fever Daughter sick--but not GI symptoms  Last GFR had improved---in 50's  Current Outpatient Medications on File Prior to Visit  Medication Sig Dispense Refill   acetaminophen (TYLENOL) 500 MG tablet Take 1,500 mg by mouth every 12 (twelve) hours as needed (for pain- with each dose of Tramadol).     allopurinol (ZYLOPRIM) 100 MG tablet TAKE 1 TABLET BY MOUTH EVERY DAY 90 tablet 0   allopurinol (ZYLOPRIM) 300 MG tablet TAKE 1 TABLET BY MOUTH EVERY DAY 90 tablet 0   colchicine 0.6 MG tablet Take 1 tablet (0.6 mg total) by mouth 2 (two) times daily. 60 tablet 11   ELIQUIS 2.5 MG TABS tablet TAKE 1 TABLET TWICE A DAY 180 tablet 3   famotidine (PEPCID) 40 MG tablet Take 1 tablet (40 mg total) by mouth at bedtime. 90 tablet 3   gabapentin (NEURONTIN) 300 MG capsule TAKE 1 CAPSULE IN THE MORNING AND 2 CAPSULES AT BEDTIME (Patient taking differently: Take 300-600 mg by mouth See admin instructions. Take 300 mg by mouth in the morning and 600 mg with supper) 270 capsule 3   hydrOXYzine (ATARAX) 10 MG tablet TAKE 1 TABLET BY MOUTH THREE TIMES A DAY AS NEEDED 270 tablet 1   KLOR-CON M20 20 MEQ tablet TAKE 2 TABLETS (40 MEQ TOTAL) DAILY (Patient taking differently: Take 40 mEq by mouth in the morning.) 180 tablet 3   ondansetron (ZOFRAN) 4 MG tablet Take 1 tablet (4 mg total) by mouth every 8 (eight) hours as needed for nausea or vomiting. 20 tablet 0   torsemide (DEMADEX) 20 MG tablet Take 20 mg by  mouth daily as needed (for fluid).     traMADol (ULTRAM) 50 MG tablet TAKE 1 TABLET (50 MG TOTAL) BY MOUTH 3 (THREE) TIMES DAILY AS NEEDED FOR MODERATE PAIN. 60 tablet 0   triamterene-hydrochlorothiazide (MAXZIDE-25) 37.5-25 MG tablet TAKE 1 TABLET DAILY 90 tablet 3   No current facility-administered medications on file prior to visit.    Allergies  Allergen Reactions   Codeine Sulfate Shortness Of Breath   Celecoxib Other (See Comments)    Caused vaginal bleeding   Erythromycin Base Nausea And Vomiting   Spironolactone Nausea Only and Other (See Comments)    Blurred vision, Upset stomach, lethargy    Past Medical History:  Diagnosis Date   Allergy    Anemia    Anxiety    Arthritis    Arthritis of sacroiliac joint of both sides 11/12/2017   Bilateral pulmonary embolism (HCC) 06/23/2017   Chronic diastolic CHF (congestive heart failure) (HCC)    a. Echo 1/16:  mild LVH, EF normal, grade 1 DD, MAC   Chronic venous insufficiency    chronic LE edema   DDD (degenerative disc disease), lumbar 11/12/2017   Degenerative joint disease (DJD) of hip, Bilateral  11/12/2017   Depression    Fibromyalgia    constant pain   Hx of cardiac catheterization    a. [facility] in [place] "ok" per  patient with mild plaque in a single vessel - records not available   Hx of cardiovascular stress test    a. Nuclear study in 2008 normal   Hx of colonic polyps    Hypertension    Hypertriglyceridemia    Impaired fasting glucose    PONV (postoperative nausea and vomiting)    PUD (peptic ulcer disease)    hx of gastric ulcer   Pulmonary emboli (HCC) 06/2017   Vitamin B12 deficiency     Past Surgical History:  Procedure Laterality Date   ABDOMINAL HYSTERECTOMY     CATARACT EXTRACTION  10/2003   OD   CHOLECYSTECTOMY     COLONOSCOPY W/ POLYPECTOMY     COLONOSCOPY WITH PROPOFOL N/A 10/15/2014   Procedure: COLONOSCOPY WITH PROPOFOL;  Surgeon: Meryl Dare, MD;  Location: [facility] ENDOSCOPY;  Service: Endoscopy;   Laterality: N/A;   COLONOSCOPY WITH PROPOFOL N/A 03/23/2018   Procedure: COLONOSCOPY WITH PROPOFOL;  Surgeon: Iva Boop, MD;  Location: [facility] ENDOSCOPY;  Service: Endoscopy;  Laterality: N/A;   ESOPHAGOGASTRODUODENOSCOPY (EGD) WITH PROPOFOL N/A 10/15/2014   Procedure: ESOPHAGOGASTRODUODENOSCOPY (EGD) WITH PROPOFOL;  Surgeon: Meryl Dare, MD;  Location: [facility] ENDOSCOPY;  Service: Endoscopy;  Laterality: N/A;   EXTERNAL FIXATION ANKLE FRACTURE     Fx.  left ankle-fixation with pins later removed sec to infection 1985   EXTERNAL FIXATION WRIST FRACTURE  1985   left with pins   EYE SURGERY     FEMUR FRACTURE SURGERY  06/2009   FRACTURE SURGERY     HARDWARE REMOVAL Left 10/05/2013   Procedure: HARDWARE REMOVAL LEFT DISTAL FEMUR;  Surgeon: Budd Palmer, MD;  Location: [facility] OR;  Service: Orthopedics;  Laterality: Left;   HEMIARTHROPLASTY SHOULDER FRACTURE  06/2009   HOT HEMOSTASIS N/A 03/23/2018   Procedure: HOT HEMOSTASIS (ARGON PLASMA COAGULATION/BICAP);  Surgeon: Iva Boop, MD;  Location: [facility] ENDOSCOPY;  Service: Endoscopy;  Laterality: N/A;   JOINT REPLACEMENT     STERIOD INJECTION Right 10/05/2013   Procedure: STEROID INJECTION;  Surgeon: Budd Palmer, MD;  Location: [facility] OR;  Service: Orthopedics;  Laterality: Right;   TONSILLECTOMY     TONSILLECTOMY     TOTAL KNEE ARTHROPLASTY  03/09   left    Family History  Problem Relation Age of Onset   Depression Mother    Cancer Mother        uterine cancer   Heart attack Father    Cancer Brother        prostate cancer   Diabetes Maternal Aunt    Arthritis Brother    Asthma Brother    Stroke Maternal Grandmother    Pulmonary embolism Daughter     Social History   Socioeconomic History   Marital status: Widowed    Spouse name: Not on file   Number of children: 4   Years of education: Not on file   Highest education level: Not on file  Occupational History   Occupation: retired crossing guard   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  Tobacco Use   Smoking status: Former    Current packs/day: 0.00    Average packs/day: 1 pack/day for 40.0 years (40.0 ttl pk-yrs)    Types: Cigarettes    Start date: 07/06/1953    Quit date: 07/06/1993    Years since quitting: 29.7    Passive exposure: Past   Smokeless tobacco: Never  Vaping Use   Vaping status: Never Used  Substance and Sexual Activity  Alcohol use: No    Alcohol/week: 0.0 standard drinks of alcohol   Drug use: No   Sexual activity: Not Currently  Other Topics Concern   Not on file  Social History Narrative   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 [employer], lives w/ daughter and adult grandsons   Former smoker, no EtOH      No living will   Plans to do health care POA forms---wants daughter Arline Asp   Would accept resuscitation attempts---no prolonged ventilation   Absolutely no feeding tube   Social Determinants of Health   Financial Resource Strain: Not on file  Food Insecurity: No Food Insecurity (11/12/2022)   Hunger Vital Sign    Worried About Running Out of Food in the Last Year: Never true    Ran Out of Food in the Last Year: Never true  Transportation Needs: No Transportation Needs (11/12/2022)   PRAPARE - Administrator, Civil Service (Medical): No    Lack of Transportation (Non-Medical): No  Physical Activity: Not on file  Stress: Not on file  Social Connections: Not on file  Intimate Partner Violence: Not At Risk (11/12/2022)   Humiliation, Afraid, Rape, and Kick questionnaire    Fear of Current or Ex-Partner: No    Emotionally Abused: No    Physically Abused: No    Sexually Abused: No   Review of Systems Has transient burning sensation with urine--goes back a while No blood in stools Appetite is good--but craves sweets (like pastry) Has lost 10# or so No cough Ongoing breathing problems---gets out of breath even without exertion Has weakness in legs---hard to transfer or lift legs (daughter  has to help her)    Objective:   Physical Exam Cardiovascular:     Rate and Rhythm: Normal rate and regular rhythm.     Heart sounds: No murmur heard.    No gallop.  Pulmonary:     Effort: Pulmonary effort is normal.     Breath sounds: Normal breath sounds. No wheezing or rales.  Abdominal:     General: Bowel sounds are normal. There is no distension.     Palpations: Abdomen is soft.     Comments: Mild periumbilical tenderness but moderate RUQ tenderness  Musculoskeletal:     Cervical back: Neck supple.     Right lower leg: No edema.     Left lower leg: No edema.  Lymphadenopathy:     Cervical: No cervical adenopathy.  Neurological:     Mental Status: She is alert.            Assessment & Plan:

## 2023-03-15 NOTE — Assessment & Plan Note (Signed)
Some symptoms so will give cephalexin and check culture

## 2023-03-15 NOTE — Assessment & Plan Note (Signed)
Will recheck

## 2023-03-15 NOTE — Assessment & Plan Note (Signed)
Some urinary symptoms as well----urinalysis abnormal (leuks, nitrate) Will treat with cephalexin 500 tid x 7 days Zofran for nausea Check labs If ongoing symptoms---will go ahead with abd CT scan

## 2023-03-15 NOTE — Addendum Note (Signed)
Addended by: Eual Fines on: 03/15/2023 02:23 PM   Modules accepted: Orders

## 2023-03-16 ENCOUNTER — Other Ambulatory Visit: Payer: Self-pay | Admitting: Internal Medicine

## 2023-03-16 DIAGNOSIS — R1011 Right upper quadrant pain: Secondary | ICD-10-CM

## 2023-03-17 ENCOUNTER — Ambulatory Visit: Admit: 2023-03-17 | Payer: Medicare Other | Admitting: Ophthalmology

## 2023-03-17 ENCOUNTER — Ambulatory Visit
Admission: RE | Admit: 2023-03-17 | Discharge: 2023-03-17 | Disposition: A | Discharge status: Home / Self Care | Payer: Medicare Other | Source: Ambulatory Visit | Attending: Internal Medicine | Admitting: Internal Medicine

## 2023-03-17 DIAGNOSIS — R1011 Right upper quadrant pain: Secondary | ICD-10-CM

## 2023-03-17 DIAGNOSIS — I700 Atherosclerosis of aorta: Secondary | ICD-10-CM | POA: Diagnosis not present

## 2023-03-17 SURGERY — PHACOEMULSIFICATION, CATARACT, WITH IOL INSERTION
Anesthesia: Topical | Laterality: Left

## 2023-03-17 MED ORDER — IOPAMIDOL (ISOVUE-370) INJECTION 76%
80.0000 mL | Freq: Once | INTRAVENOUS | Status: AC | PRN
  Administered 2023-03-17: 80 mL via INTRAVENOUS

## 2023-03-18 LAB — URINE CULTURE
MICRO NUMBER:: 15439444
SPECIMEN QUALITY:: ADEQUATE

## 2023-03-19 ENCOUNTER — Encounter: Payer: Self-pay | Admitting: Internal Medicine

## 2023-03-19 MED ORDER — TRAMADOL HCL 50 MG PO TABS: 50.0000 mg | ORAL_TABLET | Freq: Three times a day (TID) | ORAL | 0 refills | Status: DC | PRN

## 2023-03-19 NOTE — Telephone Encounter (Signed)
Tramadol last given 01/21/23 #60 no refills ok to fill as pended.

## 2023-03-24 DIAGNOSIS — M25561 Pain in right knee: Secondary | ICD-10-CM | POA: Diagnosis not present

## 2023-03-24 DIAGNOSIS — M1711 Unilateral primary osteoarthritis, right knee: Secondary | ICD-10-CM | POA: Diagnosis not present

## 2023-03-25 ENCOUNTER — Other Ambulatory Visit: Payer: Self-pay | Admitting: Internal Medicine

## 2023-03-26 ENCOUNTER — Other Ambulatory Visit: Payer: Self-pay

## 2023-03-26 DIAGNOSIS — D500 Iron deficiency anemia secondary to blood loss (chronic): Secondary | ICD-10-CM

## 2023-03-30 ENCOUNTER — Inpatient Hospital Stay: Payer: Medicare Other

## 2023-03-30 ENCOUNTER — Inpatient Hospital Stay: Payer: Medicare Other | Admitting: Hematology and Oncology

## 2023-03-31 MED ORDER — FLUCONAZOLE 150 MG PO TABS: 150.0000 mg | ORAL_TABLET | ORAL | 1 refills | Status: DC

## 2023-04-03 ENCOUNTER — Other Ambulatory Visit: Payer: Self-pay

## 2023-04-03 ENCOUNTER — Emergency Department (HOSPITAL_COMMUNITY): Payer: Medicare Other

## 2023-04-03 ENCOUNTER — Inpatient Hospital Stay (HOSPITAL_COMMUNITY): Payer: Medicare Other

## 2023-04-03 ENCOUNTER — Inpatient Hospital Stay (HOSPITAL_COMMUNITY)
Admission: EM | Admit: 2023-04-03 | Discharge: 2023-04-15 | DRG: 481 | Disposition: A | Discharge status: Skilled Nursing Facility | Payer: Medicare Other | Attending: Internal Medicine | Admitting: Internal Medicine

## 2023-04-03 DIAGNOSIS — R110 Nausea: Secondary | ICD-10-CM | POA: Diagnosis not present

## 2023-04-03 DIAGNOSIS — S0990XA Unspecified injury of head, initial encounter: Secondary | ICD-10-CM | POA: Diagnosis not present

## 2023-04-03 DIAGNOSIS — Y92012 Bathroom of single-family (private) house as the place of occurrence of the external cause: Secondary | ICD-10-CM

## 2023-04-03 DIAGNOSIS — W1830XA Fall on same level, unspecified, initial encounter: Secondary | ICD-10-CM | POA: Diagnosis present

## 2023-04-03 DIAGNOSIS — D72829 Elevated white blood cell count, unspecified: Secondary | ICD-10-CM | POA: Diagnosis not present

## 2023-04-03 DIAGNOSIS — Z885 Allergy status to narcotic agent status: Secondary | ICD-10-CM

## 2023-04-03 DIAGNOSIS — Z9049 Acquired absence of other specified parts of digestive tract: Secondary | ICD-10-CM

## 2023-04-03 DIAGNOSIS — F05 Delirium due to known physiological condition: Secondary | ICD-10-CM | POA: Diagnosis not present

## 2023-04-03 DIAGNOSIS — A0472 Enterocolitis due to Clostridium difficile, not specified as recurrent: Secondary | ICD-10-CM | POA: Diagnosis not present

## 2023-04-03 DIAGNOSIS — I130 Hypertensive heart and chronic kidney disease with heart failure and stage 1 through stage 4 chronic kidney disease, or unspecified chronic kidney disease: Secondary | ICD-10-CM | POA: Diagnosis not present

## 2023-04-03 DIAGNOSIS — Z743 Need for continuous supervision: Secondary | ICD-10-CM | POA: Diagnosis not present

## 2023-04-03 DIAGNOSIS — E559 Vitamin D deficiency, unspecified: Secondary | ICD-10-CM | POA: Diagnosis present

## 2023-04-03 DIAGNOSIS — L89156 Pressure-induced deep tissue damage of sacral region: Secondary | ICD-10-CM | POA: Diagnosis not present

## 2023-04-03 DIAGNOSIS — R197 Diarrhea, unspecified: Secondary | ICD-10-CM

## 2023-04-03 DIAGNOSIS — M6281 Muscle weakness (generalized): Secondary | ICD-10-CM | POA: Diagnosis not present

## 2023-04-03 DIAGNOSIS — S299XXA Unspecified injury of thorax, initial encounter: Secondary | ICD-10-CM | POA: Diagnosis not present

## 2023-04-03 DIAGNOSIS — A09 Infectious gastroenteritis and colitis, unspecified: Secondary | ICD-10-CM | POA: Diagnosis not present

## 2023-04-03 DIAGNOSIS — M19071 Primary osteoarthritis, right ankle and foot: Secondary | ICD-10-CM | POA: Diagnosis not present

## 2023-04-03 DIAGNOSIS — Z7901 Long term (current) use of anticoagulants: Secondary | ICD-10-CM | POA: Diagnosis not present

## 2023-04-03 DIAGNOSIS — R1084 Generalized abdominal pain: Secondary | ICD-10-CM | POA: Diagnosis not present

## 2023-04-03 DIAGNOSIS — D62 Acute posthemorrhagic anemia: Secondary | ICD-10-CM | POA: Diagnosis not present

## 2023-04-03 DIAGNOSIS — I82409 Acute embolism and thrombosis of unspecified deep veins of unspecified lower extremity: Secondary | ICD-10-CM

## 2023-04-03 DIAGNOSIS — R488 Other symbolic dysfunctions: Secondary | ICD-10-CM | POA: Diagnosis not present

## 2023-04-03 DIAGNOSIS — E781 Pure hyperglyceridemia: Secondary | ICD-10-CM | POA: Diagnosis not present

## 2023-04-03 DIAGNOSIS — D464 Refractory anemia, unspecified: Secondary | ICD-10-CM | POA: Diagnosis not present

## 2023-04-03 DIAGNOSIS — R531 Weakness: Secondary | ICD-10-CM | POA: Diagnosis not present

## 2023-04-03 DIAGNOSIS — D631 Anemia in chronic kidney disease: Secondary | ICD-10-CM | POA: Diagnosis not present

## 2023-04-03 DIAGNOSIS — M7732 Calcaneal spur, left foot: Secondary | ICD-10-CM | POA: Diagnosis not present

## 2023-04-03 DIAGNOSIS — E875 Hyperkalemia: Secondary | ICD-10-CM | POA: Diagnosis present

## 2023-04-03 DIAGNOSIS — Z823 Family history of stroke: Secondary | ICD-10-CM

## 2023-04-03 DIAGNOSIS — Z8049 Family history of malignant neoplasm of other genital organs: Secondary | ICD-10-CM

## 2023-04-03 DIAGNOSIS — M4802 Spinal stenosis, cervical region: Secondary | ICD-10-CM | POA: Diagnosis not present

## 2023-04-03 DIAGNOSIS — Z79899 Other long term (current) drug therapy: Secondary | ICD-10-CM

## 2023-04-03 DIAGNOSIS — Z8249 Family history of ischemic heart disease and other diseases of the circulatory system: Secondary | ICD-10-CM | POA: Diagnosis not present

## 2023-04-03 DIAGNOSIS — M79661 Pain in right lower leg: Secondary | ICD-10-CM | POA: Diagnosis not present

## 2023-04-03 DIAGNOSIS — M797 Fibromyalgia: Secondary | ICD-10-CM | POA: Diagnosis not present

## 2023-04-03 DIAGNOSIS — Z8616 Personal history of COVID-19: Secondary | ICD-10-CM | POA: Diagnosis not present

## 2023-04-03 DIAGNOSIS — M79651 Pain in right thigh: Secondary | ICD-10-CM | POA: Diagnosis not present

## 2023-04-03 DIAGNOSIS — M5136 Other intervertebral disc degeneration, lumbar region with discogenic back pain only: Secondary | ICD-10-CM | POA: Diagnosis present

## 2023-04-03 DIAGNOSIS — I5032 Chronic diastolic (congestive) heart failure: Secondary | ICD-10-CM

## 2023-04-03 DIAGNOSIS — W19XXXA Unspecified fall, initial encounter: Secondary | ICD-10-CM | POA: Diagnosis not present

## 2023-04-03 DIAGNOSIS — Z515 Encounter for palliative care: Secondary | ICD-10-CM | POA: Diagnosis not present

## 2023-04-03 DIAGNOSIS — M1990 Unspecified osteoarthritis, unspecified site: Secondary | ICD-10-CM | POA: Diagnosis present

## 2023-04-03 DIAGNOSIS — M160 Bilateral primary osteoarthritis of hip: Secondary | ICD-10-CM | POA: Diagnosis not present

## 2023-04-03 DIAGNOSIS — S199XXA Unspecified injury of neck, initial encounter: Secondary | ICD-10-CM | POA: Diagnosis not present

## 2023-04-03 DIAGNOSIS — R609 Edema, unspecified: Secondary | ICD-10-CM | POA: Diagnosis not present

## 2023-04-03 DIAGNOSIS — R627 Adult failure to thrive: Secondary | ICD-10-CM | POA: Diagnosis not present

## 2023-04-03 DIAGNOSIS — I959 Hypotension, unspecified: Secondary | ICD-10-CM | POA: Diagnosis not present

## 2023-04-03 DIAGNOSIS — S72409A Unspecified fracture of lower end of unspecified femur, initial encounter for closed fracture: Secondary | ICD-10-CM | POA: Diagnosis present

## 2023-04-03 DIAGNOSIS — D72825 Bandemia: Secondary | ICD-10-CM | POA: Diagnosis not present

## 2023-04-03 DIAGNOSIS — S7292XD Unspecified fracture of left femur, subsequent encounter for closed fracture with routine healing: Secondary | ICD-10-CM | POA: Diagnosis not present

## 2023-04-03 DIAGNOSIS — Z818 Family history of other mental and behavioral disorders: Secondary | ICD-10-CM

## 2023-04-03 DIAGNOSIS — N1832 Chronic kidney disease, stage 3b: Secondary | ICD-10-CM

## 2023-04-03 DIAGNOSIS — S92401A Displaced unspecified fracture of right great toe, initial encounter for closed fracture: Secondary | ICD-10-CM | POA: Diagnosis present

## 2023-04-03 DIAGNOSIS — K5792 Diverticulitis of intestine, part unspecified, without perforation or abscess without bleeding: Secondary | ICD-10-CM

## 2023-04-03 DIAGNOSIS — Z888 Allergy status to other drugs, medicaments and biological substances status: Secondary | ICD-10-CM

## 2023-04-03 DIAGNOSIS — M85851 Other specified disorders of bone density and structure, right thigh: Secondary | ICD-10-CM | POA: Diagnosis not present

## 2023-04-03 DIAGNOSIS — N179 Acute kidney failure, unspecified: Secondary | ICD-10-CM | POA: Diagnosis present

## 2023-04-03 DIAGNOSIS — L89322 Pressure ulcer of left buttock, stage 2: Secondary | ICD-10-CM | POA: Diagnosis not present

## 2023-04-03 DIAGNOSIS — S72352A Displaced comminuted fracture of shaft of left femur, initial encounter for closed fracture: Secondary | ICD-10-CM | POA: Diagnosis not present

## 2023-04-03 DIAGNOSIS — M5030 Other cervical disc degeneration, unspecified cervical region: Secondary | ICD-10-CM | POA: Diagnosis not present

## 2023-04-03 DIAGNOSIS — Z471 Aftercare following joint replacement surgery: Secondary | ICD-10-CM | POA: Diagnosis not present

## 2023-04-03 DIAGNOSIS — Z96652 Presence of left artificial knee joint: Secondary | ICD-10-CM | POA: Diagnosis not present

## 2023-04-03 DIAGNOSIS — T8484XD Pain due to internal orthopedic prosthetic devices, implants and grafts, subsequent encounter: Secondary | ICD-10-CM | POA: Diagnosis not present

## 2023-04-03 DIAGNOSIS — M9712XA Periprosthetic fracture around internal prosthetic left knee joint, initial encounter: Principal | ICD-10-CM | POA: Diagnosis present

## 2023-04-03 DIAGNOSIS — S72402A Unspecified fracture of lower end of left femur, initial encounter for closed fracture: Secondary | ICD-10-CM | POA: Diagnosis not present

## 2023-04-03 DIAGNOSIS — M2540 Effusion, unspecified joint: Secondary | ICD-10-CM | POA: Diagnosis not present

## 2023-04-03 DIAGNOSIS — R9431 Abnormal electrocardiogram [ECG] [EKG]: Secondary | ICD-10-CM | POA: Diagnosis not present

## 2023-04-03 DIAGNOSIS — Z87891 Personal history of nicotine dependence: Secondary | ICD-10-CM | POA: Diagnosis not present

## 2023-04-03 DIAGNOSIS — M79671 Pain in right foot: Secondary | ICD-10-CM | POA: Diagnosis not present

## 2023-04-03 DIAGNOSIS — Z86711 Personal history of pulmonary embolism: Secondary | ICD-10-CM

## 2023-04-03 DIAGNOSIS — M7662 Achilles tendinitis, left leg: Secondary | ICD-10-CM | POA: Diagnosis not present

## 2023-04-03 DIAGNOSIS — M79604 Pain in right leg: Secondary | ICD-10-CM | POA: Diagnosis not present

## 2023-04-03 DIAGNOSIS — R41841 Cognitive communication deficit: Secondary | ICD-10-CM | POA: Diagnosis not present

## 2023-04-03 DIAGNOSIS — Z66 Do not resuscitate: Secondary | ICD-10-CM | POA: Diagnosis present

## 2023-04-03 DIAGNOSIS — Z825 Family history of asthma and other chronic lower respiratory diseases: Secondary | ICD-10-CM

## 2023-04-03 DIAGNOSIS — S3991XA Unspecified injury of abdomen, initial encounter: Secondary | ICD-10-CM | POA: Diagnosis not present

## 2023-04-03 DIAGNOSIS — K5730 Diverticulosis of large intestine without perforation or abscess without bleeding: Secondary | ICD-10-CM | POA: Diagnosis not present

## 2023-04-03 DIAGNOSIS — R278 Other lack of coordination: Secondary | ICD-10-CM | POA: Diagnosis not present

## 2023-04-03 DIAGNOSIS — Z833 Family history of diabetes mellitus: Secondary | ICD-10-CM

## 2023-04-03 DIAGNOSIS — M19072 Primary osteoarthritis, left ankle and foot: Secondary | ICD-10-CM | POA: Diagnosis not present

## 2023-04-03 DIAGNOSIS — K5732 Diverticulitis of large intestine without perforation or abscess without bleeding: Secondary | ICD-10-CM | POA: Diagnosis not present

## 2023-04-03 DIAGNOSIS — M79605 Pain in left leg: Secondary | ICD-10-CM | POA: Diagnosis not present

## 2023-04-03 DIAGNOSIS — M1711 Unilateral primary osteoarthritis, right knee: Secondary | ICD-10-CM | POA: Diagnosis not present

## 2023-04-03 DIAGNOSIS — Z043 Encounter for examination and observation following other accident: Secondary | ICD-10-CM | POA: Diagnosis not present

## 2023-04-03 DIAGNOSIS — I10 Essential (primary) hypertension: Secondary | ICD-10-CM | POA: Diagnosis not present

## 2023-04-03 DIAGNOSIS — I700 Atherosclerosis of aorta: Secondary | ICD-10-CM | POA: Diagnosis not present

## 2023-04-03 DIAGNOSIS — M85871 Other specified disorders of bone density and structure, right ankle and foot: Secondary | ICD-10-CM | POA: Diagnosis not present

## 2023-04-03 DIAGNOSIS — D638 Anemia in other chronic diseases classified elsewhere: Secondary | ICD-10-CM

## 2023-04-03 DIAGNOSIS — Z7189 Other specified counseling: Secondary | ICD-10-CM | POA: Diagnosis not present

## 2023-04-03 DIAGNOSIS — R2689 Other abnormalities of gait and mobility: Secondary | ICD-10-CM | POA: Diagnosis not present

## 2023-04-03 DIAGNOSIS — M47812 Spondylosis without myelopathy or radiculopathy, cervical region: Secondary | ICD-10-CM | POA: Diagnosis not present

## 2023-04-03 DIAGNOSIS — Z8261 Family history of arthritis: Secondary | ICD-10-CM

## 2023-04-03 DIAGNOSIS — Z6841 Body Mass Index (BMI) 40.0 and over, adult: Secondary | ICD-10-CM

## 2023-04-03 DIAGNOSIS — S3993XA Unspecified injury of pelvis, initial encounter: Secondary | ICD-10-CM | POA: Diagnosis not present

## 2023-04-03 DIAGNOSIS — S92411A Displaced fracture of proximal phalanx of right great toe, initial encounter for closed fracture: Secondary | ICD-10-CM | POA: Diagnosis not present

## 2023-04-03 DIAGNOSIS — J952 Acute pulmonary insufficiency following nonthoracic surgery: Secondary | ICD-10-CM | POA: Diagnosis not present

## 2023-04-03 DIAGNOSIS — S82402D Unspecified fracture of shaft of left fibula, subsequent encounter for closed fracture with routine healing: Secondary | ICD-10-CM | POA: Diagnosis not present

## 2023-04-03 DIAGNOSIS — R0989 Other specified symptoms and signs involving the circulatory and respiratory systems: Secondary | ICD-10-CM | POA: Diagnosis not present

## 2023-04-03 DIAGNOSIS — N390 Urinary tract infection, site not specified: Secondary | ICD-10-CM | POA: Diagnosis not present

## 2023-04-03 DIAGNOSIS — M4982 Spondylopathy in diseases classified elsewhere, cervical region: Secondary | ICD-10-CM | POA: Diagnosis not present

## 2023-04-03 DIAGNOSIS — Z881 Allergy status to other antibiotic agents status: Secondary | ICD-10-CM

## 2023-04-03 DIAGNOSIS — Z9181 History of falling: Secondary | ICD-10-CM | POA: Diagnosis not present

## 2023-04-03 DIAGNOSIS — Z86718 Personal history of other venous thrombosis and embolism: Secondary | ICD-10-CM

## 2023-04-03 LAB — CBC WITH DIFFERENTIAL/PLATELET
Abs Immature Granulocytes: 0.25 10*3/uL — ABNORMAL HIGH (ref 0.00–0.07)
Basophils Absolute: 0.1 10*3/uL (ref 0.0–0.1)
Basophils Relative: 0 %
Eosinophils Absolute: 0 10*3/uL (ref 0.0–0.5)
Eosinophils Relative: 0 %
HCT: 31.9 % — ABNORMAL LOW (ref 36.0–46.0)
Hemoglobin: 9.6 g/dL — ABNORMAL LOW (ref 12.0–15.0)
Immature Granulocytes: 2 %
Lymphocytes Relative: 4 %
Lymphs Abs: 0.6 10*3/uL — ABNORMAL LOW (ref 0.7–4.0)
MCH: 26.9 pg (ref 26.0–34.0)
MCHC: 30.1 g/dL (ref 30.0–36.0)
MCV: 89.4 fL (ref 80.0–100.0)
Monocytes Absolute: 1.2 10*3/uL — ABNORMAL HIGH (ref 0.1–1.0)
Monocytes Relative: 8 %
Neutro Abs: 12.2 10*3/uL — ABNORMAL HIGH (ref 1.7–7.7)
Neutrophils Relative %: 86 %
Platelets: 273 10*3/uL (ref 150–400)
RBC: 3.57 MIL/uL — ABNORMAL LOW (ref 3.87–5.11)
RDW: 23.7 % — ABNORMAL HIGH (ref 11.5–15.5)
WBC: 14.3 10*3/uL — ABNORMAL HIGH (ref 4.0–10.5)
nRBC: 0.4 % — ABNORMAL HIGH (ref 0.0–0.2)

## 2023-04-03 LAB — BASIC METABOLIC PANEL
Anion gap: 9 (ref 5–15)
BUN: 27 mg/dL — ABNORMAL HIGH (ref 8–23)
CO2: 25 mmol/L (ref 22–32)
Calcium: 8.5 mg/dL — ABNORMAL LOW (ref 8.9–10.3)
Chloride: 102 mmol/L (ref 98–111)
Creatinine, Ser: 1.31 mg/dL — ABNORMAL HIGH (ref 0.44–1.00)
GFR, Estimated: 40 mL/min — ABNORMAL LOW (ref >60)
Glucose, Bld: 118 mg/dL — ABNORMAL HIGH (ref 70–99)
Potassium: 4.1 mmol/L (ref 3.5–5.1)
Sodium: 136 mmol/L (ref 135–145)

## 2023-04-03 MED ORDER — SODIUM CHLORIDE 0.9 % IV SOLN
3.0000 g | Freq: Three times a day (TID) | INTRAVENOUS | Status: DC
  Administered 2023-04-04 – 2023-04-06 (×5): 3 g via INTRAVENOUS
  Filled 2023-04-03 (×5): qty 8

## 2023-04-03 MED ORDER — PROCHLORPERAZINE EDISYLATE 10 MG/2ML IJ SOLN
10.0000 mg | Freq: Four times a day (QID) | INTRAMUSCULAR | Status: DC | PRN
  Administered 2023-04-03: 10 mg via INTRAVENOUS
  Filled 2023-04-03 (×2): qty 2

## 2023-04-03 MED ORDER — LACTATED RINGERS IV BOLUS
1000.0000 mL | Freq: Once | INTRAVENOUS | Status: AC
  Administered 2023-04-03: 1000 mL via INTRAVENOUS

## 2023-04-03 MED ORDER — IOHEXOL 300 MG/ML  SOLN
100.0000 mL | Freq: Once | INTRAMUSCULAR | Status: AC | PRN
  Administered 2023-04-03: 100 mL via INTRAVENOUS

## 2023-04-03 MED ORDER — ONDANSETRON HCL 4 MG/2ML IJ SOLN
4.0000 mg | Freq: Once | INTRAMUSCULAR | Status: AC
  Administered 2023-04-03: 4 mg via INTRAVENOUS
  Filled 2023-04-03: qty 2

## 2023-04-03 MED ORDER — FENTANYL CITRATE PF 50 MCG/ML IJ SOSY
50.0000 ug | PREFILLED_SYRINGE | INTRAMUSCULAR | Status: DC | PRN
  Administered 2023-04-03 – 2023-04-04 (×3): 50 ug via INTRAVENOUS
  Filled 2023-04-03 (×3): qty 1

## 2023-04-03 MED ORDER — FENTANYL CITRATE PF 50 MCG/ML IJ SOSY
100.0000 ug | PREFILLED_SYRINGE | INTRAMUSCULAR | Status: DC | PRN
  Administered 2023-04-03 (×2): 100 ug via INTRAVENOUS
  Filled 2023-04-03 (×2): qty 2

## 2023-04-03 MED ORDER — LACTATED RINGERS IV SOLN: INTRAVENOUS | Status: AC

## 2023-04-03 MED ORDER — FENTANYL CITRATE PF 50 MCG/ML IJ SOSY
50.0000 ug | PREFILLED_SYRINGE | INTRAMUSCULAR | Status: DC | PRN
  Administered 2023-04-03 (×2): 50 ug via INTRAVENOUS
  Filled 2023-04-03 (×2): qty 1

## 2023-04-03 MED ORDER — SODIUM CHLORIDE 0.9 % IV SOLN
3.0000 g | Freq: Once | INTRAVENOUS | Status: AC
  Administered 2023-04-03: 3 g via INTRAVENOUS
  Filled 2023-04-03: qty 8

## 2023-04-03 NOTE — ED Provider Notes (Signed)
[place] EMERGENCY DEPARTMENT AT [facility] Provider Note   CSN: [id number] Arrival date & time: 04/03/23  1323     History Chief Complaint  Patient presents with   Fall   Leg Pain    HPI Chelsea Keller is a 88 y.o. female presenting for fall. Fall trying to walk back from the bathroom. HX of blood clots on eliquis.  States that she lost her balance.  Has pain in her left hip, right femur and all through her back.   Patient's recorded medical, surgical, social, medication list and allergies were reviewed in the Snapshot window as part of the initial history.   Review of Systems   Review of Systems  Constitutional:  Negative for chills and fever.  HENT:  Negative for ear pain and sore throat.   Eyes:  Negative for pain and visual disturbance.  Respiratory:  Negative for cough and shortness of breath.   Cardiovascular:  Negative for chest pain and palpitations.  Gastrointestinal:  Negative for abdominal pain and vomiting.  Genitourinary:  Negative for dysuria and hematuria.  Musculoskeletal:  Positive for back pain and gait problem. Negative for arthralgias.  Skin:  Negative for color change and rash.  Neurological:  Negative for seizures and syncope.  All other systems reviewed and are negative.   Physical Exam Updated Vital Signs BP 132/74   Pulse (!) 116   Temp 98.7 F (37.1 C) (Oral)   Resp 16   Ht 5\' 3"  (1.6 m)   Wt 108 kg   SpO2 96%   BMI 42.18 kg/m  Physical Exam Vitals and nursing note reviewed.  Constitutional:      General: She is not in acute distress.    Appearance: She is well-developed.  HENT:     Head: Normocephalic and atraumatic.  Eyes:     Conjunctiva/sclera: Conjunctivae normal.  Cardiovascular:     Rate and Rhythm: Normal rate and regular rhythm.     Heart sounds: No murmur heard. Pulmonary:     Effort: Pulmonary effort is normal. No respiratory distress.     Breath sounds: Normal breath sounds.  Abdominal:      Palpations: Abdomen is soft.     Tenderness: There is no abdominal tenderness.  Musculoskeletal:        General: Tenderness (TTP right femur, left hip. Difficult spinal exam 2/2 habitus.) present. No swelling.     Cervical back: Neck supple.  Skin:    General: Skin is warm and dry.     Capillary Refill: Capillary refill takes less than 2 seconds.  Neurological:     Mental Status: She is alert.  Psychiatric:        Mood and Affect: Mood normal.      ED Course/ Medical Decision Making/ A&P    Procedures Procedures   Medications Ordered in ED Medications  fentaNYL (SUBLIMAZE) injection 50 mcg (50 mcg Intravenous Given 04/03/23 1719)  Ampicillin-Sulbactam (UNASYN) 3 g in sodium chloride 0.9 % 100 mL IVPB (has no administration in time range)  lactated ringers bolus 1,000 mL (has no administration in time range)  ondansetron (ZOFRAN) injection 4 mg (4 mg Intravenous Given 04/03/23 1510)  lactated ringers bolus 1,000 mL (0 mLs Intravenous Stopped 04/03/23 1646)  iohexol (OMNIPAQUE) 300 MG/ML solution 100 mL (100 mLs Intravenous Contrast Given 04/03/23 1624)  ondansetron (ZOFRAN) injection 4 mg (4 mg Intravenous Given 04/03/23 1646)    Medical Decision Making:    Chelsea Keller is a 88 y.o. female who  presented to the ED today with a moderate mechanisma trauma, detailed above.    Handoff received from EMS.  Patient placed on continuous vitals and telemetry monitoring while in ED which was reviewed periodically.   Given this mechanism of trauma, a full physical exam was performed. Notably, patient was HDS in NAD, Multiple areas of pain.   Reviewed and confirmed nursing documentation for past medical history, family history, social history.    Initial Assessment/Plan:   This is a patient presenting with a moderate mechanism trauma.  As such, I have considered intracranial injuries including intracranial hemorrhage, intrathoracic injuries including blunt myocardial or blunt lung injury,  blunt abdominal injuries including aortic dissection, bladder injury, spleen injury, liver injury and I have considered orthopedic injuries including extremity or spinal injury.  With the patient's presentation of moderate mechanism trauma and abnormalities detailed above, patient warrants aggressive evaluation for potential traumatic injuries. Will proceed with non-level trauma protocol to evaluate for potential injuries. Will proceed with CT Head, Cervical/Thoracic/Lumbar Spine, and Chest/Abdomen/Pelvis with contrast. Also performed targetted LE XRS. Scans resulted with Evidence of both diverticulitis and a distal left femur fracture. Discussed case with orthopedic surgery as a consult to assist with management of these injuries.   Reviewed the below an agree with radiology.  CT CHEST ABDOMEN PELVIS W CONTRAST  Result Date: 04/03/2023 CLINICAL DATA:  Patient fell. Poly trauma, blunt. Chronic anticoagulation. EXAM: CT CHEST, ABDOMEN, AND PELVIS WITH CONTRAST TECHNIQUE: Multidetector CT imaging of the chest, abdomen and pelvis was performed following the standard protocol during bolus administration of intravenous contrast. RADIATION DOSE REDUCTION: This exam was performed according to the departmental dose-optimization program which includes automated exposure control, adjustment of the mA and/or kV according to patient size and/or use of iterative reconstruction technique. CONTRAST:  OMNIPAQUE IOHEXOL 300 MG/ML  SOLN COMPARISON:  Abdomen and pelvis CT 03/17/2023 FINDINGS: CT CHEST FINDINGS Cardiovascular: The heart size is normal. No substantial pericardial effusion. Coronary artery calcification is evident. Moderate atherosclerotic calcification is noted in the wall of the thoracic aorta. Enlargement of the pulmonary outflow tract/main pulmonary arteries suggests pulmonary arterial hypertension. Mediastinum/Nodes: No mediastinal lymphadenopathy. There is no hilar lymphadenopathy. The esophagus has  normal imaging features. There is no axillary lymphadenopathy. Lungs/Pleura: No suspicious pulmonary nodule or mass. No focal airspace consolidation. No pleural effusion. Musculoskeletal: No worrisome lytic or sclerotic osseous abnormality. Chronic posttraumatic/degenerative deformity noted right shoulder. No sternal fracture. No evidence for thoracic spine fracture. CT ABDOMEN PELVIS FINDINGS Hepatobiliary: No suspicious focal abnormality within the liver parenchyma. Gallbladder is surgically absent. No intrahepatic or extrahepatic biliary dilation. Pancreas: No focal mass lesion. No dilatation of the main duct. No intraparenchymal cyst. No peripancreatic edema. Spleen: No splenomegaly. No suspicious focal mass lesion. Adrenals/Urinary Tract: Small left adrenal nodule stable since 04/11/2018 consistent with benign etiology such as adenoma. No followup imaging is recommended. Vascular calcification seen in the hilum of each kidney. No suspicious renal mass lesion. Small cyst noted lower pole left kidney. No followup imaging is recommended. No evidence for hydroureter. The urinary bladder appears normal for the degree of distention. Stomach/Bowel: Stomach is unremarkable. No gastric wall thickening. No evidence of outlet obstruction. Duodenum is normally positioned as is the ligament of Treitz. Portions of the small bowel and right colon have been excluded from the field of view. Within this limitation there is no small bowel dilatation to suggest obstruction. No colonic dilatation. Diverticular changes are noted in the left colon without evidence of diverticulitis. There is some subtle  pericolonic edema/inflammation along the distal descending colon (axial image 71/series 4) suggesting diverticulitis Vascular/Lymphatic: There is moderate atherosclerotic calcification of the abdominal aorta without aneurysm. There is no gastrohepatic or hepatoduodenal ligament lymphadenopathy. No retroperitoneal or mesenteric  lymphadenopathy. No pelvic sidewall lymphadenopathy. Reproductive: Hysterectomy.  There is no adnexal mass. Other: No intraperitoneal free fluid. Musculoskeletal: No worrisome lytic or sclerotic osseous abnormality. L4 superior endplate compression deformity is stable since 03/17/2023. IMPRESSION: 1. No evidence for acute traumatic injury in the chest, abdomen, or pelvis. 2. Subtle pericolonic edema/inflammation along the distal descending colon suggesting diverticulitis. To suggest perforation or abscess. 3. Enlargement of the pulmonary outflow tract/main pulmonary arteries suggests pulmonary arterial hypertension. 4.  Aortic Atherosclerosis (ICD10-I70.0). No findings Electronically Signed   By: Kennith Center M.D.   On: 04/03/2023 17:30   CT HEAD WO CONTRAST ( )  Result Date: 04/03/2023 CLINICAL DATA:  Head trauma.  Fall.  Patient on blood thinner. EXAM: CT HEAD WITHOUT CONTRAST TECHNIQUE: Contiguous axial images were obtained from the base of the skull through the vertex without intravenous contrast. RADIATION DOSE REDUCTION: This exam was performed according to the departmental dose-optimization program which includes automated exposure control, adjustment of the mA and/or kV according to patient size and/or use of iterative reconstruction technique. COMPARISON:  Head CT dated 07/17/2016. FINDINGS: Brain: Mild age-related atrophy and chronic microvascular ischemic changes. There is no acute intracranial hemorrhage. No mass effect or midline shift. No extra-axial fluid collection. Vascular: No hyperdense vessel or unexpected calcification. Skull: Normal. Negative for fracture or focal lesion. Sinuses/Orbits: No acute finding. Other: None IMPRESSION: 1. No acute intracranial pathology. 2. Mild age-related atrophy and chronic microvascular ischemic changes. Electronically Signed   By: Elgie Collard M.D.   On: 04/03/2023 17:27   CT CERVICAL SPINE WO CONTRAST  Result Date: 04/03/2023 CLINICAL DATA:  Fall.   Polytrauma, blunt EXAM: CT CERVICAL SPINE WITHOUT CONTRAST TECHNIQUE: Multidetector CT imaging of the cervical spine was performed without intravenous contrast. Multiplanar CT image reconstructions were also generated. RADIATION DOSE REDUCTION: This exam was performed according to the departmental dose-optimization program which includes automated exposure control, adjustment of the mA and/or kV according to patient size and/or use of iterative reconstruction technique. COMPARISON:  None Available. FINDINGS: Alignment: 3 mm degenerative anterolisthesis of C3 on C4. Skull base and vertebrae: Diffuse osteopenia. No fracture or focal bone lesion. Soft tissues and spinal canal: No prevertebral fluid or swelling. No visible canal hematoma. Disc levels: Advanced diffuse degenerative facet disease bilaterally. Mild degenerative disc disease with disc space narrowing and anterior spurring. No visible disc herniation. Upper chest: No acute findings Other: None IMPRESSION: Moderate to advanced multilevel degenerative spondylosis. No acute bony abnormality. Electronically Signed   By: Charlett Nose M.D.   On: 04/03/2023 17:22   DG Knee 2 Views Left  Result Date: 04/03/2023 CLINICAL DATA:  Fall.  Pain. EXAM: LEFT KNEE - 1-2 VIEW COMPARISON:  None Available. FINDINGS: Status post tricompartmental knee replacement. Acute comminuted fracture of the distal femoral metaphysis identified at the level of the femoral component. There is approximately 1/2 shaft with the posterior displacement of the femoral component of the knee replacement relative to the metaphyseal shaft. IMPRESSION: Acute comminuted fracture of the distal femoral metaphysis at the level of the femoral component of the knee replacement. Electronically Signed   By: Kennith Center M.D.   On: 04/03/2023 15:17   DG FEMUR, MIN 2 VIEWS RIGHT  Result Date: 04/03/2023 CLINICAL DATA:  Fall with pain. EXAM: RIGHT FEMUR 2  VIEWS COMPARISON:  None Available. FINDINGS:  Study limited by osteopenia and body habitus. No displaced fracture of the femoral neck evident. Degenerative changes are seen in all 3 compartments of the knee with small joint effusion. IMPRESSION: 1. Limited study due to osteopenia and body habitus. No displaced fracture evident. 2. Degenerative changes in the right knee with small joint effusion. Electronically Signed   By: Kennith Center M.D.   On: 04/03/2023 15:16   DG Hip Unilat W or Wo Pelvis 2-3 Views Left  Result Date: 04/03/2023 CLINICAL DATA:  Fall.  Left leg pain. EXAM: DG HIP (WITH OR WITHOUT PELVIS) 2-3V LEFT COMPARISON:  04/03/2017 FINDINGS: Bones are diffusely demineralized. No evidence for pubic ramus or sacral fracture. AP and cross-table lateral views of the left hip show no evidence for a displaced left femoral neck fracture although assessment is limited by osteopenia and body habitus. IMPRESSION: No evidence for displaced left femoral neck fracture although assessment is limited by osteopenia and body habitus. If there is high clinical concern for occult fracture, CT recommended to further evaluate. Electronically Signed   By: Kennith Center M.D.   On: 04/03/2023 15:15   DG Chest Portable 1 View  Result Date: 04/03/2023 CLINICAL DATA:  Fall.  Pain everywhere. EXAM: PORTABLE CHEST 1 VIEW COMPARISON:  11/12/2022 FINDINGS: The cardio pericardial silhouette is enlarged. Mild vascular congestion without edema. The lungs are clear without focal pneumonia, edema, pneumothorax or pleural effusion. No acute bony abnormality. IMPRESSION: Enlargement of the cardiopericardial silhouette with mild vascular congestion. Electronically Signed   By: Kennith Center M.D.   On: 04/03/2023 15:14    Final Reassessment and Plan:   The patient will also require admission to medicine given her comorbid diverticulitis.  Will treat with antibiotics and IV fluids given duration of symptoms. Consulted to orthopedics who agreed with need for further care and  management recommended admission over at [facility].  Disposition:   Based on the above findings, I believe this patient is stable for admission.    Patient/family educated about specific findings on our evaluation and explained exact reasons for admission.  Patient/family educated about clinical situation and time was allowed to answer questions.   Admission team communicated with and agreed with need for admission. Patient admitted. Patient ready to move at this time.     Emergency Department Medication Summary:   Medications  fentaNYL (SUBLIMAZE) injection 50 mcg (50 mcg Intravenous Given 04/03/23 1719)  Ampicillin-Sulbactam (UNASYN) 3 g in sodium chloride 0.9 % 100 mL IVPB (has no administration in time range)  lactated ringers bolus 1,000 mL (has no administration in time range)  ondansetron (ZOFRAN) injection 4 mg (4 mg Intravenous Given 04/03/23 1510)  lactated ringers bolus 1,000 mL (0 mLs Intravenous Stopped 04/03/23 1646)  iohexol (OMNIPAQUE) 300 MG/ML solution 100 mL (100 mLs Intravenous Contrast Given 04/03/23 1624)  ondansetron (ZOFRAN) injection 4 mg (4 mg Intravenous Given 04/03/23 1646)           Clinical Impression:  1. Generalized abdominal pain      Data Unavailable   Final Clinical Impression(s) / ED Diagnoses Final diagnoses:  Generalized abdominal pain    Rx / DC Orders ED Discharge Orders     None         Glyn Ade, MD 04/03/23 2307

## 2023-04-03 NOTE — Progress Notes (Signed)
PHARMACY NOTE:  ANTIMICROBIAL RENAL DOSAGE ADJUSTMENT  Current antimicrobial regimen includes a mismatch between antimicrobial dosage and estimated renal function.  As per policy approved by the Pharmacy & Therapeutics and Medical Executive Committees, the antimicrobial dosage will be adjusted accordingly.  Current antimicrobial dosage:  Unasyn 3gm IV q6h  Indication: Intra-abdominal infection  Renal Function:  Estimated Creatinine Clearance: 36.3 mL/min (A) (by C-G formula based on SCr of 1.31 mg/dL (H)). []      On intermittent HD, scheduled: []      On CRRT    Antimicrobial dosage has been changed to:  Unasyn 3gm IV q8h  Additional comments:   Thank you for allowing pharmacy to be a part of this patient's care.  Harland German, PharmD Clinical Pharmacist **Pharmacist phone directory can now be found on [email or web address] ([email or web address]).  Listed under [facility].

## 2023-04-03 NOTE — ED Triage Notes (Signed)
Pt BIBA from home. C/o L leg pain after mech fall this morning. NO LOC, no head trauma. Does take eliquis.  Aox4

## 2023-04-03 NOTE — H&P (Signed)
History and Physical    Patient: Chelsea Keller NWG:[id number] DOB: [birth year] DOA: 04/03/2023 DOS: the patient was seen and examined on 04/03/2023 PCP: Chelsea Schwalbe, MD  Patient coming from: Home  Chief Complaint:  Chief Complaint  Patient presents with   Fall   Leg Pain   HPI: Chelsea Keller is an 88 y.o. female with medical history significant of chronic diastolic CHF, essential hypertension, chronic blood loss anemia requiring blood transfusion, morbid obesity who presents to the emergency department from home via EMS due to fall sustained at home.  Patient states that she lost her balance while coming out of the bathroom and heading to her bedroom due to her legs giving out on her.  She complained of left hip pain with difficulty in being able to bear weight on the left leg. Patient also complained of 4 to 5-week onset of abdominal pain which was mainly in the lower abdomen.  She complained of constipation about 2 weeks ago, she took MiraLAX and after this, she developed a nonbloody diarrhea.  She denies fever, chills, nausea or vomiting.  ED Course:  In the emergency department, she was hemodynamically stable.  Workup in the ED showed leukocytosis, normocytic anemia.  BMP was normal except for blood glucose of 118, BUN 27, creatinine 1.31. Left knee x-ray showed acute comminuted fracture of the distal femoral metaphysis at the level of the femoral component of the knee replacement. Right femoral and right hip x-ray showed no fracture CT chest, abdomen and pelvis with contrast showed no evidence of acute traumatic injury in the chest, abdomen or pelvis.  Subtotal pericolonic edema/inflammation along the distal descending colon suggesting diverticulitis.  To suggest perforation or abscess. CT head without contrast showed no acute intracranial pathology She was treated with IV Unasyn, IV hydration was provided and Zofran was given. Orthopedist on-call (Dr. Hulda Humphrey) was consulted and  recommended admitting patient to [facility] and that doctor Handy (orthopedics) will consult on patient in the morning and the patient should be placed n.p.o. at midnight. Hospitalist was asked to admit patient for further evaluation and management.  Review of Systems: Review of systems as noted in the HPI. All other systems reviewed and are negative.   Past Medical History:  Diagnosis Date   Allergy    Anemia    Anxiety    Arthritis    Arthritis of sacroiliac joint of both sides 11/12/2017   Bilateral pulmonary embolism (HCC) 06/23/2017   Chronic diastolic CHF (congestive heart failure) (HCC)    a. Echo 1/16:  mild LVH, EF normal, grade 1 DD, MAC   Chronic venous insufficiency    chronic LE edema   DDD (degenerative disc disease), lumbar 11/12/2017   Degenerative joint disease (DJD) of hip, Bilateral  11/12/2017   Depression    Fibromyalgia    constant pain   Hx of cardiac catheterization    a. LHC in [place] "ok" per patient with mild plaque in a single vessel - records not available   Hx of cardiovascular stress test    a. Nuclear study in 2008 normal   Hx of colonic polyps    Hypertension    Hypertriglyceridemia    Impaired fasting glucose    PONV (postoperative nausea and vomiting)    PUD (peptic ulcer disease)    hx of gastric ulcer   Pulmonary emboli (HCC) 06/2017   Vitamin B12 deficiency    Past Surgical History:  Procedure Laterality Date   ABDOMINAL HYSTERECTOMY  CATARACT EXTRACTION  10/2003   OD   CHOLECYSTECTOMY     COLONOSCOPY W/ POLYPECTOMY     COLONOSCOPY WITH PROPOFOL N/A 10/15/2014   Procedure: COLONOSCOPY WITH PROPOFOL;  Surgeon: Meryl Dare, MD;  Location: [facility] ENDOSCOPY;  Service: Endoscopy;  Laterality: N/A;   COLONOSCOPY WITH PROPOFOL N/A 03/23/2018   Procedure: COLONOSCOPY WITH PROPOFOL;  Surgeon: Iva Boop, MD;  Location: [facility] ENDOSCOPY;  Service: Endoscopy;  Laterality: N/A;   ESOPHAGOGASTRODUODENOSCOPY (EGD) WITH PROPOFOL N/A 10/15/2014    Procedure: ESOPHAGOGASTRODUODENOSCOPY (EGD) WITH PROPOFOL;  Surgeon: Meryl Dare, MD;  Location: [facility] ENDOSCOPY;  Service: Endoscopy;  Laterality: N/A;   EXTERNAL FIXATION ANKLE FRACTURE     Fx.  left ankle-fixation with pins later removed sec to infection 1985   EXTERNAL FIXATION WRIST FRACTURE  1985   left with pins   EYE SURGERY     FEMUR FRACTURE SURGERY  06/2009   FRACTURE SURGERY     HARDWARE REMOVAL Left 10/05/2013   Procedure: HARDWARE REMOVAL LEFT DISTAL FEMUR;  Surgeon: Budd Palmer, MD;  Location: [facility] OR;  Service: Orthopedics;  Laterality: Left;   HEMIARTHROPLASTY SHOULDER FRACTURE  06/2009   HOT HEMOSTASIS N/A 03/23/2018   Procedure: HOT HEMOSTASIS (ARGON PLASMA COAGULATION/BICAP);  Surgeon: Iva Boop, MD;  Location: [facility] ENDOSCOPY;  Service: Endoscopy;  Laterality: N/A;   JOINT REPLACEMENT     STERIOD INJECTION Right 10/05/2013   Procedure: STEROID INJECTION;  Surgeon: Budd Palmer, MD;  Location: [facility] OR;  Service: Orthopedics;  Laterality: Right;   TONSILLECTOMY     TONSILLECTOMY     TOTAL KNEE ARTHROPLASTY  03/09   left    Social History:  reports that she quit smoking about 29 years ago. Her smoking use included cigarettes. She started smoking about 69 years ago. She has a 40 pack-year smoking history. She has been exposed to tobacco smoke. She has never used smokeless tobacco. She reports that she does not drink alcohol and does not use drugs.   Allergies  Allergen Reactions   Codeine Sulfate Shortness Of Breath   Celecoxib Other (See Comments)    Caused vaginal bleeding   Erythromycin Base Nausea And Vomiting   Spironolactone Nausea Only and Other (See Comments)    Blurred vision, Upset stomach, lethargy    Family History  Problem Relation Age of Onset   Depression Mother    Cancer Mother        uterine cancer   Heart attack Father    Cancer Brother        prostate cancer   Diabetes Maternal Aunt    Arthritis Brother    Asthma Brother    Stroke  Maternal Grandmother    Pulmonary embolism Daughter      Prior to Admission medications   Medication Sig Start Date End Date Taking? Authorizing Provider  acetaminophen (TYLENOL) 500 MG tablet Take 1,500 mg by mouth every 12 (twelve) hours as needed (for pain- with each dose of Tramadol).   Yes [provider]  allopurinol (ZYLOPRIM) 100 MG tablet TAKE 1 TABLET BY MOUTH EVERY DAY 01/11/23  Yes Tillman Abide I, MD  allopurinol (ZYLOPRIM) 300 MG tablet TAKE 1 TABLET BY MOUTH EVERY DAY 01/11/23  Yes Chelsea Schwalbe, MD  colchicine 0.6 MG tablet Take 1 tablet (0.6 mg total) by mouth 2 (two) times daily. 12/08/22  Yes Chelsea Schwalbe, MD  ELIQUIS 2.5 MG TABS tablet TAKE 1 TABLET TWICE A DAY 01/21/23  Yes Chelsea Schwalbe,  MD  fluconazole (DIFLUCAN) 150 MG tablet Take 1 tablet (150 mg total) by mouth once a week. 03/31/23  Yes Chelsea Schwalbe, MD  gabapentin (NEURONTIN) 300 MG capsule TAKE 1 CAPSULE IN THE MORNING AND 2 CAPSULES AT BEDTIME Patient taking differently: Take 300-600 mg by mouth See admin instructions. Take 300 mg by mouth in the morning and 600 mg with supper 07/27/22  Yes Tillman Abide I, MD  hydrOXYzine (ATARAX) 10 MG tablet TAKE 1 TABLET BY MOUTH THREE TIMES A DAY AS NEEDED 02/24/23  Yes Chelsea Schwalbe, MD  KLOR-CON M20 20 MEQ tablet TAKE 2 TABLETS (40 MEQ TOTAL) DAILY Patient taking differently: Take 40 mEq by mouth in the morning. 04/28/22  Yes Chelsea Schwalbe, MD  ondansetron (ZOFRAN) 4 MG tablet Take 1 tablet (4 mg total) by mouth every 8 (eight) hours as needed for nausea or vomiting. 03/15/23  Yes Chelsea Schwalbe, MD  torsemide (DEMADEX) 20 MG tablet Take 20 mg by mouth daily as needed (for fluid).   Yes [provider]  traMADol (ULTRAM) 50 MG tablet Take 1 tablet (50 mg total) by mouth 3 (three) times daily as needed for moderate pain. 03/19/23  Yes Chelsea Schwalbe, MD  triamterene-hydrochlorothiazide (MAXZIDE-25) 37.5-25 MG tablet TAKE 1 TABLET DAILY  03/25/23  Yes Chelsea Schwalbe, MD  famotidine (PEPCID) 40 MG tablet Take 1 tablet (40 mg total) by mouth at bedtime. Patient not taking: Reported on 04/03/2023 02/01/23   Chelsea Schwalbe, MD    Physical Exam: BP 114/61 (BP Location: Right Arm)   Pulse (!) 122   Temp 97.6 F (36.4 C)   Resp 20   Ht 5\' 3"  (1.6 m)   Wt 108 kg   SpO2 94%   BMI 42.18 kg/m   General: 88 y.o. year-old female well developed well nourished in no acute distress.  Alert and oriented x3. HEENT: NCAT, EOMI Neck: Supple, trachea medial Cardiovascular: Regular rate and rhythm with no rubs or gallops.  No thyromegaly or JVD noted.  No lower extremity edema. 2/4 pulses in all 4 extremities. Respiratory: Clear to auscultation with no wheezes or rales. Good inspiratory effort. Abdomen: Soft, tender to palpation of lower quadrants without guarding.  Nondistended with normal bowel sounds x4 quadrants. Muskuloskeletal: Tender to palpation of left thigh, restricted ROM in LLE due to pain.  Swelling and hematoma of right big toe. Neuro: CN II-XII intact, sensation, reflexes intact Skin: No ulcerative lesions noted or rashes Psychiatry: Judgement and insight appear normal. Mood is appropriate for condition and setting          Labs on Admission:  Basic Metabolic Panel: Recent Labs  Lab 04/03/23 1513  NA 136  K 4.1  CL 102  CO2 25  GLUCOSE 118*  BUN 27*  CREATININE 1.31*  CALCIUM 8.5*   Liver Function Tests: No results for input(s): "AST", "ALT", "ALKPHOS", "BILITOT", "PROT", "ALBUMIN" in the last 168 hours. No results for input(s): "LIPASE", "AMYLASE" in the last 168 hours. No results for input(s): "AMMONIA" in the last 168 hours. CBC: Recent Labs  Lab 04/03/23 1513  WBC 14.3*  NEUTROABS 12.2*  HGB 9.6*  HCT 31.9*  MCV 89.4  PLT 273   Cardiac Enzymes: No results for input(s): "CKTOTAL", "CKMB", "CKMBINDEX", "TROPONINI" in the last 168 hours.  BNP (last 3 results) Recent Labs    11/12/22 0705   BNP 121.3*    ProBNP (last 3 results) No results for input(s): "PROBNP" in the last 8760  hours.  CBG: No results for input(s): "GLUCAP" in the last 168 hours.  Radiological Exams on Admission: CT CHEST ABDOMEN PELVIS W CONTRAST  Result Date: 04/03/2023 CLINICAL DATA:  Patient fell. Poly trauma, blunt. Chronic anticoagulation. EXAM: CT CHEST, ABDOMEN, AND PELVIS WITH CONTRAST TECHNIQUE: Multidetector CT imaging of the chest, abdomen and pelvis was performed following the standard protocol during bolus administration of intravenous contrast. RADIATION DOSE REDUCTION: This exam was performed according to the departmental dose-optimization program which includes automated exposure control, adjustment of the mA and/or kV according to patient size and/or use of iterative reconstruction technique. CONTRAST:  OMNIPAQUE IOHEXOL 300 MG/ML  SOLN COMPARISON:  Abdomen and pelvis CT 03/17/2023 FINDINGS: CT CHEST FINDINGS Cardiovascular: The heart size is normal. No substantial pericardial effusion. Coronary artery calcification is evident. Moderate atherosclerotic calcification is noted in the wall of the thoracic aorta. Enlargement of the pulmonary outflow tract/main pulmonary arteries suggests pulmonary arterial hypertension. Mediastinum/Nodes: No mediastinal lymphadenopathy. There is no hilar lymphadenopathy. The esophagus has normal imaging features. There is no axillary lymphadenopathy. Lungs/Pleura: No suspicious pulmonary nodule or mass. No focal airspace consolidation. No pleural effusion. Musculoskeletal: No worrisome lytic or sclerotic osseous abnormality. Chronic posttraumatic/degenerative deformity noted right shoulder. No sternal fracture. No evidence for thoracic spine fracture. CT ABDOMEN PELVIS FINDINGS Hepatobiliary: No suspicious focal abnormality within the liver parenchyma. Gallbladder is surgically absent. No intrahepatic or extrahepatic biliary dilation. Pancreas: No focal mass lesion. No  dilatation of the main duct. No intraparenchymal cyst. No peripancreatic edema. Spleen: No splenomegaly. No suspicious focal mass lesion. Adrenals/Urinary Tract: Small left adrenal nodule stable since 04/11/2018 consistent with benign etiology such as adenoma. No followup imaging is recommended. Vascular calcification seen in the hilum of each kidney. No suspicious renal mass lesion. Small cyst noted lower pole left kidney. No followup imaging is recommended. No evidence for hydroureter. The urinary bladder appears normal for the degree of distention. Stomach/Bowel: Stomach is unremarkable. No gastric wall thickening. No evidence of outlet obstruction. Duodenum is normally positioned as is the ligament of Treitz. Portions of the small bowel and right colon have been excluded from the field of view. Within this limitation there is no small bowel dilatation to suggest obstruction. No colonic dilatation. Diverticular changes are noted in the left colon without evidence of diverticulitis. There is some subtle pericolonic edema/inflammation along the distal descending colon (axial image 71/series 4) suggesting diverticulitis Vascular/Lymphatic: There is moderate atherosclerotic calcification of the abdominal aorta without aneurysm. There is no gastrohepatic or hepatoduodenal ligament lymphadenopathy. No retroperitoneal or mesenteric lymphadenopathy. No pelvic sidewall lymphadenopathy. Reproductive: Hysterectomy.  There is no adnexal mass. Other: No intraperitoneal free fluid. Musculoskeletal: No worrisome lytic or sclerotic osseous abnormality. L4 superior endplate compression deformity is stable since 03/17/2023. IMPRESSION: 1. No evidence for acute traumatic injury in the chest, abdomen, or pelvis. 2. Subtle pericolonic edema/inflammation along the distal descending colon suggesting diverticulitis. To suggest perforation or abscess. 3. Enlargement of the pulmonary outflow tract/main pulmonary arteries suggests  pulmonary arterial hypertension. 4.  Aortic Atherosclerosis (ICD10-I70.0). No findings Electronically Signed   By: Kennith Center M.D.   On: 04/03/2023 17:30   CT HEAD WO CONTRAST ( )  Result Date: 04/03/2023 CLINICAL DATA:  Head trauma.  Fall.  Patient on blood thinner. EXAM: CT HEAD WITHOUT CONTRAST TECHNIQUE: Contiguous axial images were obtained from the base of the skull through the vertex without intravenous contrast. RADIATION DOSE REDUCTION: This exam was performed according to the departmental dose-optimization program which includes automated exposure control, adjustment  of the mA and/or kV according to patient size and/or use of iterative reconstruction technique. COMPARISON:  Head CT dated 07/17/2016. FINDINGS: Brain: Mild age-related atrophy and chronic microvascular ischemic changes. There is no acute intracranial hemorrhage. No mass effect or midline shift. No extra-axial fluid collection. Vascular: No hyperdense vessel or unexpected calcification. Skull: Normal. Negative for fracture or focal lesion. Sinuses/Orbits: No acute finding. Other: None IMPRESSION: 1. No acute intracranial pathology. 2. Mild age-related atrophy and chronic microvascular ischemic changes. Electronically Signed   By: Elgie Collard M.D.   On: 04/03/2023 17:27   CT CERVICAL SPINE WO CONTRAST  Result Date: 04/03/2023 CLINICAL DATA:  Fall.  Polytrauma, blunt EXAM: CT CERVICAL SPINE WITHOUT CONTRAST TECHNIQUE: Multidetector CT imaging of the cervical spine was performed without intravenous contrast. Multiplanar CT image reconstructions were also generated. RADIATION DOSE REDUCTION: This exam was performed according to the departmental dose-optimization program which includes automated exposure control, adjustment of the mA and/or kV according to patient size and/or use of iterative reconstruction technique. COMPARISON:  None Available. FINDINGS: Alignment: 3 mm degenerative anterolisthesis of C3 on C4. Skull base and  vertebrae: Diffuse osteopenia. No fracture or focal bone lesion. Soft tissues and spinal canal: No prevertebral fluid or swelling. No visible canal hematoma. Disc levels: Advanced diffuse degenerative facet disease bilaterally. Mild degenerative disc disease with disc space narrowing and anterior spurring. No visible disc herniation. Upper chest: No acute findings Other: None IMPRESSION: Moderate to advanced multilevel degenerative spondylosis. No acute bony abnormality. Electronically Signed   By: Charlett Nose M.D.   On: 04/03/2023 17:22   DG Knee 2 Views Left  Result Date: 04/03/2023 CLINICAL DATA:  Fall.  Pain. EXAM: LEFT KNEE - 1-2 VIEW COMPARISON:  None Available. FINDINGS: Status post tricompartmental knee replacement. Acute comminuted fracture of the distal femoral metaphysis identified at the level of the femoral component. There is approximately 1/2 shaft with the posterior displacement of the femoral component of the knee replacement relative to the metaphyseal shaft. IMPRESSION: Acute comminuted fracture of the distal femoral metaphysis at the level of the femoral component of the knee replacement. Electronically Signed   By: Kennith Center M.D.   On: 04/03/2023 15:17   DG FEMUR, MIN 2 VIEWS RIGHT  Result Date: 04/03/2023 CLINICAL DATA:  Fall with pain. EXAM: RIGHT FEMUR 2 VIEWS COMPARISON:  None Available. FINDINGS: Study limited by osteopenia and body habitus. No displaced fracture of the femoral neck evident. Degenerative changes are seen in all 3 compartments of the knee with small joint effusion. IMPRESSION: 1. Limited study due to osteopenia and body habitus. No displaced fracture evident. 2. Degenerative changes in the right knee with small joint effusion. Electronically Signed   By: Kennith Center M.D.   On: 04/03/2023 15:16   DG Hip Unilat W or Wo Pelvis 2-3 Views Left  Result Date: 04/03/2023 CLINICAL DATA:  Fall.  Left leg pain. EXAM: DG HIP (WITH OR WITHOUT PELVIS) 2-3V LEFT  COMPARISON:  04/03/2017 FINDINGS: Bones are diffusely demineralized. No evidence for pubic ramus or sacral fracture. AP and cross-table lateral views of the left hip show no evidence for a displaced left femoral neck fracture although assessment is limited by osteopenia and body habitus. IMPRESSION: No evidence for displaced left femoral neck fracture although assessment is limited by osteopenia and body habitus. If there is high clinical concern for occult fracture, CT recommended to further evaluate. Electronically Signed   By: Kennith Center M.D.   On: 04/03/2023 15:15   DG  Chest Portable 1 View  Result Date: 04/03/2023 CLINICAL DATA:  Fall.  Pain everywhere. EXAM: PORTABLE CHEST 1 VIEW COMPARISON:  11/12/2022 FINDINGS: The cardio pericardial silhouette is enlarged. Mild vascular congestion without edema. The lungs are clear without focal pneumonia, edema, pneumothorax or pleural effusion. No acute bony abnormality. IMPRESSION: Enlargement of the cardiopericardial silhouette with mild vascular congestion. Electronically Signed   By: Kennith Center M.D.   On: 04/03/2023 15:14    EKG: I independently viewed the EKG done and my findings are as followed: Normal sinus rhythm at a rate of 86 bpm. Paired Ventricular PVCs.  Assessment/Plan Present on Admission:  Closed fracture of distal end of femur, unspecified fracture morphology, initial encounter (HCC)  Acute diarrhea  Anemia of chronic disease  Essential hypertension  Chronic diastolic CHF (congestive heart failure) (HCC)  Principal Problem:   Closed fracture of distal end of femur, unspecified fracture morphology, initial encounter (HCC) Active Problems:   Essential hypertension   Chronic diastolic CHF (congestive heart failure) (HCC)   Acute diarrhea   Anemia of chronic disease   Acute diverticulitis   Leukocytosis   Chronic kidney disease, stage 3b (HCC)   Recurrent deep vein thrombosis (DVT) (HCC)  Acute comminuted fracture of the  distal femoral metaphysis Left knee x-ray showed fracture of distal femoral metaphysis Continue IV fentanyl as needed Continue fall precaution Orthopedist on-call (Dr. Hulda Humphrey) was consulted and recommended admitting patient to [facility] and that doctor Handy (orthopedics) will consult on patient in the morning  Acute diverticulitis CT chest, abdomen and pelvis was suggestive of diverticulitis Patient was started on IV Unasyn, we shall continue with same at this time with plan to de-escalate/discontinue based on blood culture, stool culture Continue IV hydration  Acute diarrhea C. difficile pending, GI stool panel pending Continue treatment as indicated for diverticulitis  Leukocytosis WBC 14.3, this is possibly secondary to diverticulitis vs reactive Continue management as described for diverticulitis  Anemia of chronic disease Hemoglobin 9.6, stable Continue to monitor CBC  Chronic kidney disease stage IIIb Creatinine at 1.3, baseline 1.0-1.1 Renally adjust medications, avoid nephrotoxic agents/dehydration/hypotension  History of recurrent DVTs Eliquis will be temporarily held at this time in anticipation for possible surgical intervention  Essential hypertension (controlled) Continue home meds  Chronic diastolic CHF Stable, continue meds   DVT prophylaxis: SCDs  Advance Care Planning: CODE STATUS: Full code  Consults: Orthopedic surgeon (by EDP)  Family Communication: Daughter at bedside (all questions answered to satisfaction)  Severity of Illness: The appropriate patient status for this patient is INPATIENT. Inpatient status is judged to be reasonable and necessary in order to provide the required intensity of service to ensure the patient's safety. The patient's presenting symptoms, physical exam findings, and initial radiographic and laboratory data in the context of their chronic comorbidities is felt to place them at high risk for further clinical deterioration.  Furthermore, it is not anticipated that the patient will be medically stable for discharge from the hospital within 2 midnights of admission.   * I certify that at the point of admission it is my clinical judgment that the patient will require inpatient hospital care spanning beyond 2 midnights from the point of admission due to high intensity of service, high risk for further deterioration and high frequency of surveillance required.*  Author: Frankey Shown, DO 04/03/2023 8:47 PM  For on call review [email or web address].

## 2023-04-03 NOTE — ED Notes (Signed)
Pt was transported by [facility]

## 2023-04-04 ENCOUNTER — Encounter: Payer: Self-pay | Admitting: Hematology and Oncology

## 2023-04-04 DIAGNOSIS — S72409A Unspecified fracture of lower end of unspecified femur, initial encounter for closed fracture: Secondary | ICD-10-CM | POA: Diagnosis not present

## 2023-04-04 LAB — CBC
HCT: 26.9 % — ABNORMAL LOW (ref 36.0–46.0)
Hemoglobin: 8.2 g/dL — ABNORMAL LOW (ref 12.0–15.0)
MCH: 27.6 pg (ref 26.0–34.0)
MCHC: 30.5 g/dL (ref 30.0–36.0)
MCV: 90.6 fL (ref 80.0–100.0)
Platelets: 303 10*3/uL (ref 150–400)
RBC: 2.97 MIL/uL — ABNORMAL LOW (ref 3.87–5.11)
RDW: 23.7 % — ABNORMAL HIGH (ref 11.5–15.5)
WBC: 15.3 10*3/uL — ABNORMAL HIGH (ref 4.0–10.5)
nRBC: 0.7 % — ABNORMAL HIGH (ref 0.0–0.2)

## 2023-04-04 LAB — COMPREHENSIVE METABOLIC PANEL
ALT: 45 U/L — ABNORMAL HIGH (ref 0–44)
AST: 63 U/L — ABNORMAL HIGH (ref 15–41)
Albumin: 2.4 g/dL — ABNORMAL LOW (ref 3.5–5.0)
Alkaline Phosphatase: 164 U/L — ABNORMAL HIGH (ref 38–126)
Anion gap: 8 (ref 5–15)
BUN: 26 mg/dL — ABNORMAL HIGH (ref 8–23)
CO2: 24 mmol/L (ref 22–32)
Calcium: 8.2 mg/dL — ABNORMAL LOW (ref 8.9–10.3)
Chloride: 102 mmol/L (ref 98–111)
Creatinine, Ser: 1.54 mg/dL — ABNORMAL HIGH (ref 0.44–1.00)
GFR, Estimated: 33 mL/min — ABNORMAL LOW (ref >60)
Glucose, Bld: 117 mg/dL — ABNORMAL HIGH (ref 70–99)
Potassium: 5.3 mmol/L — ABNORMAL HIGH (ref 3.5–5.1)
Sodium: 134 mmol/L — ABNORMAL LOW (ref 135–145)
Total Bilirubin: 2.4 mg/dL — ABNORMAL HIGH (ref 0.3–1.2)
Total Protein: 4.1 g/dL — ABNORMAL LOW (ref 6.5–8.1)

## 2023-04-04 LAB — PHOSPHORUS: Phosphorus: 5.3 mg/dL — ABNORMAL HIGH (ref 2.5–4.6)

## 2023-04-04 LAB — POTASSIUM: Potassium: 5.5 mmol/L — ABNORMAL HIGH (ref 3.5–5.1)

## 2023-04-04 LAB — MAGNESIUM: Magnesium: 1.4 mg/dL — ABNORMAL LOW (ref 1.7–2.4)

## 2023-04-04 MED ORDER — TRIAMTERENE-HCTZ 37.5-25 MG PO TABS
1.0000 | ORAL_TABLET | Freq: Every day | ORAL | Status: DC
  Administered 2023-04-04: 1 via ORAL
  Filled 2023-04-04 (×3): qty 1

## 2023-04-04 MED ORDER — SODIUM CHLORIDE 0.9 % IV SOLN: INTRAVENOUS | Status: AC

## 2023-04-04 MED ORDER — MAGNESIUM SULFATE 2 GM/50ML IV SOLN
2.0000 g | Freq: Once | INTRAVENOUS | Status: AC
  Administered 2023-04-04: 2 g via INTRAVENOUS
  Filled 2023-04-04: qty 50

## 2023-04-04 MED ORDER — FENTANYL CITRATE PF 50 MCG/ML IJ SOSY
50.0000 ug | PREFILLED_SYRINGE | Freq: Four times a day (QID) | INTRAMUSCULAR | Status: DC | PRN
  Administered 2023-04-04 – 2023-04-06 (×3): 50 ug via INTRAVENOUS
  Filled 2023-04-04 (×4): qty 1

## 2023-04-04 NOTE — Plan of Care (Signed)

## 2023-04-04 NOTE — Progress Notes (Addendum)
PROGRESS NOTE    Chelsea Keller  ZOX:[id number] DOB: 1934-09-18 DOA: 04/03/2023 PCP: [name], MD   Brief Narrative:  This 88 yrs old female with PMH significant for chronic diastolic CHF, essential hypertension, chronic blood loss anemia requiring blood transfusion, morbid obesity who presented in the ED due to fall sustained at home. Patient states she has lost her balance while coming out of the bathroom and heading to her bedroom due to her legs giving out on her. She had difficulty in being able to bear weight on her left leg.  Patient also complained of 4 to 5 weeks onset of abdominal pain mainly in the lower abdomen.  She has complained of constipation for 2 weeks, then has taken MiraLAX but now has developed non- bloody diarrhea.  Workup in the ED showed left knee x-ray shows acute comminuted fracture of distal femoral metaphysis at the level of femoral component of knee replacement.  CT chest, abdomen and pelvis showed no evidence of acute traumatic injury in the chest,  abdomen or pelvis.  This pericolonic inflammation along the descending colon suggesting diverticulitis.  CT head and neck no acute intracranial abnormality. Patient is admitted for further evaluation.  Orthopedics consulted.  Started on IV Unasyn.   Assessment & Plan:   Principal Problem:   Closed fracture of distal end of femur, unspecified fracture morphology, initial encounter (HCC) Active Problems:   Essential hypertension   Chronic diastolic CHF (congestive heart failure) (HCC)   Acute diarrhea   Anemia of chronic disease   Acute diverticulitis   Leukocytosis   Chronic kidney disease, stage 3b (HCC)   Recurrent deep vein thrombosis (DVT) (HCC)   Acute comminuted fracture of distal femoral metaphysis, Left: Left knee x-ray showed fracture of distal femoral metaphysis Continue IV fentanyl as needed Continue fall precautions Orthopedist on-call (Dr. Hulda Humphrey) was consulted ,  Dr. Carola Frost (orthopedics) will  consult on patient .   Acute diverticulitis: CT chest, abdomen and pelvis was suggestive of diverticulitis. Patient initiated on IV Unasyn.  Continue with Unasyn and de-escalate and discontinue based on cultures and stool cultures. Continue IV hydration and adequate pain control.   Acute diarrhea C. difficile pending, GI stool panel pending. Continue treatment as indicated for diverticulitis.   Leukocytosis WBC 14.3, this is possibly secondary to diverticulitis vs reactive. Continue antibiotics treatment for diverticulitis.   Anemia of chronic disease: Hemoglobin 9.6, stable. Continue to monitor CBC   Chronic kidney disease stage IIIb: Creatinine at 1.3, baseline 1.0-1.1 Renally adjust medications, Avoid nephrotoxic agents/dehydration/hypotension.   History of recurrent DVTs Eliquis will be temporarily held at this time in anticipation for possible surgical intervention.   Essential hypertension (controlled) Continue triamterene-HCTZ   Chronic diastolic CHF Stable, hold torsemide.    DVT prophylaxis:  SCDs Code Status: Full code Family Communication: No family at bed side Disposition Plan:    Status is: Inpatient Remains inpatient appropriate because: Admitted to s/p mechanical fall with left femoral fracture.  Also has diverticulitis started on IV Zosyn.   orthopedics is consulted   Consultants:  Orthopeadics  Procedures: Antimicrobials:  Anti-infectives (From admission, onward)    Start     Dose/Rate Route Frequency Ordered Stop   04/04/23 2200  Ampicillin-Sulbactam (UNASYN) 3 g in sodium chloride 0.9 % 100 mL IVPB        3 g 200 mL/hr over 30 Minutes Intravenous Every 8 hours 04/03/23 2025     04/03/23 1800  Ampicillin-Sulbactam (UNASYN) 3 g in sodium chloride 0.9 %  100 mL IVPB        3 g 200 mL/hr over 30 Minutes Intravenous  Once 04/03/23 1747 04/03/23 2100      Subjective: Patient was seen and examined at bedside.  Overnight events noted.   Patient  reports doing better,  She still reports having pain in the lower abdomen.   She reports pain in the left thigh.  Objective: Vitals:   04/03/23 2014 04/04/23 0015 04/04/23 0424 04/04/23 0858  BP: 114/61 (!) 141/81 120/67 (!) 143/65  Pulse: (!) 122 (!) 127 98   Resp: 20 18 18 18   Temp: 97.6 F (36.4 C) 98 F (36.7 C) 97.8 F (36.6 C) 97.8 F (36.6 C)  TempSrc: Oral Oral Oral Oral  SpO2: 94% 96% 100% 97%  Weight:      Height:        Intake/Output Summary (Last 24 hours) at 04/04/2023 1053 Last data filed at 04/03/2023 2031 Gross per 24 hour  Intake 0 ml  Output --  Net 0 ml   Filed Weights   04/03/23 1339  Weight: 108 kg    Examination:  General exam: Appears calm and comfortable, deconditioned, not in any acute distress. Respiratory system: CTA bilaterally. Respiratory effort normal. RR 15 Cardiovascular system: S1 & S2 heard, RRR. No murmer. No pedal edema. Gastrointestinal system: Abdomen is non distended, soft and non tender.  Normal bowel sounds heard. Central nervous system: Alert and oriented x 3. No focal neurological deficits. Extremities: No edema, no cyanosis, no clubbing  Skin: No rashes, lesions or ulcers Psychiatry: Judgement and insight appear normal. Mood & affect appropriate.     Data Reviewed: I have personally reviewed following labs and imaging studies  CBC: Recent Labs  Lab 04/03/23 1513 04/04/23 0431  WBC 14.3* 15.3*  NEUTROABS 12.2*  --   HGB 9.6* 8.2*  HCT 31.9* 26.9*  MCV 89.4 90.6  PLT 273 303   Basic Metabolic Panel: Recent Labs  Lab 04/03/23 1513 04/04/23 0431  NA 136 134*  K 4.1 5.3*  CL 102 102  CO2 25 24  GLUCOSE 118* 117*  BUN 27* 26*  CREATININE 1.31* 1.54*  CALCIUM 8.5* 8.2*  MG  --  1.4*  PHOS  --  5.3*   GFR: Estimated Creatinine Clearance: 30.9 mL/min (A) (by C-G formula based on SCr of 1.54 mg/dL (H)). Liver Function Tests: Recent Labs  Lab 04/04/23 0431  AST 63*  ALT 45*  ALKPHOS 164*  BILITOT 2.4*   PROT 4.1*  ALBUMIN 2.4*   No results for input(s): "LIPASE", "AMYLASE" in the last 168 hours. No results for input(s): "AMMONIA" in the last 168 hours. Coagulation Profile: No results for input(s): "INR", "PROTIME" in the last 168 hours. Cardiac Enzymes: No results for input(s): "CKTOTAL", "CKMB", "CKMBINDEX", "TROPONINI" in the last 168 hours. BNP (last 3 results) No results for input(s): "PROBNP" in the last 8760 hours. HbA1C: No results for input(s): "HGBA1C" in the last 72 hours. CBG: No results for input(s): "GLUCAP" in the last 168 hours. Lipid Profile: No results for input(s): "CHOL", "HDL", "LDLCALC", "TRIG", "CHOLHDL", "LDLDIRECT" in the last 72 hours. Thyroid Function Tests: No results for input(s): "TSH", "T4TOTAL", "FREET4", "T3FREE", "THYROIDAB" in the last 72 hours. Anemia Panel: No results for input(s): "VITAMINB12", "FOLATE", "FERRITIN", "TIBC", "IRON", "RETICCTPCT" in the last 72 hours. Sepsis Labs: No results for input(s): "PROCALCITON", "LATICACIDVEN" in the last 168 hours.  Recent Results (from the past 240 hour(s))  Blood culture (routine x 2)  Status: None (Preliminary result)   Collection Time: 04/03/23  6:22 PM   Specimen: BLOOD  Result Value Ref Range Status   Specimen Description   Final    BLOOD LEFT ANTECUBITAL Performed at [facility], [address] [place], [place] [address]    Special Requests   Final    BOTTLES DRAWN AEROBIC AND ANAEROBIC Blood Culture adequate volume Performed at [facility], [address] [place], [place] [address]    Culture   Final    NO GROWTH < 12 HOURS Performed at [facility], [address] [place], [place] [address]    Report Status PENDING  Incomplete  Blood culture (routine x 2)     Status: None (Preliminary result)   Collection Time: 04/03/23  6:37 PM   Specimen: BLOOD  Result Value Ref Range Status   Specimen Description   Final    BLOOD RIGHT  ANTECUBITAL Performed at [facility], [address] [place], [place] [address]    Special Requests   Final    BOTTLES DRAWN AEROBIC AND ANAEROBIC Blood Culture adequate volume Performed at [facility], [address], [place], [place] [address]    Culture   Final    NO GROWTH < 12 HOURS Performed at [facility], [address], [place], [place] [address]    Report Status PENDING  Incomplete    Radiology Studies: DG Foot 2 Views Right  Result Date: 04/03/2023 CLINICAL DATA:  Fall, right leg pain, foot pain EXAM: RIGHT FOOT - 2 VIEW COMPARISON:  None Available. FINDINGS: No acute bony abnormality. Specifically, no fracture, subluxation, or dislocation. Diffuse osteopenia. Vascular and soft tissue calcifications. IMPRESSION: No acute bony abnormality. Electronically Signed   By: Charlett Nose M.D.   On: 04/03/2023 20:56   CT CHEST ABDOMEN PELVIS W CONTRAST  Result Date: 04/03/2023 CLINICAL DATA:  Patient fell. Poly trauma, blunt. Chronic anticoagulation. EXAM: CT CHEST, ABDOMEN, AND PELVIS WITH CONTRAST TECHNIQUE: Multidetector CT imaging of the chest, abdomen and pelvis was performed following the standard protocol during bolus administration of intravenous contrast. RADIATION DOSE REDUCTION: This exam was performed according to the departmental dose-optimization program which includes automated exposure control, adjustment of the mA and/or kV according to patient size and/or use of iterative reconstruction technique. CONTRAST:  OMNIPAQUE IOHEXOL 300 MG/ML  SOLN COMPARISON:  Abdomen and pelvis CT 03/17/2023 FINDINGS: CT CHEST FINDINGS Cardiovascular: The heart size is normal. No substantial pericardial effusion. Coronary artery calcification is evident. Moderate atherosclerotic calcification is noted in the wall of the thoracic aorta. Enlargement of the pulmonary outflow tract/main pulmonary arteries suggests pulmonary arterial hypertension.  Mediastinum/Nodes: No mediastinal lymphadenopathy. There is no hilar lymphadenopathy. The esophagus has normal imaging features. There is no axillary lymphadenopathy. Lungs/Pleura: No suspicious pulmonary nodule or mass. No focal airspace consolidation. No pleural effusion. Musculoskeletal: No worrisome lytic or sclerotic osseous abnormality. Chronic posttraumatic/degenerative deformity noted right shoulder. No sternal fracture. No evidence for thoracic spine fracture. CT ABDOMEN PELVIS FINDINGS Hepatobiliary: No suspicious focal abnormality within the liver parenchyma. Gallbladder is surgically absent. No intrahepatic or extrahepatic biliary dilation. Pancreas: No focal mass lesion. No dilatation of the main duct. No intraparenchymal cyst. No peripancreatic edema. Spleen: No splenomegaly. No suspicious focal mass lesion. Adrenals/Urinary Tract: Small left adrenal nodule stable since 04/11/2018 consistent with benign etiology such as adenoma. No followup imaging is recommended. Vascular calcification seen in the hilum of each kidney. No suspicious renal mass  lesion. Small cyst noted lower pole left kidney. No followup imaging is recommended. No evidence for hydroureter. The urinary bladder appears normal for the degree of distention. Stomach/Bowel: Stomach is unremarkable. No gastric wall thickening. No evidence of outlet obstruction. Duodenum is normally positioned as is the ligament of Treitz. Portions of the small bowel and right colon have been excluded from the field of view. Within this limitation there is no small bowel dilatation to suggest obstruction. No colonic dilatation. Diverticular changes are noted in the left colon without evidence of diverticulitis. There is some subtle pericolonic edema/inflammation along the distal descending colon (axial image 71/series 4) suggesting diverticulitis Vascular/Lymphatic: There is moderate atherosclerotic calcification of the abdominal aorta without aneurysm. There  is no gastrohepatic or hepatoduodenal ligament lymphadenopathy. No retroperitoneal or mesenteric lymphadenopathy. No pelvic sidewall lymphadenopathy. Reproductive: Hysterectomy.  There is no adnexal mass. Other: No intraperitoneal free fluid. Musculoskeletal: No worrisome lytic or sclerotic osseous abnormality. L4 superior endplate compression deformity is stable since 03/17/2023. IMPRESSION: 1. No evidence for acute traumatic injury in the chest, abdomen, or pelvis. 2. Subtle pericolonic edema/inflammation along the distal descending colon suggesting diverticulitis. To suggest perforation or abscess. 3. Enlargement of the pulmonary outflow tract/main pulmonary arteries suggests pulmonary arterial hypertension. 4.  Aortic Atherosclerosis (ICD10-I70.0). No findings Electronically Signed   By: Kennith Center M.D.   On: 04/03/2023 17:30   CT HEAD WO CONTRAST ( )  Result Date: 04/03/2023 CLINICAL DATA:  Head trauma.  Fall.  Patient on blood thinner. EXAM: CT HEAD WITHOUT CONTRAST TECHNIQUE: Contiguous axial images were obtained from the base of the skull through the vertex without intravenous contrast. RADIATION DOSE REDUCTION: This exam was performed according to the departmental dose-optimization program which includes automated exposure control, adjustment of the mA and/or kV according to patient size and/or use of iterative reconstruction technique. COMPARISON:  Head CT dated 07/17/2016. FINDINGS: Brain: Mild age-related atrophy and chronic microvascular ischemic changes. There is no acute intracranial hemorrhage. No mass effect or midline shift. No extra-axial fluid collection. Vascular: No hyperdense vessel or unexpected calcification. Skull: Normal. Negative for fracture or focal lesion. Sinuses/Orbits: No acute finding. Other: None IMPRESSION: 1. No acute intracranial pathology. 2. Mild age-related atrophy and chronic microvascular ischemic changes. Electronically Signed   By: Elgie Collard M.D.   On:  04/03/2023 17:27   CT CERVICAL SPINE WO CONTRAST  Result Date: 04/03/2023 CLINICAL DATA:  Fall.  Polytrauma, blunt EXAM: CT CERVICAL SPINE WITHOUT CONTRAST TECHNIQUE: Multidetector CT imaging of the cervical spine was performed without intravenous contrast. Multiplanar CT image reconstructions were also generated. RADIATION DOSE REDUCTION: This exam was performed according to the departmental dose-optimization program which includes automated exposure control, adjustment of the mA and/or kV according to patient size and/or use of iterative reconstruction technique. COMPARISON:  None Available. FINDINGS: Alignment: 3 mm degenerative anterolisthesis of C3 on C4. Skull base and vertebrae: Diffuse osteopenia. No fracture or focal bone lesion. Soft tissues and spinal canal: No prevertebral fluid or swelling. No visible canal hematoma. Disc levels: Advanced diffuse degenerative facet disease bilaterally. Mild degenerative disc disease with disc space narrowing and anterior spurring. No visible disc herniation. Upper chest: No acute findings Other: None IMPRESSION: Moderate to advanced multilevel degenerative spondylosis. No acute bony abnormality. Electronically Signed   By: Charlett Nose M.D.   On: 04/03/2023 17:22   DG Knee 2 Views Left  Result Date: 04/03/2023 CLINICAL DATA:  Fall.  Pain. EXAM: LEFT KNEE - 1-2 VIEW COMPARISON:  None Available. FINDINGS: Status post  tricompartmental knee replacement. Acute comminuted fracture of the distal femoral metaphysis identified at the level of the femoral component. There is approximately 1/2 shaft with the posterior displacement of the femoral component of the knee replacement relative to the metaphyseal shaft. IMPRESSION: Acute comminuted fracture of the distal femoral metaphysis at the level of the femoral component of the knee replacement. Electronically Signed   By: Kennith Center M.D.   On: 04/03/2023 15:17   DG FEMUR, MIN 2 VIEWS RIGHT  Result Date:  04/03/2023 CLINICAL DATA:  Fall with pain. EXAM: RIGHT FEMUR 2 VIEWS COMPARISON:  None Available. FINDINGS: Study limited by osteopenia and body habitus. No displaced fracture of the femoral neck evident. Degenerative changes are seen in all 3 compartments of the knee with small joint effusion. IMPRESSION: 1. Limited study due to osteopenia and body habitus. No displaced fracture evident. 2. Degenerative changes in the right knee with small joint effusion. Electronically Signed   By: Kennith Center M.D.   On: 04/03/2023 15:16   DG Hip Unilat W or Wo Pelvis 2-3 Views Left  Result Date: 04/03/2023 CLINICAL DATA:  Fall.  Left leg pain. EXAM: DG HIP (WITH OR WITHOUT PELVIS) 2-3V LEFT COMPARISON:  04/03/2017 FINDINGS: Bones are diffusely demineralized. No evidence for pubic ramus or sacral fracture. AP and cross-table lateral views of the left hip show no evidence for a displaced left femoral neck fracture although assessment is limited by osteopenia and body habitus. IMPRESSION: No evidence for displaced left femoral neck fracture although assessment is limited by osteopenia and body habitus. If there is high clinical concern for occult fracture, CT recommended to further evaluate. Electronically Signed   By: Kennith Center M.D.   On: 04/03/2023 15:15   DG Chest Portable 1 View  Result Date: 04/03/2023 CLINICAL DATA:  Fall.  Pain everywhere. EXAM: PORTABLE CHEST 1 VIEW COMPARISON:  11/12/2022 FINDINGS: The cardio pericardial silhouette is enlarged. Mild vascular congestion without edema. The lungs are clear without focal pneumonia, edema, pneumothorax or pleural effusion. No acute bony abnormality. IMPRESSION: Enlargement of the cardiopericardial silhouette with mild vascular congestion. Electronically Signed   By: Kennith Center M.D.   On: 04/03/2023 15:14    Scheduled Meds:  triamterene-hydrochlorothiazide  1 tablet Oral Daily   Continuous Infusions:  sodium chloride     ampicillin-sulbactam (UNASYN) IV        LOS: 1 day    Time spent: 50 mins    Willeen Niece, MD [facility]   If 7PM-7AM, please contact night-coverage

## 2023-04-04 NOTE — Plan of Care (Signed)

## 2023-04-05 ENCOUNTER — Inpatient Hospital Stay (HOSPITAL_COMMUNITY): Payer: Medicare Other | Admitting: Anesthesiology

## 2023-04-05 ENCOUNTER — Other Ambulatory Visit: Payer: Self-pay

## 2023-04-05 ENCOUNTER — Encounter (HOSPITAL_COMMUNITY): Payer: Self-pay | Admitting: Internal Medicine

## 2023-04-05 ENCOUNTER — Encounter (HOSPITAL_COMMUNITY)
Admission: EM | Disposition: A | Discharge status: Skilled Nursing Facility | Payer: Self-pay | Source: Home / Self Care | Attending: Internal Medicine

## 2023-04-05 ENCOUNTER — Inpatient Hospital Stay (HOSPITAL_COMMUNITY): Payer: Medicare Other

## 2023-04-05 DIAGNOSIS — S72409A Unspecified fracture of lower end of unspecified femur, initial encounter for closed fracture: Secondary | ICD-10-CM

## 2023-04-05 DIAGNOSIS — S72402A Unspecified fracture of lower end of left femur, initial encounter for closed fracture: Secondary | ICD-10-CM | POA: Diagnosis not present

## 2023-04-05 HISTORY — PX: ORIF FEMUR FRACTURE: SHX2119

## 2023-04-05 LAB — BASIC METABOLIC PANEL
Anion gap: 9 (ref 5–15)
BUN: 34 mg/dL — ABNORMAL HIGH (ref 8–23)
CO2: 24 mmol/L (ref 22–32)
Calcium: 8.1 mg/dL — ABNORMAL LOW (ref 8.9–10.3)
Chloride: 99 mmol/L (ref 98–111)
Creatinine, Ser: 1.49 mg/dL — ABNORMAL HIGH (ref 0.44–1.00)
GFR, Estimated: 34 mL/min — ABNORMAL LOW (ref >60)
Glucose, Bld: 107 mg/dL — ABNORMAL HIGH (ref 70–99)
Potassium: 4.6 mmol/L (ref 3.5–5.1)
Sodium: 132 mmol/L — ABNORMAL LOW (ref 135–145)

## 2023-04-05 LAB — CBC
HCT: 27.2 % — ABNORMAL LOW (ref 36.0–46.0)
Hemoglobin: 7.9 g/dL — ABNORMAL LOW (ref 12.0–15.0)
MCH: 28 pg (ref 26.0–34.0)
MCHC: 29 g/dL — ABNORMAL LOW (ref 30.0–36.0)
MCV: 96.5 fL (ref 80.0–100.0)
Platelets: 235 10*3/uL (ref 150–400)
RBC: 2.82 MIL/uL — ABNORMAL LOW (ref 3.87–5.11)
RDW: 25.2 % — ABNORMAL HIGH (ref 11.5–15.5)
WBC: 21.3 10*3/uL — ABNORMAL HIGH (ref 4.0–10.5)
nRBC: 0.7 % — ABNORMAL HIGH (ref 0.0–0.2)

## 2023-04-05 LAB — SURGICAL PCR SCREEN
MRSA, PCR: NEGATIVE
Staphylococcus aureus: NEGATIVE

## 2023-04-05 LAB — MAGNESIUM: Magnesium: 2 mg/dL (ref 1.7–2.4)

## 2023-04-05 LAB — PHOSPHORUS: Phosphorus: 4 mg/dL (ref 2.5–4.6)

## 2023-04-05 SURGERY — OPEN REDUCTION INTERNAL FIXATION (ORIF) DISTAL FEMUR FRACTURE
Anesthesia: General | Site: Knee | Laterality: Left

## 2023-04-05 MED ORDER — FENTANYL CITRATE (PF) 100 MCG/2ML IJ SOLN
INTRAMUSCULAR | Status: AC
  Filled 2023-04-05: qty 2

## 2023-04-05 MED ORDER — ONDANSETRON HCL 4 MG/2ML IJ SOLN
INTRAMUSCULAR | Status: AC
  Filled 2023-04-05: qty 2

## 2023-04-05 MED ORDER — CHLORHEXIDINE GLUCONATE 0.12 % MT SOLN: 15.0000 mL | Freq: Once | OROMUCOSAL | Status: AC

## 2023-04-05 MED ORDER — VANCOMYCIN HCL 1000 MG IV SOLR
INTRAVENOUS | Status: DC | PRN
  Administered 2023-04-05: 1000 mg via TOPICAL

## 2023-04-05 MED ORDER — LABETALOL HCL 5 MG/ML IV SOLN
INTRAVENOUS | Status: AC
  Filled 2023-04-05: qty 4

## 2023-04-05 MED ORDER — ONDANSETRON HCL 4 MG/2ML IJ SOLN: 4.0000 mg | Freq: Once | INTRAMUSCULAR | Status: DC | PRN

## 2023-04-05 MED ORDER — CHLORHEXIDINE GLUCONATE 4 % EX SOLN: 60.0000 mL | Freq: Once | CUTANEOUS | Status: DC

## 2023-04-05 MED ORDER — ACETAMINOPHEN 325 MG PO TABS: 650.0000 mg | ORAL_TABLET | Freq: Four times a day (QID) | ORAL | Status: DC | PRN

## 2023-04-05 MED ORDER — CHLORHEXIDINE GLUCONATE 0.12 % MT SOLN
OROMUCOSAL | Status: AC
  Administered 2023-04-05: 15 mL via OROMUCOSAL
  Filled 2023-04-05: qty 15

## 2023-04-05 MED ORDER — VANCOMYCIN HCL 1000 MG IV SOLR
INTRAVENOUS | Status: AC
  Filled 2023-04-05: qty 20

## 2023-04-05 MED ORDER — PROPOFOL 10 MG/ML IV BOLUS
INTRAVENOUS | Status: DC | PRN
Start: 2023-04-05 — End: 2023-04-05
  Administered 2023-04-05: 100 mg via INTRAVENOUS

## 2023-04-05 MED ORDER — FENTANYL CITRATE (PF) 250 MCG/5ML IJ SOLN
INTRAMUSCULAR | Status: DC | PRN
  Administered 2023-04-05: 50 ug via INTRAVENOUS
  Administered 2023-04-05: 100 ug via INTRAVENOUS
  Administered 2023-04-05: 50 ug via INTRAVENOUS

## 2023-04-05 MED ORDER — ONDANSETRON HCL 4 MG/2ML IJ SOLN
INTRAMUSCULAR | Status: DC | PRN
  Administered 2023-04-05: 4 mg via INTRAVENOUS

## 2023-04-05 MED ORDER — CEFAZOLIN SODIUM 1 G IJ SOLR
INTRAMUSCULAR | Status: AC
  Filled 2023-04-05: qty 10

## 2023-04-05 MED ORDER — ROCURONIUM BROMIDE 10 MG/ML (PF) SYRINGE
PREFILLED_SYRINGE | INTRAVENOUS | Status: AC
  Filled 2023-04-05: qty 10

## 2023-04-05 MED ORDER — OXYCODONE HCL 5 MG/5ML PO SOLN: 5.0000 mg | Freq: Once | ORAL | Status: DC | PRN

## 2023-04-05 MED ORDER — LIDOCAINE 2% (20 MG/ML) 5 ML SYRINGE
INTRAMUSCULAR | Status: DC | PRN
  Administered 2023-04-05: 60 mg via INTRAVENOUS

## 2023-04-05 MED ORDER — SUGAMMADEX SODIUM 200 MG/2ML IV SOLN
INTRAVENOUS | Status: DC | PRN
  Administered 2023-04-05: 200 mg via INTRAVENOUS

## 2023-04-05 MED ORDER — LABETALOL HCL 5 MG/ML IV SOLN
INTRAVENOUS | Status: DC | PRN
Start: 2023-04-05 — End: 2023-04-05
  Administered 2023-04-05: 5 mg via INTRAVENOUS

## 2023-04-05 MED ORDER — FENTANYL CITRATE (PF) 250 MCG/5ML IJ SOLN
INTRAMUSCULAR | Status: AC
  Filled 2023-04-05: qty 5

## 2023-04-05 MED ORDER — PHENYLEPHRINE 80 MCG/ML (10ML) SYRINGE FOR IV PUSH (FOR BLOOD PRESSURE SUPPORT)
PREFILLED_SYRINGE | INTRAVENOUS | Status: DC | PRN
  Administered 2023-04-05: 80 ug via INTRAVENOUS
  Administered 2023-04-05: 240 ug via INTRAVENOUS
  Administered 2023-04-05: 160 ug via INTRAVENOUS

## 2023-04-05 MED ORDER — CEFAZOLIN SODIUM-DEXTROSE 1-4 GM/50ML-% IV SOLN
INTRAVENOUS | Status: DC | PRN
Start: 2023-04-05 — End: 2023-04-05
  Administered 2023-04-05: 1 g via INTRAVENOUS

## 2023-04-05 MED ORDER — DEXAMETHASONE SODIUM PHOSPHATE 10 MG/ML IJ SOLN
INTRAMUSCULAR | Status: AC
  Filled 2023-04-05: qty 1

## 2023-04-05 MED ORDER — DEXAMETHASONE SODIUM PHOSPHATE 10 MG/ML IJ SOLN
INTRAMUSCULAR | Status: DC | PRN
  Administered 2023-04-05: 5 mg via INTRAVENOUS

## 2023-04-05 MED ORDER — 0.9 % SODIUM CHLORIDE (POUR BTL) OPTIME
TOPICAL | Status: DC | PRN
  Administered 2023-04-05: 1000 mL

## 2023-04-05 MED ORDER — CAMPHOR-MENTHOL 0.5-0.5 % EX LOTN
TOPICAL_LOTION | CUTANEOUS | Status: DC | PRN
  Filled 2023-04-05: qty 222

## 2023-04-05 MED ORDER — ROCURONIUM BROMIDE 10 MG/ML (PF) SYRINGE
PREFILLED_SYRINGE | INTRAVENOUS | Status: DC | PRN
  Administered 2023-04-05: 60 mg via INTRAVENOUS

## 2023-04-05 MED ORDER — CEFAZOLIN SODIUM-DEXTROSE 2-4 GM/100ML-% IV SOLN
INTRAVENOUS | Status: AC
  Filled 2023-04-05: qty 100

## 2023-04-05 MED ORDER — FENTANYL CITRATE (PF) 100 MCG/2ML IJ SOLN
25.0000 ug | INTRAMUSCULAR | Status: DC | PRN
  Administered 2023-04-05 (×2): 50 ug via INTRAVENOUS

## 2023-04-05 MED ORDER — MELATONIN 5 MG PO TABS
5.0000 mg | ORAL_TABLET | Freq: Every evening | ORAL | Status: DC | PRN
  Administered 2023-04-05: 5 mg via ORAL
  Filled 2023-04-05: qty 1

## 2023-04-05 MED ORDER — TRANEXAMIC ACID-NACL 1000-0.7 MG/100ML-% IV SOLN
1000.0000 mg | INTRAVENOUS | Status: AC
  Administered 2023-04-05: 1000 mg via INTRAVENOUS

## 2023-04-05 MED ORDER — LACTATED RINGERS IV SOLN: INTRAVENOUS | Status: DC

## 2023-04-05 MED ORDER — ORAL CARE MOUTH RINSE: 15.0000 mL | Freq: Once | OROMUCOSAL | Status: AC

## 2023-04-05 MED ORDER — HYDROXYZINE HCL 10 MG PO TABS
10.0000 mg | ORAL_TABLET | Freq: Once | ORAL | Status: AC
  Administered 2023-04-05: 10 mg via ORAL

## 2023-04-05 MED ORDER — CEFAZOLIN SODIUM-DEXTROSE 2-4 GM/100ML-% IV SOLN: 2.0000 g | INTRAVENOUS | Status: DC

## 2023-04-05 MED ORDER — TRANEXAMIC ACID-NACL 1000-0.7 MG/100ML-% IV SOLN
INTRAVENOUS | Status: AC
  Filled 2023-04-05: qty 100

## 2023-04-05 MED ORDER — OXYCODONE HCL 5 MG PO TABS: 5.0000 mg | ORAL_TABLET | Freq: Once | ORAL | Status: DC | PRN

## 2023-04-05 MED ORDER — POVIDONE-IODINE 10 % EX SWAB
2.0000 | Freq: Once | CUTANEOUS | Status: AC
  Administered 2023-04-05: 2 via TOPICAL

## 2023-04-05 SURGICAL SUPPLY — 77 items
ADH SKN CLS APL DERMABOND .7 (GAUZE/BANDAGES/DRESSINGS) ×1
APL PRP STRL LF DISP 70% ISPRP (MISCELLANEOUS) ×2
BAG COUNTER SPONGE SURGICOUNT (BAG) ×2 IMPLANT
BAG SPNG CNTER NS LX DISP (BAG) ×1
BIT DRILL 4.3 (BIT) ×1
BIT DRILL 4.3X300MM (BIT) IMPLANT
BIT DRILL LONG 3.3 (BIT) IMPLANT
BIT DRILL QC 3.3X195 (BIT) IMPLANT
BLADE CLIPPER SURG (BLADE) IMPLANT
BNDG CMPR 5X4 KNIT ELC UNQ LF (GAUZE/BANDAGES/DRESSINGS) ×1
BNDG CMPR 5X6 CHSV STRCH STRL (GAUZE/BANDAGES/DRESSINGS)
BNDG CMPR 6 X 5 YARDS HK CLSR (GAUZE/BANDAGES/DRESSINGS) ×1
BNDG CMPR MED 10X6 ELC LF (GAUZE/BANDAGES/DRESSINGS)
BNDG COHESIVE 6X5 TAN ST LF (GAUZE/BANDAGES/DRESSINGS) ×2 IMPLANT
BNDG ELASTIC 4INX 5YD STR LF (GAUZE/BANDAGES/DRESSINGS) IMPLANT
BNDG ELASTIC 6INX 5YD STR LF (GAUZE/BANDAGES/DRESSINGS) IMPLANT
BNDG ELASTIC 6X10 VLCR STRL LF (GAUZE/BANDAGES/DRESSINGS) ×2 IMPLANT
BRUSH SCRUB EZ PLAIN DRY (MISCELLANEOUS) ×4 IMPLANT
CANISTER SUCT 3000ML PPV (MISCELLANEOUS) ×2 IMPLANT
CAP LOCK NCB (Cap) IMPLANT
CHLORAPREP W/TINT 26 (MISCELLANEOUS) ×2 IMPLANT
COVER SURGICAL LIGHT HANDLE (MISCELLANEOUS) ×2 IMPLANT
DERMABOND ADVANCED .7 DNX12 (GAUZE/BANDAGES/DRESSINGS) IMPLANT
DRAPE C-ARM 42X72 X-RAY (DRAPES) ×2 IMPLANT
DRAPE C-ARMOR (DRAPES) ×2 IMPLANT
DRAPE HALF SHEET 40X57 (DRAPES) ×4 IMPLANT
DRAPE ORTHO SPLIT 77X108 STRL (DRAPES) ×2
DRAPE SURG 17X23 STRL (DRAPES) ×2 IMPLANT
DRAPE SURG ORHT 6 SPLT 77X108 (DRAPES) ×4 IMPLANT
DRAPE U-SHAPE 47X51 STRL (DRAPES) ×2 IMPLANT
DRESSING MEPILEX FLEX 4X4 (GAUZE/BANDAGES/DRESSINGS) IMPLANT
DRSG ADAPTIC 3X8 NADH LF (GAUZE/BANDAGES/DRESSINGS) IMPLANT
DRSG MEPILEX FLEX 4X4 (GAUZE/BANDAGES/DRESSINGS)
DRSG MEPILEX POST OP 4X12 (GAUZE/BANDAGES/DRESSINGS) IMPLANT
DRSG MEPILEX POST OP 4X8 (GAUZE/BANDAGES/DRESSINGS) IMPLANT
ELECT REM PT RETURN 9FT ADLT (ELECTROSURGICAL) ×1
ELECTRODE REM PT RTRN 9FT ADLT (ELECTROSURGICAL) ×2 IMPLANT
GAUZE PAD ABD 8X10 STRL (GAUZE/BANDAGES/DRESSINGS) ×6 IMPLANT
GAUZE SPONGE 4X4 12PLY STRL (GAUZE/BANDAGES/DRESSINGS) ×2 IMPLANT
GLOVE BIO SURGEON STRL SZ 6.5 (GLOVE) ×6 IMPLANT
GLOVE BIO SURGEON STRL SZ7.5 (GLOVE) ×8 IMPLANT
GLOVE BIOGEL PI IND STRL 6.5 (GLOVE) ×2 IMPLANT
GLOVE BIOGEL PI IND STRL 7.5 (GLOVE) ×2 IMPLANT
GOWN STRL REUS W/ TWL LRG LVL3 (GOWN DISPOSABLE) ×6 IMPLANT
GOWN STRL REUS W/TWL LRG LVL3 (GOWN DISPOSABLE) ×3
K-WIRE 2.0 (WIRE) ×1
K-WIRE FXSTD 280X2XNS SS (WIRE) ×1
KIT BASIN OR (CUSTOM PROCEDURE TRAY) ×2 IMPLANT
KIT TURNOVER KIT B (KITS) ×2 IMPLANT
KWIRE FXSTD 280X2XNS SS (WIRE) IMPLANT
NS IRRIG 1000ML POUR BTL (IV SOLUTION) ×2 IMPLANT
PACK TOTAL JOINT (CUSTOM PROCEDURE TRAY) ×2 IMPLANT
PAD ARMBOARD 7.5X6 YLW CONV (MISCELLANEOUS) ×4 IMPLANT
PAD CAST 4YDX4 CTTN HI CHSV (CAST SUPPLIES) ×2 IMPLANT
PADDING CAST COTTON 4X4 STRL (CAST SUPPLIES) ×1
PADDING CAST COTTON 6X4 STRL (CAST SUPPLIES) ×2 IMPLANT
PLATE DIST FEM 12H (Plate) IMPLANT
SCREW 5.0 80MM (Screw) IMPLANT
SCREW CORTICAL NCB 5.0X40 (Screw) ×3 IMPLANT
SCREW NCB 3.5X75X5X6.2XST (Screw) IMPLANT
SCREW NCB 4.0X40MM (Screw) IMPLANT
SCREW NCB 5.0X75MM (Screw) ×2 IMPLANT
SPONGE T-LAP 18X18 ~~LOC~~+RFID (SPONGE) IMPLANT
STAPLER VISISTAT 35W (STAPLE) ×2 IMPLANT
SUCTION TUBE FRAZIER 10FR DISP (SUCTIONS) ×2 IMPLANT
SUT ETHILON 3 0 PS 1 (SUTURE) ×4 IMPLANT
SUT MNCRL AB 3-0 PS2 18 (SUTURE) IMPLANT
SUT MON AB 2-0 CT1 36 (SUTURE) IMPLANT
SUT VIC AB 0 CT1 27 (SUTURE) ×1
SUT VIC AB 0 CT1 27XBRD ANBCTR (SUTURE) IMPLANT
SUT VIC AB 1 CT1 27 (SUTURE)
SUT VIC AB 1 CT1 27XBRD ANBCTR (SUTURE) IMPLANT
SUT VIC AB 2-0 CT1 27 (SUTURE)
SUT VIC AB 2-0 CT1 TAPERPNT 27 (SUTURE) ×4 IMPLANT
TOWEL GREEN STERILE (TOWEL DISPOSABLE) ×4 IMPLANT
TRAY FOLEY MTR SLVR 16FR STAT (SET/KITS/TRAYS/PACK) IMPLANT
WATER STERILE IRR 1000ML POUR (IV SOLUTION) ×4 IMPLANT

## 2023-04-05 NOTE — Transfer of Care (Signed)
Immediate Anesthesia Transfer of Care Note  Patient: Chelsea Keller  Procedure(s) Performed: OPEN REDUCTION INTERNAL FIXATION (ORIF) DISTAL FEMUR FRACTURE (Left: Knee)  Patient Location: PACU  Anesthesia Type:General  Level of Consciousness: awake and patient cooperative  Airway & Oxygen Therapy: Patient Spontanous Breathing and Patient connected to face mask oxygen  Post-op Assessment: Report given to RN, Post -op Vital signs reviewed and stable, and Patient moving all extremities  Post vital signs: Reviewed and stable  Last Vitals:  Vitals Value Taken Time  BP 121/92 04/05/23 1815  Temp    Pulse 88 04/05/23 1816  Resp 23 04/05/23 1816  SpO2 97 % 04/05/23 1816  Vitals shown include unfiled device data.  Last Pain:  Vitals:   04/05/23 1541  TempSrc: Oral  PainSc: 3       Patients Stated Pain Goal: 0 (04/05/23 0046)  Complications: No notable events documented.

## 2023-04-05 NOTE — Anesthesia Postprocedure Evaluation (Signed)
Anesthesia Post Note  Patient: Chelsea Keller  Procedure(s) Performed: OPEN REDUCTION INTERNAL FIXATION (ORIF) DISTAL FEMUR FRACTURE (Left: Knee)     Patient location during evaluation: PACU Anesthesia Type: General Level of consciousness: awake and alert Pain management: pain level controlled Vital Signs Assessment: post-procedure vital signs reviewed and stable Respiratory status: spontaneous breathing, nonlabored ventilation, respiratory function stable and patient connected to nasal cannula oxygen Cardiovascular status: blood pressure returned to baseline and stable Postop Assessment: no apparent nausea or vomiting Anesthetic complications: no   No notable events documented.  Last Vitals:  Vitals:   04/05/23 1915 04/05/23 1943  BP: 106/71 130/63  Pulse: 84 90  Resp: 15 16  Temp: (!) 36.1 C 36.5 C  SpO2: 98% 96%    Last Pain:  Vitals:   04/05/23 2034  TempSrc:   PainSc: Asleep                 Collene Schlichter

## 2023-04-05 NOTE — Progress Notes (Signed)
PROGRESS NOTE    Chelsea Keller  QMV:[id number] DOB: Apr 03, 1935 DOA: 04/03/2023 PCP: [name], MD   Brief Narrative:  This 88 yrs old female with PMH significant for chronic diastolic CHF, essential hypertension, chronic blood loss anemia requiring blood transfusion, morbid obesity who presented in the ED due to fall sustained at home. Patient states she has lost her balance while coming out of the bathroom and heading to her bedroom due to her legs giving out on her. She had difficulty in being able to bear weight on her left leg.  Patient also complained of 4 to 5 weeks onset of abdominal pain mainly in the lower abdomen.  She has complained of constipation for 2 weeks, then has taken MiraLAX but now has developed non- bloody diarrhea. Workup in the ED Left knee x-ray shows acute comminuted fracture of distal femoral metaphysis at the level of femoral component of knee replacement.  CT chest, abdomen and pelvis showed no evidence of acute traumatic injury in the chest,  abdomen or pelvis.  This pericolonic inflammation along the descending colon suggesting diverticulitis.  CT head and neck no acute intracranial abnormality. Patient is admitted for further evaluation.  Orthopedics consulted.  Started on IV Unasyn.   Assessment & Plan:   Principal Problem:   Closed fracture of distal end of femur, unspecified fracture morphology, initial encounter (HCC) Active Problems:   Essential hypertension   Chronic diastolic CHF (congestive heart failure) (HCC)   Acute diarrhea   Anemia of chronic disease   Acute diverticulitis   Leukocytosis   Chronic kidney disease, stage 3b (HCC)   Recurrent deep vein thrombosis (DVT) (HCC)  Acute comminuted fracture of distal femoral metaphysis, Left: Left knee x-ray showed fracture of distal femoral metaphysis Continue IV fentanyl as needed. Continue fall precautions Orthopedist on-call (Dr. Hulda Humphrey) was consulted ,  Patient is scheduled for ORIF  today. Follow-up postoperative orders.  PT and OT eval   Acute diverticulitis: CT chest, abdomen and pelvis was suggestive of diverticulitis. Patient initiated on IV Unasyn. Continue with Unasyn and de-escalate and discontinue based on cultures and stool cultures. Continue IV hydration and adequate pain control.   Acute diarrhea: Diarrhea improved. Continue treatment as indicated for diverticulitis.   Leukocytosis: WBC 14.3, this is possibly secondary to diverticulitis vs reactive. Continue antibiotics treatment for diverticulitis.   Anemia of chronic disease: Hemoglobin 9.6 > dropped to 8.2> 7.9 Could be hemodilution.  Type and screen. Transfuse if hemoglobin drops below 7.0.   AKI on Chronic kidney disease stage IIIb: Creatinine at 1.3, baseline 1.0-1.1 Renally adjust medications, Avoid nephrotoxic agents/dehydration/hypotension.   History of recurrent DVTs Eliquis will be temporarily held at this time in anticipation for possible surgical intervention.   Essential hypertension (controlled) Continue triamterene-HCTZ   Chronic diastolic CHF Stable, hold torsemide.   DVT prophylaxis:  SCDs Code Status: Full code Family Communication: No family at bed side Disposition Plan:    Status is: Inpatient Remains inpatient appropriate because: Admitted to s/p mechanical fall with left femoral fracture.  Also has diverticulitis started on IV Zosyn.   orthopedics is consulted, Patient is scheduled for left ORIF today.   Consultants:  [facility]  Procedures: Antimicrobials:  Anti-infectives (From admission, onward)    Start     Dose/Rate Route Frequency Ordered Stop   04/04/23 2200  Ampicillin-Sulbactam (UNASYN) 3 g in sodium chloride 0.9 % 100 mL IVPB        3 g 200 mL/hr over 30 Minutes Intravenous Every 8 hours  04/03/23 2025     04/03/23 1800  Ampicillin-Sulbactam (UNASYN) 3 g in sodium chloride 0.9 % 100 mL IVPB        3 g 200 mL/hr over 30 Minutes Intravenous  Once  04/03/23 1747 04/03/23 2100      Subjective: Patient was seen and examined at bedside.  Overnight events noted.   Patient reports doing better,  She still reports having pain in the lower abdomen but much improved. She reports pain in the left thigh.  She is scheduled to have left ORIF today.  Objective: Vitals:   04/04/23 1505 04/04/23 2140 04/05/23 0427 04/05/23 0845  BP: 136/75 (!) 155/65 138/72 (!) 135/51  Pulse:  (!) 119 99   Resp: 18 20 18 19   Temp: 98.1 F (36.7 C) 98.5 F (36.9 C) 98.5 F (36.9 C) 98.4 F (36.9 C)  TempSrc: Oral Oral  Oral  SpO2: 92% 94% 96% 97%  Weight:      Height:        Intake/Output Summary (Last 24 hours) at 04/05/2023 1150 Last data filed at 04/05/2023 0900 Gross per 24 hour  Intake 630 ml  Output 1150 ml  Net -520 ml   Filed Weights   04/03/23 1339  Weight: 108 kg    Examination:  General exam: Appears comfortable, deconditioned, not in any acute distress. Respiratory system: CTA bilaterally. Respiratory effort normal. RR 14 Cardiovascular system: S1 & S2 heard, RRR. No murmer. No pedal edema. Gastrointestinal system: Abdomen is soft, mildly tender, non distended.  Normal bowel sounds heard. Central nervous system: Alert and oriented x 3. No focal neurological deficits. Extremities: No edema, no cyanosis, no clubbing  Skin: No rashes, lesions or ulcers Psychiatry: Judgement and insight appear normal. Mood & affect appropriate.     Data Reviewed: I have personally reviewed following labs and imaging studies  CBC: Recent Labs  Lab 04/03/23 1513 04/04/23 0431 04/05/23 0401  WBC 14.3* 15.3* 21.3*  NEUTROABS 12.2*  --   --   HGB 9.6* 8.2* 7.9*  HCT 31.9* 26.9* 27.2*  MCV 89.4 90.6 96.5  PLT 273 303 235   Basic Metabolic Panel: Recent Labs  Lab 04/03/23 1513 04/04/23 0431 04/04/23 1105 04/05/23 0401  NA 136 134*  --  132*  K 4.1 5.3* 5.5* 4.6  CL 102 102  --  99  CO2 25 24  --  24  GLUCOSE 118* 117*  --  107*  BUN  27* 26*  --  34*  CREATININE 1.31* 1.54*  --  1.49*  CALCIUM 8.5* 8.2*  --  8.1*  MG  --  1.4*  --  2.0  PHOS  --  5.3*  --  4.0   GFR: Estimated Creatinine Clearance: 31.9 mL/min (A) (by C-G formula based on SCr of 1.49 mg/dL (H)). Liver Function Tests: Recent Labs  Lab 04/04/23 0431  AST 63*  ALT 45*  ALKPHOS 164*  BILITOT 2.4*  PROT 4.1*  ALBUMIN 2.4*   No results for input(s): "LIPASE", "AMYLASE" in the last 168 hours. No results for input(s): "AMMONIA" in the last 168 hours. Coagulation Profile: No results for input(s): "INR", "PROTIME" in the last 168 hours. Cardiac Enzymes: No results for input(s): "CKTOTAL", "CKMB", "CKMBINDEX", "TROPONINI" in the last 168 hours. BNP (last 3 results) No results for input(s): "PROBNP" in the last 8760 hours. HbA1C: No results for input(s): "HGBA1C" in the last 72 hours. CBG: No results for input(s): "GLUCAP" in the last 168 hours. Lipid Profile: No results  for input(s): "CHOL", "HDL", "LDLCALC", "TRIG", "CHOLHDL", "LDLDIRECT" in the last 72 hours. Thyroid Function Tests: No results for input(s): "TSH", "T4TOTAL", "FREET4", "T3FREE", "THYROIDAB" in the last 72 hours. Anemia Panel: No results for input(s): "VITAMINB12", "FOLATE", "FERRITIN", "TIBC", "IRON", "RETICCTPCT" in the last 72 hours. Sepsis Labs: No results for input(s): "PROCALCITON", "LATICACIDVEN" in the last 168 hours.  Recent Results (from the past 240 hour(s))  Blood culture (routine x 2)     Status: None (Preliminary result)   Collection Time: 04/03/23  6:22 PM   Specimen: BLOOD  Result Value Ref Range Status   Specimen Description   Final    BLOOD LEFT ANTECUBITAL Performed at [facility], [address], [place], [place] [address]    Special Requests   Final    BOTTLES DRAWN AEROBIC AND ANAEROBIC Blood Culture adequate volume Performed at [facility], [address], [place], [place] [address]    Culture   Final    NO GROWTH 2  DAYS Performed at [facility], [address], [place], [place] [address]    Report Status PENDING  Incomplete  Blood culture (routine x 2)     Status: None (Preliminary result)   Collection Time: 04/03/23  6:37 PM   Specimen: BLOOD  Result Value Ref Range Status   Specimen Description   Final    BLOOD RIGHT ANTECUBITAL Performed at [facility], [address], [place], [place] [address]    Special Requests   Final    BOTTLES DRAWN AEROBIC AND ANAEROBIC Blood Culture adequate volume Performed at [facility], [address], [place], [place] [address]    Culture   Final    NO GROWTH 2 DAYS Performed at [facility], [address] [place], [place] [address]    Report Status PENDING  Incomplete    Radiology Studies: DG Foot 2 Views Right  Result Date: 04/03/2023 CLINICAL DATA:  Fall, right leg pain, foot pain EXAM: RIGHT FOOT - 2 VIEW COMPARISON:  None Available. FINDINGS: No acute bony abnormality. Specifically, no fracture, subluxation, or dislocation. Diffuse osteopenia. Vascular and soft tissue calcifications. IMPRESSION: No acute bony abnormality. Electronically Signed   By: Charlett Nose M.D.   On: 04/03/2023 20:56   CT CHEST ABDOMEN PELVIS W CONTRAST  Result Date: 04/03/2023 CLINICAL DATA:  Patient fell. Poly trauma, blunt. Chronic anticoagulation. EXAM: CT CHEST, ABDOMEN, AND PELVIS WITH CONTRAST TECHNIQUE: Multidetector CT imaging of the chest, abdomen and pelvis was performed following the standard protocol during bolus administration of intravenous contrast. RADIATION DOSE REDUCTION: This exam was performed according to the departmental dose-optimization program which includes automated exposure control, adjustment of the mA and/or kV according to patient size and/or use of iterative reconstruction technique. CONTRAST:  OMNIPAQUE IOHEXOL 300 MG/ML  SOLN COMPARISON:  Abdomen and pelvis CT 03/17/2023 FINDINGS: CT CHEST FINDINGS  Cardiovascular: The heart size is normal. No substantial pericardial effusion. Coronary artery calcification is evident. Moderate atherosclerotic calcification is noted in the wall of the thoracic aorta. Enlargement of the pulmonary outflow tract/main pulmonary arteries suggests pulmonary arterial hypertension. Mediastinum/Nodes: No mediastinal lymphadenopathy. There is no hilar lymphadenopathy. The esophagus has normal imaging features. There is no axillary lymphadenopathy. Lungs/Pleura: No suspicious pulmonary nodule or mass. No focal airspace consolidation. No pleural effusion. Musculoskeletal: No worrisome lytic or sclerotic osseous abnormality. Chronic posttraumatic/degenerative deformity noted right shoulder. No sternal fracture. No evidence for thoracic spine fracture. CT ABDOMEN PELVIS FINDINGS Hepatobiliary: No  suspicious focal abnormality within the liver parenchyma. Gallbladder is surgically absent. No intrahepatic or extrahepatic biliary dilation. Pancreas: No focal mass lesion. No dilatation of the main duct. No intraparenchymal cyst. No peripancreatic edema. Spleen: No splenomegaly. No suspicious focal mass lesion. Adrenals/Urinary Tract: Small left adrenal nodule stable since 04/11/2018 consistent with benign etiology such as adenoma. No followup imaging is recommended. Vascular calcification seen in the hilum of each kidney. No suspicious renal mass lesion. Small cyst noted lower pole left kidney. No followup imaging is recommended. No evidence for hydroureter. The urinary bladder appears normal for the degree of distention. Stomach/Bowel: Stomach is unremarkable. No gastric wall thickening. No evidence of outlet obstruction. Duodenum is normally positioned as is the ligament of Treitz. Portions of the small bowel and right colon have been excluded from the field of view. Within this limitation there is no small bowel dilatation to suggest obstruction. No colonic dilatation. Diverticular changes are  noted in the left colon without evidence of diverticulitis. There is some subtle pericolonic edema/inflammation along the distal descending colon (axial image 71/series 4) suggesting diverticulitis Vascular/Lymphatic: There is moderate atherosclerotic calcification of the abdominal aorta without aneurysm. There is no gastrohepatic or hepatoduodenal ligament lymphadenopathy. No retroperitoneal or mesenteric lymphadenopathy. No pelvic sidewall lymphadenopathy. Reproductive: Hysterectomy.  There is no adnexal mass. Other: No intraperitoneal free fluid. Musculoskeletal: No worrisome lytic or sclerotic osseous abnormality. L4 superior endplate compression deformity is stable since 03/17/2023. IMPRESSION: 1. No evidence for acute traumatic injury in the chest, abdomen, or pelvis. 2. Subtle pericolonic edema/inflammation along the distal descending colon suggesting diverticulitis. To suggest perforation or abscess. 3. Enlargement of the pulmonary outflow tract/main pulmonary arteries suggests pulmonary arterial hypertension. 4.  Aortic Atherosclerosis (ICD10-I70.0). No findings Electronically Signed   By: Kennith Center M.D.   On: 04/03/2023 17:30   CT HEAD WO CONTRAST ( )  Result Date: 04/03/2023 CLINICAL DATA:  Head trauma.  Fall.  Patient on blood thinner. EXAM: CT HEAD WITHOUT CONTRAST TECHNIQUE: Contiguous axial images were obtained from the base of the skull through the vertex without intravenous contrast. RADIATION DOSE REDUCTION: This exam was performed according to the departmental dose-optimization program which includes automated exposure control, adjustment of the mA and/or kV according to patient size and/or use of iterative reconstruction technique. COMPARISON:  Head CT dated 07/17/2016. FINDINGS: Brain: Mild age-related atrophy and chronic microvascular ischemic changes. There is no acute intracranial hemorrhage. No mass effect or midline shift. No extra-axial fluid collection. Vascular: No hyperdense  vessel or unexpected calcification. Skull: Normal. Negative for fracture or focal lesion. Sinuses/Orbits: No acute finding. Other: None IMPRESSION: 1. No acute intracranial pathology. 2. Mild age-related atrophy and chronic microvascular ischemic changes. Electronically Signed   By: Elgie Collard M.D.   On: 04/03/2023 17:27   CT CERVICAL SPINE WO CONTRAST  Result Date: 04/03/2023 CLINICAL DATA:  Fall.  Polytrauma, blunt EXAM: CT CERVICAL SPINE WITHOUT CONTRAST TECHNIQUE: Multidetector CT imaging of the cervical spine was performed without intravenous contrast. Multiplanar CT image reconstructions were also generated. RADIATION DOSE REDUCTION: This exam was performed according to the departmental dose-optimization program which includes automated exposure control, adjustment of the mA and/or kV according to patient size and/or use of iterative reconstruction technique. COMPARISON:  None Available. FINDINGS: Alignment: 3 mm degenerative anterolisthesis of C3 on C4. Skull base and vertebrae: Diffuse osteopenia. No fracture or focal bone lesion. Soft tissues and spinal canal: No prevertebral fluid or swelling. No visible canal hematoma. Disc levels: Advanced diffuse degenerative facet disease bilaterally. Mild  degenerative disc disease with disc space narrowing and anterior spurring. No visible disc herniation. Upper chest: No acute findings Other: None IMPRESSION: Moderate to advanced multilevel degenerative spondylosis. No acute bony abnormality. Electronically Signed   By: Charlett Nose M.D.   On: 04/03/2023 17:22   DG Knee 2 Views Left  Result Date: 04/03/2023 CLINICAL DATA:  Fall.  Pain. EXAM: LEFT KNEE - 1-2 VIEW COMPARISON:  None Available. FINDINGS: Status post tricompartmental knee replacement. Acute comminuted fracture of the distal femoral metaphysis identified at the level of the femoral component. There is approximately 1/2 shaft with the posterior displacement of the femoral component of the knee  replacement relative to the metaphyseal shaft. IMPRESSION: Acute comminuted fracture of the distal femoral metaphysis at the level of the femoral component of the knee replacement. Electronically Signed   By: Kennith Center M.D.   On: 04/03/2023 15:17   DG FEMUR, MIN 2 VIEWS RIGHT  Result Date: 04/03/2023 CLINICAL DATA:  Fall with pain. EXAM: RIGHT FEMUR 2 VIEWS COMPARISON:  None Available. FINDINGS: Study limited by osteopenia and body habitus. No displaced fracture of the femoral neck evident. Degenerative changes are seen in all 3 compartments of the knee with small joint effusion. IMPRESSION: 1. Limited study due to osteopenia and body habitus. No displaced fracture evident. 2. Degenerative changes in the right knee with small joint effusion. Electronically Signed   By: Kennith Center M.D.   On: 04/03/2023 15:16   DG Hip Unilat W or Wo Pelvis 2-3 Views Left  Result Date: 04/03/2023 CLINICAL DATA:  Fall.  Left leg pain. EXAM: DG HIP (WITH OR WITHOUT PELVIS) 2-3V LEFT COMPARISON:  04/03/2017 FINDINGS: Bones are diffusely demineralized. No evidence for pubic ramus or sacral fracture. AP and cross-table lateral views of the left hip show no evidence for a displaced left femoral neck fracture although assessment is limited by osteopenia and body habitus. IMPRESSION: No evidence for displaced left femoral neck fracture although assessment is limited by osteopenia and body habitus. If there is high clinical concern for occult fracture, CT recommended to further evaluate. Electronically Signed   By: Kennith Center M.D.   On: 04/03/2023 15:15   DG Chest Portable 1 View  Result Date: 04/03/2023 CLINICAL DATA:  Fall.  Pain everywhere. EXAM: PORTABLE CHEST 1 VIEW COMPARISON:  11/12/2022 FINDINGS: The cardio pericardial silhouette is enlarged. Mild vascular congestion without edema. The lungs are clear without focal pneumonia, edema, pneumothorax or pleural effusion. No acute bony abnormality. IMPRESSION:  Enlargement of the cardiopericardial silhouette with mild vascular congestion. Electronically Signed   By: Kennith Center M.D.   On: 04/03/2023 15:14    Scheduled Meds:  triamterene-hydrochlorothiazide  1 tablet Oral Daily   Continuous Infusions:  ampicillin-sulbactam (UNASYN) IV 3 g (04/05/23 0554)     LOS: 2 days    Time spent: 35 mins    Willeen Niece, MD [facility]   If 7PM-7AM, please contact night-coverage

## 2023-04-05 NOTE — Op Note (Signed)
Orthopaedic Surgery Operative Note (CSN: [id number] ) Date of Surgery: 04/05/2023  Admit Date: 04/03/2023   Diagnoses: Pre-Op Diagnoses: Left periprosthetic distal femur fracture  Post-Op Diagnosis: Same  Procedures: CPT 27511-Open reduction internal fixation of left distal femur fracture  Surgeons : Primary: Roby Lofts, MD  Assistant: Ulyses Southward, PA-C  Location: OR 3   Anesthesia: General   Antibiotics: Ancef 1g preop with 1 gm vancomycin powder placed topically   Tourniquet time:None    Estimated Blood Loss: 100 mL  Complications:* No complications entered in OR log *   Specimens:* No specimens in log *   Implants: Implant Name Type Inv. Item Serial No. Manufacturer Lot No. LRB No. Used Action  PLATE DIST FEM 40J - [id number] Plate PLATE DIST FEM 56O  ZIMMER [employer]ON(ORTH,TRAU,BIO,SG) ON TRAY Left 1 Implanted  CAP LOCK NCB - [id number] Cap CAP LOCK NCB  ZIMMER RECON(ORTH,TRAU,BIO,SG) ON TRAY Left 7 Implanted  SCREW NCB 5.0X75MM - [id number] Screw SCREW NCB 5.0X75MM  ZIMMER RECON(ORTH,TRAU,BIO,SG) ON TRAY Left 2 Implanted  SCREW 5.0 - [id number] Screw SCREW 5.0  ZIMMER RECON(ORTH,TRAU,BIO,SG) ON TRAY Left 3 Implanted  SCREW CORTICAL NCB 5.0X40 - [id number] Screw SCREW CORTICAL NCB 5.0X40  ZIMMER RECON(ORTH,TRAU,BIO,SG) ON TRAY Left 1 Implanted  SCREW CORTICAL NCB 5.0X40 - [id number] Screw SCREW CORTICAL NCB 5.0X40  ZIMMER RECON(ORTH,TRAU,BIO,SG) ON TRAY Left 2 Implanted  SCREW NCB 4.0X40MM - [id number] Screw SCREW NCB 4.0X40MM  ZIMMER RECON(ORTH,TRAU,BIO,SG) ON TRAY Left 1 Implanted     Indications for Surgery: 88 year old female who sustained a left periprosthetic distal femur fracture.  Due to the unstable nature of her injury I recommend proceeding with open reduction internal fixation.  Risks and benefits were discussed with the patient.  Risks included but not limited to bleeding, infection, malunion, nonunion, hardware failure, hardware rotation, nerve  and blood vessel injury, DVT, even the possibility anesthetic complications she agreed to proceed with surgery and consent was obtained.  Operative Findings: Open reduction internal fixation of left periprosthetic distal femur fracture using [employer] NCB distal femoral locking plate.  Procedure: The patient was identified in the preoperative holding area. Consent was confirmed with the patient and their family and all questions were answered. The operative extremity was marked after confirmation with the patient. she was then brought back to the operating room by our anesthesia colleagues.  She was placed under general anesthetic and carefully transferred over to radiolucent flattop table.  A bump was placed under her operative hip.  Left lower extremity was then prepped and draped in usual sterile fashion.  A timeout was performed to verify the patient, the procedure, and the extremity.  Preoperative antibiotics were dosed.  Fluoroscopic imaging was obtained to show the unstable nature of the injury.  The hip and knee were flexed over a triangle.  A lateral approach to the distal femur was made and carried down through skin subcutaneous tissue.  I incised through the IT band and mobilized the vastus lateralis to be able to access the femoral shaft.  I did use a Cobb elevator to shift the shaft below the total knee arthroplasty.  There was a lot of comminution laterally based.  I then chose a [employer] NCB distal femoral locking plate and slid this submuscularly along the lateral cortex of the femur.  I attached to the targeting arm.  I placed a 2.0 mm guidewire distally to hold the distal portion of plate in line with the bone.  I then percutaneously placed a 3.3  mm drill bit to align the proximal portion of the plate with the bone.  I then drilled and placed 5.0 millimeter screws distally to bring the plate flush to bone I then drilled and placed 5.0 millimeter screws into the femoral shaft to  align the coronal fracture plane.  I then removed the 3.3 mm drill bit proximally and placed a 4.0 millimeter screw.  I then placed locking caps on the 5.0 millimeter screws and I removed the targeting arm.  I then drilled and placed 5.0 millimeter screws distally for a total of 5.  Locking caps were placed on all the 5 distal screws.  Fluoroscopic imaging was obtained.  The incision was copiously irrigated.  A gram of vancomycin powder was placed into the incision.  A layered closure of 0 Vicryl, 2-0 Monocryl and 3-0 Monocryl was used to close the skin.  Sterile dressings were applied.  Patient was then awoke from anesthesia and taken to the PACU in stable condition.  Post Op Plan/Instructions: Patient will be touchdown weightbearing to the left lower extremity.  She will receive postoperative antibiotics.  She will receive Lovenox for DVT prophylaxis.  She will discharge on an oral DOAC.  Will have her mobilize with physical and Occupational Therapy.  I was present and performed the entire surgery.  Ulyses Southward, PA-C did assist me throughout the case. An assistant was necessary given the difficulty in approach, maintenance of reduction and ability to instrument the fracture.   Truitt Merle, MD [facility]

## 2023-04-05 NOTE — Plan of Care (Signed)
  Problem: Education: Goal: Knowledge of General Education information will improve Description: Including pain rating scale, medication(s)/side effects and non-pharmacologic comfort measures Outcome: Progressing   Problem: Health Behavior/Discharge Planning: Goal: Ability to manage health-related needs will improve Outcome: Progressing   Problem: Activity: Goal: Risk for activity intolerance will decrease Outcome: Progressing   Problem: Coping: Goal: Level of anxiety will decrease Outcome: Progressing   Problem: Elimination: Goal: Will not experience complications related to bowel motility Outcome: Progressing   

## 2023-04-05 NOTE — Interval H&P Note (Signed)
History and Physical Interval Note:  04/05/2023 3:36 PM  Chelsea Keller  has presented today for surgery, with the diagnosis of Left distal femur fracture.  The various methods of treatment have been discussed with the patient and family. After consideration of risks, benefits and other options for treatment, the patient has consented to  Procedure(s): OPEN REDUCTION INTERNAL FIXATION (ORIF) DISTAL FEMUR FRACTURE (Left) as a surgical intervention.  The patient's history has been reviewed, patient examined, no change in status, stable for surgery.  I have reviewed the patient's chart and labs.  Questions were answered to the patient's satisfaction.     Caryn Bee P Zhoe Catania

## 2023-04-05 NOTE — H&P (View-Only) (Signed)
Reason for Consult:Left distal femur fx Referring Physician: Floyde Parkins Time called: [id number] Time at bedside: 0858   Chelsea Keller is an 88 y.o. female.  HPI: Chelsea Keller's knees gave way yesterday and she fell. She had immediate left knee pain and could not get up. She was brought to the ED where x-rays showed a distal femur fx and orthopedic surgery was consulted.Due to the complexity of the fx orthopedic trauma consultation was requested.  Past Medical History:  Diagnosis Date   Allergy    Anemia    Anxiety    Arthritis    Arthritis of sacroiliac joint of both sides 11/12/2017   Bilateral pulmonary embolism (HCC) 06/23/2017   Chronic diastolic CHF (congestive heart failure) (HCC)    a. Echo 1/16:  mild LVH, EF normal, grade 1 DD, MAC   Chronic venous insufficiency    chronic LE edema   DDD (degenerative disc disease), lumbar 11/12/2017   Degenerative joint disease (DJD) of hip, Bilateral  11/12/2017   Depression    Fibromyalgia    constant pain   Hx of cardiac catheterization    a. LHC in [place] "ok" per patient with mild plaque in a single vessel - records not available   Hx of cardiovascular stress test    a. Nuclear study in 2008 normal   Hx of colonic polyps    Hypertension    Hypertriglyceridemia    Impaired fasting glucose    PONV (postoperative nausea and vomiting)    PUD (peptic ulcer disease)    hx of gastric ulcer   Pulmonary emboli (HCC) 06/2017   Vitamin B12 deficiency     Past Surgical History:  Procedure Laterality Date   ABDOMINAL HYSTERECTOMY     CATARACT EXTRACTION  10/2003   OD   CHOLECYSTECTOMY     COLONOSCOPY W/ POLYPECTOMY     COLONOSCOPY WITH PROPOFOL N/A 10/15/2014   Procedure: COLONOSCOPY WITH PROPOFOL;  Surgeon: Meryl Dare, MD;  Location: [facility] ENDOSCOPY;  Service: Endoscopy;  Laterality: N/A;   COLONOSCOPY WITH PROPOFOL N/A 03/23/2018   Procedure: COLONOSCOPY WITH PROPOFOL;  Surgeon: Iva Boop, MD;  Location: [facility] ENDOSCOPY;  Service: Endoscopy;   Laterality: N/A;   ESOPHAGOGASTRODUODENOSCOPY (EGD) WITH PROPOFOL N/A 10/15/2014   Procedure: ESOPHAGOGASTRODUODENOSCOPY (EGD) WITH PROPOFOL;  Surgeon: Meryl Dare, MD;  Location: [facility] ENDOSCOPY;  Service: Endoscopy;  Laterality: N/A;   EXTERNAL FIXATION ANKLE FRACTURE     Fx.  left ankle-fixation with pins later removed sec to infection 1985   EXTERNAL FIXATION WRIST FRACTURE  1985   left with pins   EYE SURGERY     FEMUR FRACTURE SURGERY  06/2009   FRACTURE SURGERY     HARDWARE REMOVAL Left 10/05/2013   Procedure: HARDWARE REMOVAL LEFT DISTAL FEMUR;  Surgeon: Budd Palmer, MD;  Location: [facility] OR;  Service: Orthopedics;  Laterality: Left;   HEMIARTHROPLASTY SHOULDER FRACTURE  06/2009   HOT HEMOSTASIS N/A 03/23/2018   Procedure: HOT HEMOSTASIS (ARGON PLASMA COAGULATION/BICAP);  Surgeon: Iva Boop, MD;  Location: [facility] ENDOSCOPY;  Service: Endoscopy;  Laterality: N/A;   JOINT REPLACEMENT     STERIOD INJECTION Right 10/05/2013   Procedure: STEROID INJECTION;  Surgeon: Budd Palmer, MD;  Location: [facility] OR;  Service: Orthopedics;  Laterality: Right;   TONSILLECTOMY     TONSILLECTOMY     TOTAL KNEE ARTHROPLASTY  03/09   left    Family History  Problem Relation Age of Onset   Depression Mother    Cancer Mother  uterine cancer   Heart attack Father    Cancer Brother        prostate cancer   Diabetes Maternal Aunt    Arthritis Brother    Asthma Brother    Stroke Maternal Grandmother    Pulmonary embolism Daughter     Social History:  reports that she quit smoking about 29 years ago. Her smoking use included cigarettes. She started smoking about 69 years ago. She has a 40 pack-year smoking history. She has been exposed to tobacco smoke. She has never used smokeless tobacco. She reports that she does not drink alcohol and does not use drugs.  Allergies:  Allergies  Allergen Reactions   Codeine Sulfate Shortness Of Breath   Celecoxib Other (See Comments)    Caused vaginal  bleeding   Erythromycin Base Nausea And Vomiting   Spironolactone Nausea Only and Other (See Comments)    Blurred vision, Upset stomach, lethargy    Medications: I have reviewed the patient's current medications.  Results for orders placed or performed during the hospital encounter of 04/03/23 (from the past 48 hour(s))  CBC with Differential     Status: Abnormal   Collection Time: 04/03/23  3:13 PM  Result Value Ref Range   WBC 14.3 (H) 4.0 - 10.5 K/uL   RBC 3.57 (L) 3.87 - 5.11 MIL/uL   Hemoglobin 9.6 (L) 12.0 - 15.0 g/dL   HCT 41.3 (L) 24.4 - 01.0 %   MCV 89.4 80.0 - 100.0 fL   MCH 26.9 26.0 - 34.0 pg   MCHC 30.1 30.0 - 36.0 g/dL   RDW 27.2 (H) 53.6 - 64.4 %   Platelets 273 150 - 400 K/uL   nRBC 0.4 (H) 0.0 - 0.2 %   Neutrophils Relative % 86 %   Neutro Abs 12.2 (H) 1.7 - 7.7 K/uL   Lymphocytes Relative 4 %   Lymphs Abs 0.6 (L) 0.7 - 4.0 K/uL   Monocytes Relative 8 %   Monocytes Absolute 1.2 (H) 0.1 - 1.0 K/uL   Eosinophils Relative 0 %   Eosinophils Absolute 0.0 0.0 - 0.5 K/uL   Basophils Relative 0 %   Basophils Absolute 0.1 0.0 - 0.1 K/uL   Immature Granulocytes 2 %   Abs Immature Granulocytes 0.25 (H) 0.00 - 0.07 K/uL   Polychromasia PRESENT    Ovalocytes PRESENT     Comment: Performed at [facility], [address], [place], [place] [address]  Basic metabolic panel     Status: Abnormal   Collection Time: 04/03/23  3:13 PM  Result Value Ref Range   Sodium 136 135 - 145 mmol/L   Potassium 4.1 3.5 - 5.1 mmol/L   Chloride 102 98 - 111 mmol/L   CO2 25 22 - 32 mmol/L   Glucose, Bld 118 (H) 70 - 99 mg/dL    Comment: Glucose reference range applies only to samples taken after fasting for at least 8 hours.   BUN 27 (H) 8 - 23 mg/dL   Creatinine, Ser 2.59 (H) 0.44 - 1.00 mg/dL   Calcium 8.5 (L) 8.9 - 10.3 mg/dL   GFR, Estimated 40 (L) >60 mL/min    Comment: (NOTE) Calculated using the CKD-EPI Creatinine Equation (2021)    Anion gap 9 5 - 15     Comment: Performed at [facility], [address] [place], [place] [address]  Blood culture (routine x 2)     Status: None (Preliminary result)   Collection Time: 04/03/23  6:22 PM   Specimen: BLOOD  Result Value Ref Range   Specimen Description      BLOOD LEFT ANTECUBITAL Performed at [facility], [address], [place], [place] [address]    Special Requests      BOTTLES DRAWN AEROBIC AND ANAEROBIC Blood Culture adequate volume Performed at [facility], [address], [place], [place] [address]    Culture      NO GROWTH 2 DAYS Performed at [facility], [address] [place], [place] [address]    Report Status PENDING   Blood culture (routine x 2)     Status: None (Preliminary result)   Collection Time: 04/03/23  6:37 PM   Specimen: BLOOD  Result Value Ref Range   Specimen Description      BLOOD RIGHT ANTECUBITAL Performed at [facility], [address], [place], [place] [address]    Special Requests      BOTTLES DRAWN AEROBIC AND ANAEROBIC Blood Culture adequate volume Performed at [facility], [address] [place], [place] [address]    Culture      NO GROWTH 2 DAYS Performed at [facility], [address], [place], [place] [address]    Report Status PENDING   Comprehensive metabolic panel     Status: Abnormal   Collection Time: 04/04/23  4:31 AM  Result Value Ref Range   Sodium 134 (L) 135 - 145 mmol/L   Potassium 5.3 (H) 3.5 - 5.1 mmol/L   Chloride 102 98 - 111 mmol/L   CO2 24 22 - 32 mmol/L   Glucose, Bld 117 (H) 70 - 99 mg/dL    Comment: Glucose reference range applies only to samples taken after fasting for at least 8 hours.   BUN 26 (H) 8 - 23 mg/dL   Creatinine, Ser 5.78 (H) 0.44 - 1.00 mg/dL   Calcium 8.2 (L) 8.9 - 10.3 mg/dL   Total Protein 4.1 (L) 6.5 - 8.1 g/dL   Albumin 2.4 (L) 3.5 - 5.0 g/dL   AST 63 (H) 15 - 41 U/L   ALT 45 (H) 0 - 44 U/L   Alkaline  Phosphatase 164 (H) 38 - 126 U/L   Total Bilirubin 2.4 (H) 0.3 - 1.2 mg/dL   GFR, Estimated 33 (L) >60 mL/min    Comment: (NOTE) Calculated using the CKD-EPI Creatinine Equation (2021)    Anion gap 8 5 - 15    Comment: Performed at [facility], [address], [place], [place] [address]  CBC     Status: Abnormal   Collection Time: 04/04/23  4:31 AM  Result Value Ref Range   WBC 15.3 (H) 4.0 - 10.5 K/uL   RBC 2.97 (L) 3.87 - 5.11 MIL/uL   Hemoglobin 8.2 (L) 12.0 - 15.0 g/dL   HCT 95.2 (L) 84.1 - 32.4 %   MCV 90.6 80.0 - 100.0 fL   MCH 27.6 26.0 - 34.0 pg   MCHC 30.5 30.0 - 36.0 g/dL   RDW 40.1 (H) 02.7 - 25.3 %   Platelets 303 150 - 400 K/uL   nRBC 0.7 (H) 0.0 - 0.2 %    Comment: Performed at [facility], [address], [place], [place] [address]  Magnesium     Status: Abnormal   Collection Time: 04/04/23  4:31 AM  Result Value Ref Range   Magnesium 1.4 (L) 1.7 - 2.4 mg/dL    Comment: Performed at  [facility], [address] [place], [place] [address]  Phosphorus     Status: Abnormal   Collection Time: 04/04/23  4:31 AM  Result Value Ref Range   Phosphorus 5.3 (H) 2.5 - 4.6 mg/dL    Comment: Performed at [facility], [address], [place], [place] [address]  Potassium     Status: Abnormal   Collection Time: 04/04/23 11:05 AM  Result Value Ref Range   Potassium 5.5 (H) 3.5 - 5.1 mmol/L    Comment: Performed at [facility], [address], [place], [place] [address]  CBC     Status: Abnormal   Collection Time: 04/05/23  4:01 AM  Result Value Ref Range   WBC 21.3 (H) 4.0 - 10.5 K/uL   RBC 2.82 (L) 3.87 - 5.11 MIL/uL   Hemoglobin 7.9 (L) 12.0 - 15.0 g/dL   HCT 28.4 (L) 13.2 - 44.0 %   MCV 96.5 80.0 - 100.0 fL   MCH 28.0 26.0 - 34.0 pg   MCHC 29.0 (L) 30.0 - 36.0 g/dL   RDW 10.2 (H) 72.5 - 36.6 %   Platelets 235 150 - 400 K/uL   nRBC 0.7 (H) 0.0 - 0.2 %    Comment: Performed at [facility], [address], [place], [place]  [address]  Magnesium     Status: None   Collection Time: 04/05/23  4:01 AM  Result Value Ref Range   Magnesium 2.0 1.7 - 2.4 mg/dL    Comment: Performed at [facility], [address] [place], [place] [address]  Basic metabolic panel     Status: Abnormal   Collection Time: 04/05/23  4:01 AM  Result Value Ref Range   Sodium 132 (L) 135 - 145 mmol/L   Potassium 4.6 3.5 - 5.1 mmol/L   Chloride 99 98 - 111 mmol/L   CO2 24 22 - 32 mmol/L   Glucose, Bld 107 (H) 70 - 99 mg/dL    Comment: Glucose reference range applies only to samples taken after fasting for at least 8 hours.   BUN 34 (H) 8 - 23 mg/dL   Creatinine, Ser 5.63 (H) 0.44 - 1.00 mg/dL   Calcium 8.1 (L) 8.9 - 10.3 mg/dL   GFR, Estimated 34 (L) >60 mL/min    Comment: (NOTE) Calculated using the CKD-EPI Creatinine Equation (2021)    Anion gap 9 5 - 15    Comment: Performed at [facility], [address], [place], [place] [address]  Phosphorus     Status: None   Collection Time: 04/05/23  4:01 AM  Result Value Ref Range   Phosphorus 4.0 2.5 - 4.6 mg/dL    Comment: Performed at [facility], [address], [place], [place] [address]    DG Foot 2 Views Right  Result Date: 04/03/2023 CLINICAL DATA:  Fall, right leg pain, foot pain EXAM: RIGHT FOOT - 2 VIEW COMPARISON:  None Available. FINDINGS: No acute bony abnormality. Specifically, no fracture, subluxation, or dislocation. Diffuse osteopenia. Vascular and soft tissue calcifications. IMPRESSION: No acute bony abnormality. Electronically Signed   By: Charlett Nose M.D.   On: 04/03/2023 20:56   CT CHEST ABDOMEN PELVIS W CONTRAST  Result Date: 04/03/2023 CLINICAL DATA:  Patient fell. Poly trauma, blunt. Chronic anticoagulation. EXAM: CT CHEST, ABDOMEN, AND PELVIS WITH CONTRAST TECHNIQUE: Multidetector CT imaging of the chest, abdomen and pelvis was performed following the standard protocol during bolus administration of intravenous  contrast. RADIATION DOSE REDUCTION:  This exam was performed according to the departmental dose-optimization program which includes automated exposure control, adjustment of the mA and/or kV according to patient size and/or use of iterative reconstruction technique. CONTRAST:  OMNIPAQUE IOHEXOL 300 MG/ML  SOLN COMPARISON:  Abdomen and pelvis CT 03/17/2023 FINDINGS: CT CHEST FINDINGS Cardiovascular: The heart size is normal. No substantial pericardial effusion. Coronary artery calcification is evident. Moderate atherosclerotic calcification is noted in the wall of the thoracic aorta. Enlargement of the pulmonary outflow tract/main pulmonary arteries suggests pulmonary arterial hypertension. Mediastinum/Nodes: No mediastinal lymphadenopathy. There is no hilar lymphadenopathy. The esophagus has normal imaging features. There is no axillary lymphadenopathy. Lungs/Pleura: No suspicious pulmonary nodule or mass. No focal airspace consolidation. No pleural effusion. Musculoskeletal: No worrisome lytic or sclerotic osseous abnormality. Chronic posttraumatic/degenerative deformity noted right shoulder. No sternal fracture. No evidence for thoracic spine fracture. CT ABDOMEN PELVIS FINDINGS Hepatobiliary: No suspicious focal abnormality within the liver parenchyma. Gallbladder is surgically absent. No intrahepatic or extrahepatic biliary dilation. Pancreas: No focal mass lesion. No dilatation of the main duct. No intraparenchymal cyst. No peripancreatic edema. Spleen: No splenomegaly. No suspicious focal mass lesion. Adrenals/Urinary Tract: Small left adrenal nodule stable since 04/11/2018 consistent with benign etiology such as adenoma. No followup imaging is recommended. Vascular calcification seen in the hilum of each kidney. No suspicious renal mass lesion. Small cyst noted lower pole left kidney. No followup imaging is recommended. No evidence for hydroureter. The urinary bladder appears normal for the degree of distention. Stomach/Bowel: Stomach is  unremarkable. No gastric wall thickening. No evidence of outlet obstruction. Duodenum is normally positioned as is the ligament of Treitz. Portions of the small bowel and right colon have been excluded from the field of view. Within this limitation there is no small bowel dilatation to suggest obstruction. No colonic dilatation. Diverticular changes are noted in the left colon without evidence of diverticulitis. There is some subtle pericolonic edema/inflammation along the distal descending colon (axial image 71/series 4) suggesting diverticulitis Vascular/Lymphatic: There is moderate atherosclerotic calcification of the abdominal aorta without aneurysm. There is no gastrohepatic or hepatoduodenal ligament lymphadenopathy. No retroperitoneal or mesenteric lymphadenopathy. No pelvic sidewall lymphadenopathy. Reproductive: Hysterectomy.  There is no adnexal mass. Other: No intraperitoneal free fluid. Musculoskeletal: No worrisome lytic or sclerotic osseous abnormality. L4 superior endplate compression deformity is stable since 03/17/2023. IMPRESSION: 1. No evidence for acute traumatic injury in the chest, abdomen, or pelvis. 2. Subtle pericolonic edema/inflammation along the distal descending colon suggesting diverticulitis. To suggest perforation or abscess. 3. Enlargement of the pulmonary outflow tract/main pulmonary arteries suggests pulmonary arterial hypertension. 4.  Aortic Atherosclerosis (ICD10-I70.0). No findings Electronically Signed   By: Kennith Center M.D.   On: 04/03/2023 17:30   CT HEAD WO CONTRAST ( )  Result Date: 04/03/2023 CLINICAL DATA:  Head trauma.  Fall.  Patient on blood thinner. EXAM: CT HEAD WITHOUT CONTRAST TECHNIQUE: Contiguous axial images were obtained from the base of the skull through the vertex without intravenous contrast. RADIATION DOSE REDUCTION: This exam was performed according to the departmental dose-optimization program which includes automated exposure control, adjustment  of the mA and/or kV according to patient size and/or use of iterative reconstruction technique. COMPARISON:  Head CT dated 07/17/2016. FINDINGS: Brain: Mild age-related atrophy and chronic microvascular ischemic changes. There is no acute intracranial hemorrhage. No mass effect or midline shift. No extra-axial fluid collection. Vascular: No hyperdense vessel or unexpected calcification. Skull: Normal. Negative for fracture or focal lesion. Sinuses/Orbits: No acute finding.  Other: None IMPRESSION: 1. No acute intracranial pathology. 2. Mild age-related atrophy and chronic microvascular ischemic changes. Electronically Signed   By: Elgie Collard M.D.   On: 04/03/2023 17:27   CT CERVICAL SPINE WO CONTRAST  Result Date: 04/03/2023 CLINICAL DATA:  Fall.  Polytrauma, blunt EXAM: CT CERVICAL SPINE WITHOUT CONTRAST TECHNIQUE: Multidetector CT imaging of the cervical spine was performed without intravenous contrast. Multiplanar CT image reconstructions were also generated. RADIATION DOSE REDUCTION: This exam was performed according to the departmental dose-optimization program which includes automated exposure control, adjustment of the mA and/or kV according to patient size and/or use of iterative reconstruction technique. COMPARISON:  None Available. FINDINGS: Alignment: 3 mm degenerative anterolisthesis of C3 on C4. Skull base and vertebrae: Diffuse osteopenia. No fracture or focal bone lesion. Soft tissues and spinal canal: No prevertebral fluid or swelling. No visible canal hematoma. Disc levels: Advanced diffuse degenerative facet disease bilaterally. Mild degenerative disc disease with disc space narrowing and anterior spurring. No visible disc herniation. Upper chest: No acute findings Other: None IMPRESSION: Moderate to advanced multilevel degenerative spondylosis. No acute bony abnormality. Electronically Signed   By: Charlett Nose M.D.   On: 04/03/2023 17:22   DG Knee 2 Views Left  Result Date:  04/03/2023 CLINICAL DATA:  Fall.  Pain. EXAM: LEFT KNEE - 1-2 VIEW COMPARISON:  None Available. FINDINGS: Status post tricompartmental knee replacement. Acute comminuted fracture of the distal femoral metaphysis identified at the level of the femoral component. There is approximately 1/2 shaft with the posterior displacement of the femoral component of the knee replacement relative to the metaphyseal shaft. IMPRESSION: Acute comminuted fracture of the distal femoral metaphysis at the level of the femoral component of the knee replacement. Electronically Signed   By: Kennith Center M.D.   On: 04/03/2023 15:17   DG FEMUR, MIN 2 VIEWS RIGHT  Result Date: 04/03/2023 CLINICAL DATA:  Fall with pain. EXAM: RIGHT FEMUR 2 VIEWS COMPARISON:  None Available. FINDINGS: Study limited by osteopenia and body habitus. No displaced fracture of the femoral neck evident. Degenerative changes are seen in all 3 compartments of the knee with small joint effusion. IMPRESSION: 1. Limited study due to osteopenia and body habitus. No displaced fracture evident. 2. Degenerative changes in the right knee with small joint effusion. Electronically Signed   By: Kennith Center M.D.   On: 04/03/2023 15:16   DG Hip Unilat W or Wo Pelvis 2-3 Views Left  Result Date: 04/03/2023 CLINICAL DATA:  Fall.  Left leg pain. EXAM: DG HIP (WITH OR WITHOUT PELVIS) 2-3V LEFT COMPARISON:  04/03/2017 FINDINGS: Bones are diffusely demineralized. No evidence for pubic ramus or sacral fracture. AP and cross-table lateral views of the left hip show no evidence for a displaced left femoral neck fracture although assessment is limited by osteopenia and body habitus. IMPRESSION: No evidence for displaced left femoral neck fracture although assessment is limited by osteopenia and body habitus. If there is high clinical concern for occult fracture, CT recommended to further evaluate. Electronically Signed   By: Kennith Center M.D.   On: 04/03/2023 15:15   DG Chest  Portable 1 View  Result Date: 04/03/2023 CLINICAL DATA:  Fall.  Pain everywhere. EXAM: PORTABLE CHEST 1 VIEW COMPARISON:  11/12/2022 FINDINGS: The cardio pericardial silhouette is enlarged. Mild vascular congestion without edema. The lungs are clear without focal pneumonia, edema, pneumothorax or pleural effusion. No acute bony abnormality. IMPRESSION: Enlargement of the cardiopericardial silhouette with mild vascular congestion. Electronically Signed   By:  Kennith Center M.D.   On: 04/03/2023 15:14    Review of Systems  HENT:  Negative for ear discharge, ear pain, hearing loss and tinnitus.   Eyes:  Negative for photophobia and pain.  Respiratory:  Negative for cough and shortness of breath.   Cardiovascular:  Negative for chest pain.  Gastrointestinal:  Negative for abdominal pain, nausea and vomiting.  Genitourinary:  Negative for dysuria, flank pain, frequency and urgency.  Musculoskeletal:  Positive for arthralgias (Left knee). Negative for back pain, myalgias and neck pain.  Neurological:  Negative for dizziness and headaches.  Hematological:  Does not bruise/bleed easily.  Psychiatric/Behavioral:  The patient is not nervous/anxious.    Blood pressure (!) 135/51, pulse 99, temperature 98.4 F (36.9 C), temperature source Oral, resp. rate 19, height 5\' 3"  (1.6 m), weight 108 kg, SpO2 97%. Physical Exam Constitutional:      General: She is not in acute distress.    Appearance: She is well-developed. She is not diaphoretic.  HENT:     Head: Normocephalic and atraumatic.  Eyes:     General: No scleral icterus.       Right eye: No discharge.        Left eye: No discharge.     Conjunctiva/sclera: Conjunctivae normal.  Cardiovascular:     Rate and Rhythm: Normal rate and regular rhythm.  Pulmonary:     Effort: Pulmonary effort is normal. No respiratory distress.  Musculoskeletal:     Cervical back: Normal range of motion.     Comments: LLE No traumatic wounds, ecchymosis, or  rash  Severe TTP knee  No ankle effusion  Sens DPN, SPN, TN intact  Motor EHL, ext, flex, evers 5/5  DP 1+, PT 1+, No significant edema  Skin:    General: Skin is warm and dry.  Neurological:     Mental Status: She is alert.  Psychiatric:        Mood and Affect: Mood normal.        Behavior: Behavior normal.     Assessment/Plan: Left distal femur fx -- Plan ORIF today with Dr. Jena Gauss. Please keep NPO. Multiple medical problems including chronic diastolic CHF, essential hypertension, chronic blood loss anemia requiring blood transfusion, and morbid obesity -- per primary service    Freeman Caldron, PA-C Orthopedic Surgery [phone] 04/05/2023, 9:10 AM

## 2023-04-05 NOTE — Consult Note (Signed)
Reason for Consult:Left distal femur fx Referring Physician: Floyde Parkins Time called: [id number] Time at bedside: 0858   Chelsea Keller is an 88 y.o. female.  HPI: Chelsea Keller's knees gave way yesterday and she fell. She had immediate left knee pain and could not get up. She was brought to the ED where x-rays showed a distal femur fx and orthopedic surgery was consulted.Due to the complexity of the fx orthopedic trauma consultation was requested.  Past Medical History:  Diagnosis Date   Allergy    Anemia    Anxiety    Arthritis    Arthritis of sacroiliac joint of both sides 11/12/2017   Bilateral pulmonary embolism (HCC) 06/23/2017   Chronic diastolic CHF (congestive heart failure) (HCC)    a. Echo 1/16:  mild LVH, EF normal, grade 1 DD, MAC   Chronic venous insufficiency    chronic LE edema   DDD (degenerative disc disease), lumbar 11/12/2017   Degenerative joint disease (DJD) of hip, Bilateral  11/12/2017   Depression    Fibromyalgia    constant pain   Hx of cardiac catheterization    a. LHC in [place] "ok" per patient with mild plaque in a single vessel - records not available   Hx of cardiovascular stress test    a. Nuclear study in 2008 normal   Hx of colonic polyps    Hypertension    Hypertriglyceridemia    Impaired fasting glucose    PONV (postoperative nausea and vomiting)    PUD (peptic ulcer disease)    hx of gastric ulcer   Pulmonary emboli (HCC) 06/2017   Vitamin B12 deficiency     Past Surgical History:  Procedure Laterality Date   ABDOMINAL HYSTERECTOMY     CATARACT EXTRACTION  10/2003   OD   CHOLECYSTECTOMY     COLONOSCOPY W/ POLYPECTOMY     COLONOSCOPY WITH PROPOFOL N/A 10/15/2014   Procedure: COLONOSCOPY WITH PROPOFOL;  Surgeon: Meryl Dare, MD;  Location: [facility] ENDOSCOPY;  Service: Endoscopy;  Laterality: N/A;   COLONOSCOPY WITH PROPOFOL N/A 03/23/2018   Procedure: COLONOSCOPY WITH PROPOFOL;  Surgeon: Iva Boop, MD;  Location: [facility] ENDOSCOPY;  Service: Endoscopy;   Laterality: N/A;   ESOPHAGOGASTRODUODENOSCOPY (EGD) WITH PROPOFOL N/A 10/15/2014   Procedure: ESOPHAGOGASTRODUODENOSCOPY (EGD) WITH PROPOFOL;  Surgeon: Meryl Dare, MD;  Location: [facility] ENDOSCOPY;  Service: Endoscopy;  Laterality: N/A;   EXTERNAL FIXATION ANKLE FRACTURE     Fx.  left ankle-fixation with pins later removed sec to infection 1985   EXTERNAL FIXATION WRIST FRACTURE  1985   left with pins   EYE SURGERY     FEMUR FRACTURE SURGERY  06/2009   FRACTURE SURGERY     HARDWARE REMOVAL Left 10/05/2013   Procedure: HARDWARE REMOVAL LEFT DISTAL FEMUR;  Surgeon: Budd Palmer, MD;  Location: [facility] OR;  Service: Orthopedics;  Laterality: Left;   HEMIARTHROPLASTY SHOULDER FRACTURE  06/2009   HOT HEMOSTASIS N/A 03/23/2018   Procedure: HOT HEMOSTASIS (ARGON PLASMA COAGULATION/BICAP);  Surgeon: Iva Boop, MD;  Location: [facility] ENDOSCOPY;  Service: Endoscopy;  Laterality: N/A;   JOINT REPLACEMENT     STERIOD INJECTION Right 10/05/2013   Procedure: STEROID INJECTION;  Surgeon: Budd Palmer, MD;  Location: [facility] OR;  Service: Orthopedics;  Laterality: Right;   TONSILLECTOMY     TONSILLECTOMY     TOTAL KNEE ARTHROPLASTY  03/09   left    Family History  Problem Relation Age of Onset   Depression Mother    Cancer Mother  uterine cancer   Heart attack Father    Cancer Brother        prostate cancer   Diabetes Maternal Aunt    Arthritis Brother    Asthma Brother    Stroke Maternal Grandmother    Pulmonary embolism Daughter     Social History:  reports that she quit smoking about 29 years ago. Her smoking use included cigarettes. She started smoking about 69 years ago. She has a 40 pack-year smoking history. She has been exposed to tobacco smoke. She has never used smokeless tobacco. She reports that she does not drink alcohol and does not use drugs.  Allergies:  Allergies  Allergen Reactions   Codeine Sulfate Shortness Of Breath   Celecoxib Other (See Comments)    Caused vaginal  bleeding   Erythromycin Base Nausea And Vomiting   Spironolactone Nausea Only and Other (See Comments)    Blurred vision, Upset stomach, lethargy    Medications: I have reviewed the patient's current medications.  Results for orders placed or performed during the hospital encounter of 04/03/23 (from the past 48 hour(s))  CBC with Differential     Status: Abnormal   Collection Time: 04/03/23  3:13 PM  Result Value Ref Range   WBC 14.3 (H) 4.0 - 10.5 K/uL   RBC 3.57 (L) 3.87 - 5.11 MIL/uL   Hemoglobin 9.6 (L) 12.0 - 15.0 g/dL   HCT 41.3 (L) 24.4 - 01.0 %   MCV 89.4 80.0 - 100.0 fL   MCH 26.9 26.0 - 34.0 pg   MCHC 30.1 30.0 - 36.0 g/dL   RDW 27.2 (H) 53.6 - 64.4 %   Platelets 273 150 - 400 K/uL   nRBC 0.4 (H) 0.0 - 0.2 %   Neutrophils Relative % 86 %   Neutro Abs 12.2 (H) 1.7 - 7.7 K/uL   Lymphocytes Relative 4 %   Lymphs Abs 0.6 (L) 0.7 - 4.0 K/uL   Monocytes Relative 8 %   Monocytes Absolute 1.2 (H) 0.1 - 1.0 K/uL   Eosinophils Relative 0 %   Eosinophils Absolute 0.0 0.0 - 0.5 K/uL   Basophils Relative 0 %   Basophils Absolute 0.1 0.0 - 0.1 K/uL   Immature Granulocytes 2 %   Abs Immature Granulocytes 0.25 (H) 0.00 - 0.07 K/uL   Polychromasia PRESENT    Ovalocytes PRESENT     Comment: Performed at [facility], [address], [place], [place] [address]  Basic metabolic panel     Status: Abnormal   Collection Time: 04/03/23  3:13 PM  Result Value Ref Range   Sodium 136 135 - 145 mmol/L   Potassium 4.1 3.5 - 5.1 mmol/L   Chloride 102 98 - 111 mmol/L   CO2 25 22 - 32 mmol/L   Glucose, Bld 118 (H) 70 - 99 mg/dL    Comment: Glucose reference range applies only to samples taken after fasting for at least 8 hours.   BUN 27 (H) 8 - 23 mg/dL   Creatinine, Ser 2.59 (H) 0.44 - 1.00 mg/dL   Calcium 8.5 (L) 8.9 - 10.3 mg/dL   GFR, Estimated 40 (L) >60 mL/min    Comment: (NOTE) Calculated using the CKD-EPI Creatinine Equation (2021)    Anion gap 9 5 - 15     Comment: Performed at [facility], [address] [place], [place] [address]  Blood culture (routine x 2)     Status: None (Preliminary result)   Collection Time: 04/03/23  6:22 PM   Specimen: BLOOD  Result Value Ref Range   Specimen Description      BLOOD LEFT ANTECUBITAL Performed at [facility], [address], [place], [place] [address]    Special Requests      BOTTLES DRAWN AEROBIC AND ANAEROBIC Blood Culture adequate volume Performed at [facility], [address], [place], [place] [address]    Culture      NO GROWTH 2 DAYS Performed at [facility], [address] [place], [place] [address]    Report Status PENDING   Blood culture (routine x 2)     Status: None (Preliminary result)   Collection Time: 04/03/23  6:37 PM   Specimen: BLOOD  Result Value Ref Range   Specimen Description      BLOOD RIGHT ANTECUBITAL Performed at [facility], [address], [place], [place] [address]    Special Requests      BOTTLES DRAWN AEROBIC AND ANAEROBIC Blood Culture adequate volume Performed at [facility], [address] [place], [place] [address]    Culture      NO GROWTH 2 DAYS Performed at [facility], [address], [place], [place] [address]    Report Status PENDING   Comprehensive metabolic panel     Status: Abnormal   Collection Time: 04/04/23  4:31 AM  Result Value Ref Range   Sodium 134 (L) 135 - 145 mmol/L   Potassium 5.3 (H) 3.5 - 5.1 mmol/L   Chloride 102 98 - 111 mmol/L   CO2 24 22 - 32 mmol/L   Glucose, Bld 117 (H) 70 - 99 mg/dL    Comment: Glucose reference range applies only to samples taken after fasting for at least 8 hours.   BUN 26 (H) 8 - 23 mg/dL   Creatinine, Ser 5.78 (H) 0.44 - 1.00 mg/dL   Calcium 8.2 (L) 8.9 - 10.3 mg/dL   Total Protein 4.1 (L) 6.5 - 8.1 g/dL   Albumin 2.4 (L) 3.5 - 5.0 g/dL   AST 63 (H) 15 - 41 U/L   ALT 45 (H) 0 - 44 U/L   Alkaline  Phosphatase 164 (H) 38 - 126 U/L   Total Bilirubin 2.4 (H) 0.3 - 1.2 mg/dL   GFR, Estimated 33 (L) >60 mL/min    Comment: (NOTE) Calculated using the CKD-EPI Creatinine Equation (2021)    Anion gap 8 5 - 15    Comment: Performed at [facility], [address], [place], [place] [address]  CBC     Status: Abnormal   Collection Time: 04/04/23  4:31 AM  Result Value Ref Range   WBC 15.3 (H) 4.0 - 10.5 K/uL   RBC 2.97 (L) 3.87 - 5.11 MIL/uL   Hemoglobin 8.2 (L) 12.0 - 15.0 g/dL   HCT 95.2 (L) 84.1 - 32.4 %   MCV 90.6 80.0 - 100.0 fL   MCH 27.6 26.0 - 34.0 pg   MCHC 30.5 30.0 - 36.0 g/dL   RDW 40.1 (H) 02.7 - 25.3 %   Platelets 303 150 - 400 K/uL   nRBC 0.7 (H) 0.0 - 0.2 %    Comment: Performed at [facility], [address], [place], [place] [address]  Magnesium     Status: Abnormal   Collection Time: 04/04/23  4:31 AM  Result Value Ref Range   Magnesium 1.4 (L) 1.7 - 2.4 mg/dL    Comment: Performed at  [facility], [address] [place], [place] [address]  Phosphorus     Status: Abnormal   Collection Time: 04/04/23  4:31 AM  Result Value Ref Range   Phosphorus 5.3 (H) 2.5 - 4.6 mg/dL    Comment: Performed at [facility], [address], [place], [place] [address]  Potassium     Status: Abnormal   Collection Time: 04/04/23 11:05 AM  Result Value Ref Range   Potassium 5.5 (H) 3.5 - 5.1 mmol/L    Comment: Performed at [facility], [address], [place], [place] [address]  CBC     Status: Abnormal   Collection Time: 04/05/23  4:01 AM  Result Value Ref Range   WBC 21.3 (H) 4.0 - 10.5 K/uL   RBC 2.82 (L) 3.87 - 5.11 MIL/uL   Hemoglobin 7.9 (L) 12.0 - 15.0 g/dL   HCT 28.4 (L) 13.2 - 44.0 %   MCV 96.5 80.0 - 100.0 fL   MCH 28.0 26.0 - 34.0 pg   MCHC 29.0 (L) 30.0 - 36.0 g/dL   RDW 10.2 (H) 72.5 - 36.6 %   Platelets 235 150 - 400 K/uL   nRBC 0.7 (H) 0.0 - 0.2 %    Comment: Performed at [facility], [address], [place], [place]  [address]  Magnesium     Status: None   Collection Time: 04/05/23  4:01 AM  Result Value Ref Range   Magnesium 2.0 1.7 - 2.4 mg/dL    Comment: Performed at [facility], [address] [place], [place] [address]  Basic metabolic panel     Status: Abnormal   Collection Time: 04/05/23  4:01 AM  Result Value Ref Range   Sodium 132 (L) 135 - 145 mmol/L   Potassium 4.6 3.5 - 5.1 mmol/L   Chloride 99 98 - 111 mmol/L   CO2 24 22 - 32 mmol/L   Glucose, Bld 107 (H) 70 - 99 mg/dL    Comment: Glucose reference range applies only to samples taken after fasting for at least 8 hours.   BUN 34 (H) 8 - 23 mg/dL   Creatinine, Ser 5.63 (H) 0.44 - 1.00 mg/dL   Calcium 8.1 (L) 8.9 - 10.3 mg/dL   GFR, Estimated 34 (L) >60 mL/min    Comment: (NOTE) Calculated using the CKD-EPI Creatinine Equation (2021)    Anion gap 9 5 - 15    Comment: Performed at [facility], [address], [place], [place] [address]  Phosphorus     Status: None   Collection Time: 04/05/23  4:01 AM  Result Value Ref Range   Phosphorus 4.0 2.5 - 4.6 mg/dL    Comment: Performed at [facility], [address], [place], [place] [address]    DG Foot 2 Views Right  Result Date: 04/03/2023 CLINICAL DATA:  Fall, right leg pain, foot pain EXAM: RIGHT FOOT - 2 VIEW COMPARISON:  None Available. FINDINGS: No acute bony abnormality. Specifically, no fracture, subluxation, or dislocation. Diffuse osteopenia. Vascular and soft tissue calcifications. IMPRESSION: No acute bony abnormality. Electronically Signed   By: Charlett Nose M.D.   On: 04/03/2023 20:56   CT CHEST ABDOMEN PELVIS W CONTRAST  Result Date: 04/03/2023 CLINICAL DATA:  Patient fell. Poly trauma, blunt. Chronic anticoagulation. EXAM: CT CHEST, ABDOMEN, AND PELVIS WITH CONTRAST TECHNIQUE: Multidetector CT imaging of the chest, abdomen and pelvis was performed following the standard protocol during bolus administration of intravenous  contrast. RADIATION DOSE REDUCTION:  This exam was performed according to the departmental dose-optimization program which includes automated exposure control, adjustment of the mA and/or kV according to patient size and/or use of iterative reconstruction technique. CONTRAST:  OMNIPAQUE IOHEXOL 300 MG/ML  SOLN COMPARISON:  Abdomen and pelvis CT 03/17/2023 FINDINGS: CT CHEST FINDINGS Cardiovascular: The heart size is normal. No substantial pericardial effusion. Coronary artery calcification is evident. Moderate atherosclerotic calcification is noted in the wall of the thoracic aorta. Enlargement of the pulmonary outflow tract/main pulmonary arteries suggests pulmonary arterial hypertension. Mediastinum/Nodes: No mediastinal lymphadenopathy. There is no hilar lymphadenopathy. The esophagus has normal imaging features. There is no axillary lymphadenopathy. Lungs/Pleura: No suspicious pulmonary nodule or mass. No focal airspace consolidation. No pleural effusion. Musculoskeletal: No worrisome lytic or sclerotic osseous abnormality. Chronic posttraumatic/degenerative deformity noted right shoulder. No sternal fracture. No evidence for thoracic spine fracture. CT ABDOMEN PELVIS FINDINGS Hepatobiliary: No suspicious focal abnormality within the liver parenchyma. Gallbladder is surgically absent. No intrahepatic or extrahepatic biliary dilation. Pancreas: No focal mass lesion. No dilatation of the main duct. No intraparenchymal cyst. No peripancreatic edema. Spleen: No splenomegaly. No suspicious focal mass lesion. Adrenals/Urinary Tract: Small left adrenal nodule stable since 04/11/2018 consistent with benign etiology such as adenoma. No followup imaging is recommended. Vascular calcification seen in the hilum of each kidney. No suspicious renal mass lesion. Small cyst noted lower pole left kidney. No followup imaging is recommended. No evidence for hydroureter. The urinary bladder appears normal for the degree of distention. Stomach/Bowel: Stomach is  unremarkable. No gastric wall thickening. No evidence of outlet obstruction. Duodenum is normally positioned as is the ligament of Treitz. Portions of the small bowel and right colon have been excluded from the field of view. Within this limitation there is no small bowel dilatation to suggest obstruction. No colonic dilatation. Diverticular changes are noted in the left colon without evidence of diverticulitis. There is some subtle pericolonic edema/inflammation along the distal descending colon (axial image 71/series 4) suggesting diverticulitis Vascular/Lymphatic: There is moderate atherosclerotic calcification of the abdominal aorta without aneurysm. There is no gastrohepatic or hepatoduodenal ligament lymphadenopathy. No retroperitoneal or mesenteric lymphadenopathy. No pelvic sidewall lymphadenopathy. Reproductive: Hysterectomy.  There is no adnexal mass. Other: No intraperitoneal free fluid. Musculoskeletal: No worrisome lytic or sclerotic osseous abnormality. L4 superior endplate compression deformity is stable since 03/17/2023. IMPRESSION: 1. No evidence for acute traumatic injury in the chest, abdomen, or pelvis. 2. Subtle pericolonic edema/inflammation along the distal descending colon suggesting diverticulitis. To suggest perforation or abscess. 3. Enlargement of the pulmonary outflow tract/main pulmonary arteries suggests pulmonary arterial hypertension. 4.  Aortic Atherosclerosis (ICD10-I70.0). No findings Electronically Signed   By: Kennith Center M.D.   On: 04/03/2023 17:30   CT HEAD WO CONTRAST ( )  Result Date: 04/03/2023 CLINICAL DATA:  Head trauma.  Fall.  Patient on blood thinner. EXAM: CT HEAD WITHOUT CONTRAST TECHNIQUE: Contiguous axial images were obtained from the base of the skull through the vertex without intravenous contrast. RADIATION DOSE REDUCTION: This exam was performed according to the departmental dose-optimization program which includes automated exposure control, adjustment  of the mA and/or kV according to patient size and/or use of iterative reconstruction technique. COMPARISON:  Head CT dated 07/17/2016. FINDINGS: Brain: Mild age-related atrophy and chronic microvascular ischemic changes. There is no acute intracranial hemorrhage. No mass effect or midline shift. No extra-axial fluid collection. Vascular: No hyperdense vessel or unexpected calcification. Skull: Normal. Negative for fracture or focal lesion. Sinuses/Orbits: No acute finding.  Other: None IMPRESSION: 1. No acute intracranial pathology. 2. Mild age-related atrophy and chronic microvascular ischemic changes. Electronically Signed   By: Elgie Collard M.D.   On: 04/03/2023 17:27   CT CERVICAL SPINE WO CONTRAST  Result Date: 04/03/2023 CLINICAL DATA:  Fall.  Polytrauma, blunt EXAM: CT CERVICAL SPINE WITHOUT CONTRAST TECHNIQUE: Multidetector CT imaging of the cervical spine was performed without intravenous contrast. Multiplanar CT image reconstructions were also generated. RADIATION DOSE REDUCTION: This exam was performed according to the departmental dose-optimization program which includes automated exposure control, adjustment of the mA and/or kV according to patient size and/or use of iterative reconstruction technique. COMPARISON:  None Available. FINDINGS: Alignment: 3 mm degenerative anterolisthesis of C3 on C4. Skull base and vertebrae: Diffuse osteopenia. No fracture or focal bone lesion. Soft tissues and spinal canal: No prevertebral fluid or swelling. No visible canal hematoma. Disc levels: Advanced diffuse degenerative facet disease bilaterally. Mild degenerative disc disease with disc space narrowing and anterior spurring. No visible disc herniation. Upper chest: No acute findings Other: None IMPRESSION: Moderate to advanced multilevel degenerative spondylosis. No acute bony abnormality. Electronically Signed   By: Charlett Nose M.D.   On: 04/03/2023 17:22   DG Knee 2 Views Left  Result Date:  04/03/2023 CLINICAL DATA:  Fall.  Pain. EXAM: LEFT KNEE - 1-2 VIEW COMPARISON:  None Available. FINDINGS: Status post tricompartmental knee replacement. Acute comminuted fracture of the distal femoral metaphysis identified at the level of the femoral component. There is approximately 1/2 shaft with the posterior displacement of the femoral component of the knee replacement relative to the metaphyseal shaft. IMPRESSION: Acute comminuted fracture of the distal femoral metaphysis at the level of the femoral component of the knee replacement. Electronically Signed   By: Kennith Center M.D.   On: 04/03/2023 15:17   DG FEMUR, MIN 2 VIEWS RIGHT  Result Date: 04/03/2023 CLINICAL DATA:  Fall with pain. EXAM: RIGHT FEMUR 2 VIEWS COMPARISON:  None Available. FINDINGS: Study limited by osteopenia and body habitus. No displaced fracture of the femoral neck evident. Degenerative changes are seen in all 3 compartments of the knee with small joint effusion. IMPRESSION: 1. Limited study due to osteopenia and body habitus. No displaced fracture evident. 2. Degenerative changes in the right knee with small joint effusion. Electronically Signed   By: Kennith Center M.D.   On: 04/03/2023 15:16   DG Hip Unilat W or Wo Pelvis 2-3 Views Left  Result Date: 04/03/2023 CLINICAL DATA:  Fall.  Left leg pain. EXAM: DG HIP (WITH OR WITHOUT PELVIS) 2-3V LEFT COMPARISON:  04/03/2017 FINDINGS: Bones are diffusely demineralized. No evidence for pubic ramus or sacral fracture. AP and cross-table lateral views of the left hip show no evidence for a displaced left femoral neck fracture although assessment is limited by osteopenia and body habitus. IMPRESSION: No evidence for displaced left femoral neck fracture although assessment is limited by osteopenia and body habitus. If there is high clinical concern for occult fracture, CT recommended to further evaluate. Electronically Signed   By: Kennith Center M.D.   On: 04/03/2023 15:15   DG Chest  Portable 1 View  Result Date: 04/03/2023 CLINICAL DATA:  Fall.  Pain everywhere. EXAM: PORTABLE CHEST 1 VIEW COMPARISON:  11/12/2022 FINDINGS: The cardio pericardial silhouette is enlarged. Mild vascular congestion without edema. The lungs are clear without focal pneumonia, edema, pneumothorax or pleural effusion. No acute bony abnormality. IMPRESSION: Enlargement of the cardiopericardial silhouette with mild vascular congestion. Electronically Signed   By:  Kennith Center M.D.   On: 04/03/2023 15:14    Review of Systems  HENT:  Negative for ear discharge, ear pain, hearing loss and tinnitus.   Eyes:  Negative for photophobia and pain.  Respiratory:  Negative for cough and shortness of breath.   Cardiovascular:  Negative for chest pain.  Gastrointestinal:  Negative for abdominal pain, nausea and vomiting.  Genitourinary:  Negative for dysuria, flank pain, frequency and urgency.  Musculoskeletal:  Positive for arthralgias (Left knee). Negative for back pain, myalgias and neck pain.  Neurological:  Negative for dizziness and headaches.  Hematological:  Does not bruise/bleed easily.  Psychiatric/Behavioral:  The patient is not nervous/anxious.    Blood pressure (!) 135/51, pulse 99, temperature 98.4 F (36.9 C), temperature source Oral, resp. rate 19, height 5\' 3"  (1.6 m), weight 108 kg, SpO2 97%. Physical Exam Constitutional:      General: She is not in acute distress.    Appearance: She is well-developed. She is not diaphoretic.  HENT:     Head: Normocephalic and atraumatic.  Eyes:     General: No scleral icterus.       Right eye: No discharge.        Left eye: No discharge.     Conjunctiva/sclera: Conjunctivae normal.  Cardiovascular:     Rate and Rhythm: Normal rate and regular rhythm.  Pulmonary:     Effort: Pulmonary effort is normal. No respiratory distress.  Musculoskeletal:     Cervical back: Normal range of motion.     Comments: LLE No traumatic wounds, ecchymosis, or  rash  Severe TTP knee  No ankle effusion  Sens DPN, SPN, TN intact  Motor EHL, ext, flex, evers 5/5  DP 1+, PT 1+, No significant edema  Skin:    General: Skin is warm and dry.  Neurological:     Mental Status: She is alert.  Psychiatric:        Mood and Affect: Mood normal.        Behavior: Behavior normal.     Assessment/Plan: Left distal femur fx -- Plan ORIF today with Dr. Jena Gauss. Please keep NPO. Multiple medical problems including chronic diastolic CHF, essential hypertension, chronic blood loss anemia requiring blood transfusion, and morbid obesity -- per primary service    Freeman Caldron, PA-C Orthopedic Surgery [phone] 04/05/2023, 9:10 AM

## 2023-04-05 NOTE — Consult Note (Signed)
ORTHOPAEDIC CONSULTATION  REQUESTING PHYSICIAN: Willeen Niece, MD  Chief Complaint: leftt distal femur periprosthetic fracture  HPI: Chelsea Keller is a 88 y.o. female with  Taygan is an 88 year old female s/p fall on 04/03/23. She was found to have a periprosthetic leftt distal femur fracture. She was also experiencing and acute bout of diverticulitis and started on IV abx. She was sent to [facility] hospital for further care. . She is also on eliquis for previous blood clots. Past Medical History:  Diagnosis Date   Allergy    Anemia    Anxiety    Arthritis    Arthritis of sacroiliac joint of both sides 11/12/2017   Bilateral pulmonary embolism (HCC) 06/23/2017   Chronic diastolic CHF (congestive heart failure) (HCC)    a. Echo 1/16:  mild LVH, EF normal, grade 1 DD, MAC   Chronic venous insufficiency    chronic LE edema   DDD (degenerative disc disease), lumbar 11/12/2017   Degenerative joint disease (DJD) of hip, Bilateral  11/12/2017   Depression    Fibromyalgia    constant pain   Hx of cardiac catheterization    a. LHC in [place] "ok" per patient with mild plaque in a single vessel - records not available   Hx of cardiovascular stress test    a. Nuclear study in 2008 normal   Hx of colonic polyps    Hypertension    Hypertriglyceridemia    Impaired fasting glucose    PONV (postoperative nausea and vomiting)    PUD (peptic ulcer disease)    hx of gastric ulcer   Pulmonary emboli (HCC) 06/2017   Vitamin B12 deficiency    Past Surgical History:  Procedure Laterality Date   ABDOMINAL HYSTERECTOMY     CATARACT EXTRACTION  10/2003   OD   CHOLECYSTECTOMY     COLONOSCOPY W/ POLYPECTOMY     COLONOSCOPY WITH PROPOFOL N/A 10/15/2014   Procedure: COLONOSCOPY WITH PROPOFOL;  Surgeon: Meryl Dare, MD;  Location: [facility] ENDOSCOPY;  Service: Endoscopy;  Laterality: N/A;   COLONOSCOPY WITH PROPOFOL N/A 03/23/2018   Procedure: COLONOSCOPY WITH PROPOFOL;  Surgeon: Iva Boop, MD;   Location: [facility] ENDOSCOPY;  Service: Endoscopy;  Laterality: N/A;   ESOPHAGOGASTRODUODENOSCOPY (EGD) WITH PROPOFOL N/A 10/15/2014   Procedure: ESOPHAGOGASTRODUODENOSCOPY (EGD) WITH PROPOFOL;  Surgeon: Meryl Dare, MD;  Location: [facility] ENDOSCOPY;  Service: Endoscopy;  Laterality: N/A;   EXTERNAL FIXATION ANKLE FRACTURE     Fx.  left ankle-fixation with pins later removed sec to infection 1985   EXTERNAL FIXATION WRIST FRACTURE  1985   left with pins   EYE SURGERY     FEMUR FRACTURE SURGERY  06/2009   FRACTURE SURGERY     HARDWARE REMOVAL Left 10/05/2013   Procedure: HARDWARE REMOVAL LEFT DISTAL FEMUR;  Surgeon: Budd Palmer, MD;  Location: [facility] OR;  Service: Orthopedics;  Laterality: Left;   HEMIARTHROPLASTY SHOULDER FRACTURE  06/2009   HOT HEMOSTASIS N/A 03/23/2018   Procedure: HOT HEMOSTASIS (ARGON PLASMA COAGULATION/BICAP);  Surgeon: Iva Boop, MD;  Location: [facility] ENDOSCOPY;  Service: Endoscopy;  Laterality: N/A;   JOINT REPLACEMENT     STERIOD INJECTION Right 10/05/2013   Procedure: STEROID INJECTION;  Surgeon: Budd Palmer, MD;  Location: [facility] OR;  Service: Orthopedics;  Laterality: Right;   TONSILLECTOMY     TONSILLECTOMY     TOTAL KNEE ARTHROPLASTY  03/09   left   Social History   Socioeconomic History   Marital status: Widowed  Spouse name: Not on file   Number of children: 4   Years of education: Not on file   Highest education level: Not on file  Occupational History   Occupation: retired crossing guard   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  Tobacco Use   Smoking status: Former    Current packs/day: 0.00    Average packs/day: 1 pack/day for 40.0 years (40.0 ttl pk-yrs)    Types: Cigarettes    Start date: 07/06/1953    Quit date: 07/06/1993    Years since quitting: 29.7    Passive exposure: Past   Smokeless tobacco: Never  Vaping Use   Vaping status: Never Used  Substance and Sexual Activity   Alcohol use: No    Alcohol/week: 0.0 standard drinks of alcohol    Drug use: No   Sexual activity: Not Currently  Other Topics Concern   Not on file  Social History Narrative   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[employer], lives w/ daughter and adult grandsons   Former smoker, no EtOH      No living will   Plans to do health care POA forms---wants daughter Arline Asp   Would accept resuscitation attempts---no prolonged ventilation   Absolutely no feeding tube   Social Determinants of Health   Financial Resource Strain: Not on file  Food Insecurity: No Food Insecurity (11/12/2022)   Hunger Vital Sign    Worried About Running Out of Food in the Last Year: Never true    Ran Out of Food in the Last Year: Never true  Transportation Needs: No Transportation Needs (11/12/2022)   PRAPARE - Administrator, Civil Service (Medical): No    Lack of Transportation (Non-Medical): No  Physical Activity: Not on file  Stress: Not on file  Social Connections: Not on file   Family History  Problem Relation Age of Onset   Depression Mother    Cancer Mother        uterine cancer   Heart attack Father    Cancer Brother        prostate cancer   Diabetes Maternal Aunt    Arthritis Brother    Asthma Brother    Stroke Maternal Grandmother    Pulmonary embolism Daughter    Allergies  Allergen Reactions   Codeine Sulfate Shortness Of Breath   Celecoxib Other (See Comments)    Caused vaginal bleeding   Erythromycin Base Nausea And Vomiting   Spironolactone Nausea Only and Other (See Comments)    Blurred vision, Upset stomach, lethargy   Prior to Admission medications   Medication Sig Start Date End Date Taking? Authorizing Provider  acetaminophen (TYLENOL) 500 MG tablet Take 1,500 mg by mouth every 12 (twelve) hours as needed (for pain- with each dose of Tramadol).   Yes [provider]  allopurinol (ZYLOPRIM) 100 MG tablet TAKE 1 TABLET BY MOUTH EVERY DAY 01/11/23  Yes Tillman Abide I, MD  allopurinol (ZYLOPRIM) 300  MG tablet TAKE 1 TABLET BY MOUTH EVERY DAY 01/11/23  Yes [name], MD  colchicine 0.6 MG tablet Take 1 tablet (0.6 mg total) by mouth 2 (two) times daily. 12/08/22  Yes [name], MD  ELIQUIS 2.5 MG TABS tablet TAKE 1 TABLET TWICE A DAY 01/21/23  Yes [name], MD  fluconazole (DIFLUCAN) 150 MG tablet Take 1 tablet (150 mg total) by mouth once a week. 03/31/23  Yes [name], MD  gabapentin (NEURONTIN) 300 MG capsule TAKE 1 CAPSULE IN THE MORNING AND 2 CAPSULES AT BEDTIME Patient taking differently: Take 300-600 mg by mouth See admin instructions. Take 300 mg by mouth in the morning and 600 mg with supper 07/27/22  Yes Tillman Abide I, MD  hydrOXYzine (ATARAX) 10 MG tablet TAKE 1 TABLET BY MOUTH THREE TIMES A DAY AS NEEDED 02/24/23  Yes [name], MD  KLOR-CON M20 20 MEQ tablet TAKE 2 TABLETS (40 MEQ TOTAL) DAILY Patient taking differently: Take 40 mEq by mouth in the morning. 04/28/22  Yes [name], MD  ondansetron (ZOFRAN) 4 MG tablet Take 1 tablet (4 mg total) by mouth every 8 (eight) hours as needed for nausea or vomiting. 03/15/23  Yes [name], MD  torsemide (DEMADEX) 20 MG tablet Take 20 mg by mouth daily as needed (for fluid).   Yes [provider]  traMADol (ULTRAM) 50 MG tablet Take 1 tablet (50 mg total) by mouth 3 (three) times daily as needed for moderate pain. 03/19/23  Yes [name], MD  triamterene-hydrochlorothiazide (MAXZIDE-25) 37.5-25 MG tablet TAKE 1 TABLET DAILY 03/25/23  Yes [name], MD  famotidine (PEPCID) 40 MG tablet Take 1 tablet (40 mg total) by mouth at bedtime. Patient not taking: Reported on 04/03/2023 02/01/23   [name], MD   Imaging: DG Foot 2 Views Right  Result Date: 04/03/2023 CLINICAL DATA:  Fall, right leg pain, foot pain EXAM: RIGHT FOOT - 2 VIEW COMPARISON:  None Available. FINDINGS: No acute bony abnormality. Specifically, no fracture, subluxation, or dislocation. Diffuse  osteopenia. Vascular and soft tissue calcifications. IMPRESSION: No acute bony abnormality. Electronically Signed   By: Charlett Nose M.D.   On: 04/03/2023 20:56   CT CHEST ABDOMEN PELVIS W CONTRAST  Result Date: 04/03/2023 CLINICAL DATA:  Patient fell. Poly trauma, blunt. Chronic anticoagulation. EXAM: CT CHEST, ABDOMEN, AND PELVIS WITH CONTRAST TECHNIQUE: Multidetector CT imaging of the chest, abdomen and pelvis was performed following the standard protocol during bolus administration of intravenous contrast. RADIATION DOSE REDUCTION: This exam was performed according to the departmental dose-optimization program which includes automated exposure control, adjustment of the mA and/or kV according to patient size and/or use of iterative reconstruction technique. CONTRAST:  OMNIPAQUE IOHEXOL 300 MG/ML  SOLN COMPARISON:  Abdomen and pelvis CT 03/17/2023 FINDINGS: CT CHEST FINDINGS Cardiovascular: The heart size is normal. No substantial pericardial effusion. Coronary artery calcification is evident. Moderate atherosclerotic calcification is noted in the wall of the thoracic aorta. Enlargement of the pulmonary outflow tract/main pulmonary arteries suggests pulmonary arterial hypertension. Mediastinum/Nodes: No mediastinal lymphadenopathy. There is no hilar lymphadenopathy. The esophagus has normal imaging features. There is no axillary lymphadenopathy. Lungs/Pleura: No suspicious pulmonary nodule or mass. No focal airspace consolidation. No pleural effusion. Musculoskeletal: No worrisome lytic or sclerotic osseous abnormality. Chronic posttraumatic/degenerative deformity noted right shoulder. No sternal fracture. No evidence for thoracic spine fracture. CT ABDOMEN PELVIS FINDINGS Hepatobiliary: No suspicious focal abnormality within the liver parenchyma. Gallbladder is surgically absent. No intrahepatic or extrahepatic biliary dilation. Pancreas: No focal mass lesion. No dilatation of the main duct. No  intraparenchymal cyst. No peripancreatic edema. Spleen: No splenomegaly. No suspicious focal mass lesion. Adrenals/Urinary Tract: Small left adrenal nodule stable since 04/11/2018 consistent with benign etiology such as adenoma. No followup imaging is recommended. Vascular calcification seen in the hilum of each kidney. No suspicious renal mass lesion. Small cyst noted lower pole left kidney. No followup imaging is recommended. No evidence  for hydroureter. The urinary bladder appears normal for the degree of distention. Stomach/Bowel: Stomach is unremarkable. No gastric wall thickening. No evidence of outlet obstruction. Duodenum is normally positioned as is the ligament of Treitz. Portions of the small bowel and right colon have been excluded from the field of view. Within this limitation there is no small bowel dilatation to suggest obstruction. No colonic dilatation. Diverticular changes are noted in the left colon without evidence of diverticulitis. There is some subtle pericolonic edema/inflammation along the distal descending colon (axial image 71/series 4) suggesting diverticulitis Vascular/Lymphatic: There is moderate atherosclerotic calcification of the abdominal aorta without aneurysm. There is no gastrohepatic or hepatoduodenal ligament lymphadenopathy. No retroperitoneal or mesenteric lymphadenopathy. No pelvic sidewall lymphadenopathy. Reproductive: Hysterectomy.  There is no adnexal mass. Other: No intraperitoneal free fluid. Musculoskeletal: No worrisome lytic or sclerotic osseous abnormality. L4 superior endplate compression deformity is stable since 03/17/2023. IMPRESSION: 1. No evidence for acute traumatic injury in the chest, abdomen, or pelvis. 2. Subtle pericolonic edema/inflammation along the distal descending colon suggesting diverticulitis. To suggest perforation or abscess. 3. Enlargement of the pulmonary outflow tract/main pulmonary arteries suggests pulmonary arterial hypertension. 4.   Aortic Atherosclerosis (ICD10-I70.0). No findings Electronically Signed   By: Kennith Center M.D.   On: 04/03/2023 17:30   CT HEAD WO CONTRAST ( )  Result Date: 04/03/2023 CLINICAL DATA:  Head trauma.  Fall.  Patient on blood thinner. EXAM: CT HEAD WITHOUT CONTRAST TECHNIQUE: Contiguous axial images were obtained from the base of the skull through the vertex without intravenous contrast. RADIATION DOSE REDUCTION: This exam was performed according to the departmental dose-optimization program which includes automated exposure control, adjustment of the mA and/or kV according to patient size and/or use of iterative reconstruction technique. COMPARISON:  Head CT dated 07/17/2016. FINDINGS: Brain: Mild age-related atrophy and chronic microvascular ischemic changes. There is no acute intracranial hemorrhage. No mass effect or midline shift. No extra-axial fluid collection. Vascular: No hyperdense vessel or unexpected calcification. Skull: Normal. Negative for fracture or focal lesion. Sinuses/Orbits: No acute finding. Other: None IMPRESSION: 1. No acute intracranial pathology. 2. Mild age-related atrophy and chronic microvascular ischemic changes. Electronically Signed   By: Elgie Collard M.D.   On: 04/03/2023 17:27   CT CERVICAL SPINE WO CONTRAST  Result Date: 04/03/2023 CLINICAL DATA:  Fall.  Polytrauma, blunt EXAM: CT CERVICAL SPINE WITHOUT CONTRAST TECHNIQUE: Multidetector CT imaging of the cervical spine was performed without intravenous contrast. Multiplanar CT image reconstructions were also generated. RADIATION DOSE REDUCTION: This exam was performed according to the departmental dose-optimization program which includes automated exposure control, adjustment of the mA and/or kV according to patient size and/or use of iterative reconstruction technique. COMPARISON:  None Available. FINDINGS: Alignment: 3 mm degenerative anterolisthesis of C3 on C4. Skull base and vertebrae: Diffuse osteopenia. No  fracture or focal bone lesion. Soft tissues and spinal canal: No prevertebral fluid or swelling. No visible canal hematoma. Disc levels: Advanced diffuse degenerative facet disease bilaterally. Mild degenerative disc disease with disc space narrowing and anterior spurring. No visible disc herniation. Upper chest: No acute findings Other: None IMPRESSION: Moderate to advanced multilevel degenerative spondylosis. No acute bony abnormality. Electronically Signed   By: Charlett Nose M.D.   On: 04/03/2023 17:22   DG Knee 2 Views Left  Result Date: 04/03/2023 CLINICAL DATA:  Fall.  Pain. EXAM: LEFT KNEE - 1-2 VIEW COMPARISON:  None Available. FINDINGS: Status post tricompartmental knee replacement. Acute comminuted fracture of the distal femoral metaphysis identified at the level  of the femoral component. There is approximately 1/2 shaft with the posterior displacement of the femoral component of the knee replacement relative to the metaphyseal shaft. IMPRESSION: Acute comminuted fracture of the distal femoral metaphysis at the level of the femoral component of the knee replacement. Electronically Signed   By: Kennith Center M.D.   On: 04/03/2023 15:17   DG FEMUR, MIN 2 VIEWS RIGHT  Result Date: 04/03/2023 CLINICAL DATA:  Fall with pain. EXAM: RIGHT FEMUR 2 VIEWS COMPARISON:  None Available. FINDINGS: Study limited by osteopenia and body habitus. No displaced fracture of the femoral neck evident. Degenerative changes are seen in all 3 compartments of the knee with small joint effusion. IMPRESSION: 1. Limited study due to osteopenia and body habitus. No displaced fracture evident. 2. Degenerative changes in the right knee with small joint effusion. Electronically Signed   By: Kennith Center M.D.   On: 04/03/2023 15:16   DG Hip Unilat W or Wo Pelvis 2-3 Views Left  Result Date: 04/03/2023 CLINICAL DATA:  Fall.  Left leg pain. EXAM: DG HIP (WITH OR WITHOUT PELVIS) 2-3V LEFT COMPARISON:  04/03/2017 FINDINGS: Bones  are diffusely demineralized. No evidence for pubic ramus or sacral fracture. AP and cross-table lateral views of the left hip show no evidence for a displaced left femoral neck fracture although assessment is limited by osteopenia and body habitus. IMPRESSION: No evidence for displaced left femoral neck fracture although assessment is limited by osteopenia and body habitus. If there is high clinical concern for occult fracture, CT recommended to further evaluate. Electronically Signed   By: Kennith Center M.D.   On: 04/03/2023 15:15   DG Chest Portable 1 View  Result Date: 04/03/2023 CLINICAL DATA:  Fall.  Pain everywhere. EXAM: PORTABLE CHEST 1 VIEW COMPARISON:  11/12/2022 FINDINGS: The cardio pericardial silhouette is enlarged. Mild vascular congestion without edema. The lungs are clear without focal pneumonia, edema, pneumothorax or pleural effusion. No acute bony abnormality. IMPRESSION: Enlargement of the cardiopericardial silhouette with mild vascular congestion. Electronically Signed   By: Kennith Center M.D.   On: 04/03/2023 15:14   Family History Reviewed and non-contributory, no pertinent history of problems with bleeding or anesthesia      Review of Systems 14 system ROS conducted and negative except for that noted in HPI   OBJECTIVE  Vitals:Patient Vitals for the past 8 hrs:  BP Temp Pulse Resp SpO2  04/05/23 0427 138/72 98.5 F (36.9 C) 99 18 96 %   General: Alert, no acute distress Cardiovascular: Warm extremities noted Respiratory: No cyanosis, no use of accessory musculature GI: No organomegaly, abdomen is soft and non-tender Skin: No lesions in the area of chief complaint other than those listed below in MSK exam.  Neurologic: Sensation intact distally save for the below mentioned MSK exam Psychiatric: Patient is competent for consent with normal mood and affect Lymphatic: No swelling obvious and reported other than the area involved in the exam below Extremities    LLE: Skin intact Obvious deformity of distal femur Previous incisions over right knee for TKA well healed Sensation intact distally Motor intact, moving toes 2+ DP, foot [employer]    Test Results  Labs cbc Recent Labs    04/04/23 0431 04/05/23 0401  WBC 15.3* 21.3*  HGB 8.2* 7.9*  HCT 26.9* 27.2*  PLT 303 235    Labs inflam No results for input(s): "CRP" in the last 72 hours.  Invalid input(s): "ESR"  Labs coag No results for input(s): "INR", "PTT" in  the last 72 hours.  Invalid input(s): "PT"  Recent Labs    04/04/23 0431 04/04/23 1105 04/05/23 0401  NA 134*  --  132*  K 5.3* 5.5* 4.6  CL 102  --  99  CO2 24  --  24  GLUCOSE 117*  --  107*  BUN 26*  --  34*  CREATININE 1.54*  --  1.49*  CALCIUM 8.2*  --  8.1*     ASSESSMENT AND PLAN: 88 y.o. female with the following: right distal femur   This patient requires inpatient admission to manage this problem appropriately.  She will require a ORIF left femur. Will discuss with the Orthopaedic trauma team for evaluation  Orthopedics recommends admission to a medical service and we will provide consultation and follow along  - Weight Bearing Status/Activity: NWB LLE  - Additional recommended labs/tests: None NPO  -VTE Prophylaxis: Hold Eliquies  - Pain control: Per medical team

## 2023-04-05 NOTE — Anesthesia Procedure Notes (Addendum)
Procedure Name: Intubation Date/Time: 04/05/2023 4:54 PM  Performed by: Ayesha Rumpf, CRNAPre-anesthesia Checklist: Patient identified, Emergency Drugs available, Suction available and Patient being monitored Patient Re-evaluated:Patient Re-evaluated prior to induction Oxygen Delivery Method: Circle System Utilized Preoxygenation: Pre-oxygenation with 100% oxygen Induction Type: IV induction Ventilation: Mask ventilation without difficulty Laryngoscope Size: Glidescope and 3 Grade View: Grade I Tube type: Oral Tube size: 7.0 mm Number of attempts: 1 Airway Equipment and Method: Stylet and Oral airway Placement Confirmation: ETT inserted through vocal cords under direct vision, positive ETCO2 and breath sounds checked- equal and bilateral Secured at: 20 cm Tube secured with: Tape Dental Injury: Teeth and Oropharynx as per pre-operative assessment

## 2023-04-05 NOTE — Anesthesia Preprocedure Evaluation (Addendum)
Anesthesia Evaluation  Patient identified by MRN, date of birth, ID band Patient awake    Reviewed: Allergy & Precautions, H&P , NPO status , Patient's Chart, lab work & pertinent test results  History of Anesthesia Complications (+) PONV and history of anesthetic complications  Airway Mallampati: II  TM Distance: >3 FB Neck ROM: Full    Dental no notable dental hx.    Pulmonary former smoker   Pulmonary exam normal breath sounds clear to auscultation       Cardiovascular hypertension, Pt. on medications Normal cardiovascular exam Rhythm:Regular Rate:Normal     Neuro/Psych negative neurological ROS  negative psych ROS   GI/Hepatic negative GI ROS, Neg liver ROS,,,  Endo/Other    Morbid obesity  Renal/GU negative Renal ROS  negative genitourinary   Musculoskeletal negative musculoskeletal ROS (+)    Abdominal   Peds negative pediatric ROS (+)  Hematology  (+) Blood dyscrasia, anemia   Anesthesia Other Findings   Reproductive/Obstetrics negative OB ROS                              Anesthesia Physical Anesthesia Plan  ASA: 3  Anesthesia Plan: General   Post-op Pain Management: Tylenol PO (pre-op)*   Induction: Intravenous  PONV Risk Score and Plan: 4 or greater and Ondansetron, Dexamethasone and Treatment may vary due to age or medical condition  Airway Management Planned: Oral ETT  Additional Equipment:   Intra-op Plan:   Post-operative Plan: Extubation in OR  Informed Consent: I have reviewed the patients History and Physical, chart, labs and discussed the procedure including the risks, benefits and alternatives for the proposed anesthesia with the patient or authorized representative who has indicated his/her understanding and acceptance.     Dental advisory given  Plan Discussed with: CRNA and Surgeon  Anesthesia Plan Comments:         Anesthesia Quick  Evaluation

## 2023-04-06 DIAGNOSIS — S72409A Unspecified fracture of lower end of unspecified femur, initial encounter for closed fracture: Secondary | ICD-10-CM | POA: Diagnosis not present

## 2023-04-06 LAB — CBC
HCT: 23.7 % — ABNORMAL LOW (ref 36.0–46.0)
Hemoglobin: 7.4 g/dL — ABNORMAL LOW (ref 12.0–15.0)
MCH: 28 pg (ref 26.0–34.0)
MCHC: 31.2 g/dL (ref 30.0–36.0)
MCV: 89.8 fL (ref 80.0–100.0)
Platelets: 245 10*3/uL (ref 150–400)
RBC: 2.64 MIL/uL — ABNORMAL LOW (ref 3.87–5.11)
RDW: 24.9 % — ABNORMAL HIGH (ref 11.5–15.5)
WBC: 24.4 10*3/uL — ABNORMAL HIGH (ref 4.0–10.5)
nRBC: 0.7 % — ABNORMAL HIGH (ref 0.0–0.2)

## 2023-04-06 LAB — BASIC METABOLIC PANEL WITH GFR
Anion gap: 10 (ref 5–15)
BUN: 43 mg/dL — ABNORMAL HIGH (ref 8–23)
CO2: 22 mmol/L (ref 22–32)
Calcium: 8.3 mg/dL — ABNORMAL LOW (ref 8.9–10.3)
Chloride: 99 mmol/L (ref 98–111)
Creatinine, Ser: 1.68 mg/dL — ABNORMAL HIGH (ref 0.44–1.00)
GFR, Estimated: 29 mL/min — ABNORMAL LOW
Glucose, Bld: 131 mg/dL — ABNORMAL HIGH (ref 70–99)
Potassium: 5.3 mmol/L — ABNORMAL HIGH (ref 3.5–5.1)
Sodium: 131 mmol/L — ABNORMAL LOW (ref 135–145)

## 2023-04-06 LAB — HEMOGLOBIN AND HEMATOCRIT, BLOOD
HCT: 21.6 % — ABNORMAL LOW (ref 36.0–46.0)
Hemoglobin: 6.6 g/dL — CL (ref 12.0–15.0)

## 2023-04-06 LAB — PREPARE RBC (CROSSMATCH)

## 2023-04-06 LAB — POTASSIUM: Potassium: 4.9 mmol/L (ref 3.5–5.1)

## 2023-04-06 LAB — VITAMIN D 25 HYDROXY (VIT D DEFICIENCY, FRACTURES): Vit D, 25-Hydroxy: 5.84 ng/mL — ABNORMAL LOW (ref 30–100)

## 2023-04-06 MED ORDER — ONDANSETRON HCL 4 MG/2ML IJ SOLN: 4.0000 mg | Freq: Four times a day (QID) | INTRAMUSCULAR | Status: DC | PRN

## 2023-04-06 MED ORDER — SODIUM CHLORIDE 0.9% IV SOLUTION: Freq: Once | INTRAVENOUS | Status: DC

## 2023-04-06 MED ORDER — HYDROMORPHONE HCL 1 MG/ML IJ SOLN: 0.5000 mg | INTRAMUSCULAR | Status: DC | PRN

## 2023-04-06 MED ORDER — TRAMADOL HCL 50 MG PO TABS
50.0000 mg | ORAL_TABLET | Freq: Two times a day (BID) | ORAL | Status: DC | PRN
  Administered 2023-04-06: 50 mg via ORAL
  Filled 2023-04-06: qty 1

## 2023-04-06 MED ORDER — MELATONIN 5 MG PO TABS
5.0000 mg | ORAL_TABLET | Freq: Every day | ORAL | Status: DC
  Administered 2023-04-06 – 2023-04-10 (×5): 5 mg via ORAL
  Filled 2023-04-06 (×5): qty 1

## 2023-04-06 MED ORDER — SODIUM CHLORIDE 0.9 % IV BOLUS
500.0000 mL | Freq: Once | INTRAVENOUS | Status: AC
  Administered 2023-04-06: 500 mL via INTRAVENOUS

## 2023-04-06 MED ORDER — OXYCODONE HCL 5 MG PO TABS
5.0000 mg | ORAL_TABLET | ORAL | Status: DC | PRN
  Administered 2023-04-06: 5 mg via ORAL
  Filled 2023-04-06 (×2): qty 1

## 2023-04-06 MED ORDER — POLYETHYLENE GLYCOL 3350 17 G PO PACK
17.0000 g | PACK | Freq: Every day | ORAL | Status: DC
  Administered 2023-04-06 – 2023-04-07 (×2): 17 g via ORAL
  Filled 2023-04-06 (×2): qty 1

## 2023-04-06 MED ORDER — ALUM & MAG HYDROXIDE-SIMETH 200-200-20 MG/5ML PO SUSP: 15.0000 mL | Freq: Four times a day (QID) | ORAL | Status: DC | PRN

## 2023-04-06 MED ORDER — ACETAMINOPHEN 500 MG PO TABS
1000.0000 mg | ORAL_TABLET | Freq: Four times a day (QID) | ORAL | Status: DC
  Administered 2023-04-06 – 2023-04-13 (×23): 1000 mg via ORAL
  Filled 2023-04-06 (×26): qty 2

## 2023-04-06 MED ORDER — TRANEXAMIC ACID-NACL 1000-0.7 MG/100ML-% IV SOLN: 1000.0000 mg | Freq: Once | INTRAVENOUS | Status: DC

## 2023-04-06 MED ORDER — GABAPENTIN 300 MG PO CAPS
600.0000 mg | ORAL_CAPSULE | Freq: Every day | ORAL | Status: DC
  Administered 2023-04-06 – 2023-04-10 (×4): 600 mg via ORAL
  Filled 2023-04-06 (×5): qty 2

## 2023-04-06 MED ORDER — POLYETHYLENE GLYCOL 3350 17 G PO PACK
17.0000 g | PACK | Freq: Every day | ORAL | Status: DC | PRN
  Administered 2023-04-08: 17 g via ORAL
  Filled 2023-04-06: qty 1

## 2023-04-06 MED ORDER — OXYCODONE HCL 5 MG PO TABS: 5.0000 mg | ORAL_TABLET | ORAL | Status: DC | PRN

## 2023-04-06 MED ORDER — DOCUSATE SODIUM 100 MG PO CAPS
100.0000 mg | ORAL_CAPSULE | Freq: Two times a day (BID) | ORAL | Status: DC
  Administered 2023-04-06 – 2023-04-08 (×6): 100 mg via ORAL
  Filled 2023-04-06 (×6): qty 1

## 2023-04-06 MED ORDER — DIPHENHYDRAMINE HCL 12.5 MG/5ML PO ELIX
12.5000 mg | ORAL_SOLUTION | ORAL | Status: DC | PRN
  Administered 2023-04-06: 25 mg via ORAL
  Filled 2023-04-06: qty 10

## 2023-04-06 MED ORDER — METHOCARBAMOL 500 MG PO TABS
500.0000 mg | ORAL_TABLET | Freq: Four times a day (QID) | ORAL | Status: DC | PRN
  Administered 2023-04-06 – 2023-04-08 (×3): 500 mg via ORAL
  Filled 2023-04-06 (×3): qty 1

## 2023-04-06 MED ORDER — METHOCARBAMOL 1000 MG/10ML IJ SOLN: 500.0000 mg | Freq: Four times a day (QID) | INTRAVENOUS | Status: DC | PRN

## 2023-04-06 MED ORDER — GABAPENTIN 300 MG PO CAPS
300.0000 mg | ORAL_CAPSULE | Freq: Every morning | ORAL | Status: DC
  Administered 2023-04-06 – 2023-04-10 (×5): 300 mg via ORAL
  Filled 2023-04-06 (×5): qty 1

## 2023-04-06 MED ORDER — METOCLOPRAMIDE HCL 5 MG/ML IJ SOLN: 5.0000 mg | Freq: Three times a day (TID) | INTRAMUSCULAR | Status: DC | PRN

## 2023-04-06 MED ORDER — SODIUM ZIRCONIUM CYCLOSILICATE 10 G PO PACK
10.0000 g | PACK | Freq: Once | ORAL | Status: AC
  Administered 2023-04-06: 10 g via ORAL
  Filled 2023-04-06: qty 1

## 2023-04-06 MED ORDER — HYDROMORPHONE HCL 1 MG/ML IJ SOLN
0.5000 mg | INTRAMUSCULAR | Status: DC | PRN
  Administered 2023-04-08 – 2023-04-09 (×2): 0.5 mg via INTRAVENOUS
  Filled 2023-04-06 (×3): qty 0.5

## 2023-04-06 MED ORDER — SENNOSIDES-DOCUSATE SODIUM 8.6-50 MG PO TABS
1.0000 | ORAL_TABLET | Freq: Two times a day (BID) | ORAL | Status: DC
  Administered 2023-04-06 – 2023-04-08 (×6): 1 via ORAL
  Filled 2023-04-06 (×6): qty 1

## 2023-04-06 MED ORDER — SODIUM CHLORIDE 0.9 % IV SOLN
3.0000 g | Freq: Two times a day (BID) | INTRAVENOUS | Status: DC
  Administered 2023-04-06: 3 g via INTRAVENOUS
  Filled 2023-04-06 (×2): qty 8

## 2023-04-06 MED ORDER — METOCLOPRAMIDE HCL 5 MG PO TABS: 5.0000 mg | ORAL_TABLET | Freq: Three times a day (TID) | ORAL | Status: DC | PRN

## 2023-04-06 MED ORDER — ONDANSETRON HCL 4 MG PO TABS: 4.0000 mg | ORAL_TABLET | Freq: Four times a day (QID) | ORAL | Status: DC | PRN

## 2023-04-06 MED ORDER — SODIUM CHLORIDE 0.9 % IV SOLN: INTRAVENOUS | Status: DC

## 2023-04-06 NOTE — Progress Notes (Signed)
PHARMACY NOTE:  ANTIMICROBIAL RENAL DOSAGE ADJUSTMENT  Current antimicrobial regimen includes a mismatch between antimicrobial dosage and estimated renal function.  As per policy approved by the Pharmacy & Therapeutics and Medical Executive Committees, the antimicrobial dosage will be adjusted accordingly.  Current antimicrobial dosage:  Unasyn 3g IV q8 hours  Indication: intra-abdominal infection  Renal Function:  Estimated Creatinine Clearance: 28.3 mL/min (A) (by C-G formula based on SCr of 1.68 mg/dL (H)). []      On intermittent HD, scheduled: []      On CRRT    Antimicrobial dosage has been changed to:  Unasyn 3g IV q12 hours  Additional comments:   Thank you for allowing pharmacy to be a part of this patient's care.  Rexford Maus, PharmD, BCPS 04/06/2023 7:56 AM

## 2023-04-06 NOTE — [place] FL2 (Signed)
[place] MEDICAID FL2 LEVEL OF CARE FORM     IDENTIFICATION  Patient Name: Chelsea Keller Birthdate: Oct 02, 1934 Sex: female Admission Date (Current Location): 04/03/2023  [facility] and [place] Number:  Producer, television/film/video and Address:  [facility] [place], [address], [place], [place] [address]      Provider Number: [id number]  Attending Physician Name and Address:  Willeen Niece, MD  Relative Name and Phone Number:  Rannie, Ruis Daughter [phone]    Current Level of Care: Hospital Recommended Level of Care: Skilled Nursing Facility Prior Approval Number:    Date Approved/Denied:   PASRR Number: [id number]  Discharge Plan: SNF    Current Diagnoses: Patient Active Problem List   Diagnosis Date Noted   Closed fracture of distal end of femur, unspecified fracture morphology, initial encounter (HCC) 04/03/2023   Acute diverticulitis 04/03/2023   Leukocytosis 04/03/2023   Chronic kidney disease, stage 3b (HCC) 04/03/2023   Recurrent deep vein thrombosis (DVT) (HCC) 04/03/2023   Periumbilical abdominal pain 03/15/2023   Cellulitis of right leg 02/08/2023   UTI (urinary tract infection) 11/12/2022   Chest pain 11/12/2022   Chest pain at rest 11/12/2022   Left leg pain 12/15/2021   Exacerbation of gout 07/24/2021   Pruritic disorder 12/07/2019   Anemia of chronic disease 11/20/2019   Recurrent pulmonary emboli (HCC) 06/18/2019   Vertigo 01/12/2019   Aortic atherosclerosis (HCC) 09/12/2018   Iron deficiency anemia due to chronic blood loss 08/11/2018   Acute diarrhea 04/20/2018   Gout 04/01/2018   Symptomatic anemia 03/21/2018   Degenerative joint disease (DJD) of hip, Bilateral  11/12/2017   DDD (degenerative disc disease), lumbar 11/12/2017   Chronic anticoagulation 11/07/2017   Neurodermatitis 07/28/2017   Urinary incontinence 05/26/2017   Chronic diastolic CHF (congestive heart failure) (HCC) 04/03/2017   Hypertension 04/03/2017    Vitamin B12 deficiency 04/03/2017   Chronic kidney disease, stage 3a (HCC) 04/03/2017   Lumbar radiculopathy    Advance directive discussed with patient 01/19/2017   Mood disorder (HCC) 11/03/2013   Psoriasis 02/21/2013   Routine general medical examination at a health care facility 06/14/2012   Morbid obesity (HCC) 12/02/2011   VENTRICULAR HYPERTROPHY, LEFT 10/19/2008   Venous (peripheral) insufficiency 08/11/2007   Osteoarthritis, multiple sites 08/11/2007   B12 DEFICIENCY 09/30/2006   Essential hypertension 09/30/2006   Allergic rhinitis due to pollen 09/30/2006    Orientation RESPIRATION BLADDER Height & Weight     Self, Time, Situation, Place  Normal Continent Weight: 238 lb 1.6 oz (108 kg) Height:  5\' 3"  (160 cm)  BEHAVIORAL SYMPTOMS/MOOD NEUROLOGICAL BOWEL NUTRITION STATUS      Continent Diet  AMBULATORY STATUS COMMUNICATION OF NEEDS Skin   Total Care Verbally Surgical wounds                       Personal Care Assistance Level of Assistance  Bathing, Feeding, Dressing, Total care Bathing Assistance: Maximum assistance Feeding assistance: Limited assistance Dressing Assistance: Maximum assistance Total Care Assistance: Maximum assistance   Functional Limitations Info  Sight, Hearing, Speech Sight Info: Adequate Hearing Info: Adequate Speech Info: Adequate    SPECIAL CARE FACTORS FREQUENCY  PT (By licensed PT), OT (By licensed OT)     PT Frequency: 5x week OT Frequency: 5x week            Contractures Contractures Info: Not present    Additional Factors Info  Code Status, Allergies Code Status Info: full Allergies Info: Codeine Sulfate,  Celecoxib, Erythromycin Base, Spironolactone           Current Medications (04/06/2023):  This is the current hospital active medication list Current Facility-Administered Medications  Medication Dose Route Frequency Provider Last Rate Last Admin   0.9 %  sodium chloride infusion (Manually program via  Guardrails IV Fluids)   Intravenous Once Willeen Niece, MD       0.9 %  sodium chloride infusion   Intravenous Continuous McClung, Sarah A, PA-C       acetaminophen (TYLENOL) tablet 1,000 mg  1,000 mg Oral Q6H McClung, Sarah A, PA-C   1,000 mg at 04/06/23 1148   alum & mag hydroxide-simeth (MAALOX/MYLANTA) 200-200-20 MG/5ML suspension 15 mL  15 mL Oral Q6H PRN Willeen Niece, MD       Ampicillin-Sulbactam (UNASYN) 3 g in sodium chloride 0.9 % 100 mL IVPB  3 g Intravenous Q12H Rexford Maus, RPH       camphor-menthol [facility]) lotion   Topical PRN Luiz Iron, NP   Given at 04/06/23 0243   diphenhydrAMINE (BENADRYL) 12.5 MG/5ML elixir 12.5-25 mg  12.5-25 mg Oral Q4H PRN West Bali, PA-C       docusate sodium (COLACE) capsule 100 mg  100 mg Oral BID [place] Bali, PA-C   100 mg at 04/06/23 6962   gabapentin (NEURONTIN) capsule 300 mg  300 mg Oral q morning Willeen Niece, MD   300 mg at 04/06/23 9528   gabapentin (NEURONTIN) capsule 600 mg  600 mg Oral Q supper Willeen Niece, MD       HYDROmorphone (DILAUDID) injection 0.5 mg  0.5 mg Intravenous Q4H PRN [place] Bali, PA-C       melatonin tablet 5 mg  5 mg Oral QHS McClung, Sarah A, PA-C       methocarbamol (ROBAXIN) tablet 500 mg  500 mg Oral Q6H PRN [place] Bali, PA-C   500 mg at 04/06/23 1148   Or   methocarbamol (ROBAXIN) 500 mg in dextrose 5 % 50 mL IVPB  500 mg Intravenous Q6H PRN McClung, Sarah A, PA-C       metoCLOPramide (REGLAN) tablet 5-10 mg  5-10 mg Oral Q8H PRN Sharon Seller, Sarah A, PA-C       Or   metoCLOPramide (REGLAN) injection 5-10 mg  5-10 mg Intravenous Q8H PRN West Bali, PA-C       ondansetron (ZOFRAN) tablet 4 mg  4 mg Oral Q6H PRN [place] Bali, PA-C       Or   ondansetron (ZOFRAN) injection 4 mg  4 mg Intravenous Q6H PRN McClung, Sarah A, PA-C       oxyCODONE (Oxy IR/ROXICODONE) immediate release tablet 5 mg  5 mg Oral Q4H PRN McClung, Sarah A, PA-C       polyethylene glycol (MIRALAX /  GLYCOLAX) packet 17 g  17 g Oral Daily PRN McClung, Sarah A, PA-C       polyethylene glycol (MIRALAX / GLYCOLAX) packet 17 g  17 g Oral Daily Idelle Leech, Pardeep, MD   17 g at 04/06/23 1314   prochlorperazine (COMPAZINE) injection 10 mg  10 mg Intravenous Q6H PRN [place] Bali, PA-C   10 mg at 04/03/23 2056   senna-docusate (Senokot-S) tablet 1 tablet  1 tablet Oral BID Willeen Niece, MD   1 tablet at 04/06/23 1314   traMADol (ULTRAM) tablet 50 mg  50 mg Oral Q12H PRN [place] Bali, PA-C   50 mg at 04/06/23 [id number]  Discharge Medications: Please see discharge summary for a list of discharge medications.  Relevant Imaging Results:  Relevant Lab Results:   Additional Information SS#: [id number]  Lorri Frederick, LCSW

## 2023-04-06 NOTE — Progress Notes (Signed)
Orthopaedic Trauma Progress Note  SUBJECTIVE: Doing ok this AM. Pain controlled currently.  Patient states she is having difficulty with word finding.  She is alert and oriented but cannot say what she wants to.  No chest pain. No SOB. No nausea/vomiting. No other complaints.  No family at bedside currently.  I did speak with patient's daughter on the phone.  Apparently patient called her daughter about 10 times overnight and was noted to be extremely confused.  Patient daughter concerned about a UTI.  Will check UA to rule out UTI today.  She also notes that in the past when patient had COVID she was similarly confused.  Hemoglobin 7.4 this morning.  Per daughter, patient receives double blood transfusions about every 6 to 8 weeks by her oncologist Dr. Pamelia Hoit  K noted to be 5.3 this morning.  OBJECTIVE:  Vitals:   04/06/23 0425 04/06/23 0723  BP: (!) 170/72 (!) 148/69  Pulse: 91 93  Resp: 15   Temp: 97.8 F (36.6 C) 97.7 F (36.5 C)  SpO2: 97% 97%    General: Sitting up in bed, no acute distress.  Alert and oriented x 3 Respiratory: No increased work of breathing.  Left lower extremity: Dressing clean, dry, intact.  Mildly tender about the knee and throughout the lateral thigh as expected.  No significant calf tenderness.  Tolerates gentle ankle range of motion.  Able to wiggle toes.  Endorses sensation of light touch over all aspects of the foot.  Neurovascularly intact.  IMAGING: Stable post op imaging.   LABS:  Results for orders placed or performed during the hospital encounter of 04/03/23 (from the past 24 hour(s))  Type and screen [facility]     Status: None   Collection Time: 04/05/23  9:17 AM  Result Value Ref Range   ABO/RH(D) B POS    Antibody Screen NEG    Sample Expiration      04/08/2023,2359 Performed at [facility], [address] [place], [place] [address]   CBC     Status: Abnormal   Collection Time: 04/06/23  3:27 AM  Result Value  Ref Range   WBC 24.4 (H) 4.0 - 10.5 K/uL   RBC 2.64 (L) 3.87 - 5.11 MIL/uL   Hemoglobin 7.4 (L) 12.0 - 15.0 g/dL   HCT 32.4 (L) 40.1 - 02.7 %   MCV 89.8 80.0 - 100.0 fL   MCH 28.0 26.0 - 34.0 pg   MCHC 31.2 30.0 - 36.0 g/dL   RDW 25.3 (H) 66.4 - 40.3 %   Platelets 245 150 - 400 K/uL   nRBC 0.7 (H) 0.0 - 0.2 %  Basic metabolic panel     Status: Abnormal   Collection Time: 04/06/23  3:27 AM  Result Value Ref Range   Sodium 131 (L) 135 - 145 mmol/L   Potassium 5.3 (H) 3.5 - 5.1 mmol/L   Chloride 99 98 - 111 mmol/L   CO2 22 22 - 32 mmol/L   Glucose, Bld 131 (H) 70 - 99 mg/dL   BUN 43 (H) 8 - 23 mg/dL   Creatinine, Ser 4.74 (H) 0.44 - 1.00 mg/dL   Calcium 8.3 (L) 8.9 - 10.3 mg/dL   GFR, Estimated 29 (L) >60 mL/min   Anion gap 10 5 - 15    ASSESSMENT: Chelsea Keller is a 88 y.o. female, 1 Day Post-Op s/p OPEN REDUCTION INTERNAL FIXATION (ORIF) DISTAL FEMUR FRACTURE  CV/Blood loss: Acute blood loss anemia, Hgb 7.4 this  morning.  1 unit PRBCs ordered per primary team. Hemodynamically stable  PLAN: Weightbearing: TDWB LLE ROM: Unrestricted ROM Incisional and dressing care: Reinforce dressings as needed  Showering: Okay to begin showering and getting incisions wet 04/08/2023 Orthopedic device(s): None  Pain management:  1. Tylenol 1000 mg q 6 hours scheduled 2. Robaxin 500 mg q 6 hours PRN 3. Oxycodone 5 mg q 4 hours PRN 4. Dilaudid 0.5 mg q 3 hours PRN 5. Tramadol 50 mg q 6 hours PRN 6.  Gabapentin 300-600 mg BID VTE prophylaxis: Eliquis on hold until hemoglobin stable. SCDs ID: Currently on Unasyn for diverticulitis.  No additional postop ABX required Foley/Lines:  No foley, KVO IVFs Impediments to Fracture Healing: Vitamin D level pending, will start supplementation as indicated. Dispo: PT/OT evaluation today, dispo pending.  Patient will likely require SNF.  Will follow-up on UA once completed.  Will discuss with hospitalist given patient's history of recurrent DVTs  appropriate timing for restarting home dose Eliquis.  D/C recommendations: -Oxycodone 5 mg for pain control -Home dose Eliquis for DVT prophylaxis -Possible need for Vit D supplementation  Follow - up plan: 2 weeks after discharge for wound check and repeat x-rays   Contact information:  Truitt Merle MD, Thyra Breed PA-C. After hours and holidays please check [email or web address] for group call information for [facility]   Thompson Caul, PA-C [phone] (office) [email or web address]

## 2023-04-06 NOTE — Evaluation (Signed)
Occupational Therapy Evaluation Patient Details Name: Chelsea Keller MRN: [id number] DOB: 30-Jan-1935 Today's Date: 04/06/2023   History of Present Illness Pt is 88 y.o. female s/p left ORIF left femur fx sustained from a mechanical fall at home.   Clinical Impression   Pt admitted with the above diagnosis. Pt currently with functional limitations due to the deficits listed below (see OT Problem List). Prior to admit, pt was living at home with family, Mod I for BADL tasks and functional mobility.  Pt will benefit from acute skilled OT to increase their safety and independence with ADL and functional mobility for ADL to facilitate discharge. Patient will benefit from continued inpatient follow up therapy, <3 hours/day. OT will continue to follow patient acutely.          If plan is discharge home, recommend the following: Two people to help with walking and/or transfers;Two people to help with bathing/dressing/bathroom;Assist for transportation;Help with stairs or ramp for entrance    Functional Status Assessment  Patient has had a recent decline in their functional status and demonstrates the ability to make significant improvements in function in a reasonable and predictable amount of time.  Equipment Recommendations  Other (comment) (defer to next venue of care)       Precautions / Restrictions Precautions Precautions: Fall Restrictions Weight Bearing Restrictions: Yes LLE Weight Bearing: Touchdown weight bearing      Mobility Bed Mobility Overal bed mobility: Needs Assistance Bed Mobility: Supine to Sit, Sit to Supine     Supine to sit: Total assist, +2 for physical assistance, HOB elevated, Used rails Sit to supine: Total assist, +2 for physical assistance   General bed mobility comments: VC provided with physical assist for hand set up to use bed rail on right side to assist with transition. Bed pad used to pivot hips and allow patient to sit EOB. Total A with bed pads to  scoot towards EOB. Returned supine in same fashion with total A x2.    Transfers   General transfer comment: Deferred transfer due to medical/safety concerns      Balance Overall balance assessment: Needs assistance, History of Falls Sitting-balance support: Bilateral upper extremity supported, Feet supported Sitting balance-Leahy Scale: Poor Sitting balance - Comments: sitting EOB      ADL either performed or assessed with clinical judgement   ADL Overall ADL's : Needs assistance/impaired    General ADL Comments: At time of evaluation, all ADL tasks such as bathing, dressing, grooming, toileting completed at bed level due pain level and low Hb. Total A X2 requires for LB ADL. Min A x1 for UB.     Vision Baseline Vision/History: 4 Cataracts (left eye) Ability to See in Adequate Light: 0 Adequate (using right eye) Patient Visual Report: No change from baseline Vision Assessment?: No apparent visual deficits     Perception Perception: Not tested       Praxis Praxis: Not tested       Pertinent Vitals/Pain Pain Assessment Pain Assessment: Faces Faces Pain Scale: Hurts whole lot Pain Location: LLE with mobility and movement Pain Descriptors / Indicators: Operative site guarding, Grimacing Pain Intervention(s): Limited activity within patient's tolerance, Monitored during session, Repositioned, Premedicated before session     Extremity/Trunk Assessment Upper Extremity Assessment Upper Extremity Assessment: Generalized weakness   Lower Extremity Assessment Lower Extremity Assessment: Defer to PT evaluation   Cervical / Trunk Assessment Cervical / Trunk Assessment: Other exceptions (body habitus)   Communication Communication Communication: No apparent difficulties   Cognition Arousal:  Alert Behavior During Therapy: Flat affect Overall Cognitive Status: Within Functional Limits for tasks assessed       General Comments  Ace wrap and post surgical dressing in  place on LLE. Pt reported increased dizziness upon sitting  EOB which did not resolve. SpO2 and HR stable. Pt returned supine and nurse made aware.            Home Living Family/patient expects to be discharged to:: Private residence Living Arrangements: Children Available Help at Discharge: Family Type of Home: House Home Access: Stairs to enter Secretary/administrator of Steps: 1 small step Entrance Stairs-Rails: None Home Layout: Two level Alternate Level Stairs-Number of Steps: stair lift Alternate Level Stairs-Rails: Right Bathroom Shower/Tub: Chief Strategy Officer: Handicapped height Bathroom Accessibility: Yes   Home Equipment: Rollator (4 wheels);Cane - single point;Tub bench;Shower seat;BSC/3in1   Additional Comments: sleeps in recliner      Prior Functioning/Environment Prior Level of Function : Independent/Modified Independent             Mobility Comments: mod I with rollator ADLs Comments: Mod I level for all ADLs, self care tasks. family assists with meals, housekeeping, laundry and IADLs        OT Problem List: Decreased strength;Decreased activity tolerance;Impaired balance (sitting and/or standing);Decreased knowledge of use of DME or AE;Decreased knowledge of precautions;Pain      OT Treatment/Interventions: Therapeutic exercise;Self-care/ADL training;Energy conservation;DME and/or AE instruction;Manual therapy;Modalities;Therapeutic activities;Patient/family education;Balance training    OT Goals(Current goals can be found in the care plan section) Acute Rehab OT Goals Patient Stated Goal: none stated OT Goal Formulation: Patient unable to participate in goal setting Time For Goal Achievement: 04/10/2023 Potential to Achieve Goals: Good  OT Frequency: Min 1X/week    Co-evaluation PT/OT/SLP Co-Evaluation/Treatment: Yes Reason for Co-Treatment: For patient/therapist safety;To address functional/ADL transfers   OT goals addressed  during session: ADL's and self-care;Strengthening/ROM      AM-PAC OT "6 Clicks" Daily Activity     Outcome Measure Help from another person eating meals?: A Little Help from another person taking care of personal grooming?: A Little Help from another person toileting, which includes using toliet, bedpan, or urinal?: Total Help from another person bathing (including washing, rinsing, drying)?: Total Help from another person to put on and taking off regular upper body clothing?: A Lot Help from another person to put on and taking off regular lower body clothing?: Total 6 Click Score: 11   End of Session Nurse Communication: Mobility status  Activity Tolerance: Treatment limited secondary to medical complications (Comment) (low Hb. symptomatic when seated EOB.) Patient left: in bed;with call bell/phone within reach;with bed alarm set;with nursing/sitter in room  OT Visit Diagnosis: Muscle weakness (generalized) (M62.81);Pain;History of falling (Z91.81) Pain - Right/Left: Left Pain - part of body: Leg                Time: 1052-1106 OT Time Calculation (min): 14 min Charges:  OT General Charges $OT Visit: 1 Visit OT Evaluation $OT Eval Moderate Complexity: 1 Mod  [employer], OTR/L,CBIS  Supplemental OT - [facility] and [facility] Secure Chat Preferred    Lakeeta Dobosz, Charisse March 04/06/2023, 11:21 AM

## 2023-04-06 NOTE — Evaluation (Signed)
Physical Therapy Evaluation Patient Details Name: Chelsea Keller MRN: [id number] DOB: 1935-04-29 Today's Date: 04/06/2023  History of Present Illness  Pt is 88 y.o. female s/p left ORIF left femur fx sustained from a mechanical fall at home. PMH: chronic diastolic CHF, essential hypertension, chronic blood loss anemia requiring blood transfusion, morbid obesity   Clinical Impression  Pt admitted with above diagnosis. PTA pt lived at home with daughter, mod I mobility using rollator. Pt currently with functional limitations due to the deficits listed below (see PT Problem List). On eval, pt required +2 total assist supine <> sit, and demo poor sitting balance. Unable to progress OOB due to pain and dizziness. Pt will benefit from acute skilled PT to increase their independence and safety with mobility to allow discharge. Upon d/c, pt would benefit from further therapy in inpatient setting, < 3 hours/day.          If plan is discharge home, recommend the following:     Can travel by private vehicle   No    Equipment Recommendations Other (comment) (defer to post acute)  Recommendations for Other Services       Functional Status Assessment Patient has had a recent decline in their functional status and demonstrates the ability to make significant improvements in function in a reasonable and predictable amount of time.     Precautions / Restrictions Precautions Precautions: Fall Restrictions Weight Bearing Restrictions: Yes LLE Weight Bearing: Touchdown weight bearing      Mobility  Bed Mobility Overal bed mobility: Needs Assistance Bed Mobility: Supine to Sit, Sit to Supine     Supine to sit: Total assist, +2 for physical assistance, HOB elevated, Used rails Sit to supine: Total assist, +2 for physical assistance   General bed mobility comments: helicopter method using bed pads to transition to/from EOB    Transfers                   General transfer comment:  deferred due to pain and dizziness upon sitting    Ambulation/Gait                  Stairs            Wheelchair Mobility     Tilt Bed    Modified Rankin (Stroke Patients Only)       Balance Overall balance assessment: Needs assistance, History of Falls Sitting-balance support: Bilateral upper extremity supported, Feet supported Sitting balance-Leahy Scale: Poor                                       Pertinent Vitals/Pain Pain Assessment Pain Assessment: Faces Faces Pain Scale: Hurts whole lot Pain Location: LLE with mobility and movement Pain Descriptors / Indicators: Operative site guarding, Grimacing Pain Intervention(s): Premedicated before session, Monitored during session, Repositioned, Limited activity within patient's tolerance    Home Living Family/patient expects to be discharged to:: Private residence Living Arrangements: Children Available Help at Discharge: Family Type of Home: House Home Access: Stairs to enter Entrance Stairs-Rails: None Entrance Stairs-Number of Steps: 1 small step Alternate Level Stairs-Number of Steps: stair lift Home Layout: Two level Home Equipment: Rollator (4 wheels);Cane - single point;Tub bench;Shower seat;BSC/3in1;Rolling Environmental consultant (2 wheels) Additional Comments: sleeps in recliner    Prior Function Prior Level of Function : Independent/Modified Independent             Mobility Comments: mod I  with rollator ADLs Comments: Mod I level for all ADLs, self care tasks. family assists with meals, housekeeping, laundry and IADLs     Extremity/Trunk Assessment   Upper Extremity Assessment Upper Extremity Assessment: Defer to OT evaluation    Lower Extremity Assessment Lower Extremity Assessment: Generalized weakness;LLE deficits/detail LLE Deficits / Details: s/p ORIF disal femur    Cervical / Trunk Assessment Cervical / Trunk Assessment: Other exceptions Cervical / Trunk Exceptions: body  habitus  Communication   Communication Communication: No apparent difficulties  Cognition Arousal: Alert Behavior During Therapy: Flat affect Overall Cognitive Status: Within Functional Limits for tasks assessed                                          General Comments General comments (skin integrity, edema, etc.): Hgb 7.4 this morning. Pt with c/o dizziness upon sitting. Pt with order for 1 unit PRBC. HR and SpO2 stable on RA.    Exercises     Assessment/Plan    PT Assessment Patient needs continued PT services  PT Problem List Decreased strength;Decreased balance;Pain;Decreased mobility;Obesity;Decreased activity tolerance       PT Treatment Interventions DME instruction;Functional mobility training;Balance training;Patient/family education;Gait training;Therapeutic activities;Therapeutic exercise    PT Goals (Current goals can be found in the Care Plan section)  Acute Rehab PT Goals Patient Stated Goal: rehab then home PT Goal Formulation: With patient Time For Goal Achievement: 04/22/2023 Potential to Achieve Goals: Fair    Frequency Min 1X/week     Co-evaluation PT/OT/SLP Co-Evaluation/Treatment: Yes Reason for Co-Treatment: For patient/therapist safety;To address functional/ADL transfers PT goals addressed during session: Mobility/safety with mobility;Balance OT goals addressed during session: ADL's and self-care;Strengthening/ROM       AM-PAC PT "6 Clicks" Mobility  Outcome Measure Help needed turning from your back to your side while in a flat bed without using bedrails?: Total Help needed moving from lying on your back to sitting on the side of a flat bed without using bedrails?: Total Help needed moving to and from a bed to a chair (including a wheelchair)?: Total Help needed standing up from a chair using your arms (e.g., wheelchair or bedside chair)?: Total Help needed to walk in hospital room?: Total Help needed climbing 3-5 steps with a  railing? : Total 6 Click Score: 6    End of Session   Activity Tolerance: Patient limited by pain;Treatment limited secondary to medical complications (Comment) (dizziness, low Hgb) Patient left: in bed;with call bell/phone within reach;with bed alarm set Nurse Communication: Mobility status PT Visit Diagnosis: Other abnormalities of gait and mobility (R26.89);Pain Pain - Right/Left: Left Pain - part of body: Leg    Time: 9147-8295 PT Time Calculation (min) (ACUTE ONLY): 21 min   Charges:   PT Evaluation $PT Eval Moderate Complexity: 1 Mod   PT General Charges $$ ACUTE PT VISIT: 1 Visit         Ferd Glassing., PT  Office # [phone]   Ilda Foil 04/06/2023, 11:58 AM

## 2023-04-06 NOTE — Progress Notes (Signed)
PROGRESS NOTE    Braeden Koep  WGN:[id number] DOB: 1935-06-07 DOA: 04/03/2023 PCP: [name], MD   Brief Narrative:  This 88 yrs old female with PMH significant for chronic diastolic CHF, essential hypertension, chronic blood loss anemia requiring blood transfusion, morbid obesity who presented in the ED due to fall sustained at home. Patient states she has lost her balance while coming out of the bathroom and heading to her bedroom due to her legs giving out on her. She had difficulty in being able to bear weight on her left leg.  Patient also complained of 4 to 5 weeks onset of abdominal pain mainly in the lower abdomen.  She has complained of constipation for 2 weeks, then has taken MiraLAX but now has developed non- bloody diarrhea. Workup in the ED Left knee x-ray shows acute comminuted fracture of distal femoral metaphysis at the level of femoral component of knee replacement.  CT chest, abdomen and pelvis showed no evidence of acute traumatic injury in the chest,  abdomen or pelvis.  This pericolonic inflammation along the descending colon suggesting diverticulitis.  CT head and neck no acute intracranial abnormality. Patient is admitted for further evaluation.  Orthopedics consulted.  Started on IV Unasyn.   Assessment & Plan:   Principal Problem:   Closed fracture of distal end of femur, unspecified fracture morphology, initial encounter (HCC) Active Problems:   Essential hypertension   Chronic diastolic CHF (congestive heart failure) (HCC)   Acute diarrhea   Anemia of chronic disease   Acute diverticulitis   Leukocytosis   Chronic kidney disease, stage 3b (HCC)   Recurrent deep vein thrombosis (DVT) (HCC)  Acute comminuted fracture of distal femoral metaphysis, Left: Left knee x-ray showed fracture of distal femoral metaphysis Continue IV fentanyl as needed. Continue fall precautions Orthopedist on-call (Dr. Jena Gauss ) was consulted ,  Patient is s/p ORIF.  POD  #1. Continue adequate pain control.   WBAT LLE as tolerated.  PT and OT eval   Acute diverticulitis: CT chest, abdomen and pelvis was suggestive of diverticulitis. Patient initiated on IV Unasyn.  Continue with Unasyn and de-escalate and discontinue based on cultures and stool cultures. Continue IV hydration and adequate pain control.   Acute diarrhea: Diarrhea improved. Continue treatment as indicated for diverticulitis.   Leukocytosis: WBC 14.3, this is possibly secondary to diverticulitis vs reactive. Continue antibiotics treatment for diverticulitis. WBC trending up to 24.  If continues to trend up,  consider antibiotic change.   Anemia of chronic disease: Hemoglobin 9.6 > dropped to 8.2 > 7.9 >7.4 Patient has chronic blood loss anemia requiring blood transfusion every few months. Transfuse 1 unit PRBC. F/u post transfusion cbc.   AKI on Chronic kidney disease stage IIIb: Creatinine at 1.3, baseline 1.0-1.1 Renally adjust medications,  Avoid nephrotoxic agents/dehydration/hypotension.   History of recurrent DVTs Eliquis temporarily held due to anemia. Resume Eliquis once hemoglobin is stable.   Essential hypertension (controlled) Discontinue triamterene-hydrochlorothiazide due to hyperkalemia.   Chronic diastolic CHF Stable, hold torsemide.  Hyperkalemia Lokelma given.  Monitor serum potassium. Triamterene HCTZ discontinued.:  DVT prophylaxis:  SCDs Code Status: Full code Family Communication: No family at bed side Disposition Plan:    Status is: Inpatient Remains inpatient appropriate because: Admitted to s/p mechanical fall with left femoral fracture.  Also has diverticulitis started on IV Zosyn.   orthopedics is consulted, s/p left ORIF  POD 1.  PT/OT> SNF.   Consultants:  Orthopeadics  Procedures: Antimicrobials:  Anti-infectives (From admission, onward)  Start     Dose/Rate Route Frequency Ordered Stop   04/06/23 1800  Ampicillin-Sulbactam  (UNASYN) 3 g in sodium chloride 0.9 % 100 mL IVPB        3 g 200 mL/hr over 30 Minutes Intravenous Every 12 hours 04/06/23 0756     04/05/23 1754  vancomycin (VANCOCIN) powder  Status:  Discontinued          As needed 04/05/23 1754 04/05/23 1808   04/05/23 1545  ceFAZolin (ANCEF) IVPB 2g/100 mL premix  Status:  Discontinued        2 g 200 mL/hr over 30 Minutes Intravenous On call to O.R. 04/05/23 1516 04/05/23 1550   04/05/23 1529  ceFAZolin (ANCEF) 2-4 GM/100ML-% IVPB  Status:  Discontinued       Note to Pharmacy: Shanda Bumps M: cabinet override      04/05/23 1529 04/05/23 1914   04/04/23 2200  Ampicillin-Sulbactam (UNASYN) 3 g in sodium chloride 0.9 % 100 mL IVPB  Status:  Discontinued        3 g 200 mL/hr over 30 Minutes Intravenous Every 8 hours 04/03/23 2025 04/06/23 0756   04/03/23 1800  Ampicillin-Sulbactam (UNASYN) 3 g in sodium chloride 0.9 % 100 mL IVPB        3 g 200 mL/hr over 30 Minutes Intravenous  Once 04/03/23 1747 04/03/23 2100      Subjective: Patient was seen and examined at bedside. Overnight events noted.   Patient reports doing better, She still reports having pain in the lower abdomen but improved. She reports pain in the left thigh.  She is status post ORIF POD #1.  Objective: Vitals:   04/05/23 1943 04/06/23 0030 04/06/23 0425 04/06/23 0723  BP: 130/63 (!) 134/56 (!) 170/72 (!) 148/69  Pulse: 90 97 91 93  Resp: 16 16 15    Temp: 97.7 F (36.5 C) 97.9 F (36.6 C) 97.8 F (36.6 C) 97.7 F (36.5 C)  TempSrc: Oral Oral Oral Oral  SpO2: 96% 97% 97% 97%  Weight:      Height:        Intake/Output Summary (Last 24 hours) at 04/06/2023 1159 Last data filed at 04/06/2023 1100 Gross per 24 hour  Intake 960 ml  Output 600 ml  Net 360 ml   Filed Weights   04/03/23 1339  Weight: 108 kg    Examination:  General exam: Appears comfortable, deconditioned, not in any acute distress. Respiratory system: CTA bilaterally. Respiratory effort normal. RR  15 Cardiovascular system: S1 & S2 heard, RRR. No murmer. No pedal edema. Gastrointestinal system: Abdomen is soft, mildly tender, non distended.  Normal bowel sounds heard. Central nervous system: Alert and oriented x 3. No focal neurological deficits. Extremities: Left thigh tenderness+, S/p ORIF POD 1 Skin: No rashes, lesions or ulcers Psychiatry: Judgement and insight appear normal. Mood & affect appropriate.     Data Reviewed: I have personally reviewed following labs and imaging studies  CBC: Recent Labs  Lab 04/03/23 1513 04/04/23 0431 04/05/23 0401 04/06/23 0327  WBC 14.3* 15.3* 21.3* 24.4*  NEUTROABS 12.2*  --   --   --   HGB 9.6* 8.2* 7.9* 7.4*  HCT 31.9* 26.9* 27.2* 23.7*  MCV 89.4 90.6 96.5 89.8  PLT 273 303 235 245   Basic Metabolic Panel: Recent Labs  Lab 04/03/23 1513 04/04/23 0431 04/04/23 1105 04/05/23 0401 04/06/23 0327  NA 136 134*  --  132* 131*  K 4.1 5.3* 5.5* 4.6 5.3*  CL 102 102  --  99 99  CO2 25 24  --  24 22  GLUCOSE 118* 117*  --  107* 131*  BUN 27* 26*  --  34* 43*  CREATININE 1.31* 1.54*  --  1.49* 1.68*  CALCIUM 8.5* 8.2*  --  8.1* 8.3*  MG  --  1.4*  --  2.0  --   PHOS  --  5.3*  --  4.0  --    GFR: Estimated Creatinine Clearance: 28.3 mL/min (A) (by C-G formula based on SCr of 1.68 mg/dL (H)). Liver Function Tests: Recent Labs  Lab 04/04/23 0431  AST 63*  ALT 45*  ALKPHOS 164*  BILITOT 2.4*  PROT 4.1*  ALBUMIN 2.4*   No results for input(s): "LIPASE", "AMYLASE" in the last 168 hours. No results for input(s): "AMMONIA" in the last 168 hours. Coagulation Profile: No results for input(s): "INR", "PROTIME" in the last 168 hours. Cardiac Enzymes: No results for input(s): "CKTOTAL", "CKMB", "CKMBINDEX", "TROPONINI" in the last 168 hours. BNP (last 3 results) No results for input(s): "PROBNP" in the last 8760 hours. HbA1C: No results for input(s): "HGBA1C" in the last 72 hours. CBG: No results for input(s): "GLUCAP" in the  last 168 hours. Lipid Profile: No results for input(s): "CHOL", "HDL", "LDLCALC", "TRIG", "CHOLHDL", "LDLDIRECT" in the last 72 hours. Thyroid Function Tests: No results for input(s): "TSH", "T4TOTAL", "FREET4", "T3FREE", "THYROIDAB" in the last 72 hours. Anemia Panel: No results for input(s): "VITAMINB12", "FOLATE", "FERRITIN", "TIBC", "IRON", "RETICCTPCT" in the last 72 hours. Sepsis Labs: No results for input(s): "PROCALCITON", "LATICACIDVEN" in the last 168 hours.  Recent Results (from the past 240 hour(s))  Blood culture (routine x 2)     Status: None (Preliminary result)   Collection Time: 04/03/23  6:22 PM   Specimen: BLOOD  Result Value Ref Range Status   Specimen Description   Final    BLOOD LEFT ANTECUBITAL Performed at [facility], [address], [place], [place] [address]    Special Requests   Final    BOTTLES DRAWN AEROBIC AND ANAEROBIC Blood Culture adequate volume Performed at [facility], [address] [place], [place] [address]    Culture   Final    NO GROWTH 3 DAYS Performed at [facility], [address] [place], [place] [address]    Report Status PENDING  Incomplete  Blood culture (routine x 2)     Status: None (Preliminary result)   Collection Time: 04/03/23  6:37 PM   Specimen: BLOOD  Result Value Ref Range Status   Specimen Description   Final    BLOOD RIGHT ANTECUBITAL Performed at [facility], [address] [place], [place] [address]    Special Requests   Final    BOTTLES DRAWN AEROBIC AND ANAEROBIC Blood Culture adequate volume Performed at [facility], [address] [place], [place] [address]    Culture   Final    NO GROWTH 3 DAYS Performed at [facility], [address], [place], [place] [address]    Report Status PENDING  Incomplete  Surgical pcr screen     Status: None   Collection Time: 04/05/23  8:06 AM   Specimen: Nasal Mucosa; Nasal Swab  Result Value Ref Range  Status   MRSA, PCR NEGATIVE NEGATIVE Final   Staphylococcus aureus NEGATIVE NEGATIVE Final    Comment: (NOTE) The Xpert SA Assay (FDA approved for NASAL specimens in patients 82 years of age and older), is  one component of a comprehensive surveillance program. It is not intended to diagnose infection nor to guide or monitor treatment. Performed at [facility], [address], [place], [place] [address]     Radiology Studies: DG Knee Left Port  Result Date: 04/05/2023 CLINICAL DATA:  Fracture, postop. EXAM: PORTABLE LEFT KNEE - 1-2 VIEW COMPARISON:  Preoperative imaging. FINDINGS: Lateral plate and multi screw fixation fixating distal femur fracture. Improved fracture alignment from preoperative imaging. Prior total knee arthroplasty. Recent postsurgical change includes air and edema in the soft tissues. IMPRESSION: ORIF of distal femur fracture without immediate postoperative complication. Electronically Signed   By: Narda Rutherford M.D.   On: 04/05/2023 20:15   DG Ankle Complete Left  Result Date: 04/05/2023 CLINICAL DATA:  Fracture. EXAM: LEFT ANKLE COMPLETE - 3+ VIEW COMPARISON:  None Available. FINDINGS: The bones are under mineralized. Distal fibular fracture appears remote with surrounding callus formation. No evidence of acute fracture. There is tibial talar joint space narrowing with subchondral cystic change and spurring. No ankle joint effusion. Irregular plantar calcaneal spur with small Achilles tendon enthesophyte. Diffuse soft tissue calcifications about the lower leg. IMPRESSION: 1. Remote distal fibular fracture with surrounding callus formation. No evidence of acute fracture. 2. Tibiotalar osteoarthritis. 3. Irregular plantar calcaneal spur. Electronically Signed   By: Narda Rutherford M.D.   On: 04/05/2023 19:03   DG FEMUR MIN 2 VIEWS LEFT  Result Date: 04/05/2023 CLINICAL DATA:  ORIF distal femur fracture. EXAM: LEFT FEMUR 2 VIEWS COMPARISON:  Preoperative imaging.  FINDINGS: Four fluoroscopic spot views of the left femur obtained in the operating room. Plate and screw fixation of distal femur fracture. Previous knee arthroplasty. Fluoroscopy time 54.6 seconds. Dose 5.85 mGy. IMPRESSION: Intraoperative fluoroscopy during ORIF of distal femur fracture. Electronically Signed   By: Narda Rutherford M.D.   On: 04/05/2023 19:01   DG C-Arm 1-60 Min-No Report  Result Date: 04/05/2023 Fluoroscopy was utilized by the requesting physician.  No radiographic interpretation.    Scheduled Meds:  sodium chloride   Intravenous Once   acetaminophen  1,000 mg Oral Q6H   docusate sodium  100 mg Oral BID   gabapentin  300 mg Oral q morning   gabapentin  600 mg Oral Q supper   melatonin  5 mg Oral QHS   polyethylene glycol  17 g Oral Daily   senna-docusate  1 tablet Oral BID   sodium zirconium cyclosilicate  10 g Oral Once   Continuous Infusions:  sodium chloride     ampicillin-sulbactam (UNASYN) IV     methocarbamol (ROBAXIN) IV       LOS: 3 days    Time spent: 35 mins    Willeen Niece, MD [facility]   If 7PM-7AM, please contact night-coverage

## 2023-04-06 NOTE — TOC Initial Note (Signed)
Transition of Care [facility]) - Initial/Assessment Note    Patient Details  Name: Chelsea Keller MRN: [id number] Date of Birth: 09-28-1934  Transition of Care [facility]) CM/SW Contact:    Lorri Frederick, LCSW Phone Number: 04/06/2023, 3:54 PM  Clinical Narrative:     CSW met with pt regarding PT recommendation for SNF.  Pt from home with daughter Arline Asp, no current services.  Permission given to speak with Arline Asp and any of her other daughters.  Pt is agreeable to SNF, has been to [place] before and would like to return.    CSW left message with daughter Arline Asp.  CSW reached out to [place] to review.               Expected Discharge Plan: Skilled Nursing Facility Barriers to Discharge: Continued Medical Work up   Patient Goals and CMS Choice Patient states their goals for this hospitalization and ongoing recovery are:: walk again   Choice offered to / list presented to : Patient (would like [facility])      Expected Discharge Plan and Services In-house Referral: Clinical Social Work   Post Acute Care Choice: Skilled Nursing Facility Living arrangements for the past 2 months: Single Family Home                                      Prior Living Arrangements/Services Living arrangements for the past 2 months: Single Family Home Lives with:: Adult Children (daughter Aram Beecham) Patient language and need for interpreter reviewed:: Yes Do you feel safe going back to the place where you live?: Yes      Need for Family Participation in Patient Care: Yes (Comment) Care giver support system in place?: Yes (comment) Current home services: Other (comment) (none) Criminal Activity/Legal Involvement Pertinent to Current Situation/Hospitalization: No - Comment as needed  Activities of Daily Living      Permission Sought/Granted Permission sought to share information with : Family Supports Permission granted to share information with : Yes, Verbal Permission Granted  Share Information  with NAME: daughter Arline Asp  Permission granted to share info w AGENCY: SNF        Emotional Assessment Appearance:: Appears stated age Attitude/Demeanor/Rapport: Engaged Affect (typically observed): Appropriate, Pleasant Orientation: : Oriented to Self, Oriented to Place, Oriented to  Time, Oriented to Situation      Admission diagnosis:  Generalized abdominal pain [R10.84] Closed fracture of distal end of femur, unspecified fracture morphology, initial encounter (HCC) [S72.409A] Patient Active Problem List   Diagnosis Date Noted   Closed fracture of distal end of femur, unspecified fracture morphology, initial encounter (HCC) 04/03/2023   Acute diverticulitis 04/03/2023   Leukocytosis 04/03/2023   Chronic kidney disease, stage 3b (HCC) 04/03/2023   Recurrent deep vein thrombosis (DVT) (HCC) 04/03/2023   Periumbilical abdominal pain 03/15/2023   Cellulitis of right leg 02/08/2023   UTI (urinary tract infection) 11/12/2022   Chest pain 11/12/2022   Chest pain at rest 11/12/2022   Left leg pain 12/15/2021   Exacerbation of gout 07/24/2021   Pruritic disorder 12/07/2019   Anemia of chronic disease 11/20/2019   Recurrent pulmonary emboli (HCC) 06/18/2019   Vertigo 01/12/2019   Aortic atherosclerosis (HCC) 09/12/2018   Iron deficiency anemia due to chronic blood loss 08/11/2018   Acute diarrhea 04/20/2018   Gout 04/01/2018   Symptomatic anemia 03/21/2018   Degenerative joint disease (DJD) of hip, Bilateral  11/12/2017   DDD (degenerative  disc disease), lumbar 11/12/2017   Chronic anticoagulation 11/07/2017   Neurodermatitis 07/28/2017   Urinary incontinence 05/26/2017   Chronic diastolic CHF (congestive heart failure) (HCC) 04/03/2017   Hypertension 04/03/2017   Vitamin B12 deficiency 04/03/2017   Chronic kidney disease, stage 3a (HCC) 04/03/2017   Lumbar radiculopathy    Advance directive discussed with patient 01/19/2017   Mood disorder (HCC) 11/03/2013   Psoriasis  02/21/2013   Routine general medical examination at a health care facility 06/14/2012   Morbid obesity (HCC) 12/02/2011   VENTRICULAR HYPERTROPHY, LEFT 10/19/2008   Venous (peripheral) insufficiency 08/11/2007   Osteoarthritis, multiple sites 08/11/2007   B12 DEFICIENCY 09/30/2006   Essential hypertension 09/30/2006   Allergic rhinitis due to pollen 09/30/2006   PCP:  [name], MD Pharmacy:   [facility] [phone] - [address], [place] - [address] [place] [address] Phone: [phone] Fax: [phone]  [facility] - [place], [place] - [address] [place] [address] Phone: [phone] Fax: [phone]     Social Determinants of Health (SDOH) Social History: SDOH Screenings   Food Insecurity: No Food Insecurity (11/12/2022)  Housing: Low Risk  (11/12/2022)  Transportation Needs: No Transportation Needs (11/12/2022)  Utilities: Not At Risk (11/12/2022)  Depression (PHQ2-9): Low Risk  (02/01/2023)  Recent Concern: Depression (PHQ2-9) - Medium Risk (02/01/2023)  Tobacco Use: Medium Risk (04/05/2023)   SDOH Interventions:     Readmission Risk Interventions    11/13/2022   12:33 PM  Readmission Risk Prevention Plan  Post Dischage Appt Complete  Medication Screening Complete  Transportation Screening Complete

## 2023-04-06 NOTE — Care Management Important Message (Signed)
Important Message  Patient Details  Name: Chelsea Keller MRN: [id number] Date of Birth: 1935/05/22   Important Message Given:  Yes - Medicare IM     Sherilyn Banker 04/06/2023, 3:43 PM

## 2023-04-06 NOTE — Plan of Care (Signed)
°  Problem: Clinical Measurements: °Goal: Ability to maintain clinical measurements within normal limits will improve °Outcome: Progressing °Goal: Will remain free from infection °Outcome: Progressing °Goal: Diagnostic test results will improve °Outcome: Progressing °Goal: Respiratory complications will improve °Outcome: Progressing °  °

## 2023-04-07 ENCOUNTER — Inpatient Hospital Stay (HOSPITAL_COMMUNITY): Payer: Medicare Other

## 2023-04-07 DIAGNOSIS — S72409A Unspecified fracture of lower end of unspecified femur, initial encounter for closed fracture: Secondary | ICD-10-CM | POA: Diagnosis not present

## 2023-04-07 DIAGNOSIS — M79661 Pain in right lower leg: Secondary | ICD-10-CM | POA: Diagnosis not present

## 2023-04-07 LAB — TYPE AND SCREEN
ABO/RH(D): B POS
Antibody Screen: NEGATIVE
Unit division: 0
Unit division: 0

## 2023-04-07 LAB — URINALYSIS, ROUTINE W REFLEX MICROSCOPIC
Bilirubin Urine: NEGATIVE
Glucose, UA: NEGATIVE mg/dL
Hgb urine dipstick: NEGATIVE
Ketones, ur: NEGATIVE mg/dL
Nitrite: NEGATIVE
Protein, ur: NEGATIVE mg/dL
Specific Gravity, Urine: 1.021 (ref 1.005–1.030)
pH: 5 (ref 5.0–8.0)

## 2023-04-07 LAB — BASIC METABOLIC PANEL
Anion gap: 13 (ref 5–15)
BUN: 54 mg/dL — ABNORMAL HIGH (ref 8–23)
CO2: 18 mmol/L — ABNORMAL LOW (ref 22–32)
Calcium: 8.1 mg/dL — ABNORMAL LOW (ref 8.9–10.3)
Chloride: 102 mmol/L (ref 98–111)
Creatinine, Ser: 1.41 mg/dL — ABNORMAL HIGH (ref 0.44–1.00)
GFR, Estimated: 36 mL/min — ABNORMAL LOW (ref >60)
Glucose, Bld: 101 mg/dL — ABNORMAL HIGH (ref 70–99)
Potassium: 4.4 mmol/L (ref 3.5–5.1)
Sodium: 133 mmol/L — ABNORMAL LOW (ref 135–145)

## 2023-04-07 LAB — CBC
HCT: 30.6 % — ABNORMAL LOW (ref 36.0–46.0)
Hemoglobin: 10.1 g/dL — ABNORMAL LOW (ref 12.0–15.0)
MCH: 28.5 pg (ref 26.0–34.0)
MCHC: 33 g/dL (ref 30.0–36.0)
MCV: 86.4 fL (ref 80.0–100.0)
Platelets: 152 10*3/uL (ref 150–400)
RBC: 3.54 MIL/uL — ABNORMAL LOW (ref 3.87–5.11)
RDW: 20.8 % — ABNORMAL HIGH (ref 11.5–15.5)
WBC: 25.1 10*3/uL — ABNORMAL HIGH (ref 4.0–10.5)
nRBC: 1.1 % — ABNORMAL HIGH (ref 0.0–0.2)

## 2023-04-07 LAB — BPAM RBC
Blood Product Expiration Date: 202411012359
Blood Product Expiration Date: 202411012359
ISSUE DATE / TIME: 202410011347
ISSUE DATE / TIME: 202410011717
Unit Type and Rh: 7300
Unit Type and Rh: 7300

## 2023-04-07 LAB — PHOSPHORUS: Phosphorus: 5.3 mg/dL — ABNORMAL HIGH (ref 2.5–4.6)

## 2023-04-07 LAB — PROCALCITONIN: Procalcitonin: 1.41 ng/mL

## 2023-04-07 LAB — MAGNESIUM: Magnesium: 1.8 mg/dL (ref 1.7–2.4)

## 2023-04-07 MED ORDER — METHOCARBAMOL 500 MG PO TABS: 500.0000 mg | ORAL_TABLET | Freq: Four times a day (QID) | ORAL | 0 refills | Status: DC | PRN

## 2023-04-07 MED ORDER — OXYCODONE HCL 5 MG PO TABS: 5.0000 mg | ORAL_TABLET | ORAL | 0 refills | Status: DC | PRN

## 2023-04-07 MED ORDER — VITAMIN D (ERGOCALCIFEROL) 1.25 MG (50000 UNIT) PO CAPS
50000.0000 [IU] | ORAL_CAPSULE | ORAL | Status: DC
  Administered 2023-04-07 – 2023-04-14 (×2): 50000 [IU] via ORAL
  Filled 2023-04-07 (×2): qty 1

## 2023-04-07 MED ORDER — PIPERACILLIN-TAZOBACTAM 3.375 G IVPB
3.3750 g | Freq: Three times a day (TID) | INTRAVENOUS | Status: DC
  Administered 2023-04-07 – 2023-04-09 (×6): 3.375 g via INTRAVENOUS
  Filled 2023-04-07 (×6): qty 50

## 2023-04-07 MED ORDER — AMLODIPINE BESYLATE 5 MG PO TABS
5.0000 mg | ORAL_TABLET | Freq: Every day | ORAL | Status: DC
  Administered 2023-04-07 – 2023-04-15 (×9): 5 mg via ORAL
  Filled 2023-04-07 (×9): qty 1

## 2023-04-07 MED ORDER — FUROSEMIDE 20 MG PO TABS
20.0000 mg | ORAL_TABLET | Freq: Every day | ORAL | Status: DC
  Administered 2023-04-07 – 2023-04-08 (×2): 20 mg via ORAL
  Filled 2023-04-07 (×2): qty 1

## 2023-04-07 MED ORDER — VITAMIN D (ERGOCALCIFEROL) 1.25 MG (50000 UNIT) PO CAPS: 50000.0000 [IU] | ORAL_CAPSULE | ORAL | 0 refills | Status: DC

## 2023-04-07 MED ORDER — APIXABAN 2.5 MG PO TABS
2.5000 mg | ORAL_TABLET | Freq: Two times a day (BID) | ORAL | Status: DC
  Administered 2023-04-07 – 2023-04-15 (×17): 2.5 mg via ORAL
  Filled 2023-04-07 (×17): qty 1

## 2023-04-07 NOTE — TOC Progression Note (Signed)
Transition of Care [facility]) - Progression Note    Patient Details  Name: Chelsea Keller MRN: [id number] Date of Birth: Jun 21, 1935  Transition [facility]) CM/SW Contact  Lorri Frederick, LCSW Phone Number: 04/07/2023, 9:50 AM  Clinical Narrative:   Chelsea Keller does offer bed, CSW informed pt who remains agreeable, but was sleepy this AM.  CSW spoke with daughter Arline Asp, who is also in agreement with accepting bed at [facility].   CSW confirmed with [place].      Expected Discharge Plan: Skilled Nursing Facility Barriers to Discharge: Continued Medical Work up  Expected Discharge Plan and Services In-house Referral: Clinical Social Work   Post Acute Care Choice: Skilled Nursing Facility Living arrangements for the past 2 months: Single Family Home                                       Social Determinants of Health (SDOH) Interventions SDOH Screenings   Food Insecurity: No Food Insecurity (11/12/2022)  Housing: Low Risk  (11/12/2022)  Transportation Needs: No Transportation Needs (11/12/2022)  Utilities: Not At Risk (11/12/2022)  Depression (PHQ2-9): Low Risk  (02/01/2023)  Recent Concern: Depression (PHQ2-9) - Medium Risk (02/01/2023)  Tobacco Use: Medium Risk (04/05/2023)    Readmission Risk Interventions    11/13/2022   12:33 PM  Readmission Risk Prevention Plan  Post Dischage Appt Complete  Medication Screening Complete  Transportation Screening Complete

## 2023-04-07 NOTE — Progress Notes (Signed)
Infusion completed, pt tolerated infusion well, no adverse reaction.  04/06/23 2012  Pre-Transfusion Documentation  Blood Consent Obtained Yes  Blood Consent Date 04/06/23  History of previous reaction? No  Pre-Meds Given? No  Patient education Done  Vitals  Temp 98.1 F (36.7 C)  Temp Source Oral  Pulse Rate 92  Resp 15  BP (!) 152/74  Oxygen Therapy  SpO2 95 %  O2 Device Room Air  Transfuse RBC  Volume 340  Blood Admin Supplies Y set with filter  Intake (mL)  NS for Blood Administration 250 mL

## 2023-04-07 NOTE — Progress Notes (Signed)
MEDICATION-RELATED CONSULT NOTE   Fracture Care Post-Operative Anticoagulation Per Pharmacy Consult  Procedure: ORIF distal femur fracture    Completed:  04/05/23  Anticoagulant on inpatient or PTA profile prior to procedure:   Eliquis 2.5mg  PO BID for hx recurrent DVT's   Recommended restart time per consult:  Resume once hgb >/= 8 and no transfusions  Other considerations:  Hgb 7.4 >> 6.9 yesterday and was transfused. D/w ortho 10/2 - hgb up to 10.1 s/p transfusion. Ok to resume Eliquis given hx of recurrent DVTs.   Plan:     Eliquis 2.5mg  PO BID    Rexford Maus, PharmD, BCPS 04/07/2023 8:43 AM

## 2023-04-07 NOTE — Progress Notes (Addendum)
Attempt 2x to reach phlebotomy 5N - no answer.

## 2023-04-07 NOTE — Progress Notes (Signed)
Physical Therapy Treatment Patient Details Name: Chelsea Keller MRN: [id number] DOB: 05-17-1935 Today's Date: 04/07/2023   History of Present Illness Pt is 88 y.o. female s/p left ORIF left femur fx sustained from a mechanical fall at home. PMH: chronic diastolic CHF, essential hypertension, chronic blood loss anemia requiring blood transfusion, morbid obesity    PT Comments  Pt lethargic, but easily arousal, just keeps falling asleep throughout session. She is very weak even on her "good" extremities (unable to perform a SLR on her right leg, unable to push/pull with R arm d/t h/o fall with fracture to R shoulder).  She will likely be a total lift transfer until she can be WBAT.  We did initiate review of LE HEP and handout given.  I encouraged her to do ankle pumps and quad sets throughout the day.  I turned on all of the lights and put the bed in chair position so that she could eat breakfast and hopefully wake up a bit more.  Continue to work on EOB or OOB to chair with a lift (which she reports she will not be able to tolerate sitting in the recliner long at all due to h/o chronic low back pain). PT will continue to follow acutely for safe mobility progression.   If plan is discharge home, recommend the following: Two people to help with bathing/dressing/bathroom;Two people to help with walking and/or transfers;Assistance with cooking/housework;Assistance with feeding;Assist for transportation   Can travel by private vehicle     No  Equipment Recommendations  Hoyer lift;Wheelchair cushion (measurements PT);Wheelchair (measurements PT);Hospital bed (will need bariatric/wide equipment)    Recommendations for Other Services       Precautions / Restrictions Precautions Precautions: Fall Precaution Comments: pt with h/o >3 falls with fracture/major injury Restrictions Weight Bearing Restrictions: Yes LLE Weight Bearing: Touchdown weight bearing     Mobility  Bed Mobility Overal bed  mobility: Needs Assistance Bed Mobility: Supine to Sit, Sit to Supine     Supine to sit: Total assist, HOB elevated, Used rails     General bed mobility comments: Supine to long sitting using bed rail total assist pt only able to pull with left arm as right arm is not very functional (h/o fall with bil "shoulder fx" s/p repair and R arm never recovered)    Transfers Overall transfer level: Needs assistance                 General transfer comment: Realistically, pt will only be able to be OOB in the recliner chair or WC with a total lift.  She is TDWB on her left leg and her right leg is generally weak (unable to do a SLR on the right without assistance).    Ambulation/Gait               General Gait Details: unable in current condition   Stairs             Wheelchair Mobility     Tilt Bed    Modified Rankin (Stroke Patients Only)       Balance   Sitting-balance support: Single extremity supported Sitting balance-Leahy Scale: Zero Sitting balance - Comments: unable to sit up without significant assist                                    Cognition Arousal: Lethargic Behavior During Therapy: WFL for tasks assessed/performed Overall Cognitive Status: Within  Functional Limits for tasks assessed                                 General Comments: Pt falling asleep throughout session, difficult to keep awake/aroused.  Turned on lights and sat pt up as far as she could tolerate to eat breakfast.        Exercises Total Joint Exercises Ankle Circles/Pumps: AAROM, Both, 10 reps Quad Sets: AROM, Left, 5 reps Heel Slides: AAROM, Both, 10 reps Hip ABduction/ADduction: AAROM, Both, 10 reps Straight Leg Raises: AAROM, Right, 10 reps    General Comments        Pertinent Vitals/Pain Pain Assessment Pain Assessment: Faces Faces Pain Scale: Hurts even more Pain Location: left leg Pain Descriptors / Indicators: Operative site  guarding, Grimacing Pain Intervention(s): Limited activity within patient's tolerance, Monitored during session, Repositioned    Home Living                          Prior Function            PT Goals (current goals can now be found in the care plan section) Acute Rehab PT Goals Patient Stated Goal: to get back home Progress towards PT goals: Progressing toward goals    Frequency    Min 1X/week      PT Plan      Co-evaluation              AM-PAC PT "6 Clicks" Mobility   Outcome Measure  Help needed turning from your back to your side while in a flat bed without using bedrails?: Total Help needed moving from lying on your back to sitting on the side of a flat bed without using bedrails?: Total Help needed moving to and from a bed to a chair (including a wheelchair)?: Total Help needed standing up from a chair using your arms (e.g., wheelchair or bedside chair)?: Total Help needed to walk in hospital room?: Total Help needed climbing 3-5 steps with a railing? : Total 6 Click Score: 6    End of Session   Activity Tolerance: Patient limited by lethargy Patient left: in bed;with call bell/phone within reach   PT Visit Diagnosis: Other abnormalities of gait and mobility (R26.89);Pain Pain - Right/Left: Left Pain - part of body: Leg     Time: 1610-9604 PT Time Calculation (min) (ACUTE ONLY): 25 min  Charges:    $Therapeutic Exercise: 8-22 mins $Therapeutic Activity: 8-22 mins PT General Charges $$ ACUTE PT VISIT: 1 Visit                     Corinna Capra, PT, DPT  Acute Rehabilitation Secure chat is best for contact #[phone] office

## 2023-04-07 NOTE — Plan of Care (Signed)
  Problem: Education: Goal: Knowledge of General Education information will improve Description: Including pain rating scale, medication(s)/side effects and non-pharmacologic comfort measures 04/07/2023 1505 by Gaspar Cola, RN Outcome: Progressing 04/07/2023 0815 by Gaspar Cola, RN Outcome: Progressing   Problem: Clinical Measurements: Goal: Will remain free from infection Outcome: Progressing   Problem: Activity: Goal: Risk for activity intolerance will decrease 04/07/2023 1505 by Gaspar Cola, RN Outcome: Progressing 04/07/2023 0815 by Gaspar Cola, RN Outcome: Progressing   Problem: Nutrition: Goal: Adequate nutrition will be maintained Outcome: Progressing   Problem: Coping: Goal: Level of anxiety will decrease 04/07/2023 1505 by Gaspar Cola, RN Outcome: Progressing 04/07/2023 0815 by Gaspar Cola, RN Outcome: Progressing

## 2023-04-07 NOTE — Progress Notes (Addendum)
Orthopaedic Trauma Progress Note  SUBJECTIVE: Doing better today.  Notes less confusion.  Slept better overnight.  Reports pain in left leg is moderate.  Denies any significant numbness or tingling throughout the leg.  No chest pain. No SOB. No nausea/vomiting. No other complaints.  Had difficulty progressing OOB with therapies yesterday due to pain and dizziness.  Did receive 1 unit PRBCs for hemoglobin of 7.4.  She tolerated this well.  U/A with some leukocytes present.  Culture pending. Hyperkalemia resolved.   OBJECTIVE:  Vitals:   04/07/23 0632 04/07/23 0750  BP: (!) 142/79 (!) 152/74  Pulse: 98 (!) 104  Resp:  18  Temp:  (!) 97.5 F (36.4 C)  SpO2:  96%    General: Laying in bed comfortably, no acute distress.  Sleepy but alert and oriented to person/place Respiratory: No increased work of breathing.  Left lower extremity: Dressing removed, incisions are clean, dry, intact.  Moderate bruising about the knee to be expected.  Tender to palpation over the knee and and throughout the lateral thigh as expected.  No significant calf tenderness.  Tolerates gentle ankle range of motion.  Able to wiggle toes.  Endorses sensation of light touch over all aspects of the foot.  Neurovascularly intact.  IMAGING: Stable post op imaging.   LABS:  Results for orders placed or performed during the hospital encounter of 04/03/23 (from the past 24 hour(s))  VITAMIN D 25 Hydroxy (Vit-D Deficiency, Fractures)     Status: Abnormal   Collection Time: 04/06/23 12:14 PM  Result Value Ref Range   Vit D, 25-Hydroxy 5.84 (L) 30 - 100 ng/mL  Potassium     Status: None   Collection Time: 04/06/23 12:14 PM  Result Value Ref Range   Potassium 4.9 3.5 - 5.1 mmol/L  Hemoglobin and hematocrit, blood     Status: Abnormal   Collection Time: 04/06/23 12:14 PM  Result Value Ref Range   Hemoglobin 6.6 (LL) 12.0 - 15.0 g/dL   HCT 16.1 (L) 09.6 - 04.5 %  Prepare RBC (crossmatch)     Status: None   Collection Time:  04/06/23  5:00 PM  Result Value Ref Range   Order Confirmation      ORDER PROCESSED BY BLOOD BANK Performed at [facility], [address], [place], [place] [address]   Basic metabolic panel     Status: Abnormal   Collection Time: 04/07/23  5:18 AM  Result Value Ref Range   Sodium 133 (L) 135 - 145 mmol/L   Potassium 4.4 3.5 - 5.1 mmol/L   Chloride 102 98 - 111 mmol/L   CO2 18 (L) 22 - 32 mmol/L   Glucose, Bld 101 (H) 70 - 99 mg/dL   BUN 54 (H) 8 - 23 mg/dL   Creatinine, Ser 1.91 (H) 0.44 - 1.00 mg/dL   Calcium 8.1 (L) 8.9 - 10.3 mg/dL   GFR, Estimated 36 (L) >60 mL/min   Anion gap 13 5 - 15  CBC     Status: Abnormal   Collection Time: 04/07/23  5:18 AM  Result Value Ref Range   WBC 25.1 (H) 4.0 - 10.5 K/uL   RBC 3.54 (L) 3.87 - 5.11 MIL/uL   Hemoglobin 10.1 (L) 12.0 - 15.0 g/dL   HCT 47.8 (L) 29.5 - 62.1 %   MCV 86.4 80.0 - 100.0 fL   MCH 28.5 26.0 - 34.0 pg   MCHC 33.0 30.0 - 36.0 g/dL   RDW 30.8 (H) 65.7 - 84.6 %  Platelets 152 150 - 400 K/uL   nRBC 1.1 (H) 0.0 - 0.2 %  Magnesium     Status: None   Collection Time: 04/07/23  5:18 AM  Result Value Ref Range   Magnesium 1.8 1.7 - 2.4 mg/dL  Phosphorus     Status: Abnormal   Collection Time: 04/07/23  5:18 AM  Result Value Ref Range   Phosphorus 5.3 (H) 2.5 - 4.6 mg/dL  Urinalysis, Routine w reflex microscopic -Urine, Clean Catch     Status: Abnormal   Collection Time: 04/07/23  6:32 AM  Result Value Ref Range   Color, Urine YELLOW YELLOW   APPearance CLOUDY (A) CLEAR   Specific Gravity, Urine 1.021 1.005 - 1.030   pH 5.0 5.0 - 8.0   Glucose, UA NEGATIVE NEGATIVE mg/dL   Hgb urine dipstick NEGATIVE NEGATIVE   Bilirubin Urine NEGATIVE NEGATIVE   Ketones, ur NEGATIVE NEGATIVE mg/dL   Protein, ur NEGATIVE NEGATIVE mg/dL   Nitrite NEGATIVE NEGATIVE   Leukocytes,Ua SMALL (A) NEGATIVE   RBC / HPF 11-20 0 - 5 RBC/hpf   WBC, UA 21-50 0 - 5 WBC/hpf   Bacteria, UA RARE (A) NONE SEEN   Squamous Epithelial / HPF  21-50 0 - 5 /HPF   Mucus PRESENT    Hyaline Casts, UA PRESENT     ASSESSMENT: Chelsea Keller is a 88 y.o. female, 2 Days Post-Op s/p OPEN REDUCTION INTERNAL FIXATION LEFT DISTAL FEMUR FRACTURE  CV/Blood loss: Acute blood loss anemia, Hgb 10.1 this morning. Received 1 unit PRBCs 04/06/23. Hemodynamically stable  PLAN: Weightbearing: TDWB LLE ROM: Unrestricted ROM Incisional and dressing care: Dressing removed today, change PRN Showering: Okay to begin showering and getting incisions wet 04/08/2023 Orthopedic device(s): None  Pain management:  1. Tylenol 1000 mg q 6 hours scheduled 2. Robaxin 500 mg q 6 hours PRN 3. Oxycodone 5 mg q 4 hours PRN 4. Dilaudid 0.5 mg q 3 hours PRN 5. Tramadol 50 mg q 6 hours PRN 6. Gabapentin 300-600 mg BID VTE prophylaxis: Restart Eliquis today. SCDs ID: Currently on Unasyn for diverticulitis.   Foley/Lines:  No foley, KVO IVFs Impediments to Fracture Healing: Vitamin D level 5, started on Vit D2 supplementation Dispo: PT/OT evaluation ongoing, recommending SNF. Patient and daughter agreeable. TOC following for placement.  Ok for d/c from ortho standpoint once cleared by medicine team and therapies.   D/C recommendations: -Oxycodone 5 mg, Robaxin for pain control -Home dose Eliquis for DVT prophylaxis -Continue 50,000 units Vit D2 supplementation weekly x 5 weeks  Follow - up plan: 2 weeks after discharge for wound check and repeat x-rays   Contact information:  Truitt Merle MD, Thyra Breed PA-C. After hours and holidays please check [email or web address] for group call information for [facility]   Thompson Caul, PA-C [phone] (office) [email or web address]

## 2023-04-07 NOTE — Progress Notes (Signed)
PROGRESS NOTE  Chelsea Keller  ZOX:[id number] DOB: [birth year] DOA: 04/03/2023 PCP: [name], MD   Brief Narrative: Patient is a 88 year old female with history of chronic diastolic CHF, hypertension, chronic blood loss anemia requiring blood transfusion, morbid obesity who presented to the emergency department with complaint of fall at home when she lost her balance while coming out of the bathroom.  She was also having 4 to 5 weeks onset of abdominal pain.  Left knee x-ray showed acute comminuted fracture of distal femoral metaphysis at the level of femoral complaint of knee replacement.  Orthopedics consulted, status post ORIF.  CT imaging showed pericolonic inflammation along the descending colon suggesting diverticulitis.  Started antibiotics.  PT/OT recommending SNF.  Hospital course remarkable for persistent leukocytosis.  Plan for follow-up CT scan today  Assessment & Plan:  Principal Problem:   Closed fracture of distal end of femur, unspecified fracture morphology, initial encounter (HCC) Active Problems:   Essential hypertension   Chronic diastolic CHF (congestive heart failure) (HCC)   Acute diarrhea   Anemia of chronic disease   Acute diverticulitis   Leukocytosis   Chronic kidney disease, stage 3b (HCC)   Recurrent deep vein thrombosis (DVT) (HCC)  Acute commuted fracture of distal femoral metaphysis on the left: Orthopedics consulted, status post ORIF.  PT/OT recommending SNF on discharge.  Continue pain management, supportive care, bowel regimen.  Orthopedics recommending follow-up as an outpatient after 2 weeks for wound check and repeat x-rays.  Diverticulitis: She was having 4 to 5 weeks history of lower abdominal pain.  CT imaging done on admission showed  pericolonic inflammation along the descending colon suggesting diverticulitis.  Her leukocytosis has persisted.  Will check a repeat CT abdomen/pelvis without contrast today.  Antibiotics changed to Zosyn.  Blood  cultures have remained negative so far.  She does not complain of any abdomen pain or dysuria today.  Abdomen is soft, nondistended, nontender with good bowel sounds.  Suspected UTI: Patient was confused, has leukocytosis, UA done on 10/2 shows some leukocytes.  Follow-up urine culture.  Patient already on antibiotics  Chronic normocytic anemia: Follows with hematology, Dr. Pamelia Hoit  had blood transfusions in the past.  Monitor hemoglobin  AKI in CKD stage IIIb: Currently kidney function at baseline.  Avoid nephrotoxins  History of recurrent DVTs: On Eliquis.  Eliquis  restarted   Hypertension: Triamterene-hydrochlorothiazide discontinued.  Monitor blood pressure, will start on amlodipine low-dose.  Chronic diastolic CHF: Home torsemide on hold due to AKI.  She says this does not take torsemide  anymore at home.  Currently appears euvolemic.  She might need low-dose Lasix on discharge  Hyperkalemia: Resolved  Acute diarrhea: Improved       DVT prophylaxis:apixaban (ELIQUIS) tablet 2.5 mg Start: 04/07/23 1000 SCDs Start: 04/06/23 0816 SCDs Start: 04/03/23 2019 apixaban (ELIQUIS) tablet 2.5 mg     Code Status: Full Code  Family Communication: None at bedside  Patient status:Inpatient  Patient is from :Home  Anticipated discharge to:SNF  Estimated DC date: After improvement in the leukocytosis   Consultants: Orthopedics  Procedures: ORIF  Antimicrobials:  Anti-infectives (From admission, onward)    Start     Dose/Rate Route Frequency Ordered Stop   04/06/23 2200  Ampicillin-Sulbactam (UNASYN) 3 g in sodium chloride 0.9 % 100 mL IVPB  Status:  Discontinued        3 g 200 mL/hr over 30 Minutes Intravenous Every 12 hours 04/06/23 0756 04/07/23 0947   04/05/23 1754  vancomycin (VANCOCIN) powder  Status:  Discontinued          As needed 04/05/23 1754 04/05/23 1808   04/05/23 1545  ceFAZolin (ANCEF) IVPB 2g/100 mL premix  Status:  Discontinued        2 g 200 mL/hr over 30  Minutes Intravenous On call to O.R. 04/05/23 1516 04/05/23 1550   04/05/23 1529  ceFAZolin (ANCEF) 2-4 GM/100ML-% IVPB  Status:  Discontinued       Note to Pharmacy: Shanda Bumps M: cabinet override      04/05/23 1529 04/05/23 1914   04/04/23 2200  Ampicillin-Sulbactam (UNASYN) 3 g in sodium chloride 0.9 % 100 mL IVPB  Status:  Discontinued        3 g 200 mL/hr over 30 Minutes Intravenous Every 8 hours 04/03/23 2025 04/06/23 0756   04/03/23 1800  Ampicillin-Sulbactam (UNASYN) 3 g in sodium chloride 0.9 % 100 mL IVPB        3 g 200 mL/hr over 30 Minutes Intravenous  Once 04/03/23 1747 04/03/23 2100       Subjective: Patient seen and examined at bedside today.  Hemodynamically stable /Lying on  bed.  Overall looks comfortable.  Denies any nausea, vomiting or abdominal pain.  Alert, awake but confused to time.  Objective: Vitals:   04/06/23 2012 04/07/23 0522 04/07/23 0632 04/07/23 0750  BP: (!) 152/74 (!) 159/89 (!) 142/79 (!) 152/74  Pulse: 92 (!) 112 98 (!) 104  Resp: 15 18  18   Temp: 98.1 F (36.7 C) 97.6 F (36.4 C)  (!) 97.5 F (36.4 C)  TempSrc: Oral Oral  Oral  SpO2: 95% 93%  96%  Weight:      Height:        Intake/Output Summary (Last 24 hours) at 04/07/2023 0948 Last data filed at 04/07/2023 0146 Gross per 24 hour  Intake 1576 ml  Output 400 ml  Net 1176 ml   Filed Weights   04/03/23 1339  Weight: 108 kg    Examination:  General exam: Overall comfortable, not in distress, morbidly obese HEENT: PERRL Respiratory system:  no wheezes or crackles  Cardiovascular system: S1 & S2 heard, RRR.  Gastrointestinal system: Abdomen is nondistended, soft and nontender. Central nervous system: Alert and awake, commands, oriented to place only Extremities: No edema, no clubbing ,no cyanosis, surgical wound on the left knee Skin: No rashes, no ulcers,no icterus     Data Reviewed: I have personally reviewed following labs and imaging studies  CBC: Recent Labs  Lab  04/03/23 1513 04/04/23 0431 04/05/23 0401 04/06/23 0327 04/06/23 1214 04/07/23 0518  WBC 14.3* 15.3* 21.3* 24.4*  --  25.1*  NEUTROABS 12.2*  --   --   --   --   --   HGB 9.6* 8.2* 7.9* 7.4* 6.6* 10.1*  HCT 31.9* 26.9* 27.2* 23.7* 21.6* 30.6*  MCV 89.4 90.6 96.5 89.8  --  86.4  PLT 273 303 235 245  --  152   Basic Metabolic Panel: Recent Labs  Lab 04/03/23 1513 04/04/23 0431 04/04/23 1105 04/05/23 0401 04/06/23 0327 04/06/23 1214 04/07/23 0518  NA 136 134*  --  132* 131*  --  133*  K 4.1 5.3* 5.5* 4.6 5.3* 4.9 4.4  CL 102 102  --  99 99  --  102  CO2 25 24  --  24 22  --  18*  GLUCOSE 118* 117*  --  107* 131*  --  101*  BUN 27* 26*  --  34* 43*  --  54*  CREATININE 1.31* 1.54*  --  1.49* 1.68*  --  1.41*  CALCIUM 8.5* 8.2*  --  8.1* 8.3*  --  8.1*  MG  --  1.4*  --  2.0  --   --  1.8  PHOS  --  5.3*  --  4.0  --   --  5.3*     Recent Results (from the past 240 hour(s))  Blood culture (routine x 2)     Status: None (Preliminary result)   Collection Time: 04/03/23  6:22 PM   Specimen: BLOOD  Result Value Ref Range Status   Specimen Description   Final    BLOOD LEFT ANTECUBITAL Performed at [facility], [address] [place], [place] [address]    Special Requests   Final    BOTTLES DRAWN AEROBIC AND ANAEROBIC Blood Culture adequate volume Performed at [facility], [address], [place], [place] [address]    Culture   Final    NO GROWTH 4 DAYS Performed at [facility], [address], [place], [place] [address]    Report Status PENDING  Incomplete  Blood culture (routine x 2)     Status: None (Preliminary result)   Collection Time: 04/03/23  6:37 PM   Specimen: BLOOD  Result Value Ref Range Status   Specimen Description   Final    BLOOD RIGHT ANTECUBITAL Performed at [facility], [address], [place], [place] [address]    Special Requests   Final    BOTTLES DRAWN AEROBIC AND ANAEROBIC Blood Culture adequate  volume Performed at [facility], [address], [place], [place] [address]    Culture   Final    NO GROWTH 4 DAYS Performed at [facility], [address] [place], [place] [address]    Report Status PENDING  Incomplete  Surgical pcr screen     Status: None   Collection Time: 04/05/23  8:06 AM   Specimen: Nasal Mucosa; Nasal Swab  Result Value Ref Range Status   MRSA, PCR NEGATIVE NEGATIVE Final   Staphylococcus aureus NEGATIVE NEGATIVE Final    Comment: (NOTE) The Xpert SA Assay (FDA approved for NASAL specimens in patients 49 years of age and older), is one component of a comprehensive surveillance program. It is not intended to diagnose infection nor to guide or monitor treatment. Performed at [facility], [address] [place], [place] [address]      Radiology Studies: DG Knee Left Port  Result Date: 04/05/2023 CLINICAL DATA:  Fracture, postop. EXAM: PORTABLE LEFT KNEE - 1-2 VIEW COMPARISON:  Preoperative imaging. FINDINGS: Lateral plate and multi screw fixation fixating distal femur fracture. Improved fracture alignment from preoperative imaging. Prior total knee arthroplasty. Recent postsurgical change includes air and edema in the soft tissues. IMPRESSION: ORIF of distal femur fracture without immediate postoperative complication. Electronically Signed   By: Narda Rutherford M.D.   On: 04/05/2023 20:15   DG Ankle Complete Left  Result Date: 04/05/2023 CLINICAL DATA:  Fracture. EXAM: LEFT ANKLE COMPLETE - 3+ VIEW COMPARISON:  None Available. FINDINGS: The bones are under mineralized. Distal fibular fracture appears remote with surrounding callus formation. No evidence of acute fracture. There is tibial talar joint space narrowing with subchondral cystic change and spurring. No ankle joint effusion. Irregular plantar calcaneal spur with small Achilles tendon enthesophyte. Diffuse soft tissue calcifications about the lower leg. IMPRESSION: 1.  Remote distal fibular fracture with  surrounding callus formation. No evidence of acute fracture. 2. Tibiotalar osteoarthritis. 3. Irregular plantar calcaneal spur. Electronically Signed   By: Narda Rutherford M.D.   On: 04/05/2023 19:03   DG FEMUR MIN 2 VIEWS LEFT  Result Date: 04/05/2023 CLINICAL DATA:  ORIF distal femur fracture. EXAM: LEFT FEMUR 2 VIEWS COMPARISON:  Preoperative imaging. FINDINGS: Four fluoroscopic spot views of the left femur obtained in the operating room. Plate and screw fixation of distal femur fracture. Previous knee arthroplasty. Fluoroscopy time 54.6 seconds. Dose 5.85 mGy. IMPRESSION: Intraoperative fluoroscopy during ORIF of distal femur fracture. Electronically Signed   By: Narda Rutherford M.D.   On: 04/05/2023 19:01   DG C-Arm 1-60 Min-No Report  Result Date: 04/05/2023 Fluoroscopy was utilized by the requesting physician.  No radiographic interpretation.    Scheduled Meds:  sodium chloride   Intravenous Once   sodium chloride   Intravenous Once   acetaminophen  1,000 mg Oral Q6H   apixaban  2.5 mg Oral BID   docusate sodium  100 mg Oral BID   gabapentin  300 mg Oral q morning   gabapentin  600 mg Oral Q supper   melatonin  5 mg Oral QHS   polyethylene glycol  17 g Oral Daily   senna-docusate  1 tablet Oral BID   Vitamin D (Ergocalciferol)  50,000 Units Oral Q7 days   Continuous Infusions:  sodium chloride     methocarbamol (ROBAXIN) IV       LOS: 4 days   Burnadette Pop, MD [facility] P10/08/2022, 9:48 AM

## 2023-04-07 NOTE — Plan of Care (Signed)
  Problem: Education: Goal: Knowledge of General Education information will improve Description Including pain rating scale, medication(s)/side effects and non-pharmacologic comfort measures Outcome: Progressing   Problem: Activity: Goal: Risk for activity intolerance will decrease Outcome: Progressing   Problem: Coping: Goal: Level of anxiety will decrease Outcome: Progressing   Problem: Elimination: Goal: Will not experience complications related to bowel motility Outcome: Progressing   Problem: Pain Managment: Goal: General experience of comfort will improve Outcome: Progressing   

## 2023-04-07 NOTE — Progress Notes (Signed)
Right lower extremity venous duplex has been completed. Preliminary results can be found in CV Proc through chart review.   04/07/23 1:52 PM Olen Cordial RVT

## 2023-04-07 NOTE — Progress Notes (Signed)
Pharmacy Antibiotic Note  Chelsea Keller is a 88 y.o. female admitted on 04/03/2023 with abdominal pain, found to have diverticulitis on CTAP . Started on Unasyn 9/29, WBC rising. Pharmacy has been consulted for Zosyn dosing.  Plan: Zosyn 3.375g IV q8h (4 hour infusion). Monitor clinical improvement, renal function, ability to narrow/transition to PO  Height: 5\' 3"  (160 cm) Weight: 108 kg (238 lb 1.6 oz) IBW/kg (Calculated) : 52.4  Temp (24hrs), Avg:98 F (36.7 C), Min:97.5 F (36.4 C), Max:98.4 F (36.9 C)  Recent Labs  Lab 04/03/23 1513 04/04/23 0431 04/05/23 0401 04/06/23 0327 04/07/23 0518  WBC 14.3* 15.3* 21.3* 24.4* 25.1*  CREATININE 1.31* 1.54* 1.49* 1.68* 1.41*    Estimated Creatinine Clearance: 33.7 mL/min (A) (by C-G formula based on SCr of 1.41 mg/dL (H)).    Allergies  Allergen Reactions   Codeine Sulfate Shortness Of Breath   Celecoxib Other (See Comments)    Caused vaginal bleeding   Erythromycin Base Nausea And Vomiting   Spironolactone Nausea Only and Other (See Comments)    Blurred vision, Upset stomach, lethargy    Antimicrobials this admission: Unasyn 9/29 >> 10/2 Zosyn 10/2 >>   Dose adjustments this admission:  Microbiology results: 9/28 BCx: ngtd  Thank you for allowing pharmacy to be a part of this patient's care.  Rexford Maus, PharmD, BCPS 04/07/2023 9:53 AM

## 2023-04-07 NOTE — Progress Notes (Signed)
Urine collected and sent to lab @06 :33

## 2023-04-08 DIAGNOSIS — S72409A Unspecified fracture of lower end of unspecified femur, initial encounter for closed fracture: Secondary | ICD-10-CM | POA: Diagnosis not present

## 2023-04-08 LAB — BASIC METABOLIC PANEL
Anion gap: 10 (ref 5–15)
BUN: 62 mg/dL — ABNORMAL HIGH (ref 8–23)
CO2: 24 mmol/L (ref 22–32)
Calcium: 8 mg/dL — ABNORMAL LOW (ref 8.9–10.3)
Chloride: 100 mmol/L (ref 98–111)
Creatinine, Ser: 1.39 mg/dL — ABNORMAL HIGH (ref 0.44–1.00)
GFR, Estimated: 37 mL/min — ABNORMAL LOW (ref >60)
Glucose, Bld: 110 mg/dL — ABNORMAL HIGH (ref 70–99)
Potassium: 4.2 mmol/L (ref 3.5–5.1)
Sodium: 134 mmol/L — ABNORMAL LOW (ref 135–145)

## 2023-04-08 LAB — CBC
HCT: 34 % — ABNORMAL LOW (ref 36.0–46.0)
Hemoglobin: 10.5 g/dL — ABNORMAL LOW (ref 12.0–15.0)
MCH: 27.3 pg (ref 26.0–34.0)
MCHC: 30.9 g/dL (ref 30.0–36.0)
MCV: 88.3 fL (ref 80.0–100.0)
Platelets: 192 10*3/uL (ref 150–400)
RBC: 3.85 MIL/uL — ABNORMAL LOW (ref 3.87–5.11)
RDW: 21.3 % — ABNORMAL HIGH (ref 11.5–15.5)
WBC: 36 10*3/uL — ABNORMAL HIGH (ref 4.0–10.5)
nRBC: 0.5 % — ABNORMAL HIGH (ref 0.0–0.2)

## 2023-04-08 LAB — CULTURE, BLOOD (ROUTINE X 2)
Special Requests: ADEQUATE
Special Requests: ADEQUATE

## 2023-04-08 LAB — URINE CULTURE: Culture: <10000 — AB

## 2023-04-08 NOTE — Progress Notes (Signed)
Physical Therapy Treatment Patient Details Name: Chelsea Keller MRN: [id number] DOB: [birth year] Today's Date: 04/08/2023   History of Present Illness Pt is 88 y.o. female s/p left ORIF left femur fx sustained from a mechanical fall at home. PMH: chronic diastolic CHF, essential hypertension, chronic blood loss anemia requiring blood transfusion, morbid obesity    PT Comments  Pt is slowly progressing towards goals tolerated transfer from recliner to bed this session. Pt was able to tolerate staying on her L side then R side for peri care today. Pt was positioned for comfort in the bed. Pt was able to sit up in the recliner today for 2 hours. Due to pt current functional status, home set up and available assistance at home recommending skilled physical therapy services < 3 hours/day on discharge from acute care hospital setting in order to decrease risk for falls, immobility, pressure wounds, injury and re-hospitalization     If plan is discharge home, recommend the following: Two people to help with bathing/dressing/bathroom;Two people to help with walking and/or transfers;Assistance with cooking/housework;Assist for transportation   Can travel by private vehicle     No  Equipment Recommendations  Hoyer lift;Wheelchair cushion (measurements PT);Wheelchair (measurements PT);Hospital bed       Precautions / Restrictions Precautions Precautions: Fall Precaution Comments: pt with h/o >3 falls with fracture/major injury Restrictions Weight Bearing Restrictions: Yes LLE Weight Bearing: Touchdown weight bearing     Mobility  Bed Mobility Overal bed mobility: Needs Assistance Bed Mobility: Rolling Rolling: Total assist, Used rails, +2 for physical assistance   Supine to sit: Total assist, HOB elevated, Used rails Sit to supine: Total assist, +2 for physical assistance   General bed mobility comments: rolling performed to perform pericare and remove lift pad    Transfers Overall  transfer level: Needs assistance Equipment used: Ambulation equipment used Transfers: Bed to chair/wheelchair/BSC             General transfer comment: maximove utilized for transfer to bed with assistance with LLE to avoid pain Transfer via Lift Equipment: Maximove  Ambulation/Gait     General Gait Details: Unable at this time due to current functional level        Balance Overall balance assessment: Needs assistance, History of Falls Sitting-balance support: Single extremity supported Sitting balance-Leahy Scale: Zero Sitting balance - Comments: sitting up in recliner      Cognition Arousal: Alert Behavior During Therapy: WFL for tasks assessed/performed Overall Cognitive Status: Within Functional Limits for tasks assessed         General Comments General comments (skin integrity, edema, etc.): pt was able to tolerate lift. Pt has pain with rolling      Pertinent Vitals/Pain Pain Assessment Pain Assessment: Faces Faces Pain Scale: Hurts even more Pain Location: left leg Pain Descriptors / Indicators: Operative site guarding, Grimacing Pain Intervention(s): Limited activity within patient's tolerance, Monitored during session     PT Goals (current goals can now be found in the care plan section) Acute Rehab PT Goals Patient Stated Goal: to get back home PT Goal Formulation: With patient Time For Goal Achievement: 2023/05/07 Potential to Achieve Goals: Fair Progress towards PT goals: Progressing toward goals    Frequency    Min 1X/week      PT Plan  Continue with current POC    Co-evaluation   Reason for Co-Treatment: For patient/therapist safety;To address functional/ADL transfers   OT goals addressed during session: ADL's and self-care;Strengthening/ROM      AM-PAC PT "6 Clicks" Mobility  Outcome Measure  Help needed turning from your back to your side while in a flat bed without using bedrails?: Total Help needed moving from lying on  your back to sitting on the side of a flat bed without using bedrails?: Total Help needed moving to and from a bed to a chair (including a wheelchair)?: Total Help needed standing up from a chair using your arms (e.g., wheelchair or bedside chair)?: Total Help needed to walk in hospital room?: Total Help needed climbing 3-5 steps with a railing? : Total 6 Click Score: 6    End of Session   Activity Tolerance: Patient tolerated treatment well Patient left: in bed;with call bell/phone within reach;with bed alarm set;with nursing/sitter in room Nurse Communication: Mobility status PT Visit Diagnosis: Other abnormalities of gait and mobility (R26.89);Pain Pain - Right/Left: Left Pain - part of body: Leg     Time: 9528-4132 PT Time Calculation (min) (ACUTE ONLY): 23 min  Charges:    $Therapeutic Activity: 23-37 mins PT General Charges $$ ACUTE PT VISIT: 1 Visit                    Harrel Carina, DPT, CLT  Acute Rehabilitation Services Office: [phone] (Secure chat preferred)    Claudia Desanctis 04/08/2023, 1:06 PM

## 2023-04-08 NOTE — Progress Notes (Signed)
Occupational Therapy Treatment Patient Details Name: Chelsea Keller MRN: [id number] DOB: 08-Sep-1934 Today's Date: 04/08/2023   History of present illness Pt is 88 y.o. female s/p left ORIF left femur fx sustained from a mechanical fall at home. PMH: chronic diastolic CHF, essential hypertension, chronic blood loss anemia requiring blood transfusion, morbid obesity   OT comments  Patient received in supine and apprehensive on transfer to recliner but willing with encouragement. Patient performed rolling in bed for lift pad placement with assist of 2 due to RUE shoulder pain and LLE. Patient tolerated transfer and performed grooming tasks with min assist and setup provided. Patient will benefit from continued inpatient follow up therapy, <3 hours/day to address grooming, LB bathing and dressing and toilet transfers. Acute OT to continue to follow.       If plan is discharge home, recommend the following:  Two people to help with walking and/or transfers;Two people to help with bathing/dressing/bathroom;Assist for transportation;Help with stairs or ramp for entrance   Equipment Recommendations  Other (comment) (defer to next venue of care)    Recommendations for Other Services      Precautions / Restrictions Precautions Precautions: Fall Precaution Comments: pt with h/o >3 falls with fracture/major injury Restrictions Weight Bearing Restrictions: Yes LLE Weight Bearing: Touchdown weight bearing       Mobility Bed Mobility Overal bed mobility: Needs Assistance Bed Mobility: Rolling Rolling: Total assist, Used rails, +2 for physical assistance         General bed mobility comments: rolling performed to allow for lift pad placement    Transfers Overall transfer level: Needs assistance Equipment used: Ambulation equipment used Transfers: Bed to chair/wheelchair/BSC             General transfer comment: maximove utilized for transfer to recliner with assistance with LLE to  avoid pain Transfer via Lift Equipment: Maximove   Balance Overall balance assessment: Needs assistance, History of Falls Sitting-balance support: Single extremity supported Sitting balance-Leahy Scale: Zero Sitting balance - Comments: sitting up in recliner                                   ADL either performed or assessed with clinical judgement   ADL Overall ADL's : Needs assistance/impaired     Grooming: Wash/dry hands;Wash/dry face;Oral care;Minimal assistance;Sitting Grooming Details (indicate cue type and reason): in recliner         Upper Body Dressing : Minimal assistance;Sitting Upper Body Dressing Details (indicate cue type and reason): change gown                   General ADL Comments: unable to stand for ADLs at this time and maintain WB precautions    Extremity/Trunk Assessment              Vision       Perception     Praxis      Cognition Arousal: Alert Behavior During Therapy: [facility] for tasks assessed/performed Overall Cognitive Status: Within Functional Limits for tasks assessed                                          Exercises      Shoulder Instructions       General Comments Pt was able to tolerate lift well. States that she sits at home alot.  Pertinent Vitals/ Pain       Pain Assessment Pain Assessment: Faces Faces Pain Scale: Hurts even more Pain Location: left leg Pain Descriptors / Indicators: Operative site guarding, Grimacing Pain Intervention(s): Monitored during session, Repositioned, RN gave pain meds during session  Home Living                                          Prior Functioning/Environment              Frequency  Min 1X/week        Progress Toward Goals  OT Goals(current goals can now be found in the care plan section)  Progress towards OT goals: Progressing toward goals  Acute Rehab OT Goals Patient Stated Goal: get better OT Goal  Formulation: With patient Time For Goal Achievement: 04/07/2023 Potential to Achieve Goals: Good ADL Goals Pt Will Perform Grooming: with set-up;sitting Pt Will Perform Lower Body Bathing: with max assist;sitting/lateral leans;sit to/from stand;with adaptive equipment Pt Will Transfer to Toilet: with max assist;bedside commode;stand pivot transfer Pt/caregiver will Perform Home Exercise Program: Increased strength;Both right and left upper extremity;With theraband;Independently;With written HEP provided Additional ADL Goal #1: Pt will increase ability to transition from supine to sitting EOB with HOB elevated in order to progress OOB activity and participate in ADL tasks.  Plan      Co-evaluation    PT/OT/SLP Co-Evaluation/Treatment: Yes Reason for Co-Treatment: For patient/therapist safety;To address functional/ADL transfers PT goals addressed during session: Mobility/safety with mobility;Balance OT goals addressed during session: ADL's and self-care;Strengthening/ROM      AM-PAC OT "6 Clicks" Daily Activity     Outcome Measure   Help from another person eating meals?: A Little Help from another person taking care of personal grooming?: A Little Help from another person toileting, which includes using toliet, bedpan, or urinal?: Total Help from another person bathing (including washing, rinsing, drying)?: Total Help from another person to put on and taking off regular upper body clothing?: A Lot Help from another person to put on and taking off regular lower body clothing?: Total 6 Click Score: 11    End of Session Equipment Utilized During Treatment: Other (comment) (maximove lift)  OT Visit Diagnosis: Muscle weakness (generalized) (M62.81);Pain;History of falling (Z91.81) Pain - Right/Left: Left Pain - part of body: Leg   Activity Tolerance Patient tolerated treatment well   Patient Left in chair;with call bell/phone within reach;with chair alarm set;with nursing/sitter in  room   Nurse Communication Mobility status        Time: 9604-5409 OT Time Calculation (min): 33 min  Charges: OT General Charges $OT Visit: 1 Visit OT Treatments $Self Care/Home Management : 8-22 mins  Alfonse Flavors, OTA Acute Rehabilitation Services  Office [phone]   Dewain Penning 04/08/2023, 10:53 AM

## 2023-04-08 NOTE — Plan of Care (Signed)
  Problem: Pain Managment: Goal: General experience of comfort will improve Outcome: Progressing   Problem: Activity: Goal: Risk for activity intolerance will decrease Outcome: Not Progressing   Problem: Elimination: Goal: Will not experience complications related to urinary retention Outcome: Not Progressing

## 2023-04-08 NOTE — Plan of Care (Signed)
  Problem: Education: °Goal: Knowledge of General Education information will improve °Description: Including pain rating scale, medication(s)/side effects and non-pharmacologic comfort measures °Outcome: Progressing °  °Problem: Health Behavior/Discharge Planning: °Goal: Ability to manage health-related needs will improve °Outcome: Progressing °  °Problem: Clinical Measurements: °Goal: Ability to maintain clinical measurements within normal limits will improve °Outcome: Progressing °Goal: Will remain free from infection °Outcome: Progressing °Goal: Diagnostic test results will improve °Outcome: Progressing °Goal: Respiratory complications will improve °Outcome: Progressing °Goal: Cardiovascular complication will be avoided °Outcome: Progressing °  °Problem: Nutrition: °Goal: Adequate nutrition will be maintained °Outcome: Progressing °  °Problem: Safety: °Goal: Ability to remain free from injury will improve °Outcome: Progressing °  °Problem: Skin Integrity: °Goal: Risk for impaired skin integrity will decrease °Outcome: Progressing °  °

## 2023-04-08 NOTE — Progress Notes (Signed)
Physical Therapy Treatment Patient Details Name: Chelsea Keller MRN: [id number] DOB: 07-29-1934 Today's Date: 04/08/2023   History of Present Illness Pt is 88 y.o. female s/p left ORIF left femur fx sustained from a mechanical fall at home. PMH: chronic diastolic CHF, essential hypertension, chronic blood loss anemia requiring blood transfusion, morbid obesity    PT Comments  Pt hesitant to transfer from bed to chair initially; agreeable following education on the benefits of sitting upright for the cardiovascular and respiratory system. Pt is slowly progressing towards goals tolerating sitting up in recliner this session which is improvement from initial evaluation. Pt reports that she screams in pain but tolerated transfer well without yelling out. Pt was positioned for comfort in the chair. Due to pt current functional status, home set up and available assistance at home recommending skilled physical therapy services < 3 hours/day on discharge from acute care hospital setting in order to decrease risk for falls, immobility, pressure wounds, injury and re-hospitalization.     If plan is discharge home, recommend the following: Two people to help with bathing/dressing/bathroom;Two people to help with walking and/or transfers;Assistance with cooking/housework;Assist for transportation   Can travel by private vehicle     No  Equipment Recommendations  Hoyer lift;Wheelchair cushion (measurements PT);Wheelchair (measurements PT);Hospital bed       Precautions / Restrictions Precautions Precautions: Fall Precaution Comments: pt with h/o >3 falls with fracture/major injury Restrictions Weight Bearing Restrictions: Yes LLE Weight Bearing: Touchdown weight bearing     Mobility  Bed Mobility Overal bed mobility: Needs Assistance Bed Mobility: Rolling Rolling: Total assist, Used rails, +2 for physical assistance         General bed mobility comments: Pt was able to follow cueing and  initiate roll, requires Max A +2 for total A for rolling both R and L with more assist required to the L due to operative leg and R shoulder injury.    Transfers Overall transfer level: Needs assistance Equipment used: Ambulation equipment used Transfers: Bed to chair/wheelchair/BSC             General transfer comment: Pt was moved from bed to recliner with lift, pt tolerated well. LLE was assisted during lift to mitigate pain Transfer via Lift Equipment: Maximove  Ambulation/Gait               General Gait Details: Unable at this time due to current functional level        Balance Overall balance assessment: Needs assistance, History of Falls Sitting-balance support: Single extremity supported Sitting balance-Leahy Scale: Zero Sitting balance - Comments: Pt sitting in recliner with full back support no noted leaning          Cognition Arousal: Alert Behavior During Therapy: WFL for tasks assessed/performed Overall Cognitive Status: Within Functional Limits for tasks assessed             General Comments General comments (skin integrity, edema, etc.): Pt was able to tolerate lift well. States that she sits at home alot.      Pertinent Vitals/Pain Pain Assessment Pain Assessment: Faces Faces Pain Scale: Hurts even more Pain Location: left leg Pain Descriptors / Indicators: Operative site guarding, Grimacing Pain Intervention(s): Monitored during session, Limited activity within patient's tolerance, RN gave pain meds during session     PT Goals (current goals can now be found in the care plan section) Acute Rehab PT Goals Patient Stated Goal: to get back home PT Goal Formulation: With patient Time For Goal Achievement: 04/21/2023  Potential to Achieve Goals: Fair Progress towards PT goals: Progressing toward goals    Frequency    Min 1X/week      PT Plan  Continue with current POC    Co-evaluation   Reason for Co-Treatment: For  patient/therapist safety;To address functional/ADL transfers PT goals addressed during session: Mobility/safety with mobility;Balance        AM-PAC PT "6 Clicks" Mobility   Outcome Measure  Help needed turning from your back to your side while in a flat bed without using bedrails?: Total Help needed moving from lying on your back to sitting on the side of a flat bed without using bedrails?: Total Help needed moving to and from a bed to a chair (including a wheelchair)?: Total Help needed standing up from a chair using your arms (e.g., wheelchair or bedside chair)?: Total Help needed to walk in hospital room?: Total Help needed climbing 3-5 steps with a railing? : Total 6 Click Score: 6    End of Session   Activity Tolerance: Patient tolerated treatment well Patient left: in chair;with call bell/phone within reach;with chair alarm set Nurse Communication: Mobility status PT Visit Diagnosis: Other abnormalities of gait and mobility (R26.89);Pain Pain - Right/Left: Left Pain - part of body: Leg     Time: 0820-0854 PT Time Calculation (min) (ACUTE ONLY): 34 min  Charges:    $Therapeutic Activity: 8-22 mins PT General Charges $$ ACUTE PT VISIT: 1 Visit                     Harrel Carina, DPT, CLT  Acute Rehabilitation Services Office: [phone] (Secure chat preferred)    Claudia Desanctis 04/08/2023, 9:01 AM

## 2023-04-08 NOTE — Progress Notes (Signed)
PROGRESS NOTE  Chelsea Keller  JXB:[id number] DOB: 1935-02-26 DOA: 04/03/2023 PCP: [name], MD   Brief Narrative: Patient is a 88 year old female with history of chronic diastolic CHF, hypertension, chronic blood loss anemia requiring blood transfusion, morbid obesity who presented to the emergency department with complaint of fall at home when she lost her balance while coming out of the bathroom.  She was also having 4 to 5 weeks onset of abdominal pain.  Left knee x-ray showed acute comminuted fracture of distal femoral metaphysis at the level of femoral complaint of knee replacement.  Orthopedics consulted, status post ORIF.  CT imaging showed pericolonic inflammation along the descending colon suggesting diverticulitis.  Started antibiotics.  PT/OT recommending SNF.  Hospital course remarkable for persistent leukocytosis.  Follow-up CT imaging on 10/12 showed persistent colitis.  Assessment & Plan:  Principal Problem:   Closed fracture of distal end of femur, unspecified fracture morphology, initial encounter (HCC) Active Problems:   Essential hypertension   Chronic diastolic CHF (congestive heart failure) (HCC)   Acute diarrhea   Anemia of chronic disease   Acute diverticulitis   Leukocytosis   Chronic kidney disease, stage 3b (HCC)   Recurrent deep vein thrombosis (DVT) (HCC)  Acute commuted fracture of distal femoral metaphysis on the left: Orthopedics consulted, status post ORIF.  PT/OT recommending SNF on discharge.  Continue pain management, supportive care, bowel regimen.  Orthopedics recommending follow-up as an outpatient after 2 weeks for wound check and repeat x-rays.  Diverticulitis: She was having 4 to 5 weeks history of lower abdominal pain.  CT imaging done on admission showed  pericolonic inflammation along the descending colon suggesting diverticulitis.  Her leukocytosis has persisted.   Antibiotics changed to Zosyn.  Blood cultures have remained negative so far.   She does not complain of any abdomen pain or dysuria today.  Abdomen is soft, nondistended, nontender with good bowel sounds. But CT abdomen/pelvis done on 10/2 showed progressive colitis with bowel edema.  Currently on full liquid diet.  Will slowly advance.  Suspected UTI: Patient was confused, has leukocytosis, UA done on 10/2 shows some leukocytes.  Follow-up urine culture.  Patient already on antibiotics  Chronic normocytic anemia: Follows with hematology, Dr. Pamelia Hoit  had blood transfusions in the past.  Monitor hemoglobin  AKI in CKD stage IIIb: Currently kidney function at baseline.  Avoid nephrotoxins  History of recurrent DVTs: On Eliquis.  Eliquis  restarted   Hypertension: Triamterene-hydrochlorothiazide discontinued.  Monitor blood pressure, will start on amlodipine low-dose.  Chronic diastolic CHF: Home torsemide on hold due to AKI.  She says this does not take torsemide  anymore at home.  Currently appears euvolemic.  She might need low-dose Lasix on discharge  Hyperkalemia: Resolved  Acute diarrhea: Improved  Vitamin D deficiency: Started supplemented       DVT prophylaxis:apixaban (ELIQUIS) tablet 2.5 mg Start: 04/07/23 1000 SCDs Start: 04/06/23 0816 SCDs Start: 04/03/23 2019 apixaban (ELIQUIS) tablet 2.5 mg     Code Status: Full Code  Family Communication: Called and discussed with daughter on phone on 10/3  Patient status:Inpatient  Patient is from :Home  Anticipated discharge to:SNF  Estimated DC date: After improvement in the leukocytosis   Consultants: Orthopedics  Procedures: ORIF  Antimicrobials:  Anti-infectives (From admission, onward)    Start     Dose/Rate Route Frequency Ordered Stop   04/07/23 1045  piperacillin-tazobactam (ZOSYN) IVPB 3.375 g        3.375 g 12.5 mL/hr over 240 Minutes Intravenous  PROGRESS NOTE  Chelsea Keller  JXB:[id number] DOB: 1935-02-26 DOA: 04/03/2023 PCP: [name], MD   Brief Narrative: Patient is a 88 year old female with history of chronic diastolic CHF, hypertension, chronic blood loss anemia requiring blood transfusion, morbid obesity who presented to the emergency department with complaint of fall at home when she lost her balance while coming out of the bathroom.  She was also having 4 to 5 weeks onset of abdominal pain.  Left knee x-ray showed acute comminuted fracture of distal femoral metaphysis at the level of femoral complaint of knee replacement.  Orthopedics consulted, status post ORIF.  CT imaging showed pericolonic inflammation along the descending colon suggesting diverticulitis.  Started antibiotics.  PT/OT recommending SNF.  Hospital course remarkable for persistent leukocytosis.  Follow-up CT imaging on 10/12 showed persistent colitis.  Assessment & Plan:  Principal Problem:   Closed fracture of distal end of femur, unspecified fracture morphology, initial encounter (HCC) Active Problems:   Essential hypertension   Chronic diastolic CHF (congestive heart failure) (HCC)   Acute diarrhea   Anemia of chronic disease   Acute diverticulitis   Leukocytosis   Chronic kidney disease, stage 3b (HCC)   Recurrent deep vein thrombosis (DVT) (HCC)  Acute commuted fracture of distal femoral metaphysis on the left: Orthopedics consulted, status post ORIF.  PT/OT recommending SNF on discharge.  Continue pain management, supportive care, bowel regimen.  Orthopedics recommending follow-up as an outpatient after 2 weeks for wound check and repeat x-rays.  Diverticulitis: She was having 4 to 5 weeks history of lower abdominal pain.  CT imaging done on admission showed  pericolonic inflammation along the descending colon suggesting diverticulitis.  Her leukocytosis has persisted.   Antibiotics changed to Zosyn.  Blood cultures have remained negative so far.   She does not complain of any abdomen pain or dysuria today.  Abdomen is soft, nondistended, nontender with good bowel sounds. But CT abdomen/pelvis done on 10/2 showed progressive colitis with bowel edema.  Currently on full liquid diet.  Will slowly advance.  Suspected UTI: Patient was confused, has leukocytosis, UA done on 10/2 shows some leukocytes.  Follow-up urine culture.  Patient already on antibiotics  Chronic normocytic anemia: Follows with hematology, Dr. Pamelia Hoit  had blood transfusions in the past.  Monitor hemoglobin  AKI in CKD stage IIIb: Currently kidney function at baseline.  Avoid nephrotoxins  History of recurrent DVTs: On Eliquis.  Eliquis  restarted   Hypertension: Triamterene-hydrochlorothiazide discontinued.  Monitor blood pressure, will start on amlodipine low-dose.  Chronic diastolic CHF: Home torsemide on hold due to AKI.  She says this does not take torsemide  anymore at home.  Currently appears euvolemic.  She might need low-dose Lasix on discharge  Hyperkalemia: Resolved  Acute diarrhea: Improved  Vitamin D deficiency: Started supplemented       DVT prophylaxis:apixaban (ELIQUIS) tablet 2.5 mg Start: 04/07/23 1000 SCDs Start: 04/06/23 0816 SCDs Start: 04/03/23 2019 apixaban (ELIQUIS) tablet 2.5 mg     Code Status: Full Code  Family Communication: Called and discussed with daughter on phone on 10/3  Patient status:Inpatient  Patient is from :Home  Anticipated discharge to:SNF  Estimated DC date: After improvement in the leukocytosis   Consultants: Orthopedics  Procedures: ORIF  Antimicrobials:  Anti-infectives (From admission, onward)    Start     Dose/Rate Route Frequency Ordered Stop   04/07/23 1045  piperacillin-tazobactam (ZOSYN) IVPB 3.375 g        3.375 g 12.5 mL/hr over 240 Minutes Intravenous  0518 04/08/23 0519  NA 134*  --  132* 131*  --  133* 134*  K 5.3*   < > 4.6 5.3* 4.9 4.4 4.2  CL 102  --  99 99  --  102 100  CO2 24  --  24 22  --  18* 24  GLUCOSE 117*  --  107* 131*  --  101* 110*  BUN 26*  --  34* 43*  --  54* 62*  CREATININE 1.54*  --  1.49* 1.68*  --  1.41* 1.39*  CALCIUM 8.2*  --  8.1* 8.3*  --  8.1* 8.0*  MG 1.4*  --  2.0  --   --  1.8  --   PHOS 5.3*  --  4.0  --   --  5.3*  --    < > = values in this interval not displayed.     Recent Results (from the past 240 hour(s))  Blood culture (routine x 2)     Status: None (Preliminary result)   Collection Time: 04/03/23  6:22 PM   Specimen: BLOOD  Result Value Ref Range Status   Specimen Description   Final    BLOOD LEFT ANTECUBITAL Performed at [facility], [address], [place], [place] [address]    Special Requests   Final    BOTTLES DRAWN AEROBIC AND ANAEROBIC Blood Culture adequate volume Performed at [facility], [address] [place], [place] [address]    Culture   Final    NO GROWTH 4 DAYS Performed at [facility], [address], [place], [place] [address]    Report Status PENDING  Incomplete  Blood culture (routine x 2)     Status: None (Preliminary result)   Collection Time: 04/03/23  6:37 PM   Specimen: BLOOD  Result Value Ref Range Status   Specimen Description   Final    BLOOD RIGHT ANTECUBITAL Performed at [facility], [address], [place], [place] [address]    Special Requests   Final    BOTTLES DRAWN AEROBIC AND ANAEROBIC Blood Culture adequate volume Performed at [facility], [address] [place], [place] [address]    Culture   Final    NO GROWTH 4 DAYS Performed at [facility], [address], [place], [place] [address]    Report Status PENDING  Incomplete  Surgical pcr screen     Status: None   Collection Time: 04/05/23  8:06 AM   Specimen: Nasal Mucosa; Nasal Swab  Result Value Ref Range Status   MRSA, PCR NEGATIVE NEGATIVE Final   Staphylococcus aureus NEGATIVE NEGATIVE Final    Comment: (NOTE) The Xpert SA Assay (FDA approved for NASAL specimens in patients 79 years of age and older), is one component of a comprehensive surveillance program. It is not intended to diagnose infection nor to guide or monitor treatment. Performed at [facility], [address] [place], [place] [address]      Radiology Studies: VAS [place] LOWER EXTREMITY VENOUS (DVT)  Result Date: 04/07/2023  Lower Venous DVT Study Patient Name:  ALEXINA NICCOLI  Date of Exam:   04/07/2023 Medical Rec #: [id number]      Accession #:    [id number] Date of Birth: 05-30-1935      Patient Gender: F Patient Age:   55 years Exam Location:  [facility] Procedure:      VAS [place] LOWER EXTREMITY VENOUS (  0518 04/08/23 0519  NA 134*  --  132* 131*  --  133* 134*  K 5.3*   < > 4.6 5.3* 4.9 4.4 4.2  CL 102  --  99 99  --  102 100  CO2 24  --  24 22  --  18* 24  GLUCOSE 117*  --  107* 131*  --  101* 110*  BUN 26*  --  34* 43*  --  54* 62*  CREATININE 1.54*  --  1.49* 1.68*  --  1.41* 1.39*  CALCIUM 8.2*  --  8.1* 8.3*  --  8.1* 8.0*  MG 1.4*  --  2.0  --   --  1.8  --   PHOS 5.3*  --  4.0  --   --  5.3*  --    < > = values in this interval not displayed.     Recent Results (from the past 240 hour(s))  Blood culture (routine x 2)     Status: None (Preliminary result)   Collection Time: 04/03/23  6:22 PM   Specimen: BLOOD  Result Value Ref Range Status   Specimen Description   Final    BLOOD LEFT ANTECUBITAL Performed at [facility], [address], [place], [place] [address]    Special Requests   Final    BOTTLES DRAWN AEROBIC AND ANAEROBIC Blood Culture adequate volume Performed at [facility], [address] [place], [place] [address]    Culture   Final    NO GROWTH 4 DAYS Performed at [facility], [address], [place], [place] [address]    Report Status PENDING  Incomplete  Blood culture (routine x 2)     Status: None (Preliminary result)   Collection Time: 04/03/23  6:37 PM   Specimen: BLOOD  Result Value Ref Range Status   Specimen Description   Final    BLOOD RIGHT ANTECUBITAL Performed at [facility], [address], [place], [place] [address]    Special Requests   Final    BOTTLES DRAWN AEROBIC AND ANAEROBIC Blood Culture adequate volume Performed at [facility], [address] [place], [place] [address]    Culture   Final    NO GROWTH 4 DAYS Performed at [facility], [address], [place], [place] [address]    Report Status PENDING  Incomplete  Surgical pcr screen     Status: None   Collection Time: 04/05/23  8:06 AM   Specimen: Nasal Mucosa; Nasal Swab  Result Value Ref Range Status   MRSA, PCR NEGATIVE NEGATIVE Final   Staphylococcus aureus NEGATIVE NEGATIVE Final    Comment: (NOTE) The Xpert SA Assay (FDA approved for NASAL specimens in patients 79 years of age and older), is one component of a comprehensive surveillance program. It is not intended to diagnose infection nor to guide or monitor treatment. Performed at [facility], [address] [place], [place] [address]      Radiology Studies: VAS [place] LOWER EXTREMITY VENOUS (DVT)  Result Date: 04/07/2023  Lower Venous DVT Study Patient Name:  ALEXINA NICCOLI  Date of Exam:   04/07/2023 Medical Rec #: [id number]      Accession #:    [id number] Date of Birth: 05-30-1935      Patient Gender: F Patient Age:   55 years Exam Location:  [facility] Procedure:      VAS [place] LOWER EXTREMITY VENOUS (  PROGRESS NOTE  Chelsea Keller  JXB:[id number] DOB: 1935-02-26 DOA: 04/03/2023 PCP: [name], MD   Brief Narrative: Patient is a 88 year old female with history of chronic diastolic CHF, hypertension, chronic blood loss anemia requiring blood transfusion, morbid obesity who presented to the emergency department with complaint of fall at home when she lost her balance while coming out of the bathroom.  She was also having 4 to 5 weeks onset of abdominal pain.  Left knee x-ray showed acute comminuted fracture of distal femoral metaphysis at the level of femoral complaint of knee replacement.  Orthopedics consulted, status post ORIF.  CT imaging showed pericolonic inflammation along the descending colon suggesting diverticulitis.  Started antibiotics.  PT/OT recommending SNF.  Hospital course remarkable for persistent leukocytosis.  Follow-up CT imaging on 10/12 showed persistent colitis.  Assessment & Plan:  Principal Problem:   Closed fracture of distal end of femur, unspecified fracture morphology, initial encounter (HCC) Active Problems:   Essential hypertension   Chronic diastolic CHF (congestive heart failure) (HCC)   Acute diarrhea   Anemia of chronic disease   Acute diverticulitis   Leukocytosis   Chronic kidney disease, stage 3b (HCC)   Recurrent deep vein thrombosis (DVT) (HCC)  Acute commuted fracture of distal femoral metaphysis on the left: Orthopedics consulted, status post ORIF.  PT/OT recommending SNF on discharge.  Continue pain management, supportive care, bowel regimen.  Orthopedics recommending follow-up as an outpatient after 2 weeks for wound check and repeat x-rays.  Diverticulitis: She was having 4 to 5 weeks history of lower abdominal pain.  CT imaging done on admission showed  pericolonic inflammation along the descending colon suggesting diverticulitis.  Her leukocytosis has persisted.   Antibiotics changed to Zosyn.  Blood cultures have remained negative so far.   She does not complain of any abdomen pain or dysuria today.  Abdomen is soft, nondistended, nontender with good bowel sounds. But CT abdomen/pelvis done on 10/2 showed progressive colitis with bowel edema.  Currently on full liquid diet.  Will slowly advance.  Suspected UTI: Patient was confused, has leukocytosis, UA done on 10/2 shows some leukocytes.  Follow-up urine culture.  Patient already on antibiotics  Chronic normocytic anemia: Follows with hematology, Dr. Pamelia Hoit  had blood transfusions in the past.  Monitor hemoglobin  AKI in CKD stage IIIb: Currently kidney function at baseline.  Avoid nephrotoxins  History of recurrent DVTs: On Eliquis.  Eliquis  restarted   Hypertension: Triamterene-hydrochlorothiazide discontinued.  Monitor blood pressure, will start on amlodipine low-dose.  Chronic diastolic CHF: Home torsemide on hold due to AKI.  She says this does not take torsemide  anymore at home.  Currently appears euvolemic.  She might need low-dose Lasix on discharge  Hyperkalemia: Resolved  Acute diarrhea: Improved  Vitamin D deficiency: Started supplemented       DVT prophylaxis:apixaban (ELIQUIS) tablet 2.5 mg Start: 04/07/23 1000 SCDs Start: 04/06/23 0816 SCDs Start: 04/03/23 2019 apixaban (ELIQUIS) tablet 2.5 mg     Code Status: Full Code  Family Communication: Called and discussed with daughter on phone on 10/3  Patient status:Inpatient  Patient is from :Home  Anticipated discharge to:SNF  Estimated DC date: After improvement in the leukocytosis   Consultants: Orthopedics  Procedures: ORIF  Antimicrobials:  Anti-infectives (From admission, onward)    Start     Dose/Rate Route Frequency Ordered Stop   04/07/23 1045  piperacillin-tazobactam (ZOSYN) IVPB 3.375 g        3.375 g 12.5 mL/hr over 240 Minutes Intravenous  0518 04/08/23 0519  NA 134*  --  132* 131*  --  133* 134*  K 5.3*   < > 4.6 5.3* 4.9 4.4 4.2  CL 102  --  99 99  --  102 100  CO2 24  --  24 22  --  18* 24  GLUCOSE 117*  --  107* 131*  --  101* 110*  BUN 26*  --  34* 43*  --  54* 62*  CREATININE 1.54*  --  1.49* 1.68*  --  1.41* 1.39*  CALCIUM 8.2*  --  8.1* 8.3*  --  8.1* 8.0*  MG 1.4*  --  2.0  --   --  1.8  --   PHOS 5.3*  --  4.0  --   --  5.3*  --    < > = values in this interval not displayed.     Recent Results (from the past 240 hour(s))  Blood culture (routine x 2)     Status: None (Preliminary result)   Collection Time: 04/03/23  6:22 PM   Specimen: BLOOD  Result Value Ref Range Status   Specimen Description   Final    BLOOD LEFT ANTECUBITAL Performed at [facility], [address], [place], [place] [address]    Special Requests   Final    BOTTLES DRAWN AEROBIC AND ANAEROBIC Blood Culture adequate volume Performed at [facility], [address] [place], [place] [address]    Culture   Final    NO GROWTH 4 DAYS Performed at [facility], [address], [place], [place] [address]    Report Status PENDING  Incomplete  Blood culture (routine x 2)     Status: None (Preliminary result)   Collection Time: 04/03/23  6:37 PM   Specimen: BLOOD  Result Value Ref Range Status   Specimen Description   Final    BLOOD RIGHT ANTECUBITAL Performed at [facility], [address], [place], [place] [address]    Special Requests   Final    BOTTLES DRAWN AEROBIC AND ANAEROBIC Blood Culture adequate volume Performed at [facility], [address] [place], [place] [address]    Culture   Final    NO GROWTH 4 DAYS Performed at [facility], [address], [place], [place] [address]    Report Status PENDING  Incomplete  Surgical pcr screen     Status: None   Collection Time: 04/05/23  8:06 AM   Specimen: Nasal Mucosa; Nasal Swab  Result Value Ref Range Status   MRSA, PCR NEGATIVE NEGATIVE Final   Staphylococcus aureus NEGATIVE NEGATIVE Final    Comment: (NOTE) The Xpert SA Assay (FDA approved for NASAL specimens in patients 79 years of age and older), is one component of a comprehensive surveillance program. It is not intended to diagnose infection nor to guide or monitor treatment. Performed at [facility], [address] [place], [place] [address]      Radiology Studies: VAS [place] LOWER EXTREMITY VENOUS (DVT)  Result Date: 04/07/2023  Lower Venous DVT Study Patient Name:  ALEXINA NICCOLI  Date of Exam:   04/07/2023 Medical Rec #: [id number]      Accession #:    [id number] Date of Birth: 05-30-1935      Patient Gender: F Patient Age:   55 years Exam Location:  [facility] Procedure:      VAS [place] LOWER EXTREMITY VENOUS (

## 2023-04-09 ENCOUNTER — Inpatient Hospital Stay (HOSPITAL_COMMUNITY): Payer: Medicare Other

## 2023-04-09 ENCOUNTER — Other Ambulatory Visit (HOSPITAL_COMMUNITY): Payer: Self-pay

## 2023-04-09 DIAGNOSIS — A09 Infectious gastroenteritis and colitis, unspecified: Secondary | ICD-10-CM | POA: Diagnosis not present

## 2023-04-09 DIAGNOSIS — S72409A Unspecified fracture of lower end of unspecified femur, initial encounter for closed fracture: Secondary | ICD-10-CM | POA: Diagnosis not present

## 2023-04-09 DIAGNOSIS — A0472 Enterocolitis due to Clostridium difficile, not specified as recurrent: Secondary | ICD-10-CM

## 2023-04-09 LAB — BASIC METABOLIC PANEL
Anion gap: 16 — ABNORMAL HIGH (ref 5–15)
BUN: 71 mg/dL — ABNORMAL HIGH (ref 8–23)
CO2: 23 mmol/L (ref 22–32)
Calcium: 8.1 mg/dL — ABNORMAL LOW (ref 8.9–10.3)
Chloride: 99 mmol/L (ref 98–111)
Creatinine, Ser: 1.59 mg/dL — ABNORMAL HIGH (ref 0.44–1.00)
GFR, Estimated: 31 mL/min — ABNORMAL LOW (ref >60)
Glucose, Bld: 101 mg/dL — ABNORMAL HIGH (ref 70–99)
Potassium: 3.6 mmol/L (ref 3.5–5.1)
Sodium: 138 mmol/L (ref 135–145)

## 2023-04-09 LAB — CBC
HCT: 35 % — ABNORMAL LOW (ref 36.0–46.0)
Hemoglobin: 10.8 g/dL — ABNORMAL LOW (ref 12.0–15.0)
MCH: 27.1 pg (ref 26.0–34.0)
MCHC: 30.9 g/dL (ref 30.0–36.0)
MCV: 87.9 fL (ref 80.0–100.0)
Platelets: 159 10*3/uL (ref 150–400)
RBC: 3.98 MIL/uL (ref 3.87–5.11)
RDW: 21.4 % — ABNORMAL HIGH (ref 11.5–15.5)
WBC: 43.5 10*3/uL — ABNORMAL HIGH (ref 4.0–10.5)
nRBC: 0.4 % — ABNORMAL HIGH (ref 0.0–0.2)

## 2023-04-09 MED ORDER — FIDAXOMICIN 200 MG PO TABS
200.0000 mg | ORAL_TABLET | Freq: Two times a day (BID) | ORAL | Status: DC
  Administered 2023-04-09 – 2023-04-15 (×13): 200 mg via ORAL
  Filled 2023-04-09 (×15): qty 1

## 2023-04-09 MED ORDER — LOPERAMIDE HCL 2 MG PO CAPS: 2.0000 mg | ORAL_CAPSULE | Freq: Four times a day (QID) | ORAL | Status: DC | PRN

## 2023-04-09 MED ORDER — GERHARDT'S BUTT CREAM
TOPICAL_CREAM | Freq: Three times a day (TID) | CUTANEOUS | Status: DC
  Filled 2023-04-09: qty 1

## 2023-04-09 NOTE — Plan of Care (Signed)
  Problem: Activity: Goal: Risk for activity intolerance will decrease Outcome: Progressing   Problem: Elimination: Goal: Will not experience complications related to bowel motility Outcome: Progressing   Problem: Pain Managment: Goal: General experience of comfort will improve Outcome: Progressing   

## 2023-04-09 NOTE — Progress Notes (Signed)
PROGRESS NOTE  Chelsea Keller  WUJ:[id number] DOB: [birth year] DOA: 04/03/2023 PCP: [name], MD   Brief Narrative: Patient is a 88 year old female with history of chronic diastolic CHF, hypertension, chronic blood loss anemia requiring blood transfusion, morbid obesity who presented to the emergency department with complaint of fall at home when she lost her balance while coming out of the bathroom.  She was also having 4 to 5 weeks onset of abdominal pain.  Left knee x-ray showed acute comminuted fracture of distal femoral metaphysis at the level of femoral complaint of knee replacement.  Orthopedics consulted, status post ORIF.  CT imaging showed pericolonic inflammation along the descending colon suggesting diverticulitis.  Started antibiotics.  PT/OT recommending SNF.  Hospital course remarkable for persistent leukocytosis.  Follow-up CT imaging on 10/12 showed persistent colitis.  Checking C. difficile.  GI also consulted.  Assessment & Plan:  Principal Problem:   Closed fracture of distal end of femur, unspecified fracture morphology, initial encounter (HCC) Active Problems:   Essential hypertension   Chronic diastolic CHF (congestive heart failure) (HCC)   Acute diarrhea   Anemia of chronic disease   Acute diverticulitis   Leukocytosis   Chronic kidney disease, stage 3b (HCC)   Recurrent deep vein thrombosis (DVT) (HCC)  Acute commuted fracture of distal femoral metaphysis on the left: Orthopedics consulted, status post ORIF.  PT/OT recommending SNF on discharge.  Continue pain management, supportive care, bowel regimen.  Orthopedics recommending follow-up as an outpatient after 2 weeks for wound check and repeat x-rays. Patient has bruise on the right great toe.  X-ray of the right great toe showed fracture of the base of distal phalanx.  Osteomyelitis is not excluded but patient does not have any ulcer.  At this point, osteomyelitis is less likely.  If there is drainage or  formation of ulcer in the future, can do MRI as an outpatient  Diverticulitis/colitis/severe leukocytosis: She was having 4 to 5 weeks history of lower abdominal pain.  CT imaging done on admission showed  pericolonic inflammation along the descending colon suggesting diverticulitis.  Her leukocytosis has severely worsened.   Antibiotics changed to Zosyn.  Blood cultures have remained negative so far.  She does not complain of any abdomen pain or dysuria today.  Abdomen is soft, nondistended, mostly non-tender with good bowel sounds.  But she started having multiple loose bowel movements today. CT abdomen/pelvis done on 10/2 showed progressive colitis with bowel edema.  Currently on full liquid diet.  Will check C. difficile.  We have also requested GI consultation.  Suspected UTI: Patient was confused, has leukocytosis, UA done on 10/2 shows some leukocytes.  Follow-up urine culture showed insignificant growth.  Patient not have dysuria  Chronic normocytic anemia: Follows with hematology, Dr. Pamelia Hoit  had blood transfusions in the past.  Monitor hemoglobin, currently stable  AKI in CKD stage IIIb: Currently kidney function at baseline.  Avoid nephrotoxins  History of recurrent DVTs: On Eliquis.  Eliquis  restarted   Hypertension: Triamterene-hydrochlorothiazide discontinued.  Monitor blood pressure, started on low-dose amlodipine  Chronic diastolic CHF: Home torsemide on hold due to AKI.  She says this does not take torsemide  anymore at home.  Currently appears euvolemic.  She may need  low-dose Lasix on discharge  Hyperkalemia: Resolved  Vitamin D deficiency: Started supplemented       DVT prophylaxis:apixaban (ELIQUIS) tablet 2.5 mg Start: 04/07/23 1000 SCDs Start: 04/06/23 0816 SCDs Start: 04/03/23 2019 apixaban (ELIQUIS) tablet 2.5 mg  NASAL specimens in patients 90 years of age and older), is one component of a comprehensive surveillance program. It is not intended to diagnose infection nor to guide or monitor treatment. Performed at [facility], [address] [place], [place] [address]   Urine Culture (for pregnant, neutropenic or urologic patients or  patients with an indwelling urinary catheter)     Status: Abnormal   Collection Time: 04/07/23  9:58 AM   Specimen: Urine, Clean Catch  Result Value Ref Range Status   Specimen Description URINE, CLEAN CATCH  Final   Special Requests NONE  Final   Culture (A)  Final    <10,000 COLONIES/mL INSIGNIFICANT GROWTH Performed at [facility], [address] [place], [place] [address]    Report Status 04/08/2023 FINAL  Final     Radiology Studies: DG Toe Great Right  Result Date: 04/09/2023 CLINICAL DATA:  Right great toe pain. EXAM: RIGHT GREAT TOE COMPARISON:  Right foot radiographs-03/26/2023 FINDINGS: Osteopenia. There is a potential minimally displaced obliquely oriented fracture involving the base of the distal phalanx of the great toe with associated geographic osteopenia involving the lateral aspect of the great toe. This finding is associated with adjacent soft tissue swelling. No subcutaneous emphysema. No radiopaque foreign body. Mild degenerative change of the first MTP joint with joint space loss, subchondral sclerosis and osteophytosis. No significant hallux valgus deformity. Adjacent joint spaces appear preserved given obliquity and field of view. IMPRESSION: Potential minimally displaced obliquely oriented fracture involving the base of the distal phalanx of the great toe with associated geographic osteopenia involving the lateral aspect of the great toe. While this is presumably the sequela of subacute injury, underlying infection is not excluded on the basis of this examination. Clinical correlation is advised. Further evaluation with MRI could be performed as indicated Electronically Signed   By: Simonne Come M.D.   On: 04/09/2023 10:13   VAS [place] LOWER EXTREMITY VENOUS (DVT)  Result Date: 04/07/2023  Lower Venous DVT Study Patient Name:  Chelsea Keller  Date of Exam:   04/07/2023 Medical Rec #: [id number]      Accession #:    [id number] Date of Birth: 09-13-1934      Patient Gender: F  Patient Age:   46 years Exam Location:  [facility] Procedure:      VAS [place] LOWER EXTREMITY VENOUS (DVT) Referring Phys: PARDEEP KHATRI --------------------------------------------------------------------------------  Indications: Pain.  Risk Factors: None identified. Limitations: Poor ultrasound/tissue interface and body habitus. Comparison Study: No prior studies. Performing Technologist: Chanda Busing RVT  Examination Guidelines: A complete evaluation includes B-mode imaging, spectral Doppler, color Doppler, and power Doppler as needed of all accessible portions of each vessel. Bilateral testing is considered an integral part of a complete examination. Limited examinations for reoccurring indications may be performed as noted. The reflux portion of the exam is performed with the patient in reverse Trendelenburg.  +---------+---------------+---------+-----------+----------+-------------------+ RIGHT    CompressibilityPhasicitySpontaneityPropertiesThrombus Aging      +---------+---------------+---------+-----------+----------+-------------------+ CFV      Full           Yes      Yes                                      +---------+---------------+---------+-----------+----------+-------------------+ SFJ      Full                                                             +---------+---------------+---------+-----------+----------+-------------------+  PROGRESS NOTE  Chelsea Keller  WUJ:[id number] DOB: [birth year] DOA: 04/03/2023 PCP: [name], MD   Brief Narrative: Patient is a 88 year old female with history of chronic diastolic CHF, hypertension, chronic blood loss anemia requiring blood transfusion, morbid obesity who presented to the emergency department with complaint of fall at home when she lost her balance while coming out of the bathroom.  She was also having 4 to 5 weeks onset of abdominal pain.  Left knee x-ray showed acute comminuted fracture of distal femoral metaphysis at the level of femoral complaint of knee replacement.  Orthopedics consulted, status post ORIF.  CT imaging showed pericolonic inflammation along the descending colon suggesting diverticulitis.  Started antibiotics.  PT/OT recommending SNF.  Hospital course remarkable for persistent leukocytosis.  Follow-up CT imaging on 10/12 showed persistent colitis.  Checking C. difficile.  GI also consulted.  Assessment & Plan:  Principal Problem:   Closed fracture of distal end of femur, unspecified fracture morphology, initial encounter (HCC) Active Problems:   Essential hypertension   Chronic diastolic CHF (congestive heart failure) (HCC)   Acute diarrhea   Anemia of chronic disease   Acute diverticulitis   Leukocytosis   Chronic kidney disease, stage 3b (HCC)   Recurrent deep vein thrombosis (DVT) (HCC)  Acute commuted fracture of distal femoral metaphysis on the left: Orthopedics consulted, status post ORIF.  PT/OT recommending SNF on discharge.  Continue pain management, supportive care, bowel regimen.  Orthopedics recommending follow-up as an outpatient after 2 weeks for wound check and repeat x-rays. Patient has bruise on the right great toe.  X-ray of the right great toe showed fracture of the base of distal phalanx.  Osteomyelitis is not excluded but patient does not have any ulcer.  At this point, osteomyelitis is less likely.  If there is drainage or  formation of ulcer in the future, can do MRI as an outpatient  Diverticulitis/colitis/severe leukocytosis: She was having 4 to 5 weeks history of lower abdominal pain.  CT imaging done on admission showed  pericolonic inflammation along the descending colon suggesting diverticulitis.  Her leukocytosis has severely worsened.   Antibiotics changed to Zosyn.  Blood cultures have remained negative so far.  She does not complain of any abdomen pain or dysuria today.  Abdomen is soft, nondistended, mostly non-tender with good bowel sounds.  But she started having multiple loose bowel movements today. CT abdomen/pelvis done on 10/2 showed progressive colitis with bowel edema.  Currently on full liquid diet.  Will check C. difficile.  We have also requested GI consultation.  Suspected UTI: Patient was confused, has leukocytosis, UA done on 10/2 shows some leukocytes.  Follow-up urine culture showed insignificant growth.  Patient not have dysuria  Chronic normocytic anemia: Follows with hematology, Dr. Pamelia Hoit  had blood transfusions in the past.  Monitor hemoglobin, currently stable  AKI in CKD stage IIIb: Currently kidney function at baseline.  Avoid nephrotoxins  History of recurrent DVTs: On Eliquis.  Eliquis  restarted   Hypertension: Triamterene-hydrochlorothiazide discontinued.  Monitor blood pressure, started on low-dose amlodipine  Chronic diastolic CHF: Home torsemide on hold due to AKI.  She says this does not take torsemide  anymore at home.  Currently appears euvolemic.  She may need  low-dose Lasix on discharge  Hyperkalemia: Resolved  Vitamin D deficiency: Started supplemented       DVT prophylaxis:apixaban (ELIQUIS) tablet 2.5 mg Start: 04/07/23 1000 SCDs Start: 04/06/23 0816 SCDs Start: 04/03/23 2019 apixaban (ELIQUIS) tablet 2.5 mg  NASAL specimens in patients 90 years of age and older), is one component of a comprehensive surveillance program. It is not intended to diagnose infection nor to guide or monitor treatment. Performed at [facility], [address] [place], [place] [address]   Urine Culture (for pregnant, neutropenic or urologic patients or  patients with an indwelling urinary catheter)     Status: Abnormal   Collection Time: 04/07/23  9:58 AM   Specimen: Urine, Clean Catch  Result Value Ref Range Status   Specimen Description URINE, CLEAN CATCH  Final   Special Requests NONE  Final   Culture (A)  Final    <10,000 COLONIES/mL INSIGNIFICANT GROWTH Performed at [facility], [address] [place], [place] [address]    Report Status 04/08/2023 FINAL  Final     Radiology Studies: DG Toe Great Right  Result Date: 04/09/2023 CLINICAL DATA:  Right great toe pain. EXAM: RIGHT GREAT TOE COMPARISON:  Right foot radiographs-03/26/2023 FINDINGS: Osteopenia. There is a potential minimally displaced obliquely oriented fracture involving the base of the distal phalanx of the great toe with associated geographic osteopenia involving the lateral aspect of the great toe. This finding is associated with adjacent soft tissue swelling. No subcutaneous emphysema. No radiopaque foreign body. Mild degenerative change of the first MTP joint with joint space loss, subchondral sclerosis and osteophytosis. No significant hallux valgus deformity. Adjacent joint spaces appear preserved given obliquity and field of view. IMPRESSION: Potential minimally displaced obliquely oriented fracture involving the base of the distal phalanx of the great toe with associated geographic osteopenia involving the lateral aspect of the great toe. While this is presumably the sequela of subacute injury, underlying infection is not excluded on the basis of this examination. Clinical correlation is advised. Further evaluation with MRI could be performed as indicated Electronically Signed   By: Simonne Come M.D.   On: 04/09/2023 10:13   VAS [place] LOWER EXTREMITY VENOUS (DVT)  Result Date: 04/07/2023  Lower Venous DVT Study Patient Name:  Chelsea Keller  Date of Exam:   04/07/2023 Medical Rec #: [id number]      Accession #:    [id number] Date of Birth: 09-13-1934      Patient Gender: F  Patient Age:   46 years Exam Location:  [facility] Procedure:      VAS [place] LOWER EXTREMITY VENOUS (DVT) Referring Phys: PARDEEP KHATRI --------------------------------------------------------------------------------  Indications: Pain.  Risk Factors: None identified. Limitations: Poor ultrasound/tissue interface and body habitus. Comparison Study: No prior studies. Performing Technologist: Chanda Busing RVT  Examination Guidelines: A complete evaluation includes B-mode imaging, spectral Doppler, color Doppler, and power Doppler as needed of all accessible portions of each vessel. Bilateral testing is considered an integral part of a complete examination. Limited examinations for reoccurring indications may be performed as noted. The reflux portion of the exam is performed with the patient in reverse Trendelenburg.  +---------+---------------+---------+-----------+----------+-------------------+ RIGHT    CompressibilityPhasicitySpontaneityPropertiesThrombus Aging      +---------+---------------+---------+-----------+----------+-------------------+ CFV      Full           Yes      Yes                                      +---------+---------------+---------+-----------+----------+-------------------+ SFJ      Full                                                             +---------+---------------+---------+-----------+----------+-------------------+  PROGRESS NOTE  Chelsea Keller  WUJ:[id number] DOB: [birth year] DOA: 04/03/2023 PCP: [name], MD   Brief Narrative: Patient is a 88 year old female with history of chronic diastolic CHF, hypertension, chronic blood loss anemia requiring blood transfusion, morbid obesity who presented to the emergency department with complaint of fall at home when she lost her balance while coming out of the bathroom.  She was also having 4 to 5 weeks onset of abdominal pain.  Left knee x-ray showed acute comminuted fracture of distal femoral metaphysis at the level of femoral complaint of knee replacement.  Orthopedics consulted, status post ORIF.  CT imaging showed pericolonic inflammation along the descending colon suggesting diverticulitis.  Started antibiotics.  PT/OT recommending SNF.  Hospital course remarkable for persistent leukocytosis.  Follow-up CT imaging on 10/12 showed persistent colitis.  Checking C. difficile.  GI also consulted.  Assessment & Plan:  Principal Problem:   Closed fracture of distal end of femur, unspecified fracture morphology, initial encounter (HCC) Active Problems:   Essential hypertension   Chronic diastolic CHF (congestive heart failure) (HCC)   Acute diarrhea   Anemia of chronic disease   Acute diverticulitis   Leukocytosis   Chronic kidney disease, stage 3b (HCC)   Recurrent deep vein thrombosis (DVT) (HCC)  Acute commuted fracture of distal femoral metaphysis on the left: Orthopedics consulted, status post ORIF.  PT/OT recommending SNF on discharge.  Continue pain management, supportive care, bowel regimen.  Orthopedics recommending follow-up as an outpatient after 2 weeks for wound check and repeat x-rays. Patient has bruise on the right great toe.  X-ray of the right great toe showed fracture of the base of distal phalanx.  Osteomyelitis is not excluded but patient does not have any ulcer.  At this point, osteomyelitis is less likely.  If there is drainage or  formation of ulcer in the future, can do MRI as an outpatient  Diverticulitis/colitis/severe leukocytosis: She was having 4 to 5 weeks history of lower abdominal pain.  CT imaging done on admission showed  pericolonic inflammation along the descending colon suggesting diverticulitis.  Her leukocytosis has severely worsened.   Antibiotics changed to Zosyn.  Blood cultures have remained negative so far.  She does not complain of any abdomen pain or dysuria today.  Abdomen is soft, nondistended, mostly non-tender with good bowel sounds.  But she started having multiple loose bowel movements today. CT abdomen/pelvis done on 10/2 showed progressive colitis with bowel edema.  Currently on full liquid diet.  Will check C. difficile.  We have also requested GI consultation.  Suspected UTI: Patient was confused, has leukocytosis, UA done on 10/2 shows some leukocytes.  Follow-up urine culture showed insignificant growth.  Patient not have dysuria  Chronic normocytic anemia: Follows with hematology, Dr. Pamelia Hoit  had blood transfusions in the past.  Monitor hemoglobin, currently stable  AKI in CKD stage IIIb: Currently kidney function at baseline.  Avoid nephrotoxins  History of recurrent DVTs: On Eliquis.  Eliquis  restarted   Hypertension: Triamterene-hydrochlorothiazide discontinued.  Monitor blood pressure, started on low-dose amlodipine  Chronic diastolic CHF: Home torsemide on hold due to AKI.  She says this does not take torsemide  anymore at home.  Currently appears euvolemic.  She may need  low-dose Lasix on discharge  Hyperkalemia: Resolved  Vitamin D deficiency: Started supplemented       DVT prophylaxis:apixaban (ELIQUIS) tablet 2.5 mg Start: 04/07/23 1000 SCDs Start: 04/06/23 0816 SCDs Start: 04/03/23 2019 apixaban (ELIQUIS) tablet 2.5 mg  PROGRESS NOTE  Chelsea Keller  WUJ:[id number] DOB: [birth year] DOA: 04/03/2023 PCP: [name], MD   Brief Narrative: Patient is a 88 year old female with history of chronic diastolic CHF, hypertension, chronic blood loss anemia requiring blood transfusion, morbid obesity who presented to the emergency department with complaint of fall at home when she lost her balance while coming out of the bathroom.  She was also having 4 to 5 weeks onset of abdominal pain.  Left knee x-ray showed acute comminuted fracture of distal femoral metaphysis at the level of femoral complaint of knee replacement.  Orthopedics consulted, status post ORIF.  CT imaging showed pericolonic inflammation along the descending colon suggesting diverticulitis.  Started antibiotics.  PT/OT recommending SNF.  Hospital course remarkable for persistent leukocytosis.  Follow-up CT imaging on 10/12 showed persistent colitis.  Checking C. difficile.  GI also consulted.  Assessment & Plan:  Principal Problem:   Closed fracture of distal end of femur, unspecified fracture morphology, initial encounter (HCC) Active Problems:   Essential hypertension   Chronic diastolic CHF (congestive heart failure) (HCC)   Acute diarrhea   Anemia of chronic disease   Acute diverticulitis   Leukocytosis   Chronic kidney disease, stage 3b (HCC)   Recurrent deep vein thrombosis (DVT) (HCC)  Acute commuted fracture of distal femoral metaphysis on the left: Orthopedics consulted, status post ORIF.  PT/OT recommending SNF on discharge.  Continue pain management, supportive care, bowel regimen.  Orthopedics recommending follow-up as an outpatient after 2 weeks for wound check and repeat x-rays. Patient has bruise on the right great toe.  X-ray of the right great toe showed fracture of the base of distal phalanx.  Osteomyelitis is not excluded but patient does not have any ulcer.  At this point, osteomyelitis is less likely.  If there is drainage or  formation of ulcer in the future, can do MRI as an outpatient  Diverticulitis/colitis/severe leukocytosis: She was having 4 to 5 weeks history of lower abdominal pain.  CT imaging done on admission showed  pericolonic inflammation along the descending colon suggesting diverticulitis.  Her leukocytosis has severely worsened.   Antibiotics changed to Zosyn.  Blood cultures have remained negative so far.  She does not complain of any abdomen pain or dysuria today.  Abdomen is soft, nondistended, mostly non-tender with good bowel sounds.  But she started having multiple loose bowel movements today. CT abdomen/pelvis done on 10/2 showed progressive colitis with bowel edema.  Currently on full liquid diet.  Will check C. difficile.  We have also requested GI consultation.  Suspected UTI: Patient was confused, has leukocytosis, UA done on 10/2 shows some leukocytes.  Follow-up urine culture showed insignificant growth.  Patient not have dysuria  Chronic normocytic anemia: Follows with hematology, Dr. Pamelia Hoit  had blood transfusions in the past.  Monitor hemoglobin, currently stable  AKI in CKD stage IIIb: Currently kidney function at baseline.  Avoid nephrotoxins  History of recurrent DVTs: On Eliquis.  Eliquis  restarted   Hypertension: Triamterene-hydrochlorothiazide discontinued.  Monitor blood pressure, started on low-dose amlodipine  Chronic diastolic CHF: Home torsemide on hold due to AKI.  She says this does not take torsemide  anymore at home.  Currently appears euvolemic.  She may need  low-dose Lasix on discharge  Hyperkalemia: Resolved  Vitamin D deficiency: Started supplemented       DVT prophylaxis:apixaban (ELIQUIS) tablet 2.5 mg Start: 04/07/23 1000 SCDs Start: 04/06/23 0816 SCDs Start: 04/03/23 2019 apixaban (ELIQUIS) tablet 2.5 mg

## 2023-04-09 NOTE — Progress Notes (Signed)
I have reviewed imaging of right great toe form this AM. Patient with minimally displaced fracture at the base of the distal phalanx. I will order hard sole shoes and patient may continue WBAT RLE.   Thompson Caul PA-C [facility] [phone] (office) [email or web address]

## 2023-04-09 NOTE — Plan of Care (Signed)
  Problem: Clinical Measurements: Goal: Will remain free from infection Outcome: Not Progressing   Problem: Activity: Goal: Risk for activity intolerance will decrease Outcome: Not Progressing   Problem: Elimination: Goal: Will not experience complications related to bowel motility Outcome: Not Progressing   Problem: Pain Managment: Goal: General experience of comfort will improve Outcome: Not Progressing   

## 2023-04-09 NOTE — Plan of Care (Signed)
  Problem: Activity: Goal: Risk for activity intolerance will decrease Outcome: Progressing   Problem: Coping: Goal: Level of anxiety will decrease Outcome: Progressing   Problem: Elimination: Goal: Will not experience complications related to bowel motility Outcome: Progressing

## 2023-04-09 NOTE — TOC Benefit Eligibility Note (Signed)
Patient Product/process development scientist completed.    The patient is insured through [employer]. Patient has Medicare and is not eligible for a copay card, but may be able to apply for patient assistance, if available.    Ran test claim for Dificid 200 mg and the current 10 day co-pay is $25.00.   This test claim was processed through [facility]- copay amounts may vary at other pharmacies due to pharmacy/plan contracts, or as the patient moves through the different stages of their insurance plan.     Roland Earl, CPHT Pharmacy Technician III Certified Patient Advocate [facility] Patient Advocate Team Direct Number: [phone]  Fax: [phone]

## 2023-04-09 NOTE — Consult Note (Addendum)
WOC Nurse Consult Note:  Reason for Consult: sacral pressure injury  Wound type: 1.  Deep Tissue Pressure Injury coccyx evolving  2.  Stage 2 L medial buttocks  3.  Moisture Associated Skin Damage  ICD-10 CM Codes for Irritant Dermatitis  L24A2 - Due to fecal, urinary or dual incontinence Pressure Injury POA: no  Measurement: 1.  Linear 3 cm x 0.5 cm Deep Tissue Pressure Injury Coccyx purple maroon discoloration with evolving partial thickness skin loss  2.  Stage 2 L upper medial buttock 2 cm x 2 cm 100% pink moist  3. Scattered partial thickness skin loss to buttocks  Wound bed: pink moist  Drainage (amount, consistency, odor) minimal serosanguinous  Periwound: erythema  Dressing procedure/placement/frequency: Clean buttocks/coccyx with soap and water dry, place a piece of Xeroform gauze [place] [phone]) to coccyx wound bed daily.  Cover with silicone foam or ABD pad whichever is preferred. Apply Gerhardt's to buttocks 3 times a day and prn soiling.   POC discussed with bedside nurse. WOC team will follow every 7-10 days to assess wounds.   Thank you,    Priscella Mann MSN, RN-BC, [employer] [phone]

## 2023-04-09 NOTE — Progress Notes (Addendum)
Orthopaedic Trauma Progress Note  SUBJECTIVE: Continues to note moderate pain in left leg.  Also having some pain to the right foot.  Medications do seem to be helping.  X-ray of the right foot performed on 04/03/2023 was negative for any acute bony abnormalities. Denies any significant numbness or tingling throughout the legs.  No chest pain. No SOB. No nausea/vomiting. No other complaints.  Patient continues to have leukocytosis.  U/A with some leukocytes present, culture with no significant growth.  As per primary team to evaluate patient to rule out surgical wounds as source of infection.  Blood cultures have been ordered this morning. Venous ultrasound BLE completed on 04/07/2023 was negative for DVT.  OBJECTIVE:  Vitals:   04/09/23 0437 04/09/23 0720  BP: 129/60 120/72  Pulse: 97 84  Resp:    Temp:  (!) 97.5 F (36.4 C)  SpO2: 93% 94%    General: Laying in bed, no acute distress.   Respiratory: No increased work of breathing.  Left lower extremity: Incisions are clean, dry, intact.  No areas of surrounding erythema.  No other signs of infection.  Moderate bruising about the knee to be expected.  Tender to palpation over the knee and and throughout the lateral thigh as expected.  Soreness to the calf but no specific areas of significant calf tenderness.  Tolerates gentle ankle range of motion.  Able to wiggle toes.  Endorses sensation of light touch over all aspects of the foot.  + DP pulse  Right lower extremity: Mild swelling throughout the extremity.  Tenderness throughout the calf.  Ankle DF/PF stiff but intact.  Significant bruising over the great toe.  Small  sore noted over the lateral border of the fourth toe, present on admission. Does not appear infected. Notes tenderness with palpation of the toes. + DP pulse  IMAGING: Stable post op imaging left lower extremity.   LABS:  Results for orders placed or performed during the hospital encounter of 04/03/23 (from the past 24 hour(s))   CBC     Status: Abnormal   Collection Time: 04/08/23  6:19 PM  Result Value Ref Range   WBC 36.0 (H) 4.0 - 10.5 K/uL   RBC 3.85 (L) 3.87 - 5.11 MIL/uL   Hemoglobin 10.5 (L) 12.0 - 15.0 g/dL   HCT 16.1 (L) 09.6 - 04.5 %   MCV 88.3 80.0 - 100.0 fL   MCH 27.3 26.0 - 34.0 pg   MCHC 30.9 30.0 - 36.0 g/dL   RDW 40.9 (H) 81.1 - 91.4 %   Platelets 192 150 - 400 K/uL   nRBC 0.5 (H) 0.0 - 0.2 %  CBC     Status: Abnormal   Collection Time: 04/09/23  3:30 AM  Result Value Ref Range   WBC 43.5 (H) 4.0 - 10.5 K/uL   RBC 3.98 3.87 - 5.11 MIL/uL   Hemoglobin 10.8 (L) 12.0 - 15.0 g/dL   HCT 78.2 (L) 95.6 - 21.3 %   MCV 87.9 80.0 - 100.0 fL   MCH 27.1 26.0 - 34.0 pg   MCHC 30.9 30.0 - 36.0 g/dL   RDW 08.6 (H) 57.8 - 46.9 %   Platelets 159 150 - 400 K/uL   nRBC 0.4 (H) 0.0 - 0.2 %  Basic metabolic panel     Status: Abnormal   Collection Time: 04/09/23  3:30 AM  Result Value Ref Range   Sodium 138 135 - 145 mmol/L   Potassium 3.6 3.5 - 5.1 mmol/L   Chloride 99  98 - 111 mmol/L   CO2 23 22 - 32 mmol/L   Glucose, Bld 101 (H) 70 - 99 mg/dL   BUN 71 (H) 8 - 23 mg/dL   Creatinine, Ser 9.60 (H) 0.44 - 1.00 mg/dL   Calcium 8.1 (L) 8.9 - 10.3 mg/dL   GFR, Estimated 31 (L) >60 mL/min   Anion gap 16 (H) 5 - 15    ASSESSMENT: Chelsea Keller is a 88 y.o. female, 4 Days Post-Op s/p OPEN REDUCTION INTERNAL FIXATION LEFT DISTAL FEMUR FRACTURE  CV/Blood loss: Acute blood loss anemia, Hgb 10.8 this morning. Received 1 unit PRBCs 04/06/23. Hemodynamically stable  PLAN: Weightbearing: TDWB LLE ROM: Unrestricted ROM Incisional and dressing care: Okay to leave incision open to air  Showering: Okay to begin showering and getting incisions wet 04/08/2023 Orthopedic device(s): None  Pain management:  1. Tylenol 1000 mg q 6 hours scheduled 2. Robaxin 500 mg q 6 hours PRN 3. Oxycodone 5 mg q 4 hours PRN 4. Dilaudid 0.5 mg q 3 hours PRN 5. Tramadol 50 mg q 6 hours PRN 6. Gabapentin 300-600 mg BID VTE  prophylaxis: Eliquis.  SCDs ID: Currently on Zosyn for intra-abdominal infection Foley/Lines:  No foley, KVO IVFs Impediments to Fracture Healing: Vitamin D level 5, started on Vit D2 supplementation  Dispo: LLE surgical incisions do not appear to be source of patient's increasing leukocytosis.  Blood cultures have been ordered per primary team.  I will order dedicated film of right great toe due to significant bruising and pain on exam today.  Will continue to monitor sore on 4th toe. No formal treatment need tat this time. PT/OT evaluation ongoing, recommending SNF. Patient and daughter agreeable. TOC following for placement.    D/C recommendations: -Oxycodone 5 mg, Robaxin for pain control -Home dose Eliquis for DVT prophylaxis -Continue 50,000 units Vit D2 supplementation weekly x 5 weeks  Follow - up plan: Will continue to follow patient remotely while inpatient.  Plan for outpatient follow-up with Dr. Jena Gauss 2 weeks after discharge for wound check and repeat x-rays   Contact information:  Truitt Merle MD, Thyra Breed PA-C. After hours and holidays please check [email or web address] for group call information for [facility]   Thompson Caul, PA-C [phone] (office) [email or web address]

## 2023-04-09 NOTE — Consult Note (Addendum)
Attending physician's note   I have taken a history, reviewed the chart, and examined the patient. I performed a substantive portion of this encounter, including complete performance of at least one of the key components, in conjunction with the APP. I agree with the APP's note, impression, and recommendations with my edits.   88 year old female with medical history as outlined below, admitted 03/26/2023 with femur fracture/fall.  Prior to admission, had been having diarrhea and abdominal pain.  Started taking antidiarrheal agents at home with subsequent constipation.  CT on admission initially concerning for left-sided diverticulitis, but repeat CT on 10/2 with diffuse colonic inflammation with mild wall thickening throughout.  C. difficile earlier today with positive toxin and antigen.  WBC markedly elevated at 43K.  H/H otherwise stable.  1) C. difficile colitis 2) Diarrhea 3) Leukocytosis 4) AKI on CKD - Started on Dificid - Can stop Zosyn - Daily CBC and BMP -GI service will continue to follow  Doristine Locks, DO, FACG [phone] office                                                     Consultation Note   Referring Provider:  [facility] PCP: [name], MD Primary Gastroenterologist:  Claudette Head, MD        Reason for Consultation: diverticulitis / colitis  DOA: 04/03/2023         Hospital Day: 7   ASSESSMENT    Brief Narrative:  88 y.o. year old female with a history of colonic angiodysplastic lesions, left sided diverticulosis, advanced colon polyps, hemorrhoids, gastric ulcer, morbid obesity, chronic diastolic heart failure, HTN. Admitted several days ago after a mechanical fall / femur fracture. At time of admission she also complained of abdominal pain.  CT  scan with contrast >> diverticulitis. Follow up CT scan suggesting colitis.   Acute left distal femur fracture ( reason for admission), s/p ORIF this admission   Abnormal CT AP. Initial  scan on 9/28 suggested left sided diverticulitis but follow up non-contrast CT scan on 10/2 showing progressive mild, relatively diffuse colonic inflammation suggesting colitis. Rule out infectious colitis, especially given history of diarrhea at home over last few weeks . Seems she may have constipated herself with anti-diarrheal agents prior to admission and now getting colace and was getting Miralax. Today's BMs not yet charted but I contacted RN and patient has had 3-4 BMs this am.   ADDENDUM: C-diff positive. Dificid has already been started.  Recommend stopping Zosyn ( if not already done)   Marked, progressive leukocytosis,  WBC 43K , ? related to colitis / diverticulitis. Thought to have UTI but urine culture negative.   AKI on CKD Cr 1.58 GFR 31  Acute on chronic iron deficiency anemia. Worsening anemia  possibly multifactorial ( orthopedic surgery, AKI on CKD, possible chronic GI blood loss)  History of DVTs, on Eliquis  History of advanced colon polyps in 2016, no polyps on last colonoscopy in 2019    PLAN:   Await C-diff If C-diff positive should stop Zosyn and also colace for now  HPI   Dymon had a fall at home, fractured femur and has undergoing ORIF. On admission she also complained of abdominal pain and CT scan suggested left sided diverticulitis.  She isn't providing clear details about recent bowel changes. The  best I can tell she has been having diarrhea and abdominal pain at home for several weeks. She was taking antidiarrheal medications and apparently became constipated so has been on colace in the hospital. She tells me that prior to a few weeks ago her bowel movements were "normal". She initially didn't recall having taken any recent antibiotics but then mentions that she took Keflex at some point but doesn't know why. Some nausea without vomiting. Appetite is diminished. She doesn't think she has had any blood in her stools   Previous GI workup Colonoscopy Sept  2019 for IDA - Multiple non-bleeding colonic angiodysplastic lesions. Treated with argon plasma coagulation (APC). - Diverticulosis in the left colon. - Internal hemorrhoids. - The examination was otherwise normal on direct and retroflexion views. - No specimens collected.  EGD / colonoscopy April 2016 for IDA / FOBT+ -Mild gastritis, o/w negative  Colon:  - Four polyps in the sigmoid colon; polypectomies performed with a cold snare and using snare cautery 2. Three sessile polyps in the descending, transverse, and rectum; polypectomies performed with a cold snare 3. Three angiodysplastic lesions in the transverse colon and ascending colon 4. Mild diverticulosis in the transverse colon and ascending colon 5. Moderate diverticulosis in the sigmoid colon and descending colon 6. Grade l internal hemorrhoids Colon, biopsy, transverse, descending, 4 sigmoid, rectum - TUBULOVILLOUS ADENOMA (1), TUBULAR ADENOMA (2), SESSILE SERRATED POLYP (2) AND HYPERPLASTIC POLYP (2). - NO HIGH GRADE DYSPLASIA OR MALIGNANCY IDENTIFIED. 2. Duodenum, NOS biopsy, r/o celiac disease - UNREMARKABLE DUODENAL MUCOSA. - NO FEATURES OF SPRUE, ACTIVE INFLAMMATION OR GRANULOMAS   Labs and Imaging: Recent Labs    04/07/23 0518 04/08/23 1819 04/09/23 0330  WBC 25.1* 36.0* 43.5*  HGB 10.1* 10.5* 10.8*  HCT 30.6* 34.0* 35.0*  PLT 152 192 159   Recent Labs    04/07/23 0518 04/08/23 0519 04/09/23 0330  NA 133* 134* 138  K 4.4 4.2 3.6  CL 102 100 99  CO2 18* 24 23  GLUCOSE 101* 110* 101*  BUN 54* 62* 71*  CREATININE 1.41* 1.39* 1.59*  CALCIUM 8.1* 8.0* 8.1*   No results for input(s): "PROT", "ALBUMIN", "AST", "ALT", "ALKPHOS", "BILITOT", "BILIDIR", "IBILI" in the last 72 hours. No results for input(s): "HEPBSAG", "HCVAB", "HEPAIGM", "HEPBIGM" in the last 72 hours. No results for input(s): "LABPROT", "INR" in the last 72 hours.    Past Medical History:  Diagnosis Date   Allergy    Anemia    Anxiety     Arthritis    Arthritis of sacroiliac joint of both sides 11/12/2017   Bilateral pulmonary embolism (HCC) 06/23/2017   Chronic diastolic CHF (congestive heart failure) (HCC)    a. Echo 1/16:  mild LVH, EF normal, grade 1 DD, MAC   Chronic venous insufficiency    chronic LE edema   DDD (degenerative disc disease), lumbar 11/12/2017   Degenerative joint disease (DJD) of hip, Bilateral  11/12/2017   Depression    Fibromyalgia    constant pain   Hx of cardiac catheterization    a. [facility] in [place] "ok" per patient with mild plaque in a single vessel - records not available   Hx of cardiovascular stress test    a. Nuclear study in 2008 normal   Hx of colonic polyps    Hypertension    Hypertriglyceridemia    Impaired fasting glucose    PONV (postoperative nausea and vomiting)    PUD (peptic ulcer disease)    hx of gastric ulcer  Pulmonary emboli (HCC) 06/2017   Vitamin B12 deficiency     Past Surgical History:  Procedure Laterality Date   ABDOMINAL HYSTERECTOMY     CATARACT EXTRACTION  10/2003   OD   CHOLECYSTECTOMY     COLONOSCOPY W/ POLYPECTOMY     COLONOSCOPY WITH PROPOFOL N/A 10/15/2014   Procedure: COLONOSCOPY WITH PROPOFOL;  Surgeon: Meryl Dare, MD;  Location: [facility] ENDOSCOPY;  Service: Endoscopy;  Laterality: N/A;   COLONOSCOPY WITH PROPOFOL N/A 03/23/2018   Procedure: COLONOSCOPY WITH PROPOFOL;  Surgeon: Iva Boop, MD;  Location: [facility] ENDOSCOPY;  Service: Endoscopy;  Laterality: N/A;   ESOPHAGOGASTRODUODENOSCOPY (EGD) WITH PROPOFOL N/A 10/15/2014   Procedure: ESOPHAGOGASTRODUODENOSCOPY (EGD) WITH PROPOFOL;  Surgeon: Meryl Dare, MD;  Location: [facility] ENDOSCOPY;  Service: Endoscopy;  Laterality: N/A;   EXTERNAL FIXATION ANKLE FRACTURE     Fx.  left ankle-fixation with pins later removed sec to infection 1985   EXTERNAL FIXATION WRIST FRACTURE  1985   left with pins   EYE SURGERY     FEMUR FRACTURE SURGERY  06/2009   FRACTURE SURGERY     HARDWARE REMOVAL Left 10/05/2013    Procedure: HARDWARE REMOVAL LEFT DISTAL FEMUR;  Surgeon: Budd Palmer, MD;  Location: [facility] OR;  Service: Orthopedics;  Laterality: Left;   HEMIARTHROPLASTY SHOULDER FRACTURE  06/2009   HOT HEMOSTASIS N/A 03/23/2018   Procedure: HOT HEMOSTASIS (ARGON PLASMA COAGULATION/BICAP);  Surgeon: Iva Boop, MD;  Location: [facility] ENDOSCOPY;  Service: Endoscopy;  Laterality: N/A;   JOINT REPLACEMENT     STERIOD INJECTION Right 10/05/2013   Procedure: STEROID INJECTION;  Surgeon: Budd Palmer, MD;  Location: [facility] OR;  Service: Orthopedics;  Laterality: Right;   TONSILLECTOMY     TONSILLECTOMY     TOTAL KNEE ARTHROPLASTY  03/09   left    Family History  Problem Relation Age of Onset   Depression Mother    Cancer Mother        uterine cancer   Heart attack Father    Cancer Brother        prostate cancer   Diabetes Maternal Aunt    Arthritis Brother    Asthma Brother    Stroke Maternal Grandmother    Pulmonary embolism Daughter     Prior to Admission medications   Medication Sig Start Date End Date Taking? Authorizing Provider  acetaminophen (TYLENOL) 500 MG tablet Take 1,500 mg by mouth every 12 (twelve) hours as needed (for pain- with each dose of Tramadol).   Yes [provider]  allopurinol (ZYLOPRIM) 100 MG tablet TAKE 1 TABLET BY MOUTH EVERY DAY 01/11/23  Yes Tillman Abide I, MD  allopurinol (ZYLOPRIM) 300 MG tablet TAKE 1 TABLET BY MOUTH EVERY DAY 01/11/23  Yes [name], MD  colchicine 0.6 MG tablet Take 1 tablet (0.6 mg total) by mouth 2 (two) times daily. 12/08/22  Yes [name], MD  ELIQUIS 2.5 MG TABS tablet TAKE 1 TABLET TWICE A DAY 01/21/23  Yes [name], MD  fluconazole (DIFLUCAN) 150 MG tablet Take 1 tablet (150 mg total) by mouth once a week. 03/31/23  Yes [name], MD  gabapentin (NEURONTIN) 300 MG capsule TAKE 1 CAPSULE IN THE MORNING AND 2 CAPSULES AT BEDTIME Patient taking differently: Take 300-600 mg by mouth See admin instructions. Take  300 mg by mouth in the morning and 600 mg with supper 07/27/22  Yes [name], MD  hydrOXYzine (ATARAX) 10 MG tablet TAKE 1 TABLET  BY MOUTH THREE TIMES A DAY AS NEEDED 02/24/23  Yes Tillman Abide I, MD  KLOR-CON M20 20 MEQ tablet TAKE 2 TABLETS (40 MEQ TOTAL) DAILY Patient taking differently: Take 40 mEq by mouth in the morning. 04/28/22  Yes [name], MD  ondansetron (ZOFRAN) 4 MG tablet Take 1 tablet (4 mg total) by mouth every 8 (eight) hours as needed for nausea or vomiting. 03/15/23  Yes [name], MD  torsemide (DEMADEX) 20 MG tablet Take 20 mg by mouth daily as needed (for fluid).   Yes [provider]  traMADol (ULTRAM) 50 MG tablet Take 1 tablet (50 mg total) by mouth 3 (three) times daily as needed for moderate pain. 03/19/23  Yes [name], MD  triamterene-hydrochlorothiazide (MAXZIDE-25) 37.5-25 MG tablet TAKE 1 TABLET DAILY 03/25/23  Yes [name], MD  famotidine (PEPCID) 40 MG tablet Take 1 tablet (40 mg total) by mouth at bedtime. Patient not taking: Reported on 04/03/2023 02/01/23   [name], MD  methocarbamol (ROBAXIN) 500 MG tablet Take 1 tablet (500 mg total) by mouth every 6 (six) hours as needed for muscle spasms. 04/07/23   West Bali, PA-C  oxyCODONE (OXY IR/ROXICODONE) 5 MG immediate release tablet Take 1 tablet (5 mg total) by mouth every 4 (four) hours as needed for severe pain (pain score 7-10). 04/07/23   [place] Bali, PA-C  Vitamin D, Ergocalciferol, (DRISDOL) 1.25 MG (50000 UNIT) CAPS capsule Take 1 capsule (50,000 Units total) by mouth every 7 (seven) days. 04/14/23   West Bali, PA-C    Current Facility-Administered Medications  Medication Dose Route Frequency Provider Last Rate Last Admin   0.9 %  sodium chloride infusion (Manually program via Guardrails IV Fluids)   Intravenous Once Willeen Niece, MD       0.9 %  sodium chloride infusion (Manually program via Guardrails IV Fluids)   Intravenous  Once Willeen Niece, MD       0.9 %  sodium chloride infusion   Intravenous Continuous McClung, Sarah A, PA-C       acetaminophen (TYLENOL) tablet 1,000 mg  1,000 mg Oral Q6H McClung, Sarah A, PA-C   1,000 mg at 04/09/23 0505   alum & mag hydroxide-simeth (MAALOX/MYLANTA) 200-200-20 MG/5ML suspension 15 mL  15 mL Oral Q6H PRN Willeen Niece, MD       amLODipine (NORVASC) tablet 5 mg  5 mg Oral Daily Burnadette Pop, MD   5 mg at 04/08/23 0839   apixaban (ELIQUIS) tablet 2.5 mg  2.5 mg Oral BID Rexford Maus, RPH   2.5 mg at 04/08/23 2013   camphor-menthol (SARNA) lotion   Topical PRN Luiz Iron, NP   Given at 04/06/23 0243   diphenhydrAMINE (BENADRYL) 12.5 MG/5ML elixir 12.5-25 mg  12.5-25 mg Oral Q4H PRN West Bali, PA-C   25 mg at 04/06/23 2027   docusate sodium (COLACE) capsule 100 mg  100 mg Oral BID West Bali, PA-C   100 mg at 04/08/23 2014   furosemide (LASIX) tablet 20 mg  20 mg Oral Daily Burnadette Pop, MD   20 mg at 04/08/23 0839   gabapentin (NEURONTIN) capsule 300 mg  300 mg Oral q morning Willeen Niece, MD   300 mg at 04/08/23 1610   gabapentin (NEURONTIN) capsule 600 mg  600 mg Oral Q supper Willeen Niece, MD   600 mg at 04/08/23 1752   HYDROmorphone (DILAUDID) injection 0.5 mg  0.5 mg Intravenous Q4H PRN  West Bali, PA-C   0.5 mg at 04/08/23 2013   loperamide (IMODIUM) capsule 2 mg  2 mg Oral Q6H PRN Burnadette Pop, MD       melatonin tablet 5 mg  5 mg Oral QHS West Bali, PA-C   5 mg at 04/08/23 2013   methocarbamol (ROBAXIN) tablet 500 mg  500 mg Oral Q6H PRN West Bali, PA-C   500 mg at 04/08/23 1610   Or   methocarbamol (ROBAXIN) 500 mg in dextrose 5 % 50 mL IVPB  500 mg Intravenous Q6H PRN West Bali, PA-C       metoCLOPramide (REGLAN) tablet 5-10 mg  5-10 mg Oral Q8H PRN Sharon Seller, Sarah A, PA-C       Or   metoCLOPramide (REGLAN) injection 5-10 mg  5-10 mg Intravenous Q8H PRN West Bali, PA-C       ondansetron  (ZOFRAN) tablet 4 mg  4 mg Oral Q6H PRN West Bali, PA-C       Or   ondansetron (ZOFRAN) injection 4 mg  4 mg Intravenous Q6H PRN Sharon Seller, Sarah A, PA-C       oxyCODONE (Oxy IR/ROXICODONE) immediate release tablet 5 mg  5 mg Oral Q4H PRN West Bali, PA-C   5 mg at 04/06/23 2027   piperacillin-tazobactam (ZOSYN) IVPB 3.375 g  3.375 g Intravenous Q8H Rexford Maus, RPH 12.5 mL/hr at 04/09/23 0506 3.375 g at 04/09/23 0506   prochlorperazine (COMPAZINE) injection 10 mg  10 mg Intravenous Q6H PRN West Bali, PA-C   10 mg at 04/03/23 2056   traMADol (ULTRAM) tablet 50 mg  50 mg Oral Q12H PRN West Bali, PA-C   50 mg at 04/06/23 9604   Vitamin D (Ergocalciferol) (DRISDOL) 1.25 MG (50000 UNIT) capsule 50,000 Units  50,000 Units Oral Q7 days Burnadette Pop, MD   50,000 Units at 04/07/23 5409    Allergies as of 04/03/2023 - Review Complete 04/03/2023  Allergen Reaction Noted   Codeine sulfate Shortness Of Breath    Celecoxib Other (See Comments)    Erythromycin base Nausea And Vomiting    Spironolactone Nausea Only and Other (See Comments) 03/18/2020    Social History   Socioeconomic History   Marital status: Widowed    Spouse name: Not on file   Number of children: 4   Years of education: Not on file   Highest education level: Not on file  Occupational History   Occupation: retired crossing guard   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  Tobacco Use   Smoking status: Former    Current packs/day: 0.00    Average packs/day: 1 pack/day for 40.0 years (40.0 ttl pk-yrs)    Types: Cigarettes    Start date: 07/06/1953    Quit date: 07/06/1993    Years since quitting: 29.7    Passive exposure: Past   Smokeless tobacco: Never  Vaping Use   Vaping status: Never Used  Substance and Sexual Activity   Alcohol use: No    Alcohol/week: 0.0 standard drinks of alcohol   Drug use: No   Sexual activity: Not Currently  Other Topics Concern   Not on file  Social  History Narrative   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[employer], lives w/ daughter and adult grandsons   Former smoker, no EtOH      No living will   Plans to do health care POA forms---wants daughter Arline Asp  Would accept resuscitation attempts---no prolonged ventilation   Absolutely no feeding tube   Social Determinants of Health   Financial Resource Strain: Not on file  Food Insecurity: No Food Insecurity (11/12/2022)   Hunger Vital Sign    Worried About Running Out of Food in the Last Year: Never true    Ran Out of Food in the Last Year: Never true  Transportation Needs: No Transportation Needs (11/12/2022)   PRAPARE - Administrator, Civil Service (Medical): No    Lack of Transportation (Non-Medical): No  Physical Activity: Not on file  Stress: Not on file  Social Connections: Not on file  Intimate Partner Violence: Not At Risk (11/12/2022)   Humiliation, Afraid, Rape, and Kick questionnaire    Fear of Current or Ex-Partner: No    Emotionally Abused: No    Physically Abused: No    Sexually Abused: No     Code Status   Code Status: Full Code  Review of Systems: All systems reviewed and negative except where noted in HPI.  Physical Exam: Vital signs in last 24 hours: Temp:  [97.5 F (36.4 C)-98.7 F (37.1 C)] 97.5 F (36.4 C) (10/04 0720) Pulse Rate:  [84-97] 84 (10/04 0720) Resp:  [17-18] 18 (10/03 1938) BP: (120-129)/(60-72) 120/72 (10/04 0720) SpO2:  [93 %-96 %] 94 % (10/04 0720) Last BM Date : 04/09/23  General:  Pleasant female in NAD. Deconditioned Psych:  Cooperative. Normal mood and affect Eyes: Pupils equal Ears:  Normal auditory acuity Nose: No deformity, discharge or lesions Neck:  Supple, no masses felt Lungs:  Clear to auscultation.  Heart:  Regular rate, regular rhythm.  Abdomen:  Soft, nondistended, RUQ tenderness, active bowel sounds, no masses felt Rectal :  Deferred Msk: Symmetrical without gross  deformities.  Neurologic:  Alert, oriented, grossly normal neurologically Skin:  Intact without significant lesions.    Intake/Output from previous day: 10/03 0701 - 10/04 0700 In: 480 [P.O.:480] Out: 800 [Urine:800] Intake/Output this shift:  No intake/output data recorded.  Principal Problem:   Closed fracture of distal end of femur, unspecified fracture morphology, initial encounter [facility]) Active Problems:   Essential hypertension   Chronic diastolic CHF (congestive heart failure) (HCC)   Acute diarrhea   Anemia of chronic disease   Acute diverticulitis   Leukocytosis   Chronic kidney disease, stage 3b (HCC)   Recurrent deep vein thrombosis (DVT) (HCC)    Willette Cluster, NP-C   04/09/2023, 10:51 AM

## 2023-04-10 DIAGNOSIS — S72409A Unspecified fracture of lower end of unspecified femur, initial encounter for closed fracture: Secondary | ICD-10-CM | POA: Diagnosis not present

## 2023-04-10 DIAGNOSIS — A0472 Enterocolitis due to Clostridium difficile, not specified as recurrent: Secondary | ICD-10-CM | POA: Diagnosis not present

## 2023-04-10 LAB — CBC
HCT: 31.7 % — ABNORMAL LOW (ref 36.0–46.0)
Hemoglobin: 9.9 g/dL — ABNORMAL LOW (ref 12.0–15.0)
MCH: 27.3 pg (ref 26.0–34.0)
MCHC: 31.2 g/dL (ref 30.0–36.0)
MCV: 87.3 fL (ref 80.0–100.0)
Platelets: 162 10*3/uL (ref 150–400)
RBC: 3.63 MIL/uL — ABNORMAL LOW (ref 3.87–5.11)
RDW: 21.6 % — ABNORMAL HIGH (ref 11.5–15.5)
WBC: 44.4 10*3/uL — ABNORMAL HIGH (ref 4.0–10.5)
nRBC: 0.4 % — ABNORMAL HIGH (ref 0.0–0.2)

## 2023-04-10 LAB — BASIC METABOLIC PANEL
Anion gap: 11 (ref 5–15)
BUN: 81 mg/dL — ABNORMAL HIGH (ref 8–23)
CO2: 25 mmol/L (ref 22–32)
Calcium: 8 mg/dL — ABNORMAL LOW (ref 8.9–10.3)
Chloride: 97 mmol/L — ABNORMAL LOW (ref 98–111)
Creatinine, Ser: 1.54 mg/dL — ABNORMAL HIGH (ref 0.44–1.00)
GFR, Estimated: 33 mL/min — ABNORMAL LOW (ref >60)
Glucose, Bld: 107 mg/dL — ABNORMAL HIGH (ref 70–99)
Potassium: 3.6 mmol/L (ref 3.5–5.1)
Sodium: 133 mmol/L — ABNORMAL LOW (ref 135–145)

## 2023-04-10 NOTE — Progress Notes (Signed)
PROGRESS NOTE  Chelsea Keller  WNU:[id number] DOB: March 30, 1935 DOA: 04/03/2023 PCP: [name], MD   Brief Narrative: Patient is a 88 year old female with history of chronic diastolic CHF, hypertension, chronic blood loss anemia requiring blood transfusion, morbid obesity who presented to the emergency department with complaint of fall at home when she lost her balance while coming out of the bathroom.  She was also having 4 to 5 weeks onset of abdominal pain.  Left knee x-ray showed acute comminuted fracture of distal femoral metaphysis at the level of femoral complaint of knee replacement.  Orthopedics consulted, status post ORIF.  CT imaging showed pericolonic inflammation along the descending colon suggesting diverticulitis.  Started antibiotics.  PT/OT recommending SNF.  Hospital course remarkable for persistent leukocytosis.  Follow-up CT imaging on 10/12 showed persistent colitis.  C diff checked on 10/4, found to be positive.  Started on Dificid  Assessment & Plan:  Principal Problem:   Closed fracture of distal end of femur, unspecified fracture morphology, initial encounter (HCC) Active Problems:   Essential hypertension   Chronic diastolic CHF (congestive heart failure) (HCC)   Acute diarrhea   Anemia of chronic disease   Acute diverticulitis   Leukocytosis   Chronic kidney disease, stage 3b (HCC)   Recurrent deep vein thrombosis (DVT) (HCC)   C. difficile colitis   Infectious diarrhea  Acute commuted fracture of distal femoral metaphysis on the left: Orthopedics consulted, status post ORIF.  PT/OT recommending SNF on discharge.  Continue pain management, supportive care, bowel regimen.  Orthopedics recommending follow-up as an outpatient after 2 weeks for wound check and repeat x-rays. Patient has bruise on the right great toe.  X-ray of the right great toe showed fracture of the base of distal phalanx.  Osteomyelitis is not excluded but patient does not have any ulcer.  At  this point, osteomyelitis is less likely.  If there is drainage or formation of ulcer in the future, can do MRI as an outpatient  C. difficile colitis/severe leukocytosis: She was having 4 to 5 weeks history of lower abdominal pain.  CT imaging done on admission showed  pericolonic inflammation along the descending colon suggesting diverticulitis.  Her leukocytosis severely worsened. CT abdomen/pelvis done on 10/2 showed progressive colitis with bowel edema.    Antibiotics changed to Zosyn.  Blood cultures have remained negative so far.  She started having worsening of diarrhea on 10/4.  C. difficile came out to be positive. Started on Dificid.  Zosyn discontinued.  GI also following.  Diarrhea is slowing down.  Denies any abd pain, nausea or vomiting today  Suspected UTI: Patient was confused, has leukocytosis, UA done on 10/2 shows some leukocytes.  Follow-up urine culture showed insignificant growth.  Patient not have dysuria  Chronic normocytic anemia: Follows with hematology, Dr. Pamelia Hoit  had blood transfusions in the past.  Monitor hemoglobin, currently stable  AKI in CKD stage IIIb: Currently kidney function at baseline.  Avoid nephrotoxins  History of recurrent DVTs: On Eliquis.  Eliquis  restarted   Hypertension: Triamterene-hydrochlorothiazide discontinued.  Monitor blood pressure, started on low-dose amlodipine  Chronic diastolic CHF: Home torsemide on hold due to AKI.  She says this does not take torsemide  anymore at home.  Currently appears euvolemic.  She may need  low-dose Lasix on discharge  Hyperkalemia: Resolved  Vitamin D deficiency: Started supplemented       DVT prophylaxis:apixaban (ELIQUIS) tablet 2.5 mg Start: 04/07/23 1000 SCDs Start: 04/06/23 0816 SCDs Start: 04/03/23 2019 apixaban (  ELIQUIS) tablet 2.5 mg     Code Status: Full Code  Family Communication: Called and discussed with daughter on phone on 10/4  Patient status:Inpatient  Patient is from  :Home  Anticipated discharge to:SNF  Estimated DC date: After improvement in the leukocytosis,diarrhea   Consultants: Orthopedics  Procedures: ORIF  Antimicrobials:  Anti-infectives (From admission, onward)    Start     Dose/Rate Route Frequency Ordered Stop   04/09/23 1315  fidaxomicin (DIFICID) tablet 200 mg        200 mg Oral 2 times daily 04/09/23 1226 04/19/23 0959   04/07/23 1045  piperacillin-tazobactam (ZOSYN) IVPB 3.375 g  Status:  Discontinued        3.375 g 12.5 mL/hr over 240 Minutes Intravenous Every 8 hours 04/07/23 0952 04/09/23 1226   04/06/23 2200  Ampicillin-Sulbactam (UNASYN) 3 g in sodium chloride 0.9 % 100 mL IVPB  Status:  Discontinued        3 g 200 mL/hr over 30 Minutes Intravenous Every 12 hours 04/06/23 0756 04/07/23 0947   04/05/23 1754  vancomycin (VANCOCIN) powder  Status:  Discontinued          As needed 04/05/23 1754 04/05/23 1808   04/05/23 1545  ceFAZolin (ANCEF) IVPB 2g/100 mL premix  Status:  Discontinued        2 g 200 mL/hr over 30 Minutes Intravenous On call to O.R. 04/05/23 1516 04/05/23 1550   04/05/23 1529  ceFAZolin (ANCEF) 2-4 GM/100ML-% IVPB  Status:  Discontinued       Note to Pharmacy: Shanda Bumps M: cabinet override      04/05/23 1529 04/05/23 1914   04/04/23 2200  Ampicillin-Sulbactam (UNASYN) 3 g in sodium chloride 0.9 % 100 mL IVPB  Status:  Discontinued        3 g 200 mL/hr over 30 Minutes Intravenous Every 8 hours 04/03/23 2025 04/06/23 0756   04/03/23 1800  Ampicillin-Sulbactam (UNASYN) 3 g in sodium chloride 0.9 % 100 mL IVPB        3 g 200 mL/hr over 30 Minutes Intravenous  Once 04/03/23 1747 04/03/23 2100       Subjective: Patient seen and examined at bedside today.  Hemodynamically stable.  Lying in bed.  She says her diarrhoea has slowed down. No complaint of nausea, vomiting or abdominal pain.  Objective: Vitals:   04/09/23 1604 04/09/23 2154 04/10/23 0629 04/10/23 0753  BP: (!) 151/49 125/68 132/74 138/66   Pulse: 88 92 84 76  Resp:   18 16  Temp: 97.9 F (36.6 C) 97.7 F (36.5 C) 98 F (36.7 C) 97.9 F (36.6 C)  TempSrc: Oral Oral Oral Oral  SpO2: 97% 96% 94% 96%  Weight:      Height:       No intake or output data in the 24 hours ending 04/10/23 1032  Filed Weights   04/03/23 1339  Weight: 108 kg    Examination:   General exam: Overall comfortable, not in distress,obese HEENT: PERRL Respiratory system:  no wheezes or crackles  Cardiovascular system: S1 & S2 heard, RRR.  Gastrointestinal system: Abdomen is nondistended, soft and nontender. Central nervous system: Alert and mostly oriented Extremities: No edema, no clubbing ,no cyanosis, left knee surgical wound, bruised right toe Skin: No rashes, no ulcers,no icterus       Data Reviewed: I have personally reviewed following labs and imaging studies  CBC: Recent Labs  Lab 04/03/23 1513 04/04/23 0431 04/06/23 0327 04/06/23 1214 04/07/23 0518 04/08/23 1819  04/09/23 0330 04/10/23 0945  WBC 14.3*   < > 24.4*  --  25.1* 36.0* 43.5* 44.4*  NEUTROABS 12.2*  --   --   --   --   --   --   --   HGB 9.6*   < > 7.4* 6.6* 10.1* 10.5* 10.8* 9.9*  HCT 31.9*   < > 23.7* 21.6* 30.6* 34.0* 35.0* 31.7*  MCV 89.4   < > 89.8  --  86.4 88.3 87.9 87.3  PLT 273   < > 245  --  152 192 159 162   < > = values in this interval not displayed.   Basic Metabolic Panel: Recent Labs  Lab 04/04/23 0431 04/04/23 1105 04/05/23 0401 04/06/23 0327 04/06/23 1214 04/07/23 0518 04/08/23 0519 04/09/23 0330  NA 134*  --  132* 131*  --  133* 134* 138  K 5.3*   < > 4.6 5.3* 4.9 4.4 4.2 3.6  CL 102  --  99 99  --  102 100 99  CO2 24  --  24 22  --  18* 24 23  GLUCOSE 117*  --  107* 131*  --  101* 110* 101*  BUN 26*  --  34* 43*  --  54* 62* 71*  CREATININE 1.54*  --  1.49* 1.68*  --  1.41* 1.39* 1.59*  CALCIUM 8.2*  --  8.1* 8.3*  --  8.1* 8.0* 8.1*  MG 1.4*  --  2.0  --   --  1.8  --   --   PHOS 5.3*  --  4.0  --   --  5.3*  --   --    <  > = values in this interval not displayed.     Recent Results (from the past 240 hour(s))  Blood culture (routine x 2)     Status: None   Collection Time: 04/03/23  6:22 PM   Specimen: BLOOD  Result Value Ref Range Status   Specimen Description   Final    BLOOD LEFT ANTECUBITAL Performed at [facility], [address] [place], [place] [address]    Special Requests   Final    BOTTLES DRAWN AEROBIC AND ANAEROBIC Blood Culture adequate volume Performed at [facility], [address] [place], [place] [address]    Culture   Final    NO GROWTH 5 DAYS Performed at [facility], [address], [place], [place] [address]    Report Status 04/08/2023 FINAL  Final  Blood culture (routine x 2)     Status: None   Collection Time: 04/03/23  6:37 PM   Specimen: BLOOD  Result Value Ref Range Status   Specimen Description   Final    BLOOD RIGHT ANTECUBITAL Performed at [facility], [address], [place], [place] [address]    Special Requests   Final    BOTTLES DRAWN AEROBIC AND ANAEROBIC Blood Culture adequate volume Performed at [facility], [address], [place], [place] [address]    Culture   Final    NO GROWTH 5 DAYS Performed at [facility], [address] [place], [place] [address]    Report Status 04/08/2023 FINAL  Final  Surgical pcr screen     Status: None   Collection Time: 04/05/23  8:06 AM   Specimen: Nasal Mucosa; Nasal Swab  Result Value Ref Range Status   MRSA, PCR NEGATIVE NEGATIVE Final   Staphylococcus  aureus NEGATIVE NEGATIVE Final    Comment: (NOTE) The Xpert SA Assay (FDA approved for NASAL specimens in patients 14 years of age and older), is one component of a comprehensive surveillance program. It is not intended to diagnose infection nor to guide or monitor treatment. Performed at [facility], [address], [place], [place] [address]   Urine Culture (for pregnant, neutropenic  or urologic patients or patients with an indwelling urinary catheter)     Status: Abnormal   Collection Time: 04/07/23  9:58 AM   Specimen: Urine, Clean Catch  Result Value Ref Range Status   Specimen Description URINE, CLEAN CATCH  Final   Special Requests NONE  Final   Culture (A)  Final    <10,000 COLONIES/mL INSIGNIFICANT GROWTH Performed at [facility], [address] [place], [place] [address]    Report Status 04/08/2023 FINAL  Final  C Difficile Quick Screen (NO PCR Reflex)     Status: Abnormal   Collection Time: 04/09/23  9:31 AM   Specimen: STOOL  Result Value Ref Range Status   C Diff antigen POSITIVE (A) NEGATIVE Final   C Diff toxin POSITIVE (A) NEGATIVE Final   C Diff interpretation Toxin producing C. difficile detected.  Final    Comment: Performed at [facility], [address], [place], [place] [address]  Culture, blood (Routine X 2) w Reflex to ID Panel     Status: None (Preliminary result)   Collection Time: 04/09/23  3:44 PM   Specimen: BLOOD LEFT ARM  Result Value Ref Range Status   Specimen Description BLOOD LEFT ARM  Final   Special Requests   Final    BOTTLES DRAWN AEROBIC AND ANAEROBIC Blood Culture adequate volume   Culture   Final    NO GROWTH < 24 HOURS Performed at [facility], [address], [place], [place] [address]    Report Status PENDING  Incomplete  Culture, blood (Routine X 2) w Reflex to ID Panel     Status: None (Preliminary result)   Collection Time: 04/09/23  3:51 PM   Specimen: BLOOD RIGHT HAND  Result Value Ref Range Status   Specimen Description BLOOD RIGHT HAND  Final   Special Requests   Final    BOTTLES DRAWN AEROBIC ONLY Blood Culture results may not be optimal due to an inadequate volume of blood received in culture bottles   Culture   Final    NO GROWTH < 24 HOURS Performed at [facility], [address] [place], [place] [address]    Report Status PENDING  Incomplete     Radiology Studies: DG  Toe Great Right  Result Date: 04/09/2023 CLINICAL DATA:  Right great toe pain. EXAM: RIGHT GREAT TOE COMPARISON:  Right foot radiographs-03/26/2023 FINDINGS: Osteopenia. There is a potential minimally displaced obliquely oriented fracture involving the base of the distal phalanx of the great toe with associated geographic osteopenia involving the lateral aspect of the great toe. This finding is associated with adjacent soft tissue swelling. No subcutaneous emphysema. No radiopaque foreign body. Mild degenerative change of the first MTP joint with joint space loss, subchondral sclerosis and osteophytosis. No significant hallux valgus deformity. Adjacent joint spaces appear preserved given obliquity and field of view. IMPRESSION: Potential minimally displaced obliquely oriented fracture involving the base of the distal phalanx of the great toe with associated geographic osteopenia involving the lateral aspect of the great toe. While this is presumably the sequela  of subacute injury, underlying infection is not excluded on the basis of this examination. Clinical correlation is advised. Further evaluation with MRI could be performed as indicated Electronically Signed   By: Simonne Come M.D.   On: 04/09/2023 10:13    Scheduled Meds:  sodium chloride   Intravenous Once   sodium chloride   Intravenous Once   acetaminophen  1,000 mg Oral Q6H   amLODipine  5 mg Oral Daily   apixaban  2.5 mg Oral BID   fidaxomicin  200 mg Oral BID   gabapentin  300 mg Oral q morning   gabapentin  600 mg Oral Q supper   Gerhardt's butt cream   Topical TID   melatonin  5 mg Oral QHS   Vitamin D (Ergocalciferol)  50,000 Units Oral Q7 days   Continuous Infusions:  sodium chloride       LOS: 7 days   Burnadette Pop, MD [facility] P10/11/2022, 10:32 AM

## 2023-04-10 NOTE — Plan of Care (Signed)
  Problem: Education: Goal: Knowledge of General Education information will improve Description: Including pain rating scale, medication(s)/side effects and non-pharmacologic comfort measures Outcome: Progressing   Problem: Clinical Measurements: Goal: Will remain free from infection Outcome: Progressing   Problem: Activity: Goal: Risk for activity intolerance will decrease Outcome: Progressing   Problem: Nutrition: Goal: Adequate nutrition will be maintained Outcome: Progressing   Problem: Coping: Goal: Level of anxiety will decrease Outcome: Progressing   Problem: Skin Integrity: Goal: Risk for impaired skin integrity will decrease Outcome: Progressing

## 2023-04-10 NOTE — Progress Notes (Signed)
[facility] ROUNDING NOTE   Subjective: Patient denies any abdominal pain, nausea, vomiting.  He is not a good historian, and cannot tell me if she has had a bowel movement today or not.  She is oriented to place and date, but she needed prompting in order to tell me why she was in the hospital.   Objective: Vital signs in last 24 hours: Temp:  [97.7 F (36.5 C)-98 F (36.7 C)] 97.9 F (36.6 C) (10/05 0753) Pulse Rate:  [76-92] 76 (10/05 0753) Resp:  [16-18] 16 (10/05 0753) BP: (125-151)/(49-74) 138/66 (10/05 0753) SpO2:  [94 %-97 %] 96 % (10/05 0753) Last BM Date : 04/09/23 General: NAD, obese Caucasian female Lungs:  CTA b/l, no w/r/r Heart:  RRR, no m/r/g Abdomen:  Soft, NT, ND, +BS    Intake/Output from previous day: No intake/output data recorded. Intake/Output this shift: No intake/output data recorded.   Lab Results: Recent Labs    04/08/23 1819 04/09/23 0330 04/10/23 0945  WBC 36.0* 43.5* 44.4*  HGB 10.5* 10.8* 9.9*  PLT 192 159 162  MCV 88.3 87.9 87.3   BMET Recent Labs    04/08/23 0519 04/09/23 0330 04/10/23 0945  NA 134* 138 133*  K 4.2 3.6 3.6  CL 100 99 97*  CO2 24 23 25   GLUCOSE 110* 101* 107*  BUN 62* 71* 81*  CREATININE 1.39* 1.59* 1.54*  CALCIUM 8.0* 8.1* 8.0*   LFT No results for input(s): "PROT", "ALBUMIN", "AST", "ALT", "ALKPHOS", "BILITOT", "BILIDIR", "IBILI" in the last 72 hours. PT/INR No results for input(s): "INR" in the last 72 hours.    Imaging/Other results: DG Toe Great Right  Result Date: 04/09/2023 CLINICAL DATA:  Right great toe pain. EXAM: RIGHT GREAT TOE COMPARISON:  Right foot radiographs-03/26/2023 FINDINGS: Osteopenia. There is a potential minimally displaced obliquely oriented fracture involving the base of the distal phalanx of the great toe with associated geographic osteopenia involving the lateral aspect of the great toe. This finding is associated with adjacent soft tissue swelling. No  subcutaneous emphysema. No radiopaque foreign body. Mild degenerative change of the first MTP joint with joint space loss, subchondral sclerosis and osteophytosis. No significant hallux valgus deformity. Adjacent joint spaces appear preserved given obliquity and field of view. IMPRESSION: Potential minimally displaced obliquely oriented fracture involving the base of the distal phalanx of the great toe with associated geographic osteopenia involving the lateral aspect of the great toe. While this is presumably the sequela of subacute injury, underlying infection is not excluded on the basis of this examination. Clinical correlation is advised. Further evaluation with MRI could be performed as indicated Electronically Signed   By: Simonne Come M.D.   On: 04/09/2023 10:13      Assessment and Plan:  88 year old female with history of morbid obesity, diastolic CHF, admitted following a mechanical fall with femur fracture status post ORIF, found to have colon wall thickening, initially attributed to diverticulitis, but subsequently found to have positive C. difficile. Patient's colitis can be attributed to C. difficile infection alone.  Very unlikely she has also concomitant diverticulitis.  Although she was unable to tell me the number of bowel movements she has had, hospitalist note indicates it is improving.  Her abdominal exam is very reassuring.  Very low concern for fulminant C. difficile.  She has a very high leukocytosis, but this may be multifactorial.  Hopefully, it will start to come down with appropriate treatment of C. difficile.  At this point, would not recommend any further  treatment for C. difficile.  Anticipate some clinical improvement over the next few days.  If the patient's colitis appears to worsen (worsening abdominal pain, nausea/vomiting, fevers, sepsis) please contact GI to consider other changes in therapy.  C. difficile colitis - Continue fidaxomicin 200 mg twice a day for total  of 10 days -Continue current diet - GI will sign off for now.  Please contact us with the patient is not showing improvement on fidaxomicin - Do not recommend repeating C. difficile testing as long as the patient is improving clinically    Jenel Lucks, MD  04/10/2023, 12:22 PM [facility]

## 2023-04-11 DIAGNOSIS — A0472 Enterocolitis due to Clostridium difficile, not specified as recurrent: Secondary | ICD-10-CM | POA: Diagnosis not present

## 2023-04-11 DIAGNOSIS — S72409A Unspecified fracture of lower end of unspecified femur, initial encounter for closed fracture: Secondary | ICD-10-CM | POA: Diagnosis not present

## 2023-04-11 LAB — CBC
HCT: 31.4 % — ABNORMAL LOW (ref 36.0–46.0)
Hemoglobin: 10 g/dL — ABNORMAL LOW (ref 12.0–15.0)
MCH: 28.2 pg (ref 26.0–34.0)
MCHC: 31.8 g/dL (ref 30.0–36.0)
MCV: 88.5 fL (ref 80.0–100.0)
Platelets: 167 10*3/uL (ref 150–400)
RBC: 3.55 MIL/uL — ABNORMAL LOW (ref 3.87–5.11)
RDW: 21.5 % — ABNORMAL HIGH (ref 11.5–15.5)
WBC: 47.8 10*3/uL — ABNORMAL HIGH (ref 4.0–10.5)
nRBC: 0.2 % (ref 0.0–0.2)

## 2023-04-11 NOTE — Plan of Care (Signed)
?  Problem: Elimination: ?Goal: Will not experience complications related to bowel motility ?Outcome: Progressing ?  ?Problem: Pain Managment: ?Goal: General experience of comfort will improve ?Outcome: Progressing ?  ?Problem: Safety: ?Goal: Ability to remain free from injury will improve ?Outcome: Progressing ?  ?

## 2023-04-11 NOTE — Consult Note (Signed)
[facility]    Date of Admission:  04/03/2023     Reason for Consult: c diff colitis    Referring Provider: Renford Dills       Abx: 10/4-c fidaxomicin  10/2-4 piptazo 09/28-2 ampsulb         Assessment: 88 yo female HFpEF, morbid obesity, femur fx s/p orif 9/30, course complicated by cdiff colitis  Per chart has had diarrhea prior to admission which she took antimotility agent at home. Serial ct this admission showed colonic wall thickening diffusely and 10/4 cdiff testing done positive with toxin  Received bsAbx this admission as well  Unclear if cdiff present on admission or complicated by bsAbx   Her wbc is rising but only has had 2 days of appropriate therapy. Clinically diarrhea better and no sign of severe sepsis or bowel distension to suggest toxic megacolon. afebrile    Plan: Continue fidaxomicin 10 day course Will continue to monitor for cdiff complication Discussed with primary team      ------------------------------------------------ Principal Problem:   Closed fracture of distal end of femur, unspecified fracture morphology, initial encounter (HCC) Active Problems:   Essential hypertension   Chronic diastolic CHF (congestive heart failure) (HCC)   Acute diarrhea   Anemia of chronic disease   Acute diverticulitis   Leukocytosis   Chronic kidney disease, stage 3b (HCC)   Recurrent deep vein thrombosis (DVT) (HCC)   C. difficile colitis   Infectious diarrhea    HPI: Chelsea Keller is a 88 y.o. female HFpEF, morbid obesity, femur fx s/p orif 9/30, course complicated by cdiff colitis  Hx via patient, chart/sign out  Patient lost balance the day of admission and had a mechanical fall in the bathroom, then with left hip pain and can't weight bear  She came to ed on 9/28 Ct chest abd pelv unremarkable outside of pericolonic edema distal descending colon. She did have some diarrhea prior to admission but no  fever, chill Xray of the left knee showed comminuted fx of distal femoral metaphysis  Reviewed operative note 9/30 Open reduction internal fixation of left distal femur fracture   She received empiric bs abx for her ct colitis finding  Wbc 14 on admission and continues to steadily rise. 10/4 cdiff testing done and was positive 10/4 repeat abd pelv ct showed ascending involvement of colonic wall inflammation Patient started on fidaxomycin 10/4   She feels weak overall at this time and has ache all over, her left knee doesn't feel any worse than her body Has had 2 large liquid stool since last night  Afebrile  Hds  Eating    Family History  Problem Relation Age of Onset   Depression Mother    Cancer Mother        uterine cancer   Heart attack Father    Cancer Brother        prostate cancer   Diabetes Maternal Aunt    Arthritis Brother    Asthma Brother    Stroke Maternal Grandmother    Pulmonary embolism Daughter     Social History   Tobacco Use   Smoking status: Former    Current packs/day: 0.00    Average packs/day: 1 pack/day for 40.0 years (40.0 ttl pk-yrs)    Types: Cigarettes    Start date: 07/06/1953    Quit date: 07/06/1993    Years since quitting: 29.7    Passive exposure: Past   Smokeless tobacco: Never  Vaping Use   Vaping status: Never Used  Substance Use Topics   Alcohol use: No    Alcohol/week: 0.0 standard drinks of alcohol   Drug use: No    Allergies  Allergen Reactions   Codeine Sulfate Shortness Of Breath   Celecoxib Other (See Comments)    Caused vaginal bleeding   Erythromycin Base Nausea And Vomiting   Spironolactone Nausea Only and Other (See Comments)    Blurred vision, Upset stomach, lethargy    Review of Systems: ROS All Other ROS was negative, except mentioned above   Past Medical History:  Diagnosis Date   Allergy    Anemia    Anxiety    Arthritis    Arthritis of sacroiliac joint of both sides 11/12/2017   Bilateral  pulmonary embolism (HCC) 06/23/2017   Chronic diastolic CHF (congestive heart failure) (HCC)    a. Echo 1/16:  mild LVH, EF normal, grade 1 DD, MAC   Chronic venous insufficiency    chronic LE edema   DDD (degenerative disc disease), lumbar 11/12/2017   Degenerative joint disease (DJD) of hip, Bilateral  11/12/2017   Depression    Fibromyalgia    constant pain   Hx of cardiac catheterization    a. LHC in [place] "ok" per patient with mild plaque in a single vessel - records not available   Hx of cardiovascular stress test    a. Nuclear study in 2008 normal   Hx of colonic polyps    Hypertension    Hypertriglyceridemia    Impaired fasting glucose    PONV (postoperative nausea and vomiting)    PUD (peptic ulcer disease)    hx of gastric ulcer   Pulmonary emboli (HCC) 06/2017   Vitamin B12 deficiency        Scheduled Meds:  sodium chloride   Intravenous Once   sodium chloride   Intravenous Once   acetaminophen  1,000 mg Oral Q6H   amLODipine  5 mg Oral Daily   apixaban  2.5 mg Oral BID   fidaxomicin  200 mg Oral BID   Gerhardt's butt cream   Topical TID   Vitamin D (Ergocalciferol)  50,000 Units Oral Q7 days   Continuous Infusions:  sodium chloride     PRN Meds:.alum & mag hydroxide-simeth, camphor-menthol, ondansetron **OR** ondansetron (ZOFRAN) IV, prochlorperazine   OBJECTIVE: Blood pressure (!) 148/60, pulse 90, temperature 97.6 F (36.4 C), temperature source Oral, resp. rate 19, height 5\' 3"  (1.6 m), weight 108 kg, SpO2 99%.  Physical Exam General/constitutional: no distress, pleasant HEENT: Normocephalic, PER, Conj Clear, EOMI, Oropharynx clear Neck supple CV: rrr no mrg Lungs: clear to auscultation, normal respiratory effort Abd: Soft, Nontender, nondistended Ext: no edema Skin: No Rash Neuro: nonfocal MSK: left femoral dressing intact clean/dry; no swelling/erythema in surrounding    Lab Results Lab Results  Component Value Date   WBC 47.8 (H)  04/11/2023   HGB 10.0 (L) 04/11/2023   HCT 31.4 (L) 04/11/2023   MCV 88.5 04/11/2023   PLT 167 04/11/2023    Lab Results  Component Value Date   CREATININE 1.54 (H) 04/10/2023   BUN 81 (H) 04/10/2023   NA 133 (L) 04/10/2023   K 3.6 04/10/2023   CL 97 (L) 04/10/2023   CO2 25 04/10/2023    Lab Results  Component Value Date   ALT 45 (H) 04/04/2023   AST 63 (H) 04/04/2023   ALKPHOS 164 (H) 04/04/2023   BILITOT 2.4 (H) 04/04/2023  Microbiology: Recent Results (from the past 240 hour(s))  Blood culture (routine x 2)     Status: None   Collection Time: 04/03/23  6:22 PM   Specimen: BLOOD  Result Value Ref Range Status   Specimen Description   Final    BLOOD LEFT ANTECUBITAL Performed at [facility], [address], [place], [place] [address]    Special Requests   Final    BOTTLES DRAWN AEROBIC AND ANAEROBIC Blood Culture adequate volume Performed at [facility], [address], [place], [place] [address]    Culture   Final    NO GROWTH 5 DAYS Performed at [facility], [address] [place], [place] [address]    Report Status 04/08/2023 FINAL  Final  Blood culture (routine x 2)     Status: None   Collection Time: 04/03/23  6:37 PM   Specimen: BLOOD  Result Value Ref Range Status   Specimen Description   Final    BLOOD RIGHT ANTECUBITAL Performed at [facility], [address], [place], [place] [address]    Special Requests   Final    BOTTLES DRAWN AEROBIC AND ANAEROBIC Blood Culture adequate volume Performed at [facility], [address], [place], [place] [address]    Culture   Final    NO GROWTH 5 DAYS Performed at [facility], [address], [place], [place] [address]    Report Status 04/08/2023 FINAL  Final  Surgical pcr screen     Status: None   Collection Time: 04/05/23  8:06 AM   Specimen: Nasal Mucosa; Nasal Swab  Result Value Ref Range Status   MRSA, PCR NEGATIVE NEGATIVE Final    Staphylococcus aureus NEGATIVE NEGATIVE Final    Comment: (NOTE) The Xpert SA Assay (FDA approved for NASAL specimens in patients 14 years of age and older), is one component of a comprehensive surveillance program. It is not intended to diagnose infection nor to guide or monitor treatment. Performed at [facility], [address], [place], [place] [address]   Urine Culture (for pregnant, neutropenic or urologic patients or patients with an indwelling urinary catheter)     Status: Abnormal   Collection Time: 04/07/23  9:58 AM   Specimen: Urine, Clean Catch  Result Value Ref Range Status   Specimen Description URINE, CLEAN CATCH  Final   Special Requests NONE  Final   Culture (A)  Final    <10,000 COLONIES/mL INSIGNIFICANT GROWTH Performed at [facility], [address], [place], [place] [address]    Report Status 04/08/2023 FINAL  Final  C Difficile Quick Screen (NO PCR Reflex)     Status: Abnormal   Collection Time: 04/09/23  9:31 AM   Specimen: STOOL  Result Value Ref Range Status   C Diff antigen POSITIVE (A) NEGATIVE Final   C Diff toxin POSITIVE (A) NEGATIVE Final   C Diff interpretation Toxin producing C. difficile detected.  Final    Comment: Performed at [facility], [address], [place], [place] [address]  Culture, blood (Routine X 2) w Reflex to ID Panel     Status: None (Preliminary result)   Collection Time: 04/09/23  3:44 PM   Specimen: BLOOD LEFT ARM  Result Value Ref Range Status   Specimen Description BLOOD LEFT ARM  Final   Special Requests   Final    BOTTLES DRAWN AEROBIC AND ANAEROBIC Blood Culture adequate volume  Culture   Final    NO GROWTH 2 DAYS Performed at [facility], [address] [place], [place] [address]    Report Status PENDING  Incomplete  Culture, blood (Routine X 2) w Reflex to ID Panel     Status: None (Preliminary result)   Collection Time: 04/09/23  3:51 PM   Specimen: BLOOD RIGHT HAND  Result Value  Ref Range Status   Specimen Description BLOOD RIGHT HAND  Final   Special Requests   Final    BOTTLES DRAWN AEROBIC ONLY Blood Culture results may not be optimal due to an inadequate volume of blood received in culture bottles   Culture   Final    NO GROWTH 2 DAYS Performed at [facility], [address] [place], [place] [address]    Report Status PENDING  Incomplete     Serology:    Imaging: If present, new imagings (plain films, ct scans, and mri) have been personally visualized and interpreted; radiology reports have been reviewed. Decision making incorporated into the Impression / Recommendations.  10/2 ct abd pelv 1. Progressive mild, relatively diffuse colonic inflammation suggesting colitis. 2.  Aortic Atherosclerosis  Raymondo Band, MD [facility] [phone] pager    04/11/2023, 11:01 AM

## 2023-04-11 NOTE — Progress Notes (Signed)
PROGRESS NOTE  Chelsea Keller  BMW:[id number] DOB: [birth year] DOA: 04/03/2023 PCP: [name], MD   Brief Narrative: Patient is a 88 year old female with history of chronic diastolic CHF, hypertension, chronic blood loss anemia requiring blood transfusion, morbid obesity who presented to the emergency department with complaint of fall at home when she lost her balance while coming out of the bathroom.  She was also having 4 to 5 weeks onset of abdominal pain.  Left knee x-ray showed acute comminuted fracture of distal femoral metaphysis at the level of femoral complaint of knee replacement.  Orthopedics consulted, status post ORIF.  CT imaging showed pericolonic inflammation along the descending colon suggesting diverticulitis.  Started antibiotics.  PT/OT recommending SNF.  Hospital course remarkable for persistent leukocytosis.  Follow-up CT imaging on 10/12 showed persistent colitis.  C diff checked on 10/4, found to be positive.  Started on Dificid.  Hospital course remarkable for persistent  leukocytosis, ID consulted  Assessment & Plan:  Principal Problem:   Closed fracture of distal end of femur, unspecified fracture morphology, initial encounter (HCC) Active Problems:   Essential hypertension   Chronic diastolic CHF (congestive heart failure) (HCC)   Acute diarrhea   Anemia of chronic disease   Acute diverticulitis   Leukocytosis   Chronic kidney disease, stage 3b (HCC)   Recurrent deep vein thrombosis (DVT) (HCC)   C. difficile colitis   Infectious diarrhea  Acute commuted fracture of distal femoral metaphysis on the left: Orthopedics consulted, status post ORIF.  PT/OT recommending SNF on discharge.  Continue pain management, supportive care, bowel regimen.  Orthopedics recommending follow-up as an outpatient after 2 weeks for wound check and repeat x-rays. Patient has bruise on the right great toe.  X-ray of the right great toe showed fracture of the base of distal phalanx.   Osteomyelitis is not excluded but patient does not have any ulcer.  At this point, osteomyelitis is less likely.  If there is drainage or formation of ulcer in the future, can do MRI as an outpatient  C. difficile colitis/severe leukocytosis: She was having 4 to 5 weeks history of lower abdominal pain.  CT imaging done on admission showed  pericolonic inflammation along the descending colon suggesting diverticulitis.  Her leukocytosis severely worsened. CT abdomen/pelvis done on 10/2 showed progressive colitis with bowel edema.    Antibiotics changed to Zosyn.  Blood cultures have remained negative so far.  She started having worsening of diarrhea on 10/4.  C. difficile came out to be positive. Started on Dificid.  Zosyn discontinued.  GI also following.  Diarrhea is slowing down.  Denies any abd pain, nausea or vomiting today.  Leukocytosis persist.  Requested ID consultation.  Suspected UTI: Patient was confused, has leukocytosis, UA done on 10/2 shows some leukocytes.  Follow-up urine culture showed insignificant growth.  Patient doesnot have dysuria  Chronic normocytic anemia: Follows with hematology, Dr. Pamelia Hoit  had blood transfusions in the past.  Monitor hemoglobin, currently stable  AKI in CKD stage IIIb: Currently kidney function at baseline.  Avoid nephrotoxins  History of recurrent DVTs: On Eliquis.  Eliquis  restarted   Hypertension: Triamterene-hydrochlorothiazide discontinued.  Monitor blood pressure, started on low-dose amlodipine  Chronic diastolic CHF: Home torsemide on hold due to AKI.  She says this does not take torsemide  anymore at home.  Currently appears euvolemic.  She may need  low-dose Lasix on discharge  Hyperkalemia: Resolved  Vitamin D deficiency: Started supplemented  Confusion/drowsiness:This is hospital acquired delirium. Minimize  sedatives/narcotics.  Gabapentin on hold.  CT head done on 9/28 shows as age related atrophy, chronic microvascular changes             DVT prophylaxis:apixaban (ELIQUIS) tablet 2.5 mg Start: 04/07/23 1000 SCDs Start: 04/06/23 0816 SCDs Start: 04/03/23 2019 apixaban (ELIQUIS) tablet 2.5 mg     Code Status: Full Code  Family Communication: Called and discussed with daughter on phone on 10/6  Patient status:Inpatient  Patient is from :Home  Anticipated discharge to:SNF  Estimated DC date: After improvement in the leukocytosis   Consultants: Orthopedics,GI ,ID  Procedures: ORIF  Antimicrobials:  Anti-infectives (From admission, onward)    Start     Dose/Rate Route Frequency Ordered Stop   04/09/23 1315  fidaxomicin (DIFICID) tablet 200 mg        200 mg Oral 2 times daily 04/09/23 1226 04/19/23 0959   04/07/23 1045  piperacillin-tazobactam (ZOSYN) IVPB 3.375 g  Status:  Discontinued        3.375 g 12.5 mL/hr over 240 Minutes Intravenous Every 8 hours 04/07/23 0952 04/09/23 1226   04/06/23 2200  Ampicillin-Sulbactam (UNASYN) 3 g in sodium chloride 0.9 % 100 mL IVPB  Status:  Discontinued        3 g 200 mL/hr over 30 Minutes Intravenous Every 12 hours 04/06/23 0756 04/07/23 0947   04/05/23 1754  vancomycin (VANCOCIN) powder  Status:  Discontinued          As needed 04/05/23 1754 04/05/23 1808   04/05/23 1545  ceFAZolin (ANCEF) IVPB 2g/100 mL premix  Status:  Discontinued        2 g 200 mL/hr over 30 Minutes Intravenous On call to O.R. 04/05/23 1516 04/05/23 1550   04/05/23 1529  ceFAZolin (ANCEF) 2-4 GM/100ML-% IVPB  Status:  Discontinued       Note to Pharmacy: Shanda Bumps M: cabinet override      04/05/23 1529 04/05/23 1914   04/04/23 2200  Ampicillin-Sulbactam (UNASYN) 3 g in sodium chloride 0.9 % 100 mL IVPB  Status:  Discontinued        3 g 200 mL/hr over 30 Minutes Intravenous Every 8 hours 04/03/23 2025 04/06/23 0756   04/03/23 1800  Ampicillin-Sulbactam (UNASYN) 3 g in sodium chloride 0.9 % 100 mL IVPB        3 g 200 mL/hr over 30 Minutes Intravenous  Once 04/03/23 1747 04/03/23 2100        Subjective: Patient seen and examined the bedside today.  Hemodynamically stable.  Lying in bed.  Appears very drowsy and sleepy this morning.  When asked, she did not complain of any abdominal pain, nausea or vomiting.  As per RN, no loose bowel movement today.  Abdomen is soft, nondistended and nontender with bowel sounds  Objective: Vitals:   04/10/23 1353 04/10/23 2159 04/11/23 0646 04/11/23 0810  BP: (!) 140/70 (!) 131/59 128/60 (!) 148/60  Pulse: (!) 105 95 87 90  Resp: 18 20 20 19   Temp: 97.6 F (36.4 C) 98.1 F (36.7 C) 97.9 F (36.6 C) 97.6 F (36.4 C)  TempSrc: Oral Oral Oral Oral  SpO2: 100% 95% 100% 99%  Weight:      Height:        Intake/Output Summary (Last 24 hours) at 04/11/2023 1059 Last data filed at 04/10/2023 1700 Gross per 24 hour  Intake 480 ml  Output --  Net 480 ml    Filed Weights   04/03/23 1339  Weight: 108 kg    Examination:  General exam: sleepy/drowsy, not in distress,obese HEENT: PERRL Respiratory system:  no wheezes or crackles  Cardiovascular system: S1 & S2 heard, RRR.  Gastrointestinal system: Abdomen is nondistended, soft and nontender. Central nervous system: sleepy,confused this mrng Extremities: No edema, no clubbing ,no cyanosis, left knee surgical wound, bruise tried to Skin: No rashes, no ulcers,no icterus      Data Reviewed: I have personally reviewed following labs and imaging studies  CBC: Recent Labs  Lab 04/07/23 0518 04/08/23 1819 04/09/23 0330 04/10/23 0945 04/11/23 0749  WBC 25.1* 36.0* 43.5* 44.4* 47.8*  HGB 10.1* 10.5* 10.8* 9.9* 10.0*  HCT 30.6* 34.0* 35.0* 31.7* 31.4*  MCV 86.4 88.3 87.9 87.3 88.5  PLT 152 192 159 162 167   Basic Metabolic Panel: Recent Labs  Lab 04/05/23 0401 04/06/23 0327 04/06/23 1214 04/07/23 0518 04/08/23 0519 04/09/23 0330 04/10/23 0945  NA 132* 131*  --  133* 134* 138 133*  K 4.6 5.3* 4.9 4.4 4.2 3.6 3.6  CL 99 99  --  102 100 99 97*  CO2 24 22  --  18* 24  23 25   GLUCOSE 107* 131*  --  101* 110* 101* 107*  BUN 34* 43*  --  54* 62* 71* 81*  CREATININE 1.49* 1.68*  --  1.41* 1.39* 1.59* 1.54*  CALCIUM 8.1* 8.3*  --  8.1* 8.0* 8.1* 8.0*  MG 2.0  --   --  1.8  --   --   --   PHOS 4.0  --   --  5.3*  --   --   --      Recent Results (from the past 240 hour(s))  Blood culture (routine x 2)     Status: None   Collection Time: 04/03/23  6:22 PM   Specimen: BLOOD  Result Value Ref Range Status   Specimen Description   Final    BLOOD LEFT ANTECUBITAL Performed at [facility], [address] [place], [place] [address]    Special Requests   Final    BOTTLES DRAWN AEROBIC AND ANAEROBIC Blood Culture adequate volume Performed at [facility], [address] [place], [place] [address]    Culture   Final    NO GROWTH 5 DAYS Performed at [facility], [address] [place] [address] [place], [place] [address]    Report Status 04/08/2023 FINAL  Final  Blood culture (routine x 2)     Status: None   Collection Time: 04/03/23  6:37 PM   Specimen: BLOOD  Result Value Ref Range Status   Specimen Description   Final    BLOOD RIGHT ANTECUBITAL Performed at [facility], [address] [place], [place] [address]    Special Requests   Final    BOTTLES DRAWN AEROBIC AND ANAEROBIC Blood Culture adequate volume Performed at [facility], [address], [place], [place] [address]    Culture   Final    NO GROWTH 5 DAYS Performed at [facility], [address] [place], [place] [address]    Report Status 04/08/2023 FINAL  Final  Surgical pcr screen     Status: None   Collection Time: 04/05/23  8:06 AM   Specimen: Nasal Mucosa; Nasal Swab  Result Value Ref Range Status   MRSA, PCR NEGATIVE NEGATIVE Final   Staphylococcus aureus NEGATIVE NEGATIVE Final    Comment: (NOTE) The Xpert SA Assay (FDA approved for NASAL specimens in patients 23 years of age and  older), is one component of a  comprehensive surveillance program. It is not intended to diagnose infection nor to guide or monitor treatment. Performed at [facility], [address] [place], [place] [address]   Urine Culture (for pregnant, neutropenic or urologic patients or patients with an indwelling urinary catheter)     Status: Abnormal   Collection Time: 04/07/23  9:58 AM   Specimen: Urine, Clean Catch  Result Value Ref Range Status   Specimen Description URINE, CLEAN CATCH  Final   Special Requests NONE  Final   Culture (A)  Final    <10,000 COLONIES/mL INSIGNIFICANT GROWTH Performed at [facility], [address] [place], [place] [address]    Report Status 04/08/2023 FINAL  Final  C Difficile Quick Screen (NO PCR Reflex)     Status: Abnormal   Collection Time: 04/09/23  9:31 AM   Specimen: STOOL  Result Value Ref Range Status   C Diff antigen POSITIVE (A) NEGATIVE Final   C Diff toxin POSITIVE (A) NEGATIVE Final   C Diff interpretation Toxin producing C. difficile detected.  Final    Comment: Performed at [facility], [address] [place], [place] [address]  Culture, blood (Routine X 2) w Reflex to ID Panel     Status: None (Preliminary result)   Collection Time: 04/09/23  3:44 PM   Specimen: BLOOD LEFT ARM  Result Value Ref Range Status   Specimen Description BLOOD LEFT ARM  Final   Special Requests   Final    BOTTLES DRAWN AEROBIC AND ANAEROBIC Blood Culture adequate volume   Culture   Final    NO GROWTH 2 DAYS Performed at [facility], [address], [place], [place] [address]    Report Status PENDING  Incomplete  Culture, blood (Routine X 2) w Reflex to ID Panel     Status: None (Preliminary result)   Collection Time: 04/09/23  3:51 PM   Specimen: BLOOD RIGHT HAND  Result Value Ref Range Status   Specimen Description BLOOD RIGHT HAND  Final   Special Requests   Final    BOTTLES DRAWN AEROBIC ONLY Blood Culture results may not be optimal due to an inadequate  volume of blood received in culture bottles   Culture   Final    NO GROWTH 2 DAYS Performed at [facility], [address] [place], [place] [address]    Report Status PENDING  Incomplete     Radiology Studies: No results found.  Scheduled Meds:  sodium chloride   Intravenous Once   sodium chloride   Intravenous Once   acetaminophen  1,000 mg Oral Q6H   amLODipine  5 mg Oral Daily   apixaban  2.5 mg Oral BID   fidaxomicin  200 mg Oral BID   Gerhardt's butt cream   Topical TID   Vitamin D (Ergocalciferol)  50,000 Units Oral Q7 days   Continuous Infusions:  sodium chloride       LOS: 8 days   Burnadette Pop, MD [facility] P10/12/2022, 10:59 AM

## 2023-04-12 ENCOUNTER — Encounter: Payer: Self-pay | Admitting: Internal Medicine

## 2023-04-12 ENCOUNTER — Other Ambulatory Visit (HOSPITAL_COMMUNITY): Payer: Self-pay

## 2023-04-12 ENCOUNTER — Encounter (HOSPITAL_COMMUNITY): Payer: Self-pay | Admitting: Student

## 2023-04-12 DIAGNOSIS — S72409A Unspecified fracture of lower end of unspecified femur, initial encounter for closed fracture: Secondary | ICD-10-CM | POA: Diagnosis not present

## 2023-04-12 DIAGNOSIS — D72825 Bandemia: Secondary | ICD-10-CM

## 2023-04-12 LAB — BASIC METABOLIC PANEL
Anion gap: 16 — ABNORMAL HIGH (ref 5–15)
BUN: 90 mg/dL — ABNORMAL HIGH (ref 8–23)
CO2: 19 mmol/L — ABNORMAL LOW (ref 22–32)
Calcium: 8.1 mg/dL — ABNORMAL LOW (ref 8.9–10.3)
Chloride: 98 mmol/L (ref 98–111)
Creatinine, Ser: 1.16 mg/dL — ABNORMAL HIGH (ref 0.44–1.00)
GFR, Estimated: 46 mL/min — ABNORMAL LOW (ref >60)
Glucose, Bld: 98 mg/dL (ref 70–99)
Potassium: 3.8 mmol/L (ref 3.5–5.1)
Sodium: 133 mmol/L — ABNORMAL LOW (ref 135–145)

## 2023-04-12 LAB — C DIFFICILE (CDIFF) QUICK SCRN (NO PCR REFLEX)
C Diff antigen: POSITIVE — AB
C Diff interpretation: DETECTED
C Diff toxin: POSITIVE — AB

## 2023-04-12 LAB — CBC
HCT: 34.6 % — ABNORMAL LOW (ref 36.0–46.0)
Hemoglobin: 10.6 g/dL — ABNORMAL LOW (ref 12.0–15.0)
MCH: 27.5 pg (ref 26.0–34.0)
MCHC: 30.6 g/dL (ref 30.0–36.0)
MCV: 89.6 fL (ref 80.0–100.0)
Platelets: 155 K/uL (ref 150–400)
RBC: 3.86 MIL/uL — ABNORMAL LOW (ref 3.87–5.11)
RDW: 22.1 % — ABNORMAL HIGH (ref 11.5–15.5)
WBC: 48.6 K/uL — ABNORMAL HIGH (ref 4.0–10.5)
nRBC: 0.3 % — ABNORMAL HIGH (ref 0.0–0.2)

## 2023-04-12 NOTE — Progress Notes (Addendum)
Physical Therapy Treatment Patient Details Name: Chelsea Keller MRN: [id number] DOB: July 31, 1934 Today's Date: 04/12/2023   History of Present Illness Pt is 88 y.o. female s/p left ORIF left femur fx sustained from a mechanical fall at home. PMH: chronic diastolic CHF, essential hypertension, chronic blood loss anemia requiring blood transfusion, morbid obesity    PT Comments  Pt incontinent of stool on entry. PT assisted pt with rolling onto R side for pericare with totalAx2. Pt requiring total Ax2 for management of LE off bed and bringing trunk to upright to sit on EoB. Once there pt able to progress to static sitting with only single UE support for ~5 min. While seated PT assisted in ROM exercises. Once returned to supine pt noted to be incontinent of stool again and rolled for pericare before return to supine and sliding up in bed. Request MD order air mattress give presence of sacral pressure injury and inability to reposition herself without total outside assist. D/c plan remains appropriate. PT will continue to follow acutely.     If plan is discharge home, recommend the following: Two people to help with bathing/dressing/bathroom;Two people to help with walking and/or transfers;Assistance with cooking/housework;Assist for transportation   Can travel by private vehicle     No  Equipment Recommendations  Hoyer lift;Wheelchair cushion (measurements PT);Wheelchair (measurements PT);Hospital bed    Recommendations for Other Services       Precautions / Restrictions Precautions Precautions: Fall Precaution Comments: pt with h/o >3 falls with fracture/major injury Restrictions Weight Bearing Restrictions: Yes LLE Weight Bearing: Touchdown weight bearing     Mobility  Bed Mobility Overal bed mobility: Needs Assistance Bed Mobility: Rolling Rolling: Total assist, Used rails, +2 for physical assistance   Supine to sit: Total assist, HOB elevated, Used rails Sit to supine: Total  assist, +2 for physical assistance   General bed mobility comments: rolling for pericare x2, and for placement of clean linens. Pt requires total A at LE and trunk for coming to seated EoB and then again for return to supine    Transfers      Unable to perform without total Lift                         Balance Overall balance assessment: Needs assistance, History of Falls Sitting-balance support: Single extremity supported, Bilateral upper extremity supported, Feet supported, Feet unsupported Sitting balance-Leahy Scale: Poor Sitting balance - Comments: able to sit EOB with feet off floor and single UE support without outside assist for 5+ min                                    Cognition Arousal: Alert Behavior During Therapy: WFL for tasks assessed/performed Overall Cognitive Status: Within Functional Limits for tasks assessed                                          Exercises Total Joint Exercises Ankle Circles/Pumps: AAROM, Both, 10 reps, Seated Long Arc Quad: Both, 5 reps, Seated, AAROM Marching in Standing: AAROM, Both, 5 reps, Seated    General Comments General comments (skin integrity, edema, etc.): Pt with sacral wound      Pertinent Vitals/Pain Pain Assessment Pain Assessment: Faces Faces Pain Scale: Hurts whole lot Pain Location: left leg with movement for  pericare Pain Descriptors / Indicators: Operative site guarding, Grimacing     PT Goals (current goals can now be found in the care plan section) Acute Rehab PT Goals PT Goal Formulation: With patient Time For Goal Achievement: 05-06-23 Potential to Achieve Goals: Fair Progress towards PT goals: Progressing toward goals    Frequency    Min 1X/week       AM-PAC PT "6 Clicks" Mobility   Outcome Measure  Help needed turning from your back to your side while in a flat bed without using bedrails?: Total Help needed moving from lying on your back to sitting  on the side of a flat bed without using bedrails?: Total Help needed moving to and from a bed to a chair (including a wheelchair)?: Total Help needed standing up from a chair using your arms (e.g., wheelchair or bedside chair)?: Total Help needed to walk in hospital room?: Total Help needed climbing 3-5 steps with a railing? : Total 6 Click Score: 6    End of Session   Activity Tolerance: Patient tolerated treatment well Patient left: in bed;with call bell/phone within reach;with chair alarm set Nurse Communication: Mobility status PT Visit Diagnosis: Other abnormalities of gait and mobility (R26.89);Pain Pain - Right/Left: Left Pain - part of body: Leg     Time: 4782-9562 PT Time Calculation (min) (ACUTE ONLY): 24 min  Charges:    $Therapeutic Exercise: 8-22 mins $Therapeutic Activity: 8-22 mins PT General Charges $$ ACUTE PT VISIT: 1 Visit                     Chelsea Keller PT, DPT Acute Rehabilitation Services Please use secure chat or  Call Office [phone]    Elon Alas [facility] 04/12/2023, 11:57 AM

## 2023-04-12 NOTE — Progress Notes (Addendum)
Subjective:  Patient was fairly somnolent when examined her she was eager to go back to sleep she denied having much in the way of bowel movements.    Antibiotics:  Anti-infectives (From admission, onward)    Start     Dose/Rate Route Frequency Ordered Stop   04/09/23 1315  fidaxomicin (DIFICID) tablet 200 mg        200 mg Oral 2 times daily 04/09/23 1226 04/19/23 0959   04/07/23 1045  piperacillin-tazobactam (ZOSYN) IVPB 3.375 g  Status:  Discontinued        3.375 g 12.5 mL/hr over 240 Minutes Intravenous Every 8 hours 04/07/23 0952 04/09/23 1226   04/06/23 2200  Ampicillin-Sulbactam (UNASYN) 3 g in sodium chloride 0.9 % 100 mL IVPB  Status:  Discontinued        3 g 200 mL/hr over 30 Minutes Intravenous Every 12 hours 04/06/23 0756 04/07/23 0947   04/05/23 1754  vancomycin (VANCOCIN) powder  Status:  Discontinued          As needed 04/05/23 1754 04/05/23 1808   04/05/23 1545  ceFAZolin (ANCEF) IVPB 2g/100 mL premix  Status:  Discontinued        2 g 200 mL/hr over 30 Minutes Intravenous On call to O.R. 04/05/23 1516 04/05/23 1550   04/05/23 1529  ceFAZolin (ANCEF) 2-4 GM/100ML-% IVPB  Status:  Discontinued       Note to Pharmacy: Shanda Bumps M: cabinet override      04/05/23 1529 04/05/23 1914   04/04/23 2200  Ampicillin-Sulbactam (UNASYN) 3 g in sodium chloride 0.9 % 100 mL IVPB  Status:  Discontinued        3 g 200 mL/hr over 30 Minutes Intravenous Every 8 hours 04/03/23 2025 04/06/23 0756   04/03/23 1800  Ampicillin-Sulbactam (UNASYN) 3 g in sodium chloride 0.9 % 100 mL IVPB        3 g 200 mL/hr over 30 Minutes Intravenous  Once 04/03/23 1747 04/03/23 2100       Medications: Scheduled Meds:  sodium chloride   Intravenous Once   sodium chloride   Intravenous Once   acetaminophen  1,000 mg Oral Q6H   amLODipine  5 mg Oral Daily   apixaban  2.5 mg Oral BID   fidaxomicin  200 mg Oral BID   Gerhardt's butt cream   Topical TID   Vitamin D (Ergocalciferol)   50,000 Units Oral Q7 days   Continuous Infusions: PRN Meds:.alum & mag hydroxide-simeth, camphor-menthol, ondansetron **OR** ondansetron (ZOFRAN) IV, prochlorperazine    Objective: Weight change:   Intake/Output Summary (Last 24 hours) at 04/12/2023 1917 Last data filed at 04/12/2023 0730 Gross per 24 hour  Intake 240 ml  Output --  Net 240 ml   Blood pressure 128/66, pulse (!) 58, temperature 97.6 F (36.4 C), temperature source Oral, resp. rate 18, height 5\' 3"  (1.6 m), weight 108 kg, SpO2 98%. Temp:  [97.6 F (36.4 C)-97.9 F (36.6 C)] 97.6 F (36.4 C) (10/07 0731) Pulse Rate:  [58-94] 58 (10/07 0731) Resp:  [18] 18 (10/07 0731) BP: (128-153)/(66-73) 128/66 (10/07 0731) SpO2:  [97 %-99 %] 98 % (10/07 0731)  Physical Exam: Physical Exam Constitutional:      General: She is not in acute distress.    Appearance: She is well-developed. She is not diaphoretic.  HENT:     Head: Normocephalic and atraumatic.     Right Ear: External ear normal.     Left Ear: External  ear normal.     Mouth/Throat:     Pharynx: No oropharyngeal exudate.  Eyes:     General: No scleral icterus.    Conjunctiva/sclera: Conjunctivae normal.     Pupils: Pupils are equal, round, and reactive to light.  Cardiovascular:     Rate and Rhythm: Normal rate and regular rhythm.  Pulmonary:     Effort: Pulmonary effort is normal. No respiratory distress.     Breath sounds: Normal breath sounds. No wheezing.  Abdominal:     General: Bowel sounds are normal. There is no distension.     Palpations: Abdomen is soft.     Tenderness: There is no abdominal tenderness.  Musculoskeletal:        General: No tenderness. Normal range of motion.  Lymphadenopathy:     Cervical: No cervical adenopathy.  Skin:    General: Skin is warm and dry.     Coloration: Skin is not pale.     Findings: No erythema or rash.  Neurological:     General: No focal deficit present.     Mental Status: She is alert and oriented to  person, place, and time.     Motor: No abnormal muscle tone.  Psychiatric:        Attention and Perception: She is inattentive.        Speech: Speech is delayed.        Behavior: Behavior normal.        Thought Content: Thought content normal.        Judgment: Judgment normal.      CBC:    BMET Recent Labs    04/10/23 0945 04/12/23 0519  NA 133* 133*  K 3.6 3.8  CL 97* 98  CO2 25 19*  GLUCOSE 107* 98  BUN 81* 90*  CREATININE 1.54* 1.16*  CALCIUM 8.0* 8.1*     Liver Panel  No results for input(s): "PROT", "ALBUMIN", "AST", "ALT", "ALKPHOS", "BILITOT", "BILIDIR", "IBILI" in the last 72 hours.     Sedimentation Rate No results for input(s): "ESRSEDRATE" in the last 72 hours. C-Reactive Protein No results for input(s): "CRP" in the last 72 hours.  Micro Results: Recent Results (from the past 720 hour(s))  Urine Culture     Status: Abnormal   Collection Time: 03/15/23  2:34 PM   Specimen: Urine  Result Value Ref Range Status   MICRO NUMBER: [id number]  Final   SPECIMEN QUALITY: Adequate  Final   Sample Source URINE  Final   STATUS: FINAL  Final   ISOLATE 1: Citrobacter freundii (A)  Final    Comment: Greater than 100,000 CFU/mL of Citrobacter freundii      Susceptibility   Citrobacter freundii - URINE CULTURE, REFLEX    AMOX/CLAVULANIC  Resistant     CEFAZOLIN* 32 Resistant      * For uncomplicated UTI caused by E. coli, K. pneumoniae or P. mirabilis: Cefazolin is susceptible if MIC <32 mcg/mL and predicts susceptible to the oral agents cefaclor, cefdinir, cefpodoxime, cefprozil, cefuroxime, cephalexin and loracarbef.     CEFTAZIDIME <=1 Sensitive     CEFEPIME <=1 Sensitive     CEFTRIAXONE <=1 Sensitive     CIPROFLOXACIN <=0.25 Sensitive     LEVOFLOXACIN <=0.12 Sensitive     GENTAMICIN <=1 Sensitive     IMIPENEM 0.5 Sensitive     NITROFURANTOIN <=16 Sensitive     PIP/TAZO <=4 Sensitive     TOBRAMYCIN <=1 Sensitive     TRIMETH/SULFA* <=20 Sensitive       *  For uncomplicated UTI caused by E. coli, K. pneumoniae or P. mirabilis: Cefazolin is susceptible if MIC <32 mcg/mL and predicts susceptible to the oral agents cefaclor, cefdinir, cefpodoxime, cefprozil, cefuroxime, cephalexin and loracarbef. Legend: S = Susceptible  I = Intermediate R = Resistant  NS = Not susceptible SDD = Susceptible Dose Dependent * = Not Tested  NR = Not Reported **NN = See Therapy Comments   Blood culture (routine x 2)     Status: None   Collection Time: 04/03/23  6:22 PM   Specimen: BLOOD  Result Value Ref Range Status   Specimen Description   Final    BLOOD LEFT ANTECUBITAL Performed at [facility], [address], [place], [place] [address]    Special Requests   Final    BOTTLES DRAWN AEROBIC AND ANAEROBIC Blood Culture adequate volume Performed at [facility], [address] [place], [place] [address]    Culture   Final    NO GROWTH 5 DAYS Performed at [facility], [address], [place], [place] [address]    Report Status 04/08/2023 FINAL  Final  Blood culture (routine x 2)     Status: None   Collection Time: 04/03/23  6:37 PM   Specimen: BLOOD  Result Value Ref Range Status   Specimen Description   Final    BLOOD RIGHT ANTECUBITAL Performed at [facility], [address] [place], [place] [address]    Special Requests   Final    BOTTLES DRAWN AEROBIC AND ANAEROBIC Blood Culture adequate volume Performed at [facility], [address], [place], [place] [address]    Culture   Final    NO GROWTH 5 DAYS Performed at [facility], [address], [place], [place] [address]    Report Status 04/08/2023 FINAL  Final  Surgical pcr screen     Status: None   Collection Time: 04/05/23  8:06 AM   Specimen: Nasal Mucosa; Nasal Swab  Result Value Ref Range Status   MRSA, PCR NEGATIVE NEGATIVE Final   Staphylococcus aureus NEGATIVE NEGATIVE Final    Comment: (NOTE) The Xpert  SA Assay (FDA approved for NASAL specimens in patients 6 years of age and older), is one component of a comprehensive surveillance program. It is not intended to diagnose infection nor to guide or monitor treatment. Performed at [facility], [address], [place], [place] [address]   Urine Culture (for pregnant, neutropenic or urologic patients or patients with an indwelling urinary catheter)     Status: Abnormal   Collection Time: 04/07/23  9:58 AM   Specimen: Urine, Clean Catch  Result Value Ref Range Status   Specimen Description URINE, CLEAN CATCH  Final   Special Requests NONE  Final   Culture (A)  Final    <10,000 COLONIES/mL INSIGNIFICANT GROWTH Performed at [facility], [address] [place], [place] [address]    Report Status 04/08/2023 FINAL  Final  C Difficile Quick Screen (NO PCR Reflex)     Status: Abnormal   Collection Time: 04/09/23  9:31 AM   Specimen: STOOL  Result Value Ref Range Status   C Diff antigen POSITIVE (A) NEGATIVE Final   C Diff toxin POSITIVE (A) NEGATIVE Final   C Diff interpretation Toxin producing C. difficile detected.  Final    Comment: CRITICAL RESULT CALLED TO, READ BACK BY AND VERIFIED WITH: RN CHRIS E 1040 [id number] FCP Performed at  [facility], [address], [place], [place] [address]   Culture, blood (Routine X 2) w Reflex to ID Panel     Status: None (Preliminary result)   Collection Time: 04/09/23  3:44 PM   Specimen: BLOOD LEFT ARM  Result Value Ref Range Status   Specimen Description BLOOD LEFT ARM  Final   Special Requests   Final    BOTTLES DRAWN AEROBIC AND ANAEROBIC Blood Culture adequate volume   Culture   Final    NO GROWTH 3 DAYS Performed at [facility], [address], [place], [place] [address]    Report Status PENDING  Incomplete  Culture, blood (Routine X 2) w Reflex to ID Panel     Status: None (Preliminary result)   Collection Time: 04/09/23  3:51 PM   Specimen: BLOOD RIGHT HAND   Result Value Ref Range Status   Specimen Description BLOOD RIGHT HAND  Final   Special Requests   Final    BOTTLES DRAWN AEROBIC ONLY Blood Culture results may not be optimal due to an inadequate volume of blood received in culture bottles   Culture   Final    NO GROWTH 3 DAYS Performed at [facility], [address], [place], [place] [address]    Report Status PENDING  Incomplete    Studies/Results: No results found.    Assessment/Plan:  INTERVAL HISTORY: pt somnolent this am   Principal Problem:   Closed fracture of distal end of femur, unspecified fracture morphology, initial encounter (HCC) Active Problems:   Essential hypertension   Chronic diastolic CHF (congestive heart failure) (HCC)   Acute diarrhea   Anemia of chronic disease   Acute diverticulitis   Leukocytosis   Chronic kidney disease, stage 3b (HCC)   Recurrent deep vein thrombosis (DVT) (HCC)   C. difficile colitis   Infectious diarrhea    Chelsea Keller is a 88 y.o. female with  HFpEF, morbid obesity, femur fx s/p orif 9/30, course complicated by cdiff colitis   #1  C. difficile colitis:  She was having diarrhea apparently prior to admission and 1 C. difficile test was ordered but canceled.  I do not think we have good way of arguing out of the fact that this might be a hospital-acquired C. difficile case.  Will plan on continuing Dificid and using her benefits to send her home with 10 days of Dificid at discharge.  Avoid unnecessary antibiotics avoid proton pump inhibitors  IT WILL BE IMPORTANT TO BE PATIENT WHEN FOLLOWING HER WBC she has not been on effective therapy until the 3rd.  I have personally spent 36 minutes involved in face-to-face and non-face-to-face activities for this patient on the day of the visit. Professional time spent includes the following activities: Preparing to see the patient (review of tests), Obtaining and/or reviewing separately obtained history  (admission/discharge record), Performing a medically appropriate examination and/or evaluation , Ordering medications/tests/procedures, referring and communicating with other health care professionals, Documenting clinical information in the EMR, Independently interpreting results (not separately reported), Communicating results to the patient/family/caregiver, Counseling and educating the patient/family/caregiver and Care coordination (not separately reported).   I will sign off for now please call with further questions.    LOS: 9 days   Acey Lav 04/12/2023, 7:17 PM

## 2023-04-12 NOTE — Plan of Care (Signed)

## 2023-04-12 NOTE — Progress Notes (Signed)
Patient had 2 small greenish black BM over night, 3rd incision at left lateral thigh had  moderate drainage, changed 2, 3x3 inch melpilex dressing.

## 2023-04-12 NOTE — Progress Notes (Addendum)
PROGRESS NOTE  Chelsea Keller  ZOX:[id number] DOB: June 16, 1935 DOA: 04/03/2023 PCP: [name], MD   Brief Narrative: Patient is a 88 year old female with history of chronic diastolic CHF, hypertension, chronic blood loss anemia requiring blood transfusion, morbid obesity who presented to the emergency department with complaint of fall at home when she lost her balance while coming out of the bathroom.  She was also having 4 to 5 weeks onset of abdominal pain.  Left knee x-ray showed acute comminuted fracture of distal femoral metaphysis at the level of femoral complaint of knee replacement.  Orthopedics consulted, status post ORIF.  CT imaging showed pericolonic inflammation along the descending colon suggesting diverticulitis.  Started antibiotics.  PT/OT recommending SNF.  Hospital course remarkable for persistent leukocytosis.  Follow-up CT imaging on 10/12 showed persistent colitis.  C diff checked on 10/4, found to be positive.  Started on Dificid.  Hospital course remarkable for persistent  leukocytosis, ID also consulted  Assessment & Plan:  Principal Problem:   Closed fracture of distal end of femur, unspecified fracture morphology, initial encounter (HCC) Active Problems:   Essential hypertension   Chronic diastolic CHF (congestive heart failure) (HCC)   Acute diarrhea   Anemia of chronic disease   Acute diverticulitis   Leukocytosis   Chronic kidney disease, stage 3b (HCC)   Recurrent deep vein thrombosis (DVT) (HCC)   C. difficile colitis   Infectious diarrhea  Acute commuted fracture of distal femoral metaphysis on the left: Orthopedics consulted, status post ORIF.  PT/OT recommending SNF on discharge.  Continue pain management, supportive care, bowel regimen.  Orthopedics recommending follow-up as an outpatient after 2 weeks for wound check and repeat x-rays. Patient has bruise on the right great toe.  X-ray of the right great toe showed fracture of the base of distal  phalanx.  As per imaging,osteomyelitis is not excluded but patient does not have any ulcer.  At this point, osteomyelitis is less likely.  If there is drainage or formation of ulcer in the future, can do MRI as an outpatient  C. difficile colitis/severe leukocytosis: She was having 4 to 5 weeks history of lower abdominal pain.  CT imaging done on admission showed  pericolonic inflammation along the descending colon suggesting diverticulitis.  Her leukocytosis severely worsened. CT abdomen/pelvis done on 10/2 showed progressive colitis with bowel edema.    Antibiotics changed to Zosyn.  Blood cultures have remained negative so far.  She started having worsening of diarrhea on 10/4.  C. difficile came out to be positive. Started on Dificid.  Zosyn discontinued.  GI also following.  Diarrhea is slowing down.  Denies any abd pain, nausea or vomiting today.  Leukocytosis persist.  Requested ID consultation.  Chronic normocytic anemia: Follows with hematology, Dr. Pamelia Hoit  had blood transfusions in the past.  Monitor hemoglobin, currently stable  AKI in CKD stage IIIb: Currently kidney function at baseline.  Avoid nephrotoxins  History of recurrent DVTs: On Eliquis.  Eliquis  restarted   Hypertension: Triamterene-hydrochlorothiazide discontinued.  Monitor blood pressure, started on low-dose amlodipine  Chronic diastolic CHF: Home torsemide on hold due to AKI.  She says this does not take torsemide  anymore at home.  Currently appears euvolemic.  She may need  low-dose Lasix on discharge  Vitamin D deficiency: Started supplemented  Confusion/drowsiness:This is hospital acquired delirium. Minimize sedatives/narcotics.  Frequent reorientation/delirium precautions .gabapentin on hold.  CT head done on 9/28 shows as age related atrophy, chronic microvascular changes  Goals of care: Elderly  lady with left femur fracture now with C. difficile, persistent leukocytosis, intermittent confusion.  Palliative care  consulted for goals of care.            DVT prophylaxis:apixaban (ELIQUIS) tablet 2.5 mg Start: 04/07/23 1000 apixaban (ELIQUIS) tablet 2.5 mg     Code Status: Full Code  Family Communication: Called and discussed with daughter on phone on 10/7  Patient status:Inpatient  Patient is from :Home  Anticipated discharge to:SNF  Estimated DC date: After improvement in the leukocytosis   Consultants: Orthopedics,GI ,ID  Procedures: ORIF  Antimicrobials:  Anti-infectives (From admission, onward)    Start     Dose/Rate Route Frequency Ordered Stop   04/09/23 1315  fidaxomicin (DIFICID) tablet 200 mg        200 mg Oral 2 times daily 04/09/23 1226 04/19/23 0959   04/07/23 1045  piperacillin-tazobactam (ZOSYN) IVPB 3.375 g  Status:  Discontinued        3.375 g 12.5 mL/hr over 240 Minutes Intravenous Every 8 hours 04/07/23 0952 04/09/23 1226   04/06/23 2200  Ampicillin-Sulbactam (UNASYN) 3 g in sodium chloride 0.9 % 100 mL IVPB  Status:  Discontinued        3 g 200 mL/hr over 30 Minutes Intravenous Every 12 hours 04/06/23 0756 04/07/23 0947   04/05/23 1754  vancomycin (VANCOCIN) powder  Status:  Discontinued          As needed 04/05/23 1754 04/05/23 1808   04/05/23 1545  ceFAZolin (ANCEF) IVPB 2g/100 mL premix  Status:  Discontinued        2 g 200 mL/hr over 30 Minutes Intravenous On call to O.R. 04/05/23 1516 04/05/23 1550   04/05/23 1529  ceFAZolin (ANCEF) 2-4 GM/100ML-% IVPB  Status:  Discontinued       Note to Pharmacy: Shanda Bumps M: cabinet override      04/05/23 1529 04/05/23 1914   04/04/23 2200  Ampicillin-Sulbactam (UNASYN) 3 g in sodium chloride 0.9 % 100 mL IVPB  Status:  Discontinued        3 g 200 mL/hr over 30 Minutes Intravenous Every 8 hours 04/03/23 2025 04/06/23 0756   04/03/23 1800  Ampicillin-Sulbactam (UNASYN) 3 g in sodium chloride 0.9 % 100 mL IVPB        3 g 200 mL/hr over 30 Minutes Intravenous  Once 04/03/23 1747 04/03/23 2100        Subjective: Patient seen and examined at bedside today.  She was trying to eat her breakfast.  She did not complain of any abdomen, nausea or vomiting.  Abdomen is soft, nontender and has good bowel sounds.  Mentation might have slightly improved today, still confused to time but alert and awake, mostly oriented and obeys commands  Objective: Vitals:   04/11/23 1300 04/11/23 2259 04/12/23 0647 04/12/23 0731  BP: 127/62 128/73 (!) 153/73 128/66  Pulse: 88 94 92 (!) 58  Resp: 16  18 18   Temp: 97.6 F (36.4 C) 97.9 F (36.6 C) 97.8 F (36.6 C) 97.6 F (36.4 C)  TempSrc: Oral Axillary Axillary Oral  SpO2: 97% 97% 99% 98%  Weight:      Height:        Intake/Output Summary (Last 24 hours) at 04/12/2023 1108 Last data filed at 04/12/2023 0730 Gross per 24 hour  Intake 240 ml  Output --  Net 240 ml    Filed Weights   04/03/23 1339  Weight: 108 kg    Examination: General exam: Overall comfortable, not in distress,obese  HEENT: PERRL Respiratory system:  no wheezes or crackles  Cardiovascular system: S1 & S2 heard, RRR.  Gastrointestinal system: Abdomen is nondistended, soft and nontender.  Good bowel sounds Central nervous system: Alert and oriented Extremities: No edema, no clubbing ,no cyanosis, left knee surgical wound ,bruised right big toe Skin: No rashes,no icterus ,sacral pressure ulcer       Data Reviewed: I have personally reviewed following labs and imaging studies  CBC: Recent Labs  Lab 04/08/23 1819 04/09/23 0330 04/10/23 0945 04/11/23 0749 04/12/23 0519  WBC 36.0* 43.5* 44.4* 47.8* 48.6*  HGB 10.5* 10.8* 9.9* 10.0* 10.6*  HCT 34.0* 35.0* 31.7* 31.4* 34.6*  MCV 88.3 87.9 87.3 88.5 89.6  PLT 192 159 162 167 155   Basic Metabolic Panel: Recent Labs  Lab 04/07/23 0518 04/08/23 0519 04/09/23 0330 04/10/23 0945 04/12/23 0519  NA 133* 134* 138 133* 133*  K 4.4 4.2 3.6 3.6 3.8  CL 102 100 99 97* 98  CO2 18* 24 23 25  19*  GLUCOSE 101* 110* 101*  107* 98  BUN 54* 62* 71* 81* 90*  CREATININE 1.41* 1.39* 1.59* 1.54* 1.16*  CALCIUM 8.1* 8.0* 8.1* 8.0* 8.1*  MG 1.8  --   --   --   --   PHOS 5.3*  --   --   --   --      Recent Results (from the past 240 hour(s))  Blood culture (routine x 2)     Status: None   Collection Time: 04/03/23  6:22 PM   Specimen: BLOOD  Result Value Ref Range Status   Specimen Description   Final    BLOOD LEFT ANTECUBITAL Performed at [facility], [address], [place], [place] [address]    Special Requests   Final    BOTTLES DRAWN AEROBIC AND ANAEROBIC Blood Culture adequate volume Performed at [facility], [address] [place], [place] [address]    Culture   Final    NO GROWTH 5 DAYS Performed at [facility], [address], [place], [place] [address]    Report Status 04/08/2023 FINAL  Final  Blood culture (routine x 2)     Status: None   Collection Time: 04/03/23  6:37 PM   Specimen: BLOOD  Result Value Ref Range Status   Specimen Description   Final    BLOOD RIGHT ANTECUBITAL Performed at [facility], [address] [place], [place] [address]    Special Requests   Final    BOTTLES DRAWN AEROBIC AND ANAEROBIC Blood Culture adequate volume Performed at [facility], [address], [place], [place] [address]    Culture   Final    NO GROWTH 5 DAYS Performed at [facility], [address] [place], [place] [address]    Report Status 04/08/2023 FINAL  Final  Surgical pcr screen     Status: None   Collection Time: 04/05/23  8:06 AM   Specimen: Nasal Mucosa; Nasal Swab  Result Value Ref Range Status   MRSA, PCR NEGATIVE NEGATIVE Final   Staphylococcus aureus NEGATIVE NEGATIVE Final    Comment: (NOTE) The Xpert SA Assay (FDA approved for NASAL specimens in patients 51 years of age and older), is one component of a comprehensive surveillance program. It is not intended to diagnose infection nor to guide or monitor  treatment. Performed at [facility], [address] [place], [place] [address]   Urine Culture (for  pregnant, neutropenic or urologic patients or patients with an indwelling urinary catheter)     Status: Abnormal   Collection Time: 04/07/23  9:58 AM   Specimen: Urine, Clean Catch  Result Value Ref Range Status   Specimen Description URINE, CLEAN CATCH  Final   Special Requests NONE  Final   Culture (A)  Final    <10,000 COLONIES/mL INSIGNIFICANT GROWTH Performed at [facility], [address] [place], [place] [address]    Report Status 04/08/2023 FINAL  Final  C Difficile Quick Screen (NO PCR Reflex)     Status: Abnormal   Collection Time: 04/09/23  9:31 AM   Specimen: STOOL  Result Value Ref Range Status   C Diff antigen POSITIVE (A) NEGATIVE Final   C Diff toxin POSITIVE (A) NEGATIVE Final   C Diff interpretation Toxin producing C. difficile detected.  Final    Comment: CRITICAL RESULT CALLED TO, READ BACK BY AND VERIFIED WITH: RN CHRIS E 1040 [id number] FCP Performed at [facility], [address], [place], [place] [address]   Culture, blood (Routine X 2) w Reflex to ID Panel     Status: None (Preliminary result)   Collection Time: 04/09/23  3:44 PM   Specimen: BLOOD LEFT ARM  Result Value Ref Range Status   Specimen Description BLOOD LEFT ARM  Final   Special Requests   Final    BOTTLES DRAWN AEROBIC AND ANAEROBIC Blood Culture adequate volume   Culture   Final    NO GROWTH 3 DAYS Performed at [facility], [address], [place], [place] [address]    Report Status PENDING  Incomplete  Culture, blood (Routine X 2) w Reflex to ID Panel     Status: None (Preliminary result)   Collection Time: 04/09/23  3:51 PM   Specimen: BLOOD RIGHT HAND  Result Value Ref Range Status   Specimen Description BLOOD RIGHT HAND  Final   Special Requests   Final    BOTTLES DRAWN AEROBIC ONLY Blood Culture results may not be optimal due to an inadequate volume of  blood received in culture bottles   Culture   Final    NO GROWTH 3 DAYS Performed at [facility], [address], [place], [place] [address]    Report Status PENDING  Incomplete     Radiology Studies: No results found.  Scheduled Meds:  sodium chloride   Intravenous Once   sodium chloride   Intravenous Once   acetaminophen  1,000 mg Oral Q6H   amLODipine  5 mg Oral Daily   apixaban  2.5 mg Oral BID   fidaxomicin  200 mg Oral BID   Gerhardt's butt cream   Topical TID   Vitamin D (Ergocalciferol)  50,000 Units Oral Q7 days   Continuous Infusions:     LOS: 9 days   Burnadette Pop, MD [facility] P10/01/2023, 11:08 AM

## 2023-04-12 NOTE — Consult Note (Signed)
[facility] Customer service manager [facility]) Accountable Care Organization (ACO) [facility] Liaison Note  04/12/2023  Chelsea Keller 1934/08/31 [id number]  Covering [facility] Liaison)  Location: [facility] Liaison screened the patient remotely at [facility].  Insurance:  Medicare   Chelsea Keller is a 88 y.o. female who is a Primary Care Patient of [name], MD [facility] at [facility]. The patient was screened for  readmission hospitalization with noted medium risk score for unplanned readmission risk with 2 IP in 6 months.  The patient was assessed for potential [facility]) Care Management service needs for post hospital transition for care coordination. Review of patient's electronic medical record reveals patient was admitted for generalized abdominal pain from a closed fracture. Pt will be discharged to a SNF ([facility]) for STR. Liaison will alert PAC-RN on pt's disposition closer to pt's discharge.  Plan: [facility] Liaison will continue to follow progress and disposition to asess for post hospital community care coordination/management needs.  Referral request for community care coordination: pending disposition.   [facility] Care Management/Population Health does not replace or interfere with any arrangements made by the Inpatient Transition of Care team.   For questions contact:   Elliot Cousin, RN, [facility] Liaison [place]   Population Health Office Hours MTWF  8:00 am-6:00 pm [phone] mobile [phone] [Office toll free line] Office Hours are M-F 8:30 - 5 pm [email or web address]

## 2023-04-12 NOTE — TOC Benefit Eligibility Note (Signed)
Patient Product/process development scientist completed.    The patient is insured through [employer]. Patient has Medicare and is not eligible for a copay card, but may be able to apply for patient assistance, if available.    Ran test claim for Dificid 200 mg and the current 10 day co-pay is $25.00.  Ran test claim for Vancomycin 125 mg and the current 10 day co-pay is $10.00.  This test claim was processed through [facility]- copay amounts may vary at other pharmacies due to pharmacy/plan contracts, or as the patient moves through the different stages of their insurance plan.     Roland Earl, CPHT Pharmacy Technician III Certified Patient Advocate [facility] Patient Advocate Team Direct Number: [phone]  Fax: [phone]

## 2023-04-12 NOTE — Plan of Care (Signed)
  Problem: Activity: Goal: Risk for activity intolerance will decrease Outcome: Progressing   Problem: Nutrition: Goal: Adequate nutrition will be maintained Outcome: Progressing   Problem: Coping: Goal: Level of anxiety will decrease Outcome: Progressing   Problem: Pain Managment: Goal: General experience of comfort will improve Outcome: Progressing   

## 2023-04-13 DIAGNOSIS — S72409A Unspecified fracture of lower end of unspecified femur, initial encounter for closed fracture: Secondary | ICD-10-CM | POA: Diagnosis not present

## 2023-04-13 DIAGNOSIS — Z7189 Other specified counseling: Secondary | ICD-10-CM

## 2023-04-13 DIAGNOSIS — Z515 Encounter for palliative care: Secondary | ICD-10-CM

## 2023-04-13 LAB — CBC
HCT: 31.4 % — ABNORMAL LOW (ref 36.0–46.0)
Hemoglobin: 10.1 g/dL — ABNORMAL LOW (ref 12.0–15.0)
MCH: 27.4 pg (ref 26.0–34.0)
MCHC: 32.2 g/dL (ref 30.0–36.0)
MCV: 85.3 fL (ref 80.0–100.0)
Platelets: 164 10*3/uL (ref 150–400)
RBC: 3.68 MIL/uL — ABNORMAL LOW (ref 3.87–5.11)
RDW: 22.5 % — ABNORMAL HIGH (ref 11.5–15.5)
WBC: 45.9 10*3/uL — ABNORMAL HIGH (ref 4.0–10.5)
nRBC: 0.2 % (ref 0.0–0.2)

## 2023-04-13 MED ORDER — MEDIHONEY WOUND/BURN DRESSING EX PSTE
1.0000 | PASTE | Freq: Every day | CUTANEOUS | Status: DC
  Administered 2023-04-13 – 2023-04-15 (×3): 1 via TOPICAL
  Filled 2023-04-13: qty 44

## 2023-04-13 MED ORDER — ACETAMINOPHEN 500 MG PO TABS
1000.0000 mg | ORAL_TABLET | Freq: Four times a day (QID) | ORAL | Status: DC | PRN
  Administered 2023-04-13 – 2023-04-14 (×3): 1000 mg via ORAL
  Filled 2023-04-13 (×3): qty 2

## 2023-04-13 MED ORDER — SODIUM CHLORIDE 0.9 % IV SOLN: INTRAVENOUS | Status: DC

## 2023-04-13 NOTE — Progress Notes (Signed)
Occupational Therapy Treatment Patient Details Name: Chelsea Keller MRN: [id number] DOB: [birth year] Today's Date: 04/13/2023   History of present illness Pt is 88 y.o. female s/p left ORIF left femur fx sustained from a mechanical fall at home. PMH: chronic diastolic CHF, essential hypertension, chronic blood loss anemia requiring blood transfusion, morbid obesity   OT comments  Patient received in supine and declined getting OOB but agreed to bed level OT. Patient performed grooming tasks with assistance with toothpaste and completing combing hair. Patient performed LUE shoulder AROM and strengthening with elbow flexion/extension with yellow therapy band. AROM attempted with RUE but patient stated difficulty due to shoulder pain. Patient will benefit from continued inpatient follow up therapy, <3 hours/day to continue to address grooming, toilet transfers, and UE HEP. Acute OT to continue to follow.       If plan is discharge home, recommend the following:  Two people to help with walking and/or transfers;Two people to help with bathing/dressing/bathroom;Assist for transportation;Help with stairs or ramp for entrance   Equipment Recommendations  Other (comment) (defer to next venue of care)    Recommendations for Other Services      Precautions / Restrictions Precautions Precautions: Fall Precaution Comments: pt with h/o >3 falls with fracture/major injury Restrictions Weight Bearing Restrictions: Yes LLE Weight Bearing: Touchdown weight bearing       Mobility Bed Mobility Overal bed mobility: Needs Assistance Bed Mobility: Rolling Rolling: Total assist         General bed mobility comments: rolling in bed to position for comfort    Transfers Overall transfer level: Needs assistance                 General transfer comment: refused OOB     Balance Overall balance assessment: Needs assistance, History of Falls     Sitting balance - Comments: refused sitting on  EOB                                   ADL either performed or assessed with clinical judgement   ADL Overall ADL's : Needs assistance/impaired     Grooming: Wash/dry hands;Wash/dry face;Oral care;Brushing hair;Minimal assistance;Bed level Grooming Details (indicate cue type and reason): min assist to comb hair and assistance with toothpaste                                    Extremity/Trunk Assessment              Vision       Perception     Praxis      Cognition Arousal: Alert Behavior During Therapy: WFL for tasks assessed/performed Overall Cognitive Status: Within Functional Limits for tasks assessed                                 General Comments: appeared confused, asking to make more narrow        Exercises Exercises: General Upper Extremity General Exercises - Upper Extremity Shoulder Flexion: AROM, Left, 10 reps Elbow Flexion: Strengthening, Left, 10 reps, Theraband Theraband Level (Elbow Flexion): Level 1 (Yellow) Elbow Extension: Strengthening, Left, 10 reps, Theraband Theraband Level (Elbow Extension): Level 1 (Yellow)    Shoulder Instructions       General Comments Patient declined OOB stating that she was told not  to leave bed, even after informing patient that nursing stated it was okay to transfer from bed    Pertinent Vitals/ Pain       Pain Assessment Pain Assessment: Faces Faces Pain Scale: Hurts a little bit Pain Location: left leg, right shoulder with movement Pain Descriptors / Indicators: Operative site guarding, Grimacing Pain Intervention(s): Limited activity within patient's tolerance, Monitored during session, Premedicated before session, Repositioned  Home Living                                          Prior Functioning/Environment              Frequency  Min 1X/week        Progress Toward Goals  OT Goals(current goals can now be found in the care  plan section)  Progress towards OT goals: Progressing toward goals  Acute Rehab OT Goals Patient Stated Goal: get stronger OT Goal Formulation: With patient Time For Goal Achievement: 04-21-23 Potential to Achieve Goals: Good ADL Goals Pt Will Perform Grooming: with set-up;sitting Pt Will Perform Lower Body Bathing: with max assist;sitting/lateral leans;sit to/from stand;with adaptive equipment Pt Will Transfer to Toilet: with max assist;bedside commode;stand pivot transfer Pt/caregiver will Perform Home Exercise Program: Increased strength;Both right and left upper extremity;With theraband;Independently;With written HEP provided Additional ADL Goal #1: Pt will increase ability to transition from supine to sitting EOB with HOB elevated in order to progress OOB activity and participate in ADL tasks.  Plan      Co-evaluation                 AM-PAC OT "6 Clicks" Daily Activity     Outcome Measure   Help from another person eating meals?: A Little Help from another person taking care of personal grooming?: A Little Help from another person toileting, which includes using toliet, bedpan, or urinal?: Total Help from another person bathing (including washing, rinsing, drying)?: Total Help from another person to put on and taking off regular upper body clothing?: A Lot Help from another person to put on and taking off regular lower body clothing?: Total 6 Click Score: 11    End of Session    OT Visit Diagnosis: Muscle weakness (generalized) (M62.81);Pain;History of falling (Z91.81) Pain - Right/Left: Left Pain - part of body: Leg   Activity Tolerance Patient tolerated treatment well   Patient Left in bed;with call bell/phone within reach;with bed alarm set   Nurse Communication Mobility status        Time: 0272-5366 OT Time Calculation (min): 30 min  Charges: OT General Charges $OT Visit: 1 Visit OT Treatments $Self Care/Home Management : 8-22 mins $Therapeutic  Exercise: 8-22 mins  Alfonse Flavors, OTA Acute Rehabilitation Services  Office [phone]   Dewain Penning 04/13/2023, 2:18 PM

## 2023-04-13 NOTE — Consult Note (Signed)
Palliative Medicine Inpatient Consult Note  Consulting Provider:  Burnadette Pop, MD   Reason for consult:   Palliative Care Consult Services Palliative Medicine Consult  Reason for Consult? Came with left hip fracture.  Developed C. difficile.  Prolonged hospitalization for leukocytosis, confusion from likely hospital-acquired delirium.  Needs to discuss goals of care.   04/13/2023  HPI:  Per intake H&P --> Patient is a 88 year old female with history of chronic diastolic CHF, hypertension, chronic blood loss anemia requiring blood transfusion, morbid obesity who presented to the emergency department with complaint of fall at home when she lost her balance while coming out of the bathroom. (+)distal femoral metaphysis  S/P L ORIF. (+) C.Diff. Ongoing encephalopathy.   The palliative medicine team has been asked to get involved to further address goals of care in the setting of chronic disease burden and ongoing encephalopathy.  Clinical Assessment/Goals of Care:  *Please note that this is a verbal dictation therefore any spelling or grammatical errors are due to the "Dragon Medical One" system interpretation.  I have reviewed medical records including EPIC notes, labs and imaging, received report from bedside RN, assessed the patient who is lying in bed pleasantly disoriented.    I spoke to patient's daughter, Sinia Antosh over the phone to further discuss diagnosis prognosis, GOC, EOL wishes, disposition and options.   I introduced Palliative Medicine as specialized medical care for people living with serious illness. It focuses on providing relief from the symptoms and stress of a serious illness. The goal is to improve quality of life for both the patient and the family.  Medical History Review and Understanding:  I independently reviewed Reginia's past medical history inclusive of hypertension, chronic blood loss anemia, heart failure, chronic kidney disease, arthritis, deep vein  thromboses, and obesity.  Reviewed active disease burden in the setting of C. difficile infection.  Social History:  Liane is originally from [place].  She moved to [place] in 2006 to live with her daughter.  Her husband passed in 2013 of cancer.  She has 4 daughters, 10 grandchildren, and 3 great-grandchildren.  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  Hailly is a woman of faith and practices within Catholicism.  Functional and Nutritional State:  Preceding hospitalization Lynzi required some assistance though was able to utilize her rollator for mobility.  She has a chairlift in her home to get upstairs.  When out at the grocery market she uses the ride on grocery carts.  She has had a fairly good appetite.  Palliative Symptoms:  Encephalopathy ongoing since hospitalization  Advance Directives:  A detailed discussion was had today regarding advanced directives.    Code Status: Concepts specific to code status, artifical feeding and hydration, continued IV antibiotics and rehospitalization was had.  The difference between a aggressive medical intervention path  and a palliative comfort care path for this patient at this time was had.   Encouraged patient/family to consider DNR/DNI status understanding evidenced based poor outcomes in similar hospitalized patient, as the cause of arrest is likely associated with advanced chronic/terminal illness rather than an easily reversible acute cardio-pulmonary event. I explained that DNR/DNI does not change the medical plan and it only comes into effect after a person has arrested (died).  It is a protective measure to keep [place] from harming the patient in their last moments of life.   Provided patients daughter "Hard Choices for [employer]" booklet via email and a  MOST form for review and completion.   Discussion:  We discussed patient's reasoning for hospitalization inclusive of her fall  resulting in the need for surgical intervention.  Icey has had ongoing altered mental state as a result of her hospital stay and acute C. difficile infection.  Provided education on delirium and how it is identified as an acute alteration in patient's mental state.  We reviewed that this unfortunately can last for days to weeks to possibly months.  I shared that while some patients can get back to their baseline level of cognitive functioning other patients have a new baseline level of cognitive functioning and comprehension.  Patient's daughter shares concern that Schwarz is not eating or drinking sufficiently.  She asks if she may be placed on IV fluids.  I did explain that there is a shortage of IV fluids and medical teams are encouraged to use them only if absolutely necessary.  Patient's daughter also asks about carols previous UTI and if that has been treated.  I shared that I would pass these questions along to the hospitalist service for them to provide her with updates.  Aram Beecham and I agreed to meet Wednesday at 2 PM for further conversations related to goals of care and cardiopulmonary resuscitation status.  Discussed the importance of continued conversation with family and their  medical providers regarding overall plan of care and treatment options, ensuring decisions are within the context of the patients values and GOCs.  Decision Maker:  SUMMARY OF RECOMMENDATIONS   FULL CODE --> Provided Hard Choices book and MOST form for patients daughter to review  Plan for family meeting Wednesday at [facility]  Open and honest conversations held in the setting of patients complicated hospitalization  Ongoing PMT support  Code Status/Advance Care Planning: FULL CODE   Symptom Management:  Encephalopathy: -Strict delirium precautions  Palliative Prophylaxis:  Aspiration, Bowel Regimen, Delirium Protocol, Frequent Pain Assessment, Oral Care, Palliative Wound Care, and Turn  Reposition  Additional Recommendations (Limitations, Scope, Preferences): Continue current care  Psycho-social/Spiritual:  Desire for further Chaplaincy support: Patient is Catholic Additional Recommendations: Education on delirium   Prognosis: Patient has had chronic disease burden and has endured a prolonged hospitalization.  In addition has had a recent hip fracture and repair.  All of this places her at a higher 23-month mortality risk.  Discharge Planning: Discharge will be to skilled nursing once medically optimized.  Vitals:   04/13/23 0443 04/13/23 0709  BP: (!) 129/58 115/62  Pulse: 96 85  Resp: 18 18  Temp: 97.7 F (36.5 C) 97.7 F (36.5 C)  SpO2: 97% 99%   No intake or output data in the 24 hours ending 04/13/23 1610 Last Weight  Most recent update: 04/03/2023  1:39 PM    Weight  108 kg (238 lb 1.6 oz)            Gen: Elderly Caucasian female chronically ill-appearing HEENT: moist mucous membranes CV: Regular rate and rhythm PULM: On room air breathing is even and nonlabored ABD: soft/nontender EXT: Bilateral pedal edema Neuro: Oriented to self and place but not clear on situation  PPS: 10%   This conversation/these recommendations were discussed with patient primary care team, Dr. Renford Dills  Billing based on MDM: High  Problems Addressed: One acute or chronic illness or injury that poses a threat to life or bodily function  Amount and/or Complexity of Data: Category 3:Discussion of management or test interpretation with external physician/other qualified health care professional/appropriate source (not separately reported)  Risks: Decision not to resuscitate or to de-escalate care because of poor prognosis ______________________________________________________ Lamarr Lulas [facility] Palliative Medicine Team Team Cell Phone: [phone] Please utilize secure chat with additional questions, if there is no response within 30 minutes please call  the above phone number  Palliative Medicine Team providers are available by phone from 7am to 7pm daily and can be reached through the team cell phone.  Should this patient require assistance outside of these hours, please call the patient's attending physician.

## 2023-04-13 NOTE — Plan of Care (Signed)
  Problem: Education: Goal: Knowledge of General Education information will improve Description: Including pain rating scale, medication(s)/side effects and non-pharmacologic comfort measures Outcome: Progressing   Problem: Activity: Goal: Risk for activity intolerance will decrease Outcome: Progressing   Problem: Coping: Goal: Level of anxiety will decrease Outcome: Progressing   Problem: Elimination: Goal: Will not experience complications related to bowel motility Outcome: Progressing   

## 2023-04-13 NOTE — Consult Note (Addendum)
WOC Nurse Consult Note: Reason for Consult: Refer to previous WOC consult notes on 10/4; previously noted moisture associated skin damage and Deep tissue pressure injury has evolved into an Unstageable pressure injury to the coccyx; 2X2cm, moist yellow slough with small amt yellow drainage. Pt is immobile with a high BMI and is frequently incontinent of urine and stool; Purewick in place to attempt to keep urine contained. It is difficult to keep the affected area from becoming soiled. Cleaned and linens changed. She is on a low airloss mattress to reduce pressure. Pressure Injury POA: No Dressing procedure/placement/frequency: Topical treatment orders provided for bedside nurses to perform as follows to assist with removal of nonviable tissue: Apply Medihoney to sacrum wound Q day, then cover with foam dressing.  Change foam dressing Q 3 days or PRN soiling. WOC team will assess the location weekly to determine if a change in the plan of care is indicated at that time. Thank-you,  Cammie Mcgee MSN, RN, CWOCN, [place], CNS [phone]

## 2023-04-13 NOTE — Progress Notes (Signed)
PROGRESS NOTE  Chelsea Keller  NTI:[id number] DOB: October 03, 1934 DOA: 04/03/2023 PCP: [name], MD   Brief Narrative: Patient is a 88 year old female with history of chronic diastolic CHF, hypertension, chronic blood loss anemia requiring blood transfusion, morbid obesity who presented to the emergency department with complaint of fall at home when she lost her balance while coming out of the bathroom.  She was also having 4 to 5 weeks onset of abdominal pain.  Left knee x-ray showed acute comminuted fracture of distal femoral metaphysis at the level of femoral complaint of knee replacement.  Orthopedics consulted, status post ORIF.  CT imaging showed pericolonic inflammation along the descending colon .  Started antibiotics.  PT/OT recommending SNF.  Hospital course remarkable for persistent leukocytosis.  Follow-up CT imaging on 10/12 showed persistent colitis.  C diff checked on 10/4, found to be positive.  Started on Dificid.  Hospital course remarkable for persistent  leukocytosis, ID also consulted, weakness/deconditioning, hospital delirium.  Palliative care also consulted for goals of care.  Assessment & Plan:  Principal Problem:   Closed fracture of distal end of femur, unspecified fracture morphology, initial encounter (HCC) Active Problems:   Essential hypertension   Chronic diastolic CHF (congestive heart failure) (HCC)   Acute diarrhea   Anemia of chronic disease   Acute diverticulitis   Leukocytosis   Chronic kidney disease, stage 3b (HCC)   Recurrent deep vein thrombosis (DVT) (HCC)   C. difficile colitis   Infectious diarrhea  Acute commuted fracture of distal femoral metaphysis on the left: Orthopedics consulted, status post ORIF.  PT/OT recommending SNF on discharge.  Continue pain management, supportive care, bowel regimen.  Orthopedics recommending follow-up as an outpatient after 2 weeks for wound check and repeat x-rays. Patient has bruise on the right great toe.   X-ray of the right great toe showed fracture of the base of distal phalanx.  As per imaging,osteomyelitis is not excluded but patient does not have any ulcer.  At this point, osteomyelitis is less likely.  If there is drainage or formation of ulcer in the future, can do MRI as an outpatient  C. difficile colitis/severe leukocytosis: She was having 4 to 5 weeks history of lower abdominal pain.  CT imaging done on admission showed  pericolonic inflammation along the descending colon suggesting diverticulitis.  Her leukocytosis severely worsened. CT abdomen/pelvis done on 10/2 showed progressive colitis with bowel edema.    Antibiotics changed to Zosyn.  Blood cultures have remained negative so far.  She started having worsening of diarrhea on 10/4.  C. difficile came out to be positive. Started on Dificid.  Zosyn discontinued.  GI also following.  Diarrhea is slowing down.  Denies any abd pain, nausea or vomiting today.  Leukocytosis persist.  ID was consulted.  Hopefully leukocytosis will improve soon, slightly down trended today  Chronic normocytic anemia: Follows with hematology, Dr. Pamelia Hoit  had blood transfusions in the past.  Monitor hemoglobin, currently stable  AKI in CKD stage IIIb: Currently kidney function at baseline.  Avoid nephrotoxins  History of recurrent DVTs: On Eliquis.  Eliquis  restarted   Hypertension: Triamterene-hydrochlorothiazide discontinued.  Monitor blood pressure, started on low-dose amlodipine  Chronic diastolic CHF:   Currently appears euvolemic.  Patient said she stopped taking torsemide at home.  Since she has poor oral intake, started on gentle IV fluids for few hours  Vitamin D deficiency: Started supplemented  Confusion/drowsiness:This is hospital acquired delirium. Minimize sedatives/narcotics.  Frequent reorientation/delirium precautions .  CT head  done on 9/28 shows as age related atrophy, chronic microvascular changes  Goals of care: Elderly lady with left  femur fracture now with C. difficile, persistent leukocytosis, intermittent confusion.  Palliative care consulted for goals of care.  Palliative care planning to meet family at 2 PM tomorrow.Remains full code            DVT prophylaxis:apixaban (ELIQUIS) tablet 2.5 mg Start: 04/07/23 1000 apixaban (ELIQUIS) tablet 2.5 mg     Code Status: Full Code  Family Communication: Called and discussed with daughter on phone on 10/8  Patient status:Inpatient  Patient is from :Home  Anticipated discharge to:SNF  Estimated DC date: After improvement in the leukocytosis, palliative care meeting   Consultants: Orthopedics,GI ,ID  Procedures: ORIF  Antimicrobials:  Anti-infectives (From admission, onward)    Start     Dose/Rate Route Frequency Ordered Stop   04/09/23 1315  fidaxomicin (DIFICID) tablet 200 mg        200 mg Oral 2 times daily 04/09/23 1226 04/19/23 0959   04/07/23 1045  piperacillin-tazobactam (ZOSYN) IVPB 3.375 g  Status:  Discontinued        3.375 g 12.5 mL/hr over 240 Minutes Intravenous Every 8 hours 04/07/23 0952 04/09/23 1226   04/06/23 2200  Ampicillin-Sulbactam (UNASYN) 3 g in sodium chloride 0.9 % 100 mL IVPB  Status:  Discontinued        3 g 200 mL/hr over 30 Minutes Intravenous Every 12 hours 04/06/23 0756 04/07/23 0947   04/05/23 1754  vancomycin (VANCOCIN) powder  Status:  Discontinued          As needed 04/05/23 1754 04/05/23 1808   04/05/23 1545  ceFAZolin (ANCEF) IVPB 2g/100 mL premix  Status:  Discontinued        2 g 200 mL/hr over 30 Minutes Intravenous On call to O.R. 04/05/23 1516 04/05/23 1550   04/05/23 1529  ceFAZolin (ANCEF) 2-4 GM/100ML-% IVPB  Status:  Discontinued       Note to Pharmacy: Shanda Bumps M: cabinet override      04/05/23 1529 04/05/23 1914   04/04/23 2200  Ampicillin-Sulbactam (UNASYN) 3 g in sodium chloride 0.9 % 100 mL IVPB  Status:  Discontinued        3 g 200 mL/hr over 30 Minutes Intravenous Every 8 hours 04/03/23 2025  04/06/23 0756   04/03/23 1800  Ampicillin-Sulbactam (UNASYN) 3 g in sodium chloride 0.9 % 100 mL IVPB        3 g 200 mL/hr over 30 Minutes Intravenous  Once 04/03/23 1747 04/03/23 2100       Subjective: Patient seen and examined at bedside today.  Appears overall comfortable.  Still very deconditioned, weak, lying in bed.  Oriented to place but not time.  Not agitated.  Denies any abdomen pain, nausea or vomiting.  Diarrhea has slowed down.  Objective: Vitals:   04/12/23 0731 04/12/23 1918 04/13/23 0443 04/13/23 0709  BP: 128/66 (!) 148/72 (!) 129/58 115/62  Pulse: (!) 58 97 96 85  Resp: 18 18 18 18   Temp: 97.6 F (36.4 C) 98 F (36.7 C) 97.7 F (36.5 C) 97.7 F (36.5 C)  TempSrc: Oral Oral Oral Oral  SpO2: 98% 99% 97% 99%  Weight:      Height:       No intake or output data in the 24 hours ending 04/13/23 1010   Filed Weights   04/03/23 1339  Weight: 108 kg    Examination:  General exam: Overall comfortable, not in  distress,obese HEENT: PERRL Respiratory system:  no wheezes or crackles  Cardiovascular system: S1 & S2 heard, RRR.  Gastrointestinal system: Abdomen is nondistended, soft and nontender.  Bowel sounds present Central nervous system: Alert and oriented Extremities: No edema, no clubbing ,no cyanosis, left knee surgical wound, bruise right big toe Skin: No rashes,no icterus, sacral pressure ulcer       Data Reviewed: I have personally reviewed following labs and imaging studies  CBC: Recent Labs  Lab 04/09/23 0330 04/10/23 0945 04/11/23 0749 04/12/23 0519 04/13/23 0801  WBC 43.5* 44.4* 47.8* 48.6* 45.9*  HGB 10.8* 9.9* 10.0* 10.6* 10.1*  HCT 35.0* 31.7* 31.4* 34.6* 31.4*  MCV 87.9 87.3 88.5 89.6 85.3  PLT 159 162 167 155 164   Basic Metabolic Panel: Recent Labs  Lab 04/07/23 0518 04/08/23 0519 04/09/23 0330 04/10/23 0945 04/12/23 0519  NA 133* 134* 138 133* 133*  K 4.4 4.2 3.6 3.6 3.8  CL 102 100 99 97* 98  CO2 18* 24 23 25  19*   GLUCOSE 101* 110* 101* 107* 98  BUN 54* 62* 71* 81* 90*  CREATININE 1.41* 1.39* 1.59* 1.54* 1.16*  CALCIUM 8.1* 8.0* 8.1* 8.0* 8.1*  MG 1.8  --   --   --   --   PHOS 5.3*  --   --   --   --      Recent Results (from the past 240 hour(s))  Blood culture (routine x 2)     Status: None   Collection Time: 04/03/23  6:22 PM   Specimen: BLOOD  Result Value Ref Range Status   Specimen Description   Final    BLOOD LEFT ANTECUBITAL Performed at [facility], [address] [place], [place] [address]    Special Requests   Final    BOTTLES DRAWN AEROBIC AND ANAEROBIC Blood Culture adequate volume Performed at [facility], [address], [place], [place] [address]    Culture   Final    NO GROWTH 5 DAYS Performed at [facility], [address] [place], [place] [address]    Report Status 04/08/2023 FINAL  Final  Blood culture (routine x 2)     Status: None   Collection Time: 04/03/23  6:37 PM   Specimen: BLOOD  Result Value Ref Range Status   Specimen Description   Final    BLOOD RIGHT ANTECUBITAL Performed at [facility], [address] [place], [place] [address]    Special Requests   Final    BOTTLES DRAWN AEROBIC AND ANAEROBIC Blood Culture adequate volume Performed at [facility], [address], [place], [place] [address]    Culture   Final    NO GROWTH 5 DAYS Performed at [facility], [address], [place], [place] [address]    Report Status 04/08/2023 FINAL  Final  Surgical pcr screen     Status: None   Collection Time: 04/05/23  8:06 AM   Specimen: Nasal Mucosa; Nasal Swab  Result Value Ref Range Status   MRSA, PCR NEGATIVE NEGATIVE Final   Staphylococcus aureus NEGATIVE NEGATIVE Final    Comment: (NOTE) The Xpert SA Assay (FDA approved for NASAL specimens in patients 15 years of age and older), is one component of a comprehensive surveillance program. It is not intended to diagnose infection nor  to guide or monitor treatment. Performed at [facility], [address] [place], [place] [address]   Urine Culture (  for pregnant, neutropenic or urologic patients or patients with an indwelling urinary catheter)     Status: Abnormal   Collection Time: 04/07/23  9:58 AM   Specimen: Urine, Clean Catch  Result Value Ref Range Status   Specimen Description URINE, CLEAN CATCH  Final   Special Requests NONE  Final   Culture (A)  Final    <10,000 COLONIES/mL INSIGNIFICANT GROWTH Performed at [facility], [address] [place], [place] [address]    Report Status 04/08/2023 FINAL  Final  C Difficile Quick Screen (NO PCR Reflex)     Status: Abnormal   Collection Time: 04/09/23  9:31 AM   Specimen: STOOL  Result Value Ref Range Status   C Diff antigen POSITIVE (A) NEGATIVE Final   C Diff toxin POSITIVE (A) NEGATIVE Final   C Diff interpretation Toxin producing C. difficile detected.  Final    Comment: CRITICAL RESULT CALLED TO, READ BACK BY AND VERIFIED WITH: RN CHRIS E 1040 [id number] FCP Performed at [facility], [address] [place], [place] [address]   Culture, blood (Routine X 2) w Reflex to ID Panel     Status: None (Preliminary result)   Collection Time: 04/09/23  3:44 PM   Specimen: BLOOD LEFT ARM  Result Value Ref Range Status   Specimen Description BLOOD LEFT ARM  Final   Special Requests   Final    BOTTLES DRAWN AEROBIC AND ANAEROBIC Blood Culture adequate volume   Culture   Final    NO GROWTH 3 DAYS Performed at [facility], [address] [place], [place] [address]    Report Status PENDING  Incomplete  Culture, blood (Routine X 2) w Reflex to ID Panel     Status: None (Preliminary result)   Collection Time: 04/09/23  3:51 PM   Specimen: BLOOD RIGHT HAND  Result Value Ref Range Status   Specimen Description BLOOD RIGHT HAND  Final   Special Requests   Final    BOTTLES DRAWN AEROBIC ONLY Blood Culture results may not be optimal due to an  inadequate volume of blood received in culture bottles   Culture   Final    NO GROWTH 3 DAYS Performed at [facility], [address], [place], [place] [address]    Report Status PENDING  Incomplete     Radiology Studies: No results found.  Scheduled Meds:  sodium chloride   Intravenous Once   sodium chloride   Intravenous Once   acetaminophen  1,000 mg Oral Q6H   amLODipine  5 mg Oral Daily   apixaban  2.5 mg Oral BID   fidaxomicin  200 mg Oral BID   Gerhardt's butt cream   Topical TID   leptospermum manuka honey  1 Application Topical Daily   Vitamin D (Ergocalciferol)  50,000 Units Oral Q7 days   Continuous Infusions:     LOS: 10 days   Burnadette Pop, MD [facility] P10/02/2023, 10:10 AM

## 2023-04-13 NOTE — Plan of Care (Signed)

## 2023-04-14 ENCOUNTER — Encounter (HOSPITAL_COMMUNITY): Payer: Self-pay | Admitting: Student

## 2023-04-14 DIAGNOSIS — Z515 Encounter for palliative care: Secondary | ICD-10-CM | POA: Diagnosis not present

## 2023-04-14 DIAGNOSIS — Z7189 Other specified counseling: Secondary | ICD-10-CM | POA: Diagnosis not present

## 2023-04-14 DIAGNOSIS — S72409A Unspecified fracture of lower end of unspecified femur, initial encounter for closed fracture: Secondary | ICD-10-CM | POA: Diagnosis not present

## 2023-04-14 LAB — CBC
HCT: 33.4 % — ABNORMAL LOW (ref 36.0–46.0)
Hemoglobin: 10.3 g/dL — ABNORMAL LOW (ref 12.0–15.0)
MCH: 27.6 pg (ref 26.0–34.0)
MCHC: 30.8 g/dL (ref 30.0–36.0)
MCV: 89.5 fL (ref 80.0–100.0)
Platelets: 222 10*3/uL (ref 150–400)
RBC: 3.73 MIL/uL — ABNORMAL LOW (ref 3.87–5.11)
RDW: 23 % — ABNORMAL HIGH (ref 11.5–15.5)
WBC: 44.8 10*3/uL — ABNORMAL HIGH (ref 4.0–10.5)
nRBC: 0.2 % (ref 0.0–0.2)

## 2023-04-14 LAB — CULTURE, BLOOD (ROUTINE X 2)
Culture: NO GROWTH
Culture: NO GROWTH
Special Requests: ADEQUATE

## 2023-04-14 LAB — BASIC METABOLIC PANEL
Anion gap: 13 (ref 5–15)
BUN: 89 mg/dL — ABNORMAL HIGH (ref 8–23)
CO2: 22 mmol/L (ref 22–32)
Calcium: 8.3 mg/dL — ABNORMAL LOW (ref 8.9–10.3)
Chloride: 98 mmol/L (ref 98–111)
Creatinine, Ser: 1.04 mg/dL — ABNORMAL HIGH (ref 0.44–1.00)
GFR, Estimated: 52 mL/min — ABNORMAL LOW (ref >60)
Glucose, Bld: 110 mg/dL — ABNORMAL HIGH (ref 70–99)
Potassium: 4.5 mmol/L (ref 3.5–5.1)
Sodium: 133 mmol/L — ABNORMAL LOW (ref 135–145)

## 2023-04-14 NOTE — Plan of Care (Signed)

## 2023-04-14 NOTE — Progress Notes (Signed)
Physical Therapy Treatment Patient Details Name: Chelsea Keller MRN: [id number] DOB: 12-Dec-1934 Today's Date: 04/14/2023   History of Present Illness Pt is 88 y.o. female s/p left ORIF left femur fx sustained from a mechanical fall at home. PMH: chronic diastolic CHF, essential hypertension, chronic blood loss anemia requiring blood transfusion, morbid obesity    PT Comments  PT spoke with daughter prior to treatment session. Daughter wanting to know why her mother is refusing therapy and not getting up to recliner. Discussed although pt had not gotten out of bed at pt request she had sat on the EoB on Monday and refused OOB on Tuesday but did do bed level exercise with OT. Assured daughter that PT would lift pt to chair so that she could participate in Palliative meeting at 2:00. Pt very apprehensive about increased pain with getting up to recliner, however Bariatric recliner procured for room. Pt total Ax2 for rolling L and R for pad placement. Pt experienced increase in L LE with lift to chair however dissipated quickly once up in recliner. D/c plans remain appropriate. PT will continue to follow acutely.    If plan is discharge home, recommend the following: Two people to help with bathing/dressing/bathroom;Two people to help with walking and/or transfers;Assistance with cooking/housework;Assist for transportation   Can travel by private vehicle     No  Equipment Recommendations  Hoyer lift;Wheelchair cushion (measurements PT);Wheelchair (measurements PT);Hospital bed       Precautions / Restrictions Precautions Precautions: Fall Precaution Comments: pt with h/o >3 falls with fracture/major injury Restrictions Weight Bearing Restrictions: Yes LLE Weight Bearing: Touchdown weight bearing     Mobility  Bed Mobility Overal bed mobility: Needs Assistance Bed Mobility: Rolling Rolling: Total assist, Used rails, +2 for physical assistance         General bed mobility comments:  totalAx2 for rolling left and right for placement of lift pad    Transfers Overall transfer level: Needs assistance                 General transfer comment: pt is very apprehensive about increased pain with getting up to recliner however pt reports "it wasn't that bad when she was up in recliner) Transfer via Lift Equipment: [place]  Ambulation/Gait               General Gait Details: Unable at this time due to current functional level       Balance Overall balance assessment: Needs assistance, History of Falls Sitting-balance support: Single extremity supported, Bilateral upper extremity supported, Feet supported, Feet unsupported Sitting balance-Leahy Scale: Poor Sitting balance - Comments: sitting up in recliner with LE elevated                                    Cognition Arousal: Alert Behavior During Therapy: WFL for tasks assessed/performed Overall Cognitive Status: Within Functional Limits for tasks assessed                                             General Comments General comments (skin integrity, edema, etc.): Pt requires increased encouragement to get up to recliner. Perseverates on when her daughter is going to arrive      Pertinent Vitals/Pain Pain Assessment Pain Assessment: Faces Faces Pain Scale: Hurts a little bit Pain Location: generalized  with movement Pain Descriptors / Indicators: Operative site guarding, Grimacing Pain Intervention(s): Limited activity within patient's tolerance, Monitored during session, Repositioned     PT Goals (current goals can now be found in the care plan section) Acute Rehab PT Goals PT Goal Formulation: With patient Time For Goal Achievement: 04/16/2023 Potential to Achieve Goals: Fair Progress towards PT goals: Progressing toward goals    Frequency    Min 1X/week       AM-PAC PT "6 Clicks" Mobility   Outcome Measure  Help needed turning from your back to your  side while in a flat bed without using bedrails?: Total Help needed moving from lying on your back to sitting on the side of a flat bed without using bedrails?: Total Help needed moving to and from a bed to a chair (including a wheelchair)?: Total Help needed standing up from a chair using your arms (e.g., wheelchair or bedside chair)?: Total Help needed to walk in hospital room?: Total Help needed climbing 3-5 steps with a railing? : Total 6 Click Score: 6    End of Session   Activity Tolerance: Patient tolerated treatment well Patient left: with call bell/phone within reach;with chair alarm set;in chair Nurse Communication: Mobility status PT Visit Diagnosis: Other abnormalities of gait and mobility (R26.89);Pain Pain - Right/Left: Left Pain - part of body: Leg     Time: 1310-1335 PT Time Calculation (min) (ACUTE ONLY): 25 min  Charges:    $Therapeutic Activity: 23-37 mins PT General Charges $$ ACUTE PT VISIT: 1 Visit                     Chelsea Keller B. Beverely Risen PT, DPT Acute Rehabilitation Services Please use secure chat or  Call Office [phone]    Chelsea Keller 04/14/2023, 3:02 PM

## 2023-04-14 NOTE — Progress Notes (Signed)
Low airloss mattress none available at this time.Portable equipment department to notify floor for availability.

## 2023-04-14 NOTE — Progress Notes (Signed)
Palliative Medicine Inpatient Follow Up Note HPI: Patient is a 88 year old female with history of chronic diastolic CHF, hypertension, chronic blood loss anemia requiring blood transfusion, morbid obesity who presented to the emergency department with complaint of fall at home when she lost her balance while coming out of the bathroom. (+)distal femoral metaphysis  S/P L ORIF. (+) C.Diff. Ongoing encephalopathy.    The palliative medicine team has been asked to get involved to further address goals of care in the setting of chronic disease burden and ongoing encephalopathy.  Today's Discussion 04/14/2023  *Please note that this is a verbal dictation therefore any spelling or grammatical errors are due to the "Dragon Medical One" system interpretation.  Chart reviewed inclusive of vital signs, progress notes, laboratory results, and diagnostic images.   I met with Earl at bedside this afternoon in the presence of her daughter, Aram Beecham.   I reviewed with Haeli and her daughter a MOST form with wishes as below:  Cardiopulmonary Resuscitation: Do Not Attempt Resuscitation (DNR/No CPR)  Medical Interventions: Limited Additional Interventions: Use medical treatment, IV fluids and cardiac monitoring as indicated, DO NOT USE intubation or mechanical ventilation. May consider use of less invasive airway support such as BiPAP or CPAP. Also provide comfort measures. Transfer to the hospital if indicated. Avoid intensive care.   Antibiotics: Antibiotics if indicated  IV Fluids: IV fluids if indicated  Feeding Tube: No feeding tube   Discussed patients goals moving forward which are to allow her to improve as much as God will let her.  Discussed the plan for [facility] for rehabilitation.  Patients daughter is agreeable to OP Palliative support on discharge. She is aware if patient neglects to thrive then hospice may be needed. She is familiar with hospice through her fathers passing in 2013.    Questions and concerns addressed/Palliative Support Provided.   Objective Assessment: Vital Signs Vitals:   04/14/23 0500 04/14/23 0745  BP: 131/69 130/61  Pulse: 88 95  Resp: 19 18  Temp: 97.8 F (36.6 C) 97.6 F (36.4 C)  SpO2: 98% 98%    Intake/Output Summary (Last 24 hours) at 04/14/2023 6962 Last data filed at 04/14/2023 0500 Gross per 24 hour  Intake 297.61 ml  Output 800 ml  Net -502.39 ml   Last Weight  Most recent update: 04/03/2023  1:39 PM    Weight  108 kg (238 lb 1.6 oz)            Gen: Elderly Caucasian female chronically ill-appearing HEENT: moist mucous membranes CV: Regular rate and rhythm PULM: On room air breathing is even and nonlabored ABD: soft/nontender EXT: Bilateral pedal edema Neuro: Oriented to self and place but not clear on situation  SUMMARY OF RECOMMENDATIONS   DNAR/DNI  MOST Completed, paper copy placed onto the chart electric copy can be found in [employer]yn[facility]  DNR Form Completed, paper copy placed onto the chart electric copy can be found in Vyn[employer]   Open and honest conversations held in the setting of patients complicated hospitalization   Ongoing PMT support  Time Spent: 52 ______________________________________________________________________________________ Lamarr Lulas [place] Palliative Medicine Team Team Cell Phone: [phone] Please utilize secure chat with additional questions, if there is no response within 30 minutes please call the above phone number  Palliative Medicine Team providers are available by phone from 7am to 7pm daily and can be reached through the team cell phone.  Should this patient require assistance outside of these hours, please call the patient's attending physician.

## 2023-04-14 NOTE — Progress Notes (Addendum)
PROGRESS NOTE    Chelsea Keller  OZH:[id number] DOB: [birth year] DOA: 04/03/2023 PCP: [name], MD    Brief Narrative:   Chelsea Keller is a 88 y.o. female with past medical history significant for chronic diastolic congestive heart failure, HTN, chronic blood loss anemia requiring blood transfusion, morbid obesity who presented to [facility] ED on 9/28 from home via EMS following fall at home with complaints of left leg pain.  Patient reports she lost her balance while coming out of the bathroom, denies loss of consciousness or striking her head.  Also endorses 4-5 weeks of abdominal pain.  Workup in the ED with imaging notable for acute comminuted fracture distal femoral metaphysis.  Orthopedics was consulted and patient underwent ORIF by Dr. Jena Gauss on 04/05/2027.  CT abdomen/pelvis on admission notable for pericolonic inflammation along the descending colon and patient was started on IV antibiotics.  Given persistent leukocytosis repeat CT abdomen/pelvis on 10/12 with persistent colitis.  C. difficile was then checked and found to be positive.  ID was consulted and started on Dificid.  Hospital course complicated by hospital-acquired delirium, weakness/deconditioning.  Assessment & Plan:   Acute comminuted left distal femoral metaphysis fracture Patient needing to ED after mechanical fall at home.  Denies loss of consciousness or striking her head.  Imaging notable for acute comminuted fracture of distal femoral metaphysis on the left.  Orthopedics was consulted and patient underwent ORIF on 04/05/2023 by Dr. Jena Gauss. -- Continue home Eliquis for DVT prophylaxis -- Outpatient follow-up with orthopedics, Dr. Jena Gauss 2 weeks after discharge for wound check and repeat x-rays  Right great toe fracture distal phalanx Imaging reviewed by orthopedics, minimally displaced fracture base of distal phalanx. --  WBAT w/ hard sole shoe  Severe leukocytosis C. difficile colitis Patient presenting with  reports of abdominal pain over the last 4-5 weeks prior to admission.  CT abdomen/pelvis on admission notable for pericolonic inflammation along the descending colon and patient was started on antibiotics with Unasyn followed by Zosyn.  Given persistent leukocytosis, repeat CT abdomen/pelvis was performed on 10/2 with findings of progressive mild relatively diffuse colonic inflammation consistent with colitis.  Infectious disease and GI was consulted and now signed off. -- WBC 14.3>>48.6>45.9>44.8 -- Difcid 200mg  PO BID x 10 days (Day #6/10) -- Continue enteric precautions -- CBC in am  Chronic normocytic anemia Follows with hematology outpatient, Dr. Georgiann Mohs.  Has required blood transfusions in the past.  Hemoglobin 10.3, stable.  Outpatient follow-up with hematology.  Acute renal failure on CKD stage IIIb -- Continue to hold home Maxide -- Cr 1.31>>1.68>>1.39>1.59>>1.16>1.04 -- Avoid nephrotoxins, renal dose all medications -- BMP in the am  History of recurrent DVTs --Continue Eliquis 2.5 mg p.o. twice daily  Essential hypertension Home medications include triamterene-HCTZ 37.5-25 mg p.o. daily. -- Continue to monitor BP off of antihypertensives  Chronic diastolic congestive heart failure Patient previously on torsemide, which she stopped at home.  TTE 06/23/2017 with LVEF 55 to 60%, grade 1 diastolic dysfunction.  Appears euvolemic.  Vitamin D deficiency Vitamin D 25-hydroxy level low at 5.84. -- Ergocalciferol 50,000 units p.o. q. 7 days x 5 weeks  Hospital-acquired delirium --Delirium precautions --Get up during the day --Encourage a familiar face to remain present throughout the day --Keep blinds open and lights on during daylight hours --Minimize the use of opioids/benzodiazepines  Goals of care Elderly female presenting with femur fracture as well as persistent leukocytosis secondary to C. difficile colitis and intermittent confusion. -- Palliative care following for  continued goals of care -- Family meeting planned for today   DVT prophylaxis: apixaban (ELIQUIS) tablet 2.5 mg Start: 04/07/23 1000 apixaban (ELIQUIS) tablet 2.5 mg    Code Status: Full Code Family Communication: No family present at bedside this morning  Disposition Plan:  Level of care: Med-Surg Status is: Inpatient Remains inpatient appropriate because:     Consultants:  Orthopedics, Dr. Jena Gauss Infectious disease Gastroenterology  Procedures:  ORIF left femur, 9/30; Dr. Jena Gauss  Antimicrobials:  Vancomycin 9/30 - 9/30 Cefazolin 9/30 - 9/30 Unasyn 9/28 - 10/1 Zosyn 10/2 - 10/3 Dificid 10/4>>   Subjective: Patient seen examined bedside, resting calmly.  Lying in bed.  No specific complaints.  Reports bowel movements now solid.  Continues with leukocytosis, trending down.  Remains afebrile.  No other specific questions or concerns at this time.  Denies headache, no dizziness, no chest pain, no shortness of breath, no abdominal pain, no fever, no shortness of breath, no palpitations, no focal weakness, no fatigue, no paresthesias.  No acute events overnight per nurse staff.  Objective: Vitals:   04/13/23 1552 04/13/23 1926 04/14/23 0500 04/14/23 0745  BP: 135/81 126/60 131/69 130/61  Pulse: 89 90 88 95  Resp: 18 19 19 18   Temp: (!) 97.5 F (36.4 C) 97.9 F (36.6 C) 97.8 F (36.6 C) 97.6 F (36.4 C)  TempSrc: Oral Oral  Oral  SpO2: 98% 98% 98% 98%  Weight:      Height:        Intake/Output Summary (Last 24 hours) at 04/14/2023 1322 Last data filed at 04/14/2023 0500 Gross per 24 hour  Intake 297.61 ml  Output 800 ml  Net -502.39 ml   Filed Weights   04/03/23 1339  Weight: 108 kg    Examination:  Physical Exam: GEN: NAD, alert and oriented x 3, elderly in appearance HEENT: NCAT, PERRL, EOMI, sclera clear, MMM PULM: CTAB w/o wheezes/crackles, normal respiratory effort, on room air CV: RRR w/o M/G/R GI: abd soft, NTND, NABS, no R/G/M MSK: no peripheral  edema, right hip incision site noted with dressing in place, clean/dry/intact, moves all extremity independently with preserved muscle strength  NEURO: No focal neurological deficits PSYCH: normal mood/affect Integumentary: Right hip surgical incision site as above, otherwise no concerning rashes/lesions/wounds nonexposed conservators is    Data Reviewed: I have personally reviewed following labs and imaging studies  CBC: Recent Labs  Lab 04/10/23 0945 04/11/23 0749 04/12/23 0519 04/13/23 0801 04/14/23 0605  WBC 44.4* 47.8* 48.6* 45.9* 44.8*  HGB 9.9* 10.0* 10.6* 10.1* 10.3*  HCT 31.7* 31.4* 34.6* 31.4* 33.4*  MCV 87.3 88.5 89.6 85.3 89.5  PLT 162 167 155 164 222   Basic Metabolic Panel: Recent Labs  Lab 04/08/23 0519 04/09/23 0330 04/10/23 0945 04/12/23 0519 04/14/23 0605  NA 134* 138 133* 133* 133*  K 4.2 3.6 3.6 3.8 4.5  CL 100 99 97* 98 98  CO2 24 23 25  19* 22  GLUCOSE 110* 101* 107* 98 110*  BUN 62* 71* 81* 90* 89*  CREATININE 1.39* 1.59* 1.54* 1.16* 1.04*  CALCIUM 8.0* 8.1* 8.0* 8.1* 8.3*   GFR: Estimated Creatinine Clearance: 45.7 mL/min (A) (by C-G formula based on SCr of 1.04 mg/dL (H)). Liver Function Tests: No results for input(s): "AST", "ALT", "ALKPHOS", "BILITOT", "PROT", "ALBUMIN" in the last 168 hours. No results for input(s): "LIPASE", "AMYLASE" in the last 168 hours. No results for input(s): "AMMONIA" in the last 168 hours. Coagulation Profile: No results for input(s): "INR", "PROTIME" in the last  168 hours. Cardiac Enzymes: No results for input(s): "CKTOTAL", "CKMB", "CKMBINDEX", "TROPONINI" in the last 168 hours. BNP (last 3 results) No results for input(s): "PROBNP" in the last 8760 hours. HbA1C: No results for input(s): "HGBA1C" in the last 72 hours. CBG: No results for input(s): "GLUCAP" in the last 168 hours. Lipid Profile: No results for input(s): "CHOL", "HDL", "LDLCALC", "TRIG", "CHOLHDL", "LDLDIRECT" in the last 72 hours. Thyroid  Function Tests: No results for input(s): "TSH", "T4TOTAL", "FREET4", "T3FREE", "THYROIDAB" in the last 72 hours. Anemia Panel: No results for input(s): "VITAMINB12", "FOLATE", "FERRITIN", "TIBC", "IRON", "RETICCTPCT" in the last 72 hours. Sepsis Labs: Recent Labs  Lab 04/07/23 1359  PROCALCITON 1.41    Recent Results (from the past 240 hour(s))  Surgical pcr screen     Status: None   Collection Time: 04/05/23  8:06 AM   Specimen: Nasal Mucosa; Nasal Swab  Result Value Ref Range Status   MRSA, PCR NEGATIVE NEGATIVE Final   Staphylococcus aureus NEGATIVE NEGATIVE Final    Comment: (NOTE) The Xpert SA Assay (FDA approved for NASAL specimens in patients 93 years of age and older), is one component of a comprehensive surveillance program. It is not intended to diagnose infection nor to guide or monitor treatment. Performed at [facility], [address], [place], [place] [address]   Urine Culture (for pregnant, neutropenic or urologic patients or patients with an indwelling urinary catheter)     Status: Abnormal   Collection Time: 04/07/23  9:58 AM   Specimen: Urine, Clean Catch  Result Value Ref Range Status   Specimen Description URINE, CLEAN CATCH  Final   Special Requests NONE  Final   Culture (A)  Final    <10,000 COLONIES/mL INSIGNIFICANT GROWTH Performed at [facility], [address] [place], [place] [address]    Report Status 04/08/2023 FINAL  Final  C Difficile Quick Screen (NO PCR Reflex)     Status: Abnormal   Collection Time: 04/09/23  9:31 AM   Specimen: STOOL  Result Value Ref Range Status   C Diff antigen POSITIVE (A) NEGATIVE Final   C Diff toxin POSITIVE (A) NEGATIVE Final   C Diff interpretation Toxin producing C. difficile detected.  Final    Comment: CRITICAL RESULT CALLED TO, READ BACK BY AND VERIFIED WITH: RN CHRIS E 1040 [id number] FCP Performed at [facility], [address], [place], [place] [address]   Culture, blood (Routine X  2) w Reflex to ID Panel     Status: None   Collection Time: 04/09/23  3:44 PM   Specimen: BLOOD LEFT ARM  Result Value Ref Range Status   Specimen Description BLOOD LEFT ARM  Final   Special Requests   Final    BOTTLES DRAWN AEROBIC AND ANAEROBIC Blood Culture adequate volume   Culture   Final    NO GROWTH 5 DAYS Performed at [facility], [address] [place], [place] [address]    Report Status 04/14/2023 FINAL  Final  Culture, blood (Routine X 2) w Reflex to ID Panel     Status: None   Collection Time: 04/09/23  3:51 PM   Specimen: BLOOD RIGHT HAND  Result Value Ref Range Status   Specimen Description BLOOD RIGHT HAND  Final   Special Requests   Final    BOTTLES DRAWN AEROBIC ONLY Blood Culture results may not be optimal due to an inadequate volume of blood received in culture bottles   Culture  Final    NO GROWTH 5 DAYS Performed at [facility], [address], [place], [place] [address]    Report Status 04/14/2023 FINAL  Final         Radiology Studies: No results found.      Scheduled Meds:  sodium chloride   Intravenous Once   sodium chloride   Intravenous Once   amLODipine  5 mg Oral Daily   apixaban  2.5 mg Oral BID   fidaxomicin  200 mg Oral BID   Gerhardt's butt cream   Topical TID   leptospermum manuka honey  1 Application Topical Daily   Vitamin D (Ergocalciferol)  50,000 Units Oral Q7 days   Continuous Infusions:   LOS: 11 days    Time spent: 52 minutes spent on chart review, discussion with nursing staff, consultants, updating family and interview/physical exam; more than 50% of that time was spent in counseling and/or coordination of care.    Alvira Philips [place], DO [facility] Available via Epic secure chat 7am-7pm After these hours, please refer to coverage provider listed on [email or web address] 04/14/2023, 1:22 PM

## 2023-04-14 NOTE — TOC Progression Note (Signed)
Transition of Care [facility]) - Progression Note    Patient Details  Name: Chelsea Keller MRN: [id number] Date of Birth: 11/11/1934  Transition of Care [facility]) CM/SW Contact  Lorri Frederick, LCSW Phone Number: 04/14/2023, 10:09 AM  Clinical Narrative:  CSW message with [facility].  They are still planning to admit pt once ready for DC.  CSW spoke with pt and daughter Chelsea Keller, confirmed that plan remains DC to [place] once stable.      Expected Discharge Plan: Skilled Nursing Facility Barriers to Discharge: Continued Medical Work up  Expected Discharge Plan and Services In-house Referral: Clinical Social Work   Post Acute Care Choice: Skilled Nursing Facility Living arrangements for the past 2 months: Single Family Home                                       Social Determinants of Health (SDOH) Interventions SDOH Screenings   Food Insecurity: No Food Insecurity (11/12/2022)  Housing: Low Risk  (11/12/2022)  Transportation Needs: No Transportation Needs (11/12/2022)  Utilities: Not At Risk (11/12/2022)  Depression (PHQ2-9): Low Risk  (02/01/2023)  Recent Concern: Depression (PHQ2-9) - Medium Risk (02/01/2023)  Tobacco Use: Medium Risk (04/05/2023)    Readmission Risk Interventions    11/13/2022   12:33 PM  Readmission Risk Prevention Plan  Post Dischage Appt Complete  Medication Screening Complete  Transportation Screening Complete

## 2023-04-15 DIAGNOSIS — A419 Sepsis, unspecified organism: Secondary | ICD-10-CM | POA: Diagnosis not present

## 2023-04-15 DIAGNOSIS — S72409A Unspecified fracture of lower end of unspecified femur, initial encounter for closed fracture: Secondary | ICD-10-CM | POA: Diagnosis not present

## 2023-04-15 DIAGNOSIS — S7292XA Unspecified fracture of left femur, initial encounter for closed fracture: Secondary | ICD-10-CM | POA: Diagnosis not present

## 2023-04-15 DIAGNOSIS — R410 Disorientation, unspecified: Secondary | ICD-10-CM | POA: Diagnosis not present

## 2023-04-15 DIAGNOSIS — J952 Acute pulmonary insufficiency following nonthoracic surgery: Secondary | ICD-10-CM | POA: Diagnosis not present

## 2023-04-15 DIAGNOSIS — G9340 Encephalopathy, unspecified: Secondary | ICD-10-CM | POA: Diagnosis not present

## 2023-04-15 DIAGNOSIS — D631 Anemia in chronic kidney disease: Secondary | ICD-10-CM | POA: Diagnosis not present

## 2023-04-15 DIAGNOSIS — M6281 Muscle weakness (generalized): Secondary | ICD-10-CM | POA: Diagnosis not present

## 2023-04-15 DIAGNOSIS — Z515 Encounter for palliative care: Secondary | ICD-10-CM | POA: Diagnosis not present

## 2023-04-15 DIAGNOSIS — D649 Anemia, unspecified: Secondary | ICD-10-CM | POA: Diagnosis not present

## 2023-04-15 DIAGNOSIS — M1990 Unspecified osteoarthritis, unspecified site: Secondary | ICD-10-CM | POA: Diagnosis not present

## 2023-04-15 DIAGNOSIS — S7292XD Unspecified fracture of left femur, subsequent encounter for closed fracture with routine healing: Secondary | ICD-10-CM | POA: Diagnosis not present

## 2023-04-15 DIAGNOSIS — G9341 Metabolic encephalopathy: Secondary | ICD-10-CM | POA: Diagnosis not present

## 2023-04-15 DIAGNOSIS — N2889 Other specified disorders of kidney and ureter: Secondary | ICD-10-CM | POA: Diagnosis not present

## 2023-04-15 DIAGNOSIS — Z7189 Other specified counseling: Secondary | ICD-10-CM | POA: Diagnosis not present

## 2023-04-15 DIAGNOSIS — S72409D Unspecified fracture of lower end of unspecified femur, subsequent encounter for closed fracture with routine healing: Secondary | ICD-10-CM | POA: Diagnosis not present

## 2023-04-15 DIAGNOSIS — I130 Hypertensive heart and chronic kidney disease with heart failure and stage 1 through stage 4 chronic kidney disease, or unspecified chronic kidney disease: Secondary | ICD-10-CM | POA: Diagnosis not present

## 2023-04-15 DIAGNOSIS — Z96652 Presence of left artificial knee joint: Secondary | ICD-10-CM | POA: Diagnosis not present

## 2023-04-15 DIAGNOSIS — K7682 Hepatic encephalopathy: Secondary | ICD-10-CM | POA: Diagnosis not present

## 2023-04-15 DIAGNOSIS — N179 Acute kidney failure, unspecified: Secondary | ICD-10-CM | POA: Diagnosis not present

## 2023-04-15 DIAGNOSIS — R918 Other nonspecific abnormal finding of lung field: Secondary | ICD-10-CM | POA: Diagnosis not present

## 2023-04-15 DIAGNOSIS — R6520 Severe sepsis without septic shock: Secondary | ICD-10-CM | POA: Diagnosis not present

## 2023-04-15 DIAGNOSIS — E162 Hypoglycemia, unspecified: Secondary | ICD-10-CM | POA: Diagnosis not present

## 2023-04-15 DIAGNOSIS — R000 Tachycardia, unspecified: Secondary | ICD-10-CM | POA: Diagnosis not present

## 2023-04-15 DIAGNOSIS — E538 Deficiency of other specified B group vitamins: Secondary | ICD-10-CM | POA: Diagnosis not present

## 2023-04-15 DIAGNOSIS — D464 Refractory anemia, unspecified: Secondary | ICD-10-CM | POA: Diagnosis not present

## 2023-04-15 DIAGNOSIS — M109 Gout, unspecified: Secondary | ICD-10-CM | POA: Diagnosis not present

## 2023-04-15 DIAGNOSIS — N1832 Chronic kidney disease, stage 3b: Secondary | ICD-10-CM | POA: Diagnosis not present

## 2023-04-15 DIAGNOSIS — M4982 Spondylopathy in diseases classified elsewhere, cervical region: Secondary | ICD-10-CM | POA: Diagnosis not present

## 2023-04-15 DIAGNOSIS — R531 Weakness: Secondary | ICD-10-CM | POA: Diagnosis not present

## 2023-04-15 DIAGNOSIS — R2689 Other abnormalities of gait and mobility: Secondary | ICD-10-CM | POA: Diagnosis not present

## 2023-04-15 DIAGNOSIS — A4159 Other Gram-negative sepsis: Secondary | ICD-10-CM | POA: Diagnosis not present

## 2023-04-15 DIAGNOSIS — R063 Periodic breathing: Secondary | ICD-10-CM | POA: Diagnosis not present

## 2023-04-15 DIAGNOSIS — R4182 Altered mental status, unspecified: Secondary | ICD-10-CM | POA: Diagnosis not present

## 2023-04-15 DIAGNOSIS — M797 Fibromyalgia: Secondary | ICD-10-CM | POA: Diagnosis not present

## 2023-04-15 DIAGNOSIS — I10 Essential (primary) hypertension: Secondary | ICD-10-CM | POA: Diagnosis not present

## 2023-04-15 DIAGNOSIS — I959 Hypotension, unspecified: Secondary | ICD-10-CM | POA: Diagnosis not present

## 2023-04-15 DIAGNOSIS — R079 Chest pain, unspecified: Secondary | ICD-10-CM | POA: Diagnosis not present

## 2023-04-15 DIAGNOSIS — I5032 Chronic diastolic (congestive) heart failure: Secondary | ICD-10-CM | POA: Diagnosis present

## 2023-04-15 DIAGNOSIS — D3502 Benign neoplasm of left adrenal gland: Secondary | ICD-10-CM | POA: Diagnosis not present

## 2023-04-15 DIAGNOSIS — M51369 Other intervertebral disc degeneration, lumbar region without mention of lumbar back pain or lower extremity pain: Secondary | ICD-10-CM | POA: Diagnosis not present

## 2023-04-15 DIAGNOSIS — A0472 Enterocolitis due to Clostridium difficile, not specified as recurrent: Secondary | ICD-10-CM | POA: Diagnosis not present

## 2023-04-15 DIAGNOSIS — E781 Pure hyperglyceridemia: Secondary | ICD-10-CM | POA: Diagnosis not present

## 2023-04-15 DIAGNOSIS — E872 Acidosis, unspecified: Secondary | ICD-10-CM | POA: Diagnosis not present

## 2023-04-15 DIAGNOSIS — R0902 Hypoxemia: Secondary | ICD-10-CM | POA: Diagnosis not present

## 2023-04-15 DIAGNOSIS — Z6841 Body Mass Index (BMI) 40.0 and over, adult: Secondary | ICD-10-CM | POA: Diagnosis not present

## 2023-04-15 DIAGNOSIS — R488 Other symbolic dysfunctions: Secondary | ICD-10-CM | POA: Diagnosis not present

## 2023-04-15 DIAGNOSIS — R6521 Severe sepsis with septic shock: Secondary | ICD-10-CM | POA: Diagnosis not present

## 2023-04-15 DIAGNOSIS — K7689 Other specified diseases of liver: Secondary | ICD-10-CM | POA: Diagnosis not present

## 2023-04-15 DIAGNOSIS — N1831 Chronic kidney disease, stage 3a: Secondary | ICD-10-CM | POA: Diagnosis not present

## 2023-04-15 DIAGNOSIS — Z66 Do not resuscitate: Secondary | ICD-10-CM | POA: Diagnosis not present

## 2023-04-15 DIAGNOSIS — D72829 Elevated white blood cell count, unspecified: Secondary | ICD-10-CM | POA: Diagnosis not present

## 2023-04-15 DIAGNOSIS — R140 Abdominal distension (gaseous): Secondary | ICD-10-CM | POA: Diagnosis not present

## 2023-04-15 DIAGNOSIS — R519 Headache, unspecified: Secondary | ICD-10-CM | POA: Diagnosis not present

## 2023-04-15 DIAGNOSIS — R404 Transient alteration of awareness: Secondary | ICD-10-CM | POA: Diagnosis not present

## 2023-04-15 DIAGNOSIS — R0989 Other specified symptoms and signs involving the circulatory and respiratory systems: Secondary | ICD-10-CM | POA: Diagnosis not present

## 2023-04-15 DIAGNOSIS — I872 Venous insufficiency (chronic) (peripheral): Secondary | ICD-10-CM | POA: Diagnosis not present

## 2023-04-15 DIAGNOSIS — R278 Other lack of coordination: Secondary | ICD-10-CM | POA: Diagnosis not present

## 2023-04-15 DIAGNOSIS — K7201 Acute and subacute hepatic failure with coma: Secondary | ICD-10-CM | POA: Diagnosis present

## 2023-04-15 DIAGNOSIS — K7290 Hepatic failure, unspecified without coma: Secondary | ICD-10-CM | POA: Diagnosis not present

## 2023-04-15 DIAGNOSIS — Z743 Need for continuous supervision: Secondary | ICD-10-CM | POA: Diagnosis not present

## 2023-04-15 DIAGNOSIS — T8484XD Pain due to internal orthopedic prosthetic devices, implants and grafts, subsequent encounter: Secondary | ICD-10-CM | POA: Diagnosis not present

## 2023-04-15 DIAGNOSIS — R41841 Cognitive communication deficit: Secondary | ICD-10-CM | POA: Diagnosis not present

## 2023-04-15 DIAGNOSIS — Z9049 Acquired absence of other specified parts of digestive tract: Secondary | ICD-10-CM | POA: Diagnosis not present

## 2023-04-15 DIAGNOSIS — Z9181 History of falling: Secondary | ICD-10-CM | POA: Diagnosis not present

## 2023-04-15 LAB — BASIC METABOLIC PANEL
Anion gap: 15 (ref 5–15)
BUN: 89 mg/dL — ABNORMAL HIGH (ref 8–23)
CO2: 21 mmol/L — ABNORMAL LOW (ref 22–32)
Calcium: 8.5 mg/dL — ABNORMAL LOW (ref 8.9–10.3)
Chloride: 99 mmol/L (ref 98–111)
Creatinine, Ser: 1.05 mg/dL — ABNORMAL HIGH (ref 0.44–1.00)
GFR, Estimated: 52 mL/min — ABNORMAL LOW (ref >60)
Glucose, Bld: 106 mg/dL — ABNORMAL HIGH (ref 70–99)
Potassium: 3.8 mmol/L (ref 3.5–5.1)
Sodium: 135 mmol/L (ref 135–145)

## 2023-04-15 LAB — CBC
HCT: 32.7 % — ABNORMAL LOW (ref 36.0–46.0)
Hemoglobin: 10.3 g/dL — ABNORMAL LOW (ref 12.0–15.0)
MCH: 28.5 pg (ref 26.0–34.0)
MCHC: 31.5 g/dL (ref 30.0–36.0)
MCV: 90.6 fL (ref 80.0–100.0)
Platelets: 220 10*3/uL (ref 150–400)
RBC: 3.61 MIL/uL — ABNORMAL LOW (ref 3.87–5.11)
RDW: 23.9 % — ABNORMAL HIGH (ref 11.5–15.5)
WBC: 44.1 10*3/uL — ABNORMAL HIGH (ref 4.0–10.5)
nRBC: 0.2 % (ref 0.0–0.2)

## 2023-04-15 MED ORDER — FIDAXOMICIN 200 MG PO TABS: 200.0000 mg | ORAL_TABLET | Freq: Two times a day (BID) | ORAL | Status: AC

## 2023-04-15 MED ORDER — AMLODIPINE BESYLATE 5 MG PO TABS: 5.0000 mg | ORAL_TABLET | Freq: Every day | ORAL | Status: DC

## 2023-04-15 NOTE — Care Management Important Message (Signed)
Important Message  Patient Details  Name: Chelsea Keller MRN: [id number] Date of Birth: 10-30-1934   Important Message Given:  Yes - Medicare IM     Sherilyn Banker 04/15/2023, 4:07 PM

## 2023-04-15 NOTE — Discharge Summary (Signed)
Procedures/Studies: DG Toe Great Right  Result Date: 04/09/2023 CLINICAL DATA:  Right great toe pain. EXAM: RIGHT GREAT TOE COMPARISON:  Right foot radiographs-03/26/2023 FINDINGS: Osteopenia. There is a potential minimally displaced obliquely oriented fracture involving the base of the distal phalanx of the great toe with associated geographic osteopenia involving the lateral aspect of the great toe. This finding is associated with adjacent soft tissue swelling. No subcutaneous emphysema. No radiopaque foreign body. Mild degenerative change of the first MTP joint with joint space loss, subchondral sclerosis and osteophytosis. No significant hallux valgus deformity. Adjacent joint spaces appear preserved given obliquity and field of view. IMPRESSION: Potential minimally displaced obliquely oriented fracture involving the base of the distal phalanx of the great toe with associated geographic osteopenia involving the lateral aspect of the great toe. While this is presumably the sequela of subacute injury, underlying infection is not excluded on the basis of this examination. Clinical correlation is advised. Further evaluation with MRI could be performed as indicated Electronically Signed   By: Simonne Come M.D.   On: 04/09/2023 10:13   VAS [place] LOWER EXTREMITY VENOUS (DVT)  Result Date: 04/07/2023  Lower Venous DVT Study Patient Name:  Chelsea Keller  Date of Exam:    04/07/2023 Medical Rec #: [id number]      Accession #:    [id number] Date of Birth: 30-Jun-1935      Patient Gender: F Patient Age:   20 years Exam Location:  [facility] Procedure:      VAS [place] LOWER EXTREMITY VENOUS (DVT) Referring Phys: PARDEEP KHATRI --------------------------------------------------------------------------------  Indications: Pain.  Risk Factors: None identified. Limitations: Poor ultrasound/tissue interface and body habitus. Comparison Study: No prior studies. Performing Technologist: Chanda Busing RVT  Examination Guidelines: A complete evaluation includes B-mode imaging, spectral Doppler, color Doppler, and power Doppler as needed of all accessible portions of each vessel. Bilateral testing is considered an integral part of a complete examination. Limited examinations for reoccurring indications may be performed as noted. The reflux portion of the exam is performed with the patient in reverse Trendelenburg.  +---------+---------------+---------+-----------+----------+-------------------+ RIGHT    CompressibilityPhasicitySpontaneityPropertiesThrombus Aging      +---------+---------------+---------+-----------+----------+-------------------+ CFV      Full           Yes      Yes                                      +---------+---------------+---------+-----------+----------+-------------------+ SFJ      Full                                                             +---------+---------------+---------+-----------+----------+-------------------+ FV Prox  Full                                                             +---------+---------------+---------+-----------+----------+-------------------+ FV Mid   Full                                                             +---------+---------------+---------+-----------+----------+-------------------+  Physician Discharge Summary  Chelsea Keller ZOX:[id number] DOB: 01/25/1935 DOA: 04/03/2023  PCP: [name], MD  Admit date: 04/03/2023 Discharge date: 04/15/2023  Admitted From: Home Disposition: SNF  Recommendations for Outpatient Follow-up:  Follow up with PCP in 1-2 weeks Follow-up with orthopedics, Dr. Jena Gauss 2 weeks for wound check and repeat x-rays Discontinue triamterene/HCTZ in favor of amlodipine 5 mg p.o. daily for blood pressure control Continue Dificid to complete 10-day course for C. difficile colitis Please obtain BMP/CBC in one week; recommend repeat vitamin D 25-hydroxy level in 2-3 months   Discharge Condition: Stable CODE STATUS: DNR Diet recommendation: Heart healthy diet  History of present illness:  Chelsea Keller is a 88 y.o. female with past medical history significant for chronic diastolic congestive heart failure, HTN, chronic blood loss anemia requiring blood transfusion, morbid obesity who presented to [facility] ED on 9/28 from home via EMS following fall at home with complaints of left leg pain.  Patient reports she lost her balance while coming out of the bathroom, denies loss of consciousness or striking her head.  Also endorses 4-5 weeks of abdominal pain.  Workup in the ED with imaging notable for acute comminuted fracture distal femoral metaphysis.  Orthopedics was consulted and patient underwent ORIF by Dr. Jena Gauss on 04/05/2027.  CT abdomen/pelvis on admission notable for pericolonic inflammation along the descending colon and patient was started on IV antibiotics.  Given persistent leukocytosis repeat CT abdomen/pelvis on 10/12 with persistent colitis.  C. difficile was then checked and found to be positive.  ID was consulted and started on Dificid.  Hospital course complicated by hospital-acquired delirium, weakness/deconditioning.   Hospital course:  Acute comminuted left distal femoral metaphysis fracture Patient needing to ED after mechanical fall at  home.  Denies loss of consciousness or striking her head.  Imaging notable for acute comminuted fracture of distal femoral metaphysis on the left.  Orthopedics was consulted and patient underwent ORIF on 04/05/2023 by Dr. Jena Gauss.  Continue home Eliquis for DVT prophylaxis. Outpatient follow-up with orthopedics, Dr. Jena Gauss 2 weeks after discharge for wound check and repeat x-rays   Right great toe fracture distal phalanx Imaging reviewed by orthopedics, minimally displaced fracture base of distal phalanx. WBAT w/ hard sole shoe   Severe leukocytosis C. difficile colitis Patient presenting with reports of abdominal pain over the last 4-5 weeks prior to admission.  CT abdomen/pelvis on admission notable for pericolonic inflammation along the descending colon and patient was started on antibiotics with Unasyn followed by Zosyn.  Given persistent leukocytosis, repeat CT abdomen/pelvis was performed on 10/2 with findings of progressive mild relatively diffuse colonic inflammation consistent with colitis.  Infectious disease and GI was consulted and now signed off.  Patient was started on Dificid 200 mg p.o. twice daily plan 10-day course.  WBC count slowly improving to 44.1 at time of discharge.  Patient has been afebrile and stools are now solid.  Recommend repeat CBC 1 week.   Chronic normocytic anemia Follows with hematology outpatient, Dr. Georgiann Mohs.  Has required blood transfusions in the past.  Hemoglobin 10.3, stable.  Outpatient follow-up with hematology.   Acute renal failure on CKD stage IIIb Home Maxide was discontinued.  Creatinine trended up to a high of 1.59 during hospitalization and improved to 1.05 at time of discharge.  Recommend repeat BMP 1 week.   History of recurrent DVTs Continue Eliquis 2.5   Essential hypertension Home medications include triamterene-HCTZ 37.5-25 mg p.o. daily; which was discontinued due to acute renal failure  Physician Discharge Summary  Chelsea Keller ZOX:[id number] DOB: 01/25/1935 DOA: 04/03/2023  PCP: [name], MD  Admit date: 04/03/2023 Discharge date: 04/15/2023  Admitted From: Home Disposition: SNF  Recommendations for Outpatient Follow-up:  Follow up with PCP in 1-2 weeks Follow-up with orthopedics, Dr. Jena Gauss 2 weeks for wound check and repeat x-rays Discontinue triamterene/HCTZ in favor of amlodipine 5 mg p.o. daily for blood pressure control Continue Dificid to complete 10-day course for C. difficile colitis Please obtain BMP/CBC in one week; recommend repeat vitamin D 25-hydroxy level in 2-3 months   Discharge Condition: Stable CODE STATUS: DNR Diet recommendation: Heart healthy diet  History of present illness:  Chelsea Keller is a 88 y.o. female with past medical history significant for chronic diastolic congestive heart failure, HTN, chronic blood loss anemia requiring blood transfusion, morbid obesity who presented to [facility] ED on 9/28 from home via EMS following fall at home with complaints of left leg pain.  Patient reports she lost her balance while coming out of the bathroom, denies loss of consciousness or striking her head.  Also endorses 4-5 weeks of abdominal pain.  Workup in the ED with imaging notable for acute comminuted fracture distal femoral metaphysis.  Orthopedics was consulted and patient underwent ORIF by Dr. Jena Gauss on 04/05/2027.  CT abdomen/pelvis on admission notable for pericolonic inflammation along the descending colon and patient was started on IV antibiotics.  Given persistent leukocytosis repeat CT abdomen/pelvis on 10/12 with persistent colitis.  C. difficile was then checked and found to be positive.  ID was consulted and started on Dificid.  Hospital course complicated by hospital-acquired delirium, weakness/deconditioning.   Hospital course:  Acute comminuted left distal femoral metaphysis fracture Patient needing to ED after mechanical fall at  home.  Denies loss of consciousness or striking her head.  Imaging notable for acute comminuted fracture of distal femoral metaphysis on the left.  Orthopedics was consulted and patient underwent ORIF on 04/05/2023 by Dr. Jena Gauss.  Continue home Eliquis for DVT prophylaxis. Outpatient follow-up with orthopedics, Dr. Jena Gauss 2 weeks after discharge for wound check and repeat x-rays   Right great toe fracture distal phalanx Imaging reviewed by orthopedics, minimally displaced fracture base of distal phalanx. WBAT w/ hard sole shoe   Severe leukocytosis C. difficile colitis Patient presenting with reports of abdominal pain over the last 4-5 weeks prior to admission.  CT abdomen/pelvis on admission notable for pericolonic inflammation along the descending colon and patient was started on antibiotics with Unasyn followed by Zosyn.  Given persistent leukocytosis, repeat CT abdomen/pelvis was performed on 10/2 with findings of progressive mild relatively diffuse colonic inflammation consistent with colitis.  Infectious disease and GI was consulted and now signed off.  Patient was started on Dificid 200 mg p.o. twice daily plan 10-day course.  WBC count slowly improving to 44.1 at time of discharge.  Patient has been afebrile and stools are now solid.  Recommend repeat CBC 1 week.   Chronic normocytic anemia Follows with hematology outpatient, Dr. Georgiann Mohs.  Has required blood transfusions in the past.  Hemoglobin 10.3, stable.  Outpatient follow-up with hematology.   Acute renal failure on CKD stage IIIb Home Maxide was discontinued.  Creatinine trended up to a high of 1.59 during hospitalization and improved to 1.05 at time of discharge.  Recommend repeat BMP 1 week.   History of recurrent DVTs Continue Eliquis 2.5   Essential hypertension Home medications include triamterene-HCTZ 37.5-25 mg p.o. daily; which was discontinued due to acute renal failure  Physician Discharge Summary  Chelsea Keller ZOX:[id number] DOB: 01/25/1935 DOA: 04/03/2023  PCP: [name], MD  Admit date: 04/03/2023 Discharge date: 04/15/2023  Admitted From: Home Disposition: SNF  Recommendations for Outpatient Follow-up:  Follow up with PCP in 1-2 weeks Follow-up with orthopedics, Dr. Jena Gauss 2 weeks for wound check and repeat x-rays Discontinue triamterene/HCTZ in favor of amlodipine 5 mg p.o. daily for blood pressure control Continue Dificid to complete 10-day course for C. difficile colitis Please obtain BMP/CBC in one week; recommend repeat vitamin D 25-hydroxy level in 2-3 months   Discharge Condition: Stable CODE STATUS: DNR Diet recommendation: Heart healthy diet  History of present illness:  Chelsea Keller is a 88 y.o. female with past medical history significant for chronic diastolic congestive heart failure, HTN, chronic blood loss anemia requiring blood transfusion, morbid obesity who presented to [facility] ED on 9/28 from home via EMS following fall at home with complaints of left leg pain.  Patient reports she lost her balance while coming out of the bathroom, denies loss of consciousness or striking her head.  Also endorses 4-5 weeks of abdominal pain.  Workup in the ED with imaging notable for acute comminuted fracture distal femoral metaphysis.  Orthopedics was consulted and patient underwent ORIF by Dr. Jena Gauss on 04/05/2027.  CT abdomen/pelvis on admission notable for pericolonic inflammation along the descending colon and patient was started on IV antibiotics.  Given persistent leukocytosis repeat CT abdomen/pelvis on 10/12 with persistent colitis.  C. difficile was then checked and found to be positive.  ID was consulted and started on Dificid.  Hospital course complicated by hospital-acquired delirium, weakness/deconditioning.   Hospital course:  Acute comminuted left distal femoral metaphysis fracture Patient needing to ED after mechanical fall at  home.  Denies loss of consciousness or striking her head.  Imaging notable for acute comminuted fracture of distal femoral metaphysis on the left.  Orthopedics was consulted and patient underwent ORIF on 04/05/2023 by Dr. Jena Gauss.  Continue home Eliquis for DVT prophylaxis. Outpatient follow-up with orthopedics, Dr. Jena Gauss 2 weeks after discharge for wound check and repeat x-rays   Right great toe fracture distal phalanx Imaging reviewed by orthopedics, minimally displaced fracture base of distal phalanx. WBAT w/ hard sole shoe   Severe leukocytosis C. difficile colitis Patient presenting with reports of abdominal pain over the last 4-5 weeks prior to admission.  CT abdomen/pelvis on admission notable for pericolonic inflammation along the descending colon and patient was started on antibiotics with Unasyn followed by Zosyn.  Given persistent leukocytosis, repeat CT abdomen/pelvis was performed on 10/2 with findings of progressive mild relatively diffuse colonic inflammation consistent with colitis.  Infectious disease and GI was consulted and now signed off.  Patient was started on Dificid 200 mg p.o. twice daily plan 10-day course.  WBC count slowly improving to 44.1 at time of discharge.  Patient has been afebrile and stools are now solid.  Recommend repeat CBC 1 week.   Chronic normocytic anemia Follows with hematology outpatient, Dr. Georgiann Mohs.  Has required blood transfusions in the past.  Hemoglobin 10.3, stable.  Outpatient follow-up with hematology.   Acute renal failure on CKD stage IIIb Home Maxide was discontinued.  Creatinine trended up to a high of 1.59 during hospitalization and improved to 1.05 at time of discharge.  Recommend repeat BMP 1 week.   History of recurrent DVTs Continue Eliquis 2.5   Essential hypertension Home medications include triamterene-HCTZ 37.5-25 mg p.o. daily; which was discontinued due to acute renal failure  Procedures/Studies: DG Toe Great Right  Result Date: 04/09/2023 CLINICAL DATA:  Right great toe pain. EXAM: RIGHT GREAT TOE COMPARISON:  Right foot radiographs-03/26/2023 FINDINGS: Osteopenia. There is a potential minimally displaced obliquely oriented fracture involving the base of the distal phalanx of the great toe with associated geographic osteopenia involving the lateral aspect of the great toe. This finding is associated with adjacent soft tissue swelling. No subcutaneous emphysema. No radiopaque foreign body. Mild degenerative change of the first MTP joint with joint space loss, subchondral sclerosis and osteophytosis. No significant hallux valgus deformity. Adjacent joint spaces appear preserved given obliquity and field of view. IMPRESSION: Potential minimally displaced obliquely oriented fracture involving the base of the distal phalanx of the great toe with associated geographic osteopenia involving the lateral aspect of the great toe. While this is presumably the sequela of subacute injury, underlying infection is not excluded on the basis of this examination. Clinical correlation is advised. Further evaluation with MRI could be performed as indicated Electronically Signed   By: Simonne Come M.D.   On: 04/09/2023 10:13   VAS [place] LOWER EXTREMITY VENOUS (DVT)  Result Date: 04/07/2023  Lower Venous DVT Study Patient Name:  Chelsea Keller  Date of Exam:    04/07/2023 Medical Rec #: [id number]      Accession #:    [id number] Date of Birth: 30-Jun-1935      Patient Gender: F Patient Age:   20 years Exam Location:  [facility] Procedure:      VAS [place] LOWER EXTREMITY VENOUS (DVT) Referring Phys: PARDEEP KHATRI --------------------------------------------------------------------------------  Indications: Pain.  Risk Factors: None identified. Limitations: Poor ultrasound/tissue interface and body habitus. Comparison Study: No prior studies. Performing Technologist: Chanda Busing RVT  Examination Guidelines: A complete evaluation includes B-mode imaging, spectral Doppler, color Doppler, and power Doppler as needed of all accessible portions of each vessel. Bilateral testing is considered an integral part of a complete examination. Limited examinations for reoccurring indications may be performed as noted. The reflux portion of the exam is performed with the patient in reverse Trendelenburg.  +---------+---------------+---------+-----------+----------+-------------------+ RIGHT    CompressibilityPhasicitySpontaneityPropertiesThrombus Aging      +---------+---------------+---------+-----------+----------+-------------------+ CFV      Full           Yes      Yes                                      +---------+---------------+---------+-----------+----------+-------------------+ SFJ      Full                                                             +---------+---------------+---------+-----------+----------+-------------------+ FV Prox  Full                                                             +---------+---------------+---------+-----------+----------+-------------------+ FV Mid   Full                                                             +---------+---------------+---------+-----------+----------+-------------------+  Physician Discharge Summary  Chelsea Keller ZOX:[id number] DOB: 01/25/1935 DOA: 04/03/2023  PCP: [name], MD  Admit date: 04/03/2023 Discharge date: 04/15/2023  Admitted From: Home Disposition: SNF  Recommendations for Outpatient Follow-up:  Follow up with PCP in 1-2 weeks Follow-up with orthopedics, Dr. Jena Gauss 2 weeks for wound check and repeat x-rays Discontinue triamterene/HCTZ in favor of amlodipine 5 mg p.o. daily for blood pressure control Continue Dificid to complete 10-day course for C. difficile colitis Please obtain BMP/CBC in one week; recommend repeat vitamin D 25-hydroxy level in 2-3 months   Discharge Condition: Stable CODE STATUS: DNR Diet recommendation: Heart healthy diet  History of present illness:  Chelsea Keller is a 88 y.o. female with past medical history significant for chronic diastolic congestive heart failure, HTN, chronic blood loss anemia requiring blood transfusion, morbid obesity who presented to [facility] ED on 9/28 from home via EMS following fall at home with complaints of left leg pain.  Patient reports she lost her balance while coming out of the bathroom, denies loss of consciousness or striking her head.  Also endorses 4-5 weeks of abdominal pain.  Workup in the ED with imaging notable for acute comminuted fracture distal femoral metaphysis.  Orthopedics was consulted and patient underwent ORIF by Dr. Jena Gauss on 04/05/2027.  CT abdomen/pelvis on admission notable for pericolonic inflammation along the descending colon and patient was started on IV antibiotics.  Given persistent leukocytosis repeat CT abdomen/pelvis on 10/12 with persistent colitis.  C. difficile was then checked and found to be positive.  ID was consulted and started on Dificid.  Hospital course complicated by hospital-acquired delirium, weakness/deconditioning.   Hospital course:  Acute comminuted left distal femoral metaphysis fracture Patient needing to ED after mechanical fall at  home.  Denies loss of consciousness or striking her head.  Imaging notable for acute comminuted fracture of distal femoral metaphysis on the left.  Orthopedics was consulted and patient underwent ORIF on 04/05/2023 by Dr. Jena Gauss.  Continue home Eliquis for DVT prophylaxis. Outpatient follow-up with orthopedics, Dr. Jena Gauss 2 weeks after discharge for wound check and repeat x-rays   Right great toe fracture distal phalanx Imaging reviewed by orthopedics, minimally displaced fracture base of distal phalanx. WBAT w/ hard sole shoe   Severe leukocytosis C. difficile colitis Patient presenting with reports of abdominal pain over the last 4-5 weeks prior to admission.  CT abdomen/pelvis on admission notable for pericolonic inflammation along the descending colon and patient was started on antibiotics with Unasyn followed by Zosyn.  Given persistent leukocytosis, repeat CT abdomen/pelvis was performed on 10/2 with findings of progressive mild relatively diffuse colonic inflammation consistent with colitis.  Infectious disease and GI was consulted and now signed off.  Patient was started on Dificid 200 mg p.o. twice daily plan 10-day course.  WBC count slowly improving to 44.1 at time of discharge.  Patient has been afebrile and stools are now solid.  Recommend repeat CBC 1 week.   Chronic normocytic anemia Follows with hematology outpatient, Dr. Georgiann Mohs.  Has required blood transfusions in the past.  Hemoglobin 10.3, stable.  Outpatient follow-up with hematology.   Acute renal failure on CKD stage IIIb Home Maxide was discontinued.  Creatinine trended up to a high of 1.59 during hospitalization and improved to 1.05 at time of discharge.  Recommend repeat BMP 1 week.   History of recurrent DVTs Continue Eliquis 2.5   Essential hypertension Home medications include triamterene-HCTZ 37.5-25 mg p.o. daily; which was discontinued due to acute renal failure  Procedures/Studies: DG Toe Great Right  Result Date: 04/09/2023 CLINICAL DATA:  Right great toe pain. EXAM: RIGHT GREAT TOE COMPARISON:  Right foot radiographs-03/26/2023 FINDINGS: Osteopenia. There is a potential minimally displaced obliquely oriented fracture involving the base of the distal phalanx of the great toe with associated geographic osteopenia involving the lateral aspect of the great toe. This finding is associated with adjacent soft tissue swelling. No subcutaneous emphysema. No radiopaque foreign body. Mild degenerative change of the first MTP joint with joint space loss, subchondral sclerosis and osteophytosis. No significant hallux valgus deformity. Adjacent joint spaces appear preserved given obliquity and field of view. IMPRESSION: Potential minimally displaced obliquely oriented fracture involving the base of the distal phalanx of the great toe with associated geographic osteopenia involving the lateral aspect of the great toe. While this is presumably the sequela of subacute injury, underlying infection is not excluded on the basis of this examination. Clinical correlation is advised. Further evaluation with MRI could be performed as indicated Electronically Signed   By: Simonne Come M.D.   On: 04/09/2023 10:13   VAS [place] LOWER EXTREMITY VENOUS (DVT)  Result Date: 04/07/2023  Lower Venous DVT Study Patient Name:  Chelsea Keller  Date of Exam:    04/07/2023 Medical Rec #: [id number]      Accession #:    [id number] Date of Birth: 30-Jun-1935      Patient Gender: F Patient Age:   20 years Exam Location:  [facility] Procedure:      VAS [place] LOWER EXTREMITY VENOUS (DVT) Referring Phys: PARDEEP KHATRI --------------------------------------------------------------------------------  Indications: Pain.  Risk Factors: None identified. Limitations: Poor ultrasound/tissue interface and body habitus. Comparison Study: No prior studies. Performing Technologist: Chanda Busing RVT  Examination Guidelines: A complete evaluation includes B-mode imaging, spectral Doppler, color Doppler, and power Doppler as needed of all accessible portions of each vessel. Bilateral testing is considered an integral part of a complete examination. Limited examinations for reoccurring indications may be performed as noted. The reflux portion of the exam is performed with the patient in reverse Trendelenburg.  +---------+---------------+---------+-----------+----------+-------------------+ RIGHT    CompressibilityPhasicitySpontaneityPropertiesThrombus Aging      +---------+---------------+---------+-----------+----------+-------------------+ CFV      Full           Yes      Yes                                      +---------+---------------+---------+-----------+----------+-------------------+ SFJ      Full                                                             +---------+---------------+---------+-----------+----------+-------------------+ FV Prox  Full                                                             +---------+---------------+---------+-----------+----------+-------------------+ FV Mid   Full                                                             +---------+---------------+---------+-----------+----------+-------------------+  Procedures/Studies: DG Toe Great Right  Result Date: 04/09/2023 CLINICAL DATA:  Right great toe pain. EXAM: RIGHT GREAT TOE COMPARISON:  Right foot radiographs-03/26/2023 FINDINGS: Osteopenia. There is a potential minimally displaced obliquely oriented fracture involving the base of the distal phalanx of the great toe with associated geographic osteopenia involving the lateral aspect of the great toe. This finding is associated with adjacent soft tissue swelling. No subcutaneous emphysema. No radiopaque foreign body. Mild degenerative change of the first MTP joint with joint space loss, subchondral sclerosis and osteophytosis. No significant hallux valgus deformity. Adjacent joint spaces appear preserved given obliquity and field of view. IMPRESSION: Potential minimally displaced obliquely oriented fracture involving the base of the distal phalanx of the great toe with associated geographic osteopenia involving the lateral aspect of the great toe. While this is presumably the sequela of subacute injury, underlying infection is not excluded on the basis of this examination. Clinical correlation is advised. Further evaluation with MRI could be performed as indicated Electronically Signed   By: Simonne Come M.D.   On: 04/09/2023 10:13   VAS [place] LOWER EXTREMITY VENOUS (DVT)  Result Date: 04/07/2023  Lower Venous DVT Study Patient Name:  Chelsea Keller  Date of Exam:    04/07/2023 Medical Rec #: [id number]      Accession #:    [id number] Date of Birth: 30-Jun-1935      Patient Gender: F Patient Age:   20 years Exam Location:  [facility] Procedure:      VAS [place] LOWER EXTREMITY VENOUS (DVT) Referring Phys: PARDEEP KHATRI --------------------------------------------------------------------------------  Indications: Pain.  Risk Factors: None identified. Limitations: Poor ultrasound/tissue interface and body habitus. Comparison Study: No prior studies. Performing Technologist: Chanda Busing RVT  Examination Guidelines: A complete evaluation includes B-mode imaging, spectral Doppler, color Doppler, and power Doppler as needed of all accessible portions of each vessel. Bilateral testing is considered an integral part of a complete examination. Limited examinations for reoccurring indications may be performed as noted. The reflux portion of the exam is performed with the patient in reverse Trendelenburg.  +---------+---------------+---------+-----------+----------+-------------------+ RIGHT    CompressibilityPhasicitySpontaneityPropertiesThrombus Aging      +---------+---------------+---------+-----------+----------+-------------------+ CFV      Full           Yes      Yes                                      +---------+---------------+---------+-----------+----------+-------------------+ SFJ      Full                                                             +---------+---------------+---------+-----------+----------+-------------------+ FV Prox  Full                                                             +---------+---------------+---------+-----------+----------+-------------------+ FV Mid   Full                                                             +---------+---------------+---------+-----------+----------+-------------------+  Physician Discharge Summary  Chelsea Keller ZOX:[id number] DOB: 01/25/1935 DOA: 04/03/2023  PCP: [name], MD  Admit date: 04/03/2023 Discharge date: 04/15/2023  Admitted From: Home Disposition: SNF  Recommendations for Outpatient Follow-up:  Follow up with PCP in 1-2 weeks Follow-up with orthopedics, Dr. Jena Gauss 2 weeks for wound check and repeat x-rays Discontinue triamterene/HCTZ in favor of amlodipine 5 mg p.o. daily for blood pressure control Continue Dificid to complete 10-day course for C. difficile colitis Please obtain BMP/CBC in one week; recommend repeat vitamin D 25-hydroxy level in 2-3 months   Discharge Condition: Stable CODE STATUS: DNR Diet recommendation: Heart healthy diet  History of present illness:  Chelsea Keller is a 88 y.o. female with past medical history significant for chronic diastolic congestive heart failure, HTN, chronic blood loss anemia requiring blood transfusion, morbid obesity who presented to [facility] ED on 9/28 from home via EMS following fall at home with complaints of left leg pain.  Patient reports she lost her balance while coming out of the bathroom, denies loss of consciousness or striking her head.  Also endorses 4-5 weeks of abdominal pain.  Workup in the ED with imaging notable for acute comminuted fracture distal femoral metaphysis.  Orthopedics was consulted and patient underwent ORIF by Dr. Jena Gauss on 04/05/2027.  CT abdomen/pelvis on admission notable for pericolonic inflammation along the descending colon and patient was started on IV antibiotics.  Given persistent leukocytosis repeat CT abdomen/pelvis on 10/12 with persistent colitis.  C. difficile was then checked and found to be positive.  ID was consulted and started on Dificid.  Hospital course complicated by hospital-acquired delirium, weakness/deconditioning.   Hospital course:  Acute comminuted left distal femoral metaphysis fracture Patient needing to ED after mechanical fall at  home.  Denies loss of consciousness or striking her head.  Imaging notable for acute comminuted fracture of distal femoral metaphysis on the left.  Orthopedics was consulted and patient underwent ORIF on 04/05/2023 by Dr. Jena Gauss.  Continue home Eliquis for DVT prophylaxis. Outpatient follow-up with orthopedics, Dr. Jena Gauss 2 weeks after discharge for wound check and repeat x-rays   Right great toe fracture distal phalanx Imaging reviewed by orthopedics, minimally displaced fracture base of distal phalanx. WBAT w/ hard sole shoe   Severe leukocytosis C. difficile colitis Patient presenting with reports of abdominal pain over the last 4-5 weeks prior to admission.  CT abdomen/pelvis on admission notable for pericolonic inflammation along the descending colon and patient was started on antibiotics with Unasyn followed by Zosyn.  Given persistent leukocytosis, repeat CT abdomen/pelvis was performed on 10/2 with findings of progressive mild relatively diffuse colonic inflammation consistent with colitis.  Infectious disease and GI was consulted and now signed off.  Patient was started on Dificid 200 mg p.o. twice daily plan 10-day course.  WBC count slowly improving to 44.1 at time of discharge.  Patient has been afebrile and stools are now solid.  Recommend repeat CBC 1 week.   Chronic normocytic anemia Follows with hematology outpatient, Dr. Georgiann Mohs.  Has required blood transfusions in the past.  Hemoglobin 10.3, stable.  Outpatient follow-up with hematology.   Acute renal failure on CKD stage IIIb Home Maxide was discontinued.  Creatinine trended up to a high of 1.59 during hospitalization and improved to 1.05 at time of discharge.  Recommend repeat BMP 1 week.   History of recurrent DVTs Continue Eliquis 2.5   Essential hypertension Home medications include triamterene-HCTZ 37.5-25 mg p.o. daily; which was discontinued due to acute renal failure  Physician Discharge Summary  Chelsea Keller ZOX:[id number] DOB: 01/25/1935 DOA: 04/03/2023  PCP: [name], MD  Admit date: 04/03/2023 Discharge date: 04/15/2023  Admitted From: Home Disposition: SNF  Recommendations for Outpatient Follow-up:  Follow up with PCP in 1-2 weeks Follow-up with orthopedics, Dr. Jena Gauss 2 weeks for wound check and repeat x-rays Discontinue triamterene/HCTZ in favor of amlodipine 5 mg p.o. daily for blood pressure control Continue Dificid to complete 10-day course for C. difficile colitis Please obtain BMP/CBC in one week; recommend repeat vitamin D 25-hydroxy level in 2-3 months   Discharge Condition: Stable CODE STATUS: DNR Diet recommendation: Heart healthy diet  History of present illness:  Chelsea Keller is a 88 y.o. female with past medical history significant for chronic diastolic congestive heart failure, HTN, chronic blood loss anemia requiring blood transfusion, morbid obesity who presented to [facility] ED on 9/28 from home via EMS following fall at home with complaints of left leg pain.  Patient reports she lost her balance while coming out of the bathroom, denies loss of consciousness or striking her head.  Also endorses 4-5 weeks of abdominal pain.  Workup in the ED with imaging notable for acute comminuted fracture distal femoral metaphysis.  Orthopedics was consulted and patient underwent ORIF by Dr. Jena Gauss on 04/05/2027.  CT abdomen/pelvis on admission notable for pericolonic inflammation along the descending colon and patient was started on IV antibiotics.  Given persistent leukocytosis repeat CT abdomen/pelvis on 10/12 with persistent colitis.  C. difficile was then checked and found to be positive.  ID was consulted and started on Dificid.  Hospital course complicated by hospital-acquired delirium, weakness/deconditioning.   Hospital course:  Acute comminuted left distal femoral metaphysis fracture Patient needing to ED after mechanical fall at  home.  Denies loss of consciousness or striking her head.  Imaging notable for acute comminuted fracture of distal femoral metaphysis on the left.  Orthopedics was consulted and patient underwent ORIF on 04/05/2023 by Dr. Jena Gauss.  Continue home Eliquis for DVT prophylaxis. Outpatient follow-up with orthopedics, Dr. Jena Gauss 2 weeks after discharge for wound check and repeat x-rays   Right great toe fracture distal phalanx Imaging reviewed by orthopedics, minimally displaced fracture base of distal phalanx. WBAT w/ hard sole shoe   Severe leukocytosis C. difficile colitis Patient presenting with reports of abdominal pain over the last 4-5 weeks prior to admission.  CT abdomen/pelvis on admission notable for pericolonic inflammation along the descending colon and patient was started on antibiotics with Unasyn followed by Zosyn.  Given persistent leukocytosis, repeat CT abdomen/pelvis was performed on 10/2 with findings of progressive mild relatively diffuse colonic inflammation consistent with colitis.  Infectious disease and GI was consulted and now signed off.  Patient was started on Dificid 200 mg p.o. twice daily plan 10-day course.  WBC count slowly improving to 44.1 at time of discharge.  Patient has been afebrile and stools are now solid.  Recommend repeat CBC 1 week.   Chronic normocytic anemia Follows with hematology outpatient, Dr. Georgiann Mohs.  Has required blood transfusions in the past.  Hemoglobin 10.3, stable.  Outpatient follow-up with hematology.   Acute renal failure on CKD stage IIIb Home Maxide was discontinued.  Creatinine trended up to a high of 1.59 during hospitalization and improved to 1.05 at time of discharge.  Recommend repeat BMP 1 week.   History of recurrent DVTs Continue Eliquis 2.5   Essential hypertension Home medications include triamterene-HCTZ 37.5-25 mg p.o. daily; which was discontinued due to acute renal failure  Procedures/Studies: DG Toe Great Right  Result Date: 04/09/2023 CLINICAL DATA:  Right great toe pain. EXAM: RIGHT GREAT TOE COMPARISON:  Right foot radiographs-03/26/2023 FINDINGS: Osteopenia. There is a potential minimally displaced obliquely oriented fracture involving the base of the distal phalanx of the great toe with associated geographic osteopenia involving the lateral aspect of the great toe. This finding is associated with adjacent soft tissue swelling. No subcutaneous emphysema. No radiopaque foreign body. Mild degenerative change of the first MTP joint with joint space loss, subchondral sclerosis and osteophytosis. No significant hallux valgus deformity. Adjacent joint spaces appear preserved given obliquity and field of view. IMPRESSION: Potential minimally displaced obliquely oriented fracture involving the base of the distal phalanx of the great toe with associated geographic osteopenia involving the lateral aspect of the great toe. While this is presumably the sequela of subacute injury, underlying infection is not excluded on the basis of this examination. Clinical correlation is advised. Further evaluation with MRI could be performed as indicated Electronically Signed   By: Simonne Come M.D.   On: 04/09/2023 10:13   VAS [place] LOWER EXTREMITY VENOUS (DVT)  Result Date: 04/07/2023  Lower Venous DVT Study Patient Name:  Chelsea Keller  Date of Exam:    04/07/2023 Medical Rec #: [id number]      Accession #:    [id number] Date of Birth: 30-Jun-1935      Patient Gender: F Patient Age:   20 years Exam Location:  [facility] Procedure:      VAS [place] LOWER EXTREMITY VENOUS (DVT) Referring Phys: PARDEEP KHATRI --------------------------------------------------------------------------------  Indications: Pain.  Risk Factors: None identified. Limitations: Poor ultrasound/tissue interface and body habitus. Comparison Study: No prior studies. Performing Technologist: Chanda Busing RVT  Examination Guidelines: A complete evaluation includes B-mode imaging, spectral Doppler, color Doppler, and power Doppler as needed of all accessible portions of each vessel. Bilateral testing is considered an integral part of a complete examination. Limited examinations for reoccurring indications may be performed as noted. The reflux portion of the exam is performed with the patient in reverse Trendelenburg.  +---------+---------------+---------+-----------+----------+-------------------+ RIGHT    CompressibilityPhasicitySpontaneityPropertiesThrombus Aging      +---------+---------------+---------+-----------+----------+-------------------+ CFV      Full           Yes      Yes                                      +---------+---------------+---------+-----------+----------+-------------------+ SFJ      Full                                                             +---------+---------------+---------+-----------+----------+-------------------+ FV Prox  Full                                                             +---------+---------------+---------+-----------+----------+-------------------+ FV Mid   Full                                                             +---------+---------------+---------+-----------+----------+-------------------+  Procedures/Studies: DG Toe Great Right  Result Date: 04/09/2023 CLINICAL DATA:  Right great toe pain. EXAM: RIGHT GREAT TOE COMPARISON:  Right foot radiographs-03/26/2023 FINDINGS: Osteopenia. There is a potential minimally displaced obliquely oriented fracture involving the base of the distal phalanx of the great toe with associated geographic osteopenia involving the lateral aspect of the great toe. This finding is associated with adjacent soft tissue swelling. No subcutaneous emphysema. No radiopaque foreign body. Mild degenerative change of the first MTP joint with joint space loss, subchondral sclerosis and osteophytosis. No significant hallux valgus deformity. Adjacent joint spaces appear preserved given obliquity and field of view. IMPRESSION: Potential minimally displaced obliquely oriented fracture involving the base of the distal phalanx of the great toe with associated geographic osteopenia involving the lateral aspect of the great toe. While this is presumably the sequela of subacute injury, underlying infection is not excluded on the basis of this examination. Clinical correlation is advised. Further evaluation with MRI could be performed as indicated Electronically Signed   By: Simonne Come M.D.   On: 04/09/2023 10:13   VAS [place] LOWER EXTREMITY VENOUS (DVT)  Result Date: 04/07/2023  Lower Venous DVT Study Patient Name:  Chelsea Keller  Date of Exam:    04/07/2023 Medical Rec #: [id number]      Accession #:    [id number] Date of Birth: 30-Jun-1935      Patient Gender: F Patient Age:   20 years Exam Location:  [facility] Procedure:      VAS [place] LOWER EXTREMITY VENOUS (DVT) Referring Phys: PARDEEP KHATRI --------------------------------------------------------------------------------  Indications: Pain.  Risk Factors: None identified. Limitations: Poor ultrasound/tissue interface and body habitus. Comparison Study: No prior studies. Performing Technologist: Chanda Busing RVT  Examination Guidelines: A complete evaluation includes B-mode imaging, spectral Doppler, color Doppler, and power Doppler as needed of all accessible portions of each vessel. Bilateral testing is considered an integral part of a complete examination. Limited examinations for reoccurring indications may be performed as noted. The reflux portion of the exam is performed with the patient in reverse Trendelenburg.  +---------+---------------+---------+-----------+----------+-------------------+ RIGHT    CompressibilityPhasicitySpontaneityPropertiesThrombus Aging      +---------+---------------+---------+-----------+----------+-------------------+ CFV      Full           Yes      Yes                                      +---------+---------------+---------+-----------+----------+-------------------+ SFJ      Full                                                             +---------+---------------+---------+-----------+----------+-------------------+ FV Prox  Full                                                             +---------+---------------+---------+-----------+----------+-------------------+ FV Mid   Full                                                             +---------+---------------+---------+-----------+----------+-------------------+

## 2023-04-15 NOTE — TOC Transition Note (Signed)
Transition of Care [facility]) - CM/SW Discharge Note   Patient Details  Name: Savhanna Sliva MRN: [id number] Date of Birth: May 15, 1935  [facility] CM/SW Contact:  Lorri Frederick, LCSW Phone Number: 04/15/2023, 10:32 AM   Clinical Narrative:   Pt discharging to [facility].  RN call report to [phone].     1000: CSW confirmed with [facility] that they can receive pt today.   Final next level of care: Skilled Nursing Facility Barriers to Discharge: Barriers Resolved   Patient Goals and CMS Choice   Choice offered to / list presented to : Patient (would like [facility])  Discharge Placement                [facility] Patient to be transferred to facility by: [facility] Name of family member notified: daughter Aram Beecham Patient and family notified of of transfer: 04/15/23  Discharge Plan and Services Additional resources added to the After Visit Summary for   In-house Referral: Clinical Social Work   Post Acute Care Choice: Skilled Nursing Facility                               Social Determinants of Health (SDOH) Interventions SDOH Screenings   Food Insecurity: No Food Insecurity (11/12/2022)  Housing: Low Risk  (11/12/2022)  Transportation Needs: No Transportation Needs (11/12/2022)  Utilities: Not At Risk (11/12/2022)  Depression (PHQ2-9): Low Risk  (02/01/2023)  Recent Concern: Depression (PHQ2-9) - Medium Risk (02/01/2023)  Tobacco Use: Medium Risk (04/05/2023)     Readmission Risk Interventions    11/13/2022   12:33 PM  Readmission Risk Prevention Plan  Post Dischage Appt Complete  Medication Screening Complete  Transportation Screening Complete

## 2023-04-16 DIAGNOSIS — D72829 Elevated white blood cell count, unspecified: Secondary | ICD-10-CM | POA: Diagnosis not present

## 2023-04-16 DIAGNOSIS — R2689 Other abnormalities of gait and mobility: Secondary | ICD-10-CM | POA: Diagnosis not present

## 2023-04-16 DIAGNOSIS — S72409D Unspecified fracture of lower end of unspecified femur, subsequent encounter for closed fracture with routine healing: Secondary | ICD-10-CM | POA: Diagnosis not present

## 2023-04-16 DIAGNOSIS — Z9181 History of falling: Secondary | ICD-10-CM | POA: Diagnosis not present

## 2023-04-16 DIAGNOSIS — D649 Anemia, unspecified: Secondary | ICD-10-CM | POA: Diagnosis not present

## 2023-04-16 DIAGNOSIS — I130 Hypertensive heart and chronic kidney disease with heart failure and stage 1 through stage 4 chronic kidney disease, or unspecified chronic kidney disease: Secondary | ICD-10-CM | POA: Diagnosis not present

## 2023-04-16 DIAGNOSIS — R531 Weakness: Secondary | ICD-10-CM | POA: Diagnosis not present

## 2023-04-16 DIAGNOSIS — R410 Disorientation, unspecified: Secondary | ICD-10-CM | POA: Diagnosis not present

## 2023-04-16 DIAGNOSIS — M6281 Muscle weakness (generalized): Secondary | ICD-10-CM | POA: Diagnosis not present

## 2023-04-16 DIAGNOSIS — I5032 Chronic diastolic (congestive) heart failure: Secondary | ICD-10-CM | POA: Diagnosis not present

## 2023-04-16 DIAGNOSIS — A0472 Enterocolitis due to Clostridium difficile, not specified as recurrent: Secondary | ICD-10-CM | POA: Diagnosis not present

## 2023-04-16 DIAGNOSIS — S7292XA Unspecified fracture of left femur, initial encounter for closed fracture: Secondary | ICD-10-CM | POA: Diagnosis not present

## 2023-04-17 ENCOUNTER — Other Ambulatory Visit: Payer: Self-pay | Admitting: Internal Medicine

## 2023-04-18 ENCOUNTER — Emergency Department (HOSPITAL_COMMUNITY): Payer: Medicare Other

## 2023-04-18 ENCOUNTER — Encounter (HOSPITAL_COMMUNITY): Payer: Self-pay | Admitting: Internal Medicine

## 2023-04-18 ENCOUNTER — Inpatient Hospital Stay (HOSPITAL_COMMUNITY)
Admission: EM | Admit: 2023-04-18 | Discharge: 2023-05-07 | DRG: 951 | Disposition: E | Payer: Medicare Other | Attending: Family Medicine | Admitting: Family Medicine

## 2023-04-18 ENCOUNTER — Other Ambulatory Visit: Payer: Self-pay

## 2023-04-18 ENCOUNTER — Other Ambulatory Visit: Payer: Self-pay | Admitting: Internal Medicine

## 2023-04-18 DIAGNOSIS — G9340 Encephalopathy, unspecified: Secondary | ICD-10-CM | POA: Diagnosis not present

## 2023-04-18 DIAGNOSIS — N179 Acute kidney failure, unspecified: Secondary | ICD-10-CM | POA: Diagnosis present

## 2023-04-18 DIAGNOSIS — E781 Pure hyperglyceridemia: Secondary | ICD-10-CM | POA: Diagnosis not present

## 2023-04-18 DIAGNOSIS — M51369 Other intervertebral disc degeneration, lumbar region without mention of lumbar back pain or lower extremity pain: Secondary | ICD-10-CM | POA: Diagnosis present

## 2023-04-18 DIAGNOSIS — I872 Venous insufficiency (chronic) (peripheral): Secondary | ICD-10-CM | POA: Diagnosis not present

## 2023-04-18 DIAGNOSIS — Z66 Do not resuscitate: Secondary | ICD-10-CM | POA: Diagnosis present

## 2023-04-18 DIAGNOSIS — Z9049 Acquired absence of other specified parts of digestive tract: Secondary | ICD-10-CM

## 2023-04-18 DIAGNOSIS — R000 Tachycardia, unspecified: Secondary | ICD-10-CM | POA: Diagnosis not present

## 2023-04-18 DIAGNOSIS — Z96652 Presence of left artificial knee joint: Secondary | ICD-10-CM | POA: Diagnosis not present

## 2023-04-18 DIAGNOSIS — M1990 Unspecified osteoarthritis, unspecified site: Secondary | ICD-10-CM | POA: Diagnosis present

## 2023-04-18 DIAGNOSIS — Z888 Allergy status to other drugs, medicaments and biological substances status: Secondary | ICD-10-CM

## 2023-04-18 DIAGNOSIS — R4182 Altered mental status, unspecified: Secondary | ICD-10-CM | POA: Diagnosis not present

## 2023-04-18 DIAGNOSIS — I130 Hypertensive heart and chronic kidney disease with heart failure and stage 1 through stage 4 chronic kidney disease, or unspecified chronic kidney disease: Secondary | ICD-10-CM | POA: Diagnosis present

## 2023-04-18 DIAGNOSIS — N1832 Chronic kidney disease, stage 3b: Secondary | ICD-10-CM | POA: Diagnosis not present

## 2023-04-18 DIAGNOSIS — G9341 Metabolic encephalopathy: Secondary | ICD-10-CM

## 2023-04-18 DIAGNOSIS — I82409 Acute embolism and thrombosis of unspecified deep veins of unspecified lower extremity: Secondary | ICD-10-CM | POA: Diagnosis present

## 2023-04-18 DIAGNOSIS — N1831 Chronic kidney disease, stage 3a: Secondary | ICD-10-CM | POA: Diagnosis present

## 2023-04-18 DIAGNOSIS — E872 Acidosis, unspecified: Secondary | ICD-10-CM | POA: Diagnosis not present

## 2023-04-18 DIAGNOSIS — R6521 Severe sepsis with septic shock: Secondary | ICD-10-CM | POA: Diagnosis not present

## 2023-04-18 DIAGNOSIS — K7682 Hepatic encephalopathy: Secondary | ICD-10-CM | POA: Diagnosis not present

## 2023-04-18 DIAGNOSIS — D649 Anemia, unspecified: Secondary | ICD-10-CM | POA: Diagnosis not present

## 2023-04-18 DIAGNOSIS — Z87891 Personal history of nicotine dependence: Secondary | ICD-10-CM

## 2023-04-18 DIAGNOSIS — Z7189 Other specified counseling: Secondary | ICD-10-CM | POA: Diagnosis not present

## 2023-04-18 DIAGNOSIS — D464 Refractory anemia, unspecified: Secondary | ICD-10-CM | POA: Diagnosis not present

## 2023-04-18 DIAGNOSIS — I5032 Chronic diastolic (congestive) heart failure: Secondary | ICD-10-CM | POA: Diagnosis not present

## 2023-04-18 DIAGNOSIS — A4159 Other Gram-negative sepsis: Secondary | ICD-10-CM | POA: Diagnosis not present

## 2023-04-18 DIAGNOSIS — R063 Periodic breathing: Secondary | ICD-10-CM | POA: Diagnosis not present

## 2023-04-18 DIAGNOSIS — R404 Transient alteration of awareness: Secondary | ICD-10-CM | POA: Diagnosis not present

## 2023-04-18 DIAGNOSIS — I10 Essential (primary) hypertension: Secondary | ICD-10-CM | POA: Diagnosis not present

## 2023-04-18 DIAGNOSIS — R0902 Hypoxemia: Secondary | ICD-10-CM | POA: Diagnosis not present

## 2023-04-18 DIAGNOSIS — E162 Hypoglycemia, unspecified: Secondary | ICD-10-CM | POA: Diagnosis present

## 2023-04-18 DIAGNOSIS — I2699 Other pulmonary embolism without acute cor pulmonale: Secondary | ICD-10-CM | POA: Diagnosis present

## 2023-04-18 DIAGNOSIS — Z8261 Family history of arthritis: Secondary | ICD-10-CM

## 2023-04-18 DIAGNOSIS — Z823 Family history of stroke: Secondary | ICD-10-CM

## 2023-04-18 DIAGNOSIS — R6520 Severe sepsis without septic shock: Secondary | ICD-10-CM

## 2023-04-18 DIAGNOSIS — R079 Chest pain, unspecified: Secondary | ICD-10-CM | POA: Diagnosis not present

## 2023-04-18 DIAGNOSIS — Z515 Encounter for palliative care: Secondary | ICD-10-CM | POA: Diagnosis not present

## 2023-04-18 DIAGNOSIS — Z881 Allergy status to other antibiotic agents status: Secondary | ICD-10-CM

## 2023-04-18 DIAGNOSIS — R519 Headache, unspecified: Secondary | ICD-10-CM | POA: Diagnosis not present

## 2023-04-18 DIAGNOSIS — Z86718 Personal history of other venous thrombosis and embolism: Secondary | ICD-10-CM

## 2023-04-18 DIAGNOSIS — D3502 Benign neoplasm of left adrenal gland: Secondary | ICD-10-CM | POA: Diagnosis not present

## 2023-04-18 DIAGNOSIS — M6281 Muscle weakness (generalized): Secondary | ICD-10-CM | POA: Diagnosis not present

## 2023-04-18 DIAGNOSIS — Z885 Allergy status to narcotic agent status: Secondary | ICD-10-CM

## 2023-04-18 DIAGNOSIS — A419 Sepsis, unspecified organism: Secondary | ICD-10-CM

## 2023-04-18 DIAGNOSIS — Z8249 Family history of ischemic heart disease and other diseases of the circulatory system: Secondary | ICD-10-CM

## 2023-04-18 DIAGNOSIS — I959 Hypotension, unspecified: Secondary | ICD-10-CM | POA: Diagnosis not present

## 2023-04-18 DIAGNOSIS — E538 Deficiency of other specified B group vitamins: Secondary | ICD-10-CM | POA: Diagnosis present

## 2023-04-18 DIAGNOSIS — K7201 Acute and subacute hepatic failure with coma: Secondary | ICD-10-CM | POA: Diagnosis not present

## 2023-04-18 DIAGNOSIS — M109 Gout, unspecified: Secondary | ICD-10-CM | POA: Diagnosis not present

## 2023-04-18 DIAGNOSIS — Z86711 Personal history of pulmonary embolism: Secondary | ICD-10-CM

## 2023-04-18 DIAGNOSIS — Z8049 Family history of malignant neoplasm of other genital organs: Secondary | ICD-10-CM

## 2023-04-18 DIAGNOSIS — R278 Other lack of coordination: Secondary | ICD-10-CM | POA: Diagnosis not present

## 2023-04-18 DIAGNOSIS — Z818 Family history of other mental and behavioral disorders: Secondary | ICD-10-CM

## 2023-04-18 DIAGNOSIS — R2689 Other abnormalities of gait and mobility: Secondary | ICD-10-CM | POA: Diagnosis not present

## 2023-04-18 DIAGNOSIS — K7689 Other specified diseases of liver: Secondary | ICD-10-CM | POA: Diagnosis not present

## 2023-04-18 DIAGNOSIS — M797 Fibromyalgia: Secondary | ICD-10-CM | POA: Diagnosis not present

## 2023-04-18 DIAGNOSIS — Z6841 Body Mass Index (BMI) 40.0 and over, adult: Secondary | ICD-10-CM | POA: Diagnosis not present

## 2023-04-18 DIAGNOSIS — N2889 Other specified disorders of kidney and ureter: Secondary | ICD-10-CM | POA: Diagnosis not present

## 2023-04-18 DIAGNOSIS — Z79899 Other long term (current) drug therapy: Secondary | ICD-10-CM

## 2023-04-18 DIAGNOSIS — Z825 Family history of asthma and other chronic lower respiratory diseases: Secondary | ICD-10-CM

## 2023-04-18 DIAGNOSIS — D631 Anemia in chronic kidney disease: Secondary | ICD-10-CM | POA: Diagnosis present

## 2023-04-18 DIAGNOSIS — R140 Abdominal distension (gaseous): Secondary | ICD-10-CM | POA: Diagnosis not present

## 2023-04-18 DIAGNOSIS — Z833 Family history of diabetes mellitus: Secondary | ICD-10-CM

## 2023-04-18 DIAGNOSIS — K7290 Hepatic failure, unspecified without coma: Secondary | ICD-10-CM | POA: Diagnosis not present

## 2023-04-18 DIAGNOSIS — R0989 Other specified symptoms and signs involving the circulatory and respiratory systems: Secondary | ICD-10-CM | POA: Diagnosis not present

## 2023-04-18 DIAGNOSIS — Z7901 Long term (current) use of anticoagulants: Secondary | ICD-10-CM

## 2023-04-18 DIAGNOSIS — Z8042 Family history of malignant neoplasm of prostate: Secondary | ICD-10-CM

## 2023-04-18 DIAGNOSIS — K529 Noninfective gastroenteritis and colitis, unspecified: Principal | ICD-10-CM

## 2023-04-18 DIAGNOSIS — S7292XD Unspecified fracture of left femur, subsequent encounter for closed fracture with routine healing: Secondary | ICD-10-CM | POA: Diagnosis not present

## 2023-04-18 DIAGNOSIS — R918 Other nonspecific abnormal finding of lung field: Secondary | ICD-10-CM | POA: Diagnosis not present

## 2023-04-18 LAB — BLOOD CULTURE ID PANEL (REFLEXED) - BCID2

## 2023-04-18 LAB — CBC WITH DIFFERENTIAL/PLATELET
Abs Immature Granulocytes: 1.5 10*3/uL — ABNORMAL HIGH (ref 0.00–0.07)
Band Neutrophils: 2 %
Basophils Absolute: 0.4 10*3/uL — ABNORMAL HIGH (ref 0.0–0.1)
Basophils Relative: 1 %
Eosinophils Absolute: 1.1 10*3/uL — ABNORMAL HIGH (ref 0.0–0.5)
Eosinophils Relative: 3 %
HCT: 33.4 % — ABNORMAL LOW (ref 36.0–46.0)
Hemoglobin: 10 g/dL — ABNORMAL LOW (ref 12.0–15.0)
Lymphocytes Relative: 0 %
Lymphs Abs: 0 10*3/uL — ABNORMAL LOW (ref 0.7–4.0)
MCH: 28.9 pg (ref 26.0–34.0)
MCHC: 29.9 g/dL — ABNORMAL LOW (ref 30.0–36.0)
MCV: 96.5 fL (ref 80.0–100.0)
Metamyelocytes Relative: 4 %
Monocytes Absolute: 4.8 10*3/uL — ABNORMAL HIGH (ref 0.1–1.0)
Monocytes Relative: 13 %
Neutro Abs: 29.4 10*3/uL — ABNORMAL HIGH (ref 1.7–7.7)
Neutrophils Relative %: 77 %
Platelets: 268 10*3/uL (ref 150–400)
RBC: 3.46 MIL/uL — ABNORMAL LOW (ref 3.87–5.11)
RDW: 25.9 % — ABNORMAL HIGH (ref 11.5–15.5)
WBC: 37.2 10*3/uL — ABNORMAL HIGH (ref 4.0–10.5)
nRBC: 0.6 % — ABNORMAL HIGH (ref 0.0–0.2)
nRBC: 2 /100{WBCs} — ABNORMAL HIGH

## 2023-04-18 LAB — COMPREHENSIVE METABOLIC PANEL
ALT: 59 U/L — ABNORMAL HIGH (ref 0–44)
AST: 81 U/L — ABNORMAL HIGH (ref 15–41)
Albumin: 1.9 g/dL — ABNORMAL LOW (ref 3.5–5.0)
Alkaline Phosphatase: 657 U/L — ABNORMAL HIGH (ref 38–126)
Anion gap: 18 — ABNORMAL HIGH (ref 5–15)
BUN: 110 mg/dL — ABNORMAL HIGH (ref 8–23)
CO2: 16 mmol/L — ABNORMAL LOW (ref 22–32)
Calcium: 8.5 mg/dL — ABNORMAL LOW (ref 8.9–10.3)
Chloride: 103 mmol/L (ref 98–111)
Creatinine, Ser: 1.75 mg/dL — ABNORMAL HIGH (ref 0.44–1.00)
GFR, Estimated: 28 mL/min — ABNORMAL LOW (ref >60)
Glucose, Bld: 39 mg/dL — CL (ref 70–99)
Potassium: 4.2 mmol/L (ref 3.5–5.1)
Sodium: 137 mmol/L (ref 135–145)
Total Bilirubin: 6.1 mg/dL — ABNORMAL HIGH (ref 0.3–1.2)
Total Protein: 3.8 g/dL — ABNORMAL LOW (ref 6.5–8.1)

## 2023-04-18 LAB — URINALYSIS, ROUTINE W REFLEX MICROSCOPIC
Glucose, UA: 100 mg/dL — AB
Hgb urine dipstick: NEGATIVE
Ketones, ur: NEGATIVE mg/dL
Leukocytes,Ua: NEGATIVE
Nitrite: NEGATIVE
Protein, ur: NEGATIVE mg/dL
Specific Gravity, Urine: 1.025 (ref 1.005–1.030)
pH: 5.5 (ref 5.0–8.0)

## 2023-04-18 LAB — AMMONIA: Ammonia: 93 umol/L — ABNORMAL HIGH (ref 9–35)

## 2023-04-18 LAB — I-STAT CG4 LACTIC ACID, ED
Lactic Acid, Venous: 10.1 mmol/L (ref 0.5–1.9)
Lactic Acid, Venous: 10 mmol/L (ref 0.5–1.9)

## 2023-04-18 LAB — CBG MONITORING, ED
Glucose-Capillary: 26 mg/dL — CL (ref 70–99)
Glucose-Capillary: 122 mg/dL — ABNORMAL HIGH (ref 70–99)
Glucose-Capillary: 38 mg/dL — CL (ref 70–99)
Glucose-Capillary: 76 mg/dL (ref 70–99)
Glucose-Capillary: 84 mg/dL (ref 70–99)
Glucose-Capillary: 63 mg/dL — ABNORMAL LOW (ref 70–99)

## 2023-04-18 MED ORDER — SODIUM CHLORIDE 0.9 % IV SOLN
2.0000 g | INTRAVENOUS | Status: DC
  Administered 2023-04-19 (×2): 2 g via INTRAVENOUS
  Filled 2023-04-18 (×2): qty 20

## 2023-04-18 MED ORDER — POLYETHYLENE GLYCOL 3350 17 G PO PACK: 17.0000 g | PACK | Freq: Every day | ORAL | Status: DC | PRN

## 2023-04-18 MED ORDER — ONDANSETRON HCL 4 MG/2ML IJ SOLN: 4.0000 mg | Freq: Four times a day (QID) | INTRAMUSCULAR | Status: DC | PRN

## 2023-04-18 MED ORDER — MAGIC MOUTHWASH: 15.0000 mL | Freq: Four times a day (QID) | ORAL | Status: DC | PRN

## 2023-04-18 MED ORDER — SODIUM CHLORIDE 0.9 % IV SOLN: INTRAVENOUS | Status: DC

## 2023-04-18 MED ORDER — GLYCOPYRROLATE 0.2 MG/ML IJ SOLN: 0.2000 mg | INTRAMUSCULAR | Status: DC | PRN

## 2023-04-18 MED ORDER — HALOPERIDOL LACTATE 5 MG/ML IJ SOLN: 0.5000 mg | INTRAMUSCULAR | Status: DC | PRN

## 2023-04-18 MED ORDER — GLYCOPYRROLATE 1 MG PO TABS: 1.0000 mg | ORAL_TABLET | ORAL | Status: DC | PRN

## 2023-04-18 MED ORDER — MORPHINE BOLUS VIA INFUSION: 1.0000 mg | INTRAVENOUS | Status: DC | PRN

## 2023-04-18 MED ORDER — POLYVINYL ALCOHOL 1.4 % OP SOLN
1.0000 [drp] | Freq: Four times a day (QID) | OPHTHALMIC | Status: DC | PRN
  Administered 2023-04-18: 1 [drp] via OPHTHALMIC
  Filled 2023-04-18: qty 15

## 2023-04-18 MED ORDER — SODIUM CHLORIDE 0.9% FLUSH
3.0000 mL | Freq: Two times a day (BID) | INTRAVENOUS | Status: DC
  Administered 2023-04-18 – 2023-04-19 (×2): 3 mL via INTRAVENOUS

## 2023-04-18 MED ORDER — HALOPERIDOL LACTATE 2 MG/ML PO CONC: 0.5000 mg | ORAL | Status: DC | PRN

## 2023-04-18 MED ORDER — DEXTROSE 50 % IV SOLN
1.0000 | Freq: Once | INTRAVENOUS | Status: AC
  Administered 2023-04-18: 50 mL via INTRAVENOUS

## 2023-04-18 MED ORDER — ACETAMINOPHEN 650 MG RE SUPP: 650.0000 mg | Freq: Four times a day (QID) | RECTAL | Status: DC | PRN

## 2023-04-18 MED ORDER — METRONIDAZOLE 500 MG/100ML IV SOLN: 500.0000 mg | Freq: Once | INTRAVENOUS | Status: DC

## 2023-04-18 MED ORDER — LORAZEPAM 2 MG/ML PO CONC: 1.0000 mg | ORAL | Status: DC | PRN

## 2023-04-18 MED ORDER — SODIUM CHLORIDE 0.9 % IV SOLN: 12.5000 mg | Freq: Four times a day (QID) | INTRAVENOUS | Status: DC | PRN

## 2023-04-18 MED ORDER — ACETAMINOPHEN 325 MG PO TABS: 650.0000 mg | ORAL_TABLET | Freq: Four times a day (QID) | ORAL | Status: DC | PRN

## 2023-04-18 MED ORDER — ONDANSETRON 4 MG PO TBDP: 4.0000 mg | ORAL_TABLET | Freq: Four times a day (QID) | ORAL | Status: DC | PRN

## 2023-04-18 MED ORDER — SODIUM CHLORIDE 0.9 % IV SOLN
1.0000 g | Freq: Once | INTRAVENOUS | Status: AC
  Administered 2023-04-18: 1 g via INTRAVENOUS
  Filled 2023-04-18: qty 10

## 2023-04-18 MED ORDER — LORAZEPAM 2 MG/ML IJ SOLN: 1.0000 mg | INTRAMUSCULAR | Status: DC | PRN

## 2023-04-18 MED ORDER — SODIUM CHLORIDE 0.9 % IV BOLUS
1000.0000 mL | Freq: Once | INTRAVENOUS | Status: AC
  Administered 2023-04-18: 1000 mL via INTRAVENOUS

## 2023-04-18 MED ORDER — DEXTROSE 10 % IV SOLN: INTRAVENOUS | Status: DC

## 2023-04-18 MED ORDER — HALOPERIDOL 1 MG PO TABS: 0.5000 mg | ORAL_TABLET | ORAL | Status: DC | PRN

## 2023-04-18 MED ORDER — MORPHINE 100MG IN NS 100ML (1MG/ML) PREMIX INFUSION
5.0000 mg/h | INTRAVENOUS | Status: DC
  Administered 2023-04-18 – 2023-04-20 (×3): 5 mg/h via INTRAVENOUS
  Filled 2023-04-18 (×3): qty 100

## 2023-04-18 MED ORDER — DEXTROSE 50 % IV SOLN
50.0000 mL | Freq: Once | INTRAVENOUS | Status: AC
  Administered 2023-04-18: 50 mL via INTRAVENOUS
  Filled 2023-04-18: qty 50

## 2023-04-18 MED ORDER — DEXTROSE 50 % IV SOLN
25.0000 mL | Freq: Once | INTRAVENOUS | Status: AC
  Administered 2023-04-18: 25 mL via INTRAVENOUS
  Filled 2023-04-18: qty 50

## 2023-04-18 MED ORDER — LORAZEPAM 1 MG PO TABS: 1.0000 mg | ORAL_TABLET | ORAL | Status: DC | PRN

## 2023-04-18 MED ORDER — SODIUM CHLORIDE 0.9 % IV BOLUS (SEPSIS)
2500.0000 mL | Freq: Once | INTRAVENOUS | Status: DC
  Administered 2023-04-18: 1000 mL via INTRAVENOUS

## 2023-04-18 NOTE — Consult Note (Signed)
Palliative Medicine Inpatient Consult Note  Consulting Provider:  Synetta Fail, MD     Reason for consult:   Palliative Care Consult Services Palliative Medicine Consult  Reason for Consult? Patient has been made comfort care.  Presenting with continued worsened mental status, sepsis, developing liver failure, intermittent hypotension.   04/09/2023  HPI:  Per intake H&P -->  Chelsea Keller is a 88 y.o. female with medical history significant of hypertension, CKD 3B, diastolic CHF, venous insufficiency, anemia, recurrent DVT/PE, obesity, mood disorder, gout presenting after being found altered.   The palliative medicine team has been asked to get involved to assist with comfort mediated care.  Clinical Assessment/Goals of Care:  *Please note that this is a verbal dictation therefore any spelling or grammatical errors are due to the "Dragon [employer] One" system interpretation.  I have reviewed medical records including EPIC notes, labs and imaging, received report from bedside RN, assessed the patient who is lying in bed opens eyes though is disoriented.  I met with patients daughter from [place].    I introduced Palliative Medicine as specialized medical care for people living with serious illness. It focuses on providing relief from the symptoms and stress of a serious illness. The goal is to improve quality of life for both the patient and the family.  Medical History Review and Understanding:  I independently reviewed Chelsea Keller's past medical history inclusive of hypertension, chronic blood loss anemia, heart failure, chronic kidney disease, s/p right hip fracture and ORIF, arthritis, deep vein thromboses, and obesity.  Reviewed active disease burden inclusive of ongoing altered mental status.   Social History:  Chelsea Keller is originally from [place].  She moved to [place] in 2006 to live with her daughter, Chelsea Keller.  Her husband passed in 2013 of cancer.  She has 4  daughters, 10 grandchildren, and 3 great-grandchildren.  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  Chelsea Keller is a woman of faith and practices within Catholicism.  Functional and Nutritional State:  Preceding recent hospitalization Chelsea Keller required some assistance though was able to utilize her rollator for mobility.  She has a chairlift in her home to get upstairs.  When out at the grocery market she uses the ride on grocery carts.  She has had a fairly good appetite.  Since last hospital stay has declined significantly. Never got to a point of mental clarity. Has been failing with mobility and nutrition since being at [facility]   Palliative Symptoms:  Altered mental status --> ongoing since last hospitalization  Advance Directives:  A detailed discussion was had today regarding advanced directives.  Patients four daughters make decisions with one another.   Code Status:  Concepts specific to code status, artifical feeding and hydration, continued IV antibiotics and rehospitalization was had.  The difference between a aggressive medical intervention path  and a palliative comfort care path for this patient at this time was had.   Reviewed MOST:  Cardiopulmonary Resuscitation: Do Not Attempt Resuscitation (DNR/No CPR)  Medical Interventions: Limited Additional Interventions: Use medical treatment, IV fluids and cardiac monitoring as indicated, DO NOT USE intubation or mechanical ventilation. May consider use of less invasive airway support such as BiPAP or CPAP. Also provide comfort measures. Transfer to the hospital if indicated. Avoid intensive care.   Antibiotics: Antibiotics if indicated  IV Fluids: IV fluids if indicated  Feeding Tube: No feeding tube     Discussion:  Kenzee has been deteriorating  at [facility]. When staff went to see her this morning she was noted to be altered. Brought to the hospital and found to be suffering from  multi-system organ dysfunction.   We talked about transition to comfort measures in house and what that would entail inclusive of medications to control pain, dyspnea, agitation, nausea, itching, and hiccups.    We discussed stopping all uneccessary measures such as cardiac monitoring, blood draws, needle sticks, and frequent vital signs.   Utilized reflective listening throughout our time together.   Plan for comfort care. Provided education on fluids and why we would not do these indefinitely. I shared concerns for worsening of edema and volume overload. Patients daughter would like to allow them at this time though is aware if Chelsea Keller goes into volume overload they should be stopped promptly.  Two of patients daughters are coming on Tuesday and family is hoping she can live until then.   Discussed the plan to start a morphine gtt.   Decision Maker:  SUMMARY OF RECOMMENDATIONS   DNAR/DNI  Comfort Care  Start morphine gtt  IVF are used as a short term bridge to allow family to get here --> should be stopped if patient has increased resp distress  Ongoing PMT support  Code Status/Advance Care Planning: DNAR/DNI  Palliative Prophylaxis:  Aspiration, Bowel Regimen, Delirium Protocol, Frequent Pain Assessment, Oral Care, Palliative Wound Care, and Turn Reposition  Additional Recommendations (Limitations, Scope, Preferences): Comfort Care  Psycho-social/Spiritual:  Desire for further Chaplaincy support: Patient is Catholic Additional Recommendations: Education on  end of life   Prognosis: Hours to days  Discharge Planning: Discharge will likely be celestial.   Vitals:   2023/05/03 1400 05/03/2023 1611  BP: (!) 104/52 (!) 89/47  Pulse: 94 93  Resp: (!) 24 14  Temp:    SpO2: 100% 100%   Gen: Elderly Caucasian female chronically ill-appearing HEENT: Dry mucous membranes CV: Regular rate and rhythm PULM: On 2LPM, abdominal breathing ABD: soft/nontender EXT: Bilateral UE  edema,  pedal edema, cool extremties Neuro: Oriented to self and place but not clear on situation  PPS: 10%   This conversation/these recommendations were discussed with patient primary care team.  Billing based on MDM: High  Problems Addressed: One acute or chronic illness or injury that poses a threat to life or bodily function  Amount and/or Complexity of Data: Category 3:Discussion of management or test interpretation with external physician/other qualified health care professional/appropriate source (not separately reported)  Risks: Decision not to resuscitate or to de-escalate care because of poor prognosis ______________________________________________________ Lamarr Lulas [facility] Palliative Medicine Team Team Cell Phone: [phone] Please utilize secure chat with additional questions, if there is no response within 30 minutes please call the above phone number  Palliative Medicine Team providers are available by phone from 7am to 7pm daily and can be reached through the team cell phone.  Should this patient require assistance outside of these hours, please call the patient's attending physician.

## 2023-04-18 NOTE — H&P (Signed)
History and Physical   Chelsea Keller YQM:[id number] DOB: 1934/12/18 DOA: 04-24-2023  PCP: [name], MD   Patient coming from: [facility]  Chief Complaint: Acute cephalopathy  HPI: Chelsea Keller is a 88 y.o. female with medical history significant of hypertension, CKD 3B, diastolic CHF, venous insufficiency, anemia, recurrent DVT/PE, obesity, mood disorder, gout presenting after being found altered.  Patient currently residing at [facility] following admission from 9/28 until 10/10 for fall, hip and toe fracture, colitis eventually identified as C. difficile, AKI.  Was doing better on discharge with downtrending leukocytosis though it remained in the 40s and was on Dificid through 10/14.  This morning found to be unresponsive by staff at facility and brought to the ED for further evaluation.  Patient unable to participate in review of systems.  Family confirmed patient is DNR/DNI and would not want pressor support for her blood pressure either with EDP.   When I spoke with family, we further discussed that patient's altered mentation was not entirely new and that she had been somewhat altered ever since her surgery during previous admission.  He just seemed to worse for staff this morning and her blood pressure has been getting worse as well.  I confirmed with family including 2 daughters in the room and 1 by phone who is a nurse that patient is DNR/DNI and would not want pressor support.  We further discussed options including trial of antibiotics overnight versus continuing with making patient full comfort care can sit during her continue decline and now developing liver failure. We discussed that the IV antibiotics she did receive today are dosed every 24 hours and so she would not be due for another dose until middle of the day tomorrow even if we continued with them.  Family opted to make patient full comfort care and to discontinue antibiotics but continue with IV  fluids.  Family members are planning to try to come to see patient tomorrow.  Will continue with full comfort care.  ED Course: Vital signs in the ED notable for blood pressure in the 90s to 110 systolic, heart rate in the 90s to 100s.  Lab workup included CMP with bicarb 16, gap 18, BUN 110, creatinine elevated to 1.75 from baseline of 1.4, glucose 39, calcium 8.5, protein 3.8, albumin 1.9, AST 81, ALT 59, alk phos 657 up from previously 164, T. bili 6.1 up from previous of 2.4.  Leukocytosis of 37.2 which is improved from discharge however unclear trajectory, hemoglobin stable at 10.  Lactic acid elevated to 10.1 and then remained elevated at 10 on repeat.  Ammonia level 93.  Urinalysis with glucose and bilirubin only.  Blood cultures pending.  Chest x-ray with low lung volume fumes also noted was cardiomegaly and symmetric opacities consistent with edema versus atypical infection.  CT head showed no acute normality but there was motion degradation.  CT of the abdomen and pelvis showed similar appearing significant colitis and unchanged nodular contour of the liver suggesting cirrhosis.  Patient received ceftriaxone, dextrose, D10 infusion, 2 3.5 L bolus followed by additional IV fluid infusion in the ED.  Review of Systems: Patient unable to participate in review of systems.  Past Medical History:  Diagnosis Date   Allergy    Anemia    Anxiety    Arthritis    Arthritis of sacroiliac joint of both sides (HCC) 11/12/2017   Bilateral pulmonary embolism (HCC) 06/23/2017   Chronic diastolic CHF (congestive heart failure) (HCC)  a. Echo 1/16:  mild LVH, EF normal, grade 1 DD, MAC   Chronic venous insufficiency    chronic LE edema   DDD (degenerative disc disease), lumbar 11/12/2017   Degenerative joint disease (DJD) of hip, Bilateral  11/12/2017   Depression    Fibromyalgia    constant pain   Hx of cardiac catheterization    a. LHC in [place] "ok" per patient with mild plaque in a single vessel -  records not available   Hx of cardiovascular stress test    a. Nuclear study in 2008 normal   Hx of colonic polyps    Hypertension    Hypertriglyceridemia    Impaired fasting glucose    PONV (postoperative nausea and vomiting)    PUD (peptic ulcer disease)    hx of gastric ulcer   Pulmonary emboli (HCC) 06/2017   UTI (urinary tract infection) 11/12/2022   Vitamin B12 deficiency     Past Surgical History:  Procedure Laterality Date   ABDOMINAL HYSTERECTOMY     CATARACT EXTRACTION  10/2003   OD   CHOLECYSTECTOMY     COLONOSCOPY W/ POLYPECTOMY     COLONOSCOPY WITH PROPOFOL N/A 10/15/2014   Procedure: COLONOSCOPY WITH PROPOFOL;  Surgeon: Meryl Dare, MD;  Location: [facility] ENDOSCOPY;  Service: Endoscopy;  Laterality: N/A;   COLONOSCOPY WITH PROPOFOL N/A 03/23/2018   Procedure: COLONOSCOPY WITH PROPOFOL;  Surgeon: Iva Boop, MD;  Location: [facility] ENDOSCOPY;  Service: Endoscopy;  Laterality: N/A;   ESOPHAGOGASTRODUODENOSCOPY (EGD) WITH PROPOFOL N/A 10/15/2014   Procedure: ESOPHAGOGASTRODUODENOSCOPY (EGD) WITH PROPOFOL;  Surgeon: Meryl Dare, MD;  Location: [facility] ENDOSCOPY;  Service: Endoscopy;  Laterality: N/A;   EXTERNAL FIXATION ANKLE FRACTURE     Fx.  left ankle-fixation with pins later removed sec to infection 1985   EXTERNAL FIXATION WRIST FRACTURE  1985   left with pins   EYE SURGERY     FEMUR FRACTURE SURGERY  06/2009   FRACTURE SURGERY     HARDWARE REMOVAL Left 10/05/2013   Procedure: HARDWARE REMOVAL LEFT DISTAL FEMUR;  Surgeon: Budd Palmer, MD;  Location: [facility] OR;  Service: Orthopedics;  Laterality: Left;   HEMIARTHROPLASTY SHOULDER FRACTURE  06/2009   HOT HEMOSTASIS N/A 03/23/2018   Procedure: HOT HEMOSTASIS (ARGON PLASMA COAGULATION/BICAP);  Surgeon: Iva Boop, MD;  Location: [facility] ENDOSCOPY;  Service: Endoscopy;  Laterality: N/A;   JOINT REPLACEMENT     ORIF FEMUR FRACTURE Left 04/05/2023   Procedure: OPEN REDUCTION INTERNAL FIXATION (ORIF) DISTAL FEMUR FRACTURE;   Surgeon: Roby Lofts, MD;  Location: [facility] OR;  Service: Orthopedics;  Laterality: Left;   STERIOD INJECTION Right 10/05/2013   Procedure: STEROID INJECTION;  Surgeon: Budd Palmer, MD;  Location: [facility] OR;  Service: Orthopedics;  Laterality: Right;   TONSILLECTOMY     TONSILLECTOMY     TOTAL KNEE ARTHROPLASTY  03/09   left    Social History  reports that she quit smoking about 29 years ago. Her smoking use included cigarettes. She started smoking about 69 years ago. She has a 40 pack-year smoking history. She has been exposed to tobacco smoke. She has never used smokeless tobacco. She reports that she does not drink alcohol and does not use drugs.  Allergies  Allergen Reactions   Codeine Sulfate Shortness Of Breath   Celecoxib Other (See Comments)    Caused vaginal bleeding   Erythromycin Base Nausea And Vomiting   Spironolactone Nausea Only and Other (See Comments)    Blurred vision, Upset  stomach, lethargy    Family History  Problem Relation Age of Onset   Depression Mother    Cancer Mother        uterine cancer   Heart attack Father    Cancer Brother        prostate cancer   Diabetes Maternal Aunt    Arthritis Brother    Asthma Brother    Stroke Maternal Grandmother    Pulmonary embolism Daughter   Reviewed on admission  Prior to Admission medications   Medication Sig Start Date End Date Taking? Authorizing Provider  acetaminophen (TYLENOL) 500 MG tablet Take 1,500 mg by mouth every 12 (twelve) hours as needed (for pain- with each dose of Tramadol).   Yes [provider]  allopurinol (ZYLOPRIM) 300 MG tablet TAKE 1 TABLET BY MOUTH EVERY DAY 01/11/23  Yes Tillman Abide I, MD  amLODipine (NORVASC) 5 MG tablet Take 1 tablet (5 mg total) by mouth daily. 04/16/23  Yes [place], Eric J, DO  colchicine 0.6 MG tablet Take 1 tablet (0.6 mg total) by mouth 2 (two) times daily. 12/08/22  Yes [name], MD  ELIQUIS 2.5 MG TABS tablet TAKE 1 TABLET TWICE A DAY 01/21/23   Yes [name], MD  fidaxomicin (DIFICID) 200 MG TABS tablet Take 1 tablet (200 mg total) by mouth 2 (two) times daily for 3 days. 04/15/23 2023/05/11 Yes [place], Eric J, DO  gabapentin (NEURONTIN) 300 MG capsule TAKE 1 CAPSULE IN THE MORNING AND 2 CAPSULES AT BEDTIME Patient taking differently: Take 300-600 mg by mouth See admin instructions. Take 300 mg by mouth in the morning and 600 mg with supper 07/27/22  Yes [name], MD  hydrOXYzine (ATARAX) 10 MG tablet TAKE 1 TABLET BY MOUTH THREE TIMES A DAY AS NEEDED Patient taking differently: Take 10 mg by mouth daily as needed for itching. 02/24/23  Yes [name], MD  KLOR-CON M20 20 MEQ tablet TAKE 2 TABLETS (40 MEQ TOTAL) DAILY Patient taking differently: Take 40 mEq by mouth in the morning. 04/28/22  Yes [name], MD  methocarbamol (ROBAXIN) 500 MG tablet Take 1 tablet (500 mg total) by mouth every 6 (six) hours as needed for muscle spasms. 04/07/23  Yes McClung, Sarah A, PA-C  ondansetron (ZOFRAN) 4 MG tablet Take 1 tablet (4 mg total) by mouth every 8 (eight) hours as needed for nausea or vomiting. 03/15/23  Yes [name], MD  ondansetron (ZOFRAN-ODT) 4 MG disintegrating tablet Take 4 mg by mouth every 8 (eight) hours as needed. 04/15/23  Yes [provider]  oxyCODONE (OXY IR/ROXICODONE) 5 MG immediate release tablet Take 1 tablet (5 mg total) by mouth every 4 (four) hours as needed for severe pain (pain score 7-10). 04/07/23  Yes McClung, Sarah A, PA-C  fluconazole (DIFLUCAN) 150 MG tablet Take 1 tablet (150 mg total) by mouth once a week. Patient taking differently: Take 150 mg by mouth once a week. thursdays 03/31/23   [name], MD  Vitamin D, Ergocalciferol, (DRISDOL) 1.25 MG (50000 UNIT) CAPS capsule Take 1 capsule (50,000 Units total) by mouth every 7 (seven) days. Patient taking differently: Take 50,000 Units by mouth every 7 (seven) days. thursday 04/14/23   [place], PA-C     Physical Exam: Vitals:   05-11-23 1130 05/11/2023 1145 05/11/23 1148 May 11, 2023 1202  BP: (!) 113/58 (!) 118/105    Pulse: 67 96 100   Resp: (!) 24 15 19    Temp:    Marland Kitchen)  96 F (35.6 C)  TempSrc:    Tympanic  SpO2: 100% 100% 100%     Physical Exam Constitutional:      General: She is not in acute distress.    Appearance: She is obese. She is ill-appearing and toxic-appearing.  HENT:     Head: Normocephalic and atraumatic.     Mouth/Throat:     Mouth: Mucous membranes are moist.     Pharynx: Oropharynx is clear.  Eyes:     Extraocular Movements: Extraocular movements intact.     Pupils: Pupils are equal, round, and reactive to light.  Cardiovascular:     Rate and Rhythm: Normal rate and regular rhythm.     Pulses: Normal pulses.     Heart sounds: Normal heart sounds.  Pulmonary:     Effort: No respiratory distress.     Breath sounds: Normal breath sounds.     Comments: Some belly breathing Abdominal:     General: Bowel sounds are normal. There is no distension.     Palpations: Abdomen is soft.     Tenderness: There is no abdominal tenderness.  Musculoskeletal:        General: No swelling or deformity.  Skin:    General: Skin is warm and dry.  Neurological:     Mental Status: She is disoriented.     Comments: Intermittently alert to voice.  Family states that she has some/mumbled some but not consistently.    Labs on Admission: I have personally reviewed following labs and imaging studies  CBC: Recent Labs  Lab 04/12/23 0519 04/13/23 0801 04/14/23 0605 04/15/23 0753 04-29-2023 0942  WBC 48.6* 45.9* 44.8* 44.1* 37.2*  NEUTROABS  --   --   --   --  29.4*  HGB 10.6* 10.1* 10.3* 10.3* 10.0*  HCT 34.6* 31.4* 33.4* 32.7* 33.4*  MCV 89.6 85.3 89.5 90.6 96.5  PLT 155 164 222 220 268    Basic Metabolic Panel: Recent Labs  Lab 04/12/23 0519 04/14/23 0605 04/15/23 0753 2023/04/29 0942  NA 133* 133* 135 137  K 3.8 4.5 3.8 4.2  CL 98 98 99 103  CO2 19* 22 21* 16*   GLUCOSE 98 110* 106* 39*  BUN 90* 89* 89* 110*  CREATININE 1.16* 1.04* 1.05* 1.75*  CALCIUM 8.1* 8.3* 8.5* 8.5*    GFR: CrCl cannot be calculated (Unknown ideal weight.).  Liver Function Tests: Recent Labs  Lab 04-29-23 0942  AST 81*  ALT 59*  ALKPHOS 657*  BILITOT 6.1*  PROT 3.8*  ALBUMIN 1.9*    Urine analysis:    Component Value Date/Time   COLORURINE AMBER (A) 04-29-23 1112   APPEARANCEUR CLEAR 2023-04-29 1112   LABSPEC 1.025 29-Apr-2023 1112   PHURINE 5.5 04-29-2023 1112   GLUCOSEU 100 (A) 04-29-2023 1112   HGBUR NEGATIVE 04-29-2023 1112   HGBUR negative 01/13/2010 1056   BILIRUBINUR SMALL (A) Apr 29, 2023 1112   BILIRUBINUR 1+ 03/15/2023 1422   KETONESUR NEGATIVE Apr 29, 2023 1112   PROTEINUR NEGATIVE April 29, 2023 1112   UROBILINOGEN 0.2 03/15/2023 1422   UROBILINOGEN 0.2 01/13/2010 1056   NITRITE NEGATIVE Apr 29, 2023 1112   LEUKOCYTESUR NEGATIVE 29-Apr-2023 1112    Radiological Exams on Admission: CT ABDOMEN PELVIS WO CONTRAST  Result Date: April 29, 2023 CLINICAL DATA:  Altered mental status. Elevated LFTs and acute kidney injury. EXAM: CT ABDOMEN AND PELVIS WITHOUT CONTRAST TECHNIQUE: Multidetector CT imaging of the abdomen and pelvis was performed following the standard protocol without IV contrast. RADIATION DOSE REDUCTION: This exam was performed according to  the departmental dose-optimization program which includes automated exposure control, adjustment of the mA and/or kV according to patient size and/or use of iterative reconstruction technique. COMPARISON:  CT abdomen pelvis dated April 07, 2023. FINDINGS: Lower chest: Trace right pleural effusion. Hepatobiliary: No focal liver abnormality is seen. Slightly nodular left liver contour is unchanged. Status post cholecystectomy. No biliary dilatation. Pancreas: Unremarkable. No pancreatic ductal dilatation or surrounding inflammatory changes. Spleen: Normal in size without focal abnormality. Adrenals/Urinary Tract:  Unchanged 1.1 cm left adrenal nodule previously characterized as an adenoma, benign. No follow-up imaging is recommended. Normal right adrenal gland. Unchanged bilateral renal vascular calcifications. No calculi or hydronephrosis. Bladder is unremarkable for the degree of distention. Stomach/Bowel: The stomach and small bowel are unremarkable. Diffuse colonic diverticulosis again noted. Similar diffuse colonic wall thickening with mild pericolonic inflammatory stranding involving the ascending and descending colon. Normal appendix. Vascular/Lymphatic: Aortic atherosclerosis. No enlarged abdominal or pelvic lymph nodes. Reproductive: Status post hysterectomy. No adnexal masses. Other: No free fluid or pneumoperitoneum. Unchanged body wall edema. Musculoskeletal: No acute or significant osseous findings. Chronic L4 compression deformity again noted. IMPRESSION: 1. Similar diffuse colonic wall thickening with mild pericolonic inflammatory stranding involving the ascending and descending colon, consistent with colitis. 2. Unchanged slightly nodular left liver contour. Correlate for cirrhosis. 3. Trace right pleural effusion. 4.  Aortic Atherosclerosis (ICD10-I70.0). Electronically Signed   By: Obie Dredge M.D.   On: 04/10/2023 13:12   DG Chest 1 View  Result Date: 04/12/2023 CLINICAL DATA:  88 year old female found unresponsive. Responsive only to pain. EXAM: CHEST  1 VIEW COMPARISON:  CT Chest, Abdomen, and Pelvis today are reported separately. 04/03/2023 and earlier. Portable AP semi upright view at 1017 hours. Low lung volumes. Cardiomegaly. Calcified aortic atherosclerosis. Visualized tracheal air column is within normal limits. Symmetric increased pulmonary interstitial opacity, new from last month. No pneumothorax, pleural effusion, consolidation. Chronic severe bony changes at the right shoulder. Stable cholecystectomy clips. Paucity of bowel gas in the upper abdomen. FINDINGS: The heart size and  mediastinal contours are within normal limits. Both lungs are clear. The visualized skeletal structures are unremarkable. IMPRESSION: Low lung volumes with cardiomegaly and new symmetric interstitial opacity suspicious for pulmonary edema. Viral/atypical respiratory infection felt less likely. No pleural effusion is evident. Electronically Signed   By: Odessa Fleming M.D.   On: 04/09/2023 10:43   CT Head Wo Contrast  Result Date: 04/09/2023 CLINICAL DATA:  88 year old female with altered mental status, responsive only to pain. Found unresponsive this morning. EXAM: CT HEAD WITHOUT CONTRAST TECHNIQUE: Contiguous axial images were obtained from the base of the skull through the vertex without intravenous contrast. RADIATION DOSE REDUCTION: This exam was performed according to the departmental dose-optimization program which includes automated exposure control, adjustment of the mA and/or kV according to patient size and/or use of iterative reconstruction technique. COMPARISON:  Brain MRI 11/13/2022.  Head CT 04/03/2023. FINDINGS: Study is intermittently degraded by motion artifact despite repeated imaging attempts. Brain: Cerebral volume remains normal for age. No midline shift, ventriculomegaly, mass effect, evidence of mass lesion, intracranial hemorrhage or evidence of cortically based acute infarction. Gray-white differentiation appears stable and within normal limits for age. Vascular: No suspicious intracranial vascular hyperdensity. Calcified atherosclerosis at the skull base. Skull: Stable, No acute osseous abnormality identified. Sinuses/Orbits: Visualized paranasal sinuses and mastoids are stable and well aerated. Other: No acute orbit or scalp soft tissue finding. IMPRESSION: Motion degraded exam but noncontrast CT appearance of the brain appears stable and normal for age.  Electronically Signed   By: Odessa Fleming M.D.   On: 04/22/2023 10:42    EKG: Independently reviewed.  Sinus tachycardia 113 bpm.  Low voltage  multiple leads.  PVCs noted.  QTc 491.  QRS 103.  Assessment/Plan Principal Problem:   Acute encephalopathy Active Problems:   Essential hypertension   Venous (peripheral) insufficiency   Morbid obesity (HCC)   Chronic diastolic CHF (congestive heart failure) (HCC)   Chronic kidney disease, stage 3a (HCC)   Symptomatic anemia   Gout   Recurrent pulmonary emboli (HCC)   Recurrent deep vein thrombosis (DVT) (HCC)   Acute encephalopathy Hyperglycemia Elevated ammonia Hypotension Acute liver failure Sepsis > Patient presented with acute cephalopathy. > Possible multiple etiologies given hypoglycemia, ammonia, sepsis, hypotension.  > Mentation minimally improved with glucose administration.  Also noted to have ammonia of 93 in the setting of new liver failure with LFTs currently: AST 91, ALT 59, alk phos 657, T. bili 6.1. > Suspicious for septic shock and shock liver given persistent leukocytosis of 37 though this is improved from discharge 3 days ago unclear trajectory.  And now with a lactic acid persistently at 10. > Source for sepsis at this time is unidentified however continued to have significant colitis despite treatment for C. difficile.  Possible bloodstream infection.  Blood cultures are pending.  Urinalysis with glucose and bilirubin only.  Chest x-ray with low lung volumes and some metrical opacities more suspicious for edema or atypical infection. > Patient confirmed to be DNI/DNR in ED by family.  Family also confirmed that they would not like to pursue pressor support with EDP, in the setting of concern for sepsis, declining blood pressure. ADDENDUM > As per updated HPI patient family has opted to make patient full comfort care.  2 daughters were present in the room for discussion and additional daughter who is a Engineer, civil (consulting) was available by phone. Comfort Care - Admit to palliative unit - Consult to palliative care - Continue with IV fluids - Discontinue antibiotics -  Discontinue telemetry monitoring - Discontinue laboratory monitoring - As needed pain medication with continuous morphine infusion - As needed medication for anxiety, agitation - As needed medication for secretions - Supportive care  Elevated lactic acid > Presumably due to sepsis as above.  Had bloody stool but hard to rule out ischemic colitis given her thromboembolic history and degree of colitis on imaging. > Patient made full comfort care as above.  AKI on CKD 3B > Creatinine elevated to 1.75 and baseline 1.4. - Continue IV fluids as above - Otherwise, comfort care  Diastolic CHF > Last echo in 2020 with EF 60-65%, G1 DD.  Not currently on any diuretic.  > Significant IV fluids for developing sepsis/shock as above. - IV fluids as above, otherwise comfort care  DVT prophylaxis: None Code Status:   DNR/DNI, comfort care Family Communication:  Updated at bedside and by phone Disposition Plan:   Patient is from:  Home  Anticipated DC to:  In-hospital death versus hospice  Anticipated DC date:  2 to 3 days  Anticipated DC barriers: None  Consults called:  Consult order placed to palliative care Admission status:  Inpatient, comfort/palliative  Severity of Illness: The appropriate patient status for this patient is INPATIENT. Inpatient status is judged to be reasonable and necessary in order to provide the required intensity of service to ensure the patient's safety. The patient's presenting symptoms, physical exam findings, and initial radiographic and laboratory data in the context of their  chronic comorbidities is felt to place them at high risk for further clinical deterioration. Furthermore, it is not anticipated that the patient will be medically stable for discharge from the hospital within 2 midnights of admission.   * I certify that at the point of admission it is my clinical judgment that the patient will require inpatient hospital care spanning beyond 2 midnights from the  point of admission due to high intensity of service, high risk for further deterioration and high frequency of surveillance required.Synetta Fail MD [facility]  How to contact the [facility] Attending or Consulting provider 7A - 7P or covering provider during after hours 7P -7A, for this patient?   Check the care team in [facility] and look for a) attending/consulting [facility] provider listed and b) the [facility] team listed Log into [email or web address] and use [place]'s universal password to access. If you do not have the password, please contact the hospital operator. Locate the [facility] provider you are looking for under [facility] and page to a number that you can be directly reached. If you still have difficulty reaching the provider, please page the [facility] (Director on Call) for the Hospitalists listed on amion for assistance.  May 18, 2023, 1:52 PM

## 2023-04-18 NOTE — Progress Notes (Addendum)
Pharmacy is informing that patient's blood culture growing Klebsiella pneumoniae in both sets of blood cultures. Per chart review patient has been admitted earlier today for evaluation for acute metabolic encephalopathy, hyperglycemia, hyperammonemia, hypotension, acute hepatic failure and sepsis.  Palliative team has been evaluated patient and comfort care has been initiated.  Per palliative care consult note patient is DNR and DNI and currently on comfort care here however patient's family wants to continue IV fluid and antibiotic if indicated.   Klebsiella pneumonia bacteremia Sepsis - Per palliative care note can initiate IV fluid and IV antibiotic if indicated - Patient has elevated lactic acid 10.  And leukocytosis 37.2 -In the ED patient has been resuscitated with 1 L of LR bolus.  Currently on D5W 100 cc/h.  -Initiating ceftriaxone 2 g IV daily. -Patient's prognosis is very guarded.  Currently on comfort care.  Anticipating death during this hospital admission.  Tereasa Coop, MD [facility] 2023/05/11, 11:29 PM

## 2023-04-18 NOTE — Sepsis Progress Note (Signed)
Sepsis protocol monitored by eLink ?

## 2023-04-18 NOTE — Progress Notes (Signed)
PHARMACY - PHYSICIAN COMMUNICATION CRITICAL VALUE ALERT - BLOOD CULTURE IDENTIFICATION (BCID)  Chelsea Keller is an 88 y.o. female who presented to [facility] on 04/17/2023 with a chief complaint of unresponsiveness.  Assessment: 2/2 Bcx growing klebsiella pneumoniae with no gene resistance  Name of physician (or Provider) Contacted: Dr. Janalyn Shy  Current antibiotics: None  Changes to prescribed antibiotics recommended:   -Start ceftriaxone 2g IV every 24 hours   Results for orders placed or performed during the hospital encounter of 04/14/2023  Blood Culture ID Panel (Reflexed) (Collected: 05/05/2023 10:54 AM)  Result Value Ref Range   Enterococcus faecalis NOT DETECTED NOT DETECTED   Enterococcus Faecium NOT DETECTED NOT DETECTED   Listeria monocytogenes NOT DETECTED NOT DETECTED   Staphylococcus species NOT DETECTED NOT DETECTED   Staphylococcus aureus (BCID) NOT DETECTED NOT DETECTED   Staphylococcus epidermidis NOT DETECTED NOT DETECTED   Staphylococcus lugdunensis NOT DETECTED NOT DETECTED   Streptococcus species NOT DETECTED NOT DETECTED   Streptococcus agalactiae NOT DETECTED NOT DETECTED   Streptococcus pneumoniae NOT DETECTED NOT DETECTED   Streptococcus pyogenes NOT DETECTED NOT DETECTED   A.calcoaceticus-baumannii NOT DETECTED NOT DETECTED   Bacteroides fragilis NOT DETECTED NOT DETECTED   Enterobacterales DETECTED (A) NOT DETECTED   Enterobacter cloacae complex NOT DETECTED NOT DETECTED   Escherichia coli NOT DETECTED NOT DETECTED   Klebsiella aerogenes NOT DETECTED NOT DETECTED   Klebsiella oxytoca NOT DETECTED NOT DETECTED   Klebsiella pneumoniae DETECTED (A) NOT DETECTED   Proteus species NOT DETECTED NOT DETECTED   Salmonella species NOT DETECTED NOT DETECTED   Serratia marcescens NOT DETECTED NOT DETECTED   Haemophilus influenzae NOT DETECTED NOT DETECTED   Neisseria meningitidis NOT DETECTED NOT DETECTED   Pseudomonas aeruginosa NOT DETECTED NOT DETECTED    Stenotrophomonas maltophilia NOT DETECTED NOT DETECTED   Candida albicans NOT DETECTED NOT DETECTED   Candida auris NOT DETECTED NOT DETECTED   Candida glabrata NOT DETECTED NOT DETECTED   Candida krusei NOT DETECTED NOT DETECTED   Candida parapsilosis NOT DETECTED NOT DETECTED   Candida tropicalis NOT DETECTED NOT DETECTED   Cryptococcus neoformans/gattii NOT DETECTED NOT DETECTED   CTX-M ESBL NOT DETECTED NOT DETECTED   Carbapenem resistance IMP NOT DETECTED NOT DETECTED   Carbapenem resistance KPC NOT DETECTED NOT DETECTED   Carbapenem resistance NDM NOT DETECTED NOT DETECTED   Carbapenem resist OXA 48 LIKE NOT DETECTED NOT DETECTED   Carbapenem resistance VIM NOT DETECTED NOT DETECTED    Arabella Merles, PharmD. Clinical Pharmacist 04/23/2023 11:24 PM

## 2023-04-18 NOTE — ED Provider Notes (Signed)
recommended. Normal right adrenal gland. Unchanged bilateral renal vascular  calcifications. No calculi or hydronephrosis. Bladder is unremarkable for the degree of distention. Stomach/Bowel: The stomach and small bowel are unremarkable. Diffuse colonic diverticulosis again noted. Similar diffuse colonic wall thickening with mild pericolonic inflammatory stranding involving the ascending and descending colon. Normal appendix. Vascular/Lymphatic: Aortic atherosclerosis. No enlarged abdominal or pelvic lymph nodes. Reproductive: Status post hysterectomy. No adnexal masses. Other: No free fluid or pneumoperitoneum. Unchanged body wall edema. Musculoskeletal: No acute or significant osseous findings. Chronic L4 compression deformity again noted. IMPRESSION: 1. Similar diffuse colonic wall thickening with mild pericolonic inflammatory stranding involving the ascending and descending colon, consistent with colitis. 2. Unchanged slightly nodular left liver contour. Correlate for cirrhosis. 3. Trace right pleural effusion. 4.  Aortic Atherosclerosis (ICD10-I70.0). Electronically Signed   By: Obie Dredge M.D.   On: 04/13/2023 13:12   DG Chest 1 View  Result Date: 05/05/2023 CLINICAL DATA:  88 year old female found unresponsive. Responsive only to pain. EXAM: CHEST  1 VIEW COMPARISON:  CT Chest, Abdomen, and Pelvis today are reported separately. 04/03/2023 and earlier. Portable AP semi upright view at 1017 hours. Low lung volumes. Cardiomegaly. Calcified aortic atherosclerosis. Visualized tracheal air column is within normal limits. Symmetric increased pulmonary interstitial opacity, new from last month. No pneumothorax, pleural effusion, consolidation. Chronic severe bony changes at the right shoulder. Stable cholecystectomy clips. Paucity of bowel gas in the upper abdomen. FINDINGS: The heart size and mediastinal contours are within normal limits. Both lungs are clear. The visualized skeletal structures are unremarkable. IMPRESSION: Low lung volumes with cardiomegaly and new symmetric  interstitial opacity suspicious for pulmonary edema. Viral/atypical respiratory infection felt less likely. No pleural effusion is evident. Electronically Signed   By: Odessa Fleming M.D.   On: 04/21/2023 10:43   CT Head Wo Contrast  Result Date: 05/01/2023 CLINICAL DATA:  88 year old female with altered mental status, responsive only to pain. Found unresponsive this morning. EXAM: CT HEAD WITHOUT CONTRAST TECHNIQUE: Contiguous axial images were obtained from the base of the skull through the vertex without intravenous contrast. RADIATION DOSE REDUCTION: This exam was performed according to the departmental dose-optimization program which includes automated exposure control, adjustment of the mA and/or kV according to patient size and/or use of iterative reconstruction technique. COMPARISON:  Brain MRI 11/13/2022.  Head CT 04/03/2023. FINDINGS: Study is intermittently degraded by motion artifact despite repeated imaging attempts. Brain: Cerebral volume remains normal for age. No midline shift, ventriculomegaly, mass effect, evidence of mass lesion, intracranial hemorrhage or evidence of cortically based acute infarction. Gray-white differentiation appears stable and within normal limits for age. Vascular: No suspicious intracranial vascular hyperdensity. Calcified atherosclerosis at the skull base. Skull: Stable, No acute osseous abnormality identified. Sinuses/Orbits: Visualized paranasal sinuses and mastoids are stable and well aerated. Other: No acute orbit or scalp soft tissue finding. IMPRESSION: Motion degraded exam but noncontrast CT appearance of the brain appears stable and normal for age. Electronically Signed   By: Odessa Fleming M.D.   On: 04/26/2023 10:42    Procedures .Critical Care  Performed by: Linwood Dibbles, MD Authorized by: Linwood Dibbles, MD   Critical care provider statement:    Critical care time (minutes):  65   Critical care was time spent personally by me on the following activities:  Development  of treatment plan with patient or surrogate, discussions with consultants, evaluation of patient's response to treatment, examination of patient, ordering and review of laboratory studies, ordering and review of radiographic studies, ordering and  [facility] Provider Note   CSN: [id number] Arrival date & time: 05/01/2023  4098     History {Add pertinent medical, surgical, social history, OB history to HPI:1} Chief Complaint  Patient presents with  . Altered Mental Status    Chelsea Keller is a 88 y.o. female.   Altered Mental Status    Patient has a history of chronic venous sufficiency hypertension arthritis fibromyalgia B12 deficiency, CHF, degenerative joint disease.  Patient is a resident of a nursing facility.  Patient is a resident of [facility].  She was found unresponsive this morning.  Patient is DNR.  She has a MOST form that indicates no intubation although BiPAP is okay.  No CPR.  Patient unable to answer any questions.  On arrival to the ED her blood sugar is noted to be 38 Eckerd's were reviewed.  Patient was admitted to the hospital on September 28.  She was discharged on October 10.  Patient had been admitted to the hospital initially after a fall.  Patient was found to have a femur fracture.  Patient underwent open reduction internal fixation.  Patient ended up having some complications of colitis and she was started on IV antibiotics.  Patient was noted to have persistent leukocytosis.  Patient was started on Dificid.  She was discharged to a nursing facility. Family states prior to hospitalization.  She was alert and talkative.  Patient was living at home.  Home Medications Prior to Admission medications   Medication Sig Start Date End Date Taking? Authorizing Provider  acetaminophen (TYLENOL) 500 MG tablet Take 1,500 mg by mouth every 12 (twelve) hours as needed (for pain- with each dose of Tramadol).   Yes [provider]  allopurinol (ZYLOPRIM) 300 MG tablet TAKE 1 TABLET BY MOUTH EVERY DAY 01/11/23  Yes Tillman Abide I, MD  amLODipine (NORVASC) 5 MG tablet Take 1 tablet (5 mg total) by mouth daily. 04/16/23  Yes [place], Eric J, DO  colchicine 0.6 MG  tablet Take 1 tablet (0.6 mg total) by mouth 2 (two) times daily. 12/08/22  Yes [name], MD  ELIQUIS 2.5 MG TABS tablet TAKE 1 TABLET TWICE A DAY 01/21/23  Yes [name], MD  fidaxomicin (DIFICID) 200 MG TABS tablet Take 1 tablet (200 mg total) by mouth 2 (two) times daily for 3 days. 04/15/23 05/06/2023 Yes [place], Eric J, DO  gabapentin (NEURONTIN) 300 MG capsule TAKE 1 CAPSULE IN THE MORNING AND 2 CAPSULES AT BEDTIME Patient taking differently: Take 300-600 mg by mouth See admin instructions. Take 300 mg by mouth in the morning and 600 mg with supper 07/27/22  Yes [name], MD  hydrOXYzine (ATARAX) 10 MG tablet TAKE 1 TABLET BY MOUTH THREE TIMES A DAY AS NEEDED Patient taking differently: Take 10 mg by mouth daily as needed for itching. 02/24/23  Yes [name], MD  KLOR-CON M20 20 MEQ tablet TAKE 2 TABLETS (40 MEQ TOTAL) DAILY Patient taking differently: Take 40 mEq by mouth in the morning. 04/28/22  Yes [name], MD  methocarbamol (ROBAXIN) 500 MG tablet Take 1 tablet (500 mg total) by mouth every 6 (six) hours as needed for muscle spasms. 04/07/23  Yes McClung, Sarah A, PA-C  ondansetron (ZOFRAN) 4 MG tablet Take 1 tablet (4 mg total) by mouth every 8 (eight) hours as needed for nausea or vomiting. 03/15/23  Yes [name], MD  ondansetron (ZOFRAN-ODT) 4 MG disintegrating tablet Take 4  recommended. Normal right adrenal gland. Unchanged bilateral renal vascular  calcifications. No calculi or hydronephrosis. Bladder is unremarkable for the degree of distention. Stomach/Bowel: The stomach and small bowel are unremarkable. Diffuse colonic diverticulosis again noted. Similar diffuse colonic wall thickening with mild pericolonic inflammatory stranding involving the ascending and descending colon. Normal appendix. Vascular/Lymphatic: Aortic atherosclerosis. No enlarged abdominal or pelvic lymph nodes. Reproductive: Status post hysterectomy. No adnexal masses. Other: No free fluid or pneumoperitoneum. Unchanged body wall edema. Musculoskeletal: No acute or significant osseous findings. Chronic L4 compression deformity again noted. IMPRESSION: 1. Similar diffuse colonic wall thickening with mild pericolonic inflammatory stranding involving the ascending and descending colon, consistent with colitis. 2. Unchanged slightly nodular left liver contour. Correlate for cirrhosis. 3. Trace right pleural effusion. 4.  Aortic Atherosclerosis (ICD10-I70.0). Electronically Signed   By: Obie Dredge M.D.   On: 04/13/2023 13:12   DG Chest 1 View  Result Date: 05/05/2023 CLINICAL DATA:  88 year old female found unresponsive. Responsive only to pain. EXAM: CHEST  1 VIEW COMPARISON:  CT Chest, Abdomen, and Pelvis today are reported separately. 04/03/2023 and earlier. Portable AP semi upright view at 1017 hours. Low lung volumes. Cardiomegaly. Calcified aortic atherosclerosis. Visualized tracheal air column is within normal limits. Symmetric increased pulmonary interstitial opacity, new from last month. No pneumothorax, pleural effusion, consolidation. Chronic severe bony changes at the right shoulder. Stable cholecystectomy clips. Paucity of bowel gas in the upper abdomen. FINDINGS: The heart size and mediastinal contours are within normal limits. Both lungs are clear. The visualized skeletal structures are unremarkable. IMPRESSION: Low lung volumes with cardiomegaly and new symmetric  interstitial opacity suspicious for pulmonary edema. Viral/atypical respiratory infection felt less likely. No pleural effusion is evident. Electronically Signed   By: Odessa Fleming M.D.   On: 04/21/2023 10:43   CT Head Wo Contrast  Result Date: 05/01/2023 CLINICAL DATA:  88 year old female with altered mental status, responsive only to pain. Found unresponsive this morning. EXAM: CT HEAD WITHOUT CONTRAST TECHNIQUE: Contiguous axial images were obtained from the base of the skull through the vertex without intravenous contrast. RADIATION DOSE REDUCTION: This exam was performed according to the departmental dose-optimization program which includes automated exposure control, adjustment of the mA and/or kV according to patient size and/or use of iterative reconstruction technique. COMPARISON:  Brain MRI 11/13/2022.  Head CT 04/03/2023. FINDINGS: Study is intermittently degraded by motion artifact despite repeated imaging attempts. Brain: Cerebral volume remains normal for age. No midline shift, ventriculomegaly, mass effect, evidence of mass lesion, intracranial hemorrhage or evidence of cortically based acute infarction. Gray-white differentiation appears stable and within normal limits for age. Vascular: No suspicious intracranial vascular hyperdensity. Calcified atherosclerosis at the skull base. Skull: Stable, No acute osseous abnormality identified. Sinuses/Orbits: Visualized paranasal sinuses and mastoids are stable and well aerated. Other: No acute orbit or scalp soft tissue finding. IMPRESSION: Motion degraded exam but noncontrast CT appearance of the brain appears stable and normal for age. Electronically Signed   By: Odessa Fleming M.D.   On: 04/26/2023 10:42    Procedures .Critical Care  Performed by: Linwood Dibbles, MD Authorized by: Linwood Dibbles, MD   Critical care provider statement:    Critical care time (minutes):  65   Critical care was time spent personally by me on the following activities:  Development  of treatment plan with patient or surrogate, discussions with consultants, evaluation of patient's response to treatment, examination of patient, ordering and review of laboratory studies, ordering and review of radiographic studies, ordering and  recommended. Normal right adrenal gland. Unchanged bilateral renal vascular  calcifications. No calculi or hydronephrosis. Bladder is unremarkable for the degree of distention. Stomach/Bowel: The stomach and small bowel are unremarkable. Diffuse colonic diverticulosis again noted. Similar diffuse colonic wall thickening with mild pericolonic inflammatory stranding involving the ascending and descending colon. Normal appendix. Vascular/Lymphatic: Aortic atherosclerosis. No enlarged abdominal or pelvic lymph nodes. Reproductive: Status post hysterectomy. No adnexal masses. Other: No free fluid or pneumoperitoneum. Unchanged body wall edema. Musculoskeletal: No acute or significant osseous findings. Chronic L4 compression deformity again noted. IMPRESSION: 1. Similar diffuse colonic wall thickening with mild pericolonic inflammatory stranding involving the ascending and descending colon, consistent with colitis. 2. Unchanged slightly nodular left liver contour. Correlate for cirrhosis. 3. Trace right pleural effusion. 4.  Aortic Atherosclerosis (ICD10-I70.0). Electronically Signed   By: Obie Dredge M.D.   On: 04/13/2023 13:12   DG Chest 1 View  Result Date: 05/05/2023 CLINICAL DATA:  88 year old female found unresponsive. Responsive only to pain. EXAM: CHEST  1 VIEW COMPARISON:  CT Chest, Abdomen, and Pelvis today are reported separately. 04/03/2023 and earlier. Portable AP semi upright view at 1017 hours. Low lung volumes. Cardiomegaly. Calcified aortic atherosclerosis. Visualized tracheal air column is within normal limits. Symmetric increased pulmonary interstitial opacity, new from last month. No pneumothorax, pleural effusion, consolidation. Chronic severe bony changes at the right shoulder. Stable cholecystectomy clips. Paucity of bowel gas in the upper abdomen. FINDINGS: The heart size and mediastinal contours are within normal limits. Both lungs are clear. The visualized skeletal structures are unremarkable. IMPRESSION: Low lung volumes with cardiomegaly and new symmetric  interstitial opacity suspicious for pulmonary edema. Viral/atypical respiratory infection felt less likely. No pleural effusion is evident. Electronically Signed   By: Odessa Fleming M.D.   On: 04/21/2023 10:43   CT Head Wo Contrast  Result Date: 05/01/2023 CLINICAL DATA:  88 year old female with altered mental status, responsive only to pain. Found unresponsive this morning. EXAM: CT HEAD WITHOUT CONTRAST TECHNIQUE: Contiguous axial images were obtained from the base of the skull through the vertex without intravenous contrast. RADIATION DOSE REDUCTION: This exam was performed according to the departmental dose-optimization program which includes automated exposure control, adjustment of the mA and/or kV according to patient size and/or use of iterative reconstruction technique. COMPARISON:  Brain MRI 11/13/2022.  Head CT 04/03/2023. FINDINGS: Study is intermittently degraded by motion artifact despite repeated imaging attempts. Brain: Cerebral volume remains normal for age. No midline shift, ventriculomegaly, mass effect, evidence of mass lesion, intracranial hemorrhage or evidence of cortically based acute infarction. Gray-white differentiation appears stable and within normal limits for age. Vascular: No suspicious intracranial vascular hyperdensity. Calcified atherosclerosis at the skull base. Skull: Stable, No acute osseous abnormality identified. Sinuses/Orbits: Visualized paranasal sinuses and mastoids are stable and well aerated. Other: No acute orbit or scalp soft tissue finding. IMPRESSION: Motion degraded exam but noncontrast CT appearance of the brain appears stable and normal for age. Electronically Signed   By: Odessa Fleming M.D.   On: 04/26/2023 10:42    Procedures .Critical Care  Performed by: Linwood Dibbles, MD Authorized by: Linwood Dibbles, MD   Critical care provider statement:    Critical care time (minutes):  65   Critical care was time spent personally by me on the following activities:  Development  of treatment plan with patient or surrogate, discussions with consultants, evaluation of patient's response to treatment, examination of patient, ordering and review of laboratory studies, ordering and review of radiographic studies, ordering and  [facility] Provider Note   CSN: [id number] Arrival date & time: 05/01/2023  4098     History {Add pertinent medical, surgical, social history, OB history to HPI:1} Chief Complaint  Patient presents with  . Altered Mental Status    Chelsea Keller is a 88 y.o. female.   Altered Mental Status    Patient has a history of chronic venous sufficiency hypertension arthritis fibromyalgia B12 deficiency, CHF, degenerative joint disease.  Patient is a resident of a nursing facility.  Patient is a resident of [facility].  She was found unresponsive this morning.  Patient is DNR.  She has a MOST form that indicates no intubation although BiPAP is okay.  No CPR.  Patient unable to answer any questions.  On arrival to the ED her blood sugar is noted to be 38 Eckerd's were reviewed.  Patient was admitted to the hospital on September 28.  She was discharged on October 10.  Patient had been admitted to the hospital initially after a fall.  Patient was found to have a femur fracture.  Patient underwent open reduction internal fixation.  Patient ended up having some complications of colitis and she was started on IV antibiotics.  Patient was noted to have persistent leukocytosis.  Patient was started on Dificid.  She was discharged to a nursing facility. Family states prior to hospitalization.  She was alert and talkative.  Patient was living at home.  Home Medications Prior to Admission medications   Medication Sig Start Date End Date Taking? Authorizing Provider  acetaminophen (TYLENOL) 500 MG tablet Take 1,500 mg by mouth every 12 (twelve) hours as needed (for pain- with each dose of Tramadol).   Yes [provider]  allopurinol (ZYLOPRIM) 300 MG tablet TAKE 1 TABLET BY MOUTH EVERY DAY 01/11/23  Yes Tillman Abide I, MD  amLODipine (NORVASC) 5 MG tablet Take 1 tablet (5 mg total) by mouth daily. 04/16/23  Yes [place], Eric J, DO  colchicine 0.6 MG  tablet Take 1 tablet (0.6 mg total) by mouth 2 (two) times daily. 12/08/22  Yes [name], MD  ELIQUIS 2.5 MG TABS tablet TAKE 1 TABLET TWICE A DAY 01/21/23  Yes [name], MD  fidaxomicin (DIFICID) 200 MG TABS tablet Take 1 tablet (200 mg total) by mouth 2 (two) times daily for 3 days. 04/15/23 05/06/2023 Yes [place], Eric J, DO  gabapentin (NEURONTIN) 300 MG capsule TAKE 1 CAPSULE IN THE MORNING AND 2 CAPSULES AT BEDTIME Patient taking differently: Take 300-600 mg by mouth See admin instructions. Take 300 mg by mouth in the morning and 600 mg with supper 07/27/22  Yes [name], MD  hydrOXYzine (ATARAX) 10 MG tablet TAKE 1 TABLET BY MOUTH THREE TIMES A DAY AS NEEDED Patient taking differently: Take 10 mg by mouth daily as needed for itching. 02/24/23  Yes [name], MD  KLOR-CON M20 20 MEQ tablet TAKE 2 TABLETS (40 MEQ TOTAL) DAILY Patient taking differently: Take 40 mEq by mouth in the morning. 04/28/22  Yes [name], MD  methocarbamol (ROBAXIN) 500 MG tablet Take 1 tablet (500 mg total) by mouth every 6 (six) hours as needed for muscle spasms. 04/07/23  Yes McClung, Sarah A, PA-C  ondansetron (ZOFRAN) 4 MG tablet Take 1 tablet (4 mg total) by mouth every 8 (eight) hours as needed for nausea or vomiting. 03/15/23  Yes [name], MD  ondansetron (ZOFRAN-ODT) 4 MG disintegrating tablet Take 4  [facility] Provider Note   CSN: [id number] Arrival date & time: 05/01/2023  4098     History {Add pertinent medical, surgical, social history, OB history to HPI:1} Chief Complaint  Patient presents with  . Altered Mental Status    Chelsea Keller is a 88 y.o. female.   Altered Mental Status    Patient has a history of chronic venous sufficiency hypertension arthritis fibromyalgia B12 deficiency, CHF, degenerative joint disease.  Patient is a resident of a nursing facility.  Patient is a resident of [facility].  She was found unresponsive this morning.  Patient is DNR.  She has a MOST form that indicates no intubation although BiPAP is okay.  No CPR.  Patient unable to answer any questions.  On arrival to the ED her blood sugar is noted to be 38 Eckerd's were reviewed.  Patient was admitted to the hospital on September 28.  She was discharged on October 10.  Patient had been admitted to the hospital initially after a fall.  Patient was found to have a femur fracture.  Patient underwent open reduction internal fixation.  Patient ended up having some complications of colitis and she was started on IV antibiotics.  Patient was noted to have persistent leukocytosis.  Patient was started on Dificid.  She was discharged to a nursing facility. Family states prior to hospitalization.  She was alert and talkative.  Patient was living at home.  Home Medications Prior to Admission medications   Medication Sig Start Date End Date Taking? Authorizing Provider  acetaminophen (TYLENOL) 500 MG tablet Take 1,500 mg by mouth every 12 (twelve) hours as needed (for pain- with each dose of Tramadol).   Yes [provider]  allopurinol (ZYLOPRIM) 300 MG tablet TAKE 1 TABLET BY MOUTH EVERY DAY 01/11/23  Yes Tillman Abide I, MD  amLODipine (NORVASC) 5 MG tablet Take 1 tablet (5 mg total) by mouth daily. 04/16/23  Yes [place], Eric J, DO  colchicine 0.6 MG  tablet Take 1 tablet (0.6 mg total) by mouth 2 (two) times daily. 12/08/22  Yes [name], MD  ELIQUIS 2.5 MG TABS tablet TAKE 1 TABLET TWICE A DAY 01/21/23  Yes [name], MD  fidaxomicin (DIFICID) 200 MG TABS tablet Take 1 tablet (200 mg total) by mouth 2 (two) times daily for 3 days. 04/15/23 05/06/2023 Yes [place], Eric J, DO  gabapentin (NEURONTIN) 300 MG capsule TAKE 1 CAPSULE IN THE MORNING AND 2 CAPSULES AT BEDTIME Patient taking differently: Take 300-600 mg by mouth See admin instructions. Take 300 mg by mouth in the morning and 600 mg with supper 07/27/22  Yes [name], MD  hydrOXYzine (ATARAX) 10 MG tablet TAKE 1 TABLET BY MOUTH THREE TIMES A DAY AS NEEDED Patient taking differently: Take 10 mg by mouth daily as needed for itching. 02/24/23  Yes [name], MD  KLOR-CON M20 20 MEQ tablet TAKE 2 TABLETS (40 MEQ TOTAL) DAILY Patient taking differently: Take 40 mEq by mouth in the morning. 04/28/22  Yes [name], MD  methocarbamol (ROBAXIN) 500 MG tablet Take 1 tablet (500 mg total) by mouth every 6 (six) hours as needed for muscle spasms. 04/07/23  Yes McClung, Sarah A, PA-C  ondansetron (ZOFRAN) 4 MG tablet Take 1 tablet (4 mg total) by mouth every 8 (eight) hours as needed for nausea or vomiting. 03/15/23  Yes [name], MD  ondansetron (ZOFRAN-ODT) 4 MG disintegrating tablet Take 4

## 2023-04-18 NOTE — ED Notes (Signed)
Was only able to obtain 1 set of blood culture. Pt is a hard stick.

## 2023-04-18 NOTE — ED Notes (Signed)
Pt's Blood sugar keeps on trending down. MD made aware. D 50% ordered.

## 2023-04-18 NOTE — ED Triage Notes (Signed)
Pt BIB [facility] from [facility]. Only responsive to pain. Pt's baseline is Alert and oriented. Not sure when the last seen normal was. Staff check on the pt this morning found her unresponsive. GCS 8. HX CHF. Pt is DNR.   168/100 HR 100 95% RA  CBG 75

## 2023-04-18 NOTE — ED Notes (Signed)
Or  LORazepam (ATIVAN) 2 MG/ML concentrated solution 1 mg (has no administration in time range)    Or  LORazepam (ATIVAN) injection 1 mg (has no administration in time range)  haloperidol (HALDOL) tablet 0.5 mg (has no administration in time range)    Or  haloperidol (HALDOL) 2 MG/ML solution 0.5 mg (has no administration in time range)    Or  haloperidol lactate (HALDOL) injection 0.5 mg (has no administration in time range)  ondansetron (ZOFRAN-ODT) disintegrating tablet 4 mg (has no administration in time range)    Or  ondansetron (ZOFRAN) injection 4 mg (has no administration in time range)  glycopyrrolate (ROBINUL) tablet 1 mg (has no administration in time range)    Or  glycopyrrolate (ROBINUL) injection 0.2 mg (has no administration in time range)    Or  glycopyrrolate (ROBINUL) injection 0.2 mg (has no administration in time range)  chlorproMAZINE (THORAZINE) 12.5 mg in sodium chloride 0.9 % 25 mL IVPB (has no administration in time range)  polyvinyl  alcohol (LIQUIFILM TEARS) 1.4 % ophthalmic solution 1 drop (has no administration in time range)  dextrose 50 % solution 50 mL (50 mLs Intravenous Given 04/07/2023 0932)  sodium chloride 0.9 % bolus 1,000 mL (0 mLs Intravenous Stopped 04/29/2023 1315)  cefTRIAXone (ROCEPHIN) 1 g in sodium chloride 0.9 % 100 mL IVPB (0 g Intravenous Stopped 04/27/2023 1138)  dextrose 50 % solution 25 mL (25 mLs Intravenous Given 04/12/2023 1144)  dextrose 50 % solution 50 mL (50 mLs Intravenous Given 05/03/2023 1318)    Mobility non-ambulatory     Focused Assessments     R Recommendations: See Admitting Provider Note  Report given to:   Additional Notes:  Or  LORazepam (ATIVAN) 2 MG/ML concentrated solution 1 mg (has no administration in time range)    Or  LORazepam (ATIVAN) injection 1 mg (has no administration in time range)  haloperidol (HALDOL) tablet 0.5 mg (has no administration in time range)    Or  haloperidol (HALDOL) 2 MG/ML solution 0.5 mg (has no administration in time range)    Or  haloperidol lactate (HALDOL) injection 0.5 mg (has no administration in time range)  ondansetron (ZOFRAN-ODT) disintegrating tablet 4 mg (has no administration in time range)    Or  ondansetron (ZOFRAN) injection 4 mg (has no administration in time range)  glycopyrrolate (ROBINUL) tablet 1 mg (has no administration in time range)    Or  glycopyrrolate (ROBINUL) injection 0.2 mg (has no administration in time range)    Or  glycopyrrolate (ROBINUL) injection 0.2 mg (has no administration in time range)  chlorproMAZINE (THORAZINE) 12.5 mg in sodium chloride 0.9 % 25 mL IVPB (has no administration in time range)  polyvinyl  alcohol (LIQUIFILM TEARS) 1.4 % ophthalmic solution 1 drop (has no administration in time range)  dextrose 50 % solution 50 mL (50 mLs Intravenous Given 04/07/2023 0932)  sodium chloride 0.9 % bolus 1,000 mL (0 mLs Intravenous Stopped 04/29/2023 1315)  cefTRIAXone (ROCEPHIN) 1 g in sodium chloride 0.9 % 100 mL IVPB (0 g Intravenous Stopped 04/27/2023 1138)  dextrose 50 % solution 25 mL (25 mLs Intravenous Given 04/12/2023 1144)  dextrose 50 % solution 50 mL (50 mLs Intravenous Given 05/03/2023 1318)    Mobility non-ambulatory     Focused Assessments     R Recommendations: See Admitting Provider Note  Report given to:   Additional Notes:  Date: 04/26/2023 CLINICAL DATA:  88 year old female found unresponsive. Responsive only to pain. EXAM: CHEST  1 VIEW COMPARISON:  CT Chest, Abdomen, and Pelvis today are reported separately. 04/03/2023 and earlier. Portable AP semi upright view at 1017 hours. Low lung volumes. Cardiomegaly. Calcified aortic atherosclerosis. Visualized tracheal air column is within normal limits. Symmetric increased pulmonary interstitial opacity, new from last month. No pneumothorax, pleural effusion, consolidation. Chronic severe bony changes at the right shoulder. Stable cholecystectomy clips. Paucity of bowel gas in the upper abdomen. FINDINGS: The heart size and mediastinal contours are within normal limits. Both lungs are clear. The visualized skeletal structures are unremarkable. IMPRESSION: Low lung volumes  with cardiomegaly and new symmetric interstitial opacity suspicious for pulmonary edema. Viral/atypical respiratory infection felt less likely. No pleural effusion is evident. Electronically Signed   By: Odessa Fleming M.D.   On: 04/06/2023 10:43   CT Head Wo Contrast  Result Date: 04/12/2023 CLINICAL DATA:  88 year old female with altered mental status, responsive only to pain. Found unresponsive this morning. EXAM: CT HEAD WITHOUT CONTRAST TECHNIQUE: Contiguous axial images were obtained from the base of the skull through the vertex without intravenous contrast. RADIATION DOSE REDUCTION: This exam was performed according to the departmental dose-optimization program which includes automated exposure control, adjustment of the mA and/or kV according to patient size and/or use of iterative reconstruction technique. COMPARISON:  Brain MRI 11/13/2022.  Head CT 04/03/2023. FINDINGS: Study is intermittently degraded by motion artifact despite repeated imaging attempts. Brain: Cerebral volume remains normal for age. No midline shift, ventriculomegaly, mass effect, evidence of mass lesion, intracranial hemorrhage or evidence of cortically based acute infarction. Gray-white differentiation appears stable and within normal limits for age. Vascular: No suspicious intracranial vascular hyperdensity. Calcified atherosclerosis at the skull base. Skull: Stable, No acute osseous abnormality identified. Sinuses/Orbits: Visualized paranasal sinuses and mastoids are stable and well aerated. Other: No acute orbit or scalp soft tissue finding. IMPRESSION: Motion degraded exam but noncontrast CT appearance of the brain appears stable and normal for age. Electronically Signed   By: Odessa Fleming M.D.   On: 04/21/2023 10:42    Pending Labs Unresulted Labs (From admission, onward)     Start     Ordered   05/02/2023 1054  Blood culture (routine x 2)  BLOOD CULTURE X 2,   R (with STAT occurrences),   Status:  Canceled      05/04/2023 1053             Vitals/Pain Today's Vitals   04/22/2023 1148 04/27/2023 1202 05/05/2023 1345 04/12/2023 1400  BP:   97/61 (!) 104/52  Pulse: 100  93 94  Resp: 19  (!) 21 (!) 24  Temp:  (!) 96 F (35.6 C)    TempSrc:  Tympanic    SpO2: 100%  100% 100%    Isolation Precautions No active isolations  Medications Medications  dextrose 10 % infusion ( Intravenous New Bag/Given 04/22/2023 1322)  sodium chloride flush (NS) 0.9 % injection 3 mL (3 mLs Intravenous Given 05/01/2023 1406)  magic mouthwash (has no administration in time range)  acetaminophen (TYLENOL) tablet 650 mg (has no administration in time range)    Or  acetaminophen (TYLENOL) suppository 650 mg (has no administration in time range)  morphine bolus via infusion 1-4 mg (has no administration in time range)  morphine 100mg  in NS (1mg /mL) infusion - premix (has no administration in time range)  LORazepam (ATIVAN) tablet 1 mg (has no administration in time range)  Date: 04/26/2023 CLINICAL DATA:  88 year old female found unresponsive. Responsive only to pain. EXAM: CHEST  1 VIEW COMPARISON:  CT Chest, Abdomen, and Pelvis today are reported separately. 04/03/2023 and earlier. Portable AP semi upright view at 1017 hours. Low lung volumes. Cardiomegaly. Calcified aortic atherosclerosis. Visualized tracheal air column is within normal limits. Symmetric increased pulmonary interstitial opacity, new from last month. No pneumothorax, pleural effusion, consolidation. Chronic severe bony changes at the right shoulder. Stable cholecystectomy clips. Paucity of bowel gas in the upper abdomen. FINDINGS: The heart size and mediastinal contours are within normal limits. Both lungs are clear. The visualized skeletal structures are unremarkable. IMPRESSION: Low lung volumes  with cardiomegaly and new symmetric interstitial opacity suspicious for pulmonary edema. Viral/atypical respiratory infection felt less likely. No pleural effusion is evident. Electronically Signed   By: Odessa Fleming M.D.   On: 04/06/2023 10:43   CT Head Wo Contrast  Result Date: 04/12/2023 CLINICAL DATA:  88 year old female with altered mental status, responsive only to pain. Found unresponsive this morning. EXAM: CT HEAD WITHOUT CONTRAST TECHNIQUE: Contiguous axial images were obtained from the base of the skull through the vertex without intravenous contrast. RADIATION DOSE REDUCTION: This exam was performed according to the departmental dose-optimization program which includes automated exposure control, adjustment of the mA and/or kV according to patient size and/or use of iterative reconstruction technique. COMPARISON:  Brain MRI 11/13/2022.  Head CT 04/03/2023. FINDINGS: Study is intermittently degraded by motion artifact despite repeated imaging attempts. Brain: Cerebral volume remains normal for age. No midline shift, ventriculomegaly, mass effect, evidence of mass lesion, intracranial hemorrhage or evidence of cortically based acute infarction. Gray-white differentiation appears stable and within normal limits for age. Vascular: No suspicious intracranial vascular hyperdensity. Calcified atherosclerosis at the skull base. Skull: Stable, No acute osseous abnormality identified. Sinuses/Orbits: Visualized paranasal sinuses and mastoids are stable and well aerated. Other: No acute orbit or scalp soft tissue finding. IMPRESSION: Motion degraded exam but noncontrast CT appearance of the brain appears stable and normal for age. Electronically Signed   By: Odessa Fleming M.D.   On: 04/21/2023 10:42    Pending Labs Unresulted Labs (From admission, onward)     Start     Ordered   05/02/2023 1054  Blood culture (routine x 2)  BLOOD CULTURE X 2,   R (with STAT occurrences),   Status:  Canceled      05/04/2023 1053             Vitals/Pain Today's Vitals   04/22/2023 1148 04/27/2023 1202 05/05/2023 1345 04/12/2023 1400  BP:   97/61 (!) 104/52  Pulse: 100  93 94  Resp: 19  (!) 21 (!) 24  Temp:  (!) 96 F (35.6 C)    TempSrc:  Tympanic    SpO2: 100%  100% 100%    Isolation Precautions No active isolations  Medications Medications  dextrose 10 % infusion ( Intravenous New Bag/Given 04/22/2023 1322)  sodium chloride flush (NS) 0.9 % injection 3 mL (3 mLs Intravenous Given 05/01/2023 1406)  magic mouthwash (has no administration in time range)  acetaminophen (TYLENOL) tablet 650 mg (has no administration in time range)    Or  acetaminophen (TYLENOL) suppository 650 mg (has no administration in time range)  morphine bolus via infusion 1-4 mg (has no administration in time range)  morphine 100mg  in NS (1mg /mL) infusion - premix (has no administration in time range)  LORazepam (ATIVAN) tablet 1 mg (has no administration in time range)  Date: 04/26/2023 CLINICAL DATA:  88 year old female found unresponsive. Responsive only to pain. EXAM: CHEST  1 VIEW COMPARISON:  CT Chest, Abdomen, and Pelvis today are reported separately. 04/03/2023 and earlier. Portable AP semi upright view at 1017 hours. Low lung volumes. Cardiomegaly. Calcified aortic atherosclerosis. Visualized tracheal air column is within normal limits. Symmetric increased pulmonary interstitial opacity, new from last month. No pneumothorax, pleural effusion, consolidation. Chronic severe bony changes at the right shoulder. Stable cholecystectomy clips. Paucity of bowel gas in the upper abdomen. FINDINGS: The heart size and mediastinal contours are within normal limits. Both lungs are clear. The visualized skeletal structures are unremarkable. IMPRESSION: Low lung volumes  with cardiomegaly and new symmetric interstitial opacity suspicious for pulmonary edema. Viral/atypical respiratory infection felt less likely. No pleural effusion is evident. Electronically Signed   By: Odessa Fleming M.D.   On: 04/06/2023 10:43   CT Head Wo Contrast  Result Date: 04/12/2023 CLINICAL DATA:  88 year old female with altered mental status, responsive only to pain. Found unresponsive this morning. EXAM: CT HEAD WITHOUT CONTRAST TECHNIQUE: Contiguous axial images were obtained from the base of the skull through the vertex without intravenous contrast. RADIATION DOSE REDUCTION: This exam was performed according to the departmental dose-optimization program which includes automated exposure control, adjustment of the mA and/or kV according to patient size and/or use of iterative reconstruction technique. COMPARISON:  Brain MRI 11/13/2022.  Head CT 04/03/2023. FINDINGS: Study is intermittently degraded by motion artifact despite repeated imaging attempts. Brain: Cerebral volume remains normal for age. No midline shift, ventriculomegaly, mass effect, evidence of mass lesion, intracranial hemorrhage or evidence of cortically based acute infarction. Gray-white differentiation appears stable and within normal limits for age. Vascular: No suspicious intracranial vascular hyperdensity. Calcified atherosclerosis at the skull base. Skull: Stable, No acute osseous abnormality identified. Sinuses/Orbits: Visualized paranasal sinuses and mastoids are stable and well aerated. Other: No acute orbit or scalp soft tissue finding. IMPRESSION: Motion degraded exam but noncontrast CT appearance of the brain appears stable and normal for age. Electronically Signed   By: Odessa Fleming M.D.   On: 04/21/2023 10:42    Pending Labs Unresulted Labs (From admission, onward)     Start     Ordered   05/02/2023 1054  Blood culture (routine x 2)  BLOOD CULTURE X 2,   R (with STAT occurrences),   Status:  Canceled      05/04/2023 1053             Vitals/Pain Today's Vitals   04/22/2023 1148 04/27/2023 1202 05/05/2023 1345 04/12/2023 1400  BP:   97/61 (!) 104/52  Pulse: 100  93 94  Resp: 19  (!) 21 (!) 24  Temp:  (!) 96 F (35.6 C)    TempSrc:  Tympanic    SpO2: 100%  100% 100%    Isolation Precautions No active isolations  Medications Medications  dextrose 10 % infusion ( Intravenous New Bag/Given 04/22/2023 1322)  sodium chloride flush (NS) 0.9 % injection 3 mL (3 mLs Intravenous Given 05/01/2023 1406)  magic mouthwash (has no administration in time range)  acetaminophen (TYLENOL) tablet 650 mg (has no administration in time range)    Or  acetaminophen (TYLENOL) suppository 650 mg (has no administration in time range)  morphine bolus via infusion 1-4 mg (has no administration in time range)  morphine 100mg  in NS (1mg /mL) infusion - premix (has no administration in time range)  LORazepam (ATIVAN) tablet 1 mg (has no administration in time range)  Or  LORazepam (ATIVAN) 2 MG/ML concentrated solution 1 mg (has no administration in time range)    Or  LORazepam (ATIVAN) injection 1 mg (has no administration in time range)  haloperidol (HALDOL) tablet 0.5 mg (has no administration in time range)    Or  haloperidol (HALDOL) 2 MG/ML solution 0.5 mg (has no administration in time range)    Or  haloperidol lactate (HALDOL) injection 0.5 mg (has no administration in time range)  ondansetron (ZOFRAN-ODT) disintegrating tablet 4 mg (has no administration in time range)    Or  ondansetron (ZOFRAN) injection 4 mg (has no administration in time range)  glycopyrrolate (ROBINUL) tablet 1 mg (has no administration in time range)    Or  glycopyrrolate (ROBINUL) injection 0.2 mg (has no administration in time range)    Or  glycopyrrolate (ROBINUL) injection 0.2 mg (has no administration in time range)  chlorproMAZINE (THORAZINE) 12.5 mg in sodium chloride 0.9 % 25 mL IVPB (has no administration in time range)  polyvinyl  alcohol (LIQUIFILM TEARS) 1.4 % ophthalmic solution 1 drop (has no administration in time range)  dextrose 50 % solution 50 mL (50 mLs Intravenous Given 04/07/2023 0932)  sodium chloride 0.9 % bolus 1,000 mL (0 mLs Intravenous Stopped 04/29/2023 1315)  cefTRIAXone (ROCEPHIN) 1 g in sodium chloride 0.9 % 100 mL IVPB (0 g Intravenous Stopped 04/27/2023 1138)  dextrose 50 % solution 25 mL (25 mLs Intravenous Given 04/12/2023 1144)  dextrose 50 % solution 50 mL (50 mLs Intravenous Given 05/03/2023 1318)    Mobility non-ambulatory     Focused Assessments     R Recommendations: See Admitting Provider Note  Report given to:   Additional Notes:

## 2023-04-18 NOTE — Plan of Care (Signed)
Patient responsive to pain on admission and patient showed signs of labored breathing. Patient on 4L nasal cannula and tolerating IV fluids. Morphine drip initiated with Juliette Alcide, RN and skin assessed with Lloyd Huger, RN. Breakdown of skin noticed on sacrum, changed sacral foam dressing. Patient DNR copy in chart. Family at bedside, palliative care measures continued.    Problem: Education: Goal: Knowledge of the prescribed therapeutic regimen will improve Outcome: Progressing   Problem: Coping: Goal: Ability to identify and develop effective coping behavior will improve Outcome: Progressing   Problem: Clinical Measurements: Goal: Quality of life will improve Outcome: Progressing   Problem: Respiratory: Goal: Verbalizations of increased ease of respirations will increase Outcome: Progressing   Problem: Role Relationship: Goal: Family's ability to cope with current situation will improve Outcome: Progressing   Problem: Role Relationship: Goal: Ability to verbalize concerns, feelings, and thoughts to partner or family member will improve Outcome: Progressing   Problem: Pain Management: Goal: Satisfaction with pain management regimen will improve Outcome: Progressing   Problem: Education: Goal: Knowledge of General Education information will improve Description: Including pain rating scale, medication(s)/side effects and non-pharmacologic comfort measures Outcome: Progressing   Problem: Health Behavior/Discharge Planning: Goal: Ability to manage health-related needs will improve Outcome: Progressing   Problem: Clinical Measurements: Goal: Ability to maintain clinical measurements within normal limits will improve Outcome: Progressing   Problem: Clinical Measurements: Goal: Will remain free from infection Outcome: Progressing   Problem: Clinical Measurements: Goal: Diagnostic test results will improve Outcome: Progressing   Problem: Clinical Measurements: Goal: Respiratory  complications will improve Outcome: Progressing   Problem: Clinical Measurements: Goal: Cardiovascular complication will be avoided Outcome: Progressing   Problem: Activity: Goal: Risk for activity intolerance will decrease Outcome: Progressing   Problem: Nutrition: Goal: Adequate nutrition will be maintained Outcome: Progressing   Problem: Coping: Goal: Level of anxiety will decrease Outcome: Progressing   Problem: Elimination: Goal: Will not experience complications related to bowel motility Outcome: Progressing   Problem: Elimination: Goal: Will not experience complications related to urinary retention Outcome: Progressing   Problem: Pain Managment: Goal: General experience of comfort will improve Outcome: Progressing   Problem: Safety: Goal: Ability to remain free from injury will improve Outcome: Progressing   Problem: Skin Integrity: Goal: Risk for impaired skin integrity will decrease Outcome: Progressing

## 2023-04-19 DIAGNOSIS — Z515 Encounter for palliative care: Secondary | ICD-10-CM | POA: Diagnosis not present

## 2023-04-19 DIAGNOSIS — G9340 Encephalopathy, unspecified: Secondary | ICD-10-CM | POA: Diagnosis not present

## 2023-04-19 DIAGNOSIS — Z7189 Other specified counseling: Secondary | ICD-10-CM | POA: Diagnosis not present

## 2023-04-19 NOTE — Progress Notes (Addendum)
Palliative Medicine Inpatient Follow Up Note HPI: Chelsea Keller is a 88 y.o. female with medical history significant of hypertension, CKD 3B, diastolic CHF, venous insufficiency, anemia, recurrent DVT/PE, obesity, mood disorder, gout presenting after being found altered.    The palliative medicine team has been asked to get involved to assist with comfort mediated care.  Today's Discussion 04/19/2023  *Please note that this is a verbal dictation therefore any spelling or grammatical errors are due to the "[employer]agon [employer] One" system interpretation.  Chart reviewed inclusive of vital signs, progress notes, laboratory results, and diagnostic images.   I met with Chelsea Keller at bedside this morning. Her extremities are cool and have started to mottle. She is cheyne-stokes breathing at this point. She is not coherent or arousable.   I called patients daughter, Chelsea Keller. Created space and opportunity for Chelsea Keller to explore thoughts feelings and fears regarding her mothers current medical situation. She shares that she is aware time will be short. I did reaffirm this. I shared we are looking at hours at the most though anything could occur at anytime. Patients daughter is going to see if her sisters would like to face-time as one is unlikely to make it from [place] to see her.  Ongoing comfort care with morphine gtt.  Provided  "Gone From My Site" booklet.  Questions and concerns addressed/Palliative Support Provided.  __________________________ AddendumMicah Keller to bedside with Chelsea Keller.   FaceTime with daughter, Chelsea Keller as she is in [place] at this time and concerned she may not make it in time prior to mothers passing.  Story telling about Chelsea Keller.  Honor of Chelsea Keller life.  Time: 25 _____________________________ Addendum #2Micah Keller to bedside this afternoon per request of the patients daughters. We were able to facetime patient grandson to allow him time to say his goodbyes.     Objective  Assessment: Vital Signs Vitals:   04/06/2023 2028 04/19/23 0609  BP: (!) 89/60 (!) 76/39  Pulse: 81 70  Resp: 18 20  Temp:    SpO2: 100% 98%    Intake/Output Summary (Last 24 hours) at 04/19/2023 0946 Last data filed at 04/19/2023 7829 Gross per 24 hour  Intake 1847.44 ml  Output --  Net 1847.44 ml   Gen: Elderly Caucasian female chronically ill-appearing HEENT: Dry mucous membranes CV: Regular rate and rhythm PULM: On 3LPM, abdominal breathing ABD: soft/nontender EXT: Bilateral UE edema,  pedal edema, cool extremties Neuro: Oriented to self and place but not clear on situation  SUMMARY OF RECOMMENDATIONS   DNAR/DNI   Comfort Care  Continue morphine gtt --> No titration needed at this time  Anticipate in hospital death   Ongoing PMT support  Billing based on MDM: High ______________________________________________________________________________________ Lamarr Lulas Chelsea Keller Palliative Medicine Team Team Cell Phone: [phone] Please utilize secure chat with additional questions, if there is no response within 30 minutes please call the above phone number  Palliative Medicine Team providers are available by phone from 7am to 7pm daily and can be reached through the team cell phone.  Should this patient require assistance outside of these hours, please call the patient's attending physician.

## 2023-04-19 NOTE — Progress Notes (Signed)
  Bedside nurse reported patient has bilateral lower extremity pitting edema 3+. - Change IV fluid rate to 100 cc/h to 50 cc/h.  Also per chart review patient's family wanted to continue IV fluids and they are not advocating/willing to continue the antibiotic anymore.  I have informed the bedside nurse to stop the antibiotic and only continue the maintenance fluid.  Continue all comfort care measurement.   Tereasa Coop, MD [facility] 04/19/2023, 11:55 PM

## 2023-04-19 NOTE — Consult Note (Signed)
[facility]   [facility] Inpatient Consult   04/19/2023  Chelsea Keller 10-01-1934 [id number]  [facility] [[facility]]  Accountable Care Organization [ACO] Patient: Medicare ACO REACH  Patient reviewed for readmission less than 30 days with extreme high risk score for unplanned readmission risk.  Chart reviewed and reveals the patient is currently transitioning to comfort measures.  [name] at bedside, awaiting ongoing visit from chaplain services and palliative team.  Plan:  No [facility] Care Management is planned for transitional needs. Will sign off at transition from hospital.  For questions,   Charlesetta Shanks, RN BSN CCM [facility]  [phone] business mobile phone Toll free office [phone]  *Concierge Line  [phone] Fax number: [phone] [name]

## 2023-04-19 NOTE — Progress Notes (Signed)
PROGRESS NOTE    Chelsea Keller  ZOX:[id number] DOB: 09/11/1934 DOA: 05/07/2023 PCP: [name], MD   Brief Narrative:  HPI: Asiah Pina is a 88 y.o. female with medical history significant of hypertension, CKD 3B, diastolic CHF, venous insufficiency, anemia, recurrent DVT/PE, obesity, mood disorder, gout presenting after being found altered.   Patient currently residing at [facility] following admission from 9/28 until 10/10 for fall, hip and toe fracture, colitis eventually identified as C. difficile, AKI.  Was doing better on discharge with downtrending leukocytosis though it remained in the 40s and was on Dificid through 10/14.   This morning found to be unresponsive by staff at facility and brought to the ED for further evaluation.   Patient unable to participate in review of systems.   Family confirmed patient is DNR/DNI and would not want pressor support for her blood pressure either with EDP.    When I spoke with family, we further discussed that patient's altered mentation was not entirely new and that she had been somewhat altered ever since her surgery during previous admission.  He just seemed to worse for staff this morning and her blood pressure has been getting worse as well.   I confirmed with family including 2 daughters in the room and 1 by phone who is a nurse that patient is DNR/DNI and would not want pressor support.  We further discussed options including trial of antibiotics overnight versus continuing with making patient full comfort care can sit during her continue decline and now developing liver failure. We discussed that the IV antibiotics she did receive today are dosed every 24 hours and so she would not be due for another dose until middle of the day tomorrow even if we continued with them.   Family opted to make patient full comfort care and to discontinue antibiotics but continue with IV fluids.  Family members are planning to try to come to see  patient tomorrow.  Will continue with full comfort care.   ED Course: Vital signs in the ED notable for blood pressure in the 90s to 110 systolic, heart rate in the 90s to 100s.  Lab workup included CMP with bicarb 16, gap 18, BUN 110, creatinine elevated to 1.75 from baseline of 1.4, glucose 39, calcium 8.5, protein 3.8, albumin 1.9, AST 81, ALT 59, alk phos 657 up from previously 164, T. bili 6.1 up from previous of 2.4.  Leukocytosis of 37.2 which is improved from discharge however unclear trajectory, hemoglobin stable at 10.  Lactic acid elevated to 10.1 and then remained elevated at 10 on repeat.  Ammonia level 93.  Urinalysis with glucose and bilirubin only.  Blood cultures pending.  Chest x-ray with low lung volume fumes also noted was cardiomegaly and symmetric opacities consistent with edema versus atypical infection.  CT head showed no acute normality but there was motion degradation.  CT of the abdomen and pelvis showed similar appearing significant colitis and unchanged nodular contour of the liver suggesting cirrhosis.  Patient received ceftriaxone, dextrose, D10 infusion, 2 3.5 L bolus followed by additional IV fluid infusion in the ED.  Assessment & Plan:   Principal Problem:   Acute encephalopathy Active Problems:   Essential hypertension   Venous (peripheral) insufficiency   Morbid obesity (HCC)   Chronic diastolic CHF (congestive heart failure) (HCC)   Chronic kidney disease, stage 3a (HCC)   Symptomatic anemia   Gout   Recurrent pulmonary emboli (HCC)   Recurrent deep vein thrombosis (DVT) (HCC)  Acute encephalopathy/ Hyperglycemia / Elevated ammonia / Hypotension Acute liver failure /septic shock/Klebsiella pneumonia bacteremia/lactic acidosis/AKI on CKD stage IIIb/goal of care discussion: Patient admitted for hospital service, initially aggressive care including antibiotics, fluids were restarted, patient was seen by palliative care, family decided to proceed with comfort  care.  Patient was started on morphine drip as well as antibiotics.  Patient having agonal breathing this morning and close to death.  Death in the hospital is anticipated.  Palliative following.   DVT prophylaxis:    Code Status: Do not attempt resuscitation (DNR) - Comfort care  Family Communication:  None present at bedside.    Status is: Inpatient Remains inpatient appropriate because: Patient too unstable to be transferred, in-hospital death anticipated.   Estimated body mass index is 42.18 kg/m as calculated from the following:   Height as of 04/03/23: 5\' 3"  (1.6 m).   Weight as of 04/03/23: 108 kg.  Pressure Injury 04/09/23 Coccyx Mid Unstageable - Full thickness tissue loss in which the base of the injury is covered by slough (yellow, tan, gray, green or brown) and/or eschar (tan, brown or black) in the wound bed. (Active)  04/09/23   Location: Coccyx  Location Orientation: Mid  Staging: Unstageable - Full thickness tissue loss in which the base of the injury is covered by slough (yellow, tan, gray, green or brown) and/or eschar (tan, brown or black) in the wound bed.  Wound Description (Comments):   Present on Admission: No  Dressing Type Foam - Lift dressing to assess site every shift 04/19/23 0800   Nutritional Assessment: There is no height or weight on file to calculate BMI.. Seen by dietician.  I agree with the assessment and plan as outlined below: Nutrition Status:        . Skin Assessment: I have examined the patient's skin and I agree with the wound assessment as performed by the wound care RN as outlined below: Pressure Injury 04/09/23 Coccyx Mid Unstageable - Full thickness tissue loss in which the base of the injury is covered by slough (yellow, tan, gray, green or brown) and/or eschar (tan, brown or black) in the wound bed. (Active)  04/09/23   Location: Coccyx  Location Orientation: Mid  Staging: Unstageable - Full thickness tissue loss in which the base  of the injury is covered by slough (yellow, tan, gray, green or brown) and/or eschar (tan, brown or black) in the wound bed.  Wound Description (Comments):   Present on Admission: No  Dressing Type Foam - Lift dressing to assess site every shift 04/19/23 0800    Consultants:  Palliative care    Antimicrobials:  Anti-infectives (From admission, onward)    Start     Dose/Rate Route Frequency Ordered Stop   04/19/23 0000  cefTRIAXone (ROCEPHIN) 2 g in sodium chloride 0.9 % 100 mL IVPB        2 g 200 mL/hr over 30 Minutes Intravenous Every 24 hours 04/06/2023 2328 04/25/23 2359   05/02/2023 1400  metroNIDAZOLE (FLAGYL) IVPB 500 mg  Status:  Discontinued        500 mg 100 mL/hr over 60 Minutes Intravenous  Once 05/01/2023 1346 04/27/2023 1504   04/13/2023 1115  cefTRIAXone (ROCEPHIN) 1 g in sodium chloride 0.9 % 100 mL IVPB        1 g 200 mL/hr over 30 Minutes Intravenous  Once 04/06/2023 1100 05/04/2023 1138         Subjective: Patient seen and examined earlier, patient was lethargic with agonal  breathing but appeared comfortable.  Objective: Vitals:   04/22/2023 1611 April 22, 2023 1749 Apr 22, 2023 2028 04/19/23 0609  BP: (!) 89/47 (!) 75/39 (!) 89/60 (!) 76/39  Pulse: 93 83 81 70  Resp: 14 14 18 20   Temp:      TempSrc:      SpO2: 100%  100% 98%    Intake/Output Summary (Last 24 hours) at 04/19/2023 1619 Last data filed at 04/19/2023 7829 Gross per 24 hour  Intake 1847.44 ml  Output --  Net 1847.44 ml   There were no vitals filed for this visit.  Examination:  General exam: Appears lethargic with agonal breathing but comfortable. Respiratory system: Clear to auscultation. Respiratory effort normal.   Data Reviewed: I have personally reviewed following labs and imaging studies  CBC: Recent Labs  Lab 04/13/23 0801 04/14/23 0605 04/15/23 0753 2023-04-22 0942  WBC 45.9* 44.8* 44.1* 37.2*  NEUTROABS  --   --   --  29.4*  HGB 10.1* 10.3* 10.3* 10.0*  HCT 31.4* 33.4* 32.7* 33.4*   MCV 85.3 89.5 90.6 96.5  PLT 164 222 220 268   Basic Metabolic Panel: Recent Labs  Lab 04/14/23 0605 04/15/23 0753 04-22-23 0942  NA 133* 135 137  K 4.5 3.8 4.2  CL 98 99 103  CO2 22 21* 16*  GLUCOSE 110* 106* 39*  BUN 89* 89* 110*  CREATININE 1.04* 1.05* 1.75*  CALCIUM 8.3* 8.5* 8.5*   GFR: CrCl cannot be calculated (Unknown ideal weight.). Liver Function Tests: Recent Labs  Lab 04/22/23 0942  AST 81*  ALT 59*  ALKPHOS 657*  BILITOT 6.1*  PROT 3.8*  ALBUMIN 1.9*   No results for input(s): "LIPASE", "AMYLASE" in the last 168 hours. Recent Labs  Lab Apr 22, 2023 0942  AMMONIA 93*   Coagulation Profile: No results for input(s): "INR", "PROTIME" in the last 168 hours. Cardiac Enzymes: No results for input(s): "CKTOTAL", "CKMB", "CKMBINDEX", "TROPONINI" in the last 168 hours. BNP (last 3 results) No results for input(s): "PROBNP" in the last 8760 hours. HbA1C: No results for input(s): "HGBA1C" in the last 72 hours. CBG: Recent Labs  Lab 04-22-23 0931 04/22/23 0948 Apr 22, 2023 1036 04-22-2023 1120 Apr 22, 2023 1311  GLUCAP 38* 122* 84 76 63*   Lipid Profile: No results for input(s): "CHOL", "HDL", "LDLCALC", "TRIG", "CHOLHDL", "LDLDIRECT" in the last 72 hours. Thyroid Function Tests: No results for input(s): "TSH", "T4TOTAL", "FREET4", "T3FREE", "THYROIDAB" in the last 72 hours. Anemia Panel: No results for input(s): "VITAMINB12", "FOLATE", "FERRITIN", "TIBC", "IRON", "RETICCTPCT" in the last 72 hours. Sepsis Labs: Recent Labs  Lab 04-22-2023 1059 Apr 22, 2023 1322  LATICACIDVEN 10.1* 10.0*    Recent Results (from the past 240 hour(s))  Blood culture (routine x 2)     Status: Abnormal (Preliminary result)   Collection Time: 2023-04-22 10:54 AM   Specimen: BLOOD  Result Value Ref Range Status   Specimen Description BLOOD RIGHT ANTECUBITAL  Final   Special Requests   Final    BOTTLES DRAWN AEROBIC AND ANAEROBIC Blood Culture results may not be optimal due to an  inadequate volume of blood received in culture bottles   Culture  Setup Time   Final    GRAM NEGATIVE RODS IN BOTH AEROBIC AND ANAEROBIC BOTTLES CRITICAL RESULT CALLED TO, READ BACK BY AND VERIFIED WITH: J [facility]  22-Apr-2023 MK    Culture (A)  Final    KLEBSIELLA PNEUMONIAE SUSCEPTIBILITIES TO FOLLOW Performed at [facility], [address] [place], [place] [address]    Report Status PENDING  Incomplete  Blood Culture ID Panel (Reflexed)     Status: Abnormal   Collection Time: 09-May-2023 10:54 AM  Result Value Ref Range Status   Enterococcus faecalis NOT DETECTED NOT DETECTED Final   Enterococcus Faecium NOT DETECTED NOT DETECTED Final   Listeria monocytogenes NOT DETECTED NOT DETECTED Final   Staphylococcus species NOT DETECTED NOT DETECTED Final   Staphylococcus aureus (BCID) NOT DETECTED NOT DETECTED Final   Staphylococcus epidermidis NOT DETECTED NOT DETECTED Final   Staphylococcus lugdunensis NOT DETECTED NOT DETECTED Final   Streptococcus species NOT DETECTED NOT DETECTED Final   Streptococcus agalactiae NOT DETECTED NOT DETECTED Final   Streptococcus pneumoniae NOT DETECTED NOT DETECTED Final   Streptococcus pyogenes NOT DETECTED NOT DETECTED Final   A.calcoaceticus-baumannii NOT DETECTED NOT DETECTED Final   Bacteroides fragilis NOT DETECTED NOT DETECTED Final   Enterobacterales DETECTED (A) NOT DETECTED Final    Comment: Enterobacterales represent a large order of gram negative bacteria, not a single organism. CRITICAL RESULT CALLED TO, READ BACK BY AND VERIFIED WITH: J [email or web address]  May 09, 2023 MK    Enterobacter cloacae complex NOT DETECTED NOT DETECTED Final   Escherichia coli NOT DETECTED NOT DETECTED Final   Klebsiella aerogenes NOT DETECTED NOT DETECTED Final   Klebsiella oxytoca NOT DETECTED NOT DETECTED Final   Klebsiella pneumoniae DETECTED (A) NOT DETECTED Final    Comment: CRITICAL RESULT CALLED TO, READ BACK BY AND VERIFIED WITH: J  [email or web address]  May 09, 2023 MK    Proteus species NOT DETECTED NOT DETECTED Final   Salmonella species NOT DETECTED NOT DETECTED Final   Serratia marcescens NOT DETECTED NOT DETECTED Final   Haemophilus influenzae NOT DETECTED NOT DETECTED Final   Neisseria meningitidis NOT DETECTED NOT DETECTED Final   Pseudomonas aeruginosa NOT DETECTED NOT DETECTED Final   Stenotrophomonas maltophilia NOT DETECTED NOT DETECTED Final   Candida albicans NOT DETECTED NOT DETECTED Final   Candida auris NOT DETECTED NOT DETECTED Final   Candida glabrata NOT DETECTED NOT DETECTED Final   Candida krusei NOT DETECTED NOT DETECTED Final   Candida parapsilosis NOT DETECTED NOT DETECTED Final   Candida tropicalis NOT DETECTED NOT DETECTED Final   Cryptococcus neoformans/gattii NOT DETECTED NOT DETECTED Final   CTX-M ESBL NOT DETECTED NOT DETECTED Final   Carbapenem resistance IMP NOT DETECTED NOT DETECTED Final   Carbapenem resistance KPC NOT DETECTED NOT DETECTED Final   Carbapenem resistance NDM NOT DETECTED NOT DETECTED Final   Carbapenem resist OXA 48 LIKE NOT DETECTED NOT DETECTED Final   Carbapenem resistance VIM NOT DETECTED NOT DETECTED Final    Comment: Performed at [facility], [address] [place], [place] [address]     Radiology Studies: CT ABDOMEN PELVIS WO CONTRAST  Result Date: 05/09/23 CLINICAL DATA:  Altered mental status. Elevated LFTs and acute kidney injury. EXAM: CT ABDOMEN AND PELVIS WITHOUT CONTRAST TECHNIQUE: Multidetector CT imaging of the abdomen and pelvis was performed following the standard protocol without IV contrast. RADIATION DOSE REDUCTION: This exam was performed according to the departmental dose-optimization program which includes automated exposure control, adjustment of the mA and/or kV according to patient size and/or use of iterative reconstruction technique. COMPARISON:  CT abdomen pelvis dated April 07, 2023. FINDINGS: Lower chest: Trace right pleural  effusion. Hepatobiliary: No focal liver abnormality is seen. Slightly nodular left liver contour is unchanged. Status post cholecystectomy. No biliary dilatation. Pancreas: Unremarkable. No pancreatic ductal dilatation or surrounding inflammatory changes. Spleen: Normal in size without focal abnormality. Adrenals/Urinary Tract: Unchanged 1.1 cm left adrenal  nodule previously characterized as an adenoma, benign. No follow-up imaging is recommended. Normal right adrenal gland. Unchanged bilateral renal vascular calcifications. No calculi or hydronephrosis. Bladder is unremarkable for the degree of distention. Stomach/Bowel: The stomach and small bowel are unremarkable. Diffuse colonic diverticulosis again noted. Similar diffuse colonic wall thickening with mild pericolonic inflammatory stranding involving the ascending and descending colon. Normal appendix. Vascular/Lymphatic: Aortic atherosclerosis. No enlarged abdominal or pelvic lymph nodes. Reproductive: Status post hysterectomy. No adnexal masses. Other: No free fluid or pneumoperitoneum. Unchanged body wall edema. Musculoskeletal: No acute or significant osseous findings. Chronic L4 compression deformity again noted. IMPRESSION: 1. Similar diffuse colonic wall thickening with mild pericolonic inflammatory stranding involving the ascending and descending colon, consistent with colitis. 2. Unchanged slightly nodular left liver contour. Correlate for cirrhosis. 3. Trace right pleural effusion. 4.  Aortic Atherosclerosis (ICD10-I70.0). Electronically Signed   By: Obie Dredge M.D.   On: 04/15/2023 13:12   DG Chest 1 View  Result Date: 04/09/2023 CLINICAL DATA:  88 year old female found unresponsive. Responsive only to pain. EXAM: CHEST  1 VIEW COMPARISON:  CT Chest, Abdomen, and Pelvis today are reported separately. 04/03/2023 and earlier. Portable AP semi upright view at 1017 hours. Low lung volumes. Cardiomegaly. Calcified aortic atherosclerosis.  Visualized tracheal air column is within normal limits. Symmetric increased pulmonary interstitial opacity, new from last month. No pneumothorax, pleural effusion, consolidation. Chronic severe bony changes at the right shoulder. Stable cholecystectomy clips. Paucity of bowel gas in the upper abdomen. FINDINGS: The heart size and mediastinal contours are within normal limits. Both lungs are clear. The visualized skeletal structures are unremarkable. IMPRESSION: Low lung volumes with cardiomegaly and new symmetric interstitial opacity suspicious for pulmonary edema. Viral/atypical respiratory infection felt less likely. No pleural effusion is evident. Electronically Signed   By: Odessa Fleming M.D.   On: 04/21/2023 10:43   CT Head Wo Contrast  Result Date: 04/24/2023 CLINICAL DATA:  88 year old female with altered mental status, responsive only to pain. Found unresponsive this morning. EXAM: CT HEAD WITHOUT CONTRAST TECHNIQUE: Contiguous axial images were obtained from the base of the skull through the vertex without intravenous contrast. RADIATION DOSE REDUCTION: This exam was performed according to the departmental dose-optimization program which includes automated exposure control, adjustment of the mA and/or kV according to patient size and/or use of iterative reconstruction technique. COMPARISON:  Brain MRI 11/13/2022.  Head CT 04/03/2023. FINDINGS: Study is intermittently degraded by motion artifact despite repeated imaging attempts. Brain: Cerebral volume remains normal for age. No midline shift, ventriculomegaly, mass effect, evidence of mass lesion, intracranial hemorrhage or evidence of cortically based acute infarction. Gray-white differentiation appears stable and within normal limits for age. Vascular: No suspicious intracranial vascular hyperdensity. Calcified atherosclerosis at the skull base. Skull: Stable, No acute osseous abnormality identified. Sinuses/Orbits: Visualized paranasal sinuses and  mastoids are stable and well aerated. Other: No acute orbit or scalp soft tissue finding. IMPRESSION: Motion degraded exam but noncontrast CT appearance of the brain appears stable and normal for age. Electronically Signed   By: Odessa Fleming M.D.   On: 04/07/2023 10:42    Scheduled Meds:  sodium chloride flush  3 mL Intravenous Q12H   Continuous Infusions:  cefTRIAXone (ROCEPHIN)  IV Stopped (04/19/23 0130)   chlorproMAZINE (THORAZINE) 12.5 mg in sodium chloride 0.9 % 25 mL IVPB     dextrose 100 mL/hr at 04/19/23 0658   morphine 5 mg/hr (04/19/23 0658)     LOS: 1 day   Hughie Closs, MD [facility]  04/19/2023, 4:19 PM   *Please note that this is a verbal dictation therefore any spelling or grammatical errors are due to the "Dragon Medical One" system interpretation.  Please page via Amion and do not message via secure chat for urgent patient care matters. Secure chat can be used for non urgent patient care matters.  How to contact the [facility] Attending or Consulting provider 7A - 7P or covering provider during after hours 7P -7A, for this patient?  Check the care team in [facility] and look for a) attending/consulting [facility] provider listed and b) the [facility] team listed. Page or secure chat 7A-7P. Log into [email or web address] and use [facility]'s universal password to access. If you do not have the password, please contact the hospital operator. Locate the [facility] provider you are looking for under [facility] and page to a number that you can be directly reached. If you still have difficulty reaching the provider, please page the [facility] (Director on Call) for the Hospitalists listed on amion for assistance.

## 2023-04-19 NOTE — Progress Notes (Addendum)
This chaplain accompanied PMT NP-Michelle for EOL spiritual care with the Pt. NP-Michelle began a FaceTime call with the Pt. daughter-Chelsea Keller. Chelsea Keller's goal is to be present at the bedside with the Pt.  The chaplain listened reflectively as Chelsea Keller shared many memories of time together with her three sisters and the Pt. The chaplain affirmed the love in the family story through Chelsea Keller's facial expressions.  Both NP-Michellle and Chelsea Keller offered F/U care as needed.  **1345 Family is at the bedside. The chaplain understands from the Pt. daughters the Pt. is Catholic. The family is requesting Anointing of the Sick. This chaplain left a VM for Father Ree Kida requesting a Pt. visit. This chaplain will update the RN and family.  **1506 Father Ree Kida will visit at 7:30p tonight.  Chaplain Stephanie Acre [phone]

## 2023-04-20 ENCOUNTER — Encounter: Payer: Self-pay | Admitting: Internal Medicine

## 2023-04-20 DIAGNOSIS — G9340 Encephalopathy, unspecified: Secondary | ICD-10-CM | POA: Diagnosis not present

## 2023-04-20 DIAGNOSIS — Z7189 Other specified counseling: Secondary | ICD-10-CM | POA: Diagnosis not present

## 2023-04-20 DIAGNOSIS — Z515 Encounter for palliative care: Secondary | ICD-10-CM | POA: Diagnosis not present

## 2023-04-20 LAB — CULTURE, BLOOD (ROUTINE X 2)

## 2023-04-20 MED ORDER — DEXTROSE IN LACTATED RINGERS 5 % IV SOLN: INTRAVENOUS | Status: DC

## 2023-04-21 ENCOUNTER — Other Ambulatory Visit: Payer: Self-pay | Admitting: Internal Medicine

## 2023-05-07 NOTE — Death Summary Note (Signed)
DEATH SUMMARY   Patient Details  Name: Chelsea Keller MRN: [id number] DOB: Apr 22, 1935 ONG:EXBMWU, Chelsea Rose, MD Admission/Discharge Information   Admit Date:  05/12/23  Date of Death: Date of Death: 05-14-2023  Time of Death: Time of Death: 1816  Length of Stay: 2   Principle Cause of death: septic shock/Klebsiella pneumonia bacteremia/lactic acidosis/AKI on CKD stage IIIb  Hospital Diagnoses: Principal Problem:   Acute encephalopathy Active Problems:   Essential hypertension   Venous (peripheral) insufficiency   Morbid obesity (HCC)   Chronic diastolic CHF (congestive heart failure) (HCC)   Chronic kidney disease, stage 3a (HCC)   Symptomatic anemia   Gout   Recurrent pulmonary emboli (HCC)   Recurrent deep vein thrombosis (DVT) [facility])   Hospital Course: No notes on file  Chelsea Keller is a 88 y.o. female with medical history significant of hypertension, CKD 3B, diastolic CHF, venous insufficiency, anemia, recurrent DVT/PE, obesity, mood disorder, gout presented with acute encephalopathy due to elevated ammonia, acute liver failure due to septic shock and Klebsiella pneumonia bacteremia.  GOC took place via palliative, transitioned to comfort care.   Acute encephalopathy/ Hyperglycemia / Elevated ammonia / Hypotension Acute liver failure /septic shock/Klebsiella pneumonia bacteremia/lactic acidosis/AKI on CKD stage IIIb/goal of care discussion: Patient admitted for hospital service, initially aggressive care including antibiotics, fluids were restarted, patient was seen by palliative care, family decided to proceed with comfort care.  Patient was started on morphine drip as well as antibiotics but then antibiotics were discontinued eventually. Patient eventually expired peacefully on 05-14-2023 at 1816 with family at bedside.  Assessment and Plan: No notes have been filed under this hospital service. Service: Hospitalist        Procedures: none  Consultations:  palliative care  The results of significant diagnostics from this hospitalization (including imaging, microbiology, ancillary and laboratory) are listed below for reference.   Significant Diagnostic Studies: CT ABDOMEN PELVIS WO CONTRAST  Result Date: 05-12-2023 CLINICAL DATA:  Altered mental status. Elevated LFTs and acute kidney injury. EXAM: CT ABDOMEN AND PELVIS WITHOUT CONTRAST TECHNIQUE: Multidetector CT imaging of the abdomen and pelvis was performed following the standard protocol without IV contrast. RADIATION DOSE REDUCTION: This exam was performed according to the departmental dose-optimization program which includes automated exposure control, adjustment of the mA and/or kV according to patient size and/or use of iterative reconstruction technique. COMPARISON:  CT abdomen pelvis dated April 07, 2023. FINDINGS: Lower chest: Trace right pleural effusion. Hepatobiliary: No focal liver abnormality is seen. Slightly nodular left liver contour is unchanged. Status post cholecystectomy. No biliary dilatation. Pancreas: Unremarkable. No pancreatic ductal dilatation or surrounding inflammatory changes. Spleen: Normal in size without focal abnormality. Adrenals/Urinary Tract: Unchanged 1.1 cm left adrenal nodule previously characterized as an adenoma, benign. No follow-up imaging is recommended. Normal right adrenal gland. Unchanged bilateral renal vascular calcifications. No calculi or hydronephrosis. Bladder is unremarkable for the degree of distention. Stomach/Bowel: The stomach and small bowel are unremarkable. Diffuse colonic diverticulosis again noted. Similar diffuse colonic wall thickening with mild pericolonic inflammatory stranding involving the ascending and descending colon. Normal appendix. Vascular/Lymphatic: Aortic atherosclerosis. No enlarged abdominal or pelvic lymph nodes. Reproductive: Status post hysterectomy. No adnexal masses. Other: No free fluid or pneumoperitoneum. Unchanged body  wall edema. Musculoskeletal: No acute or significant osseous findings. Chronic L4 compression deformity again noted. IMPRESSION: 1. Similar diffuse colonic wall thickening with mild pericolonic inflammatory stranding involving the ascending and descending colon, consistent with colitis. 2. Unchanged slightly nodular left liver contour. Correlate for cirrhosis.  3. Trace right pleural effusion. 4.  Aortic Atherosclerosis (ICD10-I70.0). Electronically Signed   By: Obie Dredge M.D.   On: 04/17/2023 13:12   DG Chest 1 View  Result Date: 04/23/2023 CLINICAL DATA:  88 year old female found unresponsive. Responsive only to pain. EXAM: CHEST  1 VIEW COMPARISON:  CT Chest, Abdomen, and Pelvis today are reported separately. 04/03/2023 and earlier. Portable AP semi upright view at 1017 hours. Low lung volumes. Cardiomegaly. Calcified aortic atherosclerosis. Visualized tracheal air column is within normal limits. Symmetric increased pulmonary interstitial opacity, new from last month. No pneumothorax, pleural effusion, consolidation. Chronic severe bony changes at the right shoulder. Stable cholecystectomy clips. Paucity of bowel gas in the upper abdomen. FINDINGS: The heart size and mediastinal contours are within normal limits. Both lungs are clear. The visualized skeletal structures are unremarkable. IMPRESSION: Low lung volumes with cardiomegaly and new symmetric interstitial opacity suspicious for pulmonary edema. Viral/atypical respiratory infection felt less likely. No pleural effusion is evident. Electronically Signed   By: Odessa Fleming M.D.   On: 05/05/2023 10:43   CT Head Wo Contrast  Result Date: 04/10/2023 CLINICAL DATA:  88 year old female with altered mental status, responsive only to pain. Found unresponsive this morning. EXAM: CT HEAD WITHOUT CONTRAST TECHNIQUE: Contiguous axial images were obtained from the base of the skull through the vertex without intravenous contrast. RADIATION DOSE REDUCTION: This  exam was performed according to the departmental dose-optimization program which includes automated exposure control, adjustment of the mA and/or kV according to patient size and/or use of iterative reconstruction technique. COMPARISON:  Brain MRI 11/13/2022.  Head CT 04/03/2023. FINDINGS: Study is intermittently degraded by motion artifact despite repeated imaging attempts. Brain: Cerebral volume remains normal for age. No midline shift, ventriculomegaly, mass effect, evidence of mass lesion, intracranial hemorrhage or evidence of cortically based acute infarction. Gray-white differentiation appears stable and within normal limits for age. Vascular: No suspicious intracranial vascular hyperdensity. Calcified atherosclerosis at the skull base. Skull: Stable, No acute osseous abnormality identified. Sinuses/Orbits: Visualized paranasal sinuses and mastoids are stable and well aerated. Other: No acute orbit or scalp soft tissue finding. IMPRESSION: Motion degraded exam but noncontrast CT appearance of the brain appears stable and normal for age. Electronically Signed   By: Odessa Fleming M.D.   On: 04/29/2023 10:42   DG Toe Great Right  Result Date: 04/09/2023 CLINICAL DATA:  Right great toe pain. EXAM: RIGHT GREAT TOE COMPARISON:  Right foot radiographs-03/26/2023 FINDINGS: Osteopenia. There is a potential minimally displaced obliquely oriented fracture involving the base of the distal phalanx of the great toe with associated geographic osteopenia involving the lateral aspect of the great toe. This finding is associated with adjacent soft tissue swelling. No subcutaneous emphysema. No radiopaque foreign body. Mild degenerative change of the first MTP joint with joint space loss, subchondral sclerosis and osteophytosis. No significant hallux valgus deformity. Adjacent joint spaces appear preserved given obliquity and field of view. IMPRESSION: Potential minimally displaced obliquely oriented fracture involving the base  of the distal phalanx of the great toe with associated geographic osteopenia involving the lateral aspect of the great toe. While this is presumably the sequela of subacute injury, underlying infection is not excluded on the basis of this examination. Clinical correlation is advised. Further evaluation with MRI could be performed as indicated Electronically Signed   By: Simonne Come M.D.   On: 04/09/2023 10:13   VAS [place] LOWER EXTREMITY VENOUS (DVT)  Result Date: 04/07/2023  Lower Venous DVT Study Patient Name:  MAKAILYN  Konicki  Date of Exam:   04/07/2023 Medical Rec #: [id number]      Accession #:    [id number] Date of Birth: 10/17/1934      Patient Gender: F Patient Age:   84 years Exam Location:  [facility] Procedure:      VAS [place] LOWER EXTREMITY VENOUS (DVT) Referring Phys: PARDEEP KHATRI --------------------------------------------------------------------------------  Indications: Pain.  Risk Factors: None identified. Limitations: Poor ultrasound/tissue interface and body habitus. Comparison Study: No prior studies. Performing Technologist: Chanda Busing RVT  Examination Guidelines: A complete evaluation includes B-mode imaging, spectral Doppler, color Doppler, and power Doppler as needed of all accessible portions of each vessel. Bilateral testing is considered an integral part of a complete examination. Limited examinations for reoccurring indications may be performed as noted. The reflux portion of the exam is performed with the patient in reverse Trendelenburg.  +---------+---------------+---------+-----------+----------+-------------------+ RIGHT    CompressibilityPhasicitySpontaneityPropertiesThrombus Aging      +---------+---------------+---------+-----------+----------+-------------------+ CFV      Full           Yes      Yes                                      +---------+---------------+---------+-----------+----------+-------------------+ SFJ      Full                                                              +---------+---------------+---------+-----------+----------+-------------------+ FV Prox  Full                                                             +---------+---------------+---------+-----------+----------+-------------------+ FV Mid   Full                                                             +---------+---------------+---------+-----------+----------+-------------------+ FV DistalFull                                                             +---------+---------------+---------+-----------+----------+-------------------+ PFV      Full                                                             +---------+---------------+---------+-----------+----------+-------------------+ POP      Full           Yes      Yes                                      +---------+---------------+---------+-----------+----------+-------------------+  PTV      Full                                                             +---------+---------------+---------+-----------+----------+-------------------+ PERO                                                  Not well visualized +---------+---------------+---------+-----------+----------+-------------------+   +----+---------------+---------+-----------+----------+--------------+ LEFTCompressibilityPhasicitySpontaneityPropertiesThrombus Aging +----+---------------+---------+-----------+----------+--------------+ CFV Full           Yes      Yes                                 +----+---------------+---------+-----------+----------+--------------+    Summary: RIGHT: - There is no evidence of deep vein thrombosis in the lower extremity. However, portions of this examination were limited- see technologist comments above.  - No cystic structure found in the popliteal fossa.  LEFT: - No evidence of common femoral vein obstruction.   *See table(s) above for measurements and  observations. Electronically signed by Sherald Hess MD on 04/07/2023 at 3:51:40 PM.    Final    CT ABDOMEN PELVIS WO CONTRAST  Result Date: 04/07/2023 CLINICAL DATA:  Diverticulitis, complication suspected. Persistent leukocytosis. EXAM: CT ABDOMEN AND PELVIS WITHOUT CONTRAST TECHNIQUE: Multidetector CT imaging of the abdomen and pelvis was performed following the standard protocol without IV contrast. RADIATION DOSE REDUCTION: This exam was performed according to the departmental dose-optimization program which includes automated exposure control, adjustment of the mA and/or kV according to patient size and/or use of iterative reconstruction technique. COMPARISON:  CT chest, abdomen, and pelvis 04/03/2023 FINDINGS: Lower chest: Mild atelectasis in the lung bases. Coronary atherosclerosis. Normal heart size. Hepatobiliary: Status post cholecystectomy without evidence of significant biliary dilatation. No focal liver abnormality is identified within limitations of noncontrast technique, motion, and streak artifact. Pancreas: Unremarkable. Spleen: Unremarkable. Adrenals/Urinary Tract: Unremarkable right adrenal gland. Small chronic left adrenal nodule, not appreciably enlarged from a 2014 chest CT compatible with a benign etiology and with no follow-up imaging recommended. Vascular calcifications in the renal hila. Unchanged 1.8 cm low-density lesion in the lower pole of the left kidney, characterized as a cyst on the recent prior contrast enhanced CT and for which no follow-up imaging is recommended. No hydronephrosis. Unremarkable bladder. Stomach/Bowel: The stomach is unremarkable. There is no evidence of bowel obstruction. Left-sided colonic diverticulosis is again noted. There is mild pericolonic stranding which is more extensive than on the prior CT and now involves much of the left as well as right colon with associated mild wall thickening. The appendix is unremarkable. Vascular/Lymphatic: Abdominal  aortic atherosclerosis without aneurysm. No enlarged lymph nodes. Reproductive: Status post hysterectomy. No adnexal masses. Other: No ascites or pneumoperitoneum.  Body wall edema. Musculoskeletal: Unchanged L4 compression fracture. No suspicious osseous lesion. IMPRESSION: 1. Progressive mild, relatively diffuse colonic inflammation suggesting colitis. 2.  Aortic Atherosclerosis (ICD10-I70.0). Electronically Signed   By: Sebastian Ache M.D.   On: 04/07/2023 13:11   DG Knee Left Port  Result Date: 04/05/2023 CLINICAL DATA:  Fracture, postop. EXAM: PORTABLE LEFT KNEE - 1-2 VIEW COMPARISON:  Preoperative imaging.  FINDINGS: Lateral plate and multi screw fixation fixating distal femur fracture. Improved fracture alignment from preoperative imaging. Prior total knee arthroplasty. Recent postsurgical change includes air and edema in the soft tissues. IMPRESSION: ORIF of distal femur fracture without immediate postoperative complication. Electronically Signed   By: Narda Rutherford M.D.   On: 04/05/2023 20:15   DG Ankle Complete Left  Result Date: 04/05/2023 CLINICAL DATA:  Fracture. EXAM: LEFT ANKLE COMPLETE - 3+ VIEW COMPARISON:  None Available. FINDINGS: The bones are under mineralized. Distal fibular fracture appears remote with surrounding callus formation. No evidence of acute fracture. There is tibial talar joint space narrowing with subchondral cystic change and spurring. No ankle joint effusion. Irregular plantar calcaneal spur with small Achilles tendon enthesophyte. Diffuse soft tissue calcifications about the lower leg. IMPRESSION: 1. Remote distal fibular fracture with surrounding callus formation. No evidence of acute fracture. 2. Tibiotalar osteoarthritis. 3. Irregular plantar calcaneal spur. Electronically Signed   By: Narda Rutherford M.D.   On: 04/05/2023 19:03   DG FEMUR MIN 2 VIEWS LEFT  Result Date: 04/05/2023 CLINICAL DATA:  ORIF distal femur fracture. EXAM: LEFT FEMUR 2 VIEWS COMPARISON:   Preoperative imaging. FINDINGS: Four fluoroscopic spot views of the left femur obtained in the operating room. Plate and screw fixation of distal femur fracture. Previous knee arthroplasty. Fluoroscopy time 54.6 seconds. Dose 5.85 mGy. IMPRESSION: Intraoperative fluoroscopy during ORIF of distal femur fracture. Electronically Signed   By: Narda Rutherford M.D.   On: 04/05/2023 19:01   DG C-Arm 1-60 Min-No Report  Result Date: 04/05/2023 Fluoroscopy was utilized by the requesting physician.  No radiographic interpretation.   DG Foot 2 Views Right  Result Date: 04/03/2023 CLINICAL DATA:  Fall, right leg pain, foot pain EXAM: RIGHT FOOT - 2 VIEW COMPARISON:  None Available. FINDINGS: No acute bony abnormality. Specifically, no fracture, subluxation, or dislocation. Diffuse osteopenia. Vascular and soft tissue calcifications. IMPRESSION: No acute bony abnormality. Electronically Signed   By: Charlett Nose M.D.   On: 04/03/2023 20:56   CT CHEST ABDOMEN PELVIS W CONTRAST  Result Date: 04/03/2023 CLINICAL DATA:  Patient fell. Poly trauma, blunt. Chronic anticoagulation. EXAM: CT CHEST, ABDOMEN, AND PELVIS WITH CONTRAST TECHNIQUE: Multidetector CT imaging of the chest, abdomen and pelvis was performed following the standard protocol during bolus administration of intravenous contrast. RADIATION DOSE REDUCTION: This exam was performed according to the departmental dose-optimization program which includes automated exposure control, adjustment of the mA and/or kV according to patient size and/or use of iterative reconstruction technique. CONTRAST:  OMNIPAQUE IOHEXOL 300 MG/ML  SOLN COMPARISON:  Abdomen and pelvis CT 03/17/2023 FINDINGS: CT CHEST FINDINGS Cardiovascular: The heart size is normal. No substantial pericardial effusion. Coronary artery calcification is evident. Moderate atherosclerotic calcification is noted in the wall of the thoracic aorta. Enlargement of the pulmonary outflow tract/main  pulmonary arteries suggests pulmonary arterial hypertension. Mediastinum/Nodes: No mediastinal lymphadenopathy. There is no hilar lymphadenopathy. The esophagus has normal imaging features. There is no axillary lymphadenopathy. Lungs/Pleura: No suspicious pulmonary nodule or mass. No focal airspace consolidation. No pleural effusion. Musculoskeletal: No worrisome lytic or sclerotic osseous abnormality. Chronic posttraumatic/degenerative deformity noted right shoulder. No sternal fracture. No evidence for thoracic spine fracture. CT ABDOMEN PELVIS FINDINGS Hepatobiliary: No suspicious focal abnormality within the liver parenchyma. Gallbladder is surgically absent. No intrahepatic or extrahepatic biliary dilation. Pancreas: No focal mass lesion. No dilatation of the main duct. No intraparenchymal cyst. No peripancreatic edema. Spleen: No splenomegaly. No suspicious focal mass lesion. Adrenals/Urinary Tract: Small left  adrenal nodule stable since 04/11/2018 consistent with benign etiology such as adenoma. No followup imaging is recommended. Vascular calcification seen in the hilum of each kidney. No suspicious renal mass lesion. Small cyst noted lower pole left kidney. No followup imaging is recommended. No evidence for hydroureter. The urinary bladder appears normal for the degree of distention. Stomach/Bowel: Stomach is unremarkable. No gastric wall thickening. No evidence of outlet obstruction. Duodenum is normally positioned as is the ligament of Treitz. Portions of the small bowel and right colon have been excluded from the field of view. Within this limitation there is no small bowel dilatation to suggest obstruction. No colonic dilatation. Diverticular changes are noted in the left colon without evidence of diverticulitis. There is some subtle pericolonic edema/inflammation along the distal descending colon (axial image 71/series 4) suggesting diverticulitis Vascular/Lymphatic: There is moderate atherosclerotic  calcification of the abdominal aorta without aneurysm. There is no gastrohepatic or hepatoduodenal ligament lymphadenopathy. No retroperitoneal or mesenteric lymphadenopathy. No pelvic sidewall lymphadenopathy. Reproductive: Hysterectomy.  There is no adnexal mass. Other: No intraperitoneal free fluid. Musculoskeletal: No worrisome lytic or sclerotic osseous abnormality. L4 superior endplate compression deformity is stable since 03/17/2023. IMPRESSION: 1. No evidence for acute traumatic injury in the chest, abdomen, or pelvis. 2. Subtle pericolonic edema/inflammation along the distal descending colon suggesting diverticulitis. To suggest perforation or abscess. 3. Enlargement of the pulmonary outflow tract/main pulmonary arteries suggests pulmonary arterial hypertension. 4.  Aortic Atherosclerosis (ICD10-I70.0). No findings Electronically Signed   By: Kennith Center M.D.   On: 04/03/2023 17:30   CT HEAD WO CONTRAST ( )  Result Date: 04/03/2023 CLINICAL DATA:  Head trauma.  Fall.  Patient on blood thinner. EXAM: CT HEAD WITHOUT CONTRAST TECHNIQUE: Contiguous axial images were obtained from the base of the skull through the vertex without intravenous contrast. RADIATION DOSE REDUCTION: This exam was performed according to the departmental dose-optimization program which includes automated exposure control, adjustment of the mA and/or kV according to patient size and/or use of iterative reconstruction technique. COMPARISON:  Head CT dated 07/17/2016. FINDINGS: Brain: Mild age-related atrophy and chronic microvascular ischemic changes. There is no acute intracranial hemorrhage. No mass effect or midline shift. No extra-axial fluid collection. Vascular: No hyperdense vessel or unexpected calcification. Skull: Normal. Negative for fracture or focal lesion. Sinuses/Orbits: No acute finding. Other: None IMPRESSION: 1. No acute intracranial pathology. 2. Mild age-related atrophy and chronic microvascular ischemic  changes. Electronically Signed   By: Elgie Collard M.D.   On: 04/03/2023 17:27   CT CERVICAL SPINE WO CONTRAST  Result Date: 04/03/2023 CLINICAL DATA:  Fall.  Polytrauma, blunt EXAM: CT CERVICAL SPINE WITHOUT CONTRAST TECHNIQUE: Multidetector CT imaging of the cervical spine was performed without intravenous contrast. Multiplanar CT image reconstructions were also generated. RADIATION DOSE REDUCTION: This exam was performed according to the departmental dose-optimization program which includes automated exposure control, adjustment of the mA and/or kV according to patient size and/or use of iterative reconstruction technique. COMPARISON:  None Available. FINDINGS: Alignment: 3 mm degenerative anterolisthesis of C3 on C4. Skull base and vertebrae: Diffuse osteopenia. No fracture or focal bone lesion. Soft tissues and spinal canal: No prevertebral fluid or swelling. No visible canal hematoma. Disc levels: Advanced diffuse degenerative facet disease bilaterally. Mild degenerative disc disease with disc space narrowing and anterior spurring. No visible disc herniation. Upper chest: No acute findings Other: None IMPRESSION: Moderate to advanced multilevel degenerative spondylosis. No acute bony abnormality. Electronically Signed   By: Charlett Nose M.D.   On: 04/03/2023 17:22  DG Knee 2 Views Left  Result Date: 04/03/2023 CLINICAL DATA:  Fall.  Pain. EXAM: LEFT KNEE - 1-2 VIEW COMPARISON:  None Available. FINDINGS: Status post tricompartmental knee replacement. Acute comminuted fracture of the distal femoral metaphysis identified at the level of the femoral component. There is approximately 1/2 shaft with the posterior displacement of the femoral component of the knee replacement relative to the metaphyseal shaft. IMPRESSION: Acute comminuted fracture of the distal femoral metaphysis at the level of the femoral component of the knee replacement. Electronically Signed   By: Kennith Center M.D.   On: 04/03/2023  15:17   DG FEMUR, MIN 2 VIEWS RIGHT  Result Date: 04/03/2023 CLINICAL DATA:  Fall with pain. EXAM: RIGHT FEMUR 2 VIEWS COMPARISON:  None Available. FINDINGS: Study limited by osteopenia and body habitus. No displaced fracture of the femoral neck evident. Degenerative changes are seen in all 3 compartments of the knee with small joint effusion. IMPRESSION: 1. Limited study due to osteopenia and body habitus. No displaced fracture evident. 2. Degenerative changes in the right knee with small joint effusion. Electronically Signed   By: Kennith Center M.D.   On: 04/03/2023 15:16   DG Hip Unilat W or Wo Pelvis 2-3 Views Left  Result Date: 04/03/2023 CLINICAL DATA:  Fall.  Left leg pain. EXAM: DG HIP (WITH OR WITHOUT PELVIS) 2-3V LEFT COMPARISON:  04/03/2017 FINDINGS: Bones are diffusely demineralized. No evidence for pubic ramus or sacral fracture. AP and cross-table lateral views of the left hip show no evidence for a displaced left femoral neck fracture although assessment is limited by osteopenia and body habitus. IMPRESSION: No evidence for displaced left femoral neck fracture although assessment is limited by osteopenia and body habitus. If there is high clinical concern for occult fracture, CT recommended to further evaluate. Electronically Signed   By: Kennith Center M.D.   On: 04/03/2023 15:15   DG Chest Portable 1 View  Result Date: 04/03/2023 CLINICAL DATA:  Fall.  Pain everywhere. EXAM: PORTABLE CHEST 1 VIEW COMPARISON:  11/12/2022 FINDINGS: The cardio pericardial silhouette is enlarged. Mild vascular congestion without edema. The lungs are clear without focal pneumonia, edema, pneumothorax or pleural effusion. No acute bony abnormality. IMPRESSION: Enlargement of the cardiopericardial silhouette with mild vascular congestion. Electronically Signed   By: Kennith Center M.D.   On: 04/03/2023 15:14    Microbiology: Recent Results (from the past 240 hour(s))  Blood culture (routine x 2)     Status:  Abnormal   Collection Time: 04/21/2023 10:54 AM   Specimen: BLOOD  Result Value Ref Range Status   Specimen Description BLOOD RIGHT ANTECUBITAL  Final   Special Requests   Final    BOTTLES DRAWN AEROBIC AND ANAEROBIC Blood Culture results may not be optimal due to an inadequate volume of blood received in culture bottles   Culture  Setup Time   Final    GRAM NEGATIVE RODS IN BOTH AEROBIC AND ANAEROBIC BOTTLES CRITICAL RESULT CALLED TO, READ BACK BY AND VERIFIED WITH: J WYLAND,PHARM@[email or web address]  04/13/2023 MK Performed at [facility], [address] [place], [place] [address]    Culture KLEBSIELLA PNEUMONIAE (A)  Final   Report Status 2023-05-05 FINAL  Final   Organism ID, Bacteria KLEBSIELLA PNEUMONIAE  Final      Susceptibility   Klebsiella pneumoniae - MIC*    AMPICILLIN >=32 RESISTANT Resistant     CEFEPIME <=0.12 SENSITIVE Sensitive     CEFTAZIDIME <=1 SENSITIVE Sensitive     CEFTRIAXONE <=0.25  SENSITIVE Sensitive     CIPROFLOXACIN <=0.25 SENSITIVE Sensitive     GENTAMICIN <=1 SENSITIVE Sensitive     IMIPENEM <=0.25 SENSITIVE Sensitive     TRIMETH/SULFA <=20 SENSITIVE Sensitive     AMPICILLIN/SULBACTAM 8 SENSITIVE Sensitive     PIP/TAZO <=4 SENSITIVE Sensitive ug/mL    * KLEBSIELLA PNEUMONIAE  Blood Culture ID Panel (Reflexed)     Status: Abnormal   Collection Time: 05/01/2023 10:54 AM  Result Value Ref Range Status   Enterococcus faecalis NOT DETECTED NOT DETECTED Final   Enterococcus Faecium NOT DETECTED NOT DETECTED Final   Listeria monocytogenes NOT DETECTED NOT DETECTED Final   Staphylococcus species NOT DETECTED NOT DETECTED Final   Staphylococcus aureus (BCID) NOT DETECTED NOT DETECTED Final   Staphylococcus epidermidis NOT DETECTED NOT DETECTED Final   Staphylococcus lugdunensis NOT DETECTED NOT DETECTED Final   Streptococcus species NOT DETECTED NOT DETECTED Final   Streptococcus agalactiae NOT DETECTED NOT DETECTED Final   Streptococcus pneumoniae NOT DETECTED NOT  DETECTED Final   Streptococcus pyogenes NOT DETECTED NOT DETECTED Final   A.calcoaceticus-baumannii NOT DETECTED NOT DETECTED Final   Bacteroides fragilis NOT DETECTED NOT DETECTED Final   Enterobacterales DETECTED (A) NOT DETECTED Final    Comment: Enterobacterales represent a large order of gram negative bacteria, not a single organism. CRITICAL RESULT CALLED TO, READ BACK BY AND VERIFIED WITH: J WYLAND,PHARMD@[email or web address]  04/14/2023 MK    Enterobacter cloacae complex NOT DETECTED NOT DETECTED Final   Escherichia coli NOT DETECTED NOT DETECTED Final   Klebsiella aerogenes NOT DETECTED NOT DETECTED Final   Klebsiella oxytoca NOT DETECTED NOT DETECTED Final   Klebsiella pneumoniae DETECTED (A) NOT DETECTED Final    Comment: CRITICAL RESULT CALLED TO, READ BACK BY AND VERIFIED WITH: J [facility]  04/17/2023 MK    Proteus species NOT DETECTED NOT DETECTED Final   Salmonella species NOT DETECTED NOT DETECTED Final   Serratia marcescens NOT DETECTED NOT DETECTED Final   Haemophilus influenzae NOT DETECTED NOT DETECTED Final   Neisseria meningitidis NOT DETECTED NOT DETECTED Final   Pseudomonas aeruginosa NOT DETECTED NOT DETECTED Final   Stenotrophomonas maltophilia NOT DETECTED NOT DETECTED Final   Candida albicans NOT DETECTED NOT DETECTED Final   Candida auris NOT DETECTED NOT DETECTED Final   Candida glabrata NOT DETECTED NOT DETECTED Final   Candida krusei NOT DETECTED NOT DETECTED Final   Candida parapsilosis NOT DETECTED NOT DETECTED Final   Candida tropicalis NOT DETECTED NOT DETECTED Final   Cryptococcus neoformans/gattii NOT DETECTED NOT DETECTED Final   CTX-M ESBL NOT DETECTED NOT DETECTED Final   Carbapenem resistance IMP NOT DETECTED NOT DETECTED Final   Carbapenem resistance KPC NOT DETECTED NOT DETECTED Final   Carbapenem resistance NDM NOT DETECTED NOT DETECTED Final   Carbapenem resist OXA 48 LIKE NOT DETECTED NOT DETECTED Final   Carbapenem resistance VIM NOT DETECTED NOT  DETECTED Final    Comment: Performed at [facility], [address] [place], [place] [address]    Time spent: 15 minutes  Signed: Hughie Closs, MD 04/21/23

## 2023-05-07 NOTE — Progress Notes (Signed)
PROGRESS NOTE    Chelsea Keller  ION:[id number] DOB: 09-16-1934 DOA: 04/26/2023 PCP: [name], MD   Brief Narrative:  Chelsea Keller is a 88 y.o. female with medical history significant of hypertension, CKD 3B, diastolic CHF, venous insufficiency, anemia, recurrent DVT/PE, obesity, mood disorder, gout presented with acute encephalopathy due to elevated ammonia, acute liver failure due to septic shock and Klebsiella pneumonia bacteremia.  GOC took place via palliative, transition to comfort care.  Assessment & Plan:   Principal Problem:   Acute encephalopathy Active Problems:   Essential hypertension   Venous (peripheral) insufficiency   Morbid obesity (HCC)   Chronic diastolic CHF (congestive heart failure) (HCC)   Chronic kidney disease, stage 3a (HCC)   Symptomatic anemia   Gout   Recurrent pulmonary emboli (HCC)   Recurrent deep vein thrombosis (DVT) (HCC)  Acute encephalopathy/ Hyperglycemia / Elevated ammonia / Hypotension Acute liver failure /septic shock/Klebsiella pneumonia bacteremia/lactic acidosis/AKI on CKD stage IIIb/goal of care discussion: Patient admitted for hospital service, initially aggressive care including antibiotics, fluids were restarted, patient was seen by palliative care, family decided to proceed with comfort care.  Patient was started on morphine drip as well as antibiotics but then antibiotics were discontinued.  Patient having agonal breathing this morning and close to death.  Death in the hospital is anticipated.  Palliative following.   DVT prophylaxis:    Code Status: Do not attempt resuscitation (DNR) - Comfort care  Family Communication:  None present at bedside.    Status is: Inpatient Remains inpatient appropriate because: Patient too unstable to be transferred, in-hospital death anticipated.   Estimated body mass index is 42.18 kg/m as calculated from the following:   Height as of 04/03/23: 5\' 3"  (1.6 m).   Weight as of 04/03/23:  108 kg.  Pressure Injury 04/09/23 Coccyx Mid Unstageable - Full thickness tissue loss in which the base of the injury is covered by slough (yellow, tan, gray, green or brown) and/or eschar (tan, brown or black) in the wound bed. (Active)  04/09/23   Location: Coccyx  Location Orientation: Mid  Staging: Unstageable - Full thickness tissue loss in which the base of the injury is covered by slough (yellow, tan, gray, green or brown) and/or eschar (tan, brown or black) in the wound bed.  Wound Description (Comments):   Present on Admission: No  Dressing Type Foam - Lift dressing to assess site every shift 05-15-2023 0800   Nutritional Assessment: There is no height or weight on file to calculate BMI.. Seen by dietician.  I agree with the assessment and plan as outlined below: Nutrition Status:        . Skin Assessment: I have examined the patient's skin and I agree with the wound assessment as performed by the wound care RN as outlined below: Pressure Injury 04/09/23 Coccyx Mid Unstageable - Full thickness tissue loss in which the base of the injury is covered by slough (yellow, tan, gray, green or brown) and/or eschar (tan, brown or black) in the wound bed. (Active)  04/09/23   Location: Coccyx  Location Orientation: Mid  Staging: Unstageable - Full thickness tissue loss in which the base of the injury is covered by slough (yellow, tan, gray, green or brown) and/or eschar (tan, brown or black) in the wound bed.  Wound Description (Comments):   Present on Admission: No  Dressing Type Foam - Lift dressing to assess site every shift 05-15-23 0800    Consultants:  Palliative care    Antimicrobials:  Anti-infectives (From admission, onward)    Start     Dose/Rate Route Frequency Ordered Stop   04/19/23 0000  cefTRIAXone (ROCEPHIN) 2 g in sodium chloride 0.9 % 100 mL IVPB  Status:  Discontinued        2 g 200 mL/hr over 30 Minutes Intravenous Every 24 hours April 19, 2023 2328 04/09/2023 0023    2023-04-19 1400  metroNIDAZOLE (FLAGYL) IVPB 500 mg  Status:  Discontinued        500 mg 100 mL/hr over 60 Minutes Intravenous  Once April 19, 2023 1346 04/19/2023 1504   Apr 19, 2023 1115  cefTRIAXone (ROCEPHIN) 1 g in sodium chloride 0.9 % 100 mL IVPB        1 g 200 mL/hr over 30 Minutes Intravenous  Once 2023-04-19 1100 Apr 19, 2023 1138         Subjective: Patient seen and examined.  Multiple family members at the bedside.  Patient appears comfortable.  Objective: Vitals:   04-19-23 1749 04-19-23 2028 04/19/23 0609 04/19/23 2017  BP: (!) 75/39 (!) 89/60 (!) 76/39 (!) 72/38  Pulse: 83 81 70 79  Resp: 14 18 20 12   Temp:    (!) 97.5 F (36.4 C)  TempSrc:    Axillary  SpO2:  100% 98% 99%    Intake/Output Summary (Last 24 hours) at 05/02/2023 1305 Last data filed at 04/28/2023 0651 Gross per 24 hour  Intake --  Output 0 ml  Net 0 ml   There were no vitals filed for this visit.  Examination:  General exam: Appears lethargic with agonal breathing but comfortable. Respiratory system: Clear to auscultation. Respiratory effort normal.   Data Reviewed: I have personally reviewed following labs and imaging studies  CBC: Recent Labs  Lab 04/14/23 0605 04/15/23 0753 04/19/23 0942  WBC 44.8* 44.1* 37.2*  NEUTROABS  --   --  29.4*  HGB 10.3* 10.3* 10.0*  HCT 33.4* 32.7* 33.4*  MCV 89.5 90.6 96.5  PLT 222 220 268   Basic Metabolic Panel: Recent Labs  Lab 04/14/23 0605 04/15/23 0753 April 19, 2023 0942  NA 133* 135 137  K 4.5 3.8 4.2  CL 98 99 103  CO2 22 21* 16*  GLUCOSE 110* 106* 39*  BUN 89* 89* 110*  CREATININE 1.04* 1.05* 1.75*  CALCIUM 8.3* 8.5* 8.5*   GFR: CrCl cannot be calculated (Unknown ideal weight.). Liver Function Tests: Recent Labs  Lab April 19, 2023 0942  AST 81*  ALT 59*  ALKPHOS 657*  BILITOT 6.1*  PROT 3.8*  ALBUMIN 1.9*   No results for input(s): "LIPASE", "AMYLASE" in the last 168 hours. Recent Labs  Lab April 19, 2023 0942  AMMONIA 93*   Coagulation  Profile: No results for input(s): "INR", "PROTIME" in the last 168 hours. Cardiac Enzymes: No results for input(s): "CKTOTAL", "CKMB", "CKMBINDEX", "TROPONINI" in the last 168 hours. BNP (last 3 results) No results for input(s): "PROBNP" in the last 8760 hours. HbA1C: No results for input(s): "HGBA1C" in the last 72 hours. CBG: Recent Labs  Lab 04/19/2023 0931 04-19-2023 0948 04-19-23 1036 04-19-2023 1120 April 19, 2023 1311  GLUCAP 38* 122* 84 76 63*   Lipid Profile: No results for input(s): "CHOL", "HDL", "LDLCALC", "TRIG", "CHOLHDL", "LDLDIRECT" in the last 72 hours. Thyroid Function Tests: No results for input(s): "TSH", "T4TOTAL", "FREET4", "T3FREE", "THYROIDAB" in the last 72 hours. Anemia Panel: No results for input(s): "VITAMINB12", "FOLATE", "FERRITIN", "TIBC", "IRON", "RETICCTPCT" in the last 72 hours. Sepsis Labs: Recent Labs  Lab Apr 19, 2023 1059 04-19-2023 1322  LATICACIDVEN 10.1* 10.0*    Recent Results (  from the past 240 hour(s))  Blood culture (routine x 2)     Status: Abnormal   Collection Time: 05-18-23 10:54 AM   Specimen: BLOOD  Result Value Ref Range Status   Specimen Description BLOOD RIGHT ANTECUBITAL  Final   Special Requests   Final    BOTTLES DRAWN AEROBIC AND ANAEROBIC Blood Culture results may not be optimal due to an inadequate volume of blood received in culture bottles   Culture  Setup Time   Final    GRAM NEGATIVE RODS IN BOTH AEROBIC AND ANAEROBIC BOTTLES CRITICAL RESULT CALLED TO, READ BACK BY AND VERIFIED WITH: J [facility]  05/18/23 MK Performed at [facility], [address], [place], [place] [address]    Culture KLEBSIELLA PNEUMONIAE (A)  Final   Report Status 04/19/2023 FINAL  Final   Organism ID, Bacteria KLEBSIELLA PNEUMONIAE  Final      Susceptibility   Klebsiella pneumoniae - MIC*    AMPICILLIN >=32 RESISTANT Resistant     CEFEPIME <=0.12 SENSITIVE Sensitive     CEFTAZIDIME <=1 SENSITIVE Sensitive     CEFTRIAXONE <=0.25  SENSITIVE Sensitive     CIPROFLOXACIN <=0.25 SENSITIVE Sensitive     GENTAMICIN <=1 SENSITIVE Sensitive     IMIPENEM <=0.25 SENSITIVE Sensitive     TRIMETH/SULFA <=20 SENSITIVE Sensitive     AMPICILLIN/SULBACTAM 8 SENSITIVE Sensitive     PIP/TAZO <=4 SENSITIVE Sensitive ug/mL    * KLEBSIELLA PNEUMONIAE  Blood Culture ID Panel (Reflexed)     Status: Abnormal   Collection Time: 05/18/23 10:54 AM  Result Value Ref Range Status   Enterococcus faecalis NOT DETECTED NOT DETECTED Final   Enterococcus Faecium NOT DETECTED NOT DETECTED Final   Listeria monocytogenes NOT DETECTED NOT DETECTED Final   Staphylococcus species NOT DETECTED NOT DETECTED Final   Staphylococcus aureus (BCID) NOT DETECTED NOT DETECTED Final   Staphylococcus epidermidis NOT DETECTED NOT DETECTED Final   Staphylococcus lugdunensis NOT DETECTED NOT DETECTED Final   Streptococcus species NOT DETECTED NOT DETECTED Final   Streptococcus agalactiae NOT DETECTED NOT DETECTED Final   Streptococcus pneumoniae NOT DETECTED NOT DETECTED Final   Streptococcus pyogenes NOT DETECTED NOT DETECTED Final   A.calcoaceticus-baumannii NOT DETECTED NOT DETECTED Final   Bacteroides fragilis NOT DETECTED NOT DETECTED Final   Enterobacterales DETECTED (A) NOT DETECTED Final    Comment: Enterobacterales represent a large order of gram negative bacteria, not a single organism. CRITICAL RESULT CALLED TO, READ BACK BY AND VERIFIED WITH: J WYLAND,[email or web address]@[email or web address]  05-18-2023 MK    Enterobacter cloacae complex NOT DETECTED NOT DETECTED Final   Escherichia coli NOT DETECTED NOT DETECTED Final   Klebsiella aerogenes NOT DETECTED NOT DETECTED Final   Klebsiella oxytoca NOT DETECTED NOT DETECTED Final   Klebsiella pneumoniae DETECTED (A) NOT DETECTED Final    Comment: CRITICAL RESULT CALLED TO, READ BACK BY AND VERIFIED WITH: J WYLAND,[email or web address]@[email or web address]  05/18/2023 MK    Proteus species NOT DETECTED NOT DETECTED Final   Salmonella species NOT DETECTED NOT DETECTED  Final   Serratia marcescens NOT DETECTED NOT DETECTED Final   Haemophilus influenzae NOT DETECTED NOT DETECTED Final   Neisseria meningitidis NOT DETECTED NOT DETECTED Final   Pseudomonas aeruginosa NOT DETECTED NOT DETECTED Final   Stenotrophomonas maltophilia NOT DETECTED NOT DETECTED Final   Candida albicans NOT DETECTED NOT DETECTED Final   Candida auris NOT DETECTED NOT DETECTED Final   Candida glabrata NOT DETECTED NOT DETECTED Final   Candida krusei NOT DETECTED NOT DETECTED Final   Candida parapsilosis  NOT DETECTED NOT DETECTED Final   Candida tropicalis NOT DETECTED NOT DETECTED Final   Cryptococcus neoformans/gattii NOT DETECTED NOT DETECTED Final   CTX-M ESBL NOT DETECTED NOT DETECTED Final   Carbapenem resistance IMP NOT DETECTED NOT DETECTED Final   Carbapenem resistance KPC NOT DETECTED NOT DETECTED Final   Carbapenem resistance NDM NOT DETECTED NOT DETECTED Final   Carbapenem resist OXA 48 LIKE NOT DETECTED NOT DETECTED Final   Carbapenem resistance VIM NOT DETECTED NOT DETECTED Final    Comment: Performed at [facility], [address], [place], [place] [address]     Radiology Studies: No results found.  Scheduled Meds:  sodium chloride flush  3 mL Intravenous Q12H   Continuous Infusions:  chlorproMAZINE (THORAZINE) 12.5 mg in sodium chloride 0.9 % 25 mL IVPB     dextrose 5% lactated ringers 50 mL/hr at Apr 23, 2023 0041   morphine 5 mg/hr (April 23, 2023 1242)     LOS: 2 days   Hughie Closs, MD [facility]  2023-04-23, 1:05 PM   *Please note that this is a verbal dictation therefore any spelling or grammatical errors are due to the "Dragon Medical One" system interpretation.  Please page via Amion and do not message via secure chat for urgent patient care matters. Secure chat can be used for non urgent patient care matters.  How to contact the [facility] Attending or Consulting provider 7A - 7P or covering provider during after hours 7P -7A, for this  patient?  Check the care team in [facility] and look for a) attending/consulting [facility] provider listed and b) the [facility] team listed. Page or secure chat 7A-7P. Log into [email or web address] and use [facility]'s universal password to access. If you do not have the password, please contact the hospital operator. Locate the [facility] provider you are looking for under [facility] and page to a number that you can be directly reached. If you still have difficulty reaching the provider, please page the [facility] (Director on Call) for the Hospitalists listed on amion for assistance.

## 2023-05-07 NOTE — Plan of Care (Signed)
  Problem: Education: Goal: Knowledge of the prescribed therapeutic regimen will improve Outcome: Not Progressing   Problem: Coping: Goal: Ability to identify and develop effective coping behavior will improve Outcome: Not Progressing   Problem: Clinical Measurements: Goal: Quality of life will improve Outcome: Not Progressing   Problem: Respiratory: Goal: Verbalizations of increased ease of respirations will increase Outcome: Not Progressing   Problem: Role Relationship: Goal: Family's ability to cope with current situation will improve Outcome: Not Progressing Goal: Ability to verbalize concerns, feelings, and thoughts to partner or family member will improve Outcome: Not Progressing   Problem: Pain Management: Goal: Satisfaction with pain management regimen will improve Outcome: Not Progressing   Problem: Education: Goal: Knowledge of General Education information will improve Description: Including pain rating scale, medication(s)/side effects and non-pharmacologic comfort measures Outcome: Not Progressing   Problem: Health Behavior/Discharge Planning: Goal: Ability to manage health-related needs will improve Outcome: Not Progressing   Problem: Clinical Measurements: Goal: Ability to maintain clinical measurements within normal limits will improve Outcome: Not Progressing Goal: Will remain free from infection Outcome: Not Progressing Goal: Diagnostic test results will improve Outcome: Not Progressing Goal: Respiratory complications will improve Outcome: Not Progressing Goal: Cardiovascular complication will be avoided Outcome: Not Progressing   Problem: Activity: Goal: Risk for activity intolerance will decrease Outcome: Not Progressing   Problem: Nutrition: Goal: Adequate nutrition will be maintained Outcome: Not Progressing   Problem: Coping: Goal: Level of anxiety will decrease Outcome: Not Progressing   Problem: Elimination: Goal: Will not  experience complications related to bowel motility Outcome: Not Progressing Goal: Will not experience complications related to urinary retention Outcome: Not Progressing   Problem: Pain Managment: Goal: General experience of comfort will improve Outcome: Not Progressing   Problem: Safety: Goal: Ability to remain free from injury will improve Outcome: Not Progressing   Problem: Skin Integrity: Goal: Risk for impaired skin integrity will decrease Outcome: Not Progressing

## 2023-05-07 NOTE — Progress Notes (Signed)
This chaplain listened reflectively as the Pt. daughters updated the chaplain in the cafeteria on the Pt. EOL journey.  The chaplain understands the Pt. is resting comfortably with less challenges with her breathing. Family is gathering at the bedside with the added background of the Pt. favorite music.  The Pt. daughter confirmed Father Ree Kida visited on Monday night. The family is appreciative of Palliative Care's involvement.  The chaplain offered F/U spiritual care as needed.  Chaplain Stephanie Acre [phone]

## 2023-05-07 NOTE — Progress Notes (Addendum)
Palliative Medicine Inpatient Follow Up Note HPI: Chelsea Keller is a 88 y.o. female with medical history significant of hypertension, CKD 3B, diastolic CHF, venous insufficiency, anemia, recurrent DVT/PE, obesity, mood disorder, gout presenting after being found altered.    The palliative medicine team has been asked to get involved to assist with comfort mediated care.  Today's Discussion 05/01/23  *Please note that this is a verbal dictation therefore any spelling or grammatical errors are due to the "Dragon Medical One" system interpretation.  Chart reviewed inclusive of vital signs, progress notes, laboratory results, and diagnostic images.   I met with Chelsea Keller at bedside this morning, she is somnolent and not arousable. She has cool digits. She has long episodes of apnea. She is in the active phases of dying.   Patients family not present at bedside this morning though a phone update will be provided.  Anticipate hours to days.   PMT will continue to provide ongoing support.  ____________________________ Addendum:  I met with patients four daughters, grandson, and son in law at bedside.   Discussed that a niece is traveling here tonight.  Shared concern in the setting of patient continuing to receive mIVF as they may cause more harm as opposed to benefit. Patients daughter feels strongly that we need to continue them. I shared we can continue them until patients niece arrives and reassess thereafter.    Support provided.   Objective Assessment: Vital Signs Vitals:   04/19/23 0609 04/19/23 2017  BP: (!) 76/39 (!) 72/38  Pulse: 70 79  Resp: 20 12  Temp:  (!) 97.5 F (36.4 C)  SpO2: 98% 99%    Intake/Output Summary (Last 24 hours) at 05/01/2023 0954 Last data filed at 05-01-23 1610 Gross per 24 hour  Intake --  Output 0 ml  Net 0 ml   Gen: Elderly Caucasian female chronically ill-appearing HEENT: Dry mucous membranes CV: Regular rate and rhythm PULM: On  3LPM, abdominal breathing ABD: soft/nontender EXT: Bilateral UE edema,  pedal edema, cool extremties Neuro: Oriented to self and place but not clear on situation  SUMMARY OF RECOMMENDATIONS   DNAR/DNI   Comfort Care  Continue morphine gtt --> No titration needed at this time  Anticipate in hospital death   Ongoing PMT support  Time: 24 ______________________________________________________________________________________ Lamarr Lulas [place] Palliative Medicine Team Team Cell Phone: [phone] Please utilize secure chat with additional questions, if there is no response within 30 minutes please call the above phone number  Palliative Medicine Team providers are available by phone from 7am to 7pm daily and can be reached through the team cell phone.  Should this patient require assistance outside of these hours, please call the patient's attending physician.

## 2023-05-07 NOTE — Progress Notes (Addendum)
Patient passed.  Family has been in room all day. Patient's daughter came to nurses station and said she thought patient had died.    Time of death confirmed by this nurse and nurse Lila at [phone].    Dr. Jacqulyn Bath informed via page and return call from him.    Patient's family currently in room spending time with patient.    Patient was on morphine gtt.  Wasted 80 ml/mg morphine in pixis with nurse Lila.

## 2023-05-07 DEATH — deceased
